# Patient Record
Sex: Male | Born: 1945 | Race: White | Hispanic: No | Marital: Married | Smoking: Never smoker
Health system: Southern US, Community
[De-identification: ages and names within clinical notes are randomized; demographics above are authoritative.]

## PROBLEM LIST (undated history)

## (undated) DIAGNOSIS — M51369 Other intervertebral disc degeneration, lumbar region without mention of lumbar back pain or lower extremity pain: Secondary | ICD-10-CM

## (undated) DIAGNOSIS — N480 Leukoplakia of penis: Secondary | ICD-10-CM

## (undated) DIAGNOSIS — N35914 Unspecified anterior urethral stricture, male: Secondary | ICD-10-CM

## (undated) DIAGNOSIS — Z950 Presence of cardiac pacemaker: Secondary | ICD-10-CM

## (undated) DIAGNOSIS — I442 Atrioventricular block, complete: Secondary | ICD-10-CM

## (undated) DIAGNOSIS — G2581 Restless legs syndrome: Secondary | ICD-10-CM

## (undated) DIAGNOSIS — R310 Gross hematuria: Secondary | ICD-10-CM

## (undated) DIAGNOSIS — I2699 Other pulmonary embolism without acute cor pulmonale: Secondary | ICD-10-CM

## (undated) DIAGNOSIS — I82409 Acute embolism and thrombosis of unspecified deep veins of unspecified lower extremity: Secondary | ICD-10-CM

## (undated) DIAGNOSIS — G629 Polyneuropathy, unspecified: Secondary | ICD-10-CM

## (undated) DIAGNOSIS — F32A Depression, unspecified: Secondary | ICD-10-CM

## (undated) DIAGNOSIS — E785 Hyperlipidemia, unspecified: Secondary | ICD-10-CM

## (undated) DIAGNOSIS — N471 Phimosis: Secondary | ICD-10-CM

## (undated) DIAGNOSIS — M5126 Other intervertebral disc displacement, lumbar region: Secondary | ICD-10-CM

## (undated) DIAGNOSIS — I10 Essential (primary) hypertension: Secondary | ICD-10-CM

## (undated) DIAGNOSIS — K648 Other hemorrhoids: Secondary | ICD-10-CM

## (undated) DIAGNOSIS — I951 Orthostatic hypotension: Secondary | ICD-10-CM

## (undated) DIAGNOSIS — K2960 Other gastritis without bleeding: Secondary | ICD-10-CM

## (undated) DIAGNOSIS — G4730 Sleep apnea, unspecified: Secondary | ICD-10-CM

## (undated) DIAGNOSIS — F329 Major depressive disorder, single episode, unspecified: Secondary | ICD-10-CM

## (undated) DIAGNOSIS — K219 Gastro-esophageal reflux disease without esophagitis: Secondary | ICD-10-CM

## (undated) DIAGNOSIS — M5136 Other intervertebral disc degeneration, lumbar region: Secondary | ICD-10-CM

## (undated) DIAGNOSIS — F419 Anxiety disorder, unspecified: Secondary | ICD-10-CM

## (undated) DIAGNOSIS — E119 Type 2 diabetes mellitus without complications: Secondary | ICD-10-CM

## (undated) DIAGNOSIS — K2210 Ulcer of esophagus without bleeding: Secondary | ICD-10-CM

## (undated) DIAGNOSIS — M1990 Unspecified osteoarthritis, unspecified site: Secondary | ICD-10-CM

## (undated) HISTORY — PX: OTHER SURGICAL HISTORY: SHX169

## (undated) HISTORY — DX: Leukoplakia of penis: N48.0

## (undated) HISTORY — PX: EYE SURGERY: SHX253

## (undated) HISTORY — PX: JOINT REPLACEMENT: SHX530

## (undated) HISTORY — DX: Gross hematuria: R31.0

## (undated) HISTORY — DX: Atrioventricular block, complete: I44.2

## (undated) HISTORY — PX: PILONIDAL CYST EXCISION: SHX744

## (undated) HISTORY — DX: Unspecified anterior urethral stricture, male: N35.914

## (undated) HISTORY — PX: CARDIAC CATHETERIZATION: SHX172

## (undated) HISTORY — PX: UPPER GI ENDOSCOPY: SHX6162

## (undated) HISTORY — PX: TOTAL KNEE ARTHROPLASTY: SHX125

## (undated) HISTORY — PX: CATARACT EXTRACTION W/ INTRAOCULAR LENS  IMPLANT, BILATERAL: SHX1307

## (undated) HISTORY — DX: Phimosis: N47.1

---
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
## 2002-01-23 HISTORY — PX: TOTAL HIP ARTHROPLASTY: SHX124

## 2004-01-24 HISTORY — PX: TOTAL HIP ARTHROPLASTY: SHX124

## 2004-01-29 ENCOUNTER — Ambulatory Visit: Payer: Self-pay | Admitting: Gastroenterology

## 2004-05-27 ENCOUNTER — Encounter: Admission: RE | Admit: 2004-05-27 | Discharge: 2004-05-27 | Payer: Self-pay | Admitting: Family Medicine

## 2005-12-28 ENCOUNTER — Other Ambulatory Visit: Payer: Self-pay

## 2006-01-01 ENCOUNTER — Inpatient Hospital Stay: Payer: Self-pay | Admitting: Specialist

## 2006-11-01 ENCOUNTER — Ambulatory Visit: Payer: Self-pay | Admitting: Cardiovascular Disease

## 2010-02-11 ENCOUNTER — Encounter
Admission: RE | Admit: 2010-02-11 | Discharge: 2010-02-11 | Payer: Self-pay | Source: Home / Self Care | Attending: Specialist | Admitting: Specialist

## 2010-03-01 ENCOUNTER — Ambulatory Visit: Payer: Self-pay | Admitting: Specialist

## 2010-03-08 ENCOUNTER — Ambulatory Visit: Payer: Self-pay | Admitting: Specialist

## 2011-03-21 DIAGNOSIS — H2510 Age-related nuclear cataract, unspecified eye: Secondary | ICD-10-CM | POA: Diagnosis not present

## 2011-08-01 DIAGNOSIS — R42 Dizziness and giddiness: Secondary | ICD-10-CM | POA: Diagnosis not present

## 2011-08-01 DIAGNOSIS — E7800 Pure hypercholesterolemia, unspecified: Secondary | ICD-10-CM | POA: Diagnosis not present

## 2011-08-01 DIAGNOSIS — R0602 Shortness of breath: Secondary | ICD-10-CM | POA: Diagnosis not present

## 2011-08-01 DIAGNOSIS — E8881 Metabolic syndrome: Secondary | ICD-10-CM | POA: Diagnosis not present

## 2011-08-01 DIAGNOSIS — R072 Precordial pain: Secondary | ICD-10-CM | POA: Diagnosis not present

## 2011-08-01 DIAGNOSIS — I10 Essential (primary) hypertension: Secondary | ICD-10-CM | POA: Diagnosis not present

## 2011-08-03 DIAGNOSIS — R079 Chest pain, unspecified: Secondary | ICD-10-CM | POA: Diagnosis not present

## 2011-08-03 DIAGNOSIS — E785 Hyperlipidemia, unspecified: Secondary | ICD-10-CM | POA: Diagnosis not present

## 2011-08-03 DIAGNOSIS — I10 Essential (primary) hypertension: Secondary | ICD-10-CM | POA: Diagnosis not present

## 2011-08-03 DIAGNOSIS — R011 Cardiac murmur, unspecified: Secondary | ICD-10-CM | POA: Diagnosis not present

## 2011-08-09 DIAGNOSIS — R072 Precordial pain: Secondary | ICD-10-CM | POA: Diagnosis not present

## 2011-08-10 DIAGNOSIS — I10 Essential (primary) hypertension: Secondary | ICD-10-CM | POA: Diagnosis not present

## 2011-08-10 DIAGNOSIS — G4730 Sleep apnea, unspecified: Secondary | ICD-10-CM | POA: Diagnosis not present

## 2011-08-10 DIAGNOSIS — E785 Hyperlipidemia, unspecified: Secondary | ICD-10-CM | POA: Diagnosis not present

## 2011-08-10 DIAGNOSIS — R609 Edema, unspecified: Secondary | ICD-10-CM | POA: Diagnosis not present

## 2011-09-07 DIAGNOSIS — I10 Essential (primary) hypertension: Secondary | ICD-10-CM | POA: Diagnosis not present

## 2011-09-07 DIAGNOSIS — E7800 Pure hypercholesterolemia, unspecified: Secondary | ICD-10-CM | POA: Diagnosis not present

## 2011-09-07 DIAGNOSIS — E119 Type 2 diabetes mellitus without complications: Secondary | ICD-10-CM | POA: Diagnosis not present

## 2012-03-04 DIAGNOSIS — I10 Essential (primary) hypertension: Secondary | ICD-10-CM | POA: Diagnosis not present

## 2012-03-04 DIAGNOSIS — Z0000 Encounter for general adult medical examination without abnormal findings: Secondary | ICD-10-CM | POA: Diagnosis not present

## 2012-03-04 DIAGNOSIS — E8881 Metabolic syndrome: Secondary | ICD-10-CM | POA: Diagnosis not present

## 2012-03-04 DIAGNOSIS — E785 Hyperlipidemia, unspecified: Secondary | ICD-10-CM | POA: Diagnosis not present

## 2012-03-14 DIAGNOSIS — H43819 Vitreous degeneration, unspecified eye: Secondary | ICD-10-CM | POA: Diagnosis not present

## 2012-03-14 DIAGNOSIS — Z961 Presence of intraocular lens: Secondary | ICD-10-CM | POA: Diagnosis not present

## 2012-08-20 DIAGNOSIS — M545 Low back pain: Secondary | ICD-10-CM | POA: Diagnosis not present

## 2012-08-20 DIAGNOSIS — R350 Frequency of micturition: Secondary | ICD-10-CM | POA: Diagnosis not present

## 2012-08-20 DIAGNOSIS — N390 Urinary tract infection, site not specified: Secondary | ICD-10-CM | POA: Diagnosis not present

## 2012-09-02 DIAGNOSIS — I10 Essential (primary) hypertension: Secondary | ICD-10-CM | POA: Diagnosis not present

## 2012-09-02 DIAGNOSIS — N390 Urinary tract infection, site not specified: Secondary | ICD-10-CM | POA: Diagnosis not present

## 2012-09-02 DIAGNOSIS — E785 Hyperlipidemia, unspecified: Secondary | ICD-10-CM | POA: Diagnosis not present

## 2012-10-02 ENCOUNTER — Ambulatory Visit: Payer: Self-pay

## 2012-10-02 DIAGNOSIS — Z96649 Presence of unspecified artificial hip joint: Secondary | ICD-10-CM | POA: Diagnosis not present

## 2012-10-02 DIAGNOSIS — R319 Hematuria, unspecified: Secondary | ICD-10-CM | POA: Diagnosis not present

## 2012-10-02 DIAGNOSIS — R310 Gross hematuria: Secondary | ICD-10-CM | POA: Diagnosis not present

## 2012-10-14 DIAGNOSIS — R300 Dysuria: Secondary | ICD-10-CM | POA: Diagnosis not present

## 2012-10-14 DIAGNOSIS — R319 Hematuria, unspecified: Secondary | ICD-10-CM | POA: Diagnosis not present

## 2012-10-14 DIAGNOSIS — N478 Other disorders of prepuce: Secondary | ICD-10-CM | POA: Diagnosis not present

## 2012-10-29 ENCOUNTER — Ambulatory Visit: Payer: Self-pay | Admitting: Specialist

## 2012-10-29 DIAGNOSIS — M25569 Pain in unspecified knee: Secondary | ICD-10-CM | POA: Diagnosis not present

## 2012-10-29 DIAGNOSIS — M2550 Pain in unspecified joint: Secondary | ICD-10-CM | POA: Diagnosis not present

## 2012-10-29 DIAGNOSIS — M25559 Pain in unspecified hip: Secondary | ICD-10-CM | POA: Diagnosis not present

## 2012-10-29 DIAGNOSIS — Z96649 Presence of unspecified artificial hip joint: Secondary | ICD-10-CM | POA: Diagnosis not present

## 2012-12-10 DIAGNOSIS — Z23 Encounter for immunization: Secondary | ICD-10-CM | POA: Diagnosis not present

## 2012-12-31 DIAGNOSIS — R319 Hematuria, unspecified: Secondary | ICD-10-CM | POA: Diagnosis not present

## 2012-12-31 DIAGNOSIS — N478 Other disorders of prepuce: Secondary | ICD-10-CM | POA: Diagnosis not present

## 2013-03-13 DIAGNOSIS — M1710 Unilateral primary osteoarthritis, unspecified knee: Secondary | ICD-10-CM | POA: Diagnosis not present

## 2013-03-27 DIAGNOSIS — R109 Unspecified abdominal pain: Secondary | ICD-10-CM | POA: Diagnosis not present

## 2013-04-07 ENCOUNTER — Inpatient Hospital Stay (HOSPITAL_COMMUNITY): Payer: BC Managed Care – PPO

## 2013-04-07 ENCOUNTER — Inpatient Hospital Stay (HOSPITAL_COMMUNITY)
Admission: AD | Admit: 2013-04-07 | Discharge: 2013-04-11 | DRG: 176 | Disposition: A | Discharge status: Home / Self Care | Payer: BC Managed Care – PPO | Source: Other Acute Inpatient Hospital | Attending: Pulmonary Disease | Admitting: Pulmonary Disease

## 2013-04-07 ENCOUNTER — Emergency Department: Payer: Self-pay | Admitting: Emergency Medicine

## 2013-04-07 DIAGNOSIS — I2699 Other pulmonary embolism without acute cor pulmonale: Principal | ICD-10-CM | POA: Diagnosis present

## 2013-04-07 DIAGNOSIS — R0789 Other chest pain: Secondary | ICD-10-CM | POA: Diagnosis not present

## 2013-04-07 DIAGNOSIS — Z79899 Other long term (current) drug therapy: Secondary | ICD-10-CM | POA: Diagnosis not present

## 2013-04-07 DIAGNOSIS — R000 Tachycardia, unspecified: Secondary | ICD-10-CM

## 2013-04-07 DIAGNOSIS — Z96649 Presence of unspecified artificial hip joint: Secondary | ICD-10-CM | POA: Diagnosis not present

## 2013-04-07 DIAGNOSIS — I517 Cardiomegaly: Secondary | ICD-10-CM | POA: Diagnosis not present

## 2013-04-07 DIAGNOSIS — I82409 Acute embolism and thrombosis of unspecified deep veins of unspecified lower extremity: Secondary | ICD-10-CM

## 2013-04-07 DIAGNOSIS — I498 Other specified cardiac arrhythmias: Secondary | ICD-10-CM | POA: Diagnosis not present

## 2013-04-07 DIAGNOSIS — M1710 Unilateral primary osteoarthritis, unspecified knee: Secondary | ICD-10-CM | POA: Diagnosis present

## 2013-04-07 DIAGNOSIS — R0989 Other specified symptoms and signs involving the circulatory and respiratory systems: Secondary | ICD-10-CM | POA: Diagnosis not present

## 2013-04-07 DIAGNOSIS — R0602 Shortness of breath: Secondary | ICD-10-CM | POA: Diagnosis not present

## 2013-04-07 DIAGNOSIS — I10 Essential (primary) hypertension: Secondary | ICD-10-CM | POA: Diagnosis not present

## 2013-04-07 DIAGNOSIS — Z6841 Body Mass Index (BMI) 40.0 and over, adult: Secondary | ICD-10-CM

## 2013-04-07 DIAGNOSIS — R0902 Hypoxemia: Secondary | ICD-10-CM | POA: Diagnosis not present

## 2013-04-07 DIAGNOSIS — IMO0002 Reserved for concepts with insufficient information to code with codable children: Secondary | ICD-10-CM

## 2013-04-07 DIAGNOSIS — E875 Hyperkalemia: Secondary | ICD-10-CM | POA: Diagnosis present

## 2013-04-07 HISTORY — DX: Anxiety disorder, unspecified: F41.9

## 2013-04-07 HISTORY — DX: Depression, unspecified: F32.A

## 2013-04-07 HISTORY — DX: Other intervertebral disc degeneration, lumbar region: M51.36

## 2013-04-07 HISTORY — DX: Essential (primary) hypertension: I10

## 2013-04-07 HISTORY — DX: Other intervertebral disc degeneration, lumbar region without mention of lumbar back pain or lower extremity pain: M51.369

## 2013-04-07 HISTORY — DX: Acute embolism and thrombosis of unspecified deep veins of unspecified lower extremity: I82.409

## 2013-04-07 HISTORY — DX: Unspecified osteoarthritis, unspecified site: M19.90

## 2013-04-07 HISTORY — DX: Major depressive disorder, single episode, unspecified: F32.9

## 2013-04-07 HISTORY — DX: Other intervertebral disc displacement, lumbar region: M51.26

## 2013-04-07 HISTORY — DX: Other pulmonary embolism without acute cor pulmonale: I26.99

## 2013-04-07 LAB — CBC
HCT: 43.6 % (ref 40.0–52.0)
HGB: 14.8 g/dL (ref 13.0–18.0)
MCH: 31.9 pg (ref 26.0–34.0)
MCHC: 33.9 g/dL (ref 32.0–36.0)
MCV: 94 fL (ref 80–100)
Platelet: 131 10*3/uL — ABNORMAL LOW (ref 150–440)
RBC: 4.64 10*6/uL (ref 4.40–5.90)
RDW: 13.3 % (ref 11.5–14.5)
WBC: 9.4 10*3/uL (ref 3.8–10.6)

## 2013-04-07 LAB — CBC WITH DIFFERENTIAL/PLATELET
BASOS PCT: 0 % (ref 0–1)
Basophils Absolute: 0 10*3/uL (ref 0.0–0.1)
Eosinophils Absolute: 0.2 10*3/uL (ref 0.0–0.7)
Eosinophils Relative: 3 % (ref 0–5)
HEMATOCRIT: 39.4 % (ref 39.0–52.0)
Hemoglobin: 13.8 g/dL (ref 13.0–17.0)
LYMPHS PCT: 22 % (ref 12–46)
Lymphs Abs: 1.6 10*3/uL (ref 0.7–4.0)
MCH: 31.9 pg (ref 26.0–34.0)
MCHC: 35 g/dL (ref 30.0–36.0)
MCV: 91.2 fL (ref 78.0–100.0)
MONO ABS: 0.5 10*3/uL (ref 0.1–1.0)
MONOS PCT: 7 % (ref 3–12)
Neutro Abs: 5.1 10*3/uL (ref 1.7–7.7)
Neutrophils Relative %: 68 % (ref 43–77)
Platelets: 139 10*3/uL — ABNORMAL LOW (ref 150–400)
RBC: 4.32 MIL/uL (ref 4.22–5.81)
RDW: 13.5 % (ref 11.5–15.5)
WBC: 7.5 10*3/uL (ref 4.0–10.5)

## 2013-04-07 LAB — HEPATIC FUNCTION PANEL
ALT: 16 U/L (ref 0–53)
AST: 23 U/L (ref 0–37)
Albumin: 3.8 g/dL (ref 3.5–5.2)
Alkaline Phosphatase: 38 U/L — ABNORMAL LOW (ref 39–117)
BILIRUBIN TOTAL: 0.7 mg/dL (ref 0.3–1.2)
Total Protein: 6.6 g/dL (ref 6.0–8.3)

## 2013-04-07 LAB — PRO B NATRIURETIC PEPTIDE: Pro B Natriuretic peptide (BNP): 1217 pg/mL — ABNORMAL HIGH (ref 0–125)

## 2013-04-07 LAB — COMPREHENSIVE METABOLIC PANEL
ALK PHOS: 45 U/L
ALT: 22 U/L (ref 12–78)
ALT: 15 U/L (ref 0–53)
AST: 22 U/L (ref 0–37)
Albumin: 4.3 g/dL (ref 3.4–5.0)
Albumin: 3.7 g/dL (ref 3.5–5.2)
Alkaline Phosphatase: 38 U/L — ABNORMAL LOW (ref 39–117)
Anion Gap: 4 — ABNORMAL LOW (ref 7–16)
BILIRUBIN TOTAL: 0.7 mg/dL (ref 0.3–1.2)
BUN: 28 mg/dL — ABNORMAL HIGH (ref 7–18)
BUN: 21 mg/dL (ref 6–23)
Bilirubin,Total: 0.5 mg/dL (ref 0.2–1.0)
CALCIUM: 8.8 mg/dL (ref 8.5–10.1)
CHLORIDE: 105 mmol/L (ref 98–107)
CO2: 24 meq/L (ref 19–32)
CREATININE: 0.97 mg/dL (ref 0.50–1.35)
Calcium: 9.2 mg/dL (ref 8.4–10.5)
Chloride: 99 mEq/L (ref 96–112)
Co2: 28 mmol/L (ref 21–32)
Creatinine: 1.26 mg/dL (ref 0.60–1.30)
EGFR (African American): >60
GFR CALC NON AF AMER: 59 — AB
GFR calc Af Amer: >90 mL/min (ref >90)
GFR, EST NON AFRICAN AMERICAN: 84 mL/min — AB (ref >90)
Glucose, Bld: 134 mg/dL — ABNORMAL HIGH (ref 70–99)
Glucose: 126 mg/dL — ABNORMAL HIGH (ref 65–99)
OSMOLALITY: 281 (ref 275–301)
Potassium: 4.2 mmol/L (ref 3.5–5.1)
Potassium: 4 mEq/L (ref 3.7–5.3)
SGOT(AST): 28 U/L (ref 15–37)
Sodium: 137 mmol/L (ref 136–145)
Sodium: 139 mEq/L (ref 137–147)
TOTAL PROTEIN: 7.6 g/dL (ref 6.4–8.2)
Total Protein: 6.6 g/dL (ref 6.0–8.3)

## 2013-04-07 LAB — ANTITHROMBIN III: AntiThromb III Func: 81 % (ref 75–120)

## 2013-04-07 LAB — APTT
ACTIVATED PTT: 25.8 s (ref 23.6–35.9)
APTT: 105 s — AB (ref 24–37)

## 2013-04-07 LAB — PROTIME-INR
INR: 0.9
INR: 1.1 (ref 0.00–1.49)
PROTHROMBIN TIME: 12.4 s (ref 11.5–14.7)
PROTHROMBIN TIME: 14 s (ref 11.6–15.2)

## 2013-04-07 LAB — TROPONIN I
Troponin I: 0.38 ng/mL (ref <0.30)
Troponin-I: 0.15 ng/mL — ABNORMAL HIGH

## 2013-04-07 LAB — GLUCOSE, CAPILLARY: Glucose-Capillary: 162 mg/dL — ABNORMAL HIGH (ref 70–99)

## 2013-04-07 LAB — MRSA PCR SCREENING: MRSA BY PCR: NEGATIVE

## 2013-04-07 LAB — HEPARIN LEVEL (UNFRACTIONATED): HEPARIN UNFRACTIONATED: 0.82 [IU]/mL — AB (ref 0.30–0.70)

## 2013-04-07 MED ORDER — GABAPENTIN 600 MG PO TABS
300.0000 mg | ORAL_TABLET | Freq: Three times a day (TID) | ORAL | Status: DC
  Filled 2013-04-07: qty 0.5

## 2013-04-07 MED ORDER — FOLIC ACID 1 MG PO TABS
1.0000 mg | ORAL_TABLET | Freq: Every day | ORAL | Status: DC
  Administered 2013-04-07 – 2013-04-11 (×5): 1 mg via ORAL
  Filled 2013-04-07 (×6): qty 1

## 2013-04-07 MED ORDER — HYDROCODONE-ACETAMINOPHEN 5-325 MG PO TABS
1.0000 | ORAL_TABLET | Freq: Four times a day (QID) | ORAL | Status: DC | PRN
  Administered 2013-04-07 – 2013-04-09 (×2): 1 via ORAL
  Filled 2013-04-07 (×3): qty 1

## 2013-04-07 MED ORDER — ASPIRIN 81 MG PO CHEW
81.0000 mg | CHEWABLE_TABLET | Freq: Every day | ORAL | Status: DC
  Administered 2013-04-07 – 2013-04-11 (×5): 81 mg via ORAL
  Filled 2013-04-07 (×5): qty 1

## 2013-04-07 MED ORDER — QUETIAPINE FUMARATE ER 50 MG PO TB24
150.0000 mg | ORAL_TABLET | Freq: Every day | ORAL | Status: DC
  Administered 2013-04-07 – 2013-04-08 (×2): 150 mg via ORAL
  Filled 2013-04-07 (×5): qty 3

## 2013-04-07 MED ORDER — HEPARIN (PORCINE) IN NACL 100-0.45 UNIT/ML-% IJ SOLN
1800.0000 [IU]/h | INTRAMUSCULAR | Status: AC
  Administered 2013-04-07: 1900 [IU]/h via INTRAVENOUS
  Administered 2013-04-09: 1600 [IU]/h via INTRAVENOUS
  Administered 2013-04-09 – 2013-04-10 (×2): 1800 [IU]/h via INTRAVENOUS
  Filled 2013-04-07 (×7): qty 250

## 2013-04-07 MED ORDER — GABAPENTIN 300 MG PO CAPS
300.0000 mg | ORAL_CAPSULE | Freq: Three times a day (TID) | ORAL | Status: DC
  Administered 2013-04-07 – 2013-04-11 (×12): 300 mg via ORAL
  Filled 2013-04-07 (×14): qty 1

## 2013-04-07 MED ORDER — ADULT MULTIVITAMIN W/MINERALS CH
1.0000 | ORAL_TABLET | Freq: Every day | ORAL | Status: DC
  Administered 2013-04-07 – 2013-04-11 (×5): 1 via ORAL
  Filled 2013-04-07 (×5): qty 1

## 2013-04-07 MED ORDER — VITAMIN B-1 100 MG PO TABS
100.0000 mg | ORAL_TABLET | Freq: Every day | ORAL | Status: DC
  Administered 2013-04-07 – 2013-04-11 (×5): 100 mg via ORAL
  Filled 2013-04-07 (×5): qty 1

## 2013-04-07 NOTE — Progress Notes (Signed)
ANTICOAGULATION CONSULT NOTE - Initial Consult  Pharmacy Consult for heparin Indication: pulmonary embolus  Not on File  Patient Measurements:   Heparin Dosing Weight: 108kg  Vital Signs: Temp: 99.3 F (37.4 C) (03/16 1900) Temp src: Oral (03/16 1900) BP: 93/78 mmHg (03/16 2000) Pulse Rate: 99 (03/16 2000)  Labs:  Recent Labs  04/07/13 2024  HGB 13.8  HCT 39.4  PLT 139*  HEPARINUNFRC 0.82*    CrCl is unknown because no creatinine reading has been taken and the patient has no height on file.   Medical History: No past medical history on file.  Medications:  Prescriptions prior to admission  Medication Sig Dispense Refill  . aspirin 81 MG tablet Take 81 mg by mouth daily.      . AZOR 10-40 MG per tablet Take 1 tablet by mouth daily.      Marland Kitchen gabapentin (NEURONTIN) 300 MG capsule Take 1 capsule by mouth 3 (three) times daily.      Marland Kitchen HYDROcodone-acetaminophen (NORCO/VICODIN) 5-325 MG per tablet Take 1 tablet by mouth 2 (two) times daily as needed.      . hyoscyamine (ANASPAZ) 0.125 MG TBDP disintergrating tablet Take 1 tablet by mouth 4 (four) times daily as needed.      . meloxicam (MOBIC) 15 MG tablet Take 1 tablet by mouth daily.      . Multiple Vitamin (MULTIVITAMIN) tablet Take 1 tablet by mouth daily.      Marland Kitchen omeprazole (PRILOSEC) 40 MG capsule Take 1 capsule by mouth daily.      . SEROQUEL XR 150 MG 24 hr tablet Take 1 tablet by mouth daily.      . simvastatin (ZOCOR) 20 MG tablet Take 1 tablet by mouth daily.      . Venlafaxine HCl 225 MG TB24 Take 1 tablet by mouth daily.      . VESICARE 10 MG tablet Take 1 tablet by mouth daily.        Assessment: 89 yom presented to [facility] with SOB. Found to have a saddle PE with evidence of R heart strain. Initiated on aggressive heparin dosing at [facility] and transferred to [facility]. Unclear exactly when heparin was started based on documentation from [facility] but appears to have been started between 1pm and 2pm. An approximate 6 hour  heparin level was checked upon arrival which resulted as supratherapeutic at 0.82. No bleeding noted. H/H is WNL but plts are slightly low. This is similar to his baseline labs checked at [facility] (H/H 14.8/43.6, plts 131, INR 0.9).   Goal of Therapy:  Heparin level 0.3-0.7 units/ml Monitor platelets by anticoagulation protocol: Yes   Plan:  1. Reduce heparin gtt to 1900 units/hr 2. Check an 8 hour heparin level 3. Daily heparin level and CBC 4. F/u plans for oral anticoagulation  Jeremian Whitby, Rande Lawman 04/07/2013,9:50 PM

## 2013-04-07 NOTE — H&P (Addendum)
PULMONARY / CRITICAL CARE MEDICINE   Name: Nicholas [name] MRN: [id number] DOB: 11-Sep-1945    ADMISSION DATE:  04/07/2013 CONSULTATION DATE:  04/07/2013  REFERRING MD :  [facility] PRIMARY SERVICE: PCCM  CHIEF COMPLAINT:  SOB  BRIEF PATIENT DESCRIPTION: 68 y.o. M transferred to [facility] ICU from [facility] with PE. PCCM to admit for catheter directed tPA that was recommended by Dr. Jeronimo Norma.  Patient refused it.  SIGNIFICANT EVENTS / STUDIES:  3/16 >>> presented to [facility] with SOB, found to have PE, transferred to [facility] ICU.  CTA showed large acute PE on R side with saddle embolus and filling defect, smaller PE in RLL, evidence of right heart strain.  LINES / TUBES: PIV  CULTURES: None  ANTIBIOTICS: None  HISTORY OF PRESENT ILLNESS:  Mr. [name] is a 68 y.o. M who was seen at [facility] after he suddenly developed SOB while playing golf earlier today.  Workup in ED revealed acute PE.  Pt transferred to [facility] ICU. Pt denies any recent plane rides, long car trips, long periods of immobility.  No known clotting disorders, no family history of clotting disorders.  No history of Cancer.  No recent weight loss. States that his SOB is somewhat improved but not fully.  Denies chest pain, wheezing, hemoptysis, leg pain/swelling, abdominal pain, N/V/D.   PAST MEDICAL HISTORY :  No past medical history on file. Chronic hip and knee pain and chronic knee arthritis. No past surgical history on file. Bilateral hip replacement. Prior to Admission medications   Not on File  COX2 inhibitor, gabapentin, seroquel and "pain pill" Not on File  FAMILY HISTORY:  No family history on file. Negative for blood clots. SOCIAL HISTORY:  has no tobacco, alcohol, and drug history on file. Never smoked, occasional alcohol (but his family seems to disagree but not directly) and no drug use.  REVIEW OF SYSTEMS:  Negative except as stated in HPI.  SUBJECTIVE: SOB is improved with O2.  Having some mild chest tightness but no true chest pain.  VITAL  SIGNS:  HR 100, BP 137/88, sat of 96% on 5 liters, RR 16-20.  Afebrile. HEMODYNAMICS:   VENTILATOR SETTINGS:   INTAKE / OUTPUT: Intake/Output   None     PHYSICAL EXAMINATION: General: Pleasant WDWN male, resting in bed, in NAD. Neuro: A&O x 3, non-focal.  HEENT: [place]/AT. PERRL, sclerae anicteric. Cardiovascular: RRR, no M/R/G.  Lungs: Respirations even and unlabored.  CTA bilaterally, No W/R/R.  Abdomen: BS x 4, soft, NT/ND.  Musculoskeletal: No gross deformities, no edema.  Skin: Intact, warm, no rashes.  LABS:  CBC No results found for this basename: WBC, HGB, HCT, PLT,  in the last 168 hours Coag's No results found for this basename: APTT, INR,  in the last 168 hours BMET No results found for this basename: NA, K, CL, CO2, BUN, CREATININE, GLUCOSE,  in the last 168 hours Electrolytes No results found for this basename: CALCIUM, MG, PHOS,  in the last 168 hours Sepsis Markers No results found for this basename: LATICACIDVEN, PROCALCITON, O2SATVEN,  in the last 168 hours ABG No results found for this basename: PHART, PCO2ART, PO2ART,  in the last 168 hours Liver Enzymes No results found for this basename: AST, ALT, ALKPHOS, BILITOT, ALBUMIN,  in the last 168 hours Cardiac Enzymes No results found for this basename: TROPONINI, PROBNP,  in the last 168 hours Glucose No results found for this basename: GLUCAP,  in the last 168 hours  Imaging No results found.  ASSESSMENT / PLAN:  PULMONARY A: Acute PE.  Treatment options discussed at length with patient, he does not want catheter directed tPA, wishes only for anticoagulation since he feels so much better already and wishes for coumadin over xeralto. P:   - Full anticoagulation as below. - Supplemental O2 to maintain SpO2 > 93%. - IS and flutter valve. - LE dopplers. - Send hypercoagulability panel but will need to be repeated as patient is already on heparin.  CARDIOVASCULAR A: Troponin Bump (0.15 at  [facility]) P:  - Trend troponins. - Echo. - EKG. - ASA and heparin.  RENAL A: At risk for contrast induced nephropathy. P:   - Monitor BMET. - Replace electrolytes as indicated.  GASTROINTESTINAL A:  Diet P:   - SUP not indicated. - Heart healthy diet. - F/u LFT's  HEMATOLOGIC A: DVT prophylaxis P:  - Heparin drip x 2 days, then Coumadin bridge for at least 3 days. - Monitor CBC.  INFECTIOUS A:  No acute issues. P:   - Monitor WBC's/fever curve.  ENDOCRINE A:   No acute issues.  No history of diabetes. P:   - Monitor.  NEUROLOGIC A:   No acute issues.  Denies alcohol use emphatically. P:   - Monitor for signs of etoh withdrawal. - Will give thiamine, folate and MVI regardless.  Montey Hora, PA - [facility] Pgr: [phone]  or [phone]  I have personally obtained a history, examined the patient, evaluated laboratory and imaging results, formulated the assessment and plan and placed orders.  Rush Farmer, M.D. [facility] Pulmonary/Critical Care Medicine. Pager: [phone]. After hours pager: [phone].

## 2013-04-08 ENCOUNTER — Inpatient Hospital Stay (HOSPITAL_COMMUNITY): Payer: BC Managed Care – PPO

## 2013-04-08 DIAGNOSIS — I498 Other specified cardiac arrhythmias: Secondary | ICD-10-CM | POA: Diagnosis not present

## 2013-04-08 DIAGNOSIS — R0902 Hypoxemia: Secondary | ICD-10-CM | POA: Diagnosis not present

## 2013-04-08 DIAGNOSIS — I2699 Other pulmonary embolism without acute cor pulmonale: Secondary | ICD-10-CM

## 2013-04-08 DIAGNOSIS — I517 Cardiomegaly: Secondary | ICD-10-CM | POA: Diagnosis not present

## 2013-04-08 LAB — BETA-2-GLYCOPROTEIN I ABS, IGG/M/A
BETA-2-GLYCOPROTEIN I IGA: 7 A Units (ref <20)
Beta-2 Glyco I IgG: 4 G Units (ref <20)
Beta-2-Glycoprotein I IgM: 12 M Units (ref <20)

## 2013-04-08 LAB — BASIC METABOLIC PANEL
BUN: 19 mg/dL (ref 6–23)
CALCIUM: 9.3 mg/dL (ref 8.4–10.5)
CO2: 29 mEq/L (ref 19–32)
CREATININE: 1.06 mg/dL (ref 0.50–1.35)
Chloride: 101 mEq/L (ref 96–112)
GFR, EST AFRICAN AMERICAN: 82 mL/min — AB (ref >90)
GFR, EST NON AFRICAN AMERICAN: 71 mL/min — AB (ref >90)
Glucose, Bld: 125 mg/dL — ABNORMAL HIGH (ref 70–99)
Potassium: 4.8 mEq/L (ref 3.7–5.3)
SODIUM: 141 meq/L (ref 137–147)

## 2013-04-08 LAB — LUPUS ANTICOAGULANT PANEL
DRVVT: 30.9 secs (ref <42.9)
Lupus Anticoagulant: NOT DETECTED
PTT LA: 88.4 s — AB (ref 28.0–43.0)
PTTLA 41 MIX: 89.9 s — AB (ref 28.0–43.0)
PTTLA CONFIRMATION: 0 s (ref <8.0)

## 2013-04-08 LAB — HEPARIN LEVEL (UNFRACTIONATED)
HEPARIN UNFRACTIONATED: 0.57 [IU]/mL (ref 0.30–0.70)
HEPARIN UNFRACTIONATED: 0.41 [IU]/mL (ref 0.30–0.70)
Heparin Unfractionated: 0.9 IU/mL — ABNORMAL HIGH (ref 0.30–0.70)

## 2013-04-08 LAB — HOMOCYSTEINE: HOMOCYSTEINE-NORM: 11.3 umol/L (ref 4.0–15.4)

## 2013-04-08 LAB — CARDIOLIPIN ANTIBODIES, IGG, IGM, IGA
ANTICARDIOLIPIN IGG: 3 GPL U/mL — AB (ref <23)
Anticardiolipin IgA: 5 APL U/mL — ABNORMAL LOW (ref <22)
Anticardiolipin IgM: 2 MPL U/mL — ABNORMAL LOW (ref <11)

## 2013-04-08 LAB — TROPONIN I
Troponin I: 0.3 ng/mL (ref <0.30)
Troponin I: <0.3 ng/mL (ref <0.30)

## 2013-04-08 LAB — CBC
HCT: 39.9 % (ref 39.0–52.0)
Hemoglobin: 13.8 g/dL (ref 13.0–17.0)
MCH: 32.2 pg (ref 26.0–34.0)
MCHC: 34.6 g/dL (ref 30.0–36.0)
MCV: 93 fL (ref 78.0–100.0)
PLATELETS: 121 10*3/uL — AB (ref 150–400)
RBC: 4.29 MIL/uL (ref 4.22–5.81)
RDW: 13.5 % (ref 11.5–15.5)
WBC: 5.7 10*3/uL (ref 4.0–10.5)

## 2013-04-08 LAB — PROTEIN S ACTIVITY: PROTEIN S ACTIVITY: 111 % (ref 69–129)

## 2013-04-08 LAB — PROTEIN C ACTIVITY: Protein C Activity: 200 % — ABNORMAL HIGH (ref 75–133)

## 2013-04-08 LAB — PHOSPHORUS: PHOSPHORUS: 3.4 mg/dL (ref 2.3–4.6)

## 2013-04-08 LAB — MAGNESIUM: MAGNESIUM: 2.3 mg/dL (ref 1.5–2.5)

## 2013-04-08 MED ORDER — WARFARIN SODIUM 7.5 MG PO TABS
7.5000 mg | ORAL_TABLET | Freq: Once | ORAL | Status: AC
Start: 2013-04-08 — End: 2013-04-08
  Administered 2013-04-08: 7.5 mg via ORAL
  Filled 2013-04-08: qty 1

## 2013-04-08 MED ORDER — WARFARIN - PHARMACIST DOSING INPATIENT
Freq: Every day | Status: DC
  Administered 2013-04-09: 18:00:00

## 2013-04-08 NOTE — Progress Notes (Signed)
  Echocardiogram 2D Echocardiogram has been performed.  Basilia Jumbo 04/08/2013, 12:48 PM

## 2013-04-08 NOTE — Progress Notes (Signed)
ANTICOAGULATION CONSULT NOTE - Follow Up Consult  Pharmacy Consult for heparin Indication: pulmonary embolus  Not on File  Patient Measurements: Height: 5\' 11"  (180.3 cm) Weight: 289 lb 14.4 oz (131.498 kg) IBW/kg (Calculated) : 75.3 Heparin Dosing Weight: 108 kg  Vital Signs: Temp: 98.1 F (36.7 C) (03/17 1844) Temp src: Oral (03/17 1844) BP: 136/78 mmHg (03/17 1844) Pulse Rate: 90 (03/17 1844)  Labs:  Recent Labs  04/07/13 2024 04/07/13 2325 04/08/13 0540 04/08/13 1340  HGB 13.8  --  13.8  --   HCT 39.4  --  39.9  --   PLT 139*  --  121*  --   APTT 105*  --   --   --   LABPROT 14.0  --   --   --   INR 1.10  --   --   --   HEPARINUNFRC 0.82*  --  0.90* 0.57  CREATININE 0.97  --  1.06  --   TROPONINI 0.38* 0.30* <0.30  --     Estimated Creatinine Clearance: 93.5 ml/min (by C-G formula based on Cr of 1.06).   Medications:  Scheduled:  . aspirin  81 mg Oral Daily  . folic acid  1 mg Oral Daily  . gabapentin  300 mg Oral TID  . multivitamin with minerals  1 tablet Oral Daily  . QUEtiapine  150 mg Oral Daily  . thiamine  100 mg Oral Daily  . Warfarin - Pharmacist Dosing Inpatient   Does not apply q1800   Infusions:  . heparin 1,600 Units/hr (04/08/13 8469)    Assessment: 68 yo male with PE is currently on therapeutic heparin.  Heparin level is 0.41. Goal of Therapy:  Heparin level 0.3-0.7 units/ml Monitor platelets by anticoagulation protocol: Yes   Plan:  1) Continue heparin at 1600 units/hr 2) Daily heparin level and CBC  Nyheim Seufert, Tsz-Yin 04/08/2013,7:33 PM

## 2013-04-08 NOTE — Progress Notes (Signed)
ANTICOAGULATION CONSULT NOTE - Follow Up Consult  Pharmacy Consult for heparin Indication: pulmonary embolus  Labs:  Recent Labs  04/07/13 2024 04/07/13 2325 04/08/13 0540  HGB 13.8  --   --   HCT 39.4  --   --   PLT 139*  --   --   APTT 105*  --   --   LABPROT 14.0  --   --   INR 1.10  --   --   HEPARINUNFRC 0.82*  --  0.90*  CREATININE 0.97  --   --   TROPONINI 0.38* 0.30*  --     Assessment: 67yo male now with increased heparin level despite decreased rate last pm.  Goal of Therapy:  Heparin level 0.3-0.7 units/ml   Plan:  Will decrease heparin gtt by 3 units/kg/hr to 1600 units/hr and check level in 6hr.  Wynona Neat, PharmD, BCPS  04/08/2013,6:14 AM

## 2013-04-08 NOTE — Progress Notes (Addendum)
PULMONARY / CRITICAL CARE MEDICINE   Name: Nicholas [name] MRN: [id number] DOB: 1945-09-30    ADMISSION DATE:  04/07/2013 CONSULTATION DATE:  04/07/2013  REFERRING MD :  [facility] PRIMARY SERVICE: PCCM  CHIEF COMPLAINT:  SOB  BRIEF PATIENT DESCRIPTION: 67 y.o. M transferred to [facility] ICU from [facility] with PE. PCCM to admit for catheter directed tPA that was recommended by Dr. Jeronimo Norma.  Patient refused it.  SIGNIFICANT EVENTS / STUDIES:  3/16 >>> presented to [facility] with SOB, found to have PE, transferred to [facility] ICU.  CTA showed large acute PE on R side with saddle embolus and filling defect, smaller PE in RLL, evidence of right heart strain.  LINES / TUBES: PIV  CULTURES: None  ANTIBIOTICS: None   SUBJECTIVE: No acute events overnight.  Pt feels good, denies SOB, chest pain, N/V.  VITAL SIGNS: Temp:  [97.2 F (36.2 C)-99.3 F (37.4 C)] 97.2 F (36.2 C) (03/17 1234) Pulse Rate:  [74-108] 79 (03/17 1300) Resp:  [13-20] 18 (03/17 1300) BP: (93-139)/(52-99) 139/83 mmHg (03/17 1300) SpO2:  [91 %-96 %] 96 % (03/17 1300) Weight:  [299 lb 13.2 oz (136 kg)] 299 lb 13.2 oz (136 kg) (03/17 0500)  HEMODYNAMICS:   VENTILATOR SETTINGS:   INTAKE / OUTPUT: Intake/Output     03/16 0701 - 03/17 0700 03/17 0701 - 03/18 0700   P.O. 400    I.V. (mL/kg) 171.2 (1.3) 96 (0.7)   Total Intake(mL/kg) 571.2 (4.2) 96 (0.7)   Urine (mL/kg/hr) 1200 800 (0.9)   Total Output 1200 800   Net -628.8 -704          PHYSICAL EXAMINATION: General: Pleasant WDWN male, resting in bed, in NAD. Neuro: A&O x 3, non-focal.  HEENT: [place]/AT. PERRL, sclerae anicteric. Cardiovascular: RRR, no M/R/G.  Lungs: Respirations even and unlabored.  CTA bilaterally, No W/R/R.  Abdomen: BS x 4, soft, NT/ND.  Musculoskeletal: No gross deformities, no edema.  Skin: Intact, warm, no rashes.  LABS:  CBC  Recent Labs Lab 04/07/13 2024 04/08/13 0540  WBC 7.5 5.7  HGB 13.8 13.8  HCT 39.4 39.9  PLT 139* 121*   Coag's  Recent  Labs Lab 04/07/13 2024  APTT 105*  INR 1.10   BMET  Recent Labs Lab 04/07/13 2024 04/08/13 0540  NA 139 141  K 4.0 4.8  CL 99 101  CO2 24 29  BUN 21 19  CREATININE 0.97 1.06  GLUCOSE 134* 125*   Electrolytes  Recent Labs Lab 04/07/13 2024 04/08/13 0540  CALCIUM 9.2 9.3  MG  --  2.3  PHOS  --  3.4   Liver Enzymes  Recent Labs Lab 04/07/13 2024  AST 22  23  ALT 15  16  ALKPHOS 38*  38*  BILITOT 0.7  0.7  ALBUMIN 3.7  3.8   Cardiac Enzymes  Recent Labs Lab 04/07/13 2024 04/07/13 2325 04/08/13 0540  TROPONINI 0.38* 0.30* <0.30  PROBNP 1217.0*  --   --    Glucose  Recent Labs Lab 04/07/13 1900  GLUCAP 162*    Imaging Dg Chest Port 1 View  04/08/2013   CLINICAL DATA:  Pulmonary emboli, shortness of breath, followup  EXAM: PORTABLE CHEST - 1 VIEW  COMPARISON:  Portable chest x-ray of 04/07/2013  FINDINGS: There is slightly more haziness at the right lung base consistent with atelectasis with probable chronic elevation of the right hemidiaphragm. The left lung is clear. Heart size is stable.  IMPRESSION: Persistent elevation of the left hemidiaphragm with mild  right basilar volume loss.   Electronically Signed   By: Ivar Drape M.D.   On: 04/08/2013 08:19   Dg Chest Port 1 View  04/07/2013   CLINICAL DATA:  Bilateral pulmonary emboli  EXAM: PORTABLE CHEST - 1 VIEW  COMPARISON:  Portable exam 1807 hr without priors for comparison  FINDINGS: Elevation of right diaphragm.  Upper normal heart size.  Mediastinal contours and pulmonary vascularity normal.  Minimal peribronchial thickening.  No infiltrate, pleural effusion or pneumothorax.  Bones unremarkable.  IMPRESSION: Minimal bronchitic changes without infiltrate.   Electronically Signed   By: Lavonia Dana M.D.   On: 04/07/2013 18:27     ASSESSMENT / PLAN:  PULMONARY A: Acute PE.  Treatment options discussed at length with patient, he does not want catheter directed tPA, wishes only for  anticoagulation since he feels so much better already and wishes for coumadin over xeralto. P:   - Full anticoagulation as below, may start coumadin with heparin - Supplemental O2 to maintain SpO2 > 94%. - LE dopplers pending, if mobile clot then filter, otherwise NOT needed -see work up cause  CARDIOVASCULAR A: Troponin Bump (0.15 at [facility]) - downtrending now, from PE, rv strain likely, r/o prior pulm HTN as presents well, low O2 needs P:  - Echo done this PM, f/u on official read, await Pa pressures - ASA -heparin, add coumaind -see heme  RENAL A: At risk for contrast induced nephropathy - SCr today only with minimal rise, therefore unlikely. P:   - Monitor BMET. - Replace electrolytes as indicated -kvo  GASTROINTESTINAL A:  Diet LFT's within normal limits - initial concern for alcoholic hepatitis. P:   - SUP not indicated. - Heart healthy diet.  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 = liver US   INFECTIOUS A:  No acute issues. P:   - Monitor WBC's/fever curve.  ENDOCRINE A:   No acute issues.  No history of diabetes. P:   - Monitor.  NEUROLOGIC A:   No acute issues.  Denies alcohol abuse emphatically. P:   - Monitor for signs of ETOH withdrawal. - thiamine, folate and MVI   GLOBAL:  To tele, coum add to heparin, await doppler legs, echo for rv   Montey Hora, PA - [facility] Pgr: [phone]  or [phone]  I have fully examined this patient and agree with above findings.    And edited in full  Lavon Paganini. Titus Mould, MD, [place] Pgr: [place] [facility]

## 2013-04-08 NOTE — Progress Notes (Signed)
ANTICOAGULATION CONSULT NOTE - Follow Up Consult  Pharmacy Consult for heparin Indication: pulmonary embolus  Patient Measurements:  Heparin Dosing Weight: 108kg Labs:  Recent Labs  04/07/13 2024 04/07/13 2325 04/08/13 0540 04/08/13 1340  HGB 13.8  --  13.8  --   HCT 39.4  --  39.9  --   PLT 139*  --  121*  --   APTT 105*  --   --   --   LABPROT 14.0  --   --   --   INR 1.10  --   --   --   HEPARINUNFRC 0.82*  --  0.90* 0.57  CREATININE 0.97  --  1.06  --   TROPONINI 0.38* 0.30* <0.30  --     Assessment: 68yo male on IV heparin level and to start warfarin for massive bilateral pulmonary embolus. INR baseline is 1.1. Heparin is therapeutic on 1600 units/hr. H/H is stable, no bleeding noted.   Goal of Therapy:  Heparin level 0.3-0.7 units/ml   Plan:  Continue heparin at 1600 units/hr. Heparin level in 6 hrs to confirm then daily with CBC while on therapy.  Warfarin 7.5mg  po x1 tonight. Daily PT/INR.   Sloan Leiter, PharmD, BCPS Clinical Pharmacist [phone]  04/08/2013,3:28 PM

## 2013-04-08 NOTE — Progress Notes (Signed)
VASCULAR LAB PRELIMINARY  PRELIMINARY  PRELIMINARY  PRELIMINARY  Bilateral lower extremity venous duplex  completed.    Preliminary report:  Right:  DVT noted in the popliteal vein and calf veins.  No evidence of superficial thrombosis.  No Baker's cyst.  Left:  No evidence of DVT, superficial thrombosis, or Baker's cyst.  Bessie Boyte, RVT 04/08/2013, 3:00 PM

## 2013-04-09 ENCOUNTER — Encounter (HOSPITAL_COMMUNITY): Payer: Self-pay | Admitting: General Practice

## 2013-04-09 ENCOUNTER — Inpatient Hospital Stay (HOSPITAL_COMMUNITY): Payer: BC Managed Care – PPO

## 2013-04-09 DIAGNOSIS — I82409 Acute embolism and thrombosis of unspecified deep veins of unspecified lower extremity: Secondary | ICD-10-CM

## 2013-04-09 DIAGNOSIS — I2699 Other pulmonary embolism without acute cor pulmonale: Secondary | ICD-10-CM | POA: Diagnosis not present

## 2013-04-09 LAB — PROTEIN C, TOTAL: Protein C, Total: 127 % (ref 72–160)

## 2013-04-09 LAB — COMPREHENSIVE METABOLIC PANEL
ALBUMIN: 3.9 g/dL (ref 3.5–5.2)
ALT: 15 U/L (ref 0–53)
AST: 25 U/L (ref 0–37)
Alkaline Phosphatase: 43 U/L (ref 39–117)
BUN: 16 mg/dL (ref 6–23)
CALCIUM: 9.5 mg/dL (ref 8.4–10.5)
CO2: 25 meq/L (ref 19–32)
Chloride: 102 mEq/L (ref 96–112)
Creatinine, Ser: 1.04 mg/dL (ref 0.50–1.35)
GFR calc Af Amer: 84 mL/min — ABNORMAL LOW (ref >90)
GFR calc non Af Amer: 72 mL/min — ABNORMAL LOW (ref >90)
Glucose, Bld: 126 mg/dL — ABNORMAL HIGH (ref 70–99)
Potassium: 5.7 mEq/L — ABNORMAL HIGH (ref 3.7–5.3)
SODIUM: 142 meq/L (ref 137–147)
Total Bilirubin: 0.6 mg/dL (ref 0.3–1.2)
Total Protein: 6.9 g/dL (ref 6.0–8.3)

## 2013-04-09 LAB — HEPARIN LEVEL (UNFRACTIONATED)
HEPARIN UNFRACTIONATED: 0.44 [IU]/mL (ref 0.30–0.70)
Heparin Unfractionated: 0.26 IU/mL — ABNORMAL LOW (ref 0.30–0.70)

## 2013-04-09 LAB — PROTEIN S, TOTAL: PROTEIN S AG TOTAL: 149 % (ref 60–150)

## 2013-04-09 LAB — PROTIME-INR
INR: 0.96 (ref 0.00–1.49)
Prothrombin Time: 12.6 seconds (ref 11.6–15.2)

## 2013-04-09 LAB — CBC
HCT: 42.8 % (ref 39.0–52.0)
Hemoglobin: 14.7 g/dL (ref 13.0–17.0)
MCH: 32.2 pg (ref 26.0–34.0)
MCHC: 34.3 g/dL (ref 30.0–36.0)
MCV: 93.7 fL (ref 78.0–100.0)
PLATELETS: 138 10*3/uL — AB (ref 150–400)
RBC: 4.57 MIL/uL (ref 4.22–5.81)
RDW: 13.5 % (ref 11.5–15.5)
WBC: 7.7 10*3/uL (ref 4.0–10.5)

## 2013-04-09 LAB — GLUCOSE, CAPILLARY: Glucose-Capillary: 128 mg/dL — ABNORMAL HIGH (ref 70–99)

## 2013-04-09 LAB — FACTOR 5 LEIDEN

## 2013-04-09 LAB — ANA: ANA: NEGATIVE

## 2013-04-09 LAB — PROTHROMBIN GENE MUTATION

## 2013-04-09 MED ORDER — WARFARIN VIDEO
Freq: Once | Status: AC
  Administered 2013-04-09: 12:00:00

## 2013-04-09 MED ORDER — COUMADIN BOOK
Freq: Once | Status: AC
  Administered 2013-04-09: 13:00:00
  Filled 2013-04-09: qty 1

## 2013-04-09 MED ORDER — PNEUMOCOCCAL VAC POLYVALENT 25 MCG/0.5ML IJ INJ
0.5000 mL | INJECTION | INTRAMUSCULAR | Status: AC
  Administered 2013-04-10: 0.5 mL via INTRAMUSCULAR
  Filled 2013-04-09: qty 0.5

## 2013-04-09 MED ORDER — WARFARIN SODIUM 10 MG PO TABS
10.0000 mg | ORAL_TABLET | Freq: Once | ORAL | Status: AC
Start: 2013-04-09 — End: 2013-04-09
  Administered 2013-04-09: 10 mg via ORAL
  Filled 2013-04-09: qty 1

## 2013-04-09 NOTE — Progress Notes (Signed)
ANTICOAGULATION CONSULT NOTE - Follow Up Consult  Pharmacy Consult for Heparin, Coumadin Indication: pulmonary embolus  Not on File  Patient Measurements: Height: 5\' 11"  (180.3 cm) Weight: 298 lb 8 oz (135.399 kg) IBW/kg (Calculated) : 75.3 Heparin Dosing Weight: 106 kg  Vital Signs: Temp: 98.7 F (37.1 C) (03/18 0527) Temp src: Oral (03/18 0527) BP: 121/68 mmHg (03/18 0527) Pulse Rate: 85 (03/18 0527)  Labs:  Recent Labs  04/07/13 2024 04/07/13 2325 04/08/13 0540 04/08/13 1340 04/08/13 1948 04/09/13 0340  HGB 13.8  --  13.8  --   --  14.7  HCT 39.4  --  39.9  --   --  42.8  PLT 139*  --  121*  --   --  138*  APTT 105*  --   --   --   --   --   LABPROT 14.0  --   --   --   --  12.6  INR 1.10  --   --   --   --  0.96  HEPARINUNFRC 0.82*  --  0.90* 0.57 0.41 0.26*  CREATININE 0.97  --  1.06  --   --  1.04  TROPONINI 0.38* 0.30* <0.30  --   --   --     Estimated Creatinine Clearance: 96.8 ml/min (by C-G formula based on Cr of 1.04).   Medications:  Infusions:  . heparin 1,600 Units/hr (04/09/13 0413)    Assessment: 68 year old male admitted with PE.  He is on Day #2 of a minimum 5 day overlap of Heparin to Coumadin.  His INR is essentially unchanged as would be expected after only 1 dose of Coumadin.  His heparin level was slightly subtherapeutic this morning and his rate increase that was ordered at 0430 was not actually increased until 10am today.  Goal of Therapy:  INR 2-3 Heparin level 0.3-0.7 units/ml Monitor platelets by anticoagulation protocol: Yes   Plan:  Continue Heparin at 1800 units/hr Will retime heparin level to be drawn 8 hours after his rate was increased Coumadin 10mg  today Daily PT/INR Will order Coumadin education materials today.  Legrand Como, Pharm.D., BCPS, AAHIVP Clinical Pharmacist Phone: [phone] or [phone] 04/09/2013, 10:15 AM

## 2013-04-09 NOTE — Progress Notes (Signed)
ANTICOAGULATION CONSULT NOTE - Follow Up Consult  Pharmacy Consult for Heparin, Coumadin Indication: pulmonary embolus  Not on File  Patient Measurements: Height: 5\' 11"  (180.3 cm) Weight: 298 lb 8 oz (135.399 kg) IBW/kg (Calculated) : 75.3 Heparin Dosing Weight: 106 kg  Vital Signs: Temp: 97 F (36.1 C) (03/18 1350) Temp src: Oral (03/18 1350) BP: 142/113 mmHg (03/18 1350) Pulse Rate: 82 (03/18 1350)  Labs:  Recent Labs  04/07/13 2024 04/07/13 2325 04/08/13 0540  04/08/13 1948 04/09/13 0340 04/09/13 1850  HGB 13.8  --  13.8  --   --  14.7  --   HCT 39.4  --  39.9  --   --  42.8  --   PLT 139*  --  121*  --   --  138*  --   APTT 105*  --   --   --   --   --   --   LABPROT 14.0  --   --   --   --  12.6  --   INR 1.10  --   --   --   --  0.96  --   HEPARINUNFRC 0.82*  --  0.90*  < > 0.41 0.26* 0.44  CREATININE 0.97  --  1.06  --   --  1.04  --   TROPONINI 0.38* 0.30* <0.30  --   --   --   --   < > = values in this interval not displayed.  Estimated Creatinine Clearance: 96.8 ml/min (by C-G formula based on Cr of 1.04).  Medications:  Infusions:  . heparin 1,600 Units/hr (04/09/13 0413)   Assessment: 68 year old male admitted with PE.  He is on Day #2 of a minimum 5 day overlap of Heparin to Coumadin.  His INR is essentially unchanged as would be expected after only 1 dose of Coumadin.  His heparin level was slightly subtherapeutic this morning and his rate increase that was ordered at 0430 was not actually increased until 10am today.  His repeat level this evening is within target range on IV heparin rate of 1800 units/hr.  He has no noted bleeding complications.  Goal of Therapy:  INR 2-3 Heparin level 0.3-0.7 units/ml Monitor platelets by anticoagulation protocol: Yes   Plan:  Will continue Heparin at 1800 units/hr Will f/u AM heparin level and CBC  Rober Minion, PharmD., MS Clinical Pharmacist Pager:  [phone] Thank you for allowing pharmacy to be  part of this patients care team. 04/09/2013, 7:31 PM

## 2013-04-09 NOTE — Progress Notes (Signed)
ANTICOAGULATION CONSULT NOTE - Follow Up Consult  Pharmacy Consult for Heparin  Indication: pulmonary embolus  Not on File  Patient Measurements: Height: 5\' 11"  (180.3 cm) Weight: 289 lb 14.4 oz (131.498 kg) IBW/kg (Calculated) : 75.3 Heparin Dosing Weight: 108 kg  Vital Signs: Temp: 98.1 F (36.7 C) (03/17 1844) Temp src: Oral (03/17 1844) BP: 136/78 mmHg (03/17 1844) Pulse Rate: 90 (03/17 1844)  Labs:  Recent Labs  04/07/13 2024 04/07/13 2325 04/08/13 0540 04/08/13 1340 04/08/13 1948 04/09/13 0340  HGB 13.8  --  13.8  --   --   --   HCT 39.4  --  39.9  --   --   --   PLT 139*  --  121*  --   --   --   APTT 105*  --   --   --   --   --   LABPROT 14.0  --   --   --   --  12.6  INR 1.10  --   --   --   --  0.96  HEPARINUNFRC 0.82*  --  0.90* 0.57 0.41 0.26*  CREATININE 0.97  --  1.06  --   --   --   TROPONINI 0.38* 0.30* <0.30  --   --   --     Estimated Creatinine Clearance: 93.5 ml/min (by C-G formula based on Cr of 1.06).   Medications:  Heparin 1600 units/hr  Assessment: 68 y/o M on heparin for PE. HL is sub-therapeutic at 0.26. Other labs as above.   Goal of Therapy:  Heparin level 0.3-0.7 units/ml Monitor platelets by anticoagulation protocol: Yes   Plan:  -Increase heparin drip to 1800 units/hr -1300 HL -Daily CBC/HL -Monitor for bleeding  Narda Bonds 04/09/2013,4:39 AM

## 2013-04-09 NOTE — Progress Notes (Signed)
PULMONARY / CRITICAL CARE MEDICINE   Name: Nicholas [name] MRN: [id number] DOB: Apr 10, 1945    ADMISSION DATE:  04/07/2013 CONSULTATION DATE:  04/07/2013  REFERRING MD :  [facility] PRIMARY SERVICE: PCCM  CHIEF COMPLAINT:  SOB  BRIEF PATIENT DESCRIPTION: 68 y.o. M transferred to [facility] ICU from [facility] with PE. PCCM to admit for catheter directed tPA that was recommended by Dr. Jeronimo Norma.  Patient refused it.  SIGNIFICANT EVENTS / STUDIES:  3/16 >>> presented to [facility] with SOB, found to have PE, transferred to [facility] ICU.  CTA showed large acute PE on R side with saddle embolus and filling defect, smaller PE in RLL, evidence of right heart strain. 3/17 >>> LE Dopplers + DVT in right leg and neg in left.  Transferred out of ICU to tele. 3/18 abd ultrasound >>>  LINES / TUBES: PIV  CULTURES: None  ANTIBIOTICS: None  SUBJECTIVE: No acute events overnight.  Pt denies SOB, chest pain, N/V.  Became a little SOB while getting abd ultrasound when he was asked to hold his breath, otherwise no problems or concerns.  VITAL SIGNS: Temp:  [97.2 F (36.2 C)-98.7 F (37.1 C)] 98.7 F (37.1 C) (03/18 0527) Pulse Rate:  [74-90] 85 (03/18 0527) Resp:  [15-21] 20 (03/17 1844) BP: (115-139)/(63-83) 121/68 mmHg (03/18 0527) SpO2:  [93 %-96 %] 93 % (03/18 0527) Weight:  [289 lb 14.4 oz (131.498 kg)-298 lb 8 oz (135.399 kg)] 298 lb 8 oz (135.399 kg) (03/18 0648)  HEMODYNAMICS:   VENTILATOR SETTINGS:   INTAKE / OUTPUT: Intake/Output     03/17 0701 - 03/18 0700 03/18 0701 - 03/19 0700   P.O.     I.V. (mL/kg) 384 (2.8)    Total Intake(mL/kg) 384 (2.8)    Urine (mL/kg/hr) 1150 (0.4)    Total Output 1150     Net -766          Urine Occurrence 1 x    Stool Occurrence 1 x      PHYSICAL EXAMINATION: General: Pleasant WDWN male, resting in bed, in NAD. Neuro: A&O x 3, non-focal.  HEENT: [place]/AT. PERRL, sclerae anicteric. Cardiovascular: RRR, no M/R/G.  Lungs: Respirations even and unlabored.  CTA bilaterally, No  W/R/R.  Abdomen: BS x 4, soft, NT/ND.  Musculoskeletal: No gross deformities, no edema.  Skin: Intact, warm, no rashes.  LABS: I have reviewed all of today's lab results. Relevant abnormalities are discussed in the A/P section   CXR: no new CXR    ASSESSMENT / PLAN: Acute PE, RLE DVT - ? Unprovoked but hx of multiple prior LE ortho procedures Full anticoagulation as below, coumadin started 3/17. Supplemental O2 to maintain SpO2 > 93%.  Troponin Bump (0.15 at [facility]) - likely due to PE  Cont ASA, statin  Hyperkalemia  Monitor BMET, if K rises further, will treat. Replace electrolytes as indicated    Montey Hora, PA - [facility] Pgr: [phone]  or [phone]  PCCM ATTENDING: I have interviewed and examined the patient and reviewed the database. I have formulated the assessment and plan as reflected in the note above with amendments made by me.   No distress. Plan discharge once INR in therapeutic range X 2 days   Merton Border, MD;  PCCM service; Mobile [phone]

## 2013-04-10 DIAGNOSIS — I2699 Other pulmonary embolism without acute cor pulmonale: Secondary | ICD-10-CM | POA: Diagnosis not present

## 2013-04-10 DIAGNOSIS — I82409 Acute embolism and thrombosis of unspecified deep veins of unspecified lower extremity: Secondary | ICD-10-CM | POA: Diagnosis not present

## 2013-04-10 LAB — HEPARIN LEVEL (UNFRACTIONATED): Heparin Unfractionated: 0.35 IU/mL (ref 0.30–0.70)

## 2013-04-10 LAB — CBC
HEMATOCRIT: 45 % (ref 39.0–52.0)
HEMOGLOBIN: 15.8 g/dL (ref 13.0–17.0)
MCH: 32.9 pg (ref 26.0–34.0)
MCHC: 35.1 g/dL (ref 30.0–36.0)
MCV: 93.8 fL (ref 78.0–100.0)
Platelets: 143 10*3/uL — ABNORMAL LOW (ref 150–400)
RBC: 4.8 MIL/uL (ref 4.22–5.81)
RDW: 13.6 % (ref 11.5–15.5)
WBC: 6.6 10*3/uL (ref 4.0–10.5)

## 2013-04-10 LAB — BASIC METABOLIC PANEL
BUN: 14 mg/dL (ref 6–23)
CALCIUM: 9.9 mg/dL (ref 8.4–10.5)
CO2: 28 meq/L (ref 19–32)
CREATININE: 1.02 mg/dL (ref 0.50–1.35)
Chloride: 101 mEq/L (ref 96–112)
GFR calc Af Amer: 86 mL/min — ABNORMAL LOW (ref >90)
GFR calc non Af Amer: 74 mL/min — ABNORMAL LOW (ref >90)
Glucose, Bld: 141 mg/dL — ABNORMAL HIGH (ref 70–99)
Potassium: 4.8 mEq/L (ref 3.7–5.3)
Sodium: 141 mEq/L (ref 137–147)

## 2013-04-10 LAB — GLUCOSE, CAPILLARY: Glucose-Capillary: 129 mg/dL — ABNORMAL HIGH (ref 70–99)

## 2013-04-10 LAB — PROTIME-INR
INR: 1.07 (ref 0.00–1.49)
PROTHROMBIN TIME: 13.7 s (ref 11.6–15.2)

## 2013-04-10 MED ORDER — QUETIAPINE FUMARATE ER 50 MG PO TB24
150.0000 mg | ORAL_TABLET | Freq: Every day | ORAL | Status: DC
  Administered 2013-04-10: 150 mg via ORAL
  Filled 2013-04-10 (×2): qty 3

## 2013-04-10 MED ORDER — ENOXAPARIN SODIUM 150 MG/ML ~~LOC~~ SOLN
130.0000 mg | Freq: Two times a day (BID) | SUBCUTANEOUS | Status: DC
  Administered 2013-04-10 – 2013-04-11 (×2): 130 mg via SUBCUTANEOUS
  Filled 2013-04-10 (×4): qty 1

## 2013-04-10 MED ORDER — WARFARIN SODIUM 2.5 MG PO TABS
12.5000 mg | ORAL_TABLET | Freq: Once | ORAL | Status: AC
  Administered 2013-04-10: 12.5 mg via ORAL
  Filled 2013-04-10: qty 1

## 2013-04-10 MED FILL — Heparin Sodium (Porcine) 100 Unt/ML in Sodium Chloride 0.45%: INTRAMUSCULAR | Qty: 250 | Status: AC

## 2013-04-10 NOTE — Progress Notes (Signed)
ANTICOAGULATION CONSULT NOTE - Follow Up Consult  Pharmacy Consult for Heparin, Coumadin Indication: pulmonary embolus  Not on File  Patient Measurements: Height: 5\' 11"  (180.3 cm) Weight: 297 lb 2.9 oz (134.8 kg) IBW/kg (Calculated) : 75.3 Heparin Dosing Weight: 106 kg  Vital Signs: Temp: 98.3 F (36.8 C) (03/19 0612) Temp src: Oral (03/19 0612) BP: 113/64 mmHg (03/19 0612) Pulse Rate: 72 (03/19 0612)  Labs:  Recent Labs  04/07/13 2024 04/07/13 2325 04/08/13 0540  04/08/13 1948 04/09/13 0340 04/09/13 1850 04/10/13 0828  HGB 13.8  --  13.8  --   --  14.7  --  15.8  HCT 39.4  --  39.9  --   --  42.8  --  45.0  PLT 139*  --  121*  --   --  138*  --  143*  APTT 105*  --   --   --   --   --   --   --   LABPROT 14.0  --   --   --   --  12.6  --  13.7  INR 1.10  --   --   --   --  0.96  --  1.07  HEPARINUNFRC 0.82*  --  0.90*  < > 0.41 0.26* 0.44  --   CREATININE 0.97  --  1.06  --   --  1.04  --  1.02  TROPONINI 0.38* 0.30* <0.30  --   --   --   --   --   < > = values in this interval not displayed.  Estimated Creatinine Clearance: 98.5 ml/min (by C-G formula based on Cr of 1.02).  Medications:  Infusions:  . heparin 1,800 Units/hr (04/09/13 2051)   Assessment: CC: SOB on golf [place]  PMH: OA  AC: Heparin for saddle PE (pt refused TPA), R leg DVT - Hypercoagulable workup neg. MD refusing Lovenox in setting of massive PE. Start warfarin overlap. Needs onc work-up for r/o cancer.  He is on Day #3 of a minimum 5 day overlap of Heparin to Coumadin. Heparin level 0.44. INR1.07  .  Goal of Therapy:  INR 2-3 Heparin level 0.3-0.7 units/ml Monitor platelets by anticoagulation protocol: Yes   Plan:  Coumadin 12.5mg  po x 1 tonight Continue IV heparin at 1800 units/hr   Mani Celestin S. Alford Highland, PharmD, [facility] Clinical Staff Pharmacist Pager [phone]  04/10/2013, 9:31 AM

## 2013-04-10 NOTE — Progress Notes (Signed)
ANTICOAGULATION CONSULT NOTE - Follow Up Consult  Pharmacy Consult for Heparin, Coumadin > transition to Enoxaparin Indication: pulmonary embolus  Not on File  Patient Measurements: Height: 5\' 11"  (180.3 cm) Weight: 297 lb 2.9 oz (134.8 kg) IBW/kg (Calculated) : 75.3 Heparin Dosing Weight: 106 kg  Vital Signs: Temp: 98.3 F (36.8 C) (03/19 0612) Temp src: Oral (03/19 0612) BP: 113/64 mmHg (03/19 0612) Pulse Rate: 72 (03/19 0612)  Labs:  Recent Labs  04/07/13 2024 04/07/13 2325 04/08/13 0540  04/09/13 0340 04/09/13 1850 04/10/13 0828  HGB 13.8  --  13.8  --  14.7  --  15.8  HCT 39.4  --  39.9  --  42.8  --  45.0  PLT 139*  --  121*  --  138*  --  143*  APTT 105*  --   --   --   --   --   --   LABPROT 14.0  --   --   --  12.6  --  13.7  INR 1.10  --   --   --  0.96  --  1.07  HEPARINUNFRC 0.82*  --  0.90*  < > 0.26* 0.44 0.35  CREATININE 0.97  --  1.06  --  1.04  --  1.02  TROPONINI 0.38* 0.30* <0.30  --   --   --   --   < > = values in this interval not displayed.  Estimated Creatinine Clearance: 98.5 ml/min (by C-G formula based on Cr of 1.02).  Medications:  Infusions:  . heparin 1,800 Units/hr (04/10/13 1030)   Assessment: 68 year old male admitted with PE.  He is on Day #3 of a minimum 5 day overlap of Heparin to Coumadin. Pharmacy consulted to transition heparin to enoxaparin.  He has no noted bleeding complications. Heparin level noted to be in the low end of the therapeutic range.  Goal of Therapy:  INR 2-3 Heparin level 0.3-0.7 units/ml > 4h HL 0.6-1.2 units/ml Monitor platelets by anticoagulation protocol: Yes   Plan:  1. Enoxaparin 130 mg SQ q12h start at 1830 2. DC heparin at time of first enoxaparin dose, given HL on low end of range. 3. DC Heparin levels, continue to follow CBC.   Thank you for allowing pharmacy to be a part of this patients care team.  Rowe Robert Pharm.D., BCPS Clinical Pharmacist 04/10/2013 5:38 PM Pager: [phone] Phone: [phone]      04/10/2013, 5:25 PM

## 2013-04-10 NOTE — Progress Notes (Addendum)
PULMONARY / CRITICAL CARE MEDICINE   Name: Rex Oesterle MRN: [id number] DOB: 15-Dec-1945    ADMISSION DATE:  04/07/2013 CONSULTATION DATE:  04/07/2013  REFERRING MD :  [facility] PRIMARY SERVICE: PCCM  CHIEF COMPLAINT:  SOB  BRIEF PATIENT DESCRIPTION: 68 y.o. M transferred to [facility] ICU from [facility] with PE. PCCM to admit for catheter directed tPA that was recommended by Dr. Jeronimo Norma.  Patient refused it.  SIGNIFICANT EVENTS / STUDIES:  3/16 >>> presented to [facility] with SOB, found to have PE, transferred to [facility] ICU.  CTA showed large acute PE on R side with saddle embolus and filling defect, smaller PE in RLL, evidence of right heart strain. 3/17 >>> LE Dopplers + DVT in right leg and neg in left.  Transferred out of ICU to tele. 3/18 abd ultrasound >>>  Steatosis, non-specific hypoechoic lesion w/ irreg margins in anterior right hepatic lobe (Ddx includes both fatty sparing and hepatic neoplasm).  LINES / TUBES: PIV  CULTURES: None  ANTIBIOTICS: None  SUBJECTIVE: No acute events overnight.  Pt denies SOB, chest pain, N/V.  Has not been out of bed yet, has asked to be able to get up.  VITAL SIGNS: Temp:  [97 F (36.1 C)-98.7 F (37.1 C)] 98.3 F (36.8 C) (03/19 0612) Pulse Rate:  [72-85] 72 (03/19 0612) Resp:  [20] 20 (03/19 0612) BP: (113-142)/(64-113) 113/64 mmHg (03/19 0612) SpO2:  [92 %-93 %] 92 % (03/19 0612) Weight:  [297 lb 2.9 oz (134.8 kg)] 297 lb 2.9 oz (134.8 kg) (03/19 0612)  HEMODYNAMICS:   VENTILATOR SETTINGS:   INTAKE / OUTPUT: Intake/Output     03/18 0701 - 03/19 0700 03/19 0701 - 03/20 0700   P.O. 360    I.V. (mL/kg)     Total Intake(mL/kg) 360 (2.7)    Urine (mL/kg/hr)     Total Output       Net +360            PHYSICAL EXAMINATION: General: Pleasant WDWN male, resting in bed, in NAD. Neuro: A&O x 3, non-focal.  HEENT: [place]/AT. PERRL, sclerae anicteric. Cardiovascular: RRR, no M/R/G.  Lungs: Respirations even and unlabored.  CTA bilaterally, No W/R/R.  Abdomen: BS  x 4, soft, NT/ND.  Musculoskeletal: No gross deformities, no edema.  Skin: Intact, warm, no rashes.  LABS: I have reviewed all of today's lab results. Relevant abnormalities are discussed in the A/P section   CXR: no new CXR    ASSESSMENT / PLAN: Acute PE, RLE DVT - ? Unprovoked but hx of multiple prior LE ortho procedures Full anticoagulation with heparin gtt, coumadin started 3/17.  Continue bridge until INR therapeutic x 2 --> labs for today 3/19 still pending, will follow up. Supplemental O2 to maintain SpO2 > 93%. D/c bed rest. Ambulate with PT.  Troponin Bump (0.15 at [facility]) - likely due to PE  Cont ASA, statin  Hyperkalemia  Monitor BMET, if K rises further, will treat.  Labs for today 3/19 still pending, will follow up. Replace electrolytes as indicated   Montey Hora, PA - C [facility] Pgr: [phone]  or [phone]  PCCM ATTENDING: I have interviewed and examined the patient and reviewed the database. I have formulated the assessment and plan as reflected in the note above with amendments made by me.  Can consider home 3/20 with LMWH bridge to warfarin. I have asked for assistance from pharm and care management to look into this  Merton Border,  MD;  PCCM service; Mobile [phone]

## 2013-04-11 DIAGNOSIS — R0902 Hypoxemia: Secondary | ICD-10-CM | POA: Diagnosis not present

## 2013-04-11 DIAGNOSIS — I498 Other specified cardiac arrhythmias: Secondary | ICD-10-CM | POA: Diagnosis not present

## 2013-04-11 DIAGNOSIS — I82409 Acute embolism and thrombosis of unspecified deep veins of unspecified lower extremity: Secondary | ICD-10-CM | POA: Diagnosis not present

## 2013-04-11 DIAGNOSIS — I2699 Other pulmonary embolism without acute cor pulmonale: Secondary | ICD-10-CM | POA: Diagnosis not present

## 2013-04-11 LAB — CBC
HCT: 41.7 % (ref 39.0–52.0)
Hemoglobin: 14.5 g/dL (ref 13.0–17.0)
MCH: 32.6 pg (ref 26.0–34.0)
MCHC: 34.8 g/dL (ref 30.0–36.0)
MCV: 93.7 fL (ref 78.0–100.0)
Platelets: 126 10*3/uL — ABNORMAL LOW (ref 150–400)
RBC: 4.45 MIL/uL (ref 4.22–5.81)
RDW: 13.7 % (ref 11.5–15.5)
WBC: 7.8 10*3/uL (ref 4.0–10.5)

## 2013-04-11 LAB — PROTIME-INR
INR: 1.41 (ref 0.00–1.49)
Prothrombin Time: 16.9 seconds — ABNORMAL HIGH (ref 11.6–15.2)

## 2013-04-11 MED ORDER — ENOXAPARIN SODIUM 150 MG/ML ~~LOC~~ SOLN: 130.0000 mg | Freq: Two times a day (BID) | SUBCUTANEOUS | Status: DC

## 2013-04-11 MED ORDER — WARFARIN SODIUM 10 MG PO TABS: 10.0000 mg | ORAL_TABLET | Freq: Every day | ORAL | Status: DC

## 2013-04-11 NOTE — Evaluation (Signed)
Physical Therapy Evaluation Patient Details Name: Nicholas [name] MRN: [id number] DOB: Jul 17, 1945 Today's Date: 04/11/2013 Time: 1345-1400 PT Time Calculation (min): 15 min  PT Assessment / Plan / Recommendation History of Present Illness  68 y.o. male admitted to [facility] on 04/07/13 with SOB and was found to be (+) for DVT and PE.  Blood thinner started.    Clinical Impression  Pt is progressing well with mobility.  O2 sats on RA throughout mobility 91-93%.  DOE 2/3 requiring a slower gait speed and more frequent rest breaks than his baseline mobility level.  Educated pt re: energy conservation at discharge and encouraged him to walk more with RN staff and his wife when she gets here later.      PT Assessment  Patent does not need any further PT services    Follow Up Recommendations  No PT follow up    Does the patient have the potential to tolerate intense rehabilitation     NA  Barriers to Discharge   None      Equipment Recommendations  None recommended by PT    Recommendations for Other Services   None  Frequency   NA- one time eval and d/c   Precautions / Restrictions Precautions Precautions: Other (comment) Precaution Comments: monitor O2 sats and DOE.     Pertinent Vitals/Pain O2 sats on 3 L O2 [place] 91% at rest, O2 sats on RA at rest 90-91%, O2 sats on RA during gait 91-93% and DOE 2/4.   HR stable in the 80s to 90s.        Mobility  Bed Mobility Overal bed mobility: Modified Independent General bed mobility comments: HOB elevated, used railing for support Transfers Overall transfer level: Modified independent Equipment used: None General transfer comment: used hands for transitions Ambulation/Gait Ambulation/Gait assistance: Supervision Ambulation Distance (Feet): 300 Feet Assistive device: None Gait Pattern/deviations: Antalgic Gait velocity: decreased Gait velocity interpretation: Below normal speed for age/gender General Gait Details: slow gait pattern with stiff  legged/antalgic pattern.  Pt reports his "bad knee" is stiff from being in bed, but that is normal for him.  He was taking his time trying not to get SOB.  DOE 2/4 with gait.  O2 sats 91-93% on RA.  1-2 standing rest breaks to check sats and recover breathing.         PT Goals(Current goals can be found in the care plan section) Acute Rehab PT Goals Patient Stated Goal: to go home later today if he can PT Goal Formulation: No goals set, d/c therapy  Visit Information  Last PT Received On: 04/11/13 Assistance Needed: +1 History of Present Illness: 68 y.o. male admitted to [facility] on 04/07/13 with SOB and was found to be (+) for DVT and PE.  Blood thinner started.         Prior [place] expects to be discharged to:: Private residence Living Arrangements: Spouse/significant other Available Help at Discharge: Family;Available PRN/intermittently (wife works days) Type of Home: House Home Access: Stairs to enter [employer] of Steps: 1 Entrance Stairs-Rails: Right Home Layout: One level Home Equipment: Environmental consultant - 2 wheels;Cane - single point;Bedside commode;Shower seat Prior Function Level of Independence: Independent Comments: still drives.  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   Communication Communication: No difficulties    Cognition  Cognition Arousal/Alertness: Awake/alert Behavior During Therapy: WFL for tasks assessed/performed Overall Cognitive Status: Within Functional Limits for tasks assessed    Extremity/Trunk Assessment Upper Extremity Assessment Upper  Extremity Assessment: Overall WFL for tasks assessed Lower Extremity Assessment Lower Extremity Assessment: Overall WFL for tasks assessed (h/o bil THA, and "bad knee") Cervical / Trunk Assessment Cervical / Trunk Assessment: Normal   Balance Balance Overall balance assessment: No apparent balance deficits (not formally assessed) (normal for him to have an abnormal  gait pattern) General Comments General comments (skin integrity, edema, etc.): Encouraged pt to walk with RN staff or wife later.    End of Session PT - End of Session Activity Tolerance: Patient limited by fatigue;Treatment limited secondary to medical complications (Comment) (limited by DOE during gait) Patient left: in chair;with call bell/phone within reach Nurse Communication: Mobility status;Other (comment) (left on RA after walk)    Wells Guiles B. Okechukwu Regnier, PT, DPT [phone]   04/11/2013, 2:15 PM

## 2013-04-11 NOTE — Plan of Care (Signed)
Problem: Consults Goal: Diagnosis - Venous Thromboembolism (VTE) Choose a selection  Outcome: Completed/Met Date Met:  04/11/13 DVT (Deep Vein Thrombosis) with pulmonary emboli

## 2013-04-11 NOTE — Care Management Note (Addendum)
    Page 1 of 1   04/11/2013     12:17:39 PM   CARE MANAGEMENT NOTE 04/11/2013  Patient:  Nicholas [name], Nicholas [name]   Account Number:  [id number]  Date Initiated:  04/11/2013  Documentation initiated by:  GRAVES-BIGELOW,Donnovan Stamour  Subjective/Objective Assessment:   Pt admitted for SOB + PE. Plan for home on lovenox. Pt to administer injections.  Pt uses [facility] in [place].     Action/Plan:   [facility] did [facility] to check on syringes and they only have brand. Benefits check in process. Will make pt aware once completed.   Anticipated DC Date:  04/11/2013   Anticipated DC Plan:  [place]  CM consult  Medication Assistance      Choice offered to / List presented to:             Status of service:  Completed, signed off Medicare Important Message given?   (If response is "NO", the following Medicare IM given date fields will be blank) Date Medicare IM given:   Date Additional Medicare IM given:    Discharge Disposition:  HOME/SELF CARE  Per UR Regulation:  Reviewed for med. necessity/level of care/duration of stay  If discussed at Long Length of Stay Meetings, dates discussed:    Comments:  Lovenox generic is covered, $20.00, no auth required, [facility] can [facility], [facility], [facility] for the name brand patient will pay the difference between the name brand and generic with the total beings somewhere around $730 or so depending on pharmacy.. Will make pt aware.  1216 Pt will get lovenox syringes from [facility] in [place] has 150 mg syringes. No further needs from CM at this time.

## 2013-04-11 NOTE — Progress Notes (Signed)
Discharge review done with patient.   Patient acknowledged understanding of information provided. Patient is stable and discharged home with family member. Nicholas [name]  

## 2013-04-11 NOTE — Discharge Summary (Signed)
Physician Discharge Summary       Patient ID: Nicholas [name] MRN: [id number] DOB/AGE: [birth year] 68 y.o.  Admit date: 04/07/2013 Discharge date: 04/11/2013  Discharge Diagnoses:  Active Problems:   PE (pulmonary embolism)   Hypoxemia   Sinus tachycardia   DVT of leg (deep venous thrombosis)  Detailed Hospital Course:   68 y.o. M transferred to [facility] ICU from [facility] with PE 3/16. PCCM to admit for catheter directed tPA that was recommended by Dr. Jeronimo Norma. Patient refused it. He also had a small bump in troponin. He was started on the usual treatment for PE including IV heparin and supplemental O2. 3/17 he was started on a coumadin bridge. He has been improving slowly, but was still demanding supplemental O2 to maintain adequate SpO2. 3/19 he was started on LMWH with hopes that he could finish his coumadin bridge at home while administering LMWH to himself. INR has been slow to respond to coumadin. Deemed a candidate for discharge 3/20.   Discharge Plan by diagnoses   Acute PE, RLE DVT - ? Unprovoked but hx of multiple prior LE ortho procedures Troponin Bump (0.15 at [facility]) - likely due to PE  (resolved) Hyperkalemia Resolved  Discharge Plan: -Pt to administer lovenox at home while bridging to coumadin. Instructions have been given.  -Coumadin 10mg  daily -f/u at coumadin clinic on Monday.  -f/u in pulmonary office   [facility] tests/ studies/ interventions and procedures   3/16 >>> presented to [facility] with SOB, found to have PE, transferred to [facility]  ICU. CTA showed large acute PE on R side with saddle embolus and filling defect, smaller PE in RLL, evidence of right heart strain.  3/17 >>> LE Dopplers + DVT in right leg and neg in left. Transferred out of ICU to tele.  3/18 abd ultrasound >>> Steatosis, non-specific hypoechoic lesion w/ irreg margins in anterior right hepatic lobe (Ddx includes both fatty sparing and hepatic neoplasm).  Consults  Discharge Exam:  BP 136/69  Pulse 96   Temp(Src) 98.8 F (37.1 C) (Oral)  Resp 20  Ht 5\' 11"  (1.803 m)  Wt 134.3 kg (296 lb 1.2 oz)  BMI 41.31 kg/m2  SpO2 93%  General: Pleasant WDWN male, resting in bed, in NAD.  Neuro: A&O x 3, non-focal.  HEENT: [place]/AT, PERRL, sclerae anicteric.  Cardiovascular: RRR, no M/R/G.  Lungs: Respirations even and unlabored. CTA bilaterally, No W/R/R.  Abdomen: BS x 4, soft, NT/ND.  Musculoskeletal: No gross deformities, no edema.  Skin: Intact, warm, no rashes.  Labs at discharge Lab Results  Component Value Date   CREATININE 1.02 04/10/2013   BUN 14 04/10/2013   NA 141 04/10/2013   K 4.8 04/10/2013   CL 101 04/10/2013   CO2 28 04/10/2013   Lab Results  Component Value Date   WBC 7.8 04/11/2013   HGB 14.5 04/11/2013   HCT 41.7 04/11/2013   MCV 93.7 04/11/2013   PLT 126* 04/11/2013   Lab Results  Component Value Date   ALT 15 04/09/2013   AST 25 04/09/2013   ALKPHOS 43 04/09/2013   BILITOT 0.6 04/09/2013   Lab Results  Component Value Date   INR 1.41 04/11/2013   INR 1.07 04/10/2013   INR 0.96 04/09/2013    Current radiology studies No results found.  Disposition:  Final discharge disposition not confirmed      Discharge Orders   Future Appointments Provider Department Dept Phone   04/14/2013 2:35 PM [facility] [place] Office  [phone]   04/23/2013 9:30 AM Melvenia Needles, NP [facility] [phone]   Future Orders Complete By Expires   Call MD for:  difficulty breathing, headache or visual disturbances  As directed    Call MD for:  extreme fatigue  As directed    Call MD for:  persistant dizziness or light-headedness  As directed    Call MD for:  severe uncontrolled pain  As directed    Call MD for:  temperature >100.4  As directed    Diet - low sodium heart healthy  As directed    Increase activity slowly  As directed        Medication List         aspirin 81 MG tablet  Take 81 mg by mouth daily.     AZOR 10-40 MG per  tablet  Generic drug:  amLODipine-olmesartan  Take 1 tablet by mouth daily.     enoxaparin 150 MG/ML injection  Commonly known as:  LOVENOX  Inject 0.87 mLs (130 mg total) into the skin every 12 (twelve) hours.     gabapentin 300 MG capsule  Commonly known as:  NEURONTIN  Take 1 capsule by mouth 3 (three) times daily.     HYDROcodone-acetaminophen 5-325 MG per tablet  Commonly known as:  NORCO/VICODIN  Take 1 tablet by mouth 2 (two) times daily as needed.     hyoscyamine 0.125 MG Tbdp disintergrating tablet  Commonly known as:  ANASPAZ  Take 1 tablet by mouth 4 (four) times daily as needed.     meloxicam 15 MG tablet  Commonly known as:  MOBIC  Take 1 tablet by mouth daily.     multivitamin tablet  Take 1 tablet by mouth daily.     omeprazole 40 MG capsule  Commonly known as:  PRILOSEC  Take 1 capsule by mouth daily.     SEROQUEL XR 150 MG 24 hr tablet  Generic drug:  QUEtiapine Fumarate  Take 1 tablet by mouth daily.     simvastatin 20 MG tablet  Commonly known as:  ZOCOR  Take 1 tablet by mouth daily.     Venlafaxine HCl 225 MG Tb24  Take 1 tablet by mouth daily.     VESICARE 10 MG tablet  Generic drug:  solifenacin  Take 1 tablet by mouth daily.     warfarin 10 MG tablet  Commonly known as:  COUMADIN  Take 1 tablet (10 mg total) by mouth daily.       Follow-up Information   Follow up with [facility] On 04/14/2013. (2:35PM - [facility]. )    Specialty:  Cardiology   Contact information:   [address] [place] [address] [phone]      Follow up with Rexene Edison, NP On 04/23/2013. (9:30 am - [facility])    Specialty:  Nurse Practitioner   Contact information:   [address] [address] [phone]      Discharged Condition: fair  Georgann Housekeeper, ACNP [facility] Pager [phone] or [phone]  Physician Statement:   The Patient was personally  examined, the discharge assessment and plan has been personally reviewed and I agree with ACNP Hoffman's assessment and plan. > 30 minutes of time have been dedicated to discharge assessment, planning and discharge instructions.   Signed:    PCCM ATTENDING: Discharge plan reviewed and discussed with ACNP Heber Girard and with pt  Merton Border, MD;  PCCM service; Mobile (  [phone]  

## 2013-04-11 NOTE — Progress Notes (Addendum)
ANTICOAGULATION CONSULT NOTE - Follow Up Consult  Pharmacy Consult for Heparin, Coumadin > transition to Enoxaparin Indication: pulmonary embolus  Not on File  Patient Measurements: Height: 5\' 11"  (180.3 cm) Weight: 296 lb 1.2 oz (134.3 kg) IBW/kg (Calculated) : 75.3 Heparin Dosing Weight: 106 kg  Vital Signs: Temp: 98.3 F (36.8 C) (03/20 0603) Temp src: Oral (03/20 0603) BP: 129/65 mmHg (03/20 0603) Pulse Rate: 77 (03/20 0603)  Labs:  Recent Labs  04/09/13 0340 04/09/13 1850 04/10/13 0828 04/11/13 0553  HGB 14.7  --  15.8 14.5  HCT 42.8  --  45.0 41.7  PLT 138*  --  143* 126*  LABPROT 12.6  --  13.7 16.9*  INR 0.96  --  1.07 1.41  HEPARINUNFRC 0.26* 0.44 0.35  --   CREATININE 1.04  --  1.02  --     Estimated Creatinine Clearance: 98.3 ml/min (by C-G formula based on Cr of 1.02).  Medications:  Infusions:    Assessment: 68 year old male admitted with PE.  He is on Day #4 of a minimum 5 day overlap of and now noted on lovenox (changed from heparin on 04/11/13). INR today is 1.41 and trend up. Patient for likely discharge today  Goal of Therapy:  INR= 2-3 Monitor platelets by anticoagulation protocol: Yes   Plan:  -Continue lovenox for at least 5 days and INR > 2 -Spoke with NP today and current plan is for coumadin 10mg  po daily upon discharge with lovenox and INR check on Monday. -Patient educated today -Will plan on 10mg  coumadin tonight he is here tonight  Hildred Laser, Pharm D 04/11/2013 10:31 AM

## 2013-04-14 ENCOUNTER — Ambulatory Visit (INDEPENDENT_AMBULATORY_CARE_PROVIDER_SITE_OTHER): Payer: BC Managed Care – PPO | Admitting: Pharmacist

## 2013-04-14 DIAGNOSIS — Z5181 Encounter for therapeutic drug level monitoring: Secondary | ICD-10-CM

## 2013-04-14 DIAGNOSIS — I2699 Other pulmonary embolism without acute cor pulmonale: Secondary | ICD-10-CM | POA: Diagnosis not present

## 2013-04-14 DIAGNOSIS — Z7189 Other specified counseling: Secondary | ICD-10-CM | POA: Insufficient documentation

## 2013-04-14 LAB — POCT INR: INR: 3.2

## 2013-04-14 MED ORDER — WARFARIN SODIUM 5 MG PO TABS: ORAL_TABLET | ORAL | Status: DC

## 2013-04-17 ENCOUNTER — Encounter (HOSPITAL_COMMUNITY): Payer: Self-pay | Admitting: Emergency Medicine

## 2013-04-17 ENCOUNTER — Ambulatory Visit (INDEPENDENT_AMBULATORY_CARE_PROVIDER_SITE_OTHER): Payer: BC Managed Care – PPO

## 2013-04-17 ENCOUNTER — Inpatient Hospital Stay (HOSPITAL_COMMUNITY)
Admission: EM | Admit: 2013-04-17 | Discharge: 2013-04-21 | DRG: 175 | Disposition: A | Discharge status: Home / Self Care | Payer: BC Managed Care – PPO | Attending: Pulmonary Disease | Admitting: Pulmonary Disease

## 2013-04-17 ENCOUNTER — Emergency Department (HOSPITAL_COMMUNITY): Payer: BC Managed Care – PPO

## 2013-04-17 DIAGNOSIS — Z86718 Personal history of other venous thrombosis and embolism: Secondary | ICD-10-CM

## 2013-04-17 DIAGNOSIS — Z7901 Long term (current) use of anticoagulants: Secondary | ICD-10-CM | POA: Diagnosis not present

## 2013-04-17 DIAGNOSIS — E7800 Pure hypercholesterolemia, unspecified: Secondary | ICD-10-CM | POA: Diagnosis present

## 2013-04-17 DIAGNOSIS — I2699 Other pulmonary embolism without acute cor pulmonale: Secondary | ICD-10-CM

## 2013-04-17 DIAGNOSIS — I82409 Acute embolism and thrombosis of unspecified deep veins of unspecified lower extremity: Secondary | ICD-10-CM | POA: Diagnosis present

## 2013-04-17 DIAGNOSIS — Z7982 Long term (current) use of aspirin: Secondary | ICD-10-CM | POA: Diagnosis not present

## 2013-04-17 DIAGNOSIS — I517 Cardiomegaly: Secondary | ICD-10-CM

## 2013-04-17 DIAGNOSIS — R55 Syncope and collapse: Secondary | ICD-10-CM

## 2013-04-17 DIAGNOSIS — I824Z9 Acute embolism and thrombosis of unspecified deep veins of unspecified distal lower extremity: Secondary | ICD-10-CM | POA: Diagnosis not present

## 2013-04-17 DIAGNOSIS — F3289 Other specified depressive episodes: Secondary | ICD-10-CM | POA: Diagnosis present

## 2013-04-17 DIAGNOSIS — R0902 Hypoxemia: Secondary | ICD-10-CM

## 2013-04-17 DIAGNOSIS — IMO0002 Reserved for concepts with insufficient information to code with codable children: Secondary | ICD-10-CM

## 2013-04-17 DIAGNOSIS — N179 Acute kidney failure, unspecified: Secondary | ICD-10-CM | POA: Diagnosis present

## 2013-04-17 DIAGNOSIS — M1610 Unilateral primary osteoarthritis, unspecified hip: Secondary | ICD-10-CM | POA: Diagnosis present

## 2013-04-17 DIAGNOSIS — I959 Hypotension, unspecified: Secondary | ICD-10-CM | POA: Diagnosis present

## 2013-04-17 DIAGNOSIS — I824Y9 Acute embolism and thrombosis of unspecified deep veins of unspecified proximal lower extremity: Secondary | ICD-10-CM | POA: Diagnosis not present

## 2013-04-17 DIAGNOSIS — I152 Hypertension secondary to endocrine disorders: Secondary | ICD-10-CM | POA: Diagnosis present

## 2013-04-17 DIAGNOSIS — F411 Generalized anxiety disorder: Secondary | ICD-10-CM | POA: Diagnosis present

## 2013-04-17 DIAGNOSIS — I951 Orthostatic hypotension: Secondary | ICD-10-CM | POA: Diagnosis present

## 2013-04-17 DIAGNOSIS — Z5181 Encounter for therapeutic drug level monitoring: Secondary | ICD-10-CM

## 2013-04-17 DIAGNOSIS — N189 Chronic kidney disease, unspecified: Secondary | ICD-10-CM

## 2013-04-17 DIAGNOSIS — R791 Abnormal coagulation profile: Secondary | ICD-10-CM

## 2013-04-17 DIAGNOSIS — Z96649 Presence of unspecified artificial hip joint: Secondary | ICD-10-CM | POA: Diagnosis not present

## 2013-04-17 DIAGNOSIS — E1169 Type 2 diabetes mellitus with other specified complication: Secondary | ICD-10-CM

## 2013-04-17 DIAGNOSIS — Z79899 Other long term (current) drug therapy: Secondary | ICD-10-CM | POA: Diagnosis not present

## 2013-04-17 DIAGNOSIS — Z86711 Personal history of pulmonary embolism: Secondary | ICD-10-CM | POA: Diagnosis present

## 2013-04-17 DIAGNOSIS — F329 Major depressive disorder, single episode, unspecified: Secondary | ICD-10-CM | POA: Diagnosis present

## 2013-04-17 DIAGNOSIS — R079 Chest pain, unspecified: Secondary | ICD-10-CM | POA: Diagnosis not present

## 2013-04-17 DIAGNOSIS — R0602 Shortness of breath: Secondary | ICD-10-CM | POA: Diagnosis not present

## 2013-04-17 DIAGNOSIS — J9600 Acute respiratory failure, unspecified whether with hypoxia or hypercapnia: Secondary | ICD-10-CM | POA: Diagnosis present

## 2013-04-17 DIAGNOSIS — M1710 Unilateral primary osteoarthritis, unspecified knee: Secondary | ICD-10-CM | POA: Diagnosis present

## 2013-04-17 DIAGNOSIS — R000 Tachycardia, unspecified: Secondary | ICD-10-CM

## 2013-04-17 DIAGNOSIS — K219 Gastro-esophageal reflux disease without esophagitis: Secondary | ICD-10-CM

## 2013-04-17 DIAGNOSIS — I10 Essential (primary) hypertension: Secondary | ICD-10-CM | POA: Diagnosis present

## 2013-04-17 DIAGNOSIS — E785 Hyperlipidemia, unspecified: Secondary | ICD-10-CM

## 2013-04-17 DIAGNOSIS — E1159 Type 2 diabetes mellitus with other circulatory complications: Secondary | ICD-10-CM | POA: Diagnosis present

## 2013-04-17 LAB — I-STAT CG4 LACTIC ACID, ED: Lactic Acid, Venous: 0.78 mmol/L (ref 0.5–2.2)

## 2013-04-17 LAB — CBC WITH DIFFERENTIAL/PLATELET
Basophils Absolute: 0 10*3/uL (ref 0.0–0.1)
Basophils Relative: 0 % (ref 0–1)
EOS PCT: 1 % (ref 0–5)
Eosinophils Absolute: 0.1 10*3/uL (ref 0.0–0.7)
HCT: 39 % (ref 39.0–52.0)
HEMOGLOBIN: 13.8 g/dL (ref 13.0–17.0)
Lymphocytes Relative: 13 % (ref 12–46)
Lymphs Abs: 0.9 10*3/uL (ref 0.7–4.0)
MCH: 32.5 pg (ref 26.0–34.0)
MCHC: 35.4 g/dL (ref 30.0–36.0)
MCV: 91.8 fL (ref 78.0–100.0)
MONOS PCT: 10 % (ref 3–12)
Monocytes Absolute: 0.7 10*3/uL (ref 0.1–1.0)
NEUTROS PCT: 76 % (ref 43–77)
Neutro Abs: 5.3 10*3/uL (ref 1.7–7.7)
Platelets: 163 10*3/uL (ref 150–400)
RBC: 4.25 MIL/uL (ref 4.22–5.81)
RDW: 14 % (ref 11.5–15.5)
WBC: 6.9 10*3/uL (ref 4.0–10.5)

## 2013-04-17 LAB — I-STAT CHEM 8, ED
BUN: 42 mg/dL — ABNORMAL HIGH (ref 6–23)
CHLORIDE: 107 meq/L (ref 96–112)
Calcium, Ion: 1.2 mmol/L (ref 1.13–1.30)
Creatinine, Ser: 2.1 mg/dL — ABNORMAL HIGH (ref 0.50–1.35)
Glucose, Bld: 102 mg/dL — ABNORMAL HIGH (ref 70–99)
HEMATOCRIT: 37 % — AB (ref 39.0–52.0)
Hemoglobin: 12.6 g/dL — ABNORMAL LOW (ref 13.0–17.0)
POTASSIUM: 4.8 meq/L (ref 3.7–5.3)
Sodium: 139 mEq/L (ref 137–147)
TCO2: 21 mmol/L (ref 0–100)

## 2013-04-17 LAB — URINALYSIS, ROUTINE W REFLEX MICROSCOPIC
Bilirubin Urine: NEGATIVE
GLUCOSE, UA: NEGATIVE mg/dL
HGB URINE DIPSTICK: NEGATIVE
Ketones, ur: NEGATIVE mg/dL
Nitrite: NEGATIVE
PROTEIN: NEGATIVE mg/dL
Specific Gravity, Urine: 1.025 (ref 1.005–1.030)
Urobilinogen, UA: 0.2 mg/dL (ref 0.0–1.0)
pH: 5 (ref 5.0–8.0)

## 2013-04-17 LAB — GLUCOSE, CAPILLARY: Glucose-Capillary: 102 mg/dL — ABNORMAL HIGH (ref 70–99)

## 2013-04-17 LAB — COMPREHENSIVE METABOLIC PANEL
ALK PHOS: 40 U/L (ref 39–117)
ALT: 43 U/L (ref 0–53)
AST: 41 U/L — AB (ref 0–37)
Albumin: 3.9 g/dL (ref 3.5–5.2)
BILIRUBIN TOTAL: 0.6 mg/dL (ref 0.3–1.2)
BUN: 44 mg/dL — ABNORMAL HIGH (ref 6–23)
CHLORIDE: 105 meq/L (ref 96–112)
CO2: 19 meq/L (ref 19–32)
CREATININE: 2.06 mg/dL — AB (ref 0.50–1.35)
Calcium: 9 mg/dL (ref 8.4–10.5)
GFR calc Af Amer: 37 mL/min — ABNORMAL LOW (ref >90)
GFR calc non Af Amer: 32 mL/min — ABNORMAL LOW (ref >90)
Glucose, Bld: 113 mg/dL — ABNORMAL HIGH (ref 70–99)
POTASSIUM: 5.2 meq/L (ref 3.7–5.3)
SODIUM: 138 meq/L (ref 137–147)
Total Protein: 7 g/dL (ref 6.0–8.3)

## 2013-04-17 LAB — I-STAT TROPONIN, ED: Troponin i, poc: 0.01 ng/mL (ref 0.00–0.08)

## 2013-04-17 LAB — POCT INR: INR: 2.1

## 2013-04-17 LAB — URINE MICROSCOPIC-ADD ON

## 2013-04-17 LAB — PROTIME-INR
INR: 1.84 — ABNORMAL HIGH (ref 0.00–1.49)
Prothrombin Time: 20.7 seconds — ABNORMAL HIGH (ref 11.6–15.2)

## 2013-04-17 LAB — HEPARIN LEVEL (UNFRACTIONATED): Heparin Unfractionated: 0.89 IU/mL — ABNORMAL HIGH (ref 0.30–0.70)

## 2013-04-17 LAB — TROPONIN I: Troponin I: <0.3 ng/mL (ref <0.30)

## 2013-04-17 MED ORDER — ACETAMINOPHEN 325 MG PO TABS: 650.0000 mg | ORAL_TABLET | Freq: Four times a day (QID) | ORAL | Status: DC | PRN

## 2013-04-17 MED ORDER — VENLAFAXINE HCL ER 225 MG PO TB24: 225.0000 mg | ORAL_TABLET | Freq: Every day | ORAL | Status: DC

## 2013-04-17 MED ORDER — VENLAFAXINE HCL ER 75 MG PO CP24
225.0000 mg | ORAL_CAPSULE | Freq: Every day | ORAL | Status: DC
  Administered 2013-04-18 – 2013-04-21 (×4): 225 mg via ORAL
  Filled 2013-04-17 (×5): qty 1

## 2013-04-17 MED ORDER — ACETAMINOPHEN 650 MG RE SUPP: 650.0000 mg | Freq: Four times a day (QID) | RECTAL | Status: DC | PRN

## 2013-04-17 MED ORDER — PANTOPRAZOLE SODIUM 40 MG PO TBEC
40.0000 mg | DELAYED_RELEASE_TABLET | Freq: Every day | ORAL | Status: DC
  Administered 2013-04-18 – 2013-04-21 (×4): 40 mg via ORAL
  Filled 2013-04-17 (×4): qty 1

## 2013-04-17 MED ORDER — SODIUM CHLORIDE 0.9 % IV SOLN: INTRAVENOUS | Status: DC

## 2013-04-17 MED ORDER — SODIUM CHLORIDE 0.9 % IV SOLN: 250.0000 mL | INTRAVENOUS | Status: DC | PRN

## 2013-04-17 MED ORDER — QUETIAPINE FUMARATE ER 50 MG PO TB24
150.0000 mg | ORAL_TABLET | Freq: Every day | ORAL | Status: DC
  Administered 2013-04-17 – 2013-04-20 (×4): 150 mg via ORAL
  Filled 2013-04-17 (×5): qty 3

## 2013-04-17 MED ORDER — ALBUTEROL SULFATE (2.5 MG/3ML) 0.083% IN NEBU: 2.5000 mg | INHALATION_SOLUTION | RESPIRATORY_TRACT | Status: DC | PRN

## 2013-04-17 MED ORDER — SODIUM CHLORIDE 0.9 % IV BOLUS (SEPSIS)
1000.0000 mL | Freq: Once | INTRAVENOUS | Status: AC
  Administered 2013-04-17: 1000 mL via INTRAVENOUS

## 2013-04-17 MED ORDER — SIMVASTATIN 20 MG PO TABS
20.0000 mg | ORAL_TABLET | Freq: Every day | ORAL | Status: DC
  Administered 2013-04-18 – 2013-04-20 (×3): 20 mg via ORAL
  Filled 2013-04-17 (×5): qty 1

## 2013-04-17 MED ORDER — WARFARIN - PHARMACIST DOSING INPATIENT: Freq: Every day | Status: DC

## 2013-04-17 MED ORDER — GUAIFENESIN-DM 100-10 MG/5ML PO SYRP
5.0000 mL | ORAL_SOLUTION | ORAL | Status: DC | PRN
  Filled 2013-04-17: qty 5

## 2013-04-17 MED ORDER — SODIUM CHLORIDE 0.9 % IV SOLN
INTRAVENOUS | Status: DC
  Administered 2013-04-17: 16:00:00 via INTRAVENOUS

## 2013-04-17 MED ORDER — ONDANSETRON HCL 4 MG PO TABS: 4.0000 mg | ORAL_TABLET | Freq: Four times a day (QID) | ORAL | Status: DC | PRN

## 2013-04-17 MED ORDER — HEPARIN (PORCINE) IN NACL 100-0.45 UNIT/ML-% IJ SOLN
1300.0000 [IU]/h | INTRAMUSCULAR | Status: DC
  Filled 2013-04-17 (×2): qty 250

## 2013-04-17 MED ORDER — LORAZEPAM 1 MG PO TABS
1.0000 mg | ORAL_TABLET | Freq: Every day | ORAL | Status: DC
  Administered 2013-04-17 – 2013-04-20 (×4): 1 mg via ORAL
  Filled 2013-04-17 (×4): qty 1

## 2013-04-17 MED ORDER — ONDANSETRON HCL 4 MG/2ML IJ SOLN: 4.0000 mg | Freq: Four times a day (QID) | INTRAMUSCULAR | Status: DC | PRN

## 2013-04-17 MED ORDER — SODIUM CHLORIDE 0.9 % IJ SOLN
3.0000 mL | Freq: Two times a day (BID) | INTRAMUSCULAR | Status: DC
  Administered 2013-04-18 – 2013-04-21 (×6): 3 mL via INTRAVENOUS

## 2013-04-17 MED ORDER — ASPIRIN 81 MG PO CHEW
324.0000 mg | CHEWABLE_TABLET | ORAL | Status: AC
  Administered 2013-04-17: 243 mg via ORAL
  Filled 2013-04-17: qty 4

## 2013-04-17 MED ORDER — HEPARIN (PORCINE) IN NACL 100-0.45 UNIT/ML-% IJ SOLN
1800.0000 [IU]/h | INTRAMUSCULAR | Status: DC
  Administered 2013-04-17: 1800 [IU]/h via INTRAVENOUS
  Filled 2013-04-17 (×2): qty 250

## 2013-04-17 MED ORDER — ASPIRIN 300 MG RE SUPP: 300.0000 mg | RECTAL | Status: AC

## 2013-04-17 MED ORDER — DARIFENACIN HYDROBROMIDE ER 15 MG PO TB24
15.0000 mg | ORAL_TABLET | Freq: Every day | ORAL | Status: DC
  Administered 2013-04-18 – 2013-04-21 (×4): 15 mg via ORAL
  Filled 2013-04-17 (×4): qty 1

## 2013-04-17 MED ORDER — HEPARIN BOLUS VIA INFUSION
3000.0000 [IU] | Freq: Once | INTRAVENOUS | Status: AC
  Administered 2013-04-17: 3000 [IU] via INTRAVENOUS
  Filled 2013-04-17: qty 3000

## 2013-04-17 MED ORDER — WARFARIN SODIUM 7.5 MG PO TABS
7.5000 mg | ORAL_TABLET | Freq: Once | ORAL | Status: DC
  Filled 2013-04-17: qty 1

## 2013-04-17 NOTE — ED Notes (Signed)
Heart healthy diet ordered for patient.  

## 2013-04-17 NOTE — Progress Notes (Signed)
Pt stated he normally takes 1mg  po Ativan QHS. Medication is on pts home med list. Warren Lacy MD notified. New orders received. Will continue to monitor.

## 2013-04-17 NOTE — ED Notes (Signed)
Spoke with bed control, aware of admission.

## 2013-04-17 NOTE — Progress Notes (Signed)
ANTICOAGULATION CONSULT NOTE - Follow Up Consult  Pharmacy Consult for Heparin Indication: pulmonary embolus  No Known Allergies  Patient Measurements: Height: 5\' 11"  (180.3 cm) Weight: 292 lb (132.45 kg) IBW/kg (Calculated) : 75.3 Heparin Dosing Weight: 105 kg  Vital Signs: Temp: 98.3 F (36.8 C) (03/26 1142) Temp src: Oral (03/26 1142) BP: 115/59 mmHg (03/26 2200) Pulse Rate: 59 (03/26 2200)  Labs:  Recent Labs  04/17/13 1013 04/17/13 1200 04/17/13 1252 04/17/13 1416 04/17/13 1523 04/17/13 2150  HGB  --   --  13.8 12.6*  --   --   HCT  --   --  39.0 37.0*  --   --   PLT  --   --  163  --   --   --   LABPROT  --  20.7*  --   --   --   --   INR 2.1 1.84*  --   --   --   --   HEPARINUNFRC  --  <0.10*  --   --   --  0.89*  CREATININE  --  2.06*  --  2.10*  --   --   TROPONINI  --   --   --   --  <0.30  --     Estimated Creatinine Clearance: 47.4 ml/min (by C-G formula based on Cr of 2.1).    Assessment: 68 year old male started on anticoagulation for a PE 04/07/13.  His Lovenox was stopped on 3/23 due to a therapeutic INR value and he has continued on Coumadin.  Today his INR is 1.84 and he is to begin Heparin for bridging with concern for progressing PE.  During last week's admission he was therapeutic on Heparin at 1800 units/hr, which is the same rate suggested by the [place] nomogram.  8h heparin level = 0.89 on 1800 units/hr.  Heparin level drawn appropriately per RN.  Heparin infusing at peripheral site. No bleeding noted per RN's report.     Goal of Therapy:  Heparin level 0.3-0.7 units/ml Monitor platelets by anticoagulation protocol: Yes   Plan:  Decrease Heparin infusion rate to 1600 units/hr Follow up 6 hr heparin level with 5 am labs 04/18/13.  Nicole Cella, RPh Clinical Pharmacist Pager: [phone] 04/17/2013, 10:46 PM

## 2013-04-17 NOTE — ED Provider Notes (Addendum)
CSN: [id number]     Arrival date & time 04/17/13  1136 History   First MD Initiated Contact with Patient 04/17/13 1137     Chief Complaint  Patient presents with  . Near Syncope     (Consider location/radiation/quality/duration/timing/severity/associated sxs/prior Treatment) The history is provided by the patient.  Nicholas [name] is a 68 y.o. male of HTN, recent PE on coumadin here with SOB, near syncope. He was admitted a week ago for large pulmonary embolus. Was in ICU and was receiving heparin and the bridge to Coumadin and Lovenox. His sister several days ago and 2 days ago his Coumadin was supratherapeutic at 3 so he is told to stop taking it for one day and decreased to 5 mg daily. He has some intermittent shortness of breath at home. He was at the office for INR rechecked today and felt lightheaded dizzy and was noted to be hypotensive. Denies any chest pain. Denies fever, chills, vomiting.    Past Medical History  Diagnosis Date  . Hypertension   . High cholesterol   . Pulmonary embolism 04/07/2013  . DVT (deep venous thrombosis) 04/07/2013    "one in knee; one in calf"   . Arthritis     "knees, hips, hands" (04/09/2013)  . Bulging lumbar disc   . Anxiety   . Depression    Past Surgical History  Procedure Laterality Date  . Total hip arthroplasty Right 2004  . Total hip arthroplasty Left 2006  . Joint replacement    . Knee cartilage surgery Left 1965    "football injury"  . Knee arthroscopy Right 2013  . Cataract extraction w/ intraocular lens  implant, bilateral Bilateral ~ 2010  . Pilonidal cyst excision  1970's  . Cardiac catheterization  ~ 2005    "once"   History reviewed. No pertinent family history. History  Substance Use Topics  . Smoking status: Never Smoker   . Smokeless tobacco: Never Used  . Alcohol Use: 3.6 oz/week    6 Cans of beer per week    Review of Systems  Respiratory: Positive for shortness of breath.   Neurological: Positive for dizziness.   All other systems reviewed and are negative.      Allergies  Review of patient's allergies indicates not on file.  Home Medications   Current Outpatient Rx  Name  Route  Sig  Dispense  Refill  . aspirin 81 MG tablet   Oral   Take 81 mg by mouth daily.         . AZOR 10-40 MG per tablet   Oral   Take 1 tablet by mouth daily.         . Fish Oil-Cholecalciferol (FISH OIL + D3 PO)   Oral   Take 2 tablets by mouth daily.         Marland Kitchen gabapentin (NEURONTIN) 300 MG capsule   Oral   Take 300-600 mg by mouth See admin instructions. Take 600 mg in the morning and 300 mg in the evening         . HYDROcodone-acetaminophen (NORCO/VICODIN) 5-325 MG per tablet   Oral   Take 1 tablet by mouth 2 (two) times daily as needed.         . hyoscyamine (ANASPAZ) 0.125 MG TBDP disintergrating tablet   Oral   Take 0.125 mg by mouth 4 (four) times daily as needed.          . meloxicam (MOBIC) 15 MG tablet   Oral  Take 15 mg by mouth daily.          . Multiple Vitamin (MULTIVITAMIN) tablet   Oral   Take 1 tablet by mouth daily.         Marland Kitchen omeprazole (PRILOSEC) 40 MG capsule   Oral   Take 40 mg by mouth daily.          Marland Kitchen SALINE MIST SPRAY NA   Each Nare   Place 2 sprays into both nostrils at bedtime.         . SEROQUEL XR 150 MG 24 hr tablet   Oral   Take 150 mg by mouth at bedtime.          . simvastatin (ZOCOR) 20 MG tablet   Oral   Take 20 mg by mouth daily.          . Venlafaxine HCl 225 MG TB24   Oral   Take 225 mg by mouth daily.          . VESICARE 10 MG tablet   Oral   Take 10 mg by mouth daily.          Marland Kitchen warfarin (COUMADIN) 5 MG tablet   Oral   Take 5-10 mg by mouth See admin instructions. Take 5 mg on Sunday, Tuesday, Thursday, and Saturday. Take 10 mg on Monday, Wednesday, and Friday.          BP 124/66  Pulse 67  Temp(Src) 98.3 F (36.8 C) (Oral)  Resp 22  Ht 5\' 11"  (1.803 m)  Wt 292 lb (132.45 kg)  BMI 40.74 kg/m2  SpO2  96% Physical Exam  Nursing note and vitals reviewed. Constitutional: He is oriented to person, place, and time. He appears well-developed and well-nourished.  HENT:  Head: Normocephalic.  Mouth/Throat: Oropharynx is clear and moist.  Eyes: Conjunctivae and EOM are normal. Pupils are equal, round, and reactive to light.  Neck: Normal range of motion. Neck supple.  Cardiovascular: Normal rate, regular rhythm and normal heart sounds.   Pulmonary/Chest: Effort normal and breath sounds normal. No respiratory distress. He has no wheezes. He has no rales.  Abdominal: Soft. Bowel sounds are normal. He exhibits no distension. There is no tenderness. There is no rebound and no guarding.  Musculoskeletal: Normal range of motion.  Neurological: He is alert and oriented to person, place, and time. No cranial nerve deficit. Coordination normal.  Skin: Skin is warm and dry.  Psychiatric: He has a normal mood and affect. His behavior is normal. Judgment and thought content normal.    ED Course  Procedures (including critical care time)  CRITICAL CARE Performed by: Darl Householder, DAVID   Total critical care time:30 min   Critical care time was exclusive of separately billable procedures and treating other patients.  Critical care was necessary to treat or prevent imminent or life-threatening deterioration.  Critical care was time spent personally by me on the following activities: development of treatment plan with patient and/or surrogate as well as nursing, discussions with consultants, evaluation of patient's response to treatment, examination of patient, obtaining history from patient or surrogate, ordering and performing treatments and interventions, ordering and review of laboratory studies, ordering and review of radiographic studies, pulse oximetry and re-evaluation of patient's condition.    EMERGENCY DEPARTMENT [place] CARDIAC EXAM "Study: Limited Ultrasound of the heart and  pericardium"  INDICATIONS:Hypotension Multiple views of the heart and pericardium are obtained with a multi-frequency probe.  PERFORMED VO:HYWVPX  IMAGES ARCHIVED?: Yes  FINDINGS: No pericardial  effusion and Normal contractility  LIMITATIONS:  Body habitus and Emergent procedure  VIEWS USED: Subcostal 4 chamber, Parasternal long axis, Parasternal short axis and Apical 4 chamber   INTERPRETATION: Cardiac activity present  COMMENT:  RV dilated consistent with known PE    Labs Review Labs Reviewed  COMPREHENSIVE METABOLIC PANEL - Abnormal; Notable for the following:    Glucose, Bld 113 (*)    BUN 44 (*)    Creatinine, Ser 2.06 (*)    AST 41 (*)    GFR calc non Af Amer 32 (*)    GFR calc Af Amer 37 (*)    All other components within normal limits  PROTIME-INR - Abnormal; Notable for the following:    Prothrombin Time 20.7 (*)    INR 1.84 (*)    All other components within normal limits  I-STAT CHEM 8, ED - Abnormal; Notable for the following:    BUN 42 (*)    Creatinine, Ser 2.10 (*)    Glucose, Bld 102 (*)    Hemoglobin 12.6 (*)    HCT 37.0 (*)    All other components within normal limits  CBC WITH DIFFERENTIAL  CBC WITH DIFFERENTIAL  URINALYSIS, ROUTINE W REFLEX MICROSCOPIC  HEPARIN LEVEL (UNFRACTIONATED)  I-STAT TROPOININ, ED  I-STAT CG4 LACTIC ACID, ED   Imaging Review Dg Chest Portable 1 View  04/17/2013   CLINICAL DATA:  Shortness of breath  EXAM: PORTABLE CHEST - 1 VIEW  COMPARISON:  04/08/2013  FINDINGS: There is persistent elevation the right hemidiaphragm. The cardiac shadow is stable. The lungs are clear bilaterally. No acute bony abnormality is noted.  IMPRESSION: No acute abnormality seen.   Electronically Signed   By: Inez Catalina M.D.   On: 04/17/2013 13:16     EKG Interpretation   Date/Time:  Thursday April 17 2013 11:25:05 EDT Ventricular Rate:  56 PR Interval:  269 QRS Duration: 112 QT Interval:  437 QTC Calculation: 422 R Axis:   70 Text  Interpretation:  Sinus rhythm Prolonged PR interval Borderline  intraventricular conduction delay Nonspecific T abnormalities, anterior  leads No previous ECGs available Confirmed by YAO  MD, DAVID ([id number]) on  04/17/2013 12:01:51 PM      MDM   Final diagnoses:  None   Nicholas [name] is a 68 y.o. male here with SOB, hypotension. Concerned for progressing PE failed treatment. Will repeat CT angio. No other signs of sepsis other than hypotension. Will hydrate and reassess.   12 pm  Patient's Cr 2.0, unable to get CT. I called radiologist, who will have interventional radiologist evaluate. I called Dr. Laurence Ferrari, who will see patient. Recommend critical care consult. I called Dr. Edison Nasuti from critical care, who will evaluate and states that patient doesn't need ICU since hemodynamically improved.   2:55 PM Cr. Still 2 after 2 L bolus. Given heparin since INR subtherapuetic. I performed bedside US that showed some right heart strain, similar to previous echo. 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      Wandra Arthurs, MD 04/17/13 [place], MD 04/17/13 [phone]

## 2013-04-17 NOTE — Progress Notes (Signed)
[facility] Progress Note Patient Name: Nicholas [name] DOB: 1945/07/01 MRN: [id number]  Date of Service  04/17/2013   HPI/Events of Note   Echo shows no severe RV strain, RV is better vs echo from 04/08/13. Pt is comfortable in bed on admission  eICU Interventions  No indication for TPA Cont hep drip   Intervention Category Intermediate Interventions: Diagnostic test evaluation  Asencion Noble 04/17/2013, 9:08 PM

## 2013-04-17 NOTE — ED Notes (Signed)
Pt attempting to void.  

## 2013-04-17 NOTE — Progress Notes (Addendum)
ANTICOAGULATION CONSULT NOTE - Follow Up Consult  Pharmacy Consult for Heparin Indication: pulmonary embolus  Not on File  Patient Measurements: Height: 5\' 11"  (180.3 cm) Weight: 292 lb (132.45 kg) IBW/kg (Calculated) : 75.3 Heparin Dosing Weight: 105 kg  Vital Signs: Temp: 98.3 F (36.8 C) (03/26 1142) Temp src: Oral (03/26 1142) BP: 101/62 mmHg (03/26 1300) Pulse Rate: 59 (03/26 1300)  Labs:  Recent Labs  04/14/13 1452 04/17/13 1013 04/17/13 1200 04/17/13 1252  HGB  --   --   --  13.8  HCT  --   --   --  39.0  PLT  --   --   --  163  LABPROT  --   --  20.7*  --   INR 3.2 2.1 1.84*  --   CREATININE  --   --  2.06*  --     Estimated Creatinine Clearance: 48.3 ml/min (by C-G formula based on Cr of 2.06).   Medications:  Infusions:   Assessment: 68 year old male started on anticoagulation for a PE 04/07/13.  His Lovenox was stopped on 3/23 due to a therapeutic INR value and he has continued on Coumadin.  Today his INR is 1.84 and he is to begin Heparin for bridging with concern for progressing PE.  During last week's admission he was therapeutic on Heparin at 1800 units/hr, which is the same rate suggested by the [place] nomogram.  Goal of Therapy:  Heparin level 0.3-0.7 units/ml Monitor platelets by anticoagulation protocol: Yes   Plan:  Give Heparin 3000 units IV bolus Start Heparin infusion at 1800 units/hr 8hr Heparin level  Legrand Como, Pharm.D., BCPS, AAHIVP Clinical Pharmacist Phone: [phone] or [phone] 04/17/2013, 1:29 PM   Addendum: Pharmacy consulted to resume coumadin therapy along with heparin bridge. Patient takes 5 mg on Sun, Tues, Thurs, Saturday; and 10 mg on Mon, Wed, Fri. Patient confirms his last dose of coumadin was yesterday. INR today was subtherapeutic at 1.84.   Plan: 1) Give coumadin 7.5 mg x 1 dose tonight  2) Monitor daily INR and s/s of bleeding   Albertina Parr, PharmD.  Clinical Pharmacist Pager [phone]

## 2013-04-17 NOTE — Progress Notes (Signed)
Pt was here for Coumadin check, s/p PE 1 week ago.  As pt was walking down hall became lightheaded and dizzy.  Set pt in chair in hallway and checked BP, BP 60/40.  Called pt's primary MD who manages pt's BP and medications discussed findings and they recommended pt go to the ER.  Advised pt of their recommendations, pt was hesitant at first to go.  Check INR 2.1, gave dosage instructions and made f/u appt in Coumadin Clinic at [facility] office.  Pt's BP was rechecked 68/44, advised pt to go to ER for further evaluation.  At that time pt started having pain in chest and became clammy.  Pt's oxygen sat was checked 91%RA, nasal cannula oxygen started at 2 L sats rose to 94%.  An IV of NS was started in R hand.  Pt reported CP improved, but still felt weak.  BP rechecked 68/44.  Sats decreased to 91% oxygen was increased to 4L [place].  EMS arrived, Pt hooked up to telemetry, tranfered to their oxygen and transported to [facility].

## 2013-04-17 NOTE — Progress Notes (Signed)
Echocardiogram 2D Echocardiogram has been performed.  Nicholas [name] 04/17/2013, 7:33 PM

## 2013-04-17 NOTE — ED Notes (Signed)
Pharmacy aware of heparin order.

## 2013-04-17 NOTE — ED Notes (Signed)
Pt denies complaints at this time. Echo in process. Pt ready for transfer to ICU when echo complete. Pt stood to void with one assist denies dizziness with standing.

## 2013-04-17 NOTE — ED Notes (Signed)
Pt arrived via ems from pmd related to near syncopal episode with cp. Per ems pt was hypotensive, clammy and spo2 89 on arrival. Pt states had similar episode this am. Pt is alert x4 , respirations easy non labored. Continues to c/o dizziness, denies cp at this time. MD notified.

## 2013-04-17 NOTE — H&P (Signed)
PULMONARY / CRITICAL CARE MEDICINE  Name: Nicholas [name] MRN: [id number] DOB: [birth year]    ADMISSION DATE:  04/17/2013 CONSULTATION DATE:  04/17/2013  REFERRING MD :  EDP PRIMARY SERVICE: PCCM  CHIEF COMPLAINT:  Hypotension, SOB  BRIEF PATIENT DESCRIPTION: 68 yo discharged 3/20 on Coumdin after hospitalization for PE.  Reports being compliant. Went to Coumadin clinic 3/26 and became lightheaded, diaphoretic, hypotensive.  Brought back to ED.  PCCM asked to admit.  SIGNIFICANT EVENTS / STUDIES:  3/20 - discharged after suffering PE.  Workup during hospitalization showed + saddle PE, + DVT in right leg. 3/23 - went to Coumadin clinic, INR supratherapeutic, told to hold dose next day, went back today and become symptomatic. 3/26 - readmitted for similar symptoms.  LINES / TUBES:  CULTURES:  ANTIBIOTICS:  HISTORY OF PRESENT ILLNESS:  Nicholas [name] is a 68 y.o. M who was recently discharged from [facility] on 3/20 after he was treated for a PE with DVT.  He was discharged with instructions to follow up with the Coumadin clinic for INR monitoring.  He states that the day after discharge, he felt weak and "it wasn't a great day".  The next few days he was normal and felt great.  On Monday 3/23, he went in for an INR check and INR was supratherapeutic.  He was told to hold his Coumadin the next day.  Today, while getting ready to go to the clinic, he began to experience SOB, chest pain, and became diaphoretic.  Symptoms resolved on their own shortly thereafter so he and his wife decided to go to his appointment anyway.  At his appointment, symptoms recurred.  As he was walking down the hall, he became lightheaded and dizzy.  Pt was hypotensive (60/40) and SpO2 was 89%.   EMS dispatched, pt brought back to ED. In ED, CXR was obtained and was negative.  EKG showed NSR with nonspecific T wave abnormalities.  He was given 2L IVF bolus and BP responded well. PCCM asked to admit.  PAST MEDICAL HISTORY :  Past  Medical History  Diagnosis Date  . Hypertension   . High cholesterol   . Pulmonary embolism 04/07/2013  . DVT (deep venous thrombosis) 04/07/2013    "one in knee; one in calf"   . Arthritis     "knees, hips, hands" (04/09/2013)  . Bulging lumbar disc   . Anxiety   . Depression    Past Surgical History  Procedure Laterality Date  . Total hip arthroplasty Right 2004  . Total hip arthroplasty Left 2006  . Joint replacement    . Knee cartilage surgery Left 1965    "football injury"  . Knee arthroscopy Right 2013  . Cataract extraction w/ intraocular lens  implant, bilateral Bilateral ~ 2010  . Pilonidal cyst excision  1970's  . Cardiac catheterization  ~ 2005    "once"   Prior to Admission medications   Medication Sig Start Date End Date Taking? Authorizing Provider  aspirin 81 MG tablet Take 81 mg by mouth daily.   Yes Historical Provider, MD  AZOR 10-40 MG per tablet Take 1 tablet by mouth daily. 04/04/13  Yes Historical Provider, MD  Fish Oil-Cholecalciferol (FISH OIL + D3 PO) Take 2 tablets by mouth daily.   Yes Historical Provider, MD  gabapentin (NEURONTIN) 300 MG capsule Take 300-600 mg by mouth See admin instructions. Take 600 mg in the morning and 300 mg in the evening 03/10/13  Yes Historical Provider, MD  HYDROcodone-acetaminophen (NORCO/VICODIN) 5-325  MG per tablet Take 1 tablet by mouth 2 (two) times daily as needed. 04/04/13  Yes Historical Provider, MD  hyoscyamine (ANASPAZ) 0.125 MG TBDP disintergrating tablet Take 0.125 mg by mouth 4 (four) times daily as needed.  03/27/13  Yes Historical Provider, MD  meloxicam (MOBIC) 15 MG tablet Take 15 mg by mouth daily.  03/10/13  Yes Historical Provider, MD  Multiple Vitamin (MULTIVITAMIN) tablet Take 1 tablet by mouth daily.   Yes Historical Provider, MD  omeprazole (PRILOSEC) 40 MG capsule Take 40 mg by mouth daily.  03/27/13  Yes Historical Provider, MD  SALINE MIST SPRAY NA Place 2 sprays into both nostrils at bedtime.   Yes  Historical Provider, MD  SEROQUEL XR 150 MG 24 hr tablet Take 150 mg by mouth at bedtime.  01/28/13  Yes Historical Provider, MD  simvastatin (ZOCOR) 20 MG tablet Take 20 mg by mouth daily.  03/10/13  Yes Historical Provider, MD  Venlafaxine HCl 225 MG TB24 Take 225 mg by mouth daily.  02/22/13  Yes Historical Provider, MD  VESICARE 10 MG tablet Take 10 mg by mouth daily.  02/24/13  Yes Historical Provider, MD  warfarin (COUMADIN) 5 MG tablet Take 5-10 mg by mouth See admin instructions. Take 5 mg on Sunday, Tuesday, Thursday, and Saturday. Take 10 mg on Monday, Wednesday, and Friday.   Yes Historical Provider, MD   Not on File  FAMILY HISTORY:  History reviewed. No pertinent family history.  SOCIAL HISTORY:  reports that he has never smoked. He has never used smokeless tobacco. He reports that he drinks about 3.6 ounces of alcohol per week. He reports that he does not use illicit drugs.  REVIEW OF SYSTEMS:  Review of Systems  Constitutional: Positive for diaphoresis. Negative for fever, chills, weight loss and malaise/fatigue.  HENT: Negative.   Eyes: Negative.   Respiratory: Positive for shortness of breath. Negative for cough, hemoptysis, sputum production and wheezing.   Cardiovascular: Positive for chest pain. Negative for palpitations, orthopnea, claudication, leg swelling and PND.  Gastrointestinal: Negative.   Genitourinary: Negative.   Musculoskeletal: Negative.   Skin: Negative for itching and rash.  Neurological: Negative.  Negative for weakness.  Endo/Heme/Allergies: Negative.   Psychiatric/Behavioral: Negative.    SUBJECTIVE: States he feels much better now compared to how he did at Coumadin Clinic/at symptom onset.  Is no longer having chest pains and is no longer SOB. Vitals stable.  VITAL SIGNS: Temp:  [98.3 F (36.8 C)] 98.3 F (36.8 C) (03/26 1142) Pulse Rate:  [57-61] 60 (03/26 1400) Resp:  [16-19] 18 (03/26 1400) BP: (89-103)/(52-64) 103/53 mmHg (03/26 1400) SpO2:   [89 %-95 %] 95 % (03/26 1400) Weight:  [292 lb (132.45 kg)] 292 lb (132.45 kg) (03/26 1142)  HEMODYNAMICS:   VENTILATOR SETTINGS:   INTAKE / OUTPUT: Intake/Output   None    PHYSICAL EXAMINATION: General: Pleasant male, resting in stretcher, in NAD. Neuro: A&O x 3, non-focal.  HEENT: [place]/AT. PERRL, sclerae anicteric. Cardiovascular: RRR, no M/R/G.  Lungs: Respirations even and unlabored.  CTA bilaterally, No W/R/R.  Abdomen: BS x 4, soft, NT/ND.  Musculoskeletal: No gross deformities, no edema.  Skin: Intact, warm, no rashes.  LABS:  CBC  Recent Labs Lab 04/11/13 0553 04/17/13 1252 04/17/13 1416  WBC 7.8 6.9  --   HGB 14.5 13.8 12.6*  HCT 41.7 39.0 37.0*  PLT 126* 163  --    Coag's  Recent Labs Lab 04/14/13 1452 04/17/13 1013 04/17/13 1200  INR 3.2 2.1  1.84*   BMET  Recent Labs Lab 04/17/13 1200 04/17/13 1416  NA 138 139  K 5.2 4.8  CL 105 107  CO2 19  --   BUN 44* 42*  CREATININE 2.06* 2.10*  GLUCOSE 113* 102*   Electrolytes  Recent Labs Lab 04/17/13 1200  CALCIUM 9.0   Sepsis Markers  Recent Labs Lab 04/17/13 1214  LATICACIDVEN 0.78   ABG No results found for this basename: PHART, PCO2ART, PO2ART,  in the last 168 hours Liver Enzymes  Recent Labs Lab 04/17/13 1200  AST 41*  ALT 43  ALKPHOS 40  BILITOT 0.6  ALBUMIN 3.9   Cardiac Enzymes No results found for this basename: TROPONINI, PROBNP,  in the last 168 hours Glucose No results found for this basename: GLUCAP,  in the last 168 hours  IMAGING:  Dg Chest Portable 1 View  04/17/2013   CLINICAL DATA:  Shortness of breath  EXAM: PORTABLE CHEST - 1 VIEW  COMPARISON:  04/08/2013  FINDINGS: There is persistent elevation the right hemidiaphragm. The cardiac shadow is stable. The lungs are clear bilaterally. No acute bony abnormality is noted.  IMPRESSION: No acute abnormality seen.   Electronically Signed   By: Inez Catalina M.D.   On: 04/17/2013 13:16   ASSESSMENT /  PLAN:  PULMONARY A: Recent PE / DVT Hypoxemic respiratory failure Worsening PE? Coumadin failure? P:   Supplemental oxygen Heparin gtt Venous Dopplers STAT, consider IVC filter if mobile clot  CARDIOVASCULAR A: Hypotension RV failure? P:  Trend troponin TTE STAT, consider catheter directed lysis if RV strain  RENAL A:   AKI P:   Trend CBC NS 1000 x 2 NS @ 100 Avoid nephrotoxins  GASTROINTESTINAL A:  Nutrition GI Px is not required P:   Diet  HEMATOLOGIC A:  VTE Possibility of Coumadin failure P:  Heparin gtt for now Consider alternative oral anticoagulation Trend CBC / INR  INFECTIOUS A:   No acute issues  P:  No intervention required  ENDOCRINE A:   No acute issues P:   No intervention required  NEUROLOGIC  A:   No acute issues P:   No intervention required  Montey Hora, PA - [facility] Pgr: [phone]  or [phone]  I have personally obtained history, examined patient, evaluated and interpreted laboratory and imaging results, reviewed medical records, formulated assessment / plan and placed orders.  CRITICAL CARE:  The patient is critically ill with multiple organ systems failure and requires high complexity decision making for assessment and support, frequent evaluation and titration of therapies, application of advanced monitoring technologies and extensive interpretation of multiple databases. Critical Care Time devoted to patient care services described in this note is 40 minutes.   Doree Fudge, MD Pulmonary and [place] Pager: [phone]  04/17/2013, 4:30 PM

## 2013-04-18 DIAGNOSIS — R55 Syncope and collapse: Secondary | ICD-10-CM

## 2013-04-18 DIAGNOSIS — I82409 Acute embolism and thrombosis of unspecified deep veins of unspecified lower extremity: Secondary | ICD-10-CM

## 2013-04-18 LAB — BASIC METABOLIC PANEL
BUN: 34 mg/dL — AB (ref 6–23)
CALCIUM: 8.6 mg/dL (ref 8.4–10.5)
CO2: 20 mEq/L (ref 19–32)
CREATININE: 1.17 mg/dL (ref 0.50–1.35)
Chloride: 105 mEq/L (ref 96–112)
GFR calc Af Amer: 73 mL/min — ABNORMAL LOW (ref >90)
GFR, EST NON AFRICAN AMERICAN: 63 mL/min — AB (ref >90)
GLUCOSE: 104 mg/dL — AB (ref 70–99)
Potassium: 4.2 mEq/L (ref 3.7–5.3)
SODIUM: 138 meq/L (ref 137–147)

## 2013-04-18 LAB — CBC
HCT: 37.2 % — ABNORMAL LOW (ref 39.0–52.0)
HEMOGLOBIN: 13.3 g/dL (ref 13.0–17.0)
MCH: 32.3 pg (ref 26.0–34.0)
MCHC: 35.8 g/dL (ref 30.0–36.0)
MCV: 90.3 fL (ref 78.0–100.0)
Platelets: 156 10*3/uL (ref 150–400)
RBC: 4.12 MIL/uL — ABNORMAL LOW (ref 4.22–5.81)
RDW: 13.7 % (ref 11.5–15.5)
WBC: 6.1 10*3/uL (ref 4.0–10.5)

## 2013-04-18 LAB — PHOSPHORUS: PHOSPHORUS: 3.2 mg/dL (ref 2.3–4.6)

## 2013-04-18 LAB — HEPARIN LEVEL (UNFRACTIONATED): Heparin Unfractionated: 0.93 IU/mL — ABNORMAL HIGH (ref 0.30–0.70)

## 2013-04-18 LAB — TROPONIN I: Troponin I: <0.3 ng/mL (ref <0.30)

## 2013-04-18 LAB — MAGNESIUM: Magnesium: 2.2 mg/dL (ref 1.5–2.5)

## 2013-04-18 MED ORDER — WARFARIN SODIUM 10 MG PO TABS
10.0000 mg | ORAL_TABLET | Freq: Once | ORAL | Status: AC
  Administered 2013-04-18: 10 mg via ORAL
  Filled 2013-04-18: qty 1

## 2013-04-18 MED ORDER — LORAZEPAM 1 MG PO TABS: 1.0000 mg | ORAL_TABLET | Freq: Every day | ORAL | Status: DC

## 2013-04-18 MED ORDER — ENOXAPARIN SODIUM 150 MG/ML ~~LOC~~ SOLN
135.0000 mg | Freq: Two times a day (BID) | SUBCUTANEOUS | Status: DC
  Administered 2013-04-18: 135 mg via SUBCUTANEOUS
  Administered 2013-04-18: 11:00:00 via SUBCUTANEOUS
  Administered 2013-04-19 – 2013-04-21 (×5): 135 mg via SUBCUTANEOUS
  Filled 2013-04-18 (×9): qty 1

## 2013-04-18 MED ORDER — WARFARIN - PHARMACIST DOSING INPATIENT
Freq: Every day | Status: DC
  Administered 2013-04-20: 18:00:00

## 2013-04-18 NOTE — Progress Notes (Signed)
VASCULAR LAB PRELIMINARY  PRELIMINARY  PRELIMINARY  PRELIMINARY  Bilateral lower extremity venous duplex  completed.    Preliminary report:  Right:  Subacute DVT noted in the popliteal vein and tibial peroneal trunk.  There is no propagation of thrombus since study of 04-08-13.  No evidence of superficial thrombosis.  No Baker's cyst.  Left:  No evidence of DVT, superficial thrombosis, or Baker's cyst.  Kirkland Figg, RVT 04/18/2013, 1:40 PM

## 2013-04-18 NOTE — Progress Notes (Signed)
04/18/13 !300 report received on patient. Going to room 5 W 08 with Dx of PE, currently on Heparin drip at 13 ml/hr, and NS at kvo,Diet carb mod, has 2 IV sites 3/26 Lt A/C & Rt Hand.Pt is daily weight of 135.7 kg. DVT Coumadin and Lovenox.

## 2013-04-18 NOTE — Progress Notes (Signed)
ANTICOAGULATION CONSULT NOTE - Follow Up Consult  Pharmacy Consult for lovenox and coumadin Indication: pulmonary embolus  Labs:  Recent Labs  04/17/13 1013 04/17/13 1200  04/17/13 1252 04/17/13 1416 04/17/13 1523 04/17/13 2150 04/18/13 0240  HGB  --   --   < > 13.8 12.6*  --   --  13.3  HCT  --   --   --  39.0 37.0*  --   --  37.2*  PLT  --   --   --  163  --   --   --  156  LABPROT  --  20.7*  --   --   --   --   --   --   INR 2.1 1.84*  --   --   --   --   --   --   HEPARINUNFRC  --  <0.10*  --   --   --   --  0.89* 0.93*  CREATININE  --  2.06*  --   --  2.10*  --   --  1.17  TROPONINI  --   --   --   --   --  <0.30 <0.30 <0.30  < > = values in this interval not displayed.   Assessment: 68yo male who was here and started on IV heparin for his subtherapeutic INR. He was on SQ lovenox and coumadin previously. Couamdin was not given last night. Changing him back to lovenox and restarting coumadin today. MD wants 2 days of therapeutic INR overlap.   Goal of Therapy:  Anti-Xa = 0.6-1.2 INR 2-3   Plan:   Dc heparin Lovenox 135mg  SQ q12 CBC q3days Coumadin 10mg  PO x1 today Daily INR

## 2013-04-18 NOTE — Progress Notes (Addendum)
PULMONARY / CRITICAL CARE MEDICINE  Name: Nicholas [name] MRN: [id number] DOB: 12-20-1945    ADMISSION DATE:  04/17/2013 CONSULTATION DATE:  04/17/2013  REFERRING MD :  EDP PRIMARY SERVICE: PCCM  CHIEF COMPLAINT:  Hypotension, SOB  BRIEF PATIENT DESCRIPTION: 68 yo discharged 3/20 on Coumdin after hospitalization for PE.  Reports being compliant. Went to Coumadin clinic 3/26 and became lightheaded, diaphoretic, hypotensive.  Brought back to ED.  PCCM asked to admit.  SIGNIFICANT EVENTS / STUDIES:  3/20 - discharged after hospitalization for PE.  Workup during hospitalization showed + saddle PE, + DVT in right leg. 3/23 - went to Coumadin clinic, INR supratherapeutic, told to hold dose next day, went back today and become symptomatic. 3/26 - readmitted for presyncope.  3/27 Repeat Echo: LVEF 45-50%. When compared to 04/08/13 - RV size and function appears improved. 3/27 LE venous Dopplers: Right: Subacute DVT noted in the popliteal vein and tibial peroneal trunk. There is no propagation of thrombus since study of 04-08-13. No evidence of superficial thrombosis. Left: No evidence of DVT, superficial thrombosis, or Baker's cyst.   SUBJ: No new complaints. No distress  OBJ: Filed Vitals:   04/18/13 1000 04/18/13 1100 04/18/13 1251 04/18/13 1504  BP: 123/67 121/50  118/74  Pulse: 55 57  57  Temp:   97.3 F (36.3 C) 98.6 F (37 C)  TempSrc:   Oral Oral  Resp: 16 17  18   Height:    5\' 11"  (1.803 m)  Weight:    136.306 kg (300 lb 8 oz)  SpO2: 96% 96%  97%    NAD No JVD Chest clear RRR s M NABS, soft No LE edema  I have reviewed all of today's lab results. Relevant abnormalities are discussed in the A/P section   No new CXR  IMPRESSION: 1) Presyncope 2) Recent PE/DVT - Dopplers and Echo results do not support recurrence. Suspect presyncope symptoms are attributable to resumption of his anti-hypertensives in setting of recent PE with persistent moderate RV strain. Doubt this  represents a warfarin failure and IVC filter does not seem to be warranted 3) AKI, resolved - likely prerenal and ARB/NSAID combination  PLAN: Transfer to telemetry Change UFH to LMWH Resume warfarin with goal INR 2.5 - 3.0 Holding anti-hypertensives Likely DC home Monday Will need to decide on proper anti-hypertensive regimen prior to discharge  Suggest avoiding combination of NSAID with either ACEI or ARB   Merton Border, MD ; [facility] service [place] [phone].  After 5:30 PM or weekends, call [phone]

## 2013-04-18 NOTE — Progress Notes (Signed)
UR Completed.  Tayler Heiden Jane [phone] 04/18/2013  

## 2013-04-18 NOTE — Progress Notes (Signed)
ANTICOAGULATION CONSULT NOTE - Follow Up Consult  Pharmacy Consult for heparin Indication: pulmonary embolus  Labs:  Recent Labs  04/17/13 1013 04/17/13 1200  04/17/13 1252 04/17/13 1416 04/17/13 1523 04/17/13 2150 04/18/13 0240  HGB  --   --   < > 13.8 12.6*  --   --  13.3  HCT  --   --   --  39.0 37.0*  --   --  37.2*  PLT  --   --   --  163  --   --   --  156  LABPROT  --  20.7*  --   --   --   --   --   --   INR 2.1 1.84*  --   --   --   --   --   --   HEPARINUNFRC  --  <0.10*  --   --   --   --  0.89* 0.93*  CREATININE  --  2.06*  --   --  2.10*  --   --   --   TROPONINI  --   --   --   --   --  <0.30 <0.30  --   < > = values in this interval not displayed.   Assessment: 68yo male now with higher heparin level despite decreased rate earlier.  Goal of Therapy:  Heparin level 0.3-0.7 units/ml   Plan:  Will decrease heparin gtt by 2-3 units/kg/hr to 1300 units/hr and check level in 6hr.  Wynona Neat, PharmD, BCPS  04/18/2013,3:28 AM

## 2013-04-19 LAB — CBC
HCT: 37.3 % — ABNORMAL LOW (ref 39.0–52.0)
HEMOGLOBIN: 13.2 g/dL (ref 13.0–17.0)
MCH: 32.2 pg (ref 26.0–34.0)
MCHC: 35.4 g/dL (ref 30.0–36.0)
MCV: 91 fL (ref 78.0–100.0)
Platelets: 157 10*3/uL (ref 150–400)
RBC: 4.1 MIL/uL — AB (ref 4.22–5.81)
RDW: 13.6 % (ref 11.5–15.5)
WBC: 4.6 10*3/uL (ref 4.0–10.5)

## 2013-04-19 LAB — PROTIME-INR
INR: 2.04 — ABNORMAL HIGH (ref 0.00–1.49)
PROTHROMBIN TIME: 22.4 s — AB (ref 11.6–15.2)

## 2013-04-19 MED ORDER — WARFARIN SODIUM 10 MG PO TABS
10.0000 mg | ORAL_TABLET | Freq: Once | ORAL | Status: AC
  Administered 2013-04-19: 10 mg via ORAL
  Filled 2013-04-19 (×2): qty 1

## 2013-04-19 NOTE — Discharge Instructions (Signed)

## 2013-04-19 NOTE — Progress Notes (Signed)
ANTICOAGULATION CONSULT NOTE - Follow Up Consult  Pharmacy Consult for Lovenox + Warfarin Indication: pulmonary embolus  No Known Allergies  Patient Measurements: Height: 5\' 11"  (180.3 cm) Weight: 300 lb 8 oz (136.306 kg) IBW/kg (Calculated) : 75.3  Vital Signs: Temp: 97.3 F (36.3 C) (03/28 0608) Temp src: Oral (03/28 0608) BP: 126/78 mmHg (03/28 0608) Pulse Rate: 65 (03/28 0608)  Labs:  Recent Labs  04/17/13 1013 04/17/13 1200  04/17/13 1252 04/17/13 1416 04/17/13 1523 04/17/13 2150 04/18/13 0240 04/19/13 0558  HGB  --   --   < > 13.8 12.6*  --   --  13.3 13.2  HCT  --   --   < > 39.0 37.0*  --   --  37.2* 37.3*  PLT  --   --   --  163  --   --   --  156 157  LABPROT  --  20.7*  --   --   --   --   --   --  22.4*  INR 2.1 1.84*  --   --   --   --   --   --  2.04*  HEPARINUNFRC  --  <0.10*  --   --   --   --  0.89* 0.93*  --   CREATININE  --  2.06*  --   --  2.10*  --   --  1.17  --   TROPONINI  --   --   --   --   --  <0.30 <0.30 <0.30  --   < > = values in this interval not displayed.  Estimated Creatinine Clearance: 86.4 ml/min (by C-G formula based on Cr of 1.17).   Assessment: 68 y.o. M who continues on lovenox bridge to a therapeutic INR with warfarin for a recent saddle PE discovered on 04/07/13. The patient was discharged on 3/20 and was on a dose of warfarin 5 mg daily EXCEPT for 10 mg on MWF. He was readmitted on 3/26 for concern of progressing PE and was reinitiated on bridging.  The patient's INR today is slightly SUBtherapeutic of CCM specified goal of 2.5-3 (INR 2.04 << 1.84, goal of 2.5-3). Will plan to continue bridging with lovenox until the INR has been therapeutic for 2 days -- can consider discontinuing tomorrow. Hgb/Hct/Plt okay -- no overt s/sx of bleeding noted.   Goal of Therapy:  Anti-Xa level 0.6-1 units/ml 4hrs after LMWH dose given INR 2.5-3 Monitor platelets by anticoagulation protocol: Yes   Plan:  1. Warfarin 10 mg x 1 dose at 1800  today 2. Continue Lovenox 135 mg SQ every 12 hours 3. Will continue to monitor for any signs/symptoms of bleeding and will follow up with PT/INR in the a.m.   Alycia Rossetti, PharmD, BCPS Clinical Pharmacist Pager: [phone] 04/19/2013 11:08 AM

## 2013-04-19 NOTE — Progress Notes (Signed)
PULMONARY / CRITICAL CARE MEDICINE  Name: Nicholas [name] MRN: [id number] DOB: 1945-07-11    ADMISSION DATE:  04/17/2013 CONSULTATION DATE:  04/17/2013  REFERRING MD :  EDP PRIMARY SERVICE: PCCM  CHIEF COMPLAINT:  Hypotension, SOB  BRIEF PATIENT DESCRIPTION: 68 yo discharged 3/20 on Coumdin after hospitalization for PE.  Reports being compliant. Went to Coumadin clinic 3/26 and became lightheaded, diaphoretic, hypotensive.  Brought back to ED.  PCCM asked to admit.  SIGNIFICANT EVENTS / STUDIES:  3/20 - discharged after hospitalization for PE.  Workup during hospitalization showed + saddle PE, + DVT in right leg. 3/23 - went to Coumadin clinic, INR supratherapeutic, told to hold dose next day, went back today and become symptomatic. 3/26 - readmitted for presyncope.  3/27 Repeat Echo: LVEF 45-50%. When compared to 04/08/13 - RV size and function appears improved. 3/27 LE venous Dopplers: Right: Subacute DVT noted in the popliteal vein and tibial peroneal trunk. There is no propagation of thrombus since study of 04-08-13. No evidence of superficial thrombosis. Left: No evidence of DVT, superficial thrombosis, or Baker's cyst.   SUBJ: No new issues overnight.  Has been trying to walk in halls.  No increased wob or further syncopal spells.  OBJDanley Danker Vitals:   04/18/13 1251 04/18/13 1504 04/18/13 2115 04/19/13 0608  BP:  118/74 119/87 126/78  Pulse:  57 67 65  Temp: 97.3 F (36.3 C) 98.6 F (37 C)  97.3 F (36.3 C)  TempSrc: Oral Oral Oral Oral  Resp:  18 18 20   Height:  5\' 11"  (1.803 m)    Weight:  136.306 kg (300 lb 8 oz)    SpO2:  97% 94% 96%    Gen:  Wd male in nad Nose without purulence or d/c noted. Neck without LN or TMG Chest clear to auscultation Cor with rrr LE without significant edema, no cyanosis Alert and oriented, moves all 4.     IMPRESSION: 1) Presyncope- no further episodes  2) Recent PE/DVT - Dopplers and Echo results do not support recurrence. Suspect  presyncope symptoms are attributable to resumption of his anti-hypertensives in setting of recent PE with persistent moderate RV strain. Venous dopplers have been repeated per pt, but I cannot find report in chart although they are ordered.  PLAN: -Holding anti-hypertensives, and will need to decide about meds at d/c.  Careful use of ACE given recent ARF -continue anticoagulation -ambulate.

## 2013-04-20 LAB — CBC
HCT: 37.6 % — ABNORMAL LOW (ref 39.0–52.0)
HEMOGLOBIN: 13.5 g/dL (ref 13.0–17.0)
MCH: 32.5 pg (ref 26.0–34.0)
MCHC: 35.9 g/dL (ref 30.0–36.0)
MCV: 90.4 fL (ref 78.0–100.0)
PLATELETS: 166 10*3/uL (ref 150–400)
RBC: 4.16 MIL/uL — ABNORMAL LOW (ref 4.22–5.81)
RDW: 13.5 % (ref 11.5–15.5)
WBC: 5.3 10*3/uL (ref 4.0–10.5)

## 2013-04-20 LAB — PROTIME-INR
INR: 2.1 — ABNORMAL HIGH (ref 0.00–1.49)
PROTHROMBIN TIME: 22.9 s — AB (ref 11.6–15.2)

## 2013-04-20 MED ORDER — WARFARIN SODIUM 2.5 MG PO TABS
12.5000 mg | ORAL_TABLET | Freq: Once | ORAL | Status: AC
  Administered 2013-04-20: 12.5 mg via ORAL
  Filled 2013-04-20: qty 1

## 2013-04-20 NOTE — Progress Notes (Signed)
ANTICOAGULATION CONSULT NOTE - Follow Up Consult  Pharmacy Consult for Lovenox + Warfarin Indication: pulmonary embolus  No Known Allergies  Patient Measurements: Height: 5\' 11"  (180.3 cm) Weight: 300 lb 8 oz (136.306 kg) IBW/kg (Calculated) : 75.3  Vital Signs: Temp: 98.1 F (36.7 C) (03/29 0555) Temp src: Oral (03/29 0555) BP: 122/74 mmHg (03/29 0555) Pulse Rate: 98 (03/29 0555)  Labs:  Recent Labs  04/17/13 1416 04/17/13 1523 04/17/13 2150 04/18/13 0240 04/19/13 0558 04/20/13 0530  HGB 12.6*  --   --  13.3 13.2 13.5  HCT 37.0*  --   --  37.2* 37.3* 37.6*  PLT  --   --   --  156 157 166  LABPROT  --   --   --   --  22.4* 22.9*  INR  --   --   --   --  2.04* 2.10*  HEPARINUNFRC  --   --  0.89* 0.93*  --   --   CREATININE 2.10*  --   --  1.17  --   --   TROPONINI  --  <0.30 <0.30 <0.30  --   --     Estimated Creatinine Clearance: 86.4 ml/min (by C-G formula based on Cr of 1.17).   Assessment: 68 y.o. M who continues on lovenox bridge to a therapeutic INR with warfarin for a recent saddle PE discovered on 04/07/13. The patient was discharged on 3/20 and was on a dose of warfarin 5 mg daily EXCEPT for 10 mg on MWF. He was readmitted on 3/26 for concern of progressing PE and was reinitiated on bridging.  The patient's INR today is slightly SUBtherapeutic of CCM specified goal of 2.5-3 (INR 2.1 << 2.04, goal of 2.5-3). Will plan to continue bridging with lovenox until the INR has been therapeutic for 2 days (per CCM specified range). Hgb/Hct/Plt okay -- no overt s/sx of bleeding noted.   Goal of Therapy:  Anti-Xa level 0.6-1 units/ml 4hrs after LMWH dose given INR 2.5-3 Monitor platelets by anticoagulation protocol: Yes   Plan:  1. Warfarin 12.5 mg x 1 dose at 1800 today 2. Continue Lovenox 135 mg SQ every 12 hours 3. Will continue to monitor for any signs/symptoms of bleeding and will follow up with PT/INR in the a.m.   Alycia Rossetti, PharmD, BCPS Clinical  Pharmacist Pager: [phone] 04/20/2013 12:42 PM

## 2013-04-20 NOTE — Progress Notes (Signed)
PULMONARY / CRITICAL CARE MEDICINE  Name: Nicholas [name] MRN: [id number] DOB: 1945-02-22    ADMISSION DATE:  04/17/2013 CONSULTATION DATE:  04/17/2013  REFERRING MD :  EDP PRIMARY SERVICE: PCCM  CHIEF COMPLAINT:  Hypotension, SOB  BRIEF PATIENT DESCRIPTION: 68 yo discharged 3/20 on Coumdin after hospitalization for PE.  Reports being compliant. Went to Coumadin clinic 3/26 and became lightheaded, diaphoretic, hypotensive.  Brought back to ED.  PCCM asked to admit.  SIGNIFICANT EVENTS / STUDIES:  3/20 - discharged after hospitalization for PE.  Workup during hospitalization showed + saddle PE, + DVT in right leg. 3/23 - went to Coumadin clinic, INR supratherapeutic, told to hold dose next day, went back today and become symptomatic. 3/26 - readmitted for presyncope.  3/27 Repeat Echo: LVEF 45-50%. When compared to 04/08/13 - RV size and function appears improved. 3/27 LE venous Dopplers: Right: Subacute DVT noted in the popliteal vein and tibial peroneal trunk. There is no propagation of thrombus since study of 04-08-13. No evidence of superficial thrombosis. Left: No evidence of DVT, superficial thrombosis, or Baker's cyst.   SUBJ: No new issues overnight.  Has been trying to walk in halls.  No increased wob or further syncopal spells.  He wants to go home.  BP's ok off meds.   OBJDanley Danker Vitals:   04/19/13 7902 04/19/13 1328 04/19/13 2033 04/20/13 0555  BP: 126/78 159/92 145/81 122/74  Pulse: 65 70 67 98  Temp: 97.3 F (36.3 C) 98.3 F (36.8 C) 98.4 F (36.9 C) 98.1 F (36.7 C)  TempSrc: Oral Oral Oral Oral  Resp: 20 18 18 18   Height:      Weight:      SpO2: 96% 96% 95% 94%    Gen:  Wd male in nad Nose without purulence or d/c noted. Neck without LN or TMG Chest clear to auscultation except minimal basilar crackles.  Cor with rrr LE with mild edema, no cyanosis Alert and oriented, moves all 4.     IMPRESSION: 1) Presyncope- no further episodes  2) Recent PE/DVT -  Dopplers and Echo results do not support recurrence. Suspect presyncope symptoms are attributable to resumption of his anti-hypertensives in setting of recent PE with persistent moderate RV strain. Venous dopplers have been repeated per pt, but I cannot find report in chart although they are ordered.  PLAN: -Holding anti-hypertensives, and will need to decide about meds at d/c.  Careful use of ACE given recent ARF -continue anticoagulation -ambulate. -?home in am.

## 2013-04-21 DIAGNOSIS — Z5181 Encounter for therapeutic drug level monitoring: Secondary | ICD-10-CM

## 2013-04-21 LAB — CBC
HCT: 41.3 % (ref 39.0–52.0)
Hemoglobin: 14.5 g/dL (ref 13.0–17.0)
MCH: 31.8 pg (ref 26.0–34.0)
MCHC: 35.1 g/dL (ref 30.0–36.0)
MCV: 90.6 fL (ref 78.0–100.0)
PLATELETS: 167 10*3/uL (ref 150–400)
RBC: 4.56 MIL/uL (ref 4.22–5.81)
RDW: 13.5 % (ref 11.5–15.5)
WBC: 5.5 10*3/uL (ref 4.0–10.5)

## 2013-04-21 LAB — PROTIME-INR
INR: 2.44 — ABNORMAL HIGH (ref 0.00–1.49)
PROTHROMBIN TIME: 25.7 s — AB (ref 11.6–15.2)

## 2013-04-21 MED ORDER — WARFARIN SODIUM 5 MG PO TABS
ORAL_TABLET | ORAL | Status: DC
Start: 2013-04-21 — End: 2013-08-14

## 2013-04-21 NOTE — Discharge Summary (Signed)
Physician Discharge Summary  Patient ID: Nicholas [name] MRN: [id number] DOB/AGE: January 16, 1946 68 y.o.  Admit date: 04/17/2013 Discharge date: 04/21/2013    Discharge Diagnoses:  Pulmonary Embolism DVT Presyncope Hypotension         AKI                                                 DISCHARGE PLAN BY DIAGNOSIS     Pulmonary Embolism DVT Discharge Plan: - Continue anticoagulation with Coumadin.  Take 7.75m daily until seen at [facility] Clinic on 04/23/13. - Follow up appointment with [facility]madin Clinic on 04/23/13 at 9:00AM. - Follow up appointment with [facility] on 04/23/13 at 9:30AM.  Presyncope Hypotension Discharge Plan: - Discontinue Azor. - Please go to PCP office for blood work on Wednesday 04/23/13 either between 8:30 - 11:00 AM or 1:00 - 4:00 PM. - Follow up with PCP on 04/28/13 at 3:30 PM for blood pressure medication review/changes. (Careful use of ACE along with NSAIDs given recent AKI)  AKI Discharge Plan: - Discontinue Azor and Mobic. - Follow up BMP on 04/23/13, then follow up with PCP 04/28/13/                DISCHARGE SUMMARY   CFerdie Bakkenis a 68y.o. y/o male with a recent admission for PE (discharged 3/20).  Pt was discharged home on Coumadin with follow up appointments at Coumadin clinic.  On 3/23, prior to going to Coumadin clinic, pt became diaphoretic, SOB, and had some chest pain.  Symptoms resolved on their own so pt went in for his scheduled appt.  Whilt at his appt, symptoms recurred.  Staff at clinic noted pt to be hypotensive (60/40) with SpO2 of 89%.  [facility] During hospitalization, pt was treated with IV heparin with Coumadin bridge.   Echo was repeated and revealed improved RV size/function compared to prior study on previous admission.  Venous dopplers were also repeated and did not reveal any propagation of thrombus since prior study on 04/18/13. His presyncope symptoms were thought to be attributable to  resumption of his anti-hypertensives in the setting of recent PE with persistent moderate RV strain. Pt was deemed stable enough for discharge home on 04/21/13.  He is to continue Coumadin therapy and has a follow up appointment scheduled at Coumadin clinic on Wednesday 04/23/13.  He also has follow up appointments with  Pulmonary and his PCP as noted below.           SIGNIFICANT DIAGNOSTIC STUDIES 3/20 - discharged after hospitalization for PE. Workup during hospitalization showed + saddle PE, + DVT in right leg.  3/23 - went to Coumadin clinic, INR supratherapeutic, told to hold dose next day, went back today and become symptomatic.  3/26 - readmitted for presyncope.  3/27 Repeat Echo: LVEF 45-50%. When compared to 04/08/13 - RV size and function appears improved.  3/27 LE venous Dopplers: Right: Subacute DVT noted in the popliteal vein and tibial peroneal trunk. There is no propagation of thrombus since study of 04-08-13. No evidence of superficial thrombosis. Left: No evidence of DVT, superficial thrombosis, or Baker's cyst.  MICRO DATA  None  ANTIBIOTICS None  CONSULTS None  TUBES / LINES PIV   Discharge Exam: General: Pleasant male, resting in bed, in NAD. Neuro: A&O x 3, non-focal.  HEENT: [place]/AT. PERRL, sclerae anicteric. Cardiovascular: RRR, no M/R/G.  Lungs: Respirations even and unlabored.  CTA bilaterally, No W/R/R. Abdomen: BS x 4, soft, NT/ND.  Musculoskeletal: No gross deformities, no edema.  Skin: Intact, warm, no rashes.    Filed Vitals:   04/20/13 1419 04/20/13 2135 04/21/13 0453 04/21/13 0900  BP: 127/75 131/64 116/75 121/77  Pulse: 72 67 54 71  Temp: 98.4 F (36.9 C) 98.4 F (36.9 C) 98 F (36.7 C) 97.9 F (36.6 C)  TempSrc: Oral Oral Oral Oral  Resp: _0 Height:      Weight:      SpO2: 95% 93% 93% 94%     Discharge Labs  BMET  Recent Labs Lab 04/17/13 1200 04/17/13 1416 04/18/13 0240  NA 138 139 138  K 5.2 4.8 4.2  CL 105  107 105  CO2 19  --  20  GLUCOSE 113* 102* 104*  BUN 44* 42* 34*  CREATININE 2.06* 2.10* 1.17  CALCIUM 9.0  --  8.6  MG  --   --  2.2  PHOS  --   --  3.2    CBC  Recent Labs Lab 04/19/13 0558 04/20/13 0530 04/21/13 0716  HGB 13.2 13.5 14.5  HCT 37.3* 37.6* 41.3  WBC 4.6 5.3 5.5  PLT 157 166 167    Anti-Coagulation  Recent Labs Lab 04/17/13 1013 04/17/13 1200 04/19/13 0558 04/20/13 0530 04/21/13 0716  INR 2.1 1.84* 2.04* 2.10* 2.44*    Discharge Orders   Future Appointments Provider Department Dept Phone   04/23/2013 9:00 AM [facility] [place] [phone]   04/23/2013 9:30 AM Melvenia Needles, NP [facility] [phone]   Future Orders Complete By Expires   Call MD for:  difficulty breathing, headache or visual disturbances  As directed    Call MD for:  extreme fatigue  As directed    Call MD for:  persistant dizziness or light-headedness  As directed    Diet - low sodium heart healthy  As directed    Increase activity slowly  As directed            Follow-up Information   Follow up with Golden Pop, MD On 04/28/2013. (At 3:30PM)    Specialty:  Family Medicine   Contact information:   [place] [address] [phone]          Medication List    STOP taking these medications       AZOR 10-40 MG per tablet  Generic drug:  amLODipine-olmesartan     meloxicam 15 MG tablet  Commonly known as:  MOBIC      TAKE these medications       aspirin 81 MG tablet  Take 81 mg by mouth daily.     FISH OIL + D3 PO  Take 2 tablets by mouth daily.     gabapentin 300 MG capsule  Commonly known as:  NEURONTIN  Take 300-600 mg by mouth See admin instructions. Take 600 mg in the morning and 300 mg in the evening     HYDROcodone-acetaminophen 5-325 MG per tablet  Commonly known as:  NORCO/VICODIN  Take 1 tablet by mouth 2 (two) times daily as needed.     hyoscyamine 0.125 MG Tbdp  disintergrating tablet  Commonly known as:  ANASPAZ  Take 0.125 mg by mouth 4 (four) times daily as needed.     LORazepam 1 MG tablet  Commonly known as:  ATIVAN  Take 1 mg by mouth at bedtime.     multivitamin tablet  Take 1 tablet by mouth daily.     omeprazole 40 MG capsule  Commonly known as:  PRILOSEC  Take 40 mg by mouth daily.     SALINE MIST SPRAY NA  Place 2 sprays into both nostrils at bedtime.     SEROQUEL XR 150 MG 24 hr tablet  Generic drug:  QUEtiapine Fumarate  Take 150 mg by mouth at bedtime.     simvastatin 20 MG tablet  Commonly known as:  ZOCOR  Take 20 mg by mouth daily.     Venlafaxine HCl 225 MG Tb24  Take 225 mg by mouth daily.     VESICARE 10 MG tablet  Generic drug:  solifenacin  Take 10 mg by mouth daily.     warfarin 5 MG tablet  Commonly known as:  COUMADIN  Take 7.45m daily until seen at [facility]mad[facility] Clinic on 04/23/13.          Disposition:  Home.  Discharged Condition: Nicholas Olafsonhas met maximum benefit of inpatient care and is medically stable and cleared for discharge.  Patient is pending follow up as above.      Time spent on disposition:  Greater than 35 minutes.   RMontey Hora PHendersonPulmonary & Critical Care Pgr: [phone] or [phone] MJillyn HiddenPCCM Pager: [phone]: [phone] no response, call [phone]

## 2013-04-21 NOTE — Progress Notes (Signed)
Melford Aase discharged Home with wife per MD order.  Discharge instructions reviewed and discussed with the patient, all questions and concerns answered. Copy of instructions, care notes for DVT & PE given to patient.    Medication List    STOP taking these medications       AZOR 10-40 MG per tablet  Generic drug:  amLODipine-olmesartan     meloxicam 15 MG tablet  Commonly known as:  MOBIC      TAKE these medications       aspirin 81 MG tablet  Take 81 mg by mouth daily.     FISH OIL + D3 PO  Take 2 tablets by mouth daily.     gabapentin 300 MG capsule  Commonly known as:  NEURONTIN  Take 300-600 mg by mouth See admin instructions. Take 600 mg in the morning and 300 mg in the evening     HYDROcodone-acetaminophen 5-325 MG per tablet  Commonly known as:  NORCO/VICODIN  Take 1 tablet by mouth 2 (two) times daily as needed.     hyoscyamine 0.125 MG Tbdp disintergrating tablet  Commonly known as:  ANASPAZ  Take 0.125 mg by mouth 4 (four) times daily as needed.     LORazepam 1 MG tablet  Commonly known as:  ATIVAN  Take 1 mg by mouth at bedtime.     multivitamin tablet  Take 1 tablet by mouth daily.     omeprazole 40 MG capsule  Commonly known as:  PRILOSEC  Take 40 mg by mouth daily.     SALINE MIST SPRAY NA  Place 2 sprays into both nostrils at bedtime.     SEROQUEL XR 150 MG 24 hr tablet  Generic drug:  QUEtiapine Fumarate  Take 150 mg by mouth at bedtime.     simvastatin 20 MG tablet  Commonly known as:  ZOCOR  Take 20 mg by mouth daily.     Venlafaxine HCl 225 MG Tb24  Take 225 mg by mouth daily.     VESICARE 10 MG tablet  Generic drug:  solifenacin  Take 10 mg by mouth daily.     warfarin 5 MG tablet  Commonly known as:  COUMADIN  Take 7.5mg  daily until seen at [facility]madin Clinic on 04/23/13.        Patients skin is clean, dry and intact, no evidence of skin break down. IV site discontinued and catheter remains intact. Site without signs and symptoms  of complications. Dressing and pressure applied.  Patient escorted to car by volunteer in a wheelchair,  no distress noted upon discharge.  Wynetta Emery, Aleyda Gindlesperger C 04/21/2013 12:44 PM

## 2013-04-21 NOTE — Care Management Note (Signed)
    Page 1 of 1   04/21/2013     11:04:23 AM   CARE MANAGEMENT NOTE 04/21/2013  Patient:  Nicholas [name], Nicholas [name]   Account Number:  [id number]  Date Initiated:  04/18/2013  Documentation initiated by:  [facility]  Subjective/Objective Assessment:   REadmission with SOB - bilateral PE's.     Action/Plan:   Anticipated DC Date:  04/21/2013   Anticipated DC Plan:  [place]  CM consult      Choice offered to / List presented to:             Status of service:  Completed, signed off Medicare Important Message given?   (If response is "NO", the following Medicare IM given date fields will be blank) Date Medicare IM given:   Date Additional Medicare IM given:    Discharge Disposition:  HOME/SELF CARE  Per UR Regulation:  Reviewed for med. necessity/level of care/duration of stay  If discussed at [place] of Stay Meetings, dates discussed:    Comments:  Contact:  Hanssen,Laurel Other [phone]                 [facility] Relative [phone]                Ernest, Popowski Sister [phone]  04/21/13 [place], BSN [phone] patient lives with spouse, pta indep.  Patient for dc today, no NCM referral, no needs anticipated.

## 2013-04-23 ENCOUNTER — Ambulatory Visit (INDEPENDENT_AMBULATORY_CARE_PROVIDER_SITE_OTHER): Payer: BC Managed Care – PPO | Admitting: General Practice

## 2013-04-23 ENCOUNTER — Other Ambulatory Visit: Payer: BC Managed Care – PPO

## 2013-04-23 ENCOUNTER — Inpatient Hospital Stay: Payer: BC Managed Care – PPO | Admitting: Adult Health

## 2013-04-23 ENCOUNTER — Ambulatory Visit (INDEPENDENT_AMBULATORY_CARE_PROVIDER_SITE_OTHER): Payer: BC Managed Care – PPO | Admitting: Internal Medicine

## 2013-04-23 ENCOUNTER — Encounter: Payer: Self-pay | Admitting: Internal Medicine

## 2013-04-23 VITALS — BP 120/84 | HR 70 | Temp 97.9°F | Ht 71.0 in | Wt 296.4 lb

## 2013-04-23 DIAGNOSIS — I2699 Other pulmonary embolism without acute cor pulmonale: Secondary | ICD-10-CM

## 2013-04-23 DIAGNOSIS — I82409 Acute embolism and thrombosis of unspecified deep veins of unspecified lower extremity: Secondary | ICD-10-CM

## 2013-04-23 DIAGNOSIS — R7989 Other specified abnormal findings of blood chemistry: Secondary | ICD-10-CM | POA: Diagnosis not present

## 2013-04-23 NOTE — Progress Notes (Signed)
Subjective:     Patient ID: Nicholas [name], male   DOB: 01-11-1946  MRN: [id number]  HPI  40 yowm never smoker admitted [facility] with PE/ DVT   Admit date: 04/17/2013  Discharge date: 04/21/2013  Discharge Diagnoses:  Pulmonary Embolism  DVT  Presyncope  Hypotension  AKI  DISCHARGE PLAN BY DIAGNOSIS  Pulmonary Embolism  DVT  Discharge Plan:  - Continue anticoagulation with Coumadin. Take 7.5mg  daily until seen at [facility]in Clinic on 04/23/13.  - Follow up appointment with Coumadin Clinic on 04/23/13 at 9:00AM.  - Follow up appointment with [facility] on 04/23/13 at 9:30AM.  Presyncope  Hypotension  Discharge Plan:  - Discontinue Azor.  - Please go to PCP office for blood work on Wednesday 04/23/13 either between 8:30 - 11:00 AM or 1:00 - 4:00 PM.  - Follow up with PCP on 04/28/13 at 3:30 PM for blood pressure medication review/changes. (Careful use of ACE along with NSAIDs given recent AKI)  AKI  Discharge Plan:  - Discontinue Azor and Mobic.  - Follow up BMP on 04/23/13, then follow up with PCP 04/28/13/  DISCHARGE SUMMARY  Nicholas [name] is a 68 y.o. y/o male with a recent admission for PE (discharged 3/20). Pt was discharged home on Coumadin with follow up appointments at Coumadin clinic. On 3/23, prior to going to Coumadin clinic, pt became diaphoretic, SOB, and had some chest pain. Symptoms resolved on their own so pt went in for his scheduled appt. Whilt at his appt, symptoms recurred. Staff at clinic noted pt to be hypotensive (60/40) with SpO2 of 89%. He was brought back to [facility] ED for questionable worsening PE.  During hospitalization, pt was treated with IV heparin with Coumadin bridge.  Echo was repeated and revealed improved RV size/function compared to prior study on previous admission. Venous dopplers were also repeated and did not reveal any propagation of thrombus since prior study on 04/18/13.  His presyncope symptoms were thought to be attributable to resumption of his anti-hypertensives  in the setting of recent PE with persistent moderate RV strain.  Pt was deemed stable enough for discharge home on 04/21/13. He is to continue Coumadin therapy and has a follow up appointment scheduled at Coumadin clinic on Wednesday 04/23/13. He also has follow up appointments with [facility] and his PCP as noted below.  SIGNIFICANT DIAGNOSTIC STUDIES  3/20 - discharged after hospitalization for PE. Workup during hospitalization showed + saddle PE, + DVT in right leg.  3/23 - went to Coumadin clinic, INR supratherapeutic, told to hold dose next day, went back today and become symptomatic.  3/26 - readmitted for presyncope.  3/27 Repeat Echo: LVEF 45-50%. When compared to 04/08/13 - RV size and function appears improved.  3/27 LE venous Dopplers: Right: Subacute DVT noted in the popliteal vein and tibial peroneal trunk. There is no propagation of thrombus since study of 04-08-13. No evidence of superficial thrombosis. Left: No evidence of DVT, superficial thrombosis, or Baker's cyst.     04/23/2013 1st [facility] office visit/ Rhonna Holster  Chief Complaint  Patient presents with  . Follow-up    HFU- D/C from [facility] 3/30 for PE. Pt states his breathing has improved. Denies SOB, cough, and CP. Pt states he has a golf ball size knot on posterior/lateral right leg, only painful when pushing on it.   no assoc leg swelling. No pleuritic cp/ no h/o leg trauma. No pain in thigh unless pushes on the knot  No obvious day to day or  daytime variabilty or assoc chronic cough or cp or chest tightness, subjective wheeze overt sinus or hb symptoms. No unusual exp hx or h/o childhood pna/ asthma or knowledge of premature birth.  Sleeping ok without nocturnal  or early am exacerbation  of respiratory  c/o's or need for noct saba. Also denies any obvious fluctuation of symptoms with weather or environmental changes or other aggravating or alleviating factors except as outlined above   Current Medications, Allergies,  Complete Past Medical History, Past Surgical History, Family History, and Social History were reviewed in [employer] record.  ROS  The following are not active complaints unless bolded sore throat, dysphagia, dental problems, itching, sneezing,  nasal congestion or excess/ purulent secretions, ear ache,   fever, chills, sweats, unintended wt loss, pleuritic or exertional cp, hemoptysis,  orthopnea pnd or leg swelling, presyncope, palpitations, heartburn, abdominal pain, anorexia, nausea, vomiting, diarrhea  or change in bowel or urinary habits, change in stools or urine, dysuria,hematuria,  rash, arthralgias, visual complaints, headache, numbness weakness or ataxia or problems with walking or coordination,  change in mood/affect or memory.       Review of Systems     Objective:   Physical Exam  Wt Readings from Last 3 Encounters:  04/23/13 296 lb 6.4 oz (134.446 kg)  04/18/13 300 lb 8 oz (136.306 kg)  04/11/13 296 lb 1.2 oz (134.3 kg)     Anxious amb obese wm nad  HEENT: nl dentition, turbinates, and orophanx. Nl external ear canals without cough reflex   NECK :  without JVD/Nodes/TM/ nl carotid upstrokes bilaterally   LUNGS: no acc muscle use, clear to A and P bilaterally without cough on insp or exp maneuvers   CV:  RRR  no s3 or murmur or increase in P2, no edema   ABD:  soft and nontender with nl excursion in the supine position. No bruits or organomegaly, bowel sounds nl  MS:  warm without deformities, calf tenderness, cyanosis or clubbing   SKIN: warm and dry without lesions  X ? Lipoma R distal thigh in the 4 oclock (Post lateral) position s discoloration or calor or head.  NEURO:  alert, approp, no deficits   .    Assessment:             Outpatient Encounter Prescriptions as of 04/23/2013  Medication Sig  . aspirin 81 MG tablet Take 81 mg by mouth daily.  . Fish Oil-Cholecalciferol (FISH OIL + D3 PO) Take 2 tablets by mouth daily.  Marland Kitchen  gabapentin (NEURONTIN) 300 MG capsule Take 300-600 mg by mouth See admin instructions. Take 600 mg in the morning and 300 mg in the evening  . HYDROcodone-acetaminophen (NORCO/VICODIN) 5-325 MG per tablet Take 1 tablet by mouth 2 (two) times daily as needed.  . hyoscyamine (ANASPAZ) 0.125 MG TBDP disintergrating tablet Take 0.125 mg by mouth 4 (four) times daily as needed.   Marland Kitchen LORazepam (ATIVAN) 1 MG tablet Take 1 mg by mouth at bedtime.  . Multiple Vitamin (MULTIVITAMIN) tablet Take 1 tablet by mouth daily.  Marland Kitchen omeprazole (PRILOSEC) 40 MG capsule Take 40 mg by mouth daily.   Marland Kitchen oxymetazoline (AFRIN) 0.05 % nasal spray Place 2 sprays into both nostrils at bedtime as needed for congestion.  . SEROQUEL XR 150 MG 24 hr tablet Take 150 mg by mouth at bedtime.   . simvastatin (ZOCOR) 20 MG tablet Take 20 mg by mouth daily.   . Venlafaxine HCl 225 MG TB24 Take 225  mg by mouth daily.   . VESICARE 10 MG tablet Take 10 mg by mouth daily.   Marland Kitchen warfarin (COUMADIN) 5 MG tablet Take 7.5mg  daily until seen at Coumadin Clinic on 04/23/13.  Marland Kitchen SALINE MIST SPRAY NA Place 2 sprays into both nostrils at bedtime.

## 2013-04-23 NOTE — Patient Instructions (Addendum)
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

## 2013-04-24 ENCOUNTER — Inpatient Hospital Stay: Payer: BC Managed Care – PPO | Admitting: Internal Medicine

## 2013-04-26 NOTE — Assessment & Plan Note (Addendum)
Complicated by RV strain, improving serially by clinical picture, no change in rx needed   F/u by Dr Luvenia Heller fine with me.   See instructions for specific recommendations at 6 months which were reviewed directly with the patient who was given a copy with highlighter outlining the key components.

## 2013-04-26 NOTE — Assessment & Plan Note (Signed)
On approp rx, no evidence of a new clot.  The lesion he's poking on and making tender appears to be a benign lipoma > rec serial observation/ derm eval prn

## 2013-04-29 ENCOUNTER — Inpatient Hospital Stay: Payer: BC Managed Care – PPO | Admitting: Adult Health

## 2013-07-08 DIAGNOSIS — M1710 Unilateral primary osteoarthritis, unspecified knee: Secondary | ICD-10-CM | POA: Diagnosis not present

## 2013-08-14 ENCOUNTER — Encounter: Payer: Self-pay | Admitting: Internal Medicine

## 2013-08-14 ENCOUNTER — Ambulatory Visit (INDEPENDENT_AMBULATORY_CARE_PROVIDER_SITE_OTHER): Payer: BC Managed Care – PPO | Admitting: Internal Medicine

## 2013-08-14 VITALS — BP 118/68 | HR 83 | Temp 97.9°F | Ht 71.0 in | Wt 282.2 lb

## 2013-08-14 DIAGNOSIS — I2699 Other pulmonary embolism without acute cor pulmonale: Secondary | ICD-10-CM

## 2013-08-14 NOTE — Progress Notes (Signed)
Subjective:     Patient ID: Nicholas [name], male   DOB: December 24, 1945  MRN: [id number]     68 yowm never smoker admitted [facility] with PE/ DVT   Admit date: 04/17/2013  Discharge date: 04/21/2013  Discharge Diagnoses:  Pulmonary Embolism  DVT  Presyncope  Hypotension  AKI  DISCHARGE PLAN BY DIAGNOSIS  Pulmonary Embolism  DVT  Discharge Plan:  - Continue anticoagulation with Coumadin. Take 7.5mg  daily until seen at [facility] on 04/23/13.  - Follow up appointment with [facility] Clinic on 04/23/13 at 9:00AM.  - Follow up appointment with [facility] on 04/23/13 at 9:30AM.  Presyncope  Hypotension  Discharge Plan:  - Discontinue Azor.  - Please go to PCP office for blood work on Wednesday 04/23/13 either between 8:30 - 11:00 AM or 1:00 - 4:00 PM.  - Follow up with PCP on 04/28/13 at 3:30 PM for blood pressure medication review/changes. (Careful use of ACE along with NSAIDs given recent AKI)  AKI  Discharge Plan:  - Discontinue Azor and Mobic.  - Follow up BMP on 04/23/13, then follow up with PCP 04/28/13/  DISCHARGE SUMMARY  Nicholas [name] is a 68 y.o. y/o male with a recent admission for PE (discharged 3/20). Pt was discharged home on Coumadin with follow up appointments at Coumadin clinic. On 3/23, prior to going to Coumadin clinic, pt became diaphoretic, SOB, and had some chest pain. Symptoms resolved on their own so pt went in for his scheduled appt. Whilt at his appt, symptoms recurred. Staff at clinic noted pt to be hypotensive (60/40) with SpO2 of 89%. He was brought back to [facility] ED for questionable worsening PE.  During hospitalization, pt was treated with IV heparin with Coumadin bridge.  Echo was repeated and revealed improved RV size/function compared to prior study on previous admission. Venous dopplers were also repeated and did not reveal any propagation of thrombus since prior study on 04/18/13.  His presyncope symptoms were thought to be attributable to resumption of his anti-hypertensives in  the setting of recent PE with persistent moderate RV strain.  Pt was deemed stable enough for discharge home on 04/21/13. He is to continue Coumadin therapy and has a follow up appointment scheduled at Coumadin clinic on Wednesday 04/23/13. He also has follow up appointments with [facility] and his PCP as noted below.  SIGNIFICANT DIAGNOSTIC STUDIES  3/20 - discharged after hospitalization for PE. Workup during hospitalization showed + saddle PE, + DVT in right leg.  3/23 - went to Coumadin clinic, INR supratherapeutic, told to hold dose next day, went back today and become symptomatic.  3/26 - readmitted for presyncope.  3/27 Repeat Echo: LVEF 45-50%. When compared to 04/08/13 - RV size and function appears improved.  3/27 LE venous Dopplers: Right: Subacute DVT noted in the popliteal vein and tibial peroneal trunk. There is no propagation of thrombus since study of 04-08-13. No evidence of superficial thrombosis. Left: No evidence of DVT, superficial thrombosis, or Baker's cyst.      04/23/2013 1st [facility] office visit/ Wert  Chief Complaint  Patient presents with  . Follow-up    HFU- D/C from [facility] 3/30 for PE. Pt states his breathing has improved. Denies SOB, cough, and CP. Pt states he has a golf ball size knot on posterior/lateral right leg, only painful when pushing on it.   no assoc leg swelling. No pleuritic cp/ no h/o leg trauma. 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 Follow-up    Pre op clearance for left knee replacement (Dr [name]) - Denies sob, cough , wheeze or chest tightness ( CHanged from Coumadin to Eliquis 2 days ago   Not limited by breathing from desired activities  But by knees, esp Left   No obvious day to day or daytime variabilty or assoc chronic cough or cp or chest tightness, subjective wheeze overt sinus or hb symptoms. No unusual exp hx or h/o childhood pna/ asthma or knowledge of premature birth.  Sleeping ok without nocturnal  or early am exacerbation  of respiratory  c/o's or need for noct saba. Also denies any obvious fluctuation of symptoms with weather or environmental changes or other aggravating or alleviating factors except as outlined above   Current Medications, Allergies, Complete Past Medical History, Past Surgical History, Family History, and Social History were reviewed in [employer] record.  ROS  The following are not active complaints unless bolded sore throat, dysphagia, dental problems, itching, sneezing,  nasal congestion or excess/ purulent secretions, ear ache,   fever, chills, sweats, unintended wt loss, pleuritic or exertional cp, hemoptysis,  orthopnea pnd or leg swelling, presyncope, palpitations, heartburn, abdominal pain, anorexia, nausea, vomiting, diarrhea  or change in bowel or urinary habits, change in stools or urine, dysuria,hematuria,  rash, arthralgias, visual complaints, headache, numbness weakness or ataxia or problems with walking or coordination,  change in mood/affect or memory.             Objective:   Physical Exam  08/14/2013        282  Wt Readings from Last 3 Encounters:  04/23/13 296 lb 6.4 oz (134.446 kg)  04/18/13 300 lb 8 oz (136.306 kg)  04/11/13 296 lb 1.2 oz (134.3 kg)     Pleasant  amb obese wm nad obvious pain in both knees and can barely stand or get up on exam table s assistance, use of cane  HEENT: nl dentition, turbinates, and orophanx. Nl external ear canals without cough reflex   NECK :  without  JVD/Nodes/TM/ nl carotid upstrokes bilaterally   LUNGS: no acc muscle use, clear to A and P bilaterally without cough on insp or exp maneuvers   CV:  RRR  no s3 or murmur or increase in P2, no edema   ABD:  soft and nontender with nl excursion in the supine position. No bruits or organomegaly, bowel sounds nl  MS:  warm without deformities, calf tenderness, cyanosis or clubbing - mild bilateral LE vericose veins s pitting edema   SKIN: warm and dry without lesions  X ? Lipoma R distal thigh in the 4 oclock (Post lateral) position s discoloration or calor or head.  NEURO:  alert, approp, no deficits   .    Assessment:

## 2013-08-14 NOTE — Assessment & Plan Note (Addendum)
Dx 04/17/13 with R DVT and RV strain - Repeat Echo ordered    - Repeat bilateral venous dopplers ordered    Discussed in detail all the  indications, usual  risks and alternatives  relative to the benefits with patient who agrees to proceed with repeat studies to establish nl RV and no dvt then acceptable risk for surgery given how much pain he's in though there will be risk of clotting again post op due to previous clots and obesity so would need to bridge immediately to a reversible anticoagulant like heparin or lovenox immediately post op/ when hemostasis achieved and wear PAS intraop.  If studies have not normalized needs to put off surgery until 6 m on rx as originally planned

## 2013-08-14 NOTE — Patient Instructions (Addendum)
Please see patient coordinator before you leave today  to schedule repeat echo and dopplers of both legs on same day   If these are both normalized then the risk of clotting off the blood thinner is low but not zero and you will need bridged back to eliquis as soon as possible post op with either heparin or lovenox   Dr [name] in [place]

## 2013-08-26 ENCOUNTER — Encounter: Payer: Self-pay | Admitting: Internal Medicine

## 2013-08-26 ENCOUNTER — Ambulatory Visit (HOSPITAL_BASED_OUTPATIENT_CLINIC_OR_DEPARTMENT_OTHER): Payer: BC Managed Care – PPO

## 2013-08-26 ENCOUNTER — Ambulatory Visit (HOSPITAL_COMMUNITY): Payer: BC Managed Care – PPO | Attending: Internal Medicine | Admitting: Cardiology

## 2013-08-26 DIAGNOSIS — R609 Edema, unspecified: Secondary | ICD-10-CM

## 2013-08-26 DIAGNOSIS — I87009 Postthrombotic syndrome without complications of unspecified extremity: Secondary | ICD-10-CM

## 2013-08-26 DIAGNOSIS — I2699 Other pulmonary embolism without acute cor pulmonale: Secondary | ICD-10-CM | POA: Diagnosis not present

## 2013-08-26 NOTE — Progress Notes (Signed)
Bilateral lower venous duplex performed  

## 2013-08-26 NOTE — Progress Notes (Signed)
2D Echo completed. 08/26/2013

## 2013-08-27 NOTE — Progress Notes (Signed)
Quick Note:  Spoke with pt and notified of results per Dr. Wert. Pt verbalized understanding and denied any questions.  ______ 

## 2013-09-01 DIAGNOSIS — M1710 Unilateral primary osteoarthritis, unspecified knee: Secondary | ICD-10-CM | POA: Diagnosis not present

## 2013-10-27 ENCOUNTER — Telehealth: Payer: Self-pay | Admitting: Internal Medicine

## 2013-10-27 NOTE — Telephone Encounter (Signed)
See OV note 08/14/13. Called and LMTCB x1 for [employer]

## 2013-10-28 DIAGNOSIS — I82401 Acute embolism and thrombosis of unspecified deep veins of right lower extremity: Secondary | ICD-10-CM | POA: Diagnosis not present

## 2013-10-28 DIAGNOSIS — I10 Essential (primary) hypertension: Secondary | ICD-10-CM | POA: Diagnosis not present

## 2013-10-28 DIAGNOSIS — R739 Hyperglycemia, unspecified: Secondary | ICD-10-CM | POA: Diagnosis not present

## 2013-10-28 DIAGNOSIS — R7309 Other abnormal glucose: Secondary | ICD-10-CM | POA: Diagnosis not present

## 2013-10-28 DIAGNOSIS — N182 Chronic kidney disease, stage 2 (mild): Secondary | ICD-10-CM | POA: Diagnosis not present

## 2013-10-28 DIAGNOSIS — I2699 Other pulmonary embolism without acute cor pulmonale: Secondary | ICD-10-CM | POA: Diagnosis not present

## 2013-10-28 NOTE — Telephone Encounter (Signed)
Surgical clearance received and placed in MW's lookat Dr Melvyn Novas please advise, thank you.

## 2013-10-28 NOTE — Telephone Encounter (Signed)
lmtcb for [employer]

## 2013-10-28 NOTE — Telephone Encounter (Addendum)
Nicholas [name] returned call Spoke with Nicholas [name] and discussed MW's recommendations to hold off on surgery x32months from the 7.23.15 ov.  However, that appt was for surgical clearance for a hip replacement - Nicholas [name] is calling from Urology office.  She is faxing the clearance to triage.  Will await fax.

## 2013-10-29 NOTE — Telephone Encounter (Signed)
Magda Paganini, has this been faxed. Thanks.

## 2013-10-29 NOTE — Telephone Encounter (Signed)
Already done

## 2013-10-29 NOTE — Telephone Encounter (Signed)
I faxed it this am and placed in MW scan folder

## 2013-11-03 ENCOUNTER — Telehealth: Payer: Self-pay | Admitting: Internal Medicine

## 2013-11-03 NOTE — Telephone Encounter (Signed)
[employer]. I was advised the clearance was not received. Re-faxed clearance and last OV note to [phone]. Clearance placed back in MW's scan folder. Nothing further needed.

## 2013-11-04 ENCOUNTER — Telehealth: Payer: Self-pay | Admitting: Internal Medicine

## 2013-11-04 NOTE — Telephone Encounter (Signed)
Dr Melvyn Novas, looks like you just wanted him to him to have ECHO done before the next ov and this was already done  Is there anything further needed or just ov? Please advise, thanks!

## 2013-11-04 NOTE — Telephone Encounter (Signed)
Just ov then

## 2013-11-04 NOTE — Telephone Encounter (Signed)
lmomtcb x1 

## 2013-11-05 NOTE — Telephone Encounter (Signed)
Spoke with pt's wife and advised per MW that pt does not need scans at this time.  Only f/u ov.  Verbalized understanding and ov scheduled.

## 2013-11-12 DIAGNOSIS — M79641 Pain in right hand: Secondary | ICD-10-CM | POA: Diagnosis not present

## 2013-11-13 ENCOUNTER — Ambulatory Visit (INDEPENDENT_AMBULATORY_CARE_PROVIDER_SITE_OTHER): Payer: BC Managed Care – PPO | Admitting: Internal Medicine

## 2013-11-13 ENCOUNTER — Encounter: Payer: Self-pay | Admitting: Internal Medicine

## 2013-11-13 VITALS — BP 142/84 | HR 72 | Ht 71.0 in | Wt 287.0 lb

## 2013-11-13 DIAGNOSIS — I2699 Other pulmonary embolism without acute cor pulmonale: Secondary | ICD-10-CM

## 2013-11-13 NOTE — Patient Instructions (Addendum)
Please see patient coordinator before you leave today  to schedule repeat echo to see if your right heart function has improved and if not you may need a temporary filter placed prior to your surgery

## 2013-11-13 NOTE — Assessment & Plan Note (Signed)
Dx 04/17/13 with R DVT and RV strain - Repeat Echo 08/26/2013 - Normal LV size with EF 45-50%. Basal to mid inferior hypokinesis. Normal RV size with mildly decreased systolic function.  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  He his high risk if has recurrent pe and if RV has not improved will probably need periop filter esp if needs TURP and L TKR  Discussed in detail all the  indications, usual  risks and alternatives  relative to the benefits with patient who agrees to proceed with echo first and go from there

## 2013-11-13 NOTE — Progress Notes (Signed)
Subjective:     Patient ID: Nicholas [name], male   DOB: September 08, 1945  MRN: [id number]     89 yowm never smoker admitted [facility] with PE/ DVT   Admit date: 04/17/2013  Discharge date: 04/21/2013  Discharge Diagnoses:  Pulmonary Embolism  DVT  Presyncope  Hypotension  AKI  DISCHARGE PLAN BY DIAGNOSIS  Pulmonary Embolism  DVT  Discharge Plan:  - Continue anticoagulation with Coumadin. Take 7.5mg  daily until seen at [facility] on 04/23/13.  - Follow up appointment with [facility] Clinic on 04/23/13 at 9:00AM.  - Follow up appointment with [facility] on 04/23/13 at 9:30AM.  Presyncope  Hypotension  Discharge Plan:  - Discontinue Azor.  - Please go to PCP office for blood work on Wednesday 04/23/13 either between 8:30 - 11:00 AM or 1:00 - 4:00 PM.  - Follow up with PCP on 04/28/13 at 3:30 PM for blood pressure medication review/changes. (Careful use of ACE along with NSAIDs given recent AKI)  AKI  Discharge Plan:  - Discontinue Azor and Mobic.  - Follow up BMP on 04/23/13, then follow up with PCP 04/28/13/  DISCHARGE SUMMARY  Nicholas [name] is a 68 y.o. y/o male with a recent admission for PE (discharged 3/20). Pt was discharged home on Coumadin with follow up appointments at Coumadin clinic. On 3/23, prior to going to Coumadin clinic, pt became diaphoretic, SOB, and had some chest pain. Symptoms resolved on their own so pt went in for his scheduled appt. Whilt at his appt, symptoms recurred. Staff at clinic noted pt to be hypotensive (60/40) with SpO2 of 89%. He was brought back to [facility] ED for questionable worsening PE.  During hospitalization, pt was treated with IV heparin with Coumadin bridge.  Echo was repeated and revealed improved RV size/function compared to prior study on previous admission. Venous dopplers were also repeated and did not reveal any propagation of thrombus since prior study on 04/18/13.  His presyncope symptoms were thought to be attributable to resumption of his anti-hypertensives in  the setting of recent PE with persistent moderate RV strain.  Pt was deemed stable enough for discharge home on 04/21/13. He is to continue Coumadin therapy and has a follow up appointment scheduled at Coumadin clinic on Wednesday 04/23/13. He also has follow up appointments with [facility] and his PCP as noted below.  SIGNIFICANT DIAGNOSTIC STUDIES  3/20 - discharged after hospitalization for PE. Workup during hospitalization showed + saddle PE, + DVT in right leg.  3/23 - went to Coumadin clinic, INR supratherapeutic, told to hold dose next day, went back today and become symptomatic.  3/26 - readmitted for presyncope.  3/27 Repeat Echo: LVEF 45-50%. When compared to 04/08/13 - RV size and function appears improved.  3/27 LE venous Dopplers: Right: Subacute DVT noted in the popliteal vein and tibial peroneal trunk. There is no propagation of thrombus since study of 04-08-13. No evidence of superficial thrombosis. Left: No evidence of DVT, superficial thrombosis, or Baker's cyst.      04/23/2013 1st [facility] office visit/ Nicholas [name]  Chief Complaint  Patient presents with  . Follow-up    HFU- D/C from [facility] 3/30 for PE. Pt states his breathing has improved. Denies SOB, cough, and CP. Pt states he has a golf ball size knot on posterior/lateral right leg, only painful when pushing on it.   no assoc leg swelling. No pleuritic cp/ no h/o leg trauma. 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 Follow-up    Pre op clearance for left knee replacement (Dr [name]) - Denies sob, cough , wheeze or chest tightness ( CHanged from Coumadin to Eliquis 2 days ago   Not limited by breathing from desired activities  But by knees, esp Left  rec Please see patient coordinator before you leave today  to schedule repeat echo and dopplers of both legs on same day  If these are both normalized then the risk of clotting off the blood thinner is low but not zero and you will need bridged back to eliquis as soon as possible post op with either heparin or lovenox    11/13/2013  reconsult ov/Bentleigh Waren re: ? Clear for surgery L TKR and TURP Chief Complaint  Patient presents with  . Follow-up    Pt needs sx clearance for L knee replacement.  Pt has no breathing complaints at this time.      Not sob but very sedentary due to knee pain, also ? Needs TURP, both to be done at [facility]  No obvious day to day or daytime variabilty or assoc chronic cough or cp or chest tightness, subjective wheeze overt sinus or hb symptoms. No unusual exp hx or h/o childhood pna/ asthma or knowledge of premature birth.  Sleeping ok without nocturnal  or early am exacerbation  of respiratory  c/o's or need for noct saba. Also denies any obvious fluctuation of symptoms with weather or environmental changes or other aggravating or alleviating factors except as outlined above   Current Medications, Allergies, Complete Past Medical History, Past Surgical History, Family History, and Social History were reviewed in [employer] record.  ROS  The following are not active complaints unless bolded sore throat, dysphagia, dental problems, itching, sneezing,  nasal congestion or excess/ purulent secretions, ear ache,   fever, chills, sweats, unintended wt loss, pleuritic or exertional cp, hemoptysis,  orthopnea pnd or leg swelling, presyncope, palpitations, heartburn, abdominal pain, anorexia, nausea, vomiting, diarrhea  or change in bowel or urinary habits, change in stools or urine,  dysuria,hematuria,  rash, arthralgias, visual complaints, headache, numbness weakness or ataxia or problems with walking or coordination,  change in mood/affect or memory.             Objective:   Physical Exam  08/14/2013        282 > 11/13/2013 287  Wt Readings from Last 3 Encounters:  04/23/13 296 lb 6.4 oz (134.446 kg)  04/18/13 300 lb 8 oz (136.306 kg)  04/11/13 296 lb 1.2 oz (134.3 kg)     Pleasant  amb obese wm nad obvious pain in both knees and but still can get up on exam table s assistance or cane.  HEENT: nl dentition, turbinates, and orophanx. Nl external ear canals without cough reflex   NECK :  without JVD/Nodes/TM/ nl carotid upstrokes bilaterally   LUNGS: no acc muscle use, clear to A and P bilaterally without cough on insp or exp maneuvers   CV:  RRR  no s3  or murmur - nl P2, no edema   ABD:  soft and nontender with nl excursion in the supine position. No bruits or organomegaly, bowel sounds nl  MS:  warm without deformities, calf tenderness, cyanosis or clubbing - mild bilateral LE vericose veins with  pitting edema L > R and crepitance both knees    SKIN: warm and dry without lesions  X ? Lipoma R distal thigh in the 4 oclock (Post lateral) position s discoloration or calor or head.      .    Assessment:

## 2013-11-18 ENCOUNTER — Ambulatory Visit (HOSPITAL_COMMUNITY): Payer: BC Managed Care – PPO | Attending: Internal Medicine | Admitting: Radiology

## 2013-11-18 ENCOUNTER — Other Ambulatory Visit (HOSPITAL_COMMUNITY): Payer: Self-pay | Admitting: Internal Medicine

## 2013-11-18 ENCOUNTER — Telehealth: Payer: Self-pay | Admitting: Internal Medicine

## 2013-11-18 ENCOUNTER — Encounter: Payer: Self-pay | Admitting: Internal Medicine

## 2013-11-18 DIAGNOSIS — I10 Essential (primary) hypertension: Secondary | ICD-10-CM | POA: Diagnosis not present

## 2013-11-18 DIAGNOSIS — R0602 Shortness of breath: Secondary | ICD-10-CM | POA: Diagnosis not present

## 2013-11-18 DIAGNOSIS — I2699 Other pulmonary embolism without acute cor pulmonale: Secondary | ICD-10-CM

## 2013-11-18 NOTE — Progress Notes (Signed)
Quick Note:  LMTCB ______ 

## 2013-11-18 NOTE — Progress Notes (Signed)
Quick Note:  Spoke with pt and notified of results per Dr. Wert. Pt verbalized understanding and denied any questions.  ______ 

## 2013-11-18 NOTE — Progress Notes (Signed)
Echocardiogram performed.  

## 2013-11-18 NOTE — Telephone Encounter (Signed)
    Call patient : Study is c/w recovery of his RV so ok to hold eliquis for 36 hours prior to surgery and restart this or lovenox asap post op (send copy to referring surgeon)   Spoke with pt and notified of results per Dr. Melvyn Novas. Pt verbalized understanding and denied any questions. Copy faxed to Dr [name]

## 2013-11-20 ENCOUNTER — Telehealth: Payer: Self-pay | Admitting: Internal Medicine

## 2013-11-20 ENCOUNTER — Ambulatory Visit: Payer: Self-pay | Admitting: Urology

## 2013-11-20 DIAGNOSIS — I10 Essential (primary) hypertension: Secondary | ICD-10-CM | POA: Diagnosis not present

## 2013-11-20 LAB — URINALYSIS, COMPLETE
BILIRUBIN, UR: NEGATIVE
BLOOD: NEGATIVE
Glucose,UR: NEGATIVE mg/dL (ref 0–75)
KETONE: NEGATIVE
NITRITE: NEGATIVE
PROTEIN: NEGATIVE
Ph: 5 (ref 4.5–8.0)
Specific Gravity: 1.03 (ref 1.003–1.030)
Squamous Epithelial: 3

## 2013-11-20 LAB — CBC WITH DIFFERENTIAL/PLATELET
BASOS PCT: 0.9 %
Basophil #: 0.1 10*3/uL (ref 0.0–0.1)
EOS PCT: 3.2 %
Eosinophil #: 0.2 10*3/uL (ref 0.0–0.7)
HCT: 48.1 % (ref 40.0–52.0)
HGB: 16.1 g/dL (ref 13.0–18.0)
Lymphocyte #: 1.2 10*3/uL (ref 1.0–3.6)
Lymphocyte %: 15.3 %
MCH: 31.4 pg (ref 26.0–34.0)
MCHC: 33.4 g/dL (ref 32.0–36.0)
MCV: 94 fL (ref 80–100)
MONO ABS: 0.7 x10 3/mm (ref 0.2–1.0)
MONOS PCT: 8.7 %
NEUTROS PCT: 71.9 %
Neutrophil #: 5.4 10*3/uL (ref 1.4–6.5)
Platelet: 182 10*3/uL (ref 150–440)
RBC: 5.13 10*6/uL (ref 4.40–5.90)
RDW: 14.4 % (ref 11.5–14.5)
WBC: 7.5 10*3/uL (ref 3.8–10.6)

## 2013-11-20 NOTE — Telephone Encounter (Signed)
Spoke with Nicholas [name]  She states that we faxed over the July ov note  She needs more recent note  I have refaxed oct ov note what ECHO and last phone note Nothing further needed

## 2013-11-21 ENCOUNTER — Telehealth: Payer: Self-pay | Admitting: Internal Medicine

## 2013-11-21 NOTE — Telephone Encounter (Signed)
Per 10/27 echo results: Notes Recorded by Tanda Rockers, MD on 11/18/2013 at 12:59 PM Call patient : Study is c/w recovery of his RV so ok to hold eliquis for 36 hours prior to surgery and restart this or lovenox asap post op (send copy to referring surgeon --  ATC line fast busy signal x 3 wcb

## 2013-11-23 LAB — URINE CULTURE

## 2013-11-24 NOTE — Telephone Encounter (Signed)
Called and spoke pt's wife. Informed pt's wife of the recs per MW. Pt's wife verbalized understanding and denied any further questions or concerns at this time.

## 2013-11-24 NOTE — Telephone Encounter (Signed)
Pt is calling back & can be reached at [phone].  Nicholas [name]

## 2013-11-25 DIAGNOSIS — M170 Bilateral primary osteoarthritis of knee: Secondary | ICD-10-CM | POA: Diagnosis not present

## 2013-11-26 ENCOUNTER — Ambulatory Visit: Payer: Self-pay | Admitting: Urology

## 2013-11-26 DIAGNOSIS — N359 Urethral stricture, unspecified: Secondary | ICD-10-CM | POA: Diagnosis not present

## 2013-12-09 ENCOUNTER — Ambulatory Visit (INDEPENDENT_AMBULATORY_CARE_PROVIDER_SITE_OTHER): Payer: BC Managed Care – PPO | Admitting: Internal Medicine

## 2013-12-09 ENCOUNTER — Encounter: Payer: Self-pay | Admitting: Internal Medicine

## 2013-12-09 VITALS — BP 138/76 | HR 66 | Ht 71.0 in | Wt 285.0 lb

## 2013-12-09 DIAGNOSIS — I441 Atrioventricular block, second degree: Secondary | ICD-10-CM | POA: Diagnosis not present

## 2013-12-09 DIAGNOSIS — I440 Atrioventricular block, first degree: Secondary | ICD-10-CM

## 2013-12-09 DIAGNOSIS — Z86711 Personal history of pulmonary embolism: Secondary | ICD-10-CM | POA: Diagnosis not present

## 2013-12-09 DIAGNOSIS — R9431 Abnormal electrocardiogram [ECG] [EKG]: Secondary | ICD-10-CM

## 2013-12-09 NOTE — Patient Instructions (Signed)
Your physician recommends that you continue on your current medications as directed. Please refer to the Current Medication list given to you today.  We will see you back on an as needed basis. 

## 2013-12-09 NOTE — Progress Notes (Signed)
ELECTROPHYSIOLOGY CONSULT NOTE  Patient ID: Nicholas [name]., MRN: [id number], DOB/AGE: 08/03/1945 68 y.o. Admit date: (Not on file) Date of Consult: 12/09/2013  Primary Physician: Golden Pop, MD Primary Cardiologist: new  Chief Complaint:  Electrocardiogram-Abnormal   HPI Nicholas [name]. is a 68 y.o. male  Referred because of an abnormal echocardiogram noted during a urological procedure last week.  He has no known cardiac history. He did have a pulmonary embolism March 2015 at which time echocardiogram demonstrated normal LV function and RV dilatation. He has been treated with anticoagulation since that time and repeat echocardiogram demonstrated recovery of RV function. He also has a history of obstructive sleep apnea for which he has disinclined to pursue therapy and thus disinclined to undergo sleep study  He has no known heart disease.He denies chest pain. He does have mild dyspnea on exertion which is been stable for many many years. He has trace peripheral edema.  He has a little bit of orthostatic lightheadedness but has had no syncope.        Past Medical History  Diagnosis Date  . Hypertension   . High cholesterol   . Pulmonary embolism 04/07/2013  . DVT (deep venous thrombosis) 04/07/2013    "one in knee; one in calf"   . Arthritis     "knees, hips, hands" (04/09/2013)  . Bulging lumbar disc   . Anxiety   . Depression       Surgical History:  Past Surgical History  Procedure Laterality Date  . Total hip arthroplasty Right 2004  . Total hip arthroplasty Left 2006  . Joint replacement    . Knee cartilage surgery Left 1965    "football injury"  . Knee arthroscopy Right 2013  . Cataract extraction w/ intraocular lens  implant, bilateral Bilateral ~ 2010  . Pilonidal cyst excision  1970's  . Cardiac catheterization  ~ 2005    "once"     Home Meds: Prior to Admission medications   Medication Sig Start Date End Date Taking? Authorizing Provider    apixaban (ELIQUIS) 5 MG TABS tablet Take 5 mg by mouth 2 (two) times daily.   Yes Historical Provider, MD  aspirin 81 MG tablet Take 81 mg by mouth daily.   Yes Historical Provider, MD  Fish Oil-Cholecalciferol (FISH OIL + D3 PO) Take 2 tablets by mouth daily.   Yes Historical Provider, MD  gabapentin (NEURONTIN) 300 MG capsule Take 300-600 mg by mouth See admin instructions. Take 600 mg in the morning and 300 mg in the evening 03/10/13  Yes Historical Provider, MD  HYDROcodone-acetaminophen (NORCO/VICODIN) 5-325 MG per tablet Take 1 tablet by mouth 2 (two) times daily as needed. 04/04/13  Yes Historical Provider, MD  LORazepam (ATIVAN) 1 MG tablet Take 1 mg by mouth at bedtime.   Yes Historical Provider, MD  Multiple Vitamin (MULTIVITAMIN) tablet Take 1 tablet by mouth daily.   Yes Historical Provider, MD  oxymetazoline (AFRIN) 0.05 % nasal spray Place 2 sprays into both nostrils at bedtime as needed for congestion.   Yes Historical Provider, MD  SALINE MIST SPRAY NA Place 2 sprays into both nostrils at bedtime.   Yes Historical Provider, MD  SEROQUEL XR 150 MG 24 hr tablet Take 150 mg by mouth at bedtime.  01/28/13  Yes Historical Provider, MD  simvastatin (ZOCOR) 20 MG tablet Take 20 mg by mouth daily.  03/10/13  Yes Historical Provider, MD  Venlafaxine HCl 225 MG TB24 Take 225 mg by mouth  daily.  02/22/13  Yes Historical Provider, MD  VESICARE 10 MG tablet Take 10 mg by mouth daily.  02/24/13  Yes Historical Provider, MD       Allergies: No Known Allergies  History   Social History  . Marital Status: Married    Spouse Name: N/A    Number of Children: N/A  . Years of Education: N/A   Occupational History  . Not on file.   Social History Main Topics  . Smoking status: Never Smoker   . Smokeless tobacco: Never Used  . Alcohol Use: 3.6 oz/week    6 Cans of beer per week  . Drug Use: No  . Sexual Activity: Not Currently   Other Topics Concern  . Not on file   Social History  Narrative     Family History  Problem Relation Age of Onset  . Family history unknown: Yes     ROS:  Please see the history of present illness.     All other systems reviewed and negative.    Physical Exam:   Blood pressure 138/76, pulse 66, height 5\' 11"  (1.803 m), weight 285 lb (129.275 kg). General: Well developed, well nourished male in no acute distress. Head: Normocephalic, atraumatic, sclera non-icteric, no xanthomas, nares are without discharge. EENT: normal Lymph Nodes:  none Back: without scoliosis/kyphosis , no CVA tendersness Neck: Negative for carotid bruits. JVD 7-8 cm Lungs: Clear bilaterally to auscultation without wheezes, rales, or rhonchi. Breathing is unlabored. Heart: RRR with S1 S2. No  murmur , rubs, or gallops appreciated. Abdomen: Soft, non-tender, non-distended with normoactive bowel sounds. No hepatomegaly. No rebound/guarding. No obvious abdominal masses. Msk:  Strength and tone appear normal for age. Extremities: No clubbing or cyanosis.  Trace edema.  Distal pedal pulses are 2+ and equal bilaterally. Skin: Warm and Dry Neuro: Alert and oriented X 3. CN III-XII intact Grossly normal sensory and motor function . Psych:  Responds to questions appropriately with a normal affect.      Labs: Cardiac Enzymes No results for input(s): CKTOTAL, CKMB, TROPONINI in the last 72 hours. CBC Lab Results  Component Value Date   WBC 5.5 04/21/2013   HGB 14.5 04/21/2013   HCT 41.3 04/21/2013   MCV 90.6 04/21/2013   PLT 167 04/21/2013   PROTIME: No results for input(s): LABPROT, INR in the last 72 hours. Chemistry No results for input(s): NA, K, CL, CO2, BUN, CREATININE, CALCIUM, PROT, BILITOT, ALKPHOS, ALT, AST, GLUCOSE in the last 168 hours.  Invalid input(s): LABALBU Lipids No results found for: CHOL, HDL, LDLCALC, TRIG BNP PRO B NATRIURETIC PEPTIDE (BNP)  Date/Time Value Ref Range Status  04/07/2013 08:24 PM 1217.0* 0 - 125 pg/mL Final    Miscellaneous No results found for: DDIMER  Radiology/Studies:  No results found.  EKG:  Sinus rhythm at 66 Intervals-/11/41 with evidence of long cycle point Wenckebach  ECGs were reviewed from 4 November and 29 October of this year the former demonstrated sinus rhythm with first-degree AV block the latter also demonstrated long cycle Wenckebach  Heart tracing from 3/15 was reviewed demonstrating sinus rhythm with first-degree AV block   Assessment and Plan:  Second-degree type I Mobitz heart block  First-degree AV block  Pulmonary embolism with resolved RV dysfunction  The patient has asymptomatic second-degree type I heart block. His first-degree AV block while variably prolonged is unassociated with symptoms.  Prior cardiac evaluation included an echocardiogram that was normal. He has no symptoms of exercise limitation or chest discomfort  to suggest the need for evaluation for coronary artery disease  At this point there is no further cardiac workup is necessary.  We will see him as needed  I suggested that he discontinue his aspirin and not take it in conjunction with his apixaban     Virl Axe

## 2013-12-16 ENCOUNTER — Ambulatory Visit: Payer: Self-pay | Admitting: Specialist

## 2013-12-16 LAB — CBC
HCT: 44.8 % (ref 40.0–52.0)
HGB: 14.7 g/dL (ref 13.0–18.0)
MCH: 31 pg (ref 26.0–34.0)
MCHC: 32.8 g/dL (ref 32.0–36.0)
MCV: 94 fL (ref 80–100)
PLATELETS: 157 10*3/uL (ref 150–440)
RBC: 4.75 10*6/uL (ref 4.40–5.90)
RDW: 13.7 % (ref 11.5–14.5)
WBC: 6.2 10*3/uL (ref 3.8–10.6)

## 2013-12-16 LAB — BASIC METABOLIC PANEL
ANION GAP: 5 — AB (ref 7–16)
BUN: 22 mg/dL — ABNORMAL HIGH (ref 7–18)
CHLORIDE: 107 mmol/L (ref 98–107)
Calcium, Total: 9.1 mg/dL (ref 8.5–10.1)
Co2: 29 mmol/L (ref 21–32)
Creatinine: 1.2 mg/dL (ref 0.60–1.30)
EGFR (Non-African Amer.): >60
Glucose: 109 mg/dL — ABNORMAL HIGH (ref 65–99)
Osmolality: 285 (ref 275–301)
POTASSIUM: 4 mmol/L (ref 3.5–5.1)
Sodium: 141 mmol/L (ref 136–145)

## 2013-12-16 LAB — URINALYSIS, COMPLETE
BILIRUBIN, UR: NEGATIVE
Blood: NEGATIVE
Glucose,UR: NEGATIVE mg/dL (ref 0–75)
Hyaline Cast: 24
Ketone: NEGATIVE
NITRITE: NEGATIVE
PH: 5 (ref 4.5–8.0)
Protein: NEGATIVE
RBC,UR: 3 /HPF (ref 0–5)
Specific Gravity: 1.024 (ref 1.003–1.030)
WBC UR: 15 /HPF (ref 0–5)

## 2013-12-16 LAB — PROTIME-INR
INR: 1.1
PROTHROMBIN TIME: 14.4 s (ref 11.5–14.7)

## 2013-12-16 LAB — MRSA PCR SCREENING

## 2013-12-22 ENCOUNTER — Inpatient Hospital Stay: Payer: Self-pay | Admitting: Specialist

## 2013-12-22 DIAGNOSIS — F419 Anxiety disorder, unspecified: Secondary | ICD-10-CM | POA: Diagnosis present

## 2013-12-22 DIAGNOSIS — F329 Major depressive disorder, single episode, unspecified: Secondary | ICD-10-CM | POA: Diagnosis present

## 2013-12-22 DIAGNOSIS — I4892 Unspecified atrial flutter: Secondary | ICD-10-CM | POA: Diagnosis not present

## 2013-12-22 DIAGNOSIS — M25569 Pain in unspecified knee: Secondary | ICD-10-CM | POA: Diagnosis present

## 2013-12-22 DIAGNOSIS — I480 Paroxysmal atrial fibrillation: Secondary | ICD-10-CM | POA: Diagnosis not present

## 2013-12-22 DIAGNOSIS — I97191 Other postprocedural cardiac functional disturbances following other surgery: Secondary | ICD-10-CM | POA: Diagnosis not present

## 2013-12-22 DIAGNOSIS — N179 Acute kidney failure, unspecified: Secondary | ICD-10-CM | POA: Diagnosis not present

## 2013-12-22 DIAGNOSIS — E0590 Thyrotoxicosis, unspecified without thyrotoxic crisis or storm: Secondary | ICD-10-CM | POA: Diagnosis present

## 2013-12-22 DIAGNOSIS — Z6839 Body mass index (BMI) 39.0-39.9, adult: Secondary | ICD-10-CM | POA: Diagnosis not present

## 2013-12-22 DIAGNOSIS — Z96643 Presence of artificial hip joint, bilateral: Secondary | ICD-10-CM | POA: Diagnosis present

## 2013-12-22 DIAGNOSIS — Z86718 Personal history of other venous thrombosis and embolism: Secondary | ICD-10-CM | POA: Diagnosis not present

## 2013-12-22 DIAGNOSIS — Z86711 Personal history of pulmonary embolism: Secondary | ICD-10-CM | POA: Diagnosis not present

## 2013-12-22 DIAGNOSIS — I440 Atrioventricular block, first degree: Secondary | ICD-10-CM | POA: Diagnosis present

## 2013-12-22 DIAGNOSIS — I10 Essential (primary) hypertension: Secondary | ICD-10-CM | POA: Diagnosis present

## 2013-12-22 DIAGNOSIS — N400 Enlarged prostate without lower urinary tract symptoms: Secondary | ICD-10-CM | POA: Diagnosis present

## 2013-12-22 DIAGNOSIS — E780 Pure hypercholesterolemia: Secondary | ICD-10-CM | POA: Diagnosis present

## 2013-12-22 DIAGNOSIS — M1712 Unilateral primary osteoarthritis, left knee: Secondary | ICD-10-CM | POA: Diagnosis not present

## 2013-12-22 DIAGNOSIS — M179 Osteoarthritis of knee, unspecified: Secondary | ICD-10-CM | POA: Diagnosis present

## 2013-12-22 LAB — CREATININE, SERUM
Creatinine: 0.95 mg/dL (ref 0.60–1.30)
EGFR (Non-African Amer.): >60

## 2013-12-23 ENCOUNTER — Other Ambulatory Visit: Payer: Self-pay | Admitting: Nurse Practitioner

## 2013-12-23 ENCOUNTER — Encounter: Payer: Self-pay | Admitting: Nurse Practitioner

## 2013-12-23 DIAGNOSIS — I480 Paroxysmal atrial fibrillation: Secondary | ICD-10-CM

## 2013-12-23 DIAGNOSIS — I10 Essential (primary) hypertension: Secondary | ICD-10-CM

## 2013-12-23 DIAGNOSIS — E785 Hyperlipidemia, unspecified: Secondary | ICD-10-CM

## 2013-12-23 LAB — BASIC METABOLIC PANEL
Anion Gap: 7 (ref 7–16)
BUN: 18 mg/dL (ref 7–18)
CHLORIDE: 105 mmol/L (ref 98–107)
Calcium, Total: 8.4 mg/dL — ABNORMAL LOW (ref 8.5–10.1)
Co2: 26 mmol/L (ref 21–32)
Creatinine: 1.06 mg/dL (ref 0.60–1.30)
Glucose: 116 mg/dL — ABNORMAL HIGH (ref 65–99)
OSMOLALITY: 279 (ref 275–301)
Potassium: 4.7 mmol/L (ref 3.5–5.1)
Sodium: 138 mmol/L (ref 136–145)

## 2013-12-23 LAB — CBC WITH DIFFERENTIAL/PLATELET
Basophil #: 0 10*3/uL (ref 0.0–0.1)
Basophil %: 0.5 %
EOS ABS: 0.1 10*3/uL (ref 0.0–0.7)
Eosinophil %: 1.6 %
HCT: 39.5 % — AB (ref 40.0–52.0)
HGB: 13 g/dL (ref 13.0–18.0)
LYMPHS ABS: 0.7 10*3/uL — AB (ref 1.0–3.6)
Lymphocyte %: 7.8 %
MCH: 31.4 pg (ref 26.0–34.0)
MCHC: 32.9 g/dL (ref 32.0–36.0)
MCV: 95 fL (ref 80–100)
MONO ABS: 0.7 x10 3/mm (ref 0.2–1.0)
Monocyte %: 8.1 %
NEUTROS ABS: 7.1 10*3/uL — AB (ref 1.4–6.5)
Neutrophil %: 82 %
Platelet: 123 10*3/uL — ABNORMAL LOW (ref 150–440)
RBC: 4.14 10*6/uL — ABNORMAL LOW (ref 4.40–5.90)
RDW: 13.9 % (ref 11.5–14.5)
WBC: 8.7 10*3/uL (ref 3.8–10.6)

## 2013-12-23 LAB — TSH

## 2013-12-24 LAB — HEMOGLOBIN: HGB: 13.1 g/dL (ref 13.0–18.0)

## 2014-01-20 ENCOUNTER — Ambulatory Visit: Payer: Self-pay | Admitting: Family Medicine

## 2014-01-26 DIAGNOSIS — R2689 Other abnormalities of gait and mobility: Secondary | ICD-10-CM | POA: Diagnosis not present

## 2014-01-26 DIAGNOSIS — M25662 Stiffness of left knee, not elsewhere classified: Secondary | ICD-10-CM | POA: Diagnosis not present

## 2014-01-26 DIAGNOSIS — M25562 Pain in left knee: Secondary | ICD-10-CM | POA: Diagnosis not present

## 2014-01-27 DIAGNOSIS — N359 Urethral stricture, unspecified: Secondary | ICD-10-CM | POA: Diagnosis not present

## 2014-01-27 DIAGNOSIS — R310 Gross hematuria: Secondary | ICD-10-CM | POA: Diagnosis not present

## 2014-01-27 DIAGNOSIS — N480 Leukoplakia of penis: Secondary | ICD-10-CM | POA: Diagnosis not present

## 2014-01-28 DIAGNOSIS — R300 Dysuria: Secondary | ICD-10-CM | POA: Diagnosis not present

## 2014-01-28 DIAGNOSIS — M25662 Stiffness of left knee, not elsewhere classified: Secondary | ICD-10-CM | POA: Diagnosis not present

## 2014-01-28 DIAGNOSIS — R2689 Other abnormalities of gait and mobility: Secondary | ICD-10-CM | POA: Diagnosis not present

## 2014-01-28 DIAGNOSIS — M25562 Pain in left knee: Secondary | ICD-10-CM | POA: Diagnosis not present

## 2014-02-02 DIAGNOSIS — M25562 Pain in left knee: Secondary | ICD-10-CM | POA: Diagnosis not present

## 2014-02-02 DIAGNOSIS — R2689 Other abnormalities of gait and mobility: Secondary | ICD-10-CM | POA: Diagnosis not present

## 2014-02-02 DIAGNOSIS — M25662 Stiffness of left knee, not elsewhere classified: Secondary | ICD-10-CM | POA: Diagnosis not present

## 2014-02-03 ENCOUNTER — Telehealth: Payer: Self-pay | Admitting: Internal Medicine

## 2014-02-03 NOTE — Telephone Encounter (Signed)
Would need ov to clear but not likely another scan so schedule ov no ct for now

## 2014-02-03 NOTE — Telephone Encounter (Signed)
Pt states that he is going to see his orthopedic surgeon tomorrow to discuss having right knee replacement.  Pt recently had left knee replaced.  Pt wants to know if he should repeat scan to f/u on prior PE before having surgery.  Please advise.

## 2014-02-03 NOTE — Telephone Encounter (Signed)
Spoke with pt and advised of Dr Gustavus Bryant recommendations.  Ov scheduled with Dr Melvyn Novas.

## 2014-02-04 DIAGNOSIS — Z96652 Presence of left artificial knee joint: Secondary | ICD-10-CM | POA: Diagnosis not present

## 2014-02-04 DIAGNOSIS — M1711 Unilateral primary osteoarthritis, right knee: Secondary | ICD-10-CM | POA: Diagnosis not present

## 2014-02-05 ENCOUNTER — Ambulatory Visit: Payer: Self-pay | Admitting: Family Medicine

## 2014-02-05 DIAGNOSIS — R0789 Other chest pain: Secondary | ICD-10-CM | POA: Diagnosis not present

## 2014-02-05 DIAGNOSIS — M778 Other enthesopathies, not elsewhere classified: Secondary | ICD-10-CM | POA: Diagnosis not present

## 2014-02-05 DIAGNOSIS — R609 Edema, unspecified: Secondary | ICD-10-CM | POA: Diagnosis not present

## 2014-02-05 DIAGNOSIS — J986 Disorders of diaphragm: Secondary | ICD-10-CM | POA: Diagnosis not present

## 2014-02-05 DIAGNOSIS — J984 Other disorders of lung: Secondary | ICD-10-CM | POA: Diagnosis not present

## 2014-02-12 ENCOUNTER — Other Ambulatory Visit: Payer: Self-pay | Admitting: Radiology

## 2014-02-12 ENCOUNTER — Encounter: Payer: Self-pay | Admitting: Internal Medicine

## 2014-02-12 ENCOUNTER — Ambulatory Visit (INDEPENDENT_AMBULATORY_CARE_PROVIDER_SITE_OTHER): Payer: BLUE CROSS/BLUE SHIELD | Admitting: Internal Medicine

## 2014-02-12 VITALS — BP 112/62 | HR 91 | Temp 98.1°F | Ht 71.0 in | Wt 274.6 lb

## 2014-02-12 DIAGNOSIS — M7989 Other specified soft tissue disorders: Secondary | ICD-10-CM

## 2014-02-12 DIAGNOSIS — I2699 Other pulmonary embolism without acute cor pulmonale: Secondary | ICD-10-CM

## 2014-02-12 DIAGNOSIS — I82403 Acute embolism and thrombosis of unspecified deep veins of lower extremity, bilateral: Secondary | ICD-10-CM | POA: Diagnosis not present

## 2014-02-12 NOTE — Progress Notes (Signed)
Subjective:     Patient ID: Nicholas [name], male   DOB: 1945-04-18  MRN: [id number]     69 yowm never smoker but morbidly obese admitted [facility] with PE/ DVT   Admit date: 04/17/2013  Discharge date: 04/21/2013  Discharge Diagnoses:  Pulmonary Embolism  DVT  Presyncope  Hypotension  AKI  DISCHARGE PLAN BY DIAGNOSIS  Pulmonary Embolism  DVT  Discharge Plan:  - Continue anticoagulation with Coumadin. Take 7.5mg  daily until seen at [facility] Clinic on 04/23/13.  - Follow up appointment with Coumadin Clinic on 04/23/13 at 9:00AM.  - Follow up appointment with [facility] on 04/23/13 at 9:30AM.  Presyncope  Hypotension  Discharge Plan:  - Discontinue Azor.  - Please go to PCP office for blood work on Wednesday 04/23/13 either between 8:30 - 11:00 AM or 1:00 - 4:00 PM.  - Follow up with PCP on 04/28/13 at 3:30 PM for blood pressure medication review/changes. (Careful use of ACE along with NSAIDs given recent AKI)  AKI  Discharge Plan:  - Discontinue Azor and Mobic.  - Follow up BMP on 04/23/13, then follow up with PCP 04/28/13/  DISCHARGE SUMMARY  Nicholas [name] is a 69 y.o. y/o male with a recent admission for PE (discharged 3/20). Pt was discharged home on Coumadin with follow up appointments at Coumadin clinic. On 3/23, prior to going to Coumadin clinic, pt became diaphoretic, SOB, and had some chest pain. Symptoms resolved on their own so pt went in for his scheduled appt. Whilt at his appt, symptoms recurred. Staff at clinic noted pt to be hypotensive (60/40) with SpO2 of 89%. He was brought back to [facility] ED for questionable worsening PE.  During hospitalization, pt was treated with IV heparin with Coumadin bridge.  Echo was repeated and revealed improved RV size/function compared to prior study on previous admission. Venous dopplers were also repeated and did not reveal any propagation of thrombus since prior study on 04/18/13.  His presyncope symptoms were thought to be attributable to resumption of his  anti-hypertensives in the setting of recent PE with persistent moderate RV strain.  Pt was deemed stable enough for discharge home on 04/21/13. He is to continue Coumadin therapy and has a follow up appointment scheduled at Coumadin clinic on Wednesday 04/23/13. He also has follow up appointments with [facility] and his PCP as noted below.  SIGNIFICANT DIAGNOSTIC STUDIES  3/20 - discharged after hospitalization for PE. Workup during hospitalization showed + saddle PE, + DVT in right leg.  3/23 - went to Coumadin clinic, INR supratherapeutic, told to hold dose next day, went back today and become symptomatic.  3/26 - readmitted for presyncope.  3/27 Repeat Echo: LVEF 45-50%. When compared to 04/08/13 - RV size and function appears improved.  3/27 LE venous Dopplers: Right: Subacute DVT noted in the popliteal vein and tibial peroneal trunk. There is no propagation of thrombus since study of 04-08-13. No evidence of superficial thrombosis. Left: No evidence of DVT, superficial thrombosis, or Baker's cyst.      04/23/2013 1st [facility] office visit/ Logyn Kendrick  Chief Complaint  Patient presents with  . Follow-up    HFU- D/C from [facility] 3/30 for PE. Pt states his breathing has improved. Denies SOB, cough, and CP. Pt states he has a golf ball size knot on posterior/lateral right leg, only painful when pushing on it.   no assoc leg swelling. No pleuritic cp/ no h/o leg trauma. No pain in thigh unless pushes on the knot rec Your  main risk factor for future clots is your weight > defer treatment Chrissman and fine to let him adjust your coumadin also  You have a strained Right heart from the blood clots and the treatment is a minimum of 6 months coumadin then recheck Echo at 09/2013 to be sure it has recovered and the clot is gone (repeat doppler R leg)    08/14/2013  Jamyiah Labella re: pre-op consultation per Dr Sabra Heck  Chief Complaint  Patient presents with  . Follow-up    Pre op clearance for left knee  replacement (Dr [name]) - Denies sob, cough , wheeze or chest tightness ( CHanged from Coumadin to Eliquis 2 days ago   Not limited by breathing from desired activities  But by knees, esp Left  rec Please see patient coordinator before you leave today  to schedule repeat echo and dopplers of both legs on same day  If these are both normalized then the risk of clotting off the blood thinner is low but not zero and you will need bridged back to eliquis as soon as possible post op with either heparin or lovenox    11/13/2013  reconsult ov/Camerin Jimenez re: ? Clear for surgery L TKR and TURP Chief Complaint  Patient presents with  . Follow-up    Pt needs sx clearance for L knee replacement.  Pt has no breathing complaints at this time.     Not sob but very sedentary due to knee pain, also ? Needs TURP, both to be done at [facility] rec Please see patient coordinator before you leave today  to schedule repeat echo to see if your right heart function has improved and if not you may need a temporary filter placed prior to your surgery > echo ok so cleared     02/12/2014 f/u ov/Vian Fluegel re:  Chief Complaint  Patient presents with  . Follow-up    Surgical clearance-Right knee replacement.No sob no cough.Surgery Feb. 3rd.  walks at [employer] > energy level an issue/ cc fatigue  but breathing fine, one month ahead of schedule on the L knee so ready for the R knee surgery now    No obvious day to day or daytime variabilty or assoc chronic cough or cp or chest tightness, subjective wheeze overt sinus or hb symptoms. No unusual exp hx or h/o childhood pna/ asthma or knowledge of premature birth.  Sleeping ok without nocturnal  or early am exacerbation  of respiratory  c/o's or need for noct saba. Also denies any obvious fluctuation of symptoms with weather or environmental changes or other aggravating or alleviating factors except as outlined above   Current Medications, Allergies, Complete Past Medical History,  Past Surgical History, Family History, and Social History were reviewed in [employer] record.  ROS  The following are not active complaints unless bolded sore throat, dysphagia, dental problems, itching, sneezing,  nasal congestion or excess/ purulent secretions, ear ache,   fever, chills, sweats, unintended wt loss, pleuritic or exertional cp, hemoptysis,  orthopnea pnd or leg swelling, presyncope, palpitations, heartburn, abdominal pain, anorexia, nausea, vomiting, diarrhea  or change in bowel or urinary habits, change in stools or urine, dysuria,hematuria,  rash, arthralgias, visual complaints, headache, numbness weakness or ataxia or problems with walking or coordination,  change in mood/affect or memory.             Objective:   Physical Exam  08/14/2013        282 > 11/13/2013 287 >  02/12/2014 275  Wt Readings from Last 3 Encounters:  04/23/13 296 lb 6.4 oz (134.446 kg)  04/18/13 300 lb 8 oz (136.306 kg)  04/11/13 296 lb 1.2 oz (134.3 kg)     Pleasant  amb obese wm nad obvious pain in both knees and but still can get up on exam table s assistance or cane.  HEENT: nl dentition, turbinates, and orophanx. Nl external ear canals without cough reflex   NECK :  without JVD/Nodes/TM/ nl carotid upstrokes bilaterally   LUNGS: no acc muscle use, clear to A and P bilaterally without cough on insp or exp maneuvers   CV:  RRR  no s3 or murmur - nl P2, no edema   ABD:  soft and nontender with nl excursion in the supine position. No bruits or organomegaly, bowel sounds nl  MS:  warm without deformities, calf tenderness, cyanosis or clubbing - mild bilateral LE vericose veins with  pitting edema L > R     SKIN: warm and dry without lesions  X ? Lipoma R distal thigh in the 4 oclock (Post lateral) position s discoloration or calor or head.      .    Assessment:

## 2014-02-12 NOTE — Patient Instructions (Addendum)
Please see patient coordinator before you leave today  to schedule venous dopplers bilaterally   You are cleared for surgery pending the results of the venous dopplers > we will let Nicholas [name] in [place] know > ok to use the same routine you did last time in terms of the periop eliquis management and restart asap post op

## 2014-02-13 ENCOUNTER — Encounter: Payer: Self-pay | Admitting: Internal Medicine

## 2014-02-13 NOTE — Assessment & Plan Note (Signed)
Dx 04/17/13 with R DVT and RV strain - Repeat Echo 08/26/2013 - Normal LV size with EF 45-50%. Basal to mid inferior hypokinesis. Normal RV size with mildly decreased systolic function.  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 - Repeat Echo  11/18/2013 Normal to mildly reduced LV function (EF 50); mild LAE; trace MR and TR. RV ok so 11/18/2013 ok'd for orthopedic surgery hold x 36 h (3 half lives) preoop and restart post op or bridge with lovenox > tolerated fine  I had an extended discussion with the patient reviewing all relevant studies completed to date and  lasting 15 to 20 minutes of a 25 minute visit on the following ongoing concerns:   He is still at some increased risk of dvt periop but his RV reserve is back to nl so should do fine as long as no delay in restarting full anticoagulation post op.   Will clear for surgery p get fresh venous doppler info for baseline  Main challenge long term is wt loss/ reviewed  See instructions for specific recommendations which were reviewed directly with the patient who was given a copy with highlighter outlining the key components.

## 2014-02-13 NOTE — Assessment & Plan Note (Signed)
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   - Rec repeat bilateral venous dopplers pre op as baseline

## 2014-02-16 ENCOUNTER — Encounter (HOSPITAL_COMMUNITY): Payer: BLUE CROSS/BLUE SHIELD

## 2014-02-16 ENCOUNTER — Ambulatory Visit: Payer: Self-pay | Admitting: Specialist

## 2014-02-16 LAB — URINALYSIS, COMPLETE
BACTERIA: NONE SEEN
BILIRUBIN, UR: NEGATIVE
BLOOD: NEGATIVE
GLUCOSE, UR: NEGATIVE mg/dL (ref 0–75)
NITRITE: NEGATIVE
Ph: 5 (ref 4.5–8.0)
Protein: 30
RBC,UR: 1 /HPF (ref 0–5)
SPECIFIC GRAVITY: 1.023 (ref 1.003–1.030)

## 2014-02-16 LAB — BASIC METABOLIC PANEL
Anion Gap: 8 (ref 7–16)
BUN: 16 mg/dL (ref 7–18)
Calcium, Total: 9.6 mg/dL (ref 8.5–10.1)
Chloride: 106 mmol/L (ref 98–107)
Co2: 27 mmol/L (ref 21–32)
Creatinine: 1.13 mg/dL (ref 0.60–1.30)
EGFR (African American): >60
EGFR (Non-African Amer.): >60
Glucose: 104 mg/dL — ABNORMAL HIGH (ref 65–99)
Osmolality: 283 (ref 275–301)
Potassium: 3.8 mmol/L (ref 3.5–5.1)
Sodium: 141 mmol/L (ref 136–145)

## 2014-02-16 LAB — CBC
HCT: 43.7 % (ref 40.0–52.0)
HGB: 14.5 g/dL (ref 13.0–18.0)
MCH: 29.6 pg (ref 26.0–34.0)
MCHC: 33.2 g/dL (ref 32.0–36.0)
MCV: 89 fL (ref 80–100)
Platelet: 234 10*3/uL (ref 150–440)
RBC: 4.9 10*6/uL (ref 4.40–5.90)
RDW: 13.9 % (ref 11.5–14.5)
WBC: 6.8 10*3/uL (ref 3.8–10.6)

## 2014-02-16 LAB — PROTIME-INR
INR: 1.2
Prothrombin Time: 14.9 secs — ABNORMAL HIGH (ref 11.5–14.7)

## 2014-02-16 LAB — MRSA PCR SCREENING

## 2014-02-16 LAB — APTT: Activated PTT: 31.5 secs (ref 23.6–35.9)

## 2014-02-17 DIAGNOSIS — I2699 Other pulmonary embolism without acute cor pulmonale: Secondary | ICD-10-CM | POA: Diagnosis not present

## 2014-02-17 DIAGNOSIS — I441 Atrioventricular block, second degree: Secondary | ICD-10-CM | POA: Diagnosis not present

## 2014-02-18 ENCOUNTER — Ambulatory Visit (HOSPITAL_COMMUNITY): Payer: BLUE CROSS/BLUE SHIELD | Attending: Cardiovascular Disease | Admitting: *Deleted

## 2014-02-18 DIAGNOSIS — M7989 Other specified soft tissue disorders: Secondary | ICD-10-CM | POA: Insufficient documentation

## 2014-02-18 DIAGNOSIS — I82401 Acute embolism and thrombosis of unspecified deep veins of right lower extremity: Secondary | ICD-10-CM | POA: Insufficient documentation

## 2014-02-18 DIAGNOSIS — I82409 Acute embolism and thrombosis of unspecified deep veins of unspecified lower extremity: Secondary | ICD-10-CM

## 2014-02-18 NOTE — Progress Notes (Signed)
Unilateral lower extremity venous duplex exam - right

## 2014-02-20 ENCOUNTER — Telehealth: Payer: Self-pay | Admitting: Internal Medicine

## 2014-02-20 NOTE — Telephone Encounter (Signed)
Notes Recorded by Tanda Rockers, MD on 02/20/2014 at 11:08 AM Call patient : Study is c/w unresolved clot in small veins only so no change in my recs  ------------------------  Patient notified.  No questions or concerns at this time. Nothing further needed.

## 2014-02-20 NOTE — Progress Notes (Signed)
Quick Note:  LMTCB ______ 

## 2014-02-25 ENCOUNTER — Inpatient Hospital Stay: Payer: Self-pay | Admitting: Specialist

## 2014-02-25 DIAGNOSIS — Z96649 Presence of unspecified artificial hip joint: Secondary | ICD-10-CM | POA: Diagnosis present

## 2014-02-25 DIAGNOSIS — M179 Osteoarthritis of knee, unspecified: Secondary | ICD-10-CM | POA: Diagnosis present

## 2014-02-25 DIAGNOSIS — I10 Essential (primary) hypertension: Secondary | ICD-10-CM | POA: Diagnosis present

## 2014-02-25 DIAGNOSIS — Z86718 Personal history of other venous thrombosis and embolism: Secondary | ICD-10-CM | POA: Diagnosis not present

## 2014-02-25 DIAGNOSIS — Z86711 Personal history of pulmonary embolism: Secondary | ICD-10-CM | POA: Diagnosis not present

## 2014-02-25 DIAGNOSIS — Z471 Aftercare following joint replacement surgery: Secondary | ICD-10-CM | POA: Diagnosis not present

## 2014-02-25 DIAGNOSIS — M21061 Valgus deformity, not elsewhere classified, right knee: Secondary | ICD-10-CM | POA: Diagnosis present

## 2014-02-25 DIAGNOSIS — N359 Urethral stricture, unspecified: Secondary | ICD-10-CM | POA: Diagnosis present

## 2014-02-25 DIAGNOSIS — Z96651 Presence of right artificial knee joint: Secondary | ICD-10-CM | POA: Diagnosis not present

## 2014-02-25 DIAGNOSIS — Z96659 Presence of unspecified artificial knee joint: Secondary | ICD-10-CM | POA: Diagnosis present

## 2014-02-25 DIAGNOSIS — M1711 Unilateral primary osteoarthritis, right knee: Secondary | ICD-10-CM | POA: Diagnosis not present

## 2014-02-26 LAB — CBC WITH DIFFERENTIAL/PLATELET
BASOS ABS: 0 10*3/uL (ref 0.0–0.1)
Basophil %: 0.3 %
Eosinophil #: 0.1 10*3/uL (ref 0.0–0.7)
Eosinophil %: 0.5 %
HCT: 32 % — ABNORMAL LOW (ref 40.0–52.0)
HGB: 10.8 g/dL — ABNORMAL LOW (ref 13.0–18.0)
LYMPHS PCT: 8.5 %
Lymphocyte #: 0.8 10*3/uL — ABNORMAL LOW (ref 1.0–3.6)
MCH: 29.8 pg (ref 26.0–34.0)
MCHC: 33.8 g/dL (ref 32.0–36.0)
MCV: 88 fL (ref 80–100)
MONOS PCT: 8.1 %
Monocyte #: 0.8 x10 3/mm (ref 0.2–1.0)
NEUTROS ABS: 7.8 10*3/uL — AB (ref 1.4–6.5)
NEUTROS PCT: 82.6 %
PLATELETS: 130 10*3/uL — AB (ref 150–440)
RBC: 3.64 10*6/uL — ABNORMAL LOW (ref 4.40–5.90)
RDW: 14 % (ref 11.5–14.5)
WBC: 9.4 10*3/uL (ref 3.8–10.6)

## 2014-02-26 LAB — BASIC METABOLIC PANEL
Anion Gap: 6 — ABNORMAL LOW (ref 7–16)
BUN: 20 mg/dL — AB (ref 7–18)
CALCIUM: 8.4 mg/dL — AB (ref 8.5–10.1)
Chloride: 106 mmol/L (ref 98–107)
Co2: 25 mmol/L (ref 21–32)
Creatinine: 0.94 mg/dL (ref 0.60–1.30)
GLUCOSE: 137 mg/dL — AB (ref 65–99)
Osmolality: 279 (ref 275–301)
Potassium: 3.8 mmol/L (ref 3.5–5.1)
SODIUM: 137 mmol/L (ref 136–145)

## 2014-02-27 LAB — HEMOGLOBIN: HGB: 10.6 g/dL — AB (ref 13.0–18.0)

## 2014-03-03 ENCOUNTER — Ambulatory Visit: Payer: Self-pay | Admitting: Specialist

## 2014-03-03 DIAGNOSIS — Z9889 Other specified postprocedural states: Secondary | ICD-10-CM | POA: Diagnosis not present

## 2014-03-03 DIAGNOSIS — I82431 Acute embolism and thrombosis of right popliteal vein: Secondary | ICD-10-CM | POA: Diagnosis not present

## 2014-03-04 DIAGNOSIS — Z96653 Presence of artificial knee joint, bilateral: Secondary | ICD-10-CM | POA: Diagnosis not present

## 2014-03-18 DIAGNOSIS — R2689 Other abnormalities of gait and mobility: Secondary | ICD-10-CM | POA: Diagnosis not present

## 2014-03-18 DIAGNOSIS — M25662 Stiffness of left knee, not elsewhere classified: Secondary | ICD-10-CM | POA: Diagnosis not present

## 2014-03-18 DIAGNOSIS — M25562 Pain in left knee: Secondary | ICD-10-CM | POA: Diagnosis not present

## 2014-03-20 DIAGNOSIS — M25561 Pain in right knee: Secondary | ICD-10-CM | POA: Diagnosis not present

## 2014-03-20 DIAGNOSIS — R2689 Other abnormalities of gait and mobility: Secondary | ICD-10-CM | POA: Diagnosis not present

## 2014-03-20 DIAGNOSIS — M25661 Stiffness of right knee, not elsewhere classified: Secondary | ICD-10-CM | POA: Diagnosis not present

## 2014-03-24 DIAGNOSIS — R2689 Other abnormalities of gait and mobility: Secondary | ICD-10-CM | POA: Diagnosis not present

## 2014-03-24 DIAGNOSIS — M25561 Pain in right knee: Secondary | ICD-10-CM | POA: Diagnosis not present

## 2014-03-24 DIAGNOSIS — M25661 Stiffness of right knee, not elsewhere classified: Secondary | ICD-10-CM | POA: Diagnosis not present

## 2014-03-26 DIAGNOSIS — M1991 Primary osteoarthritis, unspecified site: Secondary | ICD-10-CM | POA: Diagnosis not present

## 2014-03-26 DIAGNOSIS — R2689 Other abnormalities of gait and mobility: Secondary | ICD-10-CM | POA: Diagnosis not present

## 2014-03-26 DIAGNOSIS — M25669 Stiffness of unspecified knee, not elsewhere classified: Secondary | ICD-10-CM | POA: Diagnosis not present

## 2014-03-26 DIAGNOSIS — M25561 Pain in right knee: Secondary | ICD-10-CM | POA: Diagnosis not present

## 2014-03-26 DIAGNOSIS — R600 Localized edema: Secondary | ICD-10-CM | POA: Diagnosis not present

## 2014-03-26 DIAGNOSIS — M25661 Stiffness of right knee, not elsewhere classified: Secondary | ICD-10-CM | POA: Diagnosis not present

## 2014-03-26 DIAGNOSIS — M179 Osteoarthritis of knee, unspecified: Secondary | ICD-10-CM | POA: Diagnosis not present

## 2014-03-26 DIAGNOSIS — R269 Unspecified abnormalities of gait and mobility: Secondary | ICD-10-CM | POA: Diagnosis not present

## 2014-03-31 DIAGNOSIS — R2689 Other abnormalities of gait and mobility: Secondary | ICD-10-CM | POA: Diagnosis not present

## 2014-03-31 DIAGNOSIS — M25661 Stiffness of right knee, not elsewhere classified: Secondary | ICD-10-CM | POA: Diagnosis not present

## 2014-03-31 DIAGNOSIS — M25561 Pain in right knee: Secondary | ICD-10-CM | POA: Diagnosis not present

## 2014-04-08 DIAGNOSIS — M02811 Other reactive arthropathies, right shoulder: Secondary | ICD-10-CM | POA: Diagnosis not present

## 2014-04-08 DIAGNOSIS — Z96653 Presence of artificial knee joint, bilateral: Secondary | ICD-10-CM | POA: Diagnosis not present

## 2014-05-16 NOTE — H&P (Signed)
   Subjective/Chief Complaint Left knee pain   History of Present Illness 69 year old male with severe left knee pain for an extended period of time.  He has failed conservative treatment with NSAIDs, rest, injections and bracing. X-rays show sclerosis and cyst formation.   He has a pulmonary embolism and deep venous thrombosis earlier this year but has been cleared for surgery by his pulmonologist.  He wishes to proceed with total knee replacement understanding the risks and benefits clearly.  He has had a prior total hip replacement and knows the extent of surgery. Risks and benefits of surgery were discussed at length including but not limited to infection, non union, nerve or blood vessed damage, non union, need for repeat surgery, blood clots and lung emboli, and death. Eliquis has been stopped 3 days ago.   Past Medical Health Smoking, pulmonary embolism and deep venous thrombosis last Spring   Code Status Full Code   Past Med/Surgical Hx:  HTN:   Hip Replacement - Right: revision 2007  left total hip replacement:   Vasectomy:   Knee Surgery - Left:   ALLERGIES:  No Known Allergies:   HOME MEDICATIONS: Medication Instructions Status  Norco 325 mg-5 mg oral tablet 1 tab(s) orally 2 times a day, As Needed Active  Ativan 1 mg oral tablet 1 tab(s) orally once a day (at bedtime) Active  Afrin 0.05% nasal spray 2 spray(s) nasal once a day (at bedtime) Active  Saline Mist 0.65% nasal spray 2 spray(s) nasal once a day (at bedtime) Active  SEROquel XR 150 mg oral tablet, extended release 1 tab(s) orally once a day (in the evening) Active  venlafaxine 225 mg oral tablet, extended release 1 tab(s) orally once a day (in the morning)  Active  Eliquis 5 mg oral tablet 1 tab(s) orally 2 times a day Active  Fish Oil - oral capsule 1 cap(s) orally once a day Active  gabapentin 300 mg oral capsule 2 cap(s) orally once a day (in the morning) and one capsule at bedtime Active  multivitamin 1 tab(s)  orally once a day Active  VESIcare 10 mg oral tablet 1 tab(s) orally once a day Active  simvastatin 20 mg oral tablet 1 tab(s) orally once a day (in the morning) Active   Family and Social History:  Family History Non-Contributory   Social History negative tobacco, negative ETOH   Place of Living Home   Review of Systems:  Fever/Chills No   Cough No   Sputum No   Abdominal Pain No   Physical Exam:  GEN well developed, well nourished, obese   HEENT pink conjunctivae   NECK supple   RESP normal resp effort   CARD regular rate   ABD denies tenderness   LYMPH negative neck   EXTR negative edema, Left knee painful with range of motion.  range of motion 5-95*.  Increased valgus.  circulation/sensation/motor function good.   SKIN normal to palpation   NEURO motor/sensory function intact   PSYCH alert, A+O to time, place, person    Assessment/Admission Diagnosis Advanced left knee osteoarthritis   Plan Left knee total knee replacement   Electronic Signatures: Park Breed (MD)  (Signed [phone] 22:40)  Authored: CHIEF COMPLAINT and HISTORY, PAST MEDICAL/SURGIAL HISTORY, ALLERGIES, HOME MEDICATIONS, FAMILY AND SOCIAL HISTORY, REVIEW OF SYSTEMS, PHYSICAL EXAM, ASSESSMENT AND PLAN   Last Updated: 29-Nov-15 22:40 by Park Breed (MD)

## 2014-05-16 NOTE — Op Note (Signed)
PATIENT NAME:  Nicholas [name], Nicholas [name] MR#:  [id number] DATE OF BIRTH:  Mar 29, 1945  DATE OF PROCEDURE:  12/22/2013  PREOPERATIVE DIAGNOSIS:  Advanced osteoarthritis left knee.   POSTOPERATIVE DIAGNOSIS:  Advanced osteoarthritis left knee.   OPERATION: Antibiotic cemented [place] LCS left total knee replacement (large femur/patella, number 5 keeled tibia, 10 mm polyethylene rotating platform insert).   SURGEON: Park Breed, MD.   ANESTHESIA: Spinal with narcotics.   COMPLICATIONS: None.   DRAINS: Two Autovacs.   ESTIMATED BLOOD LOSS: 50 mL.    REPLACEMENT: None.   OPERATIVE PROCEDURE: The patient was brought to the operating room where he underwent satisfactory spinal anesthesia and was placed in the supine position and padded appropriately on the operating room table. A small bump was placed under the left hip. The patient's left leg was prepped and draped in sterile fashion after IV vancomycin had been instilled. The left leg was prepped and draped in sterile fashion and an Esmarch was applied. The tourniquet was inflated to 300 mmHg. Tourniquet time was 105 minutes. A straight midline anterior incision was made over the front of the left knee. This was well away from his previous medial incision from years ago. His soft tissues were dissected sharply and a medial arthrotomy carried out. Soft tissue dissection was carried out. The tibial alignment jig was put in place and pinned in appropriate alignment. The proximal tibial cut was made and soft tissue debridement completed. The ligaments were balanced by a minimal release medially. The anterior cutting jig was put in place with a centering hole and the rotation guide inserted. This showed excellent position and alignment. The anterior cutting jig was pinned in place and the anterior posterior cuts made. The spacer block showed a good 10 mm flexion gap with good stability. Alignment was good. The distal femoral cutting guide to 4 degrees valgus was  inserted and pinned in place. The distal cuts were made as well. The spacer block was placed in extension and the knee was stable with good full extension and no mediolateral laxity. This was removed and the finishing guide was inserted and pinned in place. The finishing cuts were made. The McHale retractor was inserted to expose the tibia well and a centering hole made in the tibia with a number 5 tibial guide. The 10 mm trial insert was put in place along with the large femoral component and the knee articulated well. It was stable and had good motion. The patella was cut and drilled and a trial inserted. This tracked well. The trials were all removed and the knee fully irrigated, dried, and final soft tissue dissection carried out. The soft tissues were infiltrated with Exparel 60 mL total fluid. The tissues were again irrigated and dried the components were all cemented in place with antibiotic cement. There was a number 5 keeled tibia, large femur, large patella, and a 10 mm rotating platform. All excess cement was removed and the cement was allowed to harden fully. Knee articulated well. After irrigation the soft tissues were infiltrated with 0.25% Marcaine with epinephrine, morphine, and Toradol. The Autovac drains were inserted. The capsule was closed with running number 2 Quill with the knee flexed over a triangle. After irrigation the subcutaneous tissue was closed with 0 Quill. The skin was closed with staples. The skin was dried and TENS pads applied. An Aquacel dressing was applied. The knee was wrapped with cotton padding and bias-cut stockinette. The tourniquet was deflated with good return of blood flow to  the foot. A Polar Care and knee immobilizer were applied. The patient was transferred to his hospital bed and taken to recovery in good condition.    ____________________________ Park Breed, MD hem:bu D: 12/22/2013 16:10:02 ET T: 12/22/2013 17:35:53 ET JOB#: [id number]  cc: Park Breed, MD, <Dictator> Park Breed MD ELECTRONICALLY SIGNED 12/22/2013 22:41

## 2014-05-16 NOTE — Discharge Summary (Signed)
PATIENT NAME:  Nicholas [name], Nicholas [name] MR#:  [id number] DATE OF BIRTH:  [birth year]  DATE OF ADMISSION:  12/22/2013 DATE OF DISCHARGE:  12/25/2013  FINAL DIAGNOSES: 1.  Advanced osteoarthritis of the left knee.  2.  History of pulmonary embolism. 3.  Morbid obesity. 4.  Benign prostatic hypertrophy.  5.  Hypertension.   OPERATION: On 12/22/2013, antibiotic cemented left [place] LCS total knee replacement.   COMPLICATIONS: None.   CONSULTATIONS: [facility].   DISCHARGE MEDICATIONS: Eliquis 5 mg b.i.d., Vicodin 7.5/325 p.r.n. pain, Neurontin 400 mg b.i.d.. Home medications as prior to admission.   HISTORY: The patient is a 69 year old male with progressive painful left knee arthritis. He had failed conservative treatment with NSAIDs prior to his pulmonary embolus, injections, bracing and exercise. This interfered with all his daily activities and sleep. He wished to proceed with knee replacement. He had bilateral total hip replacements in the past and was familiar with the procedures. The risks and benefits and postoperative protocols were discussed with him at length.   PAST MEDICAL HISTORY: Hypertension, prosthetic hypertrophy.  PAST SURGICAL HISTORY: Bilateral hip replacements, vasectomy, left knee surgery many years ago.   ALLERGIES: None.   HOME MEDICATIONS: Eliquis, Seroquel, venlafaxine, fish oil, gabapentin, vitamins, simvastatin.  REVIEW OF SYSTEMS: Unremarkable.   FAMILY HISTORY: Unremarkable.   SOCIAL HISTORY: The patient does not smoke or drink currently.   PHYSICAL EXAMINATION: A healthy overweight male in no distress. Both total hips have good motion and stability. Both knees show pain medially with motion from 5 to 90 degrees. Knees stable. Neurovascular status intact distally. There is no peripheral edema. Lachman and anterior drawer negative.   DIAGNOSTIC DATA: Laboratory on admission was satisfactory.   HOSPITAL COURSE: The patient stopped his Eliquis 3 days prior  surgery. On 12/22/2013, he underwent cemented left total knee replacement under spinal anesthesia without problems. He did exhibit some atrial flutter in the recovery room. He was seen by [facility] cardiology and since his blood pressure and heart rate were stable no active treatment was recommended. Postoperatively, the patient did well. Hemoglobin was 13.1 on the second postop day. He made good progress with physical therapy and had minimal pain. The wound was benign and dressings were changed on the second postop day. He was stable and ready for discharge 12/25/2013. He will get home health PT. He will be partial weight bearing. He will remain on Eliquis. He will return to the office in 7 to 10 days for examination.  ____________________________ Park Breed, MD hem:sb D: 12/25/2013 10:54:02 ET T: 12/25/2013 14:12:04 ET JOB#: [id number]  cc: Park Breed, MD, <Dictator> Park Breed MD ELECTRONICALLY SIGNED 12/26/2013 13:19

## 2014-05-16 NOTE — Consult Note (Signed)
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 _____________   Past Medical History ??? Hypertension  ??? High cholesterol  ??? Pulmonary embolism    a. 03/2013 in setting of DVT;  b. 03/2013 Echo showed nl LV, dilated RV;  c. 10/2013 Echo: EF 50%, diff HK, mildly dil Ao Root, mildly dil LA. ??? DVT (deep venous thrombosis)    a. 03/2013  ??? Arthritis    a. knees, hips, hands;  b. 11/2013 s/p L TKA @ [facility]. ??? Bulging lumbar disc  ??? Anxiety  ??? Depression  ??? First degree AV block  ??? Mobitz type I Wenckebach atrioventricular block   Past Surgical History ??? Total hip arthroplasty Right 2004 ??? Total hip arthroplasty Left 2006 ??? Joint replacement   ??? Knee cartilage surgery Left 1965   "football injury" ??? Knee arthroscopy Right 2013 ??? Cataract extraction w/ intraocular lens  implant, bilateral Bilateral ~ 2010 ??? Pilonidal cyst excision  1970's ??? Cardiac catheterization  ~ 2005   "once" ??? Left total knee arthroplasty     a. 11/2013 [facility]. ____________   Present Illness 69 y/o male with a prior h/o htn, hl, and DVT/PE in 03/2013 on oral anticoagulation since.  Echo at that time showed nl LV fxn with a dilated RV.  F/U echo in 10/2013 showed nl LV and RV fxn.  He remains on eliquis as an outpt.  He recently had a urologic procedure @ [facility] and was noted to have 1st deg AVB and Mobitz I heart block.  This was asymptomatic and he was referred to Dr. Caryl [name] for evaluation on 11/17.  No further cardiac w/u was felt to be necessary.    Nicholas [name] has chronic bilat knee pain and arthritis.  He presented yesterday for left total knee arthoplasty.  Post-op, he was noted to develop rate-controlled atrial flutter with variable  conduction.  He was asymptomatic.  He was admitted to tele and converted to sinus with 1st deg AVB and intermittent wenckebach @ 18:51 last night.  He's had no further atrial flutter.  CHA2DS2VASc = 2. _____________  Family History ??? Stroke Father deceased ??? Alcoholism Mother died in her 80's. _____________  Social History ??? Marital Status: Married   Spouse Name: N/A   Number of Children: N/A ??? Years of Education: N/A  Social History Main Topics ??? Smoking status: Never Smoker  ??? Smokeless tobacco: Never Used ??? Alcohol Use: 3.6 oz/week   6 Cans of beer per week ??? Drug Use: No ??? Sexual Activity: Not Currently  Social History Narrative  Lives in [place] with wife.  Does not routinely exercise.  Activity severely limited by bilateral knee pain. _____________   Physical Exam:  GEN well developed, well nourished   HEENT pink conjunctivae, moist oral mucosa   NECK Supple, no bruits.  Obese - difficult to guage JVP.   RESP normal resp effort  clear BS   CARD Regular rate and rhythm  Normal, S1, S2  No murmur   ABD denies tenderness  soft  normal BS   EXTR negative cyanosis/clubbing, negative edema, Left leg wrapped.   SKIN normal to palpation   NEURO motor/sensory function intact   PSYCH alert, A+O to time, place, person, good insight  Review of Systems:  Subjective/Chief Complaint Knee pain, swelling   General: No Complaints   Skin: No Complaints   ENT: No Complaints   Eyes: No Complaints   Neck: No Complaints   Respiratory: No Complaints   Cardiovascular: No Complaints   Gastrointestinal: No Complaints   Genitourinary: No Complaints   Vascular: No Complaints   Musculoskeletal: Muscle or joint pain  Muscle or Joint Stiffness   Neurologic: No Complaints   Hematologic: No Complaints   Endocrine: No Complaints   Psychiatric: No Complaints   Review of Systems: All other systems were reviewed and found to be negative    Medications/Allergies Reviewed Medications/Allergies reviewed          Admit Diagnosis:   LEFT TKR 27447.: Onset Date: 22-Dec-2013, Status: Active, Description: LEFT TKR [phone].  Home Medications: Medication Instructions Status  venlafaxine 225 mg oral tablet, extended release 1 tab(s) orally once a day (in the morning)  Active  Eliquis 5 mg oral tablet 1 tab(s) orally 2 times a day Active  Fish Oil - oral capsule 1 cap(s) orally once a day Active  gabapentin 300 mg oral capsule 2 cap(s) orally once a day (in the morning) and one capsule at bedtime Active  multivitamin 1 tab(s) orally once a day Active  VESIcare 10 mg oral tablet 1 tab(s) orally once a day Active  simvastatin 20 mg oral tablet 1 tab(s) orally once a day (in the morning) Active  Norco 325 mg-5 mg oral tablet 1 tab(s) orally 2 times a day, As Needed Active  Ativan 1 mg oral tablet 1 tab(s) orally once a day (at bedtime) Active  Afrin 0.05% nasal spray 2 spray(s) nasal once a day (at bedtime) Active  Saline Mist 0.65% nasal spray 2 spray(s) nasal once a day (at bedtime) Active  SEROquel XR 150 mg oral tablet, extended release 1 tab(s) orally once a day (in the evening) Active   Lab Results:  Thyroid:  01-Dec-15 04:58   Thyroid Stimulating Hormone  < 0.010 (0.45-4.50 (IU = International Unit)  ----------------------- Pregnant patients have  different reference  ranges for TSH:  - - - - - - - - - -  Pregnant, first trimetser:  0.36 - 2.50 uIU/mL)  Routine Chem:  01-Dec-15 04:58   Glucose, Serum  116  BUN 18  Creatinine (comp) 1.06  Sodium, Serum 138  Potassium, Serum 4.7  Chloride, Serum 105  CO2, Serum 26  Calcium (Total), Serum  8.4  Anion Gap 7  Osmolality (calc) 279  eGFR (African American) >60  eGFR (Non-African American) >60 (eGFR values <42m/min/1.73 m2 may be an indication of chronic kidney disease (CKD). Calculated eGFR, using the MRDR Study equation, is useful in  patients with stable renal  function. The eGFR calculation will not be reliable in acutely ill patients when serum creatinine is changing rapidly. It is not useful in patients on dialysis. The eGFR calculation may not be applicable to patients at the low and high extremes of body sizes, pregnant women, and vegetarians.)  Result Comment POTASSIUM/CREATININE/BUN - Slight hemolysis, interpret results with  - caution.  Result(s) reported on 23 Dec 2013 at 05:49AM.  Routine Hem:  01-Dec-15 04:58   WBC (CBC) 8.7  RBC (CBC)  4.14  Hemoglobin (CBC) 13.0  Hematocrit (CBC)  39.5  Platelet Count (CBC)  123  MCV 95  MCH 31.4  MCHC 32.9  RDW 13.9  Neutrophil % 82.0  Lymphocyte % 7.8  Monocyte % 8.1  Eosinophil % 1.6  Basophil % 0.5  Neutrophil #  7.1  Lymphocyte #  0.7  Monocyte # 0.7  Eosinophil # 0.1  Basophil # 0.0 (Result(s) reported on 23 Dec 2013 at 05:49AM.)   EKG:  EKG Interp. by me  11/30 @ 16:39: aflutter, variable conduction, no acute st/t changes.   Interpretation No EKG, only tele strips   Radiology Results:  XRay:    30-Nov-15 16:17, Knee Left AP and Lateral  Knee Left AP and Lateral   REASON FOR EXAM:    post op  COMMENTS:   Bedside (portable):Y    PROCEDURE: DXR - DXR KNEE LEFT AP AND LATERAL  - Dec 22 2013  4:17PM     CLINICAL DATA:  Postop left total knee arthroplasty.    EXAM:  LEFT KNEE - 1-2 VIEW    COMPARISON:  10/29/2012.    FINDINGS:  The 3 components are well seated.  No complicating features.   IMPRESSION:  Well seated components of a total knee arthroplasty.      Electronically Signed    By: Kalman Jewels M.D.    On: 12/22/2013 16:30         Verified By: Marlane Hatcher, M.D.,    No Known Allergies:   Vital Signs/Nurse's Notes: **Vital Signs.:   01-Dec-15 08:01  Vital Signs Type Routine  Temperature Temperature (F) 98.5  Celsius 36.9  Temperature Source oral  Pulse Pulse 78  Respirations Respirations 17  Systolic BP Systolic BP 299  Diastolic BP  (mmHg) Diastolic BP (mmHg) 81  Mean BP 101  Pulse Ox % Pulse Ox % 95  Pulse Ox Activity Level  At rest  Oxygen Delivery 2L  Telemetry pattern Cardiac Rhythm Normal sinus rhythm; 1st Degree heart block; pattern reported by Telemetry Clerk; WAP    Impression 1.  Paroxysmal Atrial Flutter: Pt has a h/o 1st deg AVB and mobitz I HB and developed rate-controlled, asymptomatic atrial flutter post-operatively yesterday. Asymptomatic. Converted to sinus @ 18:51 last night. No further atrial flutter overnight CHA2DS2VASc = 2 (HTN and age>65). He has been on eliquis r/t PE in 03/2013. Echo in 10/2013 showed nl LV fxn with mildly dil LA.  No significant valvular abnormalities. ------>TSH ordered, He is hyperthyroid. Needs eval by endocrine. Unable to exclude this as a contributor to his arrhythmia --Eliquis ordered to be resumed this AM. --As he is prone to bradycardia and rate-controlled when in flutter, will not add bb/dilt. --F/U EP as an outpt for consideration of flutter ablation.  2.  H/O PE: S/P DVT and PE in 03/2013. --Eliquis ordered to be resumed this AM.  3.  HTN: This is listed in history however he is not on any meds for it. BP has been variable here. --Follow.  4.  DJD/S/P L TKA: --Per ortho/PT.  5.  HL: --On statin.   Electronic Signatures: Rogelia Mire (NP)  (Signed 01-Dec-15 09:47)  Authored: General Aspect/Present Illness, History and Physical Exam, Review of System, Home Medications, Labs, EKG , Allergies, Vital Signs/Nurse's Notes, Impression/Plan Ida Rogue (MD)  (Signed 01-Dec-15 10:23)  Authored: General Aspect/Present Illness, History and Physical Exam, Review of System, Health Issues, Labs, EKG , Radiology, Vital Signs/Nurse's Notes, Impression/Plan  Co-Signer: General Aspect/Present Illness, History and Physical Exam, Review of System, Home Medications, Labs, EKG , Allergies, Vital Signs/Nurse's Notes, Impression/Plan   Last Updated: 01-Dec-15  10:23 by Ida Rogue (MD)

## 2014-05-16 NOTE — Op Note (Signed)
PATIENT NAME:  Nicholas [name], Nicholas [name] MR#:  [id number] DATE OF BIRTH:  11-25-1945  DATE OF PROCEDURE:  11/26/2013  PREOPERATIVE DIAGNOSIS: Urethral stricture.   POSTOPERATIVE DIAGNOSIS:  Urethral stricture.   PROCEDURE PERFORMED: Retrograde urethrogram,  direct vision internal urethrotomy, cystoscopy.   ANESTHESIA: General anesthesia.   ATTENDING SURGEON: Sherlynn Stalls, MD   ESTIMATED BLOOD LOSS: Minimal.   DRAINS: 20 [place] tip Foley catheter.   SPECIMENS: None.   COMPLICATIONS: None.   INDICATION: This is a 68 year old male with increasing difficulty voiding who underwent attempted office cystoscopy and was found to have significant urethral stricture. She was counseled to undergo dilation versus DVIU in the operating room with a retrograde urethrogram prior to evaluate the extent of his urethral disease as well as cystoscopy at the time of the procedure. Risks and benefits of the procedure were explained in detail. The patient agreed to proceed as planned.  He had been treated adequately for a urinary tract infection 2 days prior to the procedure. Risks and benefits of the procedure were explained in detail. The patient agreed to proceed as planned.   DESCRIPTION OF THE PROCEDURE: The patient was correctly identified of two holding area and informed consent was obtained. He was brought to the operating suite and placed on the table in supine position. At this time, universal timeout protocol was performed. All team members were identified and Venodyne boots were placed and he was administered IV vancomycin and gentamicin in the perioperative period given his previously positive urine culture and sensitivity data. He was then placed under general anesthesia, repositioned lower on the bed and placed in a left lateral decubitus position with his hips in oblique position. He was prepped with Betadine solution. At this point in time, a retrograde urethrogram was performed using a catheter  tipped syringe injecting contrast per urethral meatus. X-ray fluoroscopic images were then obtained, which revealed multiple tight concentric urethral strictures extending the entire length of the penile urethra. This was all the way to the level of the bulbar urethra. Proximal to this, the posterior urethra was noted to be fairly dilated.   The patient was then repositioned in the supine position and then placed in the dorsal lithotomy position and reprepped and draped in the standard surgical fashion. A rigid urethrotome scope using the 0 degrees lens was then advanced per urethra. Within the distal penile urethra, the first of many strictures was then identified, which was fairly narrow, and the scope could not pass beyond this.  The urethrotome blade was then used at the 12 o'clock position to incise the stricture. This was performed approximately 4 or 5 times along the entire length of the penile urethra.  The tightest stricture was identified in the bulb. Given the severity of the stricture, a wire was placed to the stricture in order to help identify the true lumen.  The urethrotome was then used again to incise this at the 12 o'clock position which opened it up nicely. The scope was then able to be easily advanced through the posterior urethra, which was dilated. In fact the prostatic length was relatively short and very dilated. The bladder then was surveyed. It was moderately trabeculated. The entire mucosa was inspected and there was no evidence of mucosal lesions. No diverticula noted. The bilateral ureteral orifices were identified and noted to have clear efflux of urine. The scope was then removed. Minimal bleeding was noted from the urethral meatus. The 20 [place] tip catheter was advanced over  the wire into the bladder.  The wire was removed. The balloon was inflated with 10 mL of sterile water. The catheter was then secured to the patient's left leg using a leg bag strap. 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   The patient was then reversed from anesthesia after being placed in the supine position and taken to the PACU in stable condition. There were no complications in this case     ____________________________ Sherlynn Stalls, MD ajb:jp D: 11/26/2013 10:49:48 ET T: 11/26/2013 12:01:31 ET JOB#: [id number]  cc: Sherlynn Stalls, MD, <Dictator> Sherlynn Stalls MD ELECTRONICALLY SIGNED 12/11/2013 15:17

## 2014-05-24 NOTE — Op Note (Signed)
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   OPERATION PERFORMED: DePuy rotating platform cemented LCS total knee replacement (large femur/patella, #5 Keeled tibia, 10 mm polyethylene insert).   SURGEON:  Park Breed, MD   ASSISTANT: Dr. Tamala Julian.   ANESTHESIA: Spinal.   COMPLICATIONS: None.   DRAINS: Two Autovac drains.   ESTIMATED BLOOD LOSS: 50 mL.  BLOOD REPLACED: None.   DESCRIPTION OF PROCEDURE: The patient was brought to the operating room, where he underwent satisfactory spinal anesthesia and was positioned appropriately on the operating room table and padded. Right leg was prepped and draped in sterile fashion, an Esmarch was applied. The tourniquet was inflated to 350 mmHg. Tourniquet time was 106 minutes. An anterior midline incision was made and dissection carried out sharply through subcutaneous tissue. Medial arthrotomy was carried out and a soft tissue debridement was carried out. The tibial alignment jig was pinned in place, and the proximal the tibial cuts were made. The ligaments were slightly tight laterally and a little bit of the lateral capsule was released. This balanced the ligaments well. The femur was sized as a #5, and a centering hole was made. The rotation guide was inserted and the alignment was good. The anterior cutting block was pinned, and the  anterior and posterior cuts were made on the femur. The 4-degree valgus distal femoral cutting block was inserted and the distal cuts made. The finishing guide was applied, and the finishing cuts made. The tibia was sized as #5, and the tibial guide was pinned in place. A centering hole was made and the 10 mm polyethylene insert and large femoral trial were inserted. The knee  articulated well with good range of motion with full extension and good stability. The patella was cut and drilled, and the patella trial tracked well. The trials were removed and the knee was thoroughly irrigated while cement was mixed. Exparel 60 mL were injected into the tissues. After further irrigation and drying of all the bony surfaces, the #5 keeled tibia, large femur, and large patella were cemented in place, along with a 10 mm rotating platform polyethylene insert. The cement was allowed to dry with the knee in full extension after removing all excess cement. Range of motion was excellent. The Autovac drains were inserted and after further irrigation, 60 mL of 0.25% Marcaine with epinephrine, morphine, and Toradol was injected into the tissues. The capsule was closed with #2 Quill. Subcutaneous tissue was closed with 0 Quill over a triangle device. The skin was closed with staples and Aquacel dressing applied. Further dry sterile dressing and padding and bias-cut stockinette were used to complete the dressing. The tourniquet was deflated with good return of blood flow to the foot. A Polar Care and knee immobilizer were applied. The patient was transferred to his hospital bed and taken to recovery in good condition    ____________________________ Park Breed, MD hem:mw D: 02/25/2014 10:31:37 ET T: 02/25/2014 13:31:02 ET JOB#: [id number]  cc: Park Breed, MD, <Dictator> Park Breed MD ELECTRONICALLY SIGNED 02/26/2014 12:51

## 2014-05-24 NOTE — Discharge Summary (Signed)
PATIENT NAME:  Nicholas [name], Nicholas [name] MR#:  [id number] DATE OF BIRTH:  [birth year]  DATE OF ADMISSION:  02/25/2014 DATE OF DISCHARGE:  02/28/2014  DISCHARGE DIAGNOSES:  1.  Advanced osteoarthritis, right knee. 2.  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  4.  Hypertension.  OPERATION: On 02/25/2014, cemented DePuy rotating platform LCS right total knee replacement.   COMPLICATIONS: None.   CONSULTATIONS: None.   DISCHARGE MEDICATIONS: Norco 7.5/325 p.r.n. pain, Eliquis 5 mg b.i.d., iron 1 p.o. daily for 6 weeks, venlafaxine 1 p.o. daily, simvastatin 20 mg daily, Ativan 1 mg at bedtime, gabapentin 400 mg b.i.d.   HISTORY OF PRESENT ILLNESS: The patient is a 69 year old male with advanced osteoarthritis of the right knee. He failed all conservative treatments including injections, NSAIDs, bracing and exercise. He had left total knee replacement done 2 months ago and has done very and wished to proceed with one on the right. The risks and benefits were discussed with him. He was cleared by his pulmonary doctor and stopped Eliquis 3 days prior to surgery.   PAST MEDICAL HISTORY: Illnesses as above.   MEDICATIONS: As above.   ALLERGIES: None.   REVIEW OF SYSTEMS: Unremarkable.   FAMILY HISTORY: Unremarkable.   PAST SURGICAL HISTORY: Bilateral total hip replacements, left total knee replacement, vasectomy, urethral stricture stretching.  SOCIAL HISTORY: The patient lives with his wife. He does not smoke or drink.   PHYSICAL EXAMINATION: The patient was alert and cooperative. His right knee showed increased valgus with motion from 0 to 95 degrees. He had joint line pain and mild instability. Neurovascular status was good distally. There was no significant edema distally and the skin was intact. The left total knee replacement showed excellent healing with good motion and stability.   LABORATORY DATA: On admission was satisfactory.   HOSPITAL COURSE: On to  02/25/2014, the patient underwent cemented DePuy LCS rotating platform total knee replacement. Postoperatively, he did well with minimal pain. Hemoglobin was 10.6 on the second postop day. He was ambulatory with physical therapy. He is to be discharged tomorrow, 02/28/2014, following physical therapy. He will remain partial weight-bearing on the right leg and get home health PT. He will return to my office in 7 to 10 days for exam and x-rays.   ____________________________ Park Breed, MD hem:sb D: 02/27/2014 12:28:00 ET T: 02/27/2014 13:29:48 ET JOB#: [id number]  cc: Park Breed, MD, <Dictator> Park Breed MD ELECTRONICALLY SIGNED 03/01/2014 21:18

## 2014-05-24 NOTE — H&P (Signed)
   Subjective/Chief Complaint Right knee pain   History of Present Illness 69 year old male presents for right total knee replacement due to failure of conservative treatment for many years including bracing, injections, and exercise.  He had a left total knee replacement 2 months ago and is doing very well and wishes to proceed with the right total knee replacement. He has a history of pulmonary emboli and has stopped his Eliquis.  He did not have any deep venous thrombosis or pulmonary embolism problems after the prior surgery.  Risks and benefits of surgery were discussed at length including but not limited to infection, non union, nerve or blood vessed damage, non union, need for repeat surgery, blood clots and lung emboli, and death.  X-rays show advanced osteoarthritis tricompartmentally and sclerosis. Mild angular deformtiy.   Past Medical Health Pulmonary emboli, obesity, high cholesterol   Code Status Full Code   Past Med/Surgical Hx:  urethra stretched:   HTN:   Hip Replacement - Right: revision 2007  left total hip replacement:   Vasectomy:   Knee Surgery - Left:   ALLERGIES:  No Known Allergies:   HOME MEDICATIONS: Medication Instructions Status  venlafaxine 225 mg oral tablet, extended release 1 tab(s) orally once a day (in the morning)  Active  Eliquis 5 mg oral tablet 1 tab(s) orally 2 times a day Active  multivitamin 1 tab(s) orally once a day Active  VESIcare 10 mg oral tablet 1 tab(s) orally once a day Active  simvastatin 20 mg oral tablet 1 tab(s) orally once a day (in the morning) Active  Norco 325 mg-5 mg oral tablet 1 tab(s) orally 2 times a day, As Needed Active  Ativan 1 mg oral tablet 1 tab(s) orally once a day (at bedtime) Active  Afrin 0.05% nasal spray 2 spray(s) nasal once a day (at bedtime) Active  Saline Mist 0.65% nasal spray 2 spray(s) nasal once a day (at bedtime) Active  Fish Oil 1000 mg oral capsule 1 cap(s) orally once a day Active  gabapentin 400 mg  oral capsule 1 cap(s) orally 2 times a day Active  omeprazole 40 mg oral delayed release capsule 1 cap(s) orally once a day (in the morning) Active  SEROquel XR 150 mg oral tablet, extended release 1 tab(s) orally once a day (at bedtime) Active   Family and Social History:  Family History Non-Contributory   Social History negative tobacco   Place of Living Home   Review of Systems:  Fever/Chills No   Cough No   Sputum No   Abdominal Pain No   Diarrhea No   Physical Exam:  GEN well developed, well nourished, obese   HEENT pink conjunctivae   NECK supple   RESP normal resp effort   CARD regular rate   ABD denies tenderness   LYMPH negative neck   EXTR Tender joint line, range of motion 0-100*, circulation/sensation/motor function good. skin intact, ligaments stable   SKIN normal to palpation   NEURO motor/sensory function intact   PSYCH alert, A+O to time, place, person    Assessment/Admission Diagnosis Advanced osteoarthritis right knee   Plan Right total knee replacement   Electronic Signatures: Park Breed (MD)  (Signed [phone] 22:47)  Authored: CHIEF COMPLAINT and HISTORY, PAST MEDICAL/SURGIAL HISTORY, ALLERGIES, HOME MEDICATIONS, FAMILY AND SOCIAL HISTORY, REVIEW OF SYSTEMS, PHYSICAL EXAM, ASSESSMENT AND PLAN   Last Updated: 02-Feb-16 22:47 by Park Breed (MD)

## 2014-07-13 ENCOUNTER — Other Ambulatory Visit: Payer: Self-pay | Admitting: Family Medicine

## 2014-07-13 DIAGNOSIS — Z96649 Presence of unspecified artificial hip joint: Principal | ICD-10-CM | POA: Insufficient documentation

## 2014-07-13 DIAGNOSIS — T84098D Other mechanical complication of other internal joint prosthesis, subsequent encounter: Secondary | ICD-10-CM

## 2014-07-13 DIAGNOSIS — T84098A Other mechanical complication of other internal joint prosthesis, initial encounter: Secondary | ICD-10-CM | POA: Insufficient documentation

## 2014-07-13 MED ORDER — HYDROCODONE-ACETAMINOPHEN 5-325 MG PO TABS: 1.0000 | ORAL_TABLET | Freq: Two times a day (BID) | ORAL | Status: DC | PRN

## 2014-07-20 ENCOUNTER — Other Ambulatory Visit: Payer: Self-pay

## 2014-07-30 DIAGNOSIS — Z96652 Presence of left artificial knee joint: Secondary | ICD-10-CM | POA: Diagnosis not present

## 2014-07-30 DIAGNOSIS — M02811 Other reactive arthropathies, right shoulder: Secondary | ICD-10-CM | POA: Diagnosis not present

## 2014-08-12 ENCOUNTER — Other Ambulatory Visit: Payer: Self-pay | Admitting: Family Medicine

## 2014-08-12 MED ORDER — HYDROCODONE-ACETAMINOPHEN 5-325 MG PO TABS: 1.0000 | ORAL_TABLET | Freq: Two times a day (BID) | ORAL | Status: DC | PRN

## 2014-09-05 ENCOUNTER — Other Ambulatory Visit: Payer: Self-pay | Admitting: Family Medicine

## 2014-09-07 ENCOUNTER — Other Ambulatory Visit: Payer: Self-pay | Admitting: Family Medicine

## 2014-09-07 MED ORDER — LORAZEPAM 1 MG PO TABS: 1.0000 mg | ORAL_TABLET | Freq: Every day | ORAL | Status: DC

## 2014-09-21 ENCOUNTER — Other Ambulatory Visit: Payer: Self-pay | Admitting: Family Medicine

## 2014-09-21 MED ORDER — HYDROCODONE-ACETAMINOPHEN 5-325 MG PO TABS: 1.0000 | ORAL_TABLET | Freq: Two times a day (BID) | ORAL | Status: DC | PRN

## 2014-10-15 ENCOUNTER — Telehealth: Payer: Self-pay

## 2014-10-15 NOTE — Telephone Encounter (Signed)
Pt pharmacy sent a PA for vesicare. Pt has not been seen in over a year. LMOM for pt to call and make an appt. Made pt aware would give enough medication til appt.

## 2014-10-20 ENCOUNTER — Ambulatory Visit (INDEPENDENT_AMBULATORY_CARE_PROVIDER_SITE_OTHER): Payer: BLUE CROSS/BLUE SHIELD | Admitting: Family Medicine

## 2014-10-20 ENCOUNTER — Encounter: Payer: Self-pay | Admitting: Family Medicine

## 2014-10-20 VITALS — BP 170/92 | HR 56 | Temp 98.6°F | Ht 70.2 in | Wt 284.0 lb

## 2014-10-20 DIAGNOSIS — B351 Tinea unguium: Secondary | ICD-10-CM | POA: Diagnosis not present

## 2014-10-20 DIAGNOSIS — Z23 Encounter for immunization: Secondary | ICD-10-CM

## 2014-10-20 DIAGNOSIS — T84099D Other mechanical complication of unspecified internal joint prosthesis, subsequent encounter: Secondary | ICD-10-CM | POA: Diagnosis not present

## 2014-10-20 DIAGNOSIS — R739 Hyperglycemia, unspecified: Secondary | ICD-10-CM

## 2014-10-20 DIAGNOSIS — I10 Essential (primary) hypertension: Secondary | ICD-10-CM | POA: Diagnosis not present

## 2014-10-20 DIAGNOSIS — E785 Hyperlipidemia, unspecified: Secondary | ICD-10-CM

## 2014-10-20 DIAGNOSIS — F339 Major depressive disorder, recurrent, unspecified: Secondary | ICD-10-CM | POA: Insufficient documentation

## 2014-10-20 DIAGNOSIS — F329 Major depressive disorder, single episode, unspecified: Secondary | ICD-10-CM

## 2014-10-20 DIAGNOSIS — T84098D Other mechanical complication of other internal joint prosthesis, subsequent encounter: Secondary | ICD-10-CM

## 2014-10-20 DIAGNOSIS — F32A Depression, unspecified: Secondary | ICD-10-CM

## 2014-10-20 DIAGNOSIS — F419 Anxiety disorder, unspecified: Secondary | ICD-10-CM | POA: Insufficient documentation

## 2014-10-20 DIAGNOSIS — Z96649 Presence of unspecified artificial hip joint: Secondary | ICD-10-CM

## 2014-10-20 LAB — LP+ALT+AST PICCOLO, WAIVED
ALT (SGPT) Piccolo, Waived: 23 U/L (ref 10–47)
AST (SGOT) Piccolo, Waived: 40 U/L — ABNORMAL HIGH (ref 11–38)
CHOL/HDL RATIO PICCOLO,WAIVE: 2.9 mg/dL
Cholesterol Piccolo, Waived: 178 mg/dL (ref <200)
HDL Chol Piccolo, Waived: 61 mg/dL (ref >59)
LDL Chol Calc Piccolo Waived: 87 mg/dL (ref <100)
Triglycerides Piccolo,Waived: 147 mg/dL (ref <150)
VLDL CHOL CALC PICCOLO,WAIVE: 29 mg/dL (ref <30)

## 2014-10-20 LAB — BAYER DCA HB A1C WAIVED: HB A1C (BAYER DCA - WAIVED): 5.7 % (ref <7.0)

## 2014-10-20 MED ORDER — FLUOXETINE HCL 20 MG PO TABS: 20.0000 mg | ORAL_TABLET | Freq: Every day | ORAL | Status: DC

## 2014-10-20 MED ORDER — APIXABAN 5 MG PO TABS: 5.0000 mg | ORAL_TABLET | Freq: Two times a day (BID) | ORAL | Status: DC

## 2014-10-20 MED ORDER — SIMVASTATIN 20 MG PO TABS: 20.0000 mg | ORAL_TABLET | Freq: Every day | ORAL | Status: DC

## 2014-10-20 MED ORDER — SOLIFENACIN SUCCINATE 10 MG PO TABS: 10.0000 mg | ORAL_TABLET | Freq: Every day | ORAL | Status: DC

## 2014-10-20 MED ORDER — QUETIAPINE FUMARATE ER 150 MG PO TB24: 150.0000 mg | ORAL_TABLET | Freq: Every day | ORAL | Status: DC

## 2014-10-20 MED ORDER — LORAZEPAM 1 MG PO TABS: 1.0000 mg | ORAL_TABLET | Freq: Every day | ORAL | Status: DC

## 2014-10-20 MED ORDER — GABAPENTIN 400 MG PO CAPS: 400.0000 mg | ORAL_CAPSULE | Freq: Three times a day (TID) | ORAL | Status: DC

## 2014-10-20 MED ORDER — TERBINAFINE HCL 250 MG PO TABS: 250.0000 mg | ORAL_TABLET | Freq: Every day | ORAL | Status: DC

## 2014-10-20 NOTE — Assessment & Plan Note (Signed)
Stable will continue current pain medications

## 2014-10-20 NOTE — Progress Notes (Signed)
BP 170/92 mmHg  Pulse 56  Temp(Src) 98.6 F (37 C)  Ht 5' 10.2" (1.783 m)  Wt 284 lb (128.822 kg)  BMI 40.52 kg/m2  SpO2 97%   Subjective:    Patient ID: Nicholas [name]., male    DOB: 05-09-1945, 69 y.o.   MRN: [id number]  HPI: Nicholas [name]. is a 69 y.o. male  Chief Complaint  Patient presents with  . Hyperlipidemia  . Hypertension  . Hyperglycemia   Patient accompanied by wife with multiple concerns Patient has marked onychomycosis changes both fingernails and toenails with painful changes especially toenails that he bumped into the bottom of this shoes.  Patient's depression medicine generic not doing as well will stress something else is name brands is gotten too expensive. Having problems with fatigue loss of interest in usual things. Patient's blood pressure elevated today previously has been doing well for 2 years off medication no changes noted. For follow-up lipids and medications no issues also follow-up elevated glucose history. Taking medications except for depression medicines without side effects taken faithfully and doing well. Relevant past medical, surgical, family and social history reviewed and updated as indicated. Interim medical history since our last visit reviewed. Allergies and medications reviewed and updated.  Review of Systems  Constitutional: Negative.   Respiratory: Negative.   Cardiovascular: Negative.     Per HPI unless specifically indicated above     Objective:    BP 170/92 mmHg  Pulse 56  Temp(Src) 98.6 F (37 C)  Ht 5' 10.2" (1.783 m)  Wt 284 lb (128.822 kg)  BMI 40.52 kg/m2  SpO2 97%  Wt Readings from Last 3 Encounters:  10/20/14 284 lb (128.822 kg)  04/28/14 277 lb (125.646 kg)  02/12/14 274 lb 9.6 oz (124.558 kg)    Physical Exam  Constitutional: He is oriented to person, place, and time. He appears well-developed and well-nourished. No distress.  HENT:  Head: Normocephalic and atraumatic.  Right Ear: Hearing  normal.  Left Ear: Hearing normal.  Nose: Nose normal.  Eyes: Conjunctivae and lids are normal. Right eye exhibits no discharge. Left eye exhibits no discharge. No scleral icterus.  Cardiovascular: Normal rate, regular rhythm and normal heart sounds.   Pulmonary/Chest: Effort normal and breath sounds normal. No respiratory distress.  Musculoskeletal: Normal range of motion.  Neurological: He is alert and oriented to person, place, and time.  Skin: Skin is intact. No rash noted.  Marked changes of all 20 nails of onychomycosis  Psychiatric: He has a normal mood and affect. His speech is normal and behavior is normal. Judgment and thought content normal. Cognition and memory are normal.    Results for orders placed or performed in visit on 10/20/14  Bayer DCA Hb A1c Waived  Result Value Ref Range   Bayer DCA Hb A1c Waived 5.7 <7.0 %  LP+ALT+AST Piccolo, Waived  Result Value Ref Range   ALT (SGPT) Piccolo, Waived 23 10 - 47 U/L   AST (SGOT) Piccolo, Waived 40 (H) 11 - 38 U/L   Cholesterol Piccolo, Waived 178 <200 mg/dL   HDL Chol Piccolo, Waived 61 >59 mg/dL   Triglycerides Piccolo,Waived 147 <150 mg/dL   Chol/HDL Ratio Piccolo,Waive 2.9 mg/dL   LDL Chol Calc Piccolo Waived 87 <100 mg/dL   VLDL Chol Calc Piccolo,Waive 29 <30 mg/dL      Assessment & Plan:   Problem List Items Addressed This Visit      Cardiovascular and Mediastinum   HTN (hypertension) (Chronic)  We'll observe blood pressure for now and stay and elevated will restart medications      Relevant Medications   apixaban (ELIQUIS) 5 MG TABS tablet   simvastatin (ZOCOR) 20 MG tablet     Musculoskeletal and Integument   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)    Stable will continue current pain medications      Onychomycosis    Discussed medication and treatment      Relevant Medications   terbinafine (LAMISIL) 250 MG tablet     Other   Depression    Will discontinue Effexor  immediate release and start Prozac patient education given      Relevant Medications   LORazepam (ATIVAN) 1 MG tablet   FLUoxetine (PROZAC) 20 MG tablet   Anxiety    Will continue lorazepam      Relevant Medications   LORazepam (ATIVAN) 1 MG tablet   FLUoxetine (PROZAC) 20 MG tablet    Other Visit Diagnoses    Blood glucose elevated    -  Primary    Relevant Orders    Bayer DCA Hb A1c Waived (Completed)    Essential hypertension, benign        Relevant Medications    apixaban (ELIQUIS) 5 MG TABS tablet    simvastatin (ZOCOR) 20 MG tablet    Other Relevant Orders    Basic metabolic panel    Hyperlipemia        Relevant Medications    apixaban (ELIQUIS) 5 MG TABS tablet    simvastatin (ZOCOR) 20 MG tablet    Other Relevant Orders    LP+ALT+AST Piccolo, Waived (Completed)    Immunization due        Relevant Orders    Flu Vaccine QUAD 36+ mos PF IM (Fluarix & Fluzone Quad PF) (Completed)        Follow up plan: Return in about 4 weeks (around 11/17/2014), or if symptoms worsen or fail to improve, for Follow-up new depression medications and blood pressure.

## 2014-10-20 NOTE — Assessment & Plan Note (Signed)
Will continue lorazepam

## 2014-10-20 NOTE — Assessment & Plan Note (Signed)
Discussed medication and treatment

## 2014-10-20 NOTE — Assessment & Plan Note (Signed)
Will discontinue Effexor immediate release and start Prozac patient education given

## 2014-10-20 NOTE — Assessment & Plan Note (Signed)
We'll observe blood pressure for now and stay and elevated will restart medications

## 2014-10-21 ENCOUNTER — Encounter: Payer: Self-pay | Admitting: Family Medicine

## 2014-10-21 LAB — BASIC METABOLIC PANEL
BUN / CREAT RATIO: 16 (ref 10–22)
BUN: 15 mg/dL (ref 8–27)
CO2: 27 mmol/L (ref 18–29)
Calcium: 9.6 mg/dL (ref 8.6–10.2)
Chloride: 101 mmol/L (ref 97–108)
Creatinine, Ser: 0.95 mg/dL (ref 0.76–1.27)
GFR calc Af Amer: 94 mL/min/{1.73_m2} (ref >59)
GFR, EST NON AFRICAN AMERICAN: 81 mL/min/{1.73_m2} (ref >59)
GLUCOSE: 116 mg/dL — AB (ref 65–99)
Potassium: 4.5 mmol/L (ref 3.5–5.2)
SODIUM: 142 mmol/L (ref 134–144)

## 2014-10-27 ENCOUNTER — Other Ambulatory Visit: Payer: Self-pay | Admitting: Family Medicine

## 2014-10-27 MED ORDER — HYDROCODONE-ACETAMINOPHEN 5-325 MG PO TABS: 1.0000 | ORAL_TABLET | Freq: Two times a day (BID) | ORAL | Status: DC | PRN

## 2014-11-17 ENCOUNTER — Ambulatory Visit (INDEPENDENT_AMBULATORY_CARE_PROVIDER_SITE_OTHER): Payer: BLUE CROSS/BLUE SHIELD | Admitting: Family Medicine

## 2014-11-17 ENCOUNTER — Encounter: Payer: Self-pay | Admitting: Family Medicine

## 2014-11-17 VITALS — BP 147/81 | HR 65 | Temp 98.4°F | Ht 71.3 in | Wt 292.0 lb

## 2014-11-17 DIAGNOSIS — F329 Major depressive disorder, single episode, unspecified: Secondary | ICD-10-CM

## 2014-11-17 DIAGNOSIS — F32A Depression, unspecified: Secondary | ICD-10-CM

## 2014-11-17 DIAGNOSIS — I10 Essential (primary) hypertension: Secondary | ICD-10-CM | POA: Diagnosis not present

## 2014-11-17 MED ORDER — VORTIOXETINE HBR 10 MG PO TABS: 10.0000 mg | ORAL_TABLET | Freq: Every day | ORAL | Status: DC

## 2014-11-17 NOTE — Assessment & Plan Note (Signed)
The current medical regimen is effective;  continue present plan and medications.  

## 2014-11-17 NOTE — Assessment & Plan Note (Signed)
Discussed chronic depression and difficulty with treatment. Discussed that about exhausted my knowledge base and need to do a referral Gave patient psychiatry phone numbers to call this week for appointment In the meantime gave her a coupon and Rx for Trintellix

## 2014-11-17 NOTE — Progress Notes (Signed)
BP 147/81 mmHg  Pulse 65  Temp(Src) 98.4 F (36.9 C)  Ht 5' 11.3" (1.811 m)  Wt 292 lb (132.45 kg)  BMI 40.38 kg/m2  SpO2 96%   Subjective:    Patient ID: Nicholas [name]., male    DOB: 1945-10-15, 69 y.o.   MRN: [id number]  HPI: Nicholas Skowronek. is a 69 y.o. male  Chief Complaint  Patient presents with  . Hypertension  . Depression   patient's blood pressures been doing well no complaints from medications which is been taking faithfully.  Patient's real problem is been depression hasn't been helped at all by fluoxetine Having increasing irritability Sad and blue no thoughts of suicide Change from Effexor made no difference Patient has been back out playing golf and knees after knee replacement surgery or a little better. Sleeping okay as long as he takes Seroquel at bedtime  Relevant past medical, surgical, family and social history reviewed and updated as indicated. Interim medical history since our last visit reviewed. Allergies and medications reviewed and updated.  Review of Systems  Constitutional: Positive for fatigue. Negative for fever.  Respiratory: Negative.     Per HPI unless specifically indicated above     Objective:    BP 147/81 mmHg  Pulse 65  Temp(Src) 98.4 F (36.9 C)  Ht 5' 11.3" (1.811 m)  Wt 292 lb (132.45 kg)  BMI 40.38 kg/m2  SpO2 96%  Wt Readings from Last 3 Encounters:  11/17/14 292 lb (132.45 kg)  10/20/14 284 lb (128.822 kg)  04/28/14 277 lb (125.646 kg)    Physical Exam  Constitutional: He is oriented to person, place, and time. He appears well-developed and well-nourished. No distress.  HENT:  Head: Normocephalic and atraumatic.  Right Ear: Hearing normal.  Left Ear: Hearing normal.  Nose: Nose normal.  Eyes: Conjunctivae and lids are normal. Right eye exhibits no discharge. Left eye exhibits no discharge. No scleral icterus.  Cardiovascular: Normal rate, regular rhythm and normal heart sounds.   Pulmonary/Chest: Effort  normal and breath sounds normal. No respiratory distress.  Musculoskeletal: Normal range of motion.  Neurological: He is alert and oriented to person, place, and time.  Skin: Skin is intact. No rash noted.  Psychiatric: He has a normal mood and affect. His speech is normal and behavior is normal. Judgment and thought content normal. Cognition and memory are normal.    Results for orders placed or performed in visit on 10/20/14  Bayer DCA Hb A1c Waived  Result Value Ref Range   Bayer DCA Hb A1c Waived 5.7 <7.0 %  LP+ALT+AST Piccolo, Waived  Result Value Ref Range   ALT (SGPT) Piccolo, Waived 23 10 - 47 U/L   AST (SGOT) Piccolo, Waived 40 (H) 11 - 38 U/L   Cholesterol Piccolo, Waived 178 <200 mg/dL   HDL Chol Piccolo, Waived 61 >59 mg/dL   Triglycerides Piccolo,Waived 147 <150 mg/dL   Chol/HDL Ratio Piccolo,Waive 2.9 mg/dL   LDL Chol Calc Piccolo Waived 87 <100 mg/dL   VLDL Chol Calc Piccolo,Waive 29 <30 mg/dL  Basic metabolic panel  Result Value Ref Range   Glucose 116 (H) 65 - 99 mg/dL   BUN 15 8 - 27 mg/dL   Creatinine, Ser 0.95 0.76 - 1.27 mg/dL   GFR calc non Af Amer 81 >59 mL/min/1.73   GFR calc Af Amer 94 >59 mL/min/1.73   BUN/Creatinine Ratio 16 10 - 22   Sodium 142 134 - 144 mmol/L   Potassium 4.5  3.5 - 5.2 mmol/L   Chloride 101 97 - 108 mmol/L   CO2 27 18 - 29 mmol/L   Calcium 9.6 8.6 - 10.2 mg/dL      Assessment & Plan:   Problem List Items Addressed This Visit    None       Follow up plan: No Follow-up on file.

## 2014-11-24 DIAGNOSIS — Z961 Presence of intraocular lens: Secondary | ICD-10-CM | POA: Diagnosis not present

## 2014-11-26 DIAGNOSIS — M2041 Other hammer toe(s) (acquired), right foot: Secondary | ICD-10-CM | POA: Diagnosis not present

## 2014-11-26 DIAGNOSIS — M2012 Hallux valgus (acquired), left foot: Secondary | ICD-10-CM | POA: Diagnosis not present

## 2014-11-26 DIAGNOSIS — M2011 Hallux valgus (acquired), right foot: Secondary | ICD-10-CM | POA: Diagnosis not present

## 2014-11-26 DIAGNOSIS — M2042 Other hammer toe(s) (acquired), left foot: Secondary | ICD-10-CM | POA: Diagnosis not present

## 2014-12-01 ENCOUNTER — Ambulatory Visit (INDEPENDENT_AMBULATORY_CARE_PROVIDER_SITE_OTHER): Payer: BLUE CROSS/BLUE SHIELD | Admitting: Family Medicine

## 2014-12-01 ENCOUNTER — Other Ambulatory Visit: Payer: Self-pay | Admitting: Family Medicine

## 2014-12-01 ENCOUNTER — Encounter: Payer: Self-pay | Admitting: Family Medicine

## 2014-12-01 VITALS — BP 145/85 | HR 73 | Temp 98.4°F | Ht 71.5 in | Wt 293.0 lb

## 2014-12-01 DIAGNOSIS — F329 Major depressive disorder, single episode, unspecified: Secondary | ICD-10-CM

## 2014-12-01 DIAGNOSIS — F32A Depression, unspecified: Secondary | ICD-10-CM

## 2014-12-01 MED ORDER — VORTIOXETINE HBR 20 MG PO TABS: 20.0000 mg | ORAL_TABLET | Freq: Every day | ORAL | Status: DC

## 2014-12-01 MED ORDER — HYDROCODONE-ACETAMINOPHEN 5-325 MG PO TABS: 1.0000 | ORAL_TABLET | Freq: Two times a day (BID) | ORAL | Status: DC | PRN

## 2014-12-01 NOTE — Assessment & Plan Note (Signed)
Discussed depression care and treatment will increase Trintellix to 20 mg Recheck patient in 2 weeks Also discuss anxiety care and treatment with exercise limiting use of lorazepam Continue Seroquel

## 2014-12-01 NOTE — Progress Notes (Signed)
BP 145/85 mmHg  Pulse 73  Temp(Src) 98.4 F (36.9 C)  Ht 5' 11.5" (1.816 m)  Wt 293 lb (132.904 kg)  BMI 40.30 kg/m2  SpO2 97%   Subjective:    Patient ID: Nicholas Canterbury., male    DOB: 02/19/1945, 69 y.o.   MRN: [id number]  HPI: Nicholas Lavalley. is a 69 y.o. male  Chief Complaint  Patient presents with  . Depression   patient with no improvement on change considering going back on the medication having no side effects and is able to get the medicine that a reasonable cost. Taking Seroquel at bedtime which helps him sleep is doing well on that Anxiety is stable taking lorazepam 1 at bedtime also.   Relevant past medical, surgical, family and social history reviewed and updated as indicated. Interim medical history since our last visit reviewed. Allergies and medications reviewed and updated.  Review of Systems  Constitutional: Negative.   Respiratory: Negative.   Cardiovascular: Negative.     Per HPI unless specifically indicated above     Objective:    BP 145/85 mmHg  Pulse 73  Temp(Src) 98.4 F (36.9 C)  Ht 5' 11.5" (1.816 m)  Wt 293 lb (132.904 kg)  BMI 40.30 kg/m2  SpO2 97%  Wt Readings from Last 3 Encounters:  12/01/14 293 lb (132.904 kg)  11/17/14 292 lb (132.45 kg)  10/20/14 284 lb (128.822 kg)    Physical Exam  Constitutional: He is oriented to person, place, and time. He appears well-developed and well-nourished. No distress.  HENT:  Head: Normocephalic and atraumatic.  Right Ear: Hearing normal.  Left Ear: Hearing normal.  Nose: Nose normal.  Eyes: Conjunctivae and lids are normal. Right eye exhibits no discharge. Left eye exhibits no discharge. No scleral icterus.  Cardiovascular: Normal rate, regular rhythm and normal heart sounds.   Pulmonary/Chest: Effort normal and breath sounds normal. No respiratory distress.  Musculoskeletal: Normal range of motion.  Neurological: He is alert and oriented to person, place, and time.  Skin: Skin is  intact. No rash noted.  Psychiatric: He has a normal mood and affect. His speech is normal and behavior is normal. Judgment and thought content normal. Cognition and memory are normal.    Results for orders placed or performed in visit on 10/20/14  Bayer DCA Hb A1c Waived  Result Value Ref Range   Bayer DCA Hb A1c Waived 5.7 <7.0 %  LP+ALT+AST Piccolo, Waived  Result Value Ref Range   ALT (SGPT) Piccolo, Waived 23 10 - 47 U/L   AST (SGOT) Piccolo, Waived 40 (H) 11 - 38 U/L   Cholesterol Piccolo, Waived 178 <200 mg/dL   HDL Chol Piccolo, Waived 61 >59 mg/dL   Triglycerides Piccolo,Waived 147 <150 mg/dL   Chol/HDL Ratio Piccolo,Waive 2.9 mg/dL   LDL Chol Calc Piccolo Waived 87 <100 mg/dL   VLDL Chol Calc Piccolo,Waive 29 <30 mg/dL  Basic metabolic panel  Result Value Ref Range   Glucose 116 (H) 65 - 99 mg/dL   BUN 15 8 - 27 mg/dL   Creatinine, Ser 0.95 0.76 - 1.27 mg/dL   GFR calc non Af Amer 81 >59 mL/min/1.73   GFR calc Af Amer 94 >59 mL/min/1.73   BUN/Creatinine Ratio 16 10 - 22   Sodium 142 134 - 144 mmol/L   Potassium 4.5 3.5 - 5.2 mmol/L   Chloride 101 97 - 108 mmol/L   CO2 27 18 - 29 mmol/L   Calcium 9.6 8.6 -  10.2 mg/dL      Assessment & Plan:   Problem List Items Addressed This Visit      Other   Depression - Primary    Discussed depression care and treatment will increase Trintellix to 20 mg Recheck patient in 2 weeks Also discuss anxiety care and treatment with exercise limiting use of lorazepam Continue Seroquel      Relevant Medications   Vortioxetine HBr (TRINTELLIX) 20 MG TABS       Follow up plan: Return in about 2 weeks (around 12/15/2014), or if symptoms worsen or fail to improve, for Med check follow-up.

## 2014-12-15 ENCOUNTER — Ambulatory Visit (INDEPENDENT_AMBULATORY_CARE_PROVIDER_SITE_OTHER): Payer: BLUE CROSS/BLUE SHIELD | Admitting: Family Medicine

## 2014-12-15 ENCOUNTER — Encounter: Payer: Self-pay | Admitting: Family Medicine

## 2014-12-15 VITALS — BP 164/81 | HR 69 | Temp 98.7°F | Ht 70.0 in | Wt 291.0 lb

## 2014-12-15 DIAGNOSIS — F32A Depression, unspecified: Secondary | ICD-10-CM

## 2014-12-15 DIAGNOSIS — Z01818 Encounter for other preprocedural examination: Secondary | ICD-10-CM

## 2014-12-15 DIAGNOSIS — F329 Major depressive disorder, single episode, unspecified: Secondary | ICD-10-CM

## 2014-12-15 MED ORDER — VENLAFAXINE HCL ER 37.5 MG PO CP24: 37.5000 mg | ORAL_CAPSULE | Freq: Every day | ORAL | Status: DC

## 2014-12-15 MED ORDER — VENLAFAXINE HCL ER 75 MG PO CP24: 75.0000 mg | ORAL_CAPSULE | Freq: Every day | ORAL | Status: DC

## 2014-12-15 NOTE — Progress Notes (Signed)
BP 164/81 mmHg  Pulse 69  Temp(Src) 98.7 F (37.1 C)  Ht 5\' 10"  (1.778 m)  Wt 291 lb (131.997 kg)  BMI 41.75 kg/m2  SpO2 96%   Subjective:    Patient ID: Nicholas [name]., male    DOB: September 21, 1945, 69 y.o.   MRN: [id number]  HPI: Nicholas [name]. is a 69 y.o. male  Chief Complaint  Patient presents with  . Depression  . surgical clearance   discussed depression Trintellix not seeming to help wants to go back on Effexor is that helped the most. Patient getting ready to have bilateral bunion surgery. Patient still taking Eliquis 2 times a day not have any problems with bleeding no leg symptoms, but does have swelling after being up all day goes away overnight No PND orthopnea  Need surgical clearance  Relevant past medical, surgical, family and social history reviewed and updated as indicated. Interim medical history since our last visit reviewed. Allergies and medications reviewed and updated.  Review of Systems  Constitutional: Positive for fatigue. Negative for fever.  HENT: Negative.   Eyes: Negative.   Respiratory: Negative.   Cardiovascular: Positive for leg swelling. Negative for chest pain and palpitations.  Gastrointestinal: Negative.   Endocrine: Negative.   Genitourinary: Negative.   Musculoskeletal: Positive for back pain, joint swelling and arthralgias.  Skin: Negative.   Allergic/Immunologic: Negative.   Neurological: Negative.   Hematological: Negative.   Psychiatric/Behavioral: The patient is nervous/anxious.     Per HPI unless specifically indicated above     Objective:    BP 164/81 mmHg  Pulse 69  Temp(Src) 98.7 F (37.1 C)  Ht 5\' 10"  (1.778 m)  Wt 291 lb (131.997 kg)  BMI 41.75 kg/m2  SpO2 96%  Wt Readings from Last 3 Encounters:  12/15/14 291 lb (131.997 kg)  12/01/14 293 lb (132.904 kg)  11/17/14 292 lb (132.45 kg)    Physical Exam  Constitutional: He is oriented to person, place, and time. He appears well-developed and  well-nourished. No distress.  HENT:  Head: Normocephalic and atraumatic.  Right Ear: Hearing normal.  Left Ear: Hearing normal.  Nose: Nose normal.  Eyes: Conjunctivae and lids are normal. Right eye exhibits no discharge. Left eye exhibits no discharge. No scleral icterus.  Cardiovascular: Normal rate, regular rhythm and normal heart sounds.   Pulmonary/Chest: Effort normal and breath sounds normal. No respiratory distress. He has no wheezes. He has no rales. He exhibits no tenderness.  Abdominal: Soft. He exhibits no distension. There is no tenderness. There is no rebound.  Musculoskeletal: Normal range of motion.  Neurological: He is alert and oriented to person, place, and time.  Skin: Skin is intact. No rash noted.  Psychiatric: He has a normal mood and affect. His speech is normal and behavior is normal. Judgment and thought content normal. Cognition and memory are normal.    Results for orders placed or performed in visit on 10/20/14  Bayer DCA Hb A1c Waived  Result Value Ref Range   Bayer DCA Hb A1c Waived 5.7 <7.0 %  LP+ALT+AST Piccolo, Waived  Result Value Ref Range   ALT (SGPT) Piccolo, Waived 23 10 - 47 U/L   AST (SGOT) Piccolo, Waived 40 (H) 11 - 38 U/L   Cholesterol Piccolo, Waived 178 <200 mg/dL   HDL Chol Piccolo, Waived 61 >59 mg/dL   Triglycerides Piccolo,Waived 147 <150 mg/dL   Chol/HDL Ratio Piccolo,Waive 2.9 mg/dL   LDL Chol Calc Piccolo Waived 87 <100 mg/dL  VLDL Chol Calc Piccolo,Waive 29 <30 mg/dL  Basic metabolic panel  Result Value Ref Range   Glucose 116 (H) 65 - 99 mg/dL   BUN 15 8 - 27 mg/dL   Creatinine, Ser 0.95 0.76 - 1.27 mg/dL   GFR calc non Af Amer 81 >59 mL/min/1.73   GFR calc Af Amer 94 >59 mL/min/1.73   BUN/Creatinine Ratio 16 10 - 22   Sodium 142 134 - 144 mmol/L   Potassium 4.5 3.5 - 5.2 mmol/L   Chloride 101 97 - 108 mmol/L   CO2 27 18 - 29 mmol/L   Calcium 9.6 8.6 - 10.2 mg/dL      Assessment & Plan:   Problem List Items  Addressed This Visit      Other   Preoperative clearance    Patient cleared for surgery We'll stop his blood thinners according to protocol We'll also stop his Fish oil according to protocol      Depression - Primary    Due to nonresponse to Trintellix and doing better with Effexor will change back to Effexor will decrease Trintellix to 10 mg a day for one week then discontinue and restart Effexor at 37.5 for a week then 75 Increasing by 75 mg each week to 225 mg      Relevant Medications   venlafaxine XR (EFFEXOR XR) 37.5 MG 24 hr capsule   venlafaxine XR (EFFEXOR XR) 75 MG 24 hr capsule       Follow up plan: Return in about 4 weeks (around 01/12/2015) for Follow-up med change.

## 2014-12-15 NOTE — Assessment & Plan Note (Signed)
Due to nonresponse to Trintellix and doing better with Effexor will change back to Effexor will decrease Trintellix to 10 mg a day for one week then discontinue and restart Effexor at 37.5 for a week then 75 Increasing by 75 mg each week to 225 mg

## 2014-12-15 NOTE — Assessment & Plan Note (Signed)
Patient cleared for surgery We'll stop his blood thinners according to protocol We'll also stop his Fish oil according to protocol

## 2014-12-16 ENCOUNTER — Other Ambulatory Visit: Payer: Self-pay | Admitting: Family Medicine

## 2014-12-16 MED ORDER — HYDROCODONE-ACETAMINOPHEN 5-325 MG PO TABS: 1.0000 | ORAL_TABLET | Freq: Two times a day (BID) | ORAL | Status: DC | PRN

## 2014-12-28 ENCOUNTER — Other Ambulatory Visit: Payer: Self-pay

## 2014-12-28 ENCOUNTER — Encounter
Admission: RE | Admit: 2014-12-28 | Discharge: 2014-12-28 | Disposition: A | Discharge status: Home / Self Care | Payer: BLUE CROSS/BLUE SHIELD | Source: Ambulatory Visit | Attending: Specialist | Admitting: Specialist

## 2014-12-28 DIAGNOSIS — Z01812 Encounter for preprocedural laboratory examination: Secondary | ICD-10-CM | POA: Insufficient documentation

## 2014-12-28 DIAGNOSIS — Z01810 Encounter for preprocedural cardiovascular examination: Secondary | ICD-10-CM | POA: Diagnosis present

## 2014-12-28 LAB — CBC
HCT: 46.3 % (ref 40.0–52.0)
Hemoglobin: 15.6 g/dL (ref 13.0–18.0)
MCH: 31.2 pg (ref 26.0–34.0)
MCHC: 33.6 g/dL (ref 32.0–36.0)
MCV: 92.9 fL (ref 80.0–100.0)
PLATELETS: 136 10*3/uL — AB (ref 150–440)
RBC: 4.99 MIL/uL (ref 4.40–5.90)
RDW: 13.3 % (ref 11.5–14.5)
WBC: 4.7 10*3/uL (ref 3.8–10.6)

## 2014-12-28 NOTE — Pre-Procedure Instructions (Signed)
Medical clearance on chart. 

## 2014-12-28 NOTE — Patient Instructions (Addendum)
  Your procedure is scheduled on: 01/06/15 Wed Report to Day Surgery. To find out your arrival time please call [phone] between 1PM - 3PM on 01/05/15 Tues.  Remember: Instructions that are not followed completely may result in serious medical risk, up to and including death, or upon the discretion of your surgeon and anesthesiologist your surgery may need to be rescheduled.    _x___ 1. Do not eat food or drink liquids after midnight. No gum chewing or hard candies.     ____ 2. No Alcohol for 24 hours before or after surgery.   ____ 3. Bring all medications with you on the day of surgery if instructed.    __x__ 4. Notify your doctor if there is any change in your medical condition     (cold, fever, infections).     Do not wear jewelry, make-up, hairpins, clips or nail polish.  Do not wear lotions, powders, or perfumes. You may wear deodorant.  Do not shave 48 hours prior to surgery. Men may shave face and neck.  Do not bring valuables to the hospital.    [facility] is not responsible for any belongings or valuables.               Contacts, dentures or bridgework may not be worn into surgery.  Leave your suitcase in the car. After surgery it may be brought to your room.  For patients admitted to the hospital, discharge time is determined by your                treatment team.   Patients discharged the day of surgery will not be allowed to drive home.   Please read over the following fact sheets that you were given:      _x___ Take these medicines the morning of surgery with A SIP OF WATER:    1. simvastatin (ZOCOR) 20 MG tablet  2. gabapentin (NEURONTIN) 400 MG capsule  3. HYDROcodone-acetaminophen (NORCO/VICODIN) 5-325 MG tablet as needed  4.venlafaxine XR (EFFEXOR XR) 75 MG 24 hr capsule  5.  6.  ____ Fleet Enema (as directed)   _x___ Use CHG Soap as directed  ____ Use inhalers on the day of surgery  ____ Stop metformin 2 days prior to surgery    ____ Take 1/2  of usual insulin dose the night before surgery and none on the morning of surgery.   __x__ Stop Coumadin/Plavix/aspirin on stop Eliquis 2 days before surgery  _x___ Stop Anti-inflammatories on 1 week before surgery may take Tylenol   __x__ Stop supplements until after surgery.  Stop fish oil 1 week before surgery ____ Bring C-Pap to the hospital.

## 2014-12-29 NOTE — Pre-Procedure Instructions (Signed)
EKG sent to [facility] from Anesthesia to check .

## 2015-01-06 ENCOUNTER — Encounter
Admission: RE | Disposition: A | Discharge status: Home / Self Care | Payer: Self-pay | Source: Ambulatory Visit | Attending: Specialist

## 2015-01-06 ENCOUNTER — Encounter: Payer: Self-pay | Admitting: *Deleted

## 2015-01-06 ENCOUNTER — Ambulatory Visit: Payer: BLUE CROSS/BLUE SHIELD | Admitting: Anesthesiology

## 2015-01-06 ENCOUNTER — Ambulatory Visit
Admission: RE | Admit: 2015-01-06 | Discharge: 2015-01-06 | Disposition: A | Discharge status: Home / Self Care | Payer: BLUE CROSS/BLUE SHIELD | Source: Ambulatory Visit | Attending: Specialist | Admitting: Specialist

## 2015-01-06 DIAGNOSIS — I10 Essential (primary) hypertension: Secondary | ICD-10-CM | POA: Insufficient documentation

## 2015-01-06 DIAGNOSIS — G4730 Sleep apnea, unspecified: Secondary | ICD-10-CM | POA: Insufficient documentation

## 2015-01-06 DIAGNOSIS — M2041 Other hammer toe(s) (acquired), right foot: Secondary | ICD-10-CM | POA: Insufficient documentation

## 2015-01-06 DIAGNOSIS — M21612 Bunion of left foot: Secondary | ICD-10-CM | POA: Diagnosis not present

## 2015-01-06 DIAGNOSIS — M2012 Hallux valgus (acquired), left foot: Secondary | ICD-10-CM | POA: Insufficient documentation

## 2015-01-06 DIAGNOSIS — F329 Major depressive disorder, single episode, unspecified: Secondary | ICD-10-CM | POA: Diagnosis not present

## 2015-01-06 DIAGNOSIS — E669 Obesity, unspecified: Secondary | ICD-10-CM | POA: Diagnosis not present

## 2015-01-06 DIAGNOSIS — M2042 Other hammer toe(s) (acquired), left foot: Secondary | ICD-10-CM | POA: Diagnosis not present

## 2015-01-06 DIAGNOSIS — M2011 Hallux valgus (acquired), right foot: Secondary | ICD-10-CM | POA: Diagnosis present

## 2015-01-06 DIAGNOSIS — K219 Gastro-esophageal reflux disease without esophagitis: Secondary | ICD-10-CM | POA: Insufficient documentation

## 2015-01-06 DIAGNOSIS — M21611 Bunion of right foot: Secondary | ICD-10-CM | POA: Diagnosis not present

## 2015-01-06 HISTORY — PX: HAMMER TOE SURGERY: SHX385

## 2015-01-06 HISTORY — PX: BUNIONECTOMY: SHX129

## 2015-01-06 SURGERY — CORRECTION, HAMMER TOE
Anesthesia: General | Laterality: Bilateral

## 2015-01-06 MED ORDER — GABAPENTIN 400 MG PO CAPS
400.0000 mg | ORAL_CAPSULE | Freq: Once | ORAL | Status: AC
  Administered 2015-01-06: 400 mg via ORAL

## 2015-01-06 MED ORDER — CEFAZOLIN SODIUM-DEXTROSE 2-3 GM-% IV SOLR: 2.0000 g | Freq: Once | INTRAVENOUS | Status: DC

## 2015-01-06 MED ORDER — FAMOTIDINE 20 MG PO TABS
20.0000 mg | ORAL_TABLET | Freq: Once | ORAL | Status: AC
  Administered 2015-01-06: 20 mg via ORAL

## 2015-01-06 MED ORDER — FAMOTIDINE 20 MG PO TABS
ORAL_TABLET | ORAL | Status: AC
  Filled 2015-01-06: qty 1

## 2015-01-06 MED ORDER — CEFAZOLIN SODIUM-DEXTROSE 2-3 GM-% IV SOLR
INTRAVENOUS | Status: AC
  Administered 2015-01-06: 2 g via INTRAVENOUS
  Filled 2015-01-06: qty 50

## 2015-01-06 MED ORDER — BUPIVACAINE-EPINEPHRINE (PF) 0.25% -1:200000 IJ SOLN
INTRAMUSCULAR | Status: AC
  Filled 2015-01-06: qty 30

## 2015-01-06 MED ORDER — NEOMYCIN-POLYMYXIN B GU 40-200000 IR SOLN
Status: AC
  Filled 2015-01-06: qty 2

## 2015-01-06 MED ORDER — BUPIVACAINE HCL (PF) 0.5 % IJ SOLN
INTRAMUSCULAR | Status: AC
  Filled 2015-01-06: qty 30

## 2015-01-06 MED ORDER — NEOMYCIN-POLYMYXIN B GU 40-200000 IR SOLN
Status: DC | PRN
  Administered 2015-01-06: 2 mL

## 2015-01-06 MED ORDER — HYDROCODONE-ACETAMINOPHEN 5-325 MG PO TABS
ORAL_TABLET | ORAL | Status: AC
  Filled 2015-01-06: qty 1

## 2015-01-06 MED ORDER — LACTATED RINGERS IV SOLN
INTRAVENOUS | Status: DC
  Administered 2015-01-06 (×2): via INTRAVENOUS

## 2015-01-06 MED ORDER — MIDAZOLAM HCL 2 MG/2ML IJ SOLN
INTRAMUSCULAR | Status: DC | PRN
  Administered 2015-01-06: 2 mg via INTRAVENOUS

## 2015-01-06 MED ORDER — GABAPENTIN 400 MG PO CAPS: 400.0000 mg | ORAL_CAPSULE | Freq: Three times a day (TID) | ORAL | Status: DC

## 2015-01-06 MED ORDER — GABAPENTIN 400 MG PO CAPS
ORAL_CAPSULE | ORAL | Status: AC
  Filled 2015-01-06: qty 1

## 2015-01-06 MED ORDER — PROPOFOL 10 MG/ML IV BOLUS
INTRAVENOUS | Status: DC | PRN
  Administered 2015-01-06: 200 mg via INTRAVENOUS

## 2015-01-06 MED ORDER — CLINDAMYCIN PHOSPHATE 900 MG/50ML IV SOLN: 900.0000 mg | Freq: Once | INTRAVENOUS | Status: DC

## 2015-01-06 MED ORDER — LIDOCAINE HCL (PF) 1 % IJ SOLN
INTRAMUSCULAR | Status: AC
  Filled 2015-01-06: qty 30

## 2015-01-06 MED ORDER — BUPIVACAINE HCL 0.5 % IJ SOLN
INTRAMUSCULAR | Status: DC | PRN
  Administered 2015-01-06 (×2): 30 mL

## 2015-01-06 MED ORDER — HYDROCODONE-ACETAMINOPHEN 7.5-325 MG PO TABS: 1.0000 | ORAL_TABLET | Freq: Four times a day (QID) | ORAL | Status: DC | PRN

## 2015-01-06 MED ORDER — FENTANYL CITRATE (PF) 100 MCG/2ML IJ SOLN
INTRAMUSCULAR | Status: DC | PRN
  Administered 2015-01-06 (×2): 50 ug via INTRAVENOUS
  Administered 2015-01-06: 100 ug via INTRAVENOUS
  Administered 2015-01-06: 50 ug via INTRAVENOUS

## 2015-01-06 MED ORDER — CLINDAMYCIN PHOSPHATE 900 MG/50ML IV SOLN
INTRAVENOUS | Status: AC
  Administered 2015-01-06: 900 mg via INTRAVENOUS
  Filled 2015-01-06: qty 50

## 2015-01-06 MED ORDER — HYDROCODONE-ACETAMINOPHEN 5-325 MG PO TABS
1.0000 | ORAL_TABLET | Freq: Four times a day (QID) | ORAL | Status: DC | PRN
  Administered 2015-01-06: 1 via ORAL

## 2015-01-06 MED ORDER — ONDANSETRON HCL 4 MG/2ML IJ SOLN
INTRAMUSCULAR | Status: DC | PRN
  Administered 2015-01-06 (×2): 4 mg via INTRAVENOUS

## 2015-01-06 MED ORDER — LIDOCAINE HCL (CARDIAC) 20 MG/ML IV SOLN
INTRAVENOUS | Status: DC | PRN
  Administered 2015-01-06: 100 mg via INTRAVENOUS

## 2015-01-06 SURGICAL SUPPLY — 48 items
BANDAGE ELASTIC 4 CLIP ST LF (GAUZE/BANDAGES/DRESSINGS) ×2 IMPLANT
BANDAGE ELASTIC 4 LF NS (GAUZE/BANDAGES/DRESSINGS) ×3 IMPLANT
BANDAGE STRETCH 3X4.1 STRL (GAUZE/BANDAGES/DRESSINGS) ×3 IMPLANT
BLADE CRESCENTIC (BLADE) ×4 IMPLANT
BLADE MED AGGRESSIVE (BLADE) ×5 IMPLANT
BLADE OSC/SAGITTAL MD 5.5X18 (BLADE) ×1 IMPLANT
BLADE SURG MINI STRL (BLADE) ×5 IMPLANT
BNDG CMPR MED 5X4 ELC HKLP NS (GAUZE/BANDAGES/DRESSINGS) ×1
BNDG ESMARK 4X12 TAN STRL LF (GAUZE/BANDAGES/DRESSINGS) ×3 IMPLANT
BNDG GAUZE 4.5X4.1 6PLY STRL (MISCELLANEOUS) ×1 IMPLANT
C-wire ×3 IMPLANT
CANISTER SUCT 1200ML W/VALVE (MISCELLANEOUS) ×3 IMPLANT
CLOSURE WOUND 1/4X4 (GAUZE/BANDAGES/DRESSINGS)
COVER PIN YLW 0.028-062 (MISCELLANEOUS) ×4 IMPLANT
DRAPE FLUOR MINI C-ARM 54X84 (DRAPES) ×3 IMPLANT
DURAPREP 26ML APPLICATOR (WOUND CARE) ×5 IMPLANT
GAUZE PETRO XEROFOAM 1X8 (MISCELLANEOUS) ×3 IMPLANT
GAUZE SPONGE 4X4 12PLY STRL (GAUZE/BANDAGES/DRESSINGS) ×3 IMPLANT
GAUZE XEROFORM 4X4 STRL (GAUZE/BANDAGES/DRESSINGS) ×2 IMPLANT
GLOVE BIO SURGEON STRL SZ7.5 (GLOVE) ×5 IMPLANT
GOWN STRL REUS W/ TWL LRG LVL3 (GOWN DISPOSABLE) ×2 IMPLANT
GOWN STRL REUS W/TWL LRG LVL3 (GOWN DISPOSABLE) ×6
KIT RM TURNOVER STRD PROC AR (KITS) ×3 IMPLANT
LABEL OR SOLS (LABEL) ×3 IMPLANT
NDL FILTER BLUNT 18X1 1/2 (NEEDLE) ×1 IMPLANT
NEEDLE FILTER BLUNT 18X 1/2SAF (NEEDLE) ×2
NEEDLE FILTER BLUNT 18X1 1/2 (NEEDLE) ×1 IMPLANT
NS IRRIG 500ML POUR BTL (IV SOLUTION) ×3 IMPLANT
PACK EXTREMITY ARMC (MISCELLANEOUS) ×3 IMPLANT
PAD GROUND ADULT SPLIT (MISCELLANEOUS) ×3 IMPLANT
PAD PREP 24X41 OB/GYN DISP (PERSONAL CARE ITEMS) ×3 IMPLANT
PENCIL ELECTRO HAND CTR (MISCELLANEOUS) ×3 IMPLANT
RASP SM TEAR CROSS CUT (RASP) ×1 IMPLANT
SPONGE XRAY 4X4 16PLY STRL (MISCELLANEOUS) ×2 IMPLANT
STAPLER SKIN PROX 35W (STAPLE) ×4 IMPLANT
STOCKINETTE 48X4 2 PLY STRL (GAUZE/BANDAGES/DRESSINGS) IMPLANT
STOCKINETTE STRL 4IN 9604848 (GAUZE/BANDAGES/DRESSINGS) ×3 IMPLANT
STOCKINETTE STRL 6IN 960660 (GAUZE/BANDAGES/DRESSINGS) ×3 IMPLANT
STRIP CLOSURE SKIN 1/4X4 (GAUZE/BANDAGES/DRESSINGS) ×1 IMPLANT
SUT ETHILON 5-0 FS-2 18 BLK (SUTURE) ×3 IMPLANT
SUT MERSILENE 4-0 WHT RB-1 (SUTURE) ×2 IMPLANT
SUT VIC AB 3-0 SH 27 (SUTURE) ×9
SUT VIC AB 3-0 SH 27X BRD (SUTURE) IMPLANT
SUT VIC AB 4-0 FS2 27 (SUTURE) ×3 IMPLANT
SYRINGE 10CC LL (SYRINGE) ×3 IMPLANT
WIRE Z .045 C-WIRE SPADE TIP (WIRE) ×1 IMPLANT
WIRE Z .062 C-WIRE SPADE TIP (WIRE) ×13 IMPLANT
c-wire ×6 IMPLANT

## 2015-01-06 NOTE — OR Nursing (Signed)
A time out was performed at 08:03am for bilateral bunionectomies and bilateral 2nd hammertoe-present was K. Antonin Meininger, C. Rozell Searing, M. Noles and Jonny Ruiz. A second time out was performed at 09:43am for the beginning of the second foot which was the left foot, timeout consisted of bilateral bunionectomies and bilateral 2nd hammer toe, present K. Lyon Dumont, C. Rozell Searing, M. Noles and H. Miller---could not correct the documentation in the chart

## 2015-01-06 NOTE — Anesthesia Postprocedure Evaluation (Signed)
Anesthesia Post Note  Patient: Nicholas [name].  Procedure(s) Performed: Procedure(s) (LRB): HAMMER TOE CORRECTION (Bilateral) BUNIONECTOMY (Bilateral)  Patient location during evaluation: PACU Anesthesia Type: General Level of consciousness: awake Pain management: pain level controlled Vital Signs Assessment: post-procedure vital signs reviewed and stable Respiratory status: nonlabored ventilation Cardiovascular status: stable Anesthetic complications: no    Last Vitals:  Filed Vitals:   01/06/15 0616 01/06/15 1105  BP: 144/88 143/72  Pulse:  75  Temp: 36.8 C 36.3 C  Resp: 16 16    Last Pain:  Filed Vitals:   01/06/15 1110  PainSc: Asleep                 VAN STAVEREN,Kateryn Marasigan

## 2015-01-06 NOTE — Anesthesia Procedure Notes (Signed)
Procedure Name: LMA Insertion Date/Time: 01/06/2015 8:14 AM Performed by: Nelda Marseille Pre-anesthesia Checklist: Patient identified, Patient being monitored, Timeout performed, Emergency Drugs available and Suction available Patient Re-evaluated:Patient Re-evaluated prior to inductionOxygen Delivery Method: Circle system utilized Preoxygenation: Pre-oxygenation with 100% oxygen Intubation Type: IV induction Ventilation: Mask ventilation without difficulty LMA: LMA inserted LMA Size: 4.5 Tube type: Oral Number of attempts: 1 Placement Confirmation: positive ETCO2 and breath sounds checked- equal and bilateral Tube secured with: Tape Dental Injury: Teeth and Oropharynx as per pre-operative assessment

## 2015-01-06 NOTE — Discharge Instructions (Signed)

## 2015-01-06 NOTE — H&P (Signed)
THE PATIENT WAS SEEN IN THE HOLDING AREA.  HISTORY, ALLERGIES, HOME MEDICATIONS AND OPERATIVE PROCEDURE WERE REVIEWED. RISKS AND BENEFITS OF SURGERY DISCUSSED WITH PATIENT AGAIN.  NO CHANGES FROM INITIAL HISTORY AND PHYSICAL NOTED.    

## 2015-01-06 NOTE — Anesthesia Preprocedure Evaluation (Signed)
Anesthesia Evaluation  Patient identified by MRN, date of birth, ID band Patient awake    Reviewed: Allergy & Precautions, NPO status , Patient's Chart, lab work & pertinent test results  Airway Mallampati: II       Dental  (+) Teeth Intact   Pulmonary neg pulmonary ROS, sleep apnea ,    breath sounds clear to auscultation       Cardiovascular Exercise Tolerance: Good hypertension, + dysrhythmias  Rhythm:Regular     Neuro/Psych    GI/Hepatic Neg liver ROS, GERD  ,  Endo/Other    Renal/GU negative Renal ROS     Musculoskeletal   Abdominal (+) + obese,   Peds  Hematology   Anesthesia Other Findings   Reproductive/Obstetrics                             Anesthesia Physical Anesthesia Plan  ASA: III  Anesthesia Plan: General and Spinal   Post-op Pain Management:    Induction: Intravenous  Airway Management Planned: LMA and Nasal Cannula  Additional Equipment:   Intra-op Plan:   Post-operative Plan:   Informed Consent: I have reviewed the patients History and Physical, chart, labs and discussed the procedure including the risks, benefits and alternatives for the proposed anesthesia with the patient or authorized representative who has indicated his/her understanding and acceptance.     Plan Discussed with: CRNA  Anesthesia Plan Comments:         Anesthesia Quick Evaluation

## 2015-01-06 NOTE — Op Note (Addendum)
ORDATE@  11:33 AM  PATIENT:  Nicholas [name].    PRE-OPERATIVE DIAGNOSIS:  SEVERE BILATERAL BUNIONS                                                      BILATERAL  ACQUIRED SECOND HAMMER TOE OTHER THAN GREAT TOE  POST-OPERATIVE DIAGNOSIS:  Same  PROCEDURE:  BILATERAL BUNIONECTOMY WITH PROXIMAL METATARSAL OSTEOTOMY AND DISTAL SOFT TISSUE RELEASE                            BILATERAL SECOND HAMMER TOE CORRECTION WITH EXTENSOR TENDON LENGTHENING/CAPSULAR RELEASE  SURGEON:  Park Breed, MD   ANESTHESIA:   General  PREOPERATIVE INDICATIONS:  Nicholas [name]. is a  69 y.o. male with a diagnosis of SEVERE BILATERAL BUNIONS, HALLUX VALGUS, AND ACQUIRED SECOND  HAMMER TOE OTHER THAN GREAT TOE who failed conservative measures and elected for surgical management.    The risks benefits and alternatives were discussed with the patient preoperatively including but not limited to the risks of infection, bleeding, nerve injury, cardiopulmonary complications, the need for revision surgery, among others, and the patient was willing to proceed.  OPERATIVE IMPLANTS: 6  0.62 K wires  OPERATIVE FINDINGS: Severe bilateral bunion deformities with increased first intermetatarsal angles, severe hallux valgus, large bunions, and second toe hammer deformities  EBL: Minimal  COMPLICATIONS:   None  OPERATIVE PROCEDURE: Patient was brought to the operating room where he underwent satisfactory general LMA anesthesia in the supine position.  Both feet were prepped and draped sterilely to an area above the ankle.  IV antibiotics were administered.  The right foot was approached first.  An Esmarch bandage was use to exsanguinate the foot.  Tourniquet was inflated to 250 mmHg above the ankle.  Tourniquet time was 93 minutes.  A longitudinal 2 drill incision was made in the first dorsal interspace between the metatarsal heads and dissection carried out bluntly through subcutaneous tissue.  The abductor hallucis was  released from the proximal phalanx.  The intermetatarsal ligament was released.  The extensor to the second toe was incised in a Z fashion for later repair.  The dorsal capsule of the MP joint of the second toe was released.  The PIP joint was forcibly hyperextended.  A medial incision was then made over the first MP joint and dissection carried out bluntly through subcutaneous tissue, protecting the nerve dorsally.  The medial capsule was incised vertically and the capsule dissected off of the bone.  Oscillating saw was used to relieve the prominent metatarsal medially.  The lateral capsule of the first MP joint was incised and the great toe manually corrected back to a neutral position.  A third incision was then made dorsal medially at the base of the first metatarsal.  Dissection was carried out down to bone with electrocautery being used on veins.  The extensor was retracted and the base of the first metatarsal was exposed circumferentially.  A semicircular saw blade was then used an oscillating fashion to create an osteotomy near the base of the metatarsal with the convexity facing distally.  The metatarsal was then maneuvered to a position more parallel to the second metatarsal.  This was pinned in place with a 0.062 wire.  The MP joint of the great  toe was pinned in neutral.  The PIP joint of the second toe was pinned in extension.  Fluoroscopy showed the osteotomy to be in good position with good correction of the MP deformity.  The medial capsule of the great toe MP joint was then repaired with 4-0 Mersilene suture.  The extensor to the second toe was resutured in a lengthened position.  All wounds were irrigated.  The pins were cut and bent and buried.  The wounds were then closed with staples.  Half percent Marcaine was placed into all wounds.  A dry sterile compression dressing was applied.  Tourniquet was deflated with good return of blood flow to all toes.Following this, the left foot was approached  in a similar fashion.  The procedure was carried out with fluoroscopy showing excellent correction of the bunion deformity and the osteotomy of the first metatarsal being in good alignment.  After Marcaine was placed in all wounds and dry sterile dressing applied again.  The fins were all bent and buried.  Tourniquet was deflated with good return of blood flow to the foot.  Tourniquet time was 51 minutes on the left.  Patient was then awakened and taken recovery in satisfactory position.  Park Breed, MD

## 2015-01-06 NOTE — Transfer of Care (Signed)
Immediate Anesthesia Transfer of Care Note  Patient: Nicholas [name].  Procedure(s) Performed: Procedure(s): HAMMER TOE CORRECTION (Bilateral) BUNIONECTOMY (Bilateral)  Patient Location: PACU  Anesthesia Type:General  Level of Consciousness: sedated  Airway & Oxygen Therapy: Patient Spontanous Breathing and Patient connected to face mask oxygen  Post-op Assessment: Report given to RN and Post -op Vital signs reviewed and stable  Post vital signs: Reviewed and stable  Last Vitals:  Filed Vitals:   01/06/15 0616  BP: 144/88  Temp: 36.8 C  Resp: 16    Complications: No apparent anesthesia complications

## 2015-01-06 NOTE — Op Note (Signed)
ORDATE@  11:36 AM  PATIENT:  Nicholas [name].    PRE-OPERATIVE DIAGNOSIS:  SEVERE BILATERAL BUNIONS                                                      BILATERAL  ACQUIRED SECOND HAMMER TOE OTHER THAN GREAT TOE  POST-OPERATIVE DIAGNOSIS:  Same  PROCEDURE:  BILATERAL BUNIONECTOMY WITH PROXIMAL METATARSAL OSTEOTOMY AND DISTAL SOFT TISSUE RELEASE                            BILATERAL SECOND HAMMER TOE CORRECTION WITH EXTENSOR TENDON LENGTHENING/CAPSULAR RELEASE  SURGEON:  Park Breed, MD   ANESTHESIA:   General  PREOPERATIVE INDICATIONS:  Nicholas Huebsch. is a  69 y.o. male with a diagnosis of SEVERE BILATERAL BUNIONS, HALLUX VALGUS, AND ACQUIRED SECOND  HAMMER TOE OTHER THAN GREAT TOE who failed conservative measures and elected for surgical management.    The risks benefits and alternatives were discussed with the patient preoperatively including but not limited to the risks of infection, bleeding, nerve injury, cardiopulmonary complications, the need for revision surgery, among others, and the patient was willing to proceed.  OPERATIVE IMPLANTS: 6  0.62 K wires  OPERATIVE FINDINGS: Severe bilateral bunion deformities with increased first intermetatarsal angles, severe hallux valgus, large bunions, and second toe hammer deformities  EBL: Minimal  COMPLICATIONS:   None  OPERATIVE PROCEDURE: Patient was brought to the operating room where he underwent satisfactory general LMA anesthesia in the supine position.  Both feet were prepped and draped sterilely to an area above the ankle.  IV antibiotics were administered.  The right foot was approached first.  An Esmarch bandage was use to exsanguinate the foot.  Tourniquet was inflated to 250 mmHg above the ankle.  Tourniquet time was 93 minutes.  A longitudinal 2 drill incision was made in the first dorsal interspace between the metatarsal heads and dissection carried out bluntly through subcutaneous tissue.  The abductor hallucis was  released from the proximal phalanx.  The intermetatarsal ligament was released.  The extensor to the second toe was incised in a Z fashion for later repair.  The dorsal capsule of the MP joint of the second toe was released.  The PIP joint was forcibly hyperextended.  A medial incision was then made over the first MP joint and dissection carried out bluntly through subcutaneous tissue, protecting the nerve dorsally.  The medial capsule was incised vertically and the capsule dissected off of the bone.  Oscillating saw was used to relieve the prominent metatarsal medially.  The lateral capsule of the first MP joint was incised and the great toe manually corrected back to a neutral position.  A third incision was then made dorsal medially at the base of the first metatarsal.  Dissection was carried out down to bone with electrocautery being used on veins.  The extensor was retracted and the base of the first metatarsal was exposed circumferentially.  A semicircular saw blade was then used an oscillating fashion to create an osteotomy near the base of the metatarsal with the convexity facing distally.  The metatarsal was then maneuvered to a position more parallel to the second metatarsal.  This was pinned in place with a 0.062 wire.  The MP joint of the great  toe was pinned in neutral.  The PIP joint of the second toe was pinned in extension.  Fluoroscopy showed the osteotomy to be in good position with good correction of the MP deformity.  The medial capsule of the great toe MP joint was then repaired with 4-0 Mersilene suture.  The extensor to the second toe was resutured in a lengthened position.  All wounds were irrigated.  The pins were cut and bent and buried.  The wounds were then closed with staples.  Half percent Marcaine was placed into all wounds.  A dry sterile compression dressing was applied.  Tourniquet was deflated with good return of blood flow to all toes.Following this, the left foot was approached  in a similar fashion.  The procedure was carried out with fluoroscopy showing excellent correction of the bunion deformity and the osteotomy of the first metatarsal being in good alignment.  After Marcaine was placed in all wounds and dry sterile dressing applied again.  The fins were all bent and buried.  Tourniquet was deflated with good return of blood flow to the foot.  Tourniquet time was 51 minutes on the left.  Patient was then awakened and taken recovery in satisfactory position.  Park Breed, MD

## 2015-01-07 ENCOUNTER — Encounter: Payer: Self-pay | Admitting: Specialist

## 2015-01-12 ENCOUNTER — Ambulatory Visit: Payer: BLUE CROSS/BLUE SHIELD | Admitting: Family Medicine

## 2015-01-24 HISTORY — PX: PACEMAKER IMPLANT: EP1218

## 2015-01-26 DIAGNOSIS — M21612 Bunion of left foot: Secondary | ICD-10-CM | POA: Diagnosis not present

## 2015-01-26 DIAGNOSIS — M2042 Other hammer toe(s) (acquired), left foot: Secondary | ICD-10-CM | POA: Diagnosis not present

## 2015-01-26 DIAGNOSIS — M2041 Other hammer toe(s) (acquired), right foot: Secondary | ICD-10-CM | POA: Diagnosis not present

## 2015-01-26 DIAGNOSIS — M21611 Bunion of right foot: Secondary | ICD-10-CM | POA: Diagnosis not present

## 2015-01-27 ENCOUNTER — Encounter: Payer: Self-pay | Admitting: Family Medicine

## 2015-01-27 ENCOUNTER — Ambulatory Visit (INDEPENDENT_AMBULATORY_CARE_PROVIDER_SITE_OTHER): Payer: BLUE CROSS/BLUE SHIELD | Admitting: Family Medicine

## 2015-01-27 VITALS — BP 126/79 | HR 60 | Temp 98.8°F

## 2015-01-27 DIAGNOSIS — I10 Essential (primary) hypertension: Secondary | ICD-10-CM

## 2015-01-27 DIAGNOSIS — F32A Depression, unspecified: Secondary | ICD-10-CM

## 2015-01-27 DIAGNOSIS — F329 Major depressive disorder, single episode, unspecified: Secondary | ICD-10-CM | POA: Diagnosis not present

## 2015-01-27 NOTE — Progress Notes (Signed)
   BP 126/79 mmHg  Pulse 60  Temp(Src) 98.8 F (37.1 C)  Ht   Wt   SpO2 97%   Subjective:    Patient ID: Nicholas Canterbury., male    DOB: 1945/09/18, 70 y.o.   MRN: [id number]  HPI: Nicholas Altimus. is a 70 y.o. male  Chief Complaint  Patient presents with  . Depression   patient here accompanied by his wife has done well for bilateral bunion and hammertoe surgery is an bilateral boots is using a walker and is gotten through the most acute part of his surgery.  Patient's nerves with switch from Trintellix to Effexor XR has gone well Transition went smoothly patient's depression is markedly improved noticed both by wife and me.   Relevant past medical, surgical, family and social history reviewed and updated as indicated. Interim medical history since our last visit reviewed. Allergies and medications reviewed and updated.  Review of Systems  Constitutional: Negative.   Respiratory: Negative.   Cardiovascular: Negative.   Psychiatric/Behavioral: Negative.     Per HPI unless specifically indicated above     Objective:    BP 126/79 mmHg  Pulse 60  Temp(Src) 98.8 F (37.1 C)  Ht   Wt   SpO2 97%  Wt Readings from Last 3 Encounters:  12/28/14 294 lb (133.358 kg)  12/15/14 291 lb (131.997 kg)  12/01/14 293 lb (132.904 kg)    Physical Exam  Constitutional: He is oriented to person, place, and time. He appears well-developed and well-nourished. No distress.  HENT:  Head: Normocephalic and atraumatic.  Right Ear: Hearing normal.  Left Ear: Hearing normal.  Nose: Nose normal.  Eyes: Conjunctivae and lids are normal. Right eye exhibits no discharge. Left eye exhibits no discharge. No scleral icterus.  Cardiovascular: Normal rate, regular rhythm and normal heart sounds.   Pulmonary/Chest: Effort normal and breath sounds normal. No respiratory distress.  Musculoskeletal: Normal range of motion.  Neurological: He is alert and oriented to person, place, and time.  Skin:  Skin is intact. No rash noted.  Psychiatric: He has a normal mood and affect. His speech is normal and behavior is normal. Judgment and thought content normal. Cognition and memory are normal.    Results for orders placed or performed during the hospital encounter of 12/28/14  CBC  Result Value Ref Range   WBC 4.7 3.8 - 10.6 K/uL   RBC 4.99 4.40 - 5.90 MIL/uL   Hemoglobin 15.6 13.0 - 18.0 g/dL   HCT 46.3 40.0 - 52.0 %   MCV 92.9 80.0 - 100.0 fL   MCH 31.2 26.0 - 34.0 pg   MCHC 33.6 32.0 - 36.0 g/dL   RDW 13.3 11.5 - 14.5 %   Platelets 136 (L) 150 - 440 K/uL      Assessment & Plan:   Problem List Items Addressed This Visit      Cardiovascular and Mediastinum   HTN (hypertension) - Primary (Chronic)    The current medical regimen is effective;  continue present plan and medications.         Other   Depression    The current medical regimen is effective;  continue present plan and medications.           Follow up plan: Return in about 3 months (around 04/27/2015), or if symptoms worsen or fail to improve, for Physical Exam.

## 2015-01-27 NOTE — Assessment & Plan Note (Signed)
The current medical regimen is effective;  continue present plan and medications.  

## 2015-01-29 ENCOUNTER — Ambulatory Visit: Payer: Self-pay | Admitting: Urology

## 2015-02-15 DIAGNOSIS — M21611 Bunion of right foot: Secondary | ICD-10-CM | POA: Diagnosis not present

## 2015-02-15 DIAGNOSIS — M2041 Other hammer toe(s) (acquired), right foot: Secondary | ICD-10-CM | POA: Diagnosis not present

## 2015-02-15 DIAGNOSIS — M21612 Bunion of left foot: Secondary | ICD-10-CM | POA: Diagnosis not present

## 2015-02-22 ENCOUNTER — Other Ambulatory Visit: Payer: Self-pay | Admitting: Family Medicine

## 2015-02-22 MED ORDER — HYDROCODONE-ACETAMINOPHEN 5-325 MG PO TABS: 1.0000 | ORAL_TABLET | Freq: Two times a day (BID) | ORAL | Status: DC | PRN

## 2015-02-26 ENCOUNTER — Ambulatory Visit (INDEPENDENT_AMBULATORY_CARE_PROVIDER_SITE_OTHER): Payer: BLUE CROSS/BLUE SHIELD | Admitting: Urology

## 2015-02-26 ENCOUNTER — Encounter: Payer: Self-pay | Admitting: Urology

## 2015-02-26 VITALS — BP 115/75 | HR 108 | Ht 71.0 in | Wt 293.0 lb

## 2015-02-26 DIAGNOSIS — N359 Urethral stricture, unspecified: Secondary | ICD-10-CM

## 2015-02-26 DIAGNOSIS — N480 Leukoplakia of penis: Secondary | ICD-10-CM

## 2015-02-27 ENCOUNTER — Encounter: Payer: Self-pay | Admitting: Urology

## 2015-02-27 NOTE — Progress Notes (Signed)
02/26/2015 1:11 PM   Nicholas [name] 03-28-1945 [id number]  Referring provider: [name], MD [address] [place], [place] [address]  Chief Complaint  Patient presents with  . 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  That time, read showed multiple tight concentric urethral strictures with the most significant in the bulbar urethra but also involving the penile urethra.    He returns today for routine follow up.  He was unable to provide urine today or perform uroflow.  Overall, he reports he is doing quite well. He has no voiding complaints. He reports that since his DVIU, his urinary stream has been excellent and all of his urinary symptoms have resolved. He has not had a urinary tract infection since that time as well.  He is able to empty his bladder completely without urinary frequency or urgency. No dysuria or gross hematuria.  He's had multiple medical issues over the past year including DVT, PE, and bilateral knee surgeries.      IPSS      02/27/15 1200       International Prostate Symptom Score   How often have you had the sensation of not emptying your bladder? Not at All     How often have you had to urinate less than every two hours? Less than 1 in 5 times     How often have you found you stopped and started again several times when you urinated? Not at All     How often have you found it difficult to postpone urination? Not at All     How often have you had a weak urinary stream? Not at All     How often have you had to strain to start urination? Not at All     How many times did you typically get up at night to urinate? 1 Time     Total IPSS Score 2     Quality of Life due to urinary symptoms   If you were to spend the rest of your life with your urinary condition just the way it is now how would you feel about that? Delighted         Score:  1-7 Mild 8-19 Moderate 20-35 Severe  PMH: Past Medical History  Diagnosis Date  . High cholesterol   . Arthritis     a. knees, hips, hands;  b. 11/2013 s/p L TKA @ [place].  . Bulging lumbar disc   . Anxiety   . Depression   . First degree AV block   . Mobitz type I Wenckebach atrioventricular block   . Hypertension     borderline  . History of blood clots   . Chronic kidney disease   . Shortness of breath dyspnea   . Gross hematuria   . Anterior urethral stricture   . BXO (balanitis xerotica obliterans)   . Dysuria   . Phimosis     Surgical History: Past Surgical History  Procedure Laterality Date  . Total hip arthroplasty Right 2004  . Total hip arthroplasty Left 2006  . Knee cartilage surgery Left 1965    "football injury"  . Knee arthroscopy Right 2013  . Cataract extraction w/ intraocular lens  implant, bilateral Bilateral ~ 2010  . Pilonidal cyst excision  1970's  . Cardiac catheterization  ~ 2005    "once"  .  Left total knee arthroplasty      a. 11/2013 [facility].  . Joint replacement      hips and knees  . Hammer toe surgery Bilateral 01/06/2015    Procedure: HAMMER TOE CORRECTION;  Surgeon: [name], MD;  Location: [facility] ORS;  Service: Orthopedics;  Laterality: Bilateral;  . Bunionectomy Bilateral 01/06/2015    Procedure: BUNIONECTOMY;  Surgeon: [name], MD;  Location: [facility] ORS;  Service: Orthopedics;  Laterality: Bilateral;    Home Medications:    Medication List       This list is accurate as of: 02/26/15 11:59 PM.  Always use your most recent med list.               apixaban 5 MG Tabs tablet  Commonly known as:  ELIQUIS  Take 1 tablet (5 mg total) by mouth 2 (two) times daily.     B-12 1000 MCG Caps  Take by mouth daily.     ferrous sulfate 325 (65 FE) MG tablet  Take 325 mg by mouth daily with breakfast.     FISH OIL + D3 PO  Take 2 tablets by mouth daily.     gabapentin 400 MG capsule  Commonly known as:  NEURONTIN   Take 1 capsule (400 mg total) by mouth 3 (three) times daily.     HYDROcodone-acetaminophen 5-325 MG tablet  Commonly known as:  NORCO/VICODIN  Take 1 tablet by mouth 2 (two) times daily as needed.     LORazepam 1 MG tablet  Commonly known as:  ATIVAN  Take 1 tablet (1 mg total) by mouth at bedtime.     multivitamin tablet  Take 1 tablet by mouth daily.     oxymetazoline 0.05 % nasal spray  Commonly known as:  AFRIN  Place 2 sprays into both nostrils at bedtime as needed for congestion.     QUEtiapine Fumarate 150 MG 24 hr tablet  Commonly known as:  SEROQUEL XR  Take 1 tablet (150 mg total) by mouth at bedtime.     RA CRANBERRY SUPPLEMENTS PO  Take by mouth daily.     SALINE MIST SPRAY NA  Place 2 sprays into both nostrils at bedtime.     simvastatin 20 MG tablet  Commonly known as:  ZOCOR  Take 1 tablet (20 mg total) by mouth daily.     solifenacin 10 MG tablet  Commonly known as:  VESICARE  Take 1 tablet (10 mg total) by mouth daily.     venlafaxine XR 75 MG 24 hr capsule  Commonly known as:  EFFEXOR XR  Take 1 capsule (75 mg total) by mouth daily with breakfast. 1 capsule a day for 1 week then 2 a day for 1 week then 3 a day     vitamin C 250 MG tablet  Commonly known as:  ASCORBIC ACID  Take 250 mg by mouth daily.        Allergies: No Known Allergies  Family History: Family History  Problem Relation Age of Onset  . Stroke Father     deceased  . Diabetes Father   . Hypertension Father   . Alcoholism Mother     died in her 11's.  . Cancer Mother   . Cancer Sister   . Prostate cancer Paternal Grandfather     Social History:  reports that he has never smoked. He has never used smokeless tobacco. He reports that he drinks about 3.6 oz of alcohol per week. He reports that he  does not use illicit drugs.  ROS: UROLOGY Frequent Urination?: No Hard to postpone urination?: No Burning/pain with urination?: No Get up at night to urinate?: No Leakage of  urine?: No Urine stream starts and stops?: No Trouble starting stream?: No Do you have to strain to urinate?: No Blood in urine?: No Urinary tract infection?: No Sexually transmitted disease?: No Injury to kidneys or bladder?: No Painful intercourse?: No Weak stream?: No Erection problems?: No Penile pain?: No  Gastrointestinal Nausea?: No Vomiting?: No Indigestion/heartburn?: No Diarrhea?: No Constipation?: No  Constitutional Fever: No Night sweats?: No Weight loss?: No Fatigue?: No  Skin Skin rash/lesions?: No Itching?: No  Eyes Blurred vision?: No Double vision?: No  Ears/Nose/Throat Sore throat?: No Sinus problems?: No  Hematologic/Lymphatic Swollen glands?: No Easy bruising?: Yes  Cardiovascular Leg swelling?: No Chest pain?: No  Respiratory Cough?: No  Endocrine Excessive thirst?: No  Musculoskeletal Back pain?: No Joint pain?: Yes  Neurological Headaches?: No Dizziness?: No  Psychologic Depression?: No Anxiety?: No  Physical Exam: BP 115/75 mmHg  Pulse 108  Ht 5\' 11"  (1.803 m)  Wt 293 lb (132.904 kg)  BMI 40.88 kg/m2  Constitutional:  Alert and oriented, No acute distress. HEENT: [place] AT, moist mucus membranes.  Trachea midline, no masses. Cardiovascular: No clubbing, cyanosis, or edema. Respiratory: Normal respiratory effort, no increased work of breathing. GI: Abdomen is soft, nontender, nondistended, no abdominal masses GU: No CVA tenderness. Circumcised somewhat buried phallus with mild coronal adhesions which were reducible today. Widely patent urethral meatus.   Skin: No rashes, bruises or suspicious lesions. Neurologic: Grossly intact, no focal deficits, moving all 4 extremities.  Ambulating with walker. Psychiatric: Normal mood and affect.  Laboratory Data: Lab Results  Component Value Date   WBC 4.7 12/28/2014   HGB 15.6 12/28/2014   HCT 46.3 12/28/2014   MCV 92.9 12/28/2014   PLT 136* 12/28/2014    Lab Results    Component Value Date   CREATININE 0.95 10/20/2014    Pertinent Imaging: n/a  Assessment & Plan:    1. Anterior urethral stricture, unspecified stricture type Voiding well, asymptomatic.  Unable to provide UA/ Uroflow but asymptomatic.   Discussed that BXO stricture high risk of recurrent. Advised to follow up as needed with progression of symptoms.  2. BXO (balanitis xerotica obliterans) S/p circumcision Discussed hygiene issues and and management of adhesion   Hollice Espy, MD  [facility] [address] [place], [place] [address] [phone]

## 2015-03-02 ENCOUNTER — Telehealth: Payer: Self-pay

## 2015-03-02 NOTE — Telephone Encounter (Signed)
Requesting 90 day Rx for Venlafaxine HCL ER 75MG  Cap  [facility]

## 2015-03-03 MED ORDER — VENLAFAXINE HCL ER 75 MG PO CP24: 75.0000 mg | ORAL_CAPSULE | Freq: Every day | ORAL | Status: DC

## 2015-03-16 DIAGNOSIS — M21612 Bunion of left foot: Secondary | ICD-10-CM | POA: Diagnosis not present

## 2015-03-16 DIAGNOSIS — M2041 Other hammer toe(s) (acquired), right foot: Secondary | ICD-10-CM | POA: Diagnosis not present

## 2015-03-16 DIAGNOSIS — M21611 Bunion of right foot: Secondary | ICD-10-CM | POA: Diagnosis not present

## 2015-03-18 ENCOUNTER — Other Ambulatory Visit: Payer: Self-pay | Admitting: Family Medicine

## 2015-03-18 MED ORDER — HYDROCODONE-ACETAMINOPHEN 5-325 MG PO TABS: 1.0000 | ORAL_TABLET | Freq: Two times a day (BID) | ORAL | Status: DC | PRN

## 2015-04-19 ENCOUNTER — Other Ambulatory Visit: Payer: Self-pay | Admitting: Family Medicine

## 2015-04-19 MED ORDER — HYDROCODONE-ACETAMINOPHEN 5-325 MG PO TABS: 1.0000 | ORAL_TABLET | Freq: Two times a day (BID) | ORAL | Status: DC | PRN

## 2015-04-26 ENCOUNTER — Telehealth: Payer: Self-pay | Admitting: Family Medicine

## 2015-04-26 DIAGNOSIS — R300 Dysuria: Secondary | ICD-10-CM

## 2015-04-26 NOTE — Telephone Encounter (Signed)
Pt will be here tomorrow at 0800

## 2015-04-26 NOTE — Telephone Encounter (Signed)
Pt feels he has a bladder infection, no appts available until later in the week.  Wants to know if he can bring in a sample.  Please call to advise.

## 2015-04-26 NOTE — Telephone Encounter (Signed)
bringing in urine specimen please

## 2015-04-27 ENCOUNTER — Other Ambulatory Visit: Payer: BLUE CROSS/BLUE SHIELD

## 2015-04-27 DIAGNOSIS — M21612 Bunion of left foot: Secondary | ICD-10-CM | POA: Diagnosis not present

## 2015-04-27 DIAGNOSIS — M21611 Bunion of right foot: Secondary | ICD-10-CM | POA: Diagnosis not present

## 2015-04-27 DIAGNOSIS — M5136 Other intervertebral disc degeneration, lumbar region: Secondary | ICD-10-CM | POA: Diagnosis not present

## 2015-04-27 DIAGNOSIS — M9905 Segmental and somatic dysfunction of pelvic region: Secondary | ICD-10-CM | POA: Diagnosis not present

## 2015-04-27 DIAGNOSIS — R300 Dysuria: Secondary | ICD-10-CM

## 2015-04-27 DIAGNOSIS — M2041 Other hammer toe(s) (acquired), right foot: Secondary | ICD-10-CM | POA: Diagnosis not present

## 2015-04-27 DIAGNOSIS — M9903 Segmental and somatic dysfunction of lumbar region: Secondary | ICD-10-CM | POA: Diagnosis not present

## 2015-04-27 DIAGNOSIS — M955 Acquired deformity of pelvis: Secondary | ICD-10-CM | POA: Diagnosis not present

## 2015-04-27 LAB — URINALYSIS, ROUTINE W REFLEX MICROSCOPIC
Bilirubin, UA: NEGATIVE
GLUCOSE, UA: NEGATIVE
Ketones, UA: NEGATIVE
NITRITE UA: NEGATIVE
Protein, UA: NEGATIVE
RBC, UA: NEGATIVE
Specific Gravity, UA: 1.02 (ref 1.005–1.030)
UUROB: 0.2 mg/dL (ref 0.2–1.0)
pH, UA: 6 (ref 5.0–7.5)

## 2015-04-27 LAB — MICROSCOPIC EXAMINATION

## 2015-04-30 ENCOUNTER — Emergency Department: Payer: BLUE CROSS/BLUE SHIELD

## 2015-04-30 ENCOUNTER — Encounter: Payer: Self-pay | Admitting: *Deleted

## 2015-04-30 ENCOUNTER — Emergency Department
Admission: EM | Admit: 2015-04-30 | Discharge: 2015-04-30 | Disposition: A | Discharge status: Home / Self Care | Payer: BLUE CROSS/BLUE SHIELD | Attending: Emergency Medicine | Admitting: Emergency Medicine

## 2015-04-30 DIAGNOSIS — E785 Hyperlipidemia, unspecified: Secondary | ICD-10-CM | POA: Insufficient documentation

## 2015-04-30 DIAGNOSIS — R1031 Right lower quadrant pain: Secondary | ICD-10-CM | POA: Diagnosis not present

## 2015-04-30 DIAGNOSIS — I129 Hypertensive chronic kidney disease with stage 1 through stage 4 chronic kidney disease, or unspecified chronic kidney disease: Secondary | ICD-10-CM | POA: Insufficient documentation

## 2015-04-30 DIAGNOSIS — N189 Chronic kidney disease, unspecified: Secondary | ICD-10-CM | POA: Diagnosis not present

## 2015-04-30 DIAGNOSIS — F329 Major depressive disorder, single episode, unspecified: Secondary | ICD-10-CM | POA: Diagnosis not present

## 2015-04-30 DIAGNOSIS — Z96649 Presence of unspecified artificial hip joint: Secondary | ICD-10-CM | POA: Insufficient documentation

## 2015-04-30 DIAGNOSIS — I441 Atrioventricular block, second degree: Secondary | ICD-10-CM | POA: Insufficient documentation

## 2015-04-30 DIAGNOSIS — Z79899 Other long term (current) drug therapy: Secondary | ICD-10-CM | POA: Insufficient documentation

## 2015-04-30 LAB — CBC WITH DIFFERENTIAL/PLATELET
BASOS PCT: 1 %
Basophils Absolute: 0 10*3/uL (ref 0–0.1)
EOS ABS: 0.2 10*3/uL (ref 0–0.7)
Eosinophils Relative: 4 %
HCT: 44.4 % (ref 40.0–52.0)
Hemoglobin: 15.2 g/dL (ref 13.0–18.0)
Lymphocytes Relative: 21 %
Lymphs Abs: 1 10*3/uL (ref 1.0–3.6)
MCH: 30.6 pg (ref 26.0–34.0)
MCHC: 34.1 g/dL (ref 32.0–36.0)
MCV: 89.6 fL (ref 80.0–100.0)
MONOS PCT: 9 %
Monocytes Absolute: 0.4 10*3/uL (ref 0.2–1.0)
NEUTROS ABS: 3 10*3/uL (ref 1.4–6.5)
NEUTROS PCT: 65 %
PLATELETS: 154 10*3/uL (ref 150–440)
RBC: 4.96 MIL/uL (ref 4.40–5.90)
RDW: 14.4 % (ref 11.5–14.5)
WBC: 4.6 10*3/uL (ref 3.8–10.6)

## 2015-04-30 LAB — COMPREHENSIVE METABOLIC PANEL
ALT: 19 U/L (ref 17–63)
ANION GAP: 5 (ref 5–15)
AST: 30 U/L (ref 15–41)
Albumin: 4 g/dL (ref 3.5–5.0)
Alkaline Phosphatase: 55 U/L (ref 38–126)
BUN: 17 mg/dL (ref 6–20)
CHLORIDE: 108 mmol/L (ref 101–111)
CO2: 23 mmol/L (ref 22–32)
Calcium: 9 mg/dL (ref 8.9–10.3)
Creatinine, Ser: 0.89 mg/dL (ref 0.61–1.24)
GFR calc non Af Amer: >60 mL/min (ref >60)
Glucose, Bld: 150 mg/dL — ABNORMAL HIGH (ref 65–99)
Potassium: 3.8 mmol/L (ref 3.5–5.1)
SODIUM: 136 mmol/L (ref 135–145)
Total Bilirubin: 1 mg/dL (ref 0.3–1.2)
Total Protein: 7.1 g/dL (ref 6.5–8.1)

## 2015-04-30 LAB — LIPASE, BLOOD: Lipase: 32 U/L (ref 11–51)

## 2015-04-30 MED ORDER — DIATRIZOATE MEGLUMINE & SODIUM 66-10 % PO SOLN
15.0000 mL | Freq: Once | ORAL | Status: AC
  Administered 2015-04-30: 15 mL via ORAL

## 2015-04-30 MED ORDER — SODIUM CHLORIDE 0.9 % IV BOLUS (SEPSIS)
1000.0000 mL | Freq: Once | INTRAVENOUS | Status: AC
  Administered 2015-04-30: 1000 mL via INTRAVENOUS

## 2015-04-30 MED ORDER — IOPAMIDOL (ISOVUE-300) INJECTION 61%
100.0000 mL | Freq: Once | INTRAVENOUS | Status: AC | PRN
  Administered 2015-04-30: 100 mL via INTRAVENOUS

## 2015-04-30 NOTE — ED Notes (Signed)
Pt arrives with RLQ abd pain for the past 3 weeks, states increasing this week, states he had a urine checked by his PCP and was told it was okay, last BM 4/7, states he started working out recently which is a new activity, states pain increased when trying to get out of the chair or bed, denies any nausea or vomiting

## 2015-04-30 NOTE — ED Provider Notes (Signed)
[facility] Emergency Department Provider Note  ____________________________________________  Time seen: Approximately [id number] AM  I have reviewed the triage vital signs and the nursing notes.   HISTORY  Chief Complaint Abdominal Pain    HPI Nicholas [name]. is a 70 y.o. male who is presenting to the emergency department today with right lower quadrant abdominal pain over the past 3 weeks. He says it has been worsening over the past 2 weeks and is especially sharp with movements. He describes it as constant and cramping but as stated above worsen when he moves. He says he has recently been going to gym but has not been doing situps. Says he mostly does upper body and back exercises. Denies any nausea vomiting or diarrhea. Denies any fever. Was sent over from the [place] clinic today for further evaluation.Denies any dysuria and says that he recently had a urinalysis done at his primary care doctor's office which was unremarkable. Denies any radiation of the pain or pain in his testicles or penis.   Past Medical History  Diagnosis Date  . High cholesterol   . Arthritis     a. knees, hips, hands;  b. 11/2013 s/p L TKA @ [place].  . Bulging lumbar disc   . Anxiety   . Depression   . First degree AV block   . Mobitz type I Wenckebach atrioventricular block   . Hypertension     borderline  . History of blood clots   . Chronic kidney disease   . Shortness of breath dyspnea   . Gross hematuria   . Anterior urethral stricture   . BXO (balanitis xerotica obliterans)   . Dysuria   . Phimosis     Patient Active Problem List   Diagnosis Date Noted  . Depression 10/20/2014  . Onychomycosis 10/20/2014  . Anxiety 10/20/2014  . 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 HTN (hypertension) 04/17/2013  . Dyslipidemia 04/17/2013  . GERD (gastroesophageal reflux disease) 04/17/2013  . Pulmonary embolism ([place]) 04/17/2013    Past Surgical  History  Procedure Laterality Date  . Total hip arthroplasty Right 2004  . Total hip arthroplasty Left 2006  . Knee cartilage surgery Left 1965    "football injury"  . Knee arthroscopy Right 2013  . Cataract extraction w/ intraocular lens  implant, bilateral Bilateral ~ 2010  . Pilonidal cyst excision  1970's  . Cardiac catheterization  ~ 2005    "once"  . Left total knee arthroplasty      a. 11/2013 [facility].  . Joint replacement      hips and knees  . Hammer toe surgery Bilateral 01/06/2015    Procedure: HAMMER TOE CORRECTION;  Surgeon: [name], MD;  Location: [facility] ORS;  Service: Orthopedics;  Laterality: Bilateral;  . Bunionectomy Bilateral 01/06/2015    Procedure: BUNIONECTOMY;  Surgeon: [name], MD;  Location: [facility] ORS;  Service: Orthopedics;  Laterality: Bilateral;    Current Outpatient Rx  Name  Route  Sig  Dispense  Refill  . apixaban (ELIQUIS) 5 MG TABS tablet   Oral   Take 1 tablet (5 mg total) by mouth 2 (two) times daily.   180 tablet   3   . Cranberry-Vit C-Lactobacillus (RA CRANBERRY SUPPLEMENTS PO)   Oral   Take by mouth daily.         . Cyanocobalamin (B-12) 1000 MCG CAPS   Oral   Take by mouth daily.         Marland Kitchen  ferrous sulfate 325 (65 FE) MG tablet   Oral   Take 325 mg by mouth daily with breakfast.         . Fish Oil-Cholecalciferol (FISH OIL + D3 PO)   Oral   Take 2 tablets by mouth daily.         Marland Kitchen gabapentin (NEURONTIN) 400 MG capsule   Oral   Take 1 capsule (400 mg total) by mouth 3 (three) times daily.   270 capsule   3   . HYDROcodone-acetaminophen (NORCO/VICODIN) 5-325 MG tablet   Oral   Take 1 tablet by mouth 2 (two) times daily as needed.   56 tablet   0   . LORazepam (ATIVAN) 1 MG tablet   Oral   Take 1 tablet (1 mg total) by mouth at bedtime.   90 tablet   1   . Multiple Vitamin (MULTIVITAMIN) tablet   Oral   Take 1 tablet by mouth daily.         Marland Kitchen oxymetazoline (AFRIN) 0.05 % nasal spray   Each Nare    Place 2 sprays into both nostrils at bedtime as needed for congestion.         . QUEtiapine Fumarate (SEROQUEL XR) 150 MG 24 hr tablet   Oral   Take 1 tablet (150 mg total) by mouth at bedtime.   90 tablet   3   . SALINE MIST SPRAY NA   Each Nare   Place 2 sprays into both nostrils at bedtime.         . simvastatin (ZOCOR) 20 MG tablet   Oral   Take 1 tablet (20 mg total) by mouth daily.   90 tablet   3   . solifenacin (VESICARE) 10 MG tablet   Oral   Take 1 tablet (10 mg total) by mouth daily.   90 tablet   3   . venlafaxine XR (EFFEXOR XR) 75 MG 24 hr capsule   Oral   Take 1 capsule (75 mg total) by mouth daily with breakfast. 3 a day   270 capsule   1   . vitamin C (ASCORBIC ACID) 250 MG tablet   Oral   Take 250 mg by mouth daily.           Allergies Review of patient's allergies indicates no known allergies.  Family History  Problem Relation Age of Onset  . Stroke Father     deceased  . Diabetes Father   . Hypertension Father   . Alcoholism Mother     died in her 62's.  . Cancer Mother   . Cancer Sister   . Prostate cancer Paternal Grandfather     Social History Social History  Substance Use Topics  . Smoking status: Never Smoker   . Smokeless tobacco: Never Used  . Alcohol Use: 3.6 oz/week    6 Cans of beer per week    Review of Systems Constitutional: No fever/chills Eyes: No visual changes. ENT: No sore throat. Cardiovascular: Denies chest pain. Respiratory: Denies shortness of breath. Gastrointestinal:No nausea, no vomiting.  No diarrhea.  No constipation. Genitourinary: Negative for dysuria. Musculoskeletal: Negative for back pain. Skin: Negative for rash. Neurological: Negative for headaches, focal weakness or numbness.  10-point ROS otherwise negative.  ____________________________________________   PHYSICAL EXAM:  VITAL SIGNS: ED Triage Vitals  Enc Vitals Group     BP 04/30/15 0914 123/87 mmHg     Pulse Rate  04/30/15 0914 91     Resp 04/30/15  0914 18     Temp 04/30/15 0914 98.6 F (37 C)     Temp Source 04/30/15 0914 Oral     SpO2 04/30/15 0914 95 %     Weight 04/30/15 0914 283 lb (128.368 kg)     Height 04/30/15 0914 5\' 11"  (1.803 m)     Head Cir --      Peak Flow --      Pain Score 04/30/15 0915 6     Pain Loc --      Pain Edu? --      Excl. in [place]? --     Constitutional: Alert and oriented. Well appearing and in no acute distress. Eyes: Conjunctivae are normal. PERRL. EOMI. Head: Atraumatic. Nose: No congestion/rhinnorhea. Mouth/Throat: Mucous membranes are moist.  Neck: No stridor.   Cardiovascular: Normal rate, regular rhythm. Grossly normal heart sounds.  Good peripheral circulation. Respiratory: Normal respiratory effort.  No retractions. Lungs CTAB. Gastrointestinal: Soft With mild-to-moderate tenderness palpation to the right lower quadrant or McBurney's point. There is no rebound or guarding. No distention. No CVA tenderness. Musculoskeletal: No lower extremity tenderness nor edema.  No joint effusions. Neurologic:  Normal speech and language. No gross focal neurologic deficits are appreciated. No gait instability. Skin:  Skin is warm, dry and intact. No rash noted. Psychiatric: Mood and affect are normal. Speech and behavior are normal.  ____________________________________________   LABS (all labs ordered are listed, but only abnormal results are displayed)  Labs Reviewed  COMPREHENSIVE METABOLIC PANEL - Abnormal; Notable for the following:    Glucose, Bld 150 (*)    All other components within normal limits  CBC WITH DIFFERENTIAL/PLATELET  LIPASE, BLOOD   ____________________________________________  EKG   ____________________________________________  RADIOLOGY  IMPRESSION: 1. No acute or inflammatory process identified in the abdomen or pelvis. Normal appendix. 2. Calcified aortic and coronary artery atherosclerosis.   Electronically Signed By: Genevie Ann M.D. On: 04/30/2015 11:39 ____________________________________________   PROCEDURES    ____________________________________________   INITIAL IMPRESSION / ASSESSMENT AND PLAN / ED COURSE  Pertinent labs & imaging results that were available during my care of the patient were reviewed by me and considered in my medical decision making (see chart for details).  ----------------------------------------- 11:54 AM on 04/30/2015 -----------------------------------------  Patient is resting comfortably and is in no acute distress. Very reassuring CAT scan. I reviewed the CAT scan with the patient as well as wife is at bedside. We also talked about the calcifications in his arteries.. Reassuring workup overall. History and physical consistent with musculoskeletal pain. Patient will try Tylenol as well as a heating pad and icy hot or Aspercreme. Patient also had a normal bowel movement this morning and denies any issues with constipation. Is unclear about what could've caused the pain in his abdomen.  Does not recall any specific injury but is new to the gym and is an active golfer. Possible muscle pull or strain through one of these issues.  ____________________________________________   FINAL CLINICAL IMPRESSION(S) / ED DIAGNOSES  Right lower quadrant abdominal pain.    Orbie Pyo, MD 04/30/15 [phone]

## 2015-04-30 NOTE — ED Notes (Signed)
Report given to Maryland Pink, RN

## 2015-04-30 NOTE — Discharge Instructions (Signed)

## 2015-05-10 ENCOUNTER — Other Ambulatory Visit: Payer: Self-pay | Admitting: Family Medicine

## 2015-05-11 ENCOUNTER — Other Ambulatory Visit: Payer: Self-pay

## 2015-05-11 ENCOUNTER — Ambulatory Visit (INDEPENDENT_AMBULATORY_CARE_PROVIDER_SITE_OTHER): Payer: BLUE CROSS/BLUE SHIELD | Admitting: Family Medicine

## 2015-05-11 ENCOUNTER — Encounter: Payer: Self-pay | Admitting: Family Medicine

## 2015-05-11 VITALS — BP 121/79 | HR 70 | Temp 98.5°F | Ht 70.3 in | Wt 289.0 lb

## 2015-05-11 DIAGNOSIS — M5136 Other intervertebral disc degeneration, lumbar region: Secondary | ICD-10-CM | POA: Insufficient documentation

## 2015-05-11 DIAGNOSIS — M5126 Other intervertebral disc displacement, lumbar region: Secondary | ICD-10-CM

## 2015-05-11 DIAGNOSIS — T84098D Other mechanical complication of other internal joint prosthesis, subsequent encounter: Secondary | ICD-10-CM

## 2015-05-11 DIAGNOSIS — I10 Essential (primary) hypertension: Secondary | ICD-10-CM

## 2015-05-11 DIAGNOSIS — M51369 Other intervertebral disc degeneration, lumbar region without mention of lumbar back pain or lower extremity pain: Secondary | ICD-10-CM | POA: Insufficient documentation

## 2015-05-11 DIAGNOSIS — T84099D Other mechanical complication of unspecified internal joint prosthesis, subsequent encounter: Secondary | ICD-10-CM | POA: Diagnosis not present

## 2015-05-11 DIAGNOSIS — Z0000 Encounter for general adult medical examination without abnormal findings: Secondary | ICD-10-CM

## 2015-05-11 DIAGNOSIS — Z1211 Encounter for screening for malignant neoplasm of colon: Secondary | ICD-10-CM

## 2015-05-11 DIAGNOSIS — Z96649 Presence of unspecified artificial hip joint: Secondary | ICD-10-CM

## 2015-05-11 DIAGNOSIS — F329 Major depressive disorder, single episode, unspecified: Secondary | ICD-10-CM

## 2015-05-11 DIAGNOSIS — I700 Atherosclerosis of aorta: Secondary | ICD-10-CM | POA: Diagnosis not present

## 2015-05-11 DIAGNOSIS — F32A Depression, unspecified: Secondary | ICD-10-CM

## 2015-05-11 DIAGNOSIS — Z113 Encounter for screening for infections with a predominantly sexual mode of transmission: Secondary | ICD-10-CM

## 2015-05-11 LAB — MICROSCOPIC EXAMINATION

## 2015-05-11 LAB — URINALYSIS, ROUTINE W REFLEX MICROSCOPIC
BILIRUBIN UA: NEGATIVE
Glucose, UA: NEGATIVE
Ketones, UA: NEGATIVE
NITRITE UA: POSITIVE — AB
Protein, UA: NEGATIVE
SPEC GRAV UA: 1.02 (ref 1.005–1.030)
UUROB: 0.2 mg/dL (ref 0.2–1.0)
pH, UA: 5.5 (ref 5.0–7.5)

## 2015-05-11 MED ORDER — VENLAFAXINE HCL ER 75 MG PO CP24: 75.0000 mg | ORAL_CAPSULE | Freq: Every day | ORAL | Status: DC

## 2015-05-11 MED ORDER — SIMVASTATIN 20 MG PO TABS: 20.0000 mg | ORAL_TABLET | Freq: Every day | ORAL | Status: DC

## 2015-05-11 MED ORDER — LORAZEPAM 1 MG PO TABS: 1.0000 mg | ORAL_TABLET | Freq: Every day | ORAL | Status: DC

## 2015-05-11 MED ORDER — SOLIFENACIN SUCCINATE 10 MG PO TABS: 10.0000 mg | ORAL_TABLET | Freq: Every day | ORAL | Status: DC

## 2015-05-11 MED ORDER — HYDROCODONE-ACETAMINOPHEN 5-325 MG PO TABS: 1.0000 | ORAL_TABLET | Freq: Two times a day (BID) | ORAL | Status: DC | PRN

## 2015-05-11 MED ORDER — QUETIAPINE FUMARATE ER 150 MG PO TB24: 150.0000 mg | ORAL_TABLET | Freq: Every day | ORAL | Status: DC

## 2015-05-11 MED ORDER — GABAPENTIN 400 MG PO CAPS: 400.0000 mg | ORAL_CAPSULE | Freq: Three times a day (TID) | ORAL | Status: DC

## 2015-05-11 MED ORDER — APIXABAN 5 MG PO TABS: 5.0000 mg | ORAL_TABLET | Freq: Two times a day (BID) | ORAL | Status: DC

## 2015-05-11 NOTE — Addendum Note (Signed)
Addended by: Rowe Clack H on: 05/11/2015 10:22 AM   Modules accepted: Miquel Dunn

## 2015-05-11 NOTE — Assessment & Plan Note (Signed)
The current medical regimen is effective;  continue present plan and medications.  

## 2015-05-11 NOTE — Progress Notes (Signed)
BP 121/79 mmHg  Pulse 70  Temp(Src) 98.5 F (36.9 C)  Ht 5' 10.3" (1.786 m)  Wt 289 lb (131.09 kg)  BMI 41.10 kg/m2  SpO2 95%   Subjective:    Patient ID: Nicholas Canterbury., male    DOB: 1945-05-21, 70 y.o.   MRN: [id number]  HPI: Nicholas Wise. is a 70 y.o. male  Chief Complaint  Patient presents with  . Annual Exam  . 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 Discussed with patient I will not be managing pain after the end of June. Patient understands no further narcotics from this office at that point. We will make referral to pain clinic patient understands the importance of expediting this process. Other medical issues stable takes cholesterol medicines without problems Still has some abdominal pain that was in the emergency room last week. Play golf again and symptoms came back and was diagnosed with musculoskeletal pain insistent with patient's history. Patient's nerves doing okay with medications takes lorazepam 1 mg every night without problems Taking Eliquis without bruising bleeding issues for for blood clots needs to stay on long-term  Relevant past medical, surgical, family and social history reviewed and updated as indicated. Interim medical history since our last visit reviewed. Allergies and medications reviewed and updated.  Other than above Review of Systems  Constitutional: Negative.   HENT: Negative.   Eyes: Negative.   Respiratory: Negative.   Cardiovascular: Negative.   Gastrointestinal: Negative.   Endocrine: Negative.   Genitourinary: Negative.   Musculoskeletal: Negative.   Skin: Negative.   Allergic/Immunologic: Negative.   Neurological: Negative.   Hematological: Negative.   Psychiatric/Behavioral: Negative.     Per HPI unless specifically indicated above     Objective:    BP 121/79 mmHg  Pulse 70   Temp(Src) 98.5 F (36.9 C)  Ht 5' 10.3" (1.786 m)  Wt 289 lb (131.09 kg)  BMI 41.10 kg/m2  SpO2 95%  Wt Readings from Last 3 Encounters:  05/11/15 289 lb (131.09 kg)  04/30/15 283 lb (128.368 kg)  02/26/15 293 lb (132.904 kg)    Physical Exam  Constitutional: He is oriented to person, place, and time. He appears well-developed and well-nourished.  HENT:  Head: Normocephalic and atraumatic.  Right Ear: External ear normal.  Left Ear: External ear normal.  Eyes: Conjunctivae and EOM are normal. Pupils are equal, round, and reactive to light.  Neck: Normal range of motion. Neck supple.  Cardiovascular: Normal rate, regular rhythm, normal heart sounds and intact distal pulses.   Pulmonary/Chest: Effort normal and breath sounds normal.  Abdominal: Soft. Bowel sounds are normal. There is no splenomegaly or hepatomegaly.  Genitourinary: Rectum normal, prostate normal and penis normal.  Musculoskeletal: Normal range of motion.  Neurological: He is alert and oriented to person, place, and time. He has normal reflexes.  Skin: No rash noted. No erythema.  Psychiatric: He has a normal mood and affect. His behavior is normal. Judgment and thought content normal.    Results for orders placed or performed during the hospital encounter of 04/30/15  CBC with Differential  Result Value Ref Range   WBC 4.6 3.8 - 10.6 K/uL   RBC 4.96 4.40 - 5.90 MIL/uL   Hemoglobin 15.2 13.0 - 18.0 g/dL   HCT 44.4 40.0 - 52.0 %   MCV 89.6 80.0 - 100.0 fL  MCH 30.6 26.0 - 34.0 pg   MCHC 34.1 32.0 - 36.0 g/dL   RDW 14.4 11.5 - 14.5 %   Platelets 154 150 - 440 K/uL   Neutrophils Relative % 65 %   Neutro Abs 3.0 1.4 - 6.5 K/uL   Lymphocytes Relative 21 %   Lymphs Abs 1.0 1.0 - 3.6 K/uL   Monocytes Relative 9 %   Monocytes Absolute 0.4 0.2 - 1.0 K/uL   Eosinophils Relative 4 %   Eosinophils Absolute 0.2 0 - 0.7 K/uL   Basophils Relative 1 %   Basophils Absolute 0.0 0 - 0.1 K/uL  Comprehensive metabolic  panel  Result Value Ref Range   Sodium 136 135 - 145 mmol/L   Potassium 3.8 3.5 - 5.1 mmol/L   Chloride 108 101 - 111 mmol/L   CO2 23 22 - 32 mmol/L   Glucose, Bld 150 (H) 65 - 99 mg/dL   BUN 17 6 - 20 mg/dL   Creatinine, Ser 0.89 0.61 - 1.24 mg/dL   Calcium 9.0 8.9 - 10.3 mg/dL   Total Protein 7.1 6.5 - 8.1 g/dL   Albumin 4.0 3.5 - 5.0 g/dL   AST 30 15 - 41 U/L   ALT 19 17 - 63 U/L   Alkaline Phosphatase 55 38 - 126 U/L   Total Bilirubin 1.0 0.3 - 1.2 mg/dL   GFR calc non Af Amer >60 >60 mL/min   GFR calc Af Amer >60 >60 mL/min   Anion gap 5 5 - 15  Lipase, blood  Result Value Ref Range   Lipase 32 11 - 51 U/L      Assessment & Plan:   Problem List Items Addressed This Visit      Cardiovascular and Mediastinum   HTN (hypertension) (Chronic)    The current medical regimen is effective;  continue present plan and medications.       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       Relevant Medications   LORazepam (ATIVAN) 1 MG tablet   venlafaxine XR (EFFEXOR XR) 75 MG 24 hr capsule    Other Visit Diagnoses    Routine general medical examination at a health care facility    -  Primary    Relevant Orders    CBC with Differential/Platelet    Comprehensive metabolic panel    Lipid Panel w/o Chol/HDL Ratio    PSA    TSH    Urinalysis, Routine w reflex microscopic (not at [facility])    Routine screening for STI (sexually transmitted  infection)        Relevant Orders    Hepatitis C Antibody        Follow up plan: Return in about 6 months (around 11/10/2015) for BMP, CBC.

## 2015-05-12 ENCOUNTER — Encounter: Payer: Self-pay | Admitting: Family Medicine

## 2015-05-12 LAB — CBC WITH DIFFERENTIAL/PLATELET
BASOS: 1 %
Basophils Absolute: 0 10*3/uL (ref 0.0–0.2)
EOS (ABSOLUTE): 0.3 10*3/uL (ref 0.0–0.4)
EOS: 7 %
HEMATOCRIT: 46.8 % (ref 37.5–51.0)
HEMOGLOBIN: 15.7 g/dL (ref 12.6–17.7)
Immature Grans (Abs): 0 10*3/uL (ref 0.0–0.1)
Immature Granulocytes: 0 %
LYMPHS ABS: 1.1 10*3/uL (ref 0.7–3.1)
Lymphs: 24 %
MCH: 31.3 pg (ref 26.6–33.0)
MCHC: 33.5 g/dL (ref 31.5–35.7)
MCV: 93 fL (ref 79–97)
MONOCYTES: 9 %
MONOS ABS: 0.4 10*3/uL (ref 0.1–0.9)
Neutrophils Absolute: 2.7 10*3/uL (ref 1.4–7.0)
Neutrophils: 59 %
Platelets: 164 10*3/uL (ref 150–379)
RBC: 5.02 x10E6/uL (ref 4.14–5.80)
RDW: 14.2 % (ref 12.3–15.4)
WBC: 4.6 10*3/uL (ref 3.4–10.8)

## 2015-05-12 LAB — COMPREHENSIVE METABOLIC PANEL
A/G RATIO: 1.7 (ref 1.2–2.2)
ALBUMIN: 4.3 g/dL (ref 3.6–4.8)
ALK PHOS: 65 IU/L (ref 39–117)
ALT: 18 IU/L (ref 0–44)
AST: 32 IU/L (ref 0–40)
BUN / CREAT RATIO: 14 (ref 10–24)
BUN: 16 mg/dL (ref 8–27)
Bilirubin Total: 1.1 mg/dL (ref 0.0–1.2)
CALCIUM: 9.4 mg/dL (ref 8.6–10.2)
CO2: 23 mmol/L (ref 18–29)
CREATININE: 1.15 mg/dL (ref 0.76–1.27)
Chloride: 103 mmol/L (ref 96–106)
GFR calc Af Amer: 75 mL/min/{1.73_m2} (ref >59)
GFR, EST NON AFRICAN AMERICAN: 65 mL/min/{1.73_m2} (ref >59)
GLOBULIN, TOTAL: 2.5 g/dL (ref 1.5–4.5)
Glucose: 157 mg/dL — ABNORMAL HIGH (ref 65–99)
POTASSIUM: 4.9 mmol/L (ref 3.5–5.2)
SODIUM: 144 mmol/L (ref 134–144)
Total Protein: 6.8 g/dL (ref 6.0–8.5)

## 2015-05-12 LAB — TSH: TSH: 1.83 u[IU]/mL (ref 0.450–4.500)

## 2015-05-12 LAB — LIPID PANEL W/O CHOL/HDL RATIO
Cholesterol, Total: 180 mg/dL (ref 100–199)
HDL: 56 mg/dL (ref >39)
LDL CALC: 103 mg/dL — AB (ref 0–99)
Triglycerides: 103 mg/dL (ref 0–149)
VLDL Cholesterol Cal: 21 mg/dL (ref 5–40)

## 2015-05-12 LAB — HEPATITIS C ANTIBODY

## 2015-05-12 LAB — PSA: PROSTATE SPECIFIC AG, SERUM: 1.9 ng/mL (ref 0.0–4.0)

## 2015-05-17 ENCOUNTER — Telehealth: Payer: Self-pay

## 2015-05-17 NOTE — Telephone Encounter (Signed)
Patient called to check the status of referrals for colonoscopy and pain clinic. Please call the patient [phone] to advise.

## 2015-05-17 NOTE — Telephone Encounter (Signed)
Returned patient's phone call.  Advised patient that both referrals would take up to 2 weeks.   Colonoscopy referral at [facility] have to be reviewed and triaged before colonoscopy appointment is made. For the pain center, the doctors have to review the notes before an appointment can be schedueled. I explained to the patient he was referred to [facility].  The patient was very understanding.  I explained to him if he hasn't heard anything back within a week or two, to give me call.

## 2015-05-18 ENCOUNTER — Other Ambulatory Visit: Payer: Self-pay

## 2015-05-18 ENCOUNTER — Telehealth: Payer: Self-pay | Admitting: Gastroenterology

## 2015-05-18 ENCOUNTER — Telehealth: Payer: Self-pay

## 2015-05-18 NOTE — Telephone Encounter (Signed)
Pt scheduled for screening colonoscopy at [facility] on 05/31/15. Instructs/rx mailed. Please precert. Blood thinner request has been faxed to Dr. Jeananne Rama.

## 2015-05-18 NOTE — Telephone Encounter (Signed)
Patient is returning a phone call regarding a colonoscopy °

## 2015-05-18 NOTE — Telephone Encounter (Signed)
Gastroenterology Pre-Procedure Review  Request Date: 05/31/15 Requesting Physician: Dr. Jeananne Rama  PATIENT REVIEW QUESTIONS: The patient responded to the following health history questions as indicated:    1. Are you having any GI issues? no 2. Do you have a personal history of Polyps? no 3. Do you have a family history of Colon Cancer or Polyps? no 4. Diabetes Mellitus? no 5. Joint replacements in the past 12 months?no 6. Major health problems in the past 3 months?no 7. Any artificial heart valves, MVP, or defibrillator?no    MEDICATIONS & ALLERGIES:    Patient reports the following regarding taking any anticoagulation/antiplatelet therapy:   Plavix, Coumadin, Eliquis, Xarelto, Lovenox, Pradaxa, Brilinta, or Effient? yes (Eliquis ) Aspirin? no  Patient confirms/reports the following medications:  Current Outpatient Prescriptions  Medication Sig Dispense Refill  . apixaban (ELIQUIS) 5 MG TABS tablet Take 1 tablet (5 mg total) by mouth 2 (two) times daily. 180 tablet 4  . Cranberry-Vit C-Lactobacillus (RA CRANBERRY SUPPLEMENTS PO) Take by mouth daily.    . Cyanocobalamin (B-12) 1000 MCG CAPS Take by mouth daily.    . ferrous sulfate 325 (65 FE) MG tablet Take 325 mg by mouth daily with breakfast.    . Fish Oil-Cholecalciferol (FISH OIL + D3 PO) Take 2 tablets by mouth daily.    Marland Kitchen gabapentin (NEURONTIN) 400 MG capsule Take 1 capsule (400 mg total) by mouth 3 (three) times daily. 270 capsule 4  . HYDROcodone-acetaminophen (NORCO/VICODIN) 5-325 MG tablet Take 1 tablet by mouth 2 (two) times daily as needed. 56 tablet 0  . LORazepam (ATIVAN) 1 MG tablet Take 1 tablet (1 mg total) by mouth at bedtime. 90 tablet 1  . Multiple Vitamin (MULTIVITAMIN) tablet Take 1 tablet by mouth daily.    Marland Kitchen oxymetazoline (AFRIN) 0.05 % nasal spray Place 2 sprays into both nostrils at bedtime as needed for congestion.    . QUEtiapine Fumarate (SEROQUEL XR) 150 MG 24 hr tablet Take 1 tablet (150 mg total) by  mouth at bedtime. 90 tablet 4  . simvastatin (ZOCOR) 20 MG tablet Take 1 tablet (20 mg total) by mouth daily. 90 tablet 4  . solifenacin (VESICARE) 10 MG tablet Take 1 tablet (10 mg total) by mouth daily. 90 tablet 4  . venlafaxine XR (EFFEXOR XR) 75 MG 24 hr capsule Take 1 capsule (75 mg total) by mouth daily with breakfast. 3 a day 270 capsule 4  . vitamin C (ASCORBIC ACID) 250 MG tablet Take 250 mg by mouth daily.     No current facility-administered medications for this visit.    Patient confirms/reports the following allergies:  No Known Allergies  No orders of the defined types were placed in this encounter.    AUTHORIZATION INFORMATION Primary Insurance: 1D#: Group #:  Secondary Insurance: 1D#: Group #:  SCHEDULE INFORMATION: Date: 05/31/15 Time: Location: [place]

## 2015-05-19 ENCOUNTER — Encounter: Payer: Self-pay | Admitting: Student in an Organized Health Care Education/Training Program

## 2015-05-27 NOTE — Discharge Instructions (Signed)

## 2015-06-03 ENCOUNTER — Telehealth: Payer: Self-pay | Admitting: *Deleted

## 2015-06-04 DIAGNOSIS — R0781 Pleurodynia: Secondary | ICD-10-CM | POA: Diagnosis not present

## 2015-06-07 ENCOUNTER — Other Ambulatory Visit: Payer: Self-pay | Admitting: Family Medicine

## 2015-06-07 MED ORDER — HYDROCODONE-ACETAMINOPHEN 5-325 MG PO TABS: 1.0000 | ORAL_TABLET | Freq: Two times a day (BID) | ORAL | Status: DC | PRN

## 2015-06-10 ENCOUNTER — Ambulatory Visit
Admission: RE | Admit: 2015-06-10 | Payer: BLUE CROSS/BLUE SHIELD | Source: Ambulatory Visit | Admitting: Gastroenterology

## 2015-06-10 SURGERY — COLONOSCOPY WITH PROPOFOL
Anesthesia: Choice

## 2015-06-30 ENCOUNTER — Encounter: Payer: Self-pay | Admitting: Anesthesiology

## 2015-06-30 ENCOUNTER — Telehealth: Payer: Self-pay | Admitting: Family Medicine

## 2015-06-30 ENCOUNTER — Ambulatory Visit: Payer: BLUE CROSS/BLUE SHIELD | Attending: Anesthesiology | Admitting: Anesthesiology

## 2015-06-30 VITALS — BP 119/90 | HR 63 | Resp 16 | Ht 70.0 in | Wt 289.0 lb

## 2015-06-30 DIAGNOSIS — R0602 Shortness of breath: Secondary | ICD-10-CM | POA: Diagnosis not present

## 2015-06-30 DIAGNOSIS — M79673 Pain in unspecified foot: Secondary | ICD-10-CM | POA: Diagnosis present

## 2015-06-30 DIAGNOSIS — I10 Essential (primary) hypertension: Secondary | ICD-10-CM | POA: Insufficient documentation

## 2015-06-30 DIAGNOSIS — F418 Other specified anxiety disorders: Secondary | ICD-10-CM | POA: Diagnosis not present

## 2015-06-30 DIAGNOSIS — M1611 Unilateral primary osteoarthritis, right hip: Secondary | ICD-10-CM | POA: Diagnosis not present

## 2015-06-30 DIAGNOSIS — R310 Gross hematuria: Secondary | ICD-10-CM | POA: Diagnosis not present

## 2015-06-30 DIAGNOSIS — M5126 Other intervertebral disc displacement, lumbar region: Secondary | ICD-10-CM | POA: Insufficient documentation

## 2015-06-30 DIAGNOSIS — M51369 Other intervertebral disc degeneration, lumbar region without mention of lumbar back pain or lower extremity pain: Secondary | ICD-10-CM

## 2015-06-30 DIAGNOSIS — I441 Atrioventricular block, second degree: Secondary | ICD-10-CM | POA: Diagnosis not present

## 2015-06-30 DIAGNOSIS — M5136 Other intervertebral disc degeneration, lumbar region: Secondary | ICD-10-CM | POA: Diagnosis not present

## 2015-06-30 DIAGNOSIS — E7800 Pure hypercholesterolemia, unspecified: Secondary | ICD-10-CM | POA: Diagnosis not present

## 2015-06-30 DIAGNOSIS — M160 Bilateral primary osteoarthritis of hip: Secondary | ICD-10-CM | POA: Diagnosis not present

## 2015-06-30 DIAGNOSIS — M47817 Spondylosis without myelopathy or radiculopathy, lumbosacral region: Secondary | ICD-10-CM

## 2015-06-30 DIAGNOSIS — Z96643 Presence of artificial hip joint, bilateral: Secondary | ICD-10-CM | POA: Diagnosis not present

## 2015-06-30 DIAGNOSIS — M1612 Unilateral primary osteoarthritis, left hip: Secondary | ICD-10-CM

## 2015-06-30 DIAGNOSIS — Z86718 Personal history of other venous thrombosis and embolism: Secondary | ICD-10-CM | POA: Diagnosis not present

## 2015-06-30 DIAGNOSIS — Z96652 Presence of left artificial knee joint: Secondary | ICD-10-CM | POA: Insufficient documentation

## 2015-06-30 DIAGNOSIS — N471 Phimosis: Secondary | ICD-10-CM | POA: Insufficient documentation

## 2015-06-30 DIAGNOSIS — N359 Urethral stricture, unspecified: Secondary | ICD-10-CM | POA: Insufficient documentation

## 2015-06-30 DIAGNOSIS — M25569 Pain in unspecified knee: Secondary | ICD-10-CM | POA: Diagnosis present

## 2015-06-30 DIAGNOSIS — M170 Bilateral primary osteoarthritis of knee: Secondary | ICD-10-CM

## 2015-06-30 DIAGNOSIS — Z833 Family history of diabetes mellitus: Secondary | ICD-10-CM | POA: Diagnosis not present

## 2015-06-30 DIAGNOSIS — I440 Atrioventricular block, first degree: Secondary | ICD-10-CM | POA: Diagnosis not present

## 2015-06-30 NOTE — Progress Notes (Signed)
New patient here for evaluation of multiple pain issues.  Patient has hx of blood clots.  Safety precautions to be maintained throughout the outpatient stay will include: orient to surroundings, keep bed in low position, maintain call bell within reach at all times, provide assistance with transfer out of bed and ambulation.

## 2015-06-30 NOTE — Telephone Encounter (Signed)
Returned patient's call x2, no answer.  Left msg MAC was leaving town until 07/12/15

## 2015-06-30 NOTE — Telephone Encounter (Signed)
Pt picked up 28 day prescription and stated he would need one more before his pain clinic appt in July.  I did let him know that Dr Jeananne Rama is not writing any more narcotics after this month, pt would like to speak with Dr Jeananne Rama.  Please call pt.

## 2015-07-01 NOTE — Progress Notes (Signed)
Subjective:  Patient ID: Nicholas Canterbury., male    DOB: 1945-06-26  Age: 70 y.o. MRN: [id number]  CC: Foot Pain; Knee Pain; Hip Pain; Back Pain; Arm Pain; and Shoulder Pain  Procedure none   HPI Nicholas Canterbury. presents for new patient evaluation. He is a pleasant 70 year old white male with a long-standing history of diffuse pain with severe degenerative arthritis and low back pain. He is referred by Dr. Golden Pop for assistance with his medication management. His pain complaints revolve around chronic low back pain and knee pain and hip pain and pain and shoulder pain. Had multiple surgeries and continues to have diffuse body pain that is currently managed with Vicodin 5 mg twice a day. This regimen seems to help keep his pain under good control and he states that nothing else has helped. The pain has gotten worse over the years with a maximum VAS of 10 at best and 8 does not appear to be influenced by time of day. Aggravating factors include bending kneeling lifting sitting standing squatting and alleviating factors include stretching cold application and medication management. He has associated spasms pain that wakes him up at night fatigue and dizziness and the pain description includes a quality of aching and increasing duration pain. He's had multiple studies to evaluate the nature of his pain and its found to be mainly consistent with osteoarthritis and degenerative joint disease.  History Nicholas [name] has a past medical history of High cholesterol; Bulging lumbar disc; Anxiety; Depression; First degree AV block; Mobitz type I Wenckebach atrioventricular block; History of blood clots; Gross hematuria; Anterior urethral stricture; BXO (balanitis xerotica obliterans); Dysuria; Phimosis; Shortness of breath dyspnea; Arthritis; and Hypertension.   He has past surgical history that includes Total hip arthroplasty (Right, 2004); Total hip arthroplasty (Left, 2006); Knee cartilage surgery (Left, 1965);  Knee arthroscopy (Right, 2013); Cataract extraction w/ intraocular lens  implant, bilateral (Bilateral, ~ 2010); Pilonidal cyst excision (1970's); Cardiac catheterization (~ 2005); Left Total Knee Arthroplasty; Hammer toe surgery (Bilateral, 01/06/2015); Bunionectomy (Bilateral, 01/06/2015); and Joint replacement (Bilateral).   His family history includes Alcoholism in his mother; Cancer in his mother and sister; Diabetes in his father; Hypertension in his father; Prostate cancer in his paternal grandfather; Stroke in his father.He reports that he has never smoked. He has never used smokeless tobacco. He reports that he drinks alcohol. He reports that he does not use illicit drugs.  No results found for this or any previous visit.  No results found for: TOXASSSELUR    ---------------------------------------------------------------------------------------------------------------------- Past Medical History  Diagnosis Date  . High cholesterol   . Bulging lumbar disc   . Anxiety   . Depression   . First degree AV block   . Mobitz type I Wenckebach atrioventricular block   . History of blood clots   . Gross hematuria   . Anterior urethral stricture   . BXO (balanitis xerotica obliterans)   . Dysuria   . Phimosis   . Shortness of breath dyspnea     ON EXERTION  . Arthritis     a. knees, hips, hands;  b. 11/2013 s/p L TKA @ [facility].  Marland Kitchen Hypertension     borderline    Past Surgical History  Procedure Laterality Date  . Total hip arthroplasty Right 2004  . Total hip arthroplasty Left 2006  . Knee cartilage surgery Left 1965    "football injury"  . Knee arthroscopy Right 2013  . Cataract extraction w/ intraocular lens  implant, bilateral Bilateral ~ 2010  . Pilonidal cyst excision  1970's  . Cardiac catheterization  ~ 2005    "once"  . Left total knee arthroplasty      a. 11/2013 [facility].  Esmeralda Links toe surgery Bilateral 01/06/2015    Procedure: HAMMER TOE CORRECTION;  Surgeon: [name], MD;  Location: [facility] ORS;  Service: Orthopedics;  Laterality: Bilateral;  . Bunionectomy Bilateral 01/06/2015    Procedure: BUNIONECTOMY;  Surgeon: [name], MD;  Location: [facility] ORS;  Service: Orthopedics;  Laterality: Bilateral;  . Joint replacement Bilateral     hips and knees    Family History  Problem Relation Age of Onset  . Stroke Father     deceased  . Diabetes Father   . Hypertension Father   . Alcoholism Mother     died in her 75's.  . Cancer Mother   . Cancer Sister   . Prostate cancer Paternal Grandfather     Social History  Substance Use Topics  . Smoking status: Never Smoker   . Smokeless tobacco: Never Used  . Alcohol Use: 0.0 oz/week    0 Standard drinks or equivalent per week     Comment: 2 DRINKS/MONTH    ---------------------------------------------------------------------------------------------------------------------- Social History   Social History  . Marital Status: Married    Spouse Name: N/A  . Number of Children: N/A  . Years of Education: N/A   Social History Main Topics  . Smoking status: Never Smoker   . Smokeless tobacco: Never Used  . Alcohol Use: 0.0 oz/week    0 Standard drinks or equivalent per week     Comment: 2 DRINKS/MONTH  . Drug Use: No  . Sexual Activity: Not Currently   Other Topics Concern  . None   Social History Narrative   Lives in [place] with wife.  Does not routinely exercise.  Activity severely limited by bilateral knee pain.      ----------------------------------------------------------------------------------------------------------------------  ROS Review of Systems  Cardiac: Negative gastrointestinal: Negative GI: Negative Endocrine: Negative GU: Negative Musculoskeletal as above Hematologic: Negative  Objective:  BP 119/90 mmHg  Pulse 63  Resp 16  Ht 5\' 10"  (1.778 m)  Wt 289 lb (131.09 kg)  BMI 41.47 kg/m2  SpO2 97%  Physical Exam  Patient is alert oriented cooperative  compliant. He is good historian Pupils are equally round reactive to light extraocular muscles intact Heart is regular rate and rhythm Lungs are clear to auscultation Inspection low back reveals paraspinous muscle tenderness but no overt trigger points He has a negative straight leg raise bilaterally with good muscle tone and bulk. He is extremely tender over the left foot from previous toe surgery. Muscle tone and bulk appears to be intact with no fasciculations and sensation to be grossly intact as well.  Previous MRI has revealed diffuse facet and degenerative disc disease throughout the lumbar spine.     Assessment & Plan:   [place] was seen today for foot pain, knee pain, hip pain, back pain, arm pain and shoulder pain.  Diagnoses and all orders for this visit:  DDD (degenerative disc disease), lumbar -     ToxASSURE Select 13 (MW), Urine -     ToxASSURE Select 13 (MW), Urine  Facet arthritis of lumbosacral region -     ToxASSURE Select 13 (MW), Urine -     ToxASSURE Select 13 (MW), Urine  Primary osteoarthritis of both knees -     ToxASSURE Select 13 (MW), Urine -  ToxASSURE Select 13 (MW), Urine  Osteoarthritis of right hip, unspecified osteoarthritis type -     ToxASSURE Select 13 (MW), Urine -     ToxASSURE Select 13 (MW), Urine  Primary osteoarthritis of left hip -     ToxASSURE Select 13 (MW), Urine -     ToxASSURE Select 13 (MW), Urine     ----------------------------------------------------------------------------------------------------------------------  Problem List Items Addressed This Visit    None    Visit Diagnoses    DDD (degenerative disc disease), lumbar    -  Primary    Relevant Orders    ToxASSURE Select 13 (MW), Urine    ToxASSURE Select 13 (MW), Urine    Facet arthritis of lumbosacral region        Relevant Orders    ToxASSURE Select 13 (MW), Urine    ToxASSURE Select 13 (MW), Urine    Primary osteoarthritis of both knees         Relevant Orders    ToxASSURE Select 13 (MW), Urine    ToxASSURE Select 13 (MW), Urine    Osteoarthritis of right hip, unspecified osteoarthritis type        Relevant Orders    ToxASSURE Select 13 (MW), Urine    ToxASSURE Select 13 (MW), Urine    Primary osteoarthritis of left hip        Relevant Orders    ToxASSURE Select 13 (MW), Urine    ToxASSURE Select 13 (MW), Urine       ----------------------------------------------------------------------------------------------------------------------  1. DDD (degenerative disc disease), lumbar He states that he has had previous evaluation for possible injection therapy. We have talked about options for a facet block consideration. We will give him literature regarding this today. He would need to come off his Eliquis in advance of this  - ToxASSURE Select 13 (MW), Urine - ToxASSURE Select 13 (MW), Urine  2. Facet arthritis of lumbosacral region  - ToxASSURE Select 13 (MW), Urine - ToxASSURE Select 13 (MW), Urine  3. Primary osteoarthritis of both knees  - ToxASSURE Select 13 (MW), Urine - ToxASSURE Select 13 (MW), Urine  4. Osteoarthritis of right hip, unspecified osteoarthritis type  - ToxASSURE Select 13 (MW), Urine - ToxASSURE Select 13 (MW), Urine  5. Primary osteoarthritis of left hip  - ToxASSURE Select 13 (MW), Urine - ToxASSURE Select 13 (MW), Urine    ----------------------------------------------------------------------------------------------------------------------  I am having Mr. Matteucci maintain his multivitamin, Fish Oil-Cholecalciferol (FISH OIL + D3 PO), oxymetazoline, B-12, ferrous sulfate, vitamin C, apixaban, gabapentin, LORazepam, QUEtiapine Fumarate, simvastatin, solifenacin, venlafaxine XR, CRANBERRY CONCENTRATE PO, and HYDROcodone-acetaminophen.   No orders of the defined types were placed in this encounter.       Follow-up: Return in about 1 month (around 07/30/2015) for evaluation, med refill.     [name], MD  This dictation was performed utilizing Dragon voice recognition software.  Please excuse any unintentional or mistaken typographical errors as a result of its unedited utilization.

## 2015-07-09 LAB — TOXASSURE SELECT 13 (MW), URINE: PDF: 0

## 2015-08-10 ENCOUNTER — Encounter: Payer: Self-pay | Admitting: Anesthesiology

## 2015-08-10 ENCOUNTER — Ambulatory Visit: Payer: BLUE CROSS/BLUE SHIELD | Attending: Anesthesiology | Admitting: Anesthesiology

## 2015-08-10 VITALS — BP 144/85 | HR 64 | Temp 98.6°F | Resp 18 | Ht 70.0 in | Wt 283.0 lb

## 2015-08-10 DIAGNOSIS — Z96653 Presence of artificial knee joint, bilateral: Secondary | ICD-10-CM | POA: Diagnosis not present

## 2015-08-10 DIAGNOSIS — M21611 Bunion of right foot: Secondary | ICD-10-CM | POA: Diagnosis not present

## 2015-08-10 DIAGNOSIS — I441 Atrioventricular block, second degree: Secondary | ICD-10-CM | POA: Insufficient documentation

## 2015-08-10 DIAGNOSIS — N471 Phimosis: Secondary | ICD-10-CM | POA: Insufficient documentation

## 2015-08-10 DIAGNOSIS — M21619 Bunion of unspecified foot: Secondary | ICD-10-CM | POA: Insufficient documentation

## 2015-08-10 DIAGNOSIS — M1611 Unilateral primary osteoarthritis, right hip: Secondary | ICD-10-CM

## 2015-08-10 DIAGNOSIS — M21612 Bunion of left foot: Secondary | ICD-10-CM | POA: Diagnosis not present

## 2015-08-10 DIAGNOSIS — Z7901 Long term (current) use of anticoagulants: Secondary | ICD-10-CM | POA: Diagnosis not present

## 2015-08-10 DIAGNOSIS — I440 Atrioventricular block, first degree: Secondary | ICD-10-CM | POA: Diagnosis not present

## 2015-08-10 DIAGNOSIS — F418 Other specified anxiety disorders: Secondary | ICD-10-CM | POA: Insufficient documentation

## 2015-08-10 DIAGNOSIS — M47817 Spondylosis without myelopathy or radiculopathy, lumbosacral region: Secondary | ICD-10-CM

## 2015-08-10 DIAGNOSIS — M170 Bilateral primary osteoarthritis of knee: Secondary | ICD-10-CM | POA: Diagnosis not present

## 2015-08-10 DIAGNOSIS — M47897 Other spondylosis, lumbosacral region: Secondary | ICD-10-CM | POA: Insufficient documentation

## 2015-08-10 DIAGNOSIS — E7800 Pure hypercholesterolemia, unspecified: Secondary | ICD-10-CM | POA: Insufficient documentation

## 2015-08-10 DIAGNOSIS — I10 Essential (primary) hypertension: Secondary | ICD-10-CM | POA: Insufficient documentation

## 2015-08-10 DIAGNOSIS — N359 Urethral stricture, unspecified: Secondary | ICD-10-CM | POA: Diagnosis not present

## 2015-08-10 DIAGNOSIS — M25559 Pain in unspecified hip: Secondary | ICD-10-CM | POA: Diagnosis present

## 2015-08-10 DIAGNOSIS — M5126 Other intervertebral disc displacement, lumbar region: Secondary | ICD-10-CM | POA: Diagnosis not present

## 2015-08-10 DIAGNOSIS — M549 Dorsalgia, unspecified: Secondary | ICD-10-CM | POA: Diagnosis present

## 2015-08-10 DIAGNOSIS — Z86718 Personal history of other venous thrombosis and embolism: Secondary | ICD-10-CM | POA: Insufficient documentation

## 2015-08-10 DIAGNOSIS — M5136 Other intervertebral disc degeneration, lumbar region: Secondary | ICD-10-CM | POA: Diagnosis not present

## 2015-08-10 DIAGNOSIS — Z96643 Presence of artificial hip joint, bilateral: Secondary | ICD-10-CM | POA: Diagnosis not present

## 2015-08-10 DIAGNOSIS — M1612 Unilateral primary osteoarthritis, left hip: Secondary | ICD-10-CM | POA: Diagnosis not present

## 2015-08-10 MED ORDER — HYDROCODONE-ACETAMINOPHEN 5-325 MG PO TABS: 1.0000 | ORAL_TABLET | Freq: Two times a day (BID) | ORAL | Status: DC | PRN

## 2015-08-10 NOTE — Progress Notes (Signed)
Safety precautions to be maintained throughout the outpatient stay will include: orient to surroundings, keep bed in low position, maintain call bell within reach at all times, provide assistance with transfer out of bed and ambulation.  

## 2015-08-11 NOTE — Progress Notes (Signed)
Subjective:  Patient ID: Nicholas [name]., male    DOB: [birth year]  Age: 70 y.o. MRN: [id number]  CC: Back Pain and Hip Pain  Procedure none Service Provided on Last Visit: Evaluation (new patient) HPI Nicholas [name]. presents for  reevaluation. He is a pleasant 70 year old white male with a long-standing history of diffuse pain with severe degenerative arthritis and low back pain. He is referred by Dr. Golden Pop for assistance with his medication management. His pain complaints revolve around chronic low back pain and knee pain and hip pain and pain and shoulder pain. Had multiple surgeries and continues to have diffuse body pain that is currently managed with Vicodin 5 mg twice a day. This regimen seems to help keep his pain under good control and he states that nothing else has helped. The pain has gotten worse over the years with a maximum VAS of 10 at best and 8 does not appear to be influenced by time of day. Aggravating factors include bending kneeling lifting sitting standing squatting and alleviating factors include stretching cold application and medication management. He has associated spasms pain that wakes him up at night fatigue and dizziness and the pain description includes a quality of aching and increasing duration pain. He's had multiple studies to evaluate the nature of his pain and its found to be mainly consistent with osteoarthritis and degenerative joint disease.  He presents today for reevaluation. The quality characteristic and distribution of his pain have been stable in nature without any changes noted. We have reviewed his tox screen and it is appropriate.  History Beacher has a past medical history of High cholesterol; Bulging lumbar disc; Anxiety; Depression; First degree AV block; Mobitz type I Wenckebach atrioventricular block; History of blood clots; Gross hematuria; Anterior urethral stricture; BXO (balanitis xerotica obliterans); Dysuria; Phimosis; Shortness of  breath dyspnea; Arthritis; and Hypertension.   He has past surgical history that includes Total hip arthroplasty (Right, 2004); Total hip arthroplasty (Left, 2006); Knee cartilage surgery (Left, 1965); Knee arthroscopy (Right, 2013); Cataract extraction w/ intraocular lens  implant, bilateral (Bilateral, ~ 2010); Pilonidal cyst excision (1970's); Cardiac catheterization (~ 2005); Left Total Knee Arthroplasty; Hammer toe surgery (Bilateral, 01/06/2015); Bunionectomy (Bilateral, 01/06/2015); and Joint replacement (Bilateral).   His family history includes Alcoholism in his mother; Cancer in his mother and sister; Diabetes in his father; Hypertension in his father; Prostate cancer in his paternal grandfather; Stroke in his father.He reports that he has never smoked. He has never used smokeless tobacco. He reports that he drinks alcohol. He reports that he does not use illicit drugs.  No results found for this or any previous visit.  TOXASSURE SELECT 13  Date Value Ref Range Status  06/30/2015 FINAL  Final    Comment:    ==================================================================== TOXASSURE SELECT 13 (MW) ==================================================================== Test                             Result       Flag       Units Drug Present and Declared for Prescription Verification   Lorazepam                      418          EXPECTED   ng/mg creat    Source of lorazepam is a scheduled prescription medication.   Hydrocodone  35           EXPECTED   ng/mg creat   Norhydrocodone                 114          EXPECTED   ng/mg creat    Sources of hydrocodone include scheduled prescription    medications. Norhydrocodone is an expected metabolite of    hydrocodone. ==================================================================== Test                      Result    Flag   Units      Ref Range   Creatinine              200              mg/dL       >=20 ==================================================================== Declared Medications:  The flagging and interpretation on this report are based on the  following declared medications.  Unexpected results may arise from  inaccuracies in the declared medications.  **Note: The testing scope of this panel includes these medications:  Hydrocodone (Hydrocodone-Acetaminophen)  Lorazepam  **Note: The testing scope of this panel does not include following  reported medications:  Acetaminophen (Hydrocodone-Acetaminophen)  Apixaban  Cyanocobalamin  Gabapentin  Iron  Multivitamin  Omega-3 Fatty Acids (Fish Oil)  Oxymetazoline  Quetiapine  Simvastatin  Solifenacin  Venlafaxine  Vitamin C ==================================================================== For clinical consultation, please call [phone]. ====================================================================       ---------------------------------------------------------------------------------------------------------------------- Past Medical History  Diagnosis Date  . High cholesterol   . Bulging lumbar disc   . Anxiety   . Depression   . First degree AV block   . Mobitz type I Wenckebach atrioventricular block   . History of blood clots   . Gross hematuria   . Anterior urethral stricture   . BXO (balanitis xerotica obliterans)   . Dysuria   . Phimosis   . Shortness of breath dyspnea     ON EXERTION  . Arthritis     a. knees, hips, hands;  b. 11/2013 s/p L TKA @ [place].  Marland Kitchen Hypertension     borderline    Past Surgical History  Procedure Laterality Date  . Total hip arthroplasty Right 2004  . Total hip arthroplasty Left 2006  . Knee cartilage surgery Left 1965    "football injury"  . Knee arthroscopy Right 2013  . Cataract extraction w/ intraocular lens  implant, bilateral Bilateral ~ 2010  . Pilonidal cyst excision  1970's  . Cardiac catheterization  ~ 2005    "once"  . Left total knee  arthroplasty      a. 11/2013 [facility].  Esmeralda Links toe surgery Bilateral 01/06/2015    Procedure: HAMMER TOE CORRECTION;  Surgeon: [name], MD;  Location: [facility] ORS;  Service: Orthopedics;  Laterality: Bilateral;  . Bunionectomy Bilateral 01/06/2015    Procedure: BUNIONECTOMY;  Surgeon: [name], MD;  Location: [facility] ORS;  Service: Orthopedics;  Laterality: Bilateral;  . Joint replacement Bilateral     hips and knees    Family History  Problem Relation Age of Onset  . Stroke Father     deceased  . Diabetes Father   . Hypertension Father   . Alcoholism Mother     died in her 57's.  . Cancer Mother   . Cancer Sister   . Prostate cancer Paternal Grandfather     Social History  Substance Use Topics  . Smoking status: Never Smoker   .  Smokeless tobacco: Never Used  . Alcohol Use: 0.0 oz/week    0 Standard drinks or equivalent per week     Comment: 2 DRINKS/MONTH    ---------------------------------------------------------------------------------------------------------------------- Social History   Social History  . Marital Status: Married    Spouse Name: N/A  . Number of Children: N/A  . Years of Education: N/A   Social History Main Topics  . Smoking status: Never Smoker   . Smokeless tobacco: Never Used  . Alcohol Use: 0.0 oz/week    0 Standard drinks or equivalent per week     Comment: 2 DRINKS/MONTH  . Drug Use: No  . Sexual Activity: Not Currently   Other Topics Concern  . None   Social History Narrative   Lives in [place] with wife.  Does not routinely exercise.  Activity severely limited by bilateral knee pain.      ----------------------------------------------------------------------------------------------------------------------  ROS Review of Systems  Cardiac: Negative gastrointestinal: Negative GI: Negative Endocrine: Negative GU: Negative Musculoskeletal as above Hematologic: Negative  Objective:  BP 144/85 mmHg  Pulse 64  Temp(Src)  98.6 F (37 C) (Oral)  Resp 18  Ht 5\' 10"  (1.778 m)  Wt 283 lb (128.368 kg)  BMI 40.61 kg/m2  SpO2 96%  Physical Exam  No change on physical exam is noted. Heart is regular rate and rhythm Lungs are clear to also dictation Strength in the lower extremities is without change.  Previous MRI has revealed diffuse facet and degenerative disc disease throughout the lumbar spine.     Assessment & Plan:   Dannel was seen today for back pain and hip pain.  Diagnoses and all orders for this visit:  DDD (degenerative disc disease), lumbar  Facet arthritis of lumbosacral region  Primary osteoarthritis of both knees  Osteoarthritis of right hip, unspecified osteoarthritis type  Primary osteoarthritis of left hip  Other orders -     HYDROcodone-acetaminophen (NORCO/VICODIN) 5-325 MG tablet; Take 1 tablet by mouth 2 (two) times daily as needed.     ----------------------------------------------------------------------------------------------------------------------  Problem List Items Addressed This Visit    None    Visit Diagnoses    DDD (degenerative disc disease), lumbar    -  Primary    Relevant Medications    HYDROcodone-acetaminophen (NORCO/VICODIN) 5-325 MG tablet    Facet arthritis of lumbosacral region        Relevant Medications    HYDROcodone-acetaminophen (NORCO/VICODIN) 5-325 MG tablet    Primary osteoarthritis of both knees        Osteoarthritis of right hip, unspecified osteoarthritis type        Relevant Medications    HYDROcodone-acetaminophen (NORCO/VICODIN) 5-325 MG tablet    Primary osteoarthritis of left hip        Relevant Medications    HYDROcodone-acetaminophen (NORCO/VICODIN) 5-325 MG tablet       ----------------------------------------------------------------------------------------------------------------------  1. DDD (degenerative disc disease), lumbar He states that he has had previous evaluation for possible injection therapy. We have  talked about options for a facet block consideration. We will give him literature regarding this today. He would need to come off his Eliquis in advance of this  -2. Facet arthritis of lumbosacral region 3. Primary osteoarthritis of both knees 4. Osteoarthritis of right hip, unspecified osteoarthritis type  -5. Primary osteoarthritis of left hip  -  ----------------------------------------------------------------------------------------------------------------------  I am having Mr. Ivanov maintain his multivitamin, Fish Oil-Cholecalciferol (FISH OIL + D3 PO), oxymetazoline, B-12, ferrous sulfate, vitamin C, apixaban, gabapentin, LORazepam, QUEtiapine Fumarate, simvastatin, solifenacin, venlafaxine XR, CRANBERRY CONCENTRATE  PO, and HYDROcodone-acetaminophen.   Meds ordered this encounter  Medications  . HYDROcodone-acetaminophen (NORCO/VICODIN) 5-325 MG tablet    Sig: Take 1 tablet by mouth 2 (two) times daily as needed.    Dispense:  60 tablet    Refill:  0       Follow-up: Return in about 1 month (around 09/10/2015) for evaluation, med refill.    [name], MD  This dictation was performed utilizing Dragon voice recognition software.  Please excuse any unintentional or mistaken typographical errors as a result of its unedited utilization.

## 2015-09-07 ENCOUNTER — Encounter: Payer: Self-pay | Admitting: Anesthesiology

## 2015-09-07 ENCOUNTER — Ambulatory Visit: Payer: BLUE CROSS/BLUE SHIELD | Attending: Anesthesiology | Admitting: Anesthesiology

## 2015-09-07 ENCOUNTER — Other Ambulatory Visit: Payer: Self-pay | Admitting: Gastroenterology

## 2015-09-07 VITALS — BP 138/91 | HR 63 | Temp 98.3°F | Resp 16 | Ht 70.0 in | Wt 285.0 lb

## 2015-09-07 DIAGNOSIS — E7800 Pure hypercholesterolemia, unspecified: Secondary | ICD-10-CM | POA: Diagnosis not present

## 2015-09-07 DIAGNOSIS — R1084 Generalized abdominal pain: Secondary | ICD-10-CM | POA: Diagnosis not present

## 2015-09-07 DIAGNOSIS — M5126 Other intervertebral disc displacement, lumbar region: Secondary | ICD-10-CM | POA: Diagnosis not present

## 2015-09-07 DIAGNOSIS — R1310 Dysphagia, unspecified: Secondary | ICD-10-CM | POA: Diagnosis not present

## 2015-09-07 DIAGNOSIS — Z7901 Long term (current) use of anticoagulants: Secondary | ICD-10-CM | POA: Diagnosis not present

## 2015-09-07 DIAGNOSIS — F329 Major depressive disorder, single episode, unspecified: Secondary | ICD-10-CM | POA: Insufficient documentation

## 2015-09-07 DIAGNOSIS — N471 Phimosis: Secondary | ICD-10-CM | POA: Diagnosis not present

## 2015-09-07 DIAGNOSIS — Z96643 Presence of artificial hip joint, bilateral: Secondary | ICD-10-CM | POA: Insufficient documentation

## 2015-09-07 DIAGNOSIS — Z961 Presence of intraocular lens: Secondary | ICD-10-CM | POA: Insufficient documentation

## 2015-09-07 DIAGNOSIS — M5136 Other intervertebral disc degeneration, lumbar region: Secondary | ICD-10-CM | POA: Insufficient documentation

## 2015-09-07 DIAGNOSIS — R310 Gross hematuria: Secondary | ICD-10-CM | POA: Diagnosis not present

## 2015-09-07 DIAGNOSIS — I440 Atrioventricular block, first degree: Secondary | ICD-10-CM | POA: Diagnosis not present

## 2015-09-07 DIAGNOSIS — M170 Bilateral primary osteoarthritis of knee: Secondary | ICD-10-CM | POA: Insufficient documentation

## 2015-09-07 DIAGNOSIS — Z823 Family history of stroke: Secondary | ICD-10-CM | POA: Insufficient documentation

## 2015-09-07 DIAGNOSIS — M160 Bilateral primary osteoarthritis of hip: Secondary | ICD-10-CM | POA: Insufficient documentation

## 2015-09-07 DIAGNOSIS — R0602 Shortness of breath: Secondary | ICD-10-CM | POA: Diagnosis not present

## 2015-09-07 DIAGNOSIS — I441 Atrioventricular block, second degree: Secondary | ICD-10-CM | POA: Insufficient documentation

## 2015-09-07 DIAGNOSIS — M1611 Unilateral primary osteoarthritis, right hip: Secondary | ICD-10-CM | POA: Diagnosis not present

## 2015-09-07 DIAGNOSIS — M47817 Spondylosis without myelopathy or radiculopathy, lumbosacral region: Secondary | ICD-10-CM

## 2015-09-07 DIAGNOSIS — I10 Essential (primary) hypertension: Secondary | ICD-10-CM | POA: Diagnosis not present

## 2015-09-07 DIAGNOSIS — M25551 Pain in right hip: Secondary | ICD-10-CM | POA: Diagnosis present

## 2015-09-07 DIAGNOSIS — Z86718 Personal history of other venous thrombosis and embolism: Secondary | ICD-10-CM | POA: Insufficient documentation

## 2015-09-07 DIAGNOSIS — Z1211 Encounter for screening for malignant neoplasm of colon: Secondary | ICD-10-CM | POA: Diagnosis not present

## 2015-09-07 DIAGNOSIS — M1612 Unilateral primary osteoarthritis, left hip: Secondary | ICD-10-CM | POA: Insufficient documentation

## 2015-09-07 DIAGNOSIS — Z96652 Presence of left artificial knee joint: Secondary | ICD-10-CM | POA: Insufficient documentation

## 2015-09-07 DIAGNOSIS — F419 Anxiety disorder, unspecified: Secondary | ICD-10-CM | POA: Insufficient documentation

## 2015-09-07 DIAGNOSIS — R112 Nausea with vomiting, unspecified: Secondary | ICD-10-CM | POA: Diagnosis not present

## 2015-09-07 DIAGNOSIS — M545 Low back pain: Secondary | ICD-10-CM | POA: Diagnosis present

## 2015-09-07 MED ORDER — HYDROCODONE-ACETAMINOPHEN 5-325 MG PO TABS: 1.0000 | ORAL_TABLET | Freq: Two times a day (BID) | ORAL | 0 refills | Status: DC | PRN

## 2015-09-07 NOTE — Progress Notes (Signed)
Safety precautions to be maintained throughout the outpatient stay will include: orient to surroundings, keep bed in low position, maintain call bell within reach at all times, provide assistance with transfer out of bed and ambulation.  

## 2015-09-07 NOTE — Patient Instructions (Signed)

## 2015-09-08 NOTE — Progress Notes (Signed)
Subjective:  Patient ID: Nicholas [name]., male    DOB: 1945/06/01  Age: 70 y.o. MRN: [id number]  CC: Back Pain (low) and Hip Pain (right)  Procedure none Service Provided on Last Visit: Med Refill, Evaluation HPI Nicholas [name]. presents for  reevaluation. He is a pleasant 70 year old white male with a long-standing history of diffuse pain with severe degenerative arthritis and low back pain. He is referred by Dr. Golden Pop for assistance with his medication management. His pain complaints revolve around chronic low back pain and knee pain and hip pain and pain and shoulder pain. Had multiple surgeries and continues to have diffuse body pain that is currently managed with Vicodin 5 mg twice a day. This regimen seems to help keep his pain under good control and he states that nothing else has helped. The pain has gotten worse over the years with a maximum VAS of 10 at best and 8 does not appear to be influenced by time of day. Aggravating factors include bending kneeling lifting sitting standing squatting and alleviating factors include stretching cold application and medication management. He has associated spasms pain that wakes him up at night fatigue and dizziness and the pain description includes a quality of aching and increasing duration pain. He's had multiple studies to evaluate the nature of his pain and its found to be mainly consistent with osteoarthritis and degenerative joint disease.  In mid July and states that the quality characteristic and distribution of his pain have been stable in nature. He still taking his medications as prescribed and doing well with his current regimen. No diverting or illicit use has been noted   History Nicholas [name] has a past medical history of Anterior urethral stricture; Anxiety; Arthritis; Bulging lumbar disc; BXO (balanitis xerotica obliterans); Depression; Dysuria; First degree AV block; Gross hematuria; High cholesterol; History of blood clots;  Hypertension; Mobitz type I Wenckebach atrioventricular block; Phimosis; and Shortness of breath dyspnea.   He has a past surgical history that includes Total hip arthroplasty (Right, 2004); Total hip arthroplasty (Left, 2006); Knee cartilage surgery (Left, 1965); Knee arthroscopy (Right, 2013); Cataract extraction w/ intraocular lens  implant, bilateral (Bilateral, ~ 2010); Pilonidal cyst excision (1970's); Cardiac catheterization (~ 2005); Left Total Knee Arthroplasty; Hammer toe surgery (Bilateral, 01/06/2015); Bunionectomy (Bilateral, 01/06/2015); and Joint replacement (Bilateral).   His family history includes Alcoholism in his mother; Cancer in his mother and sister; Diabetes in his father; Hypertension in his father; Prostate cancer in his paternal grandfather; Stroke in his father.He reports that he has never smoked. He has never used smokeless tobacco. He reports that he drinks alcohol. He reports that he does not use drugs.  No results found for this or any previous visit.  ToxAssure Select 13  Date Value Ref Range Status  06/30/2015 FINAL  Final    Comment:    ==================================================================== TOXASSURE SELECT 13 (MW) ==================================================================== Test                             Result       Flag       Units Drug Present and Declared for Prescription Verification   Lorazepam                      418          EXPECTED   ng/mg creat    Source of lorazepam is a scheduled prescription medication.  Hydrocodone                    35           EXPECTED   ng/mg creat   Norhydrocodone                 114          EXPECTED   ng/mg creat    Sources of hydrocodone include scheduled prescription    medications. Norhydrocodone is an expected metabolite of    hydrocodone. ==================================================================== Test                      Result    Flag   Units      Ref Range   Creatinine               200              mg/dL      >=20 ==================================================================== Declared Medications:  The flagging and interpretation on this report are based on the  following declared medications.  Unexpected results may arise from  inaccuracies in the declared medications.  **Note: The testing scope of this panel includes these medications:  Hydrocodone (Hydrocodone-Acetaminophen)  Lorazepam  **Note: The testing scope of this panel does not include following  reported medications:  Acetaminophen (Hydrocodone-Acetaminophen)  Apixaban  Cyanocobalamin  Gabapentin  Iron  Multivitamin  Omega-3 Fatty Acids (Fish Oil)  Oxymetazoline  Quetiapine  Simvastatin  Solifenacin  Venlafaxine  Vitamin C ==================================================================== For clinical consultation, please call [phone]. ====================================================================       ---------------------------------------------------------------------------------------------------------------------- Past Medical History:  Diagnosis Date  . Anterior urethral stricture   . Anxiety   . Arthritis    a. knees, hips, hands;  b. 11/2013 s/p L TKA @ [place].  . Bulging lumbar disc   . BXO (balanitis xerotica obliterans)   . Depression   . Dysuria   . First degree AV block   . Gross hematuria   . High cholesterol   . History of blood clots   . Hypertension    borderline  . Mobitz type I Wenckebach atrioventricular block   . Phimosis   . Shortness of breath dyspnea    ON EXERTION    Past Surgical History:  Procedure Laterality Date  . BUNIONECTOMY Bilateral 01/06/2015   Procedure: BUNIONECTOMY;  Surgeon: [name], MD;  Location: [facility] ORS;  Service: Orthopedics;  Laterality: Bilateral;  . CARDIAC CATHETERIZATION  ~ 2005   "once"  . CATARACT EXTRACTION W/ INTRAOCULAR LENS  IMPLANT, BILATERAL Bilateral ~ 2010  . HAMMER TOE SURGERY  Bilateral 01/06/2015   Procedure: HAMMER TOE CORRECTION;  Surgeon: [name], MD;  Location: [facility] ORS;  Service: Orthopedics;  Laterality: Bilateral;  . JOINT REPLACEMENT Bilateral    hips and knees  . KNEE ARTHROSCOPY Right 2013  . KNEE CARTILAGE SURGERY Left 1965   "football injury"  . Left Total Knee Arthroplasty     a. 11/2013 [facility].  Marland Kitchen PILONIDAL CYST EXCISION  1970's  . TOTAL HIP ARTHROPLASTY Right 2004  . TOTAL HIP ARTHROPLASTY Left 2006    Family History  Problem Relation Age of Onset  . Stroke Father     deceased  . Diabetes Father   . Hypertension Father   . Alcoholism Mother     died in her 98's.  . Cancer Mother   . Cancer Sister   . Prostate cancer Paternal Grandfather     Social  History  Substance Use Topics  . Smoking status: Never Smoker  . Smokeless tobacco: Never Used  . Alcohol use 0.0 oz/week     Comment: 2 DRINKS/MONTH    ---------------------------------------------------------------------------------------------------------------------- Social History   Social History  . Marital status: Married    Spouse name: N/A  . Number of children: N/A  . Years of education: N/A   Social History Main Topics  . Smoking status: Never Smoker  . Smokeless tobacco: Never Used  . Alcohol use 0.0 oz/week     Comment: 2 DRINKS/MONTH  . Drug use: No  . Sexual activity: Not Currently   Other Topics Concern  . None   Social History Narrative   Lives in [place] with wife.  Does not routinely exercise.  Activity severely limited by bilateral knee pain.      ----------------------------------------------------------------------------------------------------------------------  ROS Review of Systems  Cardiac: Negative gastrointestinal: Negative GI: Negative Endocrine: Negative GU: Negative Musculoskeletal as above Hematologic: Negative  Objective:  BP (!) 138/91 (BP Location: Right Arm, Patient Position: Sitting)   Pulse 63   Temp 98.3 F (36.8  C) (Oral)   Resp 16   Ht 5\' 10"  (1.778 m)   Wt 285 lb (129.3 kg)   SpO2 96%   BMI 40.89 kg/m   Physical Exam  No change on physical exam is noted. Heart is regular rate and rhythm Lungs are clear to also dictation Strength in the lower extremities is without change.  Previous MRI has revealed diffuse facet and degenerative disc disease throughout the lumbar spine.     Assessment & Plan:   Mahmoud was seen today for back pain and hip pain.  Diagnoses and all orders for this visit:  DDD (degenerative disc disease), lumbar  Primary osteoarthritis of left hip  Osteoarthritis of right hip, unspecified osteoarthritis type  Primary osteoarthritis of both knees  Facet arthritis of lumbosacral region  Other orders -     HYDROcodone-acetaminophen (NORCO/VICODIN) 5-325 MG tablet; Take 1 tablet by mouth 2 (two) times daily as needed.     ----------------------------------------------------------------------------------------------------------------------  Problem List Items Addressed This Visit    None    Visit Diagnoses    DDD (degenerative disc disease), lumbar    -  Primary   Relevant Medications   HYDROcodone-acetaminophen (NORCO/VICODIN) 5-325 MG tablet   Primary osteoarthritis of left hip       Relevant Medications   HYDROcodone-acetaminophen (NORCO/VICODIN) 5-325 MG tablet   Osteoarthritis of right hip, unspecified osteoarthritis type       Relevant Medications   HYDROcodone-acetaminophen (NORCO/VICODIN) 5-325 MG tablet   Primary osteoarthritis of both knees       Facet arthritis of lumbosacral region       Relevant Medications   HYDROcodone-acetaminophen (NORCO/VICODIN) 5-325 MG tablet      ----------------------------------------------------------------------------------------------------------------------  1. DDD (degenerative disc disease), lumbar He states that he has had previous evaluation for possible injection therapy. We have talked about options for  a facet block consideration. We will give him literature regarding this today. He would need to come off his Eliquis in advance of this. In the meantime we will refill his medications with return to clinic in 1 month for reevaluation. At present he seems to be doing well with this regimen.  -2. Facet arthritis of lumbosacral region 3. Primary osteoarthritis of both knees 4. Osteoarthritis of right hip, unspecified osteoarthritis type  -5. Primary osteoarthritis of left hip  -  ----------------------------------------------------------------------------------------------------------------------  I am having Mr. Chermak maintain his multivitamin, Fish Oil-Cholecalciferol (  FISH OIL + D3 PO), oxymetazoline, B-12, ferrous sulfate, vitamin C, apixaban, gabapentin, LORazepam, QUEtiapine Fumarate, simvastatin, solifenacin, venlafaxine XR, CRANBERRY CONCENTRATE PO, and HYDROcodone-acetaminophen.   Meds ordered this encounter  Medications  . HYDROcodone-acetaminophen (NORCO/VICODIN) 5-325 MG tablet    Sig: Take 1 tablet by mouth 2 (two) times daily as needed.    Dispense:  60 tablet    Refill:  0    Do not fill until [id number]       Follow-up: Return in about 1 month (around 10/08/2015) for evaluation, med refill.    [name], MD  This dictation was performed utilizing Dragon voice recognition software.  Please excuse any unintentional or mistaken typographical errors as a result of its unedited utilization.

## 2015-09-28 ENCOUNTER — Ambulatory Visit
Admission: RE | Admit: 2015-09-28 | Discharge: 2015-09-28 | Disposition: A | Discharge status: Home / Self Care | Payer: BLUE CROSS/BLUE SHIELD | Source: Ambulatory Visit | Attending: Gastroenterology | Admitting: Gastroenterology

## 2015-09-28 DIAGNOSIS — K449 Diaphragmatic hernia without obstruction or gangrene: Secondary | ICD-10-CM | POA: Insufficient documentation

## 2015-09-28 DIAGNOSIS — R1310 Dysphagia, unspecified: Secondary | ICD-10-CM | POA: Insufficient documentation

## 2015-10-05 ENCOUNTER — Encounter: Payer: Self-pay | Admitting: Anesthesiology

## 2015-10-05 ENCOUNTER — Ambulatory Visit: Payer: BLUE CROSS/BLUE SHIELD | Attending: Anesthesiology | Admitting: Anesthesiology

## 2015-10-05 VITALS — BP 135/84 | HR 85 | Temp 98.8°F | Resp 20 | Ht 70.0 in | Wt 289.0 lb

## 2015-10-05 DIAGNOSIS — I10 Essential (primary) hypertension: Secondary | ICD-10-CM | POA: Diagnosis not present

## 2015-10-05 DIAGNOSIS — Z96652 Presence of left artificial knee joint: Secondary | ICD-10-CM | POA: Insufficient documentation

## 2015-10-05 DIAGNOSIS — I441 Atrioventricular block, second degree: Secondary | ICD-10-CM | POA: Insufficient documentation

## 2015-10-05 DIAGNOSIS — M47816 Spondylosis without myelopathy or radiculopathy, lumbar region: Secondary | ICD-10-CM | POA: Insufficient documentation

## 2015-10-05 DIAGNOSIS — G8929 Other chronic pain: Secondary | ICD-10-CM | POA: Diagnosis not present

## 2015-10-05 DIAGNOSIS — F419 Anxiety disorder, unspecified: Secondary | ICD-10-CM | POA: Insufficient documentation

## 2015-10-05 DIAGNOSIS — Z86718 Personal history of other venous thrombosis and embolism: Secondary | ICD-10-CM | POA: Insufficient documentation

## 2015-10-05 DIAGNOSIS — N471 Phimosis: Secondary | ICD-10-CM | POA: Diagnosis not present

## 2015-10-05 DIAGNOSIS — M545 Low back pain: Secondary | ICD-10-CM | POA: Insufficient documentation

## 2015-10-05 DIAGNOSIS — M5136 Other intervertebral disc degeneration, lumbar region: Secondary | ICD-10-CM | POA: Diagnosis not present

## 2015-10-05 DIAGNOSIS — I440 Atrioventricular block, first degree: Secondary | ICD-10-CM | POA: Diagnosis not present

## 2015-10-05 DIAGNOSIS — N480 Leukoplakia of penis: Secondary | ICD-10-CM | POA: Diagnosis not present

## 2015-10-05 DIAGNOSIS — M1612 Unilateral primary osteoarthritis, left hip: Secondary | ICD-10-CM | POA: Diagnosis not present

## 2015-10-05 DIAGNOSIS — Z961 Presence of intraocular lens: Secondary | ICD-10-CM | POA: Insufficient documentation

## 2015-10-05 DIAGNOSIS — E7800 Pure hypercholesterolemia, unspecified: Secondary | ICD-10-CM | POA: Insufficient documentation

## 2015-10-05 DIAGNOSIS — M170 Bilateral primary osteoarthritis of knee: Secondary | ICD-10-CM | POA: Diagnosis not present

## 2015-10-05 DIAGNOSIS — Z96643 Presence of artificial hip joint, bilateral: Secondary | ICD-10-CM | POA: Diagnosis not present

## 2015-10-05 DIAGNOSIS — R310 Gross hematuria: Secondary | ICD-10-CM | POA: Insufficient documentation

## 2015-10-05 DIAGNOSIS — Z7901 Long term (current) use of anticoagulants: Secondary | ICD-10-CM | POA: Insufficient documentation

## 2015-10-05 DIAGNOSIS — F329 Major depressive disorder, single episode, unspecified: Secondary | ICD-10-CM | POA: Diagnosis not present

## 2015-10-05 DIAGNOSIS — M47817 Spondylosis without myelopathy or radiculopathy, lumbosacral region: Secondary | ICD-10-CM

## 2015-10-05 DIAGNOSIS — M62838 Other muscle spasm: Secondary | ICD-10-CM | POA: Insufficient documentation

## 2015-10-05 DIAGNOSIS — M1611 Unilateral primary osteoarthritis, right hip: Secondary | ICD-10-CM | POA: Diagnosis not present

## 2015-10-05 DIAGNOSIS — N359 Urethral stricture, unspecified: Secondary | ICD-10-CM | POA: Insufficient documentation

## 2015-10-05 MED ORDER — HYDROCODONE-ACETAMINOPHEN 5-325 MG PO TABS: 1.0000 | ORAL_TABLET | Freq: Two times a day (BID) | ORAL | 0 refills | Status: DC | PRN

## 2015-10-05 NOTE — Progress Notes (Signed)
Subjective:  Patient ID: Nicholas [name]., male    DOB: April 11, 1945  Age: 70 y.o. MRN: [id number]  CC: Back Pain (low)  Procedure none Service Provided on Last Visit: Med Refill, Evaluation HPI [name]. presents for  Reevaluation. He was seen 1 month ago and the quality characteristic and distribution of his pain are otherwise unchanged. He's been taking his medications as prescribed with no significant changes noted. He continues to gain good relief with his opioids and these enable him to function and improve his daily activities. No untoward effects are noted. Furthermore a style quality and distribution of his pain are stable in nature without significant change noted.  Historically, He is a pleasant 70 year old white male with a long-standing history of diffuse pain with severe degenerative arthritis and low back pain. He is referred by Dr. Golden Pop for assistance with his medication management. His pain complaints revolve around chronic low back pain and knee pain and hip pain and pain and shoulder pain. Had multiple surgeries and continues to have diffuse body pain that is currently managed with Vicodin 5 mg twice a day. This regimen seems to help keep his pain under good control and he states that nothing else has helped. The pain has gotten worse over the years with a maximum VAS of 10 at best and 8 does not appear to be influenced by time of day. Aggravating factors include bending kneeling lifting sitting standing squatting and alleviating factors include stretching cold application and medication management. He has associated spasms pain that wakes him up at night fatigue and dizziness and the pain description includes a quality of aching and increasing duration pain. He's had multiple studies to evaluate the nature of his pain and its found to be mainly consistent with osteoarthritis and degenerative joint disease.   History Geoge has a past medical history of Anterior  urethral stricture; Anxiety; Arthritis; Bulging lumbar disc; BXO (balanitis xerotica obliterans); Depression; Dysuria; First degree AV block; Gross hematuria; High cholesterol; History of blood clots; Hypertension; Mobitz type I Wenckebach atrioventricular block; Phimosis; and Shortness of breath dyspnea.   He has a past surgical history that includes Total hip arthroplasty (Right, 2004); Total hip arthroplasty (Left, 2006); Knee cartilage surgery (Left, 1965); Knee arthroscopy (Right, 2013); Cataract extraction w/ intraocular lens  implant, bilateral (Bilateral, ~ 2010); Pilonidal cyst excision (1970's); Cardiac catheterization (~ 2005); Left Total Knee Arthroplasty; Hammer toe surgery (Bilateral, 01/06/2015); Bunionectomy (Bilateral, 01/06/2015); and Joint replacement (Bilateral).   His family history includes Alcoholism in his mother; Cancer in his mother and sister; Diabetes in his father; Hypertension in his father; Prostate cancer in his paternal grandfather; Stroke in his father.He reports that he has never smoked. He has never used smokeless tobacco. He reports that he drinks alcohol. He reports that he does not use drugs.  No results found for this or any previous visit.  Tox[employer] Select 13  Date Value Ref Range Status  06/30/2015 FINAL  Final    Comment:    ==================================================================== TOXASSURE SELECT 13 (MW) ==================================================================== Test                             Result       Flag       Units Drug Present and Declared for Prescription Verification   Lorazepam  418          EXPECTED   ng/mg creat    Source of lorazepam is a scheduled prescription medication.   Hydrocodone                    35           EXPECTED   ng/mg creat   Norhydrocodone                 114          EXPECTED   ng/mg creat    Sources of hydrocodone include scheduled prescription    medications.  Norhydrocodone is an expected metabolite of    hydrocodone. ==================================================================== Test                      Result    Flag   Units      Ref Range   Creatinine              200              mg/dL      >=20 ==================================================================== Declared Medications:  The flagging and interpretation on this report are based on the  following declared medications.  Unexpected results may arise from  inaccuracies in the declared medications.  **Note: The testing scope of this panel includes these medications:  Hydrocodone (Hydrocodone-Acetaminophen)  Lorazepam  **Note: The testing scope of this panel does not include following  reported medications:  Acetaminophen (Hydrocodone-Acetaminophen)  Apixaban  Cyanocobalamin  Gabapentin  Iron  Multivitamin  Omega-3 Fatty Acids (Fish Oil)  Oxymetazoline  Quetiapine  Simvastatin  Solifenacin  Venlafaxine  Vitamin C ==================================================================== For clinical consultation, please call [phone]. ====================================================================       ---------------------------------------------------------------------------------------------------------------------- Past Medical History:  Diagnosis Date  . Anterior urethral stricture   . Anxiety   . Arthritis    a. knees, hips, hands;  b. 11/2013 s/p L TKA @ [place].  . Bulging lumbar disc   . BXO (balanitis xerotica obliterans)   . Depression   . Dysuria   . First degree AV block   . Gross hematuria   . High cholesterol   . History of blood clots   . Hypertension    borderline  . Mobitz type I Wenckebach atrioventricular block   . Phimosis   . Shortness of breath dyspnea    ON EXERTION    Past Surgical History:  Procedure Laterality Date  . BUNIONECTOMY Bilateral 01/06/2015   Procedure: BUNIONECTOMY;  Surgeon: [name], MD;   Location: [facility] ORS;  Service: Orthopedics;  Laterality: Bilateral;  . CARDIAC CATHETERIZATION  ~ 2005   "once"  . CATARACT EXTRACTION W/ INTRAOCULAR LENS  IMPLANT, BILATERAL Bilateral ~ 2010  . HAMMER TOE SURGERY Bilateral 01/06/2015   Procedure: HAMMER TOE CORRECTION;  Surgeon: [name], MD;  Location: [facility] ORS;  Service: Orthopedics;  Laterality: Bilateral;  . JOINT REPLACEMENT Bilateral    hips and knees  . KNEE ARTHROSCOPY Right 2013  . KNEE CARTILAGE SURGERY Left 1965   "football injury"  . Left Total Knee Arthroplasty     a. 11/2013 [facility].  Marland Kitchen PILONIDAL CYST EXCISION  1970's  . TOTAL HIP ARTHROPLASTY Right 2004  . TOTAL HIP ARTHROPLASTY Left 2006    Family History  Problem Relation Age of Onset  . Stroke Father     deceased  . Diabetes Father   . Hypertension Father   . Alcoholism Mother  died in her 84's.  . Cancer Mother   . Cancer Sister   . Prostate cancer Paternal Grandfather     Social History  Substance Use Topics  . Smoking status: Never Smoker  . Smokeless tobacco: Never Used  . Alcohol use 0.0 oz/week     Comment: 2 DRINKS/MONTH    ---------------------------------------------------------------------------------------------------------------------- Social History   Social History  . Marital status: Married    Spouse name: N/A  . Number of children: N/A  . Years of education: N/A   Social History Main Topics  . Smoking status: Never Smoker  . Smokeless tobacco: Never Used  . Alcohol use 0.0 oz/week     Comment: 2 DRINKS/MONTH  . Drug use: No  . Sexual activity: Not Currently   Other Topics Concern  . None   Social History Narrative   Lives in [place] with wife.  Does not routinely exercise.  Activity severely limited by bilateral knee pain.      ----------------------------------------------------------------------------------------------------------------------  ROS Review of Systems  No changes are mentioned  Objective:  BP  135/84 (BP Location: Left Arm, Patient Position: Sitting, Cuff Size: Large)   Pulse 85   Temp 98.8 F (37.1 C) (Oral)   Resp 20   Ht 5\' 10"  (1.778 m)   Wt 289 lb (131.1 kg)   SpO2 96%   BMI 41.47 kg/m   Physical Exam  No change on physical exam is noted. Heart is regular rate and rhythm Lungs are clear to also dictation Strength in the lower extremities is without changeAnd his exam appears stable  Previous MRI has revealed diffuse facet and degenerative disc disease throughout the lumbar spine.     Assessment & Plan:   Liron was seen today for back pain.  Diagnoses and all orders for this visit:  Primary osteoarthritis of left hip  Osteoarthritis of right hip, unspecified osteoarthritis type  Primary osteoarthritis of both knees  Facet arthritis of lumbosacral region  Other orders -     HYDROcodone-acetaminophen (NORCO/VICODIN) 5-325 MG tablet; Take 1 tablet by mouth 2 (two) times daily as needed.     ----------------------------------------------------------------------------------------------------------------------  Problem List Items Addressed This Visit    None    Visit Diagnoses    Primary osteoarthritis of left hip    -  Primary   Relevant Medications   HYDROcodone-acetaminophen (NORCO/VICODIN) 5-325 MG tablet   Osteoarthritis of right hip, unspecified osteoarthritis type       Relevant Medications   HYDROcodone-acetaminophen (NORCO/VICODIN) 5-325 MG tablet   Primary osteoarthritis of both knees       Facet arthritis of lumbosacral region       Relevant Medications   HYDROcodone-acetaminophen (NORCO/VICODIN) 5-325 MG tablet      ----------------------------------------------------------------------------------------------------------------------  1. DDD (degenerative disc disease), lumbar We've talked about options for interval injection therapy to help control his low back pain and symptom complex. At present he seems to be doing well with the  current regimen and refuses any further therapy at this time. We have offered an option for injection therapy should his pain periodically intensify.  -2. Facet arthritis of lumbosacral region 3. Primary osteoarthritis of both knees 4. Osteoarthritis of right hip, unspecified osteoarthritis type  -5. Primary osteoarthritis of left hip  -  ----------------------------------------------------------------------------------------------------------------------  I am having Mr. Byington maintain his multivitamin, Fish Oil-Cholecalciferol (FISH OIL + D3 PO), oxymetazoline, B-12, ferrous sulfate, vitamin C, apixaban, gabapentin, LORazepam, QUEtiapine Fumarate, simvastatin, solifenacin, venlafaxine XR, CRANBERRY CONCENTRATE PO, and HYDROcodone-acetaminophen.   Meds ordered this  encounter  Medications  . HYDROcodone-acetaminophen (NORCO/VICODIN) 5-325 MG tablet    Sig: Take 1 tablet by mouth 2 (two) times daily as needed.    Dispense:  60 tablet    Refill:  0    Do not fill until [id number]       Follow-up: Return in about 1 month (around 11/04/2015) for evaluation, med refill.    [name], MD  This dictation was performed utilizing Dragon voice recognition software.  Please excuse any unintentional or mistaken typographical errors as a result of its unedited utilization.

## 2015-10-05 NOTE — Progress Notes (Signed)
Safety precautions to be maintained throughout the outpatient stay will include: orient to surroundings, keep bed in low position, maintain call bell within reach at all times, provide assistance with transfer out of bed and ambulation.  

## 2015-10-26 ENCOUNTER — Encounter: Payer: Self-pay | Admitting: Anesthesiology

## 2015-10-26 ENCOUNTER — Ambulatory Visit: Payer: BLUE CROSS/BLUE SHIELD | Attending: Anesthesiology | Admitting: Anesthesiology

## 2015-10-26 VITALS — BP 167/81 | HR 73 | Temp 98.5°F | Resp 16 | Ht 71.0 in | Wt 289.0 lb

## 2015-10-26 DIAGNOSIS — M1611 Unilateral primary osteoarthritis, right hip: Secondary | ICD-10-CM | POA: Diagnosis not present

## 2015-10-26 DIAGNOSIS — M47817 Spondylosis without myelopathy or radiculopathy, lumbosacral region: Secondary | ICD-10-CM

## 2015-10-26 DIAGNOSIS — M25561 Pain in right knee: Secondary | ICD-10-CM | POA: Diagnosis present

## 2015-10-26 DIAGNOSIS — M1612 Unilateral primary osteoarthritis, left hip: Secondary | ICD-10-CM | POA: Diagnosis not present

## 2015-10-26 DIAGNOSIS — Z7901 Long term (current) use of anticoagulants: Secondary | ICD-10-CM | POA: Diagnosis not present

## 2015-10-26 DIAGNOSIS — M5136 Other intervertebral disc degeneration, lumbar region: Secondary | ICD-10-CM | POA: Diagnosis not present

## 2015-10-26 DIAGNOSIS — M4697 Unspecified inflammatory spondylopathy, lumbosacral region: Secondary | ICD-10-CM

## 2015-10-26 DIAGNOSIS — M4687 Other specified inflammatory spondylopathies, lumbosacral region: Secondary | ICD-10-CM | POA: Diagnosis not present

## 2015-10-26 DIAGNOSIS — M170 Bilateral primary osteoarthritis of knee: Secondary | ICD-10-CM | POA: Diagnosis not present

## 2015-10-26 DIAGNOSIS — Z79899 Other long term (current) drug therapy: Secondary | ICD-10-CM | POA: Insufficient documentation

## 2015-10-26 MED ORDER — HYDROCODONE-ACETAMINOPHEN 5-325 MG PO TABS: 1.0000 | ORAL_TABLET | Freq: Two times a day (BID) | ORAL | 0 refills | Status: DC | PRN

## 2015-10-26 NOTE — Progress Notes (Signed)
Subjective:  Patient ID: Nicholas [name]., male    DOB: 05-06-1945  Age: 70 y.o. MRN: [id number]  CC: Knee Pain (right)  Procedure none Service Provided on Last Visit: Med Refill, Evaluation HPI Nicholas [name]. presents for  Reevaluation.He was last seen approximately 3-4 weeks ago and the quality characteristic and distribution of his pain have remained stable. He states that since his last MRI several years ago the nature of his pain has been stable. No reported changes in lower extremity strength or function or bowel or bladder function are noted. He is due to see a gastroenterologist secondary to some GI issues unrelated to any incontinence problems. He is tolerating his medications without difficulty and they are improving his overall function based on his narcotic assessment sheet.  Historically, He is a pleasant 70 year old white male with a long-standing history of diffuse pain with severe degenerative arthritis and low back pain. He is referred by Dr. Golden Pop for assistance with his medication management. His pain complaints revolve around chronic low back pain and knee pain and hip pain and pain and shoulder pain. Had multiple surgeries and continues to have diffuse body pain that is currently managed with Vicodin 5 mg twice a day. This regimen seems to help keep his pain under good control and he states that nothing else has helped. The pain has gotten worse over the years with a maximum VAS of 10 at best and 8 does not appear to be influenced by time of day. Aggravating factors include bending kneeling lifting sitting standing squatting and alleviating factors include stretching cold application and medication management. He has associated spasms pain that wakes him up at night fatigue and dizziness and the pain description includes a quality of aching and increasing duration pain. He's had multiple studies to evaluate the nature of his pain and its found to be mainly consistent with  osteoarthritis and degenerative joint disease.   History Nicholas [name] has a past medical history of Anterior urethral stricture; Anxiety; Arthritis; Bulging lumbar disc; BXO (balanitis xerotica obliterans); Depression; Dysuria; First degree AV block; Gross hematuria; High cholesterol; History of blood clots; Hypertension; Mobitz type I Wenckebach atrioventricular block; Phimosis; and Shortness of breath dyspnea.   He has a past surgical history that includes Total hip arthroplasty (Right, 2004); Total hip arthroplasty (Left, 2006); Knee cartilage surgery (Left, 1965); Knee arthroscopy (Right, 2013); Cataract extraction w/ intraocular lens  implant, bilateral (Bilateral, ~ 2010); Pilonidal cyst excision (1970's); Cardiac catheterization (~ 2005); Left Total Knee Arthroplasty; Hammer toe surgery (Bilateral, 01/06/2015); Bunionectomy (Bilateral, 01/06/2015); and Joint replacement (Bilateral).   His family history includes Alcoholism in his mother; Cancer in his mother and sister; Diabetes in his father; Hypertension in his father; Prostate cancer in his paternal grandfather; Stroke in his father.He reports that he has never smoked. He has never used smokeless tobacco. He reports that he drinks alcohol. He reports that he does not use drugs.  No results found for this or any previous visit.  Tox[employer] Select 13  Date Value Ref Range Status  06/30/2015 FINAL  Final    Comment:    ==================================================================== TOXASSURE SELECT 13 (MW) ==================================================================== Test                             Result       Flag       Units Drug Present and Declared for Prescription Verification   Lorazepam  418          EXPECTED   ng/mg creat    Source of lorazepam is a scheduled prescription medication.   Hydrocodone                    35           EXPECTED   ng/mg creat   Norhydrocodone                 114           EXPECTED   ng/mg creat    Sources of hydrocodone include scheduled prescription    medications. Norhydrocodone is an expected metabolite of    hydrocodone. ==================================================================== Test                      Result    Flag   Units      Ref Range   Creatinine              200              mg/dL      >=20 ==================================================================== Declared Medications:  The flagging and interpretation on this report are based on the  following declared medications.  Unexpected results may arise from  inaccuracies in the declared medications.  **Note: The testing scope of this panel includes these medications:  Hydrocodone (Hydrocodone-Acetaminophen)  Lorazepam  **Note: The testing scope of this panel does not include following  reported medications:  Acetaminophen (Hydrocodone-Acetaminophen)  Apixaban  Cyanocobalamin  Gabapentin  Iron  Multivitamin  Omega-3 Fatty Acids (Fish Oil)  Oxymetazoline  Quetiapine  Simvastatin  Solifenacin  Venlafaxine  Vitamin C ==================================================================== For clinical consultation, please call [phone]. ====================================================================       ---------------------------------------------------------------------------------------------------------------------- Past Medical History:  Diagnosis Date  . Anterior urethral stricture   . Anxiety   . Arthritis    a. knees, hips, hands;  b. 11/2013 s/p L TKA @ [place].  . Bulging lumbar disc   . BXO (balanitis xerotica obliterans)   . Depression   . Dysuria   . First degree AV block   . Gross hematuria   . High cholesterol   . History of blood clots   . Hypertension    borderline  . Mobitz type I Wenckebach atrioventricular block   . Phimosis   . Shortness of breath dyspnea    ON EXERTION    Past Surgical History:  Procedure Laterality Date   . BUNIONECTOMY Bilateral 01/06/2015   Procedure: BUNIONECTOMY;  Surgeon: [name], MD;  Location: [facility] ORS;  Service: Orthopedics;  Laterality: Bilateral;  . CARDIAC CATHETERIZATION  ~ 2005   "once"  . CATARACT EXTRACTION W/ INTRAOCULAR LENS  IMPLANT, BILATERAL Bilateral ~ 2010  . HAMMER TOE SURGERY Bilateral 01/06/2015   Procedure: HAMMER TOE CORRECTION;  Surgeon: [name], MD;  Location: [facility] ORS;  Service: Orthopedics;  Laterality: Bilateral;  . JOINT REPLACEMENT Bilateral    hips and knees  . KNEE ARTHROSCOPY Right 2013  . KNEE CARTILAGE SURGERY Left 1965   "football injury"  . Left Total Knee Arthroplasty     a. 11/2013 [facility].  Marland Kitchen PILONIDAL CYST EXCISION  1970's  . TOTAL HIP ARTHROPLASTY Right 2004  . TOTAL HIP ARTHROPLASTY Left 2006    Family History  Problem Relation Age of Onset  . Stroke Father     deceased  . Diabetes Father   . Hypertension Father   . Alcoholism Mother  died in her 40's.  . Cancer Mother   . Cancer Sister   . Prostate cancer Paternal Grandfather     Social History  Substance Use Topics  . Smoking status: Never Smoker  . Smokeless tobacco: Never Used  . Alcohol use 0.0 oz/week     Comment: 2 DRINKS/MONTH    ---------------------------------------------------------------------------------------------------------------------- Social History   Social History  . Marital status: Married    Spouse name: N/A  . Number of children: N/A  . Years of education: N/A   Social History Main Topics  . Smoking status: Never Smoker  . Smokeless tobacco: Never Used  . Alcohol use 0.0 oz/week     Comment: 2 DRINKS/MONTH  . Drug use: No  . Sexual activity: Not Currently   Other Topics Concern  . None   Social History Narrative   Lives in [place] with wife.  Does not routinely exercise.  Activity severely limited by bilateral knee pain.       ----------------------------------------------------------------------------------------------------------------------  ROS Review of Systems  No changes are mentioned  Objective:  BP (!) 167/81 (BP Location: Right Arm, Patient Position: Sitting, Cuff Size: Normal)   Pulse 73   Temp 98.5 F (36.9 C) (Oral)   Resp 16   Ht 5\' 11"  (1.803 m)   Wt 289 lb (131.1 kg)   SpO2 97%   BMI 40.31 kg/m   Physical Exam  No change on physical exam is noted. Heart is regular rate and rhythm Lungs are clear to also dictation Strength in the lower extremities is without changeAnd his exam appears stable  Previous MRI has revealed diffuse facet and degenerative disc disease throughout the lumbar spine.     Assessment & Plan:   Dory was seen today for knee pain.  Diagnoses and all orders for this visit:  Primary osteoarthritis of left hip  Osteoarthritis of right hip, unspecified osteoarthritis type  Primary osteoarthritis of both knees  Facet arthritis of lumbosacral region ([place])  DDD (degenerative disc disease), lumbar  Other orders -     HYDROcodone-acetaminophen (NORCO/VICODIN) 5-325 MG tablet; Take 1 tablet by mouth 2 (two) times daily as needed.     ----------------------------------------------------------------------------------------------------------------------  Problem List Items Addressed This Visit    None    Visit Diagnoses    Primary osteoarthritis of left hip    -  Primary   Relevant Medications   HYDROcodone-acetaminophen (NORCO/VICODIN) 5-325 MG tablet   Osteoarthritis of right hip, unspecified osteoarthritis type       Relevant Medications   HYDROcodone-acetaminophen (NORCO/VICODIN) 5-325 MG tablet   Primary osteoarthritis of both knees       Facet arthritis of lumbosacral region ([place])       Relevant Medications   HYDROcodone-acetaminophen (NORCO/VICODIN) 5-325 MG tablet   DDD (degenerative disc disease), lumbar       Relevant Medications    HYDROcodone-acetaminophen (NORCO/VICODIN) 5-325 MG tablet      ----------------------------------------------------------------------------------------------------------------------  1. DDD (degenerative disc disease), lumbar We've talked about options for interval injection therapy to help control his low back pain and symptom complex. At present he seems to be doing well with the current regimen and refuses any further therapy at this time. We have offered an option for injection therapy should his pain periodically intensify. He is on Eliquis. We will have him return to clinic in 1 month with refills given today. -2. Facet arthritis of lumbosacral region.. I have encouraged him to continue with back stretching strengthening exercises and core strengthening. 3. Primary osteoarthritis of  both knees 4. Osteoarthritis of right hip, unspecified osteoarthritis type  -5. Primary osteoarthritis of left hip  -  ----------------------------------------------------------------------------------------------------------------------  I am having Mr. Feldmeier maintain his multivitamin, Fish Oil-Cholecalciferol (FISH OIL + D3 PO), oxymetazoline, B-12, ferrous sulfate, vitamin C, apixaban, gabapentin, LORazepam, QUEtiapine Fumarate, simvastatin, solifenacin, venlafaxine XR, CRANBERRY CONCENTRATE PO, pantoprazole, and HYDROcodone-acetaminophen.   Meds ordered this encounter  Medications  . pantoprazole (PROTONIX) 40 MG tablet    Sig: Take 40 mg by mouth daily.  Marland Kitchen HYDROcodone-acetaminophen (NORCO/VICODIN) 5-325 MG tablet    Sig: Take 1 tablet by mouth 2 (two) times daily as needed.    Dispense:  60 tablet    Refill:  0    Do not fill until [id number]       Follow-up: Return in about 1 month (around 11/26/2015) for evaluation, med refill.    [name], MD  This dictation was performed utilizing Dragon voice recognition software.  Please excuse any unintentional or mistaken typographical errors  as a result of its unedited utilization.

## 2015-10-26 NOTE — Progress Notes (Signed)
Safety precautions to be maintained throughout the outpatient stay will include: orient to surroundings, keep bed in low position, maintain call bell within reach at all times, provide assistance with transfer out of bed and ambulation.  

## 2015-11-16 ENCOUNTER — Ambulatory Visit (INDEPENDENT_AMBULATORY_CARE_PROVIDER_SITE_OTHER): Payer: BLUE CROSS/BLUE SHIELD | Admitting: Family Medicine

## 2015-11-16 ENCOUNTER — Encounter: Payer: Self-pay | Admitting: Family Medicine

## 2015-11-16 VITALS — BP 135/81 | HR 83 | Temp 98.6°F | Wt 294.0 lb

## 2015-11-16 DIAGNOSIS — I10 Essential (primary) hypertension: Secondary | ICD-10-CM | POA: Diagnosis not present

## 2015-11-16 DIAGNOSIS — Z23 Encounter for immunization: Secondary | ICD-10-CM

## 2015-11-16 DIAGNOSIS — I2699 Other pulmonary embolism without acute cor pulmonale: Secondary | ICD-10-CM | POA: Diagnosis not present

## 2015-11-16 DIAGNOSIS — R079 Chest pain, unspecified: Secondary | ICD-10-CM | POA: Diagnosis not present

## 2015-11-16 DIAGNOSIS — M545 Low back pain, unspecified: Secondary | ICD-10-CM | POA: Insufficient documentation

## 2015-11-16 NOTE — Assessment & Plan Note (Signed)
The current medical regimen is effective;  continue present plan and medications.  

## 2015-11-16 NOTE — Assessment & Plan Note (Signed)
Patient with nonspecific chest pain because of risk factors will refer to cardiology to further evaluate for potential cardiac disease.

## 2015-11-16 NOTE — Progress Notes (Addendum)
BP 135/81   Pulse 83   Temp 98.6 F (37 C)   Wt 294 lb (133.4 kg)   SpO2 95%   BMI 41.00 kg/m    Subjective:    Patient ID: Nicholas [name]., male    DOB: [birth year], 70 y.o.   MRN: [id number]  HPI: Nicholas [name]. is a 70 y.o. male  Chief Complaint  Patient presents with  . Hypertension  . Immunizations    he needs a rx for the shingles vaccine to take to the pharmacy.   Patient has noted increasing shortness of breath with exertion and some chest tightness goes away with rest. This has been ongoing and getting worse over this last couple of months.  Patient for regular follow-up medical problems has been doing well with no complaints taking Eliquis 5 mg twice a day with no bleeding bruising issues. Chart review I am unsure why patient has remained on Eliquis long-term. I am not seeing other risk factors other than the presenting DVT around the time of his knee surgery with resulting pulmonary embolus. This was in 2015. Dr. Marlene Lard in pulmonary reviewed patient's risk at that time will refer back to Dr. Marlene Lard to determine if long-term anticoagulation is needed.  Other medical issues stable no complaints Taking nerve medicines without problems Cholesterol good control no issues with medications Has chronic anxiety is been taking lorazepam 1 at bedtime for long-term without problems sleeps well with this medication. No increasing use or issues with early refills. Is followed by the pain clinic for pain management is taking hydrocodone.   Relevant past medical, surgical, family and social history reviewed and updated as indicated. Interim medical history since our last visit reviewed. Allergies and medications reviewed and updated.  Review of Systems  Constitutional: Negative.   Respiratory: Negative.   Cardiovascular: Negative.     Per HPI unless specifically indicated above     Objective:    BP 135/81   Pulse 83   Temp 98.6 F (37 C)   Wt 294 lb (133.4 kg)    SpO2 95%   BMI 41.00 kg/m   Wt Readings from Last 3 Encounters:  11/16/15 294 lb (133.4 kg)  10/26/15 289 lb (131.1 kg)  10/05/15 289 lb (131.1 kg)    Physical Exam  Constitutional: He is oriented to person, place, and time. He appears well-developed and well-nourished. No distress.  HENT:  Head: Normocephalic and atraumatic.  Right Ear: Hearing normal.  Left Ear: Hearing normal.  Nose: Nose normal.  Eyes: Conjunctivae and lids are normal. Right eye exhibits no discharge. Left eye exhibits no discharge. No scleral icterus.  Pulmonary/Chest: Effort normal. No respiratory distress.  Musculoskeletal: Normal range of motion.  Neurological: He is alert and oriented to person, place, and time.  Skin: Skin is intact. No rash noted.  Psychiatric: He has a normal mood and affect. His speech is normal and behavior is normal. Judgment and thought content normal. Cognition and memory are normal.    Results for orders placed or performed in visit on 06/30/15  ToxASSURE Select 13 (MW), Urine  Result Value Ref Range   ToxAssure Select 13 FINAL    PDF .       Assessment & Plan:   Problem List Items Addressed This Visit      Cardiovascular and Mediastinum   HTN (hypertension) - Primary (Chronic)    .The current medical regimen is effective;  continue present plan and medications.  Relevant Orders   Basic metabolic panel   CBC with Differential/Platelet   Pulmonary embolism [facility])    Patient still on anticoagulation will refer back to Dr. Marlene Lard to decide if medication still needed for anticoagulation.      Relevant Orders   Ambulatory referral to Pulmonology     Other   Chest pain    Patient with nonspecific chest pain because of risk factors will refer to cardiology to further evaluate for potential cardiac disease.      Relevant Orders   EKG 12-Lead (Completed)   Ambulatory referral to Cardiology    Other Visit Diagnoses    Needs flu shot       Encounter for  immunization       Relevant Medications   hyoscyamine (LEVSIN, ANASPAZ) 0.125 MG tablet   Other Relevant Orders   Basic metabolic panel   CBC with Differential/Platelet   Flu vaccine HIGH DOSE PF (Completed)   EKG 12-Lead (Completed)       Follow up plan: Return in about 6 months (around 05/16/2016) for Physical Exam.

## 2015-11-16 NOTE — Assessment & Plan Note (Signed)
Patient still on anticoagulation will refer back to Dr. Marlene Lard to decide if medication still needed for anticoagulation.

## 2015-11-17 ENCOUNTER — Encounter: Payer: Self-pay | Admitting: Family Medicine

## 2015-11-17 LAB — CBC WITH DIFFERENTIAL/PLATELET
BASOS ABS: 0 10*3/uL (ref 0.0–0.2)
Basos: 0 %
EOS (ABSOLUTE): 0.2 10*3/uL (ref 0.0–0.4)
EOS: 4 %
Hematocrit: 48.6 % (ref 37.5–51.0)
Hemoglobin: 16.9 g/dL (ref 12.6–17.7)
IMMATURE GRANULOCYTES: 1 %
Immature Grans (Abs): 0 10*3/uL (ref 0.0–0.1)
Lymphocytes Absolute: 1.4 10*3/uL (ref 0.7–3.1)
Lymphs: 24 %
MCH: 32.4 pg (ref 26.6–33.0)
MCHC: 34.8 g/dL (ref 31.5–35.7)
MCV: 93 fL (ref 79–97)
MONOS ABS: 0.5 10*3/uL (ref 0.1–0.9)
Monocytes: 8 %
NEUTROS PCT: 63 %
Neutrophils Absolute: 3.6 10*3/uL (ref 1.4–7.0)
PLATELETS: 170 10*3/uL (ref 150–379)
RBC: 5.21 x10E6/uL (ref 4.14–5.80)
RDW: 13.7 % (ref 12.3–15.4)
WBC: 5.7 10*3/uL (ref 3.4–10.8)

## 2015-11-17 LAB — BASIC METABOLIC PANEL
BUN/Creatinine Ratio: 15 (ref 10–24)
BUN: 18 mg/dL (ref 8–27)
CHLORIDE: 100 mmol/L (ref 96–106)
CO2: 24 mmol/L (ref 18–29)
CREATININE: 1.19 mg/dL (ref 0.76–1.27)
Calcium: 9.6 mg/dL (ref 8.6–10.2)
GFR calc Af Amer: 71 mL/min/{1.73_m2} (ref >59)
GFR calc non Af Amer: 62 mL/min/{1.73_m2} (ref >59)
GLUCOSE: 157 mg/dL — AB (ref 65–99)
POTASSIUM: 4.5 mmol/L (ref 3.5–5.2)
SODIUM: 142 mmol/L (ref 134–144)

## 2015-11-19 ENCOUNTER — Encounter: Payer: Self-pay | Admitting: Internal Medicine

## 2015-11-19 ENCOUNTER — Ambulatory Visit (INDEPENDENT_AMBULATORY_CARE_PROVIDER_SITE_OTHER)
Admission: RE | Admit: 2015-11-19 | Discharge: 2015-11-19 | Disposition: A | Discharge status: Home / Self Care | Payer: BLUE CROSS/BLUE SHIELD | Source: Ambulatory Visit | Attending: Internal Medicine | Admitting: Internal Medicine

## 2015-11-19 ENCOUNTER — Other Ambulatory Visit (INDEPENDENT_AMBULATORY_CARE_PROVIDER_SITE_OTHER): Payer: BLUE CROSS/BLUE SHIELD

## 2015-11-19 ENCOUNTER — Ambulatory Visit (INDEPENDENT_AMBULATORY_CARE_PROVIDER_SITE_OTHER): Payer: BLUE CROSS/BLUE SHIELD | Admitting: Internal Medicine

## 2015-11-19 VITALS — BP 140/82 | HR 79 | Ht 71.0 in | Wt 297.2 lb

## 2015-11-19 DIAGNOSIS — R0600 Dyspnea, unspecified: Secondary | ICD-10-CM

## 2015-11-19 DIAGNOSIS — I2782 Chronic pulmonary embolism: Secondary | ICD-10-CM | POA: Diagnosis not present

## 2015-11-19 DIAGNOSIS — R0602 Shortness of breath: Secondary | ICD-10-CM | POA: Diagnosis not present

## 2015-11-19 NOTE — Progress Notes (Signed)
Subjective:     Patient ID: Nicholas [name], male   DOB: 01-Jul-1945  MRN: [id number]     70 yowm never smoker but morbidly obese admitted [facility] with PE/ DVT   Admit date: 04/17/2013  Discharge date: 04/21/2013  Discharge Diagnoses:  Pulmonary Embolism  DVT  Presyncope  Hypotension  AKI  DISCHARGE PLAN BY DIAGNOSIS  Pulmonary Embolism  DVT  Discharge Plan:  - Continue anticoagulation with Coumadin. Take 7.5mg  daily until seen at [facility]madin Clinic on 04/23/13.  - Follow up appointment with Coumadin Clinic on 04/23/13 at 9:00AM.  - Follow up appointment with [facility] on 04/23/13 at 9:30AM.  Presyncope  Hypotension  Discharge Plan:  - Discontinue Azor.  - Please go to PCP office for blood work on Wednesday 04/23/13 either between 8:30 - 11:00 AM or 1:00 - 4:00 PM.  - Follow up with PCP on 04/28/13 at 3:30 PM for blood pressure medication review/changes. (Careful use of ACE along with NSAIDs given recent AKI)  AKI  Discharge Plan:  - Discontinue Azor and Mobic.  - Follow up BMP on 04/23/13, then follow up with PCP 04/28/13/  DISCHARGE SUMMARY  Nicholas [name] is a 70 y.o. y/o male with a recent admission for PE (discharged 3/20). Pt was discharged home on Coumadin with follow up appointments at Coumadin clinic. On 3/23, prior to going to Coumadin clinic, pt became diaphoretic, SOB, and had some chest pain. Symptoms resolved on their own so pt went in for his scheduled appt. Whilt at his appt, symptoms recurred. Staff at clinic noted pt to be hypotensive (60/40) with SpO2 of 89%. He was brought back to [facility] ED for questionable worsening PE.  During hospitalization, pt was treated with IV heparin with Coumadin bridge.  Echo was repeated and revealed improved RV size/function compared to prior study on previous admission. Venous dopplers were also repeated and did not reveal any propagation of thrombus since prior study on 04/18/13.  His presyncope symptoms were thought to be attributable to resumption of his  anti-hypertensives in the setting of recent PE with persistent moderate RV strain.  Pt was deemed stable enough for discharge home on 04/21/13. He is to continue Coumadin therapy and has a follow up appointment scheduled at Coumadin clinic on Wednesday 04/23/13. He also has follow up appointments with [facility] and his PCP as noted below.  SIGNIFICANT DIAGNOSTIC STUDIES  3/20 - discharged after hospitalization for PE. Workup during hospitalization showed + saddle PE, + DVT in right leg.  3/23 - went to Coumadin clinic, INR supratherapeutic, told to hold dose next day, went back today and become symptomatic.  3/26 - readmitted for presyncope.  3/27 Repeat Echo: LVEF 45-50%. When compared to 04/08/13 - RV size and function appears improved.  3/27 LE venous Dopplers: Right: Subacute DVT noted in the popliteal vein and tibial peroneal trunk. There is no propagation of thrombus since study of 04-08-13. No evidence of superficial thrombosis. Left: No evidence of DVT, superficial thrombosis, or Baker's cyst.      04/23/2013 1st  Pulmonary office visit/ Angelika Jerrett  Chief Complaint  Patient presents with  . Follow-up    HFU- D/C from [facility] 3/30 for PE. Pt states his breathing has improved. Denies SOB, cough, and CP. Pt states he has a golf ball size knot on posterior/lateral right leg, only painful when pushing on it.   no assoc leg swelling. No pleuritic cp/ no h/o leg trauma. No pain in thigh unless pushes on the knot rec Your  main risk factor for future clots is your weight > defer treatment Nicholas [name] and fine to let him adjust your coumadin also  You have a strained Right heart from the blood clots and the treatment is a minimum of 6 months coumadin then recheck Echo at 09/2013 to be sure it has recovered and the clot is gone (repeat doppler R leg)    08/14/2013  Nicholas [name] re: pre-op consultation per Dr Sabra Heck  Chief Complaint  Patient presents with  . Follow-up    Pre op clearance for left knee  replacement (Dr [name]) - Denies sob, cough , wheeze or chest tightness ( CHanged from Coumadin to Eliquis 2 days ago   Not limited by breathing from desired activities  But by knees, esp Left  rec Please see patient coordinator before you leave today  to schedule repeat echo and dopplers of both legs on same day  If these are both normalized then the risk of clotting off the blood thinner is low but not zero and you will need bridged back to eliquis as soon as possible post op with either heparin or lovenox    11/13/2013  reconsult ov/Nicholas [name] re: ? Clear for surgery L TKR and TURP Chief Complaint  Patient presents with  . Follow-up    Pt needs sx clearance for L knee replacement.  Pt has no breathing complaints at this time.     Not sob but very sedentary due to knee pain, also ? Needs TURP, both to be done at [facility] Please see patient coordinator before you leave today  to schedule repeat echo to see if your right heart function has improved and if not you may need a temporary filter placed prior to your surgery > echo ok so cleared     02/12/2014 f/u ov/Nicholas [name] re surg clearance  Chief Complaint  Patient presents with  . Follow-up    Surgical clearance-Right knee replacement.No sob no cough.Surgery Feb. 3rd.  walks at [employer] > energy level an issue/ cc fatigue  but breathing fine, one month ahead of schedule on the L knee so ready for the R knee surgery now   rec   schedule venous dopplers bilaterally - Repeat venous dopplers 02/19/14 > small chronic clots on R only  You are cleared for surgery pending the results of the venous dopplers > we will let [name] in [place] know > ok to use the same routine you did last time in terms of the periop eliquis management and restart asap post op    11/19/2015 acute extended ov/Nicholas [name] re:  New sob maint on eliquis 5 mg bid / has saba/ not using  Chief Complaint  Patient presents with  . Acute Visit    Pt c/o DOE for the past  month. He gets SOB walking short distances. He is supposed to have colonoscopy soon and needs approval to come off of the eliquis.    down to get the paper and slt  hill back to house s difficulty until a month prior to OV/ no significant change in tendency for leg swellling, no assoc cough or cp  No obvious day to day or daytime variabilty or assoc chronic excess/ purulent sputum or mucus plugs  chest tightness, subjective wheeze overt sinus or hb symptoms. No unusual exp hx or h/o childhood pna/ asthma or knowledge of premature birth.  Sleeping ok without nocturnal  or early am exacerbation  of respiratory  c/o's or need for noct saba. Also denies  any obvious fluctuation of symptoms with weather or environmental changes or other aggravating or alleviating factors except as outlined above   Current Medications, Allergies, Complete Past Medical History, Past Surgical History, Family History, and Social History were reviewed in [employer] record.  ROS  The following are not active complaints unless bolded sore throat, dysphagia, dental problems, itching, sneezing,  nasal congestion or excess/ purulent secretions, ear ache,   fever, chills, sweats, unintended wt loss, pleuritic or exertional cp, hemoptysis,  orthopnea pnd or leg swelling, presyncope, palpitations, heartburn, abdominal pain, anorexia, nausea, vomiting, diarrhea  or change in bowel or urinary habits, change in stools or urine, dysuria,hematuria,  rash, arthralgias, visual complaints, headache, numbness weakness or ataxia or problems with walking or coordination,  change in mood/affect or memory.             Objective:   Physical Exam  08/14/2013        282 > 11/13/2013 287 >  02/12/2014 275  >  11/19/2015   294     04/23/13 296 lb 6.4 oz (134.446 kg)  04/18/13 300 lb 8 oz (136.306 kg)  04/11/13 296 lb 1.2 oz (134.3 kg)     Pleasant  amb obese wm nad  .  HEENT: nl dentition, turbinates, and orophanx. Nl  external ear canals without cough reflex   NECK :  without JVD/Nodes/TM/ nl carotid upstrokes bilaterally   LUNGS: no acc muscle use, clear to A and P bilaterally without cough on insp or exp maneuvers   CV:  RRR  no s3 or murmur - nl P2, no edema   ABD:  soft and nontender with nl excursion in the supine position. No bruits or organomegaly, bowel sounds nl  MS:  warm without deformities, calf tenderness, cyanosis or clubbing -  Marked serpiginous vericosities both lower ext  SKIN: warm and dry without lesions      CXR PA and Lateral:   11/19/2015 :    I personally reviewed images and agree with radiology impression as follows:   Low lung volumes with elevated right hemidiaphragm. No active Disease. Elevated R HD is chronic    Labs ordered/ reviewed:    Labs ordered/ reviewed:      Chemistry      Component Value Date/Time   NA 142 11/16/2015 0926   NA 137 02/26/2014 0448   K 4.5 11/16/2015 0926   K 3.8 02/26/2014 0448   CL 100 11/16/2015 0926   CL 106 02/26/2014 0448   CO2 24 11/16/2015 0926   CO2 25 02/26/2014 0448   BUN 18 11/16/2015 0926   BUN 20 (H) 02/26/2014 0448   CREATININE 1.19 11/16/2015 0926   CREATININE 0.94 02/26/2014 0448      Component Value Date/Time   CALCIUM 9.6 11/16/2015 0926   CALCIUM 8.4 (L) 02/26/2014 0448   ALKPHOS 65 05/11/2015 0911   ALKPHOS 45 04/07/2013 1201   AST 32 05/11/2015 0911   AST 40 (H) 10/20/2014 1600   AST 28 04/07/2013 1201   ALT 18 05/11/2015 0911   ALT 23 10/20/2014 1600   ALT 22 04/07/2013 1201   BILITOT 1.1 05/11/2015 0911   BILITOT 0.5 04/07/2013 1201        Lab Results  Component Value Date   WBC 5.7 11/16/2015   HGB 15.2 04/30/2015   HCT 48.6 11/16/2015   MCV 93 11/16/2015   PLT 170 11/16/2015     Lab Results  Component Value Date   DDIMER  0.34 11/19/2015      Lab Results  Component Value Date   TSH 1.01 11/19/2015     Lab Results  Component Value Date   PROBNP 77.0 11/19/2015                        .    Assessment:

## 2015-11-19 NOTE — Patient Instructions (Addendum)
Please remember to go to the lab and x-ray department downstairs for your tests - we will call you with the results when they are available.   Please see patient coordinator before you leave today  to schedule venous dopplers both legs

## 2015-11-20 ENCOUNTER — Encounter: Payer: Self-pay | Admitting: Internal Medicine

## 2015-11-20 LAB — D-DIMER, QUANTITATIVE: D-Dimer, Quant: 0.34 mcg/mL FEU (ref <0.50)

## 2015-11-20 NOTE — Assessment & Plan Note (Addendum)
Dx 04/17/13 with R DVT and RV strain - Repeat Echo 08/26/2013 - Normal LV size with EF 45-50%. Basal to mid inferior hypokinesis. Normal RV size with mildly decreased systolic function.  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 - Repeat Echo  11/18/2013 Normal to mildly reduced LV function (EF 50); mild LAE; trace MR and TR. RV ok so 11/18/2013 ok'd for orthopedic surgery hold x 36 h (3 half lives) preoop and restart post op or bridge with lovenox > tolerated fine - Repeat venous dopplers 02/19/14 > small chronic clots on R only   - D dimer nl 11/19/2015 on eliquis therapeutic doses so no reason he can't stop the eliquis for procedure and restart immediately without reload. Would hold the pm dose on day minus two and all doses on day minus one and day of colonoscopy and start back the next am  if hemostasis not an issue  Discussed in detail all the  indications, usual  risks and alternatives  relative to the benefits with patient who agrees to proceed with rx as outlined,  Additional 10 min spent in discussion on this issue for total of 25/40 min ov in counseling

## 2015-11-20 NOTE — Assessment & Plan Note (Signed)
11/19/2015  Walked RA x 3 laps @ 185 ft each stopped due to end of study, no sob or desats / nl pace, pulse at end only 83

## 2015-11-22 LAB — BRAIN NATRIURETIC PEPTIDE: Pro B Natriuretic peptide (BNP): 77 pg/mL (ref 0.0–100.0)

## 2015-11-22 LAB — TSH: TSH: 1.01 u[IU]/mL (ref 0.35–4.50)

## 2015-11-22 NOTE — Progress Notes (Signed)
Spoke with the spouse and notified of results

## 2015-11-22 NOTE — Progress Notes (Signed)
Spoke with pt and notified of results per Dr. Wert. Pt verbalized understanding and denied any questions. 

## 2015-11-23 ENCOUNTER — Other Ambulatory Visit: Payer: Self-pay | Admitting: Family Medicine

## 2015-11-23 ENCOUNTER — Ambulatory Visit (HOSPITAL_COMMUNITY)
Admission: RE | Admit: 2015-11-23 | Discharge: 2015-11-23 | Disposition: A | Discharge status: Home / Self Care | Payer: BLUE CROSS/BLUE SHIELD | Source: Ambulatory Visit | Attending: Cardiovascular Disease | Admitting: Cardiovascular Disease

## 2015-11-23 DIAGNOSIS — I82531 Chronic embolism and thrombosis of right popliteal vein: Secondary | ICD-10-CM | POA: Insufficient documentation

## 2015-11-23 DIAGNOSIS — I2782 Chronic pulmonary embolism: Secondary | ICD-10-CM | POA: Diagnosis not present

## 2015-11-23 NOTE — Telephone Encounter (Signed)
OK to call in. I will only send 30, if he wants to wait until Dr. Jeananne Rama returns, he can likely get his 43

## 2015-11-23 NOTE — Progress Notes (Signed)
Spoke with pt and notified of results per Dr. Wert. Pt verbalized understanding and denied any questions. 

## 2015-11-23 NOTE — Telephone Encounter (Signed)
Called into [facility]. 

## 2015-11-23 NOTE — Telephone Encounter (Signed)
Routing to provider  

## 2015-11-24 NOTE — Progress Notes (Signed)
Spoke with pt and notified of results per Dr. Wert. Pt verbalized understanding and denied any questions. 

## 2015-11-25 ENCOUNTER — Encounter: Payer: Self-pay | Admitting: Internal Medicine

## 2015-11-25 ENCOUNTER — Ambulatory Visit (INDEPENDENT_AMBULATORY_CARE_PROVIDER_SITE_OTHER): Payer: BLUE CROSS/BLUE SHIELD | Admitting: Internal Medicine

## 2015-11-25 ENCOUNTER — Encounter: Payer: Self-pay | Admitting: Anesthesiology

## 2015-11-25 ENCOUNTER — Ambulatory Visit: Payer: BLUE CROSS/BLUE SHIELD | Attending: Anesthesiology | Admitting: Anesthesiology

## 2015-11-25 VITALS — BP 141/70 | HR 56 | Temp 98.3°F | Resp 18 | Ht 71.0 in | Wt 297.0 lb

## 2015-11-25 VITALS — BP 130/80 | HR 63 | Ht 71.0 in | Wt 297.8 lb

## 2015-11-25 DIAGNOSIS — M170 Bilateral primary osteoarthritis of knee: Secondary | ICD-10-CM | POA: Insufficient documentation

## 2015-11-25 DIAGNOSIS — M160 Bilateral primary osteoarthritis of hip: Secondary | ICD-10-CM | POA: Diagnosis not present

## 2015-11-25 DIAGNOSIS — G8929 Other chronic pain: Secondary | ICD-10-CM | POA: Diagnosis not present

## 2015-11-25 DIAGNOSIS — R079 Chest pain, unspecified: Secondary | ICD-10-CM

## 2015-11-25 DIAGNOSIS — Z7901 Long term (current) use of anticoagulants: Secondary | ICD-10-CM | POA: Insufficient documentation

## 2015-11-25 DIAGNOSIS — Z79899 Other long term (current) drug therapy: Secondary | ICD-10-CM | POA: Diagnosis not present

## 2015-11-25 DIAGNOSIS — M5136 Other intervertebral disc degeneration, lumbar region: Secondary | ICD-10-CM | POA: Insufficient documentation

## 2015-11-25 DIAGNOSIS — M4697 Unspecified inflammatory spondylopathy, lumbosacral region: Secondary | ICD-10-CM | POA: Diagnosis not present

## 2015-11-25 DIAGNOSIS — R0609 Other forms of dyspnea: Secondary | ICD-10-CM | POA: Insufficient documentation

## 2015-11-25 DIAGNOSIS — I441 Atrioventricular block, second degree: Secondary | ICD-10-CM | POA: Diagnosis not present

## 2015-11-25 DIAGNOSIS — M4687 Other specified inflammatory spondylopathies, lumbosacral region: Secondary | ICD-10-CM | POA: Insufficient documentation

## 2015-11-25 DIAGNOSIS — R0602 Shortness of breath: Secondary | ICD-10-CM | POA: Insufficient documentation

## 2015-11-25 DIAGNOSIS — M47896 Other spondylosis, lumbar region: Secondary | ICD-10-CM | POA: Diagnosis not present

## 2015-11-25 DIAGNOSIS — M25551 Pain in right hip: Secondary | ICD-10-CM | POA: Diagnosis present

## 2015-11-25 DIAGNOSIS — M1612 Unilateral primary osteoarthritis, left hip: Secondary | ICD-10-CM | POA: Diagnosis not present

## 2015-11-25 DIAGNOSIS — I440 Atrioventricular block, first degree: Secondary | ICD-10-CM

## 2015-11-25 DIAGNOSIS — M1611 Unilateral primary osteoarthritis, right hip: Secondary | ICD-10-CM

## 2015-11-25 DIAGNOSIS — M47817 Spondylosis without myelopathy or radiculopathy, lumbosacral region: Secondary | ICD-10-CM

## 2015-11-25 MED ORDER — FUROSEMIDE 20 MG PO TABS: ORAL_TABLET | ORAL | 2 refills | Status: DC

## 2015-11-25 MED ORDER — FUROSEMIDE 20 MG PO TABS: ORAL_TABLET | ORAL | Status: DC

## 2015-11-25 MED ORDER — HYDROCODONE-ACETAMINOPHEN 5-325 MG PO TABS: 1.0000 | ORAL_TABLET | Freq: Two times a day (BID) | ORAL | 0 refills | Status: DC | PRN

## 2015-11-25 NOTE — Progress Notes (Signed)
Safety precautions to be maintained throughout the outpatient stay will include: orient to surroundings, keep bed in low position, maintain call bell within reach at all times, provide assistance with transfer out of bed and ambulation.  

## 2015-11-25 NOTE — Patient Instructions (Signed)
You were given 2 prescriptions for Hydrocodone today. 

## 2015-11-25 NOTE — Progress Notes (Signed)
Patient Care Team: [name], MD as PCP - General (Family Medicine) [name], MD (Specialist) Hollice Espy, MD as Consulting Physician (Urology) Tanda Rockers, MD as Consulting Physician (Pulmonary Disease) Dionisio David, MD as Consulting Physician (Cardiology)   HPI  Nicholas [name]. is a 70 y.o. male Seen in follow-up after a two-year hiatus with a history of first-degree AV block. This was second-degree heart block. 2 years ago he was asymptomatic.  He is referred back now because of a two-month history of shortness of breath. This is quite abrupt in onset. It is been unassociated with palpitations or chest discomfort. He has chronic peripheral edema. He has no nocturnal dyspnea or orthopnea. He does have significant sleep disordered breathing.  Echocardiogram 10/15 demonstrated mild LV dysfunction at 50%.; Original echo 3/15 demonstrated RV dysfunction which recovered  His also had a history of pulmonary embolism with repeat venous Dopplers 1/16 demonstrated small clots. He has been managed with chronic apixaban  . He saw Dr. Clois Comber yesterday. A chest x-ray was undertaken which demonstrated no significant abnormalities apart from low volumes.    Records and Results Reviewed Pulmonary notes TSH was normal d-dimer was normal obtained just last week Past Medical History:  Diagnosis Date  . Anterior urethral stricture   . Anxiety   . Arthritis    a. knees, hips, hands;  b. 11/2013 s/p L TKA @ [facility].  . Bulging lumbar disc   . BXO (balanitis xerotica obliterans)   . Depression   . First degree AV block   . Gross hematuria   . History of blood clots   . Hypertension    borderline  . Mobitz type I Wenckebach atrioventricular block   . Phimosis   . Shortness of breath dyspnea    ON EXERTION    Past Surgical History:  Procedure Laterality Date  . BUNIONECTOMY Bilateral 01/06/2015   Procedure: BUNIONECTOMY;  Surgeon: [name], MD;  Location:  [facility] ORS;  Service: Orthopedics;  Laterality: Bilateral;  . CARDIAC CATHETERIZATION  ~ 2005   "once"  . CATARACT EXTRACTION W/ INTRAOCULAR LENS  IMPLANT, BILATERAL Bilateral ~ 2010  . HAMMER TOE SURGERY Bilateral 01/06/2015   Procedure: HAMMER TOE CORRECTION;  Surgeon: [name], MD;  Location: [facility] ORS;  Service: Orthopedics;  Laterality: Bilateral;  . JOINT REPLACEMENT Bilateral    hips and knees  . KNEE ARTHROSCOPY Right 2013  . KNEE CARTILAGE SURGERY Left 1965   "football injury"  . Left Total Knee Arthroplasty     a. 11/2013 [facility].  Marland Kitchen PILONIDAL CYST EXCISION  1970's  . TOTAL HIP ARTHROPLASTY Right 2004  . TOTAL HIP ARTHROPLASTY Left 2006    Current Outpatient Prescriptions  Medication Sig Dispense Refill  . apixaban (ELIQUIS) 5 MG TABS tablet Take 1 tablet (5 mg total) by mouth 2 (two) times daily. 180 tablet 4  . CRANBERRY CONCENTRATE PO Take 1 tablet by mouth daily.     . Cyanocobalamin (B-12) 1000 MCG CAPS Take 1 tablet by mouth daily.     . ferrous sulfate 325 (65 FE) MG tablet Take 325 mg by mouth daily with breakfast.    . Fish Oil-Cholecalciferol (FISH OIL + D3 PO) Take 1,200 mg by mouth daily. AM    . gabapentin (NEURONTIN) 400 MG capsule Take 1 capsule (400 mg total) by mouth 3 (three) times daily. 270 capsule 4  . HYDROcodone-acetaminophen (NORCO/VICODIN) 5-325 MG tablet Take 1 tablet by mouth 2 (two)  times daily as needed. 60 tablet 0  . hyoscyamine (LEVSIN, ANASPAZ) 0.125 MG tablet Take 0.125 mg by mouth every 6 (six) hours as needed.    Marland Kitchen LORazepam (ATIVAN) 1 MG tablet TAKE ONE TABLET BY MOUTH AT BEDTIME 30 tablet 0  . Multiple Vitamin (MULTIVITAMIN) tablet Take 2 tablets by mouth daily. GUMMIES    . oxymetazoline (AFRIN) 0.05 % nasal spray Place 2 sprays into both nostrils at bedtime as needed for congestion.    . pantoprazole (PROTONIX) 40 MG tablet Take 40 mg by mouth daily.    . QUEtiapine Fumarate (SEROQUEL XR) 150 MG 24 hr tablet Take 1 tablet (150 mg total)  by mouth at bedtime. 90 tablet 4  . simvastatin (ZOCOR) 20 MG tablet Take 1 tablet (20 mg total) by mouth daily. 90 tablet 4  . solifenacin (VESICARE) 10 MG tablet Take 1 tablet (10 mg total) by mouth daily. 90 tablet 4  . venlafaxine XR (EFFEXOR XR) 75 MG 24 hr capsule Take 1 capsule (75 mg total) by mouth daily with breakfast. 3 a day 270 capsule 4  . vitamin C (ASCORBIC ACID) 250 MG tablet Take 250 mg by mouth daily.     No current facility-administered medications for this visit.     No Known Allergies    Review of Systems negative except from HPI and PMH  Physical Exam BP 130/80   Pulse 63   Ht 5\' 11"  (1.803 m)   Wt 297 lb 12.8 oz (135.1 kg)   SpO2 97%   BMI 41.53 kg/m  Well developed and Morbidly obese  in no acute distress HENT normal E scleral and icterus clear Neck Supple JV8-10carotids brisk and full Clear to ausculation Regular rate and rhythm, no murmurs gallops or rub Soft with active bowel sounds No clubbing cyanosis Trace Edema Alert and oriented, grossly normal motor and sensory function Skin Warm and Dry   sinus rhythm at 67 Intervals 34/10/40 with blocked PACs  Assessment and  Plan   dyspnea on exertion  Obesity  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 While there is no accompanying chest pain the abruptness begs the question of mechanism. We'll undertake Myoview scanning.  In addition, with evidence of elevated JVP, we need to look at LV and RV structure and function. We will obtain an echocardiogram. I agree with Dr. Lowella Dell assessment that it is unlikely that he has recurrent pulmonary embolism. This is supported by his negative d-dimer. Venous Dopplers were apparently unremarkable.  With his history of exertional dyspnea and bradycardia, chronotropic competence is also begged. We'll plan to undertake treadmill testing at  the time of his Myoview scan. I suspect he will not be able to accomplish his maximum heart rate and he will need  a Lexiscan  We will also obtain a 24-hour Holter monitor     Current medicines are reviewed at length with the patient today .  The patient does not  have concerns regarding medicines.

## 2015-11-25 NOTE — Patient Instructions (Addendum)
Medication Instructions: - Your physician has recommended you make the following change in your medication:  1) Start lasix (furosemide) 20 mg one tablet on Tuesday/ Thursday/ Saturday/ Sunday  Labwork: - none ordered  Procedures/Testing: - Your physician has requested that you have an exercise stress myoview. For further information please visit [email or web address]. Please follow instruction sheet, as given.  - Your physician has requested that you have an echocardiogram. Echocardiography is a painless test that uses sound waves to create images of your heart. It provides your doctor with information about the size and shape of your heart and how well your heart's chambers and valves are working. This procedure takes approximately one hour. There are no restrictions for this procedure.  - Your physician has recommended that you wear a 24 hour holter monitor. Holter monitors are medical devices that record the heart's electrical activity. Doctors most often use these monitors to diagnose arrhythmias. Arrhythmias are problems with the speed or rhythm of the heartbeat. The monitor is a small, portable device. You can wear one while you do your normal daily activities. This is usually used to diagnose what is causing palpitations/syncope (passing out).   Follow-Up: - Your physician recommends that you schedule a follow-up appointment in: 4-6 weeks with Dr. Caryl Comes.  Any Additional Special Instructions Will Be Listed Below (If Applicable).  [place]  Your caregiver has ordered a Stress Test with nuclear imaging. The purpose of this test is to evaluate the blood supply to your heart muscle. This procedure is referred to as a "Non-Invasive Stress Test." This is because other than having an IV started in your vein, nothing is inserted or "invades" your body. Cardiac stress tests are done to find areas of poor blood flow to the heart by determining the extent of coronary artery disease (CAD). Some  patients exercise on a treadmill, which naturally increases the blood flow to your heart, while others who are  unable to walk on a treadmill due to physical limitations have a pharmacologic/chemical stress agent called Lexiscan . This medicine will mimic walking on a treadmill by temporarily increasing your coronary blood flow.   Please note: these test may take anywhere between 2-4 hours to complete  PLEASE REPORT TO Katie AT THE FIRST DESK WILL DIRECT YOU WHERE TO GO  Date of Procedure:_______Thursday 11/16/17______________________________  Arrival Time for Procedure:________7:15am______________________  Instructions regarding medication:   ____ : Hold diabetes medication morning of procedure  ____:  Hold betablocker(s) night before procedure and morning of procedure  _x___:  Hold other medications as follows: 1)lasix (Furosemide)   PLEASE NOTIFY THE OFFICE AT LEAST 24 HOURS IN ADVANCE IF YOU ARE UNABLE TO KEEP YOUR APPOINTMENT.  [phone] AND  PLEASE NOTIFY NUCLEAR MEDICINE AT [facility] AT LEAST 24 HOURS IN ADVANCE IF YOU ARE UNABLE TO KEEP YOUR APPOINTMENT. [phone]  How to prepare for your Myoview test:  1. Do not eat or drink after midnight 2. No caffeine for 24 hours prior to test 3. No smoking 24 hours prior to test. 4. Your medication may be taken with water.  If your doctor stopped a medication because of this test, do not take that medication. 5. Ladies, please do not wear dresses.  Skirts or pants are appropriate. Please wear a short sleeve shirt. 6. No perfume, cologne or lotion. 7. Wear comfortable walking shoes. No heels!             If you need a refill on your cardiac  medications before your next appointment, please call your pharmacy.

## 2015-11-25 NOTE — Progress Notes (Signed)
Subjective:  Patient ID: Nicholas [name]., male    DOB: 05-04-1945  Age: 70 y.o. MRN: [id number]  CC: Hip Pain (right)  Procedure none Service Provided on Last Visit: Evaluation HPI Nicholas [name]. presents for  Reevaluation. He was last seen a month ago. He is taking his medications as prescribed and doing well with his current regimen. I continue to enable him to perform daily functional activities with improved lifestyle. He denies any significant side effects with the medications. His lower extremity strength and function remains at baseline.  He has reported a slight increase with shortness of breath with exertion and this is under evaluation with his primary care physicians for possible lower extremity blood clots or a cardiac etiology with an echo pending.  Historically, He is a pleasant 70 year old white male with a long-standing history of diffuse pain with severe degenerative arthritis and low back pain. He is referred by Dr. Golden Pop for assistance with his medication management. His pain complaints revolve around chronic low back pain and knee pain and hip pain and pain and shoulder pain. Had multiple surgeries and continues to have diffuse body pain that is currently managed with Vicodin 5 mg twice a day. This regimen seems to help keep his pain under good control and he states that nothing else has helped. The pain has gotten worse over the years with a maximum VAS of 10 at best and 8 does not appear to be influenced by time of day. Aggravating factors include bending kneeling lifting sitting standing squatting and alleviating factors include stretching cold application and medication management. He has associated spasms pain that wakes him up at night fatigue and dizziness and the pain description includes a quality of aching and increasing duration pain. He's had multiple studies to evaluate the nature of his pain and its found to be mainly consistent with osteoarthritis and  degenerative joint disease.   History Renay has a past medical history of Anterior urethral stricture; Anxiety; Arthritis; Bulging lumbar disc; BXO (balanitis xerotica obliterans); Depression; First degree AV block; Gross hematuria; History of blood clots; Hypertension; Mobitz type I Wenckebach atrioventricular block; Phimosis; and Shortness of breath dyspnea.   He has a past surgical history that includes Total hip arthroplasty (Right, 2004); Total hip arthroplasty (Left, 2006); Knee cartilage surgery (Left, 1965); Knee arthroscopy (Right, 2013); Cataract extraction w/ intraocular lens  implant, bilateral (Bilateral, ~ 2010); Pilonidal cyst excision (1970's); Cardiac catheterization (~ 2005); Left Total Knee Arthroplasty; Hammer toe surgery (Bilateral, 01/06/2015); Bunionectomy (Bilateral, 01/06/2015); and Joint replacement (Bilateral).   His family history includes Alcoholism in his mother; Cancer in his mother and sister; Diabetes in his father; Hypertension in his father; Prostate cancer in his paternal grandfather; Stroke in his father.He reports that he has never smoked. He has never used smokeless tobacco. He reports that he drinks alcohol. He reports that he does not use drugs.  No results found for this or any previous visit.  [employer]ox[employer] Select 13  Date Value Ref Range Status  06/30/2015 FINAL  Final    Comment:    ==================================================================== TOXASSURE SELECT 13 (MW) ==================================================================== Test                             Result       Flag       Units Drug Present and Declared for Prescription Verification   Lorazepam  418          EXPECTED   ng/mg creat    Source of lorazepam is a scheduled prescription medication.   Hydrocodone                    35           EXPECTED   ng/mg creat   Norhydrocodone                 114          EXPECTED   ng/mg creat    Sources of hydrocodone  include scheduled prescription    medications. Norhydrocodone is an expected metabolite of    hydrocodone. ==================================================================== Test                      Result    Flag   Units      Ref Range   Creatinine              200              mg/dL      >=20 ==================================================================== Declared Medications:  The flagging and interpretation on this report are based on the  following declared medications.  Unexpected results may arise from  inaccuracies in the declared medications.  **Note: The testing scope of this panel includes these medications:  Hydrocodone (Hydrocodone-Acetaminophen)  Lorazepam  **Note: The testing scope of this panel does not include following  reported medications:  Acetaminophen (Hydrocodone-Acetaminophen)  Apixaban  Cyanocobalamin  Gabapentin  Iron  Multivitamin  Omega-3 Fatty Acids (Fish Oil)  Oxymetazoline  Quetiapine  Simvastatin  Solifenacin  Venlafaxine  Vitamin C ==================================================================== For clinical consultation, please call [phone]. ====================================================================       ---------------------------------------------------------------------------------------------------------------------- Past Medical History:  Diagnosis Date  . Anterior urethral stricture   . Anxiety   . Arthritis    a. knees, hips, hands;  b. 11/2013 s/p L TKA @ [place].  . Bulging lumbar disc   . BXO (balanitis xerotica obliterans)   . Depression   . First degree AV block   . Gross hematuria   . History of blood clots   . Hypertension    borderline  . Mobitz type I Wenckebach atrioventricular block   . Phimosis   . Shortness of breath dyspnea    ON EXERTION    Past Surgical History:  Procedure Laterality Date  . BUNIONECTOMY Bilateral 01/06/2015   Procedure: BUNIONECTOMY;  Surgeon: [name], MD;  Location: [facility] ORS;  Service: Orthopedics;  Laterality: Bilateral;  . CARDIAC CATHETERIZATION  ~ 2005   "once"  . CATARACT EXTRACTION W/ INTRAOCULAR LENS  IMPLANT, BILATERAL Bilateral ~ 2010  . HAMMER TOE SURGERY Bilateral 01/06/2015   Procedure: HAMMER TOE CORRECTION;  Surgeon: [name], MD;  Location: [facility] ORS;  Service: Orthopedics;  Laterality: Bilateral;  . JOINT REPLACEMENT Bilateral    hips and knees  . KNEE ARTHROSCOPY Right 2013  . KNEE CARTILAGE SURGERY Left 1965   "football injury"  . Left Total Knee Arthroplasty     a. 11/2013 [facility].  Marland Kitchen PILONIDAL CYST EXCISION  1970's  . TOTAL HIP ARTHROPLASTY Right 2004  . TOTAL HIP ARTHROPLASTY Left 2006    Family History  Problem Relation Age of Onset  . Stroke Father     deceased  . Diabetes Father   . Hypertension Father   . Alcoholism Mother     died in her 54's.  Marland Kitchen  Cancer Mother   . Cancer Sister   . Prostate cancer Paternal Grandfather     Social History  Substance Use Topics  . Smoking status: Never Smoker  . Smokeless tobacco: Never Used  . Alcohol use 0.0 oz/week     Comment: 2 DRINKS/MONTH    ---------------------------------------------------------------------------------------------------------------------- Social History   Social History  . Marital status: Married    Spouse name: N/A  . Number of children: N/A  . Years of education: N/A   Social History Main Topics  . Smoking status: Never Smoker  . Smokeless tobacco: Never Used  . Alcohol use 0.0 oz/week     Comment: 2 DRINKS/MONTH  . Drug use: No  . Sexual activity: Not Currently   Other Topics Concern  . None   Social History Narrative   Lives in [place] with wife.  Does not routinely exercise.  Activity severely limited by bilateral knee pain.      ----------------------------------------------------------------------------------------------------------------------  ROS Review of Systems  No changes are mentioned   Objective:  BP (!) 141/70   Pulse (!) 56   Temp 98.3 F (36.8 C) (Oral)   Resp 18   Ht 5\' 11"  (1.803 m)   Wt 297 lb (134.7 kg)   SpO2 97%   BMI 41.42 kg/m   Physical Exam  No change on physical exam is noted. Heart is regular rate and rhythm Lungs are clear to also dictation Strength in the lower extremities is without changeAnd his exam appears stable  Previous MRI has revealed diffuse facet and degenerative disc disease throughout the lumbar spine.     Assessment & Plan:   Eladio was seen today for hip pain.  Diagnoses and all orders for this visit:  Primary osteoarthritis of left hip  Osteoarthritis of right hip, unspecified osteoarthritis type  Primary osteoarthritis of both knees  Facet arthritis of lumbosacral region ([place])  DDD (degenerative disc disease), lumbar  Other orders -     Discontinue: HYDROcodone-acetaminophen (NORCO/VICODIN) 5-325 MG tablet; Take 1 tablet by mouth 2 (two) times daily as needed. -     HYDROcodone-acetaminophen (NORCO/VICODIN) 5-325 MG tablet; Take 1 tablet by mouth 2 (two) times daily as needed.     ----------------------------------------------------------------------------------------------------------------------  Problem List Items Addressed This Visit    None    Visit Diagnoses    Primary osteoarthritis of left hip    -  Primary   Relevant Medications   HYDROcodone-acetaminophen (NORCO/VICODIN) 5-325 MG tablet   Osteoarthritis of right hip, unspecified osteoarthritis type       Relevant Medications   HYDROcodone-acetaminophen (NORCO/VICODIN) 5-325 MG tablet   Primary osteoarthritis of both knees       Facet arthritis of lumbosacral region ([place])       Relevant Medications   HYDROcodone-acetaminophen (NORCO/VICODIN) 5-325 MG tablet   DDD (degenerative disc disease), lumbar       Relevant Medications   HYDROcodone-acetaminophen (NORCO/VICODIN) 5-325 MG tablet       ----------------------------------------------------------------------------------------------------------------------  1. DDD (degenerative disc disease), lumbar Still refusing to consider epidural steroid administration.. -2. Facet arthritis of lumbosacral region.. I have encouraged him to continue with back stretching strengthening exercises and core strengthening. We will refill his medications today with return to clinic in 2 months. 3. Primary osteoarthritis of both knees 4. Osteoarthritis of right hip, unspecified osteoarthritis type  -5. Primary osteoarthritis of left hip 6. Shortness of breath and dyspnea on exertion undercurrent follow-up with his primary care physicians  -  ----------------------------------------------------------------------------------------------------------------------  I am having Mr. Lonia Skinner  maintain his multivitamin, Fish Oil-Cholecalciferol (FISH OIL + D3 PO), oxymetazoline, B-12, ferrous sulfate, vitamin C, apixaban, gabapentin, QUEtiapine Fumarate, simvastatin, solifenacin, venlafaxine XR, CRANBERRY CONCENTRATE PO, pantoprazole, hyoscyamine, LORazepam, furosemide, and HYDROcodone-acetaminophen.   Meds ordered this encounter  Medications  . DISCONTD: HYDROcodone-acetaminophen (NORCO/VICODIN) 5-325 MG tablet    Sig: Take 1 tablet by mouth 2 (two) times daily as needed.    Dispense:  60 tablet    Refill:  0    Do not fill until [id number]  . HYDROcodone-acetaminophen (NORCO/VICODIN) 5-325 MG tablet    Sig: Take 1 tablet by mouth 2 (two) times daily as needed.    Dispense:  60 tablet    Refill:  0    Do not fill until [id number]       Follow-up: Return in about 2 months (around 01/25/2016).    [name], MD  This dictation was performed utilizing Dragon voice recognition software.  Please excuse any unintentional or mistaken typographical errors as a result of its unedited utilization.

## 2015-11-30 DIAGNOSIS — H26493 Other secondary cataract, bilateral: Secondary | ICD-10-CM | POA: Diagnosis not present

## 2015-11-30 DIAGNOSIS — Z961 Presence of intraocular lens: Secondary | ICD-10-CM | POA: Diagnosis not present

## 2015-12-01 IMAGING — DX DG CHEST 1V PORT
1 series · 1 of 1 positions shown · non-contrast
Comparison: Portable chest x-ray of 04/07/2013

CLINICAL DATA: Pulmonary emboli, shortness of breath, followup

EXAM:
PORTABLE CHEST - 1 VIEW

[portable]
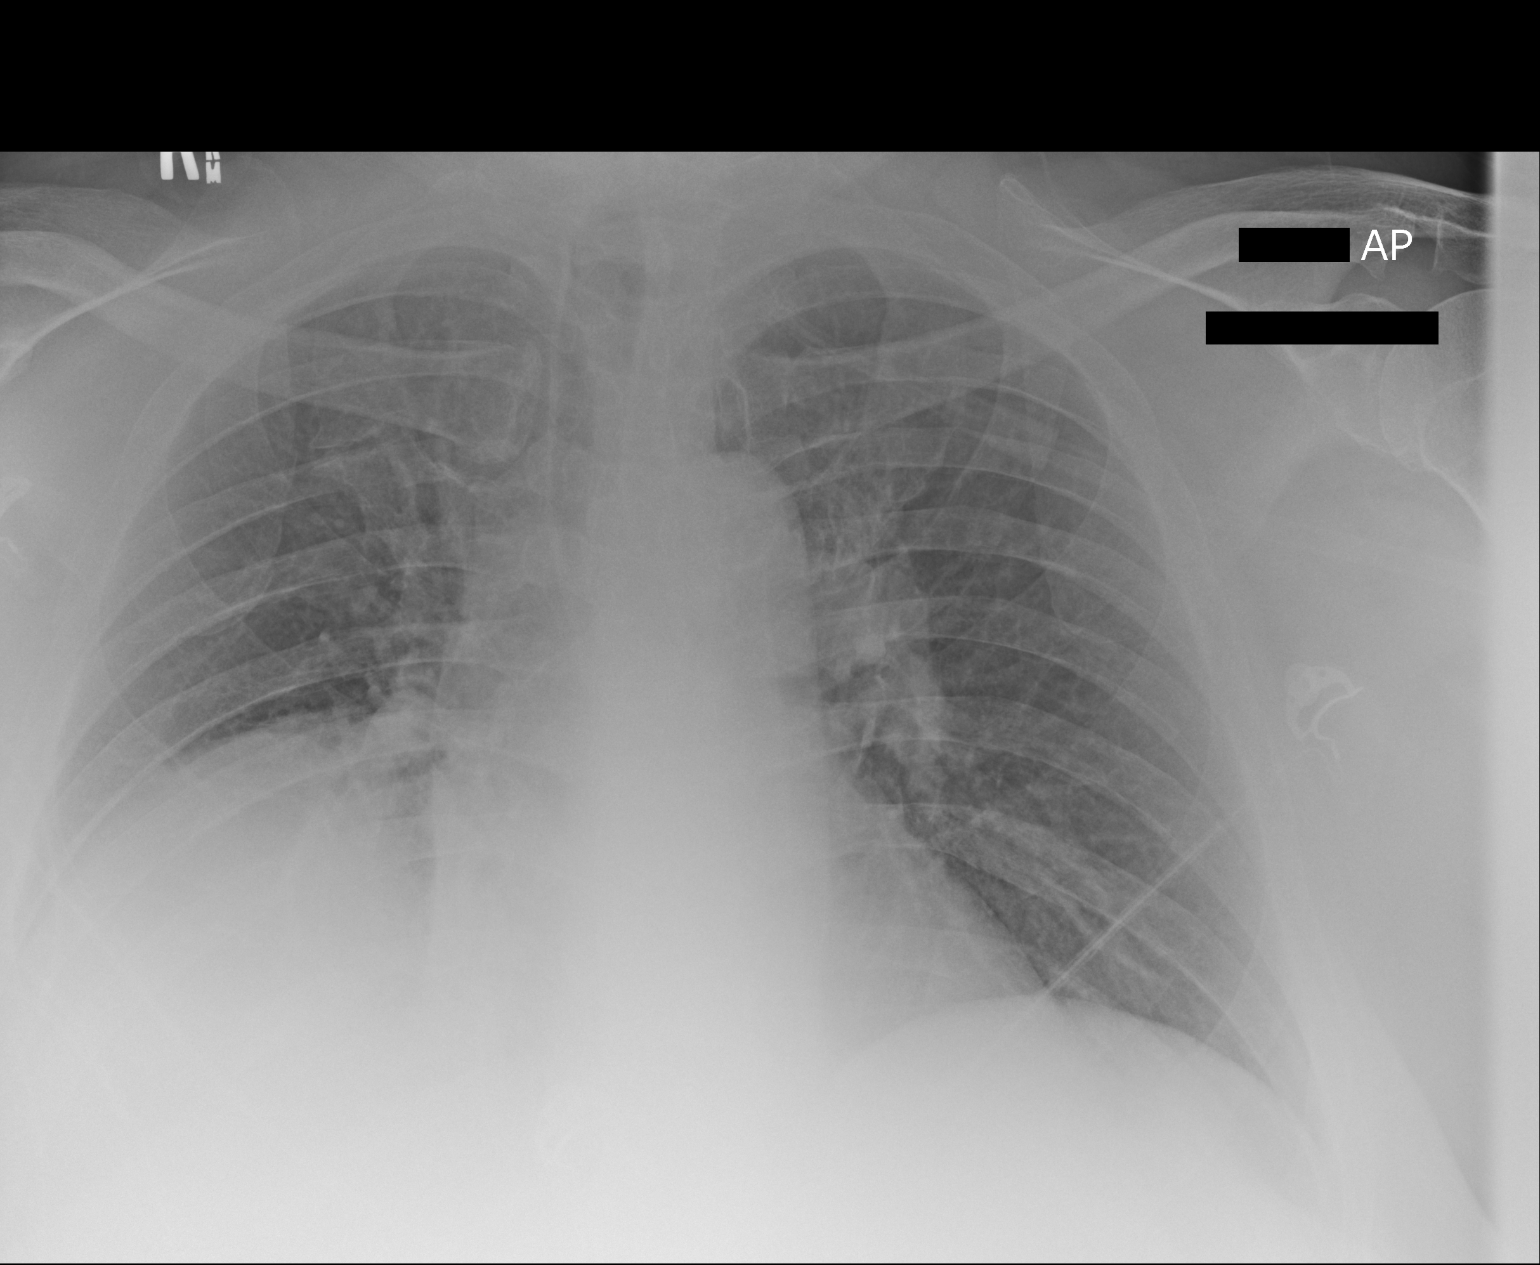

[1 of 1 positions shown; findings below may reference images not displayed]

FINDINGS: There is slightly more haziness at the right lung base consistent
with atelectasis with probable chronic elevation of the right
hemidiaphragm. The left lung is clear. Heart size is stable.
IMPRESSION: Persistent elevation of the left hemidiaphragm with mild right
basilar volume loss.

## 2015-12-06 ENCOUNTER — Encounter: Payer: Self-pay | Admitting: *Deleted

## 2015-12-07 ENCOUNTER — Ambulatory Visit
Admission: RE | Admit: 2015-12-07 | Discharge: 2015-12-07 | Disposition: A | Discharge status: Home / Self Care | Payer: BLUE CROSS/BLUE SHIELD | Source: Ambulatory Visit | Attending: Gastroenterology | Admitting: Gastroenterology

## 2015-12-07 ENCOUNTER — Encounter: Payer: Self-pay | Admitting: *Deleted

## 2015-12-07 ENCOUNTER — Ambulatory Visit: Payer: BLUE CROSS/BLUE SHIELD | Admitting: Anesthesiology

## 2015-12-07 ENCOUNTER — Encounter
Admission: RE | Disposition: A | Discharge status: Home / Self Care | Payer: Self-pay | Source: Ambulatory Visit | Attending: Gastroenterology

## 2015-12-07 DIAGNOSIS — Z6841 Body Mass Index (BMI) 40.0 and over, adult: Secondary | ICD-10-CM | POA: Diagnosis not present

## 2015-12-07 DIAGNOSIS — R112 Nausea with vomiting, unspecified: Secondary | ICD-10-CM | POA: Diagnosis not present

## 2015-12-07 DIAGNOSIS — I2782 Chronic pulmonary embolism: Secondary | ICD-10-CM | POA: Insufficient documentation

## 2015-12-07 DIAGNOSIS — Z7901 Long term (current) use of anticoagulants: Secondary | ICD-10-CM | POA: Insufficient documentation

## 2015-12-07 DIAGNOSIS — E785 Hyperlipidemia, unspecified: Secondary | ICD-10-CM | POA: Diagnosis not present

## 2015-12-07 DIAGNOSIS — K219 Gastro-esophageal reflux disease without esophagitis: Secondary | ICD-10-CM | POA: Diagnosis not present

## 2015-12-07 DIAGNOSIS — Z1211 Encounter for screening for malignant neoplasm of colon: Secondary | ICD-10-CM | POA: Diagnosis not present

## 2015-12-07 DIAGNOSIS — F329 Major depressive disorder, single episode, unspecified: Secondary | ICD-10-CM | POA: Diagnosis not present

## 2015-12-07 DIAGNOSIS — K2210 Ulcer of esophagus without bleeding: Secondary | ICD-10-CM | POA: Insufficient documentation

## 2015-12-07 DIAGNOSIS — F419 Anxiety disorder, unspecified: Secondary | ICD-10-CM | POA: Diagnosis not present

## 2015-12-07 DIAGNOSIS — Q438 Other specified congenital malformations of intestine: Secondary | ICD-10-CM | POA: Diagnosis not present

## 2015-12-07 DIAGNOSIS — K2960 Other gastritis without bleeding: Secondary | ICD-10-CM | POA: Diagnosis not present

## 2015-12-07 DIAGNOSIS — K2990 Gastroduodenitis, unspecified, without bleeding: Secondary | ICD-10-CM | POA: Diagnosis not present

## 2015-12-07 DIAGNOSIS — I10 Essential (primary) hypertension: Secondary | ICD-10-CM | POA: Insufficient documentation

## 2015-12-07 DIAGNOSIS — K640 First degree hemorrhoids: Secondary | ICD-10-CM | POA: Diagnosis not present

## 2015-12-07 DIAGNOSIS — R1310 Dysphagia, unspecified: Secondary | ICD-10-CM | POA: Diagnosis not present

## 2015-12-07 DIAGNOSIS — K2970 Gastritis, unspecified, without bleeding: Secondary | ICD-10-CM | POA: Insufficient documentation

## 2015-12-07 DIAGNOSIS — R0602 Shortness of breath: Secondary | ICD-10-CM | POA: Diagnosis not present

## 2015-12-07 DIAGNOSIS — K208 Other esophagitis: Secondary | ICD-10-CM | POA: Diagnosis not present

## 2015-12-07 DIAGNOSIS — K209 Esophagitis, unspecified: Secondary | ICD-10-CM | POA: Diagnosis not present

## 2015-12-07 HISTORY — PX: COLONOSCOPY WITH PROPOFOL: SHX5780

## 2015-12-07 HISTORY — PX: ESOPHAGOGASTRODUODENOSCOPY (EGD) WITH PROPOFOL: SHX5813

## 2015-12-07 HISTORY — DX: Hyperlipidemia, unspecified: E78.5

## 2015-12-07 SURGERY — ESOPHAGOGASTRODUODENOSCOPY (EGD) WITH PROPOFOL
Anesthesia: General

## 2015-12-07 MED ORDER — EPHEDRINE SULFATE 50 MG/ML IJ SOLN
INTRAMUSCULAR | Status: DC | PRN
  Administered 2015-12-07 (×2): 5 mg via INTRAVENOUS
  Administered 2015-12-07 (×2): 10 mg via INTRAVENOUS

## 2015-12-07 MED ORDER — MIDAZOLAM HCL 2 MG/2ML IJ SOLN
INTRAMUSCULAR | Status: DC | PRN
  Administered 2015-12-07: 2 mg via INTRAVENOUS

## 2015-12-07 MED ORDER — SODIUM CHLORIDE 0.9 % IV SOLN: INTRAVENOUS | Status: DC

## 2015-12-07 MED ORDER — LIDOCAINE HCL (CARDIAC) 20 MG/ML IV SOLN
INTRAVENOUS | Status: DC | PRN
  Administered 2015-12-07: 30 mg via INTRAVENOUS

## 2015-12-07 MED ORDER — SODIUM CHLORIDE 0.9 % IV SOLN
INTRAVENOUS | Status: DC
  Administered 2015-12-07: 16:00:00 via INTRAVENOUS
  Administered 2015-12-07: 1000 mL via INTRAVENOUS

## 2015-12-07 MED ORDER — PROPOFOL 500 MG/50ML IV EMUL
INTRAVENOUS | Status: DC | PRN
  Administered 2015-12-07: 120 ug/kg/min via INTRAVENOUS

## 2015-12-07 MED ORDER — SODIUM CHLORIDE 0.9 % IV SOLN
INTRAVENOUS | Status: AC
  Administered 2015-12-07: 1 g
  Filled 2015-12-07 (×2): qty 1000

## 2015-12-07 MED ORDER — FENTANYL CITRATE (PF) 100 MCG/2ML IJ SOLN
INTRAMUSCULAR | Status: DC | PRN
  Administered 2015-12-07: 50 ug via INTRAVENOUS

## 2015-12-07 NOTE — Anesthesia Preprocedure Evaluation (Signed)
Anesthesia Evaluation  Patient identified by MRN, date of birth, ID band Patient awake    Reviewed: Allergy & Precautions, NPO status , Patient's Chart, lab work & pertinent test results  Airway Mallampati: II       Dental  (+) Teeth Intact   Pulmonary shortness of breath and with exertion,     + decreased breath sounds      Cardiovascular Exercise Tolerance: Good hypertension, Pt. on medications  Rhythm:Regular Rate:Normal     Neuro/Psych Anxiety Depression    GI/Hepatic Neg liver ROS, GERD  Medicated,  Endo/Other  Morbid obesity  Renal/GU negative Renal ROS     Musculoskeletal negative musculoskeletal ROS (+)   Abdominal (+) + obese,   Peds  Hematology negative hematology ROS (+)   Anesthesia Other Findings   Reproductive/Obstetrics                             Anesthesia Physical Anesthesia Plan  ASA: III  Anesthesia Plan: General   Post-op Pain Management:    Induction: Intravenous  Airway Management Planned: Natural Airway and Nasal Cannula  Additional Equipment:   Intra-op Plan:   Post-operative Plan:   Informed Consent: I have reviewed the patients History and Physical, chart, labs and discussed the procedure including the risks, benefits and alternatives for the proposed anesthesia with the patient or authorized representative who has indicated his/her understanding and acceptance.     Plan Discussed with: CRNA  Anesthesia Plan Comments:         Anesthesia Quick Evaluation

## 2015-12-07 NOTE — Op Note (Signed)
[facility] Gastroenterology Patient Name: Nicholas [name] Procedure Date: 12/07/2015 2:17 PM MRN: [id number] Account #: [id number] Date of Birth: [birth year] Admit Type: Outpatient Age: 70 Room: [facility] ENDO ROOM 3 Gender: Male Note Status: Finalized Procedure:            Upper GI endoscopy Indications:          Dysphagia, Nausea with vomiting Providers:            Lollie Sails, MD Referring MD:         [name], MD (Referring MD) Medicines:            Monitored Anesthesia Care Complications:        No immediate complications. Procedure:            Pre-Anesthesia Assessment:                       - ASA Grade Assessment: III - A patient with severe                        systemic disease.                       After obtaining informed consent, the endoscope was                        passed under direct vision. Throughout the procedure,                        the patient's blood pressure, pulse, and oxygen                        saturations were monitored continuously. The Endoscope                        was introduced through the mouth, and advanced to the                        third part of duodenum. The patient tolerated the                        procedure well. Findings:      LA Grade A (one or more mucosal breaks less than 5 mm, not extending       between tops of 2 mucosal folds) esophagitis with no bleeding was found.       Biopsies were taken with a cold forceps for histology.      No endoscopic abnormality was evident in the esophagus to explain the       patient's complaint of dysphagia. It was decided, however, to proceed       with dilation of the lower third of the esophagus, where a ring had been       noted on barium swallow. A TTS dilator was passed through the scope.       Dilation with a 12-13.5-15 mm balloon dilator was performed to 15 mm. It       is of note that there is a relatively angulated turn at the borrom of       the  esophagus prior to gastric entry.      Diffuse mild inflammation characterized by congestion (edema) and       erythema was found in  the gastric body. Biopsies were taken with a cold       forceps for histology.      Patchy mild mucosal variance characterized by smoothness was found in       the second portion of the duodenum and in the third portion of the       duodenum. Biopsies were taken with a cold forceps for histology.      Patchy mild inflammation characterized by congestion (edema) and       erythema was found in the duodenal bulb. Impression:           - LA Grade A erosive esophagitis. Biopsied.                       - No endoscopic esophageal abnormality to explain                        patient's dysphagia. Esophagus dilated. Dilated.                       - Bile gastritis. Biopsied.                       - Mucosal variant in the duodenum. Biopsied. Recommendation:       - Perform a colonoscopy today.                       - Use Protonix (pantoprazole) 40 mg PO daily daily.                       - Use sucralfate tablets 1 gram PO QID for 4 weeks. Procedure Code(s):    --- Professional ---                       [phone], Esophagogastroduodenoscopy, flexible, transoral;                        with transendoscopic balloon dilation of esophagus                        (less than 30 mm diameter)                       [id number], Esophagogastroduodenoscopy, flexible, transoral;                        with biopsy, single or multiple Diagnosis Code(s):    --- Professional ---                       K20.8, Other esophagitis                       R13.10, Dysphagia, unspecified                       K29.60, Other gastritis without bleeding                       K31.89, Other diseases of stomach and duodenum                       R11.2, Nausea with vomiting, unspecified CPT copyright 2016 American Medical Association. All rights reserved. The codes documented in this  report are preliminary and  upon coder review may  be revised to meet current compliance requirements. Lollie Sails, MD 12/07/2015 2:52:51 PM This report has been signed electronically. Number of Addenda: 0 Note Initiated On: 12/07/2015 2:17 PM      [facility]

## 2015-12-07 NOTE — Anesthesia Procedure Notes (Signed)
Performed by: Vaughan Sine Pre-anesthesia Checklist: Patient identified, Emergency Drugs available, Suction available, Patient being monitored and Timeout performed Patient Re-evaluated:Patient Re-evaluated prior to inductionOxygen Delivery Method: Simple face mask Preoxygenation: Pre-oxygenation with 100% oxygen Intubation Type: IV induction Airway Equipment and Method: Bite block Placement Confirmation: CO2 detector and positive ETCO2

## 2015-12-07 NOTE — Transfer of Care (Signed)
Immediate Anesthesia Transfer of Care Note  Patient: Nicholas [name].  Procedure(s) Performed: Procedure(s): ESOPHAGOGASTRODUODENOSCOPY (EGD) WITH PROPOFOL (N/A) COLONOSCOPY WITH PROPOFOL (N/A)  Patient Location: PACU  Anesthesia Type:MAC  Level of Consciousness: awake and sedated  Airway & Oxygen Therapy: Patient Spontanous Breathing and Patient connected to face mask oxygen  Post-op Assessment: Report given to RN and Post -op Vital signs reviewed and stable  Post vital signs: Reviewed and stable  Last Vitals:  Vitals:   12/07/15 1310  BP: (!) 147/85  Pulse: 77  Resp: 18  Temp: 36.7 C    Last Pain:  Vitals:   12/07/15 1310  TempSrc: Tympanic         Complications: No apparent anesthesia complications

## 2015-12-07 NOTE — Anesthesia Postprocedure Evaluation (Signed)
Anesthesia Post Note  Patient: Nicholas [name].  Procedure(s) Performed: Procedure(s) (LRB): ESOPHAGOGASTRODUODENOSCOPY (EGD) WITH PROPOFOL (N/A) COLONOSCOPY WITH PROPOFOL (N/A)  Patient location during evaluation: Endoscopy Anesthesia Type: General Level of consciousness: awake and alert Pain management: pain level controlled Vital Signs Assessment: post-procedure vital signs reviewed and stable Respiratory status: spontaneous breathing and respiratory function stable Cardiovascular status: stable Anesthetic complications: no    Last Vitals:  Vitals:   12/07/15 1555 12/07/15 1605  BP: (!) 148/73 (!) 147/77  Pulse: 71 60  Resp: 16 14  Temp:      Last Pain:  Vitals:   12/07/15 1535  TempSrc: Tympanic                 Laterrica Libman K

## 2015-12-07 NOTE — Op Note (Signed)
[facility] Gastroenterology Patient Name: Nicholas [name] Procedure Date: 12/07/2015 2:16 PM MRN: [id number] Account #: [id number] Date of Birth: 12-Dec-1945 Admit Type: Outpatient Age: 70 Room: [facility] ENDO ROOM 3 Gender: Male Note Status: Finalized Procedure:            Colonoscopy Indications:          Screening for colorectal malignant neoplasm Providers:            Lollie Sails, MD Referring MD:         [name], MD (Referring MD) Medicines:            Monitored Anesthesia Care Complications:        No immediate complications. Procedure:            Pre-Anesthesia Assessment:                       - ASA Grade Assessment: III - A patient with severe                        systemic disease.                       After obtaining informed consent, the colonoscope was                        passed under direct vision. Throughout the procedure,                        the patient's blood pressure, pulse, and oxygen                        saturations were monitored continuously. The                        Colonoscope was introduced through the anus and                        advanced to the the cecum, identified by appendiceal                        orifice and ileocecal valve. The colonoscopy was                        performed with moderate difficulty due to poor bowel                        prep and a tortuous colon. Successful completion of the                        procedure was aided by using manual pressure and                        lavage. The quality of the bowel preparation was fair. Findings:      Non-bleeding internal hemorrhoids were found during retroflexion and       during anoscopy. The hemorrhoids were small and Grade I (internal       hemorrhoids that do not prolapse). Prominant anal pillar noted.      The sigmoid colon, descending colon and transverse colon were       significantly redundant.  Biopsies for histology were taken with a  cold forceps from the right       colon and left colon for evaluation of microscopic colitis. Impression:           - Preparation of the colon was fair.                       - Non-bleeding internal hemorrhoids.                       - Redundant colon.                       - Biopsies were taken with a cold forceps from the                        right colon and left colon for evaluation of                        microscopic colitis. Recommendation:       - Soft diet today, then advance as tolerated to advance                        diet as tolerated.                       - Await pathology results.                       - Return to GI clinic in 4 weeks. Procedure Code(s):    --- Professional ---                       [phone], Colonoscopy, flexible; with biopsy, single or                        multiple Diagnosis Code(s):    --- Professional ---                       Z12.11, Encounter for screening for malignant neoplasm                        of colon                       K64.0, First degree hemorrhoids                       Q43.8, Other specified congenital malformations of                        intestine CPT copyright 2016 American Medical Association. All rights reserved. The codes documented in this report are preliminary and upon coder review may  be revised to meet current compliance requirements. Lollie Sails, MD 12/07/2015 3:36:00 PM This report has been signed electronically. Number of Addenda: 0 Note Initiated On: 12/07/2015 2:16 PM Scope Withdrawal Time: 0 hours 14 minutes 15 seconds  Total Procedure Duration: 0 hours 35 minutes 15 seconds       [facility]

## 2015-12-07 NOTE — H&P (Signed)
Outpatient short stay form Pre-procedure 12/07/2015 1:58 PM Nicholas Sails MD  Primary Physician: Dr. Golden Pop  Reason for visit:  EGD and colonoscopy  History of present illness:  Patient is a 70 year old male presenting today as a follow-up. He tolerated his prep well. Patient does take L a course which he is held for 3 days. Has complaint of some difficulty with swallowing. A barium swallow noted a small B ring in the distal esophagus. The tablet did pass normally however he is symptomatic. He was only recently started on a proton pump inhibitor. He also has some intermittent episodic events of nausea and vomiting with some diarrhea and abdominal cramping. Once the episode is completed with some diarrhea the abdominal pain goes away. This is mostly left lower quadrant. Is been over 10 years since his last colonoscopy.  Patient has had 2 knee replacements within the past 3 years and we will plan to give him antibiotics for his case.    Current Facility-Administered Medications:  .  0.9 %  sodium chloride infusion, , Intravenous, Continuous, Nicholas Sails, MD, Last Rate: 20 mL/hr at 12/07/15 1334, 1,000 mL at 12/07/15 1334 .  0.9 %  sodium chloride infusion, , Intravenous, Continuous, Nicholas Sails, MD .  sodium chloride 0.9 % with ampicillin (OMNIPEN) ADS Med, , , ,   Prescriptions Prior to Admission  Medication Sig Dispense Refill Last Dose  . apixaban (ELIQUIS) 5 MG TABS tablet Take 1 tablet (5 mg total) by mouth 2 (two) times daily. 180 tablet 4 Past Week at Unknown time  . CRANBERRY CONCENTRATE PO Take 1 tablet by mouth daily.    Past Week at Unknown time  . Cyanocobalamin (B-12) 1000 MCG CAPS Take 1 tablet by mouth daily.    Past Week at Unknown time  . ferrous sulfate 325 (65 FE) MG tablet Take 325 mg by mouth daily with breakfast.   Past Week at Unknown time  . Fish Oil-Cholecalciferol (FISH OIL + D3 PO) Take 1,200 mg by mouth daily. AM   Past Week at Unknown time   . furosemide (LASIX) 20 MG tablet Take one tablet (20 mg) daily on Tuesday/ Thursday/ Saturday/ sunday 30 tablet 2 12/07/2015 at 0900  . gabapentin (NEURONTIN) 400 MG capsule Take 1 capsule (400 mg total) by mouth 3 (three) times daily. 270 capsule 4 12/06/2015 at Unknown time  . HYDROcodone-acetaminophen (NORCO/VICODIN) 5-325 MG tablet Take 1 tablet by mouth 2 (two) times daily as needed. 60 tablet 0 12/06/2015 at Unknown time  . hyoscyamine (LEVSIN, ANASPAZ) 0.125 MG tablet Take 0.125 mg by mouth every 6 (six) hours as needed.   12/06/2015 at Unknown time  . LORazepam (ATIVAN) 1 MG tablet TAKE ONE TABLET BY MOUTH AT BEDTIME 30 tablet 0 12/06/2015 at Unknown time  . oxymetazoline (AFRIN) 0.05 % nasal spray Place 2 sprays into both nostrils at bedtime as needed for congestion.   Past Week at Unknown time  . pantoprazole (PROTONIX) 40 MG tablet Take 40 mg by mouth daily.   12/07/2015 at 0900  . simvastatin (ZOCOR) 20 MG tablet Take 1 tablet (20 mg total) by mouth daily. 90 tablet 4 12/06/2015 at Unknown time  . solifenacin (VESICARE) 10 MG tablet Take 1 tablet (10 mg total) by mouth daily. 90 tablet 4 12/06/2015 at Unknown time  . venlafaxine XR (EFFEXOR XR) 75 MG 24 hr capsule Take 1 capsule (75 mg total) by mouth daily with breakfast. 3 a day 270 capsule 4 12/07/2015 at  0900  . vitamin C (ASCORBIC ACID) 250 MG tablet Take 250 mg by mouth daily.   Past Week at Unknown time  . Multiple Vitamin (MULTIVITAMIN) tablet Take 2 tablets by mouth daily. GUMMIES   Taking  . QUEtiapine Fumarate (SEROQUEL XR) 150 MG 24 hr tablet Take 1 tablet (150 mg total) by mouth at bedtime. 90 tablet 4 Taking     No Known Allergies   Past Medical History:  Diagnosis Date  . Anterior urethral stricture   . Anxiety   . Arthritis    a. knees, hips, hands;  b. 11/2013 s/p L TKA @ [place].  . Bulging lumbar disc   . BXO (balanitis xerotica obliterans)   . Depression   . First degree AV block   . Gross hematuria   .  History of blood clots   . Hyperlipemia   . Hypertension    borderline  . Mobitz type I Wenckebach atrioventricular block   . Phimosis   . Pulmonary embolism ([place])   . Shortness of breath dyspnea    ON EXERTION    Review of systems:      Physical Exam    Heart and lungs: Regular rate and rhythm without rub or gallop, lungs are bilaterally clear.    HEENT: Norm cephalic atraumatic eyes are anicteric    Other:     Pertinant exam for procedure: Soft, nontender nondistended bowel sounds positive normoactive.    Planned proceedures: EGD colonoscopy and indicated procedures. I have discussed the risks benefits and complications of procedures to include not limited to bleeding, infection, perforation and the risk of sedation and the patient wishes to proceed.    Nicholas Sails, MD Gastroenterology 12/07/2015  1:58 PM

## 2015-12-08 ENCOUNTER — Encounter: Payer: Self-pay | Admitting: Gastroenterology

## 2015-12-09 ENCOUNTER — Ambulatory Visit
Admission: RE | Admit: 2015-12-09 | Discharge: 2015-12-09 | Disposition: A | Discharge status: Home / Self Care | Payer: BLUE CROSS/BLUE SHIELD | Source: Ambulatory Visit | Attending: Internal Medicine | Admitting: Internal Medicine

## 2015-12-09 DIAGNOSIS — R0602 Shortness of breath: Secondary | ICD-10-CM

## 2015-12-11 LAB — SURGICAL PATHOLOGY

## 2015-12-14 ENCOUNTER — Ambulatory Visit
Admission: RE | Admit: 2015-12-14 | Discharge: 2015-12-14 | Disposition: A | Discharge status: Home / Self Care | Payer: BLUE CROSS/BLUE SHIELD | Source: Ambulatory Visit | Attending: Internal Medicine | Admitting: Internal Medicine

## 2015-12-14 DIAGNOSIS — R0602 Shortness of breath: Secondary | ICD-10-CM | POA: Diagnosis not present

## 2015-12-14 LAB — NM MYOCAR MULTI W/SPECT W/WALL MOTION / EF
CHL CUP NUCLEAR SSS: 2
CHL CUP RESTING HR STRESS: 69 {beats}/min
CHL CUP STRESS STAGE 2 HR: 70 {beats}/min
CHL CUP STRESS STAGE 3 HR: 67 {beats}/min
CHL CUP STRESS STAGE 4 SPEED: 0 mph
CHL CUP STRESS STAGE 5 GRADE: 0 %
CHL CUP STRESS STAGE 5 SPEED: 0 mph
CHL CUP STRESS STAGE 6 HR: 75 {beats}/min
CHL CUP STRESS STAGE 7 SPEED: 0 mph
CHL CUP STRESS STAGE 8 GRADE: 0 %
CHL CUP STRESS STAGE 8 HR: 63 {beats}/min
CHL CUP STRESS STAGE 8 SBP: 160 mmHg
CSEPHR: 62 %
CSEPPHR: 75 {beats}/min
CSEPPMHR: 50 %
Estimated workload: 1 METS
LV dias vol: 120 mL (ref 62–150)
LVSYSVOL: 43 mL
NUC STRESS TID: 1.15
SDS: 0
SRS: 16
Stage 1 HR: 70 {beats}/min
Stage 3 Grade: 0 %
Stage 3 Speed: 0 mph
Stage 4 Grade: 0 %
Stage 4 HR: 72 {beats}/min
Stage 5 HR: 71 {beats}/min
Stage 6 Grade: 10 %
Stage 6 Speed: 0 mph
Stage 7 Grade: 0 %
Stage 7 HR: 66 {beats}/min
Stage 8 DBP: 70 mmHg
Stage 8 Speed: 0 mph

## 2015-12-14 MED ORDER — REGADENOSON 0.4 MG/5ML IV SOLN
0.4000 mg | Freq: Once | INTRAVENOUS | Status: AC
  Administered 2015-12-14: 0.4 mg via INTRAVENOUS

## 2015-12-14 MED ORDER — TECHNETIUM TC 99M TETROFOSMIN IV KIT
12.3900 | PACK | Freq: Once | INTRAVENOUS | Status: AC | PRN
  Administered 2015-12-14: 12.39 via INTRAVENOUS

## 2015-12-14 MED ORDER — TECHNETIUM TC 99M TETROFOSMIN IV KIT
32.3100 | PACK | Freq: Once | INTRAVENOUS | Status: AC | PRN
  Administered 2015-12-14: 32.31 via INTRAVENOUS

## 2015-12-22 ENCOUNTER — Other Ambulatory Visit: Payer: Self-pay | Admitting: Family Medicine

## 2015-12-23 ENCOUNTER — Ambulatory Visit (INDEPENDENT_AMBULATORY_CARE_PROVIDER_SITE_OTHER): Payer: BLUE CROSS/BLUE SHIELD

## 2015-12-23 ENCOUNTER — Other Ambulatory Visit: Payer: Self-pay

## 2015-12-23 DIAGNOSIS — R0602 Shortness of breath: Secondary | ICD-10-CM

## 2015-12-23 NOTE — Telephone Encounter (Signed)
Your patient 

## 2015-12-30 ENCOUNTER — Encounter: Payer: Self-pay | Admitting: Internal Medicine

## 2015-12-30 ENCOUNTER — Ambulatory Visit (INDEPENDENT_AMBULATORY_CARE_PROVIDER_SITE_OTHER): Payer: BLUE CROSS/BLUE SHIELD | Admitting: Internal Medicine

## 2015-12-30 VITALS — BP 128/68 | HR 79 | Ht 71.0 in | Wt 293.0 lb

## 2015-12-30 DIAGNOSIS — R0602 Shortness of breath: Secondary | ICD-10-CM | POA: Diagnosis not present

## 2015-12-30 DIAGNOSIS — I441 Atrioventricular block, second degree: Secondary | ICD-10-CM

## 2015-12-30 DIAGNOSIS — I440 Atrioventricular block, first degree: Secondary | ICD-10-CM

## 2015-12-30 NOTE — Progress Notes (Signed)
Patient Care Team: [name], MD as PCP - General (Family Medicine) [name], MD (Specialist) Hollice Espy, MD as Consulting Physician (Urology) Tanda Rockers, MD as Consulting Physician (Pulmonary Disease) Dionisio David, MD as Consulting Physician (Cardiology)   HPI  Nicholas [name]. is a 70 y.o. male Seen in follow-up for second-degree heart block initially detected a couple years ago.  We saw him again 11/17 because of a two-month history of shortness of breath. He described these episodes as quite abrupt and unassociated with palpitations or chest pain.  They typically last the order of fractions of minutes ie  20-30 seconds or so   Chronic peripheral edema. He has no nocturnal dyspnea or orthopnea. He does have significant sleep disordered breathing.  Echocardiogram 10/15 demonstrated mild LV dysfunction at 50%.; Original echo 3/15 demonstrated RV dysfunction which recovered  DATE TEST    10/15    eccho   EF 50 %   11/17/     echo   EF 60 %    Nl RV function   11/17 Myioview EF 46 ([facility])    A Holter monitor was done which we reviewed today. It demonstrates atrial fibrillation with rates in the 30s and a concerning degree of regularity concerning for complete heart block although QRS morphologies are not changed. It also demonstrated ongoing episodes of second-degree type I AV block with heart rates that run in the mid 30s.  His also had a history of pulmonary embolism with repeat venous Dopplers 1/16 demonstrated small clots. He has been managed with chronic apixaban  . He saw Dr. Clois Comber yesterday. A chest x-ray was undertaken which demonstrated no significant abnormalities apart from low volumes.    Records and Results Reviewed Pulmonary notes TSH was normal d-dimer was normal obtained just last week Past Medical History:  Diagnosis Date  . Anterior urethral stricture   . Anxiety   . Arthritis    a. knees, hips, hands;  b. 11/2013 s/p L TKA @  [place].  . Bulging lumbar disc   . BXO (balanitis xerotica obliterans)   . Depression   . First degree AV block   . Gross hematuria   . History of blood clots   . Hyperlipemia   . Hypertension    borderline  . Mobitz type I Wenckebach atrioventricular block   . Phimosis   . Pulmonary embolism ([place])   . Shortness of breath dyspnea    ON EXERTION    Past Surgical History:  Procedure Laterality Date  . BUNIONECTOMY Bilateral 01/06/2015   Procedure: BUNIONECTOMY;  Surgeon: [name], MD;  Location: [facility] ORS;  Service: Orthopedics;  Laterality: Bilateral;  . CARDIAC CATHETERIZATION  ~ 2005   "once"  . CATARACT EXTRACTION W/ INTRAOCULAR LENS  IMPLANT, BILATERAL Bilateral ~ 2010  . COLONOSCOPY WITH PROPOFOL N/A 12/07/2015   Procedure: COLONOSCOPY WITH PROPOFOL;  Surgeon: Lollie Sails, MD;  Location: [facility] ENDOSCOPY;  Service: Endoscopy;  Laterality: N/A;  . ESOPHAGOGASTRODUODENOSCOPY (EGD) WITH PROPOFOL N/A 12/07/2015   Procedure: ESOPHAGOGASTRODUODENOSCOPY (EGD) WITH PROPOFOL;  Surgeon: Lollie Sails, MD;  Location: [facility] ENDOSCOPY;  Service: Endoscopy;  Laterality: N/A;  . EYE SURGERY    . HAMMER TOE SURGERY Bilateral 01/06/2015   Procedure: HAMMER TOE CORRECTION;  Surgeon: [name], MD;  Location: [facility] ORS;  Service: Orthopedics;  Laterality: Bilateral;  . JOINT REPLACEMENT Bilateral    hips and knees  . KNEE ARTHROSCOPY Right 2013  . KNEE  CARTILAGE SURGERY Left 1965   "football injury"  . Left Total Knee Arthroplasty     a. 11/2013 [facility].  Marland Kitchen PILONIDAL CYST EXCISION  1970's  . TOTAL HIP ARTHROPLASTY Right 2004  . TOTAL HIP ARTHROPLASTY Left 2006    Current Outpatient Prescriptions  Medication Sig Dispense Refill  . apixaban (ELIQUIS) 5 MG TABS tablet Take 1 tablet (5 mg total) by mouth 2 (two) times daily. 180 tablet 4  . CRANBERRY CONCENTRATE PO Take 1 tablet by mouth daily.     . Cyanocobalamin (B-12) 1000 MCG CAPS Take 1 tablet by mouth daily.     . ferrous  sulfate 325 (65 FE) MG tablet Take 325 mg by mouth daily with breakfast.    . Fish Oil-Cholecalciferol (FISH OIL + D3 PO) Take 1,200 mg by mouth daily. AM    . furosemide (LASIX) 20 MG tablet Take one tablet (20 mg) daily on Tuesday/ Thursday/ Saturday/ sunday 30 tablet 2  . gabapentin (NEURONTIN) 400 MG capsule Take 1 capsule (400 mg total) by mouth 3 (three) times daily. 270 capsule 4  . HYDROcodone-acetaminophen (NORCO/VICODIN) 5-325 MG tablet Take 1 tablet by mouth 2 (two) times daily as needed. 60 tablet 0  . hyoscyamine (LEVSIN, ANASPAZ) 0.125 MG tablet Take 0.125 mg by mouth every 6 (six) hours as needed.    Marland Kitchen LORazepam (ATIVAN) 1 MG tablet TAKE 1 TABLET BY MOUTH AT BEDTIME 30 tablet 0  . Multiple Vitamin (MULTIVITAMIN) tablet Take 2 tablets by mouth daily. GUMMIES    . oxymetazoline (AFRIN) 0.05 % nasal spray Place 2 sprays into both nostrils at bedtime as needed for congestion.    . pantoprazole (PROTONIX) 40 MG tablet Take 40 mg by mouth daily.    . QUEtiapine Fumarate (SEROQUEL XR) 150 MG 24 hr tablet Take 1 tablet (150 mg total) by mouth at bedtime. 90 tablet 4  . simvastatin (ZOCOR) 20 MG tablet Take 1 tablet (20 mg total) by mouth daily. 90 tablet 4  . solifenacin (VESICARE) 10 MG tablet Take 1 tablet (10 mg total) by mouth daily. 90 tablet 4  . venlafaxine XR (EFFEXOR XR) 75 MG 24 hr capsule Take 1 capsule (75 mg total) by mouth daily with breakfast. 3 a day 270 capsule 4  . vitamin C (ASCORBIC ACID) 250 MG tablet Take 250 mg by mouth daily.     No current facility-administered medications for this visit.     No Known Allergies    Review of Systems negative except from HPI and PMH  Physical Exam BP 128/68 (BP Location: Left Arm, Patient Position: Sitting, Cuff Size: Large)   Pulse 79   Ht 5\' 11"  (1.803 m)   Wt 293 lb (132.9 kg)   BMI 40.87 kg/m  Well developed and Morbidly obese  in no acute distress HENT normal E scleral and icterus clear Neck  Supple JV8-10carotids brisk and full Clear to ausculation Regular rate and rhythm, no murmurs gallops or rub Soft with active bowel sounds No clubbing cyanosis Trace Edema Alert and oriented, grossly normal motor and sensory function Skin Warm and Dry   sinus rhythm at 67 Intervals 34/10/40 with blocked PACs  Holter was personally reviewed as described above  Assessment and  Plan  Episodic dyspnea  Obesity  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 His monitor raised the possibility that he is dyspneic episodes could be related to  either atrial fibrillation going slow, heart block. We will look into using a activated loop recorder either a trigger recorder or ZIO patch  We spent more than 50% of our >25 min visit in face to face counseling regarding the above     Current medicines are reviewed at length with the patient today .  The patient does not  have concerns regarding medicines.

## 2015-12-30 NOTE — Patient Instructions (Signed)
Medication Instructions: - Your physician recommends that you continue on your current medications as directed. Please refer to the Current Medication list given to you today.  Labwork: - none ordered  Procedures/Testing: - Dr. Caryl Comes needs to speak with our Zio patch monitor rep and then we will back in touch with you  Follow-Up: - pending  Any Additional Special Instructions Will Be Listed Below (If Applicable).     If you need a refill on your cardiac medications before your next appointment, please call your pharmacy.

## 2016-01-04 ENCOUNTER — Telehealth: Payer: Self-pay | Admitting: Family Medicine

## 2016-01-04 DIAGNOSIS — R1310 Dysphagia, unspecified: Secondary | ICD-10-CM | POA: Diagnosis not present

## 2016-01-04 DIAGNOSIS — K219 Gastro-esophageal reflux disease without esophagitis: Secondary | ICD-10-CM | POA: Diagnosis not present

## 2016-01-04 NOTE — Telephone Encounter (Signed)
Patient wanted to make sure we have on his records for his prescriptions, they need to be sent in as a 3 month supply not a monthly supply..  Thank you  Nicholas [name]

## 2016-01-04 NOTE — Telephone Encounter (Signed)
Patient needs to contact his pharmacy and notify them when he needs a refill to tell them he needs a 90 day supply. They will fax it to [place].   We can't edit his prescriptions unless they need to be filled and we don't have a way of remembering.

## 2016-01-05 NOTE — Telephone Encounter (Signed)
Patient notified

## 2016-01-13 ENCOUNTER — Telehealth: Payer: Self-pay | Admitting: Internal Medicine

## 2016-01-13 DIAGNOSIS — I440 Atrioventricular block, first degree: Secondary | ICD-10-CM

## 2016-01-13 NOTE — Telephone Encounter (Signed)
I called and spoke with the patient. He is aware that Dr. Caryl Comes spoke with the ZIO rep and there is a patient activator function on the device. Dr. Caryl Comes would like [place] to go ahead and set him up for this.  I advised him I will send a message to the schedulers in [place] to arrange a time for him to come in for this.  He is agreeable.

## 2016-01-13 NOTE — Telephone Encounter (Signed)
Dec 7 OV notes, Dr. Caryl Comes states he will s/w zio patch rep and f/u w/pt. Pt calls today asking for an update. Forward to Alvis Lemmings, RN

## 2016-01-13 NOTE — Telephone Encounter (Signed)
Pt wife is calling to see the status of pt monitor that dr Caryl Comes ordered. Please call and advise

## 2016-01-14 ENCOUNTER — Telehealth: Payer: Self-pay

## 2016-01-14 NOTE — Telephone Encounter (Signed)
-----   Message from Emily Filbert, RN sent at 01/13/2016  4:45 PM EST ----- Please call the patient to arrange for a ZIO patch to be placed.

## 2016-01-14 NOTE — Telephone Encounter (Signed)
L MOM TO CALL AND SCHEDULE ZIO MONITOR PLACEMENT

## 2016-01-20 ENCOUNTER — Ambulatory Visit: Payer: BLUE CROSS/BLUE SHIELD | Attending: Anesthesiology | Admitting: Anesthesiology

## 2016-01-20 ENCOUNTER — Encounter: Payer: Self-pay | Admitting: Anesthesiology

## 2016-01-20 VITALS — BP 142/90 | HR 83 | Temp 98.1°F | Resp 16 | Ht 71.0 in | Wt 296.0 lb

## 2016-01-20 DIAGNOSIS — Z86718 Personal history of other venous thrombosis and embolism: Secondary | ICD-10-CM | POA: Insufficient documentation

## 2016-01-20 DIAGNOSIS — R0609 Other forms of dyspnea: Secondary | ICD-10-CM | POA: Diagnosis not present

## 2016-01-20 DIAGNOSIS — I441 Atrioventricular block, second degree: Secondary | ICD-10-CM | POA: Insufficient documentation

## 2016-01-20 DIAGNOSIS — M160 Bilateral primary osteoarthritis of hip: Secondary | ICD-10-CM | POA: Diagnosis not present

## 2016-01-20 DIAGNOSIS — F329 Major depressive disorder, single episode, unspecified: Secondary | ICD-10-CM | POA: Diagnosis not present

## 2016-01-20 DIAGNOSIS — Z79899 Other long term (current) drug therapy: Secondary | ICD-10-CM | POA: Diagnosis not present

## 2016-01-20 DIAGNOSIS — M5136 Other intervertebral disc degeneration, lumbar region: Secondary | ICD-10-CM | POA: Insufficient documentation

## 2016-01-20 DIAGNOSIS — E785 Hyperlipidemia, unspecified: Secondary | ICD-10-CM | POA: Diagnosis not present

## 2016-01-20 DIAGNOSIS — M4687 Other specified inflammatory spondylopathies, lumbosacral region: Secondary | ICD-10-CM | POA: Insufficient documentation

## 2016-01-20 DIAGNOSIS — I10 Essential (primary) hypertension: Secondary | ICD-10-CM | POA: Insufficient documentation

## 2016-01-20 DIAGNOSIS — M545 Low back pain: Secondary | ICD-10-CM | POA: Diagnosis present

## 2016-01-20 DIAGNOSIS — R0602 Shortness of breath: Secondary | ICD-10-CM | POA: Insufficient documentation

## 2016-01-20 DIAGNOSIS — Z86711 Personal history of pulmonary embolism: Secondary | ICD-10-CM | POA: Diagnosis not present

## 2016-01-20 DIAGNOSIS — M51369 Other intervertebral disc degeneration, lumbar region without mention of lumbar back pain or lower extremity pain: Secondary | ICD-10-CM

## 2016-01-20 DIAGNOSIS — M4697 Unspecified inflammatory spondylopathy, lumbosacral region: Secondary | ICD-10-CM | POA: Diagnosis not present

## 2016-01-20 DIAGNOSIS — M170 Bilateral primary osteoarthritis of knee: Secondary | ICD-10-CM

## 2016-01-20 DIAGNOSIS — M47817 Spondylosis without myelopathy or radiculopathy, lumbosacral region: Secondary | ICD-10-CM

## 2016-01-20 DIAGNOSIS — F419 Anxiety disorder, unspecified: Secondary | ICD-10-CM | POA: Diagnosis not present

## 2016-01-20 MED ORDER — HYDROCODONE-ACETAMINOPHEN 5-325 MG PO TABS: 1.0000 | ORAL_TABLET | Freq: Two times a day (BID) | ORAL | 0 refills | Status: DC | PRN

## 2016-01-20 NOTE — Progress Notes (Signed)
Subjective:  Patient ID: Nicholas [name]., male    DOB: 08/24/1945  Age: 70 y.o. MRN: [id number]  CC: Back Pain (lower)  Procedure none Service Provided on Last Visit: Med Refill HPI Nicholas [name]. presents for  Reevaluation.He was last seen in early November. He has been unable to maintain his daily activity secondary to the cold weather. This is caused some worsening of his low back pain and posterior bilateral leg pain. He's taking his hydrocodone twice a day and this continues to be effective for him however the pain has been worse. No bowel or bladder function is noted and the quality characteristic addition we should've his pain has been stable.  We've reviewed the practitioner database information and it is appropriate.   Historically, He is a pleasant 70 year old white male with a long-standing history of diffuse pain with severe degenerative arthritis and low back pain. He is referred by Dr. Golden Pop for assistance with his medication management. His pain complaints revolve around chronic low back pain and knee pain and hip pain and pain and shoulder pain. Had multiple surgeries and continues to have diffuse body pain that is currently managed with Vicodin 5 mg twice a day. This regimen seems to help keep his pain under good control and he states that nothing else has helped. The pain has gotten worse over the years with a maximum VAS of 10 at best and 8 does not appear to be influenced by time of day. Aggravating factors include bending kneeling lifting sitting standing squatting and alleviating factors include stretching cold application and medication management. He has associated spasms pain that wakes him up at night fatigue and dizziness and the pain description includes a quality of aching and increasing duration pain. He's had multiple studies to evaluate the nature of his pain and its found to be mainly consistent with osteoarthritis and degenerative joint  disease.   History Nicholas [name] has a past medical history of Anterior urethral stricture; Anxiety; Arthritis; Bulging lumbar disc; BXO (balanitis xerotica obliterans); Depression; First degree AV block; Gross hematuria; History of blood clots; Hyperlipemia; Hypertension; Mobitz type I Wenckebach atrioventricular block; Phimosis; Pulmonary embolism ([place]); and Shortness of breath dyspnea.   He has a past surgical history that includes Total hip arthroplasty (Right, 2004); Total hip arthroplasty (Left, 2006); Knee cartilage surgery (Left, 1965); Knee arthroscopy (Right, 2013); Cataract extraction w/ intraocular lens  implant, bilateral (Bilateral, ~ 2010); Pilonidal cyst excision (1970's); Cardiac catheterization (~ 2005); Left Total Knee Arthroplasty; Hammer toe surgery (Bilateral, 01/06/2015); Bunionectomy (Bilateral, 01/06/2015); Eye surgery; Joint replacement (Bilateral); Esophagogastroduodenoscopy (egd) with propofol (N/A, 12/07/2015); and Colonoscopy with propofol (N/A, 12/07/2015).   His family history includes Alcoholism in his mother; Cancer in his mother and sister; Diabetes in his father; Hypertension in his father; Prostate cancer in his paternal grandfather; Stroke in his father.He reports that he has never smoked. He has never used smokeless tobacco. He reports that he drinks alcohol. He reports that he does not use drugs.  No results found for this or any previous visit.  ToxAssure Select 13  Date Value Ref Range Status  06/30/2015 FINAL  Final    Comment:    ==================================================================== TOXASSURE SELECT 13 (MW) ==================================================================== Test                             Result       Flag       Units Drug Present and  Declared for Prescription Verification   Lorazepam                      418          EXPECTED   ng/mg creat    Source of lorazepam is a scheduled prescription medication.   Hydrocodone                     35           EXPECTED   ng/mg creat   Norhydrocodone                 114          EXPECTED   ng/mg creat    Sources of hydrocodone include scheduled prescription    medications. Norhydrocodone is an expected metabolite of    hydrocodone. ==================================================================== Test                      Result    Flag   Units      Ref Range   Creatinine              200              mg/dL      >=20 ==================================================================== Declared Medications:  The flagging and interpretation on this report are based on the  following declared medications.  Unexpected results may arise from  inaccuracies in the declared medications.  **Note: The testing scope of this panel includes these medications:  Hydrocodone (Hydrocodone-Acetaminophen)  Lorazepam  **Note: The testing scope of this panel does not include following  reported medications:  Acetaminophen (Hydrocodone-Acetaminophen)  Apixaban  Cyanocobalamin  Gabapentin  Iron  Multivitamin  Omega-3 Fatty Acids (Fish Oil)  Oxymetazoline  Quetiapine  Simvastatin  Solifenacin  Venlafaxine  Vitamin C ==================================================================== For clinical consultation, please call [phone]. ====================================================================       ---------------------------------------------------------------------------------------------------------------------- Past Medical History:  Diagnosis Date  . Anterior urethral stricture   . Anxiety   . Arthritis    a. knees, hips, hands;  b. 11/2013 s/p L TKA @ [place].  . Bulging lumbar disc   . BXO (balanitis xerotica obliterans)   . Depression   . First degree AV block   . Gross hematuria   . History of blood clots   . Hyperlipemia   . Hypertension    borderline  . Mobitz type I Wenckebach atrioventricular block   . Phimosis   . Pulmonary embolism ([place])   .  Shortness of breath dyspnea    ON EXERTION    Past Surgical History:  Procedure Laterality Date  . BUNIONECTOMY Bilateral 01/06/2015   Procedure: BUNIONECTOMY;  Surgeon: [name], MD;  Location: [facility] ORS;  Service: Orthopedics;  Laterality: Bilateral;  . CARDIAC CATHETERIZATION  ~ 2005   "once"  . CATARACT EXTRACTION W/ INTRAOCULAR LENS  IMPLANT, BILATERAL Bilateral ~ 2010  . COLONOSCOPY WITH PROPOFOL N/A 12/07/2015   Procedure: COLONOSCOPY WITH PROPOFOL;  Surgeon: Lollie Sails, MD;  Location: [facility] ENDOSCOPY;  Service: Endoscopy;  Laterality: N/A;  . ESOPHAGOGASTRODUODENOSCOPY (EGD) WITH PROPOFOL N/A 12/07/2015   Procedure: ESOPHAGOGASTRODUODENOSCOPY (EGD) WITH PROPOFOL;  Surgeon: Lollie Sails, MD;  Location: [facility] ENDOSCOPY;  Service: Endoscopy;  Laterality: N/A;  . EYE SURGERY    . HAMMER TOE SURGERY Bilateral 01/06/2015   Procedure: HAMMER TOE CORRECTION;  Surgeon: [name], MD;  Location: [facility] ORS;  Service: Orthopedics;  Laterality: Bilateral;  .  JOINT REPLACEMENT Bilateral    hips and knees  . KNEE ARTHROSCOPY Right 2013  . KNEE CARTILAGE SURGERY Left 1965   "football injury"  . Left Total Knee Arthroplasty     a. 11/2013 [facility].  Marland Kitchen PILONIDAL CYST EXCISION  1970's  . TOTAL HIP ARTHROPLASTY Right 2004  . TOTAL HIP ARTHROPLASTY Left 2006    Family History  Problem Relation Age of Onset  . Stroke Father     deceased  . Diabetes Father   . Hypertension Father   . Alcoholism Mother     died in her 53's.  . Cancer Mother   . Cancer Sister   . Prostate cancer Paternal Grandfather     Social History  Substance Use Topics  . Smoking status: Never Smoker  . Smokeless tobacco: Never Used  . Alcohol use 0.0 oz/week     Comment: 2 DRINKS/MONTH    ---------------------------------------------------------------------------------------------------------------------- Social History   Social History  . Marital status: Married    Spouse name: N/A  . Number  of children: N/A  . Years of education: N/A   Social History Main Topics  . Smoking status: Never Smoker  . Smokeless tobacco: Never Used  . Alcohol use 0.0 oz/week     Comment: 2 DRINKS/MONTH  . Drug use: No  . Sexual activity: Not Currently   Other Topics Concern  . None   Social History Narrative   Lives in [place] with wife.  Does not routinely exercise.  Activity severely limited by bilateral knee pain.      ----------------------------------------------------------------------------------------------------------------------  ROS Review of Systems  No changes are mentioned  GI: No constipation Objective:  BP (!) 142/90   Pulse 83   Temp 98.1 F (36.7 C) (Oral)   Resp 16   Ht 5\' 11"  (1.803 m)   Wt 296 lb (134.3 kg)   SpO2 96%   BMI 41.28 kg/m   Physical Exam  No change on physical exam is noted. Heart is regular rate and rhythm Lungs are clear to also dictation Strength in the lower extremities is without changeAnd his exam appears stable  Previous MRI has revealed diffuse facet and degenerative disc disease throughout the lumbar spine.     Assessment & Plan:   Nicholas [name] was seen today for back pain.  Diagnoses and all orders for this visit:  DDD (degenerative disc disease), lumbar -     ToxASSURE Select 13 (MW), Urine  Facet arthritis of lumbosacral region ([place])  Primary osteoarthritis of both knees  Other orders -     Discontinue: HYDROcodone-acetaminophen (NORCO/VICODIN) 5-325 MG tablet; Take 1 tablet by mouth 2 (two) times daily as needed. -     HYDROcodone-acetaminophen (NORCO/VICODIN) 5-325 MG tablet; Take 1 tablet by mouth 2 (two) times daily as needed.     ----------------------------------------------------------------------------------------------------------------------  Problem List Items Addressed This Visit    None    Visit Diagnoses    DDD (degenerative disc disease), lumbar    -  Primary   Relevant Medications    HYDROcodone-acetaminophen (NORCO/VICODIN) 5-325 MG tablet   Other Relevant Orders   ToxASSURE Select 13 (MW), Urine   Facet arthritis of lumbosacral region [facility])       Relevant Medications   HYDROcodone-acetaminophen (NORCO/VICODIN) 5-325 MG tablet   Primary osteoarthritis of both knees          ----------------------------------------------------------------------------------------------------------------------  1. DDD (degenerative disc disease), lumbar Still refusing to consider epidural steroid administration..Once again we have talked about opportunities with injection therapy to help  with his low back pain and leg pain. -2. Facet arthritis of lumbosacral region.. I have encouraged him to continue with back stretching strengthening exercises and core strengthening. We will refill his medications today with return to clinic in 2 months. 3. Primary osteoarthritis of both knees 4. Osteoarthritis of right hip, unspecified osteoarthritis type  -5. Primary osteoarthritis of left hip 6. Shortness of breath and dyspnea on exertion undercurrent follow-up with his primary care physicians  -  ----------------------------------------------------------------------------------------------------------------------  I am having Mr. Mcguinness maintain his multivitamin, Fish Oil-Cholecalciferol (FISH OIL + D3 PO), oxymetazoline, B-12, ferrous sulfate, vitamin C, apixaban, gabapentin, QUEtiapine Fumarate, simvastatin, solifenacin, venlafaxine XR, CRANBERRY CONCENTRATE PO, pantoprazole, hyoscyamine, furosemide, LORazepam, amoxicillin, oxymetazoline, pantoprazole, and HYDROcodone-acetaminophen.   Meds ordered this encounter  Medications  . amoxicillin (AMOXIL) 500 MG capsule    Sig: TAKE 4 CAPSULES BY MOUTH 1 HOUR PRIOR TO PROCEDURE    Refill:  0  . oxymetazoline (AFRIN) 0.05 % nasal spray    Sig: by Nasal route.  . pantoprazole (PROTONIX) 40 MG tablet    Sig: Take by mouth.  . DISCONTD:  HYDROcodone-acetaminophen (NORCO/VICODIN) 5-325 MG tablet    Sig: Take 1 tablet by mouth 2 (two) times daily as needed.    Dispense:  60 tablet    Refill:  0    Do not fill until [id number]  . HYDROcodone-acetaminophen (NORCO/VICODIN) 5-325 MG tablet    Sig: Take 1 tablet by mouth 2 (two) times daily as needed.    Dispense:  60 tablet    Refill:  0    Do not fill until [id number]       Follow-up: Return in about 2 months (around 03/22/2016) for evaluation, med refill.    [name], MD  This dictation was performed utilizing Dragon voice recognition software.  Please excuse any unintentional or mistaken typographical errors as a result of its unedited utilization.

## 2016-01-20 NOTE — Progress Notes (Signed)
Safety precautions to be maintained throughout the outpatient stay will include: orient to surroundings, keep bed in low position, maintain call bell within reach at all times, provide assistance with transfer out of bed and ambulation.  

## 2016-01-20 NOTE — Patient Instructions (Signed)

## 2016-01-21 ENCOUNTER — Ambulatory Visit (INDEPENDENT_AMBULATORY_CARE_PROVIDER_SITE_OTHER): Payer: BLUE CROSS/BLUE SHIELD

## 2016-01-21 DIAGNOSIS — I440 Atrioventricular block, first degree: Secondary | ICD-10-CM

## 2016-01-24 ENCOUNTER — Other Ambulatory Visit: Payer: Self-pay | Admitting: Family Medicine

## 2016-01-25 NOTE — Telephone Encounter (Signed)
Prescription phoned into pharmacy.

## 2016-01-25 NOTE — Telephone Encounter (Signed)
OK to call in

## 2016-01-25 NOTE — Telephone Encounter (Signed)
Last OV: 11/16/15 Next OV: 05/16/16

## 2016-01-27 LAB — TOXASSURE SELECT 13 (MW), URINE

## 2016-02-08 ENCOUNTER — Other Ambulatory Visit: Payer: Self-pay | Admitting: Family Medicine

## 2016-02-08 DIAGNOSIS — I440 Atrioventricular block, first degree: Secondary | ICD-10-CM | POA: Diagnosis not present

## 2016-02-08 MED ORDER — LORAZEPAM 1 MG PO TABS: 1.0000 mg | ORAL_TABLET | Freq: Every day | ORAL | 0 refills | Status: DC

## 2016-02-08 NOTE — Telephone Encounter (Signed)
Patient came by the office needing Dr Jeananne Rama to send in a prescription for the patients Lorazepam 1mg  .  He needs a 90 day supply in order for his insurance to pay.  Per patient this has been requested.  Thank You  Santiago Glad  [facility]   Lorazepam 1mg  90 day supply

## 2016-02-08 NOTE — Telephone Encounter (Signed)
Last seen 11/16/15 Next OV: 05/16/16   Pt states he needs a 90 day supply for insurance to cover.

## 2016-02-09 ENCOUNTER — Telehealth: Payer: Self-pay | Admitting: Nurse Practitioner

## 2016-02-09 NOTE — Telephone Encounter (Signed)
Received phone call from "Raquel Sarna" with monitoring company regarding Nicholas [name] - DOB 11/18/1945.  He has worn a monitor (the patch) from 12/29 to 1/10 - this was sent back and was uploaded.   She was calling to report 14 episodes of CHB - lasting total of 3 minutes and 23 seconds, 1 run of VT 12 beats with max rate of 143 and average rate of 123 as well as 35 % burden of AF.  I have called Dr. Caryl Comes and passed on this information to him regarding this patient and he will take care of.   Burtis Junes, RN, [place] [address] [place], [place]  [address] [phone]

## 2016-02-11 ENCOUNTER — Telehealth: Payer: Self-pay | Admitting: Internal Medicine

## 2016-02-11 NOTE — Telephone Encounter (Signed)
Nicholas Sprang, MD  Jeannette How; Blain Pais; Emily Filbert, RN        S I told Pt to come in Tues 1/23 at 845 and we woiuld pencil him in for pacemaker on Wed 1/24  Thanks      Will review Eliquis with Dr. Caryl Comes- patient with history of a PE/

## 2016-02-11 NOTE — Telephone Encounter (Signed)
Pt states he is having a pacer implanted on 1/24, and asks if he needs to stop Eliquis. Please call and advise.

## 2016-02-15 ENCOUNTER — Encounter: Payer: Self-pay | Admitting: Internal Medicine

## 2016-02-15 ENCOUNTER — Ambulatory Visit (INDEPENDENT_AMBULATORY_CARE_PROVIDER_SITE_OTHER): Payer: BLUE CROSS/BLUE SHIELD | Admitting: Internal Medicine

## 2016-02-15 ENCOUNTER — Encounter: Payer: Self-pay | Admitting: *Deleted

## 2016-02-15 VITALS — BP 120/70 | HR 55 | Ht 71.0 in | Wt 292.5 lb

## 2016-02-15 DIAGNOSIS — I441 Atrioventricular block, second degree: Secondary | ICD-10-CM | POA: Diagnosis not present

## 2016-02-15 DIAGNOSIS — Z01812 Encounter for preprocedural laboratory examination: Secondary | ICD-10-CM

## 2016-02-15 DIAGNOSIS — I442 Atrioventricular block, complete: Secondary | ICD-10-CM

## 2016-02-15 DIAGNOSIS — I440 Atrioventricular block, first degree: Secondary | ICD-10-CM | POA: Diagnosis not present

## 2016-02-15 NOTE — Progress Notes (Signed)
Patient Care Team: [name], MD as PCP - General (Family Medicine) [name], MD (Specialist) Hollice Espy, MD as Consulting Physician (Urology) Tanda Rockers, MD as Consulting Physician (Pulmonary Disease) Dionisio David, MD as Consulting Physician (Cardiology)   HPI  Nicholas [name]. is a 71 y.o. male Seen in follow-up for an abnormal event recorder undertaken because of episodes of dizziness and shortness of breath. It demonstrated second-degree heart block  initially detected a couple years ago but >>>35% atrial fib/Flutter with RVR 2AVB 1 3 AVB albeit with baseline QRS    Chronic peripheral edema. He has no nocturnal dyspnea or orthopnea. He does have significant sleep disordered breathing.    DATE TEST    10/15    echo   EF 50 %   Mild RV dysfunction   11/17/     echo   EF 60 %    Nl RV function   11/17 Myoview EF 5 [facility])      His also had a history of pulmonary embolism with repeat venous Dopplers 1/16 demonstrated small clots. He has been managed with chronic apixaban  . H     Records and Results Reviewed Pulmonary notes TSH was normal d-dimer was normal obtained   Past Medical History:  Diagnosis Date  . Anterior urethral stricture   . Anxiety   . Arthritis    a. knees, hips, hands;  b. 11/2013 s/p L TKA @ [place].  . Bulging lumbar disc   . BXO (balanitis xerotica obliterans)   . Depression   . First degree AV block   . Gross hematuria   . History of blood clots   . Hyperlipemia   . Hypertension    borderline  . Mobitz type I Wenckebach atrioventricular block   . Phimosis   . Pulmonary embolism ([place])   . Shortness of breath dyspnea    ON EXERTION    Past Surgical History:  Procedure Laterality Date  . BUNIONECTOMY Bilateral 01/06/2015   Procedure: BUNIONECTOMY;  Surgeon: [name], MD;  Location: [facility] ORS;  Service: Orthopedics;  Laterality: Bilateral;  . CARDIAC CATHETERIZATION  ~ 2005   "once"  . CATARACT EXTRACTION  W/ INTRAOCULAR LENS  IMPLANT, BILATERAL Bilateral ~ 2010  . COLONOSCOPY WITH PROPOFOL N/A 12/07/2015   Procedure: COLONOSCOPY WITH PROPOFOL;  Surgeon: Lollie Sails, MD;  Location: [facility] ENDOSCOPY;  Service: Endoscopy;  Laterality: N/A;  . ESOPHAGOGASTRODUODENOSCOPY (EGD) WITH PROPOFOL N/A 12/07/2015   Procedure: ESOPHAGOGASTRODUODENOSCOPY (EGD) WITH PROPOFOL;  Surgeon: Lollie Sails, MD;  Location: [facility] ENDOSCOPY;  Service: Endoscopy;  Laterality: N/A;  . EYE SURGERY    . HAMMER TOE SURGERY Bilateral 01/06/2015   Procedure: HAMMER TOE CORRECTION;  Surgeon: [name], MD;  Location: [facility] ORS;  Service: Orthopedics;  Laterality: Bilateral;  . JOINT REPLACEMENT Bilateral    hips and knees  . KNEE ARTHROSCOPY Right 2013  . KNEE CARTILAGE SURGERY Left 1965   "football injury"  . Left Total Knee Arthroplasty     a. 11/2013 [facility].  Marland Kitchen PILONIDAL CYST EXCISION  1970's  . TOTAL HIP ARTHROPLASTY Right 2004  . TOTAL HIP ARTHROPLASTY Left 2006    Current Outpatient Prescriptions  Medication Sig Dispense Refill  . acetaminophen (TYLENOL) 500 MG tablet Take 2,000 mg by mouth daily as needed for moderate pain or headache.    Marland Kitchen apixaban (ELIQUIS) 5 MG TABS tablet Take 1 tablet (5 mg total) by mouth 2 (two) times daily. [place]  tablet 4  . Ascorbic Acid (VITAMIN C) 1000 MG tablet Take 1,000 mg by mouth daily.    Marland Kitchen CRANBERRY CONCENTRATE PO Take 1 tablet by mouth daily.     . Cyanocobalamin (B-12) 1000 MCG CAPS Take 1,000 mcg by mouth daily.     . ferrous sulfate 325 (65 FE) MG tablet Take 325 mg by mouth daily with breakfast.    . furosemide (LASIX) 20 MG tablet Take one tablet (20 mg) daily on Tuesday/ Thursday/ Saturday/ sunday 30 tablet 2  . gabapentin (NEURONTIN) 400 MG capsule Take 1 capsule (400 mg total) by mouth 3 (three) times daily. 270 capsule 4  . HYDROcodone-acetaminophen (NORCO/VICODIN) 5-325 MG tablet Take 1 tablet by mouth 2 (two) times daily as needed. (Patient taking differently:  Take 1 tablet by mouth 2 (two) times daily. ) 60 tablet 0  . hyoscyamine (LEVSIN, ANASPAZ) 0.125 MG tablet Take 0.125 mg by mouth every 6 (six) hours as needed for bladder spasms or cramping.     Marland Kitchen LORazepam (ATIVAN) 1 MG tablet Take 1 tablet (1 mg total) by mouth at bedtime. 90 tablet 0  . Multiple Vitamin (MULTIVITAMIN) tablet Take 2 tablets by mouth daily. GUMMIES    . Omega-3 Fatty Acids (FISH OIL) 1200 MG CAPS Take 1,200 mg by mouth daily.    Marland Kitchen oxymetazoline (AFRIN) 0.05 % nasal spray Place 2 sprays into both nostrils at bedtime as needed for congestion.    . pantoprazole (PROTONIX) 40 MG tablet Take 40 mg by mouth daily.    . QUEtiapine Fumarate (SEROQUEL XR) 150 MG 24 hr tablet Take 1 tablet (150 mg total) by mouth at bedtime. 90 tablet 4  . simvastatin (ZOCOR) 20 MG tablet Take 1 tablet (20 mg total) by mouth daily. 90 tablet 4  . solifenacin (VESICARE) 10 MG tablet Take 1 tablet (10 mg total) by mouth daily. 90 tablet 4  . venlafaxine XR (EFFEXOR XR) 75 MG 24 hr capsule Take 1 capsule (75 mg total) by mouth daily with breakfast. 3 a day (Patient taking differently: Take 225 mg by mouth daily. ) 270 capsule 4   No current facility-administered medications for this visit.     No Known Allergies    Review of Systems negative except from HPI and PMH  Physical Exam BP 120/70 (BP Location: Left Arm, Patient Position: Sitting, Cuff Size: Normal)   Pulse (!) 55   Ht 5\' 11"  (1.803 m)   Wt 292 lb 8 oz (132.7 kg)   BMI 40.80 kg/m  Well developed and Morbidly obese  in no acute distress HENT normal E scleral and icterus clear Neck Supple JVp flat carotids brisk and full Clear to ausculation Regular rate and rhythm, no murmurs gallops or rub Soft with active bowel sounds No clubbing cyanosis Trace Edema Alert and oriented, grossly normal motor and sensory function Skin Warm and Dry   sinus rhythm at 67 Intervals 2 AVB 1  Junctional escape  Holter was personally reviewed as  described above  Assessment and  Plan  Episodic dyspnea  Obesity  Dizziness  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 Given his symptoms of dizziness and episodic dyspnea with heart rates during the daytime and to the 30s and episode markers associated with bradycardia as well as atrial fibrillation it seemed reasonable to pace. His atrial fibrillation rates are  sometimes very slow as are his sinus rates, and so once we have addressed the issue of rate we will then try to sort out whether the rhythm is contributing to his symptoms.  The benefits and risks were reviewed including but not limited to death,  perforation, infection, lead dislodgement and device malfunction.  The patient understands agrees and is willing to proceed.  We will hold apixoban for two doses prior to implant      We spent more than 50% of our >25 min visit in face to face counseling regarding the above     Current medicines are reviewed at length with the patient today .  The patient does not  have concerns regarding medicines.

## 2016-02-15 NOTE — Telephone Encounter (Signed)
Patient seen in clinic today- PPM scheduled for 02/16/16. He will hold eliquis tonight and in the AM.  Instructions given to the patient at his office visit today.

## 2016-02-15 NOTE — Patient Instructions (Addendum)
Medication Instructions: - Your physician recommends that you continue on your current medications as directed. Please refer to the Current Medication list given to you today.  Labwork: - Your physician recommends that you have lab work today: BMP/CBC  Procedures/Testing: - Your physician has recommended that you have a pacemaker inserted. A pacemaker is a small device that is placed under the skin of your chest or abdomen to help control abnormal heart rhythms. This device uses electrical pulses to prompt the heart to beat at a normal rate. Pacemakers are used to treat heart rhythms that are too slow. Wire (leads) are attached to the pacemaker that goes into the chambers of you heart. This is done in the hospital and usually requires and overnight stay. Please see the instruction sheet given to you today for more information.  Follow-Up: - Your physician recommends that you schedule a follow-up appointment in: about 14 days (from 02/16/16) for a wound check with the [facility].    Any Additional Special Instructions Will Be Listed Below (If Applicable).     If you need a refill on your cardiac medications before your next appointment, please call your pharmacy.

## 2016-02-16 ENCOUNTER — Ambulatory Visit (HOSPITAL_COMMUNITY)
Admission: RE | Admit: 2016-02-16 | Discharge: 2016-02-17 | Disposition: A | Discharge status: Home / Self Care | Payer: BLUE CROSS/BLUE SHIELD | Source: Ambulatory Visit | Attending: Internal Medicine | Admitting: Internal Medicine

## 2016-02-16 ENCOUNTER — Encounter (HOSPITAL_COMMUNITY)
Admission: RE | Disposition: A | Discharge status: Home / Self Care | Payer: Self-pay | Source: Ambulatory Visit | Attending: Internal Medicine

## 2016-02-16 DIAGNOSIS — I82509 Chronic embolism and thrombosis of unspecified deep veins of unspecified lower extremity: Secondary | ICD-10-CM | POA: Diagnosis not present

## 2016-02-16 DIAGNOSIS — Z79899 Other long term (current) drug therapy: Secondary | ICD-10-CM | POA: Diagnosis not present

## 2016-02-16 DIAGNOSIS — I2782 Chronic pulmonary embolism: Secondary | ICD-10-CM | POA: Insufficient documentation

## 2016-02-16 DIAGNOSIS — Z6841 Body Mass Index (BMI) 40.0 and over, adult: Secondary | ICD-10-CM | POA: Diagnosis not present

## 2016-02-16 DIAGNOSIS — Z96643 Presence of artificial hip joint, bilateral: Secondary | ICD-10-CM | POA: Diagnosis not present

## 2016-02-16 DIAGNOSIS — Z7901 Long term (current) use of anticoagulants: Secondary | ICD-10-CM | POA: Diagnosis not present

## 2016-02-16 DIAGNOSIS — E669 Obesity, unspecified: Secondary | ICD-10-CM | POA: Diagnosis not present

## 2016-02-16 DIAGNOSIS — E785 Hyperlipidemia, unspecified: Secondary | ICD-10-CM | POA: Insufficient documentation

## 2016-02-16 DIAGNOSIS — I442 Atrioventricular block, complete: Secondary | ICD-10-CM

## 2016-02-16 DIAGNOSIS — I10 Essential (primary) hypertension: Secondary | ICD-10-CM | POA: Diagnosis not present

## 2016-02-16 DIAGNOSIS — Z959 Presence of cardiac and vascular implant and graft, unspecified: Secondary | ICD-10-CM

## 2016-02-16 HISTORY — PX: EP IMPLANTABLE DEVICE: SHX172B

## 2016-02-16 LAB — BASIC METABOLIC PANEL
BUN / CREAT RATIO: 13 (ref 10–24)
BUN: 14 mg/dL (ref 8–27)
CO2: 19 mmol/L (ref 18–29)
CREATININE: 1.05 mg/dL (ref 0.76–1.27)
Calcium: 9.4 mg/dL (ref 8.6–10.2)
Chloride: 104 mmol/L (ref 96–106)
GFR, EST AFRICAN AMERICAN: 83 mL/min/{1.73_m2} (ref >59)
GFR, EST NON AFRICAN AMERICAN: 72 mL/min/{1.73_m2} (ref >59)
Glucose: 145 mg/dL — ABNORMAL HIGH (ref 65–99)
Potassium: 4.3 mmol/L (ref 3.5–5.2)
SODIUM: 139 mmol/L (ref 134–144)

## 2016-02-16 LAB — CBC WITH DIFFERENTIAL/PLATELET
BASOS ABS: 0 10*3/uL (ref 0.0–0.2)
Basos: 1 %
EOS (ABSOLUTE): 0.2 10*3/uL (ref 0.0–0.4)
EOS: 4 %
HEMATOCRIT: 46.6 % (ref 37.5–51.0)
HEMOGLOBIN: 15.8 g/dL (ref 13.0–17.7)
Immature Grans (Abs): 0 10*3/uL (ref 0.0–0.1)
Immature Granulocytes: 1 %
LYMPHS ABS: 0.9 10*3/uL (ref 0.7–3.1)
Lymphs: 15 %
MCH: 31.4 pg (ref 26.6–33.0)
MCHC: 33.9 g/dL (ref 31.5–35.7)
MCV: 93 fL (ref 79–97)
MONOCYTES: 8 %
Monocytes Absolute: 0.4 10*3/uL (ref 0.1–0.9)
NEUTROS ABS: 4.3 10*3/uL (ref 1.4–7.0)
Neutrophils: 71 %
Platelets: 149 10*3/uL — ABNORMAL LOW (ref 150–379)
RBC: 5.03 x10E6/uL (ref 4.14–5.80)
RDW: 13.7 % (ref 12.3–15.4)
WBC: 5.8 10*3/uL (ref 3.4–10.8)

## 2016-02-16 LAB — SURGICAL PCR SCREEN
MRSA, PCR: NEGATIVE
STAPHYLOCOCCUS AUREUS: NEGATIVE

## 2016-02-16 SURGERY — PACEMAKER IMPLANT
Anesthesia: LOCAL

## 2016-02-16 MED ORDER — LIDOCAINE HCL (PF) 1 % IJ SOLN
INTRAMUSCULAR | Status: AC
  Filled 2016-02-16: qty 60

## 2016-02-16 MED ORDER — FENTANYL CITRATE (PF) 100 MCG/2ML IJ SOLN
INTRAMUSCULAR | Status: DC | PRN
  Administered 2016-02-16 (×3): 25 ug via INTRAVENOUS

## 2016-02-16 MED ORDER — MUPIROCIN 2 % EX OINT
TOPICAL_OINTMENT | Freq: Once | CUTANEOUS | Status: AC
  Administered 2016-02-16: 1 via NASAL

## 2016-02-16 MED ORDER — GABAPENTIN 400 MG PO CAPS
400.0000 mg | ORAL_CAPSULE | Freq: Three times a day (TID) | ORAL | Status: DC
  Administered 2016-02-16: 400 mg via ORAL
  Filled 2016-02-16: qty 1

## 2016-02-16 MED ORDER — FUROSEMIDE 20 MG PO TABS: 20.0000 mg | ORAL_TABLET | ORAL | Status: DC

## 2016-02-16 MED ORDER — LORAZEPAM 1 MG PO TABS
1.0000 mg | ORAL_TABLET | Freq: Every day | ORAL | Status: DC
  Administered 2016-02-16: 1 mg via ORAL
  Filled 2016-02-16: qty 1

## 2016-02-16 MED ORDER — ACETAMINOPHEN 325 MG PO TABS
325.0000 mg | ORAL_TABLET | ORAL | Status: DC | PRN
  Administered 2016-02-17 (×2): 650 mg via ORAL
  Filled 2016-02-16 (×2): qty 2

## 2016-02-16 MED ORDER — LIDOCAINE HCL (PF) 1 % IJ SOLN
INTRAMUSCULAR | Status: DC | PRN
  Administered 2016-02-16: 55 mL

## 2016-02-16 MED ORDER — SODIUM CHLORIDE 0.9 % IR SOLN
Status: AC
  Filled 2016-02-16: qty 2

## 2016-02-16 MED ORDER — MIDAZOLAM HCL 5 MG/5ML IJ SOLN
INTRAMUSCULAR | Status: AC
  Filled 2016-02-16: qty 5

## 2016-02-16 MED ORDER — SODIUM CHLORIDE 0.9 % IR SOLN
80.0000 mg | Status: AC
  Administered 2016-02-16: 80 mg

## 2016-02-16 MED ORDER — VITAMIN C 500 MG PO TABS
1000.0000 mg | ORAL_TABLET | Freq: Every day | ORAL | Status: DC
Start: 2016-02-17 — End: 2016-02-17

## 2016-02-16 MED ORDER — ADULT MULTIVITAMIN W/MINERALS CH
1.0000 | ORAL_TABLET | Freq: Every day | ORAL | Status: DC
  Administered 2016-02-16: 1 via ORAL
  Filled 2016-02-16 (×2): qty 1

## 2016-02-16 MED ORDER — SIMVASTATIN 20 MG PO TABS: 20.0000 mg | ORAL_TABLET | Freq: Every day | ORAL | Status: DC

## 2016-02-16 MED ORDER — OXYMETAZOLINE HCL 0.05 % NA SOLN
2.0000 | Freq: Every evening | NASAL | Status: DC | PRN
  Filled 2016-02-16: qty 15

## 2016-02-16 MED ORDER — HYOSCYAMINE SULFATE 0.125 MG PO TABS
0.1250 mg | ORAL_TABLET | Freq: Four times a day (QID) | ORAL | Status: DC | PRN
  Filled 2016-02-16: qty 1

## 2016-02-16 MED ORDER — SODIUM CHLORIDE 0.9 % IV SOLN
INTRAVENOUS | Status: DC
  Administered 2016-02-16: 14:00:00 via INTRAVENOUS

## 2016-02-16 MED ORDER — VITAMIN B-12 1000 MCG PO TABS: 1000.0000 ug | ORAL_TABLET | Freq: Every day | ORAL | Status: DC

## 2016-02-16 MED ORDER — APIXABAN 5 MG PO TABS
5.0000 mg | ORAL_TABLET | Freq: Two times a day (BID) | ORAL | Status: DC
  Administered 2016-02-16: 5 mg via ORAL
  Filled 2016-02-16: qty 1

## 2016-02-16 MED ORDER — FENTANYL CITRATE (PF) 100 MCG/2ML IJ SOLN
INTRAMUSCULAR | Status: AC
  Filled 2016-02-16: qty 2

## 2016-02-16 MED ORDER — MUPIROCIN 2 % EX OINT
TOPICAL_OINTMENT | CUTANEOUS | Status: AC
  Filled 2016-02-16: qty 22

## 2016-02-16 MED ORDER — PANTOPRAZOLE SODIUM 40 MG PO TBEC: 40.0000 mg | DELAYED_RELEASE_TABLET | Freq: Every day | ORAL | Status: DC

## 2016-02-16 MED ORDER — HEPARIN (PORCINE) IN NACL 2-0.9 UNIT/ML-% IJ SOLN
INTRAMUSCULAR | Status: DC | PRN
  Administered 2016-02-16: 500 mL

## 2016-02-16 MED ORDER — HYDROCODONE-ACETAMINOPHEN 5-325 MG PO TABS
1.0000 | ORAL_TABLET | Freq: Two times a day (BID) | ORAL | Status: DC | PRN
  Administered 2016-02-16: 1 via ORAL
  Filled 2016-02-16: qty 1

## 2016-02-16 MED ORDER — DEXTROSE 5 % IV SOLN
3.0000 g | INTRAVENOUS | Status: AC
  Administered 2016-02-16: 3 g via INTRAVENOUS
  Filled 2016-02-16 (×2): qty 3000

## 2016-02-16 MED ORDER — CHLORHEXIDINE GLUCONATE 4 % EX LIQD
60.0000 mL | Freq: Once | CUTANEOUS | Status: DC
  Filled 2016-02-16: qty 60

## 2016-02-16 MED ORDER — VENLAFAXINE HCL ER 75 MG PO CP24
225.0000 mg | ORAL_CAPSULE | Freq: Every day | ORAL | Status: DC
  Filled 2016-02-16: qty 1

## 2016-02-16 MED ORDER — FERROUS SULFATE 325 (65 FE) MG PO TABS
325.0000 mg | ORAL_TABLET | Freq: Every day | ORAL | Status: DC
  Administered 2016-02-17: 325 mg via ORAL
  Filled 2016-02-16: qty 1

## 2016-02-16 MED ORDER — MIDAZOLAM HCL 5 MG/5ML IJ SOLN
INTRAMUSCULAR | Status: DC | PRN
  Administered 2016-02-16: 1 mg via INTRAVENOUS
  Administered 2016-02-16: 2 mg via INTRAVENOUS
  Administered 2016-02-16: 1 mg via INTRAVENOUS

## 2016-02-16 MED ORDER — CEFAZOLIN IN D5W 1 GM/50ML IV SOLN
1.0000 g | Freq: Four times a day (QID) | INTRAVENOUS | Status: AC
  Administered 2016-02-16 – 2016-02-17 (×3): 1 g via INTRAVENOUS
  Filled 2016-02-16 (×3): qty 50

## 2016-02-16 MED ORDER — ONDANSETRON HCL 4 MG/2ML IJ SOLN: 4.0000 mg | Freq: Four times a day (QID) | INTRAMUSCULAR | Status: DC | PRN

## 2016-02-16 MED ORDER — QUETIAPINE FUMARATE ER 50 MG PO TB24
150.0000 mg | ORAL_TABLET | Freq: Every day | ORAL | Status: DC
  Administered 2016-02-16: 150 mg via ORAL
  Filled 2016-02-16: qty 3

## 2016-02-16 SURGICAL SUPPLY — 12 items
CABLE SURGICAL S-101-97-12 (CABLE) ×2 IMPLANT
CATH RIGHTSITE C315HIS02 (CATHETERS) ×1 IMPLANT
HEMOSTAT SURGICEL 2X4 FIBR (HEMOSTASIS) ×1 IMPLANT
LEAD CAPSURE NOVUS 5076-52CM (Lead) ×1 IMPLANT
LEAD SELECT SECURE 3830 383069 (Lead) IMPLANT
PAD DEFIB LIFELINK (PAD) ×1 IMPLANT
PPM ADVISA MRI DR A2DR01 (Pacemaker) ×1 IMPLANT
SELECT SECURE 3830 383069 (Lead) ×2 IMPLANT
SHEATH CLASSIC 7F (SHEATH) ×2 IMPLANT
SLITTER 6232ADJ (MISCELLANEOUS) ×1 IMPLANT
TRAY PACEMAKER INSERTION (PACKS) ×1 IMPLANT
WIRE HI TORQ VERSACORE-J 145CM (WIRE) ×1 IMPLANT

## 2016-02-16 NOTE — Discharge Instructions (Signed)
° ° °  Supplemental Discharge Instructions for  Pacemaker/Defibrillator Patients  Activity No heavy lifting or vigorous activity with your left/right arm for 6 to 8 weeks.  Do not raise your left/right arm above your head for one week.  Gradually raise your affected arm as drawn below.             02/20/16                     02/21/16                    02/22/16                  02/23/16 __  NO DRIVING for 1 week    ; you may begin driving on  [id number]   .  WOUND CARE - Keep the wound area clean and dry.  Do not get this area wet, no showers for 24 hours; you may shower on 02/17/16 evening  . - The tape/steri-strips on your wound will fall off; do not pull them off.  No bandage is needed on the site.  DO  NOT apply any creams, oils, or ointments to the wound area. - If you notice any drainage or discharge from the wound, any swelling or bruising at the site, or you develop a fever > 101? F after you are discharged home, call the office at once.  Special Instructions - You are still able to use cellular telephones; use the ear opposite the side where you have your pacemaker/defibrillator.  Avoid carrying your cellular phone near your device. - When traveling through airports, show security personnel your identification card to avoid being screened in the metal detectors.  Ask the security personnel to use the hand wand. - Avoid arc welding equipment, MRI testing (magnetic resonance imaging), TENS units (transcutaneous nerve stimulators).  Call the office for questions about other devices. - Avoid electrical appliances that are in poor condition or are not properly grounded. - Microwave ovens are safe to be near or to operate.  Additional information for defibrillator patients should your device go off: - If your device goes off ONCE and you feel fine afterward, notify the device clinic nurses. - If your device goes off ONCE and you do not feel well afterward, call 911. - If your device goes off  TWICE, call 911. - If your device goes off THREE times in one day, call 911.  DO NOT DRIVE YOURSELF OR A FAMILY MEMBER WITH A DEFIBRILLATOR TO THE HOSPITAL--CALL 911.

## 2016-02-17 ENCOUNTER — Ambulatory Visit (HOSPITAL_COMMUNITY): Payer: BLUE CROSS/BLUE SHIELD

## 2016-02-17 ENCOUNTER — Encounter (HOSPITAL_COMMUNITY): Payer: Self-pay | Admitting: Internal Medicine

## 2016-02-17 DIAGNOSIS — E785 Hyperlipidemia, unspecified: Secondary | ICD-10-CM | POA: Diagnosis not present

## 2016-02-17 DIAGNOSIS — I82509 Chronic embolism and thrombosis of unspecified deep veins of unspecified lower extremity: Secondary | ICD-10-CM | POA: Diagnosis not present

## 2016-02-17 DIAGNOSIS — J449 Chronic obstructive pulmonary disease, unspecified: Secondary | ICD-10-CM | POA: Diagnosis not present

## 2016-02-17 DIAGNOSIS — I442 Atrioventricular block, complete: Secondary | ICD-10-CM | POA: Diagnosis not present

## 2016-02-17 DIAGNOSIS — Z79899 Other long term (current) drug therapy: Secondary | ICD-10-CM | POA: Diagnosis not present

## 2016-02-17 DIAGNOSIS — Z6841 Body Mass Index (BMI) 40.0 and over, adult: Secondary | ICD-10-CM | POA: Diagnosis not present

## 2016-02-17 DIAGNOSIS — Z7901 Long term (current) use of anticoagulants: Secondary | ICD-10-CM | POA: Diagnosis not present

## 2016-02-17 DIAGNOSIS — I2782 Chronic pulmonary embolism: Secondary | ICD-10-CM | POA: Diagnosis not present

## 2016-02-17 DIAGNOSIS — Z96643 Presence of artificial hip joint, bilateral: Secondary | ICD-10-CM | POA: Diagnosis not present

## 2016-02-17 DIAGNOSIS — I10 Essential (primary) hypertension: Secondary | ICD-10-CM | POA: Diagnosis not present

## 2016-02-17 DIAGNOSIS — E669 Obesity, unspecified: Secondary | ICD-10-CM | POA: Diagnosis not present

## 2016-02-17 NOTE — Interval H&P Note (Signed)
History and Physical Interval Note:  02/17/2016 9:19 AM  Nicholas [name]  has presented today for surgery, with the diagnosis of complete heart block  The various methods of treatment have been discussed with the patient and family. After consideration of risks, benefits and other options for treatment, the patient has consented to  Procedure(s): Pacemaker Implant (N/A) as a surgical intervention .  The patient's history has been reviewed, patient examined, no change in status, stable for surgery.  I have reviewed the patient's chart and labs.  Questions were answered to the patient's satisfaction.    No interval changes  Virl Axe

## 2016-02-17 NOTE — Progress Notes (Signed)
Discharge instructions & education given to both the patient his wife & other family. Everyone was educated on meds, follow-up appointments, diet, physical restrictions, driving restrictions, & lifestyle changes as it relates to his pace maker. All questions were answered & all belongings were given to pt. Pt's IV & telemetry were removed. CCMD was notified of pt being discharged. Hoover Brunette, RN

## 2016-02-17 NOTE — H&P (View-Only) (Signed)
Patient Care Team: [name], MD as PCP - General (Family Medicine) [name], MD (Specialist) Hollice Espy, MD as Consulting Physician (Urology) Tanda Rockers, MD as Consulting Physician (Pulmonary Disease) Dionisio David, MD as Consulting Physician (Cardiology)   HPI  Nicholas [name]. is a 71 y.o. male Seen in follow-up for an abnormal event recorder undertaken because of episodes of dizziness and shortness of breath. It demonstrated second-degree heart block  initially detected a couple years ago but >>>35% atrial fib/Flutter with RVR 2AVB 1 3 AVB albeit with baseline QRS    Chronic peripheral edema. He has no nocturnal dyspnea or orthopnea. He does have significant sleep disordered breathing.    DATE TEST    10/15    echo   EF 50 %   Mild RV dysfunction   11/17/     echo   EF 60 %    Nl RV function   11/17 Myoview EF 31 [facility])      His also had a history of pulmonary embolism with repeat venous Dopplers 1/16 demonstrated small clots. He has been managed with chronic apixaban  . H     Records and Results Reviewed Pulmonary notes TSH was normal d-dimer was normal obtained   Past Medical History:  Diagnosis Date  . Anterior urethral stricture   . Anxiety   . Arthritis    a. knees, hips, hands;  b. 11/2013 s/p L TKA @ [place].  . Bulging lumbar disc   . BXO (balanitis xerotica obliterans)   . Depression   . First degree AV block   . Gross hematuria   . History of blood clots   . Hyperlipemia   . Hypertension    borderline  . Mobitz type I Wenckebach atrioventricular block   . Phimosis   . Pulmonary embolism ([place])   . Shortness of breath dyspnea    ON EXERTION    Past Surgical History:  Procedure Laterality Date  . BUNIONECTOMY Bilateral 01/06/2015   Procedure: BUNIONECTOMY;  Surgeon: [name], MD;  Location: [facility] ORS;  Service: Orthopedics;  Laterality: Bilateral;  . CARDIAC CATHETERIZATION  ~ 2005   "once"  . CATARACT EXTRACTION  W/ INTRAOCULAR LENS  IMPLANT, BILATERAL Bilateral ~ 2010  . COLONOSCOPY WITH PROPOFOL N/A 12/07/2015   Procedure: COLONOSCOPY WITH PROPOFOL;  Surgeon: Lollie Sails, MD;  Location: [facility] ENDOSCOPY;  Service: Endoscopy;  Laterality: N/A;  . ESOPHAGOGASTRODUODENOSCOPY (EGD) WITH PROPOFOL N/A 12/07/2015   Procedure: ESOPHAGOGASTRODUODENOSCOPY (EGD) WITH PROPOFOL;  Surgeon: Lollie Sails, MD;  Location: [facility] ENDOSCOPY;  Service: Endoscopy;  Laterality: N/A;  . EYE SURGERY    . HAMMER TOE SURGERY Bilateral 01/06/2015   Procedure: HAMMER TOE CORRECTION;  Surgeon: [name], MD;  Location: [facility] ORS;  Service: Orthopedics;  Laterality: Bilateral;  . JOINT REPLACEMENT Bilateral    hips and knees  . KNEE ARTHROSCOPY Right 2013  . KNEE CARTILAGE SURGERY Left 1965   "football injury"  . Left Total Knee Arthroplasty     a. 11/2013 [facility].  Marland Kitchen PILONIDAL CYST EXCISION  1970's  . TOTAL HIP ARTHROPLASTY Right 2004  . TOTAL HIP ARTHROPLASTY Left 2006    Current Outpatient Prescriptions  Medication Sig Dispense Refill  . acetaminophen (TYLENOL) 500 MG tablet Take 2,000 mg by mouth daily as needed for moderate pain or headache.    Marland Kitchen apixaban (ELIQUIS) 5 MG TABS tablet Take 1 tablet (5 mg total) by mouth 2 (two) times daily. [place]  tablet 4  . Ascorbic Acid (VITAMIN C) 1000 MG tablet Take 1,000 mg by mouth daily.    Marland Kitchen CRANBERRY CONCENTRATE PO Take 1 tablet by mouth daily.     . Cyanocobalamin (B-12) 1000 MCG CAPS Take 1,000 mcg by mouth daily.     . ferrous sulfate 325 (65 FE) MG tablet Take 325 mg by mouth daily with breakfast.    . furosemide (LASIX) 20 MG tablet Take one tablet (20 mg) daily on Tuesday/ Thursday/ Saturday/ sunday 30 tablet 2  . gabapentin (NEURONTIN) 400 MG capsule Take 1 capsule (400 mg total) by mouth 3 (three) times daily. 270 capsule 4  . HYDROcodone-acetaminophen (NORCO/VICODIN) 5-325 MG tablet Take 1 tablet by mouth 2 (two) times daily as needed. (Patient taking differently:  Take 1 tablet by mouth 2 (two) times daily. ) 60 tablet 0  . hyoscyamine (LEVSIN, ANASPAZ) 0.125 MG tablet Take 0.125 mg by mouth every 6 (six) hours as needed for bladder spasms or cramping.     Marland Kitchen LORazepam (ATIVAN) 1 MG tablet Take 1 tablet (1 mg total) by mouth at bedtime. 90 tablet 0  . Multiple Vitamin (MULTIVITAMIN) tablet Take 2 tablets by mouth daily. GUMMIES    . Omega-3 Fatty Acids (FISH OIL) 1200 MG CAPS Take 1,200 mg by mouth daily.    Marland Kitchen oxymetazoline (AFRIN) 0.05 % nasal spray Place 2 sprays into both nostrils at bedtime as needed for congestion.    . pantoprazole (PROTONIX) 40 MG tablet Take 40 mg by mouth daily.    . QUEtiapine Fumarate (SEROQUEL XR) 150 MG 24 hr tablet Take 1 tablet (150 mg total) by mouth at bedtime. 90 tablet 4  . simvastatin (ZOCOR) 20 MG tablet Take 1 tablet (20 mg total) by mouth daily. 90 tablet 4  . solifenacin (VESICARE) 10 MG tablet Take 1 tablet (10 mg total) by mouth daily. 90 tablet 4  . venlafaxine XR (EFFEXOR XR) 75 MG 24 hr capsule Take 1 capsule (75 mg total) by mouth daily with breakfast. 3 a day (Patient taking differently: Take 225 mg by mouth daily. ) 270 capsule 4   No current facility-administered medications for this visit.     No Known Allergies    Review of Systems negative except from HPI and PMH  Physical Exam BP 120/70 (BP Location: Left Arm, Patient Position: Sitting, Cuff Size: Normal)   Pulse (!) 55   Ht 5\' 11"  (1.803 m)   Wt 292 lb 8 oz (132.7 kg)   BMI 40.80 kg/m  Well developed and Morbidly obese  in no acute distress HENT normal E scleral and icterus clear Neck Supple JVp flat carotids brisk and full Clear to ausculation Regular rate and rhythm, no murmurs gallops or rub Soft with active bowel sounds No clubbing cyanosis Trace Edema Alert and oriented, grossly normal motor and sensory function Skin Warm and Dry   sinus rhythm at 67 Intervals 2 AVB 1  Junctional escape  Holter was personally reviewed as  described above  Assessment and  Plan  Episodic dyspnea  Obesity  Dizziness  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 Given his symptoms of dizziness and episodic dyspnea with heart rates during the daytime and to the 30s and episode markers associated with bradycardia as well as atrial fibrillation it seemed reasonable to pace. His atrial fibrillation rates are  sometimes very slow as are his sinus rates, and so once we have addressed the issue of rate we will then try to sort out whether the rhythm is contributing to his symptoms.  The benefits and risks were reviewed including but not limited to death,  perforation, infection, lead dislodgement and device malfunction.  The patient understands agrees and is willing to proceed.  We will hold apixoban for two doses prior to implant      We spent more than 50% of our >25 min visit in face to face counseling regarding the above     Current medicines are reviewed at length with the patient today .  The patient does not  have concerns regarding medicines.

## 2016-02-17 NOTE — Progress Notes (Signed)
Pt as well as family informed & educated on the pt being in a video surveillance room. Both pt & family stated it was okay for pt to be remain in the room. Hoover Brunette, RN

## 2016-02-17 NOTE — Discharge Summary (Signed)
ELECTROPHYSIOLOGY PROCEDURE DISCHARGE SUMMARY    Patient ID: Nicholas [name],  MRN: [id number], DOB/AGE: Jun 02, 1945 71 y.o.  Admit date: 02/16/2016 Discharge date: 02/17/2016  Primary Care Physician:Mark Jeananne Rama, MD  Primary Cardiologist: Dr. Humphrey Rolls Electrophysiologist: Dr. Caryl Comes  Primary Discharge Diagnosis:  1. Symptomatic bradycardia, high degree AVblock     status post pacemaker implantation this admission  Secondary Discharge Diagnosis:  1. PAFib     CHA2DS2Vasc is 2     Hx of DVT/DVT     On Eliquis 2. Obesity 3. HTN  No Known Allergies   Procedures This Admission:  1.  Implantation of a MDT dual chamber (RA and HIS positions) PPM on 02/16/16 by Dr Caryl Comes.  The patient received a Medtronic MRI compatible pulse generator serial number [id number],MRI compatible right atrial lead serial number [id number], Medtronic MRI compatible [id number] active fixation lead serial number [id number] (HIS) . There were no immediate post procedure complications. 2.  CXR on 02/17/16 demonstrated no pneumothorax status post device implantation.   Brief HPI: Nicholas [name] is a 71 y.o. male was referred to electrophysiology in the outpatient setting for consideration of PPM implantation.  Past medical history includes PAFib, DVT/PE, obesity, HTN.  The patient has had symptomatic bradycardia without reversible causes identified.  Risks, benefits, and alternatives to PPM implantation were reviewed with the patient who wished to proceed.   Hospital Course:  The patient was admitted and underwent implantation of a PPM with details as outlined above.  He  was monitored on telemetry overnight which demonstrated SR, APacing, occ AV pacing.  Left chest was without hematoma or ecchymosis.  The device was interrogated and found to be functioning normally.  CXR was obtained and demonstrated no pneumothorax status post device implantation, patient denies any pulmonary symptoms, no cough, SOB, encouraged ambulation.   Wound care, arm mobility, and restrictions were reviewed with the patient.  The patient was examined by Dr. Caryl Comes and considered stable for discharge to home.    Physical Exam: Vitals:   02/16/16 2346 02/17/16 0146 02/17/16 0346 02/17/16 0443  BP: 128/71 132/64 134/71 134/71  Pulse: 67 (!) 59 65 62  Resp: 16 19 11 17   Temp: 97.9 F (36.6 C)   98.1 F (36.7 C)  TempSrc: Oral   Oral  SpO2: 92% 90% 95% 92%  Weight:    287 lb 1.6 oz (130.2 kg)  Height:        GEN- The patient is well appearing, alert and oriented x 3 today.   HEENT: normocephalic, atraumatic; sclera clear, conjunctiva pink; hearing intact; oropharynx clear; neck supple, no JVP Lungs- Clear to ausculation bilaterally, normal work of breathing.  No wheezes, rales, rhonchi Heart- Regular rate and rhythm, no murmurs, rubs or gallops, PMI not laterally displaced GI- soft, non-tender, non-distended Extremities- no clubbing, cyanosis, or edema MS- no significant deformity or atrophy Skin- warm and dry, no rash or lesion, left chest without hematoma/ecchymosis Psych- euthymic mood, full affect Neuro- no gross deficits   Labs:   Lab Results  Component Value Date   WBC 5.8 02/15/2016   HGB 15.2 04/30/2015   HCT 46.6 02/15/2016   MCV 93 02/15/2016   PLT 149 (L) 02/15/2016     Recent Labs Lab 02/15/16 0840  NA 139  K 4.3  CL 104  CO2 19  BUN 14  CREATININE 1.05  CALCIUM 9.4  GLUCOSE 145*    Discharge Medications:  Allergies as of 02/17/2016   No Known Allergies  Medication List    TAKE these medications   acetaminophen 500 MG tablet Commonly known as:  TYLENOL Take 2,000 mg by mouth daily as needed for moderate pain or headache.   apixaban 5 MG Tabs tablet Commonly known as:  ELIQUIS Take 1 tablet (5 mg total) by mouth 2 (two) times daily.   B-12 1000 MCG Caps Take 1,000 mcg by mouth daily.   CRANBERRY CONCENTRATE PO Take 1 tablet by mouth daily.   ferrous sulfate 325 (65 FE) MG  tablet Take 325 mg by mouth daily with breakfast.   Fish Oil 1200 MG Caps Take 1,200 mg by mouth daily.   furosemide 20 MG tablet Commonly known as:  LASIX Take one tablet (20 mg) daily on Tuesday/ Thursday/ Saturday/ sunday   gabapentin 400 MG capsule Commonly known as:  NEURONTIN Take 1 capsule (400 mg total) by mouth 3 (three) times daily.   HYDROcodone-acetaminophen 5-325 MG tablet Commonly known as:  NORCO/VICODIN Take 1 tablet by mouth 2 (two) times daily as needed. What changed:  when to take this   hyoscyamine 0.125 MG tablet Commonly known as:  LEVSIN, ANASPAZ Take 0.125 mg by mouth every 6 (six) hours as needed for bladder spasms or cramping.   LORazepam 1 MG tablet Commonly known as:  ATIVAN Take 1 tablet (1 mg total) by mouth at bedtime.   multivitamin tablet Take 2 tablets by mouth daily. GUMMIES   oxymetazoline 0.05 % nasal spray Commonly known as:  AFRIN Place 2 sprays into both nostrils at bedtime as needed for congestion.   pantoprazole 40 MG tablet Commonly known as:  PROTONIX Take 40 mg by mouth daily.   QUEtiapine Fumarate 150 MG 24 hr tablet Commonly known as:  SEROQUEL XR Take 1 tablet (150 mg total) by mouth at bedtime.   simvastatin 20 MG tablet Commonly known as:  ZOCOR Take 1 tablet (20 mg total) by mouth daily.   solifenacin 10 MG tablet Commonly known as:  VESICARE Take 1 tablet (10 mg total) by mouth daily.   venlafaxine XR 75 MG 24 hr capsule Commonly known as:  EFFEXOR XR Take 1 capsule (75 mg total) by mouth daily with breakfast. 3 a day What changed:  how much to take  when to take this  additional instructions   vitamin C 1000 MG tablet Take 1,000 mg by mouth daily.       Disposition:  Home  Follow-up Information    [employer] [employer] [facility] Follow up on 03/01/2016.   Specialty:  Cardiology Why:  9:30AM, wound check Contact information: [address] [place] [phone]          Duration of Discharge Encounter: Greater than 30 minutes including physician time.  Venetia Night, PA-C 02/17/2016 8:46 AM  Seen and examined Instructions given S/p His Bundle pacing for CHB Device interrogated

## 2016-03-01 ENCOUNTER — Ambulatory Visit (INDEPENDENT_AMBULATORY_CARE_PROVIDER_SITE_OTHER): Payer: BLUE CROSS/BLUE SHIELD | Admitting: *Deleted

## 2016-03-01 DIAGNOSIS — Z950 Presence of cardiac pacemaker: Secondary | ICD-10-CM

## 2016-03-01 DIAGNOSIS — I442 Atrioventricular block, complete: Secondary | ICD-10-CM | POA: Diagnosis not present

## 2016-03-01 LAB — CUP PACEART INCLINIC DEVICE CHECK
Battery Voltage: 3.05 V
Brady Statistic AS VS Percent: 0.01 %
Date Time Interrogation Session: 20180207104535
Implantable Lead Model: 5076
Implantable Pulse Generator Implant Date: 20180124
Lead Channel Impedance Value: 380 Ohm
Lead Channel Impedance Value: 513 Ohm
Lead Channel Pacing Threshold Amplitude: 1 V
Lead Channel Pacing Threshold Pulse Width: 1 ms
Lead Channel Sensing Intrinsic Amplitude: 1.75 mV
Lead Channel Sensing Intrinsic Amplitude: 7 mV
Lead Channel Setting Pacing Amplitude: 3.5 V
Lead Channel Setting Pacing Pulse Width: 1 ms
MDC IDC LEAD IMPLANT DT: 20180124
MDC IDC LEAD IMPLANT DT: 20180124
MDC IDC LEAD LOCATION: 753859
MDC IDC LEAD LOCATION: 753860
MDC IDC MSMT LEADCHNL RA PACING THRESHOLD AMPLITUDE: 0.5 V
MDC IDC MSMT LEADCHNL RA PACING THRESHOLD PULSEWIDTH: 0.4 ms
MDC IDC MSMT LEADCHNL RV IMPEDANCE VALUE: 342 Ohm
MDC IDC MSMT LEADCHNL RV IMPEDANCE VALUE: 437 Ohm
MDC IDC SET LEADCHNL RV PACING AMPLITUDE: 3.5 V
MDC IDC SET LEADCHNL RV SENSING SENSITIVITY: 0.9 mV
MDC IDC STAT BRADY AP VP PERCENT: 63.56 %
MDC IDC STAT BRADY AP VS PERCENT: 0.02 %
MDC IDC STAT BRADY AS VP PERCENT: 36.41 %
MDC IDC STAT BRADY RA PERCENT PACED: 62.87 %
MDC IDC STAT BRADY RV PERCENT PACED: 99.92 %

## 2016-03-01 NOTE — Progress Notes (Signed)
Wound check appointment. Steri-strips removed. Wound without redness or edema. Incision edges approximated, wound well healed. Normal device function. Thresholds, sensing, and impedances consistent with implant measurements. Device programmed at 3.5V with auto capture programmed on (RA only) for extra safety margin until 3 month visit. Rhythm strip obtained, reviewed with AS, appears intermittent non-selective His/septal capture above 4.5V @ 1.66ms, selective capture until LOC at 0.75V @ 1.59ms. Histogram distribution appropriate for patient and level of activity. No mode switches or high ventricular rates noted. Patient educated about wound care, arm mobility, lifting restrictions, and Carelink Smart monitor. ROV with AS on 03/30/16 and ROV with SK/B on 05/23/16.

## 2016-03-02 ENCOUNTER — Telehealth: Payer: Self-pay | Admitting: *Deleted

## 2016-03-02 NOTE — Telephone Encounter (Signed)
Spoke with patient to clarify recommendations for returning to golfing.  Advised that Dr. Olin Pia recommendations are typically that patients can putt two weeks after implant, chip 4 weeks after, and drive 6 weeks after.  Patient verbalizes understanding and appreciation.  He denies additional questions or concerns at this time.

## 2016-03-04 ENCOUNTER — Telehealth: Payer: Self-pay | Admitting: Student

## 2016-03-04 NOTE — Telephone Encounter (Signed)
  Patient called the answering service reporting dizziness when going from sitting to standing. No presyncope, syncope, falls, chest pain, palpitations, or dyspnea. Device just checked on 2/7 and functioning normally. He has not checked BP recently. I advised him to check BP (when sitting and standing). Has not been taking PO Lasix. Advised increasing fluid consumption.  Will notify the office and ask them to call him on Monday. If still having symptoms, would benefit from EP APP office visit.   Signed, Erma Heritage, PA-C 03/04/2016, 4:13 PM Pager: [phone]

## 2016-03-06 ENCOUNTER — Telehealth: Payer: Self-pay | Admitting: *Deleted

## 2016-03-06 NOTE — Telephone Encounter (Signed)
I placed call to pt to follow up on phone message over the weekend.  Left message to call office.

## 2016-03-06 NOTE — Telephone Encounter (Signed)
I spoke with pt who reports he is feeling fine now.  Felt good yesterday. Today he went to breakfast, to the store and to the gym without any problems.  He is aware of appt with Chanetta Marshall, NP on 03/30/16.

## 2016-03-16 ENCOUNTER — Telehealth: Payer: Self-pay | Admitting: Internal Medicine

## 2016-03-16 NOTE — Telephone Encounter (Signed)
Pt wife called, states pt had pacer implanted on 1/24. She states pt has had dizziness that has worsened over the past few weeks. Please call.

## 2016-03-21 ENCOUNTER — Ambulatory Visit: Payer: BLUE CROSS/BLUE SHIELD | Attending: Anesthesiology | Admitting: Anesthesiology

## 2016-03-21 VITALS — BP 126/81 | HR 95 | Temp 97.7°F | Resp 16 | Ht 70.0 in | Wt 278.0 lb

## 2016-03-21 DIAGNOSIS — M160 Bilateral primary osteoarthritis of hip: Secondary | ICD-10-CM | POA: Diagnosis not present

## 2016-03-21 DIAGNOSIS — M4697 Unspecified inflammatory spondylopathy, lumbosacral region: Secondary | ICD-10-CM

## 2016-03-21 DIAGNOSIS — M4687 Other specified inflammatory spondylopathies, lumbosacral region: Secondary | ICD-10-CM | POA: Diagnosis not present

## 2016-03-21 DIAGNOSIS — M545 Low back pain: Secondary | ICD-10-CM

## 2016-03-21 DIAGNOSIS — M1611 Unilateral primary osteoarthritis, right hip: Secondary | ICD-10-CM | POA: Diagnosis not present

## 2016-03-21 DIAGNOSIS — M1612 Unilateral primary osteoarthritis, left hip: Secondary | ICD-10-CM | POA: Diagnosis not present

## 2016-03-21 DIAGNOSIS — M5136 Other intervertebral disc degeneration, lumbar region: Secondary | ICD-10-CM | POA: Insufficient documentation

## 2016-03-21 DIAGNOSIS — Z79899 Other long term (current) drug therapy: Secondary | ICD-10-CM | POA: Insufficient documentation

## 2016-03-21 DIAGNOSIS — Z7901 Long term (current) use of anticoagulants: Secondary | ICD-10-CM | POA: Diagnosis not present

## 2016-03-21 DIAGNOSIS — G8929 Other chronic pain: Secondary | ICD-10-CM | POA: Diagnosis not present

## 2016-03-21 DIAGNOSIS — M47817 Spondylosis without myelopathy or radiculopathy, lumbosacral region: Secondary | ICD-10-CM

## 2016-03-21 MED ORDER — HYDROCODONE-ACETAMINOPHEN 5-325 MG PO TABS: 1.0000 | ORAL_TABLET | Freq: Two times a day (BID) | ORAL | 0 refills | Status: DC

## 2016-03-21 NOTE — Progress Notes (Signed)
Safety precautions to be maintained throughout the outpatient stay will include: orient to surroundings, keep bed in low position, maintain call bell within reach at all times, provide assistance with transfer out of bed and ambulation.  

## 2016-03-22 NOTE — Progress Notes (Signed)
Subjective:  Patient ID: Nicholas [name], male    DOB: 1945/04/24  Age: 71 y.o. MRN: [id number]  CC: Hip Pain (right) and Back Pain (lower)  Procedure none   HPI JERRIUS RICCIARDELLI was last seen at the end of December and continues to do well with his current medication regimen. The quality characteristic and distribution of the pain remains stable without change in lower extremity strength or function. His bowel bladder function has remained stable. Based on his narcotic assessment sheet he has been compliant with her regimen and continues to derive good lifestyle improvement with the medication management. Otherwise he is reporting that he is in his usual state of health today.   We've reviewed the practitioner database information and it is appropriate.   Historically, He is a pleasant 71 year old white male with a long-standing history of diffuse pain with severe degenerative arthritis and low back pain. He is referred by Dr. Golden Pop for assistance with his medication management. His pain complaints revolve around chronic low back pain and knee pain and hip pain and pain and shoulder pain. Had multiple surgeries and continues to have diffuse body pain that is currently managed with Vicodin 5 mg twice a day. This regimen seems to help keep his pain under good control and he states that nothing else has helped. The pain has gotten worse over the years with a maximum VAS of 10 at best and 8 does not appear to be influenced by time of day. Aggravating factors include bending kneeling lifting sitting standing squatting and alleviating factors include stretching cold application and medication management. He has associated spasms pain that wakes him up at night fatigue and dizziness and the pain description includes a quality of aching and increasing duration pain. He's had multiple studies to evaluate the nature of his pain and its found to be mainly consistent with osteoarthritis and degenerative joint  disease.   History Hardie has a past medical history of Anterior urethral stricture; Anxiety; Arthritis; Bulging lumbar disc; BXO (balanitis xerotica obliterans); Depression; First degree AV block; Gross hematuria; History of blood clots; Hyperlipemia; Hypertension; Mobitz type I Wenckebach atrioventricular block; Phimosis; Pulmonary embolism ([place]); and Shortness of breath dyspnea.   He has a past surgical history that includes Total hip arthroplasty (Right, 2004); Total hip arthroplasty (Left, 2006); Knee cartilage surgery (Left, 1965); Knee arthroscopy (Right, 2013); Cataract extraction w/ intraocular lens  implant, bilateral (Bilateral, ~ 2010); Pilonidal cyst excision (1970's); Cardiac catheterization (~ 2005); Left Total Knee Arthroplasty; Hammer toe surgery (Bilateral, 01/06/2015); Bunionectomy (Bilateral, 01/06/2015); Eye surgery; Joint replacement (Bilateral); Esophagogastroduodenoscopy (egd) with propofol (N/A, 12/07/2015); Colonoscopy with propofol (N/A, 12/07/2015); and Cardiac catheterization (N/A, 02/16/2016).   His family history includes Alcoholism in his mother; Cancer in his mother and sister; Diabetes in his father; Hypertension in his father; Prostate cancer in his paternal grandfather; Stroke in his father.He reports that he has never smoked. He has never used smokeless tobacco. He reports that he drinks alcohol. He reports that he does not use drugs.  No results found for this or any previous visit.  [facility] Select 13  Date Value Ref Range Status  01/20/2016 FINAL  Final    Comment:    ==================================================================== [facility] SELECT 13 (MW) ==================================================================== Test                             Result       Flag  Units Drug Present and Declared for Prescription Verification   Lorazepam                      490          EXPECTED   ng/mg creat    Source of lorazepam is a scheduled  prescription medication.   Hydrocodone                    1757         EXPECTED   ng/mg creat   Hydromorphone                  150          EXPECTED   ng/mg creat   Dihydrocodeine                 161          EXPECTED   ng/mg creat   Norhydrocodone                 1630         EXPECTED   ng/mg creat    Sources of hydrocodone include scheduled prescription    medications. Hydromorphone, dihydrocodeine and norhydrocodone are    expected metabolites of hydrocodone. Hydromorphone and    dihydrocodeine are also available as scheduled prescription    medications. ==================================================================== Test                      Result    Flag   Units      Ref Range   Creatinine              279              mg/dL      >=20 ==================================================================== Declared Medications:  The flagging and interpretation on this report are based on the  following declared medications.  Unexpected results may arise from  inaccuracies in the declared medications.  **Note: The testing scope of this panel includes these medications:  Hydrocodone (Norco)  Lorazepam (Ativan)  **Note: The testing scope of this panel does not include following  reported medications:  Acetaminophen (Norco)  Amoxicillin  Apixaban (Eliquis)  Cholecalciferol  Cyanocobalamin  Furosemide (Lasix)  Gabapentin (Neurontin)  Hyoscyamine  Iron (Ferrous Sulfate)  Multivitamin  Omega-3 Fatty Acids (Fish Oil)  Oxymetazoline (Afrin)  Pantoprazole (Protonix)  Quetiapine (Seroquel)  Simvastatin  Solifenacin (Vesicare)  Supplement  Venlafaxine (Effexor)  Vitamin C ==================================================================== For clinical consultation, please call [phone]. ====================================================================        ---------------------------------------------------------------------------------------------------------------------- Past Medical History:  Diagnosis Date  . Anterior urethral stricture   . Anxiety   . Arthritis    a. knees, hips, hands;  b. 11/2013 s/p L TKA @ [facility].  . Bulging lumbar disc   . BXO (balanitis xerotica obliterans)   . Depression   . First degree AV block   . Gross hematuria   . History of blood clots   . Hyperlipemia   . Hypertension    borderline  . Mobitz type I Wenckebach atrioventricular block   . Phimosis   . Pulmonary embolism ([place])   . Shortness of breath dyspnea    ON EXERTION    Past Surgical History:  Procedure Laterality Date  . BUNIONECTOMY Bilateral 01/06/2015   Procedure: BUNIONECTOMY;  Surgeon: [name], MD;  Location: [facility] ORS;  Service: Orthopedics;  Laterality: Bilateral;  . CARDIAC CATHETERIZATION  ~ 2005   "  once"  . CATARACT EXTRACTION W/ INTRAOCULAR LENS  IMPLANT, BILATERAL Bilateral ~ 2010  . COLONOSCOPY WITH PROPOFOL N/A 12/07/2015   Procedure: COLONOSCOPY WITH PROPOFOL;  Surgeon: Lollie Sails, MD;  Location: [facility] ENDOSCOPY;  Service: Endoscopy;  Laterality: N/A;  . EP IMPLANTABLE DEVICE N/A 02/16/2016   Procedure: Pacemaker Implant;  Surgeon: Deboraha Sprang, MD;  Location: [facility];  Service: Cardiovascular;  Laterality: N/A;  . ESOPHAGOGASTRODUODENOSCOPY (EGD) WITH PROPOFOL N/A 12/07/2015   Procedure: ESOPHAGOGASTRODUODENOSCOPY (EGD) WITH PROPOFOL;  Surgeon: Lollie Sails, MD;  Location: [facility] ENDOSCOPY;  Service: Endoscopy;  Laterality: N/A;  . EYE SURGERY    . HAMMER TOE SURGERY Bilateral 01/06/2015   Procedure: HAMMER TOE CORRECTION;  Surgeon: [name], MD;  Location: [facility] ORS;  Service: Orthopedics;  Laterality: Bilateral;  . JOINT REPLACEMENT Bilateral    hips and knees  . KNEE ARTHROSCOPY Right 2013  . KNEE CARTILAGE SURGERY Left 1965   "football injury"  . Left Total Knee Arthroplasty      a. 11/2013 [facility].  Marland Kitchen PILONIDAL CYST EXCISION  1970's  . TOTAL HIP ARTHROPLASTY Right 2004  . TOTAL HIP ARTHROPLASTY Left 2006    Family History  Problem Relation Age of Onset  . Stroke Father     deceased  . Diabetes Father   . Hypertension Father   . Alcoholism Mother     died in her 74's.  . Cancer Mother   . Cancer Sister   . Prostate cancer Paternal Grandfather     Social History  Substance Use Topics  . Smoking status: Never Smoker  . Smokeless tobacco: Never Used  . Alcohol use 0.0 oz/week     Comment: 2 DRINKS/MONTH    ---------------------------------------------------------------------------------------------------------------------- Social History   Social History  . Marital status: Married    Spouse name: N/A  . Number of children: N/A  . Years of education: N/A   Social History Main Topics  . Smoking status: Never Smoker  . Smokeless tobacco: Never Used  . Alcohol use 0.0 oz/week     Comment: 2 DRINKS/MONTH  . Drug use: No  . Sexual activity: Not Currently   Other Topics Concern  . Not on file   Social History Narrative   Lives in [place] with wife.  Does not routinely exercise.  Activity severely limited by bilateral knee pain.      ----------------------------------------------------------------------------------------------------------------------  ROS Review of Systems  No changes are mentioned  GI: No constipation Objective:  BP 126/81 (BP Location: Left Arm, Patient Position: Sitting, Cuff Size: Normal)   Pulse 95   Temp 97.7 F (36.5 C) (Oral)   Resp 16   Ht 5\' 10"  (1.778 m)   Wt 278 lb (126.1 kg)   SpO2 100%   BMI 39.89 kg/m   Physical Exam  No change on physical exam is noted. Heart is regular rate and rhythm Lungs are clear to also dictation Strength in the lower extremities is without changeAnd his exam appears stable  Previous MRI has revealed diffuse facet and degenerative disc disease throughout the lumbar  spine.     Assessment & Plan:   Asiel was seen today for hip pain and back pain.  Diagnoses and all orders for this visit:  Facet arthritis of lumbosacral region [facility])  Osteoarthritis of right hip, unspecified osteoarthritis type  Primary osteoarthritis of left hip  Chronic bilateral low back pain without sciatica  Other orders -     Discontinue: HYDROcodone-acetaminophen (NORCO/VICODIN) 5-325 MG tablet; Take  1 tablet by mouth 2 (two) times daily at 10 AM and 5 PM. -     HYDROcodone-acetaminophen (NORCO/VICODIN) 5-325 MG tablet; Take 1 tablet by mouth 2 (two) times daily at 10 AM and 5 PM.     ----------------------------------------------------------------------------------------------------------------------  Problem List Items Addressed This Visit    None    Visit Diagnoses    Facet arthritis of lumbosacral region [facility])    -  Primary   Relevant Medications   HYDROcodone-acetaminophen (NORCO/VICODIN) 5-325 MG tablet   Osteoarthritis of right hip, unspecified osteoarthritis type       Relevant Medications   HYDROcodone-acetaminophen (NORCO/VICODIN) 5-325 MG tablet   Primary osteoarthritis of left hip       Relevant Medications   HYDROcodone-acetaminophen (NORCO/VICODIN) 5-325 MG tablet   Chronic bilateral low back pain without sciatica       Relevant Medications   HYDROcodone-acetaminophen (NORCO/VICODIN) 5-325 MG tablet      ----------------------------------------------------------------------------------------------------------------------  1. DDD (degenerative disc disease), lumbar  -2. Facet arthritis of lumbosacral region..Once again I have encouraged him to continue with back stretching strengthening exercises and core strengthening. We will refill his medications today with return to clinic in 2 months. 3. Primary osteoarthritis of both knees 4. Osteoarthritis of right hip, unspecified osteoarthritis type  -5. Primary osteoarthritis of left hip   -   ----------------------------------------------------------------------------------------------------------------------  I am having Mr. Comer maintain his multivitamin, oxymetazoline, B-12, ferrous sulfate, apixaban, gabapentin, QUEtiapine Fumarate, simvastatin, solifenacin, venlafaxine XR, CRANBERRY CONCENTRATE PO, pantoprazole, hyoscyamine, furosemide, HYDROcodone-acetaminophen, LORazepam, Fish Oil, vitamin C, acetaminophen, B Complex Vitamins (VITAMIN B COMPLEX PO), DOCOSAHEXAENOIC ACID PO, ascorbic acid, Cranberry, MULTI-VITAMINS, pantoprazole, QUEtiapine Fumarate, solifenacin, and HYDROcodone-acetaminophen.   Meds ordered this encounter  Medications  . B Complex Vitamins (VITAMIN B COMPLEX PO)    Sig: Take by mouth.  . DOCOSAHEXAENOIC ACID PO    Sig: Take by mouth.  Marland Kitchen ascorbic acid (VITAMIN C) 250 MG tablet    Sig: Take by mouth.  . Cranberry 500 MG CAPS    Sig: Take by mouth.  . DISCONTD: HYDROcodone-acetaminophen (NORCO/VICODIN) 5-325 MG tablet    Sig: Take by mouth.  . Multiple Vitamin (MULTI-VITAMINS) TABS    Sig: Take by mouth.  . pantoprazole (PROTONIX) 40 MG tablet    Sig: Take by mouth.  . QUEtiapine Fumarate (SEROQUEL XR) 150 MG 24 hr tablet    Sig: Take by mouth.  . solifenacin (VESICARE) 10 MG tablet    Sig: Take by mouth.  . DISCONTD: HYDROcodone-acetaminophen (NORCO/VICODIN) 5-325 MG tablet    Sig: Take 1 tablet by mouth 2 (two) times daily at 10 AM and 5 PM.    Dispense:  60 tablet    Refill:  0    Do not fill until [id number]  . HYDROcodone-acetaminophen (NORCO/VICODIN) 5-325 MG tablet    Sig: Take 1 tablet by mouth 2 (two) times daily at 10 AM and 5 PM.    Dispense:  60 tablet    Refill:  0    Do not fill until [id number]       Follow-up: Return in about 2 months (around 05/19/2016) for evaluation, med refill.    [name], MD  This dictation was performed utilizing Dragon voice recognition software.  Please excuse any unintentional or mistaken  typographical errors as a result of its unedited utilization.

## 2016-03-23 ENCOUNTER — Ambulatory Visit (INDEPENDENT_AMBULATORY_CARE_PROVIDER_SITE_OTHER): Payer: BLUE CROSS/BLUE SHIELD | Admitting: Family Medicine

## 2016-03-23 ENCOUNTER — Encounter: Payer: Self-pay | Admitting: Family Medicine

## 2016-03-23 VITALS — BP 135/80 | HR 95 | Ht 71.26 in | Wt 283.0 lb

## 2016-03-23 DIAGNOSIS — I951 Orthostatic hypotension: Secondary | ICD-10-CM

## 2016-03-23 DIAGNOSIS — E785 Hyperlipidemia, unspecified: Secondary | ICD-10-CM

## 2016-03-23 DIAGNOSIS — Z1329 Encounter for screening for other suspected endocrine disorder: Secondary | ICD-10-CM

## 2016-03-23 DIAGNOSIS — Z125 Encounter for screening for malignant neoplasm of prostate: Secondary | ICD-10-CM | POA: Diagnosis not present

## 2016-03-23 DIAGNOSIS — Z0000 Encounter for general adult medical examination without abnormal findings: Secondary | ICD-10-CM

## 2016-03-23 DIAGNOSIS — I10 Essential (primary) hypertension: Secondary | ICD-10-CM | POA: Diagnosis not present

## 2016-03-23 MED ORDER — LORAZEPAM 1 MG PO TABS: 1.0000 mg | ORAL_TABLET | Freq: Every day | ORAL | 1 refills | Status: DC

## 2016-03-23 NOTE — Assessment & Plan Note (Signed)
Reviewed care and treatment increase fluids and salt for blood pressure to do Valsalva maneuvers prior to standing and on standing. Reviewed medications and potential for Seroquel to be exacerbating the symptoms. Patient may try doing half a Seroquel tablet to see if he controls his nerves sleep and does well.

## 2016-03-23 NOTE — Progress Notes (Signed)
BP 135/80 (BP Location: Right Arm, Cuff Size: Large)   Pulse 95   Ht 5' 11.26" (1.81 m)   Wt 283 lb (128.4 kg)   BMI 39.18 kg/m    Subjective:    Patient ID: Nicholas [name], male    DOB: [birth year], 71 y.o.   MRN: [id number]  HPI: [name] is a 71 y.o. male  Lightheaded Patient with lightheaded sensations been ongoing for months. Worse when he stands up and moves around doesn't happen when sitting. Patient had a pacemaker placed last month and was hopeful this will resolve the symptoms but it has not. Patient not taking any blood pressure medications otherwise is been doing well has been trying to diet some and lose some weight but hasn't noticed any low blood pressure spells or issues. Reviewed cardiology notes and blood pressure. Reviewed medication Relevant past medical, surgical, family and social history reviewed and updated as indicated. Interim medical history since our last visit reviewed. Allergies and medications reviewed and updated.  Review of Systems  Constitutional: Negative.   Respiratory: Negative.   Cardiovascular: Negative.     Per HPI unless specifically indicated above     Objective:    BP 135/80 (BP Location: Right Arm, Cuff Size: Large)   Pulse 95   Ht 5' 11.26" (1.81 m)   Wt 283 lb (128.4 kg)   BMI 39.18 kg/m   Wt Readings from Last 3 Encounters:  03/23/16 283 lb (128.4 kg)  03/21/16 278 lb (126.1 kg)  02/17/16 287 lb 1.6 oz (130.2 kg)    Physical Exam  Constitutional: He is oriented to person, place, and time. He appears well-developed and well-nourished.  HENT:  Head: Normocephalic and atraumatic.  Eyes: Conjunctivae and EOM are normal.  Neck: Normal range of motion.  Cardiovascular: Normal rate, regular rhythm and normal heart sounds.   Pulmonary/Chest: Effort normal and breath sounds normal.  Musculoskeletal: Normal range of motion.  Neurological: He is alert and oriented to person, place, and time.  Skin: No erythema.    Psychiatric: He has a normal mood and affect. His behavior is normal. Judgment and thought content normal.   patient with orthostatic tilt test from sitting to standing 20 point blood pressure drop.      Assessment & Plan:   Problem List Items Addressed This Visit      Cardiovascular and Mediastinum   HTN (hypertension) (Chronic)   Relevant Orders   Comprehensive metabolic panel   CBC with Differential/Platelet   Urinalysis, Routine w reflex microscopic   Orthostatic hypotension    Reviewed care and treatment increase fluids and salt for blood pressure to do Valsalva maneuvers prior to standing and on standing. Reviewed medications and potential for Seroquel to be exacerbating the symptoms. Patient may try doing half a Seroquel tablet to see if he controls his nerves sleep and does well.        Other   Dyslipidemia (Chronic)   Relevant Orders   Comprehensive metabolic panel   CBC with Differential/Platelet   Lipid panel   Urinalysis, Routine w reflex microscopic    Other Visit Diagnoses    Annual physical exam    -  Primary   Relevant Orders   Comprehensive metabolic panel   CBC with Differential/Platelet   Lipid panel   TSH   PSA   Urinalysis, Routine w reflex microscopic   Thyroid disorder screen       Relevant Orders   TSH   Prostate cancer screening  Relevant Orders   PSA       Follow up plan: Return for Physical Exam, As scheduled.

## 2016-03-24 ENCOUNTER — Encounter: Payer: Self-pay | Admitting: Family Medicine

## 2016-03-24 LAB — MICROSCOPIC EXAMINATION

## 2016-03-24 LAB — URINALYSIS, ROUTINE W REFLEX MICROSCOPIC
BILIRUBIN UA: NEGATIVE
GLUCOSE, UA: NEGATIVE
NITRITE UA: NEGATIVE
RBC UA: NEGATIVE
SPEC GRAV UA: 1.02 (ref 1.005–1.030)
UUROB: 0.2 mg/dL (ref 0.2–1.0)
pH, UA: 5.5 (ref 5.0–7.5)

## 2016-03-24 LAB — CBC WITH DIFFERENTIAL/PLATELET
BASOS: 1 %
Basophils Absolute: 0 10*3/uL (ref 0.0–0.2)
EOS (ABSOLUTE): 0.3 10*3/uL (ref 0.0–0.4)
EOS: 5 %
HEMATOCRIT: 46.3 % (ref 37.5–51.0)
Hemoglobin: 15.7 g/dL (ref 13.0–17.7)
IMMATURE GRANS (ABS): 0 10*3/uL (ref 0.0–0.1)
IMMATURE GRANULOCYTES: 0 %
LYMPHS: 28 %
Lymphocytes Absolute: 1.5 10*3/uL (ref 0.7–3.1)
MCH: 31.7 pg (ref 26.6–33.0)
MCHC: 33.9 g/dL (ref 31.5–35.7)
MCV: 93 fL (ref 79–97)
MONOCYTES: 10 %
Monocytes Absolute: 0.5 10*3/uL (ref 0.1–0.9)
NEUTROS PCT: 56 %
Neutrophils Absolute: 2.9 10*3/uL (ref 1.4–7.0)
Platelets: 166 10*3/uL (ref 150–379)
RBC: 4.96 x10E6/uL (ref 4.14–5.80)
RDW: 13.2 % (ref 12.3–15.4)
WBC: 5.2 10*3/uL (ref 3.4–10.8)

## 2016-03-24 LAB — COMPREHENSIVE METABOLIC PANEL
ALT: 25 IU/L (ref 0–44)
AST: 29 IU/L (ref 0–40)
Albumin/Globulin Ratio: 1.7 (ref 1.2–2.2)
Albumin: 4.6 g/dL (ref 3.5–4.8)
Alkaline Phosphatase: 53 IU/L (ref 39–117)
BUN/Creatinine Ratio: 20 (ref 10–24)
BUN: 20 mg/dL (ref 8–27)
Bilirubin Total: 0.6 mg/dL (ref 0.0–1.2)
CALCIUM: 9.8 mg/dL (ref 8.6–10.2)
CO2: 23 mmol/L (ref 18–29)
CREATININE: 0.98 mg/dL (ref 0.76–1.27)
Chloride: 103 mmol/L (ref 96–106)
GFR calc Af Amer: 90 mL/min/{1.73_m2} (ref >59)
GFR, EST NON AFRICAN AMERICAN: 78 mL/min/{1.73_m2} (ref >59)
GLOBULIN, TOTAL: 2.7 g/dL (ref 1.5–4.5)
Glucose: 82 mg/dL (ref 65–99)
Potassium: 5 mmol/L (ref 3.5–5.2)
Sodium: 144 mmol/L (ref 134–144)
Total Protein: 7.3 g/dL (ref 6.0–8.5)

## 2016-03-24 LAB — LIPID PANEL
CHOLESTEROL TOTAL: 167 mg/dL (ref 100–199)
Chol/HDL Ratio: 3.8 ratio units (ref 0.0–5.0)
HDL: 44 mg/dL (ref >39)
LDL Calculated: 94 mg/dL (ref 0–99)
TRIGLYCERIDES: 147 mg/dL (ref 0–149)
VLDL Cholesterol Cal: 29 mg/dL (ref 5–40)

## 2016-03-24 LAB — PSA: Prostate Specific Ag, Serum: 2 ng/mL (ref 0.0–4.0)

## 2016-03-24 LAB — TSH: TSH: 2.76 u[IU]/mL (ref 0.450–4.500)

## 2016-03-28 ENCOUNTER — Encounter: Payer: Self-pay | Admitting: Nurse Practitioner

## 2016-03-28 NOTE — Progress Notes (Signed)
Electrophysiology Office Note Date: 03/30/2016  ID:  Nicholas [name], DOB [birth year], MRN [id number]  PCP: Nicholas Pop, MD Electrophysiologist: Caryl Comes  CC: Pacemaker follow-up  Nicholas [name] is a 71 y.o. male seen today for Dr Caryl Comes.  He presents today for routine electrophysiology followup.  Since last being seen in our clinic, the patient reports doing reasonably well.  He has had problems with orthostatic intolerance which has improved with hydration and isometric exercises.   He denies chest pain, palpitations, dyspnea, PND, orthopnea, nausea, vomiting, syncope, edema, weight gain, or early satiety.  Device History: MDT dual chamber (His Bundle) PPM implanted 2018 for complete heart block    Past Medical History:  Diagnosis Date  . Anterior urethral stricture   . Anxiety   . Arthritis    a. knees, hips, hands;  b. 11/2013 s/p L TKA @ [facility].  . Bulging lumbar disc   . BXO (balanitis xerotica obliterans)   . 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 Depression   . Gross hematuria   . Hyperlipemia   . Hypertension    borderline  . Phimosis   . Pulmonary embolism [facility])    Past Surgical History:  Procedure Laterality Date  . BUNIONECTOMY Bilateral 01/06/2015   Procedure: BUNIONECTOMY;  Surgeon: [name], MD;  Location: [facility] ORS;  Service: Orthopedics;  Laterality: Bilateral;  . CARDIAC CATHETERIZATION  ~ 2005   "once"  . CATARACT EXTRACTION W/ INTRAOCULAR LENS  IMPLANT, BILATERAL Bilateral ~ 2010  . COLONOSCOPY WITH PROPOFOL N/A 12/07/2015   Procedure: COLONOSCOPY WITH PROPOFOL;  Surgeon: Lollie Sails, MD;  Location: [facility] ENDOSCOPY;  Service: Endoscopy;  Laterality: N/A;  . EP IMPLANTABLE DEVICE N/A 02/16/2016   MDT dual chamber (His Bundle) pacemaker implanted by Dr Caryl Comes for intermittent complete heart block  . ESOPHAGOGASTRODUODENOSCOPY (EGD) WITH PROPOFOL N/A 12/07/2015   Procedure:  ESOPHAGOGASTRODUODENOSCOPY (EGD) WITH PROPOFOL;  Surgeon: Lollie Sails, MD;  Location: [facility] ENDOSCOPY;  Service: Endoscopy;  Laterality: N/A;  . HAMMER TOE SURGERY Bilateral 01/06/2015   Procedure: HAMMER TOE CORRECTION;  Surgeon: [name], MD;  Location: [facility] ORS;  Service: Orthopedics;  Laterality: Bilateral;  . KNEE CARTILAGE SURGERY Left 1965   "football injury"  . Left Total Knee Arthroplasty     a. 11/2013 [facility].  Marland Kitchen PILONIDAL CYST EXCISION  1970's  . TOTAL HIP ARTHROPLASTY Right 2004  . TOTAL HIP ARTHROPLASTY Left 2006    Current Outpatient Prescriptions  Medication Sig Dispense Refill  . acetaminophen (TYLENOL) 500 MG tablet Take 2,000 mg by mouth daily as needed for moderate pain or headache.    Marland Kitchen apixaban (ELIQUIS) 5 MG TABS tablet Take 1 tablet (5 mg total) by mouth 2 (two) times daily. 180 tablet 4  . ascorbic acid (VITAMIN C) 250 MG tablet Take by mouth.    . Cranberry 500 MG CAPS Take by mouth.    . Cyanocobalamin (B-12) 1000 MCG CAPS Take 1,000 mcg by mouth daily.     . ferrous sulfate 325 (65 FE) MG tablet Take 325 mg by mouth daily with breakfast.    . furosemide (LASIX) 20 MG tablet Take one tablet (20 mg) daily on Tuesday/ Thursday/ Saturday/ sunday 30 tablet 2  . gabapentin (NEURONTIN) 400 MG capsule Take 1 capsule (400 mg total) by mouth 3 (three) times daily. 270 capsule 4  . HYDROcodone-acetaminophen (NORCO/VICODIN) 5-325 MG tablet Take 1 tablet by mouth 2 (  two) times daily at 10 AM and 5 PM. 60 tablet 0  . hyoscyamine (LEVSIN, ANASPAZ) 0.125 MG tablet Take 0.125 mg by mouth every 6 (six) hours as needed for bladder spasms or cramping.     Marland Kitchen LORazepam (ATIVAN) 1 MG tablet Take 1 tablet (1 mg total) by mouth at bedtime. 90 tablet 1  . Multiple Vitamin (MULTI-VITAMINS) TABS Take by mouth.    . Omega-3 Fatty Acids (FISH OIL) 1200 MG CAPS Take 1,200 mg by mouth daily.    Marland Kitchen oxymetazoline (AFRIN) 0.05 % nasal spray Place 2 sprays into both nostrils at bedtime as  needed for congestion.    . QUEtiapine Fumarate (SEROQUEL XR) 150 MG 24 hr tablet Take 1 tablet (150 mg total) by mouth at bedtime. 90 tablet 4  . simvastatin (ZOCOR) 20 MG tablet Take 1 tablet (20 mg total) by mouth daily. 90 tablet 4  . solifenacin (VESICARE) 10 MG tablet Take 1 tablet (10 mg total) by mouth daily. 90 tablet 4  . venlafaxine XR (EFFEXOR XR) 75 MG 24 hr capsule Take 1 capsule (75 mg total) by mouth daily with breakfast. 3 a day (Patient taking differently: Take 225 mg by mouth daily. ) 270 capsule 4   No current facility-administered medications for this visit.     Allergies:   Patient has no known allergies.   Social History: Social History   Social History  . Marital status: Married    Spouse name: N/A  . Number of children: N/A  . Years of education: N/A   Occupational History  . Not on file.   Social History Main Topics  . Smoking status: Never Smoker  . Smokeless tobacco: Never Used  . Alcohol use 0.0 oz/week     Comment: 2 DRINKS/MONTH  . Drug use: No  . Sexual activity: Not Currently   Other Topics Concern  . Not on file   Social History Narrative   Lives in [place] with wife.  Does not routinely exercise.  Activity severely limited by bilateral knee pain.    Family History: Family History  Problem Relation Age of Onset  . Stroke Father     deceased  . Diabetes Father   . Hypertension Father   . Alcoholism Mother     died in her 54's.  . Cancer Mother   . Cancer Sister   . Prostate cancer Paternal Grandfather      Review of Systems: All other systems reviewed and are otherwise negative except as noted above.   Physical Exam: VS:  BP 128/84   Pulse 75   Ht 5\' 11"  (1.803 m)   Wt 281 lb (127.5 kg)   BMI 39.19 kg/m  , BMI Body mass index is 39.19 kg/m.  GEN- The patient is elderly and obese appearing, alert and oriented x 3 today.   HEENT: normocephalic, atraumatic; sclera clear, conjunctiva pink; hearing intact; oropharynx clear;  neck supple  Lungs- Clear to ausculation bilaterally, normal work of breathing.  No wheezes, rales, rhonchi Heart- Regular rate and rhythm (paced) GI- soft, non-tender, non-distended, bowel sounds present  Extremities- no clubbing, cyanosis, or edema  MS- no significant deformity or atrophy Skin- warm and dry, no rash or lesion; PPM pocket well healed Psych- euthymic mood, full affect Neuro- strength and sensation are intact  PPM Interrogation- reviewed in detail today,  See PACEART report  EKG:  EKG is ordered today. The ekg ordered today shows sinus rhythm with V pacing  Recent Labs: 04/30/2015: Hemoglobin  15.2 11/19/2015: Pro B Natriuretic peptide (BNP) 77.0 03/23/2016: ALT 25; BUN 20; Creatinine, Ser 0.98; Platelets 166; Potassium 5.0; Sodium 144; TSH 2.760   Wt Readings from Last 3 Encounters:  03/30/16 281 lb (127.5 kg)  03/23/16 283 lb (128.4 kg)  03/21/16 278 lb (126.1 kg)     Other studies Reviewed: Additional studies/ records that were reviewed today include: Dr Olin Pia notes, hospital records   Assessment and Plan:  1.  Intermittent complete heart block Normal PPM function See Pace Art report No changes today His Bundle capture demonstrates non-selective pacing from 5V to 3V and selective capture from 3V to loss of capture at 0.75V.  QRS duration 134msec.    2.  Paroxysmal atrial fibrillation Burden by device interrogation 14.9% V rates controlled Continue Eliquis long term for CHADS2VASC of 2  3.  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  HTN Stable No change required today  5.  Obesity Body mass index is 39.19 kg/m. Weight loss encouraged  6.  Probable OSA The patient adamantly refuses sleep study testing We have reviewed benefits again today    Current medicines are reviewed at length with the patient today.   The patient does not have concerns regarding his medicines.  The following changes were made today:  none  Labs/ tests ordered today include:  none Orders Placed This Encounter  Procedures  . EKG 12-Lead     Disposition:   Follow up with Dr Caryl Comes as scheduled      Signed, Chanetta Marshall, NP 03/30/2016 8:38 AM  [address] [place]  [phone] (office) [phone] (fax)

## 2016-03-30 ENCOUNTER — Ambulatory Visit (INDEPENDENT_AMBULATORY_CARE_PROVIDER_SITE_OTHER): Payer: BLUE CROSS/BLUE SHIELD | Admitting: Nurse Practitioner

## 2016-03-30 VITALS — BP 128/84 | HR 75 | Ht 71.0 in | Wt 281.0 lb

## 2016-03-30 DIAGNOSIS — I10 Essential (primary) hypertension: Secondary | ICD-10-CM | POA: Diagnosis not present

## 2016-03-30 DIAGNOSIS — I480 Paroxysmal atrial fibrillation: Secondary | ICD-10-CM | POA: Diagnosis not present

## 2016-03-30 DIAGNOSIS — I442 Atrioventricular block, complete: Secondary | ICD-10-CM | POA: Diagnosis not present

## 2016-03-30 LAB — CUP PACEART INCLINIC DEVICE CHECK
Date Time Interrogation Session: 20180308084539
Implantable Lead Implant Date: 20180124
Implantable Lead Implant Date: 20180124
Implantable Lead Model: 5076
MDC IDC LEAD LOCATION: 753859
MDC IDC LEAD LOCATION: 753860
MDC IDC PG IMPLANT DT: 20180124

## 2016-03-30 NOTE — Patient Instructions (Signed)
Medication Instructions:    Your physician recommends that you continue on your current medications as directed. Please refer to the Current Medication list given to you today.   If you need a refill on your cardiac medications before your next appointment, please call your pharmacy.  Labwork: NONE ORDERED  TODAY    Testing/Procedures: NONE ORDERED  TODAY    Follow-Up: as scheduled with Dr Caryl Comes   Any Other Special Instructions Will Be Listed Below (If Applicable).

## 2016-05-09 ENCOUNTER — Ambulatory Visit: Payer: Medicare Other | Admitting: Anesthesiology

## 2016-05-12 ENCOUNTER — Encounter: Payer: Self-pay | Admitting: Anesthesiology

## 2016-05-12 ENCOUNTER — Ambulatory Visit: Payer: Medicare Other | Attending: Anesthesiology | Admitting: Anesthesiology

## 2016-05-12 VITALS — BP 126/87 | HR 84 | Temp 98.0°F | Resp 18 | Ht 71.0 in | Wt 278.0 lb

## 2016-05-12 DIAGNOSIS — M4687 Other specified inflammatory spondylopathies, lumbosacral region: Secondary | ICD-10-CM | POA: Insufficient documentation

## 2016-05-12 DIAGNOSIS — M1611 Unilateral primary osteoarthritis, right hip: Secondary | ICD-10-CM

## 2016-05-12 DIAGNOSIS — G8929 Other chronic pain: Secondary | ICD-10-CM

## 2016-05-12 DIAGNOSIS — M160 Bilateral primary osteoarthritis of hip: Secondary | ICD-10-CM | POA: Insufficient documentation

## 2016-05-12 DIAGNOSIS — M545 Low back pain: Secondary | ICD-10-CM | POA: Diagnosis not present

## 2016-05-12 DIAGNOSIS — M4697 Unspecified inflammatory spondylopathy, lumbosacral region: Secondary | ICD-10-CM | POA: Diagnosis not present

## 2016-05-12 DIAGNOSIS — M47817 Spondylosis without myelopathy or radiculopathy, lumbosacral region: Secondary | ICD-10-CM

## 2016-05-12 DIAGNOSIS — M5136 Other intervertebral disc degeneration, lumbar region: Secondary | ICD-10-CM | POA: Diagnosis not present

## 2016-05-12 DIAGNOSIS — M170 Bilateral primary osteoarthritis of knee: Secondary | ICD-10-CM | POA: Insufficient documentation

## 2016-05-12 DIAGNOSIS — M79641 Pain in right hand: Secondary | ICD-10-CM | POA: Diagnosis not present

## 2016-05-12 DIAGNOSIS — M79642 Pain in left hand: Secondary | ICD-10-CM | POA: Diagnosis not present

## 2016-05-12 MED ORDER — HYDROCODONE-ACETAMINOPHEN 5-325 MG PO TABS: 1.0000 | ORAL_TABLET | Freq: Two times a day (BID) | ORAL | 0 refills | Status: DC

## 2016-05-12 NOTE — Progress Notes (Signed)
Safety precautions to be maintained throughout the outpatient stay will include: orient to surroundings, keep bed in low position, maintain call bell within reach at all times, provide assistance with transfer out of bed and ambulation.  

## 2016-05-12 NOTE — Progress Notes (Signed)
Subjective:  Patient ID: Nicholas [name], male    DOB: 1945/04/06  Age: 71 y.o. MRN: [id number]  CC: Hand Pain (bilateral) and Back Pain (lower)  Procedure none   HPI Nicholas [name] last seen in February. He's been doing well since that time and taking his medications as prescribed. Based on the narcotic assessment sheet these seem to be working well with minimal untoward side effects. The quality characteristic distribution of his pain in the low back and legs has been stable in nature as has his hand pain.  Otherwise he is in his usual state of health today.   We've reviewed the practitioner database information and it is appropriate.   Historically, He is a pleasant 71 year old white male with a long-standing history of diffuse pain with severe degenerative arthritis and low back pain. He is referred by Dr. Golden Pop for assistance with his medication management. His pain complaints revolve around chronic low back pain and knee pain and hip pain and pain and shoulder pain. Had multiple surgeries and continues to have diffuse body pain that is currently managed with Vicodin 5 mg twice a day. This regimen seems to help keep his pain under good control and he states that nothing else has helped. The pain has gotten worse over the years with a maximum VAS of 10 at best and 8 does not appear to be influenced by time of day. Aggravating factors include bending kneeling lifting sitting standing squatting and alleviating factors include stretching cold application and medication management. He has associated spasms pain that wakes him up at night fatigue and dizziness and the pain description includes a quality of aching and increasing duration pain. He's had multiple studies to evaluate the nature of his pain and its found to be mainly consistent with osteoarthritis and degenerative joint disease.   History Nicholas [name] has a past medical history of Anterior urethral stricture; Anxiety; Arthritis; Bulging  lumbar disc; BXO (balanitis xerotica obliterans); Complete heart block ([place]); Depression; Gross hematuria; Hyperlipemia; Hypertension; Phimosis; and Pulmonary embolism ([place]).   He has a past surgical history that includes Total hip arthroplasty (Right, 2004); Total hip arthroplasty (Left, 2006); Knee cartilage surgery (Left, 1965); Cataract extraction w/ intraocular lens  implant, bilateral (Bilateral, ~ 2010); Pilonidal cyst excision (1970's); Cardiac catheterization (~ 2005); Left Total Knee Arthroplasty; Hammer toe surgery (Bilateral, 01/06/2015); Bunionectomy (Bilateral, 01/06/2015); Esophagogastroduodenoscopy (egd) with propofol (N/A, 12/07/2015); Colonoscopy with propofol (N/A, 12/07/2015); and Cardiac catheterization (N/A, 02/16/2016).   His family history includes Alcoholism in his mother; Cancer in his mother and sister; Diabetes in his father; Hypertension in his father; Prostate cancer in his paternal grandfather; Stroke in his father.He reports that he has never smoked. He has never used smokeless tobacco. He reports that he drinks alcohol. He reports that he does not use drugs.  No results found for this or any previous visit.  Tox[employer] Select 13  Date Value Ref Range Status  01/20/2016 FINAL  Final    Comment:    ==================================================================== TOXASSURE SELECT 13 (MW) ==================================================================== Test                             Result       Flag       Units Drug Present and Declared for Prescription Verification   Lorazepam                      490  EXPECTED   ng/mg creat    Source of lorazepam is a scheduled prescription medication.   Hydrocodone                    1757         EXPECTED   ng/mg creat   Hydromorphone                  150          EXPECTED   ng/mg creat   Dihydrocodeine                 161          EXPECTED   ng/mg creat   Norhydrocodone                 1630         EXPECTED    ng/mg creat    Sources of hydrocodone include scheduled prescription    medications. Hydromorphone, dihydrocodeine and norhydrocodone are    expected metabolites of hydrocodone. Hydromorphone and    dihydrocodeine are also available as scheduled prescription    medications. ==================================================================== Test                      Result    Flag   Units      Ref Range   Creatinine              279              mg/dL      >=20 ==================================================================== Declared Medications:  The flagging and interpretation on this report are based on the  following declared medications.  Unexpected results may arise from  inaccuracies in the declared medications.  **Note: The testing scope of this panel includes these medications:  Hydrocodone (Norco)  Lorazepam (Ativan)  **Note: The testing scope of this panel does not include following  reported medications:  Acetaminophen (Norco)  Amoxicillin  Apixaban (Eliquis)  Cholecalciferol  Cyanocobalamin  Furosemide (Lasix)  Gabapentin (Neurontin)  Hyoscyamine  Iron (Ferrous Sulfate)  Multivitamin  Omega-3 Fatty Acids (Fish Oil)  Oxymetazoline (Afrin)  Pantoprazole (Protonix)  Quetiapine (Seroquel)  Simvastatin  Solifenacin (Vesicare)  Supplement  Venlafaxine (Effexor)  Vitamin C ==================================================================== For clinical consultation, please call [phone]. ====================================================================       ---------------------------------------------------------------------------------------------------------------------- Past Medical History:  Diagnosis Date  . Anterior urethral stricture   . Anxiety   . Arthritis    a. knees, hips, hands;  b. 11/2013 s/p L TKA @ [facility].  . Bulging lumbar disc   . BXO (balanitis xerotica obliterans)   . Complete heart block (HCC)    a. s/p MDT dual  chamber (His bundle) pacemaker 01/2016 Dr Caryl Comes  . Depression   . Gross hematuria   . Hyperlipemia   . Hypertension    borderline  . Phimosis   . Pulmonary embolism [facility])     Past Surgical History:  Procedure Laterality Date  . BUNIONECTOMY Bilateral 01/06/2015   Procedure: BUNIONECTOMY;  Surgeon: [name], MD;  Location: [facility] ORS;  Service: Orthopedics;  Laterality: Bilateral;  . CARDIAC CATHETERIZATION  ~ 2005   "once"  . CATARACT EXTRACTION W/ INTRAOCULAR LENS  IMPLANT, BILATERAL Bilateral ~ 2010  . COLONOSCOPY WITH PROPOFOL N/A 12/07/2015   Procedure: COLONOSCOPY WITH PROPOFOL;  Surgeon: Lollie Sails, MD;  Location: [facility] ENDOSCOPY;  Service: Endoscopy;  Laterality: N/A;  . EP IMPLANTABLE DEVICE N/A 02/16/2016   MDT dual chamber (  His Bundle) pacemaker implanted by Dr Caryl Comes for intermittent complete heart block  . ESOPHAGOGASTRODUODENOSCOPY (EGD) WITH PROPOFOL N/A 12/07/2015   Procedure: ESOPHAGOGASTRODUODENOSCOPY (EGD) WITH PROPOFOL;  Surgeon: Lollie Sails, MD;  Location: [facility] ENDOSCOPY;  Service: Endoscopy;  Laterality: N/A;  . HAMMER TOE SURGERY Bilateral 01/06/2015   Procedure: HAMMER TOE CORRECTION;  Surgeon: [name], MD;  Location: [facility] ORS;  Service: Orthopedics;  Laterality: Bilateral;  . KNEE CARTILAGE SURGERY Left 1965   "football injury"  . Left Total Knee Arthroplasty     a. 11/2013 [facility].  Marland Kitchen PILONIDAL CYST EXCISION  1970's  . TOTAL HIP ARTHROPLASTY Right 2004  . TOTAL HIP ARTHROPLASTY Left 2006    Family History  Problem Relation Age of Onset  . Stroke Father     deceased  . Diabetes Father   . Hypertension Father   . Alcoholism Mother     died in her 55's.  . Cancer Mother   . Cancer Sister   . Prostate cancer Paternal Grandfather     Social History  Substance Use Topics  . Smoking status: Never Smoker  . Smokeless tobacco: Never Used  . Alcohol use 0.0 oz/week     Comment: 2 DRINKS/MONTH     ---------------------------------------------------------------------------------------------------------------------- Social History   Social History  . Marital status: Married    Spouse name: N/A  . Number of children: N/A  . Years of education: N/A   Social History Main Topics  . Smoking status: Never Smoker  . Smokeless tobacco: Never Used  . Alcohol use 0.0 oz/week     Comment: 2 DRINKS/MONTH  . Drug use: No  . Sexual activity: Not Currently   Other Topics Concern  . None   Social History Narrative   Lives in [place] with wife.  Does not routinely exercise.  Activity severely limited by bilateral knee pain.      ----------------------------------------------------------------------------------------------------------------------  ROS Review of Systems  No changes are mentioned  GI: No constipation Objective:  BP 126/87   Pulse 84   Temp 98 F (36.7 C)   Resp 18   Ht 5\' 11"  (1.803 m)   Wt 278 lb (126.1 kg)   SpO2 96%   BMI 38.77 kg/m   Physical Exam  No change on physical exam is noted. Heart is regular rate and rhythm Lungs are clear to also dictation Strength in the lower extremities is without change and his exam appears stable  Previous MRI has revealed diffuse facet and degenerative disc disease throughout the lumbar spine.     Assessment & Plan:   Nicholas [name] was seen today for hand pain and back pain.  Diagnoses and all orders for this visit:  Facet arthritis of lumbosacral region [facility])  Osteoarthritis of right hip, unspecified osteoarthritis type  Chronic bilateral low back pain without sciatica  DDD (degenerative disc disease), lumbar  Other orders -     Discontinue: HYDROcodone-acetaminophen (NORCO/VICODIN) 5-325 MG tablet; Take 1 tablet by mouth 2 (two) times daily at 10 AM and 5 PM. -     HYDROcodone-acetaminophen (NORCO/VICODIN) 5-325 MG tablet; Take 1 tablet by mouth 2 (two) times daily at 10 AM and 5  PM.     ----------------------------------------------------------------------------------------------------------------------  Problem List Items Addressed This Visit    None    Visit Diagnoses    Facet arthritis of lumbosacral region [facility])    -  Primary   Relevant Medications   HYDROcodone-acetaminophen (NORCO/VICODIN) 5-325 MG tablet   Osteoarthritis of right hip, unspecified osteoarthritis type  Relevant Medications   HYDROcodone-acetaminophen (NORCO/VICODIN) 5-325 MG tablet   Chronic bilateral low back pain without sciatica       Relevant Medications   HYDROcodone-acetaminophen (NORCO/VICODIN) 5-325 MG tablet   DDD (degenerative disc disease), lumbar       Relevant Medications   HYDROcodone-acetaminophen (NORCO/VICODIN) 5-325 MG tablet      ----------------------------------------------------------------------------------------------------------------------  1. DDD (degenerative disc disease), lumbar Continue back stretching strengthening exercises and efforts at weight loss. He is currently  -2. Facet arthritis of lumbosacral region..Once again I have encouraged him to continue with back stretching strengthening exercises and core strengthening. We will refill his medications today with return to clinic in 2 months. 3. Primary osteoarthritis of both knees 4. Osteoarthritis of right hip, unspecified osteoarthritis type  -5. Primary osteoarthritis of left hip Refills for his medications were given today. We have reviewed the [place] and his usage of these medications as appropriate.   -  ----------------------------------------------------------------------------------------------------------------------  I am having Nicholas [name] maintain his oxymetazoline, B-12, ferrous sulfate, apixaban, gabapentin, QUEtiapine Fumarate, simvastatin, solifenacin, venlafaxine XR, hyoscyamine, furosemide, Fish Oil, acetaminophen, ascorbic acid, Cranberry,  MULTI-VITAMINS, LORazepam, and HYDROcodone-acetaminophen.   Meds ordered this encounter  Medications  . DISCONTD: HYDROcodone-acetaminophen (NORCO/VICODIN) 5-325 MG tablet    Sig: Take 1 tablet by mouth 2 (two) times daily at 10 AM and 5 PM.    Dispense:  60 tablet    Refill:  0    Do not fill until [id number]  . HYDROcodone-acetaminophen (NORCO/VICODIN) 5-325 MG tablet    Sig: Take 1 tablet by mouth 2 (two) times daily at 10 AM and 5 PM.    Dispense:  60 tablet    Refill:  0    Do not fill until [id number]       Follow-up: Return in about 2 months (around 07/12/2016) for evaluation, med refill.    [name], MD  This dictation was performed utilizing Dragon voice recognition software.  Please excuse any unintentional or mistaken typographical errors as a result of its unedited utilization.

## 2016-05-16 ENCOUNTER — Encounter: Payer: Self-pay | Admitting: Family Medicine

## 2016-05-16 ENCOUNTER — Ambulatory Visit (INDEPENDENT_AMBULATORY_CARE_PROVIDER_SITE_OTHER): Payer: Medicare Other | Admitting: Family Medicine

## 2016-05-16 VITALS — BP 128/87 | HR 90 | Ht 71.0 in | Wt 280.0 lb

## 2016-05-16 DIAGNOSIS — T84098D Other mechanical complication of other internal joint prosthesis, subsequent encounter: Secondary | ICD-10-CM

## 2016-05-16 DIAGNOSIS — I10 Essential (primary) hypertension: Secondary | ICD-10-CM

## 2016-05-16 DIAGNOSIS — K219 Gastro-esophageal reflux disease without esophagitis: Secondary | ICD-10-CM | POA: Diagnosis not present

## 2016-05-16 DIAGNOSIS — I442 Atrioventricular block, complete: Secondary | ICD-10-CM | POA: Diagnosis not present

## 2016-05-16 DIAGNOSIS — E785 Hyperlipidemia, unspecified: Secondary | ICD-10-CM

## 2016-05-16 DIAGNOSIS — Z96649 Presence of unspecified artificial hip joint: Secondary | ICD-10-CM | POA: Diagnosis not present

## 2016-05-16 DIAGNOSIS — F3342 Major depressive disorder, recurrent, in full remission: Secondary | ICD-10-CM | POA: Diagnosis not present

## 2016-05-16 DIAGNOSIS — I700 Atherosclerosis of aorta: Secondary | ICD-10-CM

## 2016-05-16 DIAGNOSIS — F419 Anxiety disorder, unspecified: Secondary | ICD-10-CM

## 2016-05-16 MED ORDER — GABAPENTIN 400 MG PO CAPS: 400.0000 mg | ORAL_CAPSULE | Freq: Three times a day (TID) | ORAL | 4 refills | Status: DC

## 2016-05-16 MED ORDER — SOLIFENACIN SUCCINATE 10 MG PO TABS: 10.0000 mg | ORAL_TABLET | Freq: Every day | ORAL | 4 refills | Status: DC

## 2016-05-16 MED ORDER — SIMVASTATIN 20 MG PO TABS: 20.0000 mg | ORAL_TABLET | Freq: Every day | ORAL | 4 refills | Status: DC

## 2016-05-16 MED ORDER — QUETIAPINE FUMARATE ER 150 MG PO TB24: 150.0000 mg | ORAL_TABLET | Freq: Every day | ORAL | 4 refills | Status: DC

## 2016-05-16 MED ORDER — VENLAFAXINE HCL ER 75 MG PO CP24: 225.0000 mg | ORAL_CAPSULE | Freq: Every day | ORAL | 4 refills | Status: DC

## 2016-05-16 NOTE — Assessment & Plan Note (Signed)
stable °

## 2016-05-16 NOTE — Assessment & Plan Note (Signed)
Doing well on medications especially with back on Seroquel 150 mg.

## 2016-05-16 NOTE — Assessment & Plan Note (Signed)
The current medical regimen is effective;  continue present plan and medications.  

## 2016-05-16 NOTE — Progress Notes (Signed)
BP 128/87   Pulse 90   Ht 5\' 11"  (1.803 m)   Wt 280 lb (127 kg)   SpO2 99%   BMI 39.05 kg/m    Subjective:    Patient ID: Nicholas [name], male    DOB: August 09, 1945, 71 y.o.   MRN: [id number]  HPI: Nicholas [name] is a 71 y.o. male  Annual exam  Chief Complaint  Patient presents with  . Follow-up    Mood/Seroquel  Patient's lightheaded dizziness mostly from listening is orthostatic hypotension was largely getting better on its own. Patient's reduced dose of Seroquel messed up his mood and his back on full dose Seroquel with his mood recovering. Patient's able to manage his orthostatic symptoms. Also has recovered from various surgeries and joint replacement. Pacemaker is doing well able to play 18 holes of golf and doing very well.  Chronic pain management being done by the pain Center and stable doing well.  Taking lorazepam 1 mg at bedtime for sleep if he doesn't take it does not sleep at all but otherwise doing well is no increased usage at all over the years Will continue lorazepam 1 mg at bedtime long-term.    Relevant past medical, surgical, family and social history reviewed and updated as indicated. Interim medical history since our last visit reviewed. Allergies and medications reviewed and updated.  Review of Systems  Constitutional: Negative.   HENT: Negative.   Eyes: Negative.   Respiratory: Negative.   Cardiovascular: Negative.   Gastrointestinal: Negative.   Endocrine: Negative.   Genitourinary: Negative.   Musculoskeletal: Negative.   Skin: Negative.   Allergic/Immunologic: Negative.   Neurological: Negative.   Hematological: Negative.   Psychiatric/Behavioral: Negative.     Per HPI unless specifically indicated above     Objective:    BP 128/87   Pulse 90   Ht 5\' 11"  (1.803 m)   Wt 280 lb (127 kg)   SpO2 99%   BMI 39.05 kg/m   Wt Readings from Last 3 Encounters:  05/16/16 280 lb (127 kg)  05/12/16 278 lb (126.1 kg)  03/30/16 281 lb (127.5  kg)    Physical Exam  Constitutional: He is oriented to person, place, and time. He appears well-developed and well-nourished.  HENT:  Head: Normocephalic and atraumatic.  Right Ear: External ear normal.  Left Ear: External ear normal.  Eyes: Conjunctivae and EOM are normal. Pupils are equal, round, and reactive to light.  Neck: Normal range of motion. Neck supple.  Cardiovascular: Normal rate, regular rhythm, normal heart sounds and intact distal pulses.   Pulmonary/Chest: Effort normal and breath sounds normal.  Abdominal: Soft. Bowel sounds are normal. There is no splenomegaly or hepatomegaly.  Genitourinary: Rectum normal, prostate normal and penis normal.  Musculoskeletal: Normal range of motion.  Neurological: He is alert and oriented to person, place, and time. He has normal reflexes.  Skin: No rash noted. No erythema.  Psychiatric: He has a normal mood and affect. His behavior is normal. Judgment and thought content normal.        Assessment & Plan:   Problem List Items Addressed This Visit      Cardiovascular and Mediastinum   HTN (hypertension) (Chronic)    The current medical regimen is effective;  continue present plan and medications.       Relevant Medications   simvastatin (ZOCOR) 20 MG tablet   Abdominal aortic atherosclerosis (HCC)    stable      Relevant Medications   simvastatin (  ZOCOR) 20 MG tablet   Complete heart block ([place])    Pacemaker doing well.      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       Relevant Medications   simvastatin  (ZOCOR) 20 MG tablet   Depression    Doing well on medications especially with back on Seroquel 150 mg.      Relevant Medications   QUEtiapine Fumarate (SEROQUEL XR) 150 MG 24 hr tablet   venlafaxine XR (EFFEXOR XR) 75 MG 24 hr capsule   Anxiety    discuss anxiety and insomnia will continue lorazepam 1 mg long-term patient's use stable.      Relevant Medications   QUEtiapine Fumarate (SEROQUEL XR) 150 MG 24 hr tablet   venlafaxine XR (EFFEXOR XR) 75 MG 24 hr capsule       Follow up plan: Return in about 3 months (around 08/15/2016) for BMP,  Lipids, ALT, AST.

## 2016-05-16 NOTE — Assessment & Plan Note (Signed)
discuss anxiety and insomnia will continue lorazepam 1 mg long-term patient's use stable.

## 2016-05-16 NOTE — Assessment & Plan Note (Signed)
Pacemaker doing well.

## 2016-05-16 NOTE — Assessment & Plan Note (Signed)
Hip doing well

## 2016-05-23 ENCOUNTER — Ambulatory Visit (INDEPENDENT_AMBULATORY_CARE_PROVIDER_SITE_OTHER): Payer: Medicare Other | Admitting: Internal Medicine

## 2016-05-23 ENCOUNTER — Encounter: Payer: Self-pay | Admitting: Internal Medicine

## 2016-05-23 ENCOUNTER — Encounter: Payer: Medicare Other | Admitting: Internal Medicine

## 2016-05-23 VITALS — BP 118/70 | HR 63 | Ht 71.0 in | Wt 284.2 lb

## 2016-05-23 DIAGNOSIS — I442 Atrioventricular block, complete: Secondary | ICD-10-CM | POA: Diagnosis not present

## 2016-05-23 DIAGNOSIS — Z950 Presence of cardiac pacemaker: Secondary | ICD-10-CM

## 2016-05-23 DIAGNOSIS — I441 Atrioventricular block, second degree: Secondary | ICD-10-CM

## 2016-05-23 LAB — CUP PACEART INCLINIC DEVICE CHECK
Battery Remaining Longevity: 73 mo
Battery Voltage: 3.01 V
Brady Statistic AP VS Percent: 0.01 %
Brady Statistic RA Percent Paced: 37.93 %
Brady Statistic RV Percent Paced: 99.47 %
Implantable Lead Implant Date: 20180124
Implantable Lead Implant Date: 20180124
Implantable Lead Location: 753860
Implantable Lead Model: 3830
Implantable Pulse Generator Implant Date: 20180124
Lead Channel Impedance Value: 361 Ohm
Lead Channel Pacing Threshold Amplitude: 1.25 V
Lead Channel Pacing Threshold Pulse Width: 0.4 ms
Lead Channel Sensing Intrinsic Amplitude: 1.4 mV
Lead Channel Setting Pacing Amplitude: 2 V
Lead Channel Setting Pacing Pulse Width: 1 ms
MDC IDC LEAD LOCATION: 753859
MDC IDC MSMT LEADCHNL RA IMPEDANCE VALUE: 475 Ohm
MDC IDC MSMT LEADCHNL RA PACING THRESHOLD AMPLITUDE: 0.5 V
MDC IDC MSMT LEADCHNL RA SENSING INTR AMPL: 6 mV
MDC IDC MSMT LEADCHNL RV IMPEDANCE VALUE: 323 Ohm
MDC IDC MSMT LEADCHNL RV IMPEDANCE VALUE: 494 Ohm
MDC IDC MSMT LEADCHNL RV PACING THRESHOLD PULSEWIDTH: 1 ms
MDC IDC SESS DTM: 20180501145156
MDC IDC SET LEADCHNL RV PACING AMPLITUDE: 2.5 V
MDC IDC SET LEADCHNL RV SENSING SENSITIVITY: 0.9 mV
MDC IDC STAT BRADY AP VP PERCENT: 40.33 %
MDC IDC STAT BRADY AS VP PERCENT: 59.25 %
MDC IDC STAT BRADY AS VS PERCENT: 0.41 %

## 2016-05-23 NOTE — Progress Notes (Signed)
Patient Care Team: [name], MD as PCP - General (Family Medicine) [name], MD (Specialist) Hollice Espy, MD as Consulting Physician (Urology) Tanda Rockers, MD as Consulting Physician (Pulmonary Disease) Dionisio David, MD as Consulting Physician (Cardiology)   HPI  Nicholas [name] is a 71 y.o. male Seen in follow-up for His bundle pacemaker implanted 1/18 for symptomatic bradycardia  and profound first degree AV block with intermittent third and second-degree heart block   Exercise tolerance is much improved. He has a little bit of edema.  He continues to trouble with orthostatic intolerance which is most notable coming from his recliner and less so from his bed    DATE TEST    10/15    echo   EF 50 %   Mild RV dysfunction   11/17/     echo   EF 60 %    Nl RV function   11/17 Myoview EF 27 [facility])      His also had a history of pulmonary embolism with repeat venous Dopplers 1/16 demonstrated small clots. He has been managed with chronic apixaban      Past Medical History:  Diagnosis Date  . Anterior urethral stricture   . Anxiety   . Arthritis    a. knees, hips, hands;  b. 11/2013 s/p L TKA @ [place].  . Bulging lumbar disc   . BXO (balanitis xerotica obliterans)   . 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 Depression   . Gross hematuria   . Hyperlipemia   . Hypertension    borderline  . Phimosis   . Pulmonary embolism [facility])     Past Surgical History:  Procedure Laterality Date  . BUNIONECTOMY Bilateral 01/06/2015   Procedure: BUNIONECTOMY;  Surgeon: [name], MD;  Location: [facility] ORS;  Service: Orthopedics;  Laterality: Bilateral;  . CARDIAC CATHETERIZATION  ~ 2005   "once"  . CATARACT EXTRACTION W/ INTRAOCULAR LENS  IMPLANT, BILATERAL Bilateral ~ 2010  . COLONOSCOPY WITH PROPOFOL N/A 12/07/2015   Procedure: COLONOSCOPY WITH PROPOFOL;  Surgeon: Lollie Sails, MD;  Location:  [facility] ENDOSCOPY;  Service: Endoscopy;  Laterality: N/A;  . EP IMPLANTABLE DEVICE N/A 02/16/2016   MDT dual chamber (His Bundle) pacemaker implanted by Dr Caryl Comes for intermittent complete heart block  . ESOPHAGOGASTRODUODENOSCOPY (EGD) WITH PROPOFOL N/A 12/07/2015   Procedure: ESOPHAGOGASTRODUODENOSCOPY (EGD) WITH PROPOFOL;  Surgeon: Lollie Sails, MD;  Location: [facility] ENDOSCOPY;  Service: Endoscopy;  Laterality: N/A;  . HAMMER TOE SURGERY Bilateral 01/06/2015   Procedure: HAMMER TOE CORRECTION;  Surgeon: [name], MD;  Location: [facility] ORS;  Service: Orthopedics;  Laterality: Bilateral;  . KNEE CARTILAGE SURGERY Left 1965   "football injury"  . Left Total Knee Arthroplasty     a. 11/2013 [facility].  Marland Kitchen PILONIDAL CYST EXCISION  1970's  . TOTAL HIP ARTHROPLASTY Right 2004  . TOTAL HIP ARTHROPLASTY Left 2006    Current Outpatient Prescriptions  Medication Sig Dispense Refill  . acetaminophen (TYLENOL) 500 MG tablet Take 2,000 mg by mouth daily as needed for moderate pain or headache.    Marland Kitchen apixaban (ELIQUIS) 5 MG TABS tablet Take 1 tablet (5 mg total) by mouth 2 (two) times daily. 180 tablet 4  . ascorbic acid (VITAMIN C) 250 MG tablet Take by mouth.    . Cranberry 500 MG CAPS Take by mouth.    . Cyanocobalamin (B-12) 1000 MCG  CAPS Take 1,000 mcg by mouth daily.     . ferrous sulfate 325 (65 FE) MG tablet Take 325 mg by mouth daily with breakfast.    . gabapentin (NEURONTIN) 400 MG capsule Take 1 capsule (400 mg total) by mouth 3 (three) times daily. 270 capsule 4  . HYDROcodone-acetaminophen (NORCO/VICODIN) 5-325 MG tablet Take 1 tablet by mouth 2 (two) times daily at 10 AM and 5 PM. 60 tablet 0  . hyoscyamine (LEVSIN, ANASPAZ) 0.125 MG tablet Take 0.125 mg by mouth every 6 (six) hours as needed for bladder spasms or cramping.     Marland Kitchen LORazepam (ATIVAN) 1 MG tablet Take 1 tablet (1 mg total) by mouth at bedtime. 90 tablet 1  . Multiple Vitamin (MULTI-VITAMINS) TABS Take by mouth.    . Omega-3  Fatty Acids (FISH OIL) 1200 MG CAPS Take 1,200 mg by mouth daily.    Marland Kitchen oxymetazoline (AFRIN) 0.05 % nasal spray Place 2 sprays into both nostrils at bedtime as needed for congestion.    . QUEtiapine Fumarate (SEROQUEL XR) 150 MG 24 hr tablet Take 1 tablet (150 mg total) by mouth at bedtime. 90 tablet 4  . simvastatin (ZOCOR) 20 MG tablet Take 1 tablet (20 mg total) by mouth daily. 90 tablet 4  . solifenacin (VESICARE) 10 MG tablet Take 1 tablet (10 mg total) by mouth daily. 90 tablet 4  . venlafaxine XR (EFFEXOR XR) 75 MG 24 hr capsule Take 3 capsules (225 mg total) by mouth daily. 270 capsule 4  . furosemide (LASIX) 20 MG tablet Take one tablet (20 mg) daily on Tuesday/ Thursday/ Saturday/ sunday (Patient not taking: Reported on 05/23/2016) 30 tablet 2   No current facility-administered medications for this visit.     No Known Allergies    Review of Systems negative except from HPI and PMH  Physical Exam BP 118/70 (BP Location: Left Arm, Patient Position: Sitting, Cuff Size: Large)   Pulse 63   Ht 5\' 11"  (1.803 m)   Wt 284 lb 4 oz (128.9 kg)   BMI 39.64 kg/m  Well developed and Morbidly obese  in no acute distress HENT normal E scleral and icterus clear Neck Supple .Device pocket well healed; without hematoma or erythema.  There is no tethering  JVp flat carotids brisk and full Clear to ausculation Regular rate and rhythm, no murmurs gallops or rub Soft with active bowel sounds No clubbing cyanosis Trace Edema Alert and oriented, grossly normal motor and sensory function Skin Warm and Dry  ECG personally reviewed sinus rhythm with PVC synchronous nonselective His bundle pacing  Holter was personally reviewed as described above  Assessment and  Plan  His bundle pacing with selective. 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  We have reviewed the physiology of orthostatic intolerance. They have corroborated this at home with blood pressure measurements. He is not on any antihypertensive; he takes few diuretics. Given his orthostasis and his mild edema, I think avoiding diuretics at this point to make sense  We have reviewed the role of isometric contraction & potential benefit of an abdominal binder  Current medicines are reviewed at length with the patient today .  The patient does not  have concerns regarding medicines.

## 2016-05-23 NOTE — Patient Instructions (Signed)
Medication Instructions: - Your physician recommends that you continue on your current medications as directed. Please refer to the Current Medication list given to you today.  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 This monitoring reduces the number of office visits required to check your device to one time per year. It allows us to keep an eye on the functioning of your device to ensure it is working properly. You are scheduled for a device check from home on 08/22/16. You may send your transmission at any time that day. If you have a wireless device, the transmission will be sent automatically. After your physician reviews your transmission, you will receive a postcard with your next transmission date.  - Your physician wants you to follow-up in: 9 months with Dr. Klein. You will receive a reminder letter in the mail two months in advance. If you don't receive a letter, please call our office to schedule the follow-up appointment.   Any Additional Special Instructions Will Be Listed Below (If Applicable).     If you need a refill on your cardiac medications before your next appointment, please call your pharmacy.   

## 2016-06-02 ENCOUNTER — Other Ambulatory Visit: Payer: Self-pay | Admitting: Family Medicine

## 2016-06-07 ENCOUNTER — Telehealth: Payer: Self-pay

## 2016-06-07 NOTE — Telephone Encounter (Signed)
Pt says he lost a prescription for hydrocodone. Pt says he is having work done on his house and must have misplaced the script

## 2016-06-08 NOTE — Telephone Encounter (Signed)
Patient states he does not have his hydrocodone- apap Rx's that was written on 05/12/16 that was to fill on 06/05/16 and 07/05/16.  Instructed patient that we do not replaced lost Rx's.  Patient verbalizes u/o information.

## 2016-06-08 NOTE — Telephone Encounter (Signed)
Called patient and instructed him that we cannot replace prescriptions. Told him to search again and to call back if he cannot find them and I would speak with Dr. Andree Elk about it.

## 2016-06-09 NOTE — Telephone Encounter (Signed)
Patient called and stated he found his scripts.

## 2016-06-14 ENCOUNTER — Other Ambulatory Visit: Payer: Self-pay | Admitting: Family Medicine

## 2016-06-20 ENCOUNTER — Other Ambulatory Visit: Payer: Self-pay | Admitting: Family Medicine

## 2016-06-20 DIAGNOSIS — F3342 Major depressive disorder, recurrent, in full remission: Secondary | ICD-10-CM

## 2016-06-20 DIAGNOSIS — F419 Anxiety disorder, unspecified: Secondary | ICD-10-CM

## 2016-06-29 ENCOUNTER — Encounter: Payer: Self-pay | Admitting: Anesthesiology

## 2016-06-29 ENCOUNTER — Ambulatory Visit: Payer: Medicare Other | Attending: Anesthesiology | Admitting: Anesthesiology

## 2016-06-29 VITALS — BP 150/85 | HR 71 | Temp 98.4°F | Ht 71.0 in | Wt 284.0 lb

## 2016-06-29 DIAGNOSIS — M1611 Unilateral primary osteoarthritis, right hip: Secondary | ICD-10-CM | POA: Insufficient documentation

## 2016-06-29 DIAGNOSIS — M5136 Other intervertebral disc degeneration, lumbar region: Secondary | ICD-10-CM | POA: Diagnosis not present

## 2016-06-29 DIAGNOSIS — E785 Hyperlipidemia, unspecified: Secondary | ICD-10-CM | POA: Insufficient documentation

## 2016-06-29 DIAGNOSIS — G8929 Other chronic pain: Secondary | ICD-10-CM

## 2016-06-29 DIAGNOSIS — M79643 Pain in unspecified hand: Secondary | ICD-10-CM

## 2016-06-29 DIAGNOSIS — M4687 Other specified inflammatory spondylopathies, lumbosacral region: Secondary | ICD-10-CM | POA: Diagnosis not present

## 2016-06-29 DIAGNOSIS — Z950 Presence of cardiac pacemaker: Secondary | ICD-10-CM | POA: Insufficient documentation

## 2016-06-29 DIAGNOSIS — M4697 Unspecified inflammatory spondylopathy, lumbosacral region: Secondary | ICD-10-CM | POA: Diagnosis not present

## 2016-06-29 DIAGNOSIS — Z79891 Long term (current) use of opiate analgesic: Secondary | ICD-10-CM | POA: Diagnosis not present

## 2016-06-29 DIAGNOSIS — G894 Chronic pain syndrome: Secondary | ICD-10-CM | POA: Diagnosis not present

## 2016-06-29 DIAGNOSIS — M79642 Pain in left hand: Secondary | ICD-10-CM | POA: Diagnosis not present

## 2016-06-29 DIAGNOSIS — M5432 Sciatica, left side: Secondary | ICD-10-CM | POA: Diagnosis not present

## 2016-06-29 DIAGNOSIS — M170 Bilateral primary osteoarthritis of knee: Secondary | ICD-10-CM | POA: Insufficient documentation

## 2016-06-29 DIAGNOSIS — Z7901 Long term (current) use of anticoagulants: Secondary | ICD-10-CM | POA: Insufficient documentation

## 2016-06-29 DIAGNOSIS — M47817 Spondylosis without myelopathy or radiculopathy, lumbosacral region: Secondary | ICD-10-CM

## 2016-06-29 DIAGNOSIS — I10 Essential (primary) hypertension: Secondary | ICD-10-CM | POA: Diagnosis not present

## 2016-06-29 DIAGNOSIS — M1612 Unilateral primary osteoarthritis, left hip: Secondary | ICD-10-CM | POA: Insufficient documentation

## 2016-06-29 DIAGNOSIS — M5431 Sciatica, right side: Secondary | ICD-10-CM

## 2016-06-29 DIAGNOSIS — F329 Major depressive disorder, single episode, unspecified: Secondary | ICD-10-CM | POA: Diagnosis not present

## 2016-06-29 DIAGNOSIS — M79641 Pain in right hand: Secondary | ICD-10-CM | POA: Insufficient documentation

## 2016-06-29 DIAGNOSIS — Z79899 Other long term (current) drug therapy: Secondary | ICD-10-CM | POA: Diagnosis not present

## 2016-06-29 DIAGNOSIS — F419 Anxiety disorder, unspecified: Secondary | ICD-10-CM | POA: Diagnosis not present

## 2016-06-29 DIAGNOSIS — F1190 Opioid use, unspecified, uncomplicated: Secondary | ICD-10-CM | POA: Diagnosis not present

## 2016-06-29 MED ORDER — HYDROCODONE-ACETAMINOPHEN 5-325 MG PO TABS: 1.0000 | ORAL_TABLET | Freq: Two times a day (BID) | ORAL | 0 refills | Status: DC

## 2016-06-29 NOTE — Progress Notes (Signed)
Nursing Pain Medication Assessment:  Safety precautions to be maintained throughout the outpatient stay will include: orient to surroundings, keep bed in low position, maintain call bell within reach at all times, provide assistance with transfer out of bed and ambulation.  Medication Inspection Compliance: Pill count conducted under aseptic conditions, in front of the patient. Neither the pills nor the bottle was removed from the patient's sight at any time. Once count was completed pills were immediately returned to the patient in their original bottle.  Medication: Hydrocodone/APAP Pill/Patch Count: 22 of 60 pills remain Pill/Patch Appearance: Markings consistent with prescribed medication Bottle Appearance: Standard pharmacy container. Clearly labeled. Filled Date:05/17/ 2018 Last Medication intake:  Today

## 2016-06-29 NOTE — Patient Instructions (Signed)
You were given one prescription for Hydrocodone today,  You can take Naprosyn as needed. You make begin with 2 at night.

## 2016-06-30 NOTE — Progress Notes (Signed)
Subjective:  Patient ID: Nicholas [name], male    DOB: 07-07-1945  Age: 71 y.o. MRN: [id number]  CC: Hand Pain (all 10 fingers)  Procedure none Service Provided on Last Visit: Med Refill HPI Nicholas [name] was last seen in April and he appears to be doing relatively well. His pain in the lumbar region is at baseline with no significant changes noted at this time. Otherwise he is in his usual state of health at this point. He is tolerating his medications well and has shown no evidence of diverting or illicit use. He continues to have severe hand and low back pain.  We've reviewed the practitioner database information and it is appropriate.   Historically, He is a pleasant 71 year old white male with a long-standing history of diffuse pain with severe degenerative arthritis and low back pain. He is referred by Dr. Golden Pop for assistance with his medication management. His pain complaints revolve around chronic low back pain and knee pain and hip pain and pain and shoulder pain. Had multiple surgeries and continues to have diffuse body pain that is currently managed with Vicodin 5 mg twice a day. This regimen seems to help keep his pain under good control and he states that nothing else has helped. The pain has gotten worse over the years with a maximum VAS of 10 at best and 8 does not appear to be influenced by time of day. Aggravating factors include bending kneeling lifting sitting standing squatting and alleviating factors include stretching cold application and medication management. He has associated spasms pain that wakes him up at night fatigue and dizziness and the pain description includes a quality of aching and increasing duration pain. He's had multiple studies to evaluate the nature of his pain and its found to be mainly consistent with osteoarthritis and degenerative joint disease.   History Nicholas [name] has a past medical history of Anterior urethral stricture; Anxiety; Arthritis; Bulging  lumbar disc; BXO (balanitis xerotica obliterans); Complete heart block ([place]); Depression; Gross hematuria; Hyperlipemia; Hypertension; Phimosis; and Pulmonary embolism ([place]).   He has a past surgical history that includes Total hip arthroplasty (Right, 2004); Total hip arthroplasty (Left, 2006); Knee cartilage surgery (Left, 1965); Cataract extraction w/ intraocular lens  implant, bilateral (Bilateral, ~ 2010); Pilonidal cyst excision (1970's); Cardiac catheterization (~ 2005); Left Total Knee Arthroplasty; Hammer toe surgery (Bilateral, 01/06/2015); Bunionectomy (Bilateral, 01/06/2015); Esophagogastroduodenoscopy (egd) with propofol (N/A, 12/07/2015); Colonoscopy with propofol (N/A, 12/07/2015); and Cardiac catheterization (N/A, 02/16/2016).   His family history includes Alcoholism in his mother; Cancer in his mother and sister; Diabetes in his father; Hypertension in his father; Prostate cancer in his paternal grandfather; Stroke in his father.He reports that he has never smoked. He has never used smokeless tobacco. He reports that he drinks alcohol. He reports that he does not use drugs.  No results found for this or any previous visit.  ToxAssure Select 13  Date Value Ref Range Status  01/20/2016 FINAL  Final    Comment:    ==================================================================== TOXASSURE SELECT 13 (MW) ==================================================================== Test                             Result       Flag       Units Drug Present and Declared for Prescription Verification   Lorazepam                      490  EXPECTED   ng/mg creat    Source of lorazepam is a scheduled prescription medication.   Hydrocodone                    1757         EXPECTED   ng/mg creat   Hydromorphone                  150          EXPECTED   ng/mg creat   Dihydrocodeine                 161          EXPECTED   ng/mg creat   Norhydrocodone                 1630         EXPECTED    ng/mg creat    Sources of hydrocodone include scheduled prescription    medications. Hydromorphone, dihydrocodeine and norhydrocodone are    expected metabolites of hydrocodone. Hydromorphone and    dihydrocodeine are also available as scheduled prescription    medications. ==================================================================== Test                      Result    Flag   Units      Ref Range   Creatinine              279              mg/dL      >=20 ==================================================================== Declared Medications:  The flagging and interpretation on this report are based on the  following declared medications.  Unexpected results may arise from  inaccuracies in the declared medications.  **Note: The testing scope of this panel includes these medications:  Hydrocodone (Norco)  Lorazepam (Ativan)  **Note: The testing scope of this panel does not include following  reported medications:  Acetaminophen (Norco)  Amoxicillin  Apixaban (Eliquis)  Cholecalciferol  Cyanocobalamin  Furosemide (Lasix)  Gabapentin (Neurontin)  Hyoscyamine  Iron (Ferrous Sulfate)  Multivitamin  Omega-3 Fatty Acids (Fish Oil)  Oxymetazoline (Afrin)  Pantoprazole (Protonix)  Quetiapine (Seroquel)  Simvastatin  Solifenacin (Vesicare)  Supplement  Venlafaxine (Effexor)  Vitamin C ==================================================================== For clinical consultation, please call [phone]. ====================================================================       ---------------------------------------------------------------------------------------------------------------------- Past Medical History:  Diagnosis Date  . Anterior urethral stricture   . Anxiety   . Arthritis    a. knees, hips, hands;  b. 11/2013 s/p L TKA @ [place].  . Bulging lumbar disc   . BXO (balanitis xerotica obliterans)   . Complete heart block (HCC)    a. s/p MDT dual  chamber (His bundle) pacemaker 01/2016 Dr Caryl Comes  . Depression   . Gross hematuria   . Hyperlipemia   . Hypertension    borderline  . Phimosis   . Pulmonary embolism [facility])     Past Surgical History:  Procedure Laterality Date  . BUNIONECTOMY Bilateral 01/06/2015   Procedure: BUNIONECTOMY;  Surgeon: [name], MD;  Location: [facility] ORS;  Service: Orthopedics;  Laterality: Bilateral;  . CARDIAC CATHETERIZATION  ~ 2005   "once"  . CATARACT EXTRACTION W/ INTRAOCULAR LENS  IMPLANT, BILATERAL Bilateral ~ 2010  . COLONOSCOPY WITH PROPOFOL N/A 12/07/2015   Procedure: COLONOSCOPY WITH PROPOFOL;  Surgeon: Lollie Sails, MD;  Location: [facility] ENDOSCOPY;  Service: Endoscopy;  Laterality: N/A;  . EP IMPLANTABLE DEVICE N/A 02/16/2016   MDT dual chamber (  His Bundle) pacemaker implanted by Dr Caryl Comes for intermittent complete heart block  . ESOPHAGOGASTRODUODENOSCOPY (EGD) WITH PROPOFOL N/A 12/07/2015   Procedure: ESOPHAGOGASTRODUODENOSCOPY (EGD) WITH PROPOFOL;  Surgeon: Lollie Sails, MD;  Location: [facility] ENDOSCOPY;  Service: Endoscopy;  Laterality: N/A;  . HAMMER TOE SURGERY Bilateral 01/06/2015   Procedure: HAMMER TOE CORRECTION;  Surgeon: [name], MD;  Location: [facility] ORS;  Service: Orthopedics;  Laterality: Bilateral;  . KNEE CARTILAGE SURGERY Left 1965   "football injury"  . Left Total Knee Arthroplasty     a. 11/2013 [facility].  Marland Kitchen PILONIDAL CYST EXCISION  1970's  . TOTAL HIP ARTHROPLASTY Right 2004  . TOTAL HIP ARTHROPLASTY Left 2006    Family History  Problem Relation Age of Onset  . Stroke Father        deceased  . Diabetes Father   . Hypertension Father   . Alcoholism Mother        died in her 30's.  . Cancer Mother   . Cancer Sister   . Prostate cancer Paternal Grandfather     Social History  Substance Use Topics  . Smoking status: Never Smoker  . Smokeless tobacco: Never Used  . Alcohol use 0.0 oz/week     Comment: 2 DRINKS/MONTH     ---------------------------------------------------------------------------------------------------------------------- Social History   Social History  . Marital status: Married    Spouse name: N/A  . Number of children: N/A  . Years of education: N/A   Social History Main Topics  . Smoking status: Never Smoker  . Smokeless tobacco: Never Used  . Alcohol use 0.0 oz/week     Comment: 2 DRINKS/MONTH  . Drug use: No  . Sexual activity: Not Currently   Other Topics Concern  . None   Social History Narrative   Lives in [place] with wife.  Does not routinely exercise.  Activity severely limited by bilateral knee pain.      ----------------------------------------------------------------------------------------------------------------------  ROS Review of Systems  No changes are mentioned  GI: No constipation Objective:  BP (!) 150/85   Pulse 71   Temp 98.4 F (36.9 C) (Oral)   Ht 5\' 11"  (1.803 m)   Wt 284 lb (128.8 kg)   SpO2 97%   BMI 39.61 kg/m   Physical Exam  No change on physical exam is noted. Heart is regular rate and rhythm Lungs are clear to also dictation Strength in the lower extremities is without change and his exam appears stable  Previous MRI has revealed diffuse facet and degenerative disc disease throughout the lumbar spine.     Assessment & Plan:   Nicholas [name] was seen today for hand pain.  Diagnoses and all orders for this visit:  Chronic pain syndrome -     ToxASSURE Select 13 (MW), Urine  Facet arthritis of lumbosacral region (HCC)  Chronic, continuous use of opioids  Chronic hand pain, unspecified laterality  Other orders -     HYDROcodone-acetaminophen (NORCO/VICODIN) 5-325 MG tablet; Take 1 tablet by mouth 2 (two) times daily at 10 AM and 5 PM.     ----------------------------------------------------------------------------------------------------------------------  Problem List Items Addressed This Visit    None    Visit  Diagnoses    Chronic pain syndrome    -  Primary   Relevant Orders   ToxASSURE Select 13 (MW), Urine   Facet arthritis of lumbosacral region [facility])       Relevant Medications   HYDROcodone-acetaminophen (NORCO/VICODIN) 5-325 MG tablet   Chronic, continuous use of opioids  Chronic hand pain, unspecified laterality       Relevant Medications   HYDROcodone-acetaminophen (NORCO/VICODIN) 5-325 MG tablet      ----------------------------------------------------------------------------------------------------------------------  1. DDD (degenerative disc disease), lumbar  -2. Facet arthritis of lumbosacral region..Once again I have encouraged him to continue with back stretching strengthening exercises and core strengthening. We will refill his medications today with return to clinic in 2 months. 3. Primary osteoarthritis of both knees 4. Osteoarthritis of right hip, unspecified osteoarthritis type  -5. Primary osteoarthritis of left hip He has a refill for 613 and we will give him a refill for July 13 as well with return to clinic in 2 months for reevaluation. We have reviewed the [facility] practitioner database and his usage is appropriate. -  ----------------------------------------------------------------------------------------------------------------------  I have discontinued Mr. Keeling's furosemide and VESICARE. I am also having him maintain his oxymetazoline, B-12, ferrous sulfate, hyoscyamine, Fish Oil, acetaminophen, ascorbic acid, Cranberry, MULTI-VITAMINS, LORazepam, gabapentin, QUEtiapine Fumarate, simvastatin, solifenacin, venlafaxine XR, ELIQUIS, and HYDROcodone-acetaminophen.   Meds ordered this encounter  Medications  . HYDROcodone-acetaminophen (NORCO/VICODIN) 5-325 MG tablet    Sig: Take 1 tablet by mouth 2 (two) times daily at 10 AM and 5 PM.    Dispense:  60 tablet    Refill:  0    Do not fill until [id number]       Follow-up: Return in about 2 months  (around 08/29/2016) for evaluation, med refill.    [name], MD  This dictation was performed utilizing Dragon voice recognition software.  Please excuse any unintentional or mistaken typographical errors as a result of its unedited utilization.

## 2016-07-04 LAB — TOXASSURE SELECT 13 (MW), URINE

## 2016-08-10 ENCOUNTER — Telehealth: Payer: Self-pay | Admitting: *Deleted

## 2016-08-14 ENCOUNTER — Telehealth: Payer: Self-pay

## 2016-08-14 NOTE — Telephone Encounter (Signed)
Called to [facility] to see why this has not been addressed and whether there is something I am not aware of on the July 31 that would not allow for additional patients.  Nicholas [name] states she will speak to Rozetta Nunnery to see if we can get this patient in to satisfy his MR date.

## 2016-08-14 NOTE — Telephone Encounter (Signed)
Spoke with Nicholas [name] about pt appointment. Pt is now scheduled. I spoke with Rozetta Nunnery to see what the issue was as to why the patient was scheduled for a later date. Dr Andree Elk added 08/22/2016 on to his schedule later on this month.

## 2016-08-15 ENCOUNTER — Ambulatory Visit (INDEPENDENT_AMBULATORY_CARE_PROVIDER_SITE_OTHER): Payer: Medicare Other | Admitting: Family Medicine

## 2016-08-15 ENCOUNTER — Encounter: Payer: Self-pay | Admitting: Family Medicine

## 2016-08-15 VITALS — BP 138/80 | HR 77 | Wt 284.0 lb

## 2016-08-15 DIAGNOSIS — E785 Hyperlipidemia, unspecified: Secondary | ICD-10-CM

## 2016-08-15 DIAGNOSIS — I10 Essential (primary) hypertension: Secondary | ICD-10-CM

## 2016-08-15 DIAGNOSIS — F3342 Major depressive disorder, recurrent, in full remission: Secondary | ICD-10-CM | POA: Diagnosis not present

## 2016-08-15 DIAGNOSIS — F419 Anxiety disorder, unspecified: Secondary | ICD-10-CM

## 2016-08-15 IMAGING — CR DG KNEE 1-2V*L*
1 series · 2 of 2 positions shown · non-contrast
Comparison: 10/29/2012.

CLINICAL DATA: Postop left total knee arthroplasty.

EXAM:
LEFT KNEE - 1-2 VIEW

[Series 1: ap · 0.17mm/px · 2 of 2 slices shown]
[im 1/2]
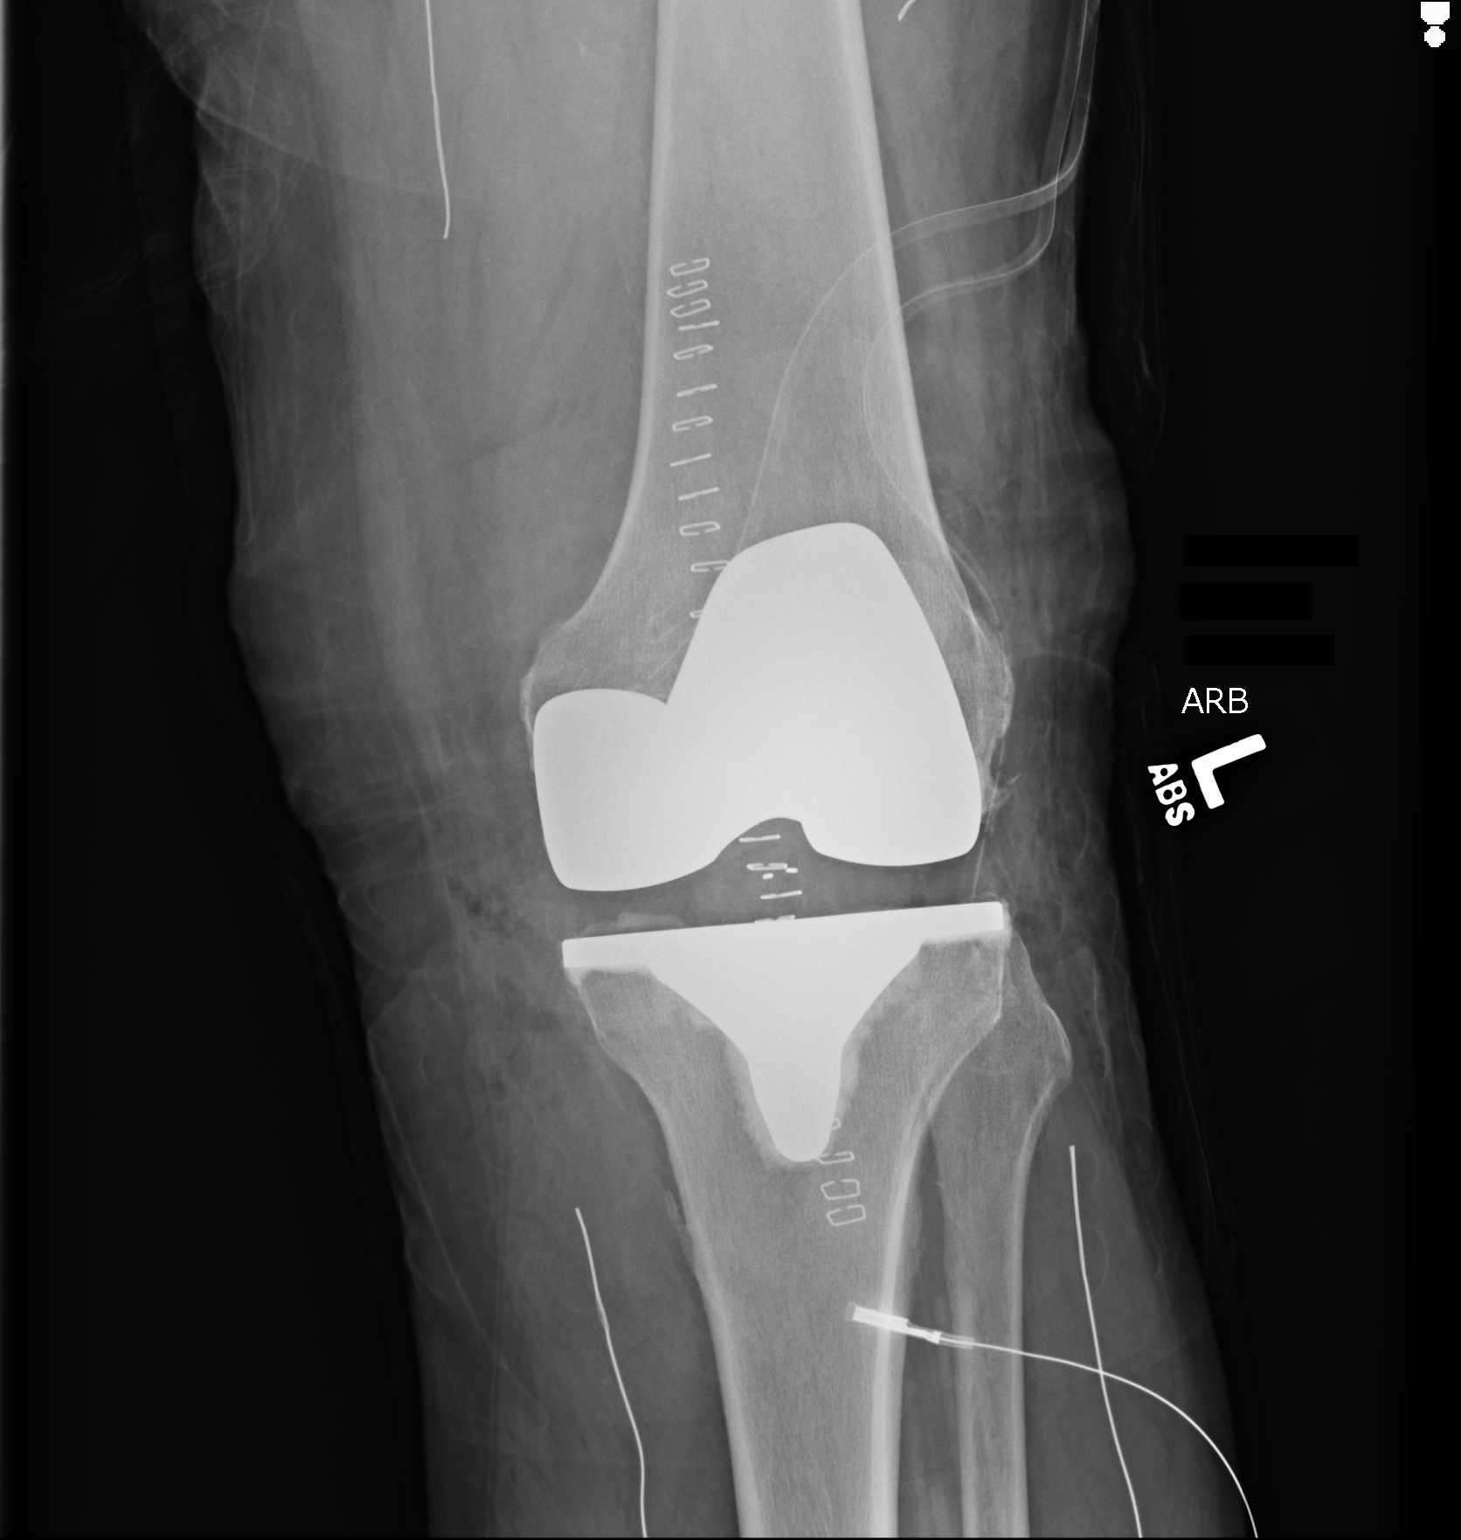
[im 2/2]
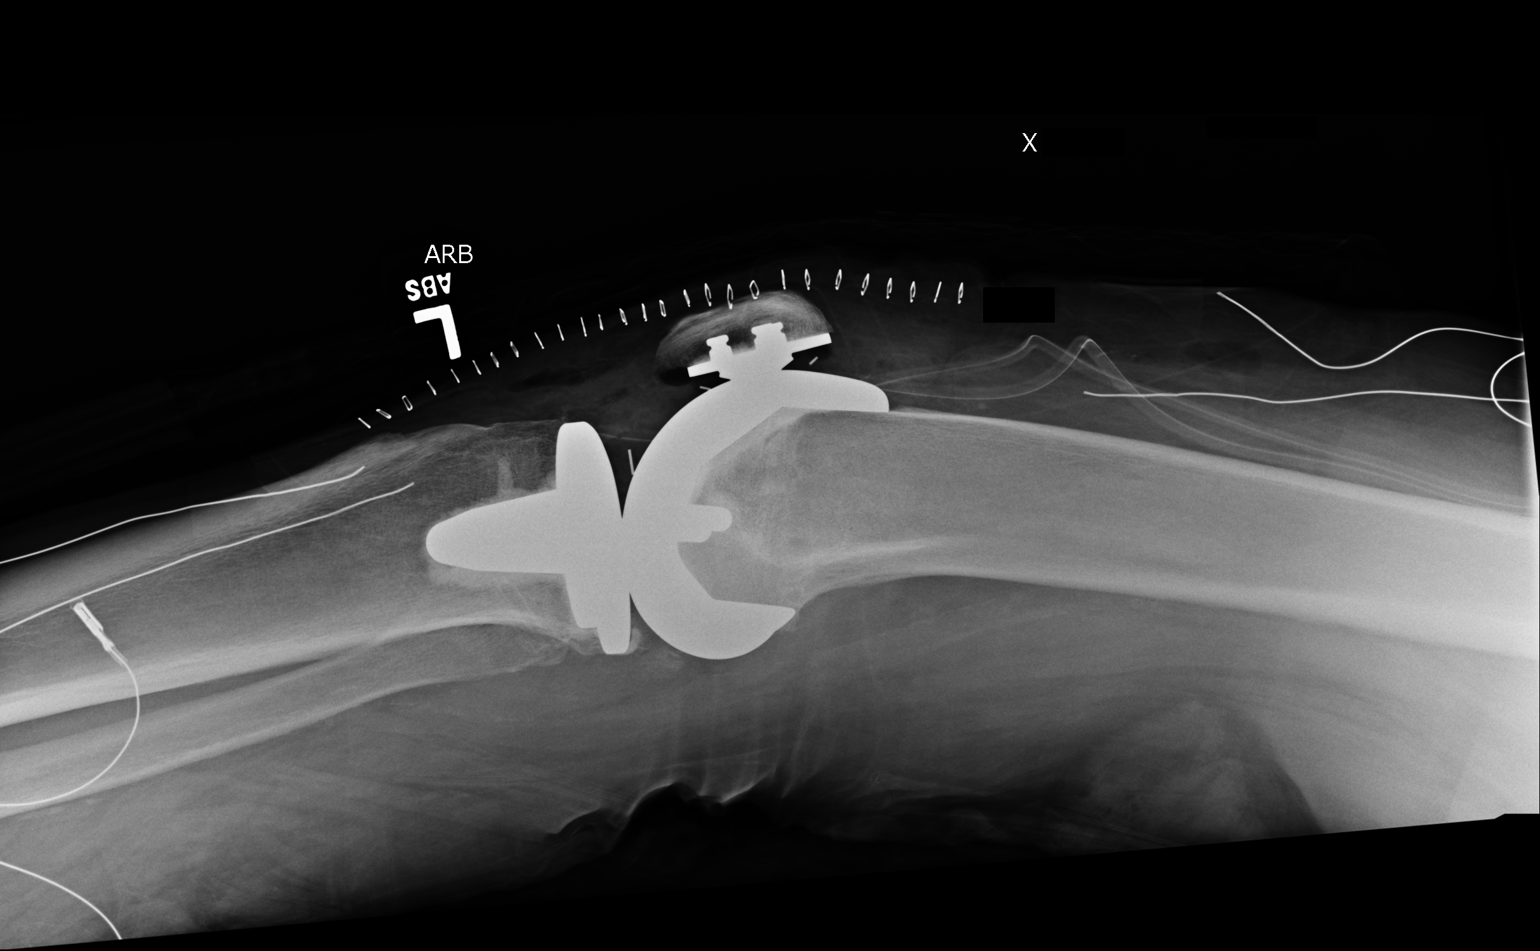

[2 of 2 positions shown; findings below may reference images not displayed]

FINDINGS: The 3 components are well seated.  No complicating features.
IMPRESSION: Well seated components of a total knee arthroplasty.

## 2016-08-15 MED ORDER — LORAZEPAM 1 MG PO TABS: 1.0000 mg | ORAL_TABLET | Freq: Every day | ORAL | 1 refills | Status: DC

## 2016-08-15 NOTE — Assessment & Plan Note (Signed)
The current medical regimen is effective;  continue present plan and medications.  

## 2016-08-15 NOTE — Progress Notes (Signed)
   BP 138/80 (BP Location: Left Arm)   Pulse 77   Wt 284 lb (128.8 kg)   SpO2 96%   BMI 39.61 kg/m    Subjective:    Patient ID: Nicholas [name], male    DOB: 18-Jan-1946, 71 y.o.   MRN: [id number]  HPI: [name] is a 71 y.o. male  Chief Complaint  Patient presents with  . Follow-up  . Hyperlipidemia  . Hypertension  Patient all in all doing very well taking medications without problems. Good control of blood pressure cholesterol. Anxiety nerves depression doing well. No issues with lorazepam and Vicodin   Relevant past medical, surgical, family and social history reviewed and updated as indicated. Interim medical history since our last visit reviewed. Allergies and medications reviewed and updated.  Review of Systems  Constitutional: Negative.   Respiratory: Negative.   Cardiovascular: Negative.     Per HPI unless specifically indicated above     Objective:    BP 138/80 (BP Location: Left Arm)   Pulse 77   Wt 284 lb (128.8 kg)   SpO2 96%   BMI 39.61 kg/m   Wt Readings from Last 3 Encounters:  08/15/16 284 lb (128.8 kg)  06/29/16 284 lb (128.8 kg)  05/23/16 284 lb 4 oz (128.9 kg)    Physical Exam  Constitutional: He is oriented to person, place, and time. He appears well-developed and well-nourished.  HENT:  Head: Normocephalic and atraumatic.  Eyes: Conjunctivae and EOM are normal.  Neck: Normal range of motion.  Cardiovascular: Normal rate, regular rhythm and normal heart sounds.   Pulmonary/Chest: Effort normal and breath sounds normal.  Musculoskeletal: Normal range of motion.  Neurological: He is alert and oriented to person, place, and time.  Skin: No erythema.  Psychiatric: He has a normal mood and affect. His behavior is normal. Judgment and thought content normal.    Results for orders placed or performed in visit on 06/29/16  ToxASSURE Select 13 (MW), Urine  Result Value Ref Range   Summary FINAL       Assessment & Plan:   Problem List  Items Addressed This Visit      Cardiovascular and Mediastinum   HTN (hypertension) - Primary (Chronic)    The current medical regimen is effective;  continue present plan and medications.       Relevant Orders   Basic metabolic panel   LP+ALT+AST Piccolo, Waived     Other   Dyslipidemia (Chronic)    The current medical regimen is effective;  continue present plan and medications.       Relevant Orders   Basic metabolic panel   LP+ALT+AST Piccolo, Waived   Depression    The current medical regimen is effective;  continue present plan and medications.       Relevant Medications   LORazepam (ATIVAN) 1 MG tablet   Anxiety    The current medical regimen is effective;  continue present plan and medications.       Relevant Medications   LORazepam (ATIVAN) 1 MG tablet     Patient also complaining of osteoarthritis of both hands. Reviewed will try Zostrix. If not helpful will consist consider rheumatology referral.  Follow up plan: Return for Physical Exam, April.

## 2016-08-16 ENCOUNTER — Encounter: Payer: Self-pay | Admitting: Family Medicine

## 2016-08-16 LAB — BASIC METABOLIC PANEL
BUN / CREAT RATIO: 19 (ref 10–24)
BUN: 20 mg/dL (ref 8–27)
CO2: 24 mmol/L (ref 20–29)
CREATININE: 1.05 mg/dL (ref 0.76–1.27)
Calcium: 9.1 mg/dL (ref 8.6–10.2)
Chloride: 104 mmol/L (ref 96–106)
GFR calc Af Amer: 82 mL/min/{1.73_m2} (ref >59)
GFR calc non Af Amer: 71 mL/min/{1.73_m2} (ref >59)
GLUCOSE: 141 mg/dL — AB (ref 65–99)
Potassium: 5 mmol/L (ref 3.5–5.2)
Sodium: 142 mmol/L (ref 134–144)

## 2016-08-17 LAB — LP+ALT+AST PICCOLO, WAIVED
ALT (SGPT) Piccolo, Waived: 25 U/L (ref 10–47)
AST (SGOT) Piccolo, Waived: 39 U/L — ABNORMAL HIGH (ref 11–38)
CHOL/HDL RATIO PICCOLO,WAIVE: 3.3 mg/dL
CHOLESTEROL PICCOLO, WAIVED: 181 mg/dL (ref <200)
HDL CHOL PICCOLO, WAIVED: 54 mg/dL — AB (ref >59)
LDL CHOL CALC PICCOLO WAIVED: 106 mg/dL — AB (ref <100)
Triglycerides Piccolo,Waived: 107 mg/dL (ref <150)
VLDL CHOL CALC PICCOLO,WAIVE: 21 mg/dL (ref <30)

## 2016-08-22 ENCOUNTER — Ambulatory Visit (INDEPENDENT_AMBULATORY_CARE_PROVIDER_SITE_OTHER): Payer: Medicare Other | Admitting: *Deleted

## 2016-08-22 ENCOUNTER — Ambulatory Visit: Payer: Medicare Other | Attending: Anesthesiology | Admitting: Anesthesiology

## 2016-08-22 ENCOUNTER — Encounter: Payer: Self-pay | Admitting: Anesthesiology

## 2016-08-22 VITALS — BP 156/91 | HR 77 | Temp 99.3°F | Resp 18 | Ht 71.0 in | Wt 284.0 lb

## 2016-08-22 DIAGNOSIS — M4686 Other specified inflammatory spondylopathies, lumbar region: Secondary | ICD-10-CM | POA: Insufficient documentation

## 2016-08-22 DIAGNOSIS — M79643 Pain in unspecified hand: Secondary | ICD-10-CM | POA: Insufficient documentation

## 2016-08-22 DIAGNOSIS — Z79891 Long term (current) use of opiate analgesic: Secondary | ICD-10-CM | POA: Diagnosis not present

## 2016-08-22 DIAGNOSIS — M4697 Unspecified inflammatory spondylopathy, lumbosacral region: Secondary | ICD-10-CM

## 2016-08-22 DIAGNOSIS — F1190 Opioid use, unspecified, uncomplicated: Secondary | ICD-10-CM

## 2016-08-22 DIAGNOSIS — M545 Low back pain: Secondary | ICD-10-CM | POA: Insufficient documentation

## 2016-08-22 DIAGNOSIS — G8929 Other chronic pain: Secondary | ICD-10-CM

## 2016-08-22 DIAGNOSIS — M47817 Spondylosis without myelopathy or radiculopathy, lumbosacral region: Secondary | ICD-10-CM

## 2016-08-22 DIAGNOSIS — Z79899 Other long term (current) drug therapy: Secondary | ICD-10-CM | POA: Insufficient documentation

## 2016-08-22 DIAGNOSIS — M170 Bilateral primary osteoarthritis of knee: Secondary | ICD-10-CM | POA: Diagnosis not present

## 2016-08-22 DIAGNOSIS — I442 Atrioventricular block, complete: Secondary | ICD-10-CM

## 2016-08-22 DIAGNOSIS — G894 Chronic pain syndrome: Secondary | ICD-10-CM | POA: Diagnosis not present

## 2016-08-22 DIAGNOSIS — M1611 Unilateral primary osteoarthritis, right hip: Secondary | ICD-10-CM | POA: Diagnosis not present

## 2016-08-22 MED ORDER — HYDROCODONE-ACETAMINOPHEN 5-325 MG PO TABS: 1.0000 | ORAL_TABLET | Freq: Two times a day (BID) | ORAL | 0 refills | Status: DC

## 2016-08-22 NOTE — Progress Notes (Signed)
Nursing Pain Medication Assessment:  Safety precautions to be maintained throughout the outpatient stay will include: orient to surroundings, keep bed in low position, maintain call bell within reach at all times, provide assistance with transfer out of bed and ambulation.  Medication Inspection Compliance: Pill count conducted under aseptic conditions, in front of the patient. Neither the pills nor the bottle was removed from the patient's sight at any time. Once count was completed pills were immediately returned to the patient in their original bottle.  Medication: Hydrocodone/APAP Pill/Patch Count: 29 of 60 pills remain Pill/Patch Appearance: Markings consistent with prescribed medication Bottle Appearance: Standard pharmacy container. Clearly labeled. Filled Date: 07 / 14 / 2018 Last Medication intake:  Today

## 2016-08-22 NOTE — Progress Notes (Signed)
Remote pacemaker transmission.   

## 2016-08-23 NOTE — Progress Notes (Signed)
Subjective:  Patient ID: Nicholas [name], male    DOB: [birth year]  Age: 71 y.o. MRN: [id number]  CC: Hand Pain (bilateral)   HPI Nicholas [name] presents for Low back pain bilateral hip pain and knee pain and lower leg pain. This is been his baseline pain complex. I have reviewed his narcotic assessment sheet and he continues to derive good pain control with his medications. Overall he is been well tolerated with minimal side effects and improvement in his overall lifestyle function. The pain he describes mainly an aching gnawing burning pain that seems to be worse with standing affecting low back knees hips and legs. No change of bowel bladder function or lower extremity strength or function are noted at this time  Outpatient Medications Prior to Visit  Medication Sig Dispense Refill  . acetaminophen (TYLENOL) 500 MG tablet Take 2,000 mg by mouth daily as needed for moderate pain or headache.    Marland Kitchen ascorbic acid (VITAMIN C) 250 MG tablet Take by mouth.    . Cranberry 500 MG CAPS Take by mouth.    . Cyanocobalamin (B-12) 1000 MCG CAPS Take 1,000 mcg by mouth daily.     Marland Kitchen ELIQUIS 5 MG TABS tablet TAKE 1 TABLET (5 MG TOTAL) BY MOUTH 2 (TWO) TIMES DAILY. 180 tablet 1  . ferrous sulfate 325 (65 FE) MG tablet Take 325 mg by mouth daily with breakfast.    . gabapentin (NEURONTIN) 400 MG capsule Take 1 capsule (400 mg total) by mouth 3 (three) times daily. 270 capsule 4  . hyoscyamine (LEVSIN, ANASPAZ) 0.125 MG tablet Take 0.125 mg by mouth every 6 (six) hours as needed for bladder spasms or cramping.     Marland Kitchen LORazepam (ATIVAN) 1 MG tablet Take 1 tablet (1 mg total) by mouth at bedtime. 90 tablet 1  . Multiple Vitamin (MULTI-VITAMINS) TABS Take by mouth.    . Omega-3 Fatty Acids (FISH OIL) 1200 MG CAPS Take 1,200 mg by mouth daily.    Marland Kitchen oxymetazoline (AFRIN) 0.05 % nasal spray Place 2 sprays into both nostrils at bedtime as needed for congestion.    . QUEtiapine Fumarate (SEROQUEL XR) 150 MG 24 hr tablet  Take 1 tablet (150 mg total) by mouth at bedtime. 90 tablet 4  . simvastatin (ZOCOR) 20 MG tablet Take 1 tablet (20 mg total) by mouth daily. 90 tablet 4  . solifenacin (VESICARE) 10 MG tablet Take 1 tablet (10 mg total) by mouth daily. 90 tablet 4  . venlafaxine XR (EFFEXOR XR) 75 MG 24 hr capsule Take 3 capsules (225 mg total) by mouth daily. 270 capsule 4  . HYDROcodone-acetaminophen (NORCO/VICODIN) 5-325 MG tablet Take 1 tablet by mouth 2 (two) times daily at 10 AM and 5 PM. 60 tablet 0   No facility-administered medications prior to visit.    ROS Review of Systems  Cardiac: No angina or chest pain Pulmonary: No shortness of breath GI: No constipation  Objective:  BP (!) 156/91   Pulse 77   Temp 99.3 F (37.4 C) (Oral)   Resp 18   Ht 5\' 11"  (1.803 m)   Wt 284 lb (128.8 kg)   SpO2 98%   BMI 39.61 kg/m    BP Readings from Last 3 Encounters:  08/22/16 (!) 156/91  08/15/16 138/80  06/29/16 (!) 150/85     Wt Readings from Last 3 Encounters:  08/22/16 284 lb (128.8 kg)  08/15/16 284 lb (128.8 kg)  06/29/16 284 lb (128.8 kg)  Physical Exam  PERRL EOMI HEART IS RRR  LCTA MUSCULOSKELETALShe has some low back tenderness in the lumbar region with bilateral paraspinous tenderness but no overt trigger points. His strength is at baseline as is his muscle bulk and tone  Labs  No results found for: HGBA1C Lab Results  Component Value Date   LDLCALC 94 03/23/2016   CREATININE 1.05 08/15/2016    -------------------------------------------------------------------------------------------------------------------- Lab Results  Component Value Date   WBC 5.2 03/23/2016   HGB 15.7 03/23/2016   HCT 46.3 03/23/2016   PLT 166 03/23/2016   GLUCOSE 141 (H) 08/15/2016   CHOL 181 08/15/2016   TRIG 107 08/15/2016   HDL 44 03/23/2016   LDLCALC 94 03/23/2016   ALT 25 08/15/2016   AST 39 (H) 08/15/2016   NA 142 08/15/2016   K 5.0 08/15/2016   CL 104 08/15/2016    CREATININE 1.05 08/15/2016   BUN 20 08/15/2016   CO2 24 08/15/2016   TSH 2.760 03/23/2016   INR 1.2 02/16/2014    --------------------------------------------------------------------------------------------------------------------- Dg Chest 2 View  Result Date: 02/17/2016 CLINICAL DATA:  Pacemaker. EXAM: CHEST  2 VIEW COMPARISON:  11/19/2015 . 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 Cardiomegaly with normal pulmonary vascularity. COPD . Low lung volumes with basilar atelectasis. No pleural effusion or pneumothorax IMPRESSION: 1. 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 No pneumothorax. Stable cardiomegaly. No pulmonary venous congestion. 2. Low lung volumes with bibasilar atelectasis .  COPD . Electronically Signed   By: Nicholas [name]  Register   On: 02/17/2016 07:45     Assessment & Plan:   Nicholas [name] was seen today for hand pain.  Diagnoses and all orders for this visit:  Chronic pain syndrome  Facet arthritis of lumbosacral region ([place])  Chronic, continuous use of opioids  Osteoarthritis of right hip, unspecified osteoarthritis type  Chronic bilateral low back pain without sciatica  Primary osteoarthritis of both knees  Chronic hand pain, unspecified laterality  Other orders -     Discontinue: HYDROcodone-acetaminophen (NORCO/VICODIN) 5-325 MG tablet; Take 1 tablet by mouth 2 (two) times daily at 10 AM and 5 PM. -     HYDROcodone-acetaminophen (NORCO/VICODIN) 5-325 MG tablet; Take 1 tablet by mouth 2 (two) times daily at 10 AM and 5 PM.        ----------------------------------------------------------------------------------------------------------------------  Problem List Items Addressed This Visit    None    Visit Diagnoses    Chronic pain syndrome    -  Primary   Facet arthritis of lumbosacral region [facility])       Relevant Medications   HYDROcodone-acetaminophen (NORCO/VICODIN) 5-325 MG tablet   Chronic, continuous use of  opioids       Osteoarthritis of right hip, unspecified osteoarthritis type       Relevant Medications   HYDROcodone-acetaminophen (NORCO/VICODIN) 5-325 MG tablet   Chronic bilateral low back pain without sciatica       Relevant Medications   HYDROcodone-acetaminophen (NORCO/VICODIN) 5-325 MG tablet   Primary osteoarthritis of both knees       Relevant Medications   HYDROcodone-acetaminophen (NORCO/VICODIN) 5-325 MG tablet   Chronic hand pain, unspecified laterality       Relevant Medications   HYDROcodone-acetaminophen (NORCO/VICODIN) 5-325 MG tablet        ----------------------------------------------------------------------------------------------------------------------  1. Chronic pain syndrome We will refill his medications for the next 2 months for August 12 and September 11. We have also reviewed his most recent urine drug screen and it was appropriate  on June 18. I've also reviewed the information from the [facility] and it is appropriate for his opioid management.  2. Facet arthritis of lumbosacral region ([place])   3. Chronic, continuous use of opioids   4. Osteoarthritis of right hip, unspecified osteoarthritis type   5. Chronic bilateral low back pain without sciatica   6. Primary osteoarthritis of both knees   7. Chronic hand pain, unspecified laterality     ----------------------------------------------------------------------------------------------------------------------  I am having Mr. Knotts maintain his oxymetazoline, B-12, ferrous sulfate, hyoscyamine, Fish Oil, acetaminophen, ascorbic acid, Cranberry, MULTI-VITAMINS, gabapentin, QUEtiapine Fumarate, simvastatin, solifenacin, venlafaxine XR, ELIQUIS, LORazepam, and HYDROcodone-acetaminophen.   Meds ordered this encounter  Medications  . DISCONTD: HYDROcodone-acetaminophen (NORCO/VICODIN) 5-325 MG tablet    Sig: Take 1 tablet by mouth 2 (two) times daily at 10 AM and 5  PM.    Dispense:  60 tablet    Refill:  0    Do not fill until [id number]  . HYDROcodone-acetaminophen (NORCO/VICODIN) 5-325 MG tablet    Sig: Take 1 tablet by mouth 2 (two) times daily at 10 AM and 5 PM.    Dispense:  60 tablet    Refill:  0    Do not fill until [id number]   Patient's Medications  New Prescriptions   No medications on file  Previous Medications   ACETAMINOPHEN (TYLENOL) 500 MG TABLET    Take 2,000 mg by mouth daily as needed for moderate pain or headache.   ASCORBIC ACID (VITAMIN C) 250 MG TABLET    Take by mouth.   CRANBERRY 500 MG CAPS    Take by mouth.   CYANOCOBALAMIN (B-12) 1000 MCG CAPS    Take 1,000 mcg by mouth daily.    ELIQUIS 5 MG TABS TABLET    TAKE 1 TABLET (5 MG TOTAL) BY MOUTH 2 (TWO) TIMES DAILY.   FERROUS SULFATE 325 (65 FE) MG TABLET    Take 325 mg by mouth daily with breakfast.   GABAPENTIN (NEURONTIN) 400 MG CAPSULE    Take 1 capsule (400 mg total) by mouth 3 (three) times daily.   HYOSCYAMINE (LEVSIN, ANASPAZ) 0.125 MG TABLET    Take 0.125 mg by mouth every 6 (six) hours as needed for bladder spasms or cramping.    LORAZEPAM (ATIVAN) 1 MG TABLET    Take 1 tablet (1 mg total) by mouth at bedtime.   MULTIPLE VITAMIN (MULTI-VITAMINS) TABS    Take by mouth.   OMEGA-3 FATTY ACIDS (FISH OIL) 1200 MG CAPS    Take 1,200 mg by mouth daily.   OXYMETAZOLINE (AFRIN) 0.05 % NASAL SPRAY    Place 2 sprays into both nostrils at bedtime as needed for congestion.   QUETIAPINE FUMARATE (SEROQUEL XR) 150 MG 24 HR TABLET    Take 1 tablet (150 mg total) by mouth at bedtime.   SIMVASTATIN (ZOCOR) 20 MG TABLET    Take 1 tablet (20 mg total) by mouth daily.   SOLIFENACIN (VESICARE) 10 MG TABLET    Take 1 tablet (10 mg total) by mouth daily.   VENLAFAXINE XR (EFFEXOR XR) 75 MG 24 HR CAPSULE    Take 3 capsules (225 mg total) by mouth daily.  Modified Medications   Modified Medication Previous Medication   HYDROCODONE-ACETAMINOPHEN (NORCO/VICODIN) 5-325 MG TABLET  HYDROcodone-acetaminophen (NORCO/VICODIN) 5-325 MG tablet      Take 1 tablet by mouth 2 (two) times daily at 10 AM and 5 PM.    Take 1 tablet by mouth 2 (two)  times daily at 10 AM and 5 PM.  Discontinued Medications   No medications on file   ----------------------------------------------------------------------------------------------------------------------  Follow-up: Return in about 2 months (around 10/22/2016) for evaluation, med refill.  Greater than 50% of the evaluation time was spent on counseling and coordination of care.   [name], MD

## 2016-08-24 LAB — CUP PACEART REMOTE DEVICE CHECK
Brady Statistic AP VS Percent: 0.04 %
Brady Statistic AS VP Percent: 62.98 %
Brady Statistic RA Percent Paced: 34.89 %
Brady Statistic RV Percent Paced: 99.67 %
Date Time Interrogation Session: 20180731200526
Implantable Lead Location: 753859
Implantable Lead Location: 753860
Implantable Lead Model: 3830
Lead Channel Impedance Value: 532 Ohm
Lead Channel Impedance Value: 532 Ohm
Lead Channel Pacing Threshold Pulse Width: 0.4 ms
Lead Channel Sensing Intrinsic Amplitude: 1.5 mV
Lead Channel Sensing Intrinsic Amplitude: 1.5 mV
Lead Channel Sensing Intrinsic Amplitude: 5 mV
Lead Channel Setting Pacing Amplitude: 2.5 V
Lead Channel Setting Pacing Pulse Width: 1 ms
Lead Channel Setting Sensing Sensitivity: 0.9 mV
MDC IDC LEAD IMPLANT DT: 20180124
MDC IDC LEAD IMPLANT DT: 20180124
MDC IDC MSMT BATTERY REMAINING LONGEVITY: 86 mo
MDC IDC MSMT BATTERY VOLTAGE: 3.01 V
MDC IDC MSMT LEADCHNL RA IMPEDANCE VALUE: 380 Ohm
MDC IDC MSMT LEADCHNL RA PACING THRESHOLD AMPLITUDE: 0.625 V
MDC IDC MSMT LEADCHNL RA SENSING INTR AMPL: 5 mV
MDC IDC MSMT LEADCHNL RV IMPEDANCE VALUE: 323 Ohm
MDC IDC MSMT LEADCHNL RV PACING THRESHOLD AMPLITUDE: 0.75 V
MDC IDC MSMT LEADCHNL RV PACING THRESHOLD PULSEWIDTH: 0.4 ms
MDC IDC PG IMPLANT DT: 20180124
MDC IDC SET LEADCHNL RA PACING AMPLITUDE: 2 V
MDC IDC STAT BRADY AP VP PERCENT: 36.78 %
MDC IDC STAT BRADY AS VS PERCENT: 0.21 %

## 2016-08-25 ENCOUNTER — Encounter: Payer: Self-pay | Admitting: Cardiology

## 2016-09-06 ENCOUNTER — Telehealth: Payer: Self-pay | Admitting: *Deleted

## 2016-09-07 ENCOUNTER — Telehealth: Payer: Self-pay | Admitting: *Deleted

## 2016-09-07 NOTE — Addendum Note (Signed)
Addended by: [name] on: 09/07/2016 02:52 PM   Modules accepted: Orders

## 2016-09-07 NOTE — Telephone Encounter (Signed)
Patient states Dr. Andree Elk has mentioned getting an ESI at past appointments. Will discuss with Dr. Andree Elk today.

## 2016-09-07 NOTE — Telephone Encounter (Signed)
Patient has called again leaving a different # [phone]

## 2016-09-07 NOTE — Telephone Encounter (Signed)
Spoke with Dr. Andree Elk, will order ESI. Blanch Media, please check for PA and schedule. I did not give patient any procedure instructions, but left a message on VM stating we will be notifying him of PA and appointment.

## 2016-09-11 NOTE — Telephone Encounter (Signed)
No prior auth needed for epidural

## 2016-09-12 ENCOUNTER — Encounter: Payer: BLUE CROSS/BLUE SHIELD | Admitting: Anesthesiology

## 2016-09-13 IMAGING — CR DG CHEST 2V
1 series · 1 of 1 positions shown · non-contrast
Comparison: April 07, 2013.

CLINICAL DATA: Swelling and enlargement of the right
sternoclavicular area for 2 months. No known injury.

EXAM:
CHEST  2 VIEW

[lat]
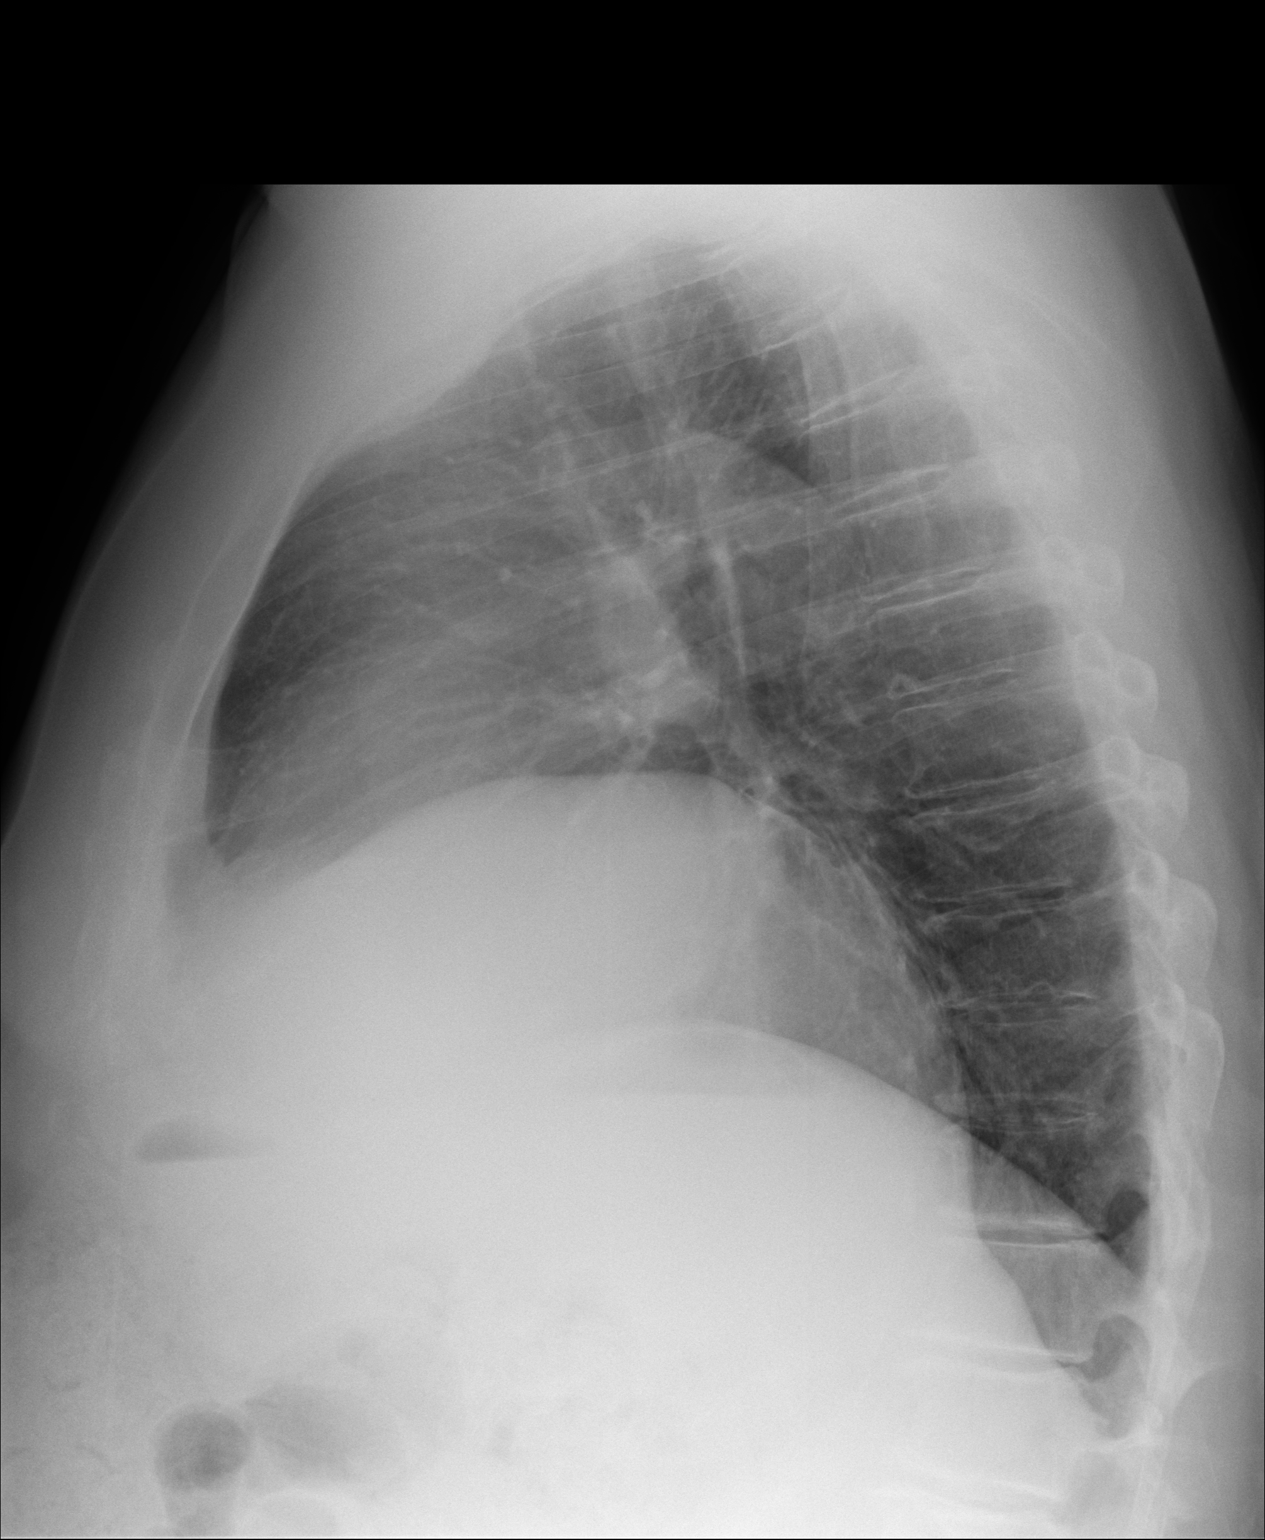

[1 of 1 positions shown; findings below may reference images not displayed]

FINDINGS: The heart size and mediastinal contours are within normal limits.
Both lungs are clear. Stable elevation of right hemidiaphragm is
noted. No pneumothorax or pleural effusion is noted. The visualized
skeletal structures are unremarkable.
IMPRESSION: No acute cardiopulmonary abnormality seen.

## 2016-09-18 ENCOUNTER — Encounter: Payer: Self-pay | Admitting: Cardiology

## 2016-09-19 ENCOUNTER — Encounter: Payer: Self-pay | Admitting: Anesthesiology

## 2016-09-26 ENCOUNTER — Ambulatory Visit: Payer: Medicare Other | Admitting: Anesthesiology

## 2016-09-26 ENCOUNTER — Encounter: Payer: Self-pay | Admitting: Anesthesiology

## 2016-09-26 VITALS — BP 151/85 | HR 82 | Temp 98.3°F | Resp 18 | Ht 71.0 in | Wt 284.0 lb

## 2016-09-27 DIAGNOSIS — D1801 Hemangioma of skin and subcutaneous tissue: Secondary | ICD-10-CM | POA: Diagnosis not present

## 2016-09-27 DIAGNOSIS — L821 Other seborrheic keratosis: Secondary | ICD-10-CM | POA: Diagnosis not present

## 2016-09-27 DIAGNOSIS — D229 Melanocytic nevi, unspecified: Secondary | ICD-10-CM | POA: Diagnosis not present

## 2016-09-27 DIAGNOSIS — L578 Other skin changes due to chronic exposure to nonionizing radiation: Secondary | ICD-10-CM | POA: Diagnosis not present

## 2016-09-28 DIAGNOSIS — M5136 Other intervertebral disc degeneration, lumbar region: Secondary | ICD-10-CM | POA: Diagnosis not present

## 2016-09-29 ENCOUNTER — Ambulatory Visit: Payer: Medicare Other | Admitting: Anesthesiology

## 2016-09-29 ENCOUNTER — Ambulatory Visit (HOSPITAL_BASED_OUTPATIENT_CLINIC_OR_DEPARTMENT_OTHER): Payer: Medicare Other | Admitting: Anesthesiology

## 2016-09-29 ENCOUNTER — Encounter: Payer: Self-pay | Admitting: Anesthesiology

## 2016-09-29 ENCOUNTER — Ambulatory Visit
Admission: RE | Admit: 2016-09-29 | Discharge: 2016-09-29 | Disposition: A | Discharge status: Home / Self Care | Payer: Medicare Other | Source: Ambulatory Visit | Attending: Anesthesiology | Admitting: Anesthesiology

## 2016-09-29 ENCOUNTER — Other Ambulatory Visit: Payer: Self-pay | Admitting: Anesthesiology

## 2016-09-29 VITALS — BP 146/96 | HR 79 | Temp 98.6°F | Resp 15 | Ht 71.0 in | Wt 284.0 lb

## 2016-09-29 DIAGNOSIS — M5431 Sciatica, right side: Secondary | ICD-10-CM | POA: Diagnosis not present

## 2016-09-29 DIAGNOSIS — M47817 Spondylosis without myelopathy or radiculopathy, lumbosacral region: Secondary | ICD-10-CM

## 2016-09-29 DIAGNOSIS — Z79899 Other long term (current) drug therapy: Secondary | ICD-10-CM | POA: Insufficient documentation

## 2016-09-29 DIAGNOSIS — M1611 Unilateral primary osteoarthritis, right hip: Secondary | ICD-10-CM

## 2016-09-29 DIAGNOSIS — M25551 Pain in right hip: Secondary | ICD-10-CM | POA: Insufficient documentation

## 2016-09-29 DIAGNOSIS — R52 Pain, unspecified: Secondary | ICD-10-CM

## 2016-09-29 DIAGNOSIS — M5136 Other intervertebral disc degeneration, lumbar region: Secondary | ICD-10-CM

## 2016-09-29 DIAGNOSIS — M4697 Unspecified inflammatory spondylopathy, lumbosacral region: Secondary | ICD-10-CM

## 2016-09-29 DIAGNOSIS — M545 Low back pain: Secondary | ICD-10-CM | POA: Insufficient documentation

## 2016-09-29 DIAGNOSIS — M5432 Sciatica, left side: Secondary | ICD-10-CM

## 2016-09-29 MED ORDER — ROPIVACAINE HCL 2 MG/ML IJ SOLN
INTRAMUSCULAR | Status: AC
  Filled 2016-09-29: qty 10

## 2016-09-29 MED ORDER — ROPIVACAINE HCL 2 MG/ML IJ SOLN
10.0000 mL | Freq: Once | INTRAMUSCULAR | Status: AC
  Administered 2016-09-29: 10 mL via EPIDURAL

## 2016-09-29 MED ORDER — SODIUM CHLORIDE 0.9% FLUSH
10.0000 mL | Freq: Once | INTRAVENOUS | Status: AC
  Administered 2016-09-29: 10 mL

## 2016-09-29 MED ORDER — LIDOCAINE HCL (PF) 1 % IJ SOLN
5.0000 mL | Freq: Once | INTRAMUSCULAR | Status: AC
  Administered 2016-09-29: 5 mL via SUBCUTANEOUS

## 2016-09-29 MED ORDER — IOPAMIDOL (ISOVUE-M 200) INJECTION 41%
20.0000 mL | Freq: Once | INTRAMUSCULAR | Status: DC | PRN
  Administered 2016-09-29: 10 mL
  Filled 2016-09-29: qty 20

## 2016-09-29 MED ORDER — TRIAMCINOLONE ACETONIDE 40 MG/ML IJ SUSP
INTRAMUSCULAR | Status: AC
  Filled 2016-09-29: qty 1

## 2016-09-29 MED ORDER — LIDOCAINE HCL (PF) 1 % IJ SOLN
INTRAMUSCULAR | Status: AC
  Filled 2016-09-29: qty 5

## 2016-09-29 MED ORDER — HYDROCODONE-ACETAMINOPHEN 5-325 MG PO TABS: 1.0000 | ORAL_TABLET | Freq: Two times a day (BID) | ORAL | 0 refills | Status: DC

## 2016-09-29 MED ORDER — IOPAMIDOL (ISOVUE-M 200) INJECTION 41%
INTRAMUSCULAR | Status: AC
  Filled 2016-09-29: qty 10

## 2016-09-29 MED ORDER — TRIAMCINOLONE ACETONIDE 40 MG/ML IJ SUSP
40.0000 mg | Freq: Once | INTRAMUSCULAR | Status: AC
  Administered 2016-09-29: 40 mg

## 2016-09-29 MED ORDER — SODIUM CHLORIDE 0.9 % IJ SOLN
INTRAMUSCULAR | Status: AC
  Filled 2016-09-29: qty 10

## 2016-09-29 NOTE — Patient Instructions (Signed)
Post-procedure Information What to expect: Most procedures involve the use of a local anesthetic (numbing medicine), and a steroid (anti-inflammatory medicine).  The local anesthetics may cause temporary numbness and weakness of the legs or arms, depending on the location of the block. This numbness/weakness may last 4-6 hours, depending on the local anesthetic used. In rare instances, it can last up to 24 hours. While numb, you must be very careful not to injure the extremity.  After any procedure, you could expect the pain to get better within 15-20 minutes. This relief is temporary and may last 4-6 hours. Once the local anesthetics wears off, you could experience discomfort, possibly more than usual, for up to 10 (ten) days. In the case of radiofrequencies, it may last up to 6 weeks. Surgeries may take up to 8 weeks for the healing process. The discomfort is due to the irritation caused by needles going through skin and muscle. To minimize the discomfort, we recommend using ice the first day, and heat from then on. The ice should be applied for 15 minutes on, and 15 minutes off. Keep repeating this cycle until bedtime. Avoid applying the ice directly to the skin, to prevent frostbite. Heat should be used daily, until the pain improves (4-10 days). Be careful not to burn yourself.  Occasionally you may experience muscle spasms or cramps. These occur as a consequence of the irritation caused by the needle sticks to the muscle and the blood that will inevitably be lost into the surrounding muscle tissue. Blood tends to be very irritating to tissues, which tend to react by going into spasm. These spasms may start the same day of your procedure, but they may also take days to develop. This late onset type of spasm or cramp is usually caused by electrolyte imbalances triggered by the steroids, at the level of the kidney. Cramps and spasms tend to respond well to muscle relaxants, multivitamins (some are  triggered by the procedure, but may have their origins in vitamin deficiencies), and "Gatorade", or any sports drinks that can replenish any electrolyte imbalances. (If you are a diabetic, ask your pharmacist to get you a sugar-free brand.) Warm showers or baths may also be helpful. Stretching exercises are highly recommended. General Instructions:  Be alert for signs of possible infection: redness, swelling, heat, red streaks, elevated temperature, and/or fever. These typically appear 4 to 6 days after the procedure. Immediately notify your doctor if you experience unusual bleeding, difficulty breathing, or loss of bowel or bladder control. If you experience increased pain, do not increase your pain medicine intake, unless instructed by your pain physician. Post-Procedure Care:  Be careful in moving about. Muscle spasms in the area of the injection may occur. Applying ice or heat to the area is often helpful. The incidence of spinal headaches after epidural injections ranges between 1.4% and 6%. If you develop a headache that does not seem to respond to conservative therapy, please let your physician know. This can be treated with an epidural blood patch.   Post-procedure numbness or redness is to be expected, however it should average 4 to 6 hours. If numbness and weakness of your extremities begins to develop 4 to 6 hours after your procedure, and is felt to be progressing and worsening, immediately contact your physician.   Diet:  If you experience nausea, do not eat until this sensation goes away. If you had a "Stellate Ganglion Block" for upper extremity "Reflex Sympathetic Dystrophy", do not eat or drink until your   hoarseness goes away. In any case, always start with liquids first and if you tolerate them well, then slowly progress to more solid foods. Activity:  For the first 4 to 6 hours after the procedure, use caution in moving about as you may experience numbness and/or weakness. Use caution in  cooking, using household electrical appliances, and climbing steps. If you need to reach your Doctor call our office: [phone] Monday-Thursday 8:00 am - 4:00 PM    Fridays: Closed     In case of an emergency: In case of emergency, call 911 or go to the nearest emergency room and have the physician there call us.  Interpretation of Procedure Every nerve block has two components: a diagnostic component, and a treatment component. Unrealistic expectations are the most common causes of "perceived failure".  In a perfect world, a single nerve block should be able to completely and permanently eliminate the pain. Sadly, the world is not perfect.  Most pain management nerve blocks are performed using local anesthetics and steroids. Steroids are responsible for any long-term benefit that you may experience. Their purpose is to decrease any chronic swelling that may exist in the area. Steroids begin to work immediately after being injected. However, most patients will not experience any benefits until 5 to 10 days after the injection, when the swelling has come down to the point where they can tell a difference. Steroids will only help if there is swelling to be treated. As such, they can assist with the diagnosis. If effective, they suggest an inflammatory component to the pain, and if ineffective, they rule out inflammation as the main cause or component of the problem. If the problem is one of mechanical compression, you will get no benefit from those steroids.   In the case of local anesthetics, they have a crucial role in the diagnosis of your condition. Most will begin to work within15 to 20 minutes after injection. The duration will depend on the type used (short- vs. Long-acting). It is of outmost importance that patients keep tract of their pain, after the procedure. To assist with this matter, a "Post-procedure Pain Diary" is provided. Make sure to complete it and to bring it back to your  follow-up appointment.  As long as the patient keeps accurate, detailed records of their symptoms after every procedure, and returns to have those interpreted, every procedure will provide us with invaluable information. Even a block that does not provide the patient with any relief, will always provide us with information about the mechanism and the origin of the pain. The only time a nerve block can be considered a waste of time is when patients do not keep track of the results, or do not keep their post-procedure appointment.  Reporting the results back to your physician The Pain Score  Pain is a subjective complaint. It cannot be seen, touched, or measured. We depend entirely on the patient's report of the pain in order to assess your condition and treatment. To evaluate the pain, we use a pain scale, where "0" means "No Pain", and a "10" is "the worst possible pain that you can even imagine" (i.e. something like been eaten alive by a shark or being torn apart by a lion).   You will frequently be asked to rate your pain. Please be as accurate, remember that medical decisions will be based on your responses. Please do not rate your pain above a 10. Doing so is actually interpreted as "symptom magnification" (exaggeration), as   well as lack of understanding with regards to the scale. To put this into perspective, when you tell us that your pain is at a 10 (ten), what you are saying is that there is nothing we can do to make this pain any worse. (Carefully think about that.)Facet Blocks Patient Information  Description: The facets are joints in the spine between the vertebrae.  Like any joints in the body, facets can become irritated and painful.  Arthritis can also effect the facets.  By injecting steroids and local anesthetic in and around these joints, we can temporarily block the nerve supply to them.  Steroids act directly on irritated nerves and tissues to reduce selling and inflammation which often  leads to decreased pain.  Facet blocks may be done anywhere along the spine from the neck to the low back depending upon the location of your pain.   After numbing the skin with local anesthetic (like Novocaine), a small needle is passed onto the facet joints under x-ray guidance.  You may experience a sensation of pressure while this is being done.  The entire block usually lasts about 15-25 minutes.   Conditions which may be treated by facet blocks:   Low back/buttock pain  Neck/shoulder pain  Certain types of headaches  Preparation for the injection:  1. Do not eat any solid food or dairy products within 8 hours of your appointment. 2. You may drink clear liquid up to 3 hours before appointment.  Clear liquids include water, black coffee, juice or soda.  No milk or cream please. 3. You may take your regular medication, including pain medications, with a sip of water before your appointment.  Diabetics should hold regular insulin (if taken separately) and take 1/2 normal NPH dose the morning of the procedure.  Carry some sugar containing items with you to your appointment. 4. A driver must accompany you and be prepared to drive you home after your procedure. 5. Bring all your current medications with you. 6. An IV may be inserted and sedation may be given at the discretion of the physician. 7. A blood pressure cuff, EKG and other monitors will often be applied during the procedure.  Some patients may need to have extra oxygen administered for a short period. 8. You will be asked to provide medical information, including your allergies and medications, prior to the procedure.  We must know immediately if you are taking blood thinners (like Coumadin/Warfarin) or if you are allergic to IV iodine contrast (dye).  We must know if you could possible be pregnant.  Possible side-effects:   Bleeding from needle site  Infection (rare, may require surgery)  Nerve injury (rare)  Numbness &  tingling (temporary)  Difficulty urinating (rare, temporary)  Spinal headache (a headache worse with upright posture)  Light-headedness (temporary)  Pain at injection site (serveral days)  Decreased blood pressure (rare, temporary)  Weakness in arm/leg (temporary)  Pressure sensation in back/neck (temporary)   Call if you experience:   Fever/chills associated with headache or increased back/neck pain  Headache worsened by an upright position  New onset, weakness or numbness of an extremity below the injection site  Hives or difficulty breathing (go to the emergency room)  Inflammation or drainage at the injection site(s)  Severe back/neck pain greater than usual  New symptoms which are concerning to you  Please note:  Although the local anesthetic injected can often make your back or neck feel good for several hours after the injection, the pain  will likely return. It takes 3-7 days for steroids to work.  You may not notice any pain relief for at least one week.  If effective, we will often do a series of 2-3 injections spaced 3-6 weeks apart to maximally decrease your pain.  After the initial series, you may be a candidate for a more permanent nerve block of the facets.  If you have any questions, please call #[phone]) [facility]

## 2016-09-29 NOTE — Progress Notes (Signed)
Subjective:  Patient ID: Nicholas [name], male    DOB: 1945/06/10  Age: 71 y.o. MRN: [id number]  CC: Back Pain (low)   Procedure:  L4-L5 epidural steroid under fluoroscopic guidance without sedation  HPI [name] presents for reevaluation. He continues to have right hip pain and right posterior back pain. The pain is worse with prolonged standing and activity. He occasionally gets pain radiating down into the right posterior leg but rarely beyond the knee. His strength has been at baseline and bowel bladder function has been good. Taking his Vicodin as prescribed without any problems with the medication. Based on his narcotic assessment sheet he seems to continue to derive good lifestyle functional benefit from the medication. Otherwise his pain symptom complex is stable in nature.  Outpatient Medications Prior to Visit  Medication Sig Dispense Refill  . acetaminophen (TYLENOL) 500 MG tablet Take 2,000 mg by mouth daily as needed for moderate pain or headache.    Marland Kitchen ascorbic acid (VITAMIN C) 250 MG tablet Take by mouth.    . Cranberry 500 MG CAPS Take by mouth.    . Cyanocobalamin (B-12) 1000 MCG CAPS Take 1,000 mcg by mouth daily.     Marland Kitchen ELIQUIS 5 MG TABS tablet TAKE 1 TABLET (5 MG TOTAL) BY MOUTH 2 (TWO) TIMES DAILY. 180 tablet 1  . ferrous sulfate 325 (65 FE) MG tablet Take 325 mg by mouth daily with breakfast.    . gabapentin (NEURONTIN) 400 MG capsule Take 1 capsule (400 mg total) by mouth 3 (three) times daily. 270 capsule 4  . hyoscyamine (LEVSIN, ANASPAZ) 0.125 MG tablet Take 0.125 mg by mouth every 6 (six) hours as needed for bladder spasms or cramping.     Marland Kitchen LORazepam (ATIVAN) 1 MG tablet Take 1 tablet (1 mg total) by mouth at bedtime. 90 tablet 1  . Multiple Vitamin (MULTI-VITAMINS) TABS Take by mouth.    . Omega-3 Fatty Acids (FISH OIL) 1200 MG CAPS Take 1,200 mg by mouth daily.    Marland Kitchen oxymetazoline (AFRIN) 0.05 % nasal spray Place 2 sprays into both nostrils at bedtime as needed  for congestion.    . QUEtiapine Fumarate (SEROQUEL XR) 150 MG 24 hr tablet Take 1 tablet (150 mg total) by mouth at bedtime. 90 tablet 4  . simvastatin (ZOCOR) 20 MG tablet Take 1 tablet (20 mg total) by mouth daily. 90 tablet 4  . solifenacin (VESICARE) 10 MG tablet Take 1 tablet (10 mg total) by mouth daily. 90 tablet 4  . venlafaxine XR (EFFEXOR XR) 75 MG 24 hr capsule Take 3 capsules (225 mg total) by mouth daily. 270 capsule 4  . HYDROcodone-acetaminophen (NORCO/VICODIN) 5-325 MG tablet Take 1 tablet by mouth 2 (two) times daily at 10 AM and 5 PM. 60 tablet 0   No facility-administered medications prior to visit.    ROS Review of Systems  Cardiac: No angina Pulmonary: No shortness of breath GI: No constipation  Objective:  BP (!) 162/100   Pulse 79   Temp 98.6 F (37 C)   Resp 20   Ht 5\' 11"  (1.803 m)   Wt 284 lb (128.8 kg)   SpO2 92%   BMI 39.61 kg/m    BP Readings from Last 3 Encounters:  09/29/16 (!) 162/100  09/26/16 (!) 151/85  08/22/16 (!) 156/91     Wt Readings from Last 3 Encounters:  09/29/16 284 lb (128.8 kg)  09/26/16 284 lb (128.8 kg)  08/22/16 284 lb (128.8 kg)  Physical Exam Pt is alert and oriented PERRL EOMI HEART IS RRR no murmur or rub LCTA no wheezing or rhales MUSCULOSKELETALReveals some paraspinous muscle tenderness but no overt trigger points. He does have pain on extension while standing and left lateral rotation yields minimal pain but right lateral rotation with extension yields significant right hip and posterior leg pain. His muscle tone and bulk is good.  Labs  No results found for: HGBA1C Lab Results  Component Value Date   LDLCALC 94 03/23/2016   CREATININE 1.05 08/15/2016    -------------------------------------------------------------------------------------------------------------------- Lab Results  Component Value Date   WBC 5.2 03/23/2016   HGB 15.7 03/23/2016   HCT 46.3 03/23/2016   PLT 166 03/23/2016    GLUCOSE 141 (H) 08/15/2016   CHOL 181 08/15/2016   TRIG 107 08/15/2016   HDL 44 03/23/2016   LDLCALC 94 03/23/2016   ALT 25 08/15/2016   AST 39 (H) 08/15/2016   NA 142 08/15/2016   K 5.0 08/15/2016   CL 104 08/15/2016   CREATININE 1.05 08/15/2016   BUN 20 08/15/2016   CO2 24 08/15/2016   TSH 2.760 03/23/2016   INR 1.2 02/16/2014    --------------------------------------------------------------------------------------------------------------------- No results found.   Assessment & Plan:   Tait was seen today for back pain.  Diagnoses and all orders for this visit:  DDD (degenerative disc disease), lumbar -     Lumbar Epidural Injection -     triamcinolone acetonide (KENALOG-40) injection 40 mg; 1 mL (40 mg total) by Other route once. -     sodium chloride flush (NS) 0.9 % injection 10 mL; 10 mLs by Other route once. -     ropivacaine (PF) 2 mg/mL (0.2%) (NAROPIN) injection 10 mL; 10 mLs by Epidural route once. -     lidocaine (PF) (XYLOCAINE) 1 % injection 5 mL; Inject 5 mLs into the skin once. -     iopamidol (ISOVUE-M) 41 % intrathecal injection 20 mL; 20 mLs by Other route once as needed for contrast.  Bilateral sciatica -     Lumbar Epidural Injection -     triamcinolone acetonide (KENALOG-40) injection 40 mg; 1 mL (40 mg total) by Other route once. -     sodium chloride flush (NS) 0.9 % injection 10 mL; 10 mLs by Other route once. -     ropivacaine (PF) 2 mg/mL (0.2%) (NAROPIN) injection 10 mL; 10 mLs by Epidural route once. -     lidocaine (PF) (XYLOCAINE) 1 % injection 5 mL; Inject 5 mLs into the skin once. -     iopamidol (ISOVUE-M) 41 % intrathecal injection 20 mL; 20 mLs by Other route once as needed for contrast.  Osteoarthritis of right hip, unspecified osteoarthritis type  Facet arthritis of lumbosacral region ([place]) -     LUMBAR FACET(MEDIAL BRANCH NERVE BLOCK) MBNB; Future  Other orders -     HYDROcodone-acetaminophen (NORCO/VICODIN) 5-325 MG tablet; Take  1 tablet by mouth 2 (two) times daily at 10 AM and 5 PM.        ----------------------------------------------------------------------------------------------------------------------  Problem List Items Addressed This Visit    None    Visit Diagnoses    DDD (degenerative disc disease), lumbar    -  Primary   Relevant Medications   HYDROcodone-acetaminophen (NORCO/VICODIN) 5-325 MG tablet   triamcinolone acetonide (KENALOG-40) injection 40 mg (Completed)   sodium chloride flush (NS) 0.9 % injection 10 mL (Completed)   ropivacaine (PF) 2 mg/mL (0.2%) (NAROPIN) injection 10 mL (Completed)   lidocaine (  PF) (XYLOCAINE) 1 % injection 5 mL (Completed)   iopamidol (ISOVUE-M) 41 % intrathecal injection 20 mL   Bilateral sciatica       Relevant Medications   triamcinolone acetonide (KENALOG-40) injection 40 mg (Completed)   sodium chloride flush (NS) 0.9 % injection 10 mL (Completed)   ropivacaine (PF) 2 mg/mL (0.2%) (NAROPIN) injection 10 mL (Completed)   lidocaine (PF) (XYLOCAINE) 1 % injection 5 mL (Completed)   iopamidol (ISOVUE-M) 41 % intrathecal injection 20 mL   Osteoarthritis of right hip, unspecified osteoarthritis type       Relevant Medications   HYDROcodone-acetaminophen (NORCO/VICODIN) 5-325 MG tablet   triamcinolone acetonide (KENALOG-40) injection 40 mg (Completed)   Facet arthritis of lumbosacral region ([facility])       Relevant Medications   HYDROcodone-acetaminophen (NORCO/VICODIN) 5-325 MG tablet   triamcinolone acetonide (KENALOG-40) injection 40 mg (Completed)   Other Relevant Orders   LUMBAR FACET(MEDIAL BRANCH NERVE BLOCK) MBNB        ----------------------------------------------------------------------------------------------------------------------  1. DDD (degenerative disc disease), lumbar We'll proceed with his first lumbar epidural steroid injection today. He is to return to clinic in 1 month for reevaluation possibly a second therapeutic epidural steroid  injection for his sciatica right side. I want him to restart his elliquis tomorrow. He is to discontinue this 3 days prior to his next visit. - Lumbar Epidural Injection - triamcinolone acetonide (KENALOG-40) injection 40 mg; 1 mL (40 mg total) by Other route once. - sodium chloride flush (NS) 0.9 % injection 10 mL; 10 mLs by Other route once. - ropivacaine (PF) 2 mg/mL (0.2%) (NAROPIN) injection 10 mL; 10 mLs by Epidural route once. - lidocaine (PF) (XYLOCAINE) 1 % injection 5 mL; Inject 5 mLs into the skin once. - iopamidol (ISOVUE-M) 41 % intrathecal injection 20 mL; 20 mLs by Other route once as needed for contrast.  2. Bilateral sciatica  - Lumbar Epidural Injection - triamcinolone acetonide (KENALOG-40) injection 40 mg; 1 mL (40 mg total) by Other route once. - sodium chloride flush (NS) 0.9 % injection 10 mL; 10 mLs by Other route once. - ropivacaine (PF) 2 mg/mL (0.2%) (NAROPIN) injection 10 mL; 10 mLs by Epidural route once. - lidocaine (PF) (XYLOCAINE) 1 % injection 5 mL; Inject 5 mLs into the skin once. - iopamidol (ISOVUE-M) 41 % intrathecal injection 20 mL; 20 mLs by Other route once as needed for contrast.  3. Osteoarthritis of right hip, unspecified osteoarthritis type We will refill his Vicodin for October 11 and we've reviewed the practitioner database information and it is appropriate.    4. Facet arthritis of lumbosacral region [facility]) I'm scheduling him for a diagnostic facet block at his next visit in one month. - LUMBAR FACET(MEDIAL BRANCH NERVE BLOCK) MBNB; Future    ----------------------------------------------------------------------------------------------------------------------  I am having Mr. Skillern maintain his oxymetazoline, B-12, ferrous sulfate, hyoscyamine, Fish Oil, acetaminophen, ascorbic acid, Cranberry, MULTI-VITAMINS, gabapentin, QUEtiapine Fumarate, simvastatin, solifenacin, venlafaxine XR, ELIQUIS, LORazepam, and HYDROcodone-acetaminophen. We  administered triamcinolone acetonide, sodium chloride flush, ropivacaine (PF) 2 mg/mL (0.2%), lidocaine (PF), and iopamidol.   Meds ordered this encounter  Medications  . HYDROcodone-acetaminophen (NORCO/VICODIN) 5-325 MG tablet    Sig: Take 1 tablet by mouth 2 (two) times daily at 10 AM and 5 PM.    Dispense:  60 tablet    Refill:  0    Do not fill until [id number]  . triamcinolone acetonide (KENALOG-40) injection 40 mg  . sodium chloride flush (NS) 0.9 % injection 10 mL  . ropivacaine (  PF) 2 mg/mL (0.2%) (NAROPIN) injection 10 mL  . lidocaine (PF) (XYLOCAINE) 1 % injection 5 mL  . iopamidol (ISOVUE-M) 41 % intrathecal injection 20 mL   Patient's Medications  New Prescriptions   No medications on file  Previous Medications   ACETAMINOPHEN (TYLENOL) 500 MG TABLET    Take 2,000 mg by mouth daily as needed for moderate pain or headache.   ASCORBIC ACID (VITAMIN C) 250 MG TABLET    Take by mouth.   CRANBERRY 500 MG CAPS    Take by mouth.   CYANOCOBALAMIN (B-12) 1000 MCG CAPS    Take 1,000 mcg by mouth daily.    ELIQUIS 5 MG TABS TABLET    TAKE 1 TABLET (5 MG TOTAL) BY MOUTH 2 (TWO) TIMES DAILY.   FERROUS SULFATE 325 (65 FE) MG TABLET    Take 325 mg by mouth daily with breakfast.   GABAPENTIN (NEURONTIN) 400 MG CAPSULE    Take 1 capsule (400 mg total) by mouth 3 (three) times daily.   HYOSCYAMINE (LEVSIN, ANASPAZ) 0.125 MG TABLET    Take 0.125 mg by mouth every 6 (six) hours as needed for bladder spasms or cramping.    LORAZEPAM (ATIVAN) 1 MG TABLET    Take 1 tablet (1 mg total) by mouth at bedtime.   MULTIPLE VITAMIN (MULTI-VITAMINS) TABS    Take by mouth.   OMEGA-3 FATTY ACIDS (FISH OIL) 1200 MG CAPS    Take 1,200 mg by mouth daily.   OXYMETAZOLINE (AFRIN) 0.05 % NASAL SPRAY    Place 2 sprays into both nostrils at bedtime as needed for congestion.   QUETIAPINE FUMARATE (SEROQUEL XR) 150 MG 24 HR TABLET    Take 1 tablet (150 mg total) by mouth at bedtime.   SIMVASTATIN (ZOCOR) 20 MG  TABLET    Take 1 tablet (20 mg total) by mouth daily.   SOLIFENACIN (VESICARE) 10 MG TABLET    Take 1 tablet (10 mg total) by mouth daily.   VENLAFAXINE XR (EFFEXOR XR) 75 MG 24 HR CAPSULE    Take 3 capsules (225 mg total) by mouth daily.  Modified Medications   Modified Medication Previous Medication   HYDROCODONE-ACETAMINOPHEN (NORCO/VICODIN) 5-325 MG TABLET HYDROcodone-acetaminophen (NORCO/VICODIN) 5-325 MG tablet      Take 1 tablet by mouth 2 (two) times daily at 10 AM and 5 PM.    Take 1 tablet by mouth 2 (two) times daily at 10 AM and 5 PM.  Discontinued Medications   No medications on file   ----------------------------------------------------------------------------------------------------------------------  Follow-up: Return for evaluation, procedure.  Procedure: L4-5 epidural steroid under fluoroscopic guidance without sedation.   Procedure: L4-5 LESI with fluoroscopic guidance and moderate sedation  NOTE: The risks, benefits, and expectations of the procedure have been discussed and explained to the patient who was understanding and in agreement with suggested treatment plan. No guarantees were made.  DESCRIPTION OF PROCEDURE: Lumbar epidural steroid injection with no IV Versed, EKG, blood pressure, pulse, and pulse oximetry monitoring. The procedure was performed with the patient in the prone position under fluoroscopic guidance. I injected subcutaneous lidocaine overlying the L4-5 site after its fluoroscopic identifictation.  Using strict aseptic technique, I then advanced an 18-gauge Tuohy epidural needle in the midline using interlaminar approach via loss-of-resistance to saline technique. There was negative aspiration for heme or  CSF.  I then confirmed position with both AP and Lateral fluoroscan. 2 cc of Isovue were injected and a  total of 5 mL of Preservative-Free normal saline  mixed with 40 mg of Kenalog and 1cc Ropicaine 0.2 percent were injected incrementally via the   epidurally placed needle. The needle was removed. The patient tolerated the injection well and was convalesced and discharged to home in stable condition. Should the patient have any post procedure difficulty they have been instructed on how to contact us for assistance.    [name], MD

## 2016-10-02 ENCOUNTER — Telehealth: Payer: Self-pay

## 2016-10-02 NOTE — Telephone Encounter (Signed)
Post procedure phone call.  Patient states he is doing good and was able to play golf yesterday.

## 2016-10-10 ENCOUNTER — Encounter: Payer: Medicare Other | Admitting: Anesthesiology

## 2016-10-11 ENCOUNTER — Telehealth: Payer: Self-pay | Admitting: Internal Medicine

## 2016-10-11 NOTE — Telephone Encounter (Signed)
Transmission received. Normal device function. HBP lead. Presenting rhythm: AsVp with PACs. 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 AF burden and ventricular rates stable from previous device checks.

## 2016-10-11 NOTE — Telephone Encounter (Signed)
S/w patient. He's been as he describes "more short of breath lately with activity." He says when he's exerting himself such as taking multiple trips to the garbage can or cleaning leaves out of the pool he gets short of breath. Once he sits down to rest it subsides after a few minutes. He is rarely dizzy but its sometimes when he gets up too fast. Denies chest pain, palpitations or swelling.  He feels a "heaviness" in his chest when he exerts himself.  Advised patient to send a remote transmission to device clinic when he gets back home which will be in about an hour. Will route to Dr. Caryl Comes, Alvis Lemmings and the device clinic for review.

## 2016-10-11 NOTE — Telephone Encounter (Signed)
Pt wife states pt has been getting out of breath for the last week. Denies any swelling. He is ok, as long as he is sitting. Please call.

## 2016-10-12 NOTE — Telephone Encounter (Signed)
LMTCB//sss 

## 2016-10-12 NOTE — Telephone Encounter (Signed)
Pt is calling back regarding his transmission sent yesterday. Please call.

## 2016-10-13 NOTE — Telephone Encounter (Signed)
Nicholas [name] returning call. I informed him that his transmission looked similar to previous transmissions, no episodes other than AF. He verbalizes understanding. He had a good day yesterday and feels fine currently. I will route to Triage to follow up.   Please call [phone] to follow up.

## 2016-10-13 NOTE — Telephone Encounter (Signed)
Noted  

## 2016-10-13 NOTE — Telephone Encounter (Signed)
I left a message for the patient/ his wife to call back.

## 2016-10-19 ENCOUNTER — Encounter: Payer: Self-pay | Admitting: Anesthesiology

## 2016-10-19 ENCOUNTER — Ambulatory Visit
Admission: RE | Admit: 2016-10-19 | Discharge: 2016-10-19 | Disposition: A | Discharge status: Home / Self Care | Payer: Medicare Other | Source: Ambulatory Visit | Attending: Anesthesiology | Admitting: Anesthesiology

## 2016-10-19 ENCOUNTER — Other Ambulatory Visit: Payer: Self-pay | Admitting: Anesthesiology

## 2016-10-19 ENCOUNTER — Ambulatory Visit (HOSPITAL_BASED_OUTPATIENT_CLINIC_OR_DEPARTMENT_OTHER): Payer: Medicare Other | Admitting: Anesthesiology

## 2016-10-19 VITALS — BP 142/80 | HR 70 | Temp 98.9°F | Resp 13 | Ht 71.0 in | Wt 284.0 lb

## 2016-10-19 DIAGNOSIS — M1612 Unilateral primary osteoarthritis, left hip: Secondary | ICD-10-CM | POA: Diagnosis not present

## 2016-10-19 DIAGNOSIS — M5136 Other intervertebral disc degeneration, lumbar region: Secondary | ICD-10-CM

## 2016-10-19 DIAGNOSIS — M5442 Lumbago with sciatica, left side: Secondary | ICD-10-CM | POA: Insufficient documentation

## 2016-10-19 DIAGNOSIS — M47817 Spondylosis without myelopathy or radiculopathy, lumbosacral region: Secondary | ICD-10-CM

## 2016-10-19 DIAGNOSIS — F1190 Opioid use, unspecified, uncomplicated: Secondary | ICD-10-CM

## 2016-10-19 DIAGNOSIS — G894 Chronic pain syndrome: Secondary | ICD-10-CM | POA: Insufficient documentation

## 2016-10-19 DIAGNOSIS — M1611 Unilateral primary osteoarthritis, right hip: Secondary | ICD-10-CM | POA: Diagnosis not present

## 2016-10-19 DIAGNOSIS — M545 Low back pain: Secondary | ICD-10-CM | POA: Diagnosis present

## 2016-10-19 DIAGNOSIS — R52 Pain, unspecified: Secondary | ICD-10-CM

## 2016-10-19 DIAGNOSIS — G8929 Other chronic pain: Secondary | ICD-10-CM

## 2016-10-19 DIAGNOSIS — M5441 Lumbago with sciatica, right side: Secondary | ICD-10-CM

## 2016-10-19 DIAGNOSIS — Z79891 Long term (current) use of opiate analgesic: Secondary | ICD-10-CM | POA: Diagnosis not present

## 2016-10-19 DIAGNOSIS — Z7901 Long term (current) use of anticoagulants: Secondary | ICD-10-CM | POA: Diagnosis not present

## 2016-10-19 DIAGNOSIS — M5431 Sciatica, right side: Secondary | ICD-10-CM

## 2016-10-19 DIAGNOSIS — M5432 Sciatica, left side: Secondary | ICD-10-CM

## 2016-10-19 DIAGNOSIS — M79643 Pain in unspecified hand: Secondary | ICD-10-CM

## 2016-10-19 DIAGNOSIS — M4697 Unspecified inflammatory spondylopathy, lumbosacral region: Secondary | ICD-10-CM

## 2016-10-19 MED ORDER — ROPIVACAINE HCL 2 MG/ML IJ SOLN
INTRAMUSCULAR | Status: AC
  Filled 2016-10-19: qty 10

## 2016-10-19 MED ORDER — HYDROCODONE-ACETAMINOPHEN 5-325 MG PO TABS: 1.0000 | ORAL_TABLET | Freq: Two times a day (BID) | ORAL | 0 refills | Status: DC

## 2016-10-19 MED ORDER — SODIUM CHLORIDE 0.9 % IJ SOLN
INTRAMUSCULAR | Status: AC
  Filled 2016-10-19: qty 10

## 2016-10-19 MED ORDER — LIDOCAINE HCL (PF) 1 % IJ SOLN
INTRAMUSCULAR | Status: AC
  Filled 2016-10-19: qty 5

## 2016-10-19 MED ORDER — TRIAMCINOLONE ACETONIDE 40 MG/ML IJ SUSP
INTRAMUSCULAR | Status: AC
  Filled 2016-10-19: qty 1

## 2016-10-19 NOTE — Procedures (Signed)
MAR NOT SHOWING UP ON PATIENT  iSOVUE GIVEN nAROPIN 1ML GIVEN Ns 9ML GIVEN kENALOG 40MG  GIVEN lIDOCAINE 1% GIVEN  gAVE WHAT WAS TAKEN FROM pYXIS

## 2016-10-19 NOTE — Progress Notes (Signed)
Subjective:  Patient ID: Nicholas [name], male    DOB: [birth year]  Age: 71 y.o. MRN: [id number]  CC: Back Pain (low)   Procedure: L5-S1 epidural steroid without sedation   HPI Nicholas [name] presents for reevaluation. He was last seen several weeks ago and had his first epidural at that time. He reports that he's had minimal low back pain since that time. He has more stiffness in the morning when he gets out of bed. The severe pain there is having in his low back has remitted. No troubles with lower extremity strength or function are noted and his bowel and bladder function have been stable as well. Otherwise he feels that he is doing well. He's taking his medications as prescribed including hydrocodone 1 or 2 tablets per day. He has been off his eliquis for the recommended period of time.   Outpatient Medications Prior to Visit  Medication Sig Dispense Refill  . acetaminophen (TYLENOL) 500 MG tablet Take 2,000 mg by mouth daily as needed for moderate pain or headache.    Marland Kitchen ascorbic acid (VITAMIN C) 250 MG tablet Take by mouth.    . Cranberry 500 MG CAPS Take by mouth.    . Cyanocobalamin (B-12) 1000 MCG CAPS Take 1,000 mcg by mouth daily.     Marland Kitchen ELIQUIS 5 MG TABS tablet TAKE 1 TABLET (5 MG TOTAL) BY MOUTH 2 (TWO) TIMES DAILY. 180 tablet 1  . ferrous sulfate 325 (65 FE) MG tablet Take 325 mg by mouth daily with breakfast.    . gabapentin (NEURONTIN) 400 MG capsule Take 1 capsule (400 mg total) by mouth 3 (three) times daily. 270 capsule 4  . hyoscyamine (LEVSIN, ANASPAZ) 0.125 MG tablet Take 0.125 mg by mouth every 6 (six) hours as needed for bladder spasms or cramping.     Marland Kitchen LORazepam (ATIVAN) 1 MG tablet Take 1 tablet (1 mg total) by mouth at bedtime. 90 tablet 1  . Multiple Vitamin (MULTI-VITAMINS) TABS Take by mouth.    . Omega-3 Fatty Acids (FISH OIL) 1200 MG CAPS Take 1,200 mg by mouth daily.    Marland Kitchen oxymetazoline (AFRIN) 0.05 % nasal spray Place 2 sprays into both nostrils at bedtime as  needed for congestion.    . QUEtiapine Fumarate (SEROQUEL XR) 150 MG 24 hr tablet Take 1 tablet (150 mg total) by mouth at bedtime. 90 tablet 4  . simvastatin (ZOCOR) 20 MG tablet Take 1 tablet (20 mg total) by mouth daily. 90 tablet 4  . solifenacin (VESICARE) 10 MG tablet Take 1 tablet (10 mg total) by mouth daily. 90 tablet 4  . venlafaxine XR (EFFEXOR XR) 75 MG 24 hr capsule Take 3 capsules (225 mg total) by mouth daily. 270 capsule 4  . HYDROcodone-acetaminophen (NORCO/VICODIN) 5-325 MG tablet Take 1 tablet by mouth 2 (two) times daily at 10 AM and 5 PM. 60 tablet 0   No facility-administered medications prior to visit.     Review of Systems CNS: No dizziness or sedation Cardiac: No angina or palpitations GI: No constipation or abdominal pain  Objective:  BP 140/81   Pulse 73   Temp 98.9 F (37.2 C)   Resp 12   Ht 5\' 11"  (1.803 m)   Wt 284 lb (128.8 kg)   SpO2 96%   BMI 39.61 kg/m    BP Readings from Last 3 Encounters:  10/19/16 140/81  09/29/16 (!) 146/96  09/26/16 (!) 151/85     Wt Readings from Last 3 Encounters:  10/19/16 284 lb (128.8 kg)  09/29/16 284 lb (128.8 kg)  09/26/16 284 lb (128.8 kg)     Physical Exam Pt is alert and oriented PERRL EOMI HEART IS RRR no murmur or rub LCTA no wheezing or rhales MUSCULOSKELETALSome persistent paraspinous muscle tenderness but less than on previous evaluation. He has no overt trigger points in the low lumbar region and his  Labs  No results found for: HGBA1C Lab Results  Component Value Date   LDLCALC 94 03/23/2016   CREATININE 1.05 08/15/2016    -------------------------------------------------------------------------------------------------------------------- Lab Results  Component Value Date   WBC 5.2 03/23/2016   HGB 15.7 03/23/2016   HCT 46.3 03/23/2016   PLT 166 03/23/2016   GLUCOSE 141 (H) 08/15/2016   CHOL 181 08/15/2016   TRIG 107 08/15/2016   HDL 44 03/23/2016   LDLCALC 94 03/23/2016    ALT 25 08/15/2016   AST 39 (H) 08/15/2016   NA 142 08/15/2016   K 5.0 08/15/2016   CL 104 08/15/2016   CREATININE 1.05 08/15/2016   BUN 20 08/15/2016   CO2 24 08/15/2016   TSH 2.760 03/23/2016   INR 1.2 02/16/2014    --------------------------------------------------------------------------------------------------------------------- No results found.   Assessment & Plan:   Nicholas [name] was seen today for back pain.  Diagnoses and all orders for this visit:  DDD (degenerative disc disease), lumbar -     Lumbar Epidural Injection; Future  Bilateral sciatica -     Lumbar Epidural Injection; Future  Osteoarthritis of right hip, unspecified osteoarthritis type  Facet arthritis of lumbosacral region [facility])  Chronic pain syndrome  Chronic, continuous use of opioids  Chronic hand pain, unspecified laterality  Primary osteoarthritis of left hip  Chronic bilateral low back pain with bilateral sciatica  Other orders -     HYDROcodone-acetaminophen (NORCO/VICODIN) 5-325 MG tablet; Take 1 tablet by mouth 2 (two) times daily at 10 AM and 5 PM.        ----------------------------------------------------------------------------------------------------------------------  Problem List Items Addressed This Visit    None    Visit Diagnoses    DDD (degenerative disc disease), lumbar    -  Primary   Relevant Medications   HYDROcodone-acetaminophen (NORCO/VICODIN) 5-325 MG tablet   Other Relevant Orders   Lumbar Epidural Injection   Bilateral sciatica       Relevant Orders   Lumbar Epidural Injection   Osteoarthritis of right hip, unspecified osteoarthritis type       Relevant Medications   HYDROcodone-acetaminophen (NORCO/VICODIN) 5-325 MG tablet   Facet arthritis of lumbosacral region ([place])       Relevant Medications   HYDROcodone-acetaminophen (NORCO/VICODIN) 5-325 MG tablet   Chronic pain syndrome       Chronic, continuous use of opioids       Chronic hand pain, unspecified  laterality       Relevant Medications   HYDROcodone-acetaminophen (NORCO/VICODIN) 5-325 MG tablet   Primary osteoarthritis of left hip       Relevant Medications   HYDROcodone-acetaminophen (NORCO/VICODIN) 5-325 MG tablet   Chronic bilateral low back pain with bilateral sciatica       Relevant Medications   HYDROcodone-acetaminophen (NORCO/VICODIN) 5-325 MG tablet        ----------------------------------------------------------------------------------------------------------------------  1. DDD (degenerative disc disease), lumbar We'll plan on proceeding with a second epidural injection today with return to clinic in 2 months. We are planning for a third injection at that time if indicated. We've gone over the risks and benefits of the procedure once again. We'll have him  restart his blood center tomorrow and discontinue 7 days prior to his next visit if he plans on having an epidural at that time. In the meantime continue with efforts of back stretching strengthening and weight loss. He can continue playing golf. - Lumbar Epidural Injection; Future  2. Bilateral sciatica As above - Lumbar Epidural Injection; Future  3. Osteoarthritis of right hip, unspecified osteoarthritis type   4. Facet arthritis of lumbosacral region ([place])   5. Chronic pain syndrome We will refill his medications for the next 2 months. He currently has a prescription for October 11 and a prescription was given for November 10. We have reviewed to [facility] practitioner database and it is appropriate.  6. Chronic, continuous use of opioids As above  7. Chronic hand pain, unspecified laterality   8. Primary osteoarthritis of left hip   9. Chronic bilateral low back pain with bilateral sciatica     ----------------------------------------------------------------------------------------------------------------------  I am having Nicholas [name] maintain his oxymetazoline, B-12, ferrous sulfate,  hyoscyamine, Fish Oil, acetaminophen, ascorbic acid, Cranberry, MULTI-VITAMINS, gabapentin, QUEtiapine Fumarate, simvastatin, solifenacin, venlafaxine XR, ELIQUIS, LORazepam, and HYDROcodone-acetaminophen.   Meds ordered this encounter  Medications  . HYDROcodone-acetaminophen (NORCO/VICODIN) 5-325 MG tablet    Sig: Take 1 tablet by mouth 2 (two) times daily at 10 AM and 5 PM.    Dispense:  60 tablet    Refill:  0    Do not fill until [id number]   Patient's Medications  New Prescriptions   No medications on file  Previous Medications   ACETAMINOPHEN (TYLENOL) 500 MG TABLET    Take 2,000 mg by mouth daily as needed for moderate pain or headache.   ASCORBIC ACID (VITAMIN C) 250 MG TABLET    Take by mouth.   CRANBERRY 500 MG CAPS    Take by mouth.   CYANOCOBALAMIN (B-12) 1000 MCG CAPS    Take 1,000 mcg by mouth daily.    ELIQUIS 5 MG TABS TABLET    TAKE 1 TABLET (5 MG TOTAL) BY MOUTH 2 (TWO) TIMES DAILY.   FERROUS SULFATE 325 (65 FE) MG TABLET    Take 325 mg by mouth daily with breakfast.   GABAPENTIN (NEURONTIN) 400 MG CAPSULE    Take 1 capsule (400 mg total) by mouth 3 (three) times daily.   HYOSCYAMINE (LEVSIN, ANASPAZ) 0.125 MG TABLET    Take 0.125 mg by mouth every 6 (six) hours as needed for bladder spasms or cramping.    LORAZEPAM (ATIVAN) 1 MG TABLET    Take 1 tablet (1 mg total) by mouth at bedtime.   MULTIPLE VITAMIN (MULTI-VITAMINS) TABS    Take by mouth.   OMEGA-3 FATTY ACIDS (FISH OIL) 1200 MG CAPS    Take 1,200 mg by mouth daily.   OXYMETAZOLINE (AFRIN) 0.05 % NASAL SPRAY    Place 2 sprays into both nostrils at bedtime as needed for congestion.   QUETIAPINE FUMARATE (SEROQUEL XR) 150 MG 24 HR TABLET    Take 1 tablet (150 mg total) by mouth at bedtime.   SIMVASTATIN (ZOCOR) 20 MG TABLET    Take 1 tablet (20 mg total) by mouth daily.   SOLIFENACIN (VESICARE) 10 MG TABLET    Take 1 tablet (10 mg total) by mouth daily.   VENLAFAXINE XR (EFFEXOR XR) 75 MG 24 HR CAPSULE    Take 3  capsules (225 mg total) by mouth daily.  Modified Medications   Modified Medication Previous Medication   HYDROCODONE-ACETAMINOPHEN (NORCO/VICODIN) 5-325 MG TABLET HYDROcodone-acetaminophen (NORCO/VICODIN)  5-325 MG tablet      Take 1 tablet by mouth 2 (two) times daily at 10 AM and 5 PM.    Take 1 tablet by mouth 2 (two) times daily at 10 AM and 5 PM.  Discontinued Medications   No medications on file   ---------------------------------------------------------------------------------------------------------------------- Procedure: L4-5 epidural steroid No. 2 under fluoroscopic guidance without sedation  Procedure: L4-5 LESI with fluoroscopic guidance and moderate sedation  NOTE: The risks, benefits, and expectations of the procedure have been discussed and explained to the patient who was understanding and in agreement with suggested treatment plan. No guarantees were made.  DESCRIPTION OF PROCEDURE: Lumbar epidural steroid injection with no IV Versed, EKG, blood pressure, pulse, and pulse oximetry monitoring. The procedure was performed with the patient in the prone position under fluoroscopic guidance. I injected subcutaneous lidocaine overlying the L4-5 site after its fluoroscopic identifictation.  Using strict aseptic technique, I then advanced an 18-gauge Tuohy epidural needle in the midline using interlaminar approach via loss-of-resistance to saline technique. There was negative aspiration for heme or  CSF.  I then confirmed position with both AP and Lateral fluoroscan. 2 cc of Isovue were injected and a  total of 5 mL of Preservative-Free normal saline mixed with 40 mg of Kenalog and 1cc Ropicaine 0.2 percent were injected incrementally via the  epidurally placed needle. The needle was removed. The patient tolerated the injection well and was convalesced and discharged to home in stable condition. Should the patient have any post procedure difficulty they have been instructed on how to contact  us for assistance.    Follow-up: Return for med refill, procedure.    [name], MD

## 2016-10-19 NOTE — Patient Instructions (Signed)

## 2016-10-19 NOTE — Telephone Encounter (Signed)
I spoke with Mrs. Staples today to get an update on how Mr. Morawski is doing with his SOB.  She states this is improved some from last week.  He did have quite a bit of shortness of breath last week on Wednesday. Reviewing Device clinic nurses notes, the patient was with some a-fib, but not more than noted on previous transmissions.  I have advised Mrs. Vanwieren, if the patient's breathing worsens or this happens more often and is lasting longer, to please call back and we will need to see him in the office. He is currently only on eliquis, but nothing for his a-fib. She voices understanding.

## 2016-10-20 ENCOUNTER — Telehealth: Payer: Self-pay

## 2016-11-02 ENCOUNTER — Telehealth: Payer: Self-pay | Admitting: *Deleted

## 2016-11-02 NOTE — Telephone Encounter (Signed)
Pharmacy from [facility] called to verify Rx for hydrocodone - apap 5 - 325 mg written 09/29/16 to fill on 11/02/16.  Verification given.

## 2016-11-21 ENCOUNTER — Ambulatory Visit (INDEPENDENT_AMBULATORY_CARE_PROVIDER_SITE_OTHER): Payer: Medicare Other | Admitting: *Deleted

## 2016-11-21 ENCOUNTER — Other Ambulatory Visit: Payer: Self-pay | Admitting: Family Medicine

## 2016-11-21 DIAGNOSIS — I442 Atrioventricular block, complete: Secondary | ICD-10-CM | POA: Diagnosis not present

## 2016-11-21 NOTE — Telephone Encounter (Signed)
Refill for Lorazepam.

## 2016-11-21 NOTE — Progress Notes (Signed)
Remote pacemaker transmission.   

## 2016-11-22 ENCOUNTER — Telehealth: Payer: Self-pay | Admitting: Family Medicine

## 2016-11-22 NOTE — Telephone Encounter (Signed)
See phone call from 11/21/16. Closing this encounter.

## 2016-11-22 NOTE — Telephone Encounter (Signed)
Call pt No early refills on lorazepam. Was given a six-month supply in July.

## 2016-11-22 NOTE — Telephone Encounter (Signed)
Copied from [place] #[id number]. Topic: Quick Communication - See Telephone Encounter >> Nov 22, 2016  1:27 PM Boyd Kerbs wrote: CRM for notification. See Telephone encounter for:  11/22/16. Patient said [facility] [place] put in for his Lorazepam and pharmacist said refill prescript was denied

## 2016-11-23 LAB — CUP PACEART REMOTE DEVICE CHECK
Battery Voltage: 3.01 V
Brady Statistic AP VP Percent: 27.1 %
Brady Statistic AS VP Percent: 71.23 %
Brady Statistic RA Percent Paced: 23.8 %
Date Time Interrogation Session: 20181030181915
Implantable Lead Implant Date: 20180124
Implantable Lead Location: 753859
Implantable Lead Model: 3830
Implantable Pulse Generator Implant Date: 20180124
Lead Channel Impedance Value: 323 Ohm
Lead Channel Impedance Value: 361 Ohm
Lead Channel Pacing Threshold Amplitude: 0.625 V
Lead Channel Pacing Threshold Pulse Width: 0.4 ms
Lead Channel Pacing Threshold Pulse Width: 0.4 ms
Lead Channel Sensing Intrinsic Amplitude: 1.5 mV
Lead Channel Setting Pacing Amplitude: 2 V
Lead Channel Setting Pacing Pulse Width: 1 ms
Lead Channel Setting Sensing Sensitivity: 0.9 mV
MDC IDC LEAD IMPLANT DT: 20180124
MDC IDC LEAD LOCATION: 753860
MDC IDC MSMT BATTERY REMAINING LONGEVITY: 83 mo
MDC IDC MSMT LEADCHNL RA IMPEDANCE VALUE: 475 Ohm
MDC IDC MSMT LEADCHNL RA SENSING INTR AMPL: 6.125 mV
MDC IDC MSMT LEADCHNL RA SENSING INTR AMPL: 6.125 mV
MDC IDC MSMT LEADCHNL RV IMPEDANCE VALUE: 513 Ohm
MDC IDC MSMT LEADCHNL RV PACING THRESHOLD AMPLITUDE: 0.75 V
MDC IDC MSMT LEADCHNL RV SENSING INTR AMPL: 1.5 mV
MDC IDC SET LEADCHNL RV PACING AMPLITUDE: 2.5 V
MDC IDC STAT BRADY AP VS PERCENT: 0.02 %
MDC IDC STAT BRADY AS VS PERCENT: 1.65 %
MDC IDC STAT BRADY RV PERCENT PACED: 98.24 %

## 2016-11-29 ENCOUNTER — Encounter: Payer: Self-pay | Admitting: Cardiology

## 2016-11-29 ENCOUNTER — Telehealth: Payer: Self-pay

## 2016-11-29 NOTE — Telephone Encounter (Signed)
Pt wife called and left a message on triage line that pt takes vesicare and needs a refill.  Pt has not been seen since 02/2015. Called pt back in reference to needing an OV for further refills. Pt stated that he did not even know why he was taking the medication. Reinforced with pt the purpose of vesicare. Pt stated that he needed to speak with his wife first and then would call back.

## 2016-12-05 DIAGNOSIS — Z961 Presence of intraocular lens: Secondary | ICD-10-CM | POA: Diagnosis not present

## 2016-12-05 DIAGNOSIS — H26493 Other secondary cataract, bilateral: Secondary | ICD-10-CM | POA: Diagnosis not present

## 2016-12-05 DIAGNOSIS — H353131 Nonexudative age-related macular degeneration, bilateral, early dry stage: Secondary | ICD-10-CM | POA: Diagnosis not present

## 2016-12-12 ENCOUNTER — Other Ambulatory Visit: Payer: Self-pay

## 2016-12-12 ENCOUNTER — Encounter: Payer: Self-pay | Admitting: Anesthesiology

## 2016-12-12 ENCOUNTER — Ambulatory Visit: Payer: Medicare Other | Attending: Anesthesiology | Admitting: Anesthesiology

## 2016-12-12 VITALS — BP 118/81 | HR 85 | Temp 98.4°F | Resp 18 | Ht 71.0 in | Wt 284.0 lb

## 2016-12-12 DIAGNOSIS — Z79899 Other long term (current) drug therapy: Secondary | ICD-10-CM | POA: Insufficient documentation

## 2016-12-12 DIAGNOSIS — M79643 Pain in unspecified hand: Secondary | ICD-10-CM

## 2016-12-12 DIAGNOSIS — G8929 Other chronic pain: Secondary | ICD-10-CM | POA: Diagnosis not present

## 2016-12-12 DIAGNOSIS — M47817 Spondylosis without myelopathy or radiculopathy, lumbosacral region: Secondary | ICD-10-CM

## 2016-12-12 DIAGNOSIS — M1611 Unilateral primary osteoarthritis, right hip: Secondary | ICD-10-CM | POA: Diagnosis not present

## 2016-12-12 DIAGNOSIS — M4697 Unspecified inflammatory spondylopathy, lumbosacral region: Secondary | ICD-10-CM

## 2016-12-12 DIAGNOSIS — Z79891 Long term (current) use of opiate analgesic: Secondary | ICD-10-CM | POA: Insufficient documentation

## 2016-12-12 DIAGNOSIS — M170 Bilateral primary osteoarthritis of knee: Secondary | ICD-10-CM | POA: Diagnosis not present

## 2016-12-12 DIAGNOSIS — M1612 Unilateral primary osteoarthritis, left hip: Secondary | ICD-10-CM | POA: Diagnosis not present

## 2016-12-12 DIAGNOSIS — M5431 Sciatica, right side: Secondary | ICD-10-CM

## 2016-12-12 DIAGNOSIS — G894 Chronic pain syndrome: Secondary | ICD-10-CM | POA: Insufficient documentation

## 2016-12-12 DIAGNOSIS — M79641 Pain in right hand: Secondary | ICD-10-CM | POA: Insufficient documentation

## 2016-12-12 DIAGNOSIS — M79642 Pain in left hand: Secondary | ICD-10-CM | POA: Insufficient documentation

## 2016-12-12 DIAGNOSIS — F1190 Opioid use, unspecified, uncomplicated: Secondary | ICD-10-CM | POA: Diagnosis not present

## 2016-12-12 DIAGNOSIS — M160 Bilateral primary osteoarthritis of hip: Secondary | ICD-10-CM | POA: Diagnosis not present

## 2016-12-12 DIAGNOSIS — M5432 Sciatica, left side: Secondary | ICD-10-CM | POA: Insufficient documentation

## 2016-12-12 DIAGNOSIS — M5136 Other intervertebral disc degeneration, lumbar region: Secondary | ICD-10-CM | POA: Insufficient documentation

## 2016-12-12 MED ORDER — HYDROCODONE-ACETAMINOPHEN 5-325 MG PO TABS: 1.0000 | ORAL_TABLET | Freq: Two times a day (BID) | ORAL | 0 refills | Status: DC

## 2016-12-12 MED ORDER — HYDROCODONE-ACETAMINOPHEN 5-325 MG PO TABS
1.0000 | ORAL_TABLET | Freq: Two times a day (BID) | ORAL | 0 refills | Status: DC
Start: 2016-12-12 — End: 2017-02-27

## 2016-12-12 NOTE — Progress Notes (Signed)
Nursing Pain Medication Assessment:  Safety precautions to be maintained throughout the outpatient stay will include: orient to surroundings, keep bed in low position, maintain call bell within reach at all times, provide assistance with transfer out of bed and ambulation.  Medication Inspection Compliance: Pill count conducted under aseptic conditions, in front of the patient. Neither the pills nor the bottle was removed from the patient's sight at any time. Once count was completed pills were immediately returned to the patient in their original bottle.  Medication: Hydrocodone/APAP Pill/Patch Count: 42 of 60 pills remain Pill/Patch Appearance: Markings consistent with prescribed medication Bottle Appearance: Standard pharmacy container. Clearly labeled. Filled Date: 11/12 / 2018 Last Medication intake:  Today

## 2016-12-15 ENCOUNTER — Encounter: Payer: Self-pay | Admitting: Anesthesiology

## 2016-12-15 NOTE — Progress Notes (Signed)
Subjective:  Patient ID: Nicholas [name], male    DOB: January 20, 1946  Age: 71 y.o. MRN: [id number]  CC: Hand Pain (bilateral)   Procedure: None  HPI Nicholas [name] presents for evaluation.  He was last seen a few months ago and has been doing well in regards to his low back pain.  He still has some intermittent low back pain of the same quality characteristic and distribution as previously documented.  Occasionally he gets some radiating pain down his legs but his medication management seems to keep this intact.  Based on his narcotic assessment she continues to derive good functional lifestyle improvement with his medications.  He still taking his Eliquis and he is status post a series of epidurals back in September.  He is continue to try and do his back stretching strengthening exercises and generally gets good relief with his medication with occasional breakthrough pain.  No changes in lower extremity strength and function are noted.  Outpatient Medications Prior to Visit  Medication Sig Dispense Refill  . acetaminophen (TYLENOL) 500 MG tablet Take 2,000 mg by mouth daily as needed for moderate pain or headache.    Marland Kitchen ascorbic acid (VITAMIN C) 250 MG tablet Take by mouth.    . Cranberry 500 MG CAPS Take by mouth.    . Cyanocobalamin (B-12) 1000 MCG CAPS Take 1,000 mcg by mouth daily.     Marland Kitchen ELIQUIS 5 MG TABS tablet TAKE 1 TABLET (5 MG TOTAL) BY MOUTH 2 (TWO) TIMES DAILY. 180 tablet 1  . ferrous sulfate 325 (65 FE) MG tablet Take 325 mg by mouth daily with breakfast.    . gabapentin (NEURONTIN) 400 MG capsule Take 1 capsule (400 mg total) by mouth 3 (three) times daily. 270 capsule 4  . hyoscyamine (LEVSIN, ANASPAZ) 0.125 MG tablet Take 0.125 mg by mouth every 6 (six) hours as needed for bladder spasms or cramping.     Marland Kitchen LORazepam (ATIVAN) 1 MG tablet Take 1 tablet (1 mg total) by mouth at bedtime. 90 tablet 1  . Magnesium 400 MG CAPS Take 400 mg by mouth daily.    . Multiple Vitamin  (MULTI-VITAMINS) TABS Take by mouth.    . Omega-3 Fatty Acids (FISH OIL) 1200 MG CAPS Take 1,200 mg by mouth daily.    Marland Kitchen oxymetazoline (AFRIN) 0.05 % nasal spray Place 2 sprays into both nostrils at bedtime as needed for congestion.    . QUEtiapine Fumarate (SEROQUEL XR) 150 MG 24 hr tablet Take 1 tablet (150 mg total) by mouth at bedtime. 90 tablet 4  . simvastatin (ZOCOR) 20 MG tablet Take 1 tablet (20 mg total) by mouth daily. 90 tablet 4  . solifenacin (VESICARE) 10 MG tablet Take 1 tablet (10 mg total) by mouth daily. 90 tablet 4  . venlafaxine XR (EFFEXOR XR) 75 MG 24 hr capsule Take 3 capsules (225 mg total) by mouth daily. 270 capsule 4  . HYDROcodone-acetaminophen (NORCO/VICODIN) 5-325 MG tablet Take 1 tablet by mouth 2 (two) times daily at 10 AM and 5 PM. 60 tablet 0   No facility-administered medications prior to visit.     Review of Systems CNS: No sedation or confusion Cardiac: No angina or palpitations GI: No constipation or abdominal pain  Objective:  BP 118/81   Pulse 85   Temp 98.4 F (36.9 C) (Oral)   Resp 18   Ht 5\' 11"  (1.803 m)   Wt 284 lb (128.8 kg)   SpO2 95%   BMI  39.61 kg/m    BP Readings from Last 3 Encounters:  12/12/16 118/81  10/19/16 (!) 142/80  09/29/16 (!) 146/96     Wt Readings from Last 3 Encounters:  12/12/16 284 lb (128.8 kg)  10/19/16 284 lb (128.8 kg)  09/29/16 284 lb (128.8 kg)     Physical Exam Pt is alert and oriented PERRL EOMI HEART IS RRR no murmur or rub LCTA no wheezing or rhales MUSCULOSKELETAL reveals some paraspinous muscle tenderness but no overt trigger points.  His muscle tone and bulk is at baseline.  Labs  No results found for: HGBA1C Lab Results  Component Value Date   LDLCALC 94 03/23/2016   CREATININE 1.05 08/15/2016    -------------------------------------------------------------------------------------------------------------------- Lab Results  Component Value Date   WBC 5.2 03/23/2016   HGB  15.7 03/23/2016   HCT 46.3 03/23/2016   PLT 166 03/23/2016   GLUCOSE 141 (H) 08/15/2016   CHOL 181 08/15/2016   TRIG 107 08/15/2016   HDL 44 03/23/2016   LDLCALC 94 03/23/2016   ALT 25 08/15/2016   AST 39 (H) 08/15/2016   NA 142 08/15/2016   K 5.0 08/15/2016   CL 104 08/15/2016   CREATININE 1.05 08/15/2016   BUN 20 08/15/2016   CO2 24 08/15/2016   TSH 2.760 03/23/2016   INR 1.2 02/16/2014    --------------------------------------------------------------------------------------------------------------------- Dg C-arm 1-60 Min-no Report  Result Date: 10/19/2016 Fluoroscopy was utilized by the requesting physician.  No radiographic interpretation.     Assessment & Plan:   Nicholas [name] was seen today for hand pain.  Diagnoses and all orders for this visit:  DDD (degenerative disc disease), lumbar  Bilateral sciatica  Osteoarthritis of right hip, unspecified osteoarthritis type  Facet arthritis of lumbosacral region [facility])  Chronic pain syndrome  Chronic, continuous use of opioids  Primary osteoarthritis of left hip  Chronic hand pain, unspecified laterality  Primary osteoarthritis of both knees  Other orders -     Discontinue: HYDROcodone-acetaminophen (NORCO/VICODIN) 5-325 MG tablet; Take 1 tablet by mouth 2 (two) times daily at 10 AM and 5 PM. -     HYDROcodone-acetaminophen (NORCO/VICODIN) 5-325 MG tablet; Take 1 tablet by mouth 2 (two) times daily at 10 AM and 5 PM.        ----------------------------------------------------------------------------------------------------------------------  Problem List Items Addressed This Visit    None    Visit Diagnoses    DDD (degenerative disc disease), lumbar    -  Primary   Relevant Medications   HYDROcodone-acetaminophen (NORCO/VICODIN) 5-325 MG tablet   Bilateral sciatica       Osteoarthritis of right hip, unspecified osteoarthritis type       Relevant Medications   HYDROcodone-acetaminophen (NORCO/VICODIN) 5-325  MG tablet   Facet arthritis of lumbosacral region ([place])       Relevant Medications   HYDROcodone-acetaminophen (NORCO/VICODIN) 5-325 MG tablet   Chronic pain syndrome       Chronic, continuous use of opioids       Primary osteoarthritis of left hip       Relevant Medications   HYDROcodone-acetaminophen (NORCO/VICODIN) 5-325 MG tablet   Chronic hand pain, unspecified laterality       Relevant Medications   HYDROcodone-acetaminophen (NORCO/VICODIN) 5-325 MG tablet   Primary osteoarthritis of both knees       Relevant Medications   HYDROcodone-acetaminophen (NORCO/VICODIN) 5-325 MG tablet        ----------------------------------------------------------------------------------------------------------------------  1. DDD (degenerative disc disease), lumbar We will continue having him work at weight loss and core strengthening.  Will defer any repeat injection at this time with return to clinic in 2 months  2. Bilateral sciatica As above  3. Osteoarthritis of right hip, unspecified osteoarthritis type As above  4. Facet arthritis of lumbosacral region ([place])   5. Chronic pain syndrome   6. Chronic, continuous use of opioids We will refill his medications for December 10 and January 9 of 2019.  We reviewed the [facility] practitioner database information and it is appropriate.  7. Primary osteoarthritis of left hip   8. Chronic hand pain, unspecified laterality   9. Primary osteoarthritis of both knees     ----------------------------------------------------------------------------------------------------------------------  I am having Nicholas [name] "Tommy" maintain his oxymetazoline, B-12, ferrous sulfate, hyoscyamine, Fish Oil, acetaminophen, ascorbic acid, Cranberry, MULTI-VITAMINS, gabapentin, QUEtiapine Fumarate, simvastatin, solifenacin, venlafaxine XR, ELIQUIS, LORazepam, Magnesium, and HYDROcodone-acetaminophen.   Meds ordered this encounter  Medications   . DISCONTD: HYDROcodone-acetaminophen (NORCO/VICODIN) 5-325 MG tablet    Sig: Take 1 tablet by mouth 2 (two) times daily at 10 AM and 5 PM.    Dispense:  60 tablet    Refill:  0    Do not fill until [id number]  . HYDROcodone-acetaminophen (NORCO/VICODIN) 5-325 MG tablet    Sig: Take 1 tablet by mouth 2 (two) times daily at 10 AM and 5 PM.    Dispense:  60 tablet    Refill:  0    Do not fill until [id number]     Medication List        Accurate as of 12/12/16 11:59 PM. Always use your most recent med list.          acetaminophen 500 MG tablet Commonly known as:  TYLENOL   ascorbic acid 250 MG tablet Commonly known as:  VITAMIN C   B-12 1000 MCG Caps   Cranberry 500 MG Caps   ELIQUIS 5 MG Tabs tablet Generic drug:  apixaban TAKE 1 TABLET (5 MG TOTAL) BY MOUTH 2 (TWO) TIMES DAILY.   ferrous sulfate 325 (65 FE) MG tablet   Fish Oil 1200 MG Caps   gabapentin 400 MG capsule Commonly known as:  NEURONTIN Take 1 capsule (400 mg total) by mouth 3 (three) times daily.   HYDROcodone-acetaminophen 5-325 MG tablet Commonly known as:  NORCO/VICODIN Take 1 tablet by mouth 2 (two) times daily at 10 AM and 5 PM.   hyoscyamine 0.125 MG tablet Commonly known as:  LEVSIN, ANASPAZ   LORazepam 1 MG tablet Commonly known as:  ATIVAN Take 1 tablet (1 mg total) by mouth at bedtime.   Magnesium 400 MG Caps   MULTI-VITAMINS Tabs   oxymetazoline 0.05 % nasal spray Commonly known as:  AFRIN   QUEtiapine Fumarate 150 MG 24 hr tablet Commonly known as:  SEROQUEL XR Take 1 tablet (150 mg total) by mouth at bedtime.   simvastatin 20 MG tablet Commonly known as:  ZOCOR Take 1 tablet (20 mg total) by mouth daily.   solifenacin 10 MG tablet Commonly known as:  VESICARE Take 1 tablet (10 mg total) by mouth daily.   venlafaxine XR 75 MG 24 hr capsule Commonly known as:  EFFEXOR XR Take 3 capsules (225 mg total) by mouth daily.       Where to Get Your Medications    You can get  these medications from any pharmacy   Bring a paper prescription for each of these medications  HYDROcodone-acetaminophen 5-325 MG tablet    ----------------------------------------------------------------------------------------------------------------------  Follow-up: Return in about 2 months (around 02/11/2017) for  evaluation, med refill.    [name], MD

## 2016-12-31 ENCOUNTER — Other Ambulatory Visit: Payer: Self-pay | Admitting: Family Medicine

## 2017-02-02 ENCOUNTER — Telehealth: Payer: Self-pay | Admitting: Internal Medicine

## 2017-02-02 NOTE — Telephone Encounter (Signed)
Patient has 2 appt for device check .  Wife wants to know if she needs both.  Can we cancel the Home remote check ?  Please advise

## 2017-02-02 NOTE — Telephone Encounter (Signed)
Spoke with Ms. Nicholas [name] and informed her that we could cancel the remote check as long as [name] came to the [place]. She stated that he would be there.

## 2017-02-20 ENCOUNTER — Encounter: Payer: Self-pay | Admitting: Internal Medicine

## 2017-02-20 ENCOUNTER — Ambulatory Visit (INDEPENDENT_AMBULATORY_CARE_PROVIDER_SITE_OTHER): Payer: Medicare Other | Admitting: Internal Medicine

## 2017-02-20 VITALS — BP 123/85 | HR 74 | Ht 71.0 in | Wt 293.5 lb

## 2017-02-20 DIAGNOSIS — I480 Paroxysmal atrial fibrillation: Secondary | ICD-10-CM | POA: Diagnosis not present

## 2017-02-20 DIAGNOSIS — Z950 Presence of cardiac pacemaker: Secondary | ICD-10-CM | POA: Diagnosis not present

## 2017-02-20 DIAGNOSIS — I441 Atrioventricular block, second degree: Secondary | ICD-10-CM

## 2017-02-20 DIAGNOSIS — I442 Atrioventricular block, complete: Secondary | ICD-10-CM | POA: Diagnosis not present

## 2017-02-20 MED ORDER — FLECAINIDE ACETATE 100 MG PO TABS: 100.0000 mg | ORAL_TABLET | Freq: Two times a day (BID) | ORAL | 3 refills | Status: DC

## 2017-02-20 NOTE — Patient Instructions (Addendum)
Medication Instructions: - Your physician has recommended you make the following change in your medication:  1) START flecainide 100 mg- take 1 tablet by mouth TWICE daily  Labwork: - none ordered  Procedures/Testing: - Your physician has requested that you have an exercise tolerance test (Thursday 2/14 or Tuesday 2/19) when Dr. Caryl Comes is in the office.   For your treadmill test: - you may have a light breakfast the morning of your test - please take all of your regular medications the morning of your procedure - no caffeine for 24 hours prior - no smoking for 4 hours prior - please wear comfortable clothes/ tennis or non-skid shoes   Follow-Up: - Remote monitoring is used to monitor your Pacemaker of ICD from home. This monitoring reduces the number of office visits required to check your device to one time per year. It allows [place] to keep an eye on the functioning of your device to ensure it is working properly. You are scheduled for a device check from home on 03/06/17. You may send your transmission at any time that day. If you have a wireless device, the transmission will be sent automatically. After your physician reviews your transmission, you will receive a postcard with your next transmission date.  - Your physician wants you to follow-up in: 6 months with Dr. Caryl Comes. You will receive a reminder letter in the mail two months in advance. If you don't receive a letter, please call our office to schedule the follow-up appointment.   Any Additional Special Instructions Will Be Listed Below (If Applicable). - please wear an abdominal binder during your waking hours    If you need a refill on your cardiac medications before your next appointment, please call your pharmacy.

## 2017-02-20 NOTE — Progress Notes (Signed)
Patient Care Team: [name], MD as PCP - General (Family Medicine) [name], MD (Specialist) Hollice Espy, MD as Consulting Physician (Urology) Tanda Rockers, MD as Consulting Physician (Pulmonary Disease) Dionisio David, MD as Consulting Physician (Cardiology)   HPI  Nicholas [name] is a 72 y.o. 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  Size tolerance  is much worse than when seen a few months ago time he was in sinus; he denies edema or chest pain.  No bleeding   He has obstructive sleep apnea by history but has not been interested in testing or therapy    DATE TEST    10/15    echo   EF 50 %   Mild RV dysfunction   11/17/     echo   EF 60 %    Nl RV function   11/17 Myoview EF 54 [facility])       His also had a history of pulmonary embolism with repeat venous Dopplers 1/16 demonstrated small clots. He has been managed with chronic apixaban      Past Medical History:  Diagnosis Date  . Anterior urethral stricture   . Anxiety   . Arthritis    a. knees, hips, hands;  b. 11/2013 s/p L TKA @ [facility].  . Bulging lumbar disc   . BXO (balanitis xerotica obliterans)   . 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 Depression   . Gross hematuria   . Hyperlipemia   . Hypertension    borderline  . Phimosis   . Pulmonary embolism [facility])     Past Surgical History:  Procedure Laterality Date  . BUNIONECTOMY Bilateral 01/06/2015   Procedure: BUNIONECTOMY;  Surgeon: [name], MD;  Location: [facility] ORS;  Service: Orthopedics;  Laterality: Bilateral;  . CARDIAC CATHETERIZATION  ~ 2005   "once"  . CATARACT EXTRACTION W/ INTRAOCULAR LENS  IMPLANT, BILATERAL Bilateral ~ 2010  .  COLONOSCOPY WITH PROPOFOL N/A 12/07/2015   Procedure: COLONOSCOPY WITH PROPOFOL;  Surgeon: Lollie Sails, MD;  Location: [facility] ENDOSCOPY;  Service: Endoscopy;  Laterality: N/A;  . EP IMPLANTABLE DEVICE N/A 02/16/2016   MDT dual chamber (His Bundle) pacemaker implanted by Dr Caryl Comes for intermittent complete heart block  . ESOPHAGOGASTRODUODENOSCOPY (EGD) WITH PROPOFOL N/A 12/07/2015   Procedure: ESOPHAGOGASTRODUODENOSCOPY (EGD) WITH PROPOFOL;  Surgeon: Lollie Sails, MD;  Location: [facility] ENDOSCOPY;  Service: Endoscopy;  Laterality: N/A;  . HAMMER TOE SURGERY Bilateral 01/06/2015   Procedure: HAMMER TOE CORRECTION;  Surgeon: [name], MD;  Location: [facility] ORS;  Service: Orthopedics;  Laterality: Bilateral;  . KNEE CARTILAGE SURGERY Left 1965   "football injury"  . Left Total Knee Arthroplasty     a. 11/2013 [facility].  Marland Kitchen PILONIDAL CYST EXCISION  1970's  . TOTAL HIP ARTHROPLASTY Right 2004  . TOTAL HIP ARTHROPLASTY Left 2006    Current Outpatient Medications  Medication Sig Dispense Refill  . acetaminophen (TYLENOL) 500 MG tablet Take 2,000 mg by mouth daily as needed for moderate pain or headache.    Marland Kitchen ascorbic acid (VITAMIN C) 250 MG tablet Take by mouth.    . Cranberry 500 MG CAPS Take by mouth.    Marland Kitchen  Cyanocobalamin (B-12) 1000 MCG CAPS Take 1,000 mcg by mouth daily.     Marland Kitchen ELIQUIS 5 MG TABS tablet TAKE 1 TABLET BY MOUTH TWICE A DAY 180 tablet 0  . ferrous sulfate 325 (65 FE) MG tablet Take 325 mg by mouth daily with breakfast.    . gabapentin (NEURONTIN) 400 MG capsule Take 1 capsule (400 mg total) by mouth 3 (three) times daily. 270 capsule 4  . HYDROcodone-acetaminophen (NORCO/VICODIN) 5-325 MG tablet Take 1 tablet by mouth 2 (two) times daily at 10 AM and 5 PM. 60 tablet 0  . hyoscyamine (LEVSIN, ANASPAZ) 0.125 MG tablet Take 0.125 mg by mouth every 6 (six) hours as needed for bladder spasms or cramping.     Marland Kitchen LORazepam (ATIVAN) 1 MG tablet Take 1 tablet (1 mg total) by mouth at  bedtime. 90 tablet 1  . Magnesium 400 MG CAPS Take 400 mg by mouth daily.    . Multiple Vitamin (MULTI-VITAMINS) TABS Take by mouth.    . Omega-3 Fatty Acids (FISH OIL) 1200 MG CAPS Take 1,200 mg by mouth daily.    Marland Kitchen oxymetazoline (AFRIN) 0.05 % nasal spray Place 2 sprays into both nostrils at bedtime as needed for congestion.    . QUEtiapine Fumarate (SEROQUEL XR) 150 MG 24 hr tablet Take 1 tablet (150 mg total) by mouth at bedtime. 90 tablet 4  . simvastatin (ZOCOR) 20 MG tablet Take 1 tablet (20 mg total) by mouth daily. 90 tablet 4  . solifenacin (VESICARE) 10 MG tablet Take 1 tablet (10 mg total) by mouth daily. 90 tablet 4  . venlafaxine XR (EFFEXOR XR) 75 MG 24 hr capsule Take 3 capsules (225 mg total) by mouth daily. 270 capsule 4   No current facility-administered medications for this visit.     No Known Allergies    Review of Systems negative except from HPI and PMH  Physical Exam BP 123/85 (BP Location: Left Arm, Patient Position: Sitting, Cuff Size: Large)   Pulse 74   Ht 5\' 11"  (1.803 m)   Wt 293 lb 8 oz (133.1 kg)   BMI 40.93 kg/m  Well developed and nourished in no acute distress HENT normal Neck supple with JVP-flat Clear Irregularly irregular rate and rhythm, no murmurs or gallops Abd-soft with active BS No Clubbing cyanosis tr edema Skin-warm and dry A & Oriented  Grossly normal sensory and motor function  ECG personally reviewed atrial fibrillation with intrinsic conduction or selective His bundle pacing and he is a great idea no looks from his ECG that he was selected there anyway that I think dropping down to touch is a great idea  Holter was personally reviewed as described above  Assessment and  Plan  His bundle pacing with selective. 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  Does not have coronary disease.  These have been paroxysmal.  We have discussed antiarrhythmic drugs and the potential for proarrhythmia.  Begin him on flecainide 100 mg twice daily.  His hips limit ambulation but we will try stress testing not withstanding following reversion to sinus rhythm.  We reviewed the importance of anticoagulation  Crosstalk and ventricular undersensing is a challenge.  In the context  of intermittent complete heart block crosstalk might result in inhibition of ventricular pacing.  With ventricular undersensing we run the risk of R on T pacing.  Hope will be that following reversion to sinus rhythm about 1 of these issues becomes much less of an issue.  We will plan on reassessment following restoration of sinus rhythm.  We have again reviewed the physiology of orthostatic intolerance is objective  -orthostatic hypotension.  I suggested the use of an abdominal binder  He has evidence of sleep disordered breathing.  We have discussed its relationship to atrial fibrillation.  He is not interested in sleep testing   More than 50% of 40 min was spent in counseling related to the above

## 2017-02-22 ENCOUNTER — Other Ambulatory Visit: Payer: Self-pay | Admitting: Family Medicine

## 2017-02-22 LAB — CUP PACEART INCLINIC DEVICE CHECK
Battery Voltage: 3 V
Brady Statistic AP VS Percent: 0.02 %
Brady Statistic RA Percent Paced: 29.68 %
Brady Statistic RV Percent Paced: 98.98 %
Date Time Interrogation Session: 20190129222658
Implantable Lead Model: 3830
Implantable Lead Model: 5076
Implantable Pulse Generator Implant Date: 20180124
Lead Channel Impedance Value: 361 Ohm
Lead Channel Impedance Value: 494 Ohm
Lead Channel Pacing Threshold Amplitude: 0.75 V
Lead Channel Sensing Intrinsic Amplitude: 4.125 mV
Lead Channel Setting Pacing Pulse Width: 1 ms
Lead Channel Setting Sensing Sensitivity: 0.9 mV
MDC IDC LEAD IMPLANT DT: 20180124
MDC IDC LEAD IMPLANT DT: 20180124
MDC IDC LEAD LOCATION: 753859
MDC IDC LEAD LOCATION: 753860
MDC IDC MSMT BATTERY REMAINING LONGEVITY: 79 mo
MDC IDC MSMT LEADCHNL RV IMPEDANCE VALUE: 342 Ohm
MDC IDC MSMT LEADCHNL RV IMPEDANCE VALUE: 513 Ohm
MDC IDC MSMT LEADCHNL RV PACING THRESHOLD PULSEWIDTH: 1 ms
MDC IDC MSMT LEADCHNL RV SENSING INTR AMPL: 1.4 mV
MDC IDC SET LEADCHNL RA PACING AMPLITUDE: 2 V
MDC IDC SET LEADCHNL RV PACING AMPLITUDE: 2 V
MDC IDC STAT BRADY AP VP PERCENT: 33.54 %
MDC IDC STAT BRADY AS VP PERCENT: 65.51 %
MDC IDC STAT BRADY AS VS PERCENT: 0.92 %

## 2017-02-27 ENCOUNTER — Other Ambulatory Visit: Payer: Self-pay

## 2017-02-27 ENCOUNTER — Other Ambulatory Visit: Payer: Self-pay | Admitting: Family Medicine

## 2017-02-27 ENCOUNTER — Encounter: Payer: Self-pay | Admitting: Anesthesiology

## 2017-02-27 ENCOUNTER — Ambulatory Visit: Payer: Medicare Other | Attending: Anesthesiology | Admitting: Anesthesiology

## 2017-02-27 VITALS — BP 140/87 | HR 66 | Temp 98.0°F | Ht 71.0 in | Wt 293.0 lb

## 2017-02-27 DIAGNOSIS — M25559 Pain in unspecified hip: Secondary | ICD-10-CM | POA: Insufficient documentation

## 2017-02-27 DIAGNOSIS — M4697 Unspecified inflammatory spondylopathy, lumbosacral region: Secondary | ICD-10-CM

## 2017-02-27 DIAGNOSIS — G894 Chronic pain syndrome: Secondary | ICD-10-CM

## 2017-02-27 DIAGNOSIS — M1611 Unilateral primary osteoarthritis, right hip: Secondary | ICD-10-CM | POA: Diagnosis not present

## 2017-02-27 DIAGNOSIS — M79606 Pain in leg, unspecified: Secondary | ICD-10-CM | POA: Insufficient documentation

## 2017-02-27 DIAGNOSIS — F1190 Opioid use, unspecified, uncomplicated: Secondary | ICD-10-CM | POA: Diagnosis not present

## 2017-02-27 DIAGNOSIS — M5136 Other intervertebral disc degeneration, lumbar region: Secondary | ICD-10-CM | POA: Diagnosis not present

## 2017-02-27 DIAGNOSIS — M79642 Pain in left hand: Secondary | ICD-10-CM | POA: Insufficient documentation

## 2017-02-27 DIAGNOSIS — M79641 Pain in right hand: Secondary | ICD-10-CM | POA: Diagnosis not present

## 2017-02-27 DIAGNOSIS — M47817 Spondylosis without myelopathy or radiculopathy, lumbosacral region: Secondary | ICD-10-CM

## 2017-02-27 DIAGNOSIS — M1612 Unilateral primary osteoarthritis, left hip: Secondary | ICD-10-CM | POA: Diagnosis not present

## 2017-02-27 DIAGNOSIS — M545 Low back pain: Secondary | ICD-10-CM | POA: Insufficient documentation

## 2017-02-27 DIAGNOSIS — Z7901 Long term (current) use of anticoagulants: Secondary | ICD-10-CM | POA: Diagnosis not present

## 2017-02-27 DIAGNOSIS — M1288 Other specific arthropathies, not elsewhere classified, other specified site: Secondary | ICD-10-CM | POA: Diagnosis not present

## 2017-02-27 DIAGNOSIS — Z5181 Encounter for therapeutic drug level monitoring: Secondary | ICD-10-CM | POA: Insufficient documentation

## 2017-02-27 DIAGNOSIS — Z79891 Long term (current) use of opiate analgesic: Secondary | ICD-10-CM | POA: Insufficient documentation

## 2017-02-27 DIAGNOSIS — M5431 Sciatica, right side: Secondary | ICD-10-CM

## 2017-02-27 DIAGNOSIS — M5116 Intervertebral disc disorders with radiculopathy, lumbar region: Secondary | ICD-10-CM | POA: Diagnosis not present

## 2017-02-27 DIAGNOSIS — Z79899 Other long term (current) drug therapy: Secondary | ICD-10-CM | POA: Diagnosis not present

## 2017-02-27 DIAGNOSIS — M5432 Sciatica, left side: Secondary | ICD-10-CM

## 2017-02-27 MED ORDER — HYDROCODONE-ACETAMINOPHEN 5-325 MG PO TABS: 1.0000 | ORAL_TABLET | Freq: Two times a day (BID) | ORAL | 0 refills | Status: DC

## 2017-02-27 NOTE — Progress Notes (Deleted)
Safety precautions to be maintained throughout the outpatient stay will include: orient to surroundings, keep bed in low position, maintain call bell within reach at all times, provide assistance with transfer out of bed and ambulation.  

## 2017-02-27 NOTE — Patient Instructions (Signed)
You were given 2 prescriptions for Hydrocodone. 

## 2017-02-27 NOTE — Progress Notes (Signed)
Nursing Pain Medication Assessment:  Safety precautions to be maintained throughout the outpatient stay will include: orient to surroundings, keep bed in low position, maintain call bell within reach at all times, provide assistance with transfer out of bed and ambulation.  Medication Inspection Compliance: Pill count conducted under aseptic conditions, in front of the patient. Neither the pills nor the bottle was removed from the patient's sight at any time. Once count was completed pills were immediately returned to the patient in their original bottle.  Medication: See above Pill/Patch Count: 9 of 60 pills remain Pill/Patch Appearance: Markings consistent with prescribed medication Bottle Appearance: Standard pharmacy container. Clearly labeled. Filled Date: 1 / 7 / 2019 Last Medication intake:  Today

## 2017-02-28 NOTE — Progress Notes (Signed)
Subjective:  Patient ID: Nicholas [name], male    DOB: 11-12-1945  Age: 72 y.o. MRN: [id number]  CC: Hand Pain (bilateral)   Procedure: None  HPI [name] presents for reevaluation.  He has been doing well since his last visit.  His back pain and leg pain is under better control than previous.  He is taking his medication sparingly.  He continues to use his Vicodin twice a day as needed for low back pain and this is working well for him.  We have reviewed the narcotic assessment sheet and based on findings he continues to derive good functional lifestyle improvement with his medications with no reported problems.  He denies any diverting or illicit use.  The quality characteristic and distribution of his bilateral hand pain and low back pain are without any significant change.  Otherwise he is in his usual state of health at this point.  Outpatient Medications Prior to Visit  Medication Sig Dispense Refill  . acetaminophen (TYLENOL) 500 MG tablet Take 2,000 mg by mouth daily as needed for moderate pain or headache.    Marland Kitchen ascorbic acid (VITAMIN C) 250 MG tablet Take by mouth.    . Cranberry 500 MG CAPS Take by mouth.    . Cyanocobalamin (B-12) 1000 MCG CAPS Take 1,000 mcg by mouth daily.     Marland Kitchen ELIQUIS 5 MG TABS tablet TAKE 1 TABLET BY MOUTH TWICE A DAY 180 tablet 0  . ferrous sulfate 325 (65 FE) MG tablet Take 325 mg by mouth daily with breakfast.    . flecainide (TAMBOCOR) 100 MG tablet Take 1 tablet (100 mg total) by mouth 2 (two) times daily. 180 tablet 3  . gabapentin (NEURONTIN) 400 MG capsule Take 1 capsule (400 mg total) by mouth 3 (three) times daily. 270 capsule 4  . hyoscyamine (LEVSIN, ANASPAZ) 0.125 MG tablet Take 0.125 mg by mouth every 6 (six) hours as needed for bladder spasms or cramping.     Marland Kitchen LORazepam (ATIVAN) 1 MG tablet TAKE 1 TABLET BY MOUTH AT BEDTIME 90 tablet 1  . Magnesium 400 MG CAPS Take 400 mg by mouth daily.    . Multiple Vitamin (MULTI-VITAMINS) TABS Take by  mouth.    . Omega-3 Fatty Acids (FISH OIL) 1200 MG CAPS Take 1,200 mg by mouth daily.    Marland Kitchen oxymetazoline (AFRIN) 0.05 % nasal spray Place 2 sprays into both nostrils at bedtime as needed for congestion.    . QUEtiapine Fumarate (SEROQUEL XR) 150 MG 24 hr tablet Take 1 tablet (150 mg total) by mouth at bedtime. 90 tablet 4  . simvastatin (ZOCOR) 20 MG tablet Take 1 tablet (20 mg total) by mouth daily. 90 tablet 4  . solifenacin (VESICARE) 10 MG tablet Take 1 tablet (10 mg total) by mouth daily. 90 tablet 4  . venlafaxine XR (EFFEXOR XR) 75 MG 24 hr capsule Take 3 capsules (225 mg total) by mouth daily. 270 capsule 4  . HYDROcodone-acetaminophen (NORCO/VICODIN) 5-325 MG tablet Take 1 tablet by mouth 2 (two) times daily at 10 AM and 5 PM. 60 tablet 0   No facility-administered medications prior to visit.     Review of Systems CNS: No confusion or sedation Cardiac: No angina or palpitations GI: No abdominal pain or constipation Constitutional: No nausea vomiting fevers or chills  Objective:  BP 140/87   Pulse 66   Temp 98 F (36.7 C)   Ht 5\' 11"  (1.803 m)   Wt 293 lb (132.9  kg)   SpO2 97%   BMI 40.87 kg/m    BP Readings from Last 3 Encounters:  02/27/17 140/87  02/20/17 123/85  12/12/16 118/81     Wt Readings from Last 3 Encounters:  02/27/17 293 lb (132.9 kg)  02/20/17 293 lb 8 oz (133.1 kg)  12/12/16 284 lb (128.8 kg)     Physical Exam Pt is alert and oriented PERRL EOMI HEART IS RRR no murmur or rub LCTA no wheezing or rales MUSCULOSKELETAL reveals some mild paraspinous muscle tenderness in the lumbar region but no overt trigger points.  He ambulates with a mildly antalgic gait and his muscle tone and bulk is at baseline.  Labs  No results found for: HGBA1C Lab Results  Component Value Date   LDLCALC 94 03/23/2016   CREATININE 1.05 08/15/2016     -------------------------------------------------------------------------------------------------------------------- Lab Results  Component Value Date   WBC 5.2 03/23/2016   HGB 15.7 03/23/2016   HCT 46.3 03/23/2016   PLT 166 03/23/2016   GLUCOSE 141 (H) 08/15/2016   CHOL 181 08/15/2016   TRIG 107 08/15/2016   HDL 44 03/23/2016   LDLCALC 94 03/23/2016   ALT 25 08/15/2016   AST 39 (H) 08/15/2016   NA 142 08/15/2016   K 5.0 08/15/2016   CL 104 08/15/2016   CREATININE 1.05 08/15/2016   BUN 20 08/15/2016   CO2 24 08/15/2016   TSH 2.760 03/23/2016   INR 1.2 02/16/2014    --------------------------------------------------------------------------------------------------------------------- Dg C-arm 1-60 Min-no Report  Result Date: 10/19/2016 Fluoroscopy was utilized by the requesting physician.  No radiographic interpretation.     Assessment & Plan:   1.  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  His sciatica seems to have abated at this time.  I want him to continue with his current medication management as this seems to enable him to maintain a productive and functional lifestyle and continue to play golf.  Unfortunately he has failed conservative therapy and requires chronic opioid management for his pain relief.  Reviewed the [facility] practitioner database information and it is appropriate.  We will refill his medication for February 8 and March 10. 2.  Hip pain 3.  Chronic use of opioid medications. 4.  Facet arthropathy.  He is to return to clinic in 2 months for reevaluation at that time.      ----------------------------------------------------------------------------------------------------------------------  Problem List Items Addressed This Visit    None         ----------------------------------------------------------------------------------------------------------------------  There are no diagnoses linked to this encounter.   ----------------------------------------------------------------------------------------------------------------------  I am having Corbitt T. Loney "Tommy" maintain his oxymetazoline, B-12, ferrous sulfate, hyoscyamine, Fish Oil, acetaminophen, ascorbic acid, Cranberry, MULTI-VITAMINS, gabapentin, QUEtiapine Fumarate, simvastatin, solifenacin, venlafaxine XR, Magnesium, ELIQUIS, flecainide, LORazepam, and HYDROcodone-acetaminophen.   Meds ordered this encounter  Medications  . DISCONTD: HYDROcodone-acetaminophen (NORCO/VICODIN) 5-325 MG tablet    Sig: Take 1 tablet by mouth 2 (two) times daily at 10 AM and 5 PM.    Dispense:  60 tablet    Refill:  0    Do not fill until [id number]  . HYDROcodone-acetaminophen (NORCO/VICODIN) 5-325 MG tablet    Sig: Take 1 tablet by mouth 2 (two) times daily at 10 AM and 5 PM.    Dispense:  60 tablet    Refill:  0    Do not fill until [id number]   Patient's Medications  New Prescriptions  No medications on file  Previous Medications   ACETAMINOPHEN (TYLENOL) 500 MG TABLET    Take 2,000 mg by mouth daily as needed for moderate pain or headache.   ASCORBIC ACID (VITAMIN C) 250 MG TABLET    Take by mouth.   CRANBERRY 500 MG CAPS    Take by mouth.   CYANOCOBALAMIN (B-12) 1000 MCG CAPS    Take 1,000 mcg by mouth daily.    ELIQUIS 5 MG TABS TABLET    TAKE 1 TABLET BY MOUTH TWICE A DAY   FERROUS SULFATE 325 (65 FE) MG TABLET    Take 325 mg by mouth daily with breakfast.   FLECAINIDE (TAMBOCOR) 100 MG TABLET    Take 1 tablet (100 mg total) by mouth 2 (two) times daily.   GABAPENTIN (NEURONTIN) 400 MG CAPSULE    Take 1 capsule (400 mg total) by mouth 3 (three) times daily.   HYOSCYAMINE (LEVSIN, ANASPAZ) 0.125 MG TABLET    Take 0.125 mg by mouth every 6 (six) hours as needed for  bladder spasms or cramping.    LORAZEPAM (ATIVAN) 1 MG TABLET    TAKE 1 TABLET BY MOUTH AT BEDTIME   MAGNESIUM 400 MG CAPS    Take 400 mg by mouth daily.   MULTIPLE VITAMIN (MULTI-VITAMINS) TABS    Take by mouth.   OMEGA-3 FATTY ACIDS (FISH OIL) 1200 MG CAPS    Take 1,200 mg by mouth daily.   OXYMETAZOLINE (AFRIN) 0.05 % NASAL SPRAY    Place 2 sprays into both nostrils at bedtime as needed for congestion.   QUETIAPINE FUMARATE (SEROQUEL XR) 150 MG 24 HR TABLET    Take 1 tablet (150 mg total) by mouth at bedtime.   SIMVASTATIN (ZOCOR) 20 MG TABLET    Take 1 tablet (20 mg total) by mouth daily.   SOLIFENACIN (VESICARE) 10 MG TABLET    Take 1 tablet (10 mg total) by mouth daily.   VENLAFAXINE XR (EFFEXOR XR) 75 MG 24 HR CAPSULE    Take 3 capsules (225 mg total) by mouth daily.  Modified Medications   Modified Medication Previous Medication   HYDROCODONE-ACETAMINOPHEN (NORCO/VICODIN) 5-325 MG TABLET HYDROcodone-acetaminophen (NORCO/VICODIN) 5-325 MG tablet      Take 1 tablet by mouth 2 (two) times daily at 10 AM and 5 PM.    Take 1 tablet by mouth 2 (two) times daily at 10 AM and 5 PM.  Discontinued Medications   No medications on file   ----------------------------------------------------------------------------------------------------------------------  Follow-up: Return in about 2 months (around 04/27/2017) for evaluation, med refill.    [name], MD

## 2017-03-06 ENCOUNTER — Ambulatory Visit (INDEPENDENT_AMBULATORY_CARE_PROVIDER_SITE_OTHER): Payer: Medicare Other | Admitting: *Deleted

## 2017-03-06 DIAGNOSIS — I442 Atrioventricular block, complete: Secondary | ICD-10-CM

## 2017-03-06 NOTE — Progress Notes (Signed)
Remote pacemaker transmission.   

## 2017-03-07 ENCOUNTER — Encounter: Payer: Self-pay | Admitting: Cardiology

## 2017-03-12 ENCOUNTER — Telehealth: Payer: Self-pay | Admitting: *Deleted

## 2017-03-12 NOTE — Telephone Encounter (Signed)
Patient notified and verbalized understanding of instructions for GXT tomorrow and appointment date and time.

## 2017-03-13 ENCOUNTER — Ambulatory Visit (INDEPENDENT_AMBULATORY_CARE_PROVIDER_SITE_OTHER): Payer: Medicare Other

## 2017-03-13 DIAGNOSIS — I480 Paroxysmal atrial fibrillation: Secondary | ICD-10-CM | POA: Diagnosis not present

## 2017-03-15 LAB — CUP PACEART REMOTE DEVICE CHECK
Battery Remaining Longevity: 84 mo
Battery Voltage: 3.01 V
Brady Statistic AP VP Percent: 19.5 %
Brady Statistic RV Percent Paced: 99.53 %
Date Time Interrogation Session: 20190212201617
Implantable Lead Implant Date: 20180124
Implantable Lead Location: 753859
Implantable Pulse Generator Implant Date: 20180124
Lead Channel Impedance Value: 361 Ohm
Lead Channel Impedance Value: 551 Ohm
Lead Channel Pacing Threshold Amplitude: 0.875 V
Lead Channel Pacing Threshold Pulse Width: 0.4 ms
Lead Channel Pacing Threshold Pulse Width: 0.4 ms
Lead Channel Sensing Intrinsic Amplitude: 6.5 mV
Lead Channel Setting Pacing Amplitude: 2 V
Lead Channel Setting Pacing Amplitude: 2 V
Lead Channel Setting Sensing Sensitivity: 0.9 mV
MDC IDC LEAD IMPLANT DT: 20180124
MDC IDC LEAD LOCATION: 753860
MDC IDC MSMT LEADCHNL RA IMPEDANCE VALUE: 380 Ohm
MDC IDC MSMT LEADCHNL RA IMPEDANCE VALUE: 513 Ohm
MDC IDC MSMT LEADCHNL RA SENSING INTR AMPL: 6.5 mV
MDC IDC MSMT LEADCHNL RV PACING THRESHOLD AMPLITUDE: 0.75 V
MDC IDC MSMT LEADCHNL RV SENSING INTR AMPL: 1.375 mV
MDC IDC MSMT LEADCHNL RV SENSING INTR AMPL: 1.375 mV
MDC IDC SET LEADCHNL RV PACING PULSEWIDTH: 1 ms
MDC IDC STAT BRADY AP VS PERCENT: 0.01 %
MDC IDC STAT BRADY AS VP PERCENT: 80.06 %
MDC IDC STAT BRADY AS VS PERCENT: 0.43 %
MDC IDC STAT BRADY RA PERCENT PACED: 19.02 %

## 2017-03-16 ENCOUNTER — Other Ambulatory Visit: Payer: Self-pay | Admitting: Family Medicine

## 2017-03-20 ENCOUNTER — Other Ambulatory Visit: Payer: Self-pay | Admitting: Gastroenterology

## 2017-03-20 DIAGNOSIS — R1310 Dysphagia, unspecified: Secondary | ICD-10-CM | POA: Diagnosis not present

## 2017-03-20 DIAGNOSIS — K2100 Gastro-esophageal reflux disease with esophagitis, without bleeding: Secondary | ICD-10-CM

## 2017-03-21 ENCOUNTER — Telehealth: Payer: Self-pay

## 2017-03-21 NOTE — Telephone Encounter (Signed)
Will route to Pharm D for recommendations on anticoagulation.

## 2017-03-21 NOTE — Telephone Encounter (Signed)
   [facility] Pre-operative Risk Assessment    Request for surgical clearance:  1. What type of surgery is being performed?EGD  2. When is this surgery scheduled? 05/17/17  3. Are there any medications that need to be held prior to surgery and how long?Eiquis --- Office is requesting cardiac clearance with anticoagulation recommendation  4. Practice name and name of physician performing surgery? [facility]   Dr. Gustavo Lah  5. What is your office phone and fax number? Fax [phone] office contact [place]  6. Anesthesia type (None, local, MAC, general) ? Monitored for sedation   Tod Persia 03/21/2017, 8:36 AM  _________________________________________________________________   (provider comments below)

## 2017-03-23 NOTE — Telephone Encounter (Signed)
I called the patient at [place], his wife picked up the phone and says he is asleep. I called again at 6:34 PM, no answer, left message. Will need to reach out to the patient again on Monday. Also need to discuss with device clinic on Monday to see if anything need to be done for his PPM during the EGD as well.   Hilbert Corrigan PA Pager: [phone]

## 2017-03-23 NOTE — Telephone Encounter (Signed)
Pt takes Eliquis for history of PE and is not being managed by cardiology for this condition. Recommend that prescribing MD Dr Jeananne Rama make recommendation regarding periprocedural holding of Eliquis. Please forward clearance request to his office.

## 2017-03-26 NOTE — Telephone Encounter (Signed)
Per Dr. Caryl Comes patient can hold Eliquis for 3 dosage prior to procedure and resume ASAP.

## 2017-03-29 ENCOUNTER — Ambulatory Visit
Admission: RE | Admit: 2017-03-29 | Discharge: 2017-03-29 | Disposition: A | Discharge status: Home / Self Care | Payer: Medicare Other | Source: Ambulatory Visit | Attending: Gastroenterology | Admitting: Gastroenterology

## 2017-03-29 DIAGNOSIS — K219 Gastro-esophageal reflux disease without esophagitis: Secondary | ICD-10-CM | POA: Diagnosis not present

## 2017-03-29 DIAGNOSIS — K228 Other specified diseases of esophagus: Secondary | ICD-10-CM | POA: Insufficient documentation

## 2017-03-29 DIAGNOSIS — M2578 Osteophyte, vertebrae: Secondary | ICD-10-CM | POA: Diagnosis not present

## 2017-03-29 DIAGNOSIS — K449 Diaphragmatic hernia without obstruction or gangrene: Secondary | ICD-10-CM | POA: Diagnosis not present

## 2017-03-29 DIAGNOSIS — K2100 Gastro-esophageal reflux disease with esophagitis, without bleeding: Secondary | ICD-10-CM

## 2017-03-29 DIAGNOSIS — R1310 Dysphagia, unspecified: Secondary | ICD-10-CM

## 2017-04-05 ENCOUNTER — Ambulatory Visit: Payer: Medicare Other

## 2017-04-06 ENCOUNTER — Other Ambulatory Visit: Payer: Self-pay | Admitting: Family Medicine

## 2017-04-06 NOTE — Telephone Encounter (Signed)
  Eliquis refill Last OV: 11/16/15 Last Refill:01/01/17 #180 tab  Pharmacy:[facility] [address] PCP: Golden Pop MD

## 2017-04-06 NOTE — Telephone Encounter (Signed)
Routing to provider for refill. Patient last seen 11/15/16 and has f/up scheduled for 05/22/17.   Routing to Glass blower/designer as an example.

## 2017-04-11 ENCOUNTER — Ambulatory Visit (INDEPENDENT_AMBULATORY_CARE_PROVIDER_SITE_OTHER): Payer: Medicare Other

## 2017-04-11 VITALS — BP 136/82 | HR 82 | Temp 98.1°F | Resp 16 | Ht 71.0 in | Wt 295.6 lb

## 2017-04-11 DIAGNOSIS — Z0000 Encounter for general adult medical examination without abnormal findings: Secondary | ICD-10-CM | POA: Diagnosis not present

## 2017-04-11 NOTE — Progress Notes (Signed)
Subjective:   Nicholas [name] is a 72 y.o. male who presents for an Initial Medicare Annual Wellness Visit.  Review of Systems   Cardiac Risk Factors include: hypertension;advanced age (>48men, >41 women);male gender;obesity (BMI >30kg/m2);dyslipidemia    Objective:    Today's Vitals   04/11/17 0808  BP: 136/82  Pulse: 82  Resp: 16  Temp: 98.1 F (36.7 C)  TempSrc: Temporal  Weight: 295 lb 9.6 oz (134.1 kg)  Height: 5\' 11"  (1.803 m)   Body mass index is 41.23 kg/m.  Advanced Directives 04/11/2017 10/19/2016 09/26/2016 06/29/2016 05/12/2016 03/21/2016 02/16/2016  Does Patient Have a Medical Advance Directive? No No No No No No No  Type of Advance Directive - - - - - - -  Would patient like information on creating a medical advance directive? Yes (MAU/Ambulatory/Procedural Areas - Information given) - No - Patient declined - No - Patient declined - Yes (MAU/Ambulatory/Procedural Areas - Information given)  Pre-existing out of facility DNR order (yellow form or pink MOST form) - - - - - - -    Current Medications (verified) Outpatient Encounter Medications as of 04/11/2017  Medication Sig  . acetaminophen (TYLENOL) 500 MG tablet Take 2,000 mg by mouth daily as needed for moderate pain or headache.  Marland Kitchen ascorbic acid (VITAMIN C) 250 MG tablet Take by mouth.  . Cranberry 500 MG CAPS Take by mouth.  . Cyanocobalamin (B-12) 1000 MCG CAPS Take 1,000 mcg by mouth daily.   Marland Kitchen ELIQUIS 5 MG TABS tablet TAKE 1 TABLET BY MOUTH TWICE A DAY  . ferrous sulfate 325 (65 FE) MG tablet Take 325 mg by mouth daily with breakfast.  . flecainide (TAMBOCOR) 100 MG tablet Take 1 tablet (100 mg total) by mouth 2 (two) times daily.  Marland Kitchen gabapentin (NEURONTIN) 400 MG capsule Take 1 capsule (400 mg total) by mouth 3 (three) times daily.  Marland Kitchen HYDROcodone-acetaminophen (NORCO/VICODIN) 5-325 MG tablet Take 1 tablet by mouth 2 (two) times daily at 10 AM and 5 PM.  . hyoscyamine (LEVSIN, ANASPAZ) 0.125 MG tablet Take 0.125  mg by mouth every 6 (six) hours as needed for bladder spasms or cramping.   Marland Kitchen LORazepam (ATIVAN) 1 MG tablet TAKE 1 TABLET BY MOUTH AT BEDTIME  . Magnesium 400 MG CAPS Take 400 mg by mouth daily.  . Multiple Vitamin (MULTI-VITAMINS) TABS Take by mouth.  . Omega-3 Fatty Acids (FISH OIL) 1200 MG CAPS Take 1,200 mg by mouth daily.  Marland Kitchen oxymetazoline (AFRIN) 0.05 % nasal spray Place 2 sprays into both nostrils at bedtime as needed for congestion.  . pantoprazole (PROTONIX) 40 MG tablet TAKE 1 TABLET (40 MG TOTAL) BY MOUTH ONCE DAILY TAKE 30 MINS BEFORE MEAL  . QUEtiapine Fumarate (SEROQUEL XR) 150 MG 24 hr tablet Take 1 tablet (150 mg total) by mouth at bedtime.  . simvastatin (ZOCOR) 20 MG tablet Take 1 tablet (20 mg total) by mouth daily.  . solifenacin (VESICARE) 10 MG tablet Take 1 tablet (10 mg total) by mouth daily.  Marland Kitchen venlafaxine XR (EFFEXOR XR) 75 MG 24 hr capsule Take 3 capsules (225 mg total) by mouth daily.  . VESICARE 10 MG tablet TAKE 1 TABLET BY MOUTH EVERY DAY   No facility-administered encounter medications on file as of 04/11/2017.     Allergies (verified) Patient has no known allergies.   History: Past Medical History:  Diagnosis Date  . Anterior urethral stricture   . Anxiety   . Arthritis    a. knees, hips,  hands;  b. 11/2013 s/p L TKA @ [place].  . Bulging lumbar disc   . BXO (balanitis xerotica obliterans)   . 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 Depression   . Gross hematuria   . Hyperlipemia   . Hypertension    borderline  . Phimosis   . Pulmonary embolism [facility])    Past Surgical History:  Procedure Laterality Date  . BUNIONECTOMY Bilateral 01/06/2015   Procedure: BUNIONECTOMY;  Surgeon: [name], MD;  Location: [facility] ORS;  Service: Orthopedics;  Laterality: Bilateral;  . CARDIAC CATHETERIZATION  ~ 2005   "once"  . CATARACT EXTRACTION W/ INTRAOCULAR LENS  IMPLANT, BILATERAL Bilateral ~ 2010  .  COLONOSCOPY WITH PROPOFOL N/A 12/07/2015   Procedure: COLONOSCOPY WITH PROPOFOL;  Surgeon: Lollie Sails, MD;  Location: [facility] ENDOSCOPY;  Service: Endoscopy;  Laterality: N/A;  . EP IMPLANTABLE DEVICE N/A 02/16/2016   MDT dual chamber (His Bundle) pacemaker implanted by Dr Caryl Comes for intermittent complete heart block  . ESOPHAGOGASTRODUODENOSCOPY (EGD) WITH PROPOFOL N/A 12/07/2015   Procedure: ESOPHAGOGASTRODUODENOSCOPY (EGD) WITH PROPOFOL;  Surgeon: Lollie Sails, MD;  Location: [facility] ENDOSCOPY;  Service: Endoscopy;  Laterality: N/A;  . HAMMER TOE SURGERY Bilateral 01/06/2015   Procedure: HAMMER TOE CORRECTION;  Surgeon: [name], MD;  Location: [facility] ORS;  Service: Orthopedics;  Laterality: Bilateral;  . KNEE CARTILAGE SURGERY Left 1965   "football injury"  . Left Total Knee Arthroplasty     a. 11/2013 [facility].  Marland Kitchen PILONIDAL CYST EXCISION  1970's  . TOTAL HIP ARTHROPLASTY Right 2004  . TOTAL HIP ARTHROPLASTY Left 2006   Family History  Problem Relation Age of Onset  . Stroke Father        deceased  . Diabetes Father   . Hypertension Father   . Alcoholism Mother        died in her 40's.  . Cancer Mother   . Cancer Sister   . Prostate cancer Paternal Grandfather    Social History   Socioeconomic History  . Marital status: Married    Spouse name: None  . Number of children: None  . Years of education: None  . Highest education level: None  Social Needs  . Financial resource strain: Not hard at all  . Food insecurity - worry: Never true  . Food insecurity - inability: Never true  . Transportation needs - medical: No  . Transportation needs - non-medical: No  Occupational History  . None  Tobacco Use  . Smoking status: Never Smoker  . Smokeless tobacco: Never Used  Substance and Sexual Activity  . Alcohol use: Yes    Alcohol/week: 0.0 oz    Comment: 2 DRINKS/MONTH  . Drug use: No  . Sexual activity: Not Currently  Other Topics Concern  . None  Social History  Narrative   Lives in [place] with wife.  Does not routinely exercise.  Activity severely limited by bilateral knee pain.   Tobacco Counseling Counseling given: Not Answered   Clinical Intake:  Pre-visit preparation completed: Yes  Pain : No/denies pain     Nutritional Status: BMI > 30  Obese Nutritional Risks: None Diabetes: No  How often do you need to have someone help you when you read instructions, pamphlets, or other written materials from your doctor or pharmacy?: 1 - Never What is the last grade level you completed in school?: 12th grade  Interpreter Needed?: No  Information entered  by :: Iriel Nason,LPN   Activities of Daily Living In your present state of health, do you have any difficulty performing the following activities: 04/11/2017  Hearing? N  Vision? N  Difficulty concentrating or making decisions? Y  Walking or climbing stairs? N  Dressing or bathing? N  Doing errands, shopping? N  Preparing Food and eating ? N  Using the Toilet? N  In the past six months, have you accidently leaked urine? N  Do you have problems with loss of bowel control? N  Managing your Medications? N  Managing your Finances? N  Housekeeping or managing your Housekeeping? N  Some recent data might be hidden     Immunizations and Health Maintenance Immunization History  Administered Date(s) Administered  . Influenza Split 11/13/2013  . Influenza Whole 11/09/2015  . Influenza, High Dose Seasonal PF 11/16/2015  . Influenza,inj,Quad PF,6+ Mos 10/20/2014  . Influenza-Unspecified 09/23/2012  . Pneumococcal Conjugate-13 04/28/2014  . Pneumococcal Polysaccharide-23 04/10/2013  . Pneumococcal-Unspecified 10/01/2008  . Td 01/05/2005   There are no preventive care reminders to display for this patient.  Patient Care Team: [name], MD as PCP - General (Family Medicine) [name], MD (Specialist) Hollice Espy, MD as Consulting Physician (Urology) Tanda Rockers,  MD as Consulting Physician (Pulmonary Disease) Dionisio David, MD as Consulting Physician (Cardiology)  Indicate any recent Medical Services you may have received from other than [facility] providers in the past year (date may be approximate).    Assessment:   This is a routine wellness examination for [facility].  Hearing/Vision screen Vision Screening Comments: Sees Dr.Woodard annually   Dietary issues and exercise activities discussed: Current Exercise Habits: The patient does not participate in regular exercise at present, Exercise limited by: None identified  Goals    . DIET - INCREASE WATER INTAKE     recommend drinking at least 6-8 glasses of water a day       Depression Screen PHQ 2/9 Scores 04/11/2017 02/27/2017 12/12/2016 10/19/2016  PHQ - 2 Score 4 0 0 0  PHQ- 9 Score 15 - - -  Exception Documentation - Patient refusal - -    Fall Risk Fall Risk  04/11/2017 02/27/2017 12/12/2016 10/19/2016 09/29/2016  Falls in the past year? Yes No No No No  Number falls in past yr: 2 or more - - - -  Comment - - - - -  Injury with Fall? No - - - -  Follow up Falls prevention discussed - - - -    Is the patient's home free of loose throw rugs in walkways, pet beds, electrical cords, etc?   yes      Grab bars in the bathroom? yes      Handrails on the stairs?   yes      Adequate lighting?   yes  Timed Get Up and Go performed: Completed in 8 seconds with no use of assistive devices, steady gait. No intervention needed at this time.   Cognitive Function:     6CIT Screen 04/11/2017  What Year? 0 points  What month? 0 points  What time? 0 points  Count back from 20 0 points  Months in reverse 0 points  Repeat phrase 0 points  Total Score 0    Screening Tests Health Maintenance  Topic Date Due  . TETANUS/TDAP  04/29/2023  . COLONOSCOPY  12/06/2025  . INFLUENZA VACCINE  Completed  . Hepatitis C Screening  Completed  . PNA vac Low Risk Adult  Completed    Qualifies for Shingles Vaccine?  Yes, discussed shingrix vaccine   Cancer Screenings: Lung: Low Dose CT Chest recommended if Age 36-80 years, 30 pack-year currently smoking OR have quit w/in 15years. Patient does not qualify. Colorectal: completed 12/07/2015  Additional Screenings:  Hepatitis B/HIV/Syphillis: not indicated  Hepatitis C Screening: completed 05/11/2015      Plan:    I have personally reviewed and addressed the Medicare Annual Wellness questionnaire and have noted the following in the patient's chart:  A. Medical and social history B. Use of alcohol, tobacco or illicit drugs  C. Current medications and supplements D. Functional ability and status E.  Nutritional status F.  Physical activity G. Advance directives H. List of other physicians I.  Hospitalizations, surgeries, and ER visits in previous 12 months J.  [place] such as hearing and vision if needed, cognitive and depression L. Referrals and appointments   In addition, I have reviewed and discussed with patient certain preventive protocols, quality metrics, and best practice recommendations. A written personalized care plan for preventive services as well as general preventive health recommendations were provided to patient.   Signed,  Tyler Aas, LPN Nurse Health Advisor   Nurse Notes:none

## 2017-04-11 NOTE — Patient Instructions (Signed)
[name] , Thank you for taking time to come for your Medicare Wellness Visit. I appreciate your ongoing commitment to your health goals. Please review the following plan we discussed and let me know if I can assist you in the future.   Screening recommendations/referrals: Colonoscopy: completed 12/07/2015 Recommended yearly ophthalmology/optometry visit for glaucoma screening and checkup Recommended yearly dental visit for hygiene and checkup  Vaccinations: Influenza vaccine: up to date  Pneumococcal vaccine: up to date Tdap vaccine: up to date Shingles vaccine: due, check with your insurance company for coverage     Advanced directives:Advance directive discussed with you today. I have provided a copy for you to complete at home and have notarized. Once this is complete please bring a copy in to our office so we can scan it into your chart.  Conditions/risks identified: recommend drinking at least 6-8 glasses of water a day   Next appointment: Follow up on 05/22/2017 at 8:00am with Dr.Crissman. Follow up in one year for your annual wellness exam.   Preventive Care 65 Years and Older, Male Preventive care refers to lifestyle choices and visits with your health care provider that can promote health and wellness. What does preventive care include?  A yearly physical exam. This is also called an annual well check.  Dental exams once or twice a year.  Routine eye exams. Ask your health care provider how often you should have your eyes checked.  Personal lifestyle choices, including:  Daily care of your teeth and gums.  Regular physical activity.  Eating a healthy diet.  Avoiding tobacco and drug use.  Limiting alcohol use.  Practicing safe sex.  Taking low doses of aspirin every day.  Taking vitamin and mineral supplements as recommended by your health care provider. What happens during an annual well check? The services and screenings done by your health care provider  during your annual well check will depend on your age, overall health, lifestyle risk factors, and family history of disease. Counseling  Your health care provider may ask you questions about your:  Alcohol use.  Tobacco use.  Drug use.  Emotional well-being.  Home and relationship well-being.  Sexual activity.  Eating habits.  History of falls.  Memory and ability to understand (cognition).  Work and work Statistician. Screening  You may have the following tests or measurements:  Height, weight, and BMI.  Blood pressure.  Lipid and cholesterol levels. These may be checked every 5 years, or more frequently if you are over 49 years old.  Skin check.  Lung cancer screening. You may have this screening every year starting at age 51 if you have a 30-pack-year history of smoking and currently smoke or have quit within the past 15 years.  Fecal occult blood test (FOBT) of the stool. You may have this test every year starting at age 60.  Flexible sigmoidoscopy or colonoscopy. You may have a sigmoidoscopy every 5 years or a colonoscopy every 10 years starting at age 22.  Prostate cancer screening. Recommendations will vary depending on your family history and other risks.  Hepatitis C blood test.  Hepatitis B blood test.  Sexually transmitted disease (STD) testing.  Diabetes screening. This is done by checking your blood sugar (glucose) after you have not eaten for a while (fasting). You may have this done every 1-3 years.  Abdominal aortic aneurysm (AAA) screening. You may need this if you are a current or former smoker.  Osteoporosis. You may be screened starting at age 60  if you are at high risk. Talk with your health care provider about your test results, treatment options, and if necessary, the need for more tests. Vaccines  Your health care provider may recommend certain vaccines, such as:  Influenza vaccine. This is recommended every year.  Tetanus,  diphtheria, and acellular pertussis (Tdap, Td) vaccine. You may need a Td booster every 10 years.  Zoster vaccine. You may need this after age 63.  Pneumococcal 13-valent conjugate (PCV13) vaccine. One dose is recommended after age 5.  Pneumococcal polysaccharide (PPSV23) vaccine. One dose is recommended after age 34. Talk to your health care provider about which screenings and vaccines you need and how often you need them. This information is not intended to replace advice given to you by your health care provider. Make sure you discuss any questions you have with your health care provider. Document Released: 02/05/2015 Document Revised: 09/29/2015 Document Reviewed: 11/10/2014 Elsevier Interactive Patient Education  2017 [place] Prevention in the Home Falls can cause injuries. They can happen to people of all ages. There are many things you can do to make your home safe and to help prevent falls. What can I do on the outside of my home?  Regularly fix the edges of walkways and driveways and fix any cracks.  Remove anything that might make you trip as you walk through a door, such as a raised step or threshold.  Trim any bushes or trees on the path to your home.  Use bright outdoor lighting.  Clear any walking paths of anything that might make someone trip, such as rocks or tools.  Regularly check to see if handrails are loose or broken. Make sure that both sides of any steps have handrails.  Any raised decks and porches should have guardrails on the edges.  Have any leaves, snow, or ice cleared regularly.  Use sand or salt on walking paths during winter.  Clean up any spills in your garage right away. This includes oil or grease spills. What can I do in the bathroom?  Use night lights.  Install grab bars by the toilet and in the tub and shower. Do not use towel bars as grab bars.  Use non-skid mats or decals in the tub or shower.  If you need to sit down in  the shower, use a plastic, non-slip stool.  Keep the floor dry. Clean up any water that spills on the floor as soon as it happens.  Remove soap buildup in the tub or shower regularly.  Attach bath mats securely with double-sided non-slip rug tape.  Do not have throw rugs and other things on the floor that can make you trip. What can I do in the bedroom?  Use night lights.  Make sure that you have a light by your bed that is easy to reach.  Do not use any sheets or blankets that are too big for your bed. They should not hang down onto the floor.  Have a firm chair that has side arms. You can use this for support while you get dressed.  Do not have throw rugs and other things on the floor that can make you trip. What can I do in the kitchen?  Clean up any spills right away.  Avoid walking on wet floors.  Keep items that you use a lot in easy-to-reach places.  If you need to reach something above you, use a strong step stool that has a grab bar.  Keep electrical  cords out of the way.  Do not use floor polish or wax that makes floors slippery. If you must use wax, use non-skid floor wax.  Do not have throw rugs and other things on the floor that can make you trip. What can I do with my stairs?  Do not leave any items on the stairs.  Make sure that there are handrails on both sides of the stairs and use them. Fix handrails that are broken or loose. Make sure that handrails are as long as the stairways.  Check any carpeting to make sure that it is firmly attached to the stairs. Fix any carpet that is loose or worn.  Avoid having throw rugs at the top or bottom of the stairs. If you do have throw rugs, attach them to the floor with carpet tape.  Make sure that you have a light switch at the top of the stairs and the bottom of the stairs. If you do not have them, ask someone to add them for you. What else can I do to help prevent falls?  Wear shoes that:  Do not have high  heels.  Have rubber bottoms.  Are comfortable and fit you well.  Are closed at the toe. Do not wear sandals.  If you use a stepladder:  Make sure that it is fully opened. Do not climb a closed stepladder.  Make sure that both sides of the stepladder are locked into place.  Ask someone to hold it for you, if possible.  Clearly mark and make sure that you can see:  Any grab bars or handrails.  First and last steps.  Where the edge of each step is.  Use tools that help you move around (mobility aids) if they are needed. These include:  Canes.  Walkers.  Scooters.  Crutches.  Turn on the lights when you go into a dark area. Replace any light bulbs as soon as they burn out.  Set up your furniture so you have a clear path. Avoid moving your furniture around.  If any of your floors are uneven, fix them.  If there are any pets around you, be aware of where they are.  Review your medicines with your doctor. Some medicines can make you feel dizzy. This can increase your chance of falling. Ask your doctor what other things that you can do to help prevent falls. This information is not intended to replace advice given to you by your health care provider. Make sure you discuss any questions you have with your health care provider. Document Released: 11/05/2008 Document Revised: 06/17/2015 Document Reviewed: 02/13/2014 Elsevier Interactive Patient Education  2017 [employer].

## 2017-04-12 ENCOUNTER — Encounter: Payer: Medicare Other | Admitting: Internal Medicine

## 2017-04-16 ENCOUNTER — Other Ambulatory Visit: Payer: Self-pay | Admitting: Family Medicine

## 2017-04-17 ENCOUNTER — Ambulatory Visit (INDEPENDENT_AMBULATORY_CARE_PROVIDER_SITE_OTHER): Payer: Medicare Other | Admitting: Internal Medicine

## 2017-04-17 ENCOUNTER — Encounter: Payer: Self-pay | Admitting: Internal Medicine

## 2017-04-17 VITALS — BP 130/60 | HR 67 | Ht 71.0 in | Wt 298.0 lb

## 2017-04-17 DIAGNOSIS — Z950 Presence of cardiac pacemaker: Secondary | ICD-10-CM

## 2017-04-17 DIAGNOSIS — I442 Atrioventricular block, complete: Secondary | ICD-10-CM

## 2017-04-17 DIAGNOSIS — I480 Paroxysmal atrial fibrillation: Secondary | ICD-10-CM

## 2017-04-17 DIAGNOSIS — I440 Atrioventricular block, first degree: Secondary | ICD-10-CM

## 2017-04-17 NOTE — Progress Notes (Signed)
Patient Care Team: [name], MD as PCP - General (Family Medicine) [name], MD (Specialist) Hollice Espy, MD as Consulting Physician (Urology) Tanda Rockers, MD as Consulting Physician (Pulmonary Disease) Dionisio David, MD as Consulting Physician (Cardiology)   HPI  Nicholas [name] is a 72 y.o. 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  It was elected to start him on flecainide.   He has been significantly improved.  He is having no dizziness.  He has obstructive sleep apnea by history but has not been interested in testing or therapy    DATE TEST    10/15    echo   EF 50 %   Mild RV dysfunction   11/17/     echo   EF 60 %    Nl RV function   11/17 Myoview EF 72 [facility])    He has untreated sleep apnea   His also had a history of pulmonary embolism with repeat venous Dopplers 1/16 demonstrated small clots. He has been managed with chronic apixaban  DATE PR interval QRSduration Dose  3/19 228 126 (HIS pacing) 100       Past Medical History:  Diagnosis Date  . Anterior urethral stricture   . Anxiety   . Arthritis    a. knees, hips, hands;  b. 11/2013 s/p L TKA @ [facility].  . Bulging lumbar disc   . BXO (balanitis xerotica obliterans)   . 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 Depression   . Gross hematuria   . Hyperlipemia   . Hypertension    borderline  . Phimosis   . Pulmonary embolism [facility])     Past Surgical History:  Procedure Laterality Date  . BUNIONECTOMY Bilateral 01/06/2015   Procedure: BUNIONECTOMY;  Surgeon: [name], MD;  Location: [facility] ORS;  Service: Orthopedics;  Laterality: Bilateral;  . CARDIAC CATHETERIZATION  ~ 2005   "once"  .  CATARACT EXTRACTION W/ INTRAOCULAR LENS  IMPLANT, BILATERAL Bilateral ~ 2010  . COLONOSCOPY WITH PROPOFOL N/A 12/07/2015   Procedure: COLONOSCOPY WITH PROPOFOL;  Surgeon: Lollie Sails, MD;  Location: [facility] ENDOSCOPY;  Service: Endoscopy;  Laterality: N/A;  . EP IMPLANTABLE DEVICE N/A 02/16/2016   MDT dual chamber (His Bundle) pacemaker implanted by Dr Caryl Comes for intermittent complete heart block  . ESOPHAGOGASTRODUODENOSCOPY (EGD) WITH PROPOFOL N/A 12/07/2015   Procedure: ESOPHAGOGASTRODUODENOSCOPY (EGD) WITH PROPOFOL;  Surgeon: Lollie Sails, MD;  Location: [facility] ENDOSCOPY;  Service: Endoscopy;  Laterality: N/A;  . HAMMER TOE SURGERY Bilateral 01/06/2015   Procedure: HAMMER TOE CORRECTION;  Surgeon: [name], MD;  Location: [facility] ORS;  Service: Orthopedics;  Laterality: Bilateral;  . KNEE CARTILAGE SURGERY Left 1965   "football injury"  . Left Total Knee Arthroplasty     a. 11/2013 [facility].  Marland Kitchen PILONIDAL CYST EXCISION  1970's  . TOTAL HIP ARTHROPLASTY Right 2004  . TOTAL HIP ARTHROPLASTY Left 2006    Current Outpatient Medications  Medication Sig Dispense Refill  . acetaminophen (TYLENOL) 500 MG tablet Take 2,000 mg by mouth daily as needed for moderate pain or headache.    Marland Kitchen ascorbic acid (VITAMIN C) 250 MG tablet Take by mouth.    . Cranberry  500 MG CAPS Take by mouth.    . Cyanocobalamin (B-12) 1000 MCG CAPS Take 1,000 mcg by mouth daily.     Marland Kitchen ELIQUIS 5 MG TABS tablet TAKE 1 TABLET BY MOUTH TWICE A DAY 60 tablet 0  . ferrous sulfate 325 (65 FE) MG tablet Take 325 mg by mouth daily with breakfast.    . flecainide (TAMBOCOR) 100 MG tablet Take 1 tablet (100 mg total) by mouth 2 (two) times daily. 180 tablet 3  . gabapentin (NEURONTIN) 400 MG capsule Take 1 capsule (400 mg total) by mouth 3 (three) times daily. 270 capsule 4  . HYDROcodone-acetaminophen (NORCO/VICODIN) 5-325 MG tablet Take 1 tablet by mouth 2 (two) times daily at 10 AM and 5 PM. 60 tablet 0  . hyoscyamine (LEVSIN,  ANASPAZ) 0.125 MG tablet Take 0.125 mg by mouth every 6 (six) hours as needed for bladder spasms or cramping.     Marland Kitchen LORazepam (ATIVAN) 1 MG tablet TAKE 1 TABLET BY MOUTH AT BEDTIME 90 tablet 1  . Magnesium 400 MG CAPS Take 400 mg by mouth daily.    . Multiple Vitamin (MULTI-VITAMINS) TABS Take by mouth.    . Omega-3 Fatty Acids (FISH OIL) 1200 MG CAPS Take 1,200 mg by mouth daily.    Marland Kitchen oxymetazoline (AFRIN) 0.05 % nasal spray Place 2 sprays into both nostrils at bedtime as needed for congestion.    . pantoprazole (PROTONIX) 40 MG tablet TAKE 1 TABLET (40 MG TOTAL) BY MOUTH ONCE DAILY TAKE 30 MINS BEFORE MEAL  6  . QUEtiapine Fumarate (SEROQUEL XR) 150 MG 24 hr tablet Take 1 tablet (150 mg total) by mouth at bedtime. 90 tablet 4  . simvastatin (ZOCOR) 20 MG tablet Take 1 tablet (20 mg total) by mouth daily. 90 tablet 4  . solifenacin (VESICARE) 10 MG tablet Take 1 tablet (10 mg total) by mouth daily. 90 tablet 4  . venlafaxine XR (EFFEXOR XR) 75 MG 24 hr capsule Take 3 capsules (225 mg total) by mouth daily. 270 capsule 4  . VESICARE 10 MG tablet TAKE 1 TABLET BY MOUTH EVERY DAY 90 tablet 0   No current facility-administered medications for this visit.     No Known Allergies    Review of Systems negative except from HPI and PMH  Physical Exam BP 130/60 (BP Location: Left Arm, Patient Position: Sitting, Cuff Size: Normal)   Pulse 67   Ht 5\' 11"  (1.803 m)   Wt 298 lb (135.2 kg)   SpO2 (!) 89%   BMI 41.56 kg/m  Well developed and nourished in no acute distress HENT normal Neck supple with JVP-flat Clear Regular rate and rhythm, no murmurs or gallops Abd-soft with active BS No Clubbing cyanosis edema Skin-warm and dry A & Oriented  Grossly normal sensory and motor function   ECG P-synchronous/ AV  pacing with His pacing Assessment and  Plan  His bundle pacing with selective. 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  We will continue with current dosing.  No bleeding.  We reviewed the interactions between sleep apnea and atrial fibrillation.  I have encouraged him to seek therapy for his sleep apnea as his atrial fibrillation is very symptomatic and therapeutic benefits  will be attenuated by his untreated sleep apnea  We spent more than 50% of our >25 min visit in face to face counseling regarding the above

## 2017-04-17 NOTE — Patient Instructions (Signed)
Medication Instructions: - Your physician recommends that you continue on your current medications as directed. Please refer to the Current Medication list given to you today.  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 This monitoring reduces the number of office visits required to check your device to one time per year. It allows [place] to keep an eye on the functioning of your device to ensure it is working properly. You are scheduled for a device check from home on 06/05/17. You may send your transmission at any time that day. If you have a wireless device, the transmission will be sent automatically. After your physician reviews your transmission, you will receive a postcard with your next transmission date.  - Your physician wants you to follow-up in: 6 months with Dr. Caryl Comes. You will receive a reminder letter in the mail two months in advance. If you don't receive a letter, please call our office to schedule the follow-up appointment.   Any Additional Special Instructions Will Be Listed Below (If Applicable).     If you need a refill on your cardiac medications before your next appointment, please call your pharmacy.

## 2017-04-24 ENCOUNTER — Ambulatory Visit: Payer: Medicare Other | Attending: Anesthesiology | Admitting: Anesthesiology

## 2017-04-24 ENCOUNTER — Other Ambulatory Visit: Payer: Self-pay

## 2017-04-24 ENCOUNTER — Encounter: Payer: Self-pay | Admitting: Anesthesiology

## 2017-04-24 VITALS — BP 155/94 | HR 80 | Temp 98.3°F | Resp 20 | Ht 71.0 in | Wt 296.0 lb

## 2017-04-24 DIAGNOSIS — M47817 Spondylosis without myelopathy or radiculopathy, lumbosacral region: Secondary | ICD-10-CM | POA: Diagnosis not present

## 2017-04-24 DIAGNOSIS — M1611 Unilateral primary osteoarthritis, right hip: Secondary | ICD-10-CM | POA: Diagnosis not present

## 2017-04-24 DIAGNOSIS — M5431 Sciatica, right side: Secondary | ICD-10-CM | POA: Diagnosis not present

## 2017-04-24 DIAGNOSIS — M160 Bilateral primary osteoarthritis of hip: Secondary | ICD-10-CM | POA: Diagnosis not present

## 2017-04-24 DIAGNOSIS — M79642 Pain in left hand: Secondary | ICD-10-CM | POA: Insufficient documentation

## 2017-04-24 DIAGNOSIS — M170 Bilateral primary osteoarthritis of knee: Secondary | ICD-10-CM | POA: Insufficient documentation

## 2017-04-24 DIAGNOSIS — Z79899 Other long term (current) drug therapy: Secondary | ICD-10-CM | POA: Diagnosis not present

## 2017-04-24 DIAGNOSIS — Z79891 Long term (current) use of opiate analgesic: Secondary | ICD-10-CM | POA: Diagnosis not present

## 2017-04-24 DIAGNOSIS — M5136 Other intervertebral disc degeneration, lumbar region: Secondary | ICD-10-CM | POA: Diagnosis not present

## 2017-04-24 DIAGNOSIS — M5432 Sciatica, left side: Secondary | ICD-10-CM | POA: Insufficient documentation

## 2017-04-24 DIAGNOSIS — G8929 Other chronic pain: Secondary | ICD-10-CM | POA: Diagnosis not present

## 2017-04-24 DIAGNOSIS — T23009A Burn of unspecified degree of unspecified hand, unspecified site, initial encounter: Secondary | ICD-10-CM | POA: Insufficient documentation

## 2017-04-24 DIAGNOSIS — G894 Chronic pain syndrome: Secondary | ICD-10-CM | POA: Insufficient documentation

## 2017-04-24 DIAGNOSIS — X58XXXA Exposure to other specified factors, initial encounter: Secondary | ICD-10-CM | POA: Diagnosis not present

## 2017-04-24 DIAGNOSIS — M79643 Pain in unspecified hand: Secondary | ICD-10-CM

## 2017-04-24 DIAGNOSIS — M1612 Unilateral primary osteoarthritis, left hip: Secondary | ICD-10-CM

## 2017-04-24 DIAGNOSIS — M79641 Pain in right hand: Secondary | ICD-10-CM | POA: Diagnosis not present

## 2017-04-24 DIAGNOSIS — F1190 Opioid use, unspecified, uncomplicated: Secondary | ICD-10-CM

## 2017-04-24 MED ORDER — HYDROCODONE-ACETAMINOPHEN 5-325 MG PO TABS: 1.0000 | ORAL_TABLET | Freq: Two times a day (BID) | ORAL | 0 refills | Status: DC

## 2017-04-24 NOTE — Progress Notes (Signed)
Nursing Pain Medication Assessment:  Safety precautions to be maintained throughout the outpatient stay will include: orient to surroundings, keep bed in low position, maintain call bell within reach at all times, provide assistance with transfer out of bed and ambulation.  Medication Inspection Compliance: Pill count conducted under aseptic conditions, in front of the patient. Neither the pills nor the bottle was removed from the patient's sight at any time. Once count was completed pills were immediately returned to the patient in their original bottle.  Medication: See above Pill/Patch Count: 16 of 60 pills remain Pill/Patch Appearance: Markings consistent with prescribed medication Bottle Appearance: Standard pharmacy container. Clearly labeled. Filled Date: 3 / 10 / 2018 Last Medication intake:  Today

## 2017-04-25 NOTE — Progress Notes (Signed)
Subjective:  Patient ID: Nicholas [name], male    DOB: 11/29/1945  Age: 72 y.o. MRN: [id number]  CC: Hand Burn (pain in both hands)   Procedure: None  HPI Nicholas [name] presents for reevaluation.  He was last seen 2 months ago and he continues to have chronic low back pain of the same quality characteristic distribution.  No significant changes are reported at this time.  Otherwise she is in his usual state of health.  His bowel bladder function also has been at baseline.  He is taking his medications as prescribed and based on his narcotic assessment she continues to derive good functional lifestyle improvement with his chronic opioid therapy.  Outpatient Medications Prior to Visit  Medication Sig Dispense Refill  . acetaminophen (TYLENOL) 500 MG tablet Take 2,000 mg by mouth daily as needed for moderate pain or headache.    Marland Kitchen ascorbic acid (VITAMIN C) 250 MG tablet Take by mouth.    . Cranberry 500 MG CAPS Take by mouth.    . Cyanocobalamin (B-12) 1000 MCG CAPS Take 1,000 mcg by mouth daily.     Marland Kitchen ELIQUIS 5 MG TABS tablet TAKE 1 TABLET BY MOUTH TWICE A DAY 60 tablet 0  . ferrous sulfate 325 (65 FE) MG tablet Take 325 mg by mouth daily with breakfast.    . flecainide (TAMBOCOR) 100 MG tablet Take 1 tablet (100 mg total) by mouth 2 (two) times daily. 180 tablet 3  . gabapentin (NEURONTIN) 400 MG capsule Take 1 capsule (400 mg total) by mouth 3 (three) times daily. 270 capsule 4  . hyoscyamine (LEVSIN, ANASPAZ) 0.125 MG tablet Take 0.125 mg by mouth every 6 (six) hours as needed for bladder spasms or cramping.     Marland Kitchen LORazepam (ATIVAN) 1 MG tablet TAKE 1 TABLET BY MOUTH AT BEDTIME 90 tablet 1  . Magnesium 400 MG CAPS Take 400 mg by mouth daily.    . Multiple Vitamin (MULTI-VITAMINS) TABS Take by mouth.    . Omega-3 Fatty Acids (FISH OIL) 1200 MG CAPS Take 1,200 mg by mouth daily.    Marland Kitchen oxymetazoline (AFRIN) 0.05 % nasal spray Place 2 sprays into both nostrils at bedtime as needed for  congestion.    . pantoprazole (PROTONIX) 40 MG tablet TAKE 1 TABLET (40 MG TOTAL) BY MOUTH ONCE DAILY TAKE 30 MINS BEFORE MEAL  6  . QUEtiapine Fumarate (SEROQUEL XR) 150 MG 24 hr tablet Take 1 tablet (150 mg total) by mouth at bedtime. 90 tablet 4  . simvastatin (ZOCOR) 20 MG tablet Take 1 tablet (20 mg total) by mouth daily. 90 tablet 4  . solifenacin (VESICARE) 10 MG tablet Take 1 tablet (10 mg total) by mouth daily. 90 tablet 4  . venlafaxine XR (EFFEXOR XR) 75 MG 24 hr capsule Take 3 capsules (225 mg total) by mouth daily. 270 capsule 4  . VESICARE 10 MG tablet TAKE 1 TABLET BY MOUTH EVERY DAY 90 tablet 0  . HYDROcodone-acetaminophen (NORCO/VICODIN) 5-325 MG tablet Take 1 tablet by mouth 2 (two) times daily at 10 AM and 5 PM. 60 tablet 0   No facility-administered medications prior to visit.     Review of Systems CNS: No confusion or sedation Cardiac: No angina or palpitations GI: No abdominal pain or constipation Constitutional: No nausea vomiting fevers or chills  Objective:  BP (!) 155/94   Pulse 80   Temp 98.3 F (36.8 C)   Resp 20   Ht 5\' 11"  (1.803 m)  Wt 296 lb (134.3 kg)   SpO2 95%   BMI 41.28 kg/m    BP Readings from Last 3 Encounters:  04/24/17 (!) 155/94  04/17/17 130/60  04/11/17 136/82     Wt Readings from Last 3 Encounters:  04/24/17 296 lb (134.3 kg)  04/17/17 298 lb (135.2 kg)  04/11/17 295 lb 9.6 oz (134.1 kg)     Physical Exam Pt is alert and oriented PERRL EOMI HEART IS RRR no murmur or rub LCTA no wheezing or rales MUSCULOSKELETAL reveals some paraspinous muscle tenderness but no overt trigger points.  He walks with an antalgic gait and his baseline muscle tone and bulk is preserved.  Labs  No results found for: HGBA1C Lab Results  Component Value Date   LDLCALC 94 03/23/2016   CREATININE 1.05 08/15/2016    -------------------------------------------------------------------------------------------------------------------- Lab  Results  Component Value Date   WBC 5.2 03/23/2016   HGB 15.7 03/23/2016   HCT 46.3 03/23/2016   PLT 166 03/23/2016   GLUCOSE 141 (H) 08/15/2016   CHOL 181 08/15/2016   TRIG 107 08/15/2016   HDL 44 03/23/2016   LDLCALC 94 03/23/2016   ALT 25 08/15/2016   AST 39 (H) 08/15/2016   NA 142 08/15/2016   K 5.0 08/15/2016   CL 104 08/15/2016   CREATININE 1.05 08/15/2016   BUN 20 08/15/2016   CO2 24 08/15/2016   TSH 2.760 03/23/2016   INR 1.2 02/16/2014    --------------------------------------------------------------------------------------------------------------------- Dg Esophagus  Result Date: 03/29/2017 CLINICAL DATA:  Intermittent esophageal obstructive symptoms requiring patient to throw up. History of esophageal dilation approximately 1 year ago. EXAM: ESOPHOGRAM / BARIUM SWALLOW / BARIUM TABLET STUDY TECHNIQUE: Combined double contrast and single contrast examination performed using effervescent crystals, thick barium liquid, and thin barium liquid. The patient was observed with fluoroscopy swallowing a 13 mm barium sulphate tablet. FLUOROSCOPY TIME:  Fluoroscopy Time:  1 minutes, 6 seconds Radiation Exposure Index (if provided by the fluoroscopic device): 2480 micro Gy per meter square Number of Acquired Spot Images: 11+ 1 video loop. COMPARISON:  Barium esophagram of September 28, 2015 FINDINGS: The patient ingested thick and thin barium and the gas-forming crystals without difficulty. The hypopharynx distended well. There was no laryngeal penetration of the barium. A mild impression was made upon the posterior aspect of the mid cervical esophagus due to an anterior endplate osteophyte at T0-6. This was a persistent area of narrowing on the video loop but was not obstructive to passage of the liquid barium or the barium tablet. No significant cricopharyngeus muscle impression was observed. The thoracic esophagus distended reasonably well. There were prominent tertiary contractions in the  mid and lower thoracic esophagus. There was a small non reducible reducible hiatal hernia. No gastroesophageal reflux was observed. There was narrowing at the junction of the is esophagus with the hiatal hernia which would not allow passage of the barium tablet. There was no evidence of ulceration nor of a mass. Survey views of the stomach and duodenal bulb were normal. IMPRESSION: Focal area of nonobstructing narrowing of the mid cervical esophagus at approximately the C5-6 level at least in part due to a prominent anterior endplate osteophyte. Mild changes of presbyesophagus. Short-segment focus of fixed narrowing at the GE junction just proximal to the small non reducible hiatal hernia. The barium tablet hung here but did not produce symptoms. Direct visualization of the esophagus and consideration for repeat dilation would be useful. Electronically Signed   By: David  Martinique M.D.   On:  03/29/2017 11:39     Assessment & Plan:   Eulice was seen today for hand burn.  Diagnoses and all orders for this visit:  Bilateral sciatica  DDD (degenerative disc disease), lumbar  Osteoarthritis of right hip, unspecified osteoarthritis type  Facet arthritis of lumbosacral region  Chronic pain syndrome  Chronic, continuous use of opioids -     ToxASSURE Select 13 (MW), Urine  Chronic hand pain, unspecified laterality  Primary osteoarthritis of both knees  Primary osteoarthritis of left hip  Other orders -     Discontinue: HYDROcodone-acetaminophen (NORCO/VICODIN) 5-325 MG tablet; Take 1 tablet by mouth 2 (two) times daily at 10 AM and 5 PM. -     HYDROcodone-acetaminophen (NORCO/VICODIN) 5-325 MG tablet; Take 1 tablet by mouth 2 (two) times daily at 10 AM and 5 PM.        ----------------------------------------------------------------------------------------------------------------------  Problem List Items Addressed This Visit    None    Visit Diagnoses    Bilateral sciatica    -   Primary   DDD (degenerative disc disease), lumbar       Relevant Medications   HYDROcodone-acetaminophen (NORCO/VICODIN) 5-325 MG tablet   Osteoarthritis of right hip, unspecified osteoarthritis type       Relevant Medications   HYDROcodone-acetaminophen (NORCO/VICODIN) 5-325 MG tablet   Facet arthritis of lumbosacral region       Relevant Medications   HYDROcodone-acetaminophen (NORCO/VICODIN) 5-325 MG tablet   Chronic pain syndrome       Chronic, continuous use of opioids       Relevant Orders   ToxASSURE Select 13 (MW), Urine   Chronic hand pain, unspecified laterality       Relevant Medications   HYDROcodone-acetaminophen (NORCO/VICODIN) 5-325 MG tablet   Primary osteoarthritis of both knees       Relevant Medications   HYDROcodone-acetaminophen (NORCO/VICODIN) 5-325 MG tablet   Primary osteoarthritis of left hip       Relevant Medications   HYDROcodone-acetaminophen (NORCO/VICODIN) 5-325 MG tablet        ----------------------------------------------------------------------------------------------------------------------  1. Bilateral sciatica Continue efforts at weight loss with core strengthening.  2. DDD (degenerative disc disease), lumbar As above  3. Osteoarthritis of right hip, unspecified osteoarthritis type   4. Facet arthritis of lumbosacral region   5. Chronic pain syndrome   6. Chronic, continuous use of opioids We reviewed the [facility] practitioner database information.  It is appropriate.  Refills were given for April 9 and May 9.  We have also requested a baseline urine tox screen. - ToxASSURE Select 13 (MW), Urine  7. Chronic hand pain, unspecified laterality   8. Primary osteoarthritis of both knees   9. Primary osteoarthritis of left hip     ----------------------------------------------------------------------------------------------------------------------  I am having Nicholas T. Petrasek "Tommy" maintain his oxymetazoline, B-12,  ferrous sulfate, hyoscyamine, Fish Oil, acetaminophen, ascorbic acid, Cranberry, MULTI-VITAMINS, gabapentin, QUEtiapine Fumarate, simvastatin, solifenacin, venlafaxine XR, Magnesium, flecainide, LORazepam, VESICARE, pantoprazole, ELIQUIS, and HYDROcodone-acetaminophen.   Meds ordered this encounter  Medications  . DISCONTD: HYDROcodone-acetaminophen (NORCO/VICODIN) 5-325 MG tablet    Sig: Take 1 tablet by mouth 2 (two) times daily at 10 AM and 5 PM.    Dispense:  60 tablet    Refill:  0    Do not fill until [id number]  . HYDROcodone-acetaminophen (NORCO/VICODIN) 5-325 MG tablet    Sig: Take 1 tablet by mouth 2 (two) times daily at 10 AM and 5 PM.    Dispense:  60 tablet    Refill:  0    Do not fill until [id number]   Patient's Medications  New Prescriptions   No medications on file  Previous Medications   ACETAMINOPHEN (TYLENOL) 500 MG TABLET    Take 2,000 mg by mouth daily as needed for moderate pain or headache.   ASCORBIC ACID (VITAMIN C) 250 MG TABLET    Take by mouth.   CRANBERRY 500 MG CAPS    Take by mouth.   CYANOCOBALAMIN (B-12) 1000 MCG CAPS    Take 1,000 mcg by mouth daily.    ELIQUIS 5 MG TABS TABLET    TAKE 1 TABLET BY MOUTH TWICE A DAY   FERROUS SULFATE 325 (65 FE) MG TABLET    Take 325 mg by mouth daily with breakfast.   FLECAINIDE (TAMBOCOR) 100 MG TABLET    Take 1 tablet (100 mg total) by mouth 2 (two) times daily.   GABAPENTIN (NEURONTIN) 400 MG CAPSULE    Take 1 capsule (400 mg total) by mouth 3 (three) times daily.   HYOSCYAMINE (LEVSIN, ANASPAZ) 0.125 MG TABLET    Take 0.125 mg by mouth every 6 (six) hours as needed for bladder spasms or cramping.    LORAZEPAM (ATIVAN) 1 MG TABLET    TAKE 1 TABLET BY MOUTH AT BEDTIME   MAGNESIUM 400 MG CAPS    Take 400 mg by mouth daily.   MULTIPLE VITAMIN (MULTI-VITAMINS) TABS    Take by mouth.   OMEGA-3 FATTY ACIDS (FISH OIL) 1200 MG CAPS    Take 1,200 mg by mouth daily.   OXYMETAZOLINE (AFRIN) 0.05 % NASAL SPRAY    Place 2 sprays  into both nostrils at bedtime as needed for congestion.   PANTOPRAZOLE (PROTONIX) 40 MG TABLET    TAKE 1 TABLET (40 MG TOTAL) BY MOUTH ONCE DAILY TAKE 30 MINS BEFORE MEAL   QUETIAPINE FUMARATE (SEROQUEL XR) 150 MG 24 HR TABLET    Take 1 tablet (150 mg total) by mouth at bedtime.   SIMVASTATIN (ZOCOR) 20 MG TABLET    Take 1 tablet (20 mg total) by mouth daily.   SOLIFENACIN (VESICARE) 10 MG TABLET    Take 1 tablet (10 mg total) by mouth daily.   VENLAFAXINE XR (EFFEXOR XR) 75 MG 24 HR CAPSULE    Take 3 capsules (225 mg total) by mouth daily.   VESICARE 10 MG TABLET    TAKE 1 TABLET BY MOUTH EVERY DAY  Modified Medications   Modified Medication Previous Medication   HYDROCODONE-ACETAMINOPHEN (NORCO/VICODIN) 5-325 MG TABLET HYDROcodone-acetaminophen (NORCO/VICODIN) 5-325 MG tablet      Take 1 tablet by mouth 2 (two) times daily at 10 AM and 5 PM.    Take 1 tablet by mouth 2 (two) times daily at 10 AM and 5 PM.  Discontinued Medications   No medications on file   ----------------------------------------------------------------------------------------------------------------------  Follow-up: No follow-ups on file.    [name], MD

## 2017-04-26 ENCOUNTER — Encounter: Payer: Self-pay | Admitting: Family Medicine

## 2017-04-27 ENCOUNTER — Other Ambulatory Visit: Payer: Self-pay | Admitting: Internal Medicine

## 2017-05-03 DIAGNOSIS — H9319 Tinnitus, unspecified ear: Secondary | ICD-10-CM | POA: Diagnosis not present

## 2017-05-03 DIAGNOSIS — H903 Sensorineural hearing loss, bilateral: Secondary | ICD-10-CM | POA: Diagnosis not present

## 2017-05-04 ENCOUNTER — Telehealth: Payer: Self-pay

## 2017-05-04 NOTE — Telephone Encounter (Signed)
   [facility] Pre-operative Risk Assessment    Request for surgical clearance:  1. What type of surgery is being performed? EGD   2. When is this surgery scheduled? 05/17/17   3. What type of clearance is required (medical clearance vs. Pharmacy clearance to hold med vs. Both)? Medical Clearance  4. Are there any medications that need to be held prior to surgery and how long? Eliquis hold reccommendation   5. Practice name and name of physician performing surgery? [facility] - Dr. Gustavo Lah   6. What is your office phone number [phone]    7.   What is your office fax number [phone] attn: Elbert Ewings  8.   Anesthesia type (None, local, MAC, general) ?  Monitored sedation   Jacinta Shoe 05/04/2017, 3:45 PM  _________________________________________________________________   (provider comments below)

## 2017-05-04 NOTE — Telephone Encounter (Signed)
   Primary Cardiologist: Dr. Caryl Comes  Chart reviewed as part of pre-operative protocol coverage. Given past medical history and time since last visit, based on ACC/AHA guidelines, Nicholas [name] would be at acceptable risk for the planned procedure without further cardiovascular testing.   Pt takes Eliquis for history of PE and is not being managed by cardiology for this condition. Recommend that prescribing MD Dr Jeananne Rama make recommendation regarding periprocedural holding of Eliquis. Please forward clearance request to his office.   Please call with questions.  Lyda Jester, PA-C 05/04/2017, 4:25 PM

## 2017-05-09 ENCOUNTER — Telehealth: Payer: Self-pay | Admitting: Family Medicine

## 2017-05-09 NOTE — Telephone Encounter (Signed)
Copied from [place] [id number]. Topic: Quick Communication - See Telephone Encounter >> May 09, 2017  2:16 PM Synthia Innocent wrote: CRM for notification. See Telephone encounter for: 05/09/17.patient having EGD on 05/13/17, clearance has been faxed today by [facility]. Just want to make sure it was received

## 2017-05-10 NOTE — Telephone Encounter (Signed)
Form received. Discussed with P.A. She will discuss with D.O. Will call Luria and/or fax once done.

## 2017-05-10 NOTE — Telephone Encounter (Signed)
Nicholas [name] called again about fax -she needs to know whe the pt should stop the eliquis as he is having a procedure.  She says that she needs to know today due to holiday.  Fax number is [phone]

## 2017-05-10 NOTE — Telephone Encounter (Signed)
Noted  

## 2017-05-10 NOTE — Telephone Encounter (Signed)
Spoke with providers. They are not comfortable with clearing patient as Nicholas [name] is PCP and he has not been seen since July. Luria at [facility] was informed. She stated she would notify patient.   Also suggested they speak to pt's cardiologist, which they stated they had, however, they stated they did not manage Eliquis so they would also not sign off.

## 2017-05-10 NOTE — Telephone Encounter (Signed)
Patient stopped by the office wanting to get a message to Dr Jeananne Rama regarding when he needed to stop the Eloquis for his procedure to be done next week but we informed him he would need to schedule an appointment for surgical clearance but patient stated he would need to think about it and get back to [place].

## 2017-05-13 NOTE — Telephone Encounter (Signed)
Call pt 

## 2017-05-14 NOTE — Telephone Encounter (Signed)
Patient's wife, Berline Chough, calling and states that she needs to know if patient is given the go ahead to stop taking  Eloquis for his procedure on Thursday (05/17/17). Please advise. CB#: [phone]

## 2017-05-14 NOTE — Telephone Encounter (Signed)
Patient was transferred to provider for telephone conversation.   

## 2017-05-16 ENCOUNTER — Encounter: Payer: Self-pay | Admitting: Student

## 2017-05-17 ENCOUNTER — Ambulatory Visit
Admission: RE | Admit: 2017-05-17 | Discharge: 2017-05-17 | Disposition: A | Discharge status: Home / Self Care | Payer: Medicare Other | Source: Ambulatory Visit | Attending: Gastroenterology | Admitting: Gastroenterology

## 2017-05-17 ENCOUNTER — Ambulatory Visit: Payer: Medicare Other | Admitting: Anesthesiology

## 2017-05-17 ENCOUNTER — Encounter
Admission: RE | Disposition: A | Discharge status: Home / Self Care | Payer: Self-pay | Source: Ambulatory Visit | Attending: Gastroenterology

## 2017-05-17 DIAGNOSIS — Z7901 Long term (current) use of anticoagulants: Secondary | ICD-10-CM | POA: Diagnosis not present

## 2017-05-17 DIAGNOSIS — F419 Anxiety disorder, unspecified: Secondary | ICD-10-CM | POA: Diagnosis not present

## 2017-05-17 DIAGNOSIS — K224 Dyskinesia of esophagus: Secondary | ICD-10-CM | POA: Insufficient documentation

## 2017-05-17 DIAGNOSIS — Z79899 Other long term (current) drug therapy: Secondary | ICD-10-CM | POA: Diagnosis not present

## 2017-05-17 DIAGNOSIS — B3781 Candidal esophagitis: Secondary | ICD-10-CM | POA: Diagnosis not present

## 2017-05-17 DIAGNOSIS — K2950 Unspecified chronic gastritis without bleeding: Secondary | ICD-10-CM | POA: Insufficient documentation

## 2017-05-17 DIAGNOSIS — R1310 Dysphagia, unspecified: Secondary | ICD-10-CM | POA: Diagnosis not present

## 2017-05-17 DIAGNOSIS — F329 Major depressive disorder, single episode, unspecified: Secondary | ICD-10-CM | POA: Insufficient documentation

## 2017-05-17 DIAGNOSIS — E785 Hyperlipidemia, unspecified: Secondary | ICD-10-CM | POA: Insufficient documentation

## 2017-05-17 DIAGNOSIS — Z86718 Personal history of other venous thrombosis and embolism: Secondary | ICD-10-CM | POA: Insufficient documentation

## 2017-05-17 DIAGNOSIS — K259 Gastric ulcer, unspecified as acute or chronic, without hemorrhage or perforation: Secondary | ICD-10-CM | POA: Diagnosis not present

## 2017-05-17 DIAGNOSIS — I10 Essential (primary) hypertension: Secondary | ICD-10-CM | POA: Diagnosis not present

## 2017-05-17 DIAGNOSIS — K219 Gastro-esophageal reflux disease without esophagitis: Secondary | ICD-10-CM | POA: Diagnosis not present

## 2017-05-17 DIAGNOSIS — Z86711 Personal history of pulmonary embolism: Secondary | ICD-10-CM | POA: Diagnosis not present

## 2017-05-17 DIAGNOSIS — K210 Gastro-esophageal reflux disease with esophagitis: Secondary | ICD-10-CM | POA: Diagnosis not present

## 2017-05-17 DIAGNOSIS — K449 Diaphragmatic hernia without obstruction or gangrene: Secondary | ICD-10-CM | POA: Insufficient documentation

## 2017-05-17 DIAGNOSIS — Z79891 Long term (current) use of opiate analgesic: Secondary | ICD-10-CM | POA: Diagnosis not present

## 2017-05-17 DIAGNOSIS — K2960 Other gastritis without bleeding: Secondary | ICD-10-CM | POA: Diagnosis not present

## 2017-05-17 HISTORY — DX: Ulcer of esophagus without bleeding: K22.10

## 2017-05-17 HISTORY — PX: ESOPHAGOGASTRODUODENOSCOPY (EGD) WITH PROPOFOL: SHX5813

## 2017-05-17 HISTORY — DX: Other hemorrhoids: K64.8

## 2017-05-17 HISTORY — DX: Other gastritis without bleeding: K29.60

## 2017-05-17 SURGERY — ESOPHAGOGASTRODUODENOSCOPY (EGD) WITH PROPOFOL
Anesthesia: General

## 2017-05-17 MED ORDER — KETAMINE HCL 100 MG/ML IJ SOLN
INTRAMUSCULAR | Status: DC | PRN
  Administered 2017-05-17: 15 mg via INTRAVENOUS

## 2017-05-17 MED ORDER — PROPOFOL 10 MG/ML IV BOLUS
INTRAVENOUS | Status: AC
  Filled 2017-05-17: qty 20

## 2017-05-17 MED ORDER — PROPOFOL 10 MG/ML IV BOLUS
INTRAVENOUS | Status: DC | PRN
  Administered 2017-05-17: 50 mg via INTRAVENOUS
  Administered 2017-05-17: 70 mg via INTRAVENOUS
  Administered 2017-05-17: 40 mg via INTRAVENOUS
  Administered 2017-05-17: 30 mg via INTRAVENOUS

## 2017-05-17 MED ORDER — BUTAMBEN-TETRACAINE-BENZOCAINE 2-2-14 % EX AERO
INHALATION_SPRAY | CUTANEOUS | Status: AC
  Filled 2017-05-17: qty 5

## 2017-05-17 MED ORDER — SODIUM CHLORIDE 0.9 % IV SOLN
INTRAVENOUS | Status: DC
  Administered 2017-05-17: 1000 mL via INTRAVENOUS

## 2017-05-17 MED ORDER — PROPOFOL 500 MG/50ML IV EMUL
INTRAVENOUS | Status: AC
  Filled 2017-05-17: qty 50

## 2017-05-17 MED ORDER — SODIUM CHLORIDE 0.9 % IJ SOLN
INTRAMUSCULAR | Status: AC
  Filled 2017-05-17: qty 10

## 2017-05-17 MED ORDER — GLYCOPYRROLATE 0.2 MG/ML IJ SOLN
INTRAMUSCULAR | Status: AC
  Filled 2017-05-17: qty 1

## 2017-05-17 MED ORDER — PROPOFOL 500 MG/50ML IV EMUL
INTRAVENOUS | Status: DC | PRN
  Administered 2017-05-17: 200 ug/kg/min via INTRAVENOUS

## 2017-05-17 MED ORDER — LIDOCAINE HCL (CARDIAC) PF 100 MG/5ML IV SOSY
PREFILLED_SYRINGE | INTRAVENOUS | Status: DC | PRN
  Administered 2017-05-17: 100 mg via INTRAVENOUS

## 2017-05-17 NOTE — H&P (Signed)
Outpatient short stay form Pre-procedure 05/17/2017 8:35 AM Nicholas Sails MD  Primary Physician: Dr. Brandon Melnick  Reason for visit: EGD  History of present illness: Patient is a 72 year old male presenting today as above.  He has a personal history of GERD.  He intermittently takes photon pump inhibitors.  He has a sensation of food sticking in the mid and upper esophagus.  He had a barium swallow indicating a focal area of nonobstructing narrowing in the mid cervical esophagus at about C5-6 at least in part due to a prominent anterior endplate osteophyte.  He also has some mild changes of presbyesophagus.  There was a short segment focus of fixed narrowing at the GE junction proximal to the nonreducing hiatal hernia.  The barium tablet did hang there but did not improve produce symptoms.  He does take Eliquis is held that agent for at least 3 to 4 days.  Takes no other aspirin products or blood thinning agent.    Current Facility-Administered Medications:  .  butamben-tetracaine-benzocaine (CETACAINE) 02-25-12 % spray, , , ,  .  0.9 %  sodium chloride infusion, , Intravenous, Continuous, Nicholas Sails, MD, Last Rate: 20 mL/hr at 05/17/17 0826, 1,000 mL at 05/17/17 3710  Medications Prior to Admission  Medication Sig Dispense Refill Last Dose  . acetaminophen (TYLENOL) 500 MG tablet Take 2,000 mg by mouth daily as needed for moderate pain or headache.   Past Week at Unknown time  . ascorbic acid (VITAMIN C) 250 MG tablet Take by mouth.   Past Week at Unknown time  . b complex vitamins capsule Take 1 capsule by mouth daily.   Past Week at Unknown time  . Cranberry 500 MG CAPS Take by mouth.   Past Week at Unknown time  . Cyanocobalamin (B-12) 1000 MCG CAPS Take 1,000 mcg by mouth daily.    Past Week at Unknown time  . ELIQUIS 5 MG TABS tablet TAKE 1 TABLET BY MOUTH TWICE A DAY 60 tablet 0 Past Week at Unknown time  . ferrous sulfate 325 (65 FE) MG tablet Take 325 mg by mouth daily  with breakfast.   Past Week at Unknown time  . flecainide (TAMBOCOR) 100 MG tablet Take 1 tablet (100 mg total) by mouth 2 (two) times daily. 180 tablet 3 Past Week at Unknown time  . gabapentin (NEURONTIN) 400 MG capsule Take 1 capsule (400 mg total) by mouth 3 (three) times daily. 270 capsule 4 Past Week at Unknown time  . HYDROcodone-acetaminophen (NORCO/VICODIN) 5-325 MG tablet Take 1 tablet by mouth 2 (two) times daily at 10 AM and 5 PM. 60 tablet 0 05/16/2017 at Unknown time  . hyoscyamine (LEVSIN, ANASPAZ) 0.125 MG tablet Take 0.125 mg by mouth every 6 (six) hours as needed for bladder spasms or cramping.    Past Week at Unknown time  . LORazepam (ATIVAN) 1 MG tablet TAKE 1 TABLET BY MOUTH AT BEDTIME 90 tablet 1 Past Week at Unknown time  . Magnesium 400 MG CAPS Take 400 mg by mouth daily.   Past Week at Unknown time  . Multiple Vitamin (MULTI-VITAMINS) TABS Take by mouth.   Past Week at Unknown time  . Omega-3 Fatty Acids (FISH OIL) 1200 MG CAPS Take 1,200 mg by mouth daily.   Past Week at Unknown time  . oxymetazoline (AFRIN) 0.05 % nasal spray Place 2 sprays into both nostrils at bedtime as needed for congestion.   05/16/2017 at Unknown time  . pantoprazole (PROTONIX) 40 MG  tablet TAKE 1 TABLET (40 MG TOTAL) BY MOUTH ONCE DAILY TAKE 30 MINS BEFORE MEAL  6 Past Week at Unknown time  . QUEtiapine Fumarate (SEROQUEL XR) 150 MG 24 hr tablet Take 1 tablet (150 mg total) by mouth at bedtime. 90 tablet 4 Past Week at Unknown time  . simvastatin (ZOCOR) 20 MG tablet Take 1 tablet (20 mg total) by mouth daily. 90 tablet 4 Past Week at Unknown time  . solifenacin (VESICARE) 10 MG tablet Take 1 tablet (10 mg total) by mouth daily. 90 tablet 4 Past Week at Unknown time  . venlafaxine XR (EFFEXOR XR) 75 MG 24 hr capsule Take 3 capsules (225 mg total) by mouth daily. 270 capsule 4 Past Week at Unknown time  . VESICARE 10 MG tablet TAKE 1 TABLET BY MOUTH EVERY DAY 90 tablet 0 Past Week at Unknown time      No Known Allergies   Past Medical History:  Diagnosis Date  . Anterior urethral stricture   . Anxiety   . Arthritis    a. knees, hips, hands;  b. 11/2013 s/p L TKA @ [place].  . Bile reflux gastritis   . Bulging lumbar disc   . BXO (balanitis xerotica obliterans)   . 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 Depression   . DVT (deep venous thrombosis) ([place])   . Erosive esophagitis   . Gross hematuria   . Hyperlipemia   . Hypertension    borderline  . Internal hemorrhoids   . Phimosis   . Pulmonary embolism (HCC)     Review of systems:      Physical Exam    Heart and lungs: Regular rate and rhythm without rub or gallop, lungs are bilaterally clear.    HEENT: Normocephalic atraumatic eyes are anicteric    Other:    Pertinant exam for procedure: Soft nontender nondistended bowel sounds positive normoactive    Planned proceedures: EGD and indicated procedures. I have discussed the risks benefits and complications of procedures to include not limited to bleeding, infection, perforation and the risk of sedation and the patient wishes to proceed.    Nicholas Sails, MD Gastroenterology 05/17/2017  8:35 AM

## 2017-05-17 NOTE — Op Note (Addendum)
[facility] Patient Name: Nicholas [name] Procedure Date: 05/17/2017 8:38 AM MRN: [id number] Account #: [id number] Date of Birth: 1945-05-24 Admit Type: Outpatient Age: 72 Room: [facility] ENDO ROOM 1 Gender: Male Note Status: Finalized Procedure:            Upper GI endoscopy Indications:          Dysphagia, Gastro-esophageal reflux disease Providers:            Lollie Sails, MD Referring MD:         [name], MD (Referring MD) Medicines:            Monitored Anesthesia Care Complications:        No immediate complications. Procedure:            Pre-Anesthesia Assessment:                       - ASA Grade Assessment: III - A patient with severe                        systemic disease.                       After obtaining informed consent, the endoscope was                        passed under direct vision. Throughout the procedure,                        the patient's blood pressure, pulse, and oxygen                        saturations were monitored continuously. The Endoscope                        was introduced through the mouth, and advanced to the                        third part of duodenum. The upper GI endoscopy was                        accomplished without difficulty. The patient tolerated                        the procedure well. Findings:      The Z-line was variable.      Patchy mild inflammation characterized by congestion (edema), erosions       and erythema was found in the gastric body and in the gastric antrum.       Biopsies were taken with a cold forceps for histology. Biopsies were       taken with a cold forceps for Helicobacter pylori testing.      The in the duodenum was normal.      There is no overt evidence of a ring or stenosis throughout. The       cervical esophagus is normal in appearance. It is of note that there is       alot of mucus in the esophageal lumen and possible candida. Brushings        taken.      A TTS dilator was passed through the scope. Dilation with a 15-16.5-18  mm balloon dilator was performed to 16 mm in the lower third of the       esophagus.      Abnormal motility was noted in the mid esophagus and in the distal       esophagus. The cricopharyngeus was normal. There is spasticity of the       esophageal body. The distal esophagus/lower esophageal sphincter is       patulous. Tertiary peristaltic waves are noted.      Biopsies were taken with a cold forceps at the gastroesophageal junction       for histology.      The cardia and gastric fundus were normal on retroflexion.      A small hiatal hernia was found. The Z-line was a variable distance from       incisors; the hiatal hernia was sliding. Impression:           - Z-line variable.                       - Erosive gastritis. Biopsied.                       - Normal.                       - Abnormal esophageal motility, consistent with                        presbyesophagus.                       - Small hiatal hernia.                       - Dilation performed in the lower third of the                        esophagus.                       - Biopsies were taken with a cold forceps for histology                        at the gastroesophageal junction. Recommendation:       - Use Protonix (pantoprazole) 40 mg PO BID for 2 weeks.                       - Use Protonix (pantoprazole) 40 mg PO daily daily. Procedure Code(s):    --- Professional ---                       [id number], Esophagogastroduodenoscopy, flexible, transoral;                        with transendoscopic balloon dilation of esophagus                        (less than 30 mm diameter)                       43239, Esophagogastroduodenoscopy, flexible, transoral;                        with biopsy, single or multiple Diagnosis Code(s):    ---  Professional ---                       K22.8, Other specified diseases of esophagus                        K29.60, Other gastritis without bleeding                       K22.4, Dyskinesia of esophagus                       K44.9, Diaphragmatic hernia without obstruction or                        gangrene                       R13.10, Dysphagia, unspecified                       K21.9, Gastro-esophageal reflux disease without                        esophagitis CPT copyright 2017 [employer]. All rights reserved. The codes documented in this report are preliminary and upon coder review may  be revised to meet current compliance requirements. Lollie Sails, MD 05/17/2017 9:22:41 AM This report has been signed electronically. Number of Addenda: 0 Note Initiated On: 05/17/2017 8:38 AM      [facility]

## 2017-05-17 NOTE — Anesthesia Post-op Follow-up Note (Signed)
Anesthesia QCDR form completed.        

## 2017-05-17 NOTE — Transfer of Care (Signed)
Immediate Anesthesia Transfer of Care Note  Patient: Nicholas [name]  Procedure(s) Performed: ESOPHAGOGASTRODUODENOSCOPY (EGD) WITH PROPOFOL (N/A )  Patient Location: PACU and Endoscopy Unit  Anesthesia Type:General  Level of Consciousness: awake  Airway & Oxygen Therapy: Patient Spontanous Breathing  Post-op Assessment: Report given to RN  Post vital signs: stable  Last Vitals:  Vitals Value Taken Time  BP 118/70 05/17/2017  9:21 AM  Temp 35.9 C 05/17/2017  9:21 AM  Pulse 63 05/17/2017  9:21 AM  Resp 18 05/17/2017  9:21 AM  SpO2 93 % 05/17/2017  9:21 AM  Vitals shown include unvalidated device data.  Last Pain:  Vitals:   05/17/17 0921  TempSrc: Tympanic  PainSc:          Complications: No apparent anesthesia complications

## 2017-05-17 NOTE — Anesthesia Postprocedure Evaluation (Signed)
Anesthesia Post Note  Patient: Nicholas [name]  Procedure(s) Performed: ESOPHAGOGASTRODUODENOSCOPY (EGD) WITH PROPOFOL (N/A )  Patient location during evaluation: Endoscopy Anesthesia Type: General Level of consciousness: awake and alert Pain management: pain level controlled Vital Signs Assessment: post-procedure vital signs reviewed and stable Respiratory status: spontaneous breathing and respiratory function stable Cardiovascular status: stable Anesthetic complications: no     Last Vitals:  Vitals:   05/17/17 0812 05/17/17 0921  BP: (!) 142/87 118/70  Pulse: 77   Resp: 20   Temp: (!) 35.8 C (!) 35.9 C  SpO2: 95%     Last Pain:  Vitals:   05/17/17 0921  TempSrc: Tympanic  PainSc:                  Nathifa Ritthaler K

## 2017-05-17 NOTE — Anesthesia Preprocedure Evaluation (Signed)
Anesthesia Evaluation  Patient identified by MRN, date of birth, ID band Patient awake    Reviewed: Allergy & Precautions, NPO status , Patient's Chart, lab work & pertinent test results  History of Anesthesia Complications Negative for: history of anesthetic complications  Airway Mallampati: II       Dental   Pulmonary neg sleep apnea, neg COPD,           Cardiovascular hypertension, Pt. on medications (-) Past MI and (-) CHF (-) dysrhythmias (-) Valvular Problems/Murmurs     Neuro/Psych neg Seizures Anxiety Depression    GI/Hepatic Neg liver ROS, PUD, GERD  Medicated,  Endo/Other  neg diabetes  Renal/GU negative Renal ROS     Musculoskeletal   Abdominal   Peds  Hematology   Anesthesia Other Findings   Reproductive/Obstetrics                            Anesthesia Physical Anesthesia Plan  ASA: III  Anesthesia Plan: General   Post-op Pain Management:    Induction: Intravenous  PONV Risk Score and Plan: 2 and TIVA and Propofol infusion  Airway Management Planned: Nasal Cannula  Additional Equipment:   Intra-op Plan:   Post-operative Plan:   Informed Consent: I have reviewed the patients History and Physical, chart, labs and discussed the procedure including the risks, benefits and alternatives for the proposed anesthesia with the patient or authorized representative who has indicated his/her understanding and acceptance.     Plan Discussed with:   Anesthesia Plan Comments:         Anesthesia Quick Evaluation

## 2017-05-18 ENCOUNTER — Encounter: Payer: Self-pay | Admitting: Gastroenterology

## 2017-05-18 LAB — SURGICAL PATHOLOGY

## 2017-05-20 ENCOUNTER — Other Ambulatory Visit: Payer: Self-pay | Admitting: Family Medicine

## 2017-05-22 ENCOUNTER — Encounter: Payer: BLUE CROSS/BLUE SHIELD | Admitting: Family Medicine

## 2017-05-25 ENCOUNTER — Other Ambulatory Visit: Payer: Self-pay | Admitting: Family Medicine

## 2017-06-05 ENCOUNTER — Encounter: Payer: Medicare Other | Admitting: *Deleted

## 2017-06-05 ENCOUNTER — Telehealth: Payer: Self-pay | Admitting: Cardiology

## 2017-06-05 DIAGNOSIS — M5416 Radiculopathy, lumbar region: Secondary | ICD-10-CM | POA: Insufficient documentation

## 2017-06-05 DIAGNOSIS — M7062 Trochanteric bursitis, left hip: Secondary | ICD-10-CM | POA: Diagnosis not present

## 2017-06-05 DIAGNOSIS — M545 Low back pain: Secondary | ICD-10-CM | POA: Diagnosis not present

## 2017-06-05 DIAGNOSIS — M7060 Trochanteric bursitis, unspecified hip: Secondary | ICD-10-CM | POA: Insufficient documentation

## 2017-06-05 NOTE — Telephone Encounter (Signed)
LMOVM reminding pt to send remote transmission.   

## 2017-06-06 DIAGNOSIS — R1310 Dysphagia, unspecified: Secondary | ICD-10-CM | POA: Diagnosis not present

## 2017-06-06 DIAGNOSIS — K219 Gastro-esophageal reflux disease without esophagitis: Secondary | ICD-10-CM | POA: Diagnosis not present

## 2017-06-06 DIAGNOSIS — K580 Irritable bowel syndrome with diarrhea: Secondary | ICD-10-CM | POA: Diagnosis not present

## 2017-06-07 ENCOUNTER — Encounter: Payer: Self-pay | Admitting: Cardiology

## 2017-06-11 ENCOUNTER — Other Ambulatory Visit: Payer: Self-pay

## 2017-06-11 ENCOUNTER — Ambulatory Visit (INDEPENDENT_AMBULATORY_CARE_PROVIDER_SITE_OTHER): Payer: Medicare Other | Admitting: *Deleted

## 2017-06-11 ENCOUNTER — Emergency Department: Payer: Medicare Other

## 2017-06-11 ENCOUNTER — Observation Stay
Admission: EM | Admit: 2017-06-11 | Discharge: 2017-06-12 | Disposition: A | Discharge status: Home / Self Care | Payer: Medicare Other | Attending: Specialist | Admitting: Specialist

## 2017-06-11 ENCOUNTER — Encounter: Payer: Self-pay | Admitting: *Deleted

## 2017-06-11 DIAGNOSIS — I4891 Unspecified atrial fibrillation: Secondary | ICD-10-CM | POA: Insufficient documentation

## 2017-06-11 DIAGNOSIS — Z833 Family history of diabetes mellitus: Secondary | ICD-10-CM | POA: Insufficient documentation

## 2017-06-11 DIAGNOSIS — Z86711 Personal history of pulmonary embolism: Secondary | ICD-10-CM | POA: Insufficient documentation

## 2017-06-11 DIAGNOSIS — Z79899 Other long term (current) drug therapy: Secondary | ICD-10-CM | POA: Insufficient documentation

## 2017-06-11 DIAGNOSIS — Z811 Family history of alcohol abuse and dependence: Secondary | ICD-10-CM | POA: Insufficient documentation

## 2017-06-11 DIAGNOSIS — K219 Gastro-esophageal reflux disease without esophagitis: Secondary | ICD-10-CM | POA: Insufficient documentation

## 2017-06-11 DIAGNOSIS — E785 Hyperlipidemia, unspecified: Secondary | ICD-10-CM | POA: Insufficient documentation

## 2017-06-11 DIAGNOSIS — W19XXXA Unspecified fall, initial encounter: Secondary | ICD-10-CM | POA: Insufficient documentation

## 2017-06-11 DIAGNOSIS — Z6841 Body Mass Index (BMI) 40.0 and over, adult: Secondary | ICD-10-CM | POA: Diagnosis not present

## 2017-06-11 DIAGNOSIS — I442 Atrioventricular block, complete: Secondary | ICD-10-CM | POA: Diagnosis not present

## 2017-06-11 DIAGNOSIS — Z950 Presence of cardiac pacemaker: Secondary | ICD-10-CM | POA: Insufficient documentation

## 2017-06-11 DIAGNOSIS — S0101XA Laceration without foreign body of scalp, initial encounter: Secondary | ICD-10-CM | POA: Insufficient documentation

## 2017-06-11 DIAGNOSIS — Z86718 Personal history of other venous thrombosis and embolism: Secondary | ICD-10-CM | POA: Diagnosis not present

## 2017-06-11 DIAGNOSIS — M19042 Primary osteoarthritis, left hand: Secondary | ICD-10-CM | POA: Diagnosis not present

## 2017-06-11 DIAGNOSIS — Z8249 Family history of ischemic heart disease and other diseases of the circulatory system: Secondary | ICD-10-CM | POA: Insufficient documentation

## 2017-06-11 DIAGNOSIS — F419 Anxiety disorder, unspecified: Secondary | ICD-10-CM | POA: Diagnosis not present

## 2017-06-11 DIAGNOSIS — Z96652 Presence of left artificial knee joint: Secondary | ICD-10-CM | POA: Insufficient documentation

## 2017-06-11 DIAGNOSIS — I10 Essential (primary) hypertension: Secondary | ICD-10-CM | POA: Diagnosis not present

## 2017-06-11 DIAGNOSIS — N390 Urinary tract infection, site not specified: Secondary | ICD-10-CM | POA: Diagnosis not present

## 2017-06-11 DIAGNOSIS — E669 Obesity, unspecified: Secondary | ICD-10-CM | POA: Insufficient documentation

## 2017-06-11 DIAGNOSIS — I951 Orthostatic hypotension: Secondary | ICD-10-CM | POA: Diagnosis not present

## 2017-06-11 DIAGNOSIS — M19041 Primary osteoarthritis, right hand: Secondary | ICD-10-CM | POA: Insufficient documentation

## 2017-06-11 DIAGNOSIS — Z96643 Presence of artificial hip joint, bilateral: Secondary | ICD-10-CM | POA: Insufficient documentation

## 2017-06-11 DIAGNOSIS — R42 Dizziness and giddiness: Secondary | ICD-10-CM | POA: Diagnosis not present

## 2017-06-11 DIAGNOSIS — N179 Acute kidney failure, unspecified: Secondary | ICD-10-CM | POA: Diagnosis not present

## 2017-06-11 DIAGNOSIS — R55 Syncope and collapse: Secondary | ICD-10-CM | POA: Diagnosis not present

## 2017-06-11 DIAGNOSIS — F329 Major depressive disorder, single episode, unspecified: Secondary | ICD-10-CM | POA: Diagnosis not present

## 2017-06-11 DIAGNOSIS — Z7901 Long term (current) use of anticoagulants: Secondary | ICD-10-CM | POA: Insufficient documentation

## 2017-06-11 DIAGNOSIS — G629 Polyneuropathy, unspecified: Secondary | ICD-10-CM | POA: Diagnosis not present

## 2017-06-11 DIAGNOSIS — Z809 Family history of malignant neoplasm, unspecified: Secondary | ICD-10-CM | POA: Insufficient documentation

## 2017-06-11 DIAGNOSIS — S0083XA Contusion of other part of head, initial encounter: Secondary | ICD-10-CM | POA: Diagnosis not present

## 2017-06-11 DIAGNOSIS — S0990XA Unspecified injury of head, initial encounter: Secondary | ICD-10-CM | POA: Diagnosis not present

## 2017-06-11 DIAGNOSIS — S199XXA Unspecified injury of neck, initial encounter: Secondary | ICD-10-CM | POA: Diagnosis not present

## 2017-06-11 DIAGNOSIS — Z8042 Family history of malignant neoplasm of prostate: Secondary | ICD-10-CM | POA: Insufficient documentation

## 2017-06-11 DIAGNOSIS — E860 Dehydration: Secondary | ICD-10-CM | POA: Diagnosis not present

## 2017-06-11 LAB — URINALYSIS, COMPLETE (UACMP) WITH MICROSCOPIC
Bilirubin Urine: NEGATIVE
Glucose, UA: NEGATIVE mg/dL
HGB URINE DIPSTICK: NEGATIVE
Ketones, ur: 5 mg/dL — AB
Nitrite: NEGATIVE
Protein, ur: NEGATIVE mg/dL
SPECIFIC GRAVITY, URINE: 1.025 (ref 1.005–1.030)
pH: 5 (ref 5.0–8.0)

## 2017-06-11 LAB — CBC
HCT: 46 % (ref 40.0–52.0)
Hemoglobin: 15.8 g/dL (ref 13.0–18.0)
MCH: 31.8 pg (ref 26.0–34.0)
MCHC: 34.3 g/dL (ref 32.0–36.0)
MCV: 92.8 fL (ref 80.0–100.0)
Platelets: 182 10*3/uL (ref 150–440)
RBC: 4.95 MIL/uL (ref 4.40–5.90)
RDW: 13.7 % (ref 11.5–14.5)
WBC: 14.5 10*3/uL — AB (ref 3.8–10.6)

## 2017-06-11 LAB — COMPREHENSIVE METABOLIC PANEL
ALBUMIN: 4.8 g/dL (ref 3.5–5.0)
ALT: 21 U/L (ref 17–63)
AST: 33 U/L (ref 15–41)
Alkaline Phosphatase: 45 U/L (ref 38–126)
Anion gap: 10 (ref 5–15)
BUN: 44 mg/dL — ABNORMAL HIGH (ref 6–20)
CHLORIDE: 98 mmol/L — AB (ref 101–111)
CO2: 27 mmol/L (ref 22–32)
CREATININE: 1.88 mg/dL — AB (ref 0.61–1.24)
Calcium: 9.3 mg/dL (ref 8.9–10.3)
GFR calc Af Amer: 39 mL/min — ABNORMAL LOW (ref >60)
GFR, EST NON AFRICAN AMERICAN: 34 mL/min — AB (ref >60)
Glucose, Bld: 121 mg/dL — ABNORMAL HIGH (ref 65–99)
POTASSIUM: 4.7 mmol/L (ref 3.5–5.1)
SODIUM: 135 mmol/L (ref 135–145)
Total Bilirubin: 1.2 mg/dL (ref 0.3–1.2)
Total Protein: 7.9 g/dL (ref 6.5–8.1)

## 2017-06-11 LAB — TROPONIN I

## 2017-06-11 LAB — ETHANOL

## 2017-06-11 MED ORDER — QUETIAPINE FUMARATE ER 50 MG PO TB24
150.0000 mg | ORAL_TABLET | Freq: Every day | ORAL | Status: DC
  Administered 2017-06-11: 150 mg via ORAL
  Filled 2017-06-11 (×2): qty 3

## 2017-06-11 MED ORDER — GABAPENTIN 400 MG PO CAPS
400.0000 mg | ORAL_CAPSULE | Freq: Three times a day (TID) | ORAL | Status: DC
  Administered 2017-06-11 – 2017-06-12 (×2): 400 mg via ORAL
  Filled 2017-06-11 (×2): qty 1

## 2017-06-11 MED ORDER — HYDROCODONE-ACETAMINOPHEN 5-325 MG PO TABS
1.0000 | ORAL_TABLET | Freq: Two times a day (BID) | ORAL | Status: DC
  Administered 2017-06-12: 1 via ORAL
  Filled 2017-06-11: qty 1

## 2017-06-11 MED ORDER — FERROUS SULFATE 325 (65 FE) MG PO TABS
325.0000 mg | ORAL_TABLET | Freq: Every day | ORAL | Status: DC
  Administered 2017-06-12: 325 mg via ORAL
  Filled 2017-06-11: qty 1

## 2017-06-11 MED ORDER — VITAMIN C 500 MG PO TABS
500.0000 mg | ORAL_TABLET | Freq: Two times a day (BID) | ORAL | Status: DC
  Administered 2017-06-11 – 2017-06-12 (×2): 500 mg via ORAL
  Filled 2017-06-11 (×3): qty 1

## 2017-06-11 MED ORDER — VENLAFAXINE HCL ER 75 MG PO CP24
225.0000 mg | ORAL_CAPSULE | Freq: Every day | ORAL | Status: DC
  Administered 2017-06-12: 225 mg via ORAL
  Filled 2017-06-11: qty 3

## 2017-06-11 MED ORDER — HYOSCYAMINE SULFATE 0.125 MG PO TBDP
0.1250 mg | ORAL_TABLET | Freq: Four times a day (QID) | ORAL | Status: DC | PRN
  Filled 2017-06-11: qty 1

## 2017-06-11 MED ORDER — FLECAINIDE ACETATE 100 MG PO TABS
100.0000 mg | ORAL_TABLET | Freq: Two times a day (BID) | ORAL | Status: DC
  Administered 2017-06-11 – 2017-06-12 (×2): 100 mg via ORAL
  Filled 2017-06-11 (×3): qty 1

## 2017-06-11 MED ORDER — DOCUSATE SODIUM 100 MG PO CAPS: 100.0000 mg | ORAL_CAPSULE | Freq: Two times a day (BID) | ORAL | Status: DC | PRN

## 2017-06-11 MED ORDER — PANTOPRAZOLE SODIUM 40 MG PO TBEC
40.0000 mg | DELAYED_RELEASE_TABLET | Freq: Every day | ORAL | Status: DC
  Administered 2017-06-12: 40 mg via ORAL
  Filled 2017-06-11: qty 1

## 2017-06-11 MED ORDER — LORAZEPAM 1 MG PO TABS
1.0000 mg | ORAL_TABLET | Freq: Every day | ORAL | Status: DC
  Administered 2017-06-11: 1 mg via ORAL
  Filled 2017-06-11: qty 1

## 2017-06-11 MED ORDER — TETANUS-DIPHTH-ACELL PERTUSSIS 5-2.5-18.5 LF-MCG/0.5 IM SUSP
0.5000 mL | Freq: Once | INTRAMUSCULAR | Status: AC
  Administered 2017-06-11: 0.5 mL via INTRAMUSCULAR
  Filled 2017-06-11: qty 0.5

## 2017-06-11 MED ORDER — APIXABAN 5 MG PO TABS
5.0000 mg | ORAL_TABLET | Freq: Two times a day (BID) | ORAL | Status: DC
  Administered 2017-06-11 – 2017-06-12 (×2): 5 mg via ORAL
  Filled 2017-06-11 (×2): qty 1

## 2017-06-11 MED ORDER — SIMVASTATIN 20 MG PO TABS
20.0000 mg | ORAL_TABLET | Freq: Every day | ORAL | Status: DC
  Administered 2017-06-12: 20 mg via ORAL
  Filled 2017-06-11: qty 1

## 2017-06-11 MED ORDER — MAGNESIUM OXIDE 400 (241.3 MG) MG PO TABS
400.0000 mg | ORAL_TABLET | Freq: Every day | ORAL | Status: DC
  Administered 2017-06-12: 400 mg via ORAL
  Filled 2017-06-11: qty 1

## 2017-06-11 MED ORDER — OXYMETAZOLINE HCL 0.05 % NA SOLN
2.0000 | Freq: Every evening | NASAL | Status: DC | PRN
  Filled 2017-06-11: qty 15

## 2017-06-11 MED ORDER — SODIUM CHLORIDE 0.9 % IV BOLUS
1000.0000 mL | Freq: Once | INTRAVENOUS | Status: AC
  Administered 2017-06-11: 1000 mL via INTRAVENOUS

## 2017-06-11 MED ORDER — SODIUM CHLORIDE 0.9 % IV SOLN
1.0000 g | INTRAVENOUS | Status: DC
  Administered 2017-06-11: 1 g via INTRAVENOUS
  Filled 2017-06-11 (×2): qty 10

## 2017-06-11 MED ORDER — DARIFENACIN HYDROBROMIDE ER 7.5 MG PO TB24
7.5000 mg | ORAL_TABLET | Freq: Every day | ORAL | Status: DC
  Administered 2017-06-12: 7.5 mg via ORAL
  Filled 2017-06-11: qty 1

## 2017-06-11 MED ORDER — SODIUM CHLORIDE 0.9 % IV SOLN
INTRAVENOUS | Status: DC
  Administered 2017-06-11 – 2017-06-12 (×2): via INTRAVENOUS

## 2017-06-11 MED ORDER — ACETAMINOPHEN 325 MG PO TABS
650.0000 mg | ORAL_TABLET | Freq: Every day | ORAL | Status: DC | PRN
  Administered 2017-06-11: 650 mg via ORAL
  Filled 2017-06-11: qty 2

## 2017-06-11 NOTE — ED Notes (Signed)
Patient transported to 235

## 2017-06-11 NOTE — ED Triage Notes (Signed)
Pt presents via EMS after a fall at home. Pt played golf today, endorses frequent dizziness during activity. Pt states upon arrival home, he became weak and dizzy, falling down on a hardwood floor. Pt has laceration to posterior scalp. Pt is taking eliquis 5 mg BID. EMS reported a significant amount of blood on the floor. Pt denies LoC. Pt denies CP/ShoB/n/v/d. Pt has a pacemaker and EMS stated he was in a paced rhythm. Pt has small amount of ETOH on board. Pt has not been well hydrated per his report.

## 2017-06-11 NOTE — ED Provider Notes (Signed)
[facility] Emergency Department Provider Note  ____________________________________________  Time seen: Approximately 3:27 PM  I have reviewed the triage vital signs and the nursing notes.   HISTORY  Chief Complaint Near Syncope; Fall; and Head Injury   HPI Nicholas [name] is a 72 y.o. male with a history of complete heart block status post Medtronic pacemaker, pulmonary embolism on Eliquis, atrial fibrillation, hypertension, hyperlipidemia who presents for evaluation of near syncope.  Patient reports that for the last few days has been having brief episodes of lightheadedness.  Today he had a golf tournament from 9-1PM. He was out in the 90-degree weather and only had one soda. He had normal breakfast before the tournament.  During the tournament he reports having several episodes of lightheadedness but they were brief and not severe.  He then went home and make himself a drink with vodka and orange juice.  He reports that he took 2 sips of it when the back door open.  He got up to go check on his wife was outside of the house and reports that he felt dizzy, his legs gave out and he fell backwards hitting his head on the floor.  He denies LOC.  He is on Eliquis.  He denies neck pain, back pain, extremity pain.  He denies headache, palpitations, chest pain, shortness of breath both preceding or after the near syncopal event.  No recent illness, no dysuria, no diarrhea, no nausea, no vomiting.  Patient is now complaining of 8 out of 10 throbbing pain in the back of his head which has been constant since the fall and nonradiating.   Past Medical History:  Diagnosis Date  . Anterior urethral stricture   . Anxiety   . Arthritis    a. knees, hips, hands;  b. 11/2013 s/p L TKA @ [facility].  . Bile reflux gastritis   . Bulging lumbar disc   . BXO (balanitis xerotica obliterans)   . Complete heart block (HCC)    a. s/p MDT dual chamber (His bundle) pacemaker 01/2016 Dr Caryl Comes    . Depression   . DVT (deep venous thrombosis) ([place])   . Erosive esophagitis   . Gross hematuria   . Hyperlipemia   . Hypertension    borderline  . Internal hemorrhoids   . Phimosis   . Pulmonary embolism [facility])     Patient Active Problem List   Diagnosis Date Noted  . Complete heart block ([place]) 02/16/2016  . Bulging lumbar disc 05/11/2015  . Abdominal aortic atherosclerosis ([place]) 05/11/2015  . Depression 10/20/2014  . Onychomycosis 10/20/2014  . Anxiety 10/20/2014  . Mechanical complication of prosthetic hip implant ([place]) 07/13/2014  . Orthostatic hypotension 04/17/2013  . HTN (hypertension) 04/17/2013  . Dyslipidemia 04/17/2013  . GERD (gastroesophageal reflux disease) 04/17/2013  . Pulmonary embolism ([place]) 04/17/2013    Past Surgical History:  Procedure Laterality Date  . BUNIONECTOMY Bilateral 01/06/2015   Procedure: BUNIONECTOMY;  Surgeon: [name], MD;  Location: [facility] ORS;  Service: Orthopedics;  Laterality: Bilateral;  . CARDIAC CATHETERIZATION  ~ 2005   "once"  . CATARACT EXTRACTION W/ INTRAOCULAR LENS  IMPLANT, BILATERAL Bilateral ~ 2010  . COLONOSCOPY WITH PROPOFOL N/A 12/07/2015   Procedure: COLONOSCOPY WITH PROPOFOL;  Surgeon: Lollie Sails, MD;  Location: [facility] ENDOSCOPY;  Service: Endoscopy;  Laterality: N/A;  . EP IMPLANTABLE DEVICE N/A 02/16/2016   MDT dual chamber (His Bundle) pacemaker implanted by Dr Caryl Comes for intermittent complete heart block  . ESOPHAGOGASTRODUODENOSCOPY (EGD)  WITH PROPOFOL N/A 12/07/2015   Procedure: ESOPHAGOGASTRODUODENOSCOPY (EGD) WITH PROPOFOL;  Surgeon: Lollie Sails, MD;  Location: [facility] ENDOSCOPY;  Service: Endoscopy;  Laterality: N/A;  . ESOPHAGOGASTRODUODENOSCOPY (EGD) WITH PROPOFOL N/A 05/17/2017   Procedure: ESOPHAGOGASTRODUODENOSCOPY (EGD) WITH PROPOFOL;  Surgeon: Lollie Sails, MD;  Location: [facility] ENDOSCOPY;  Service: Endoscopy;  Laterality: N/A;  . HAMMER TOE SURGERY Bilateral 01/06/2015   Procedure: HAMMER  TOE CORRECTION;  Surgeon: [name], MD;  Location: [facility] ORS;  Service: Orthopedics;  Laterality: Bilateral;  . KNEE CARTILAGE SURGERY Left 1965   "football injury"  . Left Total Knee Arthroplasty     a. 11/2013 [facility].  Marland Kitchen PILONIDAL CYST EXCISION  1970's  . TOTAL HIP ARTHROPLASTY Right 2004  . TOTAL HIP ARTHROPLASTY Left 2006  . UPPER GI ENDOSCOPY      Prior to Admission medications   Medication Sig Start Date End Date Taking? Authorizing Provider  acetaminophen (TYLENOL) 500 MG tablet Take 2,000 mg by mouth daily as needed for moderate pain or headache.    [provider]  ascorbic acid (VITAMIN C) 250 MG tablet Take by mouth.    [provider]  b complex vitamins capsule Take 1 capsule by mouth daily.    [provider]  Cranberry 500 MG CAPS Take by mouth.    [provider]  Cyanocobalamin (B-12) 1000 MCG CAPS Take 1,000 mcg by mouth daily.     [provider]  ELIQUIS 5 MG TABS tablet TAKE 1 TABLET BY MOUTH TWICE A DAY 05/21/17   [name], MD  ferrous sulfate 325 (65 FE) MG tablet Take 325 mg by mouth daily with breakfast.    [provider]  flecainide (TAMBOCOR) 100 MG tablet Take 1 tablet (100 mg total) by mouth 2 (two) times daily. 02/20/17   Deboraha Sprang, MD  gabapentin (NEURONTIN) 400 MG capsule Take 1 capsule (400 mg total) by mouth 3 (three) times daily. 05/16/16   [name], MD  HYDROcodone-acetaminophen (NORCO/VICODIN) 5-325 MG tablet Take 1 tablet by mouth 2 (two) times daily at 10 AM and 5 PM. 04/24/17   [name], MD  hyoscyamine (LEVSIN, ANASPAZ) 0.125 MG tablet Take 0.125 mg by mouth every 6 (six) hours as needed for bladder spasms or cramping.  09/07/15   [provider]  LORazepam (ATIVAN) 1 MG tablet TAKE 1 TABLET BY MOUTH AT BEDTIME 02/26/17   [name], MD  Magnesium 400 MG CAPS Take 400 mg by mouth daily.    [provider]  Multiple Vitamin (MULTI-VITAMINS) TABS Take  by mouth.    [provider]  Omega-3 Fatty Acids (FISH OIL) 1200 MG CAPS Take 1,200 mg by mouth daily.    [provider]  oxymetazoline (AFRIN) 0.05 % nasal spray Place 2 sprays into both nostrils at bedtime as needed for congestion.    [provider]  pantoprazole (PROTONIX) 40 MG tablet TAKE 1 TABLET (40 MG TOTAL) BY MOUTH ONCE DAILY TAKE 30 MINS BEFORE MEAL 03/20/17   [provider]  QUEtiapine Fumarate (SEROQUEL XR) 150 MG 24 hr tablet Take 1 tablet (150 mg total) by mouth at bedtime. 05/16/16   [name], MD  simvastatin (ZOCOR) 20 MG tablet Take 1 tablet (20 mg total) by mouth daily. 05/16/16   [name], MD  solifenacin (VESICARE) 10 MG tablet Take 1 tablet (10 mg total) by mouth daily. 05/16/16   [name], MD  venlafaxine XR [facility]  XR) 75 MG 24 hr capsule Take 3 capsules (225 mg total) by mouth daily. 05/16/16   [name], MD  VESICARE 10 MG tablet TAKE 1 TABLET BY MOUTH EVERY DAY 05/25/17   [name], MD    Allergies Patient has no known allergies.  Family History  Problem Relation Age of Onset  . Stroke Father        deceased  . Diabetes Father   . Hypertension Father   . Alcoholism Mother        died in her 6's.  . Cancer Mother   . Cancer Sister   . Prostate cancer Paternal Grandfather     Social History Social History   Tobacco Use  . Smoking status: Never Smoker  . Smokeless tobacco: Never Used  Substance Use Topics  . Alcohol use: Yes    Alcohol/week: 0.0 oz    Comment: 2 DRINKS/MONTH  . Drug use: No    Review of Systems Constitutional: Negative for fever. + lightheadedness Eyes: Negative for visual changes. ENT: Negative for facial injury or neck injury Cardiovascular: Negative for chest injury. Respiratory: Negative for shortness of breath. Negative for chest wall injury. Gastrointestinal: Negative for abdominal pain or injury. Genitourinary: Negative for dysuria. Musculoskeletal:  Negative for back injury, negative for arm or leg pain. Skin: + scalp laceration  Neurological: + head injury.   ____________________________________________   PHYSICAL EXAM:  VITAL SIGNS: ED Triage Vitals  Enc Vitals Group     BP 06/11/17 1512 (!) 129/57     Pulse Rate 06/11/17 1512 80     Resp 06/11/17 1512 20     Temp --      Temp src --      SpO2 06/11/17 1512 90 %     Weight 06/11/17 1513 296 lb (134.3 kg)     Height 06/11/17 1513 5\' 11"  (1.803 m)     Head Circumference --      Peak Flow --      Pain Score 06/11/17 1512 7     Pain Loc --      Pain Edu? --      Excl. in [place]? --    Full spinal precautions maintained throughout the trauma exam. Constitutional: Alert and oriented. No acute distress. Does not appear intoxicated. HEENT Head: Normocephalic, parietal hematoma with dry blood Face: No facial bony tenderness. Stable midface Ears: No hemotympanum bilaterally. No Battle sign Eyes: No eye injury. PERRL. No raccoon eyes Nose: Nontender. No epistaxis. No rhinorrhea Mouth/Throat: Mucous membranes are moist. No oropharyngeal blood. No dental injury. Airway patent without stridor. Normal voice. Neck: no C-collar in place. No midline c-spine tenderness.  Cardiovascular: Normal rate, regular rhythm. Normal and symmetric distal pulses are present in all extremities. Pulmonary/Chest: Chest wall is stable and nontender to palpation/compression. Normal respiratory effort. Breath sounds are normal. No crepitus.  Abdominal: Soft, nontender, non distended. Musculoskeletal: Nontender with normal full range of motion in all extremities. No deformities. No thoracic or lumbar midline spinal tenderness. Pelvis is stable. Skin: Skin is warm, dry and intact. No abrasions or contutions. Psychiatric: Speech and behavior are appropriate. Neurological: Normal speech and language. Moves all extremities to command. No gross focal neurologic deficits are appreciated.  Glascow Coma Score: 4  - Opens eyes on own 6 - Follows simple motor commands 5 - Alert and oriented GCS: 15  ____________________________________________   LABS (all labs ordered are listed, but only abnormal results are displayed)  Labs Reviewed  CBC -  Abnormal; Notable for the following components:      Result Value   WBC 14.5 (*)    All other components within normal limits  COMPREHENSIVE METABOLIC PANEL - Abnormal; Notable for the following components:   Chloride 98 (*)    Glucose, Bld 121 (*)    BUN 44 (*)    Creatinine, Ser 1.88 (*)    GFR calc non Af Amer 34 (*)    GFR calc Af Amer 39 (*)    All other components within normal limits  ETHANOL  TROPONIN I  URINALYSIS, COMPLETE (UACMP) WITH MICROSCOPIC   ____________________________________________  EKG  ED ECG REPORT I, Rudene Re, the attending physician, personally viewed and interpreted this ECG.  Atrial paced rhythm, rate of 81, RBBB, LAFB, normal QTC, left axis deviation, no ST elevations or depressions.  Unchanged from ____________________________________________  RADIOLOGY  I have personally reviewed the images performed during this visit and I agree with the Radiologist's read.   Interpretation by Radiologist:  Ct Head Wo Contrast  Result Date: 06/11/2017 CLINICAL DATA:  Fall, dizziness, posterior scalp laceration, on Eliquis EXAM: CT HEAD WITHOUT CONTRAST CT CERVICAL SPINE WITHOUT CONTRAST TECHNIQUE: Multidetector CT imaging of the head and cervical spine was performed following the standard protocol without intravenous contrast. Multiplanar CT image reconstructions of the cervical spine were also generated. COMPARISON:  None. FINDINGS: CT HEAD FINDINGS Brain: No evidence of acute infarction, hemorrhage, hydrocephalus, extra-axial collection or mass lesion/mass effect. Subcortical white matter and periventricular small vessel ischemic changes. Vascular: Intracranial atherosclerosis. Skull: Normal. Negative for fracture or  focal lesion. Sinuses/Orbits: The visualized paranasal sinuses are essentially clear. The mastoid air cells are unopacified. Other: Soft tissue laceration overlying the right parietal scalp (series 4/image 67). CT CERVICAL SPINE FINDINGS Alignment: Mild straightening of the cervical spine, likely positional. Skull base and vertebrae: No acute fracture. No primary bone lesion or focal pathologic process. Soft tissues and spinal canal: No prevertebral fluid or swelling. No visible canal hematoma. Disc levels: Mild degenerative changes of the mid cervical spine. Spinal canal is patent. Upper chest: Visualized lung apices are clear. Other: Visualized thyroid is unremarkable. IMPRESSION: Soft tissue laceration overlying the right parietal scalp. No evidence of calvarial fracture. No evidence of acute intracranial abnormality. Small vessel ischemic changes. No evidence of traumatic injury to the cervical spine. Mild degenerative changes. Electronically Signed   By: Julian Hy M.D.   On: 06/11/2017 15:46   Ct Cervical Spine Wo Contrast  Result Date: 06/11/2017 CLINICAL DATA:  Fall, dizziness, posterior scalp laceration, on Eliquis EXAM: CT HEAD WITHOUT CONTRAST CT CERVICAL SPINE WITHOUT CONTRAST TECHNIQUE: Multidetector CT imaging of the head and cervical spine was performed following the standard protocol without intravenous contrast. Multiplanar CT image reconstructions of the cervical spine were also generated. COMPARISON:  None. FINDINGS: CT HEAD FINDINGS Brain: No evidence of acute infarction, hemorrhage, hydrocephalus, extra-axial collection or mass lesion/mass effect. Subcortical white matter and periventricular small vessel ischemic changes. Vascular: Intracranial atherosclerosis. Skull: Normal. Negative for fracture or focal lesion. Sinuses/Orbits: The visualized paranasal sinuses are essentially clear. The mastoid air cells are unopacified. Other: Soft tissue laceration overlying the right parietal  scalp (series 4/image 67). CT CERVICAL SPINE FINDINGS Alignment: Mild straightening of the cervical spine, likely positional. Skull base and vertebrae: No acute fracture. No primary bone lesion or focal pathologic process. Soft tissues and spinal canal: No prevertebral fluid or swelling. No visible canal hematoma. Disc levels: Mild degenerative changes of the mid cervical spine. Spinal canal is  patent. Upper chest: Visualized lung apices are clear. Other: Visualized thyroid is unremarkable. IMPRESSION: Soft tissue laceration overlying the right parietal scalp. No evidence of calvarial fracture. No evidence of acute intracranial abnormality. Small vessel ischemic changes. No evidence of traumatic injury to the cervical spine. Mild degenerative changes. Electronically Signed   By: Julian Hy M.D.   On: 06/11/2017 15:46     ____________________________________________   PROCEDURES  Procedure(s) performed: yes .Marland KitchenLaceration Repair Date/Time: 06/11/2017 6:15 PM Performed by: Rudene Re, MD Authorized by: Rudene Re, MD   Consent:    Consent obtained:  Verbal   Consent given by:  Patient   Risks discussed:  Infection, pain, retained foreign body, poor cosmetic result and poor wound healing Anesthesia (see MAR for exact dosages):    Anesthesia method:  None Laceration details:    Location:  Scalp   Scalp location:  R parietal   Length (cm):  2 Repair type:    Repair type:  Simple Pre-procedure details:    Preparation:  Patient was prepped and draped in usual sterile fashion and imaging obtained to evaluate for foreign bodies Exploration:    Hemostasis achieved with:  Direct pressure   Wound exploration: entire depth of wound probed and visualized     Wound extent: no fascia violation noted, no foreign bodies/material noted and no underlying fracture noted     Contaminated: no   Treatment:    Area cleansed with:  Saline   Amount of cleaning:  Extensive   Irrigation  solution:  Sterile saline   Visualized foreign bodies/material removed: no   Skin repair:    Repair method:  Staples   Number of staples:  3 Approximation:    Approximation:  Close Post-procedure details:    Dressing:  Open (no dressing)   Patient tolerance of procedure:  Tolerated well, no immediate complications   Critical Care performed:  None ____________________________________________   INITIAL IMPRESSION / ASSESSMENT AND PLAN / ED COURSE  72 y.o. male with a history of complete heart block status post Medtronic pacemaker, pulmonary embolism on Eliquis, atrial fibrillation, hypertension, hyperlipidemia who presents for evaluation of near syncope/ fall/ head trauma.  GCS of 15, neurologically intact, hemodynamically stable, patient does have a hematoma in the parietal region with dried blood over it but no signs or symptoms of basilar skull fracture.  Patient is on Eliquis.  Head CT and CT cervical spine have been ordered to rule out traumatic injury.  Patient reports few days of lightheadedness.  Will check CBC to rule out anemia, CMP to rule out dehydration or electrolyte abnormalities or acute kidney injury, EKG and troponin to rule out ischemia, will interrogate pacemaker to rule out arrhythmia.  We will do orthostatic vital signs.  Will give IV fluids since patient was 4 hours in the sun today and is probably slightly dehydrated.  Will monitor on telemetry.  Per review of Epic last tetanus was in 2006, will give booster.     _________________________ 5:48 PM on 06/11/2017 -----------------------------------------  Work-up consistent with dehydration, acute kidney injury, patient was orthostatic positive.  Pacemaker was interrogated with no arrhythmic events.  EKG shows no current arrhythmias.  CT head and cervical spine with no acute injury.  Laceration repaired per procedure note above.  Due to near syncopal events and significant dehydration with new acute kidney injury decision  was made to admit patient.  Patient received a liter of normal saline in the emergency room.  Discussed with Dr. Posey Pronto for admission.3  As part of my medical decision making, I reviewed the following data within the [place] notes reviewed and incorporated, Labs reviewed , EKG interpreted , Old EKG reviewed, Old chart reviewed, Radiograph reviewed , Discussed with admitting physician , Notes from prior ED visits and [place] Controlled Substance Database    Pertinent labs & imaging results that were available during my care of the patient were reviewed by me and considered in my medical decision making (see chart for details).    ____________________________________________   FINAL CLINICAL IMPRESSION(S) / ED DIAGNOSES  Final diagnoses:  Near syncope  Fall, initial encounter  Laceration of scalp, initial encounter  AKI (acute kidney injury) ([place])  Dehydration      NEW MEDICATIONS STARTED DURING THIS VISIT:  ED Discharge Orders    None       Note:  This document was prepared using Dragon voice recognition software and may include unintentional dictation errors.    Alfred Levins, [place], MD 06/11/17 1816

## 2017-06-11 NOTE — ED Notes (Signed)
Pt hypotensive while lying flat, did not attempt repeat orthostatics at this time.

## 2017-06-11 NOTE — Progress Notes (Signed)
Noted UA, start rocephin and send Ur cx.

## 2017-06-11 NOTE — ED Notes (Signed)
After sitting during orthostatic VS, pt c/o increased weakness.

## 2017-06-11 NOTE — H&P (Signed)
[facility] [place] NAME: Nicholas [name]    MR#:  [id number]  DATE OF BIRTH:  25-Nov-1945  DATE OF ADMISSION:  06/11/2017  PRIMARY CARE PHYSICIAN: [name], MD   REQUESTING/REFERRING PHYSICIAN: veronese  CHIEF COMPLAINT:   Chief Complaint  Patient presents with  . Near Syncope  . Fall  . Head Injury    HISTORY OF PRESENT ILLNESS: Nicholas [name]  is a 72 y.o. male with a known history of he will follow stricture, arthritis, bulging lumbar disks, complete heart block status post pacemaker, DVT on Eliquis, esophagitis, hypertension, pulmonary embolism- was playing golf today and did not drink enough liquids started feeling dizzy and suddenly he is legs gave away and he had a fall. He hit his head on the ground and denies any passing out episode. In ER his CT scan of the head and cervical spine are negative without any major injuries except for superficial scalp injury. He was noted to have acute renal failure and significant orthostatic drop in his blood pressure. His pacemaker interrogation was done and it did not show any abnormal events.   PAST MEDICAL HISTORY:   Past Medical History:  Diagnosis Date  . Anterior urethral stricture   . Anxiety   . Arthritis    a. knees, hips, hands;  b. 11/2013 s/p L TKA @ [facility].  . Bile reflux gastritis   . Bulging lumbar disc   . BXO (balanitis xerotica obliterans)   . 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 Depression   . DVT (deep venous thrombosis) ([place])   . Erosive esophagitis   . Gross hematuria   . Hyperlipemia   . Hypertension    borderline  . Internal hemorrhoids   . Phimosis   . Pulmonary embolism ([place])     PAST SURGICAL HISTORY:  Past Surgical History:  Procedure Laterality Date  . BUNIONECTOMY Bilateral 01/06/2015   Procedure: BUNIONECTOMY;  Surgeon: [name], MD;  Location: [facility] ORS;  Service: Orthopedics;   Laterality: Bilateral;  . CARDIAC CATHETERIZATION  ~ 2005   "once"  . CATARACT EXTRACTION W/ INTRAOCULAR LENS  IMPLANT, BILATERAL Bilateral ~ 2010  . COLONOSCOPY WITH PROPOFOL N/A 12/07/2015   Procedure: COLONOSCOPY WITH PROPOFOL;  Surgeon: Lollie Sails, MD;  Location: [facility] ENDOSCOPY;  Service: Endoscopy;  Laterality: N/A;  . EP IMPLANTABLE DEVICE N/A 02/16/2016   MDT dual chamber (His Bundle) pacemaker implanted by Dr Caryl Comes for intermittent complete heart block  . ESOPHAGOGASTRODUODENOSCOPY (EGD) WITH PROPOFOL N/A 12/07/2015   Procedure: ESOPHAGOGASTRODUODENOSCOPY (EGD) WITH PROPOFOL;  Surgeon: Lollie Sails, MD;  Location: [facility] ENDOSCOPY;  Service: Endoscopy;  Laterality: N/A;  . ESOPHAGOGASTRODUODENOSCOPY (EGD) WITH PROPOFOL N/A 05/17/2017   Procedure: ESOPHAGOGASTRODUODENOSCOPY (EGD) WITH PROPOFOL;  Surgeon: Lollie Sails, MD;  Location: [facility] ENDOSCOPY;  Service: Endoscopy;  Laterality: N/A;  . HAMMER TOE SURGERY Bilateral 01/06/2015   Procedure: HAMMER TOE CORRECTION;  Surgeon: [name], MD;  Location: [facility] ORS;  Service: Orthopedics;  Laterality: Bilateral;  . KNEE CARTILAGE SURGERY Left 1965   "football injury"  . Left Total Knee Arthroplasty     a. 11/2013 [facility].  Marland Kitchen PILONIDAL CYST EXCISION  1970's  . TOTAL HIP ARTHROPLASTY Right 2004  . TOTAL HIP ARTHROPLASTY Left 2006  . UPPER GI ENDOSCOPY      SOCIAL HISTORY:  Social History   Tobacco Use  . Smoking status: Never Smoker  .  Smokeless tobacco: Never Used  Substance Use Topics  . Alcohol use: Yes    Alcohol/week: 0.0 oz    Comment: 2 DRINKS/MONTH    FAMILY HISTORY:  Family History  Problem Relation Age of Onset  . Stroke Father        deceased  . Diabetes Father   . Hypertension Father   . Alcoholism Mother        died in her 63's.  . Cancer Mother   . Cancer Sister   . Prostate cancer Paternal Grandfather     DRUG ALLERGIES: No Known Allergies  REVIEW OF SYSTEMS:   CONSTITUTIONAL: No  fever, fatigue or weakness.  EYES: No blurred or double vision.  EARS, NOSE, AND THROAT: No tinnitus or ear pain.  RESPIRATORY: No cough, shortness of breath, wheezing or hemoptysis.  CARDIOVASCULAR: No chest pain, orthopnea, edema.  GASTROINTESTINAL: No nausea, vomiting, diarrhea or abdominal pain.  GENITOURINARY: No dysuria, hematuria.  ENDOCRINE: No polyuria, nocturia,  HEMATOLOGY: No anemia, easy bruising or bleeding SKIN: No rash or lesion. MUSCULOSKELETAL: No joint pain or arthritis.   NEUROLOGIC: No tingling, numbness, weakness. Felt dizzi today. PSYCHIATRY: No anxiety or depression.   MEDICATIONS AT HOME:  Prior to Admission medications   Medication Sig Start Date End Date Taking? Authorizing Provider  acetaminophen (TYLENOL) 500 MG tablet Take 2,000 mg by mouth daily as needed for moderate pain or headache.    [provider]  ascorbic acid (VITAMIN C) 250 MG tablet Take by mouth.    [provider]  b complex vitamins capsule Take 1 capsule by mouth daily.    [provider]  Cranberry 500 MG CAPS Take by mouth.    [provider]  Cyanocobalamin (B-12) 1000 MCG CAPS Take 1,000 mcg by mouth daily.     [provider]  ELIQUIS 5 MG TABS tablet TAKE 1 TABLET BY MOUTH TWICE A DAY 05/21/17   [name], MD  ferrous sulfate 325 (65 FE) MG tablet Take 325 mg by mouth daily with breakfast.    [provider]  flecainide (TAMBOCOR) 100 MG tablet Take 1 tablet (100 mg total) by mouth 2 (two) times daily. 02/20/17   Deboraha Sprang, MD  gabapentin (NEURONTIN) 400 MG capsule Take 1 capsule (400 mg total) by mouth 3 (three) times daily. 05/16/16   [name], MD  HYDROcodone-acetaminophen (NORCO/VICODIN) 5-325 MG tablet Take 1 tablet by mouth 2 (two) times daily at 10 AM and 5 PM. 04/24/17   [name], MD  hyoscyamine (LEVSIN, ANASPAZ) 0.125 MG tablet Take 0.125 mg by mouth every 6 (six) hours as needed for bladder spasms or  cramping.  09/07/15   [provider]  LORazepam (ATIVAN) 1 MG tablet TAKE 1 TABLET BY MOUTH AT BEDTIME 02/26/17   [name], MD  Magnesium 400 MG CAPS Take 400 mg by mouth daily.    [provider]  Multiple Vitamin (MULTI-VITAMINS) TABS Take by mouth.    [provider]  Omega-3 Fatty Acids (FISH OIL) 1200 MG CAPS Take 1,200 mg by mouth daily.    [provider]  oxymetazoline (AFRIN) 0.05 % nasal spray Place 2 sprays into both nostrils at bedtime as needed for congestion.    [provider]  pantoprazole (PROTONIX) 40 MG tablet TAKE 1 TABLET (40 MG TOTAL) BY MOUTH ONCE DAILY TAKE 30 MINS BEFORE MEAL 03/20/17   [provider]  QUEtiapine Fumarate (SEROQUEL XR) 150 MG 24 hr tablet  Take 1 tablet (150 mg total) by mouth at bedtime. 05/16/16   [name], MD  simvastatin (ZOCOR) 20 MG tablet Take 1 tablet (20 mg total) by mouth daily. 05/16/16   [name], MD  solifenacin (VESICARE) 10 MG tablet Take 1 tablet (10 mg total) by mouth daily. 05/16/16   [name], MD  venlafaxine XR (EFFEXOR XR) 75 MG 24 hr capsule Take 3 capsules (225 mg total) by mouth daily. 05/16/16   [name], MD  VESICARE 10 MG tablet TAKE 1 TABLET BY MOUTH EVERY DAY 05/25/17   [name], MD      PHYSICAL EXAMINATION:   VITAL SIGNS: Blood pressure 132/68, pulse 70, resp. rate 17, height 5\' 11"  (1.803 m), weight 134.3 kg (296 lb), SpO2 95 %.  GENERAL:  72 y.o.-year-old patient lying in the bed with no acute distress.  EYES: Pupils equal, round, reactive to light and accommodation. No scleral icterus. Extraocular muscles intact.  HEENT: Head - as a laceration on his scar on right side parietal area, normocephalic. Oropharynx and nasopharynx clear.  NECK:  Supple, no jugular venous distention. No thyroid enlargement, no tenderness.  LUNGS: Normal breath sounds bilaterally, no wheezing, rales,rhonchi or crepitation. No use of accessory muscles  of respiration.  CARDIOVASCULAR: S1, S2 normal. No murmurs, rubs, or gallops.  ABDOMEN: Soft, nontender, nondistended. Bowel sounds present. No organomegaly or mass.  EXTREMITIES: No pedal edema, cyanosis, or clubbing.  NEUROLOGIC: Cranial nerves II through XII are intact. Muscle strength 5/5 in all extremities. Sensation intact. Gait not checked.  PSYCHIATRIC: The patient is alert and oriented x 3.  SKIN: No obvious rash, lesion, or ulcer.   LABORATORY PANEL:   CBC Recent Labs  Lab 06/11/17 1609  WBC 14.5*  HGB 15.8  HCT 46.0  PLT 182  MCV 92.8  MCH 31.8  MCHC 34.3  RDW 13.7   ------------------------------------------------------------------------------------------------------------------  Chemistries  Recent Labs  Lab 06/11/17 1609  NA 135  K 4.7  CL 98*  CO2 27  GLUCOSE 121*  BUN 44*  CREATININE 1.88*  CALCIUM 9.3  AST 33  ALT 21  ALKPHOS 45  BILITOT 1.2   ------------------------------------------------------------------------------------------------------------------ estimated creatinine clearance is 49.7 mL/min (A) (by C-G formula based on SCr of 1.88 mg/dL (H)). ------------------------------------------------------------------------------------------------------------------ No results for input(s): TSH, T4TOTAL, T3FREE, THYROIDAB in the last 72 hours.  Invalid input(s): FREET3   Coagulation profile No results for input(s): INR, PROTIME in the last 168 hours. ------------------------------------------------------------------------------------------------------------------- No results for input(s): DDIMER in the last 72 hours. -------------------------------------------------------------------------------------------------------------------  Cardiac Enzymes Recent Labs  Lab 06/11/17 1609  TROPONINI <0.03   ------------------------------------------------------------------------------------------------------------------ Invalid input(s):  POCBNP  ---------------------------------------------------------------------------------------------------------------  Urinalysis    Component Value Date/Time   COLORURINE Amber 02/16/2014 1018   COLORURINE YELLOW 04/17/2013 1449   APPEARANCEUR Clear 03/23/2016 1431   LABSPEC 1.023 02/16/2014 1018   PHURINE 5.0 02/16/2014 1018   PHURINE 5.0 04/17/2013 1449   GLUCOSEU Negative 03/23/2016 1431   GLUCOSEU Negative 02/16/2014 1018   HGBUR Negative 02/16/2014 1018   HGBUR NEGATIVE 04/17/2013 1449   BILIRUBINUR Negative 03/23/2016 1431   BILIRUBINUR Negative 02/16/2014 1018   KETONESUR Trace 02/16/2014 1018   KETONESUR NEGATIVE 04/17/2013 1449   PROTEINUR 1+ (A) 03/23/2016 1431   PROTEINUR 30 mg/dL 02/16/2014 1018   PROTEINUR NEGATIVE 04/17/2013 1449   UROBILINOGEN 0.2 04/17/2013 1449   NITRITE Negative 03/23/2016 1431   NITRITE Negative 02/16/2014 1018   NITRITE NEGATIVE 04/17/2013 1449   LEUKOCYTESUR 1+ (A)  03/23/2016 1431   LEUKOCYTESUR 2+ 02/16/2014 1018     RADIOLOGY: Ct Head Wo Contrast  Result Date: 06/11/2017 CLINICAL DATA:  Fall, dizziness, posterior scalp laceration, on Eliquis EXAM: CT HEAD WITHOUT CONTRAST CT CERVICAL SPINE WITHOUT CONTRAST TECHNIQUE: Multidetector CT imaging of the head and cervical spine was performed following the standard protocol without intravenous contrast. Multiplanar CT image reconstructions of the cervical spine were also generated. COMPARISON:  None. FINDINGS: CT HEAD FINDINGS Brain: No evidence of acute infarction, hemorrhage, hydrocephalus, extra-axial collection or mass lesion/mass effect. Subcortical white matter and periventricular small vessel ischemic changes. Vascular: Intracranial atherosclerosis. Skull: Normal. Negative for fracture or focal lesion. Sinuses/Orbits: The visualized paranasal sinuses are essentially clear. The mastoid air cells are unopacified. Other: Soft tissue laceration overlying the right parietal scalp (series  4/image 67). CT CERVICAL SPINE FINDINGS Alignment: Mild straightening of the cervical spine, likely positional. Skull base and vertebrae: No acute fracture. No primary bone lesion or focal pathologic process. Soft tissues and spinal canal: No prevertebral fluid or swelling. No visible canal hematoma. Disc levels: Mild degenerative changes of the mid cervical spine. Spinal canal is patent. Upper chest: Visualized lung apices are clear. Other: Visualized thyroid is unremarkable. IMPRESSION: Soft tissue laceration overlying the right parietal scalp. No evidence of calvarial fracture. No evidence of acute intracranial abnormality. Small vessel ischemic changes. No evidence of traumatic injury to the cervical spine. Mild degenerative changes. Electronically Signed   By: Julian Hy M.D.   On: 06/11/2017 15:46   Ct Cervical Spine Wo Contrast  Result Date: 06/11/2017 CLINICAL DATA:  Fall, dizziness, posterior scalp laceration, on Eliquis EXAM: CT HEAD WITHOUT CONTRAST CT CERVICAL SPINE WITHOUT CONTRAST TECHNIQUE: Multidetector CT imaging of the head and cervical spine was performed following the standard protocol without intravenous contrast. Multiplanar CT image reconstructions of the cervical spine were also generated. COMPARISON:  None. FINDINGS: CT HEAD FINDINGS Brain: No evidence of acute infarction, hemorrhage, hydrocephalus, extra-axial collection or mass lesion/mass effect. Subcortical white matter and periventricular small vessel ischemic changes. Vascular: Intracranial atherosclerosis. Skull: Normal. Negative for fracture or focal lesion. Sinuses/Orbits: The visualized paranasal sinuses are essentially clear. The mastoid air cells are unopacified. Other: Soft tissue laceration overlying the right parietal scalp (series 4/image 67). CT CERVICAL SPINE FINDINGS Alignment: Mild straightening of the cervical spine, likely positional. Skull base and vertebrae: No acute fracture. No primary bone lesion or  focal pathologic process. Soft tissues and spinal canal: No prevertebral fluid or swelling. No visible canal hematoma. Disc levels: Mild degenerative changes of the mid cervical spine. Spinal canal is patent. Upper chest: Visualized lung apices are clear. Other: Visualized thyroid is unremarkable. IMPRESSION: Soft tissue laceration overlying the right parietal scalp. No evidence of calvarial fracture. No evidence of acute intracranial abnormality. Small vessel ischemic changes. No evidence of traumatic injury to the cervical spine. Mild degenerative changes. Electronically Signed   By: Julian Hy M.D.   On: 06/11/2017 15:46    EKG: Orders placed or performed during the hospital encounter of 06/11/17  . ED EKG  . ED EKG  . EKG 12-Lead  . EKG 12-Lead    IMPRESSION AND PLAN:  * Fall- syncopal episode   Likely orthostatic blood pressure drop- due to severe dehydration.    Pacemaker interrogation did not show any abnormalities.   Continue IV fluid and monitor his orthostatic vitals.   Urinalysis is still not collected, spoke to patient and ER nurse.  * Acute renal failure   Due to severe dehydration,  continue IV fluid and monitor.  * history of DVT and PE   Continue anticoagulation as the bleeding is minor, staples applied by ER physician on his scalp, follow up with primary care in 1-2 weeks to remove the staples.  * urethral obstruction   Continue medications as home.    All the records are reviewed and case discussed with ED provider. Management plans discussed with the patient, family and they are in agreement.  CODE STATUS:full code Code Status History    Date Active Date Inactive Code Status Order ID Comments User Context   02/16/2016 1846 02/17/2016 1356 Full Code [id number]  Deboraha Sprang, MD Inpatient   04/17/2013 2107 04/21/2013 1552 Full Code [id number]  Jonetta Osgood, MD Inpatient   04/17/2013 1520 04/17/2013 2107 Full Code [id number]  Germain Osgood, PA-C ED    04/07/2013 1721 04/11/2013 1949 Full Code [id number]  Rush Farmer, MD Inpatient     atient's wife was present in the room during my visit.  TOTAL TIME TAKING CARE OF THIS PATIENT: 45 minutes.    Vaughan Basta M.D on 06/11/2017   Between 7am to 6pm - Pager - [phone]  After 6pm go to [email or web address] - password [email or web address] [facility]  Office  [phone]  CC: Primary care physician; [name], MD   Note: This dictation was prepared with Dragon dictation along with smaller phrase technology. Any transcriptional errors that result from this process are unintentional.

## 2017-06-11 NOTE — ED Notes (Signed)
Pt placed on 2L O2/[place] for SaO2 that dip into the high 80's, 88% on room air.

## 2017-06-11 NOTE — ED Notes (Signed)
Patient transported to CT 

## 2017-06-11 NOTE — ED Notes (Signed)
Requires support to stand.

## 2017-06-12 DIAGNOSIS — E860 Dehydration: Secondary | ICD-10-CM | POA: Diagnosis not present

## 2017-06-12 DIAGNOSIS — I951 Orthostatic hypotension: Secondary | ICD-10-CM | POA: Diagnosis not present

## 2017-06-12 DIAGNOSIS — R55 Syncope and collapse: Secondary | ICD-10-CM | POA: Diagnosis not present

## 2017-06-12 DIAGNOSIS — N179 Acute kidney failure, unspecified: Secondary | ICD-10-CM | POA: Diagnosis not present

## 2017-06-12 LAB — BASIC METABOLIC PANEL
ANION GAP: 6 (ref 5–15)
BUN: 31 mg/dL — ABNORMAL HIGH (ref 6–20)
CHLORIDE: 107 mmol/L (ref 101–111)
CO2: 26 mmol/L (ref 22–32)
Calcium: 8.3 mg/dL — ABNORMAL LOW (ref 8.9–10.3)
Creatinine, Ser: 0.95 mg/dL (ref 0.61–1.24)
GFR calc non Af Amer: >60 mL/min (ref >60)
Glucose, Bld: 125 mg/dL — ABNORMAL HIGH (ref 65–99)
POTASSIUM: 4.1 mmol/L (ref 3.5–5.1)
SODIUM: 139 mmol/L (ref 135–145)

## 2017-06-12 LAB — CBC
HCT: 39.7 % — ABNORMAL LOW (ref 40.0–52.0)
HEMOGLOBIN: 13.6 g/dL (ref 13.0–18.0)
MCH: 32 pg (ref 26.0–34.0)
MCHC: 34.3 g/dL (ref 32.0–36.0)
MCV: 93.4 fL (ref 80.0–100.0)
PLATELETS: 142 10*3/uL — AB (ref 150–440)
RBC: 4.25 MIL/uL — ABNORMAL LOW (ref 4.40–5.90)
RDW: 13.6 % (ref 11.5–14.5)
WBC: 8.4 10*3/uL (ref 3.8–10.6)

## 2017-06-12 MED ORDER — CEFUROXIME AXETIL 250 MG PO TABS: 250.0000 mg | ORAL_TABLET | Freq: Two times a day (BID) | ORAL | 0 refills | Status: AC

## 2017-06-12 NOTE — Care Management Obs Status (Signed)
[place] NOTIFICATION   Patient Details  Name: Nicholas [name] MRN: [id number] Date of Birth: [birth year]   Medicare Observation Status Notification Given:  No(admitted obs less than 24 hours)    Beverly Sessions, RN 06/12/2017, 2:33 PM

## 2017-06-12 NOTE — Plan of Care (Signed)
  Problem: Clinical Measurements: Goal: Diagnostic test results will improve Outcome: Progressing   Problem: Pain Managment: Goal: General experience of comfort will improve Outcome: Progressing   Problem: Safety: Goal: Ability to remain free from injury will improve Outcome: Progressing

## 2017-06-12 NOTE — Progress Notes (Addendum)
MD aware of orthostatic vs/ pt asymptomatic- no dizziness reported/ pt ambulated around nursing station/ tolerated well.   Discharge instructions explained to pt and pts wife/ verbalized an understating/ iv and tele removed/ will transport off unit via wheelchair.

## 2017-06-13 ENCOUNTER — Telehealth: Payer: Self-pay

## 2017-06-13 NOTE — Telephone Encounter (Signed)
Transition Care Management Follow-up Telephone Call   Date discharged?06/12/2017   How have you been since you were released from the hospital? "having a little headache but not bad"   Do you understand why you were in the hospital? yes   Do you understand the discharge instructions? yes   Where were you discharged to? Home    Items Reviewed:  Medications reviewed: yes  Allergies reviewed: yes  Dietary changes reviewed: yes  Referrals reviewed: yes   Functional Questionnaire:   Activities of Daily Living (ADLs):   He states they are independent in the following: ambulation, bathing and hygiene, feeding, continence, grooming, toileting and dressing States they require assistance with the following: none   Any transportation issues/concerns?: no   Any patient concerns? no   Confirmed importance and date/time of follow-up visits scheduled yes  Provider Appointment booked with Dr.Johnson on 06/22/2017 at 8:45am for staple removal   Confirmed with patient if condition begins to worsen call PCP or go to the ER.  Patient was given the office number and encouraged to call back with question or concerns.  : yes

## 2017-06-13 NOTE — Discharge Summary (Signed)
[facility] NAME: Nicholas [name]    MR#:  [id number]  DATE OF BIRTH:  26-Nov-1945  DATE OF ADMISSION:  06/11/2017 ADMITTING PHYSICIAN: Vaughan Basta, MD  DATE OF DISCHARGE: 06/12/2017  1:01 PM  PRIMARY CARE PHYSICIAN: [name], MD    ADMISSION DIAGNOSIS:  Dehydration [E86.0] AKI (acute kidney injury) ([place]) [N17.9] Near syncope [R55] Fall, initial encounter [W19.XXXA] Laceration of scalp, initial encounter [S01.01XA]  DISCHARGE DIAGNOSIS:  Principal Problem:   Syncopal episodes Active Problems:   Syncope   SECONDARY DIAGNOSIS:   Past Medical History:  Diagnosis Date  . Anterior urethral stricture   . Anxiety   . Arthritis    a. knees, hips, hands;  b. 11/2013 s/p L TKA @ [facility].  . Bile reflux gastritis   . Bulging lumbar disc   . BXO (balanitis xerotica obliterans)   . 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 Depression   . DVT (deep venous thrombosis) ([place])   . Erosive esophagitis   . Gross hematuria   . Hyperlipemia   . Hypertension    borderline  . Internal hemorrhoids   . Phimosis   . Pulmonary embolism [facility])     HOSPITAL COURSE:   72 year old male with past medical history of hypertension, hyperlipidemia, history of pulmonary embolism, obesity, history of complete heart block status post pacemaker, GERD, anxiety, osteoarthritis who presented to the hospital due to a syncopal episode.  1.  Syncope-this was likely secondary to dehydration and hypotension.  Patient was observed overnight in the hospital, had 3 sets of cardiac markers checked which were negative.  He had no arrhythmias on telemetry. -He was hypotensive but not orthostatic.  His pacemaker was also interrogated which showed no acute abnormalities.  He has had no further episodes of syncope while in the hospital and therefore not being discharged home. -Patient apparently played some  golf in the hot weather with not adequate hydration which is probably what led to the patient's syncope.  2.  Acute kidney injury-secondary to the hypotension and dehydration.  Patient was given IV fluids, his creatinine has improved is not baseline.  3.  History of atrial fibrillation- patient is rate controlled.  He will continue his flecainide. - he will also resume his Eliquis  4.  UTI-this is based off a urinalysis on admission.  Patient had no acute symptoms although.  He was empirically given some IV ceftriaxone and being discharged on oral Ceftin for additional 3 days.  5.  Hyperlipidemia-patient will continue his simvastatin.  6.  GERD-patient will continue his Protonix.  7.  Neuropathy-patient will continue his gabapentin.  DISCHARGE CONDITIONS:   Stable  CONSULTS OBTAINED:    DRUG ALLERGIES:  No Known Allergies  DISCHARGE MEDICATIONS:   Allergies as of 06/12/2017   No Known Allergies     Medication List    TAKE these medications   acetaminophen 500 MG tablet Commonly known as:  TYLENOL Take 2,000 mg by mouth daily as needed for moderate pain or headache.   ascorbic acid 250 MG tablet Commonly known as:  VITAMIN C Take 250 mg by mouth daily.   b complex vitamins capsule Take 1 capsule by mouth daily.   B-12 1000 MCG Caps Take 1,000 mcg by mouth daily.   cefUROXime 250 MG tablet Commonly known as:  CEFTIN Take 1 tablet (250 mg total) by mouth 2 (two) times  daily with a meal for 3 days.   Cranberry 500 MG Caps Take 1 capsule by mouth daily.   ELIQUIS 5 MG Tabs tablet Generic drug:  apixaban TAKE 1 TABLET BY MOUTH TWICE A DAY   ferrous sulfate 325 (65 FE) MG tablet Take 325 mg by mouth daily with breakfast.   Fish Oil 1200 MG Caps Take 1,200 mg by mouth daily.   flecainide 100 MG tablet Commonly known as:  TAMBOCOR Take 1 tablet (100 mg total) by mouth 2 (two) times daily.   gabapentin 400 MG capsule Commonly known as:  NEURONTIN Take 1  capsule (400 mg total) by mouth 3 (three) times daily.   HYDROcodone-acetaminophen 5-325 MG tablet Commonly known as:  NORCO/VICODIN Take 1 tablet by mouth 2 (two) times daily at 10 AM and 5 PM.   hyoscyamine 0.125 MG tablet Commonly known as:  LEVSIN, ANASPAZ Take 0.125 mg by mouth every 6 (six) hours as needed for bladder spasms or cramping.   LORazepam 1 MG tablet Commonly known as:  ATIVAN TAKE 1 TABLET BY MOUTH AT BEDTIME   Magnesium 400 MG Tabs Take 400 mg by mouth daily.   Melatonin 5 MG Tabs Take 10 mg by mouth at bedtime.   MULTI-VITAMINS Tabs Take 1 tablet by mouth daily.   oxymetazoline 0.05 % nasal spray Commonly known as:  AFRIN Place 2 sprays into both nostrils at bedtime as needed for congestion.   pantoprazole 40 MG tablet Commonly known as:  PROTONIX TAKE 1 TABLET (40 MG TOTAL) BY MOUTH ONCE DAILY TAKE 30 MINS BEFORE MEAL   QUEtiapine Fumarate 150 MG 24 hr tablet Commonly known as:  SEROQUEL XR Take 1 tablet (150 mg total) by mouth at bedtime.   simvastatin 20 MG tablet Commonly known as:  ZOCOR Take 1 tablet (20 mg total) by mouth daily.   solifenacin 10 MG tablet Commonly known as:  VESICARE Take 1 tablet (10 mg total) by mouth daily.   venlafaxine XR 75 MG 24 hr capsule Commonly known as:  EFFEXOR XR Take 3 capsules (225 mg total) by mouth daily.         DISCHARGE INSTRUCTIONS:   DIET:  Cardiac diet  DISCHARGE CONDITION:  Stable  ACTIVITY:  Activity as tolerated  OXYGEN:  Home Oxygen: No.   Oxygen Delivery: room air  DISCHARGE LOCATION:  home   If you experience worsening of your admission symptoms, develop shortness of breath, life threatening emergency, suicidal or homicidal thoughts you must seek medical attention immediately by calling 911 or calling your MD immediately  if symptoms less severe.  You Must read complete instructions/literature along with all the possible adverse reactions/side effects for all the  Medicines you take and that have been prescribed to you. Take any new Medicines after you have completely understood and accpet all the possible adverse reactions/side effects.   Please note  You were cared for by a hospitalist during your hospital stay. If you have any questions about your discharge medications or the care you received while you were in the hospital after you are discharged, you can call the unit and asked to speak with the hospitalist on call if the hospitalist that took care of you is not available. Once you are discharged, your primary care physician will handle any further medical issues. Please note that NO REFILLS for any discharge medications will be authorized once you are discharged, as it is imperative that you return to your primary care physician (or establish a  relationship with a primary care physician if you do not have one) for your aftercare needs so that they can reassess your need for medications and monitor your lab values.     Today   No acute events overnight.  No further syncope.  No alarms on telemetry.  Patient is not orthostatic.  He ambulated without any acute symptoms of dizziness.  He is being discharged home.  VITAL SIGNS:  Blood pressure 116/65, pulse 61, temperature 98.4 F (36.9 C), resp. rate 18, height 5\' 11"  (1.803 m), weight 131.3 kg (289 lb 6.4 oz), SpO2 96 %.  I/O:    Intake/Output Summary (Last 24 hours) at 06/13/2017 1106 Last data filed at 06/12/2017 1158 Gross per 24 hour  Intake -  Output 420 ml  Net -420 ml    PHYSICAL EXAMINATION:  GENERAL:  72 y.o.-year-old patient lying in the bed with no acute distress.  EYES: Pupils equal, round, reactive to light and accommodation. No scleral icterus. Extraocular muscles intact.  HEENT: Head atraumatic, normocephalic. Oropharynx and nasopharynx clear.  NECK:  Supple, no jugular venous distention. No thyroid enlargement, no tenderness.  LUNGS: Normal breath sounds bilaterally, no  wheezing, rales,rhonchi. No use of accessory muscles of respiration.  CARDIOVASCULAR: S1, S2 normal. No murmurs, rubs, or gallops.  ABDOMEN: Soft, non-tender, non-distended. Bowel sounds present. No organomegaly or mass.  EXTREMITIES: No pedal edema, cyanosis, or clubbing.  NEUROLOGIC: Cranial nerves II through XII are intact. No focal motor or sensory defecits b/l.  PSYCHIATRIC: The patient is alert and oriented x 3. .  SKIN: No obvious rash, lesion, or ulcer.   DATA REVIEW:   CBC Recent Labs  Lab 06/12/17 0417  WBC 8.4  HGB 13.6  HCT 39.7*  PLT 142*    Chemistries  Recent Labs  Lab 06/11/17 1609 06/12/17 0417  NA 135 139  K 4.7 4.1  CL 98* 107  CO2 27 26  GLUCOSE 121* 125*  BUN 44* 31*  CREATININE 1.88* 0.95  CALCIUM 9.3 8.3*  AST 33  --   ALT 21  --   ALKPHOS 45  --   BILITOT 1.2  --     Cardiac Enzymes Recent Labs  Lab 06/11/17 1609  TROPONINI <0.03    Microbiology Results  Results for orders placed or performed during the hospital encounter of 06/11/17  Urine Culture     Status: Abnormal (Preliminary result)   Collection Time: 06/11/17  7:38 PM  Result Value Ref Range Status   Specimen Description   Final    URINE, RANDOM Performed at [facility], [address] [place], [place] [address]    Special Requests   Final    NONE Performed at [facility], [address], [place], [place] [address]    Culture >=100,000 COLONIES/mL ENTEROCOCCUS FAECALIS (A)  Final   Report Status PENDING  Incomplete    RADIOLOGY:  Ct Head Wo Contrast  Result Date: 06/11/2017 CLINICAL DATA:  Fall, dizziness, posterior scalp laceration, on Eliquis EXAM: CT HEAD WITHOUT CONTRAST CT CERVICAL SPINE WITHOUT CONTRAST TECHNIQUE: Multidetector CT imaging of the head and cervical spine was performed following the standard protocol without intravenous contrast. Multiplanar CT image reconstructions of the cervical spine were also generated. COMPARISON:  None.  FINDINGS: CT HEAD FINDINGS Brain: No evidence of acute infarction, hemorrhage, hydrocephalus, extra-axial collection or mass lesion/mass effect. Subcortical white matter and periventricular small vessel ischemic changes. Vascular: Intracranial atherosclerosis. Skull: Normal. Negative for fracture or focal lesion. Sinuses/Orbits: The visualized  paranasal sinuses are essentially clear. The mastoid air cells are unopacified. Other: Soft tissue laceration overlying the right parietal scalp (series 4/image 67). CT CERVICAL SPINE FINDINGS Alignment: Mild straightening of the cervical spine, likely positional. Skull base and vertebrae: No acute fracture. No primary bone lesion or focal pathologic process. Soft tissues and spinal canal: No prevertebral fluid or swelling. No visible canal hematoma. Disc levels: Mild degenerative changes of the mid cervical spine. Spinal canal is patent. Upper chest: Visualized lung apices are clear. Other: Visualized thyroid is unremarkable. IMPRESSION: Soft tissue laceration overlying the right parietal scalp. No evidence of calvarial fracture. No evidence of acute intracranial abnormality. Small vessel ischemic changes. No evidence of traumatic injury to the cervical spine. Mild degenerative changes. Electronically Signed   By: Julian Hy M.D.   On: 06/11/2017 15:46   Ct Cervical Spine Wo Contrast  Result Date: 06/11/2017 CLINICAL DATA:  Fall, dizziness, posterior scalp laceration, on Eliquis EXAM: CT HEAD WITHOUT CONTRAST CT CERVICAL SPINE WITHOUT CONTRAST TECHNIQUE: Multidetector CT imaging of the head and cervical spine was performed following the standard protocol without intravenous contrast. Multiplanar CT image reconstructions of the cervical spine were also generated. COMPARISON:  None. FINDINGS: CT HEAD FINDINGS Brain: No evidence of acute infarction, hemorrhage, hydrocephalus, extra-axial collection or mass lesion/mass effect. Subcortical white matter and  periventricular small vessel ischemic changes. Vascular: Intracranial atherosclerosis. Skull: Normal. Negative for fracture or focal lesion. Sinuses/Orbits: The visualized paranasal sinuses are essentially clear. The mastoid air cells are unopacified. Other: Soft tissue laceration overlying the right parietal scalp (series 4/image 67). CT CERVICAL SPINE FINDINGS Alignment: Mild straightening of the cervical spine, likely positional. Skull base and vertebrae: No acute fracture. No primary bone lesion or focal pathologic process. Soft tissues and spinal canal: No prevertebral fluid or swelling. No visible canal hematoma. Disc levels: Mild degenerative changes of the mid cervical spine. Spinal canal is patent. Upper chest: Visualized lung apices are clear. Other: Visualized thyroid is unremarkable. IMPRESSION: Soft tissue laceration overlying the right parietal scalp. No evidence of calvarial fracture. No evidence of acute intracranial abnormality. Small vessel ischemic changes. No evidence of traumatic injury to the cervical spine. Mild degenerative changes. Electronically Signed   By: Julian Hy M.D.   On: 06/11/2017 15:46      Management plans discussed with the patient, family and they are in agreement.  CODE STATUS:  Code Status History    Date Active Date Inactive Code Status Order ID Comments User Context   06/11/2017 2106 06/12/2017 1607 Full Code [id number]  Vaughan Basta, MD Inpatient   TOTAL TIME TAKING CARE OF THIS PATIENT: 40 minutes.    Henreitta Leber M.D on 06/13/2017 at 11:06 AM  Between 7am to 6pm - Pager - [phone]  After 6pm go to [email or web address] - Proofreader  [facility]  Office  [phone]  CC: Primary care physician; [name], MD

## 2017-06-13 NOTE — Telephone Encounter (Signed)
I have made the 1st attempt to contact the patient or family member in charge, in order to follow up from recently being discharged from the hospital. I left a message on voicemail but I will make another attempt a-t a different time.   Direct number for call [phone]

## 2017-06-14 ENCOUNTER — Encounter: Payer: Self-pay | Admitting: Cardiology

## 2017-06-14 LAB — CUP PACEART REMOTE DEVICE CHECK
Implantable Lead Implant Date: 20180124
Implantable Lead Location: 753859
Implantable Lead Location: 753860
MDC IDC LEAD IMPLANT DT: 20180124
MDC IDC PG IMPLANT DT: 20180124
MDC IDC SESS DTM: 20190523095712

## 2017-06-14 NOTE — Progress Notes (Signed)
Remote pacemaker transmission.   

## 2017-06-14 NOTE — Progress Notes (Signed)
Letter  

## 2017-06-15 LAB — URINE CULTURE

## 2017-06-16 ENCOUNTER — Other Ambulatory Visit: Payer: Self-pay | Admitting: Family Medicine

## 2017-06-16 DIAGNOSIS — E785 Hyperlipidemia, unspecified: Secondary | ICD-10-CM

## 2017-06-19 NOTE — Telephone Encounter (Signed)
Simvastatin refill Last office visit 08/15/16 Last refill 05/16/16; # 90; RF x 4 PCP Dr. Jeananne Rama [facility] : [facility] [place], [place]  Refilled protocol, as pt's physical in April was resched. to 07/02/17 per provider

## 2017-06-21 ENCOUNTER — Ambulatory Visit (INDEPENDENT_AMBULATORY_CARE_PROVIDER_SITE_OTHER): Payer: Medicare Other | Admitting: Family Medicine

## 2017-06-21 ENCOUNTER — Encounter: Payer: Self-pay | Admitting: Family Medicine

## 2017-06-21 VITALS — BP 144/93 | HR 73 | Ht 71.0 in | Wt 289.0 lb

## 2017-06-21 DIAGNOSIS — N179 Acute kidney failure, unspecified: Secondary | ICD-10-CM | POA: Diagnosis not present

## 2017-06-21 DIAGNOSIS — R55 Syncope and collapse: Secondary | ICD-10-CM

## 2017-06-21 DIAGNOSIS — Z4802 Encounter for removal of sutures: Secondary | ICD-10-CM

## 2017-06-21 DIAGNOSIS — I10 Essential (primary) hypertension: Secondary | ICD-10-CM

## 2017-06-21 NOTE — Progress Notes (Signed)
BP (!) 144/93   Pulse 73   Ht 5\' 11"  (1.803 m)   Wt 289 lb (131.1 kg)   SpO2 97%   BMI 40.31 kg/m    Subjective:    Patient ID: Nicholas Nicholas [name], male    DOB: 1945/05/19, 72 y.o.   MRN: [id number]  HPI: Nicholas Nicholas [name] is a 72 y.o. male  Chief Complaint  Patient presents with  . Fall  . Suture / Staple Removal    Pt states he blacked out from dehyrdation last monday. Presented to E.R. recieved 3 staples. Need removed  . Hypertension    Blood pressure has been high   Has been feeling better since coming out of the hospital. No headaches. Has been increasing his fluids. Needs his sutures removed.  HYPERTENSION Hypertension status: exacerbated  Satisfied with current treatment? yes Duration of hypertension: chronic BP monitoring frequency:  not checking BP medication side effects:  no Medication compliance: good compliance Aspirin: no Recurrent headaches: no Visual changes: no Palpitations: no Dyspnea: no Chest pain: no Lower extremity edema: no Dizzy/lightheaded: no   Transition of Care Hospital Follow up.   Hospital/Facility: [facility] D/C Physician: Dr. Verdell Carmine D/C Date: 06/12/17  Records Requested: 06/13/17 Records Received: 06/13/17 Records Reviewed: 06/13/17  Diagnoses on Discharge:  Syncope  Date of interactive Contact within 48 hours of discharge: 06/13/17 Contact was through: phone  Date of 7 day or 14 day face-to-face visit: 06/21/17   within 14 days  Outpatient Encounter Medications as of 06/21/2017  Medication Sig  . acetaminophen (TYLENOL) 500 MG tablet Take 2,000 mg by mouth daily as needed for moderate pain or headache.  Marland Kitchen ascorbic acid (VITAMIN C) 250 MG tablet Take 250 mg by mouth daily.   Marland Kitchen b complex vitamins capsule Take 1 capsule by mouth daily.  . Cranberry 500 MG CAPS Take 1 capsule by mouth daily.   . Cyanocobalamin (B-12) 1000 MCG CAPS Take 1,000 mcg by mouth daily.   Marland Kitchen ELIQUIS 5 MG TABS tablet TAKE 1 TABLET BY MOUTH TWICE A DAY  . ferrous  sulfate 325 (65 FE) MG tablet Take 325 mg by mouth daily with breakfast.  . flecainide (TAMBOCOR) 100 MG tablet Take 1 tablet (100 mg total) by mouth 2 (two) times daily.  Marland Kitchen gabapentin (NEURONTIN) 400 MG capsule Take 1 capsule (400 mg total) by mouth 3 (three) times daily.  . hyoscyamine (LEVSIN, ANASPAZ) 0.125 MG tablet Take 0.125 mg by mouth every 6 (six) hours as needed for bladder spasms or cramping.   Marland Kitchen LORazepam (ATIVAN) 1 MG tablet TAKE 1 TABLET BY MOUTH AT BEDTIME  . Magnesium 400 MG TABS Take 400 mg by mouth daily.   . Melatonin 5 MG TABS Take 10 mg by mouth at bedtime.  . Multiple Vitamin (MULTI-VITAMINS) TABS Take 1 tablet by mouth daily.   . Omega-3 Fatty Acids (FISH OIL) 1200 MG CAPS Take 1,200 mg by mouth daily.  Marland Kitchen oxymetazoline (AFRIN) 0.05 % nasal spray Place 2 sprays into both nostrils at bedtime as needed for congestion.  . pantoprazole (PROTONIX) 40 MG tablet TAKE 1 TABLET (40 MG TOTAL) BY MOUTH ONCE DAILY TAKE 30 MINS BEFORE MEAL  . QUEtiapine Fumarate (SEROQUEL XR) 150 MG 24 hr tablet Take 1 tablet (150 mg total) by mouth at bedtime.  . simvastatin (ZOCOR) 20 MG tablet TAKE 1 TABLET (20 MG TOTAL) BY MOUTH DAILY.  Marland Kitchen solifenacin (VESICARE) 10 MG tablet Take 1 tablet (10 mg total) by mouth daily.  Marland Kitchen  venlafaxine XR (EFFEXOR XR) 75 MG 24 hr capsule Take 3 capsules (225 mg total) by mouth daily.  . [DISCONTINUED] HYDROcodone-acetaminophen (NORCO/VICODIN) 5-325 MG tablet Take 1 tablet by mouth 2 (two) times daily at 10 AM and 5 PM.   No facility-administered encounter medications on file as of 06/21/2017.    HOSPITAL Nicholas [name]:   72 year old male with past medical history of hypertension, hyperlipidemia, history of pulmonary embolism, obesity, history of complete heart block status post pacemaker, GERD, anxiety, osteoarthritis who presented to the hospital due to a syncopal episode.  1.  Syncope-this was likely secondary to dehydration and hypotension.  Patient was observed  overnight in the hospital, had 3 sets of cardiac markers checked which were negative.  He had no arrhythmias on telemetry. -He was hypotensive but not orthostatic.  His pacemaker was also interrogated which showed no acute abnormalities.  He has had no further episodes of syncope while in the hospital and therefore not being discharged home. -Patient apparently played some golf in the hot weather with not adequate hydration which is probably what led to the patient's syncope.  2.  Acute kidney injury-secondary to the hypotension and dehydration.  Patient was given IV fluids, his creatinine has improved is not baseline.  3.  History of atrial fibrillation- patient is rate controlled.  He will continue his flecainide. - he will also resume his Eliquis  4.  UTI-this is based off a urinalysis on admission.  Patient had no acute symptoms although.  He was empirically given some IV ceftriaxone and being discharged on oral Ceftin for additional 3 days.  5.  Hyperlipidemia-patient will continue his simvastatin.  6.  GERD-patient will continue his Protonix.  7.  Neuropathy-patient will continue his gabapentin.  Diagnostic Tests Reviewed: Labs  Disposition: Home  Consults: None  Discharge Instructions: Follow up here for Staple Removal  Disease/illness Education: Increase fluids  Home Health/Community Services Discussions/Referrals: N/A  Establishment or re-establishment of referral orders for community resources: N/A  Discussion with other health care providers: N/A  Assessment and Support of treatment regimen adherence: Good  Appointments Coordinated with: Patient  Education for self-management, independent living, and ADLs: N/A   Relevant past medical, surgical, family and social history reviewed and updated as indicated. Interim medical history since our last visit reviewed. Allergies and medications reviewed and updated.  Review of Systems  Constitutional: Negative.     Respiratory: Negative.   Cardiovascular: Negative.   Musculoskeletal: Negative.   Neurological: Negative.   Psychiatric/Behavioral: Negative.     Per HPI unless specifically indicated above     Objective:    BP (!) 144/93   Pulse 73   Ht 5\' 11"  (1.803 m)   Wt 289 lb (131.1 kg)   SpO2 97%   BMI 40.31 kg/m   Wt Readings from Last 3 Encounters:  06/22/17 294 lb (133.4 kg)  06/21/17 289 lb (131.1 kg)  06/11/17 289 lb 6.4 oz (131.3 kg)    Physical Exam  Constitutional: He is oriented to person, place, and time. He appears well-developed and well-nourished. No distress.  HENT:  Head: Normocephalic.  Right Ear: Hearing and external ear normal.  Left Ear: Hearing and external ear normal.  Nose: Nose normal.  Mouth/Throat: Oropharynx is clear and moist. No oropharyngeal exudate.  3 sutures on the R side of his head  Eyes: Pupils are equal, round, and reactive to light. Conjunctivae, EOM and lids are normal. Right eye exhibits no discharge. Left eye exhibits no discharge. No scleral icterus.  Neck: Normal range of motion. Neck supple. No JVD present. No tracheal deviation present. No thyromegaly present.  Cardiovascular: Normal rate, regular rhythm, normal heart sounds and intact distal pulses. Exam reveals no gallop and no friction rub.  No murmur heard. Pulmonary/Chest: Effort normal and breath sounds normal. No stridor. No respiratory distress. He has no wheezes. He has no rales. He exhibits no tenderness.  Musculoskeletal: Normal range of motion.  Lymphadenopathy:    He has no cervical adenopathy.  Neurological: He is alert and oriented to person, place, and time.  Skin: Skin is warm, dry and intact. Capillary refill takes less than 2 seconds. No rash noted. He is not diaphoretic. No erythema. No pallor.  Psychiatric: He has a normal mood and affect. His speech is normal and behavior is normal. Judgment and thought content normal. Cognition and memory are normal.  Nursing note  and vitals reviewed.   Results for orders placed or performed in visit on 07/86/75  Basic metabolic panel  Result Value Ref Range   Glucose 168 (H) 65 - 99 mg/dL   BUN 15 8 - 27 mg/dL   Creatinine, Ser 1.00 0.76 - 1.27 mg/dL   GFR calc non Af Amer 75 >59 mL/min/1.73   GFR calc Af Amer 87 >59 mL/min/1.73   BUN/Creatinine Ratio 15 10 - 24   Sodium 142 134 - 144 mmol/L   Potassium 4.4 3.5 - 5.2 mmol/L   Chloride 102 96 - 106 mmol/L   CO2 25 20 - 29 mmol/L   Calcium 9.4 8.6 - 10.2 mg/dL      Assessment & Plan:   Problem List Items Addressed This Visit      Cardiovascular and Mediastinum   HTN (hypertension) (Chronic)    Better on recheck. Continue current regimen. Continue to monitor. Call with any concerns.        Other Visit Diagnoses    AKI (acute kidney injury) ([place])    -  Primary   Will recheck kidney function. Await results. Continue fluids.    Relevant Orders   Basic metabolic panel (Completed)   Near syncope       No dizziness. Continue to push fluids and stay out of the heat. Continue to monitor. Call with any concerns.    Encounter for staple removal       3 staples moved without issues.        Follow up plan: Return As scheduled.

## 2017-06-22 ENCOUNTER — Encounter: Payer: Self-pay | Admitting: Anesthesiology

## 2017-06-22 ENCOUNTER — Other Ambulatory Visit: Payer: Self-pay

## 2017-06-22 ENCOUNTER — Ambulatory Visit: Payer: Medicare Other | Attending: Anesthesiology | Admitting: Anesthesiology

## 2017-06-22 VITALS — BP 157/92 | HR 84 | Temp 97.9°F | Ht 71.0 in | Wt 294.0 lb

## 2017-06-22 DIAGNOSIS — Z79899 Other long term (current) drug therapy: Secondary | ICD-10-CM | POA: Diagnosis not present

## 2017-06-22 DIAGNOSIS — F1190 Opioid use, unspecified, uncomplicated: Secondary | ICD-10-CM | POA: Diagnosis not present

## 2017-06-22 DIAGNOSIS — M79641 Pain in right hand: Secondary | ICD-10-CM | POA: Diagnosis not present

## 2017-06-22 DIAGNOSIS — M1611 Unilateral primary osteoarthritis, right hip: Secondary | ICD-10-CM | POA: Insufficient documentation

## 2017-06-22 DIAGNOSIS — Z79891 Long term (current) use of opiate analgesic: Secondary | ICD-10-CM | POA: Diagnosis not present

## 2017-06-22 DIAGNOSIS — M79643 Pain in unspecified hand: Secondary | ICD-10-CM

## 2017-06-22 DIAGNOSIS — M170 Bilateral primary osteoarthritis of knee: Secondary | ICD-10-CM | POA: Diagnosis not present

## 2017-06-22 DIAGNOSIS — M5431 Sciatica, right side: Secondary | ICD-10-CM | POA: Diagnosis not present

## 2017-06-22 DIAGNOSIS — Z950 Presence of cardiac pacemaker: Secondary | ICD-10-CM | POA: Insufficient documentation

## 2017-06-22 DIAGNOSIS — I672 Cerebral atherosclerosis: Secondary | ICD-10-CM | POA: Insufficient documentation

## 2017-06-22 DIAGNOSIS — M5432 Sciatica, left side: Secondary | ICD-10-CM

## 2017-06-22 DIAGNOSIS — M5136 Other intervertebral disc degeneration, lumbar region: Secondary | ICD-10-CM | POA: Diagnosis not present

## 2017-06-22 DIAGNOSIS — I951 Orthostatic hypotension: Secondary | ICD-10-CM

## 2017-06-22 DIAGNOSIS — R1031 Right lower quadrant pain: Secondary | ICD-10-CM | POA: Diagnosis present

## 2017-06-22 DIAGNOSIS — M47817 Spondylosis without myelopathy or radiculopathy, lumbosacral region: Secondary | ICD-10-CM | POA: Diagnosis not present

## 2017-06-22 DIAGNOSIS — G8929 Other chronic pain: Secondary | ICD-10-CM

## 2017-06-22 DIAGNOSIS — G894 Chronic pain syndrome: Secondary | ICD-10-CM | POA: Insufficient documentation

## 2017-06-22 DIAGNOSIS — Z7901 Long term (current) use of anticoagulants: Secondary | ICD-10-CM | POA: Diagnosis not present

## 2017-06-22 LAB — BASIC METABOLIC PANEL
BUN/Creatinine Ratio: 15 (ref 10–24)
BUN: 15 mg/dL (ref 8–27)
CALCIUM: 9.4 mg/dL (ref 8.6–10.2)
CHLORIDE: 102 mmol/L (ref 96–106)
CO2: 25 mmol/L (ref 20–29)
Creatinine, Ser: 1 mg/dL (ref 0.76–1.27)
GFR calc non Af Amer: 75 mL/min/{1.73_m2} (ref >59)
GFR, EST AFRICAN AMERICAN: 87 mL/min/{1.73_m2} (ref >59)
GLUCOSE: 168 mg/dL — AB (ref 65–99)
POTASSIUM: 4.4 mmol/L (ref 3.5–5.2)
Sodium: 142 mmol/L (ref 134–144)

## 2017-06-22 MED ORDER — HYDROCODONE-ACETAMINOPHEN 5-325 MG PO TABS: 1.0000 | ORAL_TABLET | Freq: Two times a day (BID) | ORAL | 0 refills | Status: DC

## 2017-06-22 NOTE — Progress Notes (Signed)
Nursing Pain Medication Assessment:  Safety precautions to be maintained throughout the outpatient stay will include: orient to surroundings, keep bed in low position, maintain call bell within reach at all times, provide assistance with transfer out of bed and ambulation.  Medication Inspection Compliance: Pill count conducted under aseptic conditions, in front of the patient. Neither the pills nor the bottle was removed from the patient's sight at any time. Once count was completed pills were immediately returned to the patient in their original bottle.  Medication: Hydrocodone/APAP Pill/Patch Count: 22 of 60 pills remain Pill/Patch Appearance: Markings consistent with prescribed medication Bottle Appearance: Standard pharmacy container. Clearly labeled. Filled Date05 / 11 / 2019 Last Medication intake:  Today

## 2017-06-22 NOTE — Patient Instructions (Signed)
You have been given 2 scripts for Hydrocodone today.  You provided a UDS for [place] today.

## 2017-06-22 NOTE — Progress Notes (Signed)
Subjective:  Patient ID: Nicholas [name], male    DOB: 1945-09-21  Age: 72 y.o. MRN: [id number]  CC: Groin Pain (right) and Hand Pain   Procedure: None  HPI AYREN ZUMBRO presents for reevaluation.  He was last seen 2 months ago.  He did play golf a few weeks ago came home and had an orthostatic event in which he hit his head.  He was seen in the ER and received stitches.  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  He has been doing well since then with no other untoward side effects noted.  He is been tolerating his medications well to based on his narcotic assessment she has been doing well and derive good functional lifestyle improvement with the medicines.  Otherwise no other changes noted in his lower extremity strength or function or bowel bladder function.  Outpatient Medications Prior to Visit  Medication Sig Dispense Refill  . acetaminophen (TYLENOL) 500 MG tablet Take 2,000 mg by mouth daily as needed for moderate pain or headache.    Marland Kitchen ascorbic acid (VITAMIN C) 250 MG tablet Take 250 mg by mouth daily.     Marland Kitchen b complex vitamins capsule Take 1 capsule by mouth daily.    . Cranberry 500 MG CAPS Take 1 capsule by mouth daily.     . Cyanocobalamin (B-12) 1000 MCG CAPS Take 1,000 mcg by mouth daily.     . cyclobenzaprine (FLEXERIL) 10 MG tablet cyclobenzaprine 10 mg tablet  TAKE 1 TABLET BY MOUTH THREE TIMES A DAY    . ELIQUIS 5 MG TABS tablet TAKE 1 TABLET BY MOUTH TWICE A DAY 180 tablet 2  . ferrous sulfate 325 (65 FE) MG tablet Take 325 mg by mouth daily with breakfast.    . flecainide (TAMBOCOR) 100 MG tablet Take 1 tablet (100 mg total) by mouth 2 (two) times daily. 180 tablet 3  . gabapentin (NEURONTIN) 400 MG capsule Take 1 capsule (400 mg total) by mouth 3 (three) times daily. 270 capsule 4  . hyoscyamine (LEVSIN, ANASPAZ) 0.125 MG tablet Take 0.125 mg by mouth every 6 (six) hours as needed for bladder spasms or cramping.     Marland Kitchen LORazepam (ATIVAN) 1 MG tablet TAKE 1 TABLET BY  MOUTH AT BEDTIME 90 tablet 1  . Magnesium 400 MG TABS Take 400 mg by mouth daily.     . Melatonin 5 MG TABS Take 10 mg by mouth at bedtime.    . Multiple Vitamin (MULTI-VITAMINS) TABS Take 1 tablet by mouth daily.     . Omega-3 Fatty Acids (FISH OIL) 1200 MG CAPS Take 1,200 mg by mouth daily.    Marland Kitchen oxymetazoline (AFRIN) 0.05 % nasal spray Place 2 sprays into both nostrils at bedtime as needed for congestion.    . pantoprazole (PROTONIX) 40 MG tablet TAKE 1 TABLET (40 MG TOTAL) BY MOUTH ONCE DAILY TAKE 30 MINS BEFORE MEAL  6  . QUEtiapine Fumarate (SEROQUEL XR) 150 MG 24 hr tablet Take 1 tablet (150 mg total) by mouth at bedtime. 90 tablet 4  . simvastatin (ZOCOR) 20 MG tablet TAKE 1 TABLET (20 MG TOTAL) BY MOUTH DAILY. 90 tablet 0  . solifenacin (VESICARE) 10 MG tablet Take 1 tablet (10 mg total) by mouth daily. 90 tablet 4  . venlafaxine XR (EFFEXOR XR) 75 MG 24 hr capsule Take 3 capsules (225 mg total) by mouth daily. 270 capsule 4  . HYDROcodone-acetaminophen (NORCO/VICODIN) 5-325 MG tablet Take 1 tablet by mouth 2 (  two) times daily at 10 AM and 5 PM. 60 tablet 0   No facility-administered medications prior to visit.     Review of Systems CNS: No confusion or sedation Cardiac: No angina or palpitations GI: No abdominal pain or constipation Constitutional: No nausea vomiting fevers or chills  Objective:  BP (!) 157/92   Pulse 84   Temp 97.9 F (36.6 C)   Ht 5\' 11"  (1.803 m)   Wt 294 lb (133.4 kg)   SpO2 97%   BMI 41.00 kg/m    BP Readings from Last 3 Encounters:  06/22/17 (!) 157/92  06/21/17 (!) 144/93  06/12/17 116/65     Wt Readings from Last 3 Encounters:  06/22/17 294 lb (133.4 kg)  06/21/17 289 lb (131.1 kg)  06/11/17 289 lb 6.4 oz (131.3 kg)     Physical Exam Pt is alert and oriented PERRL EOMI HEART IS RRR no murmur or rub LCTA no wheezing or rales MUSCULOSKELETAL reveals some paraspinous muscle tenderness in the lumbar region but no overt trigger points  in his lower extremity strength and function are at baseline.  Labs  No results found for: HGBA1C Lab Results  Component Value Date   LDLCALC 94 03/23/2016   CREATININE 1.00 06/21/2017    -------------------------------------------------------------------------------------------------------------------- Lab Results  Component Value Date   WBC 8.4 06/12/2017   HGB 13.6 06/12/2017   HCT 39.7 (L) 06/12/2017   PLT 142 (L) 06/12/2017   GLUCOSE 168 (H) 06/21/2017   CHOL 181 08/15/2016   TRIG 107 08/15/2016   HDL 44 03/23/2016   LDLCALC 94 03/23/2016   ALT 21 06/11/2017   AST 33 06/11/2017   NA 142 06/21/2017   K 4.4 06/21/2017   CL 102 06/21/2017   CREATININE 1.00 06/21/2017   BUN 15 06/21/2017   CO2 25 06/21/2017   TSH 2.760 03/23/2016   INR 1.2 02/16/2014    --------------------------------------------------------------------------------------------------------------------- Ct Head Wo Contrast  Result Date: 06/11/2017 CLINICAL DATA:  Fall, dizziness, posterior scalp laceration, on Eliquis EXAM: CT HEAD WITHOUT CONTRAST CT CERVICAL SPINE WITHOUT CONTRAST TECHNIQUE: Multidetector CT imaging of the head and cervical spine was performed following the standard protocol without intravenous contrast. Multiplanar CT image reconstructions of the cervical spine were also generated. COMPARISON:  None. FINDINGS: CT HEAD FINDINGS Brain: No evidence of acute infarction, hemorrhage, hydrocephalus, extra-axial collection or mass lesion/mass effect. Subcortical white matter and periventricular small vessel ischemic changes. Vascular: Intracranial atherosclerosis. Skull: Normal. Negative for fracture or focal lesion. Sinuses/Orbits: The visualized paranasal sinuses are essentially clear. The mastoid air cells are unopacified. Other: Soft tissue laceration overlying the right parietal scalp (series 4/image 67). CT CERVICAL SPINE FINDINGS Alignment: Mild straightening of the cervical spine, likely  positional. Skull base and vertebrae: No acute fracture. No primary bone lesion or focal pathologic process. Soft tissues and spinal canal: No prevertebral fluid or swelling. No visible canal hematoma. Disc levels: Mild degenerative changes of the mid cervical spine. Spinal canal is patent. Upper chest: Visualized lung apices are clear. Other: Visualized thyroid is unremarkable. IMPRESSION: Soft tissue laceration overlying the right parietal scalp. No evidence of calvarial fracture. No evidence of acute intracranial abnormality. Small vessel ischemic changes. No evidence of traumatic injury to the cervical spine. Mild degenerative changes. Electronically Signed   By: Julian Hy M.D.   On: 06/11/2017 15:46   Ct Cervical Spine Wo Contrast  Result Date: 06/11/2017 CLINICAL DATA:  Fall, dizziness, posterior scalp laceration, on Eliquis EXAM: CT HEAD WITHOUT CONTRAST CT CERVICAL SPINE  WITHOUT CONTRAST TECHNIQUE: Multidetector CT imaging of the head and cervical spine was performed following the standard protocol without intravenous contrast. Multiplanar CT image reconstructions of the cervical spine were also generated. COMPARISON:  None. FINDINGS: CT HEAD FINDINGS Brain: No evidence of acute infarction, hemorrhage, hydrocephalus, extra-axial collection or mass lesion/mass effect. Subcortical white matter and periventricular small vessel ischemic changes. Vascular: Intracranial atherosclerosis. Skull: Normal. Negative for fracture or focal lesion. Sinuses/Orbits: The visualized paranasal sinuses are essentially clear. The mastoid air cells are unopacified. Other: Soft tissue laceration overlying the right parietal scalp (series 4/image 67). CT CERVICAL SPINE FINDINGS Alignment: Mild straightening of the cervical spine, likely positional. Skull base and vertebrae: No acute fracture. No primary bone lesion or focal pathologic process. Soft tissues and spinal canal: No prevertebral fluid or swelling. No visible  canal hematoma. Disc levels: Mild degenerative changes of the mid cervical spine. Spinal canal is patent. Upper chest: Visualized lung apices are clear. Other: Visualized thyroid is unremarkable. IMPRESSION: Soft tissue laceration overlying the right parietal scalp. No evidence of calvarial fracture. No evidence of acute intracranial abnormality. Small vessel ischemic changes. No evidence of traumatic injury to the cervical spine. Mild degenerative changes. Electronically Signed   By: Julian Hy M.D.   On: 06/11/2017 15:46     Assessment & Plan:   Terrelle was seen today for groin pain and hand pain.  Diagnoses and all orders for this visit:  Bilateral sciatica  DDD (degenerative disc disease), lumbar  Osteoarthritis of right hip, unspecified osteoarthritis type  Facet arthritis of lumbosacral region  Chronic pain syndrome  Chronic, continuous use of opioids  Chronic hand pain, unspecified laterality  Primary osteoarthritis of both knees  Orthostasis  Other orders -     Discontinue: HYDROcodone-acetaminophen (NORCO/VICODIN) 5-325 MG tablet; Take 1 tablet by mouth 2 (two) times daily at 10 AM and 5 PM. -     HYDROcodone-acetaminophen (NORCO/VICODIN) 5-325 MG tablet; Take 1 tablet by mouth 2 (two) times daily at 10 AM and 5 PM.        ----------------------------------------------------------------------------------------------------------------------  Problem List Items Addressed This Visit    None    Visit Diagnoses    Bilateral sciatica    -  Primary   Relevant Medications   cyclobenzaprine (FLEXERIL) 10 MG tablet   DDD (degenerative disc disease), lumbar       Relevant Medications   cyclobenzaprine (FLEXERIL) 10 MG tablet   HYDROcodone-acetaminophen (NORCO/VICODIN) 5-325 MG tablet   Osteoarthritis of right hip, unspecified osteoarthritis type       Relevant Medications   cyclobenzaprine (FLEXERIL) 10 MG tablet   HYDROcodone-acetaminophen (NORCO/VICODIN) 5-325  MG tablet   Facet arthritis of lumbosacral region       Relevant Medications   cyclobenzaprine (FLEXERIL) 10 MG tablet   HYDROcodone-acetaminophen (NORCO/VICODIN) 5-325 MG tablet   Chronic pain syndrome       Chronic, continuous use of opioids       Chronic hand pain, unspecified laterality       Relevant Medications   cyclobenzaprine (FLEXERIL) 10 MG tablet   HYDROcodone-acetaminophen (NORCO/VICODIN) 5-325 MG tablet   Primary osteoarthritis of both knees       Relevant Medications   cyclobenzaprine (FLEXERIL) 10 MG tablet   HYDROcodone-acetaminophen (NORCO/VICODIN) 5-325 MG tablet   Orthostasis            ----------------------------------------------------------------------------------------------------------------------  1. Bilateral sciatica Continue with core stretching strengthening.  I have cautioned him about playing golf in the heat.  We have had a  general discussion about this and caution with opioid administration and heat.  2. DDD (degenerative disc disease), lumbar As above  3. Osteoarthritis of right hip, unspecified osteoarthritis type As above  4. Facet arthritis of lumbosacral region   5. Chronic pain syndrome We have reviewed the [facility] practitioner database information and it is appropriate.  Refills will be given for June and July of this year.  He is to return to clinic in approximately 2 months.  6. Chronic, continuous use of opioids   7. Chronic hand pain, unspecified laterality   8. Primary osteoarthritis of both knees   9. Orthostasis Continue follow-up with his primary care physicians and cardiologist    ----------------------------------------------------------------------------------------------------------------------  I am having Lion T. Rizzolo "Tommy" maintain his oxymetazoline, B-12, ferrous sulfate, hyoscyamine, Fish Oil, acetaminophen, ascorbic acid, Cranberry, MULTI-VITAMINS, gabapentin, QUEtiapine Fumarate, solifenacin,  venlafaxine XR, Magnesium, flecainide, LORazepam, pantoprazole, b complex vitamins, ELIQUIS, Melatonin, simvastatin, cyclobenzaprine, and HYDROcodone-acetaminophen.   Meds ordered this encounter  Medications  . DISCONTD: HYDROcodone-acetaminophen (NORCO/VICODIN) 5-325 MG tablet    Sig: Take 1 tablet by mouth 2 (two) times daily at 10 AM and 5 PM.    Dispense:  60 tablet    Refill:  0    Do not fill until [id number]  . HYDROcodone-acetaminophen (NORCO/VICODIN) 5-325 MG tablet    Sig: Take 1 tablet by mouth 2 (two) times daily at 10 AM and 5 PM.    Dispense:  60 tablet    Refill:  0    Do not fill until [id number]   Patient's Medications  New Prescriptions   No medications on file  Previous Medications   ACETAMINOPHEN (TYLENOL) 500 MG TABLET    Take 2,000 mg by mouth daily as needed for moderate pain or headache.   ASCORBIC ACID (VITAMIN C) 250 MG TABLET    Take 250 mg by mouth daily.    B COMPLEX VITAMINS CAPSULE    Take 1 capsule by mouth daily.   CRANBERRY 500 MG CAPS    Take 1 capsule by mouth daily.    CYANOCOBALAMIN (B-12) 1000 MCG CAPS    Take 1,000 mcg by mouth daily.    CYCLOBENZAPRINE (FLEXERIL) 10 MG TABLET    cyclobenzaprine 10 mg tablet  TAKE 1 TABLET BY MOUTH THREE TIMES A DAY   ELIQUIS 5 MG TABS TABLET    TAKE 1 TABLET BY MOUTH TWICE A DAY   FERROUS SULFATE 325 (65 FE) MG TABLET    Take 325 mg by mouth daily with breakfast.   FLECAINIDE (TAMBOCOR) 100 MG TABLET    Take 1 tablet (100 mg total) by mouth 2 (two) times daily.   GABAPENTIN (NEURONTIN) 400 MG CAPSULE    Take 1 capsule (400 mg total) by mouth 3 (three) times daily.   HYOSCYAMINE (LEVSIN, ANASPAZ) 0.125 MG TABLET    Take 0.125 mg by mouth every 6 (six) hours as needed for bladder spasms or cramping.    LORAZEPAM (ATIVAN) 1 MG TABLET    TAKE 1 TABLET BY MOUTH AT BEDTIME   MAGNESIUM 400 MG TABS    Take 400 mg by mouth daily.    MELATONIN 5 MG TABS    Take 10 mg by mouth at bedtime.   MULTIPLE VITAMIN  (MULTI-VITAMINS) TABS    Take 1 tablet by mouth daily.    OMEGA-3 FATTY ACIDS (FISH OIL) 1200 MG CAPS    Take 1,200 mg by mouth daily.   OXYMETAZOLINE (AFRIN) 0.05 % NASAL SPRAY  Place 2 sprays into both nostrils at bedtime as needed for congestion.   PANTOPRAZOLE (PROTONIX) 40 MG TABLET    TAKE 1 TABLET (40 MG TOTAL) BY MOUTH ONCE DAILY TAKE 30 MINS BEFORE MEAL   QUETIAPINE FUMARATE (SEROQUEL XR) 150 MG 24 HR TABLET    Take 1 tablet (150 mg total) by mouth at bedtime.   SIMVASTATIN (ZOCOR) 20 MG TABLET    TAKE 1 TABLET (20 MG TOTAL) BY MOUTH DAILY.   SOLIFENACIN (VESICARE) 10 MG TABLET    Take 1 tablet (10 mg total) by mouth daily.   VENLAFAXINE XR (EFFEXOR XR) 75 MG 24 HR CAPSULE    Take 3 capsules (225 mg total) by mouth daily.  Modified Medications   Modified Medication Previous Medication   HYDROCODONE-ACETAMINOPHEN (NORCO/VICODIN) 5-325 MG TABLET HYDROcodone-acetaminophen (NORCO/VICODIN) 5-325 MG tablet      Take 1 tablet by mouth 2 (two) times daily at 10 AM and 5 PM.    Take 1 tablet by mouth 2 (two) times daily at 10 AM and 5 PM.  Discontinued Medications   No medications on file   ----------------------------------------------------------------------------------------------------------------------  Follow-up: Return in about 2 months (around 08/22/2017) for evaluation, med refill.    [name], MD

## 2017-06-24 ENCOUNTER — Encounter: Payer: Self-pay | Admitting: Family Medicine

## 2017-06-24 NOTE — Assessment & Plan Note (Signed)
Better on recheck. Continue current regimen. Continue to monitor. Call with any concerns.  

## 2017-06-25 ENCOUNTER — Other Ambulatory Visit: Payer: Self-pay | Admitting: Family Medicine

## 2017-06-25 ENCOUNTER — Other Ambulatory Visit: Payer: Self-pay

## 2017-06-25 DIAGNOSIS — F419 Anxiety disorder, unspecified: Secondary | ICD-10-CM

## 2017-06-25 DIAGNOSIS — F1190 Opioid use, unspecified, uncomplicated: Secondary | ICD-10-CM

## 2017-06-25 DIAGNOSIS — G894 Chronic pain syndrome: Secondary | ICD-10-CM

## 2017-06-25 DIAGNOSIS — F3342 Major depressive disorder, recurrent, in full remission: Secondary | ICD-10-CM

## 2017-06-27 ENCOUNTER — Ambulatory Visit: Payer: Medicare Other | Admitting: Anesthesiology

## 2017-06-27 NOTE — Telephone Encounter (Signed)
LOV  06/21/17  Dr. Jeananne Rama Last refill 05/16/16 # 90  With 4 refills

## 2017-06-29 LAB — TOXASSURE SELECT 13 (MW), URINE

## 2017-07-02 ENCOUNTER — Encounter: Payer: Self-pay | Admitting: Family Medicine

## 2017-07-02 ENCOUNTER — Ambulatory Visit (INDEPENDENT_AMBULATORY_CARE_PROVIDER_SITE_OTHER): Payer: Medicare Other | Admitting: Family Medicine

## 2017-07-02 VITALS — BP 128/80 | HR 78 | Ht 69.0 in | Wt 295.0 lb

## 2017-07-02 DIAGNOSIS — F3342 Major depressive disorder, recurrent, in full remission: Secondary | ICD-10-CM

## 2017-07-02 DIAGNOSIS — Z7189 Other specified counseling: Secondary | ICD-10-CM

## 2017-07-02 DIAGNOSIS — Z1329 Encounter for screening for other suspected endocrine disorder: Secondary | ICD-10-CM | POA: Diagnosis not present

## 2017-07-02 DIAGNOSIS — Z0001 Encounter for general adult medical examination with abnormal findings: Secondary | ICD-10-CM

## 2017-07-02 DIAGNOSIS — E785 Hyperlipidemia, unspecified: Secondary | ICD-10-CM

## 2017-07-02 DIAGNOSIS — Z125 Encounter for screening for malignant neoplasm of prostate: Secondary | ICD-10-CM

## 2017-07-02 DIAGNOSIS — I10 Essential (primary) hypertension: Secondary | ICD-10-CM | POA: Diagnosis not present

## 2017-07-02 DIAGNOSIS — R600 Localized edema: Secondary | ICD-10-CM | POA: Insufficient documentation

## 2017-07-02 DIAGNOSIS — F419 Anxiety disorder, unspecified: Secondary | ICD-10-CM | POA: Diagnosis not present

## 2017-07-02 LAB — URINALYSIS, ROUTINE W REFLEX MICROSCOPIC
BILIRUBIN UA: NEGATIVE
GLUCOSE, UA: NEGATIVE
Ketones, UA: NEGATIVE
Nitrite, UA: NEGATIVE
PROTEIN UA: NEGATIVE
RBC UA: NEGATIVE
Specific Gravity, UA: 1.025 (ref 1.005–1.030)
UUROB: 0.2 mg/dL (ref 0.2–1.0)
pH, UA: 5 (ref 5.0–7.5)

## 2017-07-02 LAB — MICROSCOPIC EXAMINATION: Bacteria, UA: NONE SEEN

## 2017-07-02 MED ORDER — QUETIAPINE FUMARATE ER 150 MG PO TB24: 150.0000 mg | ORAL_TABLET | Freq: Every day | ORAL | 4 refills | Status: DC

## 2017-07-02 MED ORDER — SOLIFENACIN SUCCINATE 10 MG PO TABS: 10.0000 mg | ORAL_TABLET | Freq: Every day | ORAL | 4 refills | Status: DC

## 2017-07-02 MED ORDER — SIMVASTATIN 20 MG PO TABS: 20.0000 mg | ORAL_TABLET | Freq: Every day | ORAL | 4 refills | Status: DC

## 2017-07-02 MED ORDER — LORAZEPAM 1 MG PO TABS: 1.0000 mg | ORAL_TABLET | Freq: Every day | ORAL | 1 refills | Status: DC

## 2017-07-02 MED ORDER — GABAPENTIN 100 MG PO CAPS: 100.0000 mg | ORAL_CAPSULE | Freq: Three times a day (TID) | ORAL | 3 refills | Status: DC

## 2017-07-02 MED ORDER — TERBINAFINE HCL 250 MG PO TABS: 250.0000 mg | ORAL_TABLET | Freq: Every day | ORAL | 2 refills | Status: DC

## 2017-07-02 MED ORDER — APIXABAN 5 MG PO TABS: 5.0000 mg | ORAL_TABLET | Freq: Two times a day (BID) | ORAL | 4 refills | Status: DC

## 2017-07-02 MED ORDER — VENLAFAXINE HCL ER 75 MG PO CP24: 225.0000 mg | ORAL_CAPSULE | Freq: Every day | ORAL | 4 refills | Status: DC

## 2017-07-02 MED ORDER — GABAPENTIN 300 MG PO CAPS: 300.0000 mg | ORAL_CAPSULE | Freq: Three times a day (TID) | ORAL | 3 refills | Status: DC

## 2017-07-02 NOTE — Assessment & Plan Note (Signed)
A voluntary discussion about advanced care planning including explanation and discussion of advanced directives was extentively discussed with the patient.  Explained about the healthcare proxy and living will was reviewed and packet with forms with expiration of how to fill them out was given.  Time spent: Encounter 16+ min individuals present: Patient and wife 

## 2017-07-02 NOTE — Progress Notes (Signed)
BP 128/80 (BP Location: Left Arm)   Pulse 78   Ht 5\' 9"  (1.753 m)   Wt 295 lb (133.8 kg)   SpO2 95%   BMI 43.56 kg/m    Subjective:    Patient ID: Nicholas [name], male    DOB: 09/22/1945, 72 y.o.   MRN: [id number]  HPI: Nicholas [name] is a 72 y.o. male  Chief Complaint  Patient presents with  . Annual Exam  Patient with orthostatic hypotension event couple weeks ago has recovered trying to drink more fluids. Concerned about gabapentin and was it for does have some restless leg and some neuropathy symptoms.  Reviewed taking 402 tablets in the morning and 400 in the evening. Discussed onychomycosis on both fingernails and toenails requesting medication reviewed Lamisil treatment and usage.  Also reviewed expectations. Patient other problems stable taking Eliquis without problems. Lorazepam takes faithfully every night without issues. Venlafaxine quetiapine does well. Also taking simvastatin without problems. Also has dysphasia but esophageal dilation was done just 2 months ago.  Relevant past medical, surgical, family and social history reviewed and updated as indicated. Interim medical history since our last visit reviewed. Allergies and medications reviewed and updated.  Review of Systems  Constitutional: Negative.   HENT: Negative.   Eyes: Negative.   Respiratory: Negative.   Cardiovascular: Negative.   Gastrointestinal: Negative.   Endocrine: Negative.   Genitourinary: Negative.   Musculoskeletal: Negative.   Skin: Negative.   Allergic/Immunologic: Negative.   Neurological: Negative.   Hematological: Negative.   Psychiatric/Behavioral: Negative.     Per HPI unless specifically indicated above     Objective:    BP 128/80 (BP Location: Left Arm)   Pulse 78   Ht 5\' 9"  (1.753 m)   Wt 295 lb (133.8 kg)   SpO2 95%   BMI 43.56 kg/m   Wt Readings from Last 3 Encounters:  07/02/17 295 lb (133.8 kg)  06/22/17 294 lb (133.4 kg)  06/21/17 289 lb (131.1 kg)      Physical Exam  Constitutional: He is oriented to person, place, and time. He appears well-developed and well-nourished.  HENT:  Head: Normocephalic and atraumatic.  Right Ear: External ear normal.  Left Ear: External ear normal.  Eyes: Pupils are equal, round, and reactive to light. Conjunctivae and EOM are normal.  Neck: Normal range of motion. Neck supple.  Cardiovascular: Normal rate, regular rhythm, normal heart sounds and intact distal pulses.  Pulmonary/Chest: Effort normal and breath sounds normal.  Abdominal: Soft. Bowel sounds are normal. There is no splenomegaly or hepatomegaly.  Genitourinary: Rectum normal, prostate normal and penis normal.  Musculoskeletal: Normal range of motion.  Neurological: He is alert and oriented to person, place, and time. He has normal reflexes.  Skin: No rash noted. No erythema.  Psychiatric: He has a normal mood and affect. His behavior is normal. Judgment and thought content normal.    Results for orders placed or performed in visit on 19/37/90  Basic metabolic panel  Result Value Ref Range   Glucose 168 (H) 65 - 99 mg/dL   BUN 15 8 - 27 mg/dL   Creatinine, Ser 1.00 0.76 - 1.27 mg/dL   GFR calc non Af Amer 75 >59 mL/min/1.73   GFR calc Af Amer 87 >59 mL/min/1.73   BUN/Creatinine Ratio 15 10 - 24   Sodium 142 134 - 144 mmol/L   Potassium 4.4 3.5 - 5.2 mmol/L   Chloride 102 96 - 106 mmol/L   CO2 25 20 -  29 mmol/L   Calcium 9.4 8.6 - 10.2 mg/dL      Assessment & Plan:   Problem List Items Addressed This Visit      Cardiovascular and Mediastinum   HTN (hypertension) - Primary (Chronic)   Relevant Medications   simvastatin (ZOCOR) 20 MG tablet   apixaban (ELIQUIS) 5 MG TABS tablet   Other Relevant Orders   CBC with Differential/Platelet   Comprehensive metabolic panel   Lipid panel   Urinalysis, Routine w reflex microscopic     Other   Dyslipidemia (Chronic)   Relevant Medications   simvastatin (ZOCOR) 20 MG tablet   Other  Relevant Orders   CBC with Differential/Platelet   Comprehensive metabolic panel   Lipid panel   Urinalysis, Routine w reflex microscopic   Advanced care planning/counseling discussion    A voluntary discussion about advanced care planning including explanation and discussion of advanced directives was extentively discussed with the patient.  Explained about the healthcare proxy and living will was reviewed and packet with forms with expiration of how to fill them out was given.  Time spent: Encounter 16+ min individuals present: Patient and wife.      Depression   Relevant Medications   venlafaxine XR (EFFEXOR XR) 75 MG 24 hr capsule   QUEtiapine Fumarate (SEROQUEL XR) 150 MG 24 hr tablet   LORazepam (ATIVAN) 1 MG tablet   Anxiety   Relevant Medications   venlafaxine XR (EFFEXOR XR) 75 MG 24 hr capsule   QUEtiapine Fumarate (SEROQUEL XR) 150 MG 24 hr tablet   LORazepam (ATIVAN) 1 MG tablet    Other Visit Diagnoses    Thyroid disorder screen       Relevant Orders   TSH   Prostate cancer screening           Follow up plan: Return in about 6 months (around 01/01/2018) for BMP,  Lipids, ALT, AST.

## 2017-07-03 ENCOUNTER — Encounter: Payer: Self-pay | Admitting: Family Medicine

## 2017-07-03 LAB — CBC WITH DIFFERENTIAL/PLATELET
Basophils Absolute: 0 x10E3/uL (ref 0.0–0.2)
Basos: 1 %
EOS (ABSOLUTE): 0.5 x10E3/uL — ABNORMAL HIGH (ref 0.0–0.4)
Eos: 8 %
Hematocrit: 44.2 % (ref 37.5–51.0)
Hemoglobin: 14.6 g/dL (ref 13.0–17.7)
Immature Grans (Abs): 0.1 x10E3/uL (ref 0.0–0.1)
Immature Granulocytes: 1 %
Lymphocytes Absolute: 1.5 x10E3/uL (ref 0.7–3.1)
Lymphs: 25 %
MCH: 30.9 pg (ref 26.6–33.0)
MCHC: 33 g/dL (ref 31.5–35.7)
MCV: 94 fL (ref 79–97)
Monocytes Absolute: 0.4 x10E3/uL (ref 0.1–0.9)
Monocytes: 8 %
Neutrophils Absolute: 3.4 x10E3/uL (ref 1.4–7.0)
Neutrophils: 57 %
Platelets: 181 x10E3/uL (ref 150–450)
RBC: 4.72 x10E6/uL (ref 4.14–5.80)
RDW: 13.6 % (ref 12.3–15.4)
WBC: 5.9 x10E3/uL (ref 3.4–10.8)

## 2017-07-03 LAB — COMPREHENSIVE METABOLIC PANEL
A/G RATIO: 2 (ref 1.2–2.2)
ALT: 18 IU/L (ref 0–44)
AST: 27 IU/L (ref 0–40)
Albumin: 4.7 g/dL (ref 3.5–4.8)
Alkaline Phosphatase: 46 IU/L (ref 39–117)
BUN/Creatinine Ratio: 20 (ref 10–24)
BUN: 22 mg/dL (ref 8–27)
Bilirubin Total: 0.6 mg/dL (ref 0.0–1.2)
CALCIUM: 9.5 mg/dL (ref 8.6–10.2)
CO2: 23 mmol/L (ref 20–29)
CREATININE: 1.08 mg/dL (ref 0.76–1.27)
Chloride: 101 mmol/L (ref 96–106)
GFR calc non Af Amer: 68 mL/min/{1.73_m2} (ref >59)
GFR, EST AFRICAN AMERICAN: 79 mL/min/{1.73_m2} (ref >59)
GLOBULIN, TOTAL: 2.4 g/dL (ref 1.5–4.5)
Glucose: 124 mg/dL — ABNORMAL HIGH (ref 65–99)
POTASSIUM: 4.8 mmol/L (ref 3.5–5.2)
SODIUM: 141 mmol/L (ref 134–144)
Total Protein: 7.1 g/dL (ref 6.0–8.5)

## 2017-07-03 LAB — LIPID PANEL
Chol/HDL Ratio: 2.9 ratio (ref 0.0–5.0)
Cholesterol, Total: 170 mg/dL (ref 100–199)
HDL: 59 mg/dL (ref >39)
LDL CALC: 95 mg/dL (ref 0–99)
TRIGLYCERIDES: 78 mg/dL (ref 0–149)
VLDL Cholesterol Cal: 16 mg/dL (ref 5–40)

## 2017-07-03 LAB — TSH: TSH: 1.26 u[IU]/mL (ref 0.450–4.500)

## 2017-07-16 LAB — EXERCISE TOLERANCE TEST
CHL CUP MPHR: 149 {beats}/min
CSEPED: 1 min
CSEPEW: 3.5 METS
CSEPHR: 70 %
CSEPPHR: 105 {beats}/min
Exercise duration (sec): 43 s
Rest HR: 101 {beats}/min

## 2017-07-24 ENCOUNTER — Other Ambulatory Visit: Payer: Self-pay | Admitting: Family Medicine

## 2017-08-02 ENCOUNTER — Telehealth: Payer: Self-pay

## 2017-08-08 NOTE — Telephone Encounter (Signed)
error 

## 2017-08-12 ENCOUNTER — Other Ambulatory Visit: Payer: Self-pay | Admitting: Family Medicine

## 2017-08-12 DIAGNOSIS — T84098D Other mechanical complication of other internal joint prosthesis, subsequent encounter: Secondary | ICD-10-CM

## 2017-08-12 DIAGNOSIS — Z96649 Presence of unspecified artificial hip joint: Principal | ICD-10-CM

## 2017-08-21 ENCOUNTER — Ambulatory Visit: Payer: Medicare Other | Attending: Anesthesiology | Admitting: Anesthesiology

## 2017-08-21 ENCOUNTER — Encounter: Payer: Self-pay | Admitting: Anesthesiology

## 2017-08-21 ENCOUNTER — Other Ambulatory Visit: Payer: Self-pay

## 2017-08-21 VITALS — BP 155/83 | HR 80 | Temp 98.3°F | Resp 16 | Ht 71.0 in | Wt 297.0 lb

## 2017-08-21 DIAGNOSIS — M5136 Other intervertebral disc degeneration, lumbar region: Secondary | ICD-10-CM | POA: Diagnosis not present

## 2017-08-21 DIAGNOSIS — M79643 Pain in unspecified hand: Secondary | ICD-10-CM | POA: Diagnosis not present

## 2017-08-21 DIAGNOSIS — Z79899 Other long term (current) drug therapy: Secondary | ICD-10-CM | POA: Insufficient documentation

## 2017-08-21 DIAGNOSIS — M5432 Sciatica, left side: Secondary | ICD-10-CM

## 2017-08-21 DIAGNOSIS — G894 Chronic pain syndrome: Secondary | ICD-10-CM | POA: Insufficient documentation

## 2017-08-21 DIAGNOSIS — M5431 Sciatica, right side: Secondary | ICD-10-CM

## 2017-08-21 DIAGNOSIS — M170 Bilateral primary osteoarthritis of knee: Secondary | ICD-10-CM | POA: Insufficient documentation

## 2017-08-21 DIAGNOSIS — M79641 Pain in right hand: Secondary | ICD-10-CM | POA: Diagnosis not present

## 2017-08-21 DIAGNOSIS — M47817 Spondylosis without myelopathy or radiculopathy, lumbosacral region: Secondary | ICD-10-CM

## 2017-08-21 DIAGNOSIS — M79642 Pain in left hand: Secondary | ICD-10-CM | POA: Diagnosis not present

## 2017-08-21 DIAGNOSIS — Z79891 Long term (current) use of opiate analgesic: Secondary | ICD-10-CM | POA: Diagnosis not present

## 2017-08-21 DIAGNOSIS — G8929 Other chronic pain: Secondary | ICD-10-CM

## 2017-08-21 DIAGNOSIS — F1190 Opioid use, unspecified, uncomplicated: Secondary | ICD-10-CM

## 2017-08-21 DIAGNOSIS — M1611 Unilateral primary osteoarthritis, right hip: Secondary | ICD-10-CM | POA: Insufficient documentation

## 2017-08-21 MED ORDER — HYDROCODONE-ACETAMINOPHEN 5-325 MG PO TABS: 1.0000 | ORAL_TABLET | Freq: Two times a day (BID) | ORAL | 0 refills | Status: DC

## 2017-08-21 NOTE — Progress Notes (Signed)
Nursing Pain Medication Assessment:  Safety precautions to be maintained throughout the outpatient stay will include: orient to surroundings, keep bed in low position, maintain call bell within reach at all times, provide assistance with transfer out of bed and ambulation.  Medication Inspection Compliance: Pill count conducted under aseptic conditions, in front of the patient. Neither the pills nor the bottle was removed from the patient's sight at any time. Once count was completed pills were immediately returned to the patient in their original bottle.  Medication: Hydrocodone/APAP Pill/Patch Count: 24 of 60 pills remain Pill/Patch Appearance: Markings consistent with prescribed medication Bottle Appearance: Standard pharmacy container. Clearly labeled. Filled Date: 07 / 11 / 2019 Last Medication intake:  Today

## 2017-08-21 NOTE — Progress Notes (Signed)
Subjective:  Patient ID: Nicholas [name], male    DOB: 1945-06-17  Age: 72 y.o. MRN: [id number]  CC: Hand Pain (bilateral)   Procedure: None  HPI Nicholas [name] presents for reevaluation.  He was last seen 2 months ago and core has been doing reasonably well with his low back pain knee pain and lower extremity pain.  His hand pain is also been well controlled with his current regimen.  Based on his narcotic assessment sheet he continues to derive good functional lifestyle improvement with his medications.  Otherwise he is in his usual state of health.  He is tolerating his Neurontin and Flexeril well and uses his hydrocodone twice a day without difficulty.  Otherwise the quality characteristic distribution of his diffuse body pain is stable in nature.  No new weakness or bowel bladder dysfunction is noted.  Outpatient Medications Prior to Visit  Medication Sig Dispense Refill  . acetaminophen (TYLENOL) 500 MG tablet Take 2,000 mg by mouth daily as needed for moderate pain or headache.    Marland Kitchen apixaban (ELIQUIS) 5 MG TABS tablet Take 1 tablet (5 mg total) by mouth 2 (two) times daily. 180 tablet 4  . ascorbic acid (VITAMIN C) 250 MG tablet Take 250 mg by mouth daily.     Marland Kitchen b complex vitamins capsule Take 1 capsule by mouth daily.    . Cranberry 500 MG CAPS Take 1 capsule by mouth daily.     . Cyanocobalamin (B-12) 1000 MCG CAPS Take 1,000 mcg by mouth daily.     . cyclobenzaprine (FLEXERIL) 10 MG tablet cyclobenzaprine 10 mg tablet  TAKE 1 TABLET BY MOUTH THREE TIMES A DAY    . ferrous sulfate 325 (65 FE) MG tablet Take 325 mg by mouth daily with breakfast.    . flecainide (TAMBOCOR) 100 MG tablet Take 1 tablet (100 mg total) by mouth 2 (two) times daily. 180 tablet 3  . gabapentin (NEURONTIN) 100 MG capsule Take 1 capsule (100 mg total) by mouth 3 (three) times daily. 90 capsule 3  . gabapentin (NEURONTIN) 300 MG capsule Take 1 capsule (300 mg total) by mouth 3 (three) times daily. 90 capsule 3   . hyoscyamine (LEVSIN, ANASPAZ) 0.125 MG tablet Take 0.125 mg by mouth every 6 (six) hours as needed for bladder spasms or cramping.     Marland Kitchen LORazepam (ATIVAN) 1 MG tablet Take 1 tablet (1 mg total) by mouth at bedtime. 90 tablet 1  . Magnesium 400 MG TABS Take 400 mg by mouth daily.     . Melatonin 5 MG TABS Take 10 mg by mouth at bedtime.    . Multiple Vitamin (MULTI-VITAMINS) TABS Take 1 tablet by mouth daily.     . Omega-3 Fatty Acids (FISH OIL) 1200 MG CAPS Take 1,200 mg by mouth daily.    Marland Kitchen oxymetazoline (AFRIN) 0.05 % nasal spray Place 2 sprays into both nostrils at bedtime as needed for congestion.    . pantoprazole (PROTONIX) 40 MG tablet TAKE 1 TABLET (40 MG TOTAL) BY MOUTH ONCE DAILY TAKE 30 MINS BEFORE MEAL  6  . QUEtiapine Fumarate (SEROQUEL XR) 150 MG 24 hr tablet Take 1 tablet (150 mg total) by mouth at bedtime. 90 tablet 4  . simvastatin (ZOCOR) 20 MG tablet Take 1 tablet (20 mg total) by mouth daily. 90 tablet 4  . solifenacin (VESICARE) 10 MG tablet Take 1 tablet (10 mg total) by mouth daily. 90 tablet 4  . venlafaxine XR (EFFEXOR XR) 75  MG 24 hr capsule Take 3 capsules (225 mg total) by mouth daily. 270 capsule 4  . HYDROcodone-acetaminophen (NORCO/VICODIN) 5-325 MG tablet Take 1 tablet by mouth 2 (two) times daily at 10 AM and 5 PM. 60 tablet 0  . terbinafine (LAMISIL) 250 MG tablet Take 1 tablet (250 mg total) by mouth daily. (Patient not taking: Reported on 08/21/2017) 30 tablet 2   No facility-administered medications prior to visit.     Review of Systems CNS: No confusion or sedation Cardiac: No angina or palpitations GI: No abdominal pain or constipation Constitutional: No nausea vomiting fevers or chills  Objective:  BP (!) 155/83 (BP Location: Left Arm, Patient Position: Sitting, Cuff Size: Large)   Pulse 80   Temp 98.3 F (36.8 C) (Oral)   Resp 16   Ht 5\' 11"  (1.803 m)   Wt 297 lb (134.7 kg)   SpO2 98%   BMI 41.42 kg/m    BP Readings from Last 3  Encounters:  08/21/17 (!) 155/83  07/02/17 128/80  06/22/17 (!) 157/92     Wt Readings from Last 3 Encounters:  08/21/17 297 lb (134.7 kg)  07/02/17 295 lb (133.8 kg)  06/22/17 294 lb (133.4 kg)     Physical Exam Pt is alert and oriented PERRL EOMI HEART IS RRR no murmur or rub LCTA no wheezing or rales MUSCULOSKELETAL feels some bilateral paraspinous muscle tenderness in the lumbar region but his lower extremity muscle tone and bulk is good with no change in lower extremity strength or function.  Labs  No results found for: HGBA1C Lab Results  Component Value Date   LDLCALC 95 07/02/2017   CREATININE 1.08 07/02/2017    -------------------------------------------------------------------------------------------------------------------- Lab Results  Component Value Date   WBC 5.9 07/02/2017   HGB 14.6 07/02/2017   HCT 44.2 07/02/2017   PLT 181 07/02/2017   GLUCOSE 124 (H) 07/02/2017   CHOL 170 07/02/2017   TRIG 78 07/02/2017   HDL 59 07/02/2017   LDLCALC 95 07/02/2017   ALT 18 07/02/2017   AST 27 07/02/2017   NA 141 07/02/2017   K 4.8 07/02/2017   CL 101 07/02/2017   CREATININE 1.08 07/02/2017   BUN 22 07/02/2017   CO2 23 07/02/2017   TSH 1.260 07/02/2017   INR 1.2 02/16/2014    --------------------------------------------------------------------------------------------------------------------- Ct Head Wo Contrast  Result Date: 06/11/2017 CLINICAL DATA:  Fall, dizziness, posterior scalp laceration, on Eliquis EXAM: CT HEAD WITHOUT CONTRAST CT CERVICAL SPINE WITHOUT CONTRAST TECHNIQUE: Multidetector CT imaging of the head and cervical spine was performed following the standard protocol without intravenous contrast. Multiplanar CT image reconstructions of the cervical spine were also generated. COMPARISON:  None. FINDINGS: CT HEAD FINDINGS Brain: No evidence of acute infarction, hemorrhage, hydrocephalus, extra-axial collection or mass lesion/mass effect.  Subcortical white matter and periventricular small vessel ischemic changes. Vascular: Intracranial atherosclerosis. Skull: Normal. Negative for fracture or focal lesion. Sinuses/Orbits: The visualized paranasal sinuses are essentially clear. The mastoid air cells are unopacified. Other: Soft tissue laceration overlying the right parietal scalp (series 4/image 67). CT CERVICAL SPINE FINDINGS Alignment: Mild straightening of the cervical spine, likely positional. Skull base and vertebrae: No acute fracture. No primary bone lesion or focal pathologic process. Soft tissues and spinal canal: No prevertebral fluid or swelling. No visible canal hematoma. Disc levels: Mild degenerative changes of the mid cervical spine. Spinal canal is patent. Upper chest: Visualized lung apices are clear. Other: Visualized thyroid is unremarkable. IMPRESSION: Soft tissue laceration overlying the right parietal scalp.  No evidence of calvarial fracture. No evidence of acute intracranial abnormality. Small vessel ischemic changes. No evidence of traumatic injury to the cervical spine. Mild degenerative changes. Electronically Signed   By: Julian Hy M.D.   On: 06/11/2017 15:46   Ct Cervical Spine Wo Contrast  Result Date: 06/11/2017 CLINICAL DATA:  Fall, dizziness, posterior scalp laceration, on Eliquis EXAM: CT HEAD WITHOUT CONTRAST CT CERVICAL SPINE WITHOUT CONTRAST TECHNIQUE: Multidetector CT imaging of the head and cervical spine was performed following the standard protocol without intravenous contrast. Multiplanar CT image reconstructions of the cervical spine were also generated. COMPARISON:  None. FINDINGS: CT HEAD FINDINGS Brain: No evidence of acute infarction, hemorrhage, hydrocephalus, extra-axial collection or mass lesion/mass effect. Subcortical white matter and periventricular small vessel ischemic changes. Vascular: Intracranial atherosclerosis. Skull: Normal. Negative for fracture or focal lesion. Sinuses/Orbits:  The visualized paranasal sinuses are essentially clear. The mastoid air cells are unopacified. Other: Soft tissue laceration overlying the right parietal scalp (series 4/image 67). CT CERVICAL SPINE FINDINGS Alignment: Mild straightening of the cervical spine, likely positional. Skull base and vertebrae: No acute fracture. No primary bone lesion or focal pathologic process. Soft tissues and spinal canal: No prevertebral fluid or swelling. No visible canal hematoma. Disc levels: Mild degenerative changes of the mid cervical spine. Spinal canal is patent. Upper chest: Visualized lung apices are clear. Other: Visualized thyroid is unremarkable. IMPRESSION: Soft tissue laceration overlying the right parietal scalp. No evidence of calvarial fracture. No evidence of acute intracranial abnormality. Small vessel ischemic changes. No evidence of traumatic injury to the cervical spine. Mild degenerative changes. Electronically Signed   By: Julian Hy M.D.   On: 06/11/2017 15:46     Assessment & Plan:   Nicholas [name] was seen today for hand pain.  Diagnoses and all orders for this visit:  Chronic pain syndrome  Bilateral sciatica  DDD (degenerative disc disease), lumbar  Osteoarthritis of right hip, unspecified osteoarthritis type  Facet arthritis of lumbosacral region  Chronic, continuous use of opioids  Chronic hand pain, unspecified laterality  Primary osteoarthritis of both knees  Other orders -     Discontinue: HYDROcodone-acetaminophen (NORCO/VICODIN) 5-325 MG tablet; Take 1 tablet by mouth 2 (two) times daily at 10 AM and 5 PM. -     HYDROcodone-acetaminophen (NORCO/VICODIN) 5-325 MG tablet; Take 1 tablet by mouth 2 (two) times daily at 10 AM and 5 PM.        ----------------------------------------------------------------------------------------------------------------------  Problem List Items Addressed This Visit    None    Visit Diagnoses    Chronic pain syndrome    -  Primary    Bilateral sciatica       DDD (degenerative disc disease), lumbar       Relevant Medications   HYDROcodone-acetaminophen (NORCO/VICODIN) 5-325 MG tablet   Osteoarthritis of right hip, unspecified osteoarthritis type       Relevant Medications   HYDROcodone-acetaminophen (NORCO/VICODIN) 5-325 MG tablet   Facet arthritis of lumbosacral region       Relevant Medications   HYDROcodone-acetaminophen (NORCO/VICODIN) 5-325 MG tablet   Chronic, continuous use of opioids       Chronic hand pain, unspecified laterality       Relevant Medications   HYDROcodone-acetaminophen (NORCO/VICODIN) 5-325 MG tablet   Primary osteoarthritis of both knees       Relevant Medications   HYDROcodone-acetaminophen (NORCO/VICODIN) 5-325 MG tablet        ----------------------------------------------------------------------------------------------------------------------  1. Chronic pain syndrome We have reviewed the [facility] practitioner database  information and it is appropriate.  We will give him refills on his hydrocodone for August 7 and September 6.  We will schedule him for return to clinic in 2 months for reevaluation at that time.  2. Bilateral sciatica Tinea with core stretching strengthening exercises as previously reviewed.  3. DDD (degenerative disc disease), lumbar As above.  Should he had any recurrence of the radicular type pain that he has experienced in the past we will schedule him for repeat set of epidurals.  His last epidural set was in September 2018.  4. Osteoarthritis of right hip, unspecified osteoarthritis type As above  5. Facet arthritis of lumbosacral region As above  6. Chronic, continuous use of opioids As above  7. Chronic hand pain, unspecified laterality   8. Primary osteoarthritis of both knees     ----------------------------------------------------------------------------------------------------------------------  I am having Nicholas [name] "Nicholas [name]"  maintain his oxymetazoline, B-12, ferrous sulfate, hyoscyamine, Fish Oil, acetaminophen, ascorbic acid, Cranberry, MULTI-VITAMINS, Magnesium, flecainide, pantoprazole, b complex vitamins, Melatonin, cyclobenzaprine, venlafaxine XR, solifenacin, simvastatin, QUEtiapine Fumarate, LORazepam, apixaban, gabapentin, gabapentin, terbinafine, and HYDROcodone-acetaminophen.   Meds ordered this encounter  Medications  . DISCONTD: HYDROcodone-acetaminophen (NORCO/VICODIN) 5-325 MG tablet    Sig: Take 1 tablet by mouth 2 (two) times daily at 10 AM and 5 PM.    Dispense:  60 tablet    Refill:  0    Do not fill until [id number]  . HYDROcodone-acetaminophen (NORCO/VICODIN) 5-325 MG tablet    Sig: Take 1 tablet by mouth 2 (two) times daily at 10 AM and 5 PM.    Dispense:  60 tablet    Refill:  0    Do not fill until [id number]   Patient's Medications  New Prescriptions   No medications on file  Previous Medications   ACETAMINOPHEN (TYLENOL) 500 MG TABLET    Take 2,000 mg by mouth daily as needed for moderate pain or headache.   APIXABAN (ELIQUIS) 5 MG TABS TABLET    Take 1 tablet (5 mg total) by mouth 2 (two) times daily.   ASCORBIC ACID (VITAMIN C) 250 MG TABLET    Take 250 mg by mouth daily.    B COMPLEX VITAMINS CAPSULE    Take 1 capsule by mouth daily.   CRANBERRY 500 MG CAPS    Take 1 capsule by mouth daily.    CYANOCOBALAMIN (B-12) 1000 MCG CAPS    Take 1,000 mcg by mouth daily.    CYCLOBENZAPRINE (FLEXERIL) 10 MG TABLET    cyclobenzaprine 10 mg tablet  TAKE 1 TABLET BY MOUTH THREE TIMES A DAY   FERROUS SULFATE 325 (65 FE) MG TABLET    Take 325 mg by mouth daily with breakfast.   FLECAINIDE (TAMBOCOR) 100 MG TABLET    Take 1 tablet (100 mg total) by mouth 2 (two) times daily.   GABAPENTIN (NEURONTIN) 100 MG CAPSULE    Take 1 capsule (100 mg total) by mouth 3 (three) times daily.   GABAPENTIN (NEURONTIN) 300 MG CAPSULE    Take 1 capsule (300 mg total) by mouth 3 (three) times daily.   HYOSCYAMINE  (LEVSIN, ANASPAZ) 0.125 MG TABLET    Take 0.125 mg by mouth every 6 (six) hours as needed for bladder spasms or cramping.    LORAZEPAM (ATIVAN) 1 MG TABLET    Take 1 tablet (1 mg total) by mouth at bedtime.   MAGNESIUM 400 MG TABS    Take 400 mg by mouth daily.    MELATONIN 5 MG  TABS    Take 10 mg by mouth at bedtime.   MULTIPLE VITAMIN (MULTI-VITAMINS) TABS    Take 1 tablet by mouth daily.    OMEGA-3 FATTY ACIDS (FISH OIL) 1200 MG CAPS    Take 1,200 mg by mouth daily.   OXYMETAZOLINE (AFRIN) 0.05 % NASAL SPRAY    Place 2 sprays into both nostrils at bedtime as needed for congestion.   PANTOPRAZOLE (PROTONIX) 40 MG TABLET    TAKE 1 TABLET (40 MG TOTAL) BY MOUTH ONCE DAILY TAKE 30 MINS BEFORE MEAL   QUETIAPINE FUMARATE (SEROQUEL XR) 150 MG 24 HR TABLET    Take 1 tablet (150 mg total) by mouth at bedtime.   SIMVASTATIN (ZOCOR) 20 MG TABLET    Take 1 tablet (20 mg total) by mouth daily.   SOLIFENACIN (VESICARE) 10 MG TABLET    Take 1 tablet (10 mg total) by mouth daily.   TERBINAFINE (LAMISIL) 250 MG TABLET    Take 1 tablet (250 mg total) by mouth daily.   VENLAFAXINE XR (EFFEXOR XR) 75 MG 24 HR CAPSULE    Take 3 capsules (225 mg total) by mouth daily.  Modified Medications   Modified Medication Previous Medication   HYDROCODONE-ACETAMINOPHEN (NORCO/VICODIN) 5-325 MG TABLET HYDROcodone-acetaminophen (NORCO/VICODIN) 5-325 MG tablet      Take 1 tablet by mouth 2 (two) times daily at 10 AM and 5 PM.    Take 1 tablet by mouth 2 (two) times daily at 10 AM and 5 PM.  Discontinued Medications   No medications on file   ----------------------------------------------------------------------------------------------------------------------  Follow-up: Return in about 2 months (around 10/22/2017) for evaluation, med refill.    [name], MD

## 2017-08-28 ENCOUNTER — Other Ambulatory Visit: Payer: Self-pay | Admitting: Family Medicine

## 2017-09-12 ENCOUNTER — Ambulatory Visit (INDEPENDENT_AMBULATORY_CARE_PROVIDER_SITE_OTHER): Payer: Medicare Other | Admitting: *Deleted

## 2017-09-12 DIAGNOSIS — I442 Atrioventricular block, complete: Secondary | ICD-10-CM | POA: Diagnosis not present

## 2017-09-14 ENCOUNTER — Encounter: Payer: Self-pay | Admitting: Cardiology

## 2017-09-14 NOTE — Progress Notes (Signed)
Remote pacemaker transmission.   

## 2017-10-16 ENCOUNTER — Encounter: Payer: Self-pay | Admitting: Internal Medicine

## 2017-10-16 ENCOUNTER — Ambulatory Visit (INDEPENDENT_AMBULATORY_CARE_PROVIDER_SITE_OTHER): Payer: Medicare Other | Admitting: Internal Medicine

## 2017-10-16 VITALS — BP 116/78 | HR 80 | Ht 71.0 in | Wt 287.0 lb

## 2017-10-16 DIAGNOSIS — Z950 Presence of cardiac pacemaker: Secondary | ICD-10-CM | POA: Diagnosis not present

## 2017-10-16 DIAGNOSIS — I480 Paroxysmal atrial fibrillation: Secondary | ICD-10-CM | POA: Diagnosis not present

## 2017-10-16 DIAGNOSIS — I442 Atrioventricular block, complete: Secondary | ICD-10-CM

## 2017-10-16 LAB — CUP PACEART REMOTE DEVICE CHECK
Date Time Interrogation Session: 20190924080314
Implantable Lead Implant Date: 20180124
Implantable Lead Location: 753859
Implantable Lead Model: 3830
MDC IDC LEAD IMPLANT DT: 20180124
MDC IDC LEAD LOCATION: 753860
MDC IDC PG IMPLANT DT: 20180124

## 2017-10-16 NOTE — Patient Instructions (Addendum)
Medication Instructions: - Your physician recommends that you continue on your current medications as directed. Please refer to the Current Medication list given to you today.  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 This monitoring reduces the number of office visits required to check your device to one time per year. It allows [place] to keep an eye on the functioning of your device to ensure it is working properly. You are scheduled for a device check from home on 12/12/17. You may send your transmission at any time that day. If you have a wireless device, the transmission will be sent automatically. After your physician reviews your transmission, you will receive a postcard with your next transmission date.   - Your physician wants you to follow-up in: 6 months with Dr. Caryl Comes. You will receive a reminder letter in the mail/ call two months in advance. If you don't receive a letter/ call, please call our office to schedule the follow-up appointment.   Any Additional Special Instructions Will Be Listed Below (If Applicable).     If you need a refill on your cardiac medications before your next appointment, please call your pharmacy.

## 2017-10-16 NOTE — Progress Notes (Signed)
Patient Care Team: [name], MD as PCP - General (Family Medicine) [name], MD (Specialist) Hollice Espy, MD as Consulting Physician (Urology) Tanda Rockers, MD as Consulting Physician (Pulmonary Disease) Dionisio David, MD as Consulting Physician (Cardiology)   HPI  Nicholas [name] is a 72 y.o. 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  It was elected to start him on flecainide.   He has obstructive sleep apnea by history but has not been interested in testing or therapy    DATE TEST    10/15    echo   EF 50 %   Mild RV dysfunction   11/17/     echo   EF 60 %    Nl RV function   11/17 Myoview EF 56 [facility])    He has untreated sleep apnea   His also had a history of pulmonary embolism with repeat venous Dopplers 1/16 demonstrated small clots. He has been managed with chronic apixaban  DATE PR interval QRSduration Dose  3/19 228 126 (HIS pacing) 100  9/19      Date Cr Hgb  6/19 1.08 14.6        Exercise tolerance remains improved following restoration of sinus rhythm.  Frequent dizzy spells that are associated with standing.  He has had 2 falls,  Both occurring following standing.  When seen in the emergency room 5/19 "dehydrated "he been out playing golf.  Other episode of syncope was without prior heat exposure.   Past Medical History:  Diagnosis Date  . Anterior urethral stricture   . Anxiety   . Arthritis    a. knees, hips, hands;  b. 11/2013 s/p L TKA @ [facility].  . Bile reflux gastritis   . Bulging lumbar disc   . BXO (balanitis xerotica obliterans)   . 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 Depression   . DVT (deep venous thrombosis) ([place])   . Erosive  esophagitis   . Gross hematuria   . Hyperlipemia   . Hypertension    borderline  . Internal hemorrhoids   . Phimosis   . Pulmonary embolism [facility])     Past Surgical History:  Procedure Laterality Date  . BUNIONECTOMY Bilateral 01/06/2015   Procedure: BUNIONECTOMY;  Surgeon: [name], MD;  Location: [facility] ORS;  Service: Orthopedics;  Laterality: Bilateral;  . CARDIAC CATHETERIZATION  ~ 2005   "once"  . CATARACT EXTRACTION W/ INTRAOCULAR LENS  IMPLANT, BILATERAL Bilateral ~ 2010  . COLONOSCOPY WITH PROPOFOL N/A 12/07/2015   Procedure: COLONOSCOPY WITH PROPOFOL;  Surgeon: Lollie Sails, MD;  Location: [facility] ENDOSCOPY;  Service: Endoscopy;  Laterality: N/A;  . EP IMPLANTABLE DEVICE N/A 02/16/2016   MDT dual chamber (His Bundle) pacemaker implanted by Dr Caryl Comes for intermittent complete heart block  . ESOPHAGOGASTRODUODENOSCOPY (EGD) WITH PROPOFOL N/A 12/07/2015   Procedure: ESOPHAGOGASTRODUODENOSCOPY (EGD) WITH PROPOFOL;  Surgeon: Lollie Sails, MD;  Location: [facility] ENDOSCOPY;  Service: Endoscopy;  Laterality: N/A;  . ESOPHAGOGASTRODUODENOSCOPY (EGD) WITH PROPOFOL N/A 05/17/2017   Procedure: ESOPHAGOGASTRODUODENOSCOPY (EGD) WITH PROPOFOL;  Surgeon: Lollie Sails, MD;  Location[facility] ENDOSCOPY;  Service: Endoscopy;  Laterality: N/A;  . HAMMER TOE SURGERY Bilateral 01/06/2015   Procedure:  HAMMER TOE CORRECTION;  Surgeon: [name], MD;  Location: [facility] ORS;  Service: Orthopedics;  Laterality: Bilateral;  . KNEE CARTILAGE SURGERY Left 1965   "football injury"  . Left Total Knee Arthroplasty     a. 11/2013 [facility].  Marland Kitchen PILONIDAL CYST EXCISION  1970's  . TOTAL HIP ARTHROPLASTY Right 2004  . TOTAL HIP ARTHROPLASTY Left 2006  . UPPER GI ENDOSCOPY      Current Outpatient Medications  Medication Sig Dispense Refill  . acetaminophen (TYLENOL) 500 MG tablet Take 2,000 mg by mouth daily as needed for moderate pain or headache.    Marland Kitchen apixaban (ELIQUIS) 5 MG TABS tablet Take 1 tablet  (5 mg total) by mouth 2 (two) times daily. 180 tablet 4  . ascorbic acid (VITAMIN C) 250 MG tablet Take 250 mg by mouth daily.     Marland Kitchen b complex vitamins capsule Take 1 capsule by mouth daily.    . Cranberry 500 MG CAPS Take 1 capsule by mouth daily.     . Cyanocobalamin (B-12) 1000 MCG CAPS Take 1,000 mcg by mouth daily.     . ferrous sulfate 325 (65 FE) MG tablet Take 325 mg by mouth daily with breakfast.    . flecainide (TAMBOCOR) 100 MG tablet Take 1 tablet (100 mg total) by mouth 2 (two) times daily. 180 tablet 3  . gabapentin (NEURONTIN) 300 MG capsule Take 1 capsule (300 mg total) by mouth 3 (three) times daily. 90 capsule 3  . HYDROcodone-acetaminophen (NORCO/VICODIN) 5-325 MG tablet Take 1 tablet by mouth 2 (two) times daily at 10 AM and 5 PM. 60 tablet 0  . hyoscyamine (LEVSIN, ANASPAZ) 0.125 MG tablet Take 0.125 mg by mouth every 6 (six) hours as needed for bladder spasms or cramping.     Marland Kitchen LORazepam (ATIVAN) 1 MG tablet Take 1 tablet (1 mg total) by mouth at bedtime. 90 tablet 1  . Magnesium 400 MG TABS Take 400 mg by mouth daily.     . Melatonin 5 MG TABS Take 10 mg by mouth at bedtime.    . Multiple Vitamin (MULTI-VITAMINS) TABS Take 1 tablet by mouth daily.     . Omega-3 Fatty Acids (FISH OIL) 1200 MG CAPS Take 1,200 mg by mouth daily.    Marland Kitchen oxymetazoline (AFRIN) 0.05 % nasal spray Place 2 sprays into both nostrils at bedtime as needed for congestion.    . pantoprazole (PROTONIX) 40 MG tablet TAKE 1 TABLET (40 MG TOTAL) BY MOUTH ONCE DAILY TAKE 30 MINS BEFORE MEAL  6  . QUEtiapine Fumarate (SEROQUEL XR) 150 MG 24 hr tablet Take 1 tablet (150 mg total) by mouth at bedtime. 90 tablet 4  . simvastatin (ZOCOR) 20 MG tablet Take 1 tablet (20 mg total) by mouth daily. 90 tablet 4  . solifenacin (VESICARE) 10 MG tablet Take 1 tablet (10 mg total) by mouth daily. 90 tablet 4  . venlafaxine XR (EFFEXOR XR) 75 MG 24 hr capsule Take 3 capsules (225 mg total) by mouth daily. 270 capsule 4   No  current facility-administered medications for this visit.     No Known Allergies    Review of Systems negative except from HPI and PMH  Physical Exam BP 116/78 (BP Location: Left Arm, Patient Position: Sitting, Cuff Size: Large)   Pulse 80   Ht 5\' 11"  (1.803 m)   Wt 287 lb (130.2 kg)   BMI 40.03 kg/m  Well developed and nourished in no acute distress HENT normal Neck supple with  JVP-flat Clear Regular rate and rhythm, no murmurs or gallops Abd-soft with active BS No Clubbing cyanosis edema Skin-warm and dry A & Oriented  Grossly normal sensory and motor function   ECG ECG demonstrates P synchronous pacing with a His bundle lead.  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  Orthostatic intolerance remains an issue.We discussed extensively the physiology of orthstasis and positional stress.  We discussed the role of salt and water repletion, the importance of exercise the awareness of triggers and the role of ambient heat and dehydration and the role of isometric contraction prior to standing.  Psychotropic medications may be attributed.  However, they are not subject to change.  No intercurrent atrial arrhythmias since early March.  On Anticoagulation;  No bleeding issues   We spent more than 50% of our >25 min visit in face to face counseling regarding the above

## 2017-10-18 ENCOUNTER — Telehealth: Payer: Self-pay | Admitting: Family Medicine

## 2017-10-18 NOTE — Telephone Encounter (Signed)
Checked with pharmacy - pt. Has refills on file.

## 2017-10-18 NOTE — Telephone Encounter (Signed)
Copied from [place] [id number]. Topic: Quick Communication - Rx Refill/Question >> Oct 18, 2017  9:15 AM Reyne Dumas L wrote: Medication: gabapentin (NEURONTIN) 300 MG capsule  Has the patient contacted their pharmacy? No - he will call (Agent: If no, request that the patient contact the pharmacy for the refill.) (Agent: If yes, when and what did the pharmacy advise?)  Preferred Pharmacy (with phone number or street name): [id number] - [place], [place] [address] [phone] (Phone) [phone] (Fax)  Agent: Please be advised that RX refills may take up to 3 business days. We ask that you follow-up with your pharmacy.

## 2017-10-30 ENCOUNTER — Encounter: Payer: Self-pay | Admitting: Anesthesiology

## 2017-10-30 ENCOUNTER — Ambulatory Visit: Payer: Medicare Other | Attending: Anesthesiology | Admitting: Anesthesiology

## 2017-10-30 ENCOUNTER — Other Ambulatory Visit: Payer: Self-pay

## 2017-10-30 VITALS — BP 158/80 | HR 94 | Temp 98.9°F | Resp 18 | Ht 71.0 in | Wt 294.0 lb

## 2017-10-30 DIAGNOSIS — M79642 Pain in left hand: Secondary | ICD-10-CM | POA: Diagnosis not present

## 2017-10-30 DIAGNOSIS — M47817 Spondylosis without myelopathy or radiculopathy, lumbosacral region: Secondary | ICD-10-CM | POA: Diagnosis not present

## 2017-10-30 DIAGNOSIS — M4697 Unspecified inflammatory spondylopathy, lumbosacral region: Secondary | ICD-10-CM | POA: Diagnosis not present

## 2017-10-30 DIAGNOSIS — M1611 Unilateral primary osteoarthritis, right hip: Secondary | ICD-10-CM | POA: Diagnosis not present

## 2017-10-30 DIAGNOSIS — Z79899 Other long term (current) drug therapy: Secondary | ICD-10-CM | POA: Insufficient documentation

## 2017-10-30 DIAGNOSIS — M5431 Sciatica, right side: Secondary | ICD-10-CM | POA: Diagnosis not present

## 2017-10-30 DIAGNOSIS — G894 Chronic pain syndrome: Secondary | ICD-10-CM | POA: Insufficient documentation

## 2017-10-30 DIAGNOSIS — Z79891 Long term (current) use of opiate analgesic: Secondary | ICD-10-CM | POA: Insufficient documentation

## 2017-10-30 DIAGNOSIS — F1190 Opioid use, unspecified, uncomplicated: Secondary | ICD-10-CM

## 2017-10-30 DIAGNOSIS — M79641 Pain in right hand: Secondary | ICD-10-CM

## 2017-10-30 DIAGNOSIS — M5432 Sciatica, left side: Secondary | ICD-10-CM

## 2017-10-30 MED ORDER — HYDROCODONE-ACETAMINOPHEN 5-325 MG PO TABS: 1.0000 | ORAL_TABLET | Freq: Two times a day (BID) | ORAL | 0 refills | Status: DC

## 2017-10-30 NOTE — Progress Notes (Signed)
Nursing Pain Medication Assessment:  Safety precautions to be maintained throughout the outpatient stay will include: orient to surroundings, keep bed in low position, maintain call bell within reach at all times, provide assistance with transfer out of bed and ambulation.  Medication Inspection Compliance: Pill count conducted under aseptic conditions, in front of the patient. Neither the pills nor the bottle was removed from the patient's sight at any time. Once count was completed pills were immediately returned to the patient in their original bottle.  Medication: Hydrocodone/APAP Pill/Patch Count: 0 of 60 pills remain Pill/Patch Appearance: Markings consistent with prescribed medication Bottle Appearance: Standard pharmacy container. Clearly labeled. Filled Date: 09 / 06 / 2019 Last Medication intake:  Yesterday

## 2017-10-30 NOTE — Patient Instructions (Signed)
You were given 2 prescriptions for Hydrocodone today. 

## 2017-10-30 NOTE — Progress Notes (Signed)
Subjective:  Patient ID: Nicholas [name], male    DOB: 1946-01-01  Age: 72 y.o. MRN: [id number]  CC: Hand Pain (bilateral) and Hip Pain (right)   Procedure: None  HPI Nicholas [name] presents for evaluation.  He was last seen approximately 2-1/2 months ago and has been doing well with his current medication regimen.  The quality characteristic and distribution of his low back pain and diffuse body pain have been stable in nature.  No new changes in strength or bowel or bladder function are noted.  I have reviewed his narcotic assessment sheet and based on his findings he is continuing to derive good functional lifestyle improvement with his medications.  No untoward side effects are noted.  Otherwise she is been in his usual state of health.  He has responded favorably in the past 2 epidural steroid injections.  Outpatient Medications Prior to Visit  Medication Sig Dispense Refill  . acetaminophen (TYLENOL) 500 MG tablet Take 2,000 mg by mouth daily as needed for moderate pain or headache.    Marland Kitchen apixaban (ELIQUIS) 5 MG TABS tablet Take 1 tablet (5 mg total) by mouth 2 (two) times daily. 180 tablet 4  . ascorbic acid (VITAMIN C) 250 MG tablet Take 250 mg by mouth daily.     Marland Kitchen b complex vitamins capsule Take 1 capsule by mouth daily.    . Cranberry 500 MG CAPS Take 1 capsule by mouth daily.     . Cyanocobalamin (B-12) 1000 MCG CAPS Take 1,000 mcg by mouth daily.     . ferrous sulfate 325 (65 FE) MG tablet Take 325 mg by mouth daily with breakfast.    . flecainide (TAMBOCOR) 100 MG tablet Take 1 tablet (100 mg total) by mouth 2 (two) times daily. 180 tablet 3  . gabapentin (NEURONTIN) 300 MG capsule Take 1 capsule (300 mg total) by mouth 3 (three) times daily. 90 capsule 3  . hyoscyamine (LEVSIN, ANASPAZ) 0.125 MG tablet Take 0.125 mg by mouth every 6 (six) hours as needed for bladder spasms or cramping.     Marland Kitchen LORazepam (ATIVAN) 1 MG tablet Take 1 tablet (1 mg total) by mouth at bedtime. 90 tablet 1   . Magnesium 400 MG TABS Take 400 mg by mouth daily.     . Melatonin 5 MG TABS Take 10 mg by mouth at bedtime.    . Multiple Vitamin (MULTI-VITAMINS) TABS Take 1 tablet by mouth daily.     . Omega-3 Fatty Acids (FISH OIL) 1200 MG CAPS Take 1,200 mg by mouth daily.    Marland Kitchen oxymetazoline (AFRIN) 0.05 % nasal spray Place 2 sprays into both nostrils at bedtime as needed for congestion.    . pantoprazole (PROTONIX) 40 MG tablet TAKE 1 TABLET (40 MG TOTAL) BY MOUTH ONCE DAILY TAKE 30 MINS BEFORE MEAL  6  . QUEtiapine Fumarate (SEROQUEL XR) 150 MG 24 hr tablet Take 1 tablet (150 mg total) by mouth at bedtime. 90 tablet 4  . simvastatin (ZOCOR) 20 MG tablet Take 1 tablet (20 mg total) by mouth daily. 90 tablet 4  . solifenacin (VESICARE) 10 MG tablet Take 1 tablet (10 mg total) by mouth daily. 90 tablet 4  . venlafaxine XR (EFFEXOR XR) 75 MG 24 hr capsule Take 3 capsules (225 mg total) by mouth daily. 270 capsule 4  . HYDROcodone-acetaminophen (NORCO/VICODIN) 5-325 MG tablet Take 1 tablet by mouth 2 (two) times daily at 10 AM and 5 PM. 60 tablet 0   No  facility-administered medications prior to visit.     Review of Systems CNS: No confusion or sedation Cardiac: No angina or palpitations GI: No abdominal pain or constipation Constitutional: No nausea vomiting fevers or chills  Objective:  BP (!) 158/80   Pulse 94   Temp 98.9 F (37.2 C) (Oral)   Resp 18   Ht 5\' 11"  (1.803 m)   Wt 294 lb (133.4 kg)   SpO2 97%   BMI 41.00 kg/m    BP Readings from Last 3 Encounters:  10/30/17 (!) 158/80  10/16/17 116/78  08/21/17 (!) 155/83     Wt Readings from Last 3 Encounters:  10/30/17 294 lb (133.4 kg)  10/16/17 287 lb (130.2 kg)  08/21/17 297 lb (134.7 kg)     Physical Exam Pt is alert and oriented PERRL EOMI HEART IS RRR no murmur or rub LCTA no wheezing or rales MUSCULOSKELETAL reveals some mild lumbar paraspinous muscle tenderness but no overt trigger points.  His lower extremity  strength and function are at baseline and he is walking with a mildly antalgic gait.  Labs  No results found for: HGBA1C Lab Results  Component Value Date   LDLCALC 95 07/02/2017   CREATININE 1.08 07/02/2017    -------------------------------------------------------------------------------------------------------------------- Lab Results  Component Value Date   WBC 5.9 07/02/2017   HGB 14.6 07/02/2017   HCT 44.2 07/02/2017   PLT 181 07/02/2017   GLUCOSE 124 (H) 07/02/2017   CHOL 170 07/02/2017   TRIG 78 07/02/2017   HDL 59 07/02/2017   LDLCALC 95 07/02/2017   ALT 18 07/02/2017   AST 27 07/02/2017   NA 141 07/02/2017   K 4.8 07/02/2017   CL 101 07/02/2017   CREATININE 1.08 07/02/2017   BUN 22 07/02/2017   CO2 23 07/02/2017   TSH 1.260 07/02/2017   INR 1.2 02/16/2014    --------------------------------------------------------------------------------------------------------------------- Ct Head Wo Contrast  Result Date: 06/11/2017 CLINICAL DATA:  Fall, dizziness, posterior scalp laceration, on Eliquis EXAM: CT HEAD WITHOUT CONTRAST CT CERVICAL SPINE WITHOUT CONTRAST TECHNIQUE: Multidetector CT imaging of the head and cervical spine was performed following the standard protocol without intravenous contrast. Multiplanar CT image reconstructions of the cervical spine were also generated. COMPARISON:  None. FINDINGS: CT HEAD FINDINGS Brain: No evidence of acute infarction, hemorrhage, hydrocephalus, extra-axial collection or mass lesion/mass effect. Subcortical white matter and periventricular small vessel ischemic changes. Vascular: Intracranial atherosclerosis. Skull: Normal. Negative for fracture or focal lesion. Sinuses/Orbits: The visualized paranasal sinuses are essentially clear. The mastoid air cells are unopacified. Other: Soft tissue laceration overlying the right parietal scalp (series 4/image 67). CT CERVICAL SPINE FINDINGS Alignment: Mild straightening of the cervical  spine, likely positional. Skull base and vertebrae: No acute fracture. No primary bone lesion or focal pathologic process. Soft tissues and spinal canal: No prevertebral fluid or swelling. No visible canal hematoma. Disc levels: Mild degenerative changes of the mid cervical spine. Spinal canal is patent. Upper chest: Visualized lung apices are clear. Other: Visualized thyroid is unremarkable. IMPRESSION: Soft tissue laceration overlying the right parietal scalp. No evidence of calvarial fracture. No evidence of acute intracranial abnormality. Small vessel ischemic changes. No evidence of traumatic injury to the cervical spine. Mild degenerative changes. Electronically Signed   By: Julian Hy M.D.   On: 06/11/2017 15:46   Ct Cervical Spine Wo Contrast  Result Date: 06/11/2017 CLINICAL DATA:  Fall, dizziness, posterior scalp laceration, on Eliquis EXAM: CT HEAD WITHOUT CONTRAST CT CERVICAL SPINE WITHOUT CONTRAST TECHNIQUE: Multidetector CT imaging of  the head and cervical spine was performed following the standard protocol without intravenous contrast. Multiplanar CT image reconstructions of the cervical spine were also generated. COMPARISON:  None. FINDINGS: CT HEAD FINDINGS Brain: No evidence of acute infarction, hemorrhage, hydrocephalus, extra-axial collection or mass lesion/mass effect. Subcortical white matter and periventricular small vessel ischemic changes. Vascular: Intracranial atherosclerosis. Skull: Normal. Negative for fracture or focal lesion. Sinuses/Orbits: The visualized paranasal sinuses are essentially clear. The mastoid air cells are unopacified. Other: Soft tissue laceration overlying the right parietal scalp (series 4/image 67). CT CERVICAL SPINE FINDINGS Alignment: Mild straightening of the cervical spine, likely positional. Skull base and vertebrae: No acute fracture. No primary bone lesion or focal pathologic process. Soft tissues and spinal canal: No prevertebral fluid or  swelling. No visible canal hematoma. Disc levels: Mild degenerative changes of the mid cervical spine. Spinal canal is patent. Upper chest: Visualized lung apices are clear. Other: Visualized thyroid is unremarkable. IMPRESSION: Soft tissue laceration overlying the right parietal scalp. No evidence of calvarial fracture. No evidence of acute intracranial abnormality. Small vessel ischemic changes. No evidence of traumatic injury to the cervical spine. Mild degenerative changes. Electronically Signed   By: Julian Hy M.D.   On: 06/11/2017 15:46     Assessment & Plan:   Kenyata was seen today for hand pain and hip pain.  Diagnoses and all orders for this visit:  Chronic pain syndrome  Bilateral sciatica  Osteoarthritis of right hip, unspecified osteoarthritis type  Facet arthritis of lumbosacral region  Opiate use  Bilateral hand pain  Other orders -     Discontinue: HYDROcodone-acetaminophen (NORCO/VICODIN) 5-325 MG tablet; Take 1 tablet by mouth 2 (two) times daily at 10 AM and 5 PM. -     HYDROcodone-acetaminophen (NORCO/VICODIN) 5-325 MG tablet; Take 1 tablet by mouth 2 (two) times daily at 10 AM and 5 PM.        ----------------------------------------------------------------------------------------------------------------------  Problem List Items Addressed This Visit    None    Visit Diagnoses    Chronic pain syndrome    -  Primary   Bilateral sciatica       Osteoarthritis of right hip, unspecified osteoarthritis type       Relevant Medications   HYDROcodone-acetaminophen (NORCO/VICODIN) 5-325 MG tablet   Facet arthritis of lumbosacral region       Relevant Medications   HYDROcodone-acetaminophen (NORCO/VICODIN) 5-325 MG tablet   Opiate use       Bilateral hand pain            ----------------------------------------------------------------------------------------------------------------------  1. Chronic pain syndrome We will continue on his basic  regimen at this point.  Refills will be given for October 8 and November 7.  We will have him return to clinic in 2 months for reevaluation and he has been instructed to contact us if he should be in need of a repeat epidural injection.  He is responded well to this getting approximately 75 to 80% relief lasting several months.  2. Bilateral sciatica Continue core stretching strengthening exercise and efforts at weight loss  3. Osteoarthritis of right hip, unspecified osteoarthritis type As above  4. Facet arthritis of lumbosacral region As above  5. Opiate use We have reviewed the [facility] practitioner database information and it is appropriate.  6. Bilateral hand pain As above    ----------------------------------------------------------------------------------------------------------------------  I am having Nicholas [name] "Nicholas [name]" maintain his oxymetazoline, B-12, ferrous sulfate, hyoscyamine, Fish Oil, acetaminophen, ascorbic acid, Cranberry, MULTI-VITAMINS, Magnesium, flecainide, pantoprazole, b complex vitamins,  Melatonin, venlafaxine XR, solifenacin, simvastatin, QUEtiapine Fumarate, LORazepam, apixaban, gabapentin, and HYDROcodone-acetaminophen.   Meds ordered this encounter  Medications  . DISCONTD: HYDROcodone-acetaminophen (NORCO/VICODIN) 5-325 MG tablet    Sig: Take 1 tablet by mouth 2 (two) times daily at 10 AM and 5 PM.    Dispense:  60 tablet    Refill:  0    Do not fill until [id number]  . HYDROcodone-acetaminophen (NORCO/VICODIN) 5-325 MG tablet    Sig: Take 1 tablet by mouth 2 (two) times daily at 10 AM and 5 PM.    Dispense:  60 tablet    Refill:  0    Do not fill until [id number]   Patient's Medications  New Prescriptions   No medications on file  Previous Medications   ACETAMINOPHEN (TYLENOL) 500 MG TABLET    Take 2,000 mg by mouth daily as needed for moderate pain or headache.   APIXABAN (ELIQUIS) 5 MG TABS TABLET    Take 1 tablet (5 mg total) by  mouth 2 (two) times daily.   ASCORBIC ACID (VITAMIN C) 250 MG TABLET    Take 250 mg by mouth daily.    B COMPLEX VITAMINS CAPSULE    Take 1 capsule by mouth daily.   CRANBERRY 500 MG CAPS    Take 1 capsule by mouth daily.    CYANOCOBALAMIN (B-12) 1000 MCG CAPS    Take 1,000 mcg by mouth daily.    FERROUS SULFATE 325 (65 FE) MG TABLET    Take 325 mg by mouth daily with breakfast.   FLECAINIDE (TAMBOCOR) 100 MG TABLET    Take 1 tablet (100 mg total) by mouth 2 (two) times daily.   GABAPENTIN (NEURONTIN) 300 MG CAPSULE    Take 1 capsule (300 mg total) by mouth 3 (three) times daily.   HYOSCYAMINE (LEVSIN, ANASPAZ) 0.125 MG TABLET    Take 0.125 mg by mouth every 6 (six) hours as needed for bladder spasms or cramping.    LORAZEPAM (ATIVAN) 1 MG TABLET    Take 1 tablet (1 mg total) by mouth at bedtime.   MAGNESIUM 400 MG TABS    Take 400 mg by mouth daily.    MELATONIN 5 MG TABS    Take 10 mg by mouth at bedtime.   MULTIPLE VITAMIN (MULTI-VITAMINS) TABS    Take 1 tablet by mouth daily.    OMEGA-3 FATTY ACIDS (FISH OIL) 1200 MG CAPS    Take 1,200 mg by mouth daily.   OXYMETAZOLINE (AFRIN) 0.05 % NASAL SPRAY    Place 2 sprays into both nostrils at bedtime as needed for congestion.   PANTOPRAZOLE (PROTONIX) 40 MG TABLET    TAKE 1 TABLET (40 MG TOTAL) BY MOUTH ONCE DAILY TAKE 30 MINS BEFORE MEAL   QUETIAPINE FUMARATE (SEROQUEL XR) 150 MG 24 HR TABLET    Take 1 tablet (150 mg total) by mouth at bedtime.   SIMVASTATIN (ZOCOR) 20 MG TABLET    Take 1 tablet (20 mg total) by mouth daily.   SOLIFENACIN (VESICARE) 10 MG TABLET    Take 1 tablet (10 mg total) by mouth daily.   VENLAFAXINE XR (EFFEXOR XR) 75 MG 24 HR CAPSULE    Take 3 capsules (225 mg total) by mouth daily.  Modified Medications   Modified Medication Previous Medication   HYDROCODONE-ACETAMINOPHEN (NORCO/VICODIN) 5-325 MG TABLET HYDROcodone-acetaminophen (NORCO/VICODIN) 5-325 MG tablet      Take 1 tablet by mouth 2 (two) times daily at 10 AM and  5 PM.  Take 1 tablet by mouth 2 (two) times daily at 10 AM and 5 PM.  Discontinued Medications   No medications on file   ----------------------------------------------------------------------------------------------------------------------  Follow-up: Return in about 2 months (around 12/30/2017) for evaluation, med refill.    [name], MD

## 2017-11-15 ENCOUNTER — Other Ambulatory Visit: Payer: Self-pay | Admitting: Family Medicine

## 2017-11-15 NOTE — Telephone Encounter (Signed)
Refill request from pharmacy for gabapentin.  TN called pt to inform pt refills were available. Pt stated he would like Dr. Jeananne Rama to know he is now taking 300mg  at [facility] only. States this was discussed at [place].

## 2017-12-04 DIAGNOSIS — Z961 Presence of intraocular lens: Secondary | ICD-10-CM | POA: Diagnosis not present

## 2017-12-04 DIAGNOSIS — H26493 Other secondary cataract, bilateral: Secondary | ICD-10-CM | POA: Diagnosis not present

## 2017-12-04 DIAGNOSIS — H353131 Nonexudative age-related macular degeneration, bilateral, early dry stage: Secondary | ICD-10-CM | POA: Diagnosis not present

## 2017-12-12 ENCOUNTER — Telehealth: Payer: Self-pay

## 2017-12-12 ENCOUNTER — Ambulatory Visit (INDEPENDENT_AMBULATORY_CARE_PROVIDER_SITE_OTHER): Payer: Medicare Other

## 2017-12-12 DIAGNOSIS — I442 Atrioventricular block, complete: Secondary | ICD-10-CM

## 2017-12-12 NOTE — Telephone Encounter (Signed)
LMOVM reminding pt to send remote transmission.   

## 2017-12-13 NOTE — Progress Notes (Signed)
Remote pacemaker transmission.   

## 2017-12-24 ENCOUNTER — Ambulatory Visit: Payer: Medicare Other | Attending: Anesthesiology | Admitting: Anesthesiology

## 2017-12-24 ENCOUNTER — Other Ambulatory Visit: Payer: Self-pay

## 2017-12-24 ENCOUNTER — Encounter: Payer: Self-pay | Admitting: Anesthesiology

## 2017-12-24 VITALS — BP 158/93 | HR 85 | Temp 98.2°F | Resp 18 | Ht 71.0 in | Wt 294.0 lb

## 2017-12-24 DIAGNOSIS — M79642 Pain in left hand: Secondary | ICD-10-CM | POA: Diagnosis not present

## 2017-12-24 DIAGNOSIS — M170 Bilateral primary osteoarthritis of knee: Secondary | ICD-10-CM | POA: Diagnosis not present

## 2017-12-24 DIAGNOSIS — M5431 Sciatica, right side: Secondary | ICD-10-CM

## 2017-12-24 DIAGNOSIS — M5432 Sciatica, left side: Secondary | ICD-10-CM | POA: Insufficient documentation

## 2017-12-24 DIAGNOSIS — Z79899 Other long term (current) drug therapy: Secondary | ICD-10-CM | POA: Diagnosis not present

## 2017-12-24 DIAGNOSIS — M79641 Pain in right hand: Secondary | ICD-10-CM | POA: Insufficient documentation

## 2017-12-24 DIAGNOSIS — G894 Chronic pain syndrome: Secondary | ICD-10-CM | POA: Insufficient documentation

## 2017-12-24 DIAGNOSIS — M1611 Unilateral primary osteoarthritis, right hip: Secondary | ICD-10-CM | POA: Diagnosis not present

## 2017-12-24 DIAGNOSIS — Z79891 Long term (current) use of opiate analgesic: Secondary | ICD-10-CM | POA: Insufficient documentation

## 2017-12-24 DIAGNOSIS — F1190 Opioid use, unspecified, uncomplicated: Secondary | ICD-10-CM

## 2017-12-24 DIAGNOSIS — M47817 Spondylosis without myelopathy or radiculopathy, lumbosacral region: Secondary | ICD-10-CM | POA: Insufficient documentation

## 2017-12-24 MED ORDER — HYDROCODONE-ACETAMINOPHEN 5-325 MG PO TABS: 1.0000 | ORAL_TABLET | Freq: Two times a day (BID) | ORAL | 0 refills | Status: DC

## 2017-12-24 NOTE — Progress Notes (Signed)
Nursing Pain Medication Assessment:  Safety precautions to be maintained throughout the outpatient stay will include: orient to surroundings, keep bed in low position, maintain call bell within reach at all times, provide assistance with transfer out of bed and ambulation.  Medication Inspection Compliance: Pill count conducted under aseptic conditions, in front of the patient. Neither the pills nor the bottle was removed from the patient's sight at any time. Once count was completed pills were immediately returned to the patient in their original bottle.  Medication: Hydrocodone/APAP Pill/Patch Count: 14 of 60 pills remain Pill/Patch Appearance: Markings consistent with prescribed medication Bottle Appearance: Standard pharmacy container. Clearly labeled. Filled Date: 11/11 / 2019 Last Medication intake:  Today

## 2017-12-24 NOTE — Patient Instructions (Signed)
2 prescriptions for Hydrocodone-acteamin. given and will last until 02/28/2018.

## 2017-12-24 NOTE — Progress Notes (Signed)
Subjective:  Patient ID: Nicholas [name], male    DOB: Sep 27, 1945  Age: 72 y.o. MRN: [id number]  CC: Hand Pain (bilateral)   Procedure: None  HPI Nicholas [name] presents for reevaluation.  He was last seen few months ago and is doing well with his current regiment.  No change in the quality characteristic distribution of his low back are noted.  His leg pain is been at baseline.  He is tolerating his medications well and based on his narcotic assessment she is continuing to derive good functional lifestyle improvement with the medications.  Otherwise he seems to be doing well.  Outpatient Medications Prior to Visit  Medication Sig Dispense Refill  . acetaminophen (TYLENOL) 500 MG tablet Take 2,000 mg by mouth daily as needed for moderate pain or headache.    Marland Kitchen apixaban (ELIQUIS) 5 MG TABS tablet Take 1 tablet (5 mg total) by mouth 2 (two) times daily. 180 tablet 4  . ascorbic acid (VITAMIN C) 250 MG tablet Take 250 mg by mouth daily.     Marland Kitchen b complex vitamins capsule Take 1 capsule by mouth daily.    . Cranberry 500 MG CAPS Take 1 capsule by mouth daily.     . Cyanocobalamin (B-12) 1000 MCG CAPS Take 1,000 mcg by mouth daily.     . ferrous sulfate 325 (65 FE) MG tablet Take 325 mg by mouth daily with breakfast.    . flecainide (TAMBOCOR) 100 MG tablet Take 1 tablet (100 mg total) by mouth 2 (two) times daily. 180 tablet 3  . gabapentin (NEURONTIN) 300 MG capsule Take 1 capsule (300 mg total) by mouth 3 (three) times daily. 90 capsule 3  . hyoscyamine (LEVSIN, ANASPAZ) 0.125 MG tablet Take 0.125 mg by mouth every 6 (six) hours as needed for bladder spasms or cramping.     Marland Kitchen LORazepam (ATIVAN) 1 MG tablet Take 1 tablet (1 mg total) by mouth at bedtime. 90 tablet 1  . Magnesium 400 MG TABS Take 400 mg by mouth daily.     . Melatonin 5 MG TABS Take 10 mg by mouth at bedtime.    . Multiple Vitamin (MULTI-VITAMINS) TABS Take 1 tablet by mouth daily.     . Omega-3 Fatty Acids (FISH OIL) 1200 MG CAPS  Take 1,200 mg by mouth daily.    Marland Kitchen oxymetazoline (AFRIN) 0.05 % nasal spray Place 2 sprays into both nostrils at bedtime as needed for congestion.    . pantoprazole (PROTONIX) 40 MG tablet TAKE 1 TABLET (40 MG TOTAL) BY MOUTH ONCE DAILY TAKE 30 MINS BEFORE MEAL  6  . QUEtiapine Fumarate (SEROQUEL XR) 150 MG 24 hr tablet Take 1 tablet (150 mg total) by mouth at bedtime. 90 tablet 4  . simvastatin (ZOCOR) 20 MG tablet Take 1 tablet (20 mg total) by mouth daily. 90 tablet 4  . solifenacin (VESICARE) 10 MG tablet Take 1 tablet (10 mg total) by mouth daily. 90 tablet 4  . venlafaxine XR (EFFEXOR XR) 75 MG 24 hr capsule Take 3 capsules (225 mg total) by mouth daily. 270 capsule 4  . HYDROcodone-acetaminophen (NORCO/VICODIN) 5-325 MG tablet Take 1 tablet by mouth 2 (two) times daily at 10 AM and 5 PM. 60 tablet 0   No facility-administered medications prior to visit.     Review of Systems CNS: No confusion or sedation Cardiac: No angina or palpitations GI: No abdominal pain or constipation Constitutional: No nausea vomiting fevers or chills  Objective:  BP Marland Kitchen)  158/93   Pulse 85   Temp 98.2 F (36.8 C) (Oral)   Resp 18   Ht 5\' 11"  (1.803 m)   Wt 294 lb (133.4 kg)   SpO2 99%   BMI 41.00 kg/m    BP Readings from Last 3 Encounters:  12/24/17 (!) 158/93  10/30/17 (!) 158/80  10/16/17 116/78     Wt Readings from Last 3 Encounters:  12/24/17 294 lb (133.4 kg)  10/30/17 294 lb (133.4 kg)  10/16/17 287 lb (130.2 kg)     Physical Exam Pt is alert and oriented PERRL EOMI HEART IS RRR no murmur or rub LCTA no wheezing or rales MUSCULOSKELETAL reveals some paraspinous muscle tenderness but no overt trigger points.  He walks with a mildly antalgic gait.  His muscle tone and bulk to the lower extremities appears at baseline.  Labs  No results found for: HGBA1C Lab Results  Component Value Date   LDLCALC 95 07/02/2017   CREATININE 1.08 07/02/2017     -------------------------------------------------------------------------------------------------------------------- Lab Results  Component Value Date   WBC 5.9 07/02/2017   HGB 14.6 07/02/2017   HCT 44.2 07/02/2017   PLT 181 07/02/2017   GLUCOSE 124 (H) 07/02/2017   CHOL 170 07/02/2017   TRIG 78 07/02/2017   HDL 59 07/02/2017   LDLCALC 95 07/02/2017   ALT 18 07/02/2017   AST 27 07/02/2017   NA 141 07/02/2017   K 4.8 07/02/2017   CL 101 07/02/2017   CREATININE 1.08 07/02/2017   BUN 22 07/02/2017   CO2 23 07/02/2017   TSH 1.260 07/02/2017   INR 1.2 02/16/2014    --------------------------------------------------------------------------------------------------------------------- Ct Head Wo Contrast  Result Date: 06/11/2017 CLINICAL DATA:  Fall, dizziness, posterior scalp laceration, on Eliquis EXAM: CT HEAD WITHOUT CONTRAST CT CERVICAL SPINE WITHOUT CONTRAST TECHNIQUE: Multidetector CT imaging of the head and cervical spine was performed following the standard protocol without intravenous contrast. Multiplanar CT image reconstructions of the cervical spine were also generated. COMPARISON:  None. FINDINGS: CT HEAD FINDINGS Brain: No evidence of acute infarction, hemorrhage, hydrocephalus, extra-axial collection or mass lesion/mass effect. Subcortical white matter and periventricular small vessel ischemic changes. Vascular: Intracranial atherosclerosis. Skull: Normal. Negative for fracture or focal lesion. Sinuses/Orbits: The visualized paranasal sinuses are essentially clear. The mastoid air cells are unopacified. Other: Soft tissue laceration overlying the right parietal scalp (series 4/image 67). CT CERVICAL SPINE FINDINGS Alignment: Mild straightening of the cervical spine, likely positional. Skull base and vertebrae: No acute fracture. No primary bone lesion or focal pathologic process. Soft tissues and spinal canal: No prevertebral fluid or swelling. No visible canal hematoma. Disc  levels: Mild degenerative changes of the mid cervical spine. Spinal canal is patent. Upper chest: Visualized lung apices are clear. Other: Visualized thyroid is unremarkable. IMPRESSION: Soft tissue laceration overlying the right parietal scalp. No evidence of calvarial fracture. No evidence of acute intracranial abnormality. Small vessel ischemic changes. No evidence of traumatic injury to the cervical spine. Mild degenerative changes. Electronically Signed   By: Julian Hy M.D.   On: 06/11/2017 15:46   Ct Cervical Spine Wo Contrast  Result Date: 06/11/2017 CLINICAL DATA:  Fall, dizziness, posterior scalp laceration, on Eliquis EXAM: CT HEAD WITHOUT CONTRAST CT CERVICAL SPINE WITHOUT CONTRAST TECHNIQUE: Multidetector CT imaging of the head and cervical spine was performed following the standard protocol without intravenous contrast. Multiplanar CT image reconstructions of the cervical spine were also generated. COMPARISON:  None. FINDINGS: CT HEAD FINDINGS Brain: No evidence of acute infarction, hemorrhage, hydrocephalus,  extra-axial collection or mass lesion/mass effect. Subcortical white matter and periventricular small vessel ischemic changes. Vascular: Intracranial atherosclerosis. Skull: Normal. Negative for fracture or focal lesion. Sinuses/Orbits: The visualized paranasal sinuses are essentially clear. The mastoid air cells are unopacified. Other: Soft tissue laceration overlying the right parietal scalp (series 4/image 67). CT CERVICAL SPINE FINDINGS Alignment: Mild straightening of the cervical spine, likely positional. Skull base and vertebrae: No acute fracture. No primary bone lesion or focal pathologic process. Soft tissues and spinal canal: No prevertebral fluid or swelling. No visible canal hematoma. Disc levels: Mild degenerative changes of the mid cervical spine. Spinal canal is patent. Upper chest: Visualized lung apices are clear. Other: Visualized thyroid is unremarkable. IMPRESSION:  Soft tissue laceration overlying the right parietal scalp. No evidence of calvarial fracture. No evidence of acute intracranial abnormality. Small vessel ischemic changes. No evidence of traumatic injury to the cervical spine. Mild degenerative changes. Electronically Signed   By: Julian Hy M.D.   On: 06/11/2017 15:46     Assessment & Plan:   Shayaan was seen today for hand pain.  Diagnoses and all orders for this visit:  Chronic pain syndrome  Bilateral sciatica  Osteoarthritis of right hip, unspecified osteoarthritis type  Facet arthritis of lumbosacral region  Bilateral hand pain  Chronic, continuous use of opioids  Primary osteoarthritis of both knees  Other orders -     Discontinue: HYDROcodone-acetaminophen (NORCO/VICODIN) 5-325 MG tablet; Take 1 tablet by mouth 2 (two) times daily at 10 AM and 5 PM. -     HYDROcodone-acetaminophen (NORCO/VICODIN) 5-325 MG tablet; Take 1 tablet by mouth 2 (two) times daily at 10 AM and 5 PM.        ----------------------------------------------------------------------------------------------------------------------  Problem List Items Addressed This Visit    None    Visit Diagnoses    Chronic pain syndrome    -  Primary   Bilateral sciatica       Osteoarthritis of right hip, unspecified osteoarthritis type       Relevant Medications   HYDROcodone-acetaminophen (NORCO/VICODIN) 5-325 MG tablet   Facet arthritis of lumbosacral region       Relevant Medications   HYDROcodone-acetaminophen (NORCO/VICODIN) 5-325 MG tablet   Bilateral hand pain       Chronic, continuous use of opioids       Primary osteoarthritis of both knees            ----------------------------------------------------------------------------------------------------------------------  1. Chronic pain syndrome We will continue with his current medication regimen with refills given for December 7 and January 6.  We have reviewed the [facility]  practitioner database information and his medications are appropriate.  We will schedule him for return in 2 months.  2. Bilateral sciatica We will defer on any injections at this time and have encouraged him to continue with core stretching strengthening exercises.  3. Osteoarthritis of right hip, unspecified osteoarthritis type As above  4. Facet arthritis of lumbosacral region   5. Bilateral hand pain He is scheduled to see his primary care physician and we talked about possible referral to a rheumatologist to rule out rheumatologic disease.  6. Chronic, continuous use of opioids As above  7. Primary osteoarthritis of both knees     ----------------------------------------------------------------------------------------------------------------------  I am having Nicholas [name] "Tommy" maintain his oxymetazoline, B-12, ferrous sulfate, hyoscyamine, Fish Oil, acetaminophen, ascorbic acid, Cranberry, MULTI-VITAMINS, Magnesium, flecainide, pantoprazole, b complex vitamins, Melatonin, venlafaxine XR, solifenacin, simvastatin, QUEtiapine Fumarate, LORazepam, apixaban, gabapentin, and HYDROcodone-acetaminophen.   Meds ordered this  encounter  Medications  . DISCONTD: HYDROcodone-acetaminophen (NORCO/VICODIN) 5-325 MG tablet    Sig: Take 1 tablet by mouth 2 (two) times daily at 10 AM and 5 PM.    Dispense:  60 tablet    Refill:  0    Do not fill until [id number]  . HYDROcodone-acetaminophen (NORCO/VICODIN) 5-325 MG tablet    Sig: Take 1 tablet by mouth 2 (two) times daily at 10 AM and 5 PM.    Dispense:  60 tablet    Refill:  0    Do not fill until [id number]   Patient's Medications  New Prescriptions   No medications on file  Previous Medications   ACETAMINOPHEN (TYLENOL) 500 MG TABLET    Take 2,000 mg by mouth daily as needed for moderate pain or headache.   APIXABAN (ELIQUIS) 5 MG TABS TABLET    Take 1 tablet (5 mg total) by mouth 2 (two) times daily.   ASCORBIC ACID (VITAMIN  C) 250 MG TABLET    Take 250 mg by mouth daily.    B COMPLEX VITAMINS CAPSULE    Take 1 capsule by mouth daily.   CRANBERRY 500 MG CAPS    Take 1 capsule by mouth daily.    CYANOCOBALAMIN (B-12) 1000 MCG CAPS    Take 1,000 mcg by mouth daily.    FERROUS SULFATE 325 (65 FE) MG TABLET    Take 325 mg by mouth daily with breakfast.   FLECAINIDE (TAMBOCOR) 100 MG TABLET    Take 1 tablet (100 mg total) by mouth 2 (two) times daily.   GABAPENTIN (NEURONTIN) 300 MG CAPSULE    Take 1 capsule (300 mg total) by mouth 3 (three) times daily.   HYOSCYAMINE (LEVSIN, ANASPAZ) 0.125 MG TABLET    Take 0.125 mg by mouth every 6 (six) hours as needed for bladder spasms or cramping.    LORAZEPAM (ATIVAN) 1 MG TABLET    Take 1 tablet (1 mg total) by mouth at bedtime.   MAGNESIUM 400 MG TABS    Take 400 mg by mouth daily.    MELATONIN 5 MG TABS    Take 10 mg by mouth at bedtime.   MULTIPLE VITAMIN (MULTI-VITAMINS) TABS    Take 1 tablet by mouth daily.    OMEGA-3 FATTY ACIDS (FISH OIL) 1200 MG CAPS    Take 1,200 mg by mouth daily.   OXYMETAZOLINE (AFRIN) 0.05 % NASAL SPRAY    Place 2 sprays into both nostrils at bedtime as needed for congestion.   PANTOPRAZOLE (PROTONIX) 40 MG TABLET    TAKE 1 TABLET (40 MG TOTAL) BY MOUTH ONCE DAILY TAKE 30 MINS BEFORE MEAL   QUETIAPINE FUMARATE (SEROQUEL XR) 150 MG 24 HR TABLET    Take 1 tablet (150 mg total) by mouth at bedtime.   SIMVASTATIN (ZOCOR) 20 MG TABLET    Take 1 tablet (20 mg total) by mouth daily.   SOLIFENACIN (VESICARE) 10 MG TABLET    Take 1 tablet (10 mg total) by mouth daily.   VENLAFAXINE XR (EFFEXOR XR) 75 MG 24 HR CAPSULE    Take 3 capsules (225 mg total) by mouth daily.  Modified Medications   Modified Medication Previous Medication   HYDROCODONE-ACETAMINOPHEN (NORCO/VICODIN) 5-325 MG TABLET HYDROcodone-acetaminophen (NORCO/VICODIN) 5-325 MG tablet      Take 1 tablet by mouth 2 (two) times daily at 10 AM and 5 PM.    Take 1 tablet by mouth 2 (two) times daily  at 10 AM and 5 PM.  Discontinued Medications   No medications on file   ----------------------------------------------------------------------------------------------------------------------  Follow-up: Return in about 2 months (around 02/24/2018) for evaluation, med refill.    [name], MD

## 2018-01-03 ENCOUNTER — Encounter: Payer: Self-pay | Admitting: Family Medicine

## 2018-01-03 ENCOUNTER — Ambulatory Visit (INDEPENDENT_AMBULATORY_CARE_PROVIDER_SITE_OTHER): Payer: Medicare Other | Admitting: Family Medicine

## 2018-01-03 VITALS — BP 116/67 | HR 84 | Temp 97.8°F | Ht 71.0 in | Wt 285.0 lb

## 2018-01-03 DIAGNOSIS — F419 Anxiety disorder, unspecified: Secondary | ICD-10-CM

## 2018-01-03 DIAGNOSIS — I951 Orthostatic hypotension: Secondary | ICD-10-CM | POA: Diagnosis not present

## 2018-01-03 DIAGNOSIS — E785 Hyperlipidemia, unspecified: Secondary | ICD-10-CM | POA: Diagnosis not present

## 2018-01-03 DIAGNOSIS — I10 Essential (primary) hypertension: Secondary | ICD-10-CM

## 2018-01-03 MED ORDER — GABAPENTIN 400 MG PO CAPS: 400.0000 mg | ORAL_CAPSULE | Freq: Every day | ORAL | 3 refills | Status: DC

## 2018-01-03 MED ORDER — LORAZEPAM 1 MG PO TABS: 1.0000 mg | ORAL_TABLET | Freq: Every day | ORAL | 1 refills | Status: DC

## 2018-01-03 NOTE — Assessment & Plan Note (Signed)
The current medical regimen is effective;  continue present plan and medications.  

## 2018-01-03 NOTE — Progress Notes (Signed)
BP 116/67 (BP Location: Left Arm)   Pulse 84   Temp 97.8 F (36.6 C) (Oral)   Ht 5\' 11"  (1.803 m)   Wt 285 lb (129.3 kg)   SpO2 95%   BMI 39.75 kg/m    Subjective:    Patient ID: Nicholas [name], male    DOB: 1945-03-14, 72 y.o.   MRN: [id number]  HPI: Nicholas [name] is a 72 y.o. male  Chief Complaint  Patient presents with  . Follow-up  . Hyperlipidemia  . Hypertension  Patient follow-up has all in all been doing well except intermittently elevated blood pressure.  Patient is also had low blood pressure especially at home with lightheaded dizziness.  Had previously been on blood pressure medications and on chart review most recently did the best on Azor 10/40.  Had also been on separate components of this medication and hydrochlorothiazide 25 mg. On chart review here blood pressure has been elevated but some low readings. Wife brings up cardiologist and diagnosis of orthostatic hypotension.  Patient with standing is drop blood pressure readings. Blood pressure was repeated and was 116/75. Anxiety still going on son that is had so much trouble with it is moved back into the house is wife who has medical issues and son who has anoxic brain injury issues.  Relevant past medical, surgical, family and social history reviewed and updated as indicated. Interim medical history since our last visit reviewed. Allergies and medications reviewed and updated.  Review of Systems  Constitutional: Negative.   Respiratory: Negative.   Cardiovascular: Negative.     Per HPI unless specifically indicated above     Objective:    BP 116/67 (BP Location: Left Arm)   Pulse 84   Temp 97.8 F (36.6 C) (Oral)   Ht 5\' 11"  (1.803 m)   Wt 285 lb (129.3 kg)   SpO2 95%   BMI 39.75 kg/m   Wt Readings from Last 3 Encounters:  01/03/18 285 lb (129.3 kg)  12/24/17 294 lb (133.4 kg)  10/30/17 294 lb (133.4 kg)    Physical Exam Constitutional:      Appearance: He is well-developed.  HENT:   Head: Normocephalic and atraumatic.  Eyes:     Conjunctiva/sclera: Conjunctivae normal.  Neck:     Musculoskeletal: Normal range of motion.  Cardiovascular:     Rate and Rhythm: Normal rate and regular rhythm.     Heart sounds: Normal heart sounds.  Pulmonary:     Effort: Pulmonary effort is normal.     Breath sounds: Normal breath sounds.  Musculoskeletal: Normal range of motion.  Skin:    Findings: No erythema.  Neurological:     Mental Status: He is alert and oriented to person, place, and time.  Psychiatric:        Behavior: Behavior normal.        Thought Content: Thought content normal.        Judgment: Judgment normal.     Results for orders placed or performed in visit on 09/12/17  CUP PACEART REMOTE DEVICE CHECK  Result Value Ref Range   Pulse Generator Manufacturer MERM    Date Time Interrogation Session [id number]    Pulse Gen Model [id number] Advisa DR MRI    Pulse Gen Serial Number [id number]    Clinic Name [facility]    Implantable Pulse Generator Type Implantable Pulse Generator    Implantable Pulse Generator Implant Date [id number]    Implantable Lead Manufacturer [facility]    Implantable  Lead Model [id number] SelectSecure MRI SureScan    Implantable Lead Serial Number [id number]    Implantable Lead Implant Date 32355732    Implantable Lead Location Detail 1 UNKNOWN    Implantable Lead Special Function Bundle of His    Implantable Lead Location [id number]    Implantable Lead Manufacturer MERM    Implantable Lead Model 5076 CapSureFix Novus MRI SureScan    Implantable Lead Serial Number [id number]    Implantable Lead Implant Date 37628315    Implantable Lead Location Detail 1 APPENDAGE    Implantable Lead Location [id number]       Assessment & Plan:   Problem List Items Addressed This Visit      Cardiovascular and Mediastinum   HTN (hypertension) - Primary (Chronic)   Relevant Orders   Basic metabolic panel   LP+ALT+AST Piccolo, Waived   Chronic orthostatic  hypotension    Discussed elevated blood pressure reading sitting low blood pressure readings standing discussed care and treatment.  Reviewed cardiology recommendations with patient        Other   Dyslipidemia (Chronic)    The current medical regimen is effective;  continue present plan and medications.       Relevant Orders   Basic metabolic panel   LP+ALT+AST Piccolo, Waived   Anxiety    The current medical regimen is effective;  continue present plan and medications.       Relevant Medications   LORazepam (ATIVAN) 1 MG tablet       Follow up plan: Return in about 6 months (around 07/05/2018) for Physical Exam.

## 2018-01-03 NOTE — Assessment & Plan Note (Signed)
Discussed elevated blood pressure reading sitting low blood pressure readings standing discussed care and treatment.  Reviewed cardiology recommendations with patient

## 2018-01-04 ENCOUNTER — Encounter: Payer: Self-pay | Admitting: Family Medicine

## 2018-01-04 LAB — LP+ALT+AST PICCOLO, WAIVED
ALT (SGPT) Piccolo, Waived: 24 U/L (ref 10–47)
AST (SGOT) Piccolo, Waived: 34 U/L (ref 11–38)
CHOL/HDL RATIO PICCOLO,WAIVE: 3.5 mg/dL
CHOLESTEROL PICCOLO, WAIVED: 198 mg/dL (ref <200)
HDL Chol Piccolo, Waived: 54 mg/dL — ABNORMAL LOW (ref >59)
LDL CHOL CALC PICCOLO WAIVED: 90 mg/dL (ref <100)
Triglycerides Piccolo,Waived: 264 mg/dL — ABNORMAL HIGH (ref <150)
VLDL Chol Calc Piccolo,Waive: 53 mg/dL — ABNORMAL HIGH (ref <30)

## 2018-01-04 LAB — BASIC METABOLIC PANEL
BUN / CREAT RATIO: 16 (ref 10–24)
BUN: 18 mg/dL (ref 8–27)
CALCIUM: 9.7 mg/dL (ref 8.6–10.2)
CO2: 27 mmol/L (ref 20–29)
Chloride: 100 mmol/L (ref 96–106)
Creatinine, Ser: 1.11 mg/dL (ref 0.76–1.27)
GFR, EST AFRICAN AMERICAN: 76 mL/min/{1.73_m2} (ref >59)
GFR, EST NON AFRICAN AMERICAN: 66 mL/min/{1.73_m2} (ref >59)
Glucose: 132 mg/dL — ABNORMAL HIGH (ref 65–99)
POTASSIUM: 4.8 mmol/L (ref 3.5–5.2)
Sodium: 140 mmol/L (ref 134–144)

## 2018-01-05 ENCOUNTER — Emergency Department: Payer: Medicare Other

## 2018-01-05 ENCOUNTER — Other Ambulatory Visit: Payer: Self-pay

## 2018-01-05 ENCOUNTER — Emergency Department
Admission: EM | Admit: 2018-01-05 | Discharge: 2018-01-05 | Disposition: A | Discharge status: Home / Self Care | Payer: Medicare Other | Attending: Emergency Medicine | Admitting: Emergency Medicine

## 2018-01-05 DIAGNOSIS — Z96653 Presence of artificial knee joint, bilateral: Secondary | ICD-10-CM | POA: Insufficient documentation

## 2018-01-05 DIAGNOSIS — Z7901 Long term (current) use of anticoagulants: Secondary | ICD-10-CM | POA: Insufficient documentation

## 2018-01-05 DIAGNOSIS — R1011 Right upper quadrant pain: Secondary | ICD-10-CM | POA: Insufficient documentation

## 2018-01-05 DIAGNOSIS — I10 Essential (primary) hypertension: Secondary | ICD-10-CM | POA: Diagnosis not present

## 2018-01-05 DIAGNOSIS — F419 Anxiety disorder, unspecified: Secondary | ICD-10-CM | POA: Insufficient documentation

## 2018-01-05 DIAGNOSIS — R1031 Right lower quadrant pain: Secondary | ICD-10-CM | POA: Diagnosis not present

## 2018-01-05 DIAGNOSIS — F329 Major depressive disorder, single episode, unspecified: Secondary | ICD-10-CM | POA: Insufficient documentation

## 2018-01-05 DIAGNOSIS — Z96643 Presence of artificial hip joint, bilateral: Secondary | ICD-10-CM | POA: Insufficient documentation

## 2018-01-05 DIAGNOSIS — Z79899 Other long term (current) drug therapy: Secondary | ICD-10-CM | POA: Insufficient documentation

## 2018-01-05 DIAGNOSIS — Z95810 Presence of automatic (implantable) cardiac defibrillator: Secondary | ICD-10-CM | POA: Diagnosis not present

## 2018-01-05 DIAGNOSIS — R195 Other fecal abnormalities: Secondary | ICD-10-CM | POA: Diagnosis not present

## 2018-01-05 DIAGNOSIS — K5900 Constipation, unspecified: Secondary | ICD-10-CM | POA: Diagnosis not present

## 2018-01-05 LAB — URINALYSIS, COMPLETE (UACMP) WITH MICROSCOPIC
Bacteria, UA: NONE SEEN
Bilirubin Urine: NEGATIVE
Glucose, UA: NEGATIVE mg/dL
Hgb urine dipstick: NEGATIVE
Ketones, ur: 5 mg/dL — AB
Nitrite: NEGATIVE
Protein, ur: 100 mg/dL — AB
Specific Gravity, Urine: 1.039 — ABNORMAL HIGH (ref 1.005–1.030)
pH: 5 (ref 5.0–8.0)

## 2018-01-05 LAB — CBC
HCT: 49.2 % (ref 39.0–52.0)
Hemoglobin: 16.3 g/dL (ref 13.0–17.0)
MCH: 30.8 pg (ref 26.0–34.0)
MCHC: 33.1 g/dL (ref 30.0–36.0)
MCV: 92.8 fL (ref 80.0–100.0)
Platelets: 194 10*3/uL (ref 150–400)
RBC: 5.3 MIL/uL (ref 4.22–5.81)
RDW: 12.8 % (ref 11.5–15.5)
WBC: 7.5 10*3/uL (ref 4.0–10.5)
nRBC: 0 % (ref 0.0–0.2)

## 2018-01-05 LAB — COMPREHENSIVE METABOLIC PANEL
ALT: 20 U/L (ref 0–44)
AST: 30 U/L (ref 15–41)
Albumin: 4.7 g/dL (ref 3.5–5.0)
Alkaline Phosphatase: 50 U/L (ref 38–126)
Anion gap: 8 (ref 5–15)
BUN: 19 mg/dL (ref 8–23)
CO2: 25 mmol/L (ref 22–32)
Calcium: 9.5 mg/dL (ref 8.9–10.3)
Chloride: 104 mmol/L (ref 98–111)
Creatinine, Ser: 1.34 mg/dL — ABNORMAL HIGH (ref 0.61–1.24)
GFR calc Af Amer: >60 mL/min (ref >60)
GFR calc non Af Amer: 53 mL/min — ABNORMAL LOW (ref >60)
Glucose, Bld: 152 mg/dL — ABNORMAL HIGH (ref 70–99)
Potassium: 4.4 mmol/L (ref 3.5–5.1)
Sodium: 137 mmol/L (ref 135–145)
Total Bilirubin: 1 mg/dL (ref 0.3–1.2)
Total Protein: 7.9 g/dL (ref 6.5–8.1)

## 2018-01-05 LAB — TROPONIN I: Troponin I: <0.03 ng/mL (ref <0.03)

## 2018-01-05 LAB — LIPASE, BLOOD: Lipase: 40 U/L (ref 11–51)

## 2018-01-05 MED ORDER — FENTANYL CITRATE (PF) 100 MCG/2ML IJ SOLN
50.0000 ug | Freq: Once | INTRAMUSCULAR | Status: AC
  Administered 2018-01-05: 50 ug via INTRAVENOUS
  Filled 2018-01-05: qty 2

## 2018-01-05 MED ORDER — IOPAMIDOL (ISOVUE-370) INJECTION 76%
100.0000 mL | Freq: Once | INTRAVENOUS | Status: AC | PRN
  Administered 2018-01-05: 100 mL via INTRAVENOUS

## 2018-01-05 MED ORDER — ONDANSETRON HCL 4 MG/2ML IJ SOLN
4.0000 mg | Freq: Once | INTRAMUSCULAR | Status: AC
  Administered 2018-01-05: 4 mg via INTRAVENOUS
  Filled 2018-01-05: qty 2

## 2018-01-05 MED ORDER — IOPAMIDOL (ISOVUE-300) INJECTION 61%
30.0000 mL | Freq: Once | INTRAVENOUS | Status: AC
  Administered 2018-01-05: 30 mL via ORAL

## 2018-01-05 NOTE — ED Provider Notes (Signed)
Patient had unremarkable ultrasound.  Obtain CT abdomen pelvis, also no acute findings, moderate stool burden.  Offered admission to the patient for pain control however he would strongly prefer to go home, he states that he will take pain medication at home and we will give this 24 hours if any worsening he will return to the ED.  I feel this is a reasonable plan but asked him to certainly return sooner if pain is worsening   Lavonia Drafts, MD 01/05/18 1740

## 2018-01-05 NOTE — ED Notes (Signed)
Patient transported to Ultrasound 

## 2018-01-05 NOTE — ED Triage Notes (Signed)
First RN: pt brought over from [facility] for RUQ abd pain x 2 weeks, worse over 2 days. No N&V&D. Prescribed oxycodone for pain at home. Ambulatory from [facility]. Placed in wheelchair upon arrival.

## 2018-01-05 NOTE — ED Notes (Signed)
CT notified that pt finished oral contrast

## 2018-01-05 NOTE — ED Notes (Signed)
Pt verbalized understanding of discharge instructions. NAD at this time. 

## 2018-01-05 NOTE — ED Triage Notes (Signed)
Pt comes from [facility] with c/o right sided flank pain that started 2 weeks ago. Pt states it has gotten worse today and caused him to come into ED.  Pt denies N/V/D, fever and chills. Pt denies any urinary symptoms.

## 2018-01-05 NOTE — ED Provider Notes (Signed)
[facility] Emergency Department Provider Note  ____________________________________________  Time seen: Approximately 2:07 PM  I have reviewed the triage vital signs and the nursing notes.   HISTORY  Chief Complaint Abdominal Pain   HPI Nicholas [name] is a 72 y.o. male with history of DVT/PE on Eliquis, complete heart block status post pacemaker, hypertension, hyperlipidemia, gross hematuria who presents for evaluation of abdominal pain.  Patient reports 2 weeks of intermittent sharp right upper quadrant abdominal pain which became constant and more severe over the last 2 days.  No nausea, vomiting, diarrhea, fever, chills, chest pain, leg swelling, hemoptysis, dysuria or hematuria.  Patient denies ever having similar pain.  Patient reports that the pain is worse with deep inspiration.  Denies any missed Eliquis doses.  No prior abdominal surgeries.  Currently pain is moderate in intensity.  Past Medical History:  Diagnosis Date  . Anterior urethral stricture   . Anxiety   . Arthritis    a. knees, hips, hands;  b. 11/2013 s/p L TKA @ [place].  . Bile reflux gastritis   . Bulging lumbar disc   . BXO (balanitis xerotica obliterans)   . 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 Depression   . DVT (deep venous thrombosis) ([place])   . Erosive esophagitis   . Gross hematuria   . Hyperlipemia   . Hypertension    borderline  . Internal hemorrhoids   . Phimosis   . Pulmonary embolism [facility])     Patient Active Problem List   Diagnosis Date Noted  . Edema of lower extremity 07/02/2017  . Syncopal episodes 06/11/2017  . Syncope 06/11/2017  . Lumbar radiculopathy 06/05/2017  . Trochanteric bursitis 06/05/2017  . Complete heart block ([place]) 02/16/2016  . Low back pain 11/16/2015  . History of total knee replacement, bilateral 08/10/2015  . Bunion 08/10/2015  . Degeneration of lumbar intervertebral disc  05/11/2015  . Abdominal aortic atherosclerosis ([place]) 05/11/2015  . Depression 10/20/2014  . Onychomycosis 10/20/2014  . Anxiety 10/20/2014  . Mechanical complication of prosthetic hip implant ([place]) 07/13/2014  . Chronic orthostatic hypotension 04/17/2013  . HTN (hypertension) 04/17/2013  . Dyslipidemia 04/17/2013  . GERD (gastroesophageal reflux disease) 04/17/2013  . Pulmonary embolism ([place]) 04/17/2013  . Advanced care planning/counseling discussion 04/14/2013    Past Surgical History:  Procedure Laterality Date  . BUNIONECTOMY Bilateral 01/06/2015   Procedure: BUNIONECTOMY;  Surgeon: [name], MD;  Location: [facility] ORS;  Service: Orthopedics;  Laterality: Bilateral;  . CARDIAC CATHETERIZATION  ~ 2005   "once"  . CATARACT EXTRACTION W/ INTRAOCULAR LENS  IMPLANT, BILATERAL Bilateral ~ 2010  . COLONOSCOPY WITH PROPOFOL N/A 12/07/2015   Procedure: COLONOSCOPY WITH PROPOFOL;  Surgeon: Lollie Sails, MD;  Location: [facility] ENDOSCOPY;  Service: Endoscopy;  Laterality: N/A;  . EP IMPLANTABLE DEVICE N/A 02/16/2016   MDT dual chamber (His Bundle) pacemaker implanted by Dr Caryl Comes for intermittent complete heart block  . ESOPHAGOGASTRODUODENOSCOPY (EGD) WITH PROPOFOL N/A 12/07/2015   Procedure: ESOPHAGOGASTRODUODENOSCOPY (EGD) WITH PROPOFOL;  Surgeon: Lollie Sails, MD;  Location: [facility] ENDOSCOPY;  Service: Endoscopy;  Laterality: N/A;  . ESOPHAGOGASTRODUODENOSCOPY (EGD) WITH PROPOFOL N/A 05/17/2017   Procedure: ESOPHAGOGASTRODUODENOSCOPY (EGD) WITH PROPOFOL;  Surgeon: Lollie Sails, MD;  Location: [facility] ENDOSCOPY;  Service: Endoscopy;  Laterality: N/A;  . HAMMER TOE SURGERY Bilateral 01/06/2015   Procedure: HAMMER TOE CORRECTION;  Surgeon: [name], MD;  Location: [facility] ORS;  Service: Orthopedics;  Laterality: Bilateral;  . KNEE CARTILAGE SURGERY Left 1965   "football injury"  . Left Total Knee Arthroplasty     a. 11/2013 [facility].  Marland Kitchen PILONIDAL CYST EXCISION  1970's  . TOTAL HIP  ARTHROPLASTY Right 2004  . TOTAL HIP ARTHROPLASTY Left 2006  . UPPER GI ENDOSCOPY      Prior to Admission medications   Medication Sig Start Date End Date Taking? Authorizing Provider  acetaminophen (TYLENOL) 500 MG tablet Take 2,000 mg by mouth daily as needed for moderate pain or headache.    [provider]  apixaban (ELIQUIS) 5 MG TABS tablet Take 1 tablet (5 mg total) by mouth 2 (two) times daily. 07/02/17   [name], MD  ascorbic acid (VITAMIN C) 250 MG tablet Take 250 mg by mouth daily.     [provider]  b complex vitamins capsule Take 1 capsule by mouth daily.    [provider]  Cranberry 500 MG CAPS Take 1 capsule by mouth daily.     [provider]  Cyanocobalamin (B-12) 1000 MCG CAPS Take 1,000 mcg by mouth daily.     [provider]  ferrous sulfate 325 (65 FE) MG tablet Take 325 mg by mouth daily with breakfast.    [provider]  flecainide (TAMBOCOR) 100 MG tablet Take 1 tablet (100 mg total) by mouth 2 (two) times daily. 02/20/17   Deboraha Sprang, MD  gabapentin (NEURONTIN) 400 MG capsule Take 1 capsule (400 mg total) by mouth daily. 01/03/18   [name], MD  HYDROcodone-acetaminophen (NORCO/VICODIN) 5-325 MG tablet Take 1 tablet by mouth 2 (two) times daily at 10 AM and 5 PM. 12/24/17   [name], MD  hyoscyamine (LEVSIN, ANASPAZ) 0.125 MG tablet Take 0.125 mg by mouth every 6 (six) hours as needed for bladder spasms or cramping.  09/07/15   [provider]  LORazepam (ATIVAN) 1 MG tablet Take 1 tablet (1 mg total) by mouth at bedtime. 01/03/18   [name], MD  Magnesium 400 MG TABS Take 400 mg by mouth daily.     [provider]  Melatonin 5 MG TABS Take 10 mg by mouth at bedtime.    [provider]  Multiple Vitamin (MULTI-VITAMINS) TABS Take 1 tablet by mouth daily.     [provider]  Omega-3 Fatty Acids (FISH OIL) 1200 MG CAPS Take 1,200 mg by mouth  daily.    [provider]  oxymetazoline (AFRIN) 0.05 % nasal spray Place 2 sprays into both nostrils at bedtime as needed for congestion.    [provider]  pantoprazole (PROTONIX) 40 MG tablet TAKE 1 TABLET (40 MG TOTAL) BY MOUTH ONCE DAILY TAKE 30 MINS BEFORE MEAL 03/20/17   [provider]  QUEtiapine Fumarate (SEROQUEL XR) 150 MG 24 hr tablet Take 1 tablet (150 mg total) by mouth at bedtime. 07/02/17   [name], MD  simvastatin (ZOCOR) 20 MG tablet Take 1 tablet (20 mg total) by mouth daily. 07/02/17   [name], MD  solifenacin (VESICARE) 10 MG tablet Take 1 tablet (10 mg total) by mouth daily. 07/02/17   [name], MD  venlafaxine XR (EFFEXOR XR) 75 MG 24 hr capsule Take 3 capsules (225 mg total) by mouth daily. 07/02/17   [name], MD    Allergies Patient has no known allergies.  Family History  Problem Relation Age of Onset  . Stroke Father  deceased  . Diabetes Father   . Hypertension Father   . Alcoholism Mother        died in her 2's.  . Cancer Mother   . Cancer Sister   . Prostate cancer Paternal Grandfather     Social History Social History   Tobacco Use  . Smoking status: Never Smoker  . Smokeless tobacco: Never Used  Substance Use Topics  . Alcohol use: Yes    Alcohol/week: 0.0 standard drinks    Comment: 2 DRINKS/MONTH  . Drug use: No    Review of Systems  Constitutional: Negative for fever. Eyes: Negative for visual changes. ENT: Negative for sore throat. Neck: No neck pain  Cardiovascular: Negative for chest pain. Respiratory: Negative for shortness of breath. Gastrointestinal: + RUQ abdominal pain. No vomiting or diarrhea. Genitourinary: Negative for dysuria. Musculoskeletal: Negative for back pain. Skin: Negative for rash. Neurological: Negative for headaches, weakness or numbness. Psych: No SI or HI  ____________________________________________   PHYSICAL EXAM:  VITAL SIGNS: ED  Triage Vitals  Enc Vitals Group     BP 01/05/18 1137 (!) 142/88     Pulse Rate 01/05/18 1137 85     Resp 01/05/18 1137 12     Temp 01/05/18 1137 98.5 F (36.9 C)     Temp Source 01/05/18 1137 Oral     SpO2 01/05/18 1137 94 %     Weight 01/05/18 1138 283 lb (128.4 kg)     Height 01/05/18 1138 5\' 11"  (1.803 m)     Head Circumference --      Peak Flow --      Pain Score 01/05/18 1143 9     Pain Loc --      Pain Edu? --      Excl. in [place]? --     Constitutional: Alert and oriented. Well appearing and in no apparent distress. HEENT:      Head: Normocephalic and atraumatic.         Eyes: Conjunctivae are normal. Sclera is non-icteric.       Mouth/Throat: Mucous membranes are moist.       Neck: Supple with no signs of meningismus. Cardiovascular: Regular rate and rhythm. No murmurs, gallops, or rubs. 2+ symmetrical distal pulses are present in all extremities. No JVD. Respiratory: Normal respiratory effort. Lungs are clear to auscultation bilaterally. No wheezes, crackles, or rhonchi.  Gastrointestinal: Soft, tender to palpation on the right upper quadrant with positive Murphy sign and localized guarding, non distended with positive bowel sounds. No rebound Genitourinary: No CVA tenderness. Musculoskeletal: Nontender with normal range of motion in all extremities. No edema, cyanosis, or erythema of extremities. Neurologic: Normal speech and language. Face is symmetric. Moving all extremities. No gross focal neurologic deficits are appreciated. Skin: Skin is warm, dry and intact. No rash noted. Psychiatric: Mood and affect are normal. Speech and behavior are normal.  ____________________________________________   LABS (all labs ordered are listed, but only abnormal results are displayed)  Labs Reviewed  COMPREHENSIVE METABOLIC PANEL - Abnormal; Notable for the following components:      Result Value   Glucose, Bld 152 (*)    Creatinine, Ser 1.34 (*)    GFR calc non Af Amer 53 (*)      All other components within normal limits  URINALYSIS, COMPLETE (UACMP) WITH MICROSCOPIC - Abnormal; Notable for the following components:   Color, Urine AMBER (*)    APPearance CLOUDY (*)    Specific Gravity, Urine 1.039 (*)  Ketones, ur 5 (*)    Protein, ur 100 (*)    Leukocytes, UA SMALL (*)    All other components within normal limits  URINE CULTURE  LIPASE, BLOOD  CBC  TROPONIN I   ____________________________________________  EKG  ED ECG REPORT I, Rudene Re, the attending physician, personally viewed and interpreted this ECG.  Normal sinus rhythm, rate of 72, first-degree AV block, incomplete RBBB, LAFB, no ST elevations or depressions.  No significant changes when compared to prior. ____________________________________________  RADIOLOGY  RUQ [place]: PND ____________________________________________   PROCEDURES  Procedure(s) performed: None Procedures Critical Care performed:  None ____________________________________________   INITIAL IMPRESSION / ASSESSMENT AND PLAN / ED COURSE  72 y.o. male with history of DVT/PE on Eliquis, complete heart block status post pacemaker, hypertension, hyperlipidemia, gross hematuria who presents for evaluation of RUQ abdominal pain.  Patient is well-appearing and in no distress with normal vital signs, abdomen is soft with significant tenderness to palpation of the right upper quadrant, positive Murphy sign and localized guarding.  Labs including CBC, CMP and lipase are all within normal limits. Ddx cholelithiasis versus cholecystitis versus kidney stone versus pyelonephritis versus pancreatitis versus appendicitis.  PE is obviously in the differential diagnosis with pleuritic chest pain and prior history of PE however patient has no tachycardia, tachypnea, hypoxia, he is currently on Eliquis and has significant tenderness on exam therefore I believe his etiology is most likely intra-abdominal.  We will give fentanyl for pain  and send patient for right upper quadrant ultrasound.  Urinalysis also pending.    _________________________ 2:53 PM on 01/05/2018 -----------------------------------------  UA negative for bacteria but showing small leuks with 21-50 WBCs and 11-20 RBCs.  Patient has no symptoms of a UTI.  Culture has been sent.  Right upper quadrant ultrasound is pending.  If that is negative plan for CT abdomen pelvis.  Discussed plan and transferred care to Dr. Corky Downs at 3 PM   As part of my medical decision making, I reviewed the following data within the [place] notes reviewed and incorporated, Labs reviewed , EKG interpreted , Old EKG reviewed, Old chart reviewed, Notes from prior ED visits and [place] Controlled Substance Database    Pertinent labs & imaging results that were available during my care of the patient were reviewed by me and considered in my medical decision making (see chart for details).    ____________________________________________   FINAL CLINICAL IMPRESSION(S) / ED DIAGNOSES  Final diagnoses:  RUQ abdominal pain      NEW MEDICATIONS STARTED DURING THIS VISIT:  ED Discharge Orders    None       Note:  This document was prepared using Dragon voice recognition software and may include unintentional dictation errors.    Rudene Re, MD 01/05/18 [phone]

## 2018-01-07 LAB — URINE CULTURE: Culture: 30000 — AB

## 2018-01-25 ENCOUNTER — Ambulatory Visit: Payer: Self-pay

## 2018-01-25 NOTE — Telephone Encounter (Signed)
If he doesn't live with them, he doesn't need tamiflu. If he lives with them, let me know and I'll send it in.

## 2018-01-25 NOTE — Telephone Encounter (Signed)
Pt called to state that he has been exposed to someone over the holiday who has been Dx with flu.  He would like Tamiflu as preventative sent to his pharmacy [facility]. Will route to practice for advice.  Reason for Disposition . Caller requesting a NON-URGENT new prescription or refill and triager unable to refill per unit policy  Answer Assessment - Initial Assessment Questions 1. SYMPTOMS: "Do you have any symptoms?"     No symptoms 2. SEVERITY: If symptoms are present, ask "Are they mild, moderate or severe?"     No symptoms Pt has bee exposed to Flu and would like tamiflu  Protocols used: MEDICATION QUESTION CALL-A-AH

## 2018-01-25 NOTE — Telephone Encounter (Signed)
Left message on machine for pt to return call to the office.  

## 2018-01-28 NOTE — Telephone Encounter (Signed)
Left message on machine for pt to return call to the office.  

## 2018-01-29 NOTE — Telephone Encounter (Signed)
Called and left patient a VM asking for him to please return my call.  

## 2018-01-29 NOTE — Telephone Encounter (Signed)
Will close encounter due to no response from patient.

## 2018-02-06 LAB — CUP PACEART REMOTE DEVICE CHECK
Battery Remaining Longevity: 80 mo
Battery Voltage: 3.01 V
Brady Statistic AP VP Percent: 11.63 %
Brady Statistic AP VS Percent: 0 %
Brady Statistic AS VP Percent: 88.28 %
Brady Statistic AS VS Percent: 0.08 %
Brady Statistic RA Percent Paced: 11.62 %
Brady Statistic RV Percent Paced: 99.87 %
Date Time Interrogation Session: 20191121012750
Implantable Lead Implant Date: 20180124
Implantable Lead Implant Date: 20180124
Implantable Lead Location: 753859
Implantable Lead Location: 753860
Implantable Lead Model: 3830
Implantable Lead Model: 5076
Implantable Pulse Generator Implant Date: 20180124
Lead Channel Impedance Value: 361 Ohm
Lead Channel Impedance Value: 380 Ohm
Lead Channel Impedance Value: 513 Ohm
Lead Channel Pacing Threshold Amplitude: 0.75 V
Lead Channel Pacing Threshold Pulse Width: 0.4 ms
Lead Channel Pacing Threshold Pulse Width: 0.4 ms
Lead Channel Sensing Intrinsic Amplitude: 1.875 mV
Lead Channel Sensing Intrinsic Amplitude: 1.875 mV
Lead Channel Sensing Intrinsic Amplitude: 4.75 mV
Lead Channel Sensing Intrinsic Amplitude: 4.75 mV
Lead Channel Setting Pacing Amplitude: 2 V
Lead Channel Setting Pacing Amplitude: 2 V
Lead Channel Setting Pacing Pulse Width: 1 ms
Lead Channel Setting Sensing Sensitivity: 0.9 mV
MDC IDC MSMT LEADCHNL RA IMPEDANCE VALUE: 494 Ohm
MDC IDC MSMT LEADCHNL RA PACING THRESHOLD AMPLITUDE: 0.625 V

## 2018-02-08 ENCOUNTER — Other Ambulatory Visit: Payer: Self-pay | Admitting: Internal Medicine

## 2018-02-25 ENCOUNTER — Encounter: Payer: Self-pay | Admitting: Anesthesiology

## 2018-02-25 ENCOUNTER — Encounter: Payer: Medicare Other | Admitting: Anesthesiology

## 2018-02-25 ENCOUNTER — Ambulatory Visit: Payer: Medicare Other | Attending: Anesthesiology | Admitting: Anesthesiology

## 2018-02-25 ENCOUNTER — Other Ambulatory Visit: Payer: Self-pay

## 2018-02-25 VITALS — BP 110/98 | HR 89 | Temp 98.2°F | Ht 71.0 in | Wt 283.0 lb

## 2018-02-25 DIAGNOSIS — M79641 Pain in right hand: Secondary | ICD-10-CM

## 2018-02-25 DIAGNOSIS — G894 Chronic pain syndrome: Secondary | ICD-10-CM | POA: Diagnosis present

## 2018-02-25 DIAGNOSIS — M47817 Spondylosis without myelopathy or radiculopathy, lumbosacral region: Secondary | ICD-10-CM | POA: Insufficient documentation

## 2018-02-25 DIAGNOSIS — F1190 Opioid use, unspecified, uncomplicated: Secondary | ICD-10-CM

## 2018-02-25 DIAGNOSIS — M170 Bilateral primary osteoarthritis of knee: Secondary | ICD-10-CM | POA: Insufficient documentation

## 2018-02-25 DIAGNOSIS — M5432 Sciatica, left side: Secondary | ICD-10-CM | POA: Diagnosis present

## 2018-02-25 DIAGNOSIS — M1611 Unilateral primary osteoarthritis, right hip: Secondary | ICD-10-CM | POA: Diagnosis present

## 2018-02-25 DIAGNOSIS — M79642 Pain in left hand: Secondary | ICD-10-CM | POA: Insufficient documentation

## 2018-02-25 DIAGNOSIS — M5431 Sciatica, right side: Secondary | ICD-10-CM | POA: Insufficient documentation

## 2018-02-25 MED ORDER — HYDROCODONE-ACETAMINOPHEN 5-325 MG PO TABS: 1.0000 | ORAL_TABLET | Freq: Four times a day (QID) | ORAL | 0 refills | Status: AC | PRN

## 2018-02-25 MED ORDER — HYDROCODONE-ACETAMINOPHEN 5-325 MG PO TABS: 1.0000 | ORAL_TABLET | Freq: Two times a day (BID) | ORAL | 0 refills | Status: AC

## 2018-02-25 NOTE — Progress Notes (Signed)
Nursing Pain Medication Assessment:  Safety precautions to be maintained throughout the outpatient stay will include: orient to surroundings, keep bed in low position, maintain call bell within reach at all times, provide assistance with transfer out of bed and ambulation.  Medication Inspection Compliance: Pill count conducted under aseptic conditions, in front of the patient. Neither the pills nor the bottle was removed from the patient's sight at any time. Once count was completed pills were immediately returned to the patient in their original bottle.  Medication: Hydrocodone/APAP Pill/Patch Count: 10 of 60 pills remain Pill/Patch Appearance: Markings consistent with prescribed medication Bottle Appearance: Standard pharmacy container. Clearly labeled. Filled Date: 01 / 07 / 2020 Last Medication intake:  Today

## 2018-02-27 NOTE — Progress Notes (Signed)
Subjective:  Patient ID: Nicholas [name], male    DOB: 1945/10/20  Age: 73 y.o. MRN: [id number]  CC: Medication Refill (Hydrocodone-acetamin.); Hand Pain; and Back Pain   Procedure: None  HPI Nicholas [name] presents for reevaluation.  He has been doing well since his last visit 2 months ago.  The quality characteristic distribution of his low back pain and knee pain is stable in nature.  No other changes are reported today.  Bowel and bladder function have also been stable.  Based on his narcotic assessment sheet he continues to derive good functional lifestyle improvement with his medications.  Outpatient Medications Prior to Visit  Medication Sig Dispense Refill  . acetaminophen (TYLENOL) 500 MG tablet Take 2,000 mg by mouth daily as needed for moderate pain or headache.    Marland Kitchen apixaban (ELIQUIS) 5 MG TABS tablet Take 1 tablet (5 mg total) by mouth 2 (two) times daily. 180 tablet 4  . ascorbic acid (VITAMIN C) 250 MG tablet Take 250 mg by mouth daily.     Marland Kitchen b complex vitamins capsule Take 1 capsule by mouth daily.    . Cranberry 500 MG CAPS Take 1 capsule by mouth daily.     . Cyanocobalamin (B-12) 1000 MCG CAPS Take 1,000 mcg by mouth daily.     . ferrous sulfate 325 (65 FE) MG tablet Take 325 mg by mouth daily with breakfast.    . flecainide (TAMBOCOR) 100 MG tablet TAKE 1 TABLET BY MOUTH TWICE A DAY 180 tablet 0  . gabapentin (NEURONTIN) 400 MG capsule Take 1 capsule (400 mg total) by mouth daily. 90 capsule 3  . hyoscyamine (LEVSIN, ANASPAZ) 0.125 MG tablet Take 0.125 mg by mouth every 6 (six) hours as needed for bladder spasms or cramping.     Marland Kitchen LORazepam (ATIVAN) 1 MG tablet Take 1 tablet (1 mg total) by mouth at bedtime. 90 tablet 1  . Magnesium 400 MG TABS Take 400 mg by mouth daily.     . Melatonin 5 MG TABS Take 10 mg by mouth at bedtime.    . Multiple Vitamin (MULTI-VITAMINS) TABS Take 1 tablet by mouth daily.     . Omega-3 Fatty Acids (FISH OIL) 1200 MG CAPS Take 1,200 mg by  mouth daily.    Marland Kitchen oxymetazoline (AFRIN) 0.05 % nasal spray Place 2 sprays into both nostrils at bedtime as needed for congestion.    . pantoprazole (PROTONIX) 40 MG tablet TAKE 1 TABLET (40 MG TOTAL) BY MOUTH ONCE DAILY TAKE 30 MINS BEFORE MEAL  6  . QUEtiapine Fumarate (SEROQUEL XR) 150 MG 24 hr tablet Take 1 tablet (150 mg total) by mouth at bedtime. 90 tablet 4  . simvastatin (ZOCOR) 20 MG tablet Take 1 tablet (20 mg total) by mouth daily. 90 tablet 4  . solifenacin (VESICARE) 10 MG tablet Take 1 tablet (10 mg total) by mouth daily. 90 tablet 4  . venlafaxine XR (EFFEXOR XR) 75 MG 24 hr capsule Take 3 capsules (225 mg total) by mouth daily. 270 capsule 4  . HYDROcodone-acetaminophen (NORCO/VICODIN) 5-325 MG tablet Take 1 tablet by mouth 2 (two) times daily at 10 AM and 5 PM. 60 tablet 0   No facility-administered medications prior to visit.     Review of Systems CNS: No confusion or sedation Cardiac: No angina or palpitations GI: No abdominal pain or constipation Constitutional: No nausea vomiting fevers or chills  Objective:  BP (!) 110/98   Pulse 89   Temp 98.2  F (36.8 C)   Ht 5\' 11"  (1.803 m)   Wt 283 lb (128.4 kg)   SpO2 97%   BMI 39.47 kg/m    BP Readings from Last 3 Encounters:  02/25/18 (!) 110/98  01/05/18 (!) 165/91  01/03/18 116/67     Wt Readings from Last 3 Encounters:  02/25/18 283 lb (128.4 kg)  01/05/18 283 lb (128.4 kg)  01/03/18 285 lb (129.3 kg)     Physical Exam Pt is alert and oriented PERRL EOMI HEART IS RRR no murmur or rub LCTA no wheezing or rales MUSCULOSKELETAL reveals some paraspinous tenderness in the low back and lower extremity strength appears to be at baseline.  Labs  Lab Results  Component Value Date   HGBA1C 5.7 10/20/2014   Lab Results  Component Value Date   LDLCALC 95 07/02/2017   CREATININE 1.34 (H) 01/05/2018     -------------------------------------------------------------------------------------------------------------------- Lab Results  Component Value Date   WBC 7.5 01/05/2018   HGB 16.3 01/05/2018   HCT 49.2 01/05/2018   PLT 194 01/05/2018   GLUCOSE 152 (H) 01/05/2018   CHOL 198 01/03/2018   TRIG 264 (H) 01/03/2018   HDL 59 07/02/2017   LDLCALC 95 07/02/2017   ALT 20 01/05/2018   AST 30 01/05/2018   NA 137 01/05/2018   K 4.4 01/05/2018   CL 104 01/05/2018   CREATININE 1.34 (H) 01/05/2018   BUN 19 01/05/2018   CO2 25 01/05/2018   TSH 1.260 07/02/2017   INR 1.2 02/16/2014   HGBA1C 5.7 10/20/2014    --------------------------------------------------------------------------------------------------------------------- Ct Abdomen Pelvis W Contrast  Result Date: 01/05/2018 CLINICAL DATA:  Right side abdominal and flank pain. EXAM: CT ABDOMEN AND PELVIS WITH CONTRAST TECHNIQUE: Multidetector CT imaging of the abdomen and pelvis was performed using the standard protocol following bolus administration of intravenous contrast. CONTRAST:  130mL ISOVUE-370 IOPAMIDOL (ISOVUE-370) INJECTION 76% COMPARISON:  Ultrasound 01/05/2018.  CT 04/30/2015 FINDINGS: Lower chest: Elevation of the right hemidiaphragm with right base atelectasis. No effusions. Pacer wires noted in the right heart. Coronary artery calcifications noted in the left main and left anterior descending coronary arteries. Hepatobiliary: No focal hepatic abnormality. Gallbladder unremarkable. Pancreas: No focal abnormality or ductal dilatation. Spleen: No focal abnormality.  Normal size. Adrenals/Urinary Tract: No adrenal abnormality. No focal renal abnormality. No stones or hydronephrosis. Urinary bladder is unremarkable. Stomach/Bowel: Moderate stool burden throughout the colon. Appendix is normal. Stomach, large and small bowel grossly unremarkable. Vascular/Lymphatic: Aortic atherosclerosis. No enlarged abdominal or pelvic lymph nodes.  Reproductive: Obscured by beam hardening artifact from bilateral hip replacements. Other: No free fluid or free air. Musculoskeletal: No acute bony abnormality. Bilateral hip replacements noted. Degenerative changes in the lumbar spine. IMPRESSION: Elevation of the right hemidiaphragm with right base atelectasis. This is stable dating back to 2017. Coronary artery disease, aortic atherosclerosis. Moderate stool burden throughout the colon. No acute findings. Electronically Signed   By: Rolm Baptise M.D.   On: 01/05/2018 16:42   US Abdomen Limited Ruq  Result Date: 01/05/2018 CLINICAL DATA:  Acute right upper quadrant abdominal pain. EXAM: ULTRASOUND ABDOMEN LIMITED RIGHT UPPER QUADRANT COMPARISON:  None. FINDINGS: Gallbladder: No gallstones or wall thickening visualized. No sonographic Murphy sign noted by sonographer. Common bile duct: Diameter: 5 mm which is within normal limits. Liver: No focal lesion identified. Within normal limits in parenchymal echogenicity. Portal vein is patent on color Doppler imaging with normal direction of blood flow towards the liver. IMPRESSION: No abnormality seen in the right upper quadrant of  the abdomen. Electronically Signed   By: Marijo Conception, M.D.   On: 01/05/2018 15:31     Assessment & Plan:   Alexsis was seen today for medication refill, hand pain and back pain.  Diagnoses and all orders for this visit:  Chronic pain syndrome -     Ambulatory referral to Rheumatology  Bilateral sciatica  Osteoarthritis of right hip, unspecified osteoarthritis type  Facet arthritis of lumbosacral region  Bilateral hand pain -     Ambulatory referral to Rheumatology  Chronic, continuous use of opioids  Primary osteoarthritis of both knees  Other orders -     HYDROcodone-acetaminophen (NORCO/VICODIN) 5-325 MG tablet; Take 1 tablet by mouth 2 (two) times daily at 10 AM and 5 PM for 30 days. -     HYDROcodone-acetaminophen (NORCO/VICODIN) 5-325 MG tablet; Take 1  tablet by mouth every 6 (six) hours as needed for up to 30 days for moderate pain or severe pain.        ----------------------------------------------------------------------------------------------------------------------  Problem List Items Addressed This Visit    None    Visit Diagnoses    Chronic pain syndrome    -  Primary   Relevant Orders   Ambulatory referral to Rheumatology   Bilateral sciatica       Osteoarthritis of right hip, unspecified osteoarthritis type       Relevant Medications   HYDROcodone-acetaminophen (NORCO/VICODIN) 5-325 MG tablet   HYDROcodone-acetaminophen (NORCO/VICODIN) 5-325 MG tablet (Start on 03/29/2018)   Facet arthritis of lumbosacral region       Relevant Medications   HYDROcodone-acetaminophen (NORCO/VICODIN) 5-325 MG tablet   HYDROcodone-acetaminophen (NORCO/VICODIN) 5-325 MG tablet (Start on 03/29/2018)   Bilateral hand pain       Relevant Orders   Ambulatory referral to Rheumatology   Chronic, continuous use of opioids       Primary osteoarthritis of both knees            ----------------------------------------------------------------------------------------------------------------------  1. Chronic pain syndrome Secondary to the persistent nature of his hand pain which appears to be worsening I will refer him to Dr. Jefm Bryant for evaluation and treatment. - Ambulatory referral to Rheumatology  2. Bilateral sciatica Continue core stretching and strengthening exercises.  3. Osteoarthritis of right hip, unspecified osteoarthritis type As above and continue opioid therapy as currently prescribed with refills given for February 5 and March 6.  Return to clinic in 2 months  4. Facet arthritis of lumbosacral region As above  5. Bilateral hand pain As above - Ambulatory referral to Rheumatology  6. Chronic, continuous use of opioids Have reviewed the [facility] practitioner database information and it is appropriate.  He has failed  conservative therapy and has been stable on her current opioid regimen with good relief.  7. Primary osteoarthritis of both knees As above    ----------------------------------------------------------------------------------------------------------------------  I have changed Nicholas [name] "Tommy"'s HYDROcodone-acetaminophen. I am also having him start on HYDROcodone-acetaminophen. Additionally, I am having him maintain his oxymetazoline, B-12, ferrous sulfate, hyoscyamine, Fish Oil, acetaminophen, ascorbic acid, Cranberry, MULTI-VITAMINS, Magnesium, pantoprazole, b complex vitamins, Melatonin, venlafaxine XR, solifenacin, simvastatin, QUEtiapine Fumarate, apixaban, gabapentin, LORazepam, and flecainide.   Meds ordered this encounter  Medications  . HYDROcodone-acetaminophen (NORCO/VICODIN) 5-325 MG tablet    Sig: Take 1 tablet by mouth 2 (two) times daily at 10 AM and 5 PM for 30 days.    Dispense:  60 tablet    Refill:  0    30 day supply no early refills  . HYDROcodone-acetaminophen (  NORCO/VICODIN) 5-325 MG tablet    Sig: Take 1 tablet by mouth every 6 (six) hours as needed for up to 30 days for moderate pain or severe pain.    Dispense:  60 tablet    Refill:  0    30 day supply no early refills   Patient's Medications  New Prescriptions   HYDROCODONE-ACETAMINOPHEN (NORCO/VICODIN) 5-325 MG TABLET    Take 1 tablet by mouth every 6 (six) hours as needed for up to 30 days for moderate pain or severe pain.  Previous Medications   ACETAMINOPHEN (TYLENOL) 500 MG TABLET    Take 2,000 mg by mouth daily as needed for moderate pain or headache.   APIXABAN (ELIQUIS) 5 MG TABS TABLET    Take 1 tablet (5 mg total) by mouth 2 (two) times daily.   ASCORBIC ACID (VITAMIN C) 250 MG TABLET    Take 250 mg by mouth daily.    B COMPLEX VITAMINS CAPSULE    Take 1 capsule by mouth daily.   CRANBERRY 500 MG CAPS    Take 1 capsule by mouth daily.    CYANOCOBALAMIN (B-12) 1000 MCG CAPS    Take 1,000 mcg  by mouth daily.    FERROUS SULFATE 325 (65 FE) MG TABLET    Take 325 mg by mouth daily with breakfast.   FLECAINIDE (TAMBOCOR) 100 MG TABLET    TAKE 1 TABLET BY MOUTH TWICE A DAY   GABAPENTIN (NEURONTIN) 400 MG CAPSULE    Take 1 capsule (400 mg total) by mouth daily.   HYOSCYAMINE (LEVSIN, ANASPAZ) 0.125 MG TABLET    Take 0.125 mg by mouth every 6 (six) hours as needed for bladder spasms or cramping.    LORAZEPAM (ATIVAN) 1 MG TABLET    Take 1 tablet (1 mg total) by mouth at bedtime.   MAGNESIUM 400 MG TABS    Take 400 mg by mouth daily.    MELATONIN 5 MG TABS    Take 10 mg by mouth at bedtime.   MULTIPLE VITAMIN (MULTI-VITAMINS) TABS    Take 1 tablet by mouth daily.    OMEGA-3 FATTY ACIDS (FISH OIL) 1200 MG CAPS    Take 1,200 mg by mouth daily.   OXYMETAZOLINE (AFRIN) 0.05 % NASAL SPRAY    Place 2 sprays into both nostrils at bedtime as needed for congestion.   PANTOPRAZOLE (PROTONIX) 40 MG TABLET    TAKE 1 TABLET (40 MG TOTAL) BY MOUTH ONCE DAILY TAKE 30 MINS BEFORE MEAL   QUETIAPINE FUMARATE (SEROQUEL XR) 150 MG 24 HR TABLET    Take 1 tablet (150 mg total) by mouth at bedtime.   SIMVASTATIN (ZOCOR) 20 MG TABLET    Take 1 tablet (20 mg total) by mouth daily.   SOLIFENACIN (VESICARE) 10 MG TABLET    Take 1 tablet (10 mg total) by mouth daily.   VENLAFAXINE XR (EFFEXOR XR) 75 MG 24 HR CAPSULE    Take 3 capsules (225 mg total) by mouth daily.  Modified Medications   Modified Medication Previous Medication   HYDROCODONE-ACETAMINOPHEN (NORCO/VICODIN) 5-325 MG TABLET HYDROcodone-acetaminophen (NORCO/VICODIN) 5-325 MG tablet      Take 1 tablet by mouth 2 (two) times daily at 10 AM and 5 PM for 30 days.    Take 1 tablet by mouth 2 (two) times daily at 10 AM and 5 PM.  Discontinued Medications   No medications on file   ----------------------------------------------------------------------------------------------------------------------  Follow-up: Return in about 2 months (around 04/26/2018) for  evaluation, med  refill.    [name], MD

## 2018-03-04 ENCOUNTER — Telehealth: Payer: Self-pay | Admitting: Anesthesiology

## 2018-03-04 NOTE — Telephone Encounter (Signed)
PA sent via covermymeds [id number]

## 2018-03-04 NOTE — Telephone Encounter (Signed)
Pt called and left a voicemail stating that he needs PA for hydrocodone.

## 2018-03-04 NOTE — Telephone Encounter (Signed)
Patient lvmail stating [facility] only gave [facility]. He needs PA and script sent for the rest of the month as they used the script already sent in.  [facility] [place] [facility] and [facility] same thing [phone]

## 2018-03-04 NOTE — Telephone Encounter (Signed)
Will need to ask Dr Andree Elk about additional Rx.

## 2018-03-05 ENCOUNTER — Telehealth: Payer: Self-pay | Admitting: *Deleted

## 2018-03-05 NOTE — Telephone Encounter (Signed)
Dr Andree Elk please. I am not familiar with this patient.

## 2018-03-07 DIAGNOSIS — M159 Polyosteoarthritis, unspecified: Secondary | ICD-10-CM | POA: Diagnosis not present

## 2018-03-07 DIAGNOSIS — M79641 Pain in right hand: Secondary | ICD-10-CM | POA: Diagnosis not present

## 2018-03-07 DIAGNOSIS — M19042 Primary osteoarthritis, left hand: Secondary | ICD-10-CM | POA: Diagnosis not present

## 2018-03-07 DIAGNOSIS — M19041 Primary osteoarthritis, right hand: Secondary | ICD-10-CM | POA: Diagnosis not present

## 2018-03-07 DIAGNOSIS — M79642 Pain in left hand: Secondary | ICD-10-CM | POA: Diagnosis not present

## 2018-03-07 NOTE — Telephone Encounter (Signed)
Spoke with Nicholas [name] and she reports that the problem has been handled, PA sent and Rx filled.

## 2018-03-13 ENCOUNTER — Ambulatory Visit (INDEPENDENT_AMBULATORY_CARE_PROVIDER_SITE_OTHER): Payer: Medicare Other

## 2018-03-13 DIAGNOSIS — I442 Atrioventricular block, complete: Secondary | ICD-10-CM

## 2018-03-13 DIAGNOSIS — I441 Atrioventricular block, second degree: Secondary | ICD-10-CM

## 2018-03-15 LAB — CUP PACEART REMOTE DEVICE CHECK
Battery Remaining Longevity: 78 mo
Brady Statistic AP VP Percent: 21.48 %
Brady Statistic AP VS Percent: 0 %
Brady Statistic AS VP Percent: 78.49 %
Brady Statistic AS VS Percent: 0.03 %
Brady Statistic RA Percent Paced: 21.45 %
Brady Statistic RV Percent Paced: 99.95 %
Implantable Lead Implant Date: 20180124
Implantable Lead Implant Date: 20180124
Implantable Lead Location: 753859
Implantable Lead Location: 753860
Implantable Lead Model: 3830
Implantable Lead Model: 5076
Implantable Pulse Generator Implant Date: 20180124
Lead Channel Impedance Value: 342 Ohm
Lead Channel Impedance Value: 361 Ohm
Lead Channel Impedance Value: 475 Ohm
Lead Channel Impedance Value: 532 Ohm
Lead Channel Pacing Threshold Amplitude: 0.625 V
Lead Channel Pacing Threshold Amplitude: 0.75 V
Lead Channel Pacing Threshold Pulse Width: 0.4 ms
Lead Channel Pacing Threshold Pulse Width: 0.4 ms
Lead Channel Sensing Intrinsic Amplitude: 1.625 mV
Lead Channel Sensing Intrinsic Amplitude: 1.625 mV
Lead Channel Sensing Intrinsic Amplitude: 4.25 mV
Lead Channel Setting Pacing Amplitude: 2 V
Lead Channel Setting Pacing Amplitude: 2 V
Lead Channel Setting Pacing Pulse Width: 1 ms
Lead Channel Setting Sensing Sensitivity: 0.9 mV
MDC IDC MSMT BATTERY VOLTAGE: 3.01 V
MDC IDC MSMT LEADCHNL RA SENSING INTR AMPL: 4.25 mV
MDC IDC SESS DTM: 20200219190943

## 2018-03-21 ENCOUNTER — Ambulatory Visit
Admission: RE | Admit: 2018-03-21 | Discharge: 2018-03-21 | Disposition: A | Discharge status: Home / Self Care | Payer: Medicare Other | Source: Ambulatory Visit | Attending: Family Medicine | Admitting: Family Medicine

## 2018-03-21 ENCOUNTER — Other Ambulatory Visit: Payer: Self-pay | Admitting: Family Medicine

## 2018-03-21 ENCOUNTER — Encounter: Payer: Self-pay | Admitting: Family Medicine

## 2018-03-21 ENCOUNTER — Other Ambulatory Visit: Payer: Self-pay

## 2018-03-21 ENCOUNTER — Ambulatory Visit (INDEPENDENT_AMBULATORY_CARE_PROVIDER_SITE_OTHER): Payer: Medicare Other | Admitting: Family Medicine

## 2018-03-21 VITALS — BP 130/87 | HR 86 | Temp 98.7°F | Ht 71.0 in | Wt 280.6 lb

## 2018-03-21 DIAGNOSIS — J22 Unspecified acute lower respiratory infection: Secondary | ICD-10-CM

## 2018-03-21 DIAGNOSIS — R0602 Shortness of breath: Secondary | ICD-10-CM

## 2018-03-21 DIAGNOSIS — R05 Cough: Secondary | ICD-10-CM | POA: Diagnosis not present

## 2018-03-21 MED ORDER — AZITHROMYCIN 250 MG PO TABS: ORAL_TABLET | ORAL | 0 refills | Status: DC

## 2018-03-21 MED ORDER — PROMETHAZINE-DM 6.25-15 MG/5ML PO SYRP: 2.5000 mL | ORAL_SOLUTION | Freq: Four times a day (QID) | ORAL | 0 refills | Status: DC | PRN

## 2018-03-21 MED ORDER — BENZONATATE 200 MG PO CAPS: 200.0000 mg | ORAL_CAPSULE | Freq: Three times a day (TID) | ORAL | 0 refills | Status: DC | PRN

## 2018-03-21 MED ORDER — ALBUTEROL SULFATE (2.5 MG/3ML) 0.083% IN NEBU
2.5000 mg | INHALATION_SOLUTION | Freq: Once | RESPIRATORY_TRACT | Status: AC
  Administered 2018-03-21: 2.5 mg via RESPIRATORY_TRACT

## 2018-03-21 NOTE — Progress Notes (Signed)
Remote pacemaker transmission.   

## 2018-03-21 NOTE — Progress Notes (Signed)
BP 130/87 (BP Location: Left Arm, Patient Position: Sitting, Cuff Size: Large)   Pulse 86   Temp 98.7 F (37.1 C) (Oral)   Ht 5\' 11"  (1.803 m)   Wt 280 lb 9 oz (127.3 kg)   SpO2 90%   BMI 39.13 kg/m    Subjective:    Patient ID: Nicholas [name], male    DOB: [birth year], 73 y.o.   MRN: [id number]  HPI: Nicholas [name] is a 73 y.o. male  Chief Complaint  Patient presents with  . Cough    Pt states he has had it for over a week. Gets really SOB  . Medication Refill   Here today with over a week of congestion, productive cough, significant fatigue, and SOB. Notes wheezing fairly often, and having to sleep upright in a chair. Taking tylenol and mucinex with no relief. Several sick contacts. No hx of asthma or COPD, non smoker.   Relevant past medical, surgical, family and social history reviewed and updated as indicated. Interim medical history since our last visit reviewed. Allergies and medications reviewed and updated.  Review of Systems  Per HPI unless specifically indicated above     Objective:    BP 130/87 (BP Location: Left Arm, Patient Position: Sitting, Cuff Size: Large)   Pulse 86   Temp 98.7 F (37.1 C) (Oral)   Ht 5\' 11"  (1.803 m)   Wt 280 lb 9 oz (127.3 kg)   SpO2 90%   BMI 39.13 kg/m   Wt Readings from Last 3 Encounters:  03/21/18 280 lb 9 oz (127.3 kg)  02/25/18 283 lb (128.4 kg)  01/05/18 283 lb (128.4 kg)    Physical Exam Vitals signs and nursing note reviewed.  Constitutional:      Appearance: He is well-developed.  HENT:     Head: Atraumatic.     Right Ear: External ear normal.     Left Ear: External ear normal.     Nose: Congestion present.     Mouth/Throat:     Pharynx: Posterior oropharyngeal erythema present. No oropharyngeal exudate.  Eyes:     Conjunctiva/sclera: Conjunctivae normal.     Pupils: Pupils are equal, round, and reactive to light.  Neck:     Musculoskeletal: Normal range of motion and neck supple.  Cardiovascular:     Rate  and Rhythm: Normal rate and regular rhythm.  Pulmonary:     Effort: Pulmonary effort is normal. No respiratory distress.     Breath sounds: Wheezing present. No rales.  Musculoskeletal: Normal range of motion.  Lymphadenopathy:     Cervical: No cervical adenopathy.  Skin:    General: Skin is warm and dry.  Neurological:     Mental Status: He is alert and oriented to person, place, and time.  Psychiatric:        Behavior: Behavior normal.     Results for orders placed or performed in visit on 03/13/18  CUP PACEART REMOTE DEVICE CHECK  Result Value Ref Range   Date Time Interrogation Session [id number]    Pulse Generator Manufacturer MERM    Pulse Gen Model A2DR01 Advisa DR MRI    Pulse Gen Serial Number [id number]    Clinic Name [facility]    Implantable Pulse Generator Type Implantable Pulse Generator    Implantable Pulse Generator Implant Date [id number]    Implantable Lead Manufacturer MERM    Implantable Lead Model 3830 SelectSecure MRI SureScan    Implantable Lead Serial Number [id number]  Implantable Lead Implant Date 89381017    Implantable Lead Location Detail 1 UNKNOWN    Implantable Lead Special Function Bundle of His    Implantable Lead Location [phone]    Implantable Lead Manufacturer MERM    Implantable Lead Model 5076 CapSureFix Novus MRI SureScan    Implantable Lead Serial Number [id number]    Implantable Lead Implant Date [id number]    Implantable Lead Location Detail 1 APPENDAGE    Implantable Lead Location [phone]    Lead Channel Setting Sensing Sensitivity 0.9 mV   Lead Channel Setting Pacing Amplitude 2 V   Lead Channel Setting Pacing Pulse Width 1 ms   Lead Channel Setting Pacing Amplitude 2 V   Lead Channel Impedance Value 475 ohm   Lead Channel Impedance Value 361 ohm   Lead Channel Sensing Intrinsic Amplitude 4.25 mV   Lead Channel Sensing Intrinsic Amplitude 4.25 mV   Lead Channel Pacing Threshold Amplitude 0.625 V   Lead Channel Pacing  Threshold Pulse Width 0.4 ms   Lead Channel Impedance Value 532 ohm   Lead Channel Impedance Value 342 ohm   Lead Channel Sensing Intrinsic Amplitude 1.625 mV   Lead Channel Sensing Intrinsic Amplitude 1.625 mV   Lead Channel Pacing Threshold Amplitude 0.75 V   Lead Channel Pacing Threshold Pulse Width 0.4 ms   Battery Status OK    Battery Remaining Longevity 78 mo   Battery Voltage 3.01 V   Brady Statistic RA Percent Paced 21.45 %   Brady Statistic RV Percent Paced 99.95 %   Brady Statistic AP VP Percent 21.48 %   Brady Statistic AS VP Percent 78.49 %   Brady Statistic AP VS Percent 0 %   Brady Statistic AS VS Percent 0.03 %      Assessment & Plan:   Problem List Items Addressed This Visit    None    Visit Diagnoses    Lower respiratory infection    -  Primary   mild improvement with neb tx in clinic. Await CXR to r/o pneumonia, start zpak, phenergan DM, tessalon perles. Continue mucinex and supportive care   Relevant Medications   azithromycin (ZITHROMAX) 250 MG tablet   SOB (shortness of breath)       Relevant Medications   albuterol (PROVENTIL) (2.5 MG/3ML) 0.083% nebulizer solution 2.5 mg (Completed)   Other Relevant Orders   DG Chest 2 View (Completed)       Follow up plan: Return if symptoms worsen or fail to improve.

## 2018-03-21 NOTE — Telephone Encounter (Signed)
Please call and let him know to take delsym or robitussin at bedtime since the phenergan DM is on back order

## 2018-03-21 NOTE — Telephone Encounter (Signed)
Pharmacy message Rx not available- requesting change of Rx written today

## 2018-03-21 NOTE — Telephone Encounter (Signed)
Message relayed to patient. Verbalized understanding and denied questions.   

## 2018-03-21 NOTE — Telephone Encounter (Signed)
Please approve or deny medication so encounter can be closed. Thank you.

## 2018-03-22 ENCOUNTER — Encounter: Payer: Self-pay | Admitting: Cardiology

## 2018-03-22 ENCOUNTER — Telehealth: Payer: Self-pay | Admitting: Family Medicine

## 2018-03-22 NOTE — Telephone Encounter (Signed)
Copied from [place] [id number]. Topic: Quick Communication - See Telephone Encounter >> Mar 22, 2018  1:57 PM Vernona Rieger wrote: CRM for notification. See Telephone encounter for: 03/22/18.  Patient is requesting xray results from yesterday

## 2018-03-22 NOTE — Telephone Encounter (Signed)
Please let him know the x-ray was normal - also sent via mychart

## 2018-03-25 ENCOUNTER — Telehealth: Payer: Self-pay | Admitting: Family Medicine

## 2018-03-25 ENCOUNTER — Other Ambulatory Visit: Payer: Self-pay | Admitting: Family Medicine

## 2018-03-25 MED ORDER — PREDNISONE 20 MG PO TABS: 40.0000 mg | ORAL_TABLET | Freq: Every day | ORAL | 0 refills | Status: DC

## 2018-03-25 MED ORDER — DOXYCYCLINE HYCLATE 100 MG PO TABS: 100.0000 mg | ORAL_TABLET | Freq: Two times a day (BID) | ORAL | 0 refills | Status: DC

## 2018-03-25 NOTE — Telephone Encounter (Signed)
Message relayed to patient. Verbalized understanding and denied questions.   

## 2018-03-25 NOTE — Telephone Encounter (Signed)
Copied from [place] [phone] [id number]. Topic: General - Other >> Mar 25, 2018  9:33 AM Percell Belt A wrote: Reason for CRM:  Pt called in and stated he was seen on Thursday for his cough and wheezing.  He stated that the wheezing has gotten better but his cough is no better and the meds are not helping .  Please advise   Ph[facility]acy [id number] [place], [address] [phone] (Phone)  Best number  [phone]

## 2018-03-25 NOTE — Telephone Encounter (Signed)
See other telephone encounter from this AM

## 2018-03-25 NOTE — Telephone Encounter (Signed)
Copied from [place] [phone]. Topic: General - Other >> Mar 25, 2018  9:33 AM Percell Belt A wrote: Reason for CRM:  Pt called in and stated he was seen on Thursday for his cough and wheezing.  He stated that the wheezing has gotten better but his cough is no better and the meds are not helping .  Please advise   Ph[facility]acy [id number] [place], [address] [phone] (Phone)  Best number  [phone]

## 2018-03-25 NOTE — Telephone Encounter (Signed)
Will send over a different antibiotic and some prednisone, he should follow up right away if worsening or not improving to be re-evaluated

## 2018-03-26 DIAGNOSIS — M19041 Primary osteoarthritis, right hand: Secondary | ICD-10-CM | POA: Diagnosis not present

## 2018-03-26 DIAGNOSIS — M13842 Other specified arthritis, left hand: Secondary | ICD-10-CM | POA: Diagnosis not present

## 2018-04-03 ENCOUNTER — Ambulatory Visit: Payer: Medicare Other | Admitting: Family Medicine

## 2018-04-09 ENCOUNTER — Encounter: Payer: Medicare Other | Admitting: Internal Medicine

## 2018-04-16 ENCOUNTER — Telehealth: Payer: Self-pay | Admitting: Family Medicine

## 2018-04-16 ENCOUNTER — Telehealth: Payer: Self-pay | Admitting: Internal Medicine

## 2018-04-16 NOTE — Telephone Encounter (Signed)
Called patient to ask permission if the Nurse Health Advisor can do his AWV on 04/18/2018 at 8:00 AM, by telephone.  Patient wasn't there, but I spoke with his wife and she took a message to have him call Lattie Haw at [phone].  If patient calls, please ask his permission to do AWV telephonically and ask patient which phone number to call for the visit.  Please add the authorization and phone number in the appointment notes.  Thank you, Janace Hoard, Care Guide [phone].

## 2018-04-16 NOTE — Telephone Encounter (Signed)
Spoke with patient by phone and he is giving permission for the [facility] to do the AWV telephonically on Thursday, 04/18/2018 at 8:00 AM.  Patient has given me phone number [phone] to call for the AWV.  Janace Hoard, Care Guide.

## 2018-04-18 ENCOUNTER — Ambulatory Visit (INDEPENDENT_AMBULATORY_CARE_PROVIDER_SITE_OTHER): Payer: Medicare Other

## 2018-04-18 ENCOUNTER — Telehealth: Payer: Self-pay

## 2018-04-18 ENCOUNTER — Other Ambulatory Visit: Payer: Self-pay

## 2018-04-18 ENCOUNTER — Telehealth: Payer: Self-pay | Admitting: Family Medicine

## 2018-04-18 VITALS — BP 114/74 | Ht 71.0 in | Wt 280.0 lb

## 2018-04-18 DIAGNOSIS — Z0000 Encounter for general adult medical examination without abnormal findings: Secondary | ICD-10-CM

## 2018-04-18 NOTE — Patient Instructions (Signed)
[name] , Thank you for taking time to come for your Medicare Wellness Visit. I appreciate your ongoing commitment to your health goals. Please review the following plan we discussed and let me know if I can assist you in the future.   Screening recommendations/referrals: Colonoscopy: completed 12/07/2015 Recommended yearly ophthalmology/optometry visit for glaucoma screening and checkup Recommended yearly dental visit for hygiene and checkup  Vaccinations: Influenza vaccine: up to date  Pneumococcal vaccine: up to date  Tdap vaccine: up to date  Shingles vaccine: shingrix eligible, check with your insurance company for coverage     Advanced directives: Advance directive discussed with you today.  Conditions/risks identified: none  Next appointment: Follow up for your annual wellness visit in one year.   Preventive Care 70 Years and Older, Male Preventive care refers to lifestyle choices and visits with your health care provider that can promote health and wellness. What does preventive care include?  A yearly physical exam. This is also called an annual well check.  Dental exams once or twice a year.  Routine eye exams. Ask your health care provider how often you should have your eyes checked.  Personal lifestyle choices, including:  Daily care of your teeth and gums.  Regular physical activity.  Eating a healthy diet.  Avoiding tobacco and drug use.  Limiting alcohol use.  Practicing safe sex.  Taking low doses of aspirin every day.  Taking vitamin and mineral supplements as recommended by your health care provider. What happens during an annual well check? The services and screenings done by your health care provider during your annual well check will depend on your age, overall health, lifestyle risk factors, and family history of disease. Counseling  Your health care provider may ask you questions about your:  Alcohol use.  Tobacco use.  Drug use.   Emotional well-being.  Home and relationship well-being.  Sexual activity.  Eating habits.  History of falls.  Memory and ability to understand (cognition).  Work and work Statistician. Screening  You may have the following tests or measurements:  Height, weight, and BMI.  Blood pressure.  Lipid and cholesterol levels. These may be checked every 5 years, or more frequently if you are over 14 years old.  Skin check.  Lung cancer screening. You may have this screening every year starting at age 16 if you have a 30-pack-year history of smoking and currently smoke or have quit within the past 15 years.  Fecal occult blood test (FOBT) of the stool. You may have this test every year starting at age 50.  Flexible sigmoidoscopy or colonoscopy. You may have a sigmoidoscopy every 5 years or a colonoscopy every 10 years starting at age 49.  Prostate cancer screening. Recommendations will vary depending on your family history and other risks.  Hepatitis C blood test.  Hepatitis B blood test.  Sexually transmitted disease (STD) testing.  Diabetes screening. This is done by checking your blood sugar (glucose) after you have not eaten for a while (fasting). You may have this done every 1-3 years.  Abdominal aortic aneurysm (AAA) screening. You may need this if you are a current or former smoker.  Osteoporosis. You may be screened starting at age 17 if you are at high risk. Talk with your health care provider about your test results, treatment options, and if necessary, the need for more tests. Vaccines  Your health care provider may recommend certain vaccines, such as:  Influenza vaccine. This is recommended every year.  Tetanus,  diphtheria, and acellular pertussis (Tdap, Td) vaccine. You may need a Td booster every 10 years.  Zoster vaccine. You may need this after age 32.  Pneumococcal 13-valent conjugate (PCV13) vaccine. One dose is recommended after age 12.  Pneumococcal  polysaccharide (PPSV23) vaccine. One dose is recommended after age 45. Talk to your health care provider about which screenings and vaccines you need and how often you need them. This information is not intended to replace advice given to you by your health care provider. Make sure you discuss any questions you have with your health care provider. Document Released: 02/05/2015 Document Revised: 09/29/2015 Document Reviewed: 11/10/2014 Elsevier Interactive Patient Education  2017 [place] Prevention in the Home Falls can cause injuries. They can happen to people of all ages. There are many things you can do to make your home safe and to help prevent falls. What can I do on the outside of my home?  Regularly fix the edges of walkways and driveways and fix any cracks.  Remove anything that might make you trip as you walk through a door, such as a raised step or threshold.  Trim any bushes or trees on the path to your home.  Use bright outdoor lighting.  Clear any walking paths of anything that might make someone trip, such as rocks or tools.  Regularly check to see if handrails are loose or broken. Make sure that both sides of any steps have handrails.  Any raised decks and porches should have guardrails on the edges.  Have any leaves, snow, or ice cleared regularly.  Use sand or salt on walking paths during winter.  Clean up any spills in your garage right away. This includes oil or grease spills. What can I do in the bathroom?  Use night lights.  Install grab bars by the toilet and in the tub and shower. Do not use towel bars as grab bars.  Use non-skid mats or decals in the tub or shower.  If you need to sit down in the shower, use a plastic, non-slip stool.  Keep the floor dry. Clean up any water that spills on the floor as soon as it happens.  Remove soap buildup in the tub or shower regularly.  Attach bath mats securely with double-sided non-slip rug tape.   Do not have throw rugs and other things on the floor that can make you trip. What can I do in the bedroom?  Use night lights.  Make sure that you have a light by your bed that is easy to reach.  Do not use any sheets or blankets that are too big for your bed. They should not hang down onto the floor.  Have a firm chair that has side arms. You can use this for support while you get dressed.  Do not have throw rugs and other things on the floor that can make you trip. What can I do in the kitchen?  Clean up any spills right away.  Avoid walking on wet floors.  Keep items that you use a lot in easy-to-reach places.  If you need to reach something above you, use a strong step stool that has a grab bar.  Keep electrical cords out of the way.  Do not use floor polish or wax that makes floors slippery. If you must use wax, use non-skid floor wax.  Do not have throw rugs and other things on the floor that can make you trip. What can I do with  my stairs?  Do not leave any items on the stairs.  Make sure that there are handrails on both sides of the stairs and use them. Fix handrails that are broken or loose. Make sure that handrails are as long as the stairways.  Check any carpeting to make sure that it is firmly attached to the stairs. Fix any carpet that is loose or worn.  Avoid having throw rugs at the top or bottom of the stairs. If you do have throw rugs, attach them to the floor with carpet tape.  Make sure that you have a light switch at the top of the stairs and the bottom of the stairs. If you do not have them, ask someone to add them for you. What else can I do to help prevent falls?  Wear shoes that:  Do not have high heels.  Have rubber bottoms.  Are comfortable and fit you well.  Are closed at the toe. Do not wear sandals.  If you use a stepladder:  Make sure that it is fully opened. Do not climb a closed stepladder.  Make sure that both sides of the  stepladder are locked into place.  Ask someone to hold it for you, if possible.  Clearly mark and make sure that you can see:  Any grab bars or handrails.  First and last steps.  Where the edge of each step is.  Use tools that help you move around (mobility aids) if they are needed. These include:  Canes.  Walkers.  Scooters.  Crutches.  Turn on the lights when you go into a dark area. Replace any light bulbs as soon as they burn out.  Set up your furniture so you have a clear path. Avoid moving your furniture around.  If any of your floors are uneven, fix them.  If there are any pets around you, be aware of where they are.  Review your medicines with your doctor. Some medicines can make you feel dizzy. This can increase your chance of falling. Ask your doctor what other things that you can do to help prevent falls. This information is not intended to replace advice given to you by your health care provider. Make sure you discuss any questions you have with your health care provider. Document Released: 11/05/2008 Document Revised: 06/17/2015 Document Reviewed: 02/13/2014 Elsevier Interactive Patient Education  2017 [employer].

## 2018-04-18 NOTE — Telephone Encounter (Signed)
Patient left a message on my [employer] number voicemail [phone]) and said he needed to talk to me about the blood pressure.  Patient states he couldn't reach the office by the contact number he had [phone]).  Patient told me he took his blood pressure today, 04/18/2018 around 9:00 AM and it was BP sitting (148/76), and BP standing (114/74).  I sent an email to [facility], [facility] giving her the patient's message and BP readings.  Per [facility], she authorized I could give her this readings.  Janace Hoard, Care Guide.

## 2018-04-18 NOTE — Telephone Encounter (Signed)
Called patient back, confirmed BP readings he took at home. He mentioned during his AWV that he has mentioned to Dr.Klein he gets dizzy when standing sometimes. Advised patient to check when sitting and then again when standing. Patient called back with BP readings of 148/76 when sitting and upon standing it dropped to 114/74. Declined any dizziness when he stood up this time. Will forward to [place], scheduled to come back to office for cpe in June.

## 2018-04-18 NOTE — Progress Notes (Signed)
Subjective:   Nicholas [name] is a 73 y.o. male who presents for Medicare Annual/Subsequent preventive examination.  This visit is being conducted via telephone due to the COVID-19 pandemic. This patient has given me verbal consent via telephone to conduct this visit. Some vital signs may be absent or patient reported.  Patient identification: identified by name, DOB, and current address.    Review of Systems:  Cardiac Risk Factors include: advanced age (>97men, >26 women);obesity (BMI >30kg/m2);male gender;hypertension;dyslipidemia     Objective:    Vitals: Ht 5\' 11"  (1.803 m) Comment: patient reported  Wt 280 lb (127 kg) Comment: patient reported  BMI 39.05 kg/m   Body mass index is 39.05 kg/m.  Advanced Directives 04/18/2018 02/25/2018 01/05/2018 06/22/2017 06/11/2017 05/17/2017 04/11/2017  Does Patient Have a Medical Advance Directive? No No No No No No No  Type of Advance Directive - - - - - - -  Would patient like information on creating a medical advance directive? (No Data) - - No - Patient declined No - Patient declined - Yes (MAU/Ambulatory/Procedural Areas - Information given)  Pre-existing out of facility DNR order (yellow form or pink MOST form) - - - - - - -    Tobacco Social History   Tobacco Use  Smoking Status Never Smoker  Smokeless Tobacco Never Used     Counseling given: Not Answered   Clinical Intake:  Pre-visit preparation completed: Yes  Pain : 0-10 Pain Score: 6  Pain Type: Chronic pain Pain Location: Hand Pain Orientation: Left, Right Pain Descriptors / Indicators: Aching Pain Onset: More than a month ago Pain Frequency: Constant Pain Relieving Factors: exercising and cream 2-3 times a day   Pain Relieving Factors: exercising and cream 2-3 times a day   Nutritional Risks: None Diabetes: No  How often do you need to have someone help you when you read instructions, pamphlets, or other written materials from your doctor or pharmacy?: 1 - Never  What is the last grade level you completed in school?: high school   Interpreter Needed?: No  Information entered by ::  ,LPN   Past Medical History:  Diagnosis Date  . Anterior urethral stricture   . Anxiety   . Arthritis    a. knees, hips, hands;  b. 11/2013 s/p L TKA @ [place].  . Bile reflux gastritis   . Bulging lumbar disc   . BXO (balanitis xerotica obliterans)   . 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 Depression   . DVT (deep venous thrombosis) ([place])   . Erosive esophagitis   . Gross hematuria   . Hyperlipemia   . Hypertension    borderline  . Internal hemorrhoids   . Phimosis   . Pulmonary embolism [facility])    Past Surgical History:  Procedure Laterality Date  . BUNIONECTOMY Bilateral 01/06/2015   Procedure: BUNIONECTOMY;  Surgeon: [name], MD;  Location: [facility] ORS;  Service: Orthopedics;  Laterality: Bilateral;  . CARDIAC CATHETERIZATION  ~ 2005   "once"  . CATARACT EXTRACTION W/ INTRAOCULAR LENS  IMPLANT, BILATERAL Bilateral ~ 2010  . COLONOSCOPY WITH PROPOFOL N/A 12/07/2015   Procedure: COLONOSCOPY WITH PROPOFOL;  Surgeon: Lollie Sails, MD;  Location: [facility] ENDOSCOPY;  Service: Endoscopy;  Laterality: N/A;  . EP IMPLANTABLE DEVICE N/A 02/16/2016   MDT dual chamber (His Bundle) pacemaker implanted by Dr Caryl Comes for intermittent complete heart block  . ESOPHAGOGASTRODUODENOSCOPY (EGD) WITH PROPOFOL N/A  12/07/2015   Procedure: ESOPHAGOGASTRODUODENOSCOPY (EGD) WITH PROPOFOL;  Surgeon: Lollie Sails, MD;  Location: [facility] ENDOSCOPY;  Service: Endoscopy;  Laterality: N/A;  . ESOPHAGOGASTRODUODENOSCOPY (EGD) WITH PROPOFOL N/A 05/17/2017   Procedure: ESOPHAGOGASTRODUODENOSCOPY (EGD) WITH PROPOFOL;  Surgeon: Lollie Sails, MD;  Location: [facility] ENDOSCOPY;  Service: Endoscopy;  Laterality: N/A;  . HAMMER TOE SURGERY Bilateral 01/06/2015   Procedure: HAMMER TOE CORRECTION;  Surgeon: [name],  MD;  Location: [facility] ORS;  Service: Orthopedics;  Laterality: Bilateral;  . KNEE CARTILAGE SURGERY Left 1965   "football injury"  . Left Total Knee Arthroplasty     a. 11/2013 [facility].  Marland Kitchen PILONIDAL CYST EXCISION  1970's  . TOTAL HIP ARTHROPLASTY Right 2004  . TOTAL HIP ARTHROPLASTY Left 2006  . UPPER GI ENDOSCOPY     Family History  Problem Relation Age of Onset  . Stroke Father        deceased  . Diabetes Father   . Hypertension Father   . Alcoholism Mother        died in her 15's.  . Cancer Mother   . Glaucoma Sister   . Breast cancer Sister   . Prostate cancer Paternal Grandfather    Social History   Socioeconomic History  . Marital status: Married    Spouse name: Not on file  . Number of children: Not on file  . Years of education: Not on file  . Highest education level: High school graduate  Occupational History  . Occupation: retired   Scientific laboratory technician  . Financial resource strain: Not hard at all  . Food insecurity:    Worry: Never true    Inability: Never true  . Transportation needs:    Medical: No    Non-medical: No  Tobacco Use  . Smoking status: Never Smoker  . Smokeless tobacco: Never Used  Substance and Sexual Activity  . Alcohol use: Yes    Alcohol/week: 0.0 standard drinks    Comment: 2 beers a month sometimes  . Drug use: No  . Sexual activity: Not Currently  Lifestyle  . Physical activity:    Days per week: 0 days    Minutes per session: 0 min  . Stress: Not at all  Relationships  . Social connections:    Talks on phone: More than three times a week    Gets together: More than three times a week    Attends religious service: More than 4 times per year    Active member of club or organization: Yes    Attends meetings of clubs or organizations: More than 4 times per year    Relationship status: Married  Other Topics Concern  . Not on file  Social History Narrative   Lives in [place] with wife.  Does not routinely exercise.  Activity severely  limited by bilateral knee pain.    Outpatient Encounter Medications as of 04/18/2018  Medication Sig  . acetaminophen (TYLENOL) 500 MG tablet Take 2,000 mg by mouth daily as needed for moderate pain or headache.  Marland Kitchen apixaban (ELIQUIS) 5 MG TABS tablet Take 1 tablet (5 mg total) by mouth 2 (two) times daily.  Marland Kitchen ascorbic acid (VITAMIN C) 250 MG tablet Take 250 mg by mouth daily.   . Cranberry 500 MG CAPS Take 1 capsule by mouth daily.   . Cyanocobalamin (B-12) 1000 MCG CAPS Take 1,000 mcg by mouth daily.   . cyclobenzaprine (FLEXERIL) 10 MG tablet Take 10 mg by mouth 3 (three) times daily as needed  for muscle spasms.  . diclofenac sodium (VOLTAREN) 1 % GEL diclofenac 1 % topical gel  APPLY 2 GRAMS TO AFFECTED AREA 4 TIMES A DAY  . ferrous sulfate 325 (65 FE) MG tablet Take 325 mg by mouth daily with breakfast.  . flecainide (TAMBOCOR) 100 MG tablet TAKE 1 TABLET BY MOUTH TWICE A DAY  . gabapentin (NEURONTIN) 400 MG capsule Take 1 capsule (400 mg total) by mouth daily.  Marland Kitchen HYDROcodone-acetaminophen (NORCO/VICODIN) 5-325 MG tablet Take 1 tablet by mouth every 6 (six) hours as needed for up to 30 days for moderate pain or severe pain.  . hyoscyamine (LEVSIN, ANASPAZ) 0.125 MG tablet Take 0.125 mg by mouth every 6 (six) hours as needed for bladder spasms or cramping.   Marland Kitchen LORazepam (ATIVAN) 1 MG tablet Take 1 tablet (1 mg total) by mouth at bedtime.  . Magnesium 400 MG TABS Take 400 mg by mouth daily.   . Melatonin 5 MG TABS Take 10 mg by mouth at bedtime.  . Multiple Vitamin (MULTI-VITAMINS) TABS Take 1 tablet by mouth daily.   . naproxen sodium (ALEVE) 220 MG tablet Take 220 mg by mouth.  . Omega-3 Fatty Acids (FISH OIL) 1200 MG CAPS Take 1,200 mg by mouth daily.  Marland Kitchen oxymetazoline (AFRIN) 0.05 % nasal spray Place 2 sprays into both nostrils at bedtime as needed for congestion.  . pantoprazole (PROTONIX) 40 MG tablet TAKE 1 TABLET (40 MG TOTAL) BY MOUTH ONCE DAILY TAKE 30 MINS BEFORE MEAL  .  QUEtiapine Fumarate (SEROQUEL XR) 150 MG 24 hr tablet Take 1 tablet (150 mg total) by mouth at bedtime.  . simvastatin (ZOCOR) 20 MG tablet Take 1 tablet (20 mg total) by mouth daily.  . solifenacin (VESICARE) 10 MG tablet Take 1 tablet (10 mg total) by mouth daily.  Marland Kitchen venlafaxine XR (EFFEXOR XR) 75 MG 24 hr capsule Take 3 capsules (225 mg total) by mouth daily.  . [DISCONTINUED] azithromycin (ZITHROMAX) 250 MG tablet Take 2 tabs day one, then 1 tab daily until complete (Patient not taking: Reported on 04/18/2018)  . [DISCONTINUED] b complex vitamins capsule Take 1 capsule by mouth daily.  . [DISCONTINUED] benzonatate (TESSALON) 200 MG capsule Take 1 capsule (200 mg total) by mouth 3 (three) times daily as needed. (Patient not taking: Reported on 04/18/2018)  . [DISCONTINUED] doxycycline (VIBRA-TABS) 100 MG tablet Take 1 tablet (100 mg total) by mouth 2 (two) times daily. (Patient not taking: Reported on 04/18/2018)  . [DISCONTINUED] predniSONE (DELTASONE) 20 MG tablet Take 2 tablets (40 mg total) by mouth daily with breakfast. (Patient not taking: Reported on 04/18/2018)  . [DISCONTINUED] promethazine-dextromethorphan (PROMETHAZINE-DM) 6.25-15 MG/5ML syrup Take 2.5 mLs by mouth 4 (four) times daily as needed for cough. (Patient not taking: Reported on 04/18/2018)   No facility-administered encounter medications on file as of 04/18/2018.     Activities of Daily Living In your present state of health, do you have any difficulty performing the following activities: 04/18/2018 07/02/2017  Hearing? N N  Comment tinnitus  -  Vision? N N  Difficulty concentrating or making decisions? Y N  Comment most of the time it comes back  -  Walking or climbing stairs? Y N  Comment hip pain,takes one step at a time.  -  Dressing or bathing? N N  Doing errands, shopping? N N  Preparing Food and eating ? N -  Using the Toilet? N -  In the past six months, have you accidently leaked urine? N -  Do you have problems  with loss of bowel control? N -  Managing your Medications? N -  Managing your Finances? N -  Housekeeping or managing your Housekeeping? N -  Some recent data might be hidden    Patient Care Team: [name], MD as PCP - General (Family Medicine) [name], MD (Specialist) Hollice Espy, MD as Consulting Physician (Urology) Tanda Rockers, MD as Consulting Physician (Pulmonary Disease) [name], MD as Consulting Physician (Anesthesiology) Deboraha Sprang, MD as Consulting Physician (Cardiology)   Assessment:   This is a routine wellness examination for [facility].  Exercise Activities and Dietary recommendations Current Exercise Habits: The patient does not participate in regular exercise at present, Exercise limited by: None identified  Goals    . DIET - INCREASE WATER INTAKE     recommend drinking at least 6-8 glasses of water a day        Fall Risk: Fall Risk  04/18/2018 02/25/2018 01/03/2018 12/24/2017 10/30/2017  Falls in the past year? 1 0 1 0 Yes  Number falls in past yr: 0 0 1 - 1  Comment - - - - -  Injury with Fall? 1 0 1 - Yes  Comment hit head, visited ED - - - tripped over a rope. LLE bruising, scabbing  Risk Factor Category  - - - - -  Risk for fall due to : - - History of fall(s) - Impaired balance/gait  Follow up - - Falls evaluation completed - Falls prevention discussed  Comment - - - - -    FALL RISK PREVENTION PERTAINING TO THE HOME:  Any stairs in or around the home? Yes  If so, are there any without handrails? No   Home free of loose throw rugs in walkways, pet beds, electrical cords, etc? No  Adequate lighting in your home to reduce risk of falls? Yes   ASSISTIVE DEVICES UTILIZED TO PREVENT FALLS:  Life alert? No  Use of a cane, walker or w/c? No  Grab bars in the bathroom? Yes  Shower chair or bench in shower? No  Elevated toilet seat or a handicapped toilet? No   TIMED UP AND GO:  Was the test performed? No .  Unable to  perform.   Depression Screen PHQ 2/9 Scores 04/18/2018 02/25/2018 12/24/2017 10/30/2017  PHQ - 2 Score 0 0 0 0  PHQ- 9 Score - - - -  Exception Documentation - - - -    Cognitive Function     6CIT Screen 04/18/2018 04/11/2017  What Year? 0 points 0 points  What month? 0 points 0 points  What time? 0 points 0 points  Count back from 20 0 points 0 points  Months in reverse 0 points 0 points  Repeat phrase 0 points 0 points  Total Score 0 0    Immunization History  Administered Date(s) Administered  . Influenza Split 11/13/2013  . Influenza Whole 11/09/2015  . Influenza, High Dose Seasonal PF 11/16/2015, 11/14/2016, 09/27/2017  . Influenza,inj,Quad PF,6+ Mos 10/20/2014  . Influenza-Unspecified 09/23/2012, 03/21/2018  . Pneumococcal Conjugate-13 04/28/2014  . Pneumococcal Polysaccharide-23 04/10/2013  . Pneumococcal-Unspecified 10/01/2008  . Td 01/05/2005  . Tdap 06/11/2017    Qualifies for Shingles Vaccine? Yes  Zostavax completed n/a. Due for Shingrix. Education has been provided regarding the importance of this vaccine. Pt has been advised to call insurance company to determine out of pocket expense. Advised may also receive vaccine at local pharmacy or Health Dept. Verbalized acceptance and  understanding.   Tdap: up to date   Flu Vaccine: up to date   Pneumococcal Vaccine: up to date   Screening Tests Health Maintenance  Topic Date Due  . COLONOSCOPY  12/06/2025  . TETANUS/TDAP  06/12/2027  . INFLUENZA VACCINE  Completed  . Hepatitis C Screening  Completed  . PNA vac Low Risk Adult  Completed   Cancer Screenings:  Colorectal Screening: Completed 12/07/2015. Repeat every 10 years  Lung Cancer Screening: (Low Dose CT Chest recommended if Age 70-80 years, 30 pack-year currently smoking OR have quit w/in 15years.) does not qualify.     Additional Screening:  Hepatitis C Screening: does qualify; Completed 05/01/2015  Vision Screening: Recommended annual  ophthalmology exams for early detection of glaucoma and other disorders of the eye. Is the patient up to date with their annual eye exam?  Yes  Who is the provider or what is the name of the office in which the pt attends annual eye exams? [facility]   Dental Screening: Recommended annual dental exams for proper oral hygiene  Community Resource Referral:  CRR required this visit?  No        Plan:  I have personally reviewed and addressed the Medicare Annual Wellness questionnaire and have noted the following in the patient's chart:  A. Medical and social history B. Use of alcohol, tobacco or illicit drugs  C. Current medications and supplements D. Functional ability and status E.  Nutritional status F.  Physical activity G. Advance directives H. List of other physicians I.  Hospitalizations, surgeries, and ER visits in previous 12 months J.  [place] such as hearing and vision if needed, cognitive and depression L. Referrals and appointments   In addition, I have reviewed and discussed with patient certain preventive protocols, quality metrics, and best practice recommendations.  Signed,   Bevelyn Ngo, LPN  [id number]/[id number] Nurse Health Advisor   Nurse Notes: none

## 2018-04-22 ENCOUNTER — Telehealth: Payer: Self-pay | Admitting: Internal Medicine

## 2018-04-22 NOTE — Telephone Encounter (Signed)
I called and spoke with the patient and his wife regarding switching him over to a phone visit/ video visit with Dr. Caryl Comes tomorrow. He is scheduled for an in office visit at 11:00 am tomorrow and is aware we will need to do this virtually or push an actual office visit out to no sooner than 4 weeks.   Per the patient and his wife, they prefer to cancel the appointment for tomorrow with Dr. Caryl Comes and r/s in no sooner than 4 weeks out.   They are aware we will call back in several weeks to arrange an in office visit.     Primary Cardiologist:  No primary care provider on file.   Patient contacted.  History reviewed.  No symptoms to suggest any unstable cardiac conditions.  Based on discussion, with current pandemic situation, we will be postponing this appointment for [name] with a plan for f/u in 4-6 wks or sooner if feasible/necessary.  If symptoms change, he has been instructed to contact our office.   Routing to C19 CANCEL pool for tracking (P CV DIV CV19 CANCEL - reason for visit "other.") and assigning priority (1 = 4-6 wks, 2 = 6-12 wks, 3 = >12 wks).   Alvis Lemmings, RN  04/22/2018 2:31 PM         .

## 2018-04-23 ENCOUNTER — Encounter: Payer: Medicare Other | Admitting: Internal Medicine

## 2018-04-26 NOTE — Telephone Encounter (Signed)
Not seen

## 2018-05-02 NOTE — Telephone Encounter (Signed)
RECALL PLACED FOR 08/24/2018 

## 2018-05-06 ENCOUNTER — Ambulatory Visit: Payer: Medicare Other | Attending: Anesthesiology | Admitting: Anesthesiology

## 2018-05-06 ENCOUNTER — Telehealth: Payer: Medicare Other | Admitting: Physician Assistant

## 2018-05-06 ENCOUNTER — Other Ambulatory Visit: Payer: Self-pay

## 2018-05-06 DIAGNOSIS — M5431 Sciatica, right side: Secondary | ICD-10-CM

## 2018-05-06 DIAGNOSIS — G894 Chronic pain syndrome: Secondary | ICD-10-CM

## 2018-05-06 DIAGNOSIS — M5442 Lumbago with sciatica, left side: Secondary | ICD-10-CM

## 2018-05-06 DIAGNOSIS — M170 Bilateral primary osteoarthritis of knee: Secondary | ICD-10-CM

## 2018-05-06 DIAGNOSIS — F1190 Opioid use, unspecified, uncomplicated: Secondary | ICD-10-CM

## 2018-05-06 DIAGNOSIS — G8929 Other chronic pain: Secondary | ICD-10-CM

## 2018-05-06 DIAGNOSIS — M79642 Pain in left hand: Secondary | ICD-10-CM

## 2018-05-06 DIAGNOSIS — M79641 Pain in right hand: Secondary | ICD-10-CM

## 2018-05-06 DIAGNOSIS — M1611 Unilateral primary osteoarthritis, right hip: Secondary | ICD-10-CM

## 2018-05-06 DIAGNOSIS — M545 Low back pain, unspecified: Secondary | ICD-10-CM

## 2018-05-06 DIAGNOSIS — M47817 Spondylosis without myelopathy or radiculopathy, lumbosacral region: Secondary | ICD-10-CM

## 2018-05-06 DIAGNOSIS — M5136 Other intervertebral disc degeneration, lumbar region: Secondary | ICD-10-CM

## 2018-05-06 DIAGNOSIS — M5441 Lumbago with sciatica, right side: Secondary | ICD-10-CM

## 2018-05-06 DIAGNOSIS — M5432 Sciatica, left side: Secondary | ICD-10-CM

## 2018-05-06 MED ORDER — HYDROCODONE-ACETAMINOPHEN 5-325 MG PO TABS: 1.0000 | ORAL_TABLET | Freq: Two times a day (BID) | ORAL | 0 refills | Status: DC

## 2018-05-06 MED ORDER — HYDROCODONE-ACETAMINOPHEN 5-325 MG PO TABS: 1.0000 | ORAL_TABLET | Freq: Two times a day (BID) | ORAL | 0 refills | Status: AC

## 2018-05-06 NOTE — Progress Notes (Signed)
Virtual Visit via Telephone Note  I connected with Nicholas [name] on 05/06/18 at  3:15 PM EDT by telephone and verified that I am speaking with the correct person using two identifiers.   I discussed the limitations, risks, security and privacy concerns of performing an evaluation and management service by telephone and the availability of in person appointments. I also discussed with the patient that there may be a patient responsible charge related to this service. The patient expressed understanding and agreed to proceed.   History of Present Illness: In phone conversation with Nicholas [name], he is doing well with his medication regimen.  The quality characteristic and distribution of his low back pain is been stable as well.  Since his last visit no significant changes in his overall health are noted and his lower extremity strength and function remain at baseline.  Otherwise he is tolerating his medications without difficulty and based on the narcotic assessment sheet findings from the past, no significant changes are noted.    Observations/Objective:   Assessment and Plan: 1. Chronic pain syndrome   2. Bilateral sciatica   3. Osteoarthritis of right hip, unspecified osteoarthritis type   4. Facet arthritis of lumbosacral region   5. Bilateral hand pain   6. Chronic, continuous use of opioids   7. Primary osteoarthritis of both knees   8. DDD (degenerative disc disease), lumbar   9. Chronic bilateral low back pain with bilateral sciatica   Based on the above we will schedule him for return to clinic in 2 months following this telephone visit.  He seems to be doing well with his current regimen and refills will be called in for April 13 and May 13 with return to clinic following this viral crisis in 2 months.  He is instructed to contact us in the meantime if he should have any difficulties with his pain management or medication management.   Follow Up Instructions:    I discussed the  assessment and treatment plan with the patient. The patient was provided an opportunity to ask questions and all were answered. The patient agreed with the plan and demonstrated an understanding of the instructions.   The patient was advised to call back or seek an in-person evaluation if the symptoms worsen or if the condition fails to improve as anticipated.  I provided 20 minutes of non-face-to-face time during this encounter.   [name], MD

## 2018-05-06 NOTE — Progress Notes (Signed)
Based on what you shared with me, I feel your condition warrants further evaluation and I recommend that you be seen for a face to face office visit,  or follow-up with your pain management specialist.    NOTE: If you entered your credit card information for this eVisit, you will not be charged. You may see a "hold" on your card for the $35 but that hold will drop off and you will not have a charge processed.  If you are having a true medical emergency please call 911.  If you need an urgent face to face visit, [facility] has four urgent care centers for your convenience.    PLEASE NOTE: THE [facility] LOCATIONS AND URGENT CARE CLINICS DO NOT HAVE THE TESTING FOR CORONAVIRUS COVID19 AVAILABLE.  IF YOU FEEL YOU NEED THIS TEST YOU MUST GO TO A TRIAGE LOCATION AT [facility]   [email or web address] to reserve your spot online an avoid wait times  [facility] [address], [address] [place], [place] [address] Modified hours of operation: Monday-Friday, 10 AM to 6 PM  Saturday & Sunday 10 AM to 4 PM *Across the street from [place] (New Address!) [address], [place], [place] [address] *Just off [address], across the road from [place] hours of operation: Monday-Friday, 10 AM to 5 PM  Closed Saturday & Sunday   The following sites will take your insurance:  . [facility]gent [place] a Provider at this Location  [address] [place], [place] [address] . 10 am to 8 pm Monday-Friday . 12 pm to 8 pm Saturday-Sunday   . [facility] [place] a Provider at this Location  [place], [place], [place] [address] . 8 am to 8 pm Monday-Friday . 9 am to 6 pm Saturday . 11 am to 6 pm Sunday   . [facility] Urgent Care at [place] Get Driving Directions  [address] Suite [place], [place] [address] . 8 am to 8 pm Monday-Friday . 8 am to 4 pm Saturday-Sunday   Your e-visit answers were reviewed by a board certified advanced clinical practitioner to complete your personal care plan.  Thank you for using e-Visits.

## 2018-05-13 ENCOUNTER — Other Ambulatory Visit: Payer: Self-pay

## 2018-05-13 MED ORDER — FLECAINIDE ACETATE 100 MG PO TABS: 100.0000 mg | ORAL_TABLET | Freq: Two times a day (BID) | ORAL | 0 refills | Status: DC

## 2018-06-12 ENCOUNTER — Ambulatory Visit (INDEPENDENT_AMBULATORY_CARE_PROVIDER_SITE_OTHER): Payer: Medicare Other | Admitting: *Deleted

## 2018-06-12 DIAGNOSIS — I442 Atrioventricular block, complete: Secondary | ICD-10-CM | POA: Diagnosis not present

## 2018-06-13 ENCOUNTER — Telehealth: Payer: Self-pay

## 2018-06-13 LAB — CUP PACEART REMOTE DEVICE CHECK
Battery Remaining Longevity: 75 mo
Battery Voltage: 3.01 V
Brady Statistic AP VP Percent: 27.35 %
Brady Statistic AP VS Percent: 0 %
Brady Statistic AS VP Percent: 72.61 %
Brady Statistic AS VS Percent: 0.04 %
Brady Statistic RA Percent Paced: 27.31 %
Brady Statistic RV Percent Paced: 99.94 %
Date Time Interrogation Session: 20200521104647
Implantable Lead Implant Date: 20180124
Implantable Lead Implant Date: 20180124
Implantable Lead Location: 753859
Implantable Lead Location: 753860
Implantable Lead Model: 3830
Implantable Lead Model: 5076
Implantable Pulse Generator Implant Date: 20180124
Lead Channel Impedance Value: 342 Ohm
Lead Channel Impedance Value: 380 Ohm
Lead Channel Impedance Value: 437 Ohm
Lead Channel Impedance Value: 513 Ohm
Lead Channel Pacing Threshold Amplitude: 0.5 V
Lead Channel Pacing Threshold Pulse Width: 0.4 ms
Lead Channel Sensing Intrinsic Amplitude: 1.625 mV
Lead Channel Sensing Intrinsic Amplitude: 4.25 mV
Lead Channel Setting Pacing Amplitude: 2 V
Lead Channel Setting Pacing Amplitude: 2 V
Lead Channel Setting Pacing Pulse Width: 1 ms
Lead Channel Setting Sensing Sensitivity: 0.9 mV

## 2018-06-13 NOTE — Telephone Encounter (Signed)
Left message for patient to remind of missed remote transmission.  

## 2018-06-21 ENCOUNTER — Encounter: Payer: Self-pay | Admitting: Cardiology

## 2018-06-21 NOTE — Progress Notes (Signed)
Remote pacemaker transmission.   

## 2018-07-05 ENCOUNTER — Other Ambulatory Visit: Payer: Self-pay

## 2018-07-05 ENCOUNTER — Encounter: Payer: Self-pay | Admitting: Anesthesiology

## 2018-07-05 ENCOUNTER — Ambulatory Visit: Payer: Medicare Other | Attending: Anesthesiology | Admitting: Anesthesiology

## 2018-07-05 DIAGNOSIS — F1190 Opioid use, unspecified, uncomplicated: Secondary | ICD-10-CM

## 2018-07-05 DIAGNOSIS — M5136 Other intervertebral disc degeneration, lumbar region: Secondary | ICD-10-CM | POA: Diagnosis not present

## 2018-07-05 DIAGNOSIS — M47816 Spondylosis without myelopathy or radiculopathy, lumbar region: Secondary | ICD-10-CM

## 2018-07-05 DIAGNOSIS — M545 Low back pain: Secondary | ICD-10-CM | POA: Diagnosis not present

## 2018-07-05 DIAGNOSIS — G8929 Other chronic pain: Secondary | ICD-10-CM

## 2018-07-05 DIAGNOSIS — M79641 Pain in right hand: Secondary | ICD-10-CM | POA: Insufficient documentation

## 2018-07-05 DIAGNOSIS — G894 Chronic pain syndrome: Secondary | ICD-10-CM | POA: Insufficient documentation

## 2018-07-05 DIAGNOSIS — M5416 Radiculopathy, lumbar region: Secondary | ICD-10-CM | POA: Diagnosis not present

## 2018-07-05 DIAGNOSIS — M79642 Pain in left hand: Secondary | ICD-10-CM

## 2018-07-05 MED ORDER — HYDROCODONE-ACETAMINOPHEN 5-325 MG PO TABS: 1.0000 | ORAL_TABLET | Freq: Two times a day (BID) | ORAL | 0 refills | Status: AC

## 2018-07-05 MED ORDER — HYDROCODONE-ACETAMINOPHEN 5-325 MG PO TABS: 1.0000 | ORAL_TABLET | Freq: Two times a day (BID) | ORAL | 0 refills | Status: DC

## 2018-07-05 NOTE — Progress Notes (Signed)
Virtual Visit via Telephone Note  home I connected with Nicholas [name] on 07/05/18 at  3:00 PM EDT by telephone and verified that I am speaking with the correct person using two identifiers.  Location: Patient: Home Provider: Pain control center   I discussed the limitations, risks, security and privacy concerns of performing an evaluation and management service by telephone and the availability of in person appointments. I also discussed with the patient that there may be a patient responsible charge related to this service. The patient expressed understanding and agreed to proceed.   History of Present Illness: I spoke with [name] today via telephone conferencing.  He has been doing reasonably well with his low back pain.  He is actually playing some golf and has been more active.  The medications continue to give him good relief and he takes these generally twice a day.  No other change in his lower extremity strength or function or bowel or bladder function is noted.    Observations/Objective:  Current Outpatient Medications:  .  acetaminophen (TYLENOL) 500 MG tablet, Take 2,000 mg by mouth daily as needed for moderate pain or headache., Disp: , Rfl:  .  apixaban (ELIQUIS) 5 MG TABS tablet, Take 1 tablet (5 mg total) by mouth 2 (two) times daily., Disp: 180 tablet, Rfl: 4 .  ascorbic acid (VITAMIN C) 250 MG tablet, Take 250 mg by mouth daily. , Disp: , Rfl:  .  Cranberry 500 MG CAPS, Take 1 capsule by mouth daily. , Disp: , Rfl:  .  Cyanocobalamin (B-12) 1000 MCG CAPS, Take 1,000 mcg by mouth daily. , Disp: , Rfl:  .  cyclobenzaprine (FLEXERIL) 10 MG tablet, Take 10 mg by mouth 3 (three) times daily as needed for muscle spasms., Disp: , Rfl:  .  diclofenac sodium (VOLTAREN) 1 % GEL, diclofenac 1 % topical gel  APPLY 2 GRAMS TO AFFECTED AREA 4 TIMES A DAY, Disp: , Rfl:  .  ferrous sulfate 325 (65 FE) MG tablet, Take 325 mg by mouth daily with breakfast., Disp: , Rfl:  .  flecainide  (TAMBOCOR) 100 MG tablet, Take 1 tablet (100 mg total) by mouth 2 (two) times daily., Disp: 180 tablet, Rfl: 0 .  gabapentin (NEURONTIN) 400 MG capsule, Take 1 capsule (400 mg total) by mouth daily., Disp: 90 capsule, Rfl: 3 .  HYDROcodone-acetaminophen (NORCO/VICODIN) 5-325 MG tablet, Take 1 tablet by mouth 2 (two) times daily for 30 days., Disp: 60 tablet, Rfl: 0 .  [START ON 08/04/2018] HYDROcodone-acetaminophen (NORCO/VICODIN) 5-325 MG tablet, Take 1 tablet by mouth 2 (two) times daily., Disp: 60 tablet, Rfl: 0 .  hyoscyamine (LEVSIN, ANASPAZ) 0.125 MG tablet, Take 0.125 mg by mouth every 6 (six) hours as needed for bladder spasms or cramping. , Disp: , Rfl:  .  LORazepam (ATIVAN) 1 MG tablet, Take 1 tablet (1 mg total) by mouth at bedtime., Disp: 90 tablet, Rfl: 1 .  Magnesium 400 MG TABS, Take 400 mg by mouth daily. , Disp: , Rfl:  .  Melatonin 5 MG TABS, Take 10 mg by mouth at bedtime., Disp: , Rfl:  .  Multiple Vitamin (MULTI-VITAMINS) TABS, Take 1 tablet by mouth daily. , Disp: , Rfl:  .  naproxen sodium (ALEVE) 220 MG tablet, Take 220 mg by mouth., Disp: , Rfl:  .  Omega-3 Fatty Acids (FISH OIL) 1200 MG CAPS, Take 1,200 mg by mouth daily., Disp: , Rfl:  .  oxymetazoline (AFRIN) 0.05 % nasal spray, Place 2  sprays into both nostrils at bedtime as needed for congestion., Disp: , Rfl:  .  pantoprazole (PROTONIX) 40 MG tablet, TAKE 1 TABLET (40 MG TOTAL) BY MOUTH ONCE DAILY TAKE 30 MINS BEFORE MEAL, Disp: , Rfl: 6 .  QUEtiapine Fumarate (SEROQUEL XR) 150 MG 24 hr tablet, Take 1 tablet (150 mg total) by mouth at bedtime., Disp: 90 tablet, Rfl: 4 .  simvastatin (ZOCOR) 20 MG tablet, Take 1 tablet (20 mg total) by mouth daily., Disp: 90 tablet, Rfl: 4 .  solifenacin (VESICARE) 10 MG tablet, Take 1 tablet (10 mg total) by mouth daily., Disp: 90 tablet, Rfl: 4 .  venlafaxine XR (EFFEXOR XR) 75 MG 24 hr capsule, Take 3 capsules (225 mg total) by mouth daily., Disp: 270 capsule, Rfl: 4  Assessment and  Plan: 1. Lumbar radiculopathy   2. Degeneration of lumbar intervertebral disc   3. Chronic bilateral low back pain without sciatica   4. Chronic, continuous use of opioids   5. Chronic pain syndrome   6. Bilateral hand pain   7. Facet syndrome, lumbar   Based on our discussion today and upon review of the [facility] practitioner database information I want him to continue with his current regimen and we will refill his medicines for June 12 and July 12.  I have him scheduled for return to clinic in 2 months and he is to continue his stretching strengthening exercises and follow-up with his primary care physicians for his baseline medical care peer   Follow Up Instructions:    I discussed the assessment and treatment plan with the patient. The patient was provided an opportunity to ask questions and all were answered. The patient agreed with the plan and demonstrated an understanding of the instructions.   The patient was advised to call back or seek an in-person evaluation if the symptoms worsen or if the condition fails to improve as anticipated.  I provided.  30 minutes of non-face-to-face time during this encounter.   [name], MD

## 2018-07-11 ENCOUNTER — Ambulatory Visit (INDEPENDENT_AMBULATORY_CARE_PROVIDER_SITE_OTHER): Payer: Medicare Other | Admitting: Family Medicine

## 2018-07-11 ENCOUNTER — Other Ambulatory Visit: Payer: Self-pay

## 2018-07-11 ENCOUNTER — Encounter: Payer: Self-pay | Admitting: Family Medicine

## 2018-07-11 DIAGNOSIS — F339 Major depressive disorder, recurrent, unspecified: Secondary | ICD-10-CM | POA: Diagnosis not present

## 2018-07-11 DIAGNOSIS — I10 Essential (primary) hypertension: Secondary | ICD-10-CM

## 2018-07-11 DIAGNOSIS — F419 Anxiety disorder, unspecified: Secondary | ICD-10-CM | POA: Diagnosis not present

## 2018-07-11 DIAGNOSIS — E785 Hyperlipidemia, unspecified: Secondary | ICD-10-CM

## 2018-07-11 DIAGNOSIS — K219 Gastro-esophageal reflux disease without esophagitis: Secondary | ICD-10-CM | POA: Diagnosis not present

## 2018-07-11 DIAGNOSIS — N400 Enlarged prostate without lower urinary tract symptoms: Secondary | ICD-10-CM

## 2018-07-11 DIAGNOSIS — Z7189 Other specified counseling: Secondary | ICD-10-CM

## 2018-07-11 MED ORDER — QUETIAPINE FUMARATE ER 150 MG PO TB24: 150.0000 mg | ORAL_TABLET | Freq: Every day | ORAL | 4 refills | Status: DC

## 2018-07-11 MED ORDER — APIXABAN 5 MG PO TABS: 5.0000 mg | ORAL_TABLET | Freq: Two times a day (BID) | ORAL | 4 refills | Status: DC

## 2018-07-11 MED ORDER — VENLAFAXINE HCL ER 75 MG PO CP24: 225.0000 mg | ORAL_CAPSULE | Freq: Every day | ORAL | 4 refills | Status: DC

## 2018-07-11 MED ORDER — GABAPENTIN 400 MG PO CAPS: 400.0000 mg | ORAL_CAPSULE | Freq: Every day | ORAL | 3 refills | Status: DC

## 2018-07-11 MED ORDER — SIMVASTATIN 20 MG PO TABS: 20.0000 mg | ORAL_TABLET | Freq: Every day | ORAL | 4 refills | Status: DC

## 2018-07-11 MED ORDER — SOLIFENACIN SUCCINATE 10 MG PO TABS: 10.0000 mg | ORAL_TABLET | Freq: Every day | ORAL | 4 refills | Status: DC

## 2018-07-11 MED ORDER — LORAZEPAM 1 MG PO TABS: 1.0000 mg | ORAL_TABLET | Freq: Every day | ORAL | 1 refills | Status: DC

## 2018-07-11 NOTE — Assessment & Plan Note (Signed)
A voluntary discussion about advanced care planning including explanation and discussion of advanced directives was extentively discussed with the patient.  Explained about the healthcare proxy and living will was reviewed and packet with forms with expiration of how to fill them out was given.  Time spent: Encounter 16+ min individuals present: Patient 

## 2018-07-11 NOTE — Assessment & Plan Note (Signed)
The current medical regimen is effective;  continue present plan and medications.  

## 2018-07-11 NOTE — Progress Notes (Signed)
There were no vitals taken for this visit.   Subjective:    Patient ID: Nicholas [name], male    DOB: 09-02-1945, 73 y.o.   MRN: [id number]  HPI: Nicholas [name] is a 73 y.o. male  Med check  Discussed with patient all in all doing well no complaints Taking medication faithfully without problems takes lorazepam at bedtime every day.  Stable usage Depression stable but has had a house full.  Reviewed COVID-19 precautions and risk. Taking Eliquis without problems no bleeding bruising issues.   Relevant past medical, surgical, family and social history reviewed and updated as indicated. Interim medical history since our last visit reviewed. Allergies and medications reviewed and updated.  Review of Systems  Constitutional: Negative.   Respiratory: Negative.   Cardiovascular: Negative.     Per HPI unless specifically indicated above     Objective:    There were no vitals taken for this visit.  Wt Readings from Last 3 Encounters:  04/18/18 280 lb (127 kg)  03/21/18 280 lb 9 oz (127.3 kg)  02/25/18 283 lb (128.4 kg)    Physical Exam  Results for orders placed or performed in visit on 06/12/18  CUP PACEART REMOTE DEVICE CHECK  Result Value Ref Range   Date Time Interrogation Session [id number]    Pulse Generator Manufacturer MERM    Pulse Gen Model [id number] Advisa DR MRI    Pulse Gen Serial Number [id number] [facility]    Implantable Pulse Generator Type Implantable Pulse Generator    Implantable Pulse Generator Implant Date [id number]    Implantable Lead Manufacturer MERM    Implantable Lead Model 3830 SelectSecure MRI SureScan    Implantable Lead Serial Number [id number]    Implantable Lead Implant Date [id number]    Implantable Lead Location Detail 1 UNKNOWN    Implantable Lead Special Function Bundle of His    Implantable Lead Location [id number]    Implantable Lead Manufacturer MERM    Implantable Lead Model 5076 CapSureFix Novus MRI SureScan    Implantable Lead Serial Number [id number]    Implantable Lead Implant Date [id number]    Implantable Lead Location Detail 1 APPENDAGE    Implantable Lead Location [id number]    Lead Channel Setting Sensing Sensitivity 0.9 mV   Lead Channel Setting Pacing Amplitude 2 V   Lead Channel Setting Pacing Pulse Width 1 ms   Lead Channel Setting Pacing Amplitude 2 V   Lead Channel Impedance Value 437 ohm   Lead Channel Impedance Value 380 ohm   Lead Channel Sensing Intrinsic Amplitude 4.25 mV   Lead Channel Pacing Threshold Amplitude 0.5 V   Lead Channel Pacing Threshold Pulse Width 0.4 ms   Lead Channel Impedance Value 513 ohm   Lead Channel Impedance Value 342 ohm   Lead Channel Sensing Intrinsic Amplitude 1.625 mV   Battery Status OK    Battery Remaining Longevity 75 mo   Battery Voltage 3.01 V   Brady Statistic RA Percent Paced 27.31 %   Brady Statistic RV Percent Paced 99.94 %   Brady Statistic AP VP Percent 27.35 %   Brady Statistic AS VP Percent 72.61 %   Brady Statistic AP VS Percent 0 %   Brady Statistic AS VS Percent 0.04 %      Assessment & Plan:   Problem List Items Addressed This Visit      Cardiovascular and Mediastinum   HTN (hypertension) (Chronic)    The current medical regimen is  effective;  continue present plan and medications.       Relevant Medications   solifenacin (VESICARE) 10 MG tablet   simvastatin (ZOCOR) 20 MG tablet   apixaban (ELIQUIS) 5 MG TABS tablet   Other Relevant Orders   Comprehensive metabolic panel   Lipid panel   CBC with Differential/Platelet   TSH   Urinalysis, Routine w reflex microscopic     Digestive   GERD (gastroesophageal reflux disease) (Chronic)    The current medical regimen is effective;  continue present plan and medications.         Genitourinary   BPH (benign prostatic hyperplasia)   Relevant Orders   PSA     Other   Dyslipidemia (Chronic)    The current medical regimen is effective;  continue present plan and  medications.       Relevant Medications   simvastatin (ZOCOR) 20 MG tablet   Other Relevant Orders   Comprehensive metabolic panel   Lipid panel   CBC with Differential/Platelet   TSH   Urinalysis, Routine w reflex microscopic   Advanced care planning/counseling discussion    A voluntary discussion about advanced care planning including explanation and discussion of advanced directives was extentively discussed with the patient.  Explained about the healthcare proxy and living will was reviewed and packet with forms with expiration of how to fill them out was given.  Time spent: Encounter 16+ min individuals present: Patient      Depression, recurrent ([place])    The current medical regimen is effective;  continue present plan and medications.       Relevant Medications   QUEtiapine Fumarate (SEROQUEL XR) 150 MG 24 hr tablet   venlafaxine XR (EFFEXOR XR) 75 MG 24 hr capsule   LORazepam (ATIVAN) 1 MG tablet   Other Relevant Orders   Comprehensive metabolic panel   CBC with Differential/Platelet   TSH   Urinalysis, Routine w reflex microscopic   Anxiety   Relevant Medications   QUEtiapine Fumarate (SEROQUEL XR) 150 MG 24 hr tablet   venlafaxine XR (EFFEXOR XR) 75 MG 24 hr capsule   LORazepam (ATIVAN) 1 MG tablet       Follow up plan: Return for Physical Exam, soon and 6 mo f/u, BMP,  Lipids, ALT, AST, Hemoglobin A1c.

## 2018-07-16 ENCOUNTER — Other Ambulatory Visit: Payer: Medicare Other

## 2018-07-16 ENCOUNTER — Other Ambulatory Visit: Payer: Self-pay

## 2018-07-16 DIAGNOSIS — I10 Essential (primary) hypertension: Secondary | ICD-10-CM | POA: Diagnosis not present

## 2018-07-16 DIAGNOSIS — E785 Hyperlipidemia, unspecified: Secondary | ICD-10-CM | POA: Diagnosis not present

## 2018-07-16 DIAGNOSIS — F339 Major depressive disorder, recurrent, unspecified: Secondary | ICD-10-CM | POA: Diagnosis not present

## 2018-07-16 DIAGNOSIS — N400 Enlarged prostate without lower urinary tract symptoms: Secondary | ICD-10-CM

## 2018-07-16 LAB — URINALYSIS, ROUTINE W REFLEX MICROSCOPIC
Bilirubin, UA: NEGATIVE
Glucose, UA: NEGATIVE
Ketones, UA: NEGATIVE
Leukocytes,UA: NEGATIVE
Nitrite, UA: NEGATIVE
Protein,UA: NEGATIVE
RBC, UA: NEGATIVE
Specific Gravity, UA: 1.01 (ref 1.005–1.030)
Urobilinogen, Ur: 0.2 mg/dL (ref 0.2–1.0)
pH, UA: 6 (ref 5.0–7.5)

## 2018-07-17 LAB — COMPREHENSIVE METABOLIC PANEL
ALT: 15 IU/L (ref 0–44)
AST: 27 IU/L (ref 0–40)
Albumin/Globulin Ratio: 2.1 (ref 1.2–2.2)
Albumin: 4.5 g/dL (ref 3.7–4.7)
Alkaline Phosphatase: 50 IU/L (ref 39–117)
BUN/Creatinine Ratio: 19 (ref 10–24)
BUN: 21 mg/dL (ref 8–27)
Bilirubin Total: 0.5 mg/dL (ref 0.0–1.2)
CO2: 23 mmol/L (ref 20–29)
Calcium: 9.1 mg/dL (ref 8.6–10.2)
Chloride: 102 mmol/L (ref 96–106)
Creatinine, Ser: 1.11 mg/dL (ref 0.76–1.27)
GFR calc Af Amer: 76 mL/min/{1.73_m2} (ref >59)
GFR calc non Af Amer: 66 mL/min/{1.73_m2} (ref >59)
Globulin, Total: 2.1 g/dL (ref 1.5–4.5)
Glucose: 154 mg/dL — ABNORMAL HIGH (ref 65–99)
Potassium: 4.3 mmol/L (ref 3.5–5.2)
Sodium: 140 mmol/L (ref 134–144)
Total Protein: 6.6 g/dL (ref 6.0–8.5)

## 2018-07-17 LAB — CBC WITH DIFFERENTIAL/PLATELET
Basophils Absolute: 0.1 10*3/uL (ref 0.0–0.2)
Basos: 1 %
EOS (ABSOLUTE): 0.3 10*3/uL (ref 0.0–0.4)
Eos: 4 %
Hematocrit: 44.5 % (ref 37.5–51.0)
Hemoglobin: 14.7 g/dL (ref 13.0–17.7)
Immature Grans (Abs): 0 10*3/uL (ref 0.0–0.1)
Immature Granulocytes: 0 %
Lymphocytes Absolute: 1.2 10*3/uL (ref 0.7–3.1)
Lymphs: 21 %
MCH: 30.8 pg (ref 26.6–33.0)
MCHC: 33 g/dL (ref 31.5–35.7)
MCV: 93 fL (ref 79–97)
Monocytes Absolute: 0.4 10*3/uL (ref 0.1–0.9)
Monocytes: 7 %
Neutrophils Absolute: 4.1 10*3/uL (ref 1.4–7.0)
Neutrophils: 67 %
Platelets: 164 10*3/uL (ref 150–450)
RBC: 4.78 x10E6/uL (ref 4.14–5.80)
RDW: 12.9 % (ref 11.6–15.4)
WBC: 6.1 10*3/uL (ref 3.4–10.8)

## 2018-07-17 LAB — PSA: Prostate Specific Ag, Serum: 3.8 ng/mL (ref 0.0–4.0)

## 2018-07-17 LAB — LIPID PANEL
Chol/HDL Ratio: 3.6 ratio (ref 0.0–5.0)
Cholesterol, Total: 173 mg/dL (ref 100–199)
HDL: 48 mg/dL (ref >39)
LDL Calculated: 99 mg/dL (ref 0–99)
Triglycerides: 131 mg/dL (ref 0–149)
VLDL Cholesterol Cal: 26 mg/dL (ref 5–40)

## 2018-07-17 LAB — TSH: TSH: 1.47 u[IU]/mL (ref 0.450–4.500)

## 2018-07-18 DIAGNOSIS — R1031 Right lower quadrant pain: Secondary | ICD-10-CM | POA: Diagnosis not present

## 2018-07-18 DIAGNOSIS — K219 Gastro-esophageal reflux disease without esophagitis: Secondary | ICD-10-CM | POA: Diagnosis not present

## 2018-07-18 DIAGNOSIS — R1310 Dysphagia, unspecified: Secondary | ICD-10-CM | POA: Diagnosis not present

## 2018-07-18 DIAGNOSIS — R1032 Left lower quadrant pain: Secondary | ICD-10-CM | POA: Diagnosis not present

## 2018-07-22 ENCOUNTER — Encounter: Payer: Self-pay | Admitting: Family Medicine

## 2018-08-11 ENCOUNTER — Other Ambulatory Visit: Payer: Self-pay | Admitting: Internal Medicine

## 2018-08-13 NOTE — Telephone Encounter (Signed)
Please contact pt for Overdue 6 month f/u appointment with Caryl Comes. Pt last seen 09/2017 pt needing refills.

## 2018-08-14 ENCOUNTER — Telehealth: Payer: Self-pay

## 2018-08-14 NOTE — Progress Notes (Signed)
Depression screen [facility] 2/9 08/14/2018  Decreased Interest 0  Down, Depressed, Hopeless 0  PHQ - 2 Score 0  Altered sleeping 1  Tired, decreased energy 3  Change in appetite 1  Feeling bad or failure about yourself  2  Trouble concentrating 0  Moving slowly or fidgety/restless 0  Suicidal thoughts 0  PHQ-9 Score 7  Difficult doing work/chores Not difficult at all  Some recent data might be hidden   GAD 7 : Generalized Anxiety Score 08/14/2018 07/02/2017  Nervous, Anxious, on Edge 0 1  Control/stop worrying 1 1  Worry too much - different things 2 2  Trouble relaxing 0 1  Restless 0 0  Easily annoyed or irritable 0 1  Afraid - awful might happen 1 2  Total GAD 7 Score 4 8  Anxiety Difficulty Not difficult at all Somewhat difficult

## 2018-08-14 NOTE — Telephone Encounter (Signed)
PHQ9 and GAD7 overdue. Called and went over questionnaire with patient. Routing to provider to advise.   Depression screen [facility] 2/9 08/14/2018  Decreased Interest 0  Down, Depressed, Hopeless 0  PHQ - 2 Score 0  Altered sleeping 1  Tired, decreased energy 3  Change in appetite 1  Feeling bad or failure about yourself  2  Trouble concentrating 0  Moving slowly or fidgety/restless 0  Suicidal thoughts 0  PHQ-9 Score 7  Difficult doing work/chores Not difficult at all  Some recent data might be hidden   GAD 7 : Generalized Anxiety Score 08/14/2018 07/02/2017  Nervous, Anxious, on Edge 0 1  Control/stop worrying 1 1  Worry too much - different things 2 2  Trouble relaxing 0 1  Restless 0 0  Easily annoyed or irritable 0 1  Afraid - awful might happen 1 2  Total GAD 7 Score 4 8  Anxiety Difficulty Not difficult at all Somewhat difficult

## 2018-09-11 ENCOUNTER — Ambulatory Visit (INDEPENDENT_AMBULATORY_CARE_PROVIDER_SITE_OTHER): Payer: Medicare Other | Admitting: *Deleted

## 2018-09-11 DIAGNOSIS — I442 Atrioventricular block, complete: Secondary | ICD-10-CM

## 2018-09-11 LAB — CUP PACEART REMOTE DEVICE CHECK
Battery Remaining Longevity: 69 mo
Battery Voltage: 3 V
Brady Statistic AP VP Percent: 23.8 %
Brady Statistic AP VS Percent: 0 %
Brady Statistic AS VP Percent: 76.16 %
Brady Statistic AS VS Percent: 0.04 %
Brady Statistic RA Percent Paced: 23.78 %
Brady Statistic RV Percent Paced: 99.94 %
Date Time Interrogation Session: 20200819174759
Implantable Lead Implant Date: 20180124
Implantable Lead Implant Date: 20180124
Implantable Lead Location: 753859
Implantable Lead Location: 753860
Implantable Lead Model: 3830
Implantable Lead Model: 5076
Implantable Pulse Generator Implant Date: 20180124
Lead Channel Impedance Value: 361 Ohm
Lead Channel Impedance Value: 380 Ohm
Lead Channel Impedance Value: 475 Ohm
Lead Channel Impedance Value: 532 Ohm
Lead Channel Pacing Threshold Amplitude: 0.625 V
Lead Channel Pacing Threshold Amplitude: 0.75 V
Lead Channel Pacing Threshold Pulse Width: 0.4 ms
Lead Channel Pacing Threshold Pulse Width: 0.4 ms
Lead Channel Sensing Intrinsic Amplitude: 1.625 mV
Lead Channel Sensing Intrinsic Amplitude: 1.625 mV
Lead Channel Sensing Intrinsic Amplitude: 4.625 mV
Lead Channel Sensing Intrinsic Amplitude: 4.625 mV
Lead Channel Setting Pacing Amplitude: 2 V
Lead Channel Setting Pacing Amplitude: 2 V
Lead Channel Setting Pacing Pulse Width: 1 ms
Lead Channel Setting Sensing Sensitivity: 0.9 mV

## 2018-09-16 ENCOUNTER — Ambulatory Visit: Payer: Medicare Other | Attending: Anesthesiology | Admitting: Anesthesiology

## 2018-09-16 ENCOUNTER — Encounter: Payer: Self-pay | Admitting: Anesthesiology

## 2018-09-16 ENCOUNTER — Other Ambulatory Visit: Payer: Self-pay

## 2018-09-16 DIAGNOSIS — F1190 Opioid use, unspecified, uncomplicated: Secondary | ICD-10-CM | POA: Diagnosis not present

## 2018-09-16 DIAGNOSIS — M5416 Radiculopathy, lumbar region: Secondary | ICD-10-CM | POA: Diagnosis not present

## 2018-09-16 DIAGNOSIS — M79642 Pain in left hand: Secondary | ICD-10-CM

## 2018-09-16 DIAGNOSIS — M47816 Spondylosis without myelopathy or radiculopathy, lumbar region: Secondary | ICD-10-CM

## 2018-09-16 DIAGNOSIS — M5136 Other intervertebral disc degeneration, lumbar region: Secondary | ICD-10-CM

## 2018-09-16 DIAGNOSIS — G894 Chronic pain syndrome: Secondary | ICD-10-CM

## 2018-09-16 DIAGNOSIS — M1611 Unilateral primary osteoarthritis, right hip: Secondary | ICD-10-CM

## 2018-09-16 DIAGNOSIS — M79641 Pain in right hand: Secondary | ICD-10-CM

## 2018-09-16 MED ORDER — HYDROCODONE-ACETAMINOPHEN 5-325 MG PO TABS: 1.0000 | ORAL_TABLET | Freq: Two times a day (BID) | ORAL | 0 refills | Status: AC

## 2018-09-16 MED ORDER — HYDROCODONE-ACETAMINOPHEN 5-325 MG PO TABS: 1.0000 | ORAL_TABLET | Freq: Four times a day (QID) | ORAL | 0 refills | Status: DC | PRN

## 2018-09-18 ENCOUNTER — Other Ambulatory Visit: Payer: Self-pay | Admitting: Internal Medicine

## 2018-09-18 NOTE — Telephone Encounter (Signed)
Please review for refill.  

## 2018-09-18 NOTE — Progress Notes (Signed)
Virtual Visit via Video Note  I connected with Nicholas [name] on 09/18/18 at  2:30 PM EDT by a video enabled telemedicine application and verified that I am speaking with the correct person using two identifiers.  Location: Patient: Home Provider: Pain control center   I discussed the limitations of evaluation and management by telemedicine and the availability of in person appointments. The patient expressed understanding and agreed to proceed.  History of Present Illness: I was able to reach out via virtual conferencing with Nicholas [name].  He has been doing reasonably well considering the persistent low back pain and leg pain he experiences.  He has been taking his medications as prescribed and these continue to work well for him.  He denies any side effects with the medication and is continued to derive good functional lifestyle improvement with the medicines.  No change in the pain quality characteristic or distribution is noted at this time.  He is continue to attempt weight loss with some success.  He is staying as active as possible with stretching strengthening as best he can.    Observations/Objective: Current Outpatient Medications:  .  acetaminophen (TYLENOL) 500 MG tablet, Take 2,000 mg by mouth daily as needed for moderate pain or headache., Disp: , Rfl:  .  apixaban (ELIQUIS) 5 MG TABS tablet, Take 1 tablet (5 mg total) by mouth 2 (two) times daily., Disp: 180 tablet, Rfl: 4 .  ascorbic acid (VITAMIN C) 250 MG tablet, Take 250 mg by mouth daily. , Disp: , Rfl:  .  Cranberry 500 MG CAPS, Take 1 capsule by mouth daily. , Disp: , Rfl:  .  Cyanocobalamin (B-12) 1000 MCG CAPS, Take 1,000 mcg by mouth daily. , Disp: , Rfl:  .  cyclobenzaprine (FLEXERIL) 10 MG tablet, Take 10 mg by mouth 3 (three) times daily as needed for muscle spasms., Disp: , Rfl:  .  diclofenac sodium (VOLTAREN) 1 % GEL, diclofenac 1 % topical gel  APPLY 2 GRAMS TO AFFECTED AREA 4 TIMES A DAY, Disp: , Rfl:  .   ferrous sulfate 325 (65 FE) MG tablet, Take 325 mg by mouth daily with breakfast., Disp: , Rfl:  .  flecainide (TAMBOCOR) 100 MG tablet, TAKE 1 TABLET BY MOUTH TWICE A DAY **NEED APPT FOR MORE REFILLS**, Disp: 60 tablet, Rfl: 0 .  gabapentin (NEURONTIN) 400 MG capsule, Take 1 capsule (400 mg total) by mouth daily., Disp: 90 capsule, Rfl: 3 .  HYDROcodone-acetaminophen (NORCO/VICODIN) 5-325 MG tablet, Take 1 tablet by mouth 2 (two) times daily., Disp: 60 tablet, Rfl: 0 .  [START ON 10/16/2018] HYDROcodone-acetaminophen (NORCO/VICODIN) 5-325 MG tablet, Take 1 tablet by mouth every 6 (six) hours as needed for moderate pain or severe pain., Disp: 60 tablet, Rfl: 0 .  hyoscyamine (LEVSIN, ANASPAZ) 0.125 MG tablet, Take 0.125 mg by mouth every 6 (six) hours as needed for bladder spasms or cramping. , Disp: , Rfl:  .  LORazepam (ATIVAN) 1 MG tablet, Take 1 tablet (1 mg total) by mouth at bedtime., Disp: 90 tablet, Rfl: 1 .  Magnesium 400 MG TABS, Take 400 mg by mouth daily. , Disp: , Rfl:  .  Melatonin 5 MG TABS, Take 10 mg by mouth at bedtime., Disp: , Rfl:  .  Multiple Vitamin (MULTI-VITAMINS) TABS, Take 1 tablet by mouth daily. , Disp: , Rfl:  .  naproxen sodium (ALEVE) 220 MG tablet, Take 220 mg by mouth., Disp: , Rfl:  .  Omega-3 Fatty Acids (FISH OIL) 1200  MG CAPS, Take 1,200 mg by mouth daily., Disp: , Rfl:  .  oxymetazoline (AFRIN) 0.05 % nasal spray, Place 2 sprays into both nostrils at bedtime as needed for congestion., Disp: , Rfl:  .  pantoprazole (PROTONIX) 40 MG tablet, TAKE 1 TABLET (40 MG TOTAL) BY MOUTH ONCE DAILY TAKE 30 MINS BEFORE MEAL, Disp: , Rfl: 6 .  QUEtiapine Fumarate (SEROQUEL XR) 150 MG 24 hr tablet, Take 1 tablet (150 mg total) by mouth at bedtime., Disp: 90 tablet, Rfl: 4 .  simvastatin (ZOCOR) 20 MG tablet, Take 1 tablet (20 mg total) by mouth daily., Disp: 90 tablet, Rfl: 4 .  solifenacin (VESICARE) 10 MG tablet, Take 1 tablet (10 mg total) by mouth daily., Disp: 90 tablet,  Rfl: 4 .  venlafaxine XR (EFFEXOR XR) 75 MG 24 hr capsule, Take 3 capsules (225 mg total) by mouth daily., Disp: 270 capsule, Rfl: 4   Assessment and Plan: 1. Lumbar radiculopathy   2. Degeneration of lumbar intervertebral disc   3. Chronic, continuous use of opioids   4. Chronic pain syndrome   5. Bilateral hand pain   6. Facet syndrome, lumbar   7. Osteoarthritis of right hip, unspecified osteoarthritis type   Based on our discussion today and upon review of the [facility] practitioner database information I am going to refill his medications for August 24 and September 23.  Will defer on any other interventional therapy at this point and have encouraged him to continue efforts at weight loss and stretching strengthening.  I want him to continue playing golf as well.  We will schedule him for return to clinic in 2 months.  He is to continue follow-up with his primary care physicians for his baseline medical care.  Follow Up Instructions:    I discussed the assessment and treatment plan with the patient. The patient was provided an opportunity to ask questions and all were answered. The patient agreed with the plan and demonstrated an understanding of the instructions.   The patient was advised to call back or seek an in-person evaluation if the symptoms worsen or if the condition fails to improve as anticipated.  I provided 30 minutes of non-face-to-face time during this encounter.   [name], MD

## 2018-09-19 ENCOUNTER — Encounter: Payer: Self-pay | Admitting: Cardiology

## 2018-09-19 NOTE — Progress Notes (Signed)
Remote pacemaker transmission.   

## 2018-09-23 NOTE — Progress Notes (Signed)
Patient Care Team: [name], MD as PCP - General (Family Medicine) [name], MD (Specialist) Hollice Espy, MD as Consulting Physician (Urology) Tanda Rockers, MD as Consulting Physician (Pulmonary Disease) [name], MD as Consulting Physician (Anesthesiology) Deboraha Sprang, MD as Consulting Physician (Cardiology)   HPI  Nicholas [name] is a 73 y.o. 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  It was elected to start him on flecainide.   He has obstructive sleep apnea by history but has not been interested in testing or therapy  Also hx of falls/syncope    DATE TEST EF   10/15 echo     50 %   Mild RV dysfunction   11/17 echo    60 %    Nl RV function   11/17 Myoview   24 [facility])     Also history of pulmonary embolism with repeat venous Dopplers 1/16 demonstrated small clots Managed with chronic apixaban  DATE PR interval QRSduration Dose  3/19 228 126 (HIS pacing) 100  9/19           Date Cr Hgb  6/19 1.08 14.6  6/20 1.13 [id number]     Thromboembolic risk factors ( age -30 , HTN-1) for a CHADSVASc Score of 2   When seen in the emergency room 5/19 "dehydrated "he been out playing golf.  Other episode of syncope was without prior heat exposure.   Past Medical History:  Diagnosis Date  . Anterior urethral stricture   . Anxiety   . Arthritis    a. knees, hips, hands;  b. 11/2013 s/p L TKA @ [place].  . Bile reflux gastritis   . Bulging lumbar disc   . BXO (balanitis xerotica obliterans)   . 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 Depression   . DVT (deep venous thrombosis) ([place])   . Erosive esophagitis   . Gross hematuria   . Hyperlipemia   . Hypertension     borderline  . Internal hemorrhoids   . Phimosis   . Pulmonary embolism [facility])     Past Surgical History:  Procedure Laterality Date  . BUNIONECTOMY Bilateral 01/06/2015   Procedure: BUNIONECTOMY;  Surgeon: [name], MD;  Location: [facility] ORS;  Service: Orthopedics;  Laterality: Bilateral;  . CARDIAC CATHETERIZATION  ~ 2005   "once"  . CATARACT EXTRACTION W/ INTRAOCULAR LENS  IMPLANT, BILATERAL Bilateral ~ 2010  . COLONOSCOPY WITH PROPOFOL N/A 12/07/2015   Procedure: COLONOSCOPY WITH PROPOFOL;  Surgeon: Lollie Sails, MD;  Location[facility] ENDOSCOPY;  Service: Endoscopy;  Laterality: N/A;  . EP IMPLANTABLE DEVICE N/A 02/16/2016   MDT dual chamber (His Bundle) pacemaker implanted by Dr Caryl Comes for intermittent complete heart block  . ESOPHAGOGASTRODUODENOSCOPY (EGD) WITH PROPOFOL N/A 12/07/2015   Procedure: ESOPHAGOGASTRODUODENOSCOPY (EGD) WITH PROPOFOL;  Surgeon: Lollie Sails, MD;  Location: [facility] ENDOSCOPY;  Service: Endoscopy;  Laterality: N/A;  . ESOPHAGOGASTRODUODENOSCOPY (EGD) WITH PROPOFOL N/A 05/17/2017   Procedure: ESOPHAGOGASTRODUODENOSCOPY (EGD) WITH PROPOFOL;  Surgeon: Lollie Sails, MD;  Location: [facility] ENDOSCOPY;  Service: Endoscopy;  Laterality: N/A;  . HAMMER TOE SURGERY Bilateral 01/06/2015   Procedure: HAMMER TOE CORRECTION;  Surgeon: [name], MD;  Location: [facility] ORS;  Service: Orthopedics;  Laterality: Bilateral;  . KNEE CARTILAGE SURGERY Left 1965   "football injury"  . Left Total Knee Arthroplasty     a. 11/2013 [facility].  Marland Kitchen PILONIDAL CYST EXCISION  1970's  . TOTAL HIP ARTHROPLASTY Right 2004  . TOTAL HIP ARTHROPLASTY Left 2006  . UPPER GI ENDOSCOPY      Current Outpatient Medications  Medication Sig Dispense Refill  . acetaminophen (TYLENOL) 500 MG tablet Take 2,000 mg by mouth daily as needed for moderate pain or headache.    Marland Kitchen apixaban (ELIQUIS) 5 MG TABS tablet Take 1 tablet (5 mg total) by mouth 2 (two) times daily. 180 tablet 4  . ascorbic  acid (VITAMIN C) 250 MG tablet Take 250 mg by mouth daily.     . Cranberry 500 MG CAPS Take 1 capsule by mouth daily.     . Cyanocobalamin (B-12) 1000 MCG CAPS Take 1,000 mcg by mouth daily.     . diclofenac sodium (VOLTAREN) 1 % GEL diclofenac 1 % topical gel  APPLY 2 GRAMS TO AFFECTED AREA 4 TIMES A DAY    . ferrous sulfate 325 (65 FE) MG tablet Take 325 mg by mouth daily with breakfast.    . flecainide (TAMBOCOR) 100 MG tablet TAKE 1 TABLET BY MOUTH TWICE A DAY **NEED APPT FOR MORE REFILLS** 60 tablet 1  . gabapentin (NEURONTIN) 400 MG capsule Take 1 capsule (400 mg total) by mouth daily. 90 capsule 3  . HYDROcodone-acetaminophen (NORCO/VICODIN) 5-325 MG tablet Take 1 tablet by mouth 2 (two) times daily. 60 tablet 0  . [START ON 10/16/2018] HYDROcodone-acetaminophen (NORCO/VICODIN) 5-325 MG tablet Take 1 tablet by mouth every 6 (six) hours as needed for moderate pain or severe pain. 60 tablet 0  . hyoscyamine (LEVSIN, ANASPAZ) 0.125 MG tablet Take 0.125 mg by mouth every 6 (six) hours as needed for bladder spasms or cramping.     Marland Kitchen LORazepam (ATIVAN) 1 MG tablet Take 1 tablet (1 mg total) by mouth at bedtime. 90 tablet 1  . Magnesium 400 MG TABS Take 400 mg by mouth daily.     . Melatonin 5 MG TABS Take 10 mg by mouth at bedtime.    . Multiple Vitamin (MULTI-VITAMINS) TABS Take 1 tablet by mouth daily.     . Omega-3 Fatty Acids (FISH OIL) 1200 MG CAPS Take 1,200 mg by mouth daily.    Marland Kitchen oxymetazoline (AFRIN) 0.05 % nasal spray Place 2 sprays into both nostrils at bedtime as needed for congestion.    . pantoprazole (PROTONIX) 40 MG tablet TAKE 1 TABLET (40 MG TOTAL) BY MOUTH ONCE DAILY TAKE 30 MINS BEFORE MEAL  6  . Probiotic Product ([place]) CAPS Take by mouth.    . QUEtiapine Fumarate (SEROQUEL XR) 150 MG 24 hr tablet Take 1 tablet (150 mg total) by mouth at bedtime. 90 tablet 4  . simvastatin (ZOCOR) 20 MG tablet Take 1 tablet (20 mg total) by mouth daily. 90 tablet 4  .  solifenacin (VESICARE) 10 MG tablet Take 1 tablet (10 mg total) by mouth daily. 90 tablet 4  . venlafaxine XR (EFFEXOR XR) 75 MG 24 hr capsule Take 3 capsules (225 mg total) by mouth daily. 270 capsule 4   No current facility-administered medications for this visit.     No Known Allergies    Review of Systems negative except from HPI and PMH  Physical Exam   BP 138/82 (BP Location: Left Arm, Patient Position: Sitting, Cuff Size: Large)  Pulse 87   Ht 5\' 11"  (1.803 m)   Wt 282 lb (127.9 kg)   SpO2 94%   BMI 39.33 kg/m   Well developed and Morbidly obese in no acute distress HENT normal Neck supple with JVP-flat Clear Device pocket well healed; without hematoma or erythema.  There is no tethering  Regular rate and rhythm, no  gallop No murmur Abd-soft with active BS No Clubbing cyanosis  edema Skin-warm and dry A & Oriented  Grossly normal sensory and motor function  ECG sinus with P-synchronous/ AV  pacing 15/11/39   Assessment and  Plan  His bundle pacing- selective The patient's device was interrogated.  The information was reviewed. No changes were made in the programming.     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

## 2018-09-24 ENCOUNTER — Ambulatory Visit (INDEPENDENT_AMBULATORY_CARE_PROVIDER_SITE_OTHER): Payer: Medicare Other | Admitting: Internal Medicine

## 2018-09-24 ENCOUNTER — Other Ambulatory Visit: Payer: Self-pay

## 2018-09-24 ENCOUNTER — Encounter: Payer: Self-pay | Admitting: Internal Medicine

## 2018-09-24 VITALS — BP 138/82 | HR 87 | Ht 71.0 in | Wt 282.0 lb

## 2018-09-24 DIAGNOSIS — I480 Paroxysmal atrial fibrillation: Secondary | ICD-10-CM | POA: Diagnosis not present

## 2018-09-24 DIAGNOSIS — Z950 Presence of cardiac pacemaker: Secondary | ICD-10-CM

## 2018-09-24 DIAGNOSIS — I442 Atrioventricular block, complete: Secondary | ICD-10-CM

## 2018-09-24 NOTE — Patient Instructions (Signed)
Medication Instructions:  - Your physician recommends that you continue on your current medications as directed. Please refer to the Current Medication list given to you today.  If you need a refill on your cardiac medications before your next appointment, please call your pharmacy.   Lab work: - none ordered  If you have labs (blood work) drawn today and your tests are completely normal, you will receive your results only by: Marland Kitchen MyChart Message (if you have MyChart) OR . A paper copy in the mail If you have any lab test that is abnormal or we need to change your treatment, we will call you to review the results.  Testing/Procedures: - none ordered  Follow-Up: At [facility], you and your health needs are our priority.  As part of our continuing mission to provide you with exceptional heart care, we have created designated Provider Care Teams.  These Care Teams include your primary Cardiologist (physician) and Advanced Practice Providers (APPs -  Physician Assistants and Nurse Practitioners) who all work together to provide you with the care you need, when you need it. . You will need a follow up appointment in 6 months (March 2021) with Dr. Caryl Comes. Marland Kitchen Please call our office 2 months in advance to schedule this appointment. (Call in early January to schedule)   Remote monitoring is used to monitor your Pacemaker of ICD from home. This monitoring reduces the number of office visits required to check your device to one time per year. It allows [place] to keep an eye on the functioning of your device to ensure it is working properly. You are scheduled for a device check from home on 12/11/18. You may send your transmission at any time that day. If you have a wireless device, the transmission will be sent automatically. After your physician reviews your transmission, you will receive a postcard with your next transmission date.   Any Other Special Instructions Will Be Listed Below (If Applicable). -  N/A

## 2018-09-28 DIAGNOSIS — Z23 Encounter for immunization: Secondary | ICD-10-CM | POA: Diagnosis not present

## 2018-10-11 DIAGNOSIS — Z96643 Presence of artificial hip joint, bilateral: Secondary | ICD-10-CM | POA: Diagnosis not present

## 2018-10-11 DIAGNOSIS — M7062 Trochanteric bursitis, left hip: Secondary | ICD-10-CM | POA: Diagnosis not present

## 2018-10-11 IMAGING — DX DG CHEST 2V
2 series · 2 of 2 positions shown · non-contrast
Comparison: 11/19/2015 .

CLINICAL DATA: Pacemaker.

EXAM:
CHEST  2 VIEW

[chest pa]
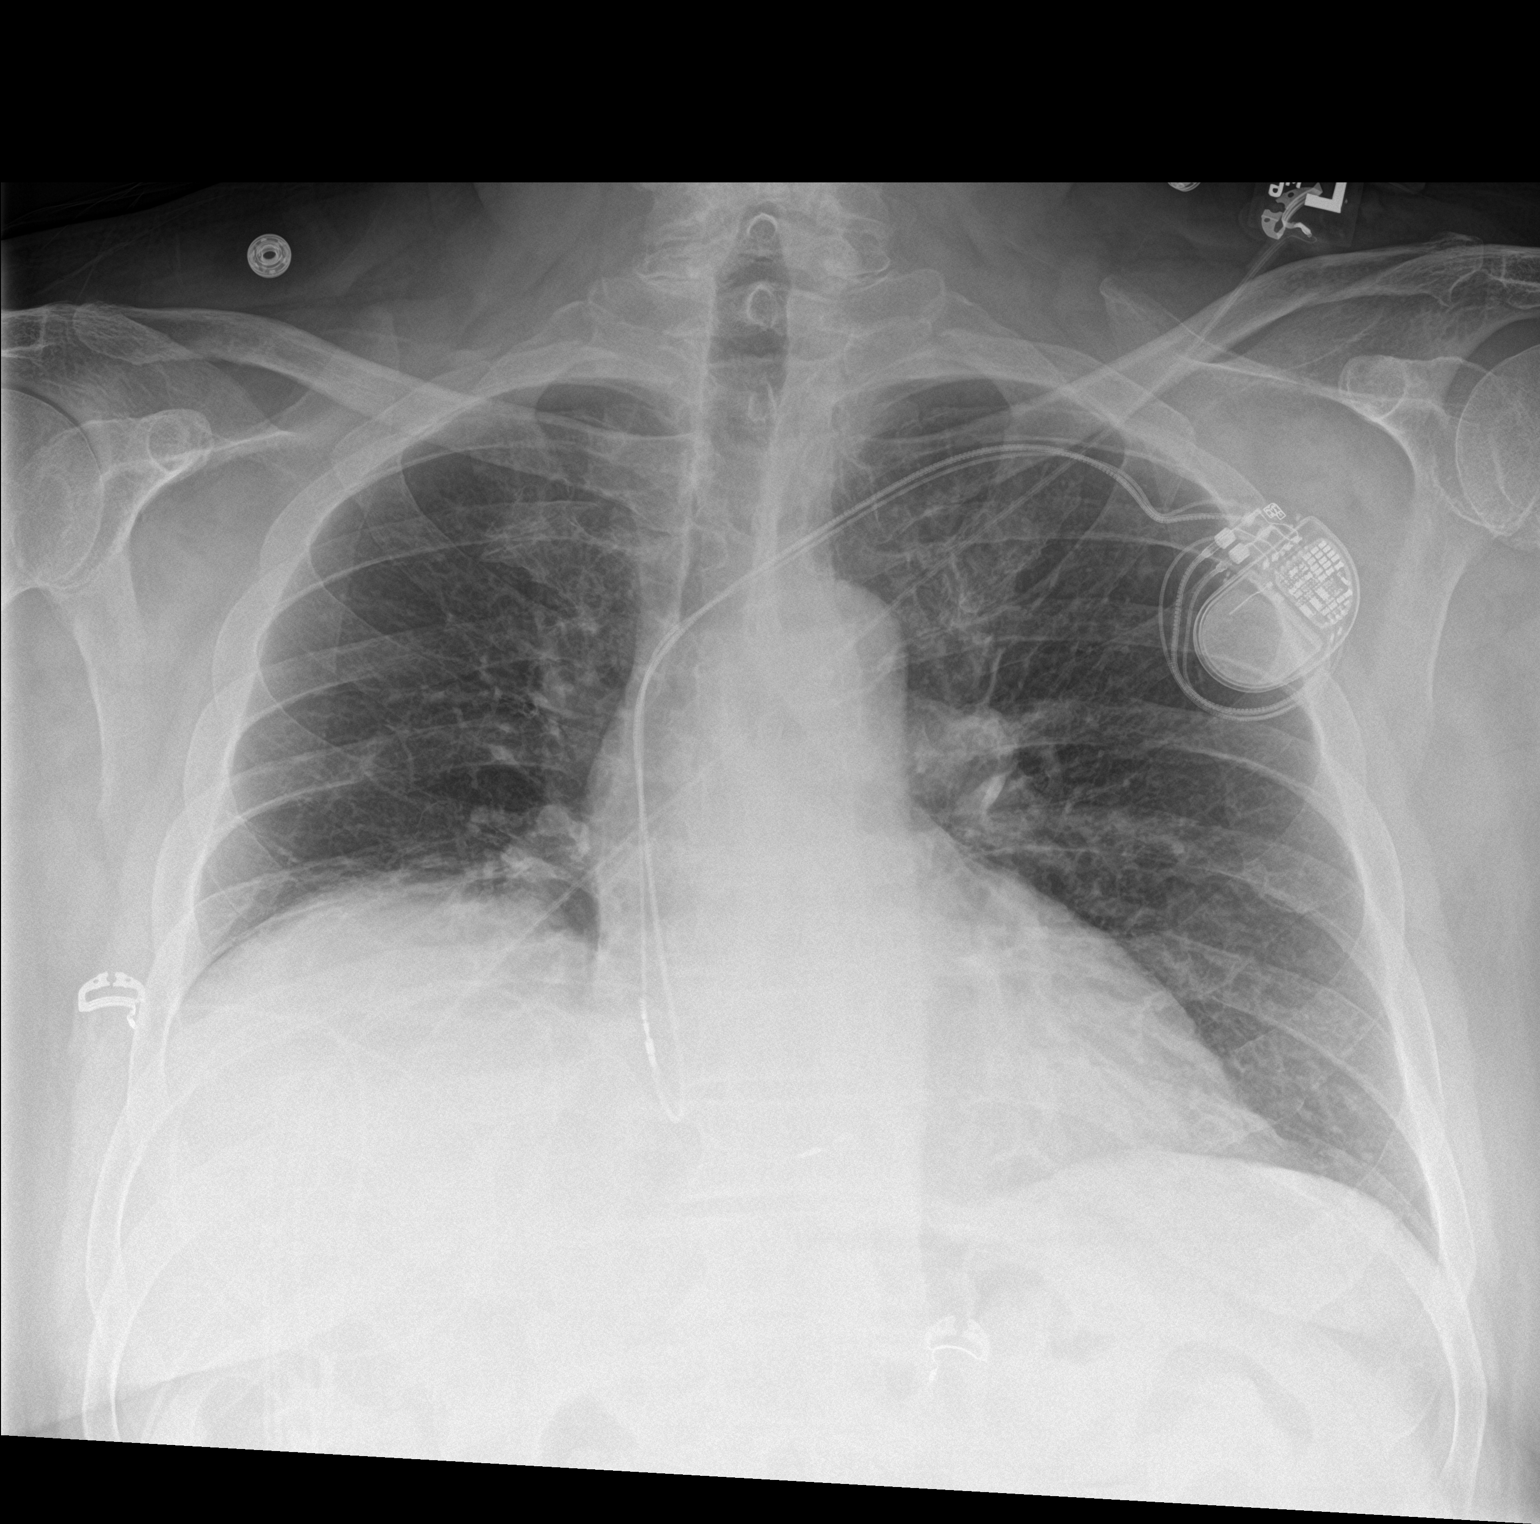

[chest lat]
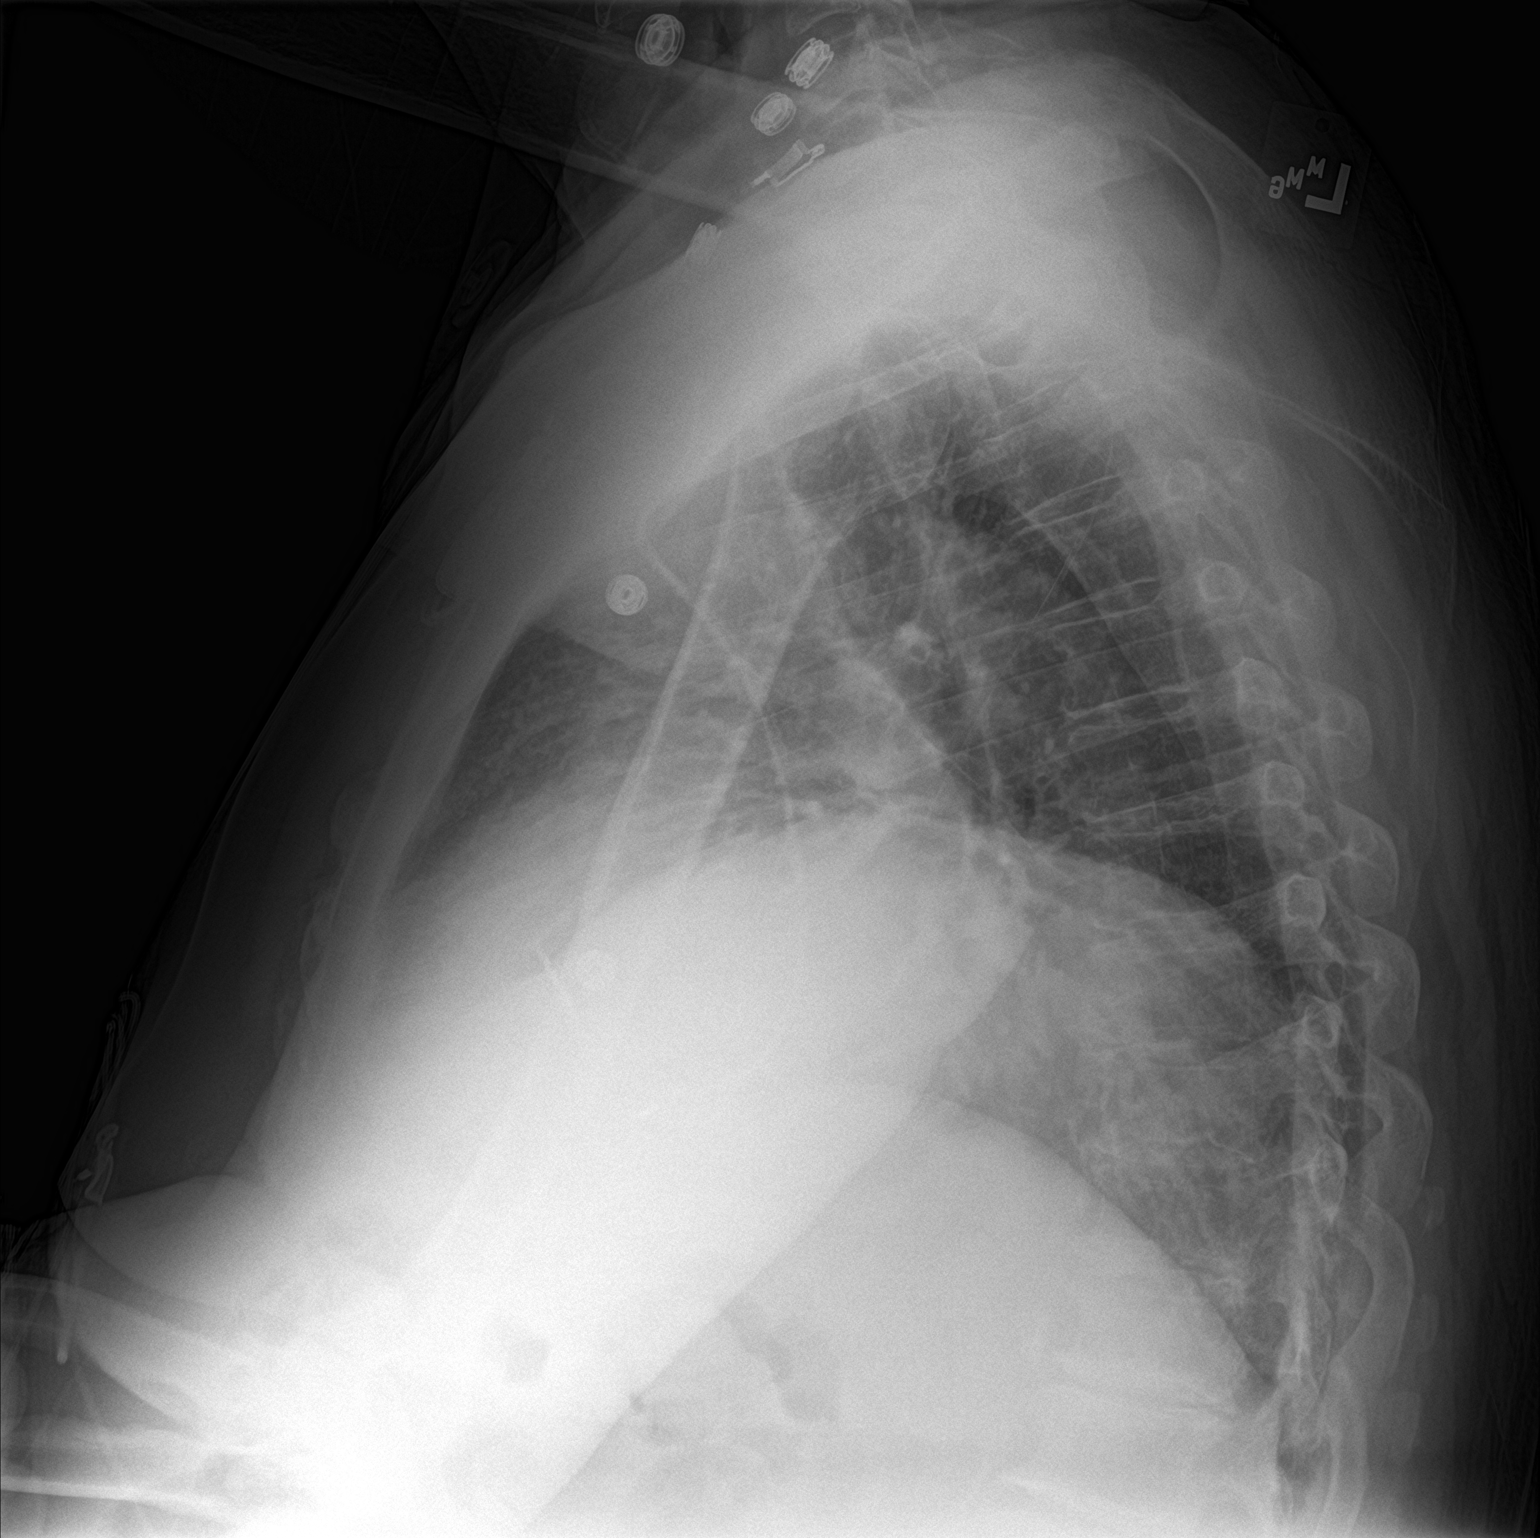

[2 of 2 positions shown; findings below may reference images not displayed]

FINDINGS: Cardiac pacer with lead tips in right atrium right ventricle.
Cardiomegaly with normal pulmonary vascularity. COPD . Low lung
volumes with basilar atelectasis. No pleural effusion or
pneumothorax
IMPRESSION: 1. Cardiac pacer with lead tips in right atrium right ventricle. No
pneumothorax. Stable cardiomegaly. No pulmonary venous congestion.

2. Low lung volumes with bibasilar atelectasis .  COPD .

## 2018-10-15 ENCOUNTER — Ambulatory Visit: Payer: Medicare Other | Admitting: Nurse Practitioner

## 2018-10-15 ENCOUNTER — Other Ambulatory Visit: Payer: Self-pay | Admitting: Internal Medicine

## 2018-10-18 ENCOUNTER — Encounter: Payer: Self-pay | Admitting: Nurse Practitioner

## 2018-10-18 ENCOUNTER — Other Ambulatory Visit: Payer: Self-pay

## 2018-10-18 ENCOUNTER — Ambulatory Visit (INDEPENDENT_AMBULATORY_CARE_PROVIDER_SITE_OTHER): Payer: Medicare Other | Admitting: Nurse Practitioner

## 2018-10-18 VITALS — BP 141/96 | HR 80 | Temp 99.2°F

## 2018-10-18 DIAGNOSIS — F419 Anxiety disorder, unspecified: Secondary | ICD-10-CM

## 2018-10-18 DIAGNOSIS — F339 Major depressive disorder, recurrent, unspecified: Secondary | ICD-10-CM | POA: Diagnosis not present

## 2018-10-18 DIAGNOSIS — G894 Chronic pain syndrome: Secondary | ICD-10-CM

## 2018-10-18 DIAGNOSIS — I10 Essential (primary) hypertension: Secondary | ICD-10-CM | POA: Diagnosis not present

## 2018-10-18 DIAGNOSIS — I951 Orthostatic hypotension: Secondary | ICD-10-CM

## 2018-10-18 DIAGNOSIS — Z0000 Encounter for general adult medical examination without abnormal findings: Secondary | ICD-10-CM | POA: Diagnosis not present

## 2018-10-18 DIAGNOSIS — I442 Atrioventricular block, complete: Secondary | ICD-10-CM | POA: Diagnosis not present

## 2018-10-18 NOTE — Assessment & Plan Note (Signed)
Chronic, ongoing.  Denies SI/HI.  Continue current medication regimen and adjust as needed.   

## 2018-10-18 NOTE — Progress Notes (Signed)
BP (!) 141/96 (BP Location: Left Arm, Cuff Size: Normal)   Pulse 80   Temp 99.2 F (37.3 C) (Oral)   SpO2 94%    Subjective:    Patient ID: Nicholas [name], male    DOB: [birth year], 73 y.o.   MRN: [id number]  HPI: Nicholas [name] is a 73 y.o. male presenting on 10/18/2018 for comprehensive medical examination. 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    HYPERTENSION / HYPERLIPIDEMIA Followed by Dr. Caryl Comes and last seen 09/24/2018.  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  Does endorse having occasional episodes over past year or so of dizziness when changing positions from sitting to standing.  Does not wear compression hose. Satisfied with current treatment? yes Duration of hypertension: chronic BP monitoring frequency: daily BP range: 124-136/72-88 BP medication side effects: no Duration of hyperlipidemia: chronic Cholesterol medication side effects: no Cholesterol supplements: fish oil Medication compliance: good compliance Aspirin: no Recent stressors: no Recurrent headaches: no Visual changes: no Palpitations: no Dyspnea: no Chest pain: no Lower extremity edema: no Dizzy/lightheaded: no   ANXIETY/STRESS Is on Effexor, Seroquel, and Ativan.  He reports some increase in stress recently, as son's family moved in with him.  Pt made aware of risks of benzo medication use to include increased sedation, respiratory suppression, falls, extrapyramidal movements, dependence and cardiovascular events.  Pt would like to continue treatment as benefit determined to outweigh risk.  Discussed with him that he is also on opioid therapy, he takes Ativan at night when he also takes his Norco and his Seroquel.  Discussed risks with taking these three medications together at same time. Duration:stable Anxious mood: yes  Excessive worrying:  no Irritability: no  Sweating: no Nausea: no Palpitations:no Hyperventilation: no Panic attacks: no Agoraphobia: no  Obscessions/compulsions: no Depressed mood: no Depression screen [facility] 2/9 08/14/2018 04/18/2018 02/25/2018 12/24/2017 10/30/2017  Decreased Interest 0 0 0 0 0  Down, Depressed, Hopeless 0 0 0 0 0  PHQ - 2 Score 0 0 0 0 0  Altered sleeping 1 - - - -  Tired, decreased energy 3 - - - -  Change in appetite 1 - - - -  Feeling bad or failure about yourself  2 - - - -  Trouble concentrating 0 - - - -  Moving slowly or fidgety/restless 0 - - - -  Suicidal thoughts 0 - - - -  PHQ-9 Score 7 - - - -  Difficult doing work/chores Not difficult at all - - - -  Some recent data might be hidden   Anhedonia: no Weight changes: no Insomnia: none Hypersomnia: no Fatigue/loss of energy: no Feelings of worthlessness: no Feelings of guilt: no Impaired concentration/indecisiveness: no Suicidal ideations: no  Crying spells: no Recent Stressors/Life Changes: yes   Relationship problems: no   Family stress: yes     Financial stress: no    Job stress: no    Recent death/loss: no  CHRONIC PAIN: Followed by pain clinic and is on chronic opioid therapy for his back pain.  Last seen 07/05/2018.  Takes Norco, one in the morning and one at night.  Is also taking his Ativan and Seroquel at night, discussed at length with him to change schedule on his medications and not to take these three medications together. He reports he is going to move  his second daily Norco to afternoon time.    Functional Status Survey: Is the patient deaf or have difficulty hearing?: No Does the patient have difficulty seeing, even when wearing glasses/contacts?: No Does the patient have difficulty concentrating, remembering, or making decisions?: No Does the patient have difficulty walking or climbing stairs?: No Does the patient have difficulty dressing or bathing?: No Does the patient have difficulty doing errands  alone such as visiting a doctor's office or shopping?: No  FALL RISK: Fall Risk  04/18/2018 02/25/2018 01/03/2018 12/24/2017 10/30/2017  Falls in the past year? 1 0 1 0 Yes  Number falls in past yr: 0 0 1 - 1  Comment - - - - -  Injury with Fall? 1 0 1 - Yes  Comment hit head, visited ED - - - tripped over a rope. LLE bruising, scabbing  Risk Factor Category  - - - - -  Risk for fall due to : - - History of fall(s) - Impaired balance/gait  Follow up - - Falls evaluation completed - Falls prevention discussed  Comment - - - - -    Depression Screen Depression screen [facility] 2/9 08/14/2018 04/18/2018 02/25/2018 12/24/2017 10/30/2017  Decreased Interest 0 0 0 0 0  Down, Depressed, Hopeless 0 0 0 0 0  PHQ - 2 Score 0 0 0 0 0  Altered sleeping 1 - - - -  Tired, decreased energy 3 - - - -  Change in appetite 1 - - - -  Feeling bad or failure about yourself  2 - - - -  Trouble concentrating 0 - - - -  Moving slowly or fidgety/restless 0 - - - -  Suicidal thoughts 0 - - - -  PHQ-9 Score 7 - - - -  Difficult doing work/chores Not difficult at all - - - -  Some recent data might be hidden    Advanced Directives <no information>  Past Medical History:  Past Medical History:  Diagnosis Date  . Anterior urethral stricture   . Anxiety   . Arthritis    a. knees, hips, hands;  b. 11/2013 s/p L TKA @ [place].  . Bile reflux gastritis   . Bulging lumbar disc   . BXO (balanitis xerotica obliterans)   . 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 Depression   . DVT (deep venous thrombosis) ([place])   . Erosive esophagitis   . Gross hematuria   . Hyperlipemia   . Hypertension    borderline  . Internal hemorrhoids   . Phimosis   . Pulmonary embolism [facility])     Surgical History:  Past Surgical History:  Procedure Laterality Date  . BUNIONECTOMY Bilateral 01/06/2015   Procedure: BUNIONECTOMY;  Surgeon: [name], MD;  Location: [facility] ORS;   Service: Orthopedics;  Laterality: Bilateral;  . CARDIAC CATHETERIZATION  ~ 2005   "once"  . CATARACT EXTRACTION W/ INTRAOCULAR LENS  IMPLANT, BILATERAL Bilateral ~ 2010  . COLONOSCOPY WITH PROPOFOL N/A 12/07/2015   Procedure: COLONOSCOPY WITH PROPOFOL;  Surgeon: Lollie Sails, MD;  Location: [facility] ENDOSCOPY;  Service: Endoscopy;  Laterality: N/A;  . EP IMPLANTABLE DEVICE N/A 02/16/2016   MDT dual chamber (His Bundle) pacemaker implanted by Dr Caryl Comes for intermittent complete heart block  . ESOPHAGOGASTRODUODENOSCOPY (EGD) WITH PROPOFOL N/A 12/07/2015   Procedure: ESOPHAGOGASTRODUODENOSCOPY (EGD) WITH PROPOFOL;  Surgeon: Lollie Sails, MD;  Location: [facility] ENDOSCOPY;  Service: Endoscopy;  Laterality:  N/A;  . ESOPHAGOGASTRODUODENOSCOPY (EGD) WITH PROPOFOL N/A 05/17/2017   Procedure: ESOPHAGOGASTRODUODENOSCOPY (EGD) WITH PROPOFOL;  Surgeon: Lollie Sails, MD;  Location[facility] ENDOSCOPY;  Service: Endoscopy;  Laterality: N/A;  . HAMMER TOE SURGERY Bilateral 01/06/2015   Procedure: HAMMER TOE CORRECTION;  Surgeon: [name], MD;  Location: [facility] ORS;  Service: Orthopedics;  Laterality: Bilateral;  . KNEE CARTILAGE SURGERY Left 1965   "football injury"  . Left Total Knee Arthroplasty     a. 11/2013 [facility].  Marland Kitchen PILONIDAL CYST EXCISION  1970's  . TOTAL HIP ARTHROPLASTY Right 2004  . TOTAL HIP ARTHROPLASTY Left 2006  . UPPER GI ENDOSCOPY      Medications:  Current Outpatient Medications on File Prior to Visit  Medication Sig  . acetaminophen (TYLENOL) 500 MG tablet Take 2,000 mg by mouth daily as needed for moderate pain or headache.  Marland Kitchen apixaban (ELIQUIS) 5 MG TABS tablet Take 1 tablet (5 mg total) by mouth 2 (two) times daily.  Marland Kitchen ascorbic acid (VITAMIN C) 250 MG tablet Take 250 mg by mouth daily.   . Cranberry 500 MG CAPS Take 1 capsule by mouth daily.   . Cyanocobalamin (B-12) 1000 MCG CAPS Take 1,000 mcg by mouth daily.   . diclofenac sodium (VOLTAREN) 1 % GEL diclofenac 1 % topical  gel  APPLY 2 GRAMS TO AFFECTED AREA 4 TIMES A DAY  . ferrous sulfate 325 (65 FE) MG tablet Take 325 mg by mouth daily with breakfast.  . flecainide (TAMBOCOR) 100 MG tablet Take 1 tablet (100 mg total) by mouth 2 (two) times daily.  Marland Kitchen gabapentin (NEURONTIN) 400 MG capsule Take 1 capsule (400 mg total) by mouth daily.  Marland Kitchen HYDROcodone-acetaminophen (NORCO/VICODIN) 5-325 MG tablet Take 1 tablet by mouth every 6 (six) hours as needed for moderate pain or severe pain.  . hyoscyamine (LEVSIN, ANASPAZ) 0.125 MG tablet Take 0.125 mg by mouth every 6 (six) hours as needed for bladder spasms or cramping.   Marland Kitchen LORazepam (ATIVAN) 1 MG tablet Take 1 tablet (1 mg total) by mouth at bedtime.  . Magnesium 400 MG TABS Take 400 mg by mouth daily.   . Melatonin 5 MG TABS Take 10 mg by mouth at bedtime.  . Multiple Vitamin (MULTI-VITAMINS) TABS Take 1 tablet by mouth daily.   . naproxen sodium (ALEVE) 220 MG tablet Take 220 mg by mouth.  . Omega-3 Fatty Acids (FISH OIL) 1200 MG CAPS Take 1,200 mg by mouth daily.  Marland Kitchen oxymetazoline (AFRIN) 0.05 % nasal spray Place 2 sprays into both nostrils at bedtime as needed for congestion.  . pantoprazole (PROTONIX) 40 MG tablet TAKE 1 TABLET (40 MG TOTAL) BY MOUTH ONCE DAILY TAKE 30 MINS BEFORE MEAL  . Probiotic Product ([place]) CAPS Take by mouth.  . QUEtiapine Fumarate (SEROQUEL XR) 150 MG 24 hr tablet Take 1 tablet (150 mg total) by mouth at bedtime.  . simvastatin (ZOCOR) 20 MG tablet Take 1 tablet (20 mg total) by mouth daily.  . solifenacin (VESICARE) 10 MG tablet Take 1 tablet (10 mg total) by mouth daily.  Marland Kitchen venlafaxine XR (EFFEXOR XR) 75 MG 24 hr capsule Take 3 capsules (225 mg total) by mouth daily.   No current facility-administered medications on file prior to visit.     Allergies:  No Known Allergies  Social History:  Social History   Socioeconomic History  . Marital status: Married    Spouse name: Not on file  . Number of children: Not on  file  .  Years of education: Not on file  . Highest education level: High school graduate  Occupational History  . Occupation: retired   Scientific laboratory technician  . Financial resource strain: Not hard at all  . Food insecurity    Worry: Never true    Inability: Never true  . Transportation needs    Medical: No    Non-medical: No  Tobacco Use  . Smoking status: Never Smoker  . Smokeless tobacco: Never Used  Substance and Sexual Activity  . Alcohol use: Yes    Alcohol/week: 0.0 standard drinks    Comment: 2 beers a month sometimes  . Drug use: No  . Sexual activity: Not Currently  Lifestyle  . Physical activity    Days per week: 0 days    Minutes per session: 0 min  . Stress: Not at all  Relationships  . Social connections    Talks on phone: More than three times a week    Gets together: More than three times a week    Attends religious service: More than 4 times per year    Active member of club or organization: Yes    Attends meetings of clubs or organizations: More than 4 times per year    Relationship status: Married  . Intimate partner violence    Fear of current or ex partner: No    Emotionally abused: No    Physically abused: No    Forced sexual activity: No  Other Topics Concern  . Not on file  Social History Narrative   Lives in [place] with wife.  Does not routinely exercise.  Activity severely limited by bilateral knee pain.   Social History   Tobacco Use  Smoking Status Never Smoker  Smokeless Tobacco Never Used   Social History   Substance and Sexual Activity  Alcohol Use Yes  . Alcohol/week: 0.0 standard drinks   Comment: 2 beers a month sometimes    Family History:  Family History  Problem Relation Age of Onset  . Stroke Father        deceased  . Diabetes Father   . Hypertension Father   . Alcoholism Mother        died in her 25's.  . Cancer Mother   . Glaucoma Sister   . Breast cancer Sister   . Prostate cancer Paternal Grandfather     Past  medical history, surgical history, medications, allergies, family history and social history reviewed with patient today and changes made to appropriate areas of the chart.   Review of Systems - negative All other ROS negative except what is listed above and in the HPI.      Objective:    BP (!) 141/96 (BP Location: Left Arm, Cuff Size: Normal)   Pulse 80   Temp 99.2 F (37.3 C) (Oral)   SpO2 94%   Wt Readings from Last 3 Encounters:  09/24/18 282 lb (127.9 kg)  04/18/18 280 lb (127 kg)  03/21/18 280 lb 9 oz (127.3 kg)    Physical Exam Vitals signs and nursing note reviewed.  Constitutional:      General: He is awake. He is not in acute distress.    Appearance: He is well-developed. He is obese. He is not ill-appearing.  HENT:     Head: Normocephalic and atraumatic.     Right Ear: Hearing, tympanic membrane, ear canal and external ear normal. No drainage.     Left Ear: Hearing, tympanic membrane, ear canal and external ear normal. No  drainage.     Nose: Nose normal.     Mouth/Throat:     Mouth: Mucous membranes are moist.     Pharynx: Oropharynx is clear. Uvula midline.  Eyes:     General: Lids are normal.        Right eye: No discharge.        Left eye: No discharge.     Extraocular Movements: Extraocular movements intact.     Conjunctiva/sclera: Conjunctivae normal.     Pupils: Pupils are equal, round, and reactive to light.     Visual Fields: Right eye visual fields normal and left eye visual fields normal.  Neck:     Musculoskeletal: Normal range of motion and neck supple.     Thyroid: No thyromegaly.     Vascular: No carotid bruit.  Cardiovascular:     Rate and Rhythm: Normal rate and regular rhythm.     Heart sounds: Normal heart sounds, S1 normal and S2 normal. No murmur. No gallop.   Pulmonary:     Effort: Pulmonary effort is normal. No accessory muscle usage or respiratory distress.     Breath sounds: Normal breath sounds.  Abdominal:     General: Bowel  sounds are normal.     Palpations: Abdomen is soft. There is no hepatomegaly or splenomegaly.     Tenderness: There is no abdominal tenderness.     Hernia: There is no hernia in the left inguinal area or right inguinal area.  Genitourinary:    Penis: Normal.      Scrotum/Testes: Normal.     Prostate: Normal.     Rectum: Normal.  Musculoskeletal: Normal range of motion.     Right lower leg: No edema.     Left lower leg: No edema.  Lymphadenopathy:     Cervical: No cervical adenopathy.  Skin:    General: Skin is warm and dry.     Capillary Refill: Capillary refill takes less than 2 seconds.     Findings: No rash.  Neurological:     Mental Status: He is alert and oriented to person, place, and time.     Cranial Nerves: Cranial nerves are intact.     Gait: Gait is intact.     Deep Tendon Reflexes: Reflexes are normal and symmetric.  Psychiatric:        Attention and Perception: Attention normal.        Mood and Affect: Mood normal.        Speech: Speech normal.        Behavior: Behavior normal. Behavior is cooperative.        Thought Content: Thought content normal.        Judgment: Judgment normal.     Results for orders placed or performed in visit on 09/11/18  CUP PACEART REMOTE DEVICE CHECK  Result Value Ref Range   Date Time Interrogation Session [id number]    Pulse Generator Manufacturer MERM    Pulse Gen Model [id number] Advisa DR MRI    Pulse Gen Serial Number [id number] [facility] Name [facility] Pulse Generator Type Implantable Pulse Generator    Implantable Pulse Generator Implant Date ZQ:6808901    Implantable Lead Manufacturer [facility]    Implantable Lead Model [id number] SelectSecure MRI SureScan    Implantable Lead Serial Number [id number]    Implantable Lead Implant Date ZQ:6808901    Implantable Lead Location Detail 1 UNKNOWN    Implantable Lead Special Function Bundle of His  Implantable Lead Location [id number]    Implantable Lead Manufacturer MERM     Implantable Lead Model 5076 CapSureFix Novus MRI SureScan    Implantable Lead Serial Number [id number]    Implantable Lead Implant Date ZQ:6808901    Implantable Lead Location Detail 1 APPENDAGE    Implantable Lead Location [phone]    Lead Channel Setting Sensing Sensitivity 0.9 mV   Lead Channel Setting Pacing Amplitude 2 V   Lead Channel Setting Pacing Pulse Width 1 ms   Lead Channel Setting Pacing Amplitude 2 V   Lead Channel Impedance Value 475 ohm   Lead Channel Impedance Value 380 ohm   Lead Channel Sensing Intrinsic Amplitude 4.625 mV   Lead Channel Sensing Intrinsic Amplitude 4.625 mV   Lead Channel Pacing Threshold Amplitude 0.625 V   Lead Channel Pacing Threshold Pulse Width 0.4 ms   Lead Channel Impedance Value 532 ohm   Lead Channel Impedance Value 361 ohm   Lead Channel Sensing Intrinsic Amplitude 1.625 mV   Lead Channel Sensing Intrinsic Amplitude 1.625 mV   Lead Channel Pacing Threshold Amplitude 0.75 V   Lead Channel Pacing Threshold Pulse Width 0.4 ms   Battery Status OK    Battery Remaining Longevity 69 mo   Battery Voltage 3 V   Brady Statistic RA Percent Paced 23.78 %   Brady Statistic RV Percent Paced 99.94 %   Brady Statistic AP VP Percent 23.8 %   Brady Statistic AS VP Percent 76.16 %   Brady Statistic AP VS Percent 0 %   Brady Statistic AS VS Percent 0.04 %      Assessment & Plan:   Problem List Items Addressed This Visit      Cardiovascular and Mediastinum   HTN (hypertension) (Chronic)    Chronic, ongoing.  Followed by cardiology.  BP slightly elevated in office today, but at goal at home.  With orthostatic BP presenting occasionally will continue current regimen at this time and have recommended utilizing compression hose at home (on during day and off at night).  Continue collaboration with cardiology.      Chronic orthostatic hypotension    Continue collaboration with cardiology.  Recommend ensuring good hydration throughout daytime hours + use of  compression hose at home (on during daytime and off during night).      Complete heart block ([place])    Followed by cardiology.  Pacemaker checks by them.         Other   Depression, recurrent (HCC)    Chronic, ongoing.  Denies SI/HI.  Continue current medication regimen and adjust as needed.      Relevant Orders   Referral to Chronic Care Management Services   Anxiety    Chronic, ongoing.  Discussed at length not to take Ativan at the same time he takes his Norco.  Lengthy discussion on long term risk of chronic benzo use.  Continue regimen at this time.  Provided him with [facility] ben[facility] risk information patient sheet.  CCM referral placed.        Chronic pain syndrome    Chronic, ongoing followed by pain clinic.  Discussed at length risk of benzo and opioid use in conjunction with each other.  Recommend he not take the two together at same hour during day or evening.        Relevant Medications   naproxen sodium (ALEVE) 220 MG tablet    Other Visit Diagnoses    Annual physical exam    -  Primary  Discussed aspirin prophylaxis for myocardial infarction prevention and decision was it was not indicated  LABORATORY TESTING:  Health maintenance labs ordered today as discussed above.   The natural history of prostate cancer and ongoing controversy regarding screening and potential treatment outcomes of prostate cancer has been discussed with the patient. The meaning of a false positive PSA and a false negative PSA has been discussed. He indicates understanding of the limitations of this screening test and wishes to proceed with screening PSA testing. Recently had labs.   IMMUNIZATIONS:   - Tdap: Tetanus vaccination status reviewed: last tetanus booster within 10 years. - Influenza: Up to date - Pneumovax: Up to date - Prevnar: Up to date - Zostavax vaccine: Refused  SCREENING: - Colonoscopy: Up to date  Discussed with patient purpose of the colonoscopy is to detect colon cancer  at curable precancerous or early stages   - AAA Screening: Not applicable  -Hearing Test: Not applicable  -Spirometry: Not applicable   PATIENT COUNSELING:    Sexuality: Discussed sexually transmitted diseases, partner selection, use of condoms, avoidance of unintended pregnancy  and contraceptive alternatives.   Advised to avoid cigarette smoking.  I discussed with the patient that most people either abstain from alcohol or drink within safe limits (<=14/week and <=4 drinks/occasion for males, <=7/weeks and <= 3 drinks/occasion for females) and that the risk for alcohol disorders and other health effects rises proportionally with the number of drinks per week and how often a drinker exceeds daily limits.  Discussed cessation/primary prevention of drug use and availability of treatment for abuse.   Diet: Encouraged to adjust caloric intake to maintain  or achieve ideal body weight, to reduce intake of dietary saturated fat and total fat, to limit sodium intake by avoiding high sodium foods and not adding table salt, and to maintain adequate dietary potassium and calcium preferably from fresh fruits, vegetables, and low-fat dairy products.    stressed the importance of regular exercise  Injury prevention: Discussed safety belts, safety helmets, smoke detector, smoking near bedding or upholstery.   Dental health: Discussed importance of regular tooth brushing, flossing, and dental visits.   Follow up plan: NEXT PREVENTATIVE PHYSICAL DUE IN 1 YEAR. Return for as scheduled in January.

## 2018-10-18 NOTE — Assessment & Plan Note (Signed)
Chronic, ongoing.  Followed by cardiology.  BP slightly elevated in office today, but at goal at home.  With orthostatic BP presenting occasionally will continue current regimen at this time and have recommended utilizing compression hose at home (on during day and off at night).  Continue collaboration with cardiology.

## 2018-10-18 NOTE — Assessment & Plan Note (Signed)
Continue collaboration with cardiology.  Recommend ensuring good hydration throughout daytime hours + use of compression hose at home (on during daytime and off during night).

## 2018-10-18 NOTE — Assessment & Plan Note (Signed)
Chronic, ongoing followed by pain clinic.  Discussed at length risk of benzo and opioid use in conjunction with each other.  Recommend he not take the two together at same hour during day or evening.

## 2018-10-18 NOTE — Patient Instructions (Signed)
Orthostatic Hypotension °Blood pressure is a measurement of how strongly, or weakly, your blood is pressing against the walls of your arteries. Orthostatic hypotension is a sudden drop in blood pressure that happens when you quickly change positions, such as when you get up from sitting or lying down. °Arteries are blood vessels that carry blood from your heart throughout your body. When blood pressure is too low, you may not get enough blood to your brain or to the rest of your organs. This can cause weakness, light-headedness, rapid heartbeat, and fainting. This can last for just a few seconds or for up to a few minutes. Orthostatic hypotension is usually not a serious problem. However, if it happens frequently or gets worse, it may be a sign of something more serious. °What are the causes? °This condition may be caused by: °· Sudden changes in posture, such as standing up quickly after you have been sitting or lying down. °· Blood loss. °· Loss of body fluids (dehydration). °· Heart problems. °· Hormone (endocrine) problems. °· Pregnancy. °· Severe infection. °· Lack of certain nutrients. °· Severe allergic reactions (anaphylaxis). °· Certain medicines, such as blood pressure medicine or medicines that make the body lose excess fluids (diuretics). Sometimes, this condition can be caused by not taking medicine as directed, such as taking too much of a certain medicine. °What increases the risk? °The following factors may make you more likely to develop this condition: °· Age. Risk increases as you get older. °· Conditions that affect the heart or the central nervous system. °· Taking certain medicines, such as blood pressure medicine or diuretics. °· Being pregnant. °What are the signs or symptoms? °Symptoms of this condition may include: °· Weakness. °· Light-headedness. °· Dizziness. °· Blurred vision. °· Fatigue. °· Rapid heartbeat. °· Fainting, in severe cases. °How is this diagnosed? °This condition is  diagnosed based on: °· Your medical history. °· Your symptoms. °· Your blood pressure measurement. Your health care provider will check your blood pressure when you are: °? Lying down. °? Sitting. °? Standing. °A blood pressure reading is recorded as two numbers, such as "120 over 80" (or 120/80). The first ("top") number is called the systolic pressure. It is a measure of the pressure in your arteries as your heart beats. The second ("bottom") number is called the diastolic pressure. It is a measure of the pressure in your arteries when your heart relaxes between beats. Blood pressure is measured in a unit called mm Hg. Healthy blood pressure for most adults is 120/80. If your blood pressure is below 90/60, you may be diagnosed with hypotension. °Other information or tests that may be used to diagnose orthostatic hypotension include: °· Your other vital signs, such as your heart rate and temperature. °· Blood tests. °· Tilt table test. For this test, you will be safely secured to a table that moves you from a lying position to an upright position. Your heart rhythm and blood pressure will be monitored during the test. °How is this treated? °This condition may be treated by: °· Changing your diet. This may involve eating more salt (sodium) or drinking more water. °· Taking medicines to raise your blood pressure. °· Changing the dosage of certain medicines you are taking that might be lowering your blood pressure. °· Wearing compression stockings. These stockings help to prevent blood clots and reduce swelling in your legs. °In some cases, you may need to go to the hospital for: °· Fluid replacement. This means you will   receive fluids through an IV. °· Blood replacement. This means you will receive donated blood through an IV (transfusion). °· Treating an infection or heart problems, if this applies. °· Monitoring. You may need to be monitored while medicines that you are taking wear off. °Follow these instructions  at home: °Eating and drinking ° °· Drink enough fluid to keep your urine pale yellow. °· Eat a healthy diet, and follow instructions from your health care provider about eating or drinking restrictions. A healthy diet includes: °? Fresh fruits and vegetables. °? Whole grains. °? Lean meats. °? Low-fat dairy products. °· Eat extra salt only as directed. Do not add extra salt to your diet unless your health care provider told you to do that. °· Eat frequent, small meals. °· Avoid standing up suddenly after eating. °Medicines °· Take over-the-counter and prescription medicines only as told by your health care provider. °? Follow instructions from your health care provider about changing the dosage of your current medicines, if this applies. °? Do not stop or adjust any of your medicines on your own. °General instructions ° °· Wear compression stockings as told by your health care provider. °· Get up slowly from lying down or sitting positions. This gives your blood pressure a chance to adjust. °· Avoid hot showers and excessive heat as directed by your health care provider. °· Return to your normal activities as told by your health care provider. Ask your health care provider what activities are safe for you. °· Do not use any products that contain nicotine or tobacco, such as cigarettes, e-cigarettes, and chewing tobacco. If you need help quitting, ask your health care provider. °· Keep all follow-up visits as told by your health care provider. This is important. °Contact a health care provider if you: °· Vomit. °· Have diarrhea. °· Have a fever for more than 2-3 days. °· Feel more thirsty than usual. °· Feel weak and tired. °Get help right away if you: °· Have chest pain. °· Have a fast or irregular heartbeat. °· Develop numbness in any part of your body. °· Cannot move your arms or your legs. °· Have trouble speaking. °· Become sweaty or feel light-headed. °· Faint. °· Feel short of breath. °· Have trouble staying  awake. °· Feel confused. °Summary °· Orthostatic hypotension is a sudden drop in blood pressure that happens when you quickly change positions. °· Orthostatic hypotension is usually not a serious problem. °· It is diagnosed by having your blood pressure taken lying down, sitting, and then standing. °· It may be treated by changing your diet or adjusting your medicines. °This information is not intended to replace advice given to you by your health care provider. Make sure you discuss any questions you have with your health care provider. °Document Released: 12/30/2001 Document Revised: 07/05/2017 Document Reviewed: 07/05/2017 °Elsevier Patient Education © 2020 [employer] ° °

## 2018-10-18 NOTE — Assessment & Plan Note (Signed)
Followed by cardiology.  Pacemaker checks by them.

## 2018-10-18 NOTE — Assessment & Plan Note (Addendum)
Chronic, ongoing.  Discussed at length not to take Ativan at the same time he takes his Norco.  Lengthy discussion on long term risk of chronic benzo use.  Continue regimen at this time.  Provided him with [facility] benzo risk information patient sheet.  CCM referral placed.

## 2018-10-29 LAB — CUP PACEART INCLINIC DEVICE CHECK
Battery Remaining Longevity: 66 mo
Battery Voltage: 3 V
Brady Statistic AP VP Percent: 22.15 %
Brady Statistic AP VS Percent: 0 %
Brady Statistic AS VP Percent: 77.8 %
Brady Statistic AS VS Percent: 0.04 %
Brady Statistic RA Percent Paced: 22.13 %
Brady Statistic RV Percent Paced: 99.93 %
Date Time Interrogation Session: 20200901183712
Implantable Lead Implant Date: 20180124
Implantable Lead Implant Date: 20180124
Implantable Lead Location: 753859
Implantable Lead Location: 753860
Implantable Lead Model: 3830
Implantable Lead Model: 5076
Implantable Pulse Generator Implant Date: 20180124
Lead Channel Impedance Value: 342 Ohm
Lead Channel Impedance Value: 380 Ohm
Lead Channel Impedance Value: 456 Ohm
Lead Channel Impedance Value: 513 Ohm
Lead Channel Pacing Threshold Amplitude: 0.5 V
Lead Channel Pacing Threshold Amplitude: 1 V
Lead Channel Pacing Threshold Pulse Width: 0.4 ms
Lead Channel Pacing Threshold Pulse Width: 1 ms
Lead Channel Sensing Intrinsic Amplitude: 1.625 mV
Lead Channel Sensing Intrinsic Amplitude: 4.25 mV
Lead Channel Setting Pacing Amplitude: 2 V
Lead Channel Setting Pacing Amplitude: 2.25 V
Lead Channel Setting Pacing Pulse Width: 1 ms
Lead Channel Setting Sensing Sensitivity: 0.9 mV

## 2018-11-06 ENCOUNTER — Other Ambulatory Visit: Payer: Self-pay | Admitting: Family Medicine

## 2018-11-06 DIAGNOSIS — F419 Anxiety disorder, unspecified: Secondary | ICD-10-CM

## 2018-11-06 DIAGNOSIS — F339 Major depressive disorder, recurrent, unspecified: Secondary | ICD-10-CM

## 2018-11-06 NOTE — Telephone Encounter (Signed)
Requested medication (s) are due for refill today: yes  Requested medication (s) are on the active medication list: yes  Last refill:  10/10/2018  Future visit scheduled: yes  Notes to clinic:  Refill cannot be delegated    Requested Prescriptions  Pending Prescriptions Disp Refills   QUEtiapine Fumarate (SEROQUEL XR) 150 MG 24 hr tablet [Pharmacy Med Name: QUETIAPINE ER 150 MG TABLET] 90 tablet 5    Sig: Take 1 tablet (150 mg total) by mouth at bedtime.     Not Delegated - Psychiatry:  Antipsychotics - Second Generation (Atypical) - quetiapine Failed - 11/06/2018 12:31 PM      Failed - This refill cannot be delegated      Failed - Last BP in normal range    BP Readings from Last 1 Encounters:  10/18/18 (!) 141/96         Passed - ALT in normal range and within 180 days    ALT  Date Value Ref Range Status  07/16/2018 15 0 - 44 IU/L Final   SGPT (ALT)  Date Value Ref Range Status  04/07/2013 22 12 - 78 U/L Final   ALT (SGPT) Piccolo, Waived  Date Value Ref Range Status  01/03/2018 24 10 - 47 U/L Final         Passed - AST in normal range and within 180 days    AST  Date Value Ref Range Status  07/16/2018 27 0 - 40 IU/L Final   SGOT(AST)  Date Value Ref Range Status  04/07/2013 28 15 - 37 Unit/L Final   AST (SGOT) Piccolo, Waived  Date Value Ref Range Status  01/03/2018 34 11 - 38 U/L Final         Passed - Valid encounter within last 6 months    Recent Outpatient Visits          2 weeks ago Annual physical exam   [place], [name], NP   3 months ago Depression, recurrent ([place])   [place] Crissman, Jeannette How, MD   7 months ago Lower respiratory infection   [place], [place], [place]   10 months ago Essential hypertension   [place] Crissman, Jeannette How, MD   1 year ago Essential hypertension   [place], Jeannette How, MD      Future Appointments            In 3 months Crissman, Jeannette How, MD [place], [facility]           Passed - Completed PHQ-2 or PHQ-9 in the last 360 days.

## 2018-11-12 ENCOUNTER — Ambulatory Visit: Payer: Medicare Other | Attending: Anesthesiology | Admitting: Anesthesiology

## 2018-11-12 ENCOUNTER — Encounter: Payer: Self-pay | Admitting: Anesthesiology

## 2018-11-12 ENCOUNTER — Other Ambulatory Visit: Payer: Self-pay

## 2018-11-12 DIAGNOSIS — G894 Chronic pain syndrome: Secondary | ICD-10-CM

## 2018-11-12 DIAGNOSIS — M5136 Other intervertebral disc degeneration, lumbar region: Secondary | ICD-10-CM | POA: Diagnosis not present

## 2018-11-12 DIAGNOSIS — F1190 Opioid use, unspecified, uncomplicated: Secondary | ICD-10-CM | POA: Diagnosis not present

## 2018-11-12 DIAGNOSIS — M47817 Spondylosis without myelopathy or radiculopathy, lumbosacral region: Secondary | ICD-10-CM

## 2018-11-12 DIAGNOSIS — M79641 Pain in right hand: Secondary | ICD-10-CM

## 2018-11-12 DIAGNOSIS — M5416 Radiculopathy, lumbar region: Secondary | ICD-10-CM

## 2018-11-12 DIAGNOSIS — M1611 Unilateral primary osteoarthritis, right hip: Secondary | ICD-10-CM

## 2018-11-12 DIAGNOSIS — M47816 Spondylosis without myelopathy or radiculopathy, lumbar region: Secondary | ICD-10-CM

## 2018-11-12 DIAGNOSIS — M79642 Pain in left hand: Secondary | ICD-10-CM

## 2018-11-12 DIAGNOSIS — M170 Bilateral primary osteoarthritis of knee: Secondary | ICD-10-CM

## 2018-11-12 MED ORDER — HYDROCODONE-ACETAMINOPHEN 5-325 MG PO TABS: 1.0000 | ORAL_TABLET | Freq: Four times a day (QID) | ORAL | 0 refills | Status: AC | PRN

## 2018-11-12 MED ORDER — HYDROCODONE-ACETAMINOPHEN 5-325 MG PO TABS: 1.0000 | ORAL_TABLET | Freq: Two times a day (BID) | ORAL | 0 refills | Status: DC

## 2018-11-12 NOTE — Progress Notes (Signed)
Virtual Visit via Telephone Note  I connected with Nicholas [name] on 11/12/18 at  3:00 PM EDT by telephone and verified that I am speaking with the correct person using two identifiers.  Location: Patient: Home Provider: Pain control center   I discussed the limitations, risks, security and privacy concerns of performing an evaluation and management service by telephone and the availability of in person appointments. I also discussed with the patient that there may be a patient responsible charge related to this service. The patient expressed understanding and agreed to proceed.   History of Present Illness: I spoke with Nicholas [name] today and he has been doing well.  The medications are working well for him and keeping his pain under good control.  He is actually been able to get out and play some golf.  The meeting today was virtual.  He states that this is working well for him and he denies any change in his low back pain or problems with its management.  The medications continue to be effective and he is having no side effects.  He is sleeping well and able to be active with them.  Otherwise he is in his usual state of health with no new changes mentioned today.    Observations/Objective:  Current Outpatient Medications:  .  acetaminophen (TYLENOL) 500 MG tablet, Take 2,000 mg by mouth daily as needed for moderate pain or headache., Disp: , Rfl:  .  apixaban (ELIQUIS) 5 MG TABS tablet, Take 1 tablet (5 mg total) by mouth 2 (two) times daily., Disp: 180 tablet, Rfl: 4 .  ascorbic acid (VITAMIN C) 250 MG tablet, Take 250 mg by mouth daily. , Disp: , Rfl:  .  Cranberry 500 MG CAPS, Take 1 capsule by mouth daily. , Disp: , Rfl:  .  Cyanocobalamin (B-12) 1000 MCG CAPS, Take 1,000 mcg by mouth daily. , Disp: , Rfl:  .  diclofenac sodium (VOLTAREN) 1 % GEL, diclofenac 1 % topical gel  APPLY 2 GRAMS TO AFFECTED AREA 4 TIMES A DAY, Disp: , Rfl:  .  ferrous sulfate 325 (65 FE) MG tablet, Take 325 mg  by mouth daily with breakfast., Disp: , Rfl:  .  flecainide (TAMBOCOR) 100 MG tablet, Take 1 tablet (100 mg total) by mouth 2 (two) times daily., Disp: 180 tablet, Rfl: 3 .  gabapentin (NEURONTIN) 400 MG capsule, Take 1 capsule (400 mg total) by mouth daily., Disp: 90 capsule, Rfl: 3 .  [START ON 11/16/2018] HYDROcodone-acetaminophen (NORCO/VICODIN) 5-325 MG tablet, Take 1 tablet by mouth every 6 (six) hours as needed for moderate pain or severe pain., Disp: 60 tablet, Rfl: 0 .  [START ON 12/16/2018] HYDROcodone-acetaminophen (NORCO/VICODIN) 5-325 MG tablet, Take 1 tablet by mouth 2 (two) times daily., Disp: 60 tablet, Rfl: 0 .  hyoscyamine (LEVSIN, ANASPAZ) 0.125 MG tablet, Take 0.125 mg by mouth every 6 (six) hours as needed for bladder spasms or cramping. , Disp: , Rfl:  .  LORazepam (ATIVAN) 1 MG tablet, Take 1 tablet (1 mg total) by mouth at bedtime., Disp: 90 tablet, Rfl: 1 .  Magnesium 400 MG TABS, Take 400 mg by mouth daily. , Disp: , Rfl:  .  Melatonin 5 MG TABS, Take 10 mg by mouth at bedtime., Disp: , Rfl:  .  Multiple Vitamin (MULTI-VITAMINS) TABS, Take 1 tablet by mouth daily. , Disp: , Rfl:  .  naproxen sodium (ALEVE) 220 MG tablet, Take 220 mg by mouth., Disp: , Rfl:  .  Omega-3  Fatty Acids (FISH OIL) 1200 MG CAPS, Take 1,200 mg by mouth daily., Disp: , Rfl:  .  oxymetazoline (AFRIN) 0.05 % nasal spray, Place 2 sprays into both nostrils at bedtime as needed for congestion., Disp: , Rfl:  .  pantoprazole (PROTONIX) 40 MG tablet, TAKE 1 TABLET (40 MG TOTAL) BY MOUTH ONCE DAILY TAKE 30 MINS BEFORE MEAL, Disp: , Rfl: 6 .  Probiotic Product ([place]) CAPS, Take by mouth., Disp: , Rfl:  .  QUEtiapine Fumarate (SEROQUEL XR) 150 MG 24 hr tablet, Take 1 tablet (150 mg total) by mouth at bedtime., Disp: 90 tablet, Rfl: 3 .  simvastatin (ZOCOR) 20 MG tablet, Take 1 tablet (20 mg total) by mouth daily., Disp: 90 tablet, Rfl: 4 .  solifenacin (VESICARE) 10 MG tablet, Take 1 tablet  (10 mg total) by mouth daily., Disp: 90 tablet, Rfl: 4 .  venlafaxine XR (EFFEXOR XR) 75 MG 24 hr capsule, Take 3 capsules (225 mg total) by mouth daily., Disp: 270 capsule, Rfl: 4  Assessment and Plan: 1. Lumbar radiculopathy   2. Degeneration of lumbar intervertebral disc   3. Chronic, continuous use of opioids   4. Chronic pain syndrome   5. Bilateral hand pain   6. Facet syndrome, lumbar   7. Osteoarthritis of right hip, unspecified osteoarthritis type   8. Facet arthritis of lumbosacral region   9. Primary osteoarthritis of both knees   Based on our discussion today and upon review of the [place] practitioner database information going to refill his medications for the next 2 months dated for October 24 and November 23.  We will schedule him for return to clinic in 2 months.  He is instructed to contact us at the pain control center should he have any problems with his low back pain or changes in its management or symptom complex and continue follow-up with his primary care physicians for his baseline medical care.   Follow Up Instructions:    I discussed the assessment and treatment plan with the patient. The patient was provided an opportunity to ask questions and all were answered. The patient agreed with the plan and demonstrated an understanding of the instructions.   The patient was advised to call back or seek an in-person evaluation if the symptoms worsen or if the condition fails to improve as anticipated.  I provided 30 minutes of non-face-to-face time during this encounter.   [name], MD

## 2018-11-16 ENCOUNTER — Other Ambulatory Visit: Payer: Self-pay | Admitting: Family Medicine

## 2018-11-27 ENCOUNTER — Ambulatory Visit: Payer: Medicare Other | Admitting: Pharmacist

## 2018-11-27 DIAGNOSIS — G894 Chronic pain syndrome: Secondary | ICD-10-CM

## 2018-11-27 NOTE — Chronic Care Management (AMB) (Signed)
  Chronic Care Management   Note  11/27/2018 Name: Nicholas [name] MRN: [id number] DOB: 08/16/1945  Nicholas [name] is a 73 y.o. year old male who is a primary care patient of Crissman, Jeannette How, MD. The CCM team was consulted for assistance with chronic disease management and care coordination needs.    Attempted to contact patient to discuss CCM team and care management needs. Left HIPAA compliant message for patient to return my call at his convenience.   Follow up plan: - If I don't hear back, will outreach again in the next 4-6 weeks  Catie Darnelle Maffucci, PharmD Clinical Pharmacist [place] [phone]

## 2018-12-09 ENCOUNTER — Other Ambulatory Visit: Payer: Self-pay | Admitting: Family Medicine

## 2018-12-09 MED ORDER — GABAPENTIN 400 MG PO CAPS: 400.0000 mg | ORAL_CAPSULE | Freq: Every day | ORAL | 1 refills | Status: DC

## 2018-12-09 NOTE — Telephone Encounter (Signed)
Medication Refill - Medication: gabapentin (NEURONTIN) 400 MG capsule   Has the patient contacted their pharmacy? Yes.   (Agent: If no, request that the patient contact the pharmacy for the refill.) (Agent: If yes, when and what did the pharmacy advise?)  Preferred Pharmacy (with phone number or street name): [id number] - [place], [address]  [address] [place] [address]  Phone: [phone] Fax: [phone]      Agent: Please be advised that RX refills may take up to 3 business days. We ask that you follow-up with your pharmacy.

## 2018-12-11 ENCOUNTER — Ambulatory Visit (INDEPENDENT_AMBULATORY_CARE_PROVIDER_SITE_OTHER): Payer: Medicare Other | Admitting: *Deleted

## 2018-12-11 DIAGNOSIS — I442 Atrioventricular block, complete: Secondary | ICD-10-CM | POA: Diagnosis not present

## 2018-12-11 LAB — CUP PACEART REMOTE DEVICE CHECK
Battery Remaining Longevity: 65 mo
Battery Voltage: 3 V
Brady Statistic AP VP Percent: 22.62 %
Brady Statistic AP VS Percent: 0 %
Brady Statistic AS VP Percent: 77.36 %
Brady Statistic AS VS Percent: 0.02 %
Brady Statistic RA Percent Paced: 22.6 %
Brady Statistic RV Percent Paced: 99.98 %
Date Time Interrogation Session: 20201118103959
Implantable Lead Implant Date: 20180124
Implantable Lead Implant Date: 20180124
Implantable Lead Location: 753859
Implantable Lead Location: 753860
Implantable Lead Model: 3830
Implantable Lead Model: 5076
Implantable Pulse Generator Implant Date: 20180124
Lead Channel Impedance Value: 361 Ohm
Lead Channel Impedance Value: 361 Ohm
Lead Channel Impedance Value: 399 Ohm
Lead Channel Impedance Value: 494 Ohm
Lead Channel Pacing Threshold Amplitude: 0.5 V
Lead Channel Pacing Threshold Amplitude: 0.75 V
Lead Channel Pacing Threshold Pulse Width: 0.4 ms
Lead Channel Pacing Threshold Pulse Width: 0.4 ms
Lead Channel Sensing Intrinsic Amplitude: 1.625 mV
Lead Channel Sensing Intrinsic Amplitude: 1.625 mV
Lead Channel Sensing Intrinsic Amplitude: 4 mV
Lead Channel Sensing Intrinsic Amplitude: 4 mV
Lead Channel Setting Pacing Amplitude: 2 V
Lead Channel Setting Pacing Amplitude: 2.25 V
Lead Channel Setting Pacing Pulse Width: 1 ms
Lead Channel Setting Sensing Sensitivity: 0.9 mV

## 2018-12-27 ENCOUNTER — Encounter: Payer: Self-pay | Admitting: Pharmacist

## 2018-12-27 ENCOUNTER — Ambulatory Visit: Payer: Medicare Other | Admitting: Pharmacist

## 2018-12-27 NOTE — Chronic Care Management (AMB) (Signed)
  Chronic Care Management   Note  12/27/2018 Name: BRYSAN MCEVOY MRN: [id number] DOB: [birth year]   Subjective:  Nicholas [name] is a 73 y.o. year old male who is a primary care patient of Crissman, Jeannette How, MD. The CCM team was consulted for assistance with chronic disease management and care coordination needs.     Mr. Rosamond was given information about Chronic Care Management services today including:  1. CCM service includes personalized support from designated clinical staff supervised by his physician, including individualized plan of care and coordination with other care providers 2. 24/7 contact phone numbers for assistance for urgent and routine care needs. 3. Service will only be billed when office clinical staff spend 20 minutes or more in a month to coordinate care. 4. Only one practitioner may furnish and bill the service in a calendar month. 5. The patient may stop CCM services at any time (effective at the end of the month) by phone call to the office staff. 6. The patient will be responsible for cost sharing (co-pay) of up to 20% of the service fee (after annual deductible is met).  Patient agreed to services and verbal consent obtained.   We discussed doing a medication review, but he was in the car going out of town. We scheduled a phone visit on 01/28/2018 at 3:30 pm for a full medication review with him and his wife. I will send him this information and my contact information in a MyChart message as requested  Plan: - Scheduled phone visit in ~4 weeks  Catie Darnelle Maffucci, PharmD, [place] [phone]

## 2018-12-27 NOTE — Patient Instructions (Signed)
Visit Information  Mr. Knickerbocker was given information about Chronic Care Management services today including:  1. CCM service includes personalized support from designated clinical staff supervised by his physician, including individualized plan of care and coordination with other care providers 2. 24/7 contact phone numbers for assistance for urgent and routine care needs. 3. Service will only be billed when office clinical staff spend 20 minutes or more in a month to coordinate care. 4. Only one practitioner may furnish and bill the service in a calendar month. 5. The patient may stop CCM services at any time (effective at the end of the month) by phone call to the office staff. 6. The patient will be responsible for cost sharing (co-pay) of up to 20% of the service fee (after annual deductible is met).  Patient agreed to services and verbal consent obtained.   The patient verbalized understanding of instructions provided today and declined a print copy of patient instruction materials.   Plan: - Scheduled phone visit in ~4 weeks  Catie Darnelle Maffucci, PharmD, [place] [phone]

## 2018-12-30 DIAGNOSIS — H26493 Other secondary cataract, bilateral: Secondary | ICD-10-CM | POA: Diagnosis not present

## 2018-12-30 DIAGNOSIS — H353131 Nonexudative age-related macular degeneration, bilateral, early dry stage: Secondary | ICD-10-CM | POA: Diagnosis not present

## 2018-12-30 DIAGNOSIS — Z961 Presence of intraocular lens: Secondary | ICD-10-CM | POA: Diagnosis not present

## 2018-12-30 DIAGNOSIS — H35372 Puckering of macula, left eye: Secondary | ICD-10-CM | POA: Diagnosis not present

## 2019-01-01 ENCOUNTER — Other Ambulatory Visit: Payer: Self-pay | Admitting: Nurse Practitioner

## 2019-01-01 DIAGNOSIS — F419 Anxiety disorder, unspecified: Secondary | ICD-10-CM

## 2019-01-01 DIAGNOSIS — F339 Major depressive disorder, recurrent, unspecified: Secondary | ICD-10-CM

## 2019-01-01 NOTE — Telephone Encounter (Signed)
Last seen:10/18/18

## 2019-01-01 NOTE — Telephone Encounter (Signed)
Requested medication (s) are due for refill today: yes  Requested medication (s) are on the active medication list: yes  Last refill:  12/08/2018  Future visit scheduled: yes  Notes to clinic:  Requesting a 90 day supply   Requested Prescriptions  Pending Prescriptions Disp Refills   QUEtiapine Fumarate (SEROQUEL XR) 150 MG 24 hr tablet [Pharmacy Med Name: QUETIAPINE ER 150 MG TABLET] 90 tablet 4    Sig: Take 1 tablet (150 mg total) by mouth at bedtime.     Not Delegated - Psychiatry:  Antipsychotics - Second Generation (Atypical) - quetiapine Failed - 01/01/2019  1:32 PM      Failed - This refill cannot be delegated      Failed - Last BP in normal range    BP Readings from Last 1 Encounters:  10/18/18 (!) 141/96         Passed - ALT in normal range and within 180 days    ALT  Date Value Ref Range Status  07/16/2018 15 0 - 44 IU/L Final   SGPT (ALT)  Date Value Ref Range Status  04/07/2013 22 12 - 78 U/L Final   ALT (SGPT) Piccolo, Waived  Date Value Ref Range Status  01/03/2018 24 10 - 47 U/L Final         Passed - AST in normal range and within 180 days    AST  Date Value Ref Range Status  07/16/2018 27 0 - 40 IU/L Final   SGOT(AST)  Date Value Ref Range Status  04/07/2013 28 15 - 37 Unit/L Final   AST (SGOT) Piccolo, Waived  Date Value Ref Range Status  01/03/2018 34 11 - 38 U/L Final         Passed - Valid encounter within last 6 months    Recent Outpatient Visits          2 months ago Annual physical exam   [place], [name], NP   5 months ago Depression, recurrent [facility])   [facility] Crissman, Jeannette How, MD   9 months ago Lower respiratory infection   [facility], [place], [place]   12 months ago Essential hypertension   [place] Crissman, Jeannette How, MD   1 year ago Essential hypertension   [place], Jeannette How, MD      Future Appointments            In 1 month Crissman, Jeannette How, MD [place], [facility]           Passed - Completed PHQ-2 or PHQ-9 in the last 360 days.

## 2019-01-07 ENCOUNTER — Other Ambulatory Visit: Payer: Self-pay

## 2019-01-07 ENCOUNTER — Encounter: Payer: Self-pay | Admitting: Anesthesiology

## 2019-01-07 ENCOUNTER — Ambulatory Visit: Payer: Medicare Other | Attending: Anesthesiology | Admitting: Anesthesiology

## 2019-01-07 DIAGNOSIS — M5136 Other intervertebral disc degeneration, lumbar region: Secondary | ICD-10-CM

## 2019-01-07 DIAGNOSIS — M1611 Unilateral primary osteoarthritis, right hip: Secondary | ICD-10-CM

## 2019-01-07 DIAGNOSIS — M5416 Radiculopathy, lumbar region: Secondary | ICD-10-CM

## 2019-01-07 DIAGNOSIS — G894 Chronic pain syndrome: Secondary | ICD-10-CM

## 2019-01-07 DIAGNOSIS — M79641 Pain in right hand: Secondary | ICD-10-CM

## 2019-01-07 DIAGNOSIS — M47816 Spondylosis without myelopathy or radiculopathy, lumbar region: Secondary | ICD-10-CM

## 2019-01-07 DIAGNOSIS — F1190 Opioid use, unspecified, uncomplicated: Secondary | ICD-10-CM

## 2019-01-07 DIAGNOSIS — M79642 Pain in left hand: Secondary | ICD-10-CM

## 2019-01-07 MED ORDER — HYDROCODONE-ACETAMINOPHEN 5-325 MG PO TABS: 1.0000 | ORAL_TABLET | Freq: Two times a day (BID) | ORAL | 0 refills | Status: AC

## 2019-01-07 MED ORDER — HYDROCODONE-ACETAMINOPHEN 5-325 MG PO TABS: 1.0000 | ORAL_TABLET | Freq: Two times a day (BID) | ORAL | 0 refills | Status: DC

## 2019-01-07 NOTE — Progress Notes (Signed)
Virtual Visit via Telephone Note  I connected with Nicholas [name] on 01/07/19 at  1:30 PM EST by telephone and verified that I am speaking with the correct person using two identifiers.  Location: Patient: Home Provider: Pain control center   I discussed the limitations, risks, security and privacy concerns of performing an evaluation and management service by telephone and the availability of in person appointments. I also discussed with the patient that there may be a patient responsible charge related to this service. The patient expressed understanding and agreed to proceed.   History of Present Illness: I spoke with Nicholas [name] regarding his persistent low back pain via telephone for his virtual conference.  He was unable to do the video portion of this.  He states that the pain is been stable in nature with no significant changes noted.  Otherwise been in his usual state of health.  His back pain quality characteristic and distribution has been stable and he still having pain radiating into the legs periodically.  For the most part has been stable.  He is taking his medications as prescribed and these continue to work well for him.    Observations/Objective:  Current Outpatient Medications:  .  acetaminophen (TYLENOL) 500 MG tablet, Take 2,000 mg by mouth daily as needed for moderate pain or headache., Disp: , Rfl:  .  apixaban (ELIQUIS) 5 MG TABS tablet, Take 1 tablet (5 mg total) by mouth 2 (two) times daily., Disp: 180 tablet, Rfl: 4 .  ascorbic acid (VITAMIN C) 250 MG tablet, Take 250 mg by mouth daily. , Disp: , Rfl:  .  Cranberry 500 MG CAPS, Take 1 capsule by mouth daily. , Disp: , Rfl:  .  Cyanocobalamin (B-12) 1000 MCG CAPS, Take 1,000 mcg by mouth daily. , Disp: , Rfl:  .  diclofenac sodium (VOLTAREN) 1 % GEL, diclofenac 1 % topical gel  APPLY 2 GRAMS TO AFFECTED AREA 4 TIMES A DAY, Disp: , Rfl:  .  ferrous sulfate 325 (65 FE) MG tablet, Take 325 mg by mouth daily with  breakfast., Disp: , Rfl:  .  flecainide (TAMBOCOR) 100 MG tablet, Take 1 tablet (100 mg total) by mouth 2 (two) times daily., Disp: 180 tablet, Rfl: 3 .  gabapentin (NEURONTIN) 400 MG capsule, Take 1 capsule (400 mg total) by mouth daily., Disp: 90 capsule, Rfl: 1 .  [START ON 01/15/2019] HYDROcodone-acetaminophen (NORCO/VICODIN) 5-325 MG tablet, Take 1 tablet by mouth 2 (two) times daily., Disp: 60 tablet, Rfl: 0 .  [START ON 02/14/2019] HYDROcodone-acetaminophen (NORCO/VICODIN) 5-325 MG tablet, Take 1 tablet by mouth 2 (two) times daily., Disp: 60 tablet, Rfl: 0 .  hyoscyamine (LEVSIN, ANASPAZ) 0.125 MG tablet, Take 0.125 mg by mouth every 6 (six) hours as needed for bladder spasms or cramping. , Disp: , Rfl:  .  LORazepam (ATIVAN) 1 MG tablet, Take 1 tablet (1 mg total) by mouth at bedtime., Disp: 90 tablet, Rfl: 1 .  Magnesium 400 MG TABS, Take 400 mg by mouth daily. , Disp: , Rfl:  .  Melatonin 5 MG TABS, Take 10 mg by mouth at bedtime., Disp: , Rfl:  .  Multiple Vitamin (MULTI-VITAMINS) TABS, Take 1 tablet by mouth daily. , Disp: , Rfl:  .  naproxen sodium (ALEVE) 220 MG tablet, Take 220 mg by mouth., Disp: , Rfl:  .  Omega-3 Fatty Acids (FISH OIL) 1200 MG CAPS, Take 1,200 mg by mouth daily., Disp: , Rfl:  .  oxymetazoline (AFRIN) 0.05 %  nasal spray, Place 2 sprays into both nostrils at bedtime as needed for congestion., Disp: , Rfl:  .  pantoprazole (PROTONIX) 40 MG tablet, TAKE 1 TABLET (40 MG TOTAL) BY MOUTH ONCE DAILY TAKE 30 MINS BEFORE MEAL, Disp: , Rfl: 6 .  Probiotic Product (Hester) CAPS, Take by mouth., Disp: , Rfl:  .  QUEtiapine Fumarate (SEROQUEL XR) 150 MG 24 hr tablet, TAKE 1 TABLET (150 MG TOTAL) BY MOUTH AT BEDTIME., Disp: 90 tablet, Rfl: 3 .  simvastatin (ZOCOR) 20 MG tablet, Take 1 tablet (20 mg total) by mouth daily., Disp: 90 tablet, Rfl: 4 .  solifenacin (VESICARE) 10 MG tablet, Take 1 tablet (10 mg total) by mouth daily., Disp: 90 tablet, Rfl: 4 .   venlafaxine XR (EFFEXOR XR) 75 MG 24 hr capsule, Take 3 capsules (225 mg total) by mouth daily., Disp: 270 capsule, Rfl: 4 Assessment and Plan:  1. Lumbar radiculopathy   2. Degeneration of lumbar intervertebral disc   3. Chronic, continuous use of opioids   4. Chronic pain syndrome   5. Bilateral hand pain   6. Facet syndrome, lumbar   7. Osteoarthritis of right hip, unspecified osteoarthritis type   Based on our discussion today upon review of the [employer] practitioner database information going to refill his medications for December 23 and January 22.  Dosing will be the same and he is to continue working on his stretching strengthening exercises as tolerated.  He can continue his golf as well.  Efforts at weight loss will likely be of benefit.  I want him to continue follow-up with his primary care physicians for his baseline medical care with return to clinic in 2 months. Follow Up Instructions:    I discussed the assessment and treatment plan with the patient. The patient was provided an opportunity to ask questions and all were answered. The patient agreed with the plan and demonstrated an understanding of the instructions.   The patient was advised to call back or seek an in-person evaluation if the symptoms worsen or if the condition fails to improve as anticipated.  I provided 30 minutes of non-face-to-face time during this encounter.   [name], MD

## 2019-01-09 NOTE — Progress Notes (Signed)
Remote pacemaker transmission.   

## 2019-01-13 ENCOUNTER — Other Ambulatory Visit: Payer: Self-pay | Admitting: Nurse Practitioner

## 2019-01-13 ENCOUNTER — Telehealth: Payer: Self-pay | Admitting: Family Medicine

## 2019-01-13 ENCOUNTER — Other Ambulatory Visit: Payer: Self-pay

## 2019-01-13 MED ORDER — LORAZEPAM 1 MG PO TABS: 1.0000 mg | ORAL_TABLET | Freq: Every day | ORAL | 0 refills | Status: DC

## 2019-01-13 NOTE — Telephone Encounter (Signed)
Patient last seen 10/18/18

## 2019-01-13 NOTE — Telephone Encounter (Signed)
Patient called and would like to talk to someone about his CPE visit on 10/18/18 and why his insurance did not pay for it and why he got a bill for it. Please call patient back, thanks.

## 2019-01-13 NOTE — Telephone Encounter (Signed)
Sent in a 30 day supply only.  Dr. Jeananne Rama patient, he will need a follow-up scheduled with a provider prior to more refills on this.

## 2019-01-23 NOTE — Telephone Encounter (Signed)
LMOM to advise patient that a request was sent to billing department to reprocess claim for DOS 10/18/18. Also provided my direct number for patient to return my call with any questions.

## 2019-01-27 ENCOUNTER — Telehealth: Payer: Self-pay | Admitting: Family Medicine

## 2019-01-27 NOTE — Telephone Encounter (Signed)
Pt calling in stating that visit DOS Oct 24, 2018 was coded as a CPE, pt says that he was told by medicare that CPE's are not covered. Pt says that provider told him that visit wouldn't be coded as CPE, pt would like to know if visit could be recoded for coverage?   Please assist.

## 2019-01-28 NOTE — Telephone Encounter (Signed)
Called patient and advised that a CPE diagnosis code was submitted to the insurance company in error.  Also, sent a request to the billing department to have Z00.00 diagnosis code removed and claim resubmitted to Medicare for payment.

## 2019-01-29 ENCOUNTER — Ambulatory Visit: Payer: Self-pay | Admitting: Pharmacist

## 2019-01-29 ENCOUNTER — Telehealth: Payer: Self-pay

## 2019-01-29 NOTE — Chronic Care Management (AMB) (Signed)
  Chronic Care Management   Note  01/29/2019 Name: Nicholas [name] MRN: [id number] DOB: 1945/11/18  [name] is a 74 y.o. year old male who is a primary care patient of Crissman, Jeannette How, MD. The CCM team was consulted for assistance with chronic disease management and care coordination needs.    Attempted to contact patient for medication management review as scheduled. Left HIPAA compliant message for patient to return my call at his convenience.   Follow up plan: - Will collaborate w/ Care Guide to attempt to schedule a follow up phone call with me  Catie Darnelle Maffucci, PharmD, [place] [phone]

## 2019-01-30 ENCOUNTER — Telehealth: Payer: Self-pay | Admitting: Family Medicine

## 2019-01-30 NOTE — Chronic Care Management (AMB) (Signed)
  Care Management   Note  01/30/2019 Name: [name] MRN: [id number] DOB: 1945-07-19  Nicholas [name] is a 74 y.o. year old male who is a primary care patient of Crissman, Jeannette How, MD and is actively engaged with the care management team. I reached out to Nicholas [name] by phone today to assist with re-scheduling a follow up appointment with the Pharmacist  Follow up plan: Telephone appointment with care management team member scheduled for: 02/26/2019  [place], Malcolm Management  [place], [place] [address] Direct Dial: [email or web address]  Website: [email or web address]

## 2019-02-10 ENCOUNTER — Ambulatory Visit (INDEPENDENT_AMBULATORY_CARE_PROVIDER_SITE_OTHER): Payer: Medicare Other | Admitting: Nurse Practitioner

## 2019-02-10 ENCOUNTER — Other Ambulatory Visit: Payer: Self-pay | Admitting: Nurse Practitioner

## 2019-02-10 ENCOUNTER — Other Ambulatory Visit: Payer: Self-pay

## 2019-02-10 ENCOUNTER — Encounter: Payer: Self-pay | Admitting: Nurse Practitioner

## 2019-02-10 VITALS — BP 129/78 | HR 88 | Temp 98.2°F

## 2019-02-10 DIAGNOSIS — R21 Rash and other nonspecific skin eruption: Secondary | ICD-10-CM

## 2019-02-10 MED ORDER — MICONAZOLE NITRATE 2 % EX CREA: 1.0000 "application " | TOPICAL_CREAM | Freq: Two times a day (BID) | CUTANEOUS | 0 refills | Status: DC

## 2019-02-10 NOTE — Patient Instructions (Signed)
Athlete's Foot  Athlete's foot (tinea pedis) is a fungal infection of the skin on your feet. It often occurs on the skin that is between or underneath your toes. It can also occur on the soles of your feet. Symptoms include itchy or white and flaky areas on the skin. The infection can spread from person to person (is contagious). It can also spread when a person's bare feet come in contact with the fungus on shower floors or on items such as shoes. Follow these instructions at home: Medicines  Apply or take over-the-counter and prescription medicines only as told by your doctor.  Apply your antifungal medicine as told by your doctor. Do not stop using the medicine even if your feet start to get better. Foot care  Do not scratch your feet.  Keep your feet dry: ? Wear cotton or wool socks. Change your socks every day or if they become wet. ? Wear shoes that allow air to move around, such as sandals or canvas tennis shoes.  Wash and dry your feet: ? Every day or as told by your doctor. ? After exercising. ? Including the area between your toes. General instructions  Do not share any of these items that touch your feet: ? Towels. ? Shoes. ? Nail clippers. ? Other personal items.  Protect your feet by wearing sandals in wet areas, such as locker rooms and shared showers.  Keep all follow-up visits as told by your doctor. This is important.  If you have diabetes, keep your blood sugar under control. Contact a doctor if:  You have a fever.  You have swelling, pain, warmth, or redness in your foot.  Your feet are not getting better with treatment.  Your symptoms get worse.  You have new symptoms. Summary  Athlete's foot is a fungal infection of the skin on your feet.  Symptoms include itchy or white and flaky areas on the skin.  Apply your antifungal medicine as told by your doctor.  Keep your feet clean and dry. This information is not intended to replace advice given  to you by your health care provider. Make sure you discuss any questions you have with your health care provider. Document Revised: 01/04/2017 Document Reviewed: 10/30/2016 Elsevier Patient Education  2020 Elsevier Inc.  

## 2019-02-10 NOTE — Progress Notes (Signed)
BP 129/78 (BP Location: Left Arm, Patient Position: Sitting, Cuff Size: Normal)   Pulse 88   Temp 98.2 F (36.8 C) (Oral)   SpO2 96%    Subjective:    Patient ID: Nicholas [name], male    DOB: 01-15-1946, 74 y.o.   MRN: [id number]  HPI: Nicholas [name] is a 74 y.o. male  Chief Complaint  Patient presents with  . Rash    arms and feet, itchy.    RASH Has had rash to feet for one month, R>L.  Then about 2 weeks ago began to present to arms.  Has been playing golf, but did not go into [place].  Duration:  weeks  Location: arms and legs  Itching: yes Burning: no Redness: yes Oozing: no Scaling: yes Blisters: no Painful: no Fevers: no Change in detergents/soaps/personal care products: no Recent illness: no Recent travel:no History of same: no Context: stable Alleviating factors: hydrocortisone cream Treatments attempted:hydrocortisone cream Shortness of breath: no  Throat/tongue swelling: no Myalgias/arthralgias: no  Relevant past medical, surgical, family and social history reviewed and updated as indicated. Interim medical history since our last visit reviewed. Allergies and medications reviewed and updated.  Review of Systems  Constitutional: Negative.   Respiratory: Negative.   Cardiovascular: Negative.   Musculoskeletal: Negative.   Skin: Positive for rash.  Neurological: Negative.     Per HPI unless specifically indicated above     Objective:    BP 129/78 (BP Location: Left Arm, Patient Position: Sitting, Cuff Size: Normal)   Pulse 88   Temp 98.2 F (36.8 C) (Oral)   SpO2 96%   Wt Readings from Last 3 Encounters:  09/24/18 282 lb (127.9 kg)  04/18/18 280 lb (127 kg)  03/21/18 280 lb 9 oz (127.3 kg)    Physical Exam Vitals and nursing note reviewed.  Constitutional:      General: He is awake. He is not in acute distress.    Appearance: He is well-developed. He is obese. He is not ill-appearing.  HENT:     Head: Normocephalic and atraumatic.    Right Ear: Hearing normal. No drainage.     Left Ear: Hearing normal. No drainage.  Eyes:     General: Lids are normal.        Right eye: No discharge.        Left eye: No discharge.     Conjunctiva/sclera: Conjunctivae normal.     Pupils: Pupils are equal, round, and reactive to light.  Cardiovascular:     Rate and Rhythm: Normal rate and regular rhythm.     Heart sounds: Normal heart sounds, S1 normal and S2 normal. No murmur. No gallop.   Pulmonary:     Effort: Pulmonary effort is normal. No accessory muscle usage or respiratory distress.     Breath sounds: Normal breath sounds.  Abdominal:     General: Bowel sounds are normal.     Palpations: Abdomen is soft.  Musculoskeletal:        General: Normal range of motion.     Cervical back: Normal range of motion and neck supple.     Right lower leg: No edema.     Left lower leg: No edema.  Skin:    General: Skin is warm and dry.     Capillary Refill: Capillary refill takes less than 2 seconds.     Findings: Rash present. Rash is scaling.     Comments: Small, approx 3 cm round patch to dorsal aspect  right foot.  Area with mild erythema and scaling around exterior.  No blisters or warmth.  Bilateral feet with xerosis.  Similar small patch to inner right forearm.  Both appear to be healing.  No other areas noted.    Neurological:     Mental Status: He is alert and oriented to person, place, and time.  Psychiatric:        Mood and Affect: Mood normal.        Behavior: Behavior normal. Behavior is cooperative.        Thought Content: Thought content normal.        Judgment: Judgment normal.    Results for orders placed or performed in visit on 12/11/18  CUP PACEART REMOTE DEVICE CHECK  Result Value Ref Range   Date Time Interrogation Session [id number]    Pulse Generator Manufacturer MERM    Pulse Gen Model [id number] Advisa DR MRI    Pulse Gen Serial Number [id number] [place] [facility]    Implantable Pulse  Generator Type Implantable Pulse Generator    Implantable Pulse Generator Implant Date ZQ:6808901    Implantable Lead Manufacturer MERM    Implantable Lead Model 3830 SelectSecure MRI SureScan    Implantable Lead Serial Number [id number]    Implantable Lead Implant Date ZQ:6808901    Implantable Lead Location Detail 1 UNKNOWN    Implantable Lead Special Function Bundle of His    Implantable Lead Location [id number]    Implantable Lead Manufacturer MERM    Implantable Lead Model 5076 CapSureFix Novus MRI SureScan    Implantable Lead Serial Number [id number]    Implantable Lead Implant Date ZQ:6808901    Implantable Lead Location Detail 1 APPENDAGE    Implantable Lead Location [id number]    Lead Channel Setting Sensing Sensitivity 0.9 mV   Lead Channel Setting Pacing Amplitude 2 V   Lead Channel Setting Pacing Pulse Width 1 ms   Lead Channel Setting Pacing Amplitude 2.25 V   Lead Channel Impedance Value 399 ohm   Lead Channel Impedance Value 361 ohm   Lead Channel Sensing Intrinsic Amplitude 4 mV   Lead Channel Sensing Intrinsic Amplitude 4 mV   Lead Channel Pacing Threshold Amplitude 0.5 V   Lead Channel Pacing Threshold Pulse Width 0.4 ms   Lead Channel Impedance Value 494 ohm   Lead Channel Impedance Value 361 ohm   Lead Channel Sensing Intrinsic Amplitude 1.625 mV   Lead Channel Sensing Intrinsic Amplitude 1.625 mV   Lead Channel Pacing Threshold Amplitude 0.75 V   Lead Channel Pacing Threshold Pulse Width 0.4 ms   Battery Status OK    Battery Remaining Longevity 65 mo   Battery Voltage 3.00 V   Brady Statistic RA Percent Paced 22.6 %   Brady Statistic RV Percent Paced 99.98 %   Brady Statistic AP VP Percent 22.62 %   Brady Statistic AS VP Percent 77.36 %   Brady Statistic AP VS Percent 0 %   Brady Statistic AS VS Percent 0.02 %      Assessment & Plan:   Problem List Items Addressed This Visit    None    Visit Diagnoses    Rash    -  Primary   Suspect tinea infection.  Script for  antifungal cream sent.  Keep area clean and dry.  To monitor area and if worsening or no improvement return to office.         Follow up plan: Return if symptoms worsen or  fail to improve.

## 2019-02-11 NOTE — Telephone Encounter (Signed)
Requested medication (s) are due for refill today: yes  Requested medication (s) are on the active medication list: yes  Last refill:  01/13/2019  Future visit scheduled: no  Notes to clinic:  not delegated   Requested Prescriptions  Pending Prescriptions Disp Refills   LORazepam (ATIVAN) 1 MG tablet [Pharmacy Med Name: LORAZEPAM 1 MG TABLET] 30 tablet 0    Sig: Take 1 tablet (1 mg total) by mouth at bedtime.      Not Delegated - Psychiatry:  Anxiolytics/Hypnotics Failed - 02/10/2019  7:44 PM      Failed - This refill cannot be delegated      Failed - Urine Drug Screen completed in last 360 days.      Passed - Valid encounter within last 6 months    Recent Outpatient Visits           [place], [place] T, NP   3 months ago Annual physical exam   [place], [name], NP   7 months ago Depression, recurrent [facility])   [place], MD   10 months ago Lower respiratory infection   [place], Lilia Argue, [place]   1 year ago Essential hypertension   [facility] Criss[facility], Jeannette How, MD

## 2019-02-17 ENCOUNTER — Other Ambulatory Visit: Payer: Self-pay | Admitting: Nurse Practitioner

## 2019-02-17 NOTE — Telephone Encounter (Signed)
Routing to provider  

## 2019-02-17 NOTE — Telephone Encounter (Signed)
Requested medication (s) are due for refill today: no  Requested medication (s) are on the active medication list: yes  Last refill:  02/10/2019  Future visit scheduled: yes  Notes to clinic:   Medication not assigned to a protocol, review manually  Requested Prescriptions  Pending Prescriptions Disp Refills   ANTIFUNGAL 2 % cream [Pharmacy Med Name: ANTIFUNGAL 2% TOPICAL CREAM] 28 g 0    Sig: APPLY TO AFFECTED AREA TWICE A DAY      Off-Protocol Failed - 02/17/2019  9:05 AM      Failed - Medication not assigned to a protocol, review manually.      Passed - Valid encounter within last 12 months    Recent Outpatient Visits           1 week ago [place], Barbaraann Faster, NP   4 months ago Annual physical exam   [place], [name], NP   7 months ago Depression, recurrent [facility])   [place], Jeannette How, MD   11 months ago Lower respiratory infection   [place], Lilia Argue, [place]   1 year ago Essential hypertension   [facility] Crissman, Jeannette How, MD

## 2019-02-20 ENCOUNTER — Ambulatory Visit: Payer: Medicare Other

## 2019-02-20 ENCOUNTER — Ambulatory Visit: Payer: Medicare Other | Admitting: Family Medicine

## 2019-02-26 ENCOUNTER — Ambulatory Visit (INDEPENDENT_AMBULATORY_CARE_PROVIDER_SITE_OTHER): Payer: Medicare Other

## 2019-02-26 DIAGNOSIS — F339 Major depressive disorder, recurrent, unspecified: Secondary | ICD-10-CM

## 2019-02-26 DIAGNOSIS — F419 Anxiety disorder, unspecified: Secondary | ICD-10-CM

## 2019-02-26 DIAGNOSIS — G894 Chronic pain syndrome: Secondary | ICD-10-CM

## 2019-02-26 NOTE — Patient Instructions (Signed)
Visit Information  Goals Addressed            This Visit's Progress     Patient Stated   . PharmD "I want to stay healthy" (pt-stated)       Current Barriers:  . Polypharmacy; complex patient with multiple comorbidities including chronic pain, anxiety, Afib, hx PE, heart block . 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 Notes he has tried to apply for VA benefits "a few times", with no success.  Marland Kitchen His wife helps w/ management of regimen o Afib/ hx PE: flecanide 100 mg BID, Eliquis 5 mg BID o Chronic pain: hydrocodone/APAP 5/325 mg BID, gabapentin 400 mg QPM, diclofenac 1% cream. Follows w/ pain management Dr. Andree Elk.  o Anxiety/depression/sleep: venlafaxine 225 mg daily, quetiapine 150 mg QPM, lorazepam 1 mg QPM, melatonin 10 mg QPM. Notes that he is unable to sleep without lorazepam. No hx trazodone.  - Denies caffeine in the afternoons, is not using screens/watching TV in bed. Does note napping more during the day lately, due to being "stuck at home" and not being out playing golf as much o OAB: solifenacin 10 mg daily, hyoscyamine 0.125 mg PRN bladder spasms o ASCVD risk reduction: simvastatin 20 mg QPM, omega 3 fatty acids 1200 mg daily o Suspected tinea pedis. No improvement w/ miconazole, patient has seen dermatology and is using OTC terbinafine topical. Notes that he is supposed to call dermatology if no improvement in a week to discuss oral terbinafine therapy o Nasal congestion: Afrin QPM at bedtime. He notes no other nasal sprays have helped relieve his bedtime congestion. No daytime symptoms o GERD: pantoprazole 40 mg daily  o Supplements: Vitamin C, cranberry, vitamin B12, MVI, probiotic  Pharmacist Clinical Goal(s):  Marland Kitchen Over the next 90 days, patient will work with PharmD and provider towards optimized medication management  Interventions: . Comprehensive medication review performed; medication list updated in electronic medical record . Reviewed risks  of combination benzodiazepine/opioid therapies. Discussed goal to minimize lorazepam need. Recommended he try 1/2 lorazepam tablet (0.5 mg) for sleep, instead of a full tablet. . Discussed good sleep hygiene habits. SNRI may be more activating than other antidepressant options. Could consider trying SSRI; appears to have hx fluoxetine, but no others. No hx trazodone. He would like an appointment w/ PCP to discuss options. Will collaborate w/ administrative staff to outreach to schedule on a Wednesday when I am here.  . Discussed rebound congestion w/ Afrin, however, patient has been on this medication for years.  . Discussed oral terbinafine. He notes that dermatology noted the medication could impact his liver. Encouraged him to call dermatology to follow up.  . Will collaborate w/ LCSW to outreach patient for support in pursuing veterans services  Patient Self Care Activities:  . Patient will take medications as prescribed  Initial goal documentation        The patient verbalized understanding of instructions provided today and declined a print copy of patient instruction materials.    Plan: - Will collaborate w/ scheduling team and LCSW as above  Catie Darnelle Maffucci, PharmD, [place] [phone]

## 2019-02-26 NOTE — Chronic Care Management (AMB) (Signed)
Chronic Care Management   Note  02/26/2019 Name: Nicholas [name] MRN: [id number] DOB: [birth year]   Subjective:  Nicholas [name] is a 74 y.o. year old male who is a primary care patient of Crissman, Jeannette How, MD. The CCM team was consulted for assistance with chronic disease management and care coordination needs.    Contacted patient as scheduled for medication management review.   Review of patient status, including review of consultants reports, laboratory and other test data, was performed as part of comprehensive evaluation and provision of chronic care management services.   Objective:  Lab Results  Component Value Date   CREATININE 1.11 07/16/2018   CREATININE 1.34 (H) 01/05/2018   CREATININE 1.11 01/03/2018    Lab Results  Component Value Date   HGBA1C 5.7 10/20/2014       Component Value Date/Time   CHOL 173 07/16/2018 0813   CHOL 198 01/03/2018 1610   TRIG 131 07/16/2018 0813   TRIG 264 (H) 01/03/2018 1610   HDL 48 07/16/2018 0813   CHOLHDL 3.6 07/16/2018 0813   VLDL 53 (H) 01/03/2018 1610   LDLCALC 99 07/16/2018 0813    Clinical ASCVD: No  The 10-year ASCVD risk score Nicholas Bussing DC Jr., et al., 2013) is: 21.3%   Values used to calculate the score:     Age: 43 years     Sex: Male     Is Non-Hispanic African American: No     Diabetic: No     Tobacco smoker: No     Systolic Blood Pressure: [id number] mmHg     Is BP treated: No     HDL Cholesterol: 48 mg/dL     Total Cholesterol: 173 mg/dL    BP Readings from Last 3 Encounters:  02/10/19 129/78  10/18/18 (!) 141/96  09/24/18 138/82    No Known Allergies  Medications Reviewed Today    Reviewed by De Hollingshead, [facility] (Pharmacist) on 02/26/19 at 44  Med List Status: <None>  Medication Order Taking? Sig Documenting Provider Last Dose Status Informant  acetaminophen (TYLENOL) 500 MG tablet [id number] No Take 2,000 mg by mouth daily as needed for moderate pain or headache. [provider] Not Taking Active  Spouse/Significant Other  apixaban (ELIQUIS) 5 MG TABS tablet [id number] Yes Take 1 tablet (5 mg total) by mouth 2 (two) times daily. [name], MD Taking Active   ascorbic acid (VITAMIN C) 250 MG tablet [id number] Yes Take 250 mg by mouth daily.  [provider] Taking Active Spouse/Significant Other  Cranberry 500 MG CAPS [id number] Yes Take 1 capsule by mouth daily.  [provider] Taking Active Spouse/Significant Other  Cyanocobalamin (B-12) 1000 MCG CAPS [id number] Yes Take 1,000 mcg by mouth daily.  [provider] Taking Active Spouse/Significant Other  diclofenac sodium (VOLTAREN) 1 % GEL [id number]  diclofenac 1 % topical gel  APPLY 2 GRAMS TO AFFECTED AREA 4 TIMES A DAY [provider]  Active   ferrous sulfate 325 (65 FE) MG tablet [id number] Yes Take 325 mg by mouth daily with breakfast. [provider] Taking Active Spouse/Significant Other  flecainide (TAMBOCOR) 100 MG tablet [id number] Yes Take 1 tablet (100 mg total) by mouth 2 (two) times daily. Nicholas Sprang, MD Taking Active   gabapentin (NEURONTIN) 400 MG capsule [id number] Yes Take 1 capsule (400 mg total) by mouth daily. [name], MD Taking Active   HYDROcodone-acetaminophen (NORCO/VICODIN) 5-325 MG tablet [id number] Yes Take 1 tablet by mouth 2 (two) times daily.  [provider] Taking Active   hyoscyamine (LEVSIN, ANASPAZ) 0.125 MG tablet [id number] No Take 0.125 mg by mouth every 6 (six) hours as needed for bladder spasms or cramping.  [provider] Not Taking Active Spouse/Significant Other           Med Note Caryn Section, [place] A   Fri Feb 11, 2016 11:33 AM)    LORazepam (ATIVAN) 1 MG tablet [id number] Yes Take 1 tablet (1 mg total) by mouth at bedtime. For further refills needs to see provider. Nicholas [name] T, NP Taking Active   Magnesium 400 MG TABS [id number] Yes Take 400 mg by mouth daily.  [provider] Taking Active Spouse/Significant Other    Melatonin 5 MG TABS [id number]  Take 10 mg by mouth at bedtime. [provider]  Active Spouse/Significant Other  miconazole (MICONAZOLE ANTIFUNGAL) 2 % cream [id number]  Apply 1 application topically 2 (two) times daily. Nicholas [name] T, NP  Active   Multiple Vitamin (MULTI-VITAMINS) TABS [id number] Yes Take 1 tablet by mouth daily.  [provider] Taking Active Spouse/Significant Other  naproxen sodium (ALEVE) 220 MG tablet [id number]  Take 220 mg by mouth. [provider]  Active   Omega-3 Fatty Acids (FISH OIL) 1200 MG CAPS [id number] Yes Take 1,200 mg by mouth daily. [provider] Taking Active Spouse/Significant Other  oxymetazoline (AFRIN) 0.05 % nasal spray [id number] Yes Place 2 sprays into both nostrils at bedtime as needed for congestion. [provider] Taking Active Spouse/Significant Other  pantoprazole (PROTONIX) 40 MG tablet [id number] Yes TAKE 1 TABLET (40 MG TOTAL) BY MOUTH ONCE DAILY TAKE 30 MINS BEFORE MEAL [provider] Taking Active Spouse/Significant Other  Probiotic Product ([place]) CAPS [id number] Yes Take by mouth. [provider] Taking Active   QUEtiapine Fumarate (SEROQUEL XR) 150 MG 24 hr tablet [id number] Yes TAKE 1 TABLET (150 MG TOTAL) BY MOUTH AT BEDTIME. Nicholas [name] T, NP Taking Active   simvastatin (ZOCOR) 20 MG tablet [id number] Yes Take 1 tablet (20 mg total) by mouth daily. [name], MD Taking Active   solifenacin (VESICARE) 10 MG tablet [id number] Yes Take 1 tablet (10 mg total) by mouth daily. [name], MD Taking Active   venlafaxine XR (EFFEXOR XR) 75 MG 24 hr capsule [id number] Yes Take 3 capsules (225 mg total) by mouth daily. [name], MD Taking Active   Med List Note Janett Billow, RN 05/09/18 1057): UDS 06-22-17 MR 07/05/18 When searching in control substance database search exactly this: Nicholas [name] jr. 03-06-18 Hydrocodone approved  02/03/18-03/05/19.            Assessment:   Goals Addressed            This Visit's Progress     Patient Stated   . PharmD "I want to stay healthy" (pt-stated)       Current Barriers:  . Polypharmacy; complex patient with multiple comorbidities including chronic pain, anxiety, Afib, hx PE, heart block . Reports hx tinnitus since serving in the [employer] in the 1960s. Notes he has tried to apply for VA benefits "a few times", with no success.  Marland Kitchen His wife helps w/ management of regimen o Afib/ hx PE: flecanide 100 mg BID, Eliquis 5 mg BID o Chronic pain: hydrocodone/APAP 5/325 mg BID, gabapentin 400 mg QPM, diclofenac 1% cream. Follows w/ pain management Dr. Andree Elk.  o Anxiety/depression/sleep: venlafaxine 225 mg daily, quetiapine 150 mg QPM, lorazepam 1 mg QPM, melatonin 10 mg QPM.  Notes that he is unable to sleep without lorazepam. No hx trazodone.  - Denies caffeine in the afternoons, is not using screens/watching TV in bed. Does note napping more during the day lately, due to being "stuck at home" and not being out playing golf as much o OAB: solifenacin 10 mg daily, hyoscyamine 0.125 mg PRN bladder spasms o ASCVD risk reduction: simvastatin 20 mg QPM, omega 3 fatty acids 1200 mg daily o Suspected tinea pedis. No improvement w/ miconazole, patient has seen dermatology and is using OTC terbinafine topical. Notes that he is supposed to call dermatology if no improvement in a week to discuss oral terbinafine therapy o Nasal congestion: Afrin QPM at bedtime. He notes no other nasal sprays have helped relieve his bedtime congestion. No daytime symptoms o GERD: pantoprazole 40 mg daily  o Supplements: Vitamin C, cranberry, vitamin B12, MVI, probiotic  Pharmacist Clinical Goal(s):  Marland Kitchen Over the next 90 days, patient will work with PharmD and provider towards optimized medication management  Interventions: . Comprehensive medication review performed; medication list updated in electronic  medical record . Reviewed risks of combination benzodiazepine/opioid therapies. Discussed goal to minimize lorazepam need. Recommended he try 1/2 lorazepam tablet (0.5 mg) for sleep, instead of a full tablet. . Discussed good sleep hygiene habits. SNRI may be more activating than other antidepressant options. Could consider trying SSRI; appears to have hx fluoxetine, but no others. No hx trazodone. He would like an appointment w/ PCP to discuss options. Will collaborate w/ administrative staff to outreach to schedule on a Wednesday when I am here.  . Discussed rebound congestion w/ Afrin, however, patient has been on this medication for years.  . Discussed oral terbinafine. He notes that dermatology noted the medication could impact his liver. Encouraged him to call dermatology to follow up.  . Will collaborate w/ LCSW to outreach patient for support in pursuing veterans services  Patient Self Care Activities:  . Patient will take medications as prescribed  Initial goal documentation        Plan: - Will collaborate w/ scheduling team and LCSW as above  Catie Darnelle Maffucci, PharmD, [place] [phone]

## 2019-03-01 ENCOUNTER — Ambulatory Visit: Payer: Medicare Other

## 2019-03-04 ENCOUNTER — Other Ambulatory Visit: Payer: Self-pay | Admitting: Nurse Practitioner

## 2019-03-04 DIAGNOSIS — F339 Major depressive disorder, recurrent, unspecified: Secondary | ICD-10-CM

## 2019-03-04 DIAGNOSIS — F419 Anxiety disorder, unspecified: Secondary | ICD-10-CM

## 2019-03-04 NOTE — Telephone Encounter (Signed)
Patient last seen 10/18/18 and has appointment 03/26/19

## 2019-03-07 ENCOUNTER — Ambulatory Visit: Payer: Medicare Other | Attending: Internal Medicine

## 2019-03-07 ENCOUNTER — Other Ambulatory Visit: Payer: Self-pay

## 2019-03-07 DIAGNOSIS — Z23 Encounter for immunization: Secondary | ICD-10-CM

## 2019-03-07 NOTE — Progress Notes (Signed)
   Covid-19 Vaccination Clinic  Name:  Nicholas [name]    MRN: [id number] DOB: [birth year]  03/07/2019  [name] was observed post Covid-19 immunization for 30 minutes based on pre-vaccination screening without incidence. He was provided with Vaccine Information Sheet and instruction to access the V-Safe system.   [name] was instructed to call 911 with any severe reactions post vaccine: Marland Kitchen Difficulty breathing  . Swelling of your face and throat  . A fast heartbeat  . A bad rash all over your body  . Dizziness and weakness

## 2019-03-12 ENCOUNTER — Ambulatory Visit (INDEPENDENT_AMBULATORY_CARE_PROVIDER_SITE_OTHER): Payer: Medicare Other | Admitting: *Deleted

## 2019-03-12 DIAGNOSIS — I442 Atrioventricular block, complete: Secondary | ICD-10-CM | POA: Diagnosis not present

## 2019-03-12 LAB — CUP PACEART REMOTE DEVICE CHECK
Battery Remaining Longevity: 58 mo
Battery Voltage: 3 V
Brady Statistic AP VP Percent: 26.81 %
Brady Statistic AP VS Percent: 0 %
Brady Statistic AS VP Percent: 73.16 %
Brady Statistic AS VS Percent: 0.02 %
Brady Statistic RA Percent Paced: 26.77 %
Brady Statistic RV Percent Paced: 99.96 %
Date Time Interrogation Session: 20210217131455
Implantable Lead Implant Date: 20180124
Implantable Lead Implant Date: 20180124
Implantable Lead Location: 753859
Implantable Lead Location: 753860
Implantable Lead Model: 3830
Implantable Lead Model: 5076
Implantable Pulse Generator Implant Date: 20180124
Lead Channel Impedance Value: 342 Ohm
Lead Channel Impedance Value: 380 Ohm
Lead Channel Impedance Value: 399 Ohm
Lead Channel Impedance Value: 475 Ohm
Lead Channel Pacing Threshold Amplitude: 0.5 V
Lead Channel Pacing Threshold Amplitude: 0.75 V
Lead Channel Pacing Threshold Pulse Width: 0.4 ms
Lead Channel Pacing Threshold Pulse Width: 0.4 ms
Lead Channel Sensing Intrinsic Amplitude: 1.75 mV
Lead Channel Sensing Intrinsic Amplitude: 1.75 mV
Lead Channel Sensing Intrinsic Amplitude: 3.75 mV
Lead Channel Sensing Intrinsic Amplitude: 3.75 mV
Lead Channel Setting Pacing Amplitude: 2 V
Lead Channel Setting Pacing Amplitude: 2.25 V
Lead Channel Setting Pacing Pulse Width: 1 ms
Lead Channel Setting Sensing Sensitivity: 0.9 mV

## 2019-03-12 NOTE — Progress Notes (Signed)
PPM Remote  

## 2019-03-13 ENCOUNTER — Ambulatory Visit: Payer: Medicare Other

## 2019-03-17 ENCOUNTER — Other Ambulatory Visit: Payer: Self-pay | Admitting: Nurse Practitioner

## 2019-03-17 NOTE — Telephone Encounter (Signed)
Requested medication (s) are due for refill today: yes  Requested medication (s) are on the active medication list: yes  Last refill:  02/11/19  Future visit scheduled: no  Notes to clinic:  not delegated   Requested Prescriptions  Pending Prescriptions Disp Refills   LORazepam (ATIVAN) 1 MG tablet [Pharmacy Med Name: LORAZEPAM 1 MG TABLET] 30 tablet 0    Sig: Take 1 tablet (1 mg total) by mouth at bedtime. For further refills needs to see provider.      Not Delegated - Psychiatry:  Anxiolytics/Hypnotics Failed - 03/17/2019 11:51 AM      Failed - This refill cannot be delegated      Failed - Urine Drug Screen completed in last 360 days.      Passed - Valid encounter within last 6 months    Recent Outpatient Visits           1 month ago [place], Barbaraann Faster, NP   5 months ago Annual physical exam   [place], [name], NP   8 months ago Depression, recurrent [facility])   [place], Jeannette How, MD   12 months ago Lower respiratory infection   [place], Lilia Argue, [place]   1 year ago Essential hypertension   [place], Jeannette How, MD       Future Appointments             In 2 days [place], Alvina Filbert, MD [place]   In 1 week [place], Barbaraann Faster, NP [employer]

## 2019-03-17 NOTE — Telephone Encounter (Signed)
Routing to provider  

## 2019-03-19 ENCOUNTER — Ambulatory Visit: Payer: Medicare Other | Attending: Anesthesiology | Admitting: Anesthesiology

## 2019-03-19 ENCOUNTER — Other Ambulatory Visit: Payer: Self-pay

## 2019-03-19 DIAGNOSIS — M47816 Spondylosis without myelopathy or radiculopathy, lumbar region: Secondary | ICD-10-CM

## 2019-03-19 DIAGNOSIS — M5136 Other intervertebral disc degeneration, lumbar region: Secondary | ICD-10-CM | POA: Diagnosis not present

## 2019-03-19 DIAGNOSIS — M79641 Pain in right hand: Secondary | ICD-10-CM

## 2019-03-19 DIAGNOSIS — M5416 Radiculopathy, lumbar region: Secondary | ICD-10-CM

## 2019-03-19 DIAGNOSIS — M79642 Pain in left hand: Secondary | ICD-10-CM

## 2019-03-19 DIAGNOSIS — M5432 Sciatica, left side: Secondary | ICD-10-CM

## 2019-03-19 DIAGNOSIS — M1611 Unilateral primary osteoarthritis, right hip: Secondary | ICD-10-CM

## 2019-03-19 DIAGNOSIS — G894 Chronic pain syndrome: Secondary | ICD-10-CM | POA: Diagnosis not present

## 2019-03-19 DIAGNOSIS — M47817 Spondylosis without myelopathy or radiculopathy, lumbosacral region: Secondary | ICD-10-CM

## 2019-03-19 DIAGNOSIS — M5431 Sciatica, right side: Secondary | ICD-10-CM

## 2019-03-19 DIAGNOSIS — F1190 Opioid use, unspecified, uncomplicated: Secondary | ICD-10-CM

## 2019-03-19 DIAGNOSIS — M4726 Other spondylosis with radiculopathy, lumbar region: Secondary | ICD-10-CM

## 2019-03-19 MED ORDER — HYDROCODONE-ACETAMINOPHEN 5-325 MG PO TABS: 1.0000 | ORAL_TABLET | Freq: Two times a day (BID) | ORAL | 0 refills | Status: AC

## 2019-03-19 MED ORDER — HYDROCODONE-ACETAMINOPHEN 5-325 MG PO TABS: 1.0000 | ORAL_TABLET | Freq: Two times a day (BID) | ORAL | 0 refills | Status: DC

## 2019-03-19 NOTE — Patient Instructions (Addendum)
Virtual Visit via Telephone Note  I connected with Nicholas [name] on @TODAY @ at  1:45 PM EST by telephone and verified that I am speaking with the correct person using two identifiers.  Location: Patient: Home Provider: Pain control center   I discussed the limitations, risks, security and privacy concerns of performing an evaluation and management service by telephone and the availability of in person appointments. I also discussed with the patient that there may be a patient responsible charge related to this service. The patient expressed understanding and agreed to proceed.   History of Present Illness: I was able to reach Nicholas [name] via telephone today.  He is unable to do the video portion of the virtual conference.  He reports that he is doing well with his low back pain and staying active.  The quality characteristic and distribution of been stable in nature.  He continues to play golf and takes his opioids twice a day which is consistent with his long-term therapy.  He gets good relief from these and is sleeping well.  He is able to stay active and based on our discussion continues to derive good functional improvement with the medications.  No change in the quality of his pain are noted and his strength is been good.    Observations/Objective:  Current Outpatient Medications:  .  acetaminophen (TYLENOL) 500 MG tablet, Take 2,000 mg by mouth daily as needed for moderate pain or headache., Disp: , Rfl:  .  apixaban (ELIQUIS) 5 MG TABS tablet, Take 1 tablet (5 mg total) by mouth 2 (two) times daily. (Patient taking differently: Take by mouth 2 (two) times daily. ), Disp: 180 tablet, Rfl: 4 .  ascorbic acid (VITAMIN C) 250 MG tablet, Take 250 mg by mouth daily. , Disp: , Rfl:  .  Cranberry 500 MG CAPS, Take 1 capsule by mouth daily. , Disp: , Rfl:  .  Cyanocobalamin (B-12) 1000 MCG CAPS, Take 1,000 mcg by mouth daily. , Disp: , Rfl:  .  diclofenac sodium (VOLTAREN) 1 % GEL, diclofenac 1 %  topical gel  APPLY 2 GRAMS TO AFFECTED AREA 4 TIMES A DAY, Disp: , Rfl:  .  ferrous sulfate 325 (65 FE) MG tablet, Take 325 mg by mouth daily with breakfast., Disp: , Rfl:  .  flecainide (TAMBOCOR) 100 MG tablet, Take 1 tablet (100 mg total) by mouth 2 (two) times daily., Disp: 180 tablet, Rfl: 3 .  gabapentin (NEURONTIN) 400 MG capsule, Take 1 capsule (400 mg total) by mouth daily., Disp: 90 capsule, Rfl: 1 .  HYDROcodone-acetaminophen (NORCO/VICODIN) 5-325 MG tablet, Take 1 tablet by mouth 2 (two) times daily., Disp: 60 tablet, Rfl: 0 .  [START ON 04/18/2019] HYDROcodone-acetaminophen (NORCO/VICODIN) 5-325 MG tablet, Take 1 tablet by mouth 2 (two) times daily., Disp: 60 tablet, Rfl: 0 .  hyoscyamine (LEVSIN, ANASPAZ) 0.125 MG tablet, Take 0.125 mg by mouth every 6 (six) hours as needed for bladder spasms or cramping. , Disp: , Rfl:  .  LORazepam (ATIVAN) 1 MG tablet, TAKE 1 TABLET (1 MG TOTAL) BY MOUTH AT BEDTIME. FOR FURTHER REFILLS NEEDS TO SEE PROVIDER., Disp: 30 tablet, Rfl: 0 .  Magnesium 400 MG TABS, Take 400 mg by mouth daily. , Disp: , Rfl:  .  Melatonin 10 MG TABS, Take 10 mg by mouth at bedtime. , Disp: , Rfl:  .  miconazole (MICONAZOLE ANTIFUNGAL) 2 % cream, Apply 1 application topically 2 (two) times daily., Disp: 28.35 g, Rfl: 0 .  Multiple Vitamin (MULTI-VITAMINS) TABS, Take 1 tablet by mouth daily. , Disp: , Rfl:  .  Omega-3 Fatty Acids (FISH OIL) 1200 MG CAPS, Take 1,200 mg by mouth daily., Disp: , Rfl:  .  oxymetazoline (AFRIN) 0.05 % nasal spray, Place 2 sprays into both nostrils at bedtime as needed for congestion., Disp: , Rfl:  .  pantoprazole (PROTONIX) 40 MG tablet, TAKE 1 TABLET (40 MG TOTAL) BY MOUTH ONCE DAILY TAKE 30 MINS BEFORE MEAL, Disp: , Rfl: 6 .  Probiotic Product ([place]) CAPS, Take by mouth., Disp: , Rfl:  .  QUEtiapine Fumarate (SEROQUEL XR) 150 MG 24 hr tablet, TAKE 1 TABLET (150 MG TOTAL) BY MOUTH AT BEDTIME., Disp: 90 tablet, Rfl: 4 .   simvastatin (ZOCOR) 20 MG tablet, Take 1 tablet (20 mg total) by mouth daily., Disp: 90 tablet, Rfl: 4 .  solifenacin (VESICARE) 10 MG tablet, Take 1 tablet (10 mg total) by mouth daily., Disp: 90 tablet, Rfl: 4 .  venlafaxine XR (EFFEXOR XR) 75 MG 24 hr capsule, Take 3 capsules (225 mg total) by mouth daily., Disp: 270 capsule, Rfl: 4  Assessment and Plan: 1. Lumbar radiculopathy   2. Degeneration of lumbar intervertebral disc   3. Chronic, continuous use of opioids   4. Chronic pain syndrome   5. Bilateral hand pain   6. Facet syndrome, lumbar   7. Osteoarthritis of right hip, unspecified osteoarthritis type   8. Facet arthritis of lumbosacral region   9. Bilateral sciatica   Based on her review today and upon our discussion I will refill his medications.  I did review the [facility] practitioner database information and it is appropriate.  Refills are going to be given for February 20 26 March 2024 with return to clinic in 2 months I have encouraged him to continue follow-up with his primary care physicians for his baseline medical care.  Follow Up Instructions:    I discussed the assessment and treatment plan with the patient. The patient was provided an opportunity to ask questions and all were answered. The patient agreed with the plan and demonstrated an understanding of the instructions.   The patient was advised to call back or seek an in-person evaluation if the symptoms worsen or if the condition fails to improve as anticipated.  I provided 30 minutes of non-face-to-face time during this encounter.   [name], MD

## 2019-03-19 NOTE — Progress Notes (Signed)
Virtual Visit via Telephone Note  I connected with Nicholas [name] on @TODAY @ at  1:45 PM EST by telephone and verified that I am speaking with the correct person using two identifiers.  Location: Patient: Home Provider: Pain control center   I discussed the limitations, risks, security and privacy concerns of performing an evaluation and management service by telephone and the availability of in person appointments. I also discussed with the patient that there may be a patient responsible charge related to this service. The patient expressed understanding and agreed to proceed.   History of Present Illness: I was able to reach Nicholas [name] via telephone today.  He is unable to do the video portion of the virtual conference.  He reports that he is doing well with his low back pain and staying active.  The quality characteristic and distribution of been stable in nature.  He continues to play golf and takes his opioids twice a day which is consistent with his long-term therapy.  He gets good relief from these and is sleeping well.  He is able to stay active and based on our discussion continues to derive good functional improvement with the medications.  No change in the quality of his pain are noted and his strength is been good.    Observations/Objective:  Current Outpatient Medications:  .  acetaminophen (TYLENOL) 500 MG tablet, Take 2,000 mg by mouth daily as needed for moderate pain or headache., Disp: , Rfl:  .  apixaban (ELIQUIS) 5 MG TABS tablet, Take 1 tablet (5 mg total) by mouth 2 (two) times daily. (Patient taking differently: Take by mouth 2 (two) times daily. ), Disp: 180 tablet, Rfl: 4 .  ascorbic acid (VITAMIN C) 250 MG tablet, Take 250 mg by mouth daily. , Disp: , Rfl:  .  Cranberry 500 MG CAPS, Take 1 capsule by mouth daily. , Disp: , Rfl:  .  Cyanocobalamin (B-12) 1000 MCG CAPS, Take 1,000 mcg by mouth daily. , Disp: , Rfl:  .  diclofenac sodium (VOLTAREN) 1 % GEL, diclofenac 1 %  topical gel  APPLY 2 GRAMS TO AFFECTED AREA 4 TIMES A DAY, Disp: , Rfl:  .  ferrous sulfate 325 (65 FE) MG tablet, Take 325 mg by mouth daily with breakfast., Disp: , Rfl:  .  flecainide (TAMBOCOR) 100 MG tablet, Take 1 tablet (100 mg total) by mouth 2 (two) times daily., Disp: 180 tablet, Rfl: 3 .  gabapentin (NEURONTIN) 400 MG capsule, Take 1 capsule (400 mg total) by mouth daily., Disp: 90 capsule, Rfl: 1 .  HYDROcodone-acetaminophen (NORCO/VICODIN) 5-325 MG tablet, Take 1 tablet by mouth 2 (two) times daily., Disp: 60 tablet, Rfl: 0 .  [START ON 04/18/2019] HYDROcodone-acetaminophen (NORCO/VICODIN) 5-325 MG tablet, Take 1 tablet by mouth 2 (two) times daily., Disp: 60 tablet, Rfl: 0 .  hyoscyamine (LEVSIN, ANASPAZ) 0.125 MG tablet, Take 0.125 mg by mouth every 6 (six) hours as needed for bladder spasms or cramping. , Disp: , Rfl:  .  LORazepam (ATIVAN) 1 MG tablet, TAKE 1 TABLET (1 MG TOTAL) BY MOUTH AT BEDTIME. FOR FURTHER REFILLS NEEDS TO SEE PROVIDER., Disp: 30 tablet, Rfl: 0 .  Magnesium 400 MG TABS, Take 400 mg by mouth daily. , Disp: , Rfl:  .  Melatonin 10 MG TABS, Take 10 mg by mouth at bedtime. , Disp: , Rfl:  .  miconazole (MICONAZOLE ANTIFUNGAL) 2 % cream, Apply 1 application topically 2 (two) times daily., Disp: 28.35 g, Rfl: 0 .  Multiple Vitamin (MULTI-VITAMINS) TABS, Take 1 tablet by mouth daily. , Disp: , Rfl:  .  Omega-3 Fatty Acids (FISH OIL) 1200 MG CAPS, Take 1,200 mg by mouth daily., Disp: , Rfl:  .  oxymetazoline (AFRIN) 0.05 % nasal spray, Place 2 sprays into both nostrils at bedtime as needed for congestion., Disp: , Rfl:  .  pantoprazole (PROTONIX) 40 MG tablet, TAKE 1 TABLET (40 MG TOTAL) BY MOUTH ONCE DAILY TAKE 30 MINS BEFORE MEAL, Disp: , Rfl: 6 .  Probiotic Product ([place]) CAPS, Take by mouth., Disp: , Rfl:  .  QUEtiapine Fumarate (SEROQUEL XR) 150 MG 24 hr tablet, TAKE 1 TABLET (150 MG TOTAL) BY MOUTH AT BEDTIME., Disp: 90 tablet, Rfl: 4 .   simvastatin (ZOCOR) 20 MG tablet, Take 1 tablet (20 mg total) by mouth daily., Disp: 90 tablet, Rfl: 4 .  solifenacin (VESICARE) 10 MG tablet, Take 1 tablet (10 mg total) by mouth daily., Disp: 90 tablet, Rfl: 4 .  venlafaxine XR (EFFEXOR XR) 75 MG 24 hr capsule, Take 3 capsules (225 mg total) by mouth daily., Disp: 270 capsule, Rfl: 4  Assessment and Plan: 1. Lumbar radiculopathy   2. Degeneration of lumbar intervertebral disc   3. Chronic, continuous use of opioids   4. Chronic pain syndrome   5. Bilateral hand pain   6. Facet syndrome, lumbar   7. Osteoarthritis of right hip, unspecified osteoarthritis type   8. Facet arthritis of lumbosacral region   9. Bilateral sciatica   Based on her review today and upon our discussion I will refill his medications.  I did review the [facility] practitioner database information and it is appropriate.  Refills are going to be given for February 20 26 March 2024 with return to clinic in 2 months I have encouraged him to continue follow-up with his primary care physicians for his baseline medical care.  Follow Up Instructions:    I discussed the assessment and treatment plan with the patient. The patient was provided an opportunity to ask questions and all were answered. The patient agreed with the plan and demonstrated an understanding of the instructions.   The patient was advised to call back or seek an in-person evaluation if the symptoms worsen or if the condition fails to improve as anticipated.  I provided 30 minutes of non-face-to-face time during this encounter.   [name], MD

## 2019-03-21 ENCOUNTER — Ambulatory Visit: Payer: Self-pay

## 2019-03-21 NOTE — Chronic Care Management (AMB) (Signed)
  Care Management   Follow Up Note   03/21/2019 Name: TREYLEN NIGG MRN: [id number] DOB: 06-Mar-1945  Referred by: [name], MD Reason for referral : Care Coordination   JERAMIH BATTANI is a 74 y.o. year old male who is a primary care patient of Crissman, Jeannette How, MD. The care management team was consulted for assistance with care management and care coordination needs.    Review of patient status, including review of consultants reports, relevant laboratory and other test results, and collaboration with appropriate care team members and the patient's provider was performed as part of comprehensive patient evaluation and provision of chronic care management services.    LCSW completed CCM outreach attempt today but was unable to reach patient successfully. A HIPPA compliant voice message was left encouraging patient to return call once available. LCSW rescheduled CCM SW appointment as well.  A HIPPA compliant phone message was left for the patient providing contact information and requesting a return call.   Eula Fried, BSW, MSW, [facility] Care Management [email or web address] Phone: [phone]

## 2019-03-25 ENCOUNTER — Encounter: Payer: Self-pay | Admitting: Internal Medicine

## 2019-03-25 ENCOUNTER — Other Ambulatory Visit: Payer: Self-pay

## 2019-03-25 ENCOUNTER — Ambulatory Visit (INDEPENDENT_AMBULATORY_CARE_PROVIDER_SITE_OTHER): Payer: Medicare Other | Admitting: Internal Medicine

## 2019-03-25 ENCOUNTER — Telehealth: Payer: Self-pay | Admitting: Internal Medicine

## 2019-03-25 VITALS — BP 110/72 | HR 90 | Ht 71.0 in | Wt 281.0 lb

## 2019-03-25 DIAGNOSIS — Z79899 Other long term (current) drug therapy: Secondary | ICD-10-CM

## 2019-03-25 DIAGNOSIS — I480 Paroxysmal atrial fibrillation: Secondary | ICD-10-CM | POA: Diagnosis not present

## 2019-03-25 DIAGNOSIS — I441 Atrioventricular block, second degree: Secondary | ICD-10-CM | POA: Diagnosis not present

## 2019-03-25 DIAGNOSIS — I442 Atrioventricular block, complete: Secondary | ICD-10-CM

## 2019-03-25 DIAGNOSIS — Z950 Presence of cardiac pacemaker: Secondary | ICD-10-CM | POA: Diagnosis not present

## 2019-03-25 MED ORDER — MIDODRINE HCL 5 MG PO TABS: ORAL_TABLET | ORAL | 6 refills | Status: DC

## 2019-03-25 NOTE — Patient Instructions (Signed)
Medication Instructions:  - Your physician has recommended you make the following change in your medication:   1) Start Proamitine 5 mg- take 1 tablet  in the morning after you wake up & take 1 tablet in the afternoon after waking from you nap  *If you need a refill on your cardiac medications before your next appointment, please call your pharmacy*   Lab Work: - Your physician recommends that you have lab work today: BMP/ CBC  If you have labs (blood work) drawn today and your tests are completely normal, you will receive your results only by: Marland Kitchen MyChart Message (if you have MyChart) OR . A paper copy in the mail If you have any lab test that is abnormal or we need to change your treatment, we will call you to review the results.   Testing/Procedures: - none ordered   Follow-Up: At [facility], you and your health needs are our priority.  As part of our continuing mission to provide you with exceptional heart care, we have created designated Provider Care Teams.  These Care Teams include your primary Cardiologist (physician) and Advanced Practice Providers (APPs -  Physician Assistants and Nurse Practitioners) who all work together to provide you with the care you need, when you need it.  We recommend signing up for the patient portal called "MyChart".  Sign up information is provided on this After Visit Summary.  MyChart is used to connect with patients for Virtual Visits (Telemedicine).  Patients are able to view lab/test results, encounter notes, upcoming appointments, etc.  Non-urgent messages can be sent to your provider as well.   To learn more about what you can do with MyChart, go to [email or web address].    Your next appointment:   6 month(s)  The format for your next appointment:   Either In Person or Virtual  Provider:   Virl Axe, MD   Other Instructions n/a

## 2019-03-25 NOTE — Telephone Encounter (Signed)
Virtual Visit Pre-Appointment Phone Call  "(Name), I am calling you today to discuss your upcoming appointment. We are currently trying to limit exposure to the virus that causes COVID-19 by seeing patients at home rather than in the office."  1. "What is the BEST phone number to call the day of the visit?" - include this in appointment notes  2. Do you have or have access to (through a family member/friend) a smartphone with video capability that we can use for your visit?" a. If yes - list this number in appt notes as cell (if different from BEST phone #) and list the appointment type as a VIDEO visit in appointment notes b. If no - list the appointment type as a PHONE visit in appointment notes  3. Confirm consent - "In the setting of the current Covid19 crisis, you are scheduled for a (phone or video) visit with your provider on (date) at (time).  Just as we do with many in-office visits, in order for you to participate in this visit, we must obtain consent.  If you'd like, I can send this to your mychart (if signed up) or email for you to review.  Otherwise, I can obtain your verbal consent now.  All virtual visits are billed to your insurance company just like a normal visit would be.  By agreeing to a virtual visit, we'd like you to understand that the technology does not allow for your provider to perform an examination, and thus may limit your provider's ability to fully assess your condition. If your provider identifies any concerns that need to be evaluated in person, we will make arrangements to do so.  Finally, though the technology is pretty good, we cannot assure that it will always work on either your or our end, and in the setting of a video visit, we may have to convert it to a phone-only visit.  In either situation, we cannot ensure that we have a secure connection.  Are you willing to proceed?" STAFF: Did the patient verbally acknowledge consent to telehealth visit? Document  YES/NO here: YES  4. Advise patient to be prepared - "Two hours prior to your appointment, go ahead and check your blood pressure, pulse, oxygen saturation, and your weight (if you have the equipment to check those) and write them all down. When your visit starts, your provider will ask you for this information. If you have an Apple Watch or Kardia device, please plan to have heart rate information ready on the day of your appointment. Please have a pen and paper handy nearby the day of the visit as well."  5. Give patient instructions for MyChart download to smartphone OR [email or web address] as below if video visit (depending on what platform provider is using)  6. Inform patient they will receive a phone call 15 minutes prior to their appointment time (may be from unknown caller ID) so they should be prepared to answer    TELEPHONE CALL NOTE  Nicholas [name] has been deemed a candidate for a follow-up tele-health visit to limit community exposure during the Covid-19 pandemic. I spoke with the patient via phone to ensure availability of phone/video source, confirm preferred email & phone number, and discuss instructions and expectations.  I reminded Nicholas [name] to be prepared with any vital sign and/or heart rhythm information that could potentially be obtained via home monitoring, at the time of his visit. I reminded Nicholas [name] to expect a phone call prior to  his visit.  [name] 03/25/2019 1:09 PM   INSTRUCTIONS FOR DOWNLOADING THE MYCHART APP TO SMARTPHONE  - The patient must first make sure to have activated MyChart and know their login information - If Apple, go to [employer] and type in MyChart in the search bar and download the app. If Android, ask patient to go to [employer] and type in [place] in the search bar and download the app. The app is free but as with any other app downloads, their phone may require them to verify saved payment information or Apple/Android  password.  - The patient will need to then log into the app with their MyChart username and password, and select [place] as their healthcare provider to link the account. When it is time for your visit, go to the MyChart app, find appointments, and click Begin Video Visit. Be sure to Select Allow for your device to access the Microphone and Camera for your visit. You will then be connected, and your provider will be with you shortly.  **If they have any issues connecting, or need assistance please contact MyChart service desk [phone])**  **If using a computer, in order to ensure the best quality for their visit they will need to use either of the following Internet Browsers: [employer], or Google Chrome**  IF USING DOXIMITY or DOXY.ME - The patient will receive a link just prior to their visit by text.     FULL LENGTH CONSENT FOR TELE-HEALTH VISIT   I hereby voluntarily request, consent and authorize [facility] and its employed or contracted physicians, physician assistants, nurse practitioners or other licensed health care professionals (the Practitioner), to provide me with telemedicine health care services (the Services") as deemed necessary by the treating Practitioner. I acknowledge and consent to receive the Services by the Practitioner via telemedicine. I understand that the telemedicine visit will involve communicating with the Practitioner through live audiovisual communication technology and the disclosure of certain medical information by electronic transmission. I acknowledge that I have been given the opportunity to request an in-person assessment or other available alternative prior to the telemedicine visit and am voluntarily participating in the telemedicine visit.  I understand that I have the right to withhold or withdraw my consent to the use of telemedicine in the [name] of my care at any time, without affecting my right to future care or treatment,  and that the Practitioner or I may terminate the telemedicine visit at any time. I understand that I have the right to inspect all information obtained and/or recorded in the [name] of the telemedicine visit and may receive copies of available information for a reasonable fee.  I understand that some of the potential risks of receiving the Services via telemedicine include:   Delay or interruption in medical evaluation due to technological equipment failure or disruption;  Information transmitted may not be sufficient (e.g. poor resolution of images) to allow for appropriate medical decision making by the Practitioner; and/or   In rare instances, security protocols could fail, causing a breach of personal health information.  Furthermore, I acknowledge that it is my responsibility to provide information about my medical history, conditions and care that is complete and accurate to the best of my ability. I acknowledge that Practitioner's advice, recommendations, and/or decision may be based on factors not within their control, such as incomplete or inaccurate data provided by me or distortions of diagnostic images or specimens that may result from electronic transmissions. I  understand that the practice of medicine is not an exact science and that Practitioner makes no warranties or guarantees regarding treatment outcomes. I acknowledge that I will receive a copy of this consent concurrently upon execution via email to the email address I last provided but may also request a printed copy by calling the office of [place].    I understand that my insurance will be billed for this visit.   I have read or had this consent read to me.  I understand the contents of this consent, which adequately explains the benefits and risks of the Services being provided via telemedicine.   I have been provided ample opportunity to ask questions regarding this consent and the Services and have had my questions  answered to my satisfaction.  I give my informed consent for the services to be provided through the use of telemedicine in my medical care  By participating in this telemedicine visit I agree to the above.

## 2019-03-25 NOTE — Progress Notes (Signed)
Patient Care Team: [name], MD as PCP - General (Family Medicine) [name], MD (Specialist) Hollice Espy, MD as Consulting Physician (Urology) Tanda Rockers, MD as Consulting Physician (Pulmonary Disease) [name], MD as Consulting Physician (Anesthesiology) Deboraha Sprang, MD as Consulting Physician (Cardiology)   HPI  Nicholas [name] is a 74 y.o. 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  It was elected to start him on flecainide.   He has obstructive sleep apnea by history but has not been interested in testing or therapy   The patient denies chest pain, shortness of breath, nocturnal dyspnea, orthopnea or peripheral edema.  There have been no palpitations he has had problems with lightheadedness.  He has good days and bad days.  Sometimes he can finish playing golf sometimes he cannot.  He clearly makes his symptoms worse.  He has known orthostatic hypotension.  Reviewing the data in the chart, there are 2 episodes in 2019 documented.  1 the blood pressure systolic went 123XX123; the other went 124--87 with standing.    DATE TEST EF   10/15 echo     50 %   Mild RV dysfunction   11/17 echo    60 %    Nl RV function   11/17 Myoview   [facility])     hxstory of pulmonary embolism with repeat venous Dopplers 1/16 demonstrated small clots Managed with chronic apixaban  DATE PR interval QRSduration Dose  3/19 228 126 (HIS pacing) 100  3/21 P-synchronous   pacing  124 ( His pacing) 100        Date Cr Hgb  6/19 1.08 14.6  6/20 1.13 14.7      Thromboembolic risk factors ( age -90 , HTN-1) for a CHADSVASc Score of 2      Past Medical History:  Diagnosis Date  . Anterior urethral stricture   . Anxiety   . Arthritis      a. knees, hips, hands;  b. 11/2013 s/p L TKA @ [facility].  . Bile reflux gastritis   . Bulging lumbar disc   . BXO (balanitis xerotica obliterans)   . 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 Depression   . DVT (deep venous thrombosis) ([place])   . Erosive esophagitis   . Gross hematuria   . Hyperlipemia   . Hypertension    borderline  . Internal hemorrhoids   . Phimosis   . Pulmonary embolism [facility])     Past Surgical History:  Procedure Laterality Date  . BUNIONECTOMY Bilateral 01/06/2015   Procedure: BUNIONECTOMY;  Surgeon: [name], MD;  Location: [facility] ORS;  Service: Orthopedics;  Laterality: Bilateral;  . CARDIAC CATHETERIZATION  ~ 2005   "once"  . CATARACT EXTRACTION W/ INTRAOCULAR LENS  IMPLANT, BILATERAL Bilateral ~ 2010  . COLONOSCOPY WITH PROPOFOL N/A 12/07/2015   Procedure: COLONOSCOPY WITH PROPOFOL;  Surgeon: Lollie Sails, MD;  Location: [facility] ENDOSCOPY;  Service: Endoscopy;  Laterality: N/A;  . EP IMPLANTABLE DEVICE N/A 02/16/2016   MDT dual chamber (His Bundle) pacemaker implanted by Dr Caryl Comes for intermittent complete heart block  . ESOPHAGOGASTRODUODENOSCOPY (EGD) WITH PROPOFOL N/A 12/07/2015   Procedure: ESOPHAGOGASTRODUODENOSCOPY (EGD) WITH PROPOFOL;  Surgeon: Lollie Sails, MD;  Location: [facility] ENDOSCOPY;  Service: Endoscopy;  Laterality: N/A;  . ESOPHAGOGASTRODUODENOSCOPY (EGD) WITH PROPOFOL N/A 05/17/2017   Procedure: ESOPHAGOGASTRODUODENOSCOPY (EGD) WITH PROPOFOL;  Surgeon: Lollie Sails, MD;  Location: [facility] ENDOSCOPY;  Service: Endoscopy;  Laterality: N/A;  . HAMMER TOE SURGERY Bilateral 01/06/2015   Procedure: HAMMER TOE CORRECTION;  Surgeon: [name], MD;  Location: [facility] ORS;  Service: Orthopedics;  Laterality: Bilateral;  . KNEE CARTILAGE SURGERY Left 1965   "football injury"  . Left Total Knee Arthroplasty     a. 11/2013 [facility].  Marland Kitchen PILONIDAL CYST EXCISION  1970's  . TOTAL HIP  ARTHROPLASTY Right 2004  . TOTAL HIP ARTHROPLASTY Left 2006  . UPPER GI ENDOSCOPY      Current Outpatient Medications  Medication Sig Dispense Refill  . acetaminophen (TYLENOL) 500 MG tablet Take 2,000 mg by mouth daily as needed for moderate pain or headache.    Marland Kitchen apixaban (ELIQUIS) 5 MG TABS tablet Take 1 tablet (5 mg total) by mouth 2 (two) times daily. (Patient taking differently: Take by mouth 2 (two) times daily. ) 180 tablet 4  . ascorbic acid (VITAMIN C) 250 MG tablet Take 250 mg by mouth daily.     . Cranberry 500 MG CAPS Take 1 capsule by mouth daily.     . Cyanocobalamin (B-12) 1000 MCG CAPS Take 1,000 mcg by mouth daily.     . ferrous sulfate 325 (65 FE) MG tablet Take 325 mg by mouth daily with breakfast.    . flecainide (TAMBOCOR) 100 MG tablet Take 1 tablet (100 mg total) by mouth 2 (two) times daily. 180 tablet 3  . gabapentin (NEURONTIN) 400 MG capsule Take 1 capsule (400 mg total) by mouth daily. 90 capsule 1  . HYDROcodone-acetaminophen (NORCO/VICODIN) 5-325 MG tablet Take 1 tablet by mouth 2 (two) times daily. 60 tablet 0  . [START ON 04/18/2019] HYDROcodone-acetaminophen (NORCO/VICODIN) 5-325 MG tablet Take 1 tablet by mouth 2 (two) times daily. 60 tablet 0  . hyoscyamine (LEVSIN, ANASPAZ) 0.125 MG tablet Take 0.125 mg by mouth every 6 (six) hours as needed for bladder spasms or cramping.     Marland Kitchen LORazepam (ATIVAN) 1 MG tablet TAKE 1 TABLET (1 MG TOTAL) BY MOUTH AT BEDTIME. FOR FURTHER REFILLS NEEDS TO SEE PROVIDER. 30 tablet 0  . Magnesium 400 MG TABS Take 400 mg by mouth daily.     . Melatonin 10 MG TABS Take 10 mg by mouth at bedtime.     . miconazole (MICONAZOLE ANTIFUNGAL) 2 % cream Apply 1 application topically 2 (two) times daily. 28.35 g 0  . Multiple Vitamin (MULTI-VITAMINS) TABS Take 1 tablet by mouth daily.     . Omega-3 Fatty Acids (FISH OIL) 1200 MG CAPS Take 1,200 mg by mouth daily.    Marland Kitchen oxymetazoline (AFRIN) 0.05 % nasal spray Place 2 sprays into both nostrils  at bedtime as needed for congestion.    . pantoprazole (PROTONIX) 40 MG tablet TAKE 1 TABLET (40 MG TOTAL) BY MOUTH ONCE DAILY TAKE 30 MINS BEFORE MEAL  6  . Probiotic Product ([place]) CAPS Take by mouth.    . QUEtiapine Fumarate (SEROQUEL XR) 150 MG 24 hr tablet TAKE 1 TABLET (150 MG TOTAL) BY MOUTH AT BEDTIME. 90 tablet 4  . simvastatin (ZOCOR) 20 MG tablet Take 1 tablet (20 mg total) by mouth daily. 90 tablet 4  . solifenacin (VESICARE) 10 MG tablet Take 1 tablet (10 mg total) by mouth daily.  90 tablet 4  . venlafaxine XR (EFFEXOR XR) 75 MG 24 hr capsule Take 3 capsules (225 mg total) by mouth daily. 270 capsule 4   No current facility-administered medications for this visit.    No Known Allergies    Review of Systems negative except from HPI and PMH  Physical Exam   BP 110/72 (BP Location: Left Arm, Patient Position: Sitting, Cuff Size: Large)   Pulse 90   Ht 5\' 11"  (1.803 m)   Wt 281 lb (127.5 kg)   SpO2 94%   BMI 39.19 kg/m  Well developed and well nourished in no acute distress HENT normal Neck supple with JVP-flat Clear Device pocket well healed; without hematoma or erythema.  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  Reviewed treatment options including increasing fluid, compressive wear, and vasoactive agents specifically ProAmatine.  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  He would prefer to use medications.  We discussed the possibility of drug interactions as well as side effects.  We will begin him on 2.5 mg twice a day of ProAmatine and can increase to 5 mg twice daily as needed.  He will be using it on days when he has dizzy and not on days when he has not.  Moreover discussed the importance of being aware of severe dizziness and the propensity to fall; he has fallen once recently and hit his head.  Continue in the context of anticoagulation needs to be very attentive to this

## 2019-03-26 ENCOUNTER — Ambulatory Visit (INDEPENDENT_AMBULATORY_CARE_PROVIDER_SITE_OTHER): Payer: Medicare Other | Admitting: Nurse Practitioner

## 2019-03-26 ENCOUNTER — Other Ambulatory Visit: Payer: Self-pay

## 2019-03-26 ENCOUNTER — Encounter: Payer: Self-pay | Admitting: Nurse Practitioner

## 2019-03-26 VITALS — BP 127/82 | HR 66 | Temp 98.4°F

## 2019-03-26 DIAGNOSIS — I951 Orthostatic hypotension: Secondary | ICD-10-CM | POA: Diagnosis not present

## 2019-03-26 DIAGNOSIS — F419 Anxiety disorder, unspecified: Secondary | ICD-10-CM | POA: Diagnosis not present

## 2019-03-26 DIAGNOSIS — E119 Type 2 diabetes mellitus without complications: Secondary | ICD-10-CM | POA: Insufficient documentation

## 2019-03-26 DIAGNOSIS — I10 Essential (primary) hypertension: Secondary | ICD-10-CM | POA: Diagnosis not present

## 2019-03-26 DIAGNOSIS — Z79899 Other long term (current) drug therapy: Secondary | ICD-10-CM | POA: Insufficient documentation

## 2019-03-26 DIAGNOSIS — E1169 Type 2 diabetes mellitus with other specified complication: Secondary | ICD-10-CM | POA: Insufficient documentation

## 2019-03-26 DIAGNOSIS — R7301 Impaired fasting glucose: Secondary | ICD-10-CM | POA: Insufficient documentation

## 2019-03-26 DIAGNOSIS — N1831 Chronic kidney disease, stage 3a: Secondary | ICD-10-CM | POA: Insufficient documentation

## 2019-03-26 DIAGNOSIS — E669 Obesity, unspecified: Secondary | ICD-10-CM | POA: Insufficient documentation

## 2019-03-26 DIAGNOSIS — F339 Major depressive disorder, recurrent, unspecified: Secondary | ICD-10-CM | POA: Diagnosis not present

## 2019-03-26 DIAGNOSIS — E785 Hyperlipidemia, unspecified: Secondary | ICD-10-CM

## 2019-03-26 LAB — BASIC METABOLIC PANEL
BUN/Creatinine Ratio: 14 (ref 10–24)
BUN: 21 mg/dL (ref 8–27)
CO2: 22 mmol/L (ref 20–29)
Calcium: 9.7 mg/dL (ref 8.6–10.2)
Chloride: 100 mmol/L (ref 96–106)
Creatinine, Ser: 1.46 mg/dL — ABNORMAL HIGH (ref 0.76–1.27)
GFR calc Af Amer: 54 mL/min/{1.73_m2} — ABNORMAL LOW (ref >59)
GFR calc non Af Amer: 47 mL/min/{1.73_m2} — ABNORMAL LOW (ref >59)
Glucose: 131 mg/dL — ABNORMAL HIGH (ref 65–99)
Potassium: 4.7 mmol/L (ref 3.5–5.2)
Sodium: 141 mmol/L (ref 134–144)

## 2019-03-26 LAB — CBC WITH DIFFERENTIAL/PLATELET
Basophils Absolute: 0.1 10*3/uL (ref 0.0–0.2)
Basos: 1 %
EOS (ABSOLUTE): 0.2 10*3/uL (ref 0.0–0.4)
Eos: 3 %
Hematocrit: 46.8 % (ref 37.5–51.0)
Hemoglobin: 15.6 g/dL (ref 13.0–17.7)
Immature Grans (Abs): 0.1 10*3/uL (ref 0.0–0.1)
Immature Granulocytes: 1 %
Lymphocytes Absolute: 1.3 10*3/uL (ref 0.7–3.1)
Lymphs: 17 %
MCH: 31 pg (ref 26.6–33.0)
MCHC: 33.3 g/dL (ref 31.5–35.7)
MCV: 93 fL (ref 79–97)
Monocytes Absolute: 0.7 10*3/uL (ref 0.1–0.9)
Monocytes: 9 %
Neutrophils Absolute: 5.6 10*3/uL (ref 1.4–7.0)
Neutrophils: 69 %
Platelets: 192 10*3/uL (ref 150–450)
RBC: 5.03 x10E6/uL (ref 4.14–5.80)
RDW: 12.5 % (ref 11.6–15.4)
WBC: 8 10*3/uL (ref 3.4–10.8)

## 2019-03-26 LAB — MICROALBUMIN, URINE WAIVED
Creatinine, Urine Waived: 200 mg/dL (ref 10–300)
Microalb, Ur Waived: 80 mg/L — ABNORMAL HIGH (ref 0–19)

## 2019-03-26 MED ORDER — LORAZEPAM 1 MG PO TABS: 1.0000 mg | ORAL_TABLET | Freq: Every day | ORAL | 0 refills | Status: DC

## 2019-03-26 NOTE — Assessment & Plan Note (Signed)
Chronic, ongoing.  Discussed at length risk of benzo and opioid use in conjunction with each other.  Recommend he not take the two together at same hour during day or evening.  Recommend he cut back on Ativan to 1/2 tablet, which he agrees to try.  Continue to collaborate with CCM team on education.  Provided wife and him with copy of [facility] benzo risk information patient sheet.  They request 90 day supply, as previous PCP supplied this.  Are aware he does have to return every 3 months for refills.  Obtain UDS and contract next visit.

## 2019-03-26 NOTE — Assessment & Plan Note (Addendum)
Noted on past labs since 2019 -- check A1C today.  Concern for T2DM and discussed with wife and patient.  Initiate medication as needed and continue to collaborate with CCM team.

## 2019-03-26 NOTE — Assessment & Plan Note (Signed)
Chronic, ongoing.  Continue current medication regimen and adjust as needed. Lipid panel today. 

## 2019-03-26 NOTE — Patient Instructions (Signed)

## 2019-03-26 NOTE — Assessment & Plan Note (Signed)
Chronic, ongoing.  Followed by cardiology.  BP at goal today in office.  With orthostatic BP presenting occasionally will continue current regimen at this time and have recommended utilizing compression hose at home, refuses this.  Continue collaboration with cardiology and current medication regimen.

## 2019-03-26 NOTE — Assessment & Plan Note (Signed)
Noted on recent cardiology labs.  Urine ALB 80 and A:C 30-300 today.  Check A1C.  Monitor medications and renal dose where possible.  If worsening labs in future will refer to nephrology.  Educated wife and patient on CKD.

## 2019-03-26 NOTE — Assessment & Plan Note (Signed)
Chronic, ongoing.  Denies SI/HI.  Continue current medication regimen and adjust as needed.  Would benefit from trial off Effexor and trial of SSRI, but refuses this.   

## 2019-03-26 NOTE — Assessment & Plan Note (Signed)
Continue collaboration with cardiology and medication regimen as prescribed by them.  Recommend ensuring good hydration throughout daytime hours + use of compression hose at home (on during daytime and off during night), refuses compression hose. 

## 2019-03-26 NOTE — Progress Notes (Signed)
BP 127/82   Pulse 66   Temp 98.4 F (36.9 C) (Oral)   SpO2 93%    Subjective:    Patient ID: Nicholas [name], male    DOB: 1946/01/16, 74 y.o.   MRN: [id number]  HPI: Nicholas [name] is a 74 y.o. male  Chief Complaint  Patient presents with  . Hypertension  . Depression  . Hyperlipidemia   Wife present at bedside with patient.  HYPERTENSION / HYPERLIPIDEMIA Followed by Dr. Caryl Comes and last seen yesterday, was started on ProAmatine due to dizziness and occasional dizziness with orthostatic BPs.  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  Does endorse having occasional episodes over past year or so of dizziness when changing positions from sitting to standing.  Does not wear compression hose, refuses to wear these. Continues on Simvastatin, Eliquis, fish oil. Satisfied with current treatment? yes Duration of hypertension: chronic BP monitoring frequency: daily BP range: 120 - 130/70-80 with occasional lower with standing BP medication side effects: no Duration of hyperlipidemia: chronic Cholesterol medication side effects: no Cholesterol supplements: fish oil Medication compliance: good compliance Aspirin: no Recent stressors: no Recurrent headaches: no Visual changes: no Palpitations: no Dyspnea: no Chest pain: no Lower extremity edema: no Dizzy/lightheaded: no   IFG: Noted on review past labs elevation in glucose ranging from 120 to 160 range since 2019 with no A1C noted.  Discussed with patient as recent cardiology labs noted some CKD present.   Polydipsia/polyuria: no Visual disturbance: no Chest pain: no Paresthesias: no   CHRONIC KIDNEY DISEASE Noted on cardiology labs from yesterday with GFR 47 and CRT 1.46, BUN 21.  Discussed with patient and his wife. CKD status: stable Medications renally dose: yes Previous renal evaluation: no Pneumovax:  Up to Date Influenza Vaccine:  Up to Date  DEPRESSION Is on Effexor, Seroquel, and Ativan.  Pt and his wife  at bedside made aware of risks of benzo medication use to include increased sedation, respiratory suppression, falls, extrapyramidal movements, dependence and cardiovascular events.  Pt and his wife would like to continue treatment as benefit determined to outweigh risk.  Discussed with him that he is also on opioid therapy, he takes Ativan at night when he also takes his Norco and his Seroquel.  Discussed risks with taking these three medications together at same time.  Has history of dx OSA in past, but does not want to pursue testing or further work-up, this is noted on recent cardiology note too.  He has not tried taking 1/2 tablet of Ativan as was instructed by CCM PharmD to try, but is willing to try this.  His wife and him report we can not take him off Ativan or Effexor as these keep his mood stable.  They report trying different things in past with no benefit.   Duration:stable Anxious mood: yes  Excessive worrying: no Irritability: no  Sweating: no Nausea: no Palpitations:no Hyperventilation: no Panic attacks: no Agoraphobia: no  Obscessions/compulsions: no Depressed mood: no Depression screen [facility] 2/9 03/26/2019 08/14/2018 04/18/2018 02/25/2018 12/24/2017  Decreased Interest 0 0 0 0 0  Down, Depressed, Hopeless 0 0 0 0 0  PHQ - 2 Score 0 0 0 0 0  Altered sleeping 0 1 - - -  Tired, decreased energy 1 3 - - -  Change in appetite 0 1 - - -  Feeling bad or failure about yourself  0 2 - - -  Trouble concentrating 0 0 - - -  Moving  slowly or fidgety/restless 0 0 - - -  Suicidal thoughts 0 0 - - -  PHQ-9 Score 1 7 - - -  Difficult doing work/chores Not difficult at all Not difficult at all - - -  Some recent data might be hidden    Relevant past medical, surgical, family and social history reviewed and updated as indicated. Interim medical history since our last visit reviewed. Allergies and medications reviewed and updated.  Review of Systems  Constitutional: Negative for activity  change, appetite change, fatigue and fever.  Respiratory: Negative for cough, chest tightness, shortness of breath and wheezing.   Cardiovascular: Negative for chest pain, palpitations and leg swelling.  Endocrine: Negative for polydipsia, polyphagia and polyuria.  Musculoskeletal: Negative.   Neurological: Negative.   Psychiatric/Behavioral: Positive for decreased concentration and sleep disturbance. Negative for self-injury and suicidal ideas. The patient is not nervous/anxious.     Per HPI unless specifically indicated above     Objective:    BP 127/82   Pulse 66   Temp 98.4 F (36.9 C) (Oral)   SpO2 93%   Wt Readings from Last 3 Encounters:  03/25/19 281 lb (127.5 kg)  09/24/18 282 lb (127.9 kg)  04/18/18 280 lb (127 kg)    Physical Exam Vitals and nursing note reviewed.  Constitutional:      General: He is awake. He is not in acute distress.    Appearance: He is well-developed and well-groomed. He is obese.  HENT:     Head: Normocephalic and atraumatic.     Right Ear: Hearing normal. No drainage.     Left Ear: Hearing normal. No drainage.  Eyes:     General: Lids are normal.        Right eye: No discharge.        Left eye: No discharge.     Conjunctiva/sclera: Conjunctivae normal.     Pupils: Pupils are equal, round, and reactive to light.  Neck:     Thyroid: No thyromegaly.     Vascular: No carotid bruit.  Cardiovascular:     Rate and Rhythm: Normal rate and regular rhythm.     Heart sounds: Normal heart sounds, S1 normal and S2 normal. No murmur. No gallop.   Pulmonary:     Effort: Pulmonary effort is normal. No accessory muscle usage or respiratory distress.     Breath sounds: Normal breath sounds.  Abdominal:     General: Bowel sounds are normal.     Palpations: Abdomen is soft.  Musculoskeletal:        General: Normal range of motion.     Cervical back: Normal range of motion and neck supple.     Right lower leg: No edema.     Left lower leg: No edema.   Skin:    General: Skin is warm and dry.  Neurological:     Mental Status: He is alert and oriented to person, place, and time.  Psychiatric:        Attention and Perception: Attention normal.        Mood and Affect: Mood normal.        Behavior: Behavior normal. Behavior is cooperative.        Thought Content: Thought content normal.    Results for orders placed or performed in visit on 03/26/19  Microalbumin, Urine Waived  Result Value Ref Range   Microalb, Ur Waived 80 (H) 0 - 19 mg/L   Creatinine, Urine Waived 200 10 - 300 mg/dL  Microalb/Creat Ratio 30-300 (H) <30 mg/g      Assessment & Plan:   Problem List Items Addressed This Visit      Cardiovascular and Mediastinum   HTN (hypertension) (Chronic)    Chronic, ongoing.  Followed by cardiology.  BP at goal today in office.  With orthostatic BP presenting occasionally will continue current regimen at this time and have recommended utilizing compression hose at home, refuses this.  Continue collaboration with cardiology and current medication regimen.      Chronic orthostatic hypotension    Continue collaboration with cardiology and medication regimen as prescribed by them.  Recommend ensuring good hydration throughout daytime hours + use of compression hose at home (on during daytime and off during night), refuses compression hose.        Endocrine   IFG (impaired fasting glucose)    Noted on past labs since 2019 -- check A1C today.  Concern for T2DM and discussed with wife and patient.  Initiate medication as needed and continue to collaborate with CCM team.        Relevant Orders   HgB A1c   Microalbumin, Urine Waived (Completed)     Genitourinary   Stage 3a chronic kidney disease    Noted on recent cardiology labs.  Urine ALB 80 and A:C 30-300 today.  Check A1C.  Monitor medications and renal dose where possible.  If worsening labs in future will refer to nephrology.  Educated wife and patient on CKD.         Other   Dyslipidemia (Chronic)    Chronic, ongoing.  Continue current medication regimen and adjust as needed. Lipid panel today.      Relevant Orders   Lipid Panel w/o Chol/HDL Ratio   Depression, recurrent (HCC) - Primary    Chronic, ongoing.  Denies SI/HI.  Continue current medication regimen and adjust as needed.  Would benefit from trial off Effexor and trial of SSRI, but refuses this.        Relevant Medications   LORazepam (ATIVAN) 1 MG tablet (Start on 04/14/2019)   Anxiety    Chronic, ongoing.  Discussed at length risk of benzo and opioid use in conjunction with each other.  Recommend he not take the two together at same hour during day or evening.  Recommend he cut back on Ativan to 1/2 tablet, which he agrees to try.  Continue to collaborate with CCM team on education.  Provided wife and him with copy of VA benzo risk information patient sheet.  They request 90 day supply, as previous PCP supplied this.  Are aware he does have to return every 3 months for refills.  Obtain UDS and contract next visit.      Relevant Medications   LORazepam (ATIVAN) 1 MG tablet (Start on 04/14/2019)   Long-term current use of benzodiazepine    Has been on Ativan for many years.  At length discussions about risk of opioid and benzo use had with patient and wife by both PCP and CCM PharmD.  Continue these discussion.  Refer to anxiety plan.         Time: 25 minutes, >50% spent counseling on CKD and diabetes + discussion of medications   Follow up plan: Return in about 3 months (around 06/26/2019) for HTN/HLD, MOOD, IFG.

## 2019-03-26 NOTE — Assessment & Plan Note (Signed)
Has been on Ativan for many years.  At length discussions about risk of opioid and benzo use had with patient and wife by both PCP and CCM PharmD.  Continue these discussion.  Refer to anxiety plan. 

## 2019-03-27 LAB — LIPID PANEL W/O CHOL/HDL RATIO
Cholesterol, Total: 187 mg/dL (ref 100–199)
HDL: 51 mg/dL (ref >39)
LDL Chol Calc (NIH): 109 mg/dL — ABNORMAL HIGH (ref 0–99)
Triglycerides: 152 mg/dL — ABNORMAL HIGH (ref 0–149)
VLDL Cholesterol Cal: 27 mg/dL (ref 5–40)

## 2019-03-27 LAB — HEMOGLOBIN A1C
Est. average glucose Bld gHb Est-mCnc: 134 mg/dL
Hgb A1c MFr Bld: 6.3 % — ABNORMAL HIGH (ref 4.8–5.6)

## 2019-03-27 NOTE — Progress Notes (Signed)
Contacted via MyChart

## 2019-03-31 ENCOUNTER — Ambulatory Visit (INDEPENDENT_AMBULATORY_CARE_PROVIDER_SITE_OTHER): Payer: Medicare Other | Admitting: Licensed Clinical Social Worker

## 2019-03-31 DIAGNOSIS — F339 Major depressive disorder, recurrent, unspecified: Secondary | ICD-10-CM | POA: Diagnosis not present

## 2019-03-31 DIAGNOSIS — I10 Essential (primary) hypertension: Secondary | ICD-10-CM | POA: Diagnosis not present

## 2019-03-31 DIAGNOSIS — G894 Chronic pain syndrome: Secondary | ICD-10-CM

## 2019-03-31 DIAGNOSIS — N1831 Chronic kidney disease, stage 3a: Secondary | ICD-10-CM

## 2019-03-31 DIAGNOSIS — F419 Anxiety disorder, unspecified: Secondary | ICD-10-CM

## 2019-03-31 DIAGNOSIS — I951 Orthostatic hypotension: Secondary | ICD-10-CM

## 2019-03-31 NOTE — Chronic Care Management (AMB) (Signed)
Chronic Care Management    Clinical Social Work Follow Up Note  03/31/2019 Name: Nicholas [name] MRN: [id number] DOB: 24-Oct-1945  Nicholas [name] is a 74 y.o. year old male who is a primary care patient of Cannady, Barbaraann Faster, NP. The CCM team was consulted for assistance with Level of Care Concerns.   Review of patient status, including review of consultants reports, other relevant assessments, and collaboration with appropriate care team members and the patient's provider was performed as part of comprehensive patient evaluation and provision of chronic care management services.    SDOH (Social Determinants of Health) assessments performed: Yes    Outpatient Encounter Medications as of 03/31/2019  Medication Sig  . acetaminophen (TYLENOL) 650 MG CR tablet Take 1,300 mg by mouth daily.  Marland Kitchen apixaban (ELIQUIS) 5 MG TABS tablet Take 1 tablet (5 mg total) by mouth 2 (two) times daily. (Patient taking differently: Take by mouth 2 (two) times daily. )  . ascorbic acid (VITAMIN C) 250 MG tablet Take 250 mg by mouth daily.   . Cranberry 500 MG CAPS Take 1 capsule by mouth daily.   . Cyanocobalamin (B-12) 1000 MCG CAPS Take 1,000 mcg by mouth daily.   . ferrous sulfate 325 (65 FE) MG tablet Take 325 mg by mouth daily with breakfast.  . flecainide (TAMBOCOR) 100 MG tablet Take 1 tablet (100 mg total) by mouth 2 (two) times daily.  Marland Kitchen gabapentin (NEURONTIN) 400 MG capsule Take 1 capsule (400 mg total) by mouth daily.  Marland Kitchen HYDROcodone-acetaminophen (NORCO/VICODIN) 5-325 MG tablet Take 1 tablet by mouth 2 (two) times daily.  Derrill Memo ON 04/18/2019] HYDROcodone-acetaminophen (NORCO/VICODIN) 5-325 MG tablet Take 1 tablet by mouth 2 (two) times daily.  . hyoscyamine (LEVSIN, ANASPAZ) 0.125 MG tablet Take 0.125 mg by mouth every 6 (six) hours as needed for bladder spasms or cramping.   Derrill Memo ON 04/14/2019] LORazepam (ATIVAN) 1 MG tablet Take 1 tablet (1 mg total) by mouth at bedtime.  . Magnesium 400 MG TABS Take 400  mg by mouth daily.   . Melatonin 10 MG TABS Take 10 mg by mouth at bedtime.   . miconazole (MICONAZOLE ANTIFUNGAL) 2 % cream Apply 1 application topically 2 (two) times daily. (Patient not taking: Reported on 03/26/2019)  . midodrine (PROAMATINE) 5 MG tablet Take 1 tablet (5 mg) in the morning after waking up and take 1 tablet (5 mg) in the afternoon after you nap  . Multiple Vitamin (MULTI-VITAMINS) TABS Take 1 tablet by mouth daily.   . Omega-3 Fatty Acids (FISH OIL) 1200 MG CAPS Take 1,200 mg by mouth daily.  Marland Kitchen oxymetazoline (AFRIN) 0.05 % nasal spray Place 2 sprays into both nostrils at bedtime as needed for congestion.  . pantoprazole (PROTONIX) 40 MG tablet TAKE 1 TABLET (40 MG TOTAL) BY MOUTH ONCE DAILY TAKE 30 MINS BEFORE MEAL  . Probiotic Product ([place]) CAPS Take by mouth.  . QUEtiapine Fumarate (SEROQUEL XR) 150 MG 24 hr tablet TAKE 1 TABLET (150 MG TOTAL) BY MOUTH AT BEDTIME.  . simvastatin (ZOCOR) 20 MG tablet Take 1 tablet (20 mg total) by mouth daily.  . solifenacin (VESICARE) 10 MG tablet Take 1 tablet (10 mg total) by mouth daily.  Marland Kitchen triamcinolone cream (KENALOG) 0.1 % Apply 1 application topically 2 (two) times daily.  Marland Kitchen venlafaxine XR (EFFEXOR XR) 75 MG 24 hr capsule Take 3 capsules (225 mg total) by mouth daily.   No facility-administered encounter medications on file as of 03/31/2019.  Goals Addressed    . SW: We need more help in the home for Tommy (pt-stated)       [place] (see longtitudinal plan of care for additional care plan information)  Current Barriers:  . Financial constraints related to managing health care expenses . Limited social support . ADL IADL limitations . Social Isolation . Limited access to caregiver . Inability to perform ADL's independently . Inability to perform IADL's independently  Clinical Social Work Clinical Goal(s):  Marland Kitchen Over the next 120 days, patient will work with SW to address concerns related to gaining  additional support within the home and [place] benefits  Interventions: . Patient interviewed and appropriate assessments performed . Provided patient with information about how to apply for VA benefits . Discussed plans with patient for ongoing care management follow up and provided patient with direct contact information for care management team . Advised patient to check email for sent community resources. Spouse agreeabel to contact [employer]. . Resources discussed- .  [employer] in [employer].  . Veterans [place] in [place] is a Environmental education officer where veterans are assisted and educated on obtaining benefits in which they earn serving their country. Here you, the veteran, are our priority. [employer] prides itself on offering the personal assistant and care, so many veterans need. . Mail address: [address] [address] [facility] [address] . Physical address: [place] . Contact person: Katrina Love . Phone: [phone] . Email: [email or web address] . [place] . [place], [place] [address] . [phone] . [facility] Benefit Office . [address], [place] [address] . Monday-Thursday, 8am-4pm . Friday, 9am-4pm . [employer]  . [phone] . As a [place] Oncologist or spouse, you can earn up to $3,032 a month TAX FREE to help you pay for your Senior Living. As a wartime hero, you've earned the Aid and Attendance benefit through the U.[place] Department of [employer]. . [employer] helps you streamline your VA benefit application and cuts through the government red tape to properly serve you. Every month, thousands of U.S. wartime heroes (and widows) collect millions of dollars paying for their Senior Living expenses. Email sent on 03/31/19 . Assisted patient/caregiver with obtaining information about health plan benefits . Provided education and assistance to client regarding Advanced  Directives. . Provided education to patient/caregiver regarding level of care options. . Provided education to patient/caregiver about Hospice and/or Palliative Care services  Patient Self Care Activities:  . Attends all scheduled provider appointments . Calls provider office for new concerns or questions . Lacks social connections  Initial goal documentation     Follow Up Plan: SW will follow up with patient by phone over the next quarter  Eula Fried, [place], MSW, [email or web address] Phone: [phone]

## 2019-04-03 LAB — CUP PACEART INCLINIC DEVICE CHECK
Battery Remaining Longevity: 57 mo
Battery Voltage: 2.99 V
Brady Statistic AP VP Percent: 24.43 %
Brady Statistic AP VS Percent: 0 %
Brady Statistic AS VP Percent: 75.55 %
Brady Statistic AS VS Percent: 0.02 %
Brady Statistic RA Percent Paced: 24.4 %
Brady Statistic RV Percent Paced: 99.96 %
Date Time Interrogation Session: 20210302181000
Implantable Lead Implant Date: 20180124
Implantable Lead Implant Date: 20180124
Implantable Lead Location: 753859
Implantable Lead Location: 753860
Implantable Lead Model: 3830
Implantable Lead Model: 5076
Implantable Pulse Generator Implant Date: 20180124
Lead Channel Impedance Value: 361 Ohm
Lead Channel Impedance Value: 380 Ohm
Lead Channel Impedance Value: 399 Ohm
Lead Channel Impedance Value: 494 Ohm
Lead Channel Pacing Threshold Amplitude: 0.5 V
Lead Channel Pacing Threshold Amplitude: 1 V
Lead Channel Pacing Threshold Pulse Width: 0.4 ms
Lead Channel Pacing Threshold Pulse Width: 1 ms
Lead Channel Sensing Intrinsic Amplitude: 1.75 mV
Lead Channel Sensing Intrinsic Amplitude: 3.625 mV
Lead Channel Setting Pacing Amplitude: 2 V
Lead Channel Setting Pacing Amplitude: 2.25 V
Lead Channel Setting Pacing Pulse Width: 1 ms
Lead Channel Setting Sensing Sensitivity: 0.9 mV

## 2019-04-07 ENCOUNTER — Ambulatory Visit: Payer: Medicare Other | Attending: Internal Medicine

## 2019-04-07 DIAGNOSIS — Z23 Encounter for immunization: Secondary | ICD-10-CM

## 2019-04-07 NOTE — Progress Notes (Signed)
   Covid-19 Vaccination Clinic  Name:  CLARA VIEYRA    MRN: [id number] DOB: [birth year]  04/07/2019  Mr. Medearis was observed post Covid-19 immunization for 15 minutes without incident. He was provided with Vaccine Information Sheet and instruction to access the V-Safe system.   Mr. Rucci was instructed to call 911 with any severe reactions post vaccine: Marland Kitchen Difficulty breathing  . Swelling of face and throat  . A fast heartbeat  . A bad rash all over body  . Dizziness and weakness   Immunizations Administered    Name Date Dose VIS Date Route   Moderna COVID-19 Vaccine 04/07/2019 10:10 AM 0.5 mL 12/24/2018 Intramuscular   Manufacturer: Moderna   Lot: [id number]   [id number]

## 2019-04-15 ENCOUNTER — Other Ambulatory Visit: Payer: Self-pay | Admitting: Nurse Practitioner

## 2019-04-21 ENCOUNTER — Ambulatory Visit (INDEPENDENT_AMBULATORY_CARE_PROVIDER_SITE_OTHER): Payer: Medicare Other

## 2019-04-21 VITALS — Wt 282.0 lb

## 2019-04-21 DIAGNOSIS — Z0000 Encounter for general adult medical examination without abnormal findings: Secondary | ICD-10-CM

## 2019-04-21 NOTE — Progress Notes (Signed)
Subjective:   Nicholas [name] is a 74 y.o. male who presents for Medicare Annual/Subsequent preventive examination.  This visit is being conducted via phone call  - after an attmept to do on video chat - due to the COVID-19 pandemic. This patient has given me verbal consent via phone to conduct this visit, patient states they are participating from their home address. Some vital signs may be absent or patient reported.   Patient identification: identified by name, DOB, and current address.    Review of Systems:   Cardiac Risk Factors include: male gender;advanced age (>44men, >5 women);dyslipidemia;hypertension     Objective:    Vitals: Wt 282 lb (127.9 kg)   BMI 39.33 kg/m   Body mass index is 39.33 kg/m.  Advanced Directives 04/21/2019 04/18/2018 02/25/2018 01/05/2018 06/22/2017 06/11/2017 05/17/2017  Does Patient Have a Medical Advance Directive? No No No No No No No  Type of Advance Directive - - - - - - -  Would patient like information on creating a medical advance directive? - (No Data) - - No - Patient declined No - Patient declined -  Pre-existing out of facility DNR order (yellow form or pink MOST form) - - - - - - -    Tobacco Social History   Tobacco Use  Smoking Status Never Smoker  Smokeless Tobacco Never Used     Counseling given: Not Answered   Clinical Intake:  Pre-visit preparation completed: Yes  Pain : No/denies pain     Nutritional Risks: None Diabetes: No  How often do you need to have someone help you when you read instructions, pamphlets, or other written materials from your doctor or pharmacy?: 1 - Never  Interpreter Needed?: No  Information entered by :: Kindra Bickham,LPN  Past Medical History:  Diagnosis Date  . Anterior urethral stricture   . Anxiety   . Arthritis    a. knees, hips, hands;  b. 11/2013 s/p L TKA @ [place].  . Bile reflux gastritis   . Bulging lumbar disc   . BXO (balanitis xerotica obliterans)   . Complete heart block  (HCC)    a. 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 Depression   . DVT (deep venous thrombosis) ([place])   . Erosive esophagitis   . Gross hematuria   . Hyperlipemia   . Hypertension    borderline  . Internal hemorrhoids   . Phimosis   . Pulmonary embolism [facility])    Past Surgical History:  Procedure Laterality Date  . BUNIONECTOMY Bilateral 01/06/2015   Procedure: BUNIONECTOMY;  Surgeon: [name], MD;  Location: [facility] ORS;  Service: Orthopedics;  Laterality: Bilateral;  . CARDIAC CATHETERIZATION  ~ 2005   "once"  . CATARACT EXTRACTION W/ INTRAOCULAR LENS  IMPLANT, BILATERAL Bilateral ~ 2010  . COLONOSCOPY WITH PROPOFOL N/A 12/07/2015   Procedure: COLONOSCOPY WITH PROPOFOL;  Surgeon: Lollie Sails, MD;  Location[facility] ENDOSCOPY;  Service: Endoscopy;  Laterality: N/A;  . EP IMPLANTABLE DEVICE N/A 02/16/2016   MDT dual chamber (His Bundle) pacemaker implanted by Dr Caryl Comes for intermittent complete heart block  . ESOPHAGOGASTRODUODENOSCOPY (EGD) WITH PROPOFOL N/A 12/07/2015   Procedure: ESOPHAGOGASTRODUODENOSCOPY (EGD) WITH PROPOFOL;  Surgeon: Lollie Sails, MD;  Location: [facility] ENDOSCOPY;  Service: Endoscopy;  Laterality: N/A;  . ESOPHAGOGASTRODUODENOSCOPY (EGD) WITH PROPOFOL N/A 05/17/2017   Procedure: ESOPHAGOGASTRODUODENOSCOPY (EGD) WITH PROPOFOL;  Surgeon: Lollie Sails, MD;  Location: [facility] ENDOSCOPY;  Service: Endoscopy;  Laterality: N/A;  . HAMMER  TOE SURGERY Bilateral 01/06/2015   Procedure: HAMMER TOE CORRECTION;  Surgeon: [name], MD;  Location: [facility] ORS;  Service: Orthopedics;  Laterality: Bilateral;  . KNEE CARTILAGE SURGERY Left 1965   "football injury"  . Left Total Knee Arthroplasty     a. 11/2013 [facility].  Marland Kitchen PILONIDAL CYST EXCISION  1970's  . TOTAL HIP ARTHROPLASTY Right 2004  . TOTAL HIP ARTHROPLASTY Left 2006  . UPPER GI ENDOSCOPY     Family History  Problem Relation Age of Onset  . Stroke Father        deceased  . Diabetes  Father   . Hypertension Father   . Alcoholism Mother        died in her 69's.  . Cancer Mother   . Glaucoma Sister   . Breast cancer Sister   . Prostate cancer Paternal Grandfather    Social History   Socioeconomic History  . Marital status: Married    Spouse name: Not on file  . Number of children: Not on file  . Years of education: Not on file  . Highest education level: High school graduate  Occupational History  . Occupation: retired   Tobacco Use  . Smoking status: Never Smoker  . Smokeless tobacco: Never Used  Substance and Sexual Activity  . Alcohol use: Yes    Alcohol/week: 0.0 standard drinks    Comment: beers seldom   . Drug use: No  . Sexual activity: Not Currently  Other Topics Concern  . Not on file  Social History Narrative   Lives in [place] with wife.  Does not routinely exercise.  Activity severely limited by bilateral knee pain.   Social Determinants of Health   Financial Resource Strain: Low Risk   . Difficulty of Paying Living Expenses: Not hard at all  Food Insecurity: No Food Insecurity  . Worried About Charity fundraiser in the Last Year: Never true  . Ran Out of Food in the Last Year: Never true  Transportation Needs: No Transportation Needs  . Lack of Transportation (Medical): No  . Lack of Transportation (Non-Medical): No  Physical Activity: Inactive  . Days of Exercise per Week: 0 days  . Minutes of Exercise per Session: 0 min  Stress:   . Feeling of Stress :   Social Connections: Slightly Isolated  . Frequency of Communication with Friends and Family: More than three times a week  . Frequency of Social Gatherings with Friends and Family: More than three times a week  . Attends Religious Services: More than 4 times per year  . Active Member of Clubs or Organizations: No  . Attends Archivist Meetings: Never  . Marital Status: Married    Outpatient Encounter Medications as of 04/21/2019  Medication Sig  . acetaminophen  (TYLENOL) 650 MG CR tablet Take 650 mg by mouth daily.   Marland Kitchen apixaban (ELIQUIS) 5 MG TABS tablet Take 1 tablet (5 mg total) by mouth 2 (two) times daily. (Patient taking differently: Take by mouth 2 (two) times daily. )  . ascorbic acid (VITAMIN C) 250 MG tablet Take 250 mg by mouth daily.   . Cranberry 500 MG CAPS Take 1 capsule by mouth daily.   . Cyanocobalamin (B-12) 1000 MCG CAPS Take 1,000 mcg by mouth daily.   . ferrous sulfate 325 (65 FE) MG tablet Take 325 mg by mouth daily with breakfast.  . flecainide (TAMBOCOR) 100 MG tablet Take 1 tablet (100 mg total) by mouth 2 (two) times daily.  Marland Kitchen  gabapentin (NEURONTIN) 400 MG capsule Take 1 capsule (400 mg total) by mouth daily.  Marland Kitchen HYDROcodone-acetaminophen (NORCO/VICODIN) 5-325 MG tablet Take 1 tablet by mouth 2 (two) times daily.  . hyoscyamine (LEVSIN, ANASPAZ) 0.125 MG tablet Take 0.125 mg by mouth every 6 (six) hours as needed for bladder spasms or cramping.   Marland Kitchen LORazepam (ATIVAN) 1 MG tablet Take 1 tablet (1 mg total) by mouth at bedtime.  . Magnesium 400 MG TABS Take 400 mg by mouth daily.   . Melatonin 10 MG TABS Take 10 mg by mouth at bedtime.   . midodrine (PROAMATINE) 5 MG tablet Take 1 tablet (5 mg) in the morning after waking up and take 1 tablet (5 mg) in the afternoon after you nap  . Multiple Vitamin (MULTI-VITAMINS) TABS Take 1 tablet by mouth daily.   . Omega-3 Fatty Acids (FISH OIL) 1200 MG CAPS Take 1,200 mg by mouth daily.  Marland Kitchen oxymetazoline (AFRIN) 0.05 % nasal spray Place 2 sprays into both nostrils at bedtime as needed for congestion.  . pantoprazole (PROTONIX) 40 MG tablet TAKE 1 TABLET (40 MG TOTAL) BY MOUTH ONCE DAILY TAKE 30 MINS BEFORE MEAL  . Probiotic Product ([place]) CAPS Take by mouth.  . QUEtiapine Fumarate (SEROQUEL XR) 150 MG 24 hr tablet TAKE 1 TABLET (150 MG TOTAL) BY MOUTH AT BEDTIME.  . simvastatin (ZOCOR) 20 MG tablet Take 1 tablet (20 mg total) by mouth daily.  . solifenacin (VESICARE) 10 MG  tablet Take 1 tablet (10 mg total) by mouth daily.  Marland Kitchen triamcinolone cream (KENALOG) 0.1 % Apply 1 application topically 2 (two) times daily.  Marland Kitchen venlafaxine XR (EFFEXOR XR) 75 MG 24 hr capsule Take 3 capsules (225 mg total) by mouth daily.  . [DISCONTINUED] miconazole (MICONAZOLE ANTIFUNGAL) 2 % cream Apply 1 application topically 2 (two) times daily. (Patient not taking: Reported on 03/26/2019)   No facility-administered encounter medications on file as of 04/21/2019.    Activities of Daily Living In your present state of health, do you have any difficulty performing the following activities: 04/21/2019 10/18/2018  Hearing? N N  Comment no hearing aids, tinnitus -  Vision? N N  Comment reading glasses, Dr.Woodard -  Difficulty concentrating or making decisions? Y N  Walking or climbing stairs? N N  Comment takes time -  Dressing or bathing? N N  Doing errands, shopping? N N  Preparing Food and eating ? N -  Using the Toilet? N -  In the past six months, have you accidently leaked urine? N -  Do you have problems with loss of bowel control? N -  Managing your Medications? N -  Managing your Finances? N -  Housekeeping or managing your Housekeeping? N -  Some recent data might be hidden    Patient Care Team: Venita Lick, NP as PCP - General (Nurse Practitioner) [name], MD (Specialist) Hollice Espy, MD as Consulting Physician (Urology) Tanda Rockers, MD as Consulting Physician (Pulmonary Disease) [name], MD as Consulting Physician (Anesthesiology) Deboraha Sprang, MD as Consulting Physician (Cardiology) Greg Cutter, LCSW as Social Worker (Licensed Clinical Social Worker)   Assessment:   This is a routine wellness examination for [facility].  Exercise Activities and Dietary recommendations Current Exercise Habits: The patient does not participate in regular exercise at present(golf once a week), Exercise limited by: None identified  Goals Addressed   None       Fall Risk: Fall Risk  04/21/2019 04/18/2018 02/25/2018  01/03/2018 12/24/2017  Falls in the past year? 0 1 0 1 0  Number falls in past yr: 0 0 0 1 -  Comment - - - - -  Injury with Fall? 0 1 0 1 -  Comment - hit head, visited ED - - -  Risk Factor Category  - - - - -  Risk for fall due to : - - - History of fall(s) -  Follow up - - - Falls evaluation completed -  Comment - - - - -    FALL RISK PREVENTION PERTAINING TO THE HOME:  Any stairs in or around the home? Yes  If so, are there any without handrails? No   Home free of loose throw rugs in walkways, pet beds, electrical cords, etc? Yes  Adequate lighting in your home to reduce risk of falls? Yes   ASSISTIVE DEVICES UTILIZED TO PREVENT FALLS:  Life alert? No  Use of a cane, walker or w/c? Yes  cane when out for long walks  Grab bars in the bathroom? Yes  Shower chair or bench in shower? Yes  Elevated toilet seat or a handicapped toilet? Yes   TIMED UP AND GO:  Unable to perform   Depression Screen PHQ 2/9 Scores 04/21/2019 03/26/2019 08/14/2018 04/18/2018  PHQ - 2 Score 0 0 0 0  PHQ- 9 Score - 1 7 -  Exception Documentation - - - -    Cognitive Function     6CIT Screen 04/18/2018 04/11/2017  What Year? 0 points 0 points  What month? 0 points 0 points  What time? 0 points 0 points  Count back from 20 0 points 0 points  Months in reverse 0 points 0 points  Repeat phrase 0 points 0 points  Total Score 0 0    Immunization History  Administered Date(s) Administered  . Influenza Split 11/13/2013  . Influenza Whole 11/09/2015  . Influenza, High Dose Seasonal PF 11/16/2015, 11/14/2016, 09/27/2017, 09/28/2018  . Influenza,inj,Quad PF,6+ Mos 10/20/2014  . Influenza-Unspecified 09/23/2012, 03/21/2018  . Moderna SARS-COVID-2 Vaccination 03/07/2019, 04/07/2019  . Pneumococcal Conjugate-13 04/28/2014, 09/28/2018  . Pneumococcal Polysaccharide-23 04/10/2013  . Pneumococcal-Unspecified 10/01/2008  . Td 01/05/2005  . Tdap  06/11/2017    Qualifies for Shingles Vaccine? Yes  Zostavax completed n/a. Due for Shingrix. Education has been provided regarding the importance of this vaccine. Pt has been advised to call insurance company to determine out of pocket expense. Advised may also receive vaccine at local pharmacy or Health Dept. Verbalized acceptance and understanding.  Tdap: up to date   Flu Vaccine: up to date  Pneumococcal Vaccine: up to date   Covid-19 Vaccine: Completed vaccines  Screening Tests Health Maintenance  Topic Date Due  . COLONOSCOPY  12/06/2025  . TETANUS/TDAP  06/12/2027  . INFLUENZA VACCINE  Completed  . Hepatitis C Screening  Completed  . PNA vac Low Risk Adult  Completed   Cancer Screenings:  Colorectal Screening: no longer required   Lung Cancer Screening: (Low Dose CT Chest recommended if Age 42-80 years, 30 pack-year currently smoking OR have quit w/in 15years.) does not qualify.    Additional Screening:  Hepatitis C Screening: does qualify; Completed 2017  Vision Screening: Recommended annual ophthalmology exams for early detection of glaucoma and other disorders of the eye. Is the patient up to date with their annual eye exam?  Yes  Who is the provider or what is the name of the office in which the pt attends annual eye exams? Dr.Woodard  Dental Screening: Recommended annual dental exams for proper oral hygiene  Community Resource Referral:  CRR required this visit?  No        Plan:  I have personally reviewed and addressed the Medicare Annual Wellness questionnaire and have noted the following in the patient's chart:  A. Medical and social history B. Use of alcohol, tobacco or illicit drugs  C. Current medications and supplements D. Functional ability and status E.  Nutritional status F.  Physical activity G. Advance directives H. List of other physicians I.  Hospitalizations, surgeries, and ER visits in previous 12 months J.  [place]  such as hearing and vision if needed, cognitive and depression L. Referrals and appointments   In addition, I have reviewed and discussed with patient certain preventive protocols, quality metrics, and best practice recommendations. A written personalized care plan for preventive services as well as general preventive health recommendations were provided to patient.   Signed,   Bevelyn Ngo, LPN  [id number] Nurse Health Advisor   Nurse Notes: none

## 2019-04-21 NOTE — Patient Instructions (Signed)
[name] , Thank you for taking time to come for your Medicare Wellness Visit. I appreciate your ongoing commitment to your health goals. Please review the following plan we discussed and let me know if I can assist you in the future.   Screening recommendations/referrals: Colonoscopy: up to date, no longer required  Recommended yearly ophthalmology/optometry visit for glaucoma screening and checkup Recommended yearly dental visit for hygiene and checkup  Vaccinations: Influenza vaccine: up to date Pneumococcal vaccine: up to date  Tdap vaccine: up to date Shingles vaccine: shingrix eligible    Covid-19: completed   Advanced directives: Advance directive discussed with you today. I have provided a copy for you to complete at home and have notarized. Once this is complete please bring a copy in to our office so we can scan it into your chart.  Conditions/risks identified: Discussed VA information, will mail representative information to you.  Next appointment: Follow up in one year for your annual wellness visit   Preventive Care 65 Years and Older, Male Preventive care refers to lifestyle choices and visits with your health care provider that can promote health and wellness. What does preventive care include?  A yearly physical exam. This is also called an annual well check.  Dental exams once or twice a year.  Routine eye exams. Ask your health care provider how often you should have your eyes checked.  Personal lifestyle choices, including:  Daily care of your teeth and gums.  Regular physical activity.  Eating a healthy diet.  Avoiding tobacco and drug use.  Limiting alcohol use.  Practicing safe sex.  Taking low doses of aspirin every day.  Taking vitamin and mineral supplements as recommended by your health care provider. What happens during an annual well check? The services and screenings done by your health care provider during your annual well check will  depend on your age, overall health, lifestyle risk factors, and family history of disease. Counseling  Your health care provider may ask you questions about your:  Alcohol use.  Tobacco use.  Drug use.  Emotional well-being.  Home and relationship well-being.  Sexual activity.  Eating habits.  History of falls.  Memory and ability to understand (cognition).  Work and work Statistician. Screening  You may have the following tests or measurements:  Height, weight, and BMI.  Blood pressure.  Lipid and cholesterol levels. These may be checked every 5 years, or more frequently if you are over 61 years old.  Skin check.  Lung cancer screening. You may have this screening every year starting at age 74 if you have a 30-pack-year history of smoking and currently smoke or have quit within the past 15 years.  Fecal occult blood test (FOBT) of the stool. You may have this test every year starting at age 74.  Flexible sigmoidoscopy or colonoscopy. You may have a sigmoidoscopy every 5 years or a colonoscopy every 10 years starting at age 74.  Prostate cancer screening. Recommendations will vary depending on your family history and other risks.  Hepatitis C blood test.  Hepatitis B blood test.  Sexually transmitted disease (STD) testing.  Diabetes screening. This is done by checking your blood sugar (glucose) after you have not eaten for a while (fasting). You may have this done every 1-3 years.  Abdominal aortic aneurysm (AAA) screening. You may need this if you are a current or former smoker.  Osteoporosis. You may be screened starting at age 74 if you are at high risk. Talk  with your health care provider about your test results, treatment options, and if necessary, the need for more tests. Vaccines  Your health care provider may recommend certain vaccines, such as:  Influenza vaccine. This is recommended every year.  Tetanus, diphtheria, and acellular pertussis (Tdap,  Td) vaccine. You may need a Td booster every 10 years.  Zoster vaccine. You may need this after age 75.  Pneumococcal 13-valent conjugate (PCV13) vaccine. One dose is recommended after age 74.  Pneumococcal polysaccharide (PPSV23) vaccine. One dose is recommended after age 74. Talk to your health care provider about which screenings and vaccines you need and how often you need them. This information is not intended to replace advice given to you by your health care provider. Make sure you discuss any questions you have with your health care provider. Document Released: 02/05/2015 Document Revised: 09/29/2015 Document Reviewed: 11/10/2014 Elsevier Interactive Patient Education  2017 [place] Prevention in the Home Falls can cause injuries. They can happen to people of all ages. There are many things you can do to make your home safe and to help prevent falls. What can I do on the outside of my home?  Regularly fix the edges of walkways and driveways and fix any cracks.  Remove anything that might make you trip as you walk through a door, such as a raised step or threshold.  Trim any bushes or trees on the path to your home.  Use bright outdoor lighting.  Clear any walking paths of anything that might make someone trip, such as rocks or tools.  Regularly check to see if handrails are loose or broken. Make sure that both sides of any steps have handrails.  Any raised decks and porches should have guardrails on the edges.  Have any leaves, snow, or ice cleared regularly.  Use sand or salt on walking paths during winter.  Clean up any spills in your garage right away. This includes oil or grease spills. What can I do in the bathroom?  Use night lights.  Install grab bars by the toilet and in the tub and shower. Do not use towel bars as grab bars.  Use non-skid mats or decals in the tub or shower.  If you need to sit down in the shower, use a plastic, non-slip  stool.  Keep the floor dry. Clean up any water that spills on the floor as soon as it happens.  Remove soap buildup in the tub or shower regularly.  Attach bath mats securely with double-sided non-slip rug tape.  Do not have throw rugs and other things on the floor that can make you trip. What can I do in the bedroom?  Use night lights.  Make sure that you have a light by your bed that is easy to reach.  Do not use any sheets or blankets that are too big for your bed. They should not hang down onto the floor.  Have a firm chair that has side arms. You can use this for support while you get dressed.  Do not have throw rugs and other things on the floor that can make you trip. What can I do in the kitchen?  Clean up any spills right away.  Avoid walking on wet floors.  Keep items that you use a lot in easy-to-reach places.  If you need to reach something above you, use a strong step stool that has a grab bar.  Keep electrical cords out of the way.  Do  not use floor polish or wax that makes floors slippery. If you must use wax, use non-skid floor wax.  Do not have throw rugs and other things on the floor that can make you trip. What can I do with my stairs?  Do not leave any items on the stairs.  Make sure that there are handrails on both sides of the stairs and use them. Fix handrails that are broken or loose. Make sure that handrails are as long as the stairways.  Check any carpeting to make sure that it is firmly attached to the stairs. Fix any carpet that is loose or worn.  Avoid having throw rugs at the top or bottom of the stairs. If you do have throw rugs, attach them to the floor with carpet tape.  Make sure that you have a light switch at the top of the stairs and the bottom of the stairs. If you do not have them, ask someone to add them for you. What else can I do to help prevent falls?  Wear shoes that:  Do not have high heels.  Have rubber bottoms.  Are  comfortable and fit you well.  Are closed at the toe. Do not wear sandals.  If you use a stepladder:  Make sure that it is fully opened. Do not climb a closed stepladder.  Make sure that both sides of the stepladder are locked into place.  Ask someone to hold it for you, if possible.  Clearly mark and make sure that you can see:  Any grab bars or handrails.  First and last steps.  Where the edge of each step is.  Use tools that help you move around (mobility aids) if they are needed. These include:  Canes.  Walkers.  Scooters.  Crutches.  Turn on the lights when you go into a dark area. Replace any light bulbs as soon as they burn out.  Set up your furniture so you have a clear path. Avoid moving your furniture around.  If any of your floors are uneven, fix them.  If there are any pets around you, be aware of where they are.  Review your medicines with your doctor. Some medicines can make you feel dizzy. This can increase your chance of falling. Ask your doctor what other things that you can do to help prevent falls. This information is not intended to replace advice given to you by your health care provider. Make sure you discuss any questions you have with your health care provider. Document Released: 11/05/2008 Document Revised: 06/17/2015 Document Reviewed: 02/13/2014 Elsevier Interactive Patient Education  2017 [employer].

## 2019-04-23 ENCOUNTER — Telehealth: Payer: Self-pay

## 2019-04-23 NOTE — Telephone Encounter (Signed)
Mailed VA information to patient per conversation from [place].

## 2019-04-30 ENCOUNTER — Telehealth: Payer: Self-pay

## 2019-05-13 ENCOUNTER — Ambulatory Visit: Payer: Medicare Other | Attending: Anesthesiology | Admitting: Anesthesiology

## 2019-05-13 ENCOUNTER — Other Ambulatory Visit: Payer: Self-pay

## 2019-05-13 ENCOUNTER — Encounter: Payer: Self-pay | Admitting: Anesthesiology

## 2019-05-13 DIAGNOSIS — M5136 Other intervertebral disc degeneration, lumbar region: Secondary | ICD-10-CM | POA: Diagnosis not present

## 2019-05-13 DIAGNOSIS — M47816 Spondylosis without myelopathy or radiculopathy, lumbar region: Secondary | ICD-10-CM

## 2019-05-13 DIAGNOSIS — M79642 Pain in left hand: Secondary | ICD-10-CM

## 2019-05-13 DIAGNOSIS — M5431 Sciatica, right side: Secondary | ICD-10-CM

## 2019-05-13 DIAGNOSIS — M5432 Sciatica, left side: Secondary | ICD-10-CM

## 2019-05-13 DIAGNOSIS — M79641 Pain in right hand: Secondary | ICD-10-CM

## 2019-05-13 DIAGNOSIS — G894 Chronic pain syndrome: Secondary | ICD-10-CM | POA: Diagnosis not present

## 2019-05-13 DIAGNOSIS — F1190 Opioid use, unspecified, uncomplicated: Secondary | ICD-10-CM

## 2019-05-13 DIAGNOSIS — M5416 Radiculopathy, lumbar region: Secondary | ICD-10-CM

## 2019-05-13 MED ORDER — HYDROCODONE-ACETAMINOPHEN 5-325 MG PO TABS: 1.0000 | ORAL_TABLET | Freq: Two times a day (BID) | ORAL | 0 refills | Status: DC | PRN

## 2019-05-13 MED ORDER — METHOCARBAMOL 500 MG PO TABS: 500.0000 mg | ORAL_TABLET | Freq: Two times a day (BID) | ORAL | 2 refills | Status: DC | PRN

## 2019-05-13 MED ORDER — HYDROCODONE-ACETAMINOPHEN 5-325 MG PO TABS: 1.0000 | ORAL_TABLET | Freq: Two times a day (BID) | ORAL | 0 refills | Status: AC

## 2019-05-13 NOTE — Progress Notes (Signed)
Virtual Visit via Telephone Note  I connected with Nicholas [name] on 05/13/19 at 12:30 PM EDT by telephone and verified that I am speaking with the correct person using two identifiers.  Location: Patient: Home Provider: Pain control center   I discussed the limitations, risks, security and privacy concerns of performing an evaluation and management service by telephone and the availability of in person appointments. I also discussed with the patient that there may be a patient responsible charge related to this service. The patient expressed understanding and agreed to proceed.   History of Present Illness: I spoke with Nicholas [name] today regarding his low back.  He has been more active and playing more golf.  He feels like he is making good progress with the stretching as well however he recently walked into the living room and bent over to pick something up developing severe lumbar central low back pain.  He is not describing any type of radiculitis to the lower extremities and has experienced no change in lower extremity strength or bowel or bladder function.  The pain has been worse with certain movements.  He is not taking any muscle relaxants and has continued to take his hydrocodone effectively twice a day which has given him some relief.  Despite this he has tried to play some golf and has been limited but able to push through he reports.  Otherwise no change in the quality characteristic distribution of his lower extremity pain or low back pain other than the recent aggravation.    Observations/Objective:  Current Outpatient Medications:  .  acetaminophen (TYLENOL) 650 MG CR tablet, Take 650 mg by mouth daily. , Disp: , Rfl:  .  apixaban (ELIQUIS) 5 MG TABS tablet, Take 1 tablet (5 mg total) by mouth 2 (two) times daily. (Patient taking differently: Take by mouth 2 (two) times daily. ), Disp: 180 tablet, Rfl: 4 .  ascorbic acid (VITAMIN C) 250 MG tablet, Take 250 mg by mouth daily. , Disp:  , Rfl:  .  Cranberry 500 MG CAPS, Take 1 capsule by mouth daily. , Disp: , Rfl:  .  Cyanocobalamin (B-12) 1000 MCG CAPS, Take 1,000 mcg by mouth daily. , Disp: , Rfl:  .  ferrous sulfate 325 (65 FE) MG tablet, Take 325 mg by mouth daily with breakfast., Disp: , Rfl:  .  flecainide (TAMBOCOR) 100 MG tablet, Take 1 tablet (100 mg total) by mouth 2 (two) times daily., Disp: 180 tablet, Rfl: 3 .  gabapentin (NEURONTIN) 400 MG capsule, Take 1 capsule (400 mg total) by mouth daily., Disp: 90 capsule, Rfl: 1 .  [START ON 05/19/2019] HYDROcodone-acetaminophen (NORCO/VICODIN) 5-325 MG tablet, Take 1 tablet by mouth 2 (two) times daily., Disp: 60 tablet, Rfl: 0 .  [START ON 06/18/2019] HYDROcodone-acetaminophen (NORCO/VICODIN) 5-325 MG tablet, Take 1 tablet by mouth 2 (two) times daily as needed for moderate pain or severe pain., Disp: 60 tablet, Rfl: 0 .  hyoscyamine (LEVSIN, ANASPAZ) 0.125 MG tablet, Take 0.125 mg by mouth every 6 (six) hours as needed for bladder spasms or cramping. , Disp: , Rfl:  .  LORazepam (ATIVAN) 1 MG tablet, Take 1 tablet (1 mg total) by mouth at bedtime., Disp: 90 tablet, Rfl: 0 .  Magnesium 400 MG TABS, Take 400 mg by mouth daily. , Disp: , Rfl:  .  Melatonin 10 MG TABS, Take 10 mg by mouth at bedtime. , Disp: , Rfl:  .  methocarbamol (ROBAXIN) 500 MG tablet, Take 1 tablet (500  mg total) by mouth 2 (two) times daily as needed for muscle spasms., Disp: 60 tablet, Rfl: 2 .  midodrine (PROAMATINE) 5 MG tablet, Take 1 tablet (5 mg) in the morning after waking up and take 1 tablet (5 mg) in the afternoon after you nap, Disp: 60 tablet, Rfl: 6 .  Multiple Vitamin (MULTI-VITAMINS) TABS, Take 1 tablet by mouth daily. , Disp: , Rfl:  .  Omega-3 Fatty Acids (FISH OIL) 1200 MG CAPS, Take 1,200 mg by mouth daily., Disp: , Rfl:  .  oxymetazoline (AFRIN) 0.05 % nasal spray, Place 2 sprays into both nostrils at bedtime as needed for congestion., Disp: , Rfl:  .  pantoprazole (PROTONIX) 40 MG  tablet, TAKE 1 TABLET (40 MG TOTAL) BY MOUTH ONCE DAILY TAKE 30 MINS BEFORE MEAL, Disp: , Rfl: 6 .  Probiotic Product ([place]) CAPS, Take by mouth., Disp: , Rfl:  .  QUEtiapine Fumarate (SEROQUEL XR) 150 MG 24 hr tablet, TAKE 1 TABLET (150 MG TOTAL) BY MOUTH AT BEDTIME., Disp: 90 tablet, Rfl: 4 .  simvastatin (ZOCOR) 20 MG tablet, Take 1 tablet (20 mg total) by mouth daily., Disp: 90 tablet, Rfl: 4 .  solifenacin (VESICARE) 10 MG tablet, Take 1 tablet (10 mg total) by mouth daily., Disp: 90 tablet, Rfl: 4 .  triamcinolone cream (KENALOG) 0.1 %, Apply 1 application topically 2 (two) times daily., Disp: , Rfl:  .  venlafaxine XR (EFFEXOR XR) 75 MG 24 hr capsule, Take 3 capsules (225 mg total) by mouth daily., Disp: 270 capsule, Rfl: 4  Assessment and Plan: 1. Lumbar radiculopathy   2. Degeneration of lumbar intervertebral disc   3. Chronic, continuous use of opioids   4. Chronic pain syndrome   5. Bilateral hand pain   6. Facet syndrome, lumbar   7. Bilateral sciatica   Based on our discussion today I am going to continue on his current medications for April 17 Jun 2024 refills on his hydrocodone for twice a day dosing.  I have reviewed the [facility] practitioner database information and it is appropriate.  In the meantime I have talked about a some of the physical therapy exercises that might be of benefit for a possible quadratus lumborum or psoas strain.  I also want him to continue walking and golf as tolerated.  He is to continue with efforts at weight loss and stretching in general and continue follow-up with his primary care physicians for his basic medical care.  I am scheduling him for 12-month return to clinic.  He is to contact us should he have any problems in the meantime and I will start him on Robaxin 500 g twice a day to help with some muscle relaxation as discussed with him in detail today.  Follow Up Instructions:    I discussed the assessment and treatment  plan with the patient. The patient was provided an opportunity to ask questions and all were answered. The patient agreed with the plan and demonstrated an understanding of the instructions.   The patient was advised to call back or seek an in-person evaluation if the symptoms worsen or if the condition fails to improve as anticipated.  I provided 30 minutes of non-face-to-face time during this encounter.   [name], MD

## 2019-05-19 ENCOUNTER — Ambulatory Visit: Payer: Self-pay

## 2019-05-19 NOTE — Chronic Care Management (AMB) (Signed)
  Care Management   Follow Up Note   05/19/2019 Name: KODA QUIER MRN: [id number] DOB: [birth year]  Referred by: Venita Lick, NP Reason for referral : Care Coordination   MODOU MEGNA is a 74 y.o. year old male who is a primary care patient of Cannady, Barbaraann Faster, NP. The care management team was consulted for assistance with care management and care coordination needs.    Review of patient status, including review of consultants reports, relevant laboratory and other test results, and collaboration with appropriate care team members and the patient's provider was performed as part of comprehensive patient evaluation and provision of chronic care management services.    LCSW completed CCM outreach attempt today but was unable to reach patient successfully. A HIPPA compliant voice message was left encouraging patient to return call once available. LCSW rescheduled CCM SW appointment as well.  A HIPPA compliant phone message was left for the patient providing contact information and requesting a return call.   Eula Fried, BSW, MSW, [facility] [email or web address] Phone: [phone]

## 2019-06-10 ENCOUNTER — Telehealth: Payer: Self-pay | Admitting: Internal Medicine

## 2019-06-10 NOTE — Telephone Encounter (Signed)
LMOM to call office and device clinic # provided.

## 2019-06-10 NOTE — Telephone Encounter (Signed)
Patient wife is calling and states patient needs to reschedule his remote check. Please call to discuss.

## 2019-06-11 NOTE — Telephone Encounter (Signed)
Called to assist with scheduling, no answer, LMOVM.

## 2019-06-12 ENCOUNTER — Telehealth: Payer: Self-pay

## 2019-06-12 NOTE — Telephone Encounter (Signed)
  Called [place] (D[facility]), and grandson answered. He states patient is at the beach and will not be back until 06/16/19 or 06/17/19. No other information was discussed. Thanked him for his time.

## 2019-06-12 NOTE — Telephone Encounter (Signed)
Left message for patient to remind of missed remote transmission.  

## 2019-06-16 ENCOUNTER — Ambulatory Visit (INDEPENDENT_AMBULATORY_CARE_PROVIDER_SITE_OTHER): Payer: Medicare Other | Admitting: *Deleted

## 2019-06-16 DIAGNOSIS — I442 Atrioventricular block, complete: Secondary | ICD-10-CM | POA: Diagnosis not present

## 2019-06-16 LAB — CUP PACEART REMOTE DEVICE CHECK
Battery Remaining Longevity: 55 mo
Battery Voltage: 2.99 V
Brady Statistic AP VP Percent: 34.58 %
Brady Statistic AP VS Percent: 0 %
Brady Statistic AS VP Percent: 65.38 %
Brady Statistic AS VS Percent: 0.03 %
Brady Statistic RA Percent Paced: 34.57 %
Brady Statistic RV Percent Paced: 99.93 %
Date Time Interrogation Session: 20210524160124
Implantable Lead Implant Date: 20180124
Implantable Lead Implant Date: 20180124
Implantable Lead Location: 753859
Implantable Lead Location: 753860
Implantable Lead Model: 3830
Implantable Lead Model: 5076
Implantable Pulse Generator Implant Date: 20180124
Lead Channel Impedance Value: 342 Ohm
Lead Channel Impedance Value: 361 Ohm
Lead Channel Impedance Value: 456 Ohm
Lead Channel Impedance Value: 513 Ohm
Lead Channel Pacing Threshold Amplitude: 0.625 V
Lead Channel Pacing Threshold Amplitude: 0.75 V
Lead Channel Pacing Threshold Pulse Width: 0.4 ms
Lead Channel Pacing Threshold Pulse Width: 0.4 ms
Lead Channel Sensing Intrinsic Amplitude: 1.375 mV
Lead Channel Sensing Intrinsic Amplitude: 1.375 mV
Lead Channel Sensing Intrinsic Amplitude: 4.375 mV
Lead Channel Sensing Intrinsic Amplitude: 4.375 mV
Lead Channel Setting Pacing Amplitude: 2 V
Lead Channel Setting Pacing Amplitude: 2.25 V
Lead Channel Setting Pacing Pulse Width: 1 ms
Lead Channel Setting Sensing Sensitivity: 0.9 mV

## 2019-06-17 NOTE — Progress Notes (Signed)
Remote pacemaker transmission.   

## 2019-06-17 NOTE — Telephone Encounter (Signed)
Transmission received 06/16/19

## 2019-06-30 ENCOUNTER — Ambulatory Visit: Payer: Medicare Other | Admitting: Nurse Practitioner

## 2019-07-01 ENCOUNTER — Other Ambulatory Visit: Payer: Self-pay

## 2019-07-01 ENCOUNTER — Ambulatory Visit (INDEPENDENT_AMBULATORY_CARE_PROVIDER_SITE_OTHER): Payer: Medicare Other | Admitting: Nurse Practitioner

## 2019-07-01 ENCOUNTER — Encounter: Payer: Self-pay | Admitting: Nurse Practitioner

## 2019-07-01 VITALS — BP 128/78 | HR 76 | Temp 98.5°F | Wt 273.4 lb

## 2019-07-01 DIAGNOSIS — F339 Major depressive disorder, recurrent, unspecified: Secondary | ICD-10-CM | POA: Diagnosis not present

## 2019-07-01 DIAGNOSIS — I442 Atrioventricular block, complete: Secondary | ICD-10-CM

## 2019-07-01 DIAGNOSIS — E785 Hyperlipidemia, unspecified: Secondary | ICD-10-CM

## 2019-07-01 DIAGNOSIS — I10 Essential (primary) hypertension: Secondary | ICD-10-CM

## 2019-07-01 DIAGNOSIS — F419 Anxiety disorder, unspecified: Secondary | ICD-10-CM

## 2019-07-01 DIAGNOSIS — N1831 Chronic kidney disease, stage 3a: Secondary | ICD-10-CM

## 2019-07-01 DIAGNOSIS — I951 Orthostatic hypotension: Secondary | ICD-10-CM

## 2019-07-01 DIAGNOSIS — R7301 Impaired fasting glucose: Secondary | ICD-10-CM

## 2019-07-01 DIAGNOSIS — Z79899 Other long term (current) drug therapy: Secondary | ICD-10-CM

## 2019-07-01 MED ORDER — LORAZEPAM 1 MG PO TABS: 1.0000 mg | ORAL_TABLET | Freq: Every day | ORAL | 0 refills | Status: DC

## 2019-07-01 NOTE — Assessment & Plan Note (Signed)
Has been on Ativan for many years.  At length discussions about risk of opioid and benzo use had with patient and wife by both PCP and CCM PharmD.  Continue these discussion.  Refer to anxiety plan. 

## 2019-07-01 NOTE — Assessment & Plan Note (Signed)
Followed by cardiology.  Pacemaker checks by them.

## 2019-07-01 NOTE — Assessment & Plan Note (Signed)
Recent A1C 6.3% and today 6.1%, has been working on diet and lost some weight.  Continue diet focus and regular exercise.  Recheck next visit, educated at length on A1C and diabetes.

## 2019-07-01 NOTE — Assessment & Plan Note (Addendum)
Chronic, ongoing.  Followed by cardiology.  BP at goal today in office on repeat check.  With orthostatic BP presenting occasionally will continue current regimen at this time and have recommended utilizing compression hose at home, refuses this.  Continue collaboration with cardiology and current medication regimen as prescribed by them.  BMP today.

## 2019-07-01 NOTE — Assessment & Plan Note (Addendum)
BMI 38.13.  Recommended eating smaller high protein, low fat meals more frequently and exercising 30 mins a day 5 times a week with a goal of 10-15lb weight loss in the next 3 months. Patient voiced their understanding and motivation to adhere to these recommendations.

## 2019-07-01 NOTE — Progress Notes (Signed)
BP 128/78 (BP Location: Left Arm)   Pulse 76   Temp 98.5 F (36.9 C) (Oral)   Wt 273 lb 6.4 oz (124 kg)   SpO2 93%   BMI 38.13 kg/m    Subjective:    Patient ID: Nicholas [name], male    DOB: 23-Apr-1945, 74 y.o.   MRN: [id number]  HPI: Nicholas [name] is a 74 y.o. male  Chief Complaint  Patient presents with  . Depression  . Diabetes  . Hyperlipidemia  . Hypertension   Wife present at bedside with patient.  HYPERTENSION / HYPERLIPIDEMIA Followed by Dr. Caryl Comes and last seen yesterday, was started on ProAmatine due to dizziness and occasional dizziness with orthostatic BPs. 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 Does endorse having occasional episodes over past year or so of dizziness when changing positions from sitting to standing. Does not wear compression hose, refuses to wear these. Continues on Simvastatin, Eliquis, fish oil. Satisfied with current treatment?yes Duration of hypertension:chronic BP monitoring frequency:daily BP range:120 - 130/70-80 with occasional lower with standing BP medication side effects:no Duration of hyperlipidemia:chronic Cholesterol medication side effects:no Cholesterol supplements: fish oil Medication compliance:good compliance Aspirin:no Recent stressors:no Recurrent headaches:no Visual changes:no Palpitations:no Dyspnea:no Chest pain:no Lower extremity edema:no Dizzy/lightheaded:no  IFG: Noted on review past labs elevation in glucose ranging from 120 to 160 range since 2019 with A1C in March 6.3%. Discussed with patient as recent cardiology labs noted some CKD present.   Polydipsia/polyuria: no Visual disturbance: no Chest pain: no Paresthesias: no   CHRONIC KIDNEY DISEASE Noted on cardiology labs from yesterday with GFR 47 and CRT 1.46, BUN 21.  Discussed with patient and his wife. CKD status: stable Medications renally dose: yes Previous renal evaluation: no Pneumovax:  Up to  Date Influenza Vaccine:  Up to Date  DEPRESSION Is on Effexor, Seroquel, and Ativan. Pt and his wife at bedside [facility] of risks of benzomedication use to include increased sedation, respiratory suppression, falls, extrapyramidal movements, dependence and cardiovascular events. Pt and his wifewould like to continue treatment as benefit determined to outweigh risk.Discussed with him that he is also on opioid therapy, he takes Ativan at night when he also takes his Norco and his Seroquel.Discussed risks with taking these three medications together at same time. He has tried taking 1/2 tablet Ativan, but this does not work well.  Last Ativan fill on PMP review 04/14/19 and last opioid fill 06/19/19. Duration:stable Anxious mood:yes Excessive worrying:no Irritability:no Sweating:no Nausea:no Palpitations:no Hyperventilation:no Panic attacks:no Agoraphobia:no Obscessions/compulsions:no Depressed mood:no Depression screen [facility] 2/9 07/01/2019 04/21/2019 03/26/2019 08/14/2018 04/18/2018  Decreased Interest 0 0 0 0 0  Down, Depressed, Hopeless 0 0 0 0 0  PHQ - 2 Score 0 0 0 0 0  Altered sleeping 0 - 0 1 -  Tired, decreased energy 1 - 1 3 -  Change in appetite 0 - 0 1 -  Feeling bad or failure about yourself  0 - 0 2 -  Trouble concentrating 0 - 0 0 -  Moving slowly or fidgety/restless 0 - 0 0 -  Suicidal thoughts 0 - 0 0 -  PHQ-9 Score 1 - 1 7 -  Difficult doing work/chores Not difficult at all - Not difficult at all Not difficult at all -  Some recent data might be hidden   GAD 7 : Generalized Anxiety Score 08/14/2018 07/02/2017  Nervous, Anxious, on Edge 0 1  Control/stop worrying 1 1  Worry too much - different things 2 2  Trouble relaxing 0 1  Restless 0 0  Easily annoyed or irritable 0 1  Afraid - awful might happen 1 2  Total GAD 7 Score 4 8  Anxiety Difficulty Not difficult at all Somewhat difficult    Relevant past medical, surgical, family and social history  reviewed and updated as indicated. Interim medical history since our last visit reviewed. Allergies and medications reviewed and updated.  Review of Systems  Constitutional: Negative for activity change, appetite change, fatigue and fever.  Respiratory: Negative for cough, chest tightness, shortness of breath and wheezing.   Cardiovascular: Negative for chest pain, palpitations and leg swelling.  Endocrine: Negative for polydipsia, polyphagia and polyuria.  Musculoskeletal: Negative.   Neurological: Negative.   Psychiatric/Behavioral: Positive for decreased concentration and sleep disturbance. Negative for self-injury and suicidal ideas. The patient is not nervous/anxious.     Per HPI unless specifically indicated above     Objective:    BP 128/78 (BP Location: Left Arm)   Pulse 76   Temp 98.5 F (36.9 C) (Oral)   Wt 273 lb 6.4 oz (124 kg)   SpO2 93%   BMI 38.13 kg/m   Wt Readings from Last 3 Encounters:  07/01/19 273 lb 6.4 oz (124 kg)  04/21/19 282 lb (127.9 kg)  03/25/19 281 lb (127.5 kg)    Physical Exam Vitals and nursing note reviewed.  Constitutional:      General: He is awake. He is not in acute distress.    Appearance: He is well-developed and well-groomed. He is obese.  HENT:     Head: Normocephalic and atraumatic.     Right Ear: Hearing normal. No drainage.     Left Ear: Hearing normal. No drainage.  Eyes:     General: Lids are normal.        Right eye: No discharge.        Left eye: No discharge.     Conjunctiva/sclera: Conjunctivae normal.     Pupils: Pupils are equal, round, and reactive to light.  Neck:     Thyroid: No thyromegaly.     Vascular: No carotid bruit.  Cardiovascular:     Rate and Rhythm: Normal rate and regular rhythm.     Heart sounds: Normal heart sounds, S1 normal and S2 normal. No murmur. No gallop.   Pulmonary:     Effort: Pulmonary effort is normal. No accessory muscle usage or respiratory distress.     Breath sounds: Normal  breath sounds.  Abdominal:     General: Bowel sounds are normal.     Palpations: Abdomen is soft.  Musculoskeletal:        General: Normal range of motion.     Cervical back: Normal range of motion and neck supple.     Right lower leg: No edema.     Left lower leg: No edema.  Skin:    General: Skin is warm and dry.  Neurological:     Mental Status: He is alert and oriented to person, place, and time.  Psychiatric:        Attention and Perception: Attention normal.        Mood and Affect: Mood normal.        Behavior: Behavior normal. Behavior is cooperative.        Thought Content: Thought content normal.     Results for orders placed or performed in visit on 06/16/19  CUP PACEART REMOTE DEVICE CHECK  Result Value Ref Range   Date Time Interrogation Session  [id number]    Pulse Generator Manufacturer MERM    Pulse Gen Model [id number] Advisa DR MRI    Pulse Gen Serial Number [id number]    Clinic Name [facility]    Implantable Pulse Generator Type Implantable Pulse Generator    Implantable Pulse Generator Implant Date [id number]    Implantable Lead Manufacturer MERM    Implantable Lead Model 3830 SelectSecure MRI SureScan    Implantable Lead Serial Number [id number]    Implantable Lead Implant Date 61443154    Implantable Lead Location Detail 1 UNKNOWN    Implantable Lead Special Function Bundle of His    Implantable Lead Location [id number]    Implantable Lead Manufacturer MERM    Implantable Lead Model 5076 CapSureFix Novus MRI SureScan    Implantable Lead Serial Number [id number]    Implantable Lead Implant Date [id number]    Implantable Lead Location Detail 1 APPENDAGE    Implantable Lead Location [phone]    Lead Channel Setting Sensing Sensitivity 0.9 mV   Lead Channel Setting Pacing Amplitude 2 V   Lead Channel Setting Pacing Pulse Width 1 ms   Lead Channel Setting Pacing Amplitude 2.25 V   Lead Channel Impedance Value 456 ohm   Lead Channel Impedance Value 361 ohm    Lead Channel Sensing Intrinsic Amplitude 4.375 mV   Lead Channel Sensing Intrinsic Amplitude 4.375 mV   Lead Channel Pacing Threshold Amplitude 0.625 V   Lead Channel Pacing Threshold Pulse Width 0.4 ms   Lead Channel Impedance Value 513 ohm   Lead Channel Impedance Value 342 ohm   Lead Channel Sensing Intrinsic Amplitude 1.375 mV   Lead Channel Sensing Intrinsic Amplitude 1.375 mV   Lead Channel Pacing Threshold Amplitude 0.75 V   Lead Channel Pacing Threshold Pulse Width 0.4 ms   Battery Status OK    Battery Remaining Longevity 55 mo   Battery Voltage 2.99 V   Brady Statistic RA Percent Paced 34.57 %   Brady Statistic RV Percent Paced 99.93 %   Brady Statistic AP VP Percent 34.58 %   Brady Statistic AS VP Percent 65.38 %   Brady Statistic AP VS Percent 0 %   Brady Statistic AS VS Percent 0.03 %      Assessment & Plan:   Problem List Items Addressed This Visit      Cardiovascular and Mediastinum   HTN (hypertension) (Chronic)    Chronic, ongoing.  Followed by cardiology.  BP at goal today in office on repeat check.  With orthostatic BP presenting occasionally will continue current regimen at this time and have recommended utilizing compression hose at home, refuses this.  Continue collaboration with cardiology and current medication regimen as prescribed by them.  BMP today.      Relevant Orders   Basic metabolic panel   Chronic orthostatic hypotension    Continue collaboration with cardiology and medication regimen as prescribed by them.  Recommend ensuring good hydration throughout daytime hours + use of compression hose at home (on during daytime and off during night), refuses compression hose.      Complete heart block ([place])    Followed by cardiology.  Pacemaker checks by them.         Endocrine   IFG (impaired fasting glucose)    Recent A1C 6.3% and today 6.1%, has been working on diet and lost some weight.  Continue diet focus and regular exercise.  Recheck next  visit, educated at length on A1C and diabetes.      Relevant  Orders   Bayer DCA Hb A1c Waived     Genitourinary   Stage 3a chronic kidney disease    Noted on recent cardiology labs.  Monitor medications and renal dose where possible.  If worsening labs in future will refer to nephrology.  Educated wife and patient on CKD.        Other   Dyslipidemia (Chronic)    Chronic, ongoing.  Continue current medication regimen and adjust as needed. Lipid panel today.      Relevant Orders   Lipid Panel w/o Chol/HDL Ratio   Depression, recurrent (HCC) - Primary    Chronic, ongoing.  Denies SI/HI.  Continue current medication regimen and adjust as needed.  Would benefit from trial off Effexor and trial of SSRI, but refuses this.        Relevant Medications   LORazepam (ATIVAN) 1 MG tablet   Anxiety    Chronic, ongoing.  Discussed at length risk of benzo and opioid use in conjunction with each other.  Recommend he not take the two together at same hour during day or evening.  He tried to cut back to 1/2 tablet but was unsuccessful.   Continue to collaborate with CCM team on education.  Provided wife and him with copy of [facility] ben[facility] risk information patient sheet last visit.  They request 90 day supply, as previous PCP supplied this.  Are aware he does have to return every 3 months for refills.  Obtain UDS and contract next visit.  Refills sent.      Relevant Medications   LORazepam (ATIVAN) 1 MG tablet   Long-term current use of benzodiazepine    Has been on Ativan for many years.  At length discussions about risk of opioid and benzo use had with patient and wife by both PCP and CCM PharmD.  Continue these discussion.  Refer to anxiety plan.      Morbid obesity (HCC)    BMI 38.13.  Recommended eating smaller high protein, low fat meals more frequently and exercising 30 mins a day 5 times a week with a goal of 10-15lb weight loss in the next 3 months. Patient voiced their understanding and  motivation to adhere to these recommendations.           Follow up plan: Return in about 4 months (around 10/31/2019) for Annual physical.

## 2019-07-01 NOTE — Assessment & Plan Note (Signed)
Chronic, ongoing.  Discussed at length risk of benzo and opioid use in conjunction with each other.  Recommend he not take the two together at same hour during day or evening.  He tried to cut back to 1/2 tablet but was unsuccessful.   Continue to collaborate with CCM team on education.  Provided wife and him with copy of [facility] ben[facility] risk information patient sheet last visit.  They request 90 day supply, as previous PCP supplied this.  Are aware he does have to return every 3 months for refills.  Obtain UDS and contract next visit.  Refills sent. 

## 2019-07-01 NOTE — Assessment & Plan Note (Signed)
Noted on recent cardiology labs.  Monitor medications and renal dose where possible.  If worsening labs in future will refer to nephrology.  Educated wife and patient on CKD.

## 2019-07-01 NOTE — Patient Instructions (Signed)

## 2019-07-01 NOTE — Assessment & Plan Note (Signed)
Continue collaboration with cardiology and medication regimen as prescribed by them.  Recommend ensuring good hydration throughout daytime hours + use of compression hose at home (on during daytime and off during night), refuses compression hose.

## 2019-07-01 NOTE — Assessment & Plan Note (Signed)
Chronic, ongoing.  Denies SI/HI.  Continue current medication regimen and adjust as needed.  Would benefit from trial off Effexor and trial of SSRI, but refuses this.   

## 2019-07-01 NOTE — Assessment & Plan Note (Signed)
Chronic, ongoing.  Continue current medication regimen and adjust as needed. Lipid panel today. 

## 2019-07-02 LAB — LIPID PANEL W/O CHOL/HDL RATIO
Cholesterol, Total: 160 mg/dL (ref 100–199)
HDL: 53 mg/dL (ref >39)
LDL Chol Calc (NIH): 86 mg/dL (ref 0–99)
Triglycerides: 116 mg/dL (ref 0–149)
VLDL Cholesterol Cal: 21 mg/dL (ref 5–40)

## 2019-07-02 LAB — BASIC METABOLIC PANEL
BUN/Creatinine Ratio: 19 (ref 10–24)
BUN: 22 mg/dL (ref 8–27)
CO2: 22 mmol/L (ref 20–29)
Calcium: 9.1 mg/dL (ref 8.6–10.2)
Chloride: 104 mmol/L (ref 96–106)
Creatinine, Ser: 1.17 mg/dL (ref 0.76–1.27)
GFR calc Af Amer: 71 mL/min/{1.73_m2} (ref >59)
GFR calc non Af Amer: 61 mL/min/{1.73_m2} (ref >59)
Glucose: 116 mg/dL — ABNORMAL HIGH (ref 65–99)
Potassium: 4.6 mmol/L (ref 3.5–5.2)
Sodium: 143 mmol/L (ref 134–144)

## 2019-07-02 LAB — BAYER DCA HB A1C WAIVED: HB A1C (BAYER DCA - WAIVED): 6.1 % (ref <7.0)

## 2019-07-02 NOTE — Progress Notes (Signed)
Contacted via [place] morning Nicholas [name].  Your labs have returned and overall kidney function has improved number wise with GFR now 61 and creatinine 1.17.  Cholesterol levels continue to look good.  No further changes at this time.  Continue to focus on diet and exercise.  Have a great day!! Keep being awesome!! Kindest regards, Nicholas [name]

## 2019-07-16 ENCOUNTER — Ambulatory Visit: Payer: Medicare Other | Attending: Anesthesiology | Admitting: Anesthesiology

## 2019-07-16 ENCOUNTER — Telehealth: Payer: Self-pay | Admitting: *Deleted

## 2019-07-16 ENCOUNTER — Other Ambulatory Visit: Payer: Self-pay

## 2019-07-16 ENCOUNTER — Encounter: Payer: Self-pay | Admitting: Anesthesiology

## 2019-07-16 DIAGNOSIS — M5136 Other intervertebral disc degeneration, lumbar region: Secondary | ICD-10-CM

## 2019-07-16 DIAGNOSIS — G894 Chronic pain syndrome: Secondary | ICD-10-CM

## 2019-07-16 DIAGNOSIS — M5431 Sciatica, right side: Secondary | ICD-10-CM

## 2019-07-16 DIAGNOSIS — M5432 Sciatica, left side: Secondary | ICD-10-CM

## 2019-07-16 DIAGNOSIS — M79642 Pain in left hand: Secondary | ICD-10-CM

## 2019-07-16 DIAGNOSIS — F1190 Opioid use, unspecified, uncomplicated: Secondary | ICD-10-CM | POA: Diagnosis not present

## 2019-07-16 DIAGNOSIS — M5416 Radiculopathy, lumbar region: Secondary | ICD-10-CM

## 2019-07-16 DIAGNOSIS — M79641 Pain in right hand: Secondary | ICD-10-CM

## 2019-07-16 DIAGNOSIS — M47816 Spondylosis without myelopathy or radiculopathy, lumbar region: Secondary | ICD-10-CM

## 2019-07-16 DIAGNOSIS — M1611 Unilateral primary osteoarthritis, right hip: Secondary | ICD-10-CM

## 2019-07-16 MED ORDER — HYDROCODONE-ACETAMINOPHEN 5-325 MG PO TABS: 1.0000 | ORAL_TABLET | Freq: Four times a day (QID) | ORAL | 0 refills | Status: DC | PRN

## 2019-07-16 MED ORDER — APIXABAN 5 MG PO TABS: 5.0000 mg | ORAL_TABLET | Freq: Two times a day (BID) | ORAL | 4 refills | Status: DC

## 2019-07-16 MED ORDER — HYDROCODONE-ACETAMINOPHEN 5-325 MG PO TABS: 1.0000 | ORAL_TABLET | Freq: Two times a day (BID) | ORAL | 0 refills | Status: AC | PRN

## 2019-07-22 NOTE — Progress Notes (Signed)
Virtual Visit via Telephone Note  I connected with Nicholas [name] on 07/22/19 at  9:00 AM EDT by telephone and verified that I am speaking with the correct person using two identifiers.  Location: Patient: Home Provider: Pain control center   I discussed the limitations, risks, security and privacy concerns of performing an evaluation and management service by telephone and the availability of in person appointments. I also discussed with the patient that there may be a patient responsible charge related to this service. The patient expressed understanding and agreed to proceed.   History of Present Illness: I spoke with Nicholas [name] via telephone as he was unable to do the video portion of the conference.  This broke up but was briefly available.  He reports that he is been doing well in regards to his low back pain and has been back to playing golf more regularly.  His strength is good.  No significant changes noted.  He is ambulating well and has been doing his core stretching strengthening exercises provided.  He is furthermore taking his medications and getting good relief with the opioids.  No side effects reported.  He generally averages twice daily dosing with his hydrocodone and continues to get good relief.    Observations/Objective:  Current Outpatient Medications:  .  acetaminophen (TYLENOL) 650 MG CR tablet, Take 650 mg by mouth daily. , Disp: , Rfl:  .  apixaban (ELIQUIS) 5 MG TABS tablet, Take 1 tablet (5 mg total) by mouth 2 (two) times daily., Disp: 180 tablet, Rfl: 4 .  ascorbic acid (VITAMIN C) 250 MG tablet, Take 250 mg by mouth daily. , Disp: , Rfl:  .  Cranberry 500 MG CAPS, Take 1 capsule by mouth daily. , Disp: , Rfl:  .  Cyanocobalamin (B-12) 1000 MCG CAPS, Take 1,000 mcg by mouth daily. , Disp: , Rfl:  .  ferrous sulfate 325 (65 FE) MG tablet, Take 325 mg by mouth daily with breakfast., Disp: , Rfl:  .  flecainide (TAMBOCOR) 100 MG tablet, Take 1 tablet (100 mg total)  by mouth 2 (two) times daily., Disp: 180 tablet, Rfl: 3 .  gabapentin (NEURONTIN) 400 MG capsule, Take 1 capsule (400 mg total) by mouth daily., Disp: 90 capsule, Rfl: 1 .  HYDROcodone-acetaminophen (NORCO/VICODIN) 5-325 MG tablet, Take 1 tablet by mouth 2 (two) times daily as needed for moderate pain or severe pain., Disp: 60 tablet, Rfl: 0 .  [START ON 08/18/2019] HYDROcodone-acetaminophen (NORCO/VICODIN) 5-325 MG tablet, Take 1 tablet by mouth every 6 (six) hours as needed for moderate pain or severe pain., Disp: 60 tablet, Rfl: 0 .  hyoscyamine (LEVSIN, ANASPAZ) 0.125 MG tablet, Take 0.125 mg by mouth every 6 (six) hours as needed for bladder spasms or cramping. , Disp: , Rfl:  .  LORazepam (ATIVAN) 1 MG tablet, Take 1 tablet (1 mg total) by mouth at bedtime., Disp: 90 tablet, Rfl: 0 .  Magnesium 400 MG TABS, Take 400 mg by mouth daily. , Disp: , Rfl:  .  Melatonin 10 MG TABS, Take 10 mg by mouth at bedtime. , Disp: , Rfl:  .  methocarbamol (ROBAXIN) 500 MG tablet, Take 1 tablet (500 mg total) by mouth 2 (two) times daily as needed for muscle spasms., Disp: 60 tablet, Rfl: 2 .  midodrine (PROAMATINE) 5 MG tablet, Take 1 tablet (5 mg) in the morning after waking up and take 1 tablet (5 mg) in the afternoon after you nap, Disp: 60 tablet, Rfl: 6 .  Multiple Vitamin (MULTI-VITAMINS) TABS, Take 1 tablet by mouth daily. , Disp: , Rfl:  .  Omega-3 Fatty Acids (FISH OIL) 1200 MG CAPS, Take 1,200 mg by mouth daily., Disp: , Rfl:  .  oxymetazoline (AFRIN) 0.05 % nasal spray, Place 2 sprays into both nostrils at bedtime as needed for congestion., Disp: , Rfl:  .  pantoprazole (PROTONIX) 40 MG tablet, TAKE 1 TABLET (40 MG TOTAL) BY MOUTH ONCE DAILY TAKE 30 MINS BEFORE MEAL, Disp: , Rfl: 6 .  Probiotic Product ([place]) CAPS, Take by mouth., Disp: , Rfl:  .  QUEtiapine Fumarate (SEROQUEL XR) 150 MG 24 hr tablet, TAKE 1 TABLET (150 MG TOTAL) BY MOUTH AT BEDTIME., Disp: 90 tablet, Rfl: 4 .   simvastatin (ZOCOR) 20 MG tablet, Take 1 tablet (20 mg total) by mouth daily., Disp: 90 tablet, Rfl: 4 .  solifenacin (VESICARE) 10 MG tablet, Take 1 tablet (10 mg total) by mouth daily., Disp: 90 tablet, Rfl: 4 .  triamcinolone cream (KENALOG) 0.1 %, Apply 1 application topically 2 (two) times daily., Disp: , Rfl:  .  venlafaxine XR (EFFEXOR XR) 75 MG 24 hr capsule, Take 3 capsules (225 mg total) by mouth daily., Disp: 270 capsule, Rfl: 4  Assessment and Plan:  1. Lumbar radiculopathy   2. Degeneration of lumbar intervertebral disc   3. Chronic, continuous use of opioids   4. Chronic pain syndrome   5. Bilateral hand pain   6. Facet syndrome, lumbar   7. Bilateral sciatica   8. Osteoarthritis of right hip, unspecified osteoarthritis type   Based on the above findings and our discussion as well as review of the [facility] practitioner database information going to refill his medications for the next 2 months scheduled for June 26 and July 26.  We will schedule him for return to clinic in 2 months.  I want him to continue follow-up with his primary care physicians for his baseline medical care and continue playing golf as per routine with return to clinic as mentioned. Follow Up Instructions:    I discussed the assessment and treatment plan with the patient. The patient was provided an opportunity to ask questions and all were answered. The patient agreed with the plan and demonstrated an understanding of the instructions.   The patient was advised to call back or seek an in-person evaluation if the symptoms worsen or if the condition fails to improve as anticipated.  I provided 30 minutes of non-face-to-face time during this encounter.   [name], MD

## 2019-07-30 ENCOUNTER — Ambulatory Visit: Payer: Self-pay | Admitting: Licensed Clinical Social Worker

## 2019-07-30 NOTE — Chronic Care Management (AMB) (Signed)
  Care Management   Follow Up Note   07/30/2019 Name: Nicholas [name] MRN: [id number] DOB: [birth year]  Referred by: Venita Lick, NP Reason for referral : Care Coordination   [name] is a 74 y.o. year old male who is a primary care patient of Cannady, Barbaraann Faster, NP. The care management team was consulted for assistance with care management and care coordination needs.    Review of patient status, including review of consultants reports, relevant laboratory and other test results, and collaboration with appropriate care team members and the patient's provider was performed as part of comprehensive patient evaluation and provision of chronic care management services.    LCSW completed CCM outreach attempt today but was unable to reach patient successfully. A HIPPA compliant voice message was left encouraging patient to return call once available. LCSW rescheduled CCM SW appointment as well.  A HIPPA compliant phone message was left for the patient providing contact information and requesting a return call.   Eula Fried, BSW, MSW, [email or web address] Phone: [phone]

## 2019-08-08 ENCOUNTER — Other Ambulatory Visit: Payer: Self-pay | Admitting: Nurse Practitioner

## 2019-08-08 DIAGNOSIS — I442 Atrioventricular block, complete: Secondary | ICD-10-CM

## 2019-08-11 ENCOUNTER — Telehealth: Payer: Self-pay | Admitting: Nurse Practitioner

## 2019-08-11 NOTE — Chronic Care Management (AMB) (Signed)
  Chronic Care Management   Note  08/11/2019 Name: JACEON HEIBERGER MRN: [id number] DOB: February 07, 1945  [name] is a 74 y.o. year old male who is a primary care patient of Cannady, Barbaraann Faster, NP. KINLEY FERRENTINO is currently enrolled in care management services. An additional referral for pharmacy was placed.   Follow up plan: Telephone appointment with care management team member scheduled for:08/21/2019  Noreene Larsson, [place], [place], [place] [address] Direct Dial: [phone] [email or web address] Website: [email or web address]

## 2019-08-11 NOTE — Chronic Care Management (AMB) (Signed)
  Chronic Care Management   Note  08/11/2019 Name: LAVARR PRESIDENT MRN: [id number] DOB: [birth year]  [name] is a 74 y.o. year old male who is a primary care patient of Cannady, Barbaraann Faster, NP. BENJAMYN HESTAND is currently enrolled in care management services. An additional referral for Pharmacy was placed.   Follow up plan: Unsuccessful telephone outreach attempt made. A HIPPA compliant phone message was left for the patient providing contact information and requesting a return call.  The care management team will reach out to the patient again over the next 7 days.  If patient returns call to provider office, please advise to call [facility]  at [place], [place], [place], [place] [address] Direct Dial: [phone] [email or web address] Website: [email or web address]

## 2019-08-19 NOTE — Chronic Care Management (AMB) (Unsigned)
Chronic Care Management Pharmacy  Name: Nicholas [name]  MRN: [id number] DOB: 1945-04-30   Chief Complaint/ HPI  Nicholas [name],  74 y.o. , male presents for their Follow-Up CCM visit with the clinical pharmacist via telephone due to COVID-19 Pandemic.  PCP : Venita Lick, NP Patient Care Team: Venita Lick, NP as PCP - General (Nurse Practitioner) [name], MD (Specialist) Hollice Espy, MD as Consulting Physician (Urology) Tanda Rockers, MD as Consulting Physician (Pulmonary Disease) [name], MD as Consulting Physician (Anesthesiology) Deboraha Sprang, MD as Consulting Physician (Cardiology) Greg Cutter, LCSW as Social Worker (Licensed Clinical Social Worker) Vladimir Faster, [facility] (Pharmacist)  Their chronic conditions include: Hypertension, Hyperlipidemia, Coronary Artery Disease, GERD, Chronic Kidney Disease, Depression, Anxiety, BPH and Chronic pain syndrome   Office Visits: 07/01/19 - [name], NP- bloodwork, no med changes  Consult Visit: 6/24/21Chauncey Mann, PA- Gastroenterology-Annual visit,  decrease Protonix to 20 mg, stop scheduled  Hyoscyamine-use prn, stop probiotic 07/16/19 - Dr. Andree Elk - pain mgmnt, Lortab refill, follow up q  2 months 06/11/19- Missed cardiology appt  03/25/19 -Dr. Caryl Comes, Cardiology - Pacer check, EKG, bmp/cbc, midodrine 5 mg bi (after waking and after nap in pm) orthostatic hypotension  No Known Allergies  Medications: Outpatient Encounter Medications as of 08/21/2019  Medication Sig  . acetaminophen (TYLENOL) 650 MG CR tablet Take 650 mg by mouth daily.   Marland Kitchen apixaban (ELIQUIS) 5 MG TABS tablet Take 1 tablet (5 mg total) by mouth 2 (two) times daily.  Marland Kitchen ascorbic acid (VITAMIN C) 250 MG tablet Take 250 mg by mouth daily.   . Cranberry 500 MG CAPS Take 1 capsule by mouth daily.   . Cyanocobalamin (B-12) 1000 MCG CAPS Take 1,000 mcg by mouth daily.   . ferrous sulfate 325 (65 FE) MG tablet Take 325 mg by mouth daily with  breakfast.  . flecainide (TAMBOCOR) 100 MG tablet Take 1 tablet (100 mg total) by mouth 2 (two) times daily.  Marland Kitchen gabapentin (NEURONTIN) 400 MG capsule Take 1 capsule (400 mg total) by mouth daily.  Marland Kitchen HYDROcodone-acetaminophen (NORCO/VICODIN) 5-325 MG tablet Take 1 tablet by mouth every 6 (six) hours as needed for moderate pain or severe pain.  . hyoscyamine (LEVSIN, ANASPAZ) 0.125 MG tablet Take 0.125 mg by mouth every 6 (six) hours as needed for bladder spasms or cramping.   Marland Kitchen LORazepam (ATIVAN) 1 MG tablet Take 1 tablet (1 mg total) by mouth at bedtime.  . Magnesium 400 MG TABS Take 400 mg by mouth daily.   . Melatonin 10 MG TABS Take 10 mg by mouth at bedtime.   . methocarbamol (ROBAXIN) 500 MG tablet Take 1 tablet (500 mg total) by mouth 2 (two) times daily as needed for muscle spasms.  . midodrine (PROAMATINE) 5 MG tablet Take 1 tablet (5 mg) in the morning after waking up and take 1 tablet (5 mg) in the afternoon after you nap  . Multiple Vitamin (MULTI-VITAMINS) TABS Take 1 tablet by mouth daily.   . Omega-3 Fatty Acids (FISH OIL) 1200 MG CAPS Take 1,200 mg by mouth daily.  Marland Kitchen oxymetazoline (AFRIN) 0.05 % nasal spray Place 2 sprays into both nostrils at bedtime as needed for congestion.  . pantoprazole (PROTONIX) 40 MG tablet TAKE 1 TABLET (40 MG TOTAL) BY MOUTH ONCE DAILY TAKE 30 MINS BEFORE MEAL  . Probiotic Product ([place]) CAPS Take by mouth.  . QUEtiapine Fumarate (SEROQUEL XR) 150 MG 24 hr tablet  TAKE 1 TABLET (150 MG TOTAL) BY MOUTH AT BEDTIME.  . simvastatin (ZOCOR) 20 MG tablet Take 1 tablet (20 mg total) by mouth daily.  . solifenacin (VESICARE) 10 MG tablet Take 1 tablet (10 mg total) by mouth daily.  Marland Kitchen triamcinolone cream (KENALOG) 0.1 % Apply 1 application topically 2 (two) times daily.  Marland Kitchen venlafaxine XR (EFFEXOR XR) 75 MG 24 hr capsule Take 3 capsules (225 mg total) by mouth daily.   No facility-administered encounter medications on file as of 08/21/2019.      Current Diagnosis/Assessment:    Goals Addressed   None     {CHL HP Upstream Pharmacy Diagnosis/[phone]}   Supine Hypertension/ Orthostatic Hypotension /Afib ?  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 Flecainide 100 mg bid  We discussed {CHL HP Upstream Pharmacy discussion:[id number]}  Plan  Continue {CHL HP Upstream Pharmacy Plans:[phone]}   Hyperlipidemia/CAD Complete heart block   LDL goal < ***  Lipid Panel     Component Value Date/Time   CHOL 160 07/01/2019 1631   CHOL 198 01/03/2018 1610   TRIG 116 07/01/2019 1631   TRIG 264 (H) 01/03/2018 1610   HDL 53 07/01/2019 1631   LDLCALC 86 07/01/2019 1631    Hepatic Function Latest Ref Rng & Units 07/16/2018 01/05/2018 01/03/2018  Total Protein 6.0 - 8.5 g/dL 6.6 7.9 -  Albumin 3.7 - 4.7 g/dL 4.5 4.7 -  AST 0 - 40 IU/L 27 30 34  ALT 0 - 44 IU/L '15 20 24  ' Alk Phosphatase 39 - 117 IU/L 50 50 -  Total Bilirubin 0.0 - 1.2 mg/dL 0.5 1.0 -  Bilirubin, Direct 0.0 - 0.3 mg/dL - - -     The 10-year ASCVD risk score Mikey Bussing DC Jr., et al., 2013) is: 26.2%   Values used to calculate the score:     Age: 69 years     Sex: Male     Is Non-Hispanic African American: No     Diabetic: No     Tobacco smoker: No     Systolic Blood Pressure: 891 mmHg     Is BP treated: No     HDL Cholesterol: 53 mg/dL     Total Cholesterol: 160 mg/dL   Patient has failed these  meds in past: *** Patient is currently {CHL Controlled/[phone]} on the following medications:  . Simvastatin 20 mg qd  We discussed:  {CHL HP Upstream Pharmacy discussion:[id number]}  Plan  Continue {CHL HP Upstream Pharmacy Plans:[phone]}  H/O pulmonary embolism   CBC Latest Ref Rng & Units 03/25/2019 07/16/2018 01/05/2018  WBC 3.4 - 10.8 x10E3/uL 8.0 6.1 7.5  Hemoglobin 13.0 - 17.7 g/dL 15.6 14.7 16.3  Hematocrit 37.5 - 51.0 % 46.8 44.5 49.2  Platelets 150 - 450 x10E3/uL 192 164 194    CHADSVASc 2 Patient has failed these meds in past: *** Patient is currently {CHL Controlled/[phone]} on the following medications:  . Apixaban 5 mg bid  Ferrous sulfate 325 mg qd  We discussed:  ***  Plan  Continue {CHL HP Upstream Pharmacy Plans:[phone]}  Chronic Pain   Patient has failed these meds in past: *** Patient is currently {CHL Controlled/[phone]} on the following medications:  . Hydrocodone/APAP 5/325 mg 1 tab q 6 h prn . Acetaminophen 650 mg qd . Gabapentin 400 mg qd . ***? Methocarbamol 500 mg bid  We discussed:  ***  Plan  Continue {CHL HP Upstream Pharmacy Plans:[phone]}    Depression / Anxiety   PHQ9 Score:  PHQ9 SCORE ONLY 07/01/2019 04/21/2019 03/26/2019  PHQ-9 Total Score 1 0 1   GAD7 Score: GAD 7 : Generalized Anxiety Score 08/14/2018 07/02/2017  Nervous, Anxious, on Edge 0 1  Control/stop worrying 1 1  Worry too much - different things 2 2  Trouble relaxing 0 1  Restless 0 0  Easily annoyed or irritable 0 1  Afraid - awful might happen 1 2  Total GAD 7 Score 4 8  Anxiety Difficulty Not difficult at all Somewhat difficult    Patient has failed these meds in past: fluoxetine, vortioxetine Patient is currently {CHL Controlled/[phone]} on the following medications:  . Lorazepam 1 mg qhs . Quetiapine xr 150 mg qhs . Effexor XR 225 mg qd  We discussed:  ***  Plan  Continue {CHL HP  Upstream Pharmacy QIWLN:[id number]}   GERD   Patient has failed these meds in past: *** Patient is currently {CHL Controlled/[phone]} on the following medications:  . ***Protonix 20 vs 40 . Hyoscyamine 0.180m q6h prn . ***? probiotic  We discussed:  ***  Plan  Continue {CHL HP Upstream Pharmacy Plans:[phone]}  BPH   No results found for: PSA   Patient has failed these meds in past: *** Patient is currently {CHL Controlled/[phone]} on the following medications:  . Solfenacin 10 mg qd  We discussed:  ***  Plan  Continue {CHL HP Upstream Pharmacy Plans:[phone]}  Misc/OTC   Patient has failed these meds in past: *** Patient is currently {CHL Controlled/[phone]} on the following medications:  . Oxyetazoline nasal spray .   We discussed:  ***  Plan  Continue {CHL HP Upstream Pharmacy Plans:[phone]}   Medication Management   Pt uses [facility] for all medications Uses pill box? {Yes or If no, why not?:20788} Pt endorses ***% compliance  We discussed: ***  Plan  {US Pharmacy PEYCX:44818}   Follow up: *** month phone visit  ***

## 2019-08-21 ENCOUNTER — Ambulatory Visit: Payer: Medicare Other

## 2019-08-22 ENCOUNTER — Telehealth: Payer: Self-pay | Admitting: Pharmacist

## 2019-08-22 NOTE — Progress Notes (Signed)
  Chronic Care Management   Outreach Note  08/22/2019 Name: Nicholas [name] MRN: [id number] DOB: 1945/01/27  Referred by: Venita Lick, NP Reason for referral : No chief complaint on file.   An unsuccessful telephone outreach was attempted today. The patient was referred to the pharmacist for assistance with care management and care coordination.    Follow Up Plan:  Left message for him to return my call at his convenience. Will outreach patient inext 4-6 weeks for continued medication management concerns.   Junita Push. Kenton Kingfisher PharmD, [facility] [phone]

## 2019-08-26 NOTE — Telephone Encounter (Signed)
Pt has been r/s for 08/28/2019

## 2019-08-28 ENCOUNTER — Ambulatory Visit: Payer: Medicare Other | Admitting: Pharmacist

## 2019-08-28 DIAGNOSIS — I4891 Unspecified atrial fibrillation: Secondary | ICD-10-CM

## 2019-08-28 DIAGNOSIS — I10 Essential (primary) hypertension: Secondary | ICD-10-CM

## 2019-08-28 DIAGNOSIS — E785 Hyperlipidemia, unspecified: Secondary | ICD-10-CM

## 2019-08-28 DIAGNOSIS — I951 Orthostatic hypotension: Secondary | ICD-10-CM

## 2019-08-28 NOTE — Chronic Care Management (AMB) (Signed)
Chronic Care Management Pharmacy  Name: Nicholas [name]  MRN: [id number] DOB: [birth year]   Chief Complaint/ HPI  Nicholas [name],  74 y.o. , male presents for their Follow-Up CCM visit with the clinical pharmacist via telephone due to COVID-19 Pandemic. I spoke with spouse, Nicholas [name], per Mr. Kurkowski's request.  PCP : Venita Lick, NP Patient Care Team: Venita Lick, NP as PCP - General (Nurse Practitioner) [name], MD (Specialist) Hollice Espy, MD as Consulting Physician (Urology) Tanda Rockers, MD as Consulting Physician (Pulmonary Disease) [name], MD as Consulting Physician (Anesthesiology) Deboraha Sprang, MD as Consulting Physician (Cardiology) Greg Cutter, LCSW as Social Worker (Licensed Clinical Social Worker) Vladimir Faster, [facility] (Pharmacist)  Their chronic conditions include: Hypertension, Hyperlipidemia, Coronary Artery Disease, GERD, Chronic Kidney Disease, Depression, Anxiety, BPH and Chronic pain syndrome   Office Visits: 07/01/19 - [name], NP- bloodwork, no med changes  Consult Visit: 6/24/21Chauncey Mann, PA- Gastroenterology-Annual visit,  decrease Protonix to 20 mg, stop scheduled  Hyoscyamine-use prn, stop probiotic 07/16/19 - Dr. Andree Elk - pain mgmnt, Lortab refill, follow up q  2 months 06/11/19- Missed cardiology appt  03/25/19 -Dr. Caryl Comes, Cardiology - Pacer check, EKG, bmp/cbc, midodrine 5 mg bid (after waking and after nap in pm) orthostatic hypotension  No Known Allergies  Medications: Outpatient Encounter Medications as of 08/28/2019  Medication Sig  . apixaban (ELIQUIS) 5 MG TABS tablet Take 1 tablet (5 mg total) by mouth 2 (two) times daily.  Marland Kitchen ascorbic acid (VITAMIN C) 250 MG tablet Take 1,000 mg by mouth daily.   . Cranberry 500 MG CAPS Take 1 capsule by mouth daily.   . Cyanocobalamin (B-12) 1000 MCG CAPS Take 1,000 mcg by mouth daily.   . ferrous sulfate 325 (65 FE) MG tablet Take 325 mg by mouth daily with  breakfast.  . flecainide (TAMBOCOR) 100 MG tablet Take 1 tablet (100 mg total) by mouth 2 (two) times daily.  Marland Kitchen gabapentin (NEURONTIN) 400 MG capsule Take 1 capsule (400 mg total) by mouth daily.  Marland Kitchen HYDROcodone-acetaminophen (NORCO/VICODIN) 5-325 MG tablet Take 1 tablet by mouth every 6 (six) hours as needed for moderate pain or severe pain. (Patient taking differently: Take 1 tablet by mouth. Taking bid)  . LORazepam (ATIVAN) 1 MG tablet Take 1 tablet (1 mg total) by mouth at bedtime.  . Magnesium 400 MG TABS Take 400 mg by mouth daily.   . Melatonin 10 MG TABS Take 10 mg by mouth at bedtime.   . midodrine (PROAMATINE) 5 MG tablet Take 1 tablet (5 mg) in the morning after waking up and take 1 tablet (5 mg) in the afternoon after you nap  . Multiple Vitamin (MULTI-VITAMINS) TABS Take 1 tablet by mouth daily.   . Omega-3 Fatty Acids (FISH OIL) 1200 MG CAPS Take 1,200 mg by mouth daily.  Marland Kitchen oxymetazoline (AFRIN) 0.05 % nasal spray Place 2 sprays into both nostrils at bedtime as needed for congestion.  . pantoprazole (PROTONIX) 40 MG tablet 20 mg daily. Patient taking differently- has been taking 1/2 tablet.  . QUEtiapine Fumarate (SEROQUEL XR) 150 MG 24 hr tablet TAKE 1 TABLET (150 MG TOTAL) BY MOUTH AT BEDTIME.  . simvastatin (ZOCOR) 20 MG tablet Take 1 tablet (20 mg total) by mouth daily.  . solifenacin (VESICARE) 10 MG tablet Take 1 tablet (10 mg total) by mouth daily.  Marland Kitchen venlafaxine XR (EFFEXOR XR) 75 MG 24 hr capsule Take 3 capsules (225 mg  total) by mouth daily.  Marland Kitchen acetaminophen (TYLENOL) 500 MG tablet Take 2,000 mg by mouth 2 (two) times daily as needed. (Patient not taking: Reported on 08/31/2019)  . acetaminophen (TYLENOL) 650 MG CR tablet Take 650 mg by mouth daily.  (Patient not taking: Reported on 08/28/2019)  . hyoscyamine (LEVSIN, ANASPAZ) 0.125 MG tablet Take 0.125 mg by mouth every 6 (six) hours as needed for bladder spasms or cramping.  (Patient not taking: Reported on 08/28/2019)  .  methocarbamol (ROBAXIN) 500 MG tablet Take 1 tablet (500 mg total) by mouth 2 (two) times daily as needed for muscle spasms. (Patient not taking: Reported on 08/28/2019)  . Probiotic Product ([place]) CAPS Take by mouth. (Patient not taking: Reported on 08/28/2019)  . triamcinolone cream (KENALOG) 0.1 % Apply 1 application topically 2 (two) times daily. (Patient not taking: Reported on 08/28/2019)   No facility-administered encounter medications on file as of 08/28/2019.     Current Diagnosis/Assessment:  SDOH Interventions     Most Recent Value  SDOH Interventions  Financial Strain Interventions Other (Comment)  [will pursue patient for Eliquis or comparable]      Goals Addressed            This Visit's Progress   . PharmD "I need help with Eliquis copay"       CARE PLAN ENTRY (see longitudinal plan of care for additional care plan information)  Current Barriers:  . Chronic Disease Management support, education, and care coordination needs related to Hypertension, Hyperlipidemia, Atrial Fibrillation, and Coronary Artery Disease Eliquis copay recently increased causing financial strain.    Hypertension with orthostatic hypotension BP Readings from Last 3 Encounters:  07/01/19 128/78  03/26/19 127/82  03/25/19 110/72   Currently taking midodrine 5 mg bid.   Hyperlipidemia Lab Results  Component Value Date/Time   LDLCALC 86 07/01/2019 04:31 PM   Currently taking simvastatin 20 mg daily and fish oil 1200 mg qd.   AFIB with H/O PE and DVT  Flecainide 100 mg bid  Eliquis 5 mg bid    :  21Per Mrs. Masso's report Mr. Sessler is doing well on above regimen. Their copayment for Eliquis has recently gone from $40 to >$100 and she assumes this is because of the coverage gap.   Patient and spouse income exceed limits for PAP. Options include use of free 30 day voucher for Eliquis  this is only available once per lifetime). Another option is converting to Xarelto for  $85/month through [employer]. Per [place], patient is not opposed to switching agents if necessary.   Will follow up with patient next week and coordinate with Cardiology for authorization to switch to Xarelto if preferable to patient.60   . Pharmacist Clinical Goal(s): o Over the next 90 days, patient will work with PharmD and providers to maintain BP goal <130/80, achieve LDL goal< 70, increase access to Eliquis or other alternative . Interventions: . Comprehensive medication review performed, medication list updated in electronic medical record . Inter-disciplinary care team collaboration (see longitudinal plan of care)  . Will pursue affordable option for anticoagulation . 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, 1st degree AV block, 2/19 A fib--flecainide started BP goal is:  <130/80  Office blood pressures are  BP Readings from Last 3 Encounters:  07/01/19 128/78  03/26/19 127/82  03/25/19 110/72   CMP Latest Ref Rng & Units 07/01/2019 03/25/2019 07/16/2018  Glucose 65 - 99 mg/dL 116(H) 131(H) 154(H)  BUN 8 - 27 mg/dL '22 21 21  ' Creatinine 0.76 - 1.27 mg/dL 1.17 1.46(H) 1.11  Sodium 134 - 144 mmol/L 143 141 140  Potassium 3.5 - 5.2 mmol/L 4.6 4.7 4.3  Chloride 96 - 106 mmol/L 104 100 102  CO2 20 - 29 mmol/L '22 22 23  ' Calcium 8.6 - 10.2 mg/dL 9.1 9.7 9.1  Total Protein 6.0 - 8.5 g/dL - - 6.6  Total Bilirubin 0.0 - 1.2 mg/dL - - 0.5  Alkaline Phos 39 - 117 IU/L - - 50  AST 0 - 40 IU/L - - 27  ALT 0 - 44 IU/L - - 15     Patient checks BP at home when he  experiences orthostatic episodes. Patient home BP readings are ranging: SBP 60-70s during these episodes Office BP:127/82, 128/78 Patient has failed these meds in the past: Azor 10/40 mg, furosemide Patient is currently controlled on the following medications:  Marland Kitchen Midodrine 5 mg bid (after waking and after nap) . Flecainide 100 mg bid  We discussed patient is having fewer episodes for orthostasis and dizziness since starting midodrine per his spouse. He have able to play more golf and is out on the [name] today. Counseled on using caution when changing position,utilizing handrails and sitting on the edge of beds, chairs before abruptly getting up  Plan  Continue current medications   AFIB H/O PE & DVT   Patient is currently rhythm controlled. Office heart rates are  Pulse Readings from Last 3 Encounters:  07/01/19 76  03/26/19 66  03/25/19 90    CHA2DS2-VASc Score =   2 (per Dr. Caryl Comes -cards) The patient's score is based upon: (age -28, HTN -1 )  .   Current regimen:  Flecainide 100 mg bid  Eliquis 5 mg bid    :[id number]}    ASSESSMENT AND PLAN: Persistent Atrial Fibrillation    We discussed: Per Mrs. Star's report Mr. Minerva is doing well on above regimen. Their copayment for Eliquis has recently gone from $40 to >$100 and she assumes this is because of the coverage gap.   Patient and spouse income exceed limits for PAP. Options include use of free 30 day voucher for Eliquis  this is only available once per lifetime). Another option is converting to Xarelto for $85/month through [employer]. Per [place], patient is not opposed to switching agents if necessary.  Will follow up with patient next week and coordinate with Cardiology for authorization to switch to Xarelto if preferable to patient.       Hyperlipidemia/CAD Complete heart block   LDL goal < 70  Lipid Panel     Component Value Date/Time   CHOL 160 07/01/2019 1631   CHOL 198 01/03/2018 1610   TRIG 116  07/01/2019 1631   TRIG 264 (H) 01/03/2018 1610   HDL 53 07/01/2019 1631   LDLCALC 86 07/01/2019 1631    Hepatic Function Latest Ref Rng & Units 07/16/2018 01/05/2018 01/03/2018  Total Protein 6.0 - 8.5 g/dL 6.6 7.9 -  Albumin 3.7 - 4.7 g/dL 4.5 4.7 -  AST 0 - 40  IU/L 27 30 34  ALT 0 - 44 IU/L '15 20 24  ' Alk Phosphatase 39 - 117 IU/L 50 50 -  Total Bilirubin 0.0 - 1.2 mg/dL 0.5 1.0 -  Bilirubin, Direct 0.0 - 0.3 mg/dL - - -     The 10-year ASCVD risk score Mikey Bussing DC Jr., et al., 2013) is: 26.2%   Values used to calculate the score:     Age: 39 years     Sex: Male     Is Non-Hispanic African American: No     Diabetic: No     Tobacco smoker: No     Systolic Blood Pressure: 530 mmHg     Is BP treated: No     HDL Cholesterol: 53 mg/dL     Total Cholesterol: 160 mg/dL   Patient has failed these meds in past:none in chart Patient is currently controlled on the following medications:  . Simvastatin 20 mg qd . Omega 3 fatty acids 1200 mg daily  We discussed:  Recommmended effective dose of fish oil is 4 grams daily. Patient ASCVD risk and LDL not quite at goal.   Plan Recommend patient take simvastatin every day if not doing so. If so, consider changing to high intensity statin. Condsider increasing fish oil to efffective 4 g/daily dose or could discontinue.      Chronic Pain   Patient has failed these meds in past: unknown Patient is currently controlled on the following medications:  . Hydrocodone/APAP 5/325 mg 1 tab q 6 h prn . Gabapentin 400 mg qd . Lorazepam 1 mg at bedtime  We discussed: Not taking lorazepam at the same time as hydrocodone. Increased risk of sedation with melatonin and lorzaepam. Patient is able to play more golf and is feeling better.  Plan  Continue current medications.Considering discontinuing melatonin.    Depression / Anxiety   PHQ9 Score:  PHQ9 SCORE ONLY 07/01/2019 04/21/2019 03/26/2019  PHQ-9 Total Score 1 0 1   GAD7 Score: GAD 7 :  Generalized Anxiety Score 08/14/2018 07/02/2017  Nervous, Anxious, on Edge 0 1  Control/stop worrying 1 1  Worry too much - different things 2 2  Trouble relaxing 0 1  Restless 0 0  Easily annoyed or irritable 0 1  Afraid - awful might happen 1 2  Total GAD 7 Score 4 8  Anxiety Difficulty Not difficult at all Somewhat difficult    Patient has failed these meds in past: fluoxetine, vortioxetine Patient is currently controlled on the following medications:  . Lorazepam 1 mg qhs . Quetiapine xr 150 mg qhs . Effexor XR 225 mg qd  We discussed:  Not taking hydrodocodone and lorazepam.  Plan  Continue current medications   GERD   Patient has failed these meds in past: None Patient is currently controlled on the following medications:  . Protonix 20 mg qd-- (patient has been taking half of a 40 mg)   We discussed:  Pantoprazole tablets should not  Be cut , crushed or chewed.   Plan  Recommend patient get new prescription for 20 mg pantoprazole dose.  OAB?   No results found for: PSA   Patient has failed these meds in past: Unknown Patient is currently controlled on the following medications:  . Solfenacin 10 mg qd . Cranberrry 516m qd  We discussed anitcholinergic side effects of this solfenacin..Patient seems satisfied with this regimen and reports less urinary symptoms he attributes to the cranberry supplement per wife's report. Will readdress in futures.  Plan  Continue current medications. Recommend adding diagnosis code for this therapy  Misc/OTC   . Oxyetazoline nasal spray . Cyanocobalamin 1072mg daily . Ferrous sulfate 325 mg qd . Magnesium 4071mqd . Melatonin 10 mg qhs  We discussed:  Rebound congestion with extended [facility]in use [facility], denies congestion and is unwilling to try anything else. Increased sedation with melatonin, lorazepam and hydrocodone  Plan  Continue current medications. Readdress with patient  next visit.   Medication Management   Pt uses [facility] for all medications Uses pill box? Yes. Spouse manages meds Pt endorses 90% compliance   Plan  Continue current medication management strategy    Follow up:3 month phone visit  JuJunita PushHaKenton KingfisherharmD, BCWagneramily Practice [phone]

## 2019-08-31 NOTE — Patient Instructions (Addendum)
Visit Information   It was a pleasure speaking with you today! Thank you for letting me be a part of your care team. Please call with any questions or concerns.  Goals Addressed            This Visit's Progress   . PharmD "I need help with Eliquis copay"       CARE PLAN ENTRY (see longitudinal plan of care for additional care plan information)  Current Barriers:  . Chronic Disease Management support, education, and care coordination needs related to Hypertension, Hyperlipidemia, Atrial Fibrillation, and Coronary Artery Disease Eliquis copay recently increased causing financial strain.    Hypertension with orthostatic hypotension BP Readings from Last 3 Encounters:  07/01/19 128/78  03/26/19 127/82  03/25/19 110/72   Currently taking midodrine 5 mg bid.   Hyperlipidemia Lab Results  Component Value Date/Time   LDLCALC 86 07/01/2019 04:31 PM   Currently taking simvastatin 20 mg daily and fish oil 1200 mg qd.   AFIB with H/O PE and DVT  Flecainide 100 mg bid  Eliquis 5 mg bid    :  21Per Nicholas [name]'s report Nicholas [name] is doing well on above regimen. Their copayment for Eliquis has recently gone from $40 to >$100 and she assumes this is because of the coverage gap.   Patient and spouse income exceed limits for PAP. Options include use of free 30 day voucher for Eliquis  this is only available once per lifetime). Another option is converting to Xarelto for $85/month through [employer]. Per [name], patient is not opposed to switching agents if necessary.   Will follow up with patient next week and coordinate with Cardiology for authorization to switch to Xarelto if preferable to patient.60   . Pharmacist Clinical Goal(s): o Over the next 90 days, patient will work with PharmD and providers to maintain BP goal <130/80, achieve LDL goal< 70, increase access to Eliquis or other alternative . Interventions: . Comprehensive medication review performed, medication list  updated in electronic medical record . Inter-disciplinary care team collaboration (see longitudinal plan of care)  . Will pursue affordable option for anticoagulation . Patient self care activities - Over the next 90days, patient will: o Check BP  twice daily document, and provide at future appointments o Ensure daily salt intake < 2300 mg/day o Focus on medication adherence by utilizing pill box o Take medications as prescribed o Report any questions or concerns to PharmD and/or provider(s)  Initial goal documentation        Nicholas [name] was given information about Chronic Care Management services today including:  1. CCM service includes personalized support from designated clinical staff supervised by his physician, including individualized plan of care and coordination with other care providers 2. 24/7 contact phone numbers for assistance for urgent and routine care needs. 3. Standard insurance, coinsurance, copays and deductibles apply for chronic care management only during months in which we provide at least 20 minutes of these services. Most insurances cover these services at 100%, however patients may be responsible for any copay, coinsurance and/or deductible if applicable. This service may help you avoid the need for more expensive face-to-face services. 4. Only one practitioner may furnish and bill the service in a calendar month. 5. The patient may stop CCM services at any time (effective at the end of the month) by phone call to the office staff.  Patient agreed to services and verbal consent obtained.   The patient verbalized understanding of instructions provided today and  agreed to receive a mailed copy of patient instruction and/or educational materials. The pharmacy team will reach out to the patient again over the next 7 days.   Junita Push. Nicholas [name] PharmD, BCPS Clinical Pharmacist [phone]  Preventing Atrial Fibrillation-Related Stroke  Atrial fibrillation is a  common type of irregular or rapid heartbeat (arrhythmia) that greatly increases your risk for a stroke. In atrial fibrillation, the top portions of the heart (atria) beat out of sync with the lower portions of the heart. When the muscles of the atria are tightening in an uncoordinated way (fibrillating), blood can pool in the heart and form clots. If a clot travels to the brain, it can cause a stroke. This type of stroke is preventable. Understanding atrial fibrillation and knowing how to properly manage it can prevent you from having a stroke. What increases my risk for a stroke? If you have atrial fibrillation, you may be at increased risk for a stroke if you also:  Have heart failure.  Have high blood pressure.  Are older than age 37.  Have diabetes.  Have a history of vascular disease, such as heart attack or stroke.  Are male. If you have atrial fibrillation and you also have one or more of those risk factors, talk with your health care provider about treatments that can prevent a stroke. Other risk factors for a stroke include:  Smoking.  High cholesterol.  Diabetes.  Being inactive (sedentary lifestyle).  Having a family history of stroke.  Eating a diet that is high in fat, cholesterol, and salt. What treatments help to manage atrial fibrillation? The main goals of treatment for atrial fibrillation are to prevent blood clots from forming and to keep your heart beating at a normal rate and rhythm. Treatment may include:  Blood-thinning medicine (anticoagulant) that helps to prevent clots from forming. This medicine also increases the risk of bleeding. Talk with your health care provider about the risks and benefits of taking anticoagulants.  Medicine that slows the heart rate or brings the heart rhythm back to normal.  Electrical cardioversion. This is a procedure that resets the heart's rhythm by delivering a controlled, low-energy shock through your skin to your  heart.  An ablation procedure, such as catheter ablation, catheter ablation with pacemaker, or surgical ablation. These procedures destroy the heart tissues that send abnormal signals so that heart rhythms can be improved or made normal. A pacemaker is a device that is placed under the skin to help the heart beat in a regular rhythm. How can I prevent atrial fibrillation-related stroke? Medicines  Take over-the-counter and prescription medicines only as told by your health care provider.  If your health care provider prescribed an anticoagulant, take it exactly as told. Taking too much blood-thinning medicine can cause bleeding. If you do not take enough blood-thinning medicine, you will not have the protection that you need against stroke and other problems. Eating and drinking  Eat healthy foods, including at least 5 servings of fruits and vegetables a day.  Do not drink alcohol.  Do not drink beverages that contain caffeine, such as coffee, soda, and tea.  Follow dietary instructions as told by your health care provider. Managing other medical conditions  Manage and be aware of your blood pressure. If you have high blood pressure, follow your treatment plan to keep it in your target range.  Have your cholesterol checked as often as recommended by your health care provider. If you have high cholesterol, follow your treatment plan to  lower it and keep it in your target range.  Talk with your health care provider about symptoms to watch for. Some people may not have any symptoms, so it can be hard to know that they have atrial fibrillation. Talk with your health care provider if you experience: ? A feeling that your heart is beating rapidly or irregularly. ? An irregular pulse. ? A feeling of discomfort or pain in your chest. ? Shortness of breath. ? Sudden light-headedness or weakness. ? Tiredness (fatigue) that happens easily during exercise.  If you have obstructive sleep apnea  (OSA), manage your condition as told by your health care provider. General instructions  Maintain a healthy weight. Do not use diet pills unless your health care provider approves. Diet pills may make heart problems worse.  Exercise regularly. Get at least 30 minutes of activity on most or all days, or as told by your health care provider.  Do not use any products that contain nicotine or tobacco, such as cigarettes and e-cigarettes. If you need help quitting, ask your health care provider.  Do not use drugs, such as cocaine and amphetamines.  Keep all follow-up visits as told by your health care providers. This is important. These include visits with your heart specialist. Where to find more information You may find more information about preventing atrial fibrillation-related stroke from:  National Stroke Association (AFib-Stroke Connection): www.stroke.org Contact a health care provider if:  You notice a change in the rate, rhythm, or strength of your heartbeat.  You have dizziness.  You are taking an anticoagulant and you have more bruises than usual.  You tire out more easily when you exercise or do similar activities. Get help right away if:   You have chest pain.  You have pain in your abdomen.  You experience unusual sweating or weakness.  You take anticoagulants and you: ? Have severe headaches or confusion. ? Have blood in your vomit, bowel movement, or urine. ? Have bleeding that will not stop. ? Fall or injure your head.  You have any symptoms of a stroke. "BE FAST" is an easy way to remember the main warning signs of a stroke: ? B - Balance. Signs are dizziness, trouble walking, or loss of balance. ? E - Eyes. Signs are trouble seeing or a sudden change in vision. ? F - Face. Signs are sudden weakness or numbness of the face, or the face or eyelid drooping on one side. ? A - Arms. Signs are weakness or numbness in an arm. This happens suddenly and usually on  one side of the body. ? S - Speech. Signs are sudden trouble speaking, slurred speech, or trouble understanding what people say. ? T - Time. Time to call emergency services. Write down what time symptoms started.  You have other signs of a stroke, such as: ? A sudden, severe headache with no known cause. ? Nausea or vomiting. ? Seizure. These symptoms may represent a serious problem that is an emergency. Do not wait to see if the symptoms will go away. Get medical help right away. Call your local emergency services (911 in the [place] Do not drive yourself to the hospital. Summary  Having atrial fibrillation increases the risk for a stroke. Talk with your health care provider about what symptoms to watch for.  Atrial fibrillation-related stroke is preventable. Proper management of atrial fibrillation can prevent you from having a stroke.  Talk with your health care provider about whether anticoagulant medicine is right for  you.  Learn the warning signs of a stroke and remember "BE FAST." This information is not intended to replace advice given to you by your health care provider. Make sure you discuss any questions you have with your health care provider. Document Revised: 05/06/2018 Document Reviewed: 04/26/2016 Elsevier Patient Education  [place] [facility], 44(10), 748. [email or web address]">  How to Increase Your Level of Physical Activity Getting regular physical activity is important for your overall health and well-being. Most people do not get enough exercise. There are easy ways to increase your level of physical activity, even if you have not been very active in the past or if you are just starting out. How can increasing my physical activity affect me? Physical activity has many short-term and long-term benefits. Being active on a regular basis can improve your physical and mental health as well as provide  other benefits. Physical health benefits  Helping you lose weight or maintain a healthy weight.  Strengthening your muscles and bones.  Reducing your risk of certain long-term (chronic) diseases, including heart disease, cancer, and diabetes.  Being able to move around more easily and for longer periods of time without getting tired (increased stamina).  Improving your ability to fight off illness (enhanced immunity).  Being able to sleep better.  Helping you stay healthy as you get older, including: ? Helping you stay mobile, or capable of walking and moving around. ? Preventing accidents, such as falls. ? Increasing life expectancy. Mental health benefits  Boosting your mood and improving your self-esteem.  Lowering your chance of having mental health problems, such as depression or anxiety.  Helping you feel good about your body. Other benefits  Finding new sources of fun and enjoyment.  Meeting new people who share a common interest. What steps can I take to be more physically active? Getting started  If you have a chronic illness or have not been active for a while, check with your health care provider about how to get started. Ask your health care provider what activities are safe for you.  Start out slowly. Walking or doing some simple chair exercises is a good place to start, especially if you have not been active before or for a long time.  Set goals that you can work toward. Ask your health care provider how much exercise is best for you. In general, most adults should: ? Do moderate-intensity exercise for at least 150 minutes each week (30 minutes on most days of the week) or vigorous exercise for at least 75 minutes each week, or a combination of these.  Moderate-intensity exercise can include walking at a quick pace, biking, yoga, water aerobics, or gardening.  Vigorous exercise involves activities that take more effort, such as jogging or running, playing  sports, swimming laps, or jumping rope. ? Do strength exercises on at least 2 days each week. This can include weight lifting, body weight exercises, and resistance-band exercises.  Consider using a fitness tracker, such as a mobile phone app or a device worn like a watch, that will count the number of steps you take each day. Many people strive to reach 10,000 steps a day. Choosing activities  Try to find activities that you enjoy. You are more likely to commit to an exercise routine if it does not feel like a chore.  If you have bone or joint problems, choose low-impact exercises, like walking or swimming.  Use these tips for being successful  with an exercise plan: ? Find a workout partner for accountability. ? Join a group or class, such as an aerobics class, cycling class, or sports team. ? Make family time active. Go for a walk, bike, or swim. ? Include a variety of exercises each week. Being active in your daily routines Besides your formal exercise plans, you can find ways to do physical activity during your daily routines, such as:  Walking or biking to work or to the store.  Taking the stairs instead of the elevator.  Parking farther away from the door at work or at the store.  Planning walking meetings.  Walking around while you are on the phone.  Where to find more information  Centers for Disease Control and Prevention: WorkDashboard.es  President's Council on Fitness, Sports & Nutrition: www.fitness.gov  ChooseMyPlate: FormerBoss.no Contact a health care provider if:  You have headaches, muscle aches, or joint pain.  You feel dizzy or light-headed while exercising.  You faint.  You have chest pain while exercising. Summary  Exercise benefits your mind and body at any age, even if you are just starting out.  If you have a chronic illness or have not been active for a while, check with your health care provider before increasing your  physical activity.  Choose activities that are safe and enjoyable for you. Ask your health care provider what activities are safe for you.  Start slowly. Tell your health care provider if you have problems as you start to increase your activity level. This information is not intended to replace advice given to you by your health care provider. Make sure you discuss any questions you have with your health care provider. Document Revised: 10/14/2018 Document Reviewed: 08/05/2018 Elsevier Patient Education  [place].

## 2019-09-01 ENCOUNTER — Other Ambulatory Visit: Payer: Self-pay

## 2019-09-01 DIAGNOSIS — E785 Hyperlipidemia, unspecified: Secondary | ICD-10-CM

## 2019-09-01 MED ORDER — SIMVASTATIN 20 MG PO TABS: 20.0000 mg | ORAL_TABLET | Freq: Every day | ORAL | 4 refills | Status: DC

## 2019-09-01 NOTE — Telephone Encounter (Signed)
LOV: 07/01/19, NOV: 11/10/19  Last filled by [facility] for 90 with 4 refills.

## 2019-09-03 ENCOUNTER — Other Ambulatory Visit: Payer: Self-pay

## 2019-09-03 DIAGNOSIS — I10 Essential (primary) hypertension: Secondary | ICD-10-CM

## 2019-09-03 MED ORDER — SOLIFENACIN SUCCINATE 10 MG PO TABS: 10.0000 mg | ORAL_TABLET | Freq: Every day | ORAL | 4 refills | Status: DC

## 2019-09-03 NOTE — Telephone Encounter (Signed)
Patient last seen in June, has follow up in October.

## 2019-09-05 LAB — TOXASSURE SELECT 13 (MW), URINE

## 2019-09-10 ENCOUNTER — Telehealth: Payer: Self-pay | Admitting: Pharmacist

## 2019-09-10 ENCOUNTER — Other Ambulatory Visit: Payer: Self-pay | Admitting: Nurse Practitioner

## 2019-09-10 ENCOUNTER — Telehealth: Payer: Self-pay | Admitting: *Deleted

## 2019-09-10 MED ORDER — PANTOPRAZOLE SODIUM 20 MG PO TBEC
20.0000 mg | DELAYED_RELEASE_TABLET | Freq: Every day | ORAL | 4 refills | Status: DC
Start: 2019-09-10 — End: 2020-05-19

## 2019-09-10 NOTE — Telephone Encounter (Signed)
-----   Message from Deboraha Sprang, MD sent at 09/10/2019 11:58 AM EDT ----- Brayton Layman SK    ----- Message ----- From: Vladimir Faster, [facility] Sent: 09/10/2019  11:51 AM EDT To: Deboraha Sprang, MD  My apologies. The patient would like to switch to Xarelto.  Thank you! ----- Message ----- From: Deboraha Sprang, MD Sent: 09/10/2019  10:23 AM EDT To: Emily Filbert, RN, Vladimir Faster, [facility]   Almyra Free  am confused as to which drug you would like [place] to RX  Plz clarify and we are glad to do it   Thanks SK    ----- Message ----- From: Vladimir Faster, [facility] Sent: 09/09/2019  10:06 AM EDT To: Deboraha Sprang, MD  Hello Dr. Caryl Comes,  I'm a clinical pharmacist with [facility].  Mr. Osowski is in the coverage gap for prescriptions and his Xarelto has become cost prohibitive. He does not meet the requirements for patient assistance through [employer]. Xarelto is available for $85/month or $240/3 month supply. This is acceptable to the patient.  Would you consider switching to Xarelto? If this is acceptable please send a new prescription to [id number] - [place], [place] - [address]  Phone:  [phone] Fax:  [phone]   The [facility] are visiting [place] through September. I will coordinate transfer of prescription.  Thank you for considering, Junita Push. Kenton Kingfisher PharmD, [facility] [phone]

## 2019-09-10 NOTE — Chronic Care Management (AMB) (Signed)
°  Chronic Care Management   Note  09/10/2019 Name: Nicholas [name] MRN: [id number] DOB: Dec 23, 1945  Patient spouse Nicholas [name], called today requesting Pantoprazole 20 mg [facility] [place] as they are visiting until 10/02/19. I have coordinated with PCP for this.  Nicholas [name] also requesting updated on Eliquis/Xarelto. Informed that Eliquis no longer offers free month on refill presciptions. Request to change to Xarelto sent to Dr. Caryl Comes. Patient has approximately a two week supply of Eliquis remaining.    Junita Push. Kenton Kingfisher PharmD, [facility] [phone]

## 2019-09-10 NOTE — Telephone Encounter (Signed)
Dr. Caryl Comes- please confirm the patient's Xarelto dose would be 20 mg once daily.  Thank you!

## 2019-09-11 NOTE — Telephone Encounter (Signed)
Yes  20 mg daily

## 2019-09-12 ENCOUNTER — Telehealth: Payer: Self-pay | Admitting: Pharmacist

## 2019-09-12 MED ORDER — RIVAROXABAN 20 MG PO TABS: 20.0000 mg | ORAL_TABLET | Freq: Every day | ORAL | 3 refills | Status: DC

## 2019-09-12 NOTE — Chronic Care Management (AMB) (Signed)
  Chronic Care Management   Note  09/12/2019 Name: Nicholas [name] MRN: [id number] DOB: [birth year]   Left message for spouse, Nicholas [name], stating  prescriptions for pantoprazole 20 mg and Xarelto 20 mg [facility].         Junita Push. Kenton Kingfisher PharmD, [facility] [phone]

## 2019-09-12 NOTE — Telephone Encounter (Signed)
Great! Thank You. I will let patient's wife know.  Almyra Free

## 2019-09-12 NOTE — Telephone Encounter (Signed)
Jena Gauss,  Xarelto has been sent in for the patient.   Thank you! Nira Conn, RN (for Dr. Caryl Comes)

## 2019-09-15 ENCOUNTER — Ambulatory Visit (INDEPENDENT_AMBULATORY_CARE_PROVIDER_SITE_OTHER): Payer: Medicare Other | Admitting: *Deleted

## 2019-09-15 ENCOUNTER — Other Ambulatory Visit: Payer: Self-pay

## 2019-09-15 ENCOUNTER — Ambulatory Visit: Payer: Medicare Other | Attending: Anesthesiology | Admitting: Anesthesiology

## 2019-09-15 ENCOUNTER — Encounter: Payer: Self-pay | Admitting: Anesthesiology

## 2019-09-15 DIAGNOSIS — I442 Atrioventricular block, complete: Secondary | ICD-10-CM | POA: Diagnosis not present

## 2019-09-15 DIAGNOSIS — G894 Chronic pain syndrome: Secondary | ICD-10-CM

## 2019-09-15 DIAGNOSIS — F1190 Opioid use, unspecified, uncomplicated: Secondary | ICD-10-CM

## 2019-09-15 DIAGNOSIS — M5431 Sciatica, right side: Secondary | ICD-10-CM

## 2019-09-15 DIAGNOSIS — M79641 Pain in right hand: Secondary | ICD-10-CM

## 2019-09-15 DIAGNOSIS — M5416 Radiculopathy, lumbar region: Secondary | ICD-10-CM | POA: Diagnosis not present

## 2019-09-15 DIAGNOSIS — M5136 Other intervertebral disc degeneration, lumbar region: Secondary | ICD-10-CM

## 2019-09-15 DIAGNOSIS — M5432 Sciatica, left side: Secondary | ICD-10-CM

## 2019-09-15 DIAGNOSIS — M79642 Pain in left hand: Secondary | ICD-10-CM

## 2019-09-15 DIAGNOSIS — M47816 Spondylosis without myelopathy or radiculopathy, lumbar region: Secondary | ICD-10-CM

## 2019-09-15 MED ORDER — HYDROCODONE-ACETAMINOPHEN 5-325 MG PO TABS: 1.0000 | ORAL_TABLET | Freq: Four times a day (QID) | ORAL | 0 refills | Status: AC | PRN

## 2019-09-15 NOTE — Progress Notes (Signed)
Virtual Visit via Telephone Note  I connected with Nicholas [name] on 09/15/19 at  2:15 PM EDT by telephone and verified that I am speaking with the correct person using two identifiers.  Location: Patient: Home Provider: Pain control center   I discussed the limitations, risks, security and privacy concerns of performing an evaluation and management service by telephone and the availability of in person appointments. I also discussed with the patient that there may be a patient responsible charge related to this service. The patient expressed understanding and agreed to proceed.   History of Present Illness: I spoke with Nicholas [name] via telephone as we were unable to connect with the video portion of the virtual conference however he reports that he is doing quite well.  He is having some left hip pain which is a newer issue for him but this has not kept him from being active.  With his medication management at twice a day dosing on hydrocodone he still able to play golf and stay active.  Currently he is traveling and doing well with this current regimen.  He does his stretching exercises as tolerated and continues to make efforts at weight loss.  No change in lower extremity strength or function or bowel or bladder function are noted.  He still having a chronic aching back pain which radiates into the hip buttock and down the legs occasionally.  No change in bowel or bladder function is noted and he continues to get good relief with his hydrocodone twice a day.  He sleeps better and has better pain relief throughout the day.  No other changes are reported today    Observations/Objective:  Current Outpatient Medications:  .  acetaminophen (TYLENOL) 500 MG tablet, Take 2,000 mg by mouth 2 (two) times daily as needed. (Patient not taking: Reported on 08/31/2019), Disp: , Rfl:  .  acetaminophen (TYLENOL) 650 MG CR tablet, Take 650 mg by mouth daily.  (Patient not taking: Reported on 08/28/2019), Disp: ,  Rfl:  .  ascorbic acid (VITAMIN C) 250 MG tablet, Take 1,000 mg by mouth daily. , Disp: , Rfl:  .  Cranberry 500 MG CAPS, Take 1 capsule by mouth daily. , Disp: , Rfl:  .  Cyanocobalamin (B-12) 1000 MCG CAPS, Take 1,000 mcg by mouth daily. , Disp: , Rfl:  .  ferrous sulfate 325 (65 FE) MG tablet, Take 325 mg by mouth daily with breakfast., Disp: , Rfl:  .  flecainide (TAMBOCOR) 100 MG tablet, Take 1 tablet (100 mg total) by mouth 2 (two) times daily., Disp: 180 tablet, Rfl: 3 .  gabapentin (NEURONTIN) 400 MG capsule, Take 1 capsule (400 mg total) by mouth daily., Disp: 90 capsule, Rfl: 1 .  [START ON 10/05/2019] HYDROcodone-acetaminophen (NORCO/VICODIN) 5-325 MG tablet, Take 1 tablet by mouth every 6 (six) hours as needed for moderate pain or severe pain., Disp: 60 tablet, Rfl: 0 .  [START ON 10/05/2019] HYDROcodone-acetaminophen (NORCO/VICODIN) 5-325 MG tablet, Take 1 tablet by mouth every 6 (six) hours as needed for moderate pain or severe pain., Disp: 60 tablet, Rfl: 0 .  hyoscyamine (LEVSIN, ANASPAZ) 0.125 MG tablet, Take 0.125 mg by mouth every 6 (six) hours as needed for bladder spasms or cramping.  (Patient not taking: Reported on 08/28/2019), Disp: , Rfl:  .  LORazepam (ATIVAN) 1 MG tablet, Take 1 tablet (1 mg total) by mouth at bedtime., Disp: 90 tablet, Rfl: 0 .  Magnesium 400 MG TABS, Take 400 mg by mouth daily. ,  Disp: , Rfl:  .  Melatonin 10 MG TABS, Take 10 mg by mouth at bedtime. , Disp: , Rfl:  .  methocarbamol (ROBAXIN) 500 MG tablet, Take 1 tablet (500 mg total) by mouth 2 (two) times daily as needed for muscle spasms. (Patient not taking: Reported on 08/28/2019), Disp: 60 tablet, Rfl: 2 .  midodrine (PROAMATINE) 5 MG tablet, Take 1 tablet (5 mg) in the morning after waking up and take 1 tablet (5 mg) in the afternoon after you nap, Disp: 60 tablet, Rfl: 6 .  Multiple Vitamin (MULTI-VITAMINS) TABS, Take 1 tablet by mouth daily. , Disp: , Rfl:  .  Omega-3 Fatty Acids (FISH OIL) 1200 MG  CAPS, Take 1,200 mg by mouth daily., Disp: , Rfl:  .  oxymetazoline (AFRIN) 0.05 % nasal spray, Place 2 sprays into both nostrils at bedtime as needed for congestion., Disp: , Rfl:  .  pantoprazole (PROTONIX) 20 MG tablet, Take 1 tablet (20 mg total) by mouth daily., Disp: 90 tablet, Rfl: 4 .  Probiotic Product ([place]) CAPS, Take by mouth. (Patient not taking: Reported on 08/28/2019), Disp: , Rfl:  .  QUEtiapine Fumarate (SEROQUEL XR) 150 MG 24 hr tablet, TAKE 1 TABLET (150 MG TOTAL) BY MOUTH AT BEDTIME., Disp: 90 tablet, Rfl: 4 .  rivaroxaban (XARELTO) 20 MG TABS tablet, Take 1 tablet (20 mg total) by mouth daily with supper., Disp: 90 tablet, Rfl: 3 .  simvastatin (ZOCOR) 20 MG tablet, Take 1 tablet (20 mg total) by mouth daily., Disp: 90 tablet, Rfl: 4 .  solifenacin (VESICARE) 10 MG tablet, Take 1 tablet (10 mg total) by mouth daily., Disp: 90 tablet, Rfl: 4 .  triamcinolone cream (KENALOG) 0.1 %, Apply 1 application topically 2 (two) times daily. (Patient not taking: Reported on 08/28/2019), Disp: , Rfl:  .  venlafaxine XR (EFFEXOR XR) 75 MG 24 hr capsule, Take 3 capsules (225 mg total) by mouth daily., Disp: 270 capsule, Rfl: 62m  Assessment and Plan: 1. Lumbar radiculopathy   2. Degeneration of lumbar intervertebral disc   3. Chronic, continuous use of opioids   4. Chronic pain syndrome   5. Bilateral hand pain   6. Facet syndrome, lumbar   7. Bilateral sciatica   Based on our discussion today and upon review of the [facility] practitioner database information I am going to refill his medications for August 12 and September 12.  This will be for continued twice daily dosing as he is doing well with this regimen with no side effects reported.  I want him to follow-up with Dr. [name] for possible left hip x-ray to further evaluate for possible osteoarthritis.  We talked about options regarding a possible epidural steroid if this is more radicular type pain.  We will have  him scheduled for return to clinic in 2 months and have encouraged him to continue follow-up with his primary care physicians for his baseline medical care.  We did check a urine drug screen at his last eval and this was appropriate.  Follow Up Instructions:    I discussed the assessment and treatment plan with the patient. The patient was provided an opportunity to ask questions and all were answered. The patient agreed with the plan and demonstrated an understanding of the instructions.   The patient was advised to call back or seek an in-person evaluation if the symptoms worsen or if the condition fails to improve as anticipated.  I provided 30 minutes of non-face-to-face time during  this encounter.   [name], MD

## 2019-09-16 LAB — CUP PACEART REMOTE DEVICE CHECK
Battery Remaining Longevity: 48 mo
Battery Voltage: 2.99 V
Brady Statistic AP VP Percent: 41.64 %
Brady Statistic AP VS Percent: 0 %
Brady Statistic AS VP Percent: 58.34 %
Brady Statistic AS VS Percent: 0.02 %
Brady Statistic RA Percent Paced: 41.63 %
Brady Statistic RV Percent Paced: 99.96 %
Date Time Interrogation Session: 20210823143330
Implantable Lead Implant Date: 20180124
Implantable Lead Implant Date: 20180124
Implantable Lead Location: 753859
Implantable Lead Location: 753860
Implantable Lead Model: 3830
Implantable Lead Model: 5076
Implantable Pulse Generator Implant Date: 20180124
Lead Channel Impedance Value: 342 Ohm
Lead Channel Impedance Value: 342 Ohm
Lead Channel Impedance Value: 437 Ohm
Lead Channel Impedance Value: 475 Ohm
Lead Channel Pacing Threshold Amplitude: 0.625 V
Lead Channel Pacing Threshold Amplitude: 0.75 V
Lead Channel Pacing Threshold Pulse Width: 0.4 ms
Lead Channel Pacing Threshold Pulse Width: 0.4 ms
Lead Channel Sensing Intrinsic Amplitude: 1.5 mV
Lead Channel Sensing Intrinsic Amplitude: 1.5 mV
Lead Channel Sensing Intrinsic Amplitude: 3 mV
Lead Channel Sensing Intrinsic Amplitude: 3 mV
Lead Channel Setting Pacing Amplitude: 2 V
Lead Channel Setting Pacing Amplitude: 2.25 V
Lead Channel Setting Pacing Pulse Width: 1 ms
Lead Channel Setting Sensing Sensitivity: 0.9 mV

## 2019-09-22 NOTE — Progress Notes (Signed)
Remote pacemaker transmission.   

## 2019-09-26 ENCOUNTER — Other Ambulatory Visit: Payer: Self-pay | Admitting: Internal Medicine

## 2019-09-26 NOTE — Telephone Encounter (Signed)
This is a [place] pt 

## 2019-09-30 ENCOUNTER — Other Ambulatory Visit: Payer: Self-pay

## 2019-09-30 ENCOUNTER — Telehealth (INDEPENDENT_AMBULATORY_CARE_PROVIDER_SITE_OTHER): Payer: Medicare Other | Admitting: Internal Medicine

## 2019-09-30 DIAGNOSIS — I951 Orthostatic hypotension: Secondary | ICD-10-CM

## 2019-09-30 DIAGNOSIS — I4819 Other persistent atrial fibrillation: Secondary | ICD-10-CM

## 2019-09-30 DIAGNOSIS — I442 Atrioventricular block, complete: Secondary | ICD-10-CM

## 2019-09-30 DIAGNOSIS — Z950 Presence of cardiac pacemaker: Secondary | ICD-10-CM

## 2019-09-30 NOTE — Progress Notes (Signed)
Pt not available for visit

## 2019-10-06 ENCOUNTER — Other Ambulatory Visit: Payer: Self-pay

## 2019-10-06 DIAGNOSIS — F339 Major depressive disorder, recurrent, unspecified: Secondary | ICD-10-CM

## 2019-10-06 MED ORDER — VENLAFAXINE HCL ER 75 MG PO CP24: 225.0000 mg | ORAL_CAPSULE | Freq: Every day | ORAL | 4 refills | Status: DC

## 2019-10-06 MED ORDER — GABAPENTIN 400 MG PO CAPS
400.0000 mg | ORAL_CAPSULE | Freq: Every day | ORAL | 4 refills | Status: DC
Start: 2019-10-06 — End: 2019-11-10

## 2019-10-06 NOTE — Telephone Encounter (Signed)
[facility]ed [facility] refill request on gabapentin 400 mg and venlafaxine 75 mg

## 2019-10-08 ENCOUNTER — Other Ambulatory Visit: Payer: Self-pay | Admitting: Internal Medicine

## 2019-10-09 ENCOUNTER — Other Ambulatory Visit: Payer: Self-pay | Admitting: Nurse Practitioner

## 2019-10-09 NOTE — Telephone Encounter (Signed)
Routing to provider  

## 2019-10-09 NOTE — Telephone Encounter (Signed)
Requested medication (s) are due for refill today- yes  Requested medication (s) are on the active medication list -yes  Future visit scheduled -yes  Last refill: 07/12/19  Notes to clinic: Request non delegated Rx  Requested Prescriptions  Pending Prescriptions Disp Refills   LORazepam (ATIVAN) 1 MG tablet [Pharmacy Med Name: LORAZEPAM 1 MG TABLET] 90 tablet 0    Sig: Take 1 tablet (1 mg total) by mouth at bedtime.      Not Delegated - Psychiatry:  Anxiolytics/Hypnotics Failed - 10/09/2019  1:49 PM      Failed - This refill cannot be delegated      Failed - Urine Drug Screen completed in last 360 days.      Passed - Valid encounter within last 6 months    Recent Outpatient Visits           3 months ago Depression, recurrent ([place])   [place], [name], NP   6 months ago Depression, recurrent ([place])   [place], Barbaraann Faster, NP   8 months ago [place], Barbaraann Faster, NP   11 months ago Annual physical exam   [place], [name], NP   1 year ago Depression, recurrent [facility])   [place], Jeannette How, MD       Future Appointments             In 1 week Deboraha Sprang, MD [place], [facility]   In 1 month [place], Barbaraann Faster, NP [employer], [facility]   In 1 month [place], Alvina Filbert, MD [place]   In 6 months  [place], [facility]                Requested Prescriptions  Pending Prescriptions Disp Refills   LORazepam (ATIVAN) 1 MG tablet [Pharmacy Med Name: LORAZEPAM 1 MG TABLET] 90 tablet 0    Sig: Take 1 tablet (1 mg total) by mouth at bedtime.      Not Delegated - Psychiatry:  Anxiolytics/Hypnotics Failed - 10/09/2019  1:49 PM      Failed - This refill cannot be delegated      Failed - Urine Drug Screen completed in last 360 days.      Passed - Valid encounter within  last 6 months    Recent Outpatient Visits           3 months ago Depression, recurrent ([place])   [place], [name], NP   6 months ago Depression, recurrent ([place])   [place], Barbaraann Faster, NP   8 months ago [place], Barbaraann Faster, NP   11 months ago Annual physical exam   [place] [name] T, NP   1 year ago Depression, recurrent [facility])   [place] [name], MD       Future Appointments             In 1 week Deboraha Sprang, MD [place], [place]   In 1 month [place], Barbaraann Faster, NP [employer], [place]   In 1 month [place], Alvina Filbert, MD [place]   In 6 months  [place], [facility]

## 2019-10-14 DIAGNOSIS — I480 Paroxysmal atrial fibrillation: Secondary | ICD-10-CM | POA: Insufficient documentation

## 2019-10-14 DIAGNOSIS — Z950 Presence of cardiac pacemaker: Secondary | ICD-10-CM | POA: Insufficient documentation

## 2019-10-16 ENCOUNTER — Telehealth (INDEPENDENT_AMBULATORY_CARE_PROVIDER_SITE_OTHER): Payer: Medicare Other | Admitting: Internal Medicine

## 2019-10-16 ENCOUNTER — Other Ambulatory Visit: Payer: Self-pay

## 2019-10-16 VITALS — Ht 71.0 in | Wt 272.0 lb

## 2019-10-16 DIAGNOSIS — I480 Paroxysmal atrial fibrillation: Secondary | ICD-10-CM | POA: Diagnosis not present

## 2019-10-16 DIAGNOSIS — R079 Chest pain, unspecified: Secondary | ICD-10-CM | POA: Diagnosis not present

## 2019-10-16 DIAGNOSIS — Z950 Presence of cardiac pacemaker: Secondary | ICD-10-CM | POA: Diagnosis not present

## 2019-10-16 DIAGNOSIS — I442 Atrioventricular block, complete: Secondary | ICD-10-CM | POA: Diagnosis not present

## 2019-10-16 DIAGNOSIS — R0600 Dyspnea, unspecified: Secondary | ICD-10-CM

## 2019-10-16 DIAGNOSIS — Z01812 Encounter for preprocedural laboratory examination: Secondary | ICD-10-CM

## 2019-10-16 NOTE — Patient Instructions (Addendum)
Medication Instructions:  Your physician recommends that you continue on your current medications as directed. Please refer to the Current Medication list given to you today.  *If you need a refill on your cardiac medications before your next appointment, please call your pharmacy*   Lab Work: BMET for CTA testing If you have labs (blood work) drawn today and your tests are completely normal, you will receive your results only by: Marland Kitchen MyChart Message (if you have MyChart) OR . A paper copy in the mail If you have any lab test that is abnormal or we need to change your treatment, we will call you to review the results.   Testing/Procedures: Someone will call you to schedule this testing  Your physician has requested that you have cardiac CT. Cardiac computed tomography (CT) is a painless test that uses an x-ray machine to take clear, detailed pictures of your heart. For further information please visit [email or web address]. Please follow instruction sheet as given.  Your physician has requested that you have an echocardiogram. Echocardiography is a painless test that uses sound waves to create images of your heart. It provides your doctor with information about the size and shape of your heart and how well your heart's chambers and valves are working. This procedure takes approximately one hour. There are no restrictions for this procedure.   Follow-Up: At [facility], you and your health needs are our priority.  As part of our continuing mission to provide you with exceptional heart care, we have created designated Provider Care Teams.  These Care Teams include your primary Cardiologist (physician) and Advanced Practice Providers (APPs -  Physician Assistants and Nurse Practitioners) who all work together to provide you with the care you need, when you need it.  We recommend signing up for the patient portal called "MyChart".  Sign up information is provided on this After Visit Summary.   MyChart is used to connect with patients for Virtual Visits (Telemedicine).  Patients are able to view lab/test results, encounter notes, upcoming appointments, etc.  Non-urgent messages can be sent to your provider as well.   To learn more about what you can do with MyChart, go to [email or web address].    Your next appointment:   6 months with Dr Caryl Comes

## 2019-10-16 NOTE — Progress Notes (Signed)
Electrophysiology TeleHealth Note   Due to national recommendations of social distancing due to COVID 19, an audio/video telehealth visit is felt to be most appropriate for this patient at this time.  See MyChart message from today for the patient's consent to telehealth for [facility].   Date:  10/16/2019   ID:  Nicholas [name], DOB Nov 28, 1945, MRN [id number]  Location: patient's home  Provider location: [address] [place]  Evaluation Performed: Follow-up visit  PCP:  Venita Lick, NP  Cardiologist:    Electrophysiologist:  SK   Chief Complaint:    Dyspnea on exertion  History of Present Illness:    Nicholas [name] is a 74 y.o. male who presents via audio/video conferencing for a telehealth visit today.  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   The patient denies nocturnal dyspnea , orthopnea  or peripheral edema .  There have been no palpitations *, lightheadedness  or syncope. Orthostasis not currently an issue--   Weight down  DATE TEST EF   10/15 echo     50 %   Mild RV dysfunction   11/17 echo    60 %    Nl RV function   11/17 Myoview   [facility])          hxstory of pulmonary embolism with repeat venous Dopplers 1/16 demonstrated small clots Managed with chronic apixaban  DATE PR interval QRSduration Dose  3/19 228 126 (HIS pacing) 100  3/21 P-synchronous   pacing  124 ( His pacing) 100        Date Cr Hgb  6/19 1.08 14.6  6/20 1.13 14.7  6/21 1.17<<1.46 15.6     The patient denies symptoms of fevers, chills, cough, or new SOB worrisome for COVID 19.    Past Medical History:  Diagnosis Date  . Anterior urethral stricture   . Anxiety   . Arthritis    a. knees, hips, hands;   b. 11/2013 s/p L TKA @ [employer].  . Bile reflux gastritis   . Bulging lumbar disc   . BXO (balanitis xerotica obliterans)   . 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 Depression   . DVT (deep venous thrombosis) ([place])   . Erosive esophagitis   . Gross hematuria   . Hyperlipemia   . Hypertension    borderline  . Internal hemorrhoids   . Phimosis   . Pulmonary embolism [facility])     Past Surgical History:  Procedure Laterality Date  . BUNIONECTOMY Bilateral 01/06/2015   Procedure: BUNIONECTOMY;  Surgeon: [name], MD;  Location: [facility] ORS;  Service: Orthopedics;  Laterality: Bilateral;  . CARDIAC CATHETERIZATION  ~ 2005   "once"  . CATARACT EXTRACTION W/ INTRAOCULAR LENS  IMPLANT, BILATERAL Bilateral ~ 2010  . COLONOSCOPY WITH PROPOFOL N/A 12/07/2015   Procedure: COLONOSCOPY WITH PROPOFOL;  Surgeon: Lollie Sails, MD;  Location: [facility] ENDOSCOPY;  Service: Endoscopy;  Laterality: N/A;  . EP IMPLANTABLE DEVICE N/A 02/16/2016   MDT dual chamber (His Bundle) pacemaker implanted by Dr Caryl Comes for intermittent complete heart block  . ESOPHAGOGASTRODUODENOSCOPY (EGD) WITH PROPOFOL N/A 12/07/2015   Procedure: ESOPHAGOGASTRODUODENOSCOPY (EGD) WITH PROPOFOL;  Surgeon: Lollie Sails, MD;  Location: [facility] ENDOSCOPY;  Service: Endoscopy;  Laterality: N/A;  . ESOPHAGOGASTRODUODENOSCOPY (EGD) WITH PROPOFOL N/A 05/17/2017   Procedure: ESOPHAGOGASTRODUODENOSCOPY (EGD) WITH PROPOFOL;  Surgeon: Lollie Sails, MD;  Location: [facility] ENDOSCOPY;  Service: Endoscopy;  Laterality: N/A;  . HAMMER TOE SURGERY Bilateral 01/06/2015   Procedure: HAMMER TOE CORRECTION;  Surgeon: [name], MD;  Location: [facility] ORS;  Service: Orthopedics;  Laterality: Bilateral;  . KNEE CARTILAGE SURGERY Left 1965   "football injury"  . Left Total Knee Arthroplasty     a. 11/2013 [facility].  Marland Kitchen PILONIDAL CYST EXCISION  1970's  . TOTAL HIP ARTHROPLASTY Right 2004  . TOTAL HIP  ARTHROPLASTY Left 2006  . UPPER GI ENDOSCOPY      Current Outpatient Medications  Medication Sig Dispense Refill  . acetaminophen (TYLENOL) 650 MG CR tablet Take 650 mg by mouth daily.     Marland Kitchen apixaban (ELIQUIS) 5 MG TABS tablet Take 5 mg by mouth 2 (two) times daily.    Marland Kitchen ascorbic acid (VITAMIN C) 250 MG tablet Take 1,000 mg by mouth daily.     . Cranberry 500 MG CAPS Take 1 capsule by mouth daily.     . Cyanocobalamin (B-12) 1000 MCG CAPS Take 1,000 mcg by mouth daily.     . ferrous sulfate 325 (65 FE) MG tablet Take 325 mg by mouth daily with breakfast.    . flecainide (TAMBOCOR) 100 MG tablet TAKE 1 TABLET BY MOUTH TWICE A DAY 180 tablet 1  . gabapentin (NEURONTIN) 400 MG capsule Take 1 capsule (400 mg total) by mouth daily. 90 capsule 4  . HYDROcodone-acetaminophen (NORCO/VICODIN) 5-325 MG tablet Take 1 tablet by mouth every 6 (six) hours as needed for moderate pain or severe pain. 60 tablet 0  . hyoscyamine (LEVSIN, ANASPAZ) 0.125 MG tablet Take 0.125 mg by mouth every 6 (six) hours as needed for bladder spasms or cramping.     Marland Kitchen LORazepam (ATIVAN) 1 MG tablet TAKE 1 TABLET (1 MG TOTAL) BY MOUTH AT BEDTIME. 90 tablet 0  . Magnesium 400 MG TABS Take 400 mg by mouth daily.     . Melatonin 10 MG TABS Take 10 mg by mouth at bedtime.     . methocarbamol (ROBAXIN) 500 MG tablet Take 1 tablet (500 mg total) by mouth 2 (two) times daily as needed for muscle spasms. 60 tablet 2  . midodrine (PROAMATINE) 5 MG tablet TAKE 1 TABLET (5 MG) IN THE MORNING AFTER WAKING UP AND TAKE 1 TABLET (5 MG) IN THE AFTERNOON AFTER YOU NAP 180 tablet 2  . Multiple Vitamin (MULTI-VITAMINS) TABS Take 1 tablet by mouth daily.     . Omega-3 Fatty Acids (FISH OIL) 1200 MG CAPS Take 1,200 mg by mouth daily.    Marland Kitchen oxymetazoline (AFRIN) 0.05 % nasal spray Place 2 sprays into both nostrils at bedtime as needed for congestion.    . pantoprazole (PROTONIX) 20 MG tablet Take 1 tablet (20 mg total) by mouth daily. 90 tablet 4  .  QUEtiapine Fumarate (SEROQUEL XR) 150 MG 24 hr tablet TAKE 1 TABLET (150 MG TOTAL) BY MOUTH AT BEDTIME. 90 tablet 4  . simvastatin (ZOCOR) 20 MG tablet Take 1 tablet (20 mg total) by mouth daily. 90 tablet 4  . solifenacin (VESICARE) 10 MG tablet Take 1 tablet (10 mg total) by mouth daily. 90 tablet 4  . venlafaxine XR (EFFEXOR XR) 75 MG 24 hr capsule Take 3 capsules (225 mg total) by mouth  daily. 270 capsule 4  . acetaminophen (TYLENOL) 500 MG tablet Take 2,000 mg by mouth 2 (two) times daily as needed. (Patient not taking: Reported on 10/16/2019)    . HYDROcodone-acetaminophen (NORCO/VICODIN) 5-325 MG tablet Take 1 tablet by mouth every 6 (six) hours as needed for moderate pain or severe pain. (Patient not taking: Reported on 10/16/2019) 60 tablet 0  . Probiotic Product (Reform) CAPS Take by mouth. (Patient not taking: Reported on 08/28/2019)    . rivaroxaban (XARELTO) 20 MG TABS tablet Take 1 tablet (20 mg total) by mouth daily with supper. (Patient not taking: Reported on 10/16/2019) 90 tablet 3  . triamcinolone cream (KENALOG) 0.1 % Apply 1 application topically 2 (two) times daily. (Patient not taking: Reported on 08/28/2019)     No current facility-administered medications for this visit.    Allergies:   Patient has no known allergies.   Social History:  The patient  reports that he has never smoked. He has never used smokeless tobacco. He reports current alcohol use. He reports that he does not use drugs.   Family History:  The patient's   family history includes Alcoholism in his mother; Breast cancer in his sister; Cancer in his mother; Diabetes in his father; Glaucoma in his sister; Hypertension in his father; Prostate cancer in his paternal grandfather; Stroke in his father.   ROS:  Please see the history of present illness.   All other systems are personally reviewed and negative.    Exam:    Vital Signs:  Ht 5\' 11"  (1.803 m)   Wt 272 lb (123.4 kg)   BMI 37.94 kg/m       Labs/Other Tests and Data Reviewed:    Recent Labs: 03/25/2019: Hemoglobin 15.6; Platelets 192 07/01/2019: BUN 22; Creatinine, Ser 1.17; Potassium 4.6; Sodium 143   Wt Readings from Last 3 Encounters:  10/16/19 272 lb (123.4 kg)  07/01/19 273 lb 6.4 oz (124 kg)  04/21/19 282 lb (127.9 kg)     Other studies personally reviewed: Additional studies/ records that were reviewed today include:    Last device remote is reviewed from [place] PDF dated 8/21 which reveals normal device function,   arrhythmias - none     ASSESSMENT & PLAN:   His bundle pacing- selective    Obesity  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  Unlikely PM cardiomyopathy as with His pacing QRS d about 125 msec.  Concerning for ischemia, ? Cardiomyopathy, unlikely recurrent PE given anticoagulation  Echo for LV and RV function, the latter to help with ?PE CTA for CAD, with low threshold for cath  CAD would also prompt the need to stop flecainide, currently quiescent.  On Anticoagulation;  No bleeding issues   Orthostasis better -continue proamatine  LDL not at range,  If found to have CAD will need higher dose statin      COVID 19 screen The patient denies symptoms of COVID 19 at this time.  The importance of social distancing was discussed today.  Follow-up:  19m (of following test results) Next remote: As Scheduled   Current medicines are reviewed at length with the patient  today.   The patient does not have concerns regarding his medicines.  The following changes were made today:   none  Labs/ tests ordered today include: CTA ( coronary)--chest pain Echo--dyspnea   No orders of the defined types were placed in this encounter.   Future tests ( post COVID )     Patient Risk:  after full review of this patients clinical status, I feel that they are at moderate risk at this time.  Today, I have spent 12 minutes with the patient with telehealth technology discussing the above.  Signed, Virl Axe, MD  10/16/2019 4:20 PM     [place] [address] [place] [address] [phone] (office) [phone] (fax)

## 2019-10-30 ENCOUNTER — Telehealth (HOSPITAL_COMMUNITY): Payer: Self-pay | Admitting: Emergency Medicine

## 2019-10-30 ENCOUNTER — Other Ambulatory Visit (INDEPENDENT_AMBULATORY_CARE_PROVIDER_SITE_OTHER): Payer: Medicare Other

## 2019-10-30 ENCOUNTER — Other Ambulatory Visit: Payer: Self-pay

## 2019-10-30 DIAGNOSIS — R0600 Dyspnea, unspecified: Secondary | ICD-10-CM

## 2019-10-30 DIAGNOSIS — R079 Chest pain, unspecified: Secondary | ICD-10-CM | POA: Diagnosis not present

## 2019-10-30 DIAGNOSIS — Z01812 Encounter for preprocedural laboratory examination: Secondary | ICD-10-CM

## 2019-10-30 NOTE — Telephone Encounter (Signed)
Attempted to call patient regarding upcoming cardiac CT appointment. °Left message on voicemail with name and callback number °Chick Cousins RN Navigator Cardiac Imaging °[facility] °[phone] Office °[phone] Cell ° °

## 2019-10-30 NOTE — Telephone Encounter (Signed)
pts wife returned phone call- she asked why patient needed labs drawn prior to scan. I informed her that the test involves IV contrast which will be filtered thru the kidneys and wanted to ensure his kidneys were well enough to process the contrast.   I also informed her that he needed to fast 4 hr prior to scan, water only in the AM, no caffeine, and meds per usual.  She appreciated the info and would call back if he had further questions.  Marchia Bond RN Navigator Cardiac Imaging [facility] [phone] Office  [phone] Cell

## 2019-10-31 ENCOUNTER — Ambulatory Visit (HOSPITAL_COMMUNITY)
Admission: RE | Admit: 2019-10-31 | Discharge: 2019-10-31 | Disposition: A | Discharge status: Home / Self Care | Payer: Medicare Other | Source: Ambulatory Visit | Attending: Internal Medicine | Admitting: Internal Medicine

## 2019-10-31 DIAGNOSIS — I700 Atherosclerosis of aorta: Secondary | ICD-10-CM | POA: Diagnosis not present

## 2019-10-31 DIAGNOSIS — R079 Chest pain, unspecified: Secondary | ICD-10-CM | POA: Diagnosis present

## 2019-10-31 DIAGNOSIS — I2510 Atherosclerotic heart disease of native coronary artery without angina pectoris: Secondary | ICD-10-CM | POA: Insufficient documentation

## 2019-10-31 LAB — BASIC METABOLIC PANEL
BUN/Creatinine Ratio: 14 (ref 10–24)
BUN: 15 mg/dL (ref 8–27)
CO2: 27 mmol/L (ref 20–29)
Calcium: 9.9 mg/dL (ref 8.6–10.2)
Chloride: 103 mmol/L (ref 96–106)
Creatinine, Ser: 1.06 mg/dL (ref 0.76–1.27)
GFR calc Af Amer: 80 mL/min/{1.73_m2} (ref >59)
GFR calc non Af Amer: 69 mL/min/{1.73_m2} (ref >59)
Glucose: 136 mg/dL — ABNORMAL HIGH (ref 65–99)
Potassium: 4.7 mmol/L (ref 3.5–5.2)
Sodium: 142 mmol/L (ref 134–144)

## 2019-10-31 MED ORDER — IOHEXOL 350 MG/ML SOLN
80.0000 mL | Freq: Once | INTRAVENOUS | Status: AC | PRN
  Administered 2019-10-31: 80 mL via INTRAVENOUS

## 2019-10-31 MED ORDER — NITROGLYCERIN 0.4 MG SL SUBL
0.8000 mg | SUBLINGUAL_TABLET | Freq: Once | SUBLINGUAL | Status: AC
  Administered 2019-10-31: 0.8 mg via SUBLINGUAL

## 2019-10-31 MED ORDER — NITROGLYCERIN 0.4 MG SL SUBL
SUBLINGUAL_TABLET | SUBLINGUAL | Status: AC
  Filled 2019-10-31: qty 2

## 2019-10-31 MED ORDER — METOPROLOL TARTRATE 5 MG/5ML IV SOLN
5.0000 mg | INTRAVENOUS | Status: DC | PRN
  Administered 2019-10-31: 5 mg via INTRAVENOUS

## 2019-10-31 MED ORDER — METOPROLOL TARTRATE 5 MG/5ML IV SOLN
INTRAVENOUS | Status: AC
  Filled 2019-10-31: qty 15

## 2019-11-01 DIAGNOSIS — I2510 Atherosclerotic heart disease of native coronary artery without angina pectoris: Secondary | ICD-10-CM | POA: Diagnosis not present

## 2019-11-01 DIAGNOSIS — Z6837 Body mass index (BMI) 37.0-37.9, adult: Secondary | ICD-10-CM

## 2019-11-01 DIAGNOSIS — R079 Chest pain, unspecified: Secondary | ICD-10-CM

## 2019-11-04 ENCOUNTER — Encounter: Payer: Medicare Other | Admitting: Nurse Practitioner

## 2019-11-10 ENCOUNTER — Ambulatory Visit (INDEPENDENT_AMBULATORY_CARE_PROVIDER_SITE_OTHER): Payer: Medicare Other | Admitting: Nurse Practitioner

## 2019-11-10 ENCOUNTER — Encounter: Payer: Self-pay | Admitting: Nurse Practitioner

## 2019-11-10 ENCOUNTER — Other Ambulatory Visit: Payer: Self-pay

## 2019-11-10 VITALS — BP 125/85 | HR 87 | Temp 98.0°F | Resp 16 | Ht 72.0 in | Wt 270.0 lb

## 2019-11-10 DIAGNOSIS — N1831 Chronic kidney disease, stage 3a: Secondary | ICD-10-CM

## 2019-11-10 DIAGNOSIS — I10 Essential (primary) hypertension: Secondary | ICD-10-CM

## 2019-11-10 DIAGNOSIS — E785 Hyperlipidemia, unspecified: Secondary | ICD-10-CM

## 2019-11-10 DIAGNOSIS — K219 Gastro-esophageal reflux disease without esophagitis: Secondary | ICD-10-CM

## 2019-11-10 DIAGNOSIS — R7301 Impaired fasting glucose: Secondary | ICD-10-CM

## 2019-11-10 DIAGNOSIS — Z125 Encounter for screening for malignant neoplasm of prostate: Secondary | ICD-10-CM

## 2019-11-10 DIAGNOSIS — I700 Atherosclerosis of aorta: Secondary | ICD-10-CM

## 2019-11-10 DIAGNOSIS — F419 Anxiety disorder, unspecified: Secondary | ICD-10-CM

## 2019-11-10 DIAGNOSIS — G894 Chronic pain syndrome: Secondary | ICD-10-CM

## 2019-11-10 DIAGNOSIS — N400 Enlarged prostate without lower urinary tract symptoms: Secondary | ICD-10-CM

## 2019-11-10 DIAGNOSIS — I480 Paroxysmal atrial fibrillation: Secondary | ICD-10-CM

## 2019-11-10 DIAGNOSIS — I442 Atrioventricular block, complete: Secondary | ICD-10-CM

## 2019-11-10 DIAGNOSIS — Z79899 Other long term (current) drug therapy: Secondary | ICD-10-CM

## 2019-11-10 DIAGNOSIS — F339 Major depressive disorder, recurrent, unspecified: Secondary | ICD-10-CM

## 2019-11-10 DIAGNOSIS — G2581 Restless legs syndrome: Secondary | ICD-10-CM | POA: Insufficient documentation

## 2019-11-10 DIAGNOSIS — E538 Deficiency of other specified B group vitamins: Secondary | ICD-10-CM

## 2019-11-10 DIAGNOSIS — I951 Orthostatic hypotension: Secondary | ICD-10-CM

## 2019-11-10 DIAGNOSIS — Z0000 Encounter for general adult medical examination without abnormal findings: Secondary | ICD-10-CM

## 2019-11-10 MED ORDER — GABAPENTIN 600 MG PO TABS: 600.0000 mg | ORAL_TABLET | Freq: Every day | ORAL | 4 refills | Status: DC

## 2019-11-10 MED ORDER — LORAZEPAM 1 MG PO TABS
1.0000 mg | ORAL_TABLET | Freq: Every day | ORAL | 0 refills | Status: DC
Start: 2020-01-08 — End: 2020-02-11

## 2019-11-10 NOTE — Progress Notes (Addendum)
BP 125/85 (BP Location: Left Arm, Patient Position: Sitting, Cuff Size: Large)   Pulse 87   Temp 98 F (36.7 C) (Oral)   Resp 16   Ht 6' (1.829 m)   Wt 270 lb (122.5 kg)   SpO2 98%   BMI 36.62 kg/m    Subjective:    Patient ID: Nicholas [name], male    DOB: [birth year], 74 y.o.   MRN: [id number]  HPI: KEADEN GUNNOE is a 74 y.o. male presenting on 11/10/2019 for wellness examination. 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    HYPERTENSION / HYPERLIPIDEMIA Followed by Dr. Caryl Comes and last seen 10/16/19.  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  He reports increase in fatigue and difficulty with breathing recently, started around September 9th -- has to sit down when hitting golf balls and could not play 9 holes on own.  For awhile he has been fatigued and SOB -- with taking shower.  Dr. Caryl Comes is aware of these changes and patient is scheduled for echo on Wednesday.   Satisfied with current treatment? yes Duration of hypertension: chronic BP monitoring frequency: daily BP range: 124-136/72-88 BP medication side effects: no Duration of hyperlipidemia: chronic Cholesterol medication side effects: no Cholesterol supplements: fish oil Medication compliance: good compliance Aspirin: no Recent stressors: no Recurrent headaches: no Visual changes: no Palpitations: occasional Dyspnea: present -- with exertion Chest pain: no Lower extremity edema: no Dizzy/lightheaded:once or twice a day -- has been present for a couple years -- notices a lot when first stands up -- not wearing compression hose at home  ATRIAL FIBRILLATION Atrial fibrillation status: stable Satisfied with current treatment: yes  Medication side effects:  no Medication compliance: good compliance Etiology of atrial fibrillation:  Palpitations:  no Chest pain:   no Dyspnea on exertion:  no Orthopnea:  no Syncope:  no Edema:  no Ventricular rate control: Tambocor Anti-coagulation: long acting   GERD Reports is stable on daily Protonix. GERD control status: stable  Satisfied with current treatment? yes Heartburn frequency:  Medication side effects: no  Medication compliance: stable Dysphagia: no Odynophagia:  no Hematemesis: no Blood in stool: no EGD: yes  ANXIETY/STRESS Is on Effexor, Seroquel, and Ativan.  Pt made aware of risks of benzo medication use to include increased sedation, respiratory suppression, falls, extrapyramidal movements, dependence and cardiovascular events.  Pt would like to continue treatment as benefit determined to outweigh risk.  Discussed with him that he is also on opioid therapy, he understands risks with this.  Discussed risks with taking these three medications together at same time -- to avoid doing this.  On PDMP review last Ativan fill 10/09/2019 and last Norco 11/04/19. Duration:stable Anxious mood: yes  Excessive worrying: no Irritability: no  Sweating: no Nausea: no Palpitations:no Hyperventilation: no Panic attacks: no Agoraphobia: no  Obscessions/compulsions: no Depressed mood: no Depression screen [facility] 2/9 11/10/2019 07/01/2019 04/21/2019 03/26/2019 08/14/2018  Decreased Interest 1 0 0 0 0  Down, Depressed, Hopeless 1 0 0 0 0  PHQ - 2 Score 2 0 0 0 0  Altered sleeping 0 0 - 0 1  Tired, decreased energy 1 1 - 1 3  Change in appetite 1 0 - 0 1  Feeling bad or failure about yourself  1 0 - 0 2  Trouble concentrating 0 0 - 0 0  Moving slowly or fidgety/restless 1 0 - 0 0  Suicidal thoughts 0 0 - 0 0  PHQ-9 Score 6 1 - 1 7  Difficult doing work/chores Somewhat difficult Not difficult at all - Not difficult at all Not difficult at all  Some recent data might be hidden   Anhedonia: no Weight changes: no Insomnia: none Hypersomnia: no Fatigue/loss of energy: no Feelings of worthlessness:  no Feelings of guilt: no Impaired concentration/indecisiveness: no Suicidal ideations: no  Crying spells: no Recent Stressors/Life Changes: yes   Relationship problems: no   Family stress: yes     Financial stress: no    Job stress: no    Recent death/loss: no  CHRONIC PAIN: Followed by pain clinic and is on chronic opioid therapy for his back pain.  Last seen 09/15/19 by Dr. Andree Elk.  Takes Norco, one in the morning and one at night.  Reports stable pain control with current regimen and improvement in quality of life.  His wife reports there is still some issues with restless leg, would like to go up on Gabapentin dose.  Functional Status Survey: Is the patient deaf or have difficulty hearing?: Yes Does the patient have difficulty seeing, even when wearing glasses/contacts?: No Does the patient have difficulty concentrating, remembering, or making decisions?: Yes Does the patient have difficulty walking or climbing stairs?: Yes Does the patient have difficulty dressing or bathing?: No Does the patient have difficulty doing errands alone such as visiting a doctor's office or shopping?: No  FALL RISK: Fall Risk  11/10/2019 04/21/2019 04/18/2018 02/25/2018 01/03/2018  Falls in the past year? 0 0 1 0 1  Number falls in past yr: 1 0 0 0 1  Comment - - - - -  Injury with Fall? 0 0 1 0 1  Comment - - hit head, visited ED - -  Risk Factor Category  - - - - -  Risk for fall due to : Impaired balance/gait - - - History of fall(s)  Follow up Falls evaluation completed - - - Falls evaluation completed  Comment - - - - -    Depression Screen Depression screen [facility] 2/9 11/10/2019 07/01/2019 04/21/2019 03/26/2019 08/14/2018  Decreased Interest 1 0 0 0 0  Down, Depressed, Hopeless 1 0 0 0 0  PHQ - 2 Score 2 0 0 0 0  Altered sleeping 0 0 - 0 1  Tired, decreased energy 1 1 - 1 3  Change in appetite 1 0 - 0 1  Feeling bad or failure about yourself  1 0 - 0 2  Trouble concentrating 0 0 - 0 0  Moving  slowly or fidgety/restless 1 0 - 0 0  Suicidal thoughts 0 0 - 0 0  PHQ-9 Score 6 1 - 1 7  Difficult doing work/chores Somewhat difficult Not difficult at all - Not difficult at all Not difficult at all  Some recent data might be hidden    Advanced Directives <no information>  Past Medical History:  Past Medical History:  Diagnosis Date  . Anterior urethral stricture   . Anxiety   . Arthritis    a. knees, hips, hands;  b. 11/2013 s/p L TKA @ [place].  . Bile reflux gastritis   . Bulging lumbar disc   . BXO (balanitis xerotica obliterans)   . 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 Depression   . DVT (deep venous thrombosis) ([place])   .  Erosive esophagitis   . Gross hematuria   . Hyperlipemia   . Hypertension    borderline  . Internal hemorrhoids   . Phimosis   . Pulmonary embolism [facility])     Surgical History:  Past Surgical History:  Procedure Laterality Date  . BUNIONECTOMY Bilateral 01/06/2015   Procedure: BUNIONECTOMY;  Surgeon: [name], MD;  Location: [facility] ORS;  Service: Orthopedics;  Laterality: Bilateral;  . CARDIAC CATHETERIZATION  ~ 2005   "once"  . CATARACT EXTRACTION W/ INTRAOCULAR LENS  IMPLANT, BILATERAL Bilateral ~ 2010  . COLONOSCOPY WITH PROPOFOL N/A 12/07/2015   Procedure: COLONOSCOPY WITH PROPOFOL;  Surgeon: Lollie Sails, MD;  Location: [facility] ENDOSCOPY;  Service: Endoscopy;  Laterality: N/A;  . EP IMPLANTABLE DEVICE N/A 02/16/2016   MDT dual chamber (His Bundle) pacemaker implanted by Dr Caryl Comes for intermittent complete heart block  . ESOPHAGOGASTRODUODENOSCOPY (EGD) WITH PROPOFOL N/A 12/07/2015   Procedure: ESOPHAGOGASTRODUODENOSCOPY (EGD) WITH PROPOFOL;  Surgeon: Lollie Sails, MD;  Location: [facility] ENDOSCOPY;  Service: Endoscopy;  Laterality: N/A;  . ESOPHAGOGASTRODUODENOSCOPY (EGD) WITH PROPOFOL N/A 05/17/2017   Procedure: ESOPHAGOGASTRODUODENOSCOPY (EGD) WITH PROPOFOL;  Surgeon: Lollie Sails, MD;  Location: [facility] ENDOSCOPY;  Service: Endoscopy;  Laterality: N/A;  . HAMMER TOE SURGERY Bilateral 01/06/2015   Procedure: HAMMER TOE CORRECTION;  Surgeon: [name], MD;  Location: [facility] ORS;  Service: Orthopedics;  Laterality: Bilateral;  . KNEE CARTILAGE SURGERY Left 1965   "football injury"  . Left Total Knee Arthroplasty     a. 11/2013 [facility].  Marland Kitchen PILONIDAL CYST EXCISION  1970's  . TOTAL HIP ARTHROPLASTY Right 2004  . TOTAL HIP ARTHROPLASTY Left 2006  . UPPER GI ENDOSCOPY      Medications:  Current Outpatient Medications on File Prior to Visit  Medication Sig  . acetaminophen (TYLENOL) 650 MG CR tablet Take 650 mg by mouth daily.   Marland Kitchen apixaban (ELIQUIS) 5 MG TABS tablet Take 5 mg by mouth 2 (two) times daily.  Marland Kitchen ascorbic acid (VITAMIN C) 250 MG tablet Take 1,000 mg by mouth daily.   . Cranberry 500 MG CAPS Take 1 capsule by mouth daily.   . Cyanocobalamin (B-12) 1000 MCG CAPS Take 1,000 mcg by mouth daily.   . ferrous sulfate 325 (65 FE) MG tablet Take 325 mg by mouth daily with breakfast.  . flecainide (TAMBOCOR) 100 MG tablet TAKE 1 TABLET BY MOUTH TWICE A DAY  . HYDROcodone-acetaminophen (NORCO/VICODIN) 5-325 MG tablet Take 1 tablet by mouth 2 (two) times daily as needed.  . hyoscyamine (LEVSIN, ANASPAZ) 0.125 MG tablet Take 0.125 mg by mouth every 6 (six) hours as needed for bladder spasms or cramping.   Marland Kitchen LORazepam (ATIVAN) 1 MG tablet TAKE 1 TABLET (1 MG TOTAL) BY MOUTH AT BEDTIME.  . Magnesium 400 MG TABS Take 400 mg by mouth daily.   . Melatonin 10 MG TABS Take 10 mg by mouth at bedtime.   . methocarbamol (ROBAXIN) 500 MG tablet Take 1 tablet (500 mg total) by mouth 2 (two) times daily as needed for muscle spasms.  . midodrine (PROAMATINE) 5 MG tablet TAKE 1 TABLET (5 MG) IN THE MORNING AFTER WAKING UP AND TAKE 1 TABLET (5 MG) IN THE AFTERNOON AFTER YOU NAP  . Multiple Vitamin (MULTI-VITAMINS) TABS Take 1 tablet by mouth daily.   . Omega-3 Fatty Acids (FISH  OIL) 1200 MG CAPS Take 1,200 mg by mouth daily.  Marland Kitchen oxymetazoline (AFRIN) 0.05 % nasal spray Place 2 sprays into both nostrils at bedtime  as needed for congestion.  . pantoprazole (PROTONIX) 20 MG tablet Take 1 tablet (20 mg total) by mouth daily.  . QUEtiapine Fumarate (SEROQUEL XR) 150 MG 24 hr tablet TAKE 1 TABLET (150 MG TOTAL) BY MOUTH AT BEDTIME.  . simvastatin (ZOCOR) 20 MG tablet Take 1 tablet (20 mg total) by mouth daily.  . solifenacin (VESICARE) 10 MG tablet Take 1 tablet (10 mg total) by mouth daily.  Marland Kitchen venlafaxine XR (EFFEXOR XR) 75 MG 24 hr capsule Take 3 capsules (225 mg total) by mouth daily.  . Probiotic Product ([place]on[place]) CAPS Take by mouth.  (Patient not taking: Reported on 11/10/2019)  . triamcinolone cream (KENALOG) 0.1 % Apply 1 application topically 2 (two) times daily.  (Patient not taking: Reported on 11/10/2019)   No current facility-administered medications on file prior to visit.    Allergies:  No Known Allergies  Social History:  Social History   Socioeconomic History  . Marital status: Married    Spouse name: Not on file  . Number of children: Not on file  . Years of education: Not on file  . Highest education level: High school graduate  Occupational History  . Occupation: retired   Tobacco Use  . Smoking status: Never Smoker  . Smokeless tobacco: Never Used  Vaping Use  . Vaping Use: Never used  Substance and Sexual Activity  . Alcohol use: Yes    Alcohol/week: 0.0 standard drinks    Comment: beers seldom   . Drug use: No  . Sexual activity: Not Currently  Other Topics Concern  . Not on file  Social History Narrative   Lives in [place] with wife.  Does not routinely exercise.  Activity severely limited by bilateral knee pain.   Social Determinants of Health   Financial Resource Strain: Medium Risk  . Difficulty of Paying Living Expenses: Somewhat hard  Food Insecurity: No Food Insecurity  . Worried About Charity fundraiser in  the Last Year: Never true  . Ran Out of Food in the Last Year: Never true  Transportation Needs: No Transportation Needs  . Lack of Transportation (Medical): No  . Lack of Transportation (Non-Medical): No  Physical Activity: Inactive  . Days of Exercise per Week: 0 days  . Minutes of Exercise per Session: 0 min  Stress:   . Feeling of Stress : Not on file  Social Connections: Moderately Integrated  . Frequency of Communication with Friends and Family: More than three times a week  . Frequency of Social Gatherings with Friends and Family: More than three times a week  . Attends Religious Services: More than 4 times per year  . Active Member of Clubs or Organizations: No  . Attends Archivist Meetings: Never  . Marital Status: Married  Human resources officer Violence:   . Fear of Current or Ex-Partner: Not on file  . Emotionally Abused: Not on file  . Physically Abused: Not on file  . Sexually Abused: Not on file   Social History   Tobacco Use  Smoking Status Never Smoker  Smokeless Tobacco Never Used   Social History   Substance and Sexual Activity  Alcohol Use Yes  . Alcohol/week: 0.0 standard drinks   Comment: beers seldom     Family History:  Family History  Problem Relation Age of Onset  . Stroke Father        deceased  . Diabetes Father   . Hypertension Father   . Alcoholism Mother  died in her 53's.  . Cancer Mother   . Glaucoma Sister   . Breast cancer Sister   . Prostate cancer Paternal Grandfather     Past medical history, surgical history, medications, allergies, family history and social history reviewed with patient today and changes made to appropriate areas of the chart.   Review of Systems - negative All other ROS negative except what is listed above and in the HPI.      Objective:    BP 125/85 (BP Location: Left Arm, Patient Position: Sitting, Cuff Size: Large)   Pulse 87   Temp 98 F (36.7 C) (Oral)   Resp 16   Ht 6' (1.829 m)    Wt 270 lb (122.5 kg)   SpO2 98%   BMI 36.62 kg/m   Wt Readings from Last 3 Encounters:  11/10/19 270 lb (122.5 kg)  10/16/19 272 lb (123.4 kg)  07/01/19 273 lb 6.4 oz (124 kg)    Physical Exam Vitals and nursing note reviewed.  Constitutional:      General: He is awake. He is not in acute distress.    Appearance: He is well-developed. He is obese. He is not ill-appearing.  HENT:     Head: Normocephalic and atraumatic.     Right Ear: Hearing, tympanic membrane, ear canal and external ear normal. No drainage.     Left Ear: Hearing, tympanic membrane, ear canal and external ear normal. No drainage.     Nose: Nose normal.     Mouth/Throat:     Mouth: Mucous membranes are moist.     Pharynx: Oropharynx is clear. Uvula midline.  Eyes:     General: Lids are normal.        Right eye: No discharge.        Left eye: No discharge.     Extraocular Movements: Extraocular movements intact.     Conjunctiva/sclera: Conjunctivae normal.     Pupils: Pupils are equal, round, and reactive to light.     Visual Fields: Right eye visual fields normal and left eye visual fields normal.  Neck:     Thyroid: No thyromegaly.     Vascular: No carotid bruit.  Cardiovascular:     Rate and Rhythm: Normal rate and regular rhythm.     Heart sounds: Normal heart sounds, S1 normal and S2 normal. No murmur heard.  No gallop.   Pulmonary:     Effort: Pulmonary effort is normal. No accessory muscle usage or respiratory distress.     Breath sounds: Normal breath sounds.  Abdominal:     General: Bowel sounds are normal.     Palpations: Abdomen is soft. There is no hepatomegaly or splenomegaly.     Tenderness: There is no abdominal tenderness.     Hernia: There is no hernia in the left inguinal area or right inguinal area.  Genitourinary:    Penis: Normal.      Testes: Normal.     Prostate: Normal.     Rectum: Normal.  Musculoskeletal:        General: Normal range of motion.     Cervical back: Normal  range of motion and neck supple.     Right lower leg: No edema.     Left lower leg: No edema.  Lymphadenopathy:     Cervical: No cervical adenopathy.  Skin:    General: Skin is warm and dry.     Capillary Refill: Capillary refill takes less than 2 seconds.     Findings:  No rash.  Neurological:     Mental Status: He is alert and oriented to person, place, and time.     Cranial Nerves: Cranial nerves are intact.     Gait: Gait is intact.     Deep Tendon Reflexes: Reflexes are normal and symmetric.  Psychiatric:        Attention and Perception: Attention normal.        Mood and Affect: Mood normal.        Speech: Speech normal.        Behavior: Behavior normal. Behavior is cooperative.        Thought Content: Thought content normal.        Judgment: Judgment normal.     Results for orders placed or performed in visit on 19/37/90  Basic metabolic panel  Result Value Ref Range   Glucose 136 (H) 65 - 99 mg/dL   BUN 15 8 - 27 mg/dL   Creatinine, Ser 1.06 0.76 - 1.27 mg/dL   GFR calc non Af Amer 69 >59 mL/min/1.73   GFR calc Af Amer 80 >59 mL/min/1.73   BUN/Creatinine Ratio 14 10 - 24   Sodium 142 134 - 144 mmol/L   Potassium 4.7 3.5 - 5.2 mmol/L   Chloride 103 96 - 106 mmol/L   CO2 27 20 - 29 mmol/L   Calcium 9.9 8.6 - 10.2 mg/dL      Assessment & Plan:   Problem List Items Addressed This Visit      Cardiovascular and Mediastinum   HTN (hypertension) (Chronic)    Chronic, ongoing.  Followed by cardiology.  BP at goal today in office.  With orthostatic BP presenting occasionally will continue current regimen at this time and have recommended utilizing compression hose at home, agrees to try this.  Continue collaboration with cardiology and current medication regimen.  Labs today to include CBC, CMP, TSH.      Relevant Orders   CBC with Differential/Platelet   Comprehensive metabolic panel   TSH   Chronic orthostatic hypotension    Continue collaboration with cardiology  and medication regimen as prescribed by them.  Recommend ensuring good hydration throughout daytime hours + use of compression hose at home (on during daytime and off during night), agrees to try them.      Abdominal aortic atherosclerosis ([place]) - Primary    Noted on past imaging, continue statin and BP control for prevention.      Complete heart block ([place])    Followed by cardiology.  Pacemaker checks by them.       PAF (paroxysmal atrial fibrillation) (HCC)    Chronic, stable, followed by cardiology.  Continue current medication regimen as prescribed by them.  Labs today.        Digestive   GERD (gastroesophageal reflux disease) (Chronic)    Chronic, stable on Protonix.  Continue current medication regimen and check Mag level annually.      Relevant Orders   Magnesium     Endocrine   IFG (impaired fasting glucose)    Noted on past labs -- check A1C today.  Concern for T2DM and discussed with wife and patient.  Initiate medication as needed and continue to collaborate with CCM team.        Relevant Orders   HgB A1c     Genitourinary   BPH (benign prostatic hyperplasia)    Chronic, stable with no current symptoms.  Check PSA today per request.      Stage 3a chronic kidney  disease [facility])    Recent labs improved noting no CKD, recheck CMP today and adjust medications as needed.        Other   Dyslipidemia (Chronic)    Chronic, ongoing.  Continue current medication regimen and adjust as needed. Lipid panel today.      Relevant Orders   Comprehensive metabolic panel   Lipid Panel w/o Chol/HDL Ratio   Depression, recurrent (HCC)    Chronic, ongoing.  Denies SI/HI.  Continue current medication regimen and adjust as needed.  Would benefit from trial off Effexor and trial of SSRI, but refuses this.        Anxiety    Chronic, ongoing.  Discussed at length risk of benzo and opioid use in conjunction with each other.  Recommend he not take the two together at same hour  during day or evening.  He tried to cut back to 1/2 tablet but was unsuccessful.   Continue to collaborate with CCM team on education.  Provided wife and him with copy of [facility] ben[facility] risk information patient sheet last visit.  They request 90 day supply, as previous PCP supplied this.  Are aware he does have to return every 3 months for refills.  Obtain UDS and contract next visit.  Refills sent.      Chronic pain syndrome    Chronic, ongoing followed by pain clinic.  Discussed at length risk of benzo and opioid use in conjunction with each other.  Recommend he not take the two together at same hour during day or evening.        Relevant Medications   gabapentin (NEURONTIN) 600 MG tablet   HYDROcodone-acetaminophen (NORCO/VICODIN) 5-325 MG tablet   Long-term current use of benzodiazepine    Has been on Ativan for many years.  At length discussions about risk of opioid and benzo use had with patient and wife by both PCP and CCM PharmD.  Continue these discussion.  Refer to anxiety plan.      Morbid obesity (HCC)    BMI 36.62 with HTN, A-Fib, Heart Block, and CKD.  Recommended eating smaller high protein, low fat meals more frequently and exercising 30 mins a day 5 times a week with a goal of 10-15lb weight loss in the next 3 months. Patient voiced their understanding and motivation to adhere to these recommendations.       Restless leg syndrome    Chronic, ongoing.  Will increase Gabapentin to 600 MG QHS since still some mild symptoms present.  Further adjust as needed.  Labs today.       Other Visit Diagnoses    B12 deficiency       Continue supplement and check B12 level today.   Relevant Orders   CBC with Differential/Platelet   B12   Prostate cancer screening       PSA on labs today   Relevant Orders   PSA   Routine general medical examination at a health care facility       Annual labs today to include CBC, CMP, TSH, lipid, PSA      Discussed aspirin prophylaxis for myocardial  infarction prevention and decision was it was not indicated  LABORATORY TESTING:  Health maintenance labs ordered today as discussed above.   The natural history of prostate cancer and ongoing controversy regarding screening and potential treatment outcomes of prostate cancer has been discussed with the patient. The meaning of a false positive PSA and a false negative PSA has been discussed. He indicates  understanding of the limitations of this screening test and wishes to proceed with screening PSA testing. Recently had labs.   IMMUNIZATIONS:   - Tdap: Tetanus vaccination status reviewed: last tetanus booster within 10 years. - Influenza: Up to date - Pneumovax: Up to date - Prevnar: Up to date - Zostavax vaccine: Refused  SCREENING: - Colonoscopy: Up to date  Discussed with patient purpose of the colonoscopy is to detect colon cancer at curable precancerous or early stages   - AAA Screening: Not applicable  -Hearing Test: Not applicable  -Spirometry: Not applicable   PATIENT COUNSELING:    Sexuality: Discussed sexually transmitted diseases, partner selection, use of condoms, avoidance of unintended pregnancy  and contraceptive alternatives.   Advised to avoid cigarette smoking.  I discussed with the patient that most people either abstain from alcohol or drink within safe limits (<=14/week and <=4 drinks/occasion for males, <=7/weeks and <= 3 drinks/occasion for females) and that the risk for alcohol disorders and other health effects rises proportionally with the number of drinks per week and how often a drinker exceeds daily limits.  Discussed cessation/primary prevention of drug use and availability of treatment for abuse.   Diet: Encouraged to adjust caloric intake to maintain  or achieve ideal body weight, to reduce intake of dietary saturated fat and total fat, to limit sodium intake by avoiding high sodium foods and not adding table salt, and to maintain adequate dietary  potassium and calcium preferably from fresh fruits, vegetables, and low-fat dairy products.    stressed the importance of regular exercise  Injury prevention: Discussed safety belts, safety helmets, smoke detector, smoking near bedding or upholstery.   Dental health: Discussed importance of regular tooth brushing, flossing, and dental visits.   Follow up plan: NEXT PREVENTATIVE PHYSICAL DUE IN 1 YEAR. Return in about 3 months (around 02/10/2020) for MOOD, HTN/HLD, IFG.

## 2019-11-10 NOTE — Assessment & Plan Note (Signed)
Chronic, ongoing followed by pain clinic.  Discussed at length risk of benzo and opioid use in conjunction with each other.  Recommend he not take the two together at same hour during day or evening.

## 2019-11-10 NOTE — Assessment & Plan Note (Signed)
Has been on Ativan for many years.  At length discussions about risk of opioid and benzo use had with patient and wife by both PCP and CCM PharmD.  Continue these discussion.  Refer to anxiety plan.

## 2019-11-10 NOTE — Addendum Note (Signed)
Addended by: [name] T on: 11/10/2019 10:10 AM   Modules accepted: Orders

## 2019-11-10 NOTE — Assessment & Plan Note (Signed)
Chronic, ongoing.  Followed by cardiology.  BP at goal today in office.  With orthostatic BP presenting occasionally will continue current regimen at this time and have recommended utilizing compression hose at home, agrees to try this.  Continue collaboration with cardiology and current medication regimen.  Labs today to include CBC, CMP, TSH.

## 2019-11-10 NOTE — Assessment & Plan Note (Signed)
BMI 36.62 with HTN, A-Fib, Heart Block, and CKD.  Recommended eating smaller high protein, low fat meals more frequently and exercising 30 mins a day 5 times a week with a goal of 10-15lb weight loss in the next 3 months. Patient voiced their understanding and motivation to adhere to these recommendations.

## 2019-11-10 NOTE — Assessment & Plan Note (Signed)
Chronic, ongoing.  Discussed at length risk of benzo and opioid use in conjunction with each other.  Recommend he not take the two together at same hour during day or evening.  He tried to cut back to 1/2 tablet but was unsuccessful.   Continue to collaborate with CCM team on education.  Provided wife and him with copy of [facility] ben[facility] risk information patient sheet last visit.  They request 90 day supply, as previous PCP supplied this.  Are aware he does have to return every 3 months for refills.  Obtain UDS and contract next visit.  Refills sent.

## 2019-11-10 NOTE — Assessment & Plan Note (Signed)
Chronic, ongoing.  Will increase Gabapentin to 600 MG QHS since still some mild symptoms present.  Further adjust as needed.  Labs today.

## 2019-11-10 NOTE — Assessment & Plan Note (Signed)
Chronic, stable with no current symptoms.  Check PSA today per request.

## 2019-11-10 NOTE — Patient Instructions (Signed)
Shortness of Breath, Adult Shortness of breath means you have trouble breathing. Shortness of breath could be a sign of a medical problem. Follow these instructions at home:   Watch for any changes in your symptoms.  Do not use any products that contain nicotine or tobacco, such as cigarettes, e-cigarettes, and chewing tobacco.  Do not smoke. Smoking can cause shortness of breath. If you need help to quit smoking, ask your doctor.  Avoid things that can make it harder to breathe, such as: ? Mold. ? Dust. ? Air pollution. ? Chemical smells. ? Things that can cause allergy symptoms (allergens), if you have allergies.  Keep your living space clean. Use products that help remove mold and dust.  Rest as needed. Slowly return to your normal activities.  Take over-the-counter and prescription medicines only as told by your doctor. This includes oxygen therapy and inhaled medicines.  Keep all follow-up visits as told by your doctor. This is important. Contact a doctor if:  Your condition does not get better as soon as expected.  You have a hard time doing your normal activities, even after you rest.  You have new symptoms. Get help right away if:  Your shortness of breath gets worse.  You have trouble breathing when you are resting.  You feel light-headed or you pass out (faint).  You have a cough that is not helped by medicines.  You cough up blood.  You have pain with breathing.  You have pain in your chest, arms, shoulders, or belly (abdomen).  You have a fever.  You cannot walk up stairs.  You cannot exercise the way you normally do. These symptoms may represent a serious problem that is an emergency. Do not wait to see if the symptoms will go away. Get medical help right away. Call your local emergency services (911 in the [place] Do not drive yourself to the hospital. Summary  Shortness of breath is when you have trouble breathing enough air. It can be a sign of a  medical problem.  Avoid things that make it hard for you to breathe, such as smoking, pollution, mold, and dust.  Watch for any changes in your symptoms. Contact your doctor if you do not get better or you get worse. This information is not intended to replace advice given to you by your health care provider. Make sure you discuss any questions you have with your health care provider. Document Revised: 06/11/2017 Document Reviewed: 06/11/2017 Elsevier Patient Education  2020 [employer]  

## 2019-11-10 NOTE — Assessment & Plan Note (Signed)
Chronic, ongoing.  Continue current medication regimen and adjust as needed. Lipid panel today. 

## 2019-11-10 NOTE — Assessment & Plan Note (Signed)
Recent labs improved noting no CKD, recheck CMP today and adjust medications as needed.

## 2019-11-10 NOTE — Assessment & Plan Note (Signed)
Continue collaboration with cardiology and medication regimen as prescribed by them.  Recommend ensuring good hydration throughout daytime hours + use of compression hose at home (on during daytime and off during night), agrees to try them.

## 2019-11-10 NOTE — Assessment & Plan Note (Signed)
Noted on past labs -- check A1C today.  Concern for T2DM and discussed with wife and patient.  Initiate medication as needed and continue to collaborate with CCM team.

## 2019-11-10 NOTE — Assessment & Plan Note (Signed)
Chronic, stable, followed by cardiology.  Continue current medication regimen as prescribed by them.  Labs today.

## 2019-11-10 NOTE — Assessment & Plan Note (Signed)
Chronic, stable on Protonix.  Continue current medication regimen and check Mag level annually. 

## 2019-11-10 NOTE — Assessment & Plan Note (Signed)
Chronic, ongoing.  Denies SI/HI.  Continue current medication regimen and adjust as needed.  Would benefit from trial off Effexor and trial of SSRI, but refuses this.   

## 2019-11-10 NOTE — Assessment & Plan Note (Signed)
Followed by cardiology.  Pacemaker checks by them.

## 2019-11-10 NOTE — Assessment & Plan Note (Signed)
Noted on past imaging, continue statin and BP control for prevention. 

## 2019-11-11 ENCOUNTER — Ambulatory Visit: Payer: Medicare Other | Attending: Anesthesiology | Admitting: Anesthesiology

## 2019-11-11 ENCOUNTER — Encounter: Payer: Self-pay | Admitting: Anesthesiology

## 2019-11-11 DIAGNOSIS — M5431 Sciatica, right side: Secondary | ICD-10-CM

## 2019-11-11 DIAGNOSIS — M79641 Pain in right hand: Secondary | ICD-10-CM

## 2019-11-11 DIAGNOSIS — M5136 Other intervertebral disc degeneration, lumbar region: Secondary | ICD-10-CM

## 2019-11-11 DIAGNOSIS — G894 Chronic pain syndrome: Secondary | ICD-10-CM

## 2019-11-11 DIAGNOSIS — M47816 Spondylosis without myelopathy or radiculopathy, lumbar region: Secondary | ICD-10-CM

## 2019-11-11 DIAGNOSIS — F1190 Opioid use, unspecified, uncomplicated: Secondary | ICD-10-CM | POA: Diagnosis not present

## 2019-11-11 DIAGNOSIS — M1611 Unilateral primary osteoarthritis, right hip: Secondary | ICD-10-CM

## 2019-11-11 DIAGNOSIS — M5416 Radiculopathy, lumbar region: Secondary | ICD-10-CM

## 2019-11-11 DIAGNOSIS — M79642 Pain in left hand: Secondary | ICD-10-CM

## 2019-11-11 DIAGNOSIS — M5432 Sciatica, left side: Secondary | ICD-10-CM

## 2019-11-11 LAB — CBC WITH DIFFERENTIAL/PLATELET
Basophils Absolute: 0.1 10*3/uL (ref 0.0–0.2)
Basos: 1 %
EOS (ABSOLUTE): 0.2 10*3/uL (ref 0.0–0.4)
Eos: 2 %
Hematocrit: 47.1 % (ref 37.5–51.0)
Hemoglobin: 15.9 g/dL (ref 13.0–17.7)
Immature Grans (Abs): 0.1 10*3/uL (ref 0.0–0.1)
Immature Granulocytes: 1 %
Lymphocytes Absolute: 1.3 10*3/uL (ref 0.7–3.1)
Lymphs: 19 %
MCH: 31.5 pg (ref 26.6–33.0)
MCHC: 33.8 g/dL (ref 31.5–35.7)
MCV: 94 fL (ref 79–97)
Monocytes Absolute: 0.6 10*3/uL (ref 0.1–0.9)
Monocytes: 9 %
Neutrophils Absolute: 4.6 10*3/uL (ref 1.4–7.0)
Neutrophils: 68 %
Platelets: 205 10*3/uL (ref 150–450)
RBC: 5.04 x10E6/uL (ref 4.14–5.80)
RDW: 12.6 % (ref 11.6–15.4)
WBC: 6.8 10*3/uL (ref 3.4–10.8)

## 2019-11-11 LAB — COMPREHENSIVE METABOLIC PANEL
ALT: 20 IU/L (ref 0–44)
AST: 22 IU/L (ref 0–40)
Albumin/Globulin Ratio: 1.8 (ref 1.2–2.2)
Albumin: 4.6 g/dL (ref 3.7–4.7)
Alkaline Phosphatase: 64 IU/L (ref 44–121)
BUN/Creatinine Ratio: 12 (ref 10–24)
BUN: 14 mg/dL (ref 8–27)
Bilirubin Total: 0.7 mg/dL (ref 0.0–1.2)
CO2: 25 mmol/L (ref 20–29)
Calcium: 9.7 mg/dL (ref 8.6–10.2)
Chloride: 99 mmol/L (ref 96–106)
Creatinine, Ser: 1.2 mg/dL (ref 0.76–1.27)
GFR calc Af Amer: 68 mL/min/{1.73_m2} (ref >59)
GFR calc non Af Amer: 59 mL/min/{1.73_m2} — ABNORMAL LOW (ref >59)
Globulin, Total: 2.5 g/dL (ref 1.5–4.5)
Glucose: 170 mg/dL — ABNORMAL HIGH (ref 65–99)
Potassium: 4.9 mmol/L (ref 3.5–5.2)
Sodium: 139 mmol/L (ref 134–144)
Total Protein: 7.1 g/dL (ref 6.0–8.5)

## 2019-11-11 LAB — LIPID PANEL W/O CHOL/HDL RATIO
Cholesterol, Total: 192 mg/dL (ref 100–199)
HDL: 48 mg/dL (ref >39)
LDL Chol Calc (NIH): 106 mg/dL — ABNORMAL HIGH (ref 0–99)
Triglycerides: 222 mg/dL — ABNORMAL HIGH (ref 0–149)
VLDL Cholesterol Cal: 38 mg/dL (ref 5–40)

## 2019-11-11 LAB — HEMOGLOBIN A1C
Est. average glucose Bld gHb Est-mCnc: 134 mg/dL
Hgb A1c MFr Bld: 6.3 % — ABNORMAL HIGH (ref 4.8–5.6)

## 2019-11-11 LAB — VITAMIN B12: Vitamin B-12: >2000 pg/mL — ABNORMAL HIGH (ref 232–1245)

## 2019-11-11 LAB — TSH: TSH: 4.14 u[IU]/mL (ref 0.450–4.500)

## 2019-11-11 LAB — PSA: Prostate Specific Ag, Serum: 5 ng/mL — ABNORMAL HIGH (ref 0.0–4.0)

## 2019-11-11 LAB — MAGNESIUM: Magnesium: 2.3 mg/dL (ref 1.6–2.3)

## 2019-11-11 MED ORDER — HYDROCODONE-ACETAMINOPHEN 5-325 MG PO TABS
1.0000 | ORAL_TABLET | Freq: Two times a day (BID) | ORAL | 0 refills | Status: DC | PRN
Start: 2020-01-03 — End: 2020-02-02

## 2019-11-11 MED ORDER — HYDROCODONE-ACETAMINOPHEN 5-325 MG PO TABS: 1.0000 | ORAL_TABLET | Freq: Two times a day (BID) | ORAL | 0 refills | Status: DC | PRN

## 2019-11-11 NOTE — Progress Notes (Signed)
Contacted via [place], also spoke to patient on phone. Good afternoon [name], here are the labs we chatted about: - still showing that prediabetic A1C at 6.3%, we will continue to monitor with goal for this not to creep up to 6.5% or greater. - PSA shows level of 5, normal prostate lab for your ethnicity and age is 0 to 6.5, so normal range for you but slightly higher than last check -- we can recheck next visit - Cholesterol levels a little more elevated this check, please make sure you are taking your cholesterol medicine daily (Simvastatin) Any questions? Keep being awesome!!  Thank you for allowing me to participate in your care. Kindest regards, Dontreal Miera

## 2019-11-12 ENCOUNTER — Ambulatory Visit (HOSPITAL_COMMUNITY): Payer: Medicare Other | Attending: Cardiology

## 2019-11-12 ENCOUNTER — Other Ambulatory Visit: Payer: Self-pay

## 2019-11-12 DIAGNOSIS — R0600 Dyspnea, unspecified: Secondary | ICD-10-CM | POA: Diagnosis not present

## 2019-11-12 DIAGNOSIS — R079 Chest pain, unspecified: Secondary | ICD-10-CM | POA: Diagnosis not present

## 2019-11-12 LAB — ECHOCARDIOGRAM COMPLETE
Area-P 1/2: 2 cm2
S' Lateral: 4.6 cm

## 2019-11-12 NOTE — Progress Notes (Signed)
Virtual Visit via Telephone Note  I connected with Nicholas [name] on 11/12/19 at  2:45 PM EDT by telephone and verified that I am speaking with the correct person using two identifiers.  Location: Patient: Home Provider: [facility] control center   I discussed the limitations, risks, security and privacy concerns of performing an evaluation and management service by telephone and the availability of in person appointments. I also discussed with the patient that there may be a patient responsible charge related to this service. The patient expressed understanding and agreed to proceed.   History of Present Illness: I spoke with Nicholas [name] today via telephone as he was unable to do the video portion of the virtual conference but he reports that his low back pain has been stable.  He is taking his medications as prescribed twice a day and these enable him to stay active.  He still try to play golf as frequently as possible.  He does report that he has had a recent problem with his energy level and this is currently under evaluation with his primary care physicians.  He is undergoing a work-up there.  No changes in his lower extremity strength or function or bowel bladder function are noted.  He denies any side effects with the Vicodin.  When he takes the medication he gets good relief this generally last 4 to 6 hours and he reserves these mainly for when he is having more severe pain.  The pain is worse with activity and prolonged standing.  He is continuing ongoing efforts at weight loss and strengthening as tolerated.    Observations/Objective:  Current Outpatient Medications:  .  acetaminophen (TYLENOL) 650 MG CR tablet, Take 650 mg by mouth daily. , Disp: , Rfl:  .  apixaban (ELIQUIS) 5 MG TABS tablet, Take 5 mg by mouth 2 (two) times daily., Disp: , Rfl:  .  ascorbic acid (VITAMIN C) 250 MG tablet, Take 1,000 mg by mouth daily. , Disp: , Rfl:  .  Cranberry 500 MG CAPS, Take 1 capsule by mouth daily. ,  Disp: , Rfl:  .  Cyanocobalamin (B-12) 1000 MCG CAPS, Take 1,000 mcg by mouth daily. , Disp: , Rfl:  .  ferrous sulfate 325 (65 FE) MG tablet, Take 325 mg by mouth daily with breakfast., Disp: , Rfl:  .  flecainide (TAMBOCOR) 100 MG tablet, TAKE 1 TABLET BY MOUTH TWICE A DAY, Disp: 180 tablet, Rfl: 1 .  gabapentin (NEURONTIN) 600 MG tablet, Take 1 tablet (600 mg total) by mouth at bedtime., Disp: 90 tablet, Rfl: 4 .  [START ON 01/03/2020] HYDROcodone-acetaminophen (NORCO/VICODIN) 5-325 MG tablet, Take 1 tablet by mouth 2 (two) times daily as needed., Disp: 60 tablet, Rfl: 0 .  [START ON 12/03/2020] HYDROcodone-acetaminophen (NORCO/VICODIN) 5-325 MG tablet, Take 1 tablet by mouth 2 (two) times daily as needed for moderate pain or severe pain., Disp: 60 tablet, Rfl: 0 .  hyoscyamine (LEVSIN, ANASPAZ) 0.125 MG tablet, Take 0.125 mg by mouth every 6 (six) hours as needed for bladder spasms or cramping. , Disp: , Rfl:  .  [START ON 01/08/2020] LORazepam (ATIVAN) 1 MG tablet, Take 1 tablet (1 mg total) by mouth at bedtime., Disp: 90 tablet, Rfl: 0 .  Magnesium 400 MG TABS, Take 400 mg by mouth daily. , Disp: , Rfl:  .  Melatonin 10 MG TABS, Take 10 mg by mouth at bedtime. , Disp: , Rfl:  .  methocarbamol (ROBAXIN) 500 MG tablet, Take 1 tablet (500 mg total)  by mouth 2 (two) times daily as needed for muscle spasms., Disp: 60 tablet, Rfl: 2 .  midodrine (PROAMATINE) 5 MG tablet, TAKE 1 TABLET (5 MG) IN THE MORNING AFTER WAKING UP AND TAKE 1 TABLET (5 MG) IN THE AFTERNOON AFTER YOU NAP, Disp: 180 tablet, Rfl: 2 .  Multiple Vitamin (MULTI-VITAMINS) TABS, Take 1 tablet by mouth daily. , Disp: , Rfl:  .  Omega-3 Fatty Acids (FISH OIL) 1200 MG CAPS, Take 1,200 mg by mouth daily., Disp: , Rfl:  .  oxymetazoline (AFRIN) 0.05 % nasal spray, Place 2 sprays into both nostrils at bedtime as needed for congestion., Disp: , Rfl:  .  pantoprazole (PROTONIX) 20 MG tablet, Take 1 tablet (20 mg total) by mouth daily., Disp:  90 tablet, Rfl: 4 .  Probiotic Product ([place]) CAPS, Take by mouth.  (Patient not taking: Reported on 11/10/2019), Disp: , Rfl:  .  QUEtiapine Fumarate (SEROQUEL XR) 150 MG 24 hr tablet, TAKE 1 TABLET (150 MG TOTAL) BY MOUTH AT BEDTIME., Disp: 90 tablet, Rfl: 4 .  simvastatin (ZOCOR) 20 MG tablet, Take 1 tablet (20 mg total) by mouth daily., Disp: 90 tablet, Rfl: 4 .  solifenacin (VESICARE) 10 MG tablet, Take 1 tablet (10 mg total) by mouth daily., Disp: 90 tablet, Rfl: 4 .  triamcinolone cream (KENALOG) 0.1 %, Apply 1 application topically 2 (two) times daily.  (Patient not taking: Reported on 11/10/2019), Disp: , Rfl:  .  venlafaxine XR (EFFEXOR XR) 75 MG 24 hr capsule, Take 3 capsules (225 mg total) by mouth daily., Disp: 270 capsule, Rfl: 4  Assessment and Plan:  1. Lumbar radiculopathy   2. Degeneration of lumbar intervertebral disc   3. Chronic, continuous use of opioids   4. Chronic pain syndrome   5. Bilateral hand pain   6. Facet syndrome, lumbar   7. Bilateral sciatica   8. Osteoarthritis of right hip, unspecified osteoarthritis type   Based on our discussion today and upon review of the [place] practitioner database information going to refill his medications for November 11 and December 11.  No other changes will be initiated in his pain management protocol.  I encouraged him to continue efforts at weight loss and strengthening.  I would like for him to build to continue playing golf.  I have encouraged him also to follow-up with his primary care physicians with the ongoing work-up for his energy issues.  We will set him up for 20-month return to clinic. Follow Up Instructions:    I discussed the assessment and treatment plan with the patient. The patient was provided an opportunity to ask questions and all were answered. The patient agreed with the plan and demonstrated an understanding of the instructions.   The patient was advised to call back or seek an  in-person evaluation if the symptoms worsen or if the condition fails to improve as anticipated.  I provided 30 minutes of non-face-to-face time during this encounter.   [name], MD

## 2019-11-17 ENCOUNTER — Telehealth: Payer: Self-pay | Admitting: Internal Medicine

## 2019-11-17 NOTE — Telephone Encounter (Signed)
Patient calling to check the status of results Please call to discuss

## 2019-11-17 NOTE — Telephone Encounter (Signed)
I called and advised the patient that Dr. Caryl Comes has been out of the office the last ~ 2 weeks, but he will be back in clinic tomorrow and I can have him review his echo and cardiac CT results.  I hope to be able to call him tomorrow afternoon.  The patient states he is still having trouble with increased SOB walking back and forth from the mail box and if he does small things around the house he feels winded to the point he has to sit down. I inquire if he is feeling dizzy/ lightheaded with these episodes and he advised he does have dizziness associated with this.   He is aware I will send this update to Dr. Caryl Comes as well.   The patient voices understanding and is agreeable.

## 2019-11-18 NOTE — Telephone Encounter (Signed)
I reviewed my conversation yesterday with the patient with Dr. Caryl Comes today.   He reviewed the patient's echo & Cardiac CT. Per Dr. Caryl Comes, the echo does not show any abnormalities that would be contributing to his SOB. His Cardiac CT is showing possibly 1 small area of blockage, but this should not be enough to cause his breathing difficulties.   Dr. Caryl Comes advised the patient should: 1) send a transmission to make sure he is not in A-fib 2) have an appt with him in the next few weeks to review his symptoms and formulate a further plan of care.  The patient has been advised of these recommendations and is agreeable.  He will try to transmit this afternoon. He is aware I will ask [facility] to look out for this and let me/ Dr. Caryl Comes know the findings.  He is agreeable with coming in to clinic (his preference) on 11/25/19 at 8:00 am in the [place] office.

## 2019-11-19 NOTE — Telephone Encounter (Signed)
Transmission from 11/18/19 reviewed. Device function WNL, presenting EGM shows AS/VP @ 71., 3 AMS episodes all occurring on 10/28/19 with EGMs that show AFL/AF  with controlled v-rates, longest episode was 13 minutes and 41 seconds.

## 2019-11-20 NOTE — Telephone Encounter (Signed)
Reviewed the patient remote transmission as below with Dr. Caryl Comes- no recommendations made today. He is aware the patient is to follow up with him on 11/25/19.  Per Dr. Caryl Comes he will the patient's symptoms of SOB with him more at that appointment.

## 2019-11-22 ENCOUNTER — Emergency Department: Payer: Medicare Other

## 2019-11-22 ENCOUNTER — Emergency Department
Admission: EM | Admit: 2019-11-22 | Discharge: 2019-11-22 | Disposition: A | Discharge status: Home / Self Care | Payer: Medicare Other | Attending: Emergency Medicine | Admitting: Emergency Medicine

## 2019-11-22 ENCOUNTER — Other Ambulatory Visit: Payer: Self-pay

## 2019-11-22 DIAGNOSIS — Z96653 Presence of artificial knee joint, bilateral: Secondary | ICD-10-CM | POA: Diagnosis not present

## 2019-11-22 DIAGNOSIS — Z86711 Personal history of pulmonary embolism: Secondary | ICD-10-CM | POA: Insufficient documentation

## 2019-11-22 DIAGNOSIS — I129 Hypertensive chronic kidney disease with stage 1 through stage 4 chronic kidney disease, or unspecified chronic kidney disease: Secondary | ICD-10-CM | POA: Insufficient documentation

## 2019-11-22 DIAGNOSIS — Z96643 Presence of artificial hip joint, bilateral: Secondary | ICD-10-CM | POA: Diagnosis not present

## 2019-11-22 DIAGNOSIS — Z950 Presence of cardiac pacemaker: Secondary | ICD-10-CM | POA: Diagnosis not present

## 2019-11-22 DIAGNOSIS — R0602 Shortness of breath: Secondary | ICD-10-CM | POA: Diagnosis not present

## 2019-11-22 DIAGNOSIS — N1831 Chronic kidney disease, stage 3a: Secondary | ICD-10-CM | POA: Diagnosis not present

## 2019-11-22 DIAGNOSIS — Z79899 Other long term (current) drug therapy: Secondary | ICD-10-CM | POA: Diagnosis not present

## 2019-11-22 DIAGNOSIS — Z20822 Contact with and (suspected) exposure to covid-19: Secondary | ICD-10-CM | POA: Insufficient documentation

## 2019-11-22 DIAGNOSIS — Z7901 Long term (current) use of anticoagulants: Secondary | ICD-10-CM | POA: Diagnosis not present

## 2019-11-22 LAB — CBC WITH DIFFERENTIAL/PLATELET
Abs Immature Granulocytes: 0.05 10*3/uL (ref 0.00–0.07)
Basophils Absolute: 0.1 10*3/uL (ref 0.0–0.1)
Basophils Relative: 1 %
Eosinophils Absolute: 0.2 10*3/uL (ref 0.0–0.5)
Eosinophils Relative: 4 %
HCT: 40 % (ref 39.0–52.0)
Hemoglobin: 13.6 g/dL (ref 13.0–17.0)
Immature Granulocytes: 1 %
Lymphocytes Relative: 27 %
Lymphs Abs: 1.5 10*3/uL (ref 0.7–4.0)
MCH: 31.8 pg (ref 26.0–34.0)
MCHC: 34 g/dL (ref 30.0–36.0)
MCV: 93.5 fL (ref 80.0–100.0)
Monocytes Absolute: 0.5 10*3/uL (ref 0.1–1.0)
Monocytes Relative: 9 %
Neutro Abs: 3.2 10*3/uL (ref 1.7–7.7)
Neutrophils Relative %: 58 %
Platelets: 160 10*3/uL (ref 150–400)
RBC: 4.28 MIL/uL (ref 4.22–5.81)
RDW: 13.2 % (ref 11.5–15.5)
WBC: 5.6 10*3/uL (ref 4.0–10.5)
nRBC: 0 % (ref 0.0–0.2)

## 2019-11-22 LAB — COMPREHENSIVE METABOLIC PANEL
ALT: 19 U/L (ref 0–44)
AST: 25 U/L (ref 15–41)
Albumin: 4.2 g/dL (ref 3.5–5.0)
Alkaline Phosphatase: 44 U/L (ref 38–126)
Anion gap: 7 (ref 5–15)
BUN: 23 mg/dL (ref 8–23)
CO2: 27 mmol/L (ref 22–32)
Calcium: 8.7 mg/dL — ABNORMAL LOW (ref 8.9–10.3)
Chloride: 104 mmol/L (ref 98–111)
Creatinine, Ser: 0.88 mg/dL (ref 0.61–1.24)
GFR, Estimated: >60 mL/min (ref >60)
Glucose, Bld: 147 mg/dL — ABNORMAL HIGH (ref 70–99)
Potassium: 4.1 mmol/L (ref 3.5–5.1)
Sodium: 138 mmol/L (ref 135–145)
Total Bilirubin: 0.6 mg/dL (ref 0.3–1.2)
Total Protein: 7.1 g/dL (ref 6.5–8.1)

## 2019-11-22 LAB — TROPONIN I (HIGH SENSITIVITY)
Troponin I (High Sensitivity): 5 ng/L (ref <18)
Troponin I (High Sensitivity): 4 ng/L (ref <18)

## 2019-11-22 LAB — RESPIRATORY PANEL BY RT PCR (FLU A&B, COVID)
Influenza A by PCR: NEGATIVE
Influenza B by PCR: NEGATIVE
SARS Coronavirus 2 by RT PCR: NEGATIVE

## 2019-11-22 MED ORDER — METHYLPREDNISOLONE SODIUM SUCC 125 MG IJ SOLR: 80.0000 mg | Freq: Once | INTRAMUSCULAR | Status: DC

## 2019-11-22 MED ORDER — PREDNISONE 20 MG PO TABS
60.0000 mg | ORAL_TABLET | Freq: Once | ORAL | Status: AC
  Administered 2019-11-22: 60 mg via ORAL
  Filled 2019-11-22: qty 3

## 2019-11-22 MED ORDER — IOHEXOL 350 MG/ML SOLN
100.0000 mL | Freq: Once | INTRAVENOUS | Status: AC | PRN
  Administered 2019-11-22: 100 mL via INTRAVENOUS

## 2019-11-22 MED ORDER — PREDNISONE 10 MG PO TABS: 50.0000 mg | ORAL_TABLET | Freq: Every day | ORAL | 0 refills | Status: DC

## 2019-11-22 NOTE — Telephone Encounter (Signed)
Noted  OV scheduled

## 2019-11-22 NOTE — ED Notes (Signed)
Pt ambulated around the room on RA. Pt O2 saturation started at 97% on RA and decreased to 90%. Pt RR increased to 46bpm during ambulation. Vladimir Crofts, NP made aware.

## 2019-11-22 NOTE — Discharge Instructions (Signed)
Please follow-up with cardiology or your primary care provider.  Return to the emergency department if you develop chest pain or your breathing is more difficult.

## 2019-11-22 NOTE — ED Notes (Signed)
Pt presents to the ED for increasing SOB for approx 2 weeks. Pt was seen by Dr. Jens Som, pt states they did a CT scan of his chest and [place] of his heart and suppose to follow up with him on Tuesday. Pt states his SOB is when he is lying flat or bending over. 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 Denies CP/fevers/NVD. Pt connected to cardiac monitor. VSS. Pt A&Ox4 and NAD.

## 2019-11-22 NOTE — ED Notes (Signed)
[name] was sent to lab. 

## 2019-11-22 NOTE — ED Triage Notes (Signed)
Pt reports SHOB when laying flat, bending over, or with any exertion x2 weeks Pt talked to PCP this week and was suppose to f/u Tuesday

## 2019-11-22 NOTE — ED Provider Notes (Signed)
[facility] Emergency Department Provider Note ____________________________________________   First MD Initiated Contact with Patient 11/22/19 1516     (approximate)  I have reviewed the triage vital signs and the nursing notes.   HISTORY  Chief Complaint Shortness of Breath  HPI Nicholas [name] is a 74 y.o. male with history of PE, DVT, heart block, and other medical history as listed below presents to the emergency department for treatment and evaluation of shortness of breath.  Patient states for the past 2 weeks he has been short of breath with any exertion.  He was evaluated by his cardiologist and had a "heart CT and an echocardiogram." Follow up appointment is scheduled for Tuesday. He denies chest pain or leg pain. He is currently on Eliquis.       Past Medical History:  Diagnosis Date   Anterior urethral stricture    Anxiety    Arthritis    a. knees, hips, hands;  b. 11/2013 s/p L TKA @ [facility].   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 Dr Caryl Comes   Depression    DVT (deep venous thrombosis) (HCC)    Erosive esophagitis    Gross hematuria    Hyperlipemia    Hypertension    borderline   Internal hemorrhoids    Phimosis    Pulmonary embolism [facility])     Patient Active Problem List   Diagnosis Date Noted   Restless leg syndrome 11/10/2019   PAF (paroxysmal atrial fibrillation) ([place]) 10/14/2019   Pacemaker - MDT 10/14/2019   Morbid obesity ([place]) 07/01/2019   Long-term current use of benzodiazepine 03/26/2019   Stage 3a chronic kidney disease ([place]) 03/26/2019   IFG (impaired fasting glucose) 03/26/2019   BPH (benign prostatic hyperplasia) 07/11/2018   Chronic, continuous use of opioids 07/05/2018   Chronic pain syndrome 07/05/2018   Bilateral hand pain 07/05/2018   Facet syndrome,  lumbar 07/05/2018   Lumbar radiculopathy 06/05/2017   Trochanteric bursitis 06/05/2017   Complete heart block ([place]) 02/16/2016   History of total knee replacement, bilateral 08/10/2015   Abdominal aortic atherosclerosis ([place]) 05/11/2015   Depression, recurrent ([place]) 10/20/2014   Anxiety 10/20/2014   Mechanical complication of prosthetic hip implant ([place]) 07/13/2014   Chronic orthostatic hypotension 04/17/2013   HTN (hypertension) 04/17/2013   Dyslipidemia 04/17/2013   GERD (gastroesophageal reflux disease) 04/17/2013   History of pulmonary embolism 04/17/2013   Advanced care planning/counseling discussion 04/14/2013    Past Surgical History:  Procedure Laterality Date   BUNIONECTOMY Bilateral 01/06/2015   Procedure: Lillard Anes;  Surgeon: [name], MD;  Location: [facility] ORS;  Service: Orthopedics;  Laterality: Bilateral;   CARDIAC CATHETERIZATION  ~ 2005   "once"   CATARACT EXTRACTION W/ INTRAOCULAR LENS  IMPLANT, BILATERAL Bilateral ~ 2010   COLONOSCOPY WITH PROPOFOL N/A 12/07/2015   Procedure: COLONOSCOPY WITH PROPOFOL;  Surgeon: Lollie Sails, MD;  Location: [facility] ENDOSCOPY;  Service: Endoscopy;  Laterality: N/A;   EP IMPLANTABLE DEVICE N/A 02/16/2016   MDT dual chamber (His Bundle) pacemaker implanted by Dr Caryl Comes for intermittent complete heart block   ESOPHAGOGASTRODUODENOSCOPY (EGD) WITH PROPOFOL N/A 12/07/2015   Procedure: ESOPHAGOGASTRODUODENOSCOPY (EGD) WITH PROPOFOL;  Surgeon: Lollie Sails, MD;  Location: [facility] ENDOSCOPY;  Service: Endoscopy;  Laterality: N/A;   ESOPHAGOGASTRODUODENOSCOPY (EGD) WITH PROPOFOL N/A 05/17/2017   Procedure: ESOPHAGOGASTRODUODENOSCOPY (EGD)  WITH PROPOFOL;  Surgeon: Lollie Sails, MD;  Location: [facility] ENDOSCOPY;  Service: Endoscopy;  Laterality: N/A;   HAMMER TOE SURGERY Bilateral 01/06/2015   Procedure: HAMMER TOE CORRECTION;  Surgeon: [name], MD;  Location: [facility] ORS;  Service: Orthopedics;  Laterality:  Bilateral;   KNEE CARTILAGE SURGERY Left 1965   "football injury"   Left Total Knee Arthroplasty     a. 11/2013 [facility].   PILONIDAL CYST EXCISION  1970's   TOTAL HIP ARTHROPLASTY Right 2004   TOTAL HIP ARTHROPLASTY Left 2006   UPPER GI ENDOSCOPY      Prior to Admission medications   Medication Sig Start Date End Date Taking? Authorizing Provider  acetaminophen (TYLENOL) 650 MG CR tablet Take 650 mg by mouth daily.     [provider]  apixaban (ELIQUIS) 5 MG TABS tablet Take 5 mg by mouth 2 (two) times daily.    [provider]  ascorbic acid (VITAMIN C) 250 MG tablet Take 1,000 mg by mouth daily.     [provider]  Cranberry 500 MG CAPS Take 1 capsule by mouth daily.     [provider]  Cyanocobalamin (B-12) 1000 MCG CAPS Take 1,000 mcg by mouth daily.     [provider]  ferrous sulfate 325 (65 FE) MG tablet Take 325 mg by mouth daily with breakfast.    [provider]  flecainide (TAMBOCOR) 100 MG tablet TAKE 1 TABLET BY MOUTH TWICE A DAY 10/08/19   Deboraha Sprang, MD  gabapentin (NEURONTIN) 600 MG tablet Take 1 tablet (600 mg total) by mouth at bedtime. 11/10/19   Cannady, [name], NP  HYDROcodone-acetaminophen (NORCO/VICODIN) 5-325 MG tablet Take 1 tablet by mouth 2 (two) times daily as needed. 01/03/20 02/02/20  [name], MD  HYDROcodone-acetaminophen (NORCO/VICODIN) 5-325 MG tablet Take 1 tablet by mouth 2 (two) times daily as needed for moderate pain or severe pain. 12/03/20 01/02/21  [name], MD  hyoscyamine (LEVSIN, ANASPAZ) 0.125 MG tablet Take 0.125 mg by mouth every 6 (six) hours as needed for bladder spasms or cramping.  09/07/15   [provider]  LORazepam (ATIVAN) 1 MG tablet Take 1 tablet (1 mg total) by mouth at bedtime. 01/08/20   Cannady, [name], NP  Magnesium 400 MG TABS Take 400 mg by mouth daily.     [provider]  Melatonin 10 MG TABS Take 10 mg by mouth at bedtime.      [provider]  methocarbamol (ROBAXIN) 500 MG tablet Take 1 tablet (500 mg total) by mouth 2 (two) times daily as needed for muscle spasms. 05/13/19   [name], MD  midodrine (PROAMATINE) 5 MG tablet TAKE 1 TABLET (5 MG) IN THE MORNING AFTER WAKING UP AND TAKE 1 TABLET (5 MG) IN THE AFTERNOON AFTER YOU NAP 09/26/19   Deboraha Sprang, MD  Multiple Vitamin (MULTI-VITAMINS) TABS Take 1 tablet by mouth daily.     [provider]  Omega-3 Fatty Acids (FISH OIL) 1200 MG CAPS Take 1,200 mg by mouth daily.    [provider]  oxymetazoline (AFRIN) 0.05 % nasal spray Place 2 sprays into both nostrils at bedtime as needed for congestion.    [provider]  pantoprazole (PROTONIX) 20 MG tablet Take 1 tablet (20 mg total) by mouth daily. 09/10/19   Cannady, [name], NP  predniSONE (DELTASONE) 10 MG tablet Take 5 tablets (50 mg total) by mouth daily. 11/22/19   Keldan Eplin, Johnette Abraham  B, FNP  Probiotic Product ([place]) CAPS Take by mouth.  Patient not taking: Reported on 11/10/2019    [provider]  QUEtiapine Fumarate (SEROQUEL XR) 150 MG 24 hr tablet TAKE 1 TABLET (150 MG TOTAL) BY MOUTH AT BEDTIME. 03/04/19   Cannady, Jolene T, NP  simvastatin (ZOCOR) 20 MG tablet Take 1 tablet (20 mg total) by mouth daily. 09/01/19   Cannady, [name], NP  solifenacin (VESICARE) 10 MG tablet Take 1 tablet (10 mg total) by mouth daily. 09/03/19   Cannady, [name], NP  triamcinolone cream (KENALOG) 0.1 % Apply 1 application topically 2 (two) times daily.  Patient not taking: Reported on 11/10/2019    [provider]  venlafaxine XR (EFFEXOR XR) 75 MG 24 hr capsule Take 3 capsules (225 mg total) by mouth daily. 10/06/19   Venita Lick, NP    Allergies Patient has no known allergies.  Family History  Problem Relation Age of Onset   Stroke Father        deceased   Diabetes Father    Hypertension Father    Alcoholism Mother        died in her 60's.     Cancer Mother    Glaucoma Sister    Breast cancer Sister    Prostate cancer Paternal Grandfather     Social History Social History   Tobacco Use   Smoking status: Never Smoker   Smokeless tobacco: Never Used  Vaping Use   Vaping Use: Never used  Substance Use Topics   Alcohol use: Yes    Alcohol/week: 0.0 standard drinks    Comment: beers seldom    Drug use: No    Review of Systems  Constitutional: No fever/chills. Denies unintentional fluctuation in weight Eyes: No visual changes. ENT: No sore throat. Cardiovascular: Denies chest pain. Denies lower extremity swelling. Respiratory: Positive for shortness of breath. Gastrointestinal: No abdominal pain. No nausea, no vomiting.  No diarrhea.  No constipation. Genitourinary: Negative for dysuria. Musculoskeletal: Negative for back pain. Skin: Negative for rash. Neurological: Negative for headaches, focal weakness or numbness. ____________________________________________   PHYSICAL EXAM:  VITAL SIGNS: ED Triage Vitals  Enc Vitals Group     BP 11/22/19 1131 (!) 144/69     Pulse Rate 11/22/19 1131 85     Resp 11/22/19 1131 18     Temp 11/22/19 1131 98.2 F (36.8 C)     Temp Source 11/22/19 1131 Oral     SpO2 11/22/19 1131 99 %     Weight 11/22/19 1146 272 lb (123.4 kg)     Height 11/22/19 1146 5\' 11"  (1.803 m)     Head Circumference --      Peak Flow --      Pain Score 11/22/19 1146 0     Pain Loc --      Pain Edu? --      Excl. in [place]? --     Constitutional: Alert and oriented. Appears dyspneic with ambulation. Eyes: Conjunctivae are normal. Head: Atraumatic. Nose: No congestion/rhinnorhea. Mouth/Throat: Mucous membranes are moist.   Neck: No stridor.   Cardiovascular: Normal rate, regular rhythm. Grossly normal heart sounds.  Good peripheral circulation. No lower extremity edema. Respiratory: Normal respiratory effort.  No retractions. Lungs CTAB. Gastrointestinal: Soft and nontender. No  distention. No abdominal bruits. No ascites Genitourinary:  Musculoskeletal: No lower extremity tenderness nor edema.  No joint effusions. Neurologic:  Normal speech and language. No gross focal neurologic deficits are appreciated. No  gait instability. Skin:  Skin is warm, dry and intact. No rash noted. Psychiatric: Mood and affect are normal. Speech and behavior are normal.  ____________________________________________   LABS (all labs ordered are listed, but only abnormal results are displayed)  Labs Reviewed  COMPREHENSIVE METABOLIC PANEL - Abnormal; Notable for the following components:      Result Value   Glucose, Bld 147 (*)    Calcium 8.7 (*)    All other components within normal limits  RESPIRATORY PANEL BY RT PCR (FLU A&B, COVID)  CBC WITH DIFFERENTIAL/PLATELET  TROPONIN I (HIGH SENSITIVITY)  TROPONIN I (HIGH SENSITIVITY)   ____________________________________________  EKG  ED ECG REPORT I, Kyal Arts, FNP-BC personally viewed and interpreted this ECG.   Date: 11/22/2019  EKG Time: 11:30am  Rate: 86  Rhythm: Ventricular paced rhythm  Axis: normal  Intervals:none  ST&T Change: no ST elevation  ____________________________________________  RADIOLOGY  ED MD interpretation:   Chest x-ray shows no acute findings of concern.  No acute concerns on CTA chest for PE.  I, Deijah Spikes, personally viewed and evaluated these images (plain radiographs) as part of my medical decision making, as well as reviewing the written report by the radiologist.  Official radiology report(s): CT Angio Chest PE W and/or Wo Contrast  Result Date: 11/22/2019 CLINICAL DATA:  Suspect pulmonary embolus. Shortness of breath when lying flat, bending over, or any exertion. Symptoms for 2 weeks. EXAM: CT ANGIOGRAPHY CHEST WITH CONTRAST TECHNIQUE: Multidetector CT imaging of the chest was performed using the standard protocol during bolus administration of intravenous contrast.  Multiplanar CT image reconstructions and MIPs were obtained to evaluate the vascular anatomy. CONTRAST:  118mL OMNIPAQUE IOHEXOL 350 MG/ML SOLN COMPARISON:  Coronary CT on 10/31/2019 and CT chest on 02/05/2014 FINDINGS: Cardiovascular: The heart is enlarged. There is extensive atherosclerotic calcification of the coronary arteries. Patient has LEFT-sided transvenous pacemaker with leads to the RIGHT atrium and RIGHT ventricle. There is atherosclerotic calcification of the thoracic aorta. Ascending aorta is 4.3 centimeters. The proximal arch is 3.9 centimeters. Mid arch is 3.7 centimeters. Descending aorta is 2.8 centimeters. No evidence for dissection. The pulmonary arteries are well opacified and there is no acute pulmonary embolus. The pulmonary artery is enlarged, 3.3 centimeters and consistent with pulmonary arterial hypertension. Mediastinum/Nodes: The visualized portion of the thyroid gland has a normal appearance. No significant mediastinal, hilar, or axillary adenopathy. Esophagus is unremarkable. Lungs/Pleura: There is minimal dependent atelectasis primarily within the RIGHT LOWER lobe. No focal consolidations. No pleural effusions or pulmonary edema. No suspicious pulmonary nodules. Airways are patent. Upper Abdomen: There is atherosclerotic calcification of the abdominal aorta. No acute UPPER abdominal abnormality. Gallbladder is present. Musculoskeletal: Degenerative changes in the LOWER thoracic spine. Remote LEFT rib fractures. Review of the MIP images confirms the above findings. IMPRESSION: 1. Technically adequate exam showing no acute pulmonary embolus. 2. Enlarged pulmonary artery, consistent with pulmonary arterial hypertension. 3. Aortic Atherosclerosis (ICD10-I70.0). 4. 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 Recommend annual imaging followup by CTA or MRA. This recommendation follows 2010 ACCF/AHA/AATS/ACR/ASA/SCA/SCAI/SIR/STS/SVM Guidelines for the Diagnosis and Management of  Patients with Thoracic Aortic Disease. Circulation. 2010; 121: U440-H474. Aortic aneurysm NOS (ICD10-I71.9) 5. Aortic aneurysm NOS (ICD10-I71.9). Electronically Signed   By: Nolon Nations M.D.   On: 11/22/2019 16:11    ____________________________________________   PROCEDURES  Procedure(s) performed (including Critical Care):  Procedures  ____________________________________________   INITIAL IMPRESSION / ASSESSMENT AND PLAN     74 year old male presenting to the emergency department for  evaluation of shortness of breath.  See HPI for further details.  And will be to review protocol lab studies and EKG that were obtained while awaiting ER room assignment.  Will likely obtain a CTA to rule out PE  DIFFERENTIAL DIAGNOSIS  PE, pneumonia, COVID-19, influenza  ED COURSE  CTA to rule out PE is negative.  Chest x-ray is negative.  Labs are overall reassuring with a normal troponin.  Electrolytes are unremarkable.  COVID-19, influenza, testing are both negative.  Patient ambulatory saturation 93 to 96% on room air.  Patient does not have home O2.  While ambulating, he denied chest pain and his heart rate remained 60-70.  Conversation between patient and Dr. Caryl Comes regarding results of the cardiac CT and echocardiogram reviewed.  There was no abnormalities that would be contributing to his shortness of breath found on those studies.  He recommended that he interrogate his pacemaker to ensure that he is not in A. fib.  Patient and wife report that they did that without any feedback or recommendation to contact cardiology or return to the emergency department.  EKG today shows ventricular paced rhythm.  He denies any feelings of palpitations or flutter.  With reassuring results from both cardiology and his work-up today, patient will be discharged home to follow-up as scheduled with Dr. Caryl Comes on 11/25/2019.  He was given strict ER return precautions to return for any change in symptoms, onset  of chest pain, worsening shortness of breath. Will try a steroid burst to see if that provides any symptom relief.    ___________________________________________   FINAL CLINICAL IMPRESSION(S) / ED DIAGNOSES  Final diagnoses:  Shortness of breath     ED Discharge Orders         Ordered    predniSONE (DELTASONE) 10 MG tablet  Daily        11/22/19 1746           Nicholas [name] was evaluated in Emergency Department on 11/23/2019 for the symptoms described in the history of present illness. He was evaluated in the context of the global COVID-19 pandemic, which necessitated consideration that the patient might be at risk for infection with the SARS-CoV-2 virus that causes COVID-19. Institutional protocols and algorithms that pertain to the evaluation of patients at risk for COVID-19 are in a state of rapid change based on information released by regulatory bodies including the CDC and federal and state organizations. These policies and algorithms were followed during the patient's care in the ED.   Note:  This document was prepared using Dragon voice recognition software and may include unintentional dictation errors.   Victorino Dike, FNP 11/23/19 1234    Nena Polio, MD 11/24/19 0005

## 2019-11-25 ENCOUNTER — Telehealth: Payer: Self-pay | Admitting: Internal Medicine

## 2019-11-25 ENCOUNTER — Ambulatory Visit (INDEPENDENT_AMBULATORY_CARE_PROVIDER_SITE_OTHER): Payer: Medicare Other | Admitting: Internal Medicine

## 2019-11-25 ENCOUNTER — Other Ambulatory Visit: Payer: Self-pay

## 2019-11-25 ENCOUNTER — Encounter: Payer: Self-pay | Admitting: Internal Medicine

## 2019-11-25 VITALS — BP 140/80 | HR 78 | Ht 71.0 in | Wt 281.0 lb

## 2019-11-25 DIAGNOSIS — Z950 Presence of cardiac pacemaker: Secondary | ICD-10-CM

## 2019-11-25 DIAGNOSIS — I4819 Other persistent atrial fibrillation: Secondary | ICD-10-CM

## 2019-11-25 DIAGNOSIS — I442 Atrioventricular block, complete: Secondary | ICD-10-CM | POA: Diagnosis not present

## 2019-11-25 DIAGNOSIS — R0600 Dyspnea, unspecified: Secondary | ICD-10-CM

## 2019-11-25 MED ORDER — ROSUVASTATIN CALCIUM 20 MG PO TABS: 20.0000 mg | ORAL_TABLET | Freq: Every day | ORAL | 6 refills | Status: DC

## 2019-11-25 MED ORDER — PROPAFENONE HCL ER 225 MG PO CP12: 225.0000 mg | ORAL_CAPSULE | Freq: Two times a day (BID) | ORAL | 6 refills | Status: DC

## 2019-11-25 MED ORDER — TORSEMIDE 20 MG PO TABS: ORAL_TABLET | ORAL | 3 refills | Status: DC

## 2019-11-25 NOTE — Patient Instructions (Addendum)
Medication Instructions:  - Your physician has recommended you make the following change in your medication:   1) STOP flecainide  2) START rhythmol (propafenone SR) 225 mg- take 1 capsule by mouth twice daily - if this is very costly at the pharmacy, please call back and let us know and we will send in a shorter acting tablet  3) START torsemide 20 mg - take 1 tablet by mouth once daily x 5 days, then take 1 tablet once daily as needed for increased shortness of breath   4) STOP simvastatin  5) START crestor (rosuvastatin) 20 mg- take 1 tablet by mouth once daily   *If you need a refill on your cardiac medications before your next appointment, please call your pharmacy*   Lab Work: - none ordered  If you have labs (blood work) drawn today and your tests are completely normal, you will receive your results only by: Marland Kitchen MyChart Message (if you have MyChart) OR . A paper copy in the mail If you have any lab test that is abnormal or we need to change your treatment, we will call you to review the results.   Testing/Procedures: - none ordered   Follow-Up: At [facility], you and your health needs are our priority.  As part of our continuing mission to provide you with exceptional heart care, we have created designated Provider Care Teams.  These Care Teams include your primary Cardiologist (physician) and Advanced Practice Providers (APPs -  Physician Assistants and Nurse Practitioners) who all work together to provide you with the care you need, when you need it.  We recommend signing up for the patient portal called "MyChart".  Sign up information is provided on this After Visit Summary.  MyChart is used to connect with patients for Virtual Visits (Telemedicine).  Patients are able to view lab/test results, encounter notes, upcoming appointments, etc.  Non-urgent messages can be sent to your provider as well.   To learn more about what you can do with MyChart, go to  [email or web address].    Your next appointment:   3 month(s)  The format for your next appointment:   In Person  Provider:   Virl Axe, MD   Other Instructions n/a

## 2019-11-25 NOTE — Progress Notes (Signed)
Patient Care Team: Venita Lick, NP as PCP - General (Nurse Practitioner) [name], MD (Specialist) Hollice Espy, MD as Consulting Physician (Urology) Tanda Rockers, MD as Consulting Physician (Pulmonary Disease) [name], MD as Consulting Physician (Anesthesiology) Deboraha Sprang, MD as Consulting Physician (Cardiology) Greg Cutter, LCSW as Social Worker (Licensed Clinical Social Worker) Vladimir Faster, [facility] (Pharmacist)   HPI  Nicholas [name] is a 74 y.o. 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  It was elected to start him on flecainide.   He has obstructive sleep apnea by history but has not been interested in testing or therapy   The patient denies chest pain, shortness of breath, nocturnal dyspnea, orthopnea or peripheral edema.  There have been no palpitations he has had problems with lightheadedness.  He has good days and bad days.  Sometimes he can finish playing golf sometimes he cannot.  He clearly makes his symptoms worse.  He has known orthostatic hypotension.  Reviewing the data in the chart, there are 2 episodes in 2019 documented.  1 the blood pressure systolic went 672--094; the other went 124--87 with standing.    DATE TEST EF   10/15 echo     50 %   Mild RV dysfunction   11/17 echo    60 %    Nl RV function   11/17 Myoview   46 ([facility])   10/21 Cardiac-CTA  LADd- FFR 0.75; o/w >90  10/21 Echo 60-65%   10/21 CTA-PE      hxstory of pulmonary embolism with repeat venous Dopplers 1/16 demonstrated small clots Managed with chronic apixaban  complaints of dyspnea and seen in ER yday with neg CTA-PE and CXR ; no BNP; orthopnea/nocturnal dyspnea as well as some early satiety.  Denies peripheral edema  DATE PR  interval QRSduration Dose  3/19 228 126 (HIS pacing) 100  3/21 P-synchronous   pacing  124 ( His pacing) 100          Date Cr Hgb  6/19 1.08 14.6  6/20 1.13 14.7  6/21 1.17<<1.46 70.9    Thromboembolic risk factors ( age -57 , HTN-1) for a CHADSVASc Score of 2      Past Medical History:  Diagnosis Date  . Anterior urethral stricture   . Anxiety   . Arthritis    a. knees, hips, hands;  b. 11/2013 s/p L TKA @ [place].  . Bile reflux gastritis   . Bulging lumbar disc   . BXO (balanitis xerotica obliterans)   . 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 Depression   . DVT (deep venous thrombosis) ([place])   . Erosive esophagitis   . Gross hematuria   . Hyperlipemia   . Hypertension    borderline  . Internal hemorrhoids   . Phimosis   . Pulmonary embolism [facility])     Past Surgical History:  Procedure Laterality Date  . BUNIONECTOMY Bilateral 01/06/2015   Procedure: BUNIONECTOMY;  Surgeon: [name], MD;  Location: [facility] ORS;  Service: Orthopedics;  Laterality: Bilateral;  . CARDIAC CATHETERIZATION  ~ 2005   "once"  . CATARACT EXTRACTION W/ INTRAOCULAR LENS  IMPLANT, BILATERAL Bilateral ~ 2010  .  COLONOSCOPY WITH PROPOFOL N/A 12/07/2015   Procedure: COLONOSCOPY WITH PROPOFOL;  Surgeon: Lollie Sails, MD;  Location: [facility] ENDOSCOPY;  Service: Endoscopy;  Laterality: N/A;  . EP IMPLANTABLE DEVICE N/A 02/16/2016   MDT dual chamber (His Bundle) pacemaker implanted by Dr Caryl Comes for intermittent complete heart block  . ESOPHAGOGASTRODUODENOSCOPY (EGD) WITH PROPOFOL N/A 12/07/2015   Procedure: ESOPHAGOGASTRODUODENOSCOPY (EGD) WITH PROPOFOL;  Surgeon: Lollie Sails, MD;  Location: [facility] ENDOSCOPY;  Service: Endoscopy;  Laterality: N/A;  . ESOPHAGOGASTRODUODENOSCOPY (EGD) WITH PROPOFOL N/A 05/17/2017   Procedure: ESOPHAGOGASTRODUODENOSCOPY (EGD) WITH PROPOFOL;  Surgeon: Lollie Sails, MD;  Location: [facility] ENDOSCOPY;   Service: Endoscopy;  Laterality: N/A;  . HAMMER TOE SURGERY Bilateral 01/06/2015   Procedure: HAMMER TOE CORRECTION;  Surgeon: [name], MD;  Location: [facility] ORS;  Service: Orthopedics;  Laterality: Bilateral;  . KNEE CARTILAGE SURGERY Left 1965   "football injury"  . Left Total Knee Arthroplasty     a. 11/2013 [facility].  Marland Kitchen PILONIDAL CYST EXCISION  1970's  . TOTAL HIP ARTHROPLASTY Right 2004  . TOTAL HIP ARTHROPLASTY Left 2006  . UPPER GI ENDOSCOPY      Current Outpatient Medications  Medication Sig Dispense Refill  . acetaminophen (TYLENOL) 650 MG CR tablet Take 650 mg by mouth daily.     Marland Kitchen apixaban (ELIQUIS) 5 MG TABS tablet Take 5 mg by mouth 2 (two) times daily.    Marland Kitchen ascorbic acid (VITAMIN C) 250 MG tablet Take 1,000 mg by mouth daily.     . Cranberry 500 MG CAPS Take 1 capsule by mouth daily.     . ferrous sulfate 325 (65 FE) MG tablet Take 325 mg by mouth daily with breakfast.    . flecainide (TAMBOCOR) 100 MG tablet TAKE 1 TABLET BY MOUTH TWICE A DAY 180 tablet 1  . gabapentin (NEURONTIN) 600 MG tablet Take 1 tablet (600 mg total) by mouth at bedtime. 90 tablet 4  . [START ON 01/03/2020] HYDROcodone-acetaminophen (NORCO/VICODIN) 5-325 MG tablet Take 1 tablet by mouth 2 (two) times daily as needed. 60 tablet 0  . [START ON 12/03/2020] HYDROcodone-acetaminophen (NORCO/VICODIN) 5-325 MG tablet Take 1 tablet by mouth 2 (two) times daily as needed for moderate pain or severe pain. 60 tablet 0  . hyoscyamine (LEVSIN, ANASPAZ) 0.125 MG tablet Take 0.125 mg by mouth every 6 (six) hours as needed for bladder spasms or cramping.     Derrill Memo ON 01/08/2020] LORazepam (ATIVAN) 1 MG tablet Take 1 tablet (1 mg total) by mouth at bedtime. 90 tablet 0  . Magnesium 400 MG TABS Take 400 mg by mouth daily.     . Melatonin 10 MG TABS Take 10 mg by mouth at bedtime.     . methocarbamol (ROBAXIN) 500 MG tablet Take 1 tablet (500 mg total) by mouth 2 (two) times daily as needed for muscle spasms. 60  tablet 2  . midodrine (PROAMATINE) 5 MG tablet TAKE 1 TABLET (5 MG) IN THE MORNING AFTER WAKING UP AND TAKE 1 TABLET (5 MG) IN THE AFTERNOON AFTER YOU NAP 180 tablet 2  . Multiple Vitamin (MULTI-VITAMINS) TABS Take 1 tablet by mouth daily.     . Omega-3 Fatty Acids (FISH OIL) 1200 MG CAPS Take 1,200 mg by mouth daily.    Marland Kitchen oxymetazoline (AFRIN) 0.05 % nasal spray Place 2 sprays into both nostrils at bedtime as needed for congestion.    . pantoprazole (PROTONIX) 20 MG tablet Take 1 tablet (20 mg total) by mouth daily. 90 tablet  4  . predniSONE (DELTASONE) 10 MG tablet Take 5 tablets (50 mg total) by mouth daily. 25 tablet 0  . QUEtiapine Fumarate (SEROQUEL XR) 150 MG 24 hr tablet TAKE 1 TABLET (150 MG TOTAL) BY MOUTH AT BEDTIME. 90 tablet 4  . simvastatin (ZOCOR) 20 MG tablet Take 1 tablet (20 mg total) by mouth daily. 90 tablet 4  . solifenacin (VESICARE) 10 MG tablet Take 1 tablet (10 mg total) by mouth daily. 90 tablet 4  . triamcinolone cream (KENALOG) 0.1 % Apply 1 application topically 2 (two) times daily.     Marland Kitchen venlafaxine XR (EFFEXOR XR) 75 MG 24 hr capsule Take 3 capsules (225 mg total) by mouth daily. 270 capsule 4   No current facility-administered medications for this visit.    No Known Allergies    Review of Systems negative except from HPI and PMH  Physical Exam   BP 140/80 (BP Location: Left Arm, Patient Position: Sitting, Cuff Size: Normal)   Pulse 78   Ht 5\' 11"  (1.803 m)   Wt 281 lb (127.5 kg)   SpO2 93%   BMI 39.19 kg/m  Well developed and well nourished in no acute distress HENT normal Neck supple with JVP 8-10 cm Clear Device pocket well healed; without hematoma or erythema.  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  However, given the identification of coronary disease, flecainide is no longer appropriate.  Based on ESVEM, will however use propafenone at 225 twice daily.  His dyspnea is troubling.  Cardiac CTA fails to identify significant obstruction to explain it.  His pulmonary CTA also was negative.  On examination he is volume overloaded and with early satiety I suspect that he has biventricular acute/chronic HFpEF.  We will put him on torsemide using it because of the early satiety suggestive of abdominal edema.  Use it x5 days with a goal of 5-8 pounds of weight loss.  They can use as needed.  With his coronary disease LDL is elevated at 10 6 and hypertriglyceridemia.  We will discontinue simvastatin and put him on Crestor 20

## 2019-11-25 NOTE — Telephone Encounter (Signed)
Please call to discuss Nicholas [name] . Patient wife states this is too costly.

## 2019-11-26 MED ORDER — PROPAFENONE HCL 225 MG PO TABS: 225.0000 mg | ORAL_TABLET | Freq: Two times a day (BID) | ORAL | 6 refills | Status: DC

## 2019-11-26 NOTE — Telephone Encounter (Signed)
Patient's wife is calling to follow up on her initial call from yesterday. Please call to discuss.

## 2019-11-26 NOTE — Telephone Encounter (Signed)
I attempted to call the patient/ his wife back. No answer- I left a detailed message on their identified voice mail that I was sending in the shorter acting propafenone per Dr. Caryl Comes. This will still be propafenone 225 mg BID, but it is the tablet form (not SR).  I asked that they call me back with any further questions/ concerns.

## 2019-11-28 ENCOUNTER — Telehealth: Payer: Self-pay | Admitting: Internal Medicine

## 2019-11-28 NOTE — Telephone Encounter (Signed)
I attempted to call Nicholas [name] back. No answer- I left a message on her identified voice mail that there is no interaction between the torsemide & vesicare- they are ok to take together.   I asked that she call back with any further questions/ concerns.

## 2019-11-28 NOTE — Telephone Encounter (Signed)
Patient spouse calling  Wants to know if he can take torsemide and solifenacin (VESICARE) together? Please call to discuss

## 2019-11-28 NOTE — Telephone Encounter (Signed)
I am unfamiliar with vesicare, but per Micromedex, there does not appear to be any contraindication between that and torsemide.   Will forward to pharmacy to confirm.

## 2019-11-28 NOTE — Telephone Encounter (Signed)
No interaction, ok to take together

## 2019-12-11 ENCOUNTER — Telehealth: Payer: Self-pay | Admitting: Anesthesiology

## 2019-12-11 NOTE — Telephone Encounter (Signed)
Called Dr. Andree Elk and left him a voicemail to correct this.

## 2019-12-11 NOTE — Telephone Encounter (Signed)
Dr. Andree Elk returned call. States he will resend [facility].

## 2019-12-11 NOTE — Telephone Encounter (Signed)
Please ask Dr. Andree Elk to resend this patient's scripts for hydrocodone. The start date on them have 12-03-20. Needs to be 12-04-19. December is correct just need November resent

## 2019-12-12 MED ORDER — HYDROCODONE-ACETAMINOPHEN 5-325 MG PO TABS
1.0000 | ORAL_TABLET | Freq: Two times a day (BID) | ORAL | 0 refills | Status: AC | PRN
Start: 2019-12-12 — End: 2020-01-11

## 2019-12-12 MED ORDER — HYDROCODONE-ACETAMINOPHEN 5-325 MG PO TABS
1.0000 | ORAL_TABLET | Freq: Two times a day (BID) | ORAL | 0 refills | Status: DC | PRN
Start: 2019-12-04 — End: 2019-12-12

## 2019-12-12 NOTE — Addendum Note (Signed)
Addended by: [name] on: 12/12/2019 06:49 AM   Modules accepted: Orders

## 2019-12-15 ENCOUNTER — Telehealth: Payer: Self-pay | Admitting: Internal Medicine

## 2019-12-15 ENCOUNTER — Ambulatory Visit (INDEPENDENT_AMBULATORY_CARE_PROVIDER_SITE_OTHER): Payer: Medicare Other

## 2019-12-15 DIAGNOSIS — I442 Atrioventricular block, complete: Secondary | ICD-10-CM

## 2019-12-15 DIAGNOSIS — Z79899 Other long term (current) drug therapy: Secondary | ICD-10-CM

## 2019-12-15 DIAGNOSIS — E876 Hypokalemia: Secondary | ICD-10-CM

## 2019-12-15 DIAGNOSIS — I4819 Other persistent atrial fibrillation: Secondary | ICD-10-CM

## 2019-12-15 NOTE — Telephone Encounter (Signed)
Pt c/o medication issue:  1. Name of Medication: torsemide  2. How are you currently taking this medication (dosage and times per day)? 20 mg  PO  BID   3. Are you having a reaction (difficulty breathing--STAT)?    4. What is your medication issue? Weakness fatigue    Wife concerned he may need to be seen or have potassium checked

## 2019-12-15 NOTE — Telephone Encounter (Signed)
Manual transmission received.    Pt is in AF.  Has history of known persistent AF, it looks like current episode just started a couple of days ago though

## 2019-12-15 NOTE — Telephone Encounter (Signed)
I spoke with the patient's wife to follow up on reported symptoms this morning. Per Mrs. Nicholas [name], the patient has been taking torsemide 20 mg- 2 tablets (40 mg) once daily since seeing Dr. Caryl Comes on 11/25/19.  At check out from that appointment, he was given instructions to:  START torsemide 20 mg - take 1 tablet by mouth once daily x 5 days, then take 1 tablet once daily as needed for increased shortness of breath   The patient's wife advised the 1 tablet a day was not working and the patient was not urinating, but that Dr. Caryl Comes had advised "if 1 pill didn't work, he can take 2 pills."  The patient's weight is down 14 lbs since his office visit.  He had 2 reported falls last week where he seemed disoriented per Mrs. Nicholas [name].  The patient is pending a neurology appointment tomorrow morning at 8:00 am.  I have advised that we should check a BMP on the patient to make sure his sodium/ potassium, & kidney functions are ok.  Mrs. Nicholas [name] is agreeable and will bring the patient in to the [facility] tomorrow after his visit to have this done.   I advised he should send a transmission as well. Per Mrs. Nicholas [name], he had a scheduled transmission today.  I will check with [facility] to see if this showed anything.   Mrs. Nicholas [name] is aware I will follow up with her tomorrow after the BMP results, I have a response back from the [facility] about his transmission and can review with Dr. Caryl Comes.   She voices understanding and is agreeable.

## 2019-12-16 ENCOUNTER — Other Ambulatory Visit: Payer: Self-pay | Admitting: Neurology

## 2019-12-16 ENCOUNTER — Other Ambulatory Visit
Admission: RE | Admit: 2019-12-16 | Discharge: 2019-12-16 | Disposition: A | Discharge status: Home / Self Care | Payer: Medicare Other | Source: Ambulatory Visit | Attending: Internal Medicine | Admitting: Internal Medicine

## 2019-12-16 DIAGNOSIS — Z79899 Other long term (current) drug therapy: Secondary | ICD-10-CM | POA: Diagnosis present

## 2019-12-16 DIAGNOSIS — F1011 Alcohol abuse, in remission: Secondary | ICD-10-CM

## 2019-12-16 DIAGNOSIS — I4819 Other persistent atrial fibrillation: Secondary | ICD-10-CM | POA: Diagnosis present

## 2019-12-16 DIAGNOSIS — R200 Anesthesia of skin: Secondary | ICD-10-CM

## 2019-12-16 LAB — CUP PACEART REMOTE DEVICE CHECK
Battery Remaining Longevity: 42 mo
Battery Voltage: 2.98 V
Brady Statistic AP VP Percent: 23.76 %
Brady Statistic AP VS Percent: 0.09 %
Brady Statistic AS VP Percent: 74 %
Brady Statistic AS VS Percent: 2.15 %
Brady Statistic RA Percent Paced: 20.88 %
Brady Statistic RV Percent Paced: 97.61 %
Date Time Interrogation Session: 20211122112511
Implantable Lead Implant Date: 20180124
Implantable Lead Implant Date: 20180124
Implantable Lead Location: 753859
Implantable Lead Location: 753860
Implantable Lead Model: 3830
Implantable Lead Model: 5076
Implantable Pulse Generator Implant Date: 20180124
Lead Channel Impedance Value: 342 Ohm
Lead Channel Impedance Value: 361 Ohm
Lead Channel Impedance Value: 361 Ohm
Lead Channel Impedance Value: 456 Ohm
Lead Channel Pacing Threshold Amplitude: 0.5 V
Lead Channel Pacing Threshold Amplitude: 0.75 V
Lead Channel Pacing Threshold Pulse Width: 0.4 ms
Lead Channel Pacing Threshold Pulse Width: 0.4 ms
Lead Channel Sensing Intrinsic Amplitude: 1.25 mV
Lead Channel Sensing Intrinsic Amplitude: 1.25 mV
Lead Channel Sensing Intrinsic Amplitude: 4.25 mV
Lead Channel Sensing Intrinsic Amplitude: 4.25 mV
Lead Channel Setting Pacing Amplitude: 2 V
Lead Channel Setting Pacing Amplitude: 2.25 V
Lead Channel Setting Pacing Pulse Width: 1 ms
Lead Channel Setting Sensing Sensitivity: 0.6 mV

## 2019-12-16 LAB — BASIC METABOLIC PANEL
Anion gap: 15 (ref 5–15)
BUN: 33 mg/dL — ABNORMAL HIGH (ref 8–23)
CO2: 30 mmol/L (ref 22–32)
Calcium: 9.5 mg/dL (ref 8.9–10.3)
Chloride: 91 mmol/L — ABNORMAL LOW (ref 98–111)
Creatinine, Ser: 1.95 mg/dL — ABNORMAL HIGH (ref 0.61–1.24)
GFR, Estimated: 35 mL/min — ABNORMAL LOW (ref >60)
Glucose, Bld: 159 mg/dL — ABNORMAL HIGH (ref 70–99)
Potassium: 3.3 mmol/L — ABNORMAL LOW (ref 3.5–5.1)
Sodium: 136 mmol/L (ref 135–145)

## 2019-12-16 MED ORDER — POTASSIUM CHLORIDE CRYS ER 20 MEQ PO TBCR: EXTENDED_RELEASE_TABLET | ORAL | 0 refills | Status: DC

## 2019-12-16 NOTE — Telephone Encounter (Signed)
I went ahead and scheduled a remote to assure it is completed automatically.

## 2019-12-16 NOTE — Telephone Encounter (Signed)
I have reviewed the patient's transmission and BMP results with Dr. Caryl Comes. BMP today showed: Sodium- 136 Potassium- 3.3 BUN- 33 Creatinine- 1.95  I have advised Dr. Caryl Comes of the 2 falls the patient had last week and 14 lb weight loss since his last office visit on 11/25/19. Per Dr. Caryl Comes, he is concerned the patient's BP may have dropped too low leading to his fall. I advised he was also following up with neurology today as well.  Orders received from Dr. Caryl Comes to: 1) Stop torsemide 2) Take potassium 20 meq- 2 tablets (40 meq) x 1 dose today 3) Allow his weight to come of 50 % of what he currently is (or come up ~ 7 lbs) and allow this to be his dry weight 4) repeat a BMP on Monday 11/29 5) send another transmission on Monday 11/29 to confirm if he is still in AF  I have called and notified the patient's wife of the above recommendations. She voices understanding and is agreeable.   She is aware I will touch base with her next week after the repeat BMP/ transmission are obtained.

## 2019-12-17 NOTE — Progress Notes (Signed)
Remote pacemaker transmission.   

## 2019-12-22 ENCOUNTER — Other Ambulatory Visit
Admission: RE | Admit: 2019-12-22 | Discharge: 2019-12-22 | Disposition: A | Discharge status: Home / Self Care | Payer: Medicare Other | Attending: Internal Medicine | Admitting: Internal Medicine

## 2019-12-22 DIAGNOSIS — I4819 Other persistent atrial fibrillation: Secondary | ICD-10-CM | POA: Insufficient documentation

## 2019-12-22 DIAGNOSIS — E876 Hypokalemia: Secondary | ICD-10-CM | POA: Insufficient documentation

## 2019-12-22 DIAGNOSIS — Z79899 Other long term (current) drug therapy: Secondary | ICD-10-CM | POA: Insufficient documentation

## 2019-12-22 LAB — BASIC METABOLIC PANEL
Anion gap: 12 (ref 5–15)
BUN: 21 mg/dL (ref 8–23)
CO2: 21 mmol/L — ABNORMAL LOW (ref 22–32)
Calcium: 9.8 mg/dL (ref 8.9–10.3)
Chloride: 102 mmol/L (ref 98–111)
Creatinine, Ser: 1.58 mg/dL — ABNORMAL HIGH (ref 0.61–1.24)
GFR, Estimated: 46 mL/min — ABNORMAL LOW (ref >60)
Glucose, Bld: 221 mg/dL — ABNORMAL HIGH (ref 70–99)
Potassium: 4.8 mmol/L (ref 3.5–5.1)
Sodium: 135 mmol/L (ref 135–145)

## 2019-12-22 NOTE — Telephone Encounter (Signed)
To Dr. Klein to review. 

## 2019-12-22 NOTE — Telephone Encounter (Signed)
The patient's BMP was repeated today. Sodium- 135 Potassium- 4.8 BUN- 21 Creatinine- 1.58  To [facility] to see if the patient's transmission was received today.

## 2019-12-22 NOTE — Telephone Encounter (Signed)
Manual transmission received.  Not in Af on presenting.  Looks like most recent AF episode ended very early this AM.

## 2019-12-23 NOTE — Telephone Encounter (Signed)
I spoke with the patient's wife. I have advised her of the patient's BMP results and interrogation report as per Dr. Caryl Comes.  The patient has been unsteady on his feet and weak per Mrs. 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  Otherwise, his breathing has been stable. He has not weighed as he feels unsure of getting on the scale with his balance issues.  I have advised Mrs. Fredin to have him continue to stay off the torsemide for now, but to let us know if the patient is having increased SOB/ lower extremity swelling/ feeling tight or full around his belly as we may need to use PRN torsemide at that time.  She is also aware to have the patient's BMP checked again in 2 weeks per Dr. Caryl Comes.  Mrs. Bentz voices understanding of the above recommendation and is agreeable.

## 2019-12-23 NOTE — Telephone Encounter (Signed)
Labs are beter but kidney function not yet at baseline Will recheck in about 2 weeks  Some interval Afib but as of 11/28 pm back in sinus   Hows is breathing following the diuresis

## 2019-12-26 ENCOUNTER — Other Ambulatory Visit: Payer: Self-pay

## 2019-12-26 ENCOUNTER — Ambulatory Visit
Admission: RE | Admit: 2019-12-26 | Discharge: 2019-12-26 | Disposition: A | Discharge status: Home / Self Care | Payer: Medicare Other | Source: Ambulatory Visit | Attending: Neurology | Admitting: Neurology

## 2019-12-26 DIAGNOSIS — R202 Paresthesia of skin: Secondary | ICD-10-CM | POA: Insufficient documentation

## 2019-12-26 DIAGNOSIS — R200 Anesthesia of skin: Secondary | ICD-10-CM | POA: Diagnosis present

## 2019-12-26 DIAGNOSIS — F1011 Alcohol abuse, in remission: Secondary | ICD-10-CM | POA: Insufficient documentation

## 2020-01-02 ENCOUNTER — Telehealth (INDEPENDENT_AMBULATORY_CARE_PROVIDER_SITE_OTHER): Payer: Medicare Other | Admitting: Nurse Practitioner

## 2020-01-02 ENCOUNTER — Encounter: Payer: Self-pay | Admitting: Nurse Practitioner

## 2020-01-02 VITALS — Temp 100.4°F

## 2020-01-02 DIAGNOSIS — R059 Cough, unspecified: Secondary | ICD-10-CM | POA: Diagnosis not present

## 2020-01-02 MED ORDER — FLUTICASONE PROPIONATE 50 MCG/ACT NA SUSP: 2.0000 | Freq: Every day | NASAL | 6 refills | Status: DC

## 2020-01-02 MED ORDER — PREDNISONE 20 MG PO TABS: 40.0000 mg | ORAL_TABLET | Freq: Every day | ORAL | 0 refills | Status: AC

## 2020-01-02 NOTE — Progress Notes (Signed)
Temp (!) 100.4 F (38 C)    Subjective:    Patient ID: Nicholas [name], male    DOB: Dec 02, 1945, 74 y.o.   MRN: [id number]  HPI: Nicholas [name] is a 74 y.o. male  Chief Complaint  Patient presents with  . Cough    All started about 4 days ago, all 4 grandkids and wife were all sick last week,   . chest congestion  . Nasal Congestion    . This visit was completed via telephone due to the restrictions of the COVID-19 pandemic. All issues as above were discussed and addressed but no physical exam was performed. If it was felt that the patient should be evaluated in the office, they were directed there. The patient verbally consented to this visit. Patient was unable to complete an audio/visual visit due to Technical difficulties,Lack of internet. Due to the catastrophic nature of the COVID-19 pandemic, this visit was done through audio contact only. . Location of the patient: home . Location of the provider: work . Those involved with this call:  . Provider: [name], DNP . CMA: Yvonna Alanis, CMA . Front Desk/Registration: Jill Side  . Time spent on call: 20 minutes on the phone discussing health concerns. 15 minutes total spent in review of patient's record and preparation of their chart.  . I verified patient identity using two factors (patient name and date of birth). Patient consents verbally to being seen via telemedicine visit today.    UPPER RESPIRATORY TRACT INFECTION Presents today for cough, nasal congestion, and chest congestion that started 4 days ago.  Was exposed to wife and 4 grandchildren who all had same symptoms -- all had negative Covid testing.  Last Covid vaccination was in March 2021.  Denies loss of taste or smell. Fever: yes Cough: yes Shortness of breath: no Wheezing: yes Chest pain: no Chest tightness: no Chest congestion: yes Nasal congestion: yes Runny nose: yes Post nasal drip: yes Sneezing: no Sore throat: no Swollen glands:  no Sinus pressure: yes Headache: no Face pain: no Toothache: no Ear pain: none Ear pressure: none Eyes red/itching:no Eye drainage/crusting: no  Vomiting: no Rash: no Fatigue: yes Sick contacts: yes Strep contacts: no  Context: fluctuating Recurrent sinusitis: no Relief with OTC cold/cough medications: yes  Treatments attempted: Dayquil and Tylenol  Relevant past medical, surgical, family and social history reviewed and updated as indicated. Interim medical history since our last visit reviewed. Allergies and medications reviewed and updated.  Review of Systems  Constitutional: Positive for chills, fatigue and fever. Negative for activity change and diaphoresis.  HENT: Positive for congestion, postnasal drip, rhinorrhea and sinus pressure. Negative for ear discharge, ear pain, sinus pain, sore throat and voice change.   Respiratory: Positive for cough and wheezing. Negative for chest tightness and shortness of breath.   Cardiovascular: Negative for chest pain, palpitations and leg swelling.  Gastrointestinal: Negative.   Musculoskeletal: Positive for myalgias.  Neurological: Negative.   Psychiatric/Behavioral: Negative.     Per HPI unless specifically indicated above     Objective:    Temp (!) 100.4 F (38 C)   Wt Readings from Last 3 Encounters:  11/25/19 281 lb (127.5 kg)  11/22/19 272 lb (123.4 kg)  11/10/19 270 lb (122.5 kg)    Physical Exam   Unable to perform due to telephone visit only.  Results for orders placed or performed during the hospital encounter of 78/93/81  Basic metabolic panel  Result Value Ref Range  Sodium 135 135 - 145 mmol/L   Potassium 4.8 3.5 - 5.1 mmol/L   Chloride 102 98 - 111 mmol/L   CO2 21 (L) 22 - 32 mmol/L   Glucose, Bld 221 (H) 70 - 99 mg/dL   BUN 21 8 - 23 mg/dL   Creatinine, Ser 1.58 (H) 0.61 - 1.24 mg/dL   Calcium 9.8 8.9 - 10.3 mg/dL   GFR, Estimated 46 (L) >60 mL/min   Anion gap 12 5 - 15      Assessment & Plan:    Problem List Items Addressed This Visit      Other   Cough - Primary    Acute and present x 4 days after exposure to grandchildren and wife who all tested negative for Covid.  Recommend he obtain Covid testing due to symptoms, including fever being present -- test ordered.  Concern for Covid.  Recommend he self quarantine until results have returned and symptoms have improved.  Is on day 4 of symptoms, so no abx regimen at this time, discussed with him this is most likely more viral which abx do not assist with.  If lingers for >7 days then consider abx and return to office.  At this time will send in script for Prednisone burst and Flonase to help with symptoms.  Recommend avoiding Dayquil and start Coricidin, due to his HTN and heart health.  May also take Tylenol as needed for fever.  Return to office for any worsening or ongoing symptoms.      Relevant Orders   Novel Coronavirus, NAA ([facility])      I discussed the assessment and treatment plan with the patient. The patient was provided an opportunity to ask questions and all were answered. The patient agreed with the plan and demonstrated an understanding of the instructions.   The patient was advised to call back or seek an in-person evaluation if the symptoms worsen or if the condition fails to improve as anticipated.   I provided 21+ minutes of time during this encounter.  Follow up plan: Return if symptoms worsen or fail to improve.

## 2020-01-02 NOTE — Patient Instructions (Signed)
COVID-19 COVID-19 is a respiratory infection that is caused by a virus called severe acute respiratory syndrome coronavirus 2 (SARS-CoV-2). The disease is also known as coronavirus disease or novel coronavirus. In some people, the virus may not cause any symptoms. In others, it may cause a serious infection. The infection can get worse quickly and can lead to complications, such as:  Pneumonia, or infection of the lungs.  Acute respiratory distress syndrome or ARDS. This is a condition in which fluid build-up in the lungs prevents the lungs from filling with air and passing oxygen into the blood.  Acute respiratory failure. This is a condition in which there is not enough oxygen passing from the lungs to the body or when carbon dioxide is not passing from the lungs out of the body.  Sepsis or septic shock. This is a serious bodily reaction to an infection.  Blood clotting problems.  Secondary infections due to bacteria or fungus.  Organ failure. This is when your body's organs stop working. The virus that causes COVID-19 is contagious. This means that it can spread from person to person through droplets from coughs and sneezes (respiratory secretions). What are the causes? This illness is caused by a virus. You may catch the virus by:  Breathing in droplets from an infected person. Droplets can be spread by a person breathing, speaking, singing, coughing, or sneezing.  Touching something, like a table or a doorknob, that was exposed to the virus (contaminated) and then touching your mouth, nose, or eyes. What increases the risk? Risk for infection You are more likely to be infected with this virus if you:  Are within 6 feet (2 meters) of a person with COVID-19.  Provide care for or live with a person who is infected with COVID-19.  Spend time in crowded indoor spaces or live in shared housing. Risk for serious illness You are more likely to become seriously ill from the virus if you:   Are 50 years of age or older. The higher your age, the more you are at risk for serious illness.  Live in a nursing home or long-term care facility.  Have cancer.  Have a long-term (chronic) disease such as: ? Chronic lung disease, including chronic obstructive pulmonary disease or asthma. ? A long-term disease that lowers your body's ability to fight infection (immunocompromised). ? Heart disease, including heart failure, a condition in which the arteries that lead to the heart become narrow or blocked (coronary artery disease), a disease which makes the heart muscle thick, weak, or stiff (cardiomyopathy). ? Diabetes. ? Chronic kidney disease. ? Sickle cell disease, a condition in which red blood cells have an abnormal "sickle" shape. ? Liver disease.  Are obese. What are the signs or symptoms? Symptoms of this condition can range from mild to severe. Symptoms may appear any time from 2 to 14 days after being exposed to the virus. They include:  A fever or chills.  A cough.  Difficulty breathing.  Headaches, body aches, or muscle aches.  Runny or stuffy (congested) nose.  A sore throat.  New loss of taste or smell. Some people may also have stomach problems, such as nausea, vomiting, or diarrhea. Other people may not have any symptoms of COVID-19. How is this diagnosed? This condition may be diagnosed based on:  Your signs and symptoms, especially if: ? You live in an area with a COVID-19 outbreak. ? You recently traveled to or from an area where the virus is common. ? You   provide care for or live with a person who was diagnosed with COVID-19. ? You were exposed to a person who was diagnosed with COVID-19.  A physical exam.  Lab tests, which may include: ? Taking a sample of fluid from the back of your nose and throat (nasopharyngeal fluid), your nose, or your throat using a swab. ? A sample of mucus from your lungs (sputum). ? Blood tests.  Imaging tests, which  may include, X-rays, CT scan, or ultrasound. How is this treated? At present, there is no medicine to treat COVID-19. Medicines that treat other diseases are being used on a trial basis to see if they are effective against COVID-19. Your health care provider will talk with you about ways to treat your symptoms. For most people, the infection is mild and can be managed at home with rest, fluids, and over-the-counter medicines. Treatment for a serious infection usually takes places in a hospital intensive care unit (ICU). It may include one or more of the following treatments. These treatments are given until your symptoms improve.  Receiving fluids and medicines through an IV.  Supplemental oxygen. Extra oxygen is given through a tube in the nose, a face mask, or a hood.  Positioning you to lie on your stomach (prone position). This makes it easier for oxygen to get into the lungs.  Continuous positive airway pressure (CPAP) or bi-level positive airway pressure (BPAP) machine. This treatment uses mild air pressure to keep the airways open. A tube that is connected to a motor delivers oxygen to the body.  Ventilator. This treatment moves air into and out of the lungs by using a tube that is placed in your windpipe.  Tracheostomy. This is a procedure to create a hole in the neck so that a breathing tube can be inserted.  Extracorporeal membrane oxygenation (ECMO). This procedure gives the lungs a chance to recover by taking over the functions of the heart and lungs. It supplies oxygen to the body and removes carbon dioxide. Follow these instructions at home: Lifestyle  If you are sick, stay home except to get medical care. Your health care provider will tell you how long to stay home. Call your health care provider before you go for medical care.  Rest at home as told by your health care provider.  Do not use any products that contain nicotine or tobacco, such as cigarettes, e-cigarettes, and  chewing tobacco. If you need help quitting, ask your health care provider.  Return to your normal activities as told by your health care provider. Ask your health care provider what activities are safe for you. General instructions  Take over-the-counter and prescription medicines only as told by your health care provider.  Drink enough fluid to keep your urine pale yellow.  Keep all follow-up visits as told by your health care provider. This is important. How is this prevented?  There is no vaccine to help prevent COVID-19 infection. However, there are steps you can take to protect yourself and others from this virus. To protect yourself:   Do not travel to areas where COVID-19 is a risk. The areas where COVID-19 is reported change often. To identify high-risk areas and travel restrictions, check the CDC travel website: wwwnc.cdc.gov/travel/notices  If you live in, or must travel to, an area where COVID-19 is a risk, take precautions to avoid infection. ? Stay away from people who are sick. ? Wash your hands often with soap and water for 20 seconds. If soap and water   are not available, use an alcohol-based hand sanitizer. ? Avoid touching your mouth, face, eyes, or nose. ? Avoid going out in public, follow guidance from your state and local health authorities. ? If you must go out in public, wear a cloth face covering or face mask. Make sure your mask covers your nose and mouth. ? Avoid crowded indoor spaces. Stay at least 6 feet (2 meters) away from others. ? Disinfect objects and surfaces that are frequently touched every day. This may include:  Counters and tables.  Doorknobs and light switches.  Sinks and faucets.  Electronics, such as phones, remote controls, keyboards, computers, and tablets. To protect others: If you have symptoms of COVID-19, take steps to prevent the virus from spreading to others.  If you think you have a COVID-19 infection, contact your health care  provider right away. Tell your health care team that you think you may have a COVID-19 infection.  Stay home. Leave your house only to seek medical care. Do not use public transport.  Do not travel while you are sick.  Wash your hands often with soap and water for 20 seconds. If soap and water are not available, use alcohol-based hand sanitizer.  Stay away from other members of your household. Let healthy household members care for children and pets, if possible. If you have to care for children or pets, wash your hands often and wear a mask. If possible, stay in your own room, separate from others. Use a different bathroom.  Make sure that all people in your household wash their hands well and often.  Cough or sneeze into a tissue or your sleeve or elbow. Do not cough or sneeze into your hand or into the air.  Wear a cloth face covering or face mask. Make sure your mask covers your nose and mouth. Where to find more information  Centers for Disease Control and Prevention: www.cdc.gov/coronavirus/2019-ncov/index.html  World Health Organization: www.who.int/health-topics/coronavirus Contact a health care provider if:  You live in or have traveled to an area where COVID-19 is a risk and you have symptoms of the infection.  You have had contact with someone who has COVID-19 and you have symptoms of the infection. Get help right away if:  You have trouble breathing.  You have pain or pressure in your chest.  You have confusion.  You have bluish lips and fingernails.  You have difficulty waking from sleep.  You have symptoms that get worse. These symptoms may represent a serious problem that is an emergency. Do not wait to see if the symptoms will go away. Get medical help right away. Call your local emergency services (911 in the [place] Do not drive yourself to the hospital. Let the emergency medical personnel know if you think you have COVID-19. Summary  COVID-19 is a  respiratory infection that is caused by a virus. It is also known as coronavirus disease or novel coronavirus. It can cause serious infections, such as pneumonia, acute respiratory distress syndrome, acute respiratory failure, or sepsis.  The virus that causes COVID-19 is contagious. This means that it can spread from person to person through droplets from breathing, speaking, singing, coughing, or sneezing.  You are more likely to develop a serious illness if you are 50 years of age or older, have a weak immune system, live in a nursing home, or have chronic disease.  There is no medicine to treat COVID-19. Your health care provider will talk with you about ways to treat your symptoms.    Take steps to protect yourself and others from infection. Wash your hands often and disinfect objects and surfaces that are frequently touched every day. Stay away from people who are sick and wear a mask if you are sick. This information is not intended to replace advice given to you by your health care provider. Make sure you discuss any questions you have with your health care provider. Document Revised: 11/08/2018 Document Reviewed: 02/14/2018 Elsevier Patient Education  2020 [employer]  

## 2020-01-02 NOTE — Assessment & Plan Note (Signed)
Acute and present x 4 days after exposure to grandchildren and wife who all tested negative for Covid.  Recommend he obtain Covid testing due to symptoms, including fever being present -- test ordered.  Concern for Covid.  Recommend he self quarantine until results have returned and symptoms have improved.  Is on day 4 of symptoms, so no abx regimen at this time, discussed with him this is most likely more viral which abx do not assist with.  If lingers for >7 days then consider abx and return to office.  At this time will send in script for Prednisone burst and Flonase to help with symptoms.  Recommend avoiding Dayquil and start Coricidin, due to his HTN and heart health.  May also take Tylenol as needed for fever.  Return to office for any worsening or ongoing symptoms.

## 2020-01-02 NOTE — Addendum Note (Signed)
Addended by: Georgina Peer on: 01/02/2020 04:06 PM   Modules accepted: Orders

## 2020-01-03 LAB — NOVEL CORONAVIRUS, NAA: SARS-CoV-2, NAA: NOT DETECTED

## 2020-01-03 LAB — SARS-COV-2, NAA 2 DAY TAT

## 2020-01-05 ENCOUNTER — Other Ambulatory Visit
Admission: RE | Admit: 2020-01-05 | Discharge: 2020-01-05 | Disposition: A | Discharge status: Home / Self Care | Payer: Medicare Other | Source: Ambulatory Visit | Attending: Internal Medicine | Admitting: Internal Medicine

## 2020-01-05 DIAGNOSIS — I4819 Other persistent atrial fibrillation: Secondary | ICD-10-CM | POA: Diagnosis present

## 2020-01-05 DIAGNOSIS — Z79899 Other long term (current) drug therapy: Secondary | ICD-10-CM | POA: Diagnosis present

## 2020-01-05 LAB — BASIC METABOLIC PANEL
Anion gap: 10 (ref 5–15)
BUN: 22 mg/dL (ref 8–23)
CO2: 28 mmol/L (ref 22–32)
Calcium: 9.5 mg/dL (ref 8.9–10.3)
Chloride: 102 mmol/L (ref 98–111)
Creatinine, Ser: 1.12 mg/dL (ref 0.61–1.24)
GFR, Estimated: >60 mL/min (ref >60)
Glucose, Bld: 183 mg/dL — ABNORMAL HIGH (ref 70–99)
Potassium: 4.7 mmol/L (ref 3.5–5.1)
Sodium: 140 mmol/L (ref 135–145)

## 2020-01-05 NOTE — Progress Notes (Signed)
Contacted via MyChart   Good morning Nicholas [name], your Covid testing returned negative!!  Good news.

## 2020-01-08 ENCOUNTER — Ambulatory Visit (INDEPENDENT_AMBULATORY_CARE_PROVIDER_SITE_OTHER): Payer: Medicare Other | Admitting: Unknown Physician Specialty

## 2020-01-08 ENCOUNTER — Encounter: Payer: Self-pay | Admitting: Unknown Physician Specialty

## 2020-01-08 VITALS — BP 129/64 | HR 62 | Temp 98.4°F

## 2020-01-08 DIAGNOSIS — R0902 Hypoxemia: Secondary | ICD-10-CM | POA: Diagnosis not present

## 2020-01-08 DIAGNOSIS — J189 Pneumonia, unspecified organism: Secondary | ICD-10-CM | POA: Diagnosis not present

## 2020-01-08 MED ORDER — AZITHROMYCIN 250 MG PO TABS: ORAL_TABLET | ORAL | 0 refills | Status: DC

## 2020-01-08 MED ORDER — AMOXICILLIN 875 MG PO TABS: 875.0000 mg | ORAL_TABLET | Freq: Two times a day (BID) | ORAL | 0 refills | Status: DC

## 2020-01-08 MED ORDER — PREDNISONE 20 MG PO TABS: 40.0000 mg | ORAL_TABLET | Freq: Every day | ORAL | 0 refills | Status: DC

## 2020-01-08 NOTE — Progress Notes (Signed)
BP 129/64   Pulse 62   Temp 98.4 F (36.9 C) (Oral)    Subjective:    Patient ID: Nicholas [name], male    DOB: [birth year], 74 y.o.   MRN: [id number]  HPI: Nicholas [name] is a 74 y.o. male  Chief Complaint  Patient presents with  . Cough    Started over a week ago.  . Fever  . post nasal drip  . head congestion  . chest congestion   This visit was completed via telephone due to the restrictions of the COVID-19 pandemic. All issues as above were discussed and addressed but no physical exam was performed. If it was felt that the patient should be evaluated in the office, they were directed there. The patient verbally consented to this visit. Patient was unable to complete an audio/visual visit due to Lack of equipment. . Location of the patient: home . Location of the provider: work . Those involved with this call:  . Provider: Kathrine Haddock, DNP . CMA: Yvonna Alanis, CMA . Front Desk/Registration: Jill Side  . Time spent on call: 10 minutes on the phone discussing health concerns. 15 minutes total spent in review of patient's record and preparation of their chart.  I verified patient identity using two factors (patient name and date of birth). Patient consents verbally to being seen via telemedicine visit today.   Cough This is a new (Covid test done last week.  2 people in house had pneumonia and Covid negative.  ) problem. Episode onset: 7 days ago. The problem has been gradually improving. The cough is non-productive. Associated symptoms include a fever, nasal congestion, postnasal drip, rhinorrhea and shortness of breath. Pertinent negatives include no chills or sweats. Associated symptoms comments: Fever of 100.4. Nothing aggravates the symptoms. He has tried OTC cough suppressant for the symptoms. The treatment provided no relief. There is no history of asthma, bronchiectasis or bronchitis.  Fever  Associated symptoms include coughing.     Relevant past medical,  surgical, family and social history reviewed and updated as indicated. Interim medical history since our last visit reviewed. Allergies and medications reviewed and updated.  Review of Systems  Constitutional: Positive for fever. Negative for chills.  HENT: Positive for postnasal drip and rhinorrhea.   Respiratory: Positive for cough and shortness of breath.     Per HPI unless specifically indicated above     Objective:    BP 129/64   Pulse 62   Temp 98.4 F (36.9 C) (Oral)   Wt Readings from Last 3 Encounters:  11/25/19 281 lb (127.5 kg)  11/22/19 272 lb (123.4 kg)  11/10/19 270 lb (122.5 kg)    Pulse ox: 94%  Physical Exam Neurological:     Mental Status: He is alert.  Psychiatric:        Mood and Affect: Mood normal.    Assessment & Plan:   Problem List Items Addressed This Visit   None   Visit Diagnoses    Pneumonia due to infectious organism, unspecified laterality, unspecified part of lung    -  Primary   Suspect CAP as other members in the household sick.  Encouraged to go the the ER with multiple co-morbid conditions.  Will rx with 2 abs per CAP guidelines   Relevant Medications   amoxicillin (AMOXIL) 875 MG tablet   azithromycin (ZITHROMAX Z-PAK) 250 MG tablet   Hypoxia       Pulse ox was 86% but came up to 94% (last  was 99%).  Will use nebulizer they have at home.  Refuses ER.Prednosone burst         Follow up plan: Return if symptoms worsen or fail to improve.

## 2020-02-02 ENCOUNTER — Ambulatory Visit: Payer: Medicare Other | Attending: Anesthesiology | Admitting: Anesthesiology

## 2020-02-02 ENCOUNTER — Other Ambulatory Visit: Payer: Self-pay

## 2020-02-02 ENCOUNTER — Encounter: Payer: Self-pay | Admitting: Anesthesiology

## 2020-02-02 DIAGNOSIS — M5136 Other intervertebral disc degeneration, lumbar region: Secondary | ICD-10-CM

## 2020-02-02 DIAGNOSIS — M5416 Radiculopathy, lumbar region: Secondary | ICD-10-CM | POA: Diagnosis not present

## 2020-02-02 DIAGNOSIS — M1611 Unilateral primary osteoarthritis, right hip: Secondary | ICD-10-CM

## 2020-02-02 DIAGNOSIS — M79641 Pain in right hand: Secondary | ICD-10-CM

## 2020-02-02 DIAGNOSIS — M5431 Sciatica, right side: Secondary | ICD-10-CM

## 2020-02-02 DIAGNOSIS — M79642 Pain in left hand: Secondary | ICD-10-CM

## 2020-02-02 DIAGNOSIS — M47816 Spondylosis without myelopathy or radiculopathy, lumbar region: Secondary | ICD-10-CM

## 2020-02-02 DIAGNOSIS — M5432 Sciatica, left side: Secondary | ICD-10-CM

## 2020-02-02 DIAGNOSIS — F1190 Opioid use, unspecified, uncomplicated: Secondary | ICD-10-CM

## 2020-02-02 DIAGNOSIS — G894 Chronic pain syndrome: Secondary | ICD-10-CM | POA: Diagnosis not present

## 2020-02-02 MED ORDER — HYDROCODONE-ACETAMINOPHEN 5-325 MG PO TABS: 1.0000 | ORAL_TABLET | Freq: Two times a day (BID) | ORAL | 0 refills | Status: DC | PRN

## 2020-02-02 MED ORDER — HYDROCODONE-ACETAMINOPHEN 5-325 MG PO TABS: 1.0000 | ORAL_TABLET | Freq: Two times a day (BID) | ORAL | 0 refills | Status: AC | PRN

## 2020-02-02 NOTE — Progress Notes (Signed)
Virtual Visit via Telephone Note  I connected with Nicholas [name] on 02/02/20 at 12:30 PM EST by telephone and verified that I am speaking with the correct person using two identifiers.  Location: Patient: Home Provider: Pain control center   I discussed the limitations, risks, security and privacy concerns of performing an evaluation and management service by telephone and the availability of in person appointments. I also discussed with the patient that there may be a patient responsible charge related to this service. The patient expressed understanding and agreed to proceed.   History of Present Illness: I spoke with Nicholas [name] today regarding his low back pain and leg pain.  We have to do this via telephone as he was unable to do the video portion of the virtual conference but he reports that his pain has been under good control with his current opioid medicine regimen.  He is taking his hydrocodone twice a day.  This keeps his pain under good control knocks out about 75 to 80% of pain for about 6 hours when he takes his medicines.  Enables him to stay active.  Unfortunately he has been sidelined by some recent problems with dizziness.  This currently under evaluation.  He is seeing a neurologist for this and they are working to further determine the cause.  He tolerates his opioid medications without difficulty and has no association with the dizziness with the administration of his opioids.  His bowel bladder function has been good and he utilizes a cane at present for ambulation.  The dizziness is primarily present when he goes from seated to standing.  Otherwise he is in his usual state of health.   Observations/Objective:   Current Outpatient Medications:  .  [START ON 03/11/2020] HYDROcodone-acetaminophen (NORCO/VICODIN) 5-325 MG tablet, Take 1 tablet by mouth 2 (two) times daily as needed for moderate pain or severe pain., Disp: 60 tablet, Rfl: 0 .  acetaminophen (TYLENOL) 650 MG CR  tablet, Take 650 mg by mouth daily. , Disp: , Rfl:  .  amoxicillin (AMOXIL) 875 MG tablet, Take 1 tablet (875 mg total) by mouth 2 (two) times daily., Disp: 20 tablet, Rfl: 0 .  apixaban (ELIQUIS) 5 MG TABS tablet, Take 5 mg by mouth 2 (two) times daily., Disp: , Rfl:  .  ascorbic acid (VITAMIN C) 250 MG tablet, Take 1,000 mg by mouth daily. , Disp: , Rfl:  .  azithromycin (ZITHROMAX Z-PAK) 250 MG tablet, As directed, Disp: 6 each, Rfl: 0 .  Cranberry 500 MG CAPS, Take 1 capsule by mouth daily. , Disp: , Rfl:  .  ferrous sulfate 325 (65 FE) MG tablet, Take 325 mg by mouth daily with breakfast., Disp: , Rfl:  .  flecainide (TAMBOCOR) 100 MG tablet, Take 100 mg by mouth 2 (two) times daily., Disp: , Rfl:  .  fluticasone (FLONASE) 50 MCG/ACT nasal spray, Place 2 sprays into both nostrils daily., Disp: 16 g, Rfl: 6 .  gabapentin (NEURONTIN) 600 MG tablet, Take 1 tablet (600 mg total) by mouth at bedtime., Disp: 90 tablet, Rfl: 4 .  [START ON 02/10/2020] HYDROcodone-acetaminophen (NORCO/VICODIN) 5-325 MG tablet, Take 1 tablet by mouth 2 (two) times daily as needed., Disp: 60 tablet, Rfl: 0 .  hyoscyamine (LEVSIN, ANASPAZ) 0.125 MG tablet, Take 0.125 mg by mouth every 6 (six) hours as needed for bladder spasms or cramping. , Disp: , Rfl:  .  LORazepam (ATIVAN) 1 MG tablet, Take 1 tablet (1 mg total) by mouth at bedtime.,  Disp: 90 tablet, Rfl: 0 .  Magnesium 400 MG TABS, Take 400 mg by mouth daily. , Disp: , Rfl:  .  Melatonin 10 MG TABS, Take 10 mg by mouth at bedtime. , Disp: , Rfl:  .  methocarbamol (ROBAXIN) 500 MG tablet, Take 1 tablet (500 mg total) by mouth 2 (two) times daily as needed for muscle spasms., Disp: 60 tablet, Rfl: 2 .  midodrine (PROAMATINE) 5 MG tablet, TAKE 1 TABLET (5 MG) IN THE MORNING AFTER WAKING UP AND TAKE 1 TABLET (5 MG) IN THE AFTERNOON AFTER YOU NAP, Disp: 180 tablet, Rfl: 2 .  Multiple Vitamin (MULTI-VITAMINS) TABS, Take 1 tablet by mouth daily. , Disp: , Rfl:  .  Omega-3  Fatty Acids (FISH OIL) 1200 MG CAPS, Take 1,200 mg by mouth daily., Disp: , Rfl:  .  oxymetazoline (AFRIN) 0.05 % nasal spray, Place 2 sprays into both nostrils at bedtime as needed for congestion., Disp: , Rfl:  .  pantoprazole (PROTONIX) 20 MG tablet, Take 1 tablet (20 mg total) by mouth daily., Disp: 90 tablet, Rfl: 4 .  potassium chloride SA (KLOR-CON) 20 MEQ tablet, Take 2 tablets (40 meq) x 1 dose as directed, Disp: 10 tablet, Rfl: 0 .  predniSONE (DELTASONE) 20 MG tablet, Take 2 tablets (40 mg total) by mouth daily with breakfast., Disp: 10 tablet, Rfl: 0 .  propafenone (RYTHMOL) 225 MG tablet, Take 1 tablet (225 mg total) by mouth 2 (two) times daily., Disp: 60 tablet, Rfl: 6 .  QUEtiapine Fumarate (SEROQUEL XR) 150 MG 24 hr tablet, TAKE 1 TABLET (150 MG TOTAL) BY MOUTH AT BEDTIME., Disp: 90 tablet, Rfl: 4 .  rosuvastatin (CRESTOR) 20 MG tablet, Take 1 tablet (20 mg total) by mouth daily., Disp: 30 tablet, Rfl: 6 .  solifenacin (VESICARE) 10 MG tablet, Take 1 tablet (10 mg total) by mouth daily., Disp: 90 tablet, Rfl: 4 .  torsemide (DEMADEX) 20 MG tablet, Take 1 tablet (20 mg) by mouth once daily x 5 days, then take 1 tablet daily as needed for increased shortness of breath/ swelling, Disp: 30 tablet, Rfl: 3 .  triamcinolone cream (KENALOG) 0.1 %, Apply 1 application topically 2 (two) times daily. , Disp: , Rfl:  .  venlafaxine XR (EFFEXOR XR) 75 MG 24 hr capsule, Take 3 capsules (225 mg total) by mouth daily., Disp: 270 capsule, Rfl: 4 Assessment and Plan:  1. Lumbar radiculopathy   2. Degeneration of lumbar intervertebral disc   3. Chronic, continuous use of opioids   4. Chronic pain syndrome   5. Bilateral hand pain   6. Facet syndrome, lumbar   7. Bilateral sciatica   8. Osteoarthritis of right hip, unspecified osteoarthritis type   Based on our discussion today and upon review of the [facility] practitioner database information ongoing to refill his medications for January 18  and February 17.  No other changes will be made in his opioid protocol or pain management.  I have encouraged him to continue follow-up with his neurologist for further evaluation and we will schedule him for 51-month return to clinic.  No other changes will be initiated today.  He will be scheduled for return to clinic in 2 months Follow Up Instructions:    I discussed the assessment and treatment plan with the patient. The patient was provided an opportunity to ask questions and all were answered. The patient agreed with the plan and demonstrated an understanding of the instructions.   The patient was advised to  call back or seek an in-person evaluation if the symptoms worsen or if the condition fails to improve as anticipated.  I provided 25 minutes of non-face-to-face time during this encounter.   [name], MD

## 2020-02-07 ENCOUNTER — Encounter: Payer: Self-pay | Admitting: Nurse Practitioner

## 2020-02-07 DIAGNOSIS — I714 Abdominal aortic aneurysm, without rupture, unspecified: Secondary | ICD-10-CM | POA: Insufficient documentation

## 2020-02-07 DIAGNOSIS — D6869 Other thrombophilia: Secondary | ICD-10-CM | POA: Insufficient documentation

## 2020-02-11 ENCOUNTER — Other Ambulatory Visit: Payer: Self-pay

## 2020-02-11 ENCOUNTER — Ambulatory Visit (INDEPENDENT_AMBULATORY_CARE_PROVIDER_SITE_OTHER): Payer: Medicare Other | Admitting: Nurse Practitioner

## 2020-02-11 ENCOUNTER — Encounter: Payer: Self-pay | Admitting: Nurse Practitioner

## 2020-02-11 VITALS — BP 92/65 | HR 92 | Temp 97.6°F | Wt 275.8 lb

## 2020-02-11 DIAGNOSIS — E785 Hyperlipidemia, unspecified: Secondary | ICD-10-CM

## 2020-02-11 DIAGNOSIS — I714 Abdominal aortic aneurysm, without rupture, unspecified: Secondary | ICD-10-CM

## 2020-02-11 DIAGNOSIS — N1831 Chronic kidney disease, stage 3a: Secondary | ICD-10-CM | POA: Diagnosis not present

## 2020-02-11 DIAGNOSIS — I10 Essential (primary) hypertension: Secondary | ICD-10-CM

## 2020-02-11 DIAGNOSIS — D6869 Other thrombophilia: Secondary | ICD-10-CM

## 2020-02-11 DIAGNOSIS — F339 Major depressive disorder, recurrent, unspecified: Secondary | ICD-10-CM | POA: Diagnosis not present

## 2020-02-11 DIAGNOSIS — R300 Dysuria: Secondary | ICD-10-CM

## 2020-02-11 DIAGNOSIS — I442 Atrioventricular block, complete: Secondary | ICD-10-CM

## 2020-02-11 DIAGNOSIS — I951 Orthostatic hypotension: Secondary | ICD-10-CM

## 2020-02-11 DIAGNOSIS — F419 Anxiety disorder, unspecified: Secondary | ICD-10-CM

## 2020-02-11 DIAGNOSIS — R7301 Impaired fasting glucose: Secondary | ICD-10-CM

## 2020-02-11 DIAGNOSIS — R8281 Pyuria: Secondary | ICD-10-CM

## 2020-02-11 DIAGNOSIS — I700 Atherosclerosis of aorta: Secondary | ICD-10-CM

## 2020-02-11 DIAGNOSIS — G894 Chronic pain syndrome: Secondary | ICD-10-CM

## 2020-02-11 DIAGNOSIS — Z125 Encounter for screening for malignant neoplasm of prostate: Secondary | ICD-10-CM

## 2020-02-11 LAB — URINALYSIS, ROUTINE W REFLEX MICROSCOPIC
Bilirubin, UA: NEGATIVE
Glucose, UA: NEGATIVE
Ketones, UA: NEGATIVE
Nitrite, UA: NEGATIVE
Protein,UA: NEGATIVE
RBC, UA: NEGATIVE
Specific Gravity, UA: 1.01 (ref 1.005–1.030)
Urobilinogen, Ur: 0.2 mg/dL (ref 0.2–1.0)
pH, UA: 5.5 (ref 5.0–7.5)

## 2020-02-11 LAB — MICROSCOPIC EXAMINATION: RBC, Urine: NONE SEEN /hpf (ref 0–2)

## 2020-02-11 LAB — BAYER DCA HB A1C WAIVED: HB A1C (BAYER DCA - WAIVED): 5.9 % (ref <7.0)

## 2020-02-11 LAB — MICROALBUMIN, URINE WAIVED
Creatinine, Urine Waived: 50 mg/dL (ref 10–300)
Microalb, Ur Waived: 30 mg/L — ABNORMAL HIGH (ref 0–19)

## 2020-02-11 MED ORDER — LORAZEPAM 1 MG PO TABS: 1.0000 mg | ORAL_TABLET | Freq: Every day | ORAL | 0 refills | Status: DC

## 2020-02-11 NOTE — Assessment & Plan Note (Signed)
On Eliquis with A-fib.  Continue to monitor closely for bleeding or increased bruising.  CBC annually. 

## 2020-02-11 NOTE — Assessment & Plan Note (Signed)
Chronic, ongoing.  Followed by cardiology.  BP below goal today in office.  With orthostatic BP presenting occasionally will continue current regimen at this time and have recommended utilizing compression hose at home, agrees to try this.  Continue collaboration with cardiology and current medication regimen.  Labs today to include BMP.

## 2020-02-11 NOTE — Assessment & Plan Note (Signed)
Chronic, ongoing followed by pain clinic.  Discussed at length risk of benzo and opioid use in conjunction with each other.  Recommend he not take the two together at same hour during day or evening.

## 2020-02-11 NOTE — Assessment & Plan Note (Signed)
Chronic, ongoing.  Continue current medication regimen and adjust as needed. Lipid panel today. 

## 2020-02-11 NOTE — Assessment & Plan Note (Signed)
Noted on past labs -- A1c today downward trend to 5.9% and urine ALB 30.  Concern for T2DM and discussed with wife and patient.  Initiate medication as needed and continue to collaborate with CCM team.

## 2020-02-11 NOTE — Assessment & Plan Note (Signed)
Noted on past imaging, continue statin and BP control for prevention. 

## 2020-02-11 NOTE — Assessment & Plan Note (Signed)
Chronic, ongoing.  Denies SI/HI.  Continue current medication regimen and adjust as needed.  Would benefit from trial off Effexor and trial of SSRI, but refuses this.   

## 2020-02-11 NOTE — Assessment & Plan Note (Addendum)
Chronic, ongoing.  Discussed at length risk of benzo and opioid use in conjunction with each other.  Recommend he not take the two together at same hour during day or evening.  He tried to cut back to 1/2 tablet but was unsuccessful in past.   Continue to collaborate with CCM team on education.  Provided wife and him with [facility] of [facility] ben[facility] risk information patient sheet in past.  They request 90 day supply, as previous PCP supplied this.  Are aware he does have to return every 3 months for refills.  UDS up to date with pain management in August 2021.  Refills sent.

## 2020-02-11 NOTE — Assessment & Plan Note (Signed)
BMI 38.47 with HTN, A-Fib, Heart Block, and CKD.  Recommended eating smaller high protein, low fat meals more frequently and exercising 30 mins a day 5 times a week with a goal of 10-15lb weight loss in the next 3 months. Patient voiced their understanding and motivation to adhere to these recommendations.

## 2020-02-11 NOTE — Assessment & Plan Note (Signed)
Followed by cardiology.  Pacemaker checks by them.

## 2020-02-11 NOTE — Patient Instructions (Signed)

## 2020-02-11 NOTE — Progress Notes (Signed)
BP 92/65 (BP Location: Left Arm)   Pulse 92   Temp 97.6 F (36.4 C)   Wt 275 lb 12.8 oz (125.1 kg)   SpO2 98%   BMI 38.47 kg/m    Subjective:    Patient ID: Nicholas [name], male    DOB: 09/30/1945, 75 y.o.   MRN: [id number]  HPI: Nicholas [name] is a 75 y.o. male  Chief Complaint  Patient presents with  . Hypertension  . IFG  . Urinary Tract Infection    Pt wife states patient was having frequent urination and unable to hold his urine sometimes    Wife present at bedside with patient.  HYPERTENSION / HYPERLIPIDEMIA Followed by Dr. Caryl Comes and last seen 11/25/19, was started on ProAmatine due to dizziness and occasional dizziness with orthostatic BPs. 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 Does endorse having occasional episodes over past year or so of dizziness when changing positions from sitting to standing. Does not wear compression hose, refuses to wear these.   Continues on Rosuvastatin, Eliquis, fish oil. Satisfied with current treatment?yes Duration of hypertension:chronic BP monitoring frequency:daily BP range:130/70-80 BP medication side effects:no Duration of hyperlipidemia:chronic Cholesterol medication side effects:no Cholesterol supplements: fish oil Medication compliance:good compliance Aspirin:no Recent stressors:no Recurrent headaches:no Visual changes:no Palpitations:no Dyspnea:no Chest pain:no Lower extremity edema:no Dizzy/lightheaded:occasional  IFG: Recent A1c 6.3% October.  Polydipsia/polyuria: no Visual disturbance: no Chest pain: no Paresthesias: no   CHRONIC KIDNEY DISEASE Noted on cardiology labs last year with GFR 47 and CRT 1.46, BUN 21 recent labs from 01/05/20 show improvement with GFR >60 and CRT 1.12.  Discussed with patient and his wife. CKD status: stable Medications renally dose: yes Previous renal evaluation: no Pneumovax:  Up to Date Influenza Vaccine:  Up to Date  DEPRESSION Is on  Effexor, Seroquel, and Ativan. Pt and his wife at bedside Dr [facility] of risks of benzomedication use to include increased sedation, respiratory suppression, falls, extrapyramidal movements, dependence and cardiovascular events. Pt and his wifewould like to continue treatment as benefit determined to outweigh risk.Discussed with him that he is also on opioid therapy, he takes Ativan at night when he also takes his Norco and his Seroquel.Discussed risks with taking these three medications together at same time. He has tried taking 1/2 tablet Ativan, but this does not work well.  Last Ativan fill on PMP review 01/04/20 and last opioid fill 01/11/20 with recent UDS with pain management on 09/03/19. Saw Dr. 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  Sees neurology again March 1st.   Duration:stable Anxious mood:yes Excessive worrying:no Irritability:no Sweating:no Nausea:no Palpitations:no Hyperventilation:no Panic attacks:no Agoraphobia:no Obscessions/compulsions:no Depressed mood:no Depression screen [facility] 2/9 02/11/2020 11/10/2019 07/01/2019 04/21/2019 03/26/2019  Decreased Interest 1 1 0 0 0  Down, Depressed, Hopeless 1 1 0 0 0  PHQ - 2 Score 2 2 0 0 0  Altered sleeping 1 0 0 - 0  Tired, decreased energy 1 1 1  - 1  Change in appetite 0 1 0 - 0  Feeling bad or failure about yourself  0 1 0 - 0  Trouble concentrating 0 0 0 - 0  Moving slowly or fidgety/restless 0 1 0 - 0  Suicidal thoughts 0 0 0 - 0  PHQ-9 Score 4 6 1  - 1  Difficult doing work/chores Not difficult at all Somewhat difficult Not difficult at all - Not difficult at all  Some recent data might be hidden   GAD 7 :  Generalized Anxiety Score 02/11/2020 08/14/2018 07/02/2017  Nervous, Anxious, on Edge 0 0 1  Control/stop worrying 1 1 1   Worry too much - different things 1 2 2   Trouble relaxing 0 0 1  Restless 0 0 0  Easily annoyed or irritable 0 0 1  Afraid - awful might happen  0 1 2  Total GAD 7 Score 2 4 8   Anxiety Difficulty Not difficult at all Not difficult at all Somewhat difficult   URINARY SYMPTOMS Noticed symptoms 2 weeks ago and took Amoxicillin at home.   Dysuria: no Urinary frequency: no Urgency: no Small volume voids: no Symptom severity: yes Urinary incontinence: no Foul odor: no Hematuria: no Abdominal pain: no Back pain: yes Suprapubic pain/pressure: no Flank pain: no Fever:  no Vomiting: no Status: stable Previous urinary tract infection: yes Recurrent urinary tract infection: no Sexual activity: monogomous History of sexually transmitted disease: no Penile discharge: no Treatments attempted: pyridium and increasing fluids   Relevant past medical, surgical, family and social history reviewed and updated as indicated. Interim medical history since our last visit reviewed. Allergies and medications reviewed and updated.  Review of Systems  Constitutional: Negative for activity change, appetite change, fatigue and fever.  Respiratory: Negative for cough, chest tightness, shortness of breath and wheezing.   Cardiovascular: Negative for chest pain, palpitations and leg swelling.  Endocrine: Negative for polydipsia, polyphagia and polyuria.  Musculoskeletal: Negative.   Neurological: Negative.   Psychiatric/Behavioral: Positive for decreased concentration and sleep disturbance. Negative for self-injury and suicidal ideas. The patient is not nervous/anxious.     Per HPI unless specifically indicated above     Objective:    BP 92/65 (BP Location: Left Arm)   Pulse 92   Temp 97.6 F (36.4 C)   Wt 275 lb 12.8 oz (125.1 kg)   SpO2 98%   BMI 38.47 kg/m   Wt Readings from Last 3 Encounters:  02/11/20 275 lb 12.8 oz (125.1 kg)  11/25/19 281 lb (127.5 kg)  11/22/19 272 lb (123.4 kg)    Physical Exam Vitals and nursing note reviewed.  Constitutional:      General: He is awake. He is not in acute distress.    Appearance: He is  well-developed and well-groomed. He is obese.  HENT:     Head: Normocephalic and atraumatic.     Right Ear: Hearing normal. No drainage.     Left Ear: Hearing normal. No drainage.  Eyes:     General: Lids are normal.        Right eye: No discharge.        Left eye: No discharge.     Conjunctiva/sclera: Conjunctivae normal.     Pupils: Pupils are equal, round, and reactive to light.  Neck:     Thyroid: No thyromegaly.     Vascular: No carotid bruit.  Cardiovascular:     Rate and Rhythm: Normal rate and regular rhythm.     Heart sounds: Normal heart sounds, S1 normal and S2 normal. No murmur heard. No gallop.   Pulmonary:     Effort: Pulmonary effort is normal. No accessory muscle usage or respiratory distress.     Breath sounds: Normal breath sounds.  Abdominal:     General: Bowel sounds are normal.     Palpations: Abdomen is soft.     Tenderness: There is no abdominal tenderness. There is no right CVA tenderness or left CVA tenderness.  Musculoskeletal:        General: Normal range  of motion.     Cervical back: Normal range of motion and neck supple.     Right lower leg: No edema.     Left lower leg: No edema.  Skin:    General: Skin is warm and dry.  Neurological:     Mental Status: He is alert and oriented to person, place, and time.  Psychiatric:        Attention and Perception: Attention normal.        Mood and Affect: Mood normal.        Behavior: Behavior normal. Behavior is cooperative.        Thought Content: Thought content normal.     Results for orders placed or performed in visit on 02/11/20  Microscopic Examination   Urine  Result Value Ref Range   WBC, UA 11-30 (A) 0 - 5 /hpf   RBC None seen 0 - 2 /hpf   Epithelial Cells (non renal) 0-10 0 - 10 /hpf   Bacteria, UA Many (A) None seen/Few  Microalbumin, Urine Waived  Result Value Ref Range   Microalb, Ur Waived 30 (H) 0 - 19 mg/L   Creatinine, Urine Waived 50 10 - 300 mg/dL   Microalb/Creat Ratio  30-300 (H) <30 mg/g  Bayer DCA Hb A1c Waived  Result Value Ref Range   HB A1C (BAYER DCA - WAIVED) 5.9 <7.0 %  Urinalysis, Routine w reflex microscopic  Result Value Ref Range   Specific Gravity, UA 1.010 1.005 - 1.030   pH, UA 5.5 5.0 - 7.5   Color, UA Yellow Yellow   Appearance Ur Clear Clear   Leukocytes,UA Trace (A) Negative   Protein,UA Negative Negative/Trace   Glucose, UA Negative Negative   Ketones, UA Negative Negative   RBC, UA Negative Negative   Bilirubin, UA Negative Negative   Urobilinogen, Ur 0.2 0.2 - 1.0 mg/dL   Nitrite, UA Negative Negative   Microscopic Examination See below:       Assessment & Plan:   Problem List Items Addressed This Visit      Cardiovascular and Mediastinum   HTN (hypertension) (Chronic)    Chronic, ongoing.  Followed by cardiology.  BP below goal today in office.  With orthostatic BP presenting occasionally will continue current regimen at this time and have recommended utilizing compression hose at home, agrees to try this.  Continue collaboration with cardiology and current medication regimen.  Labs today to include BMP.      Relevant Orders   Basic metabolic panel   Aortic atherosclerosis (HCC) (Chronic)    Noted on past imaging, continue statin and BP control for prevention.      Abdominal aortic aneurysm (AAA) ([place]) (Chronic)    Noted on imaging on 11/22/19.  With recommendation to monitor annually.  Will continue statin and monitor BP control.  Repeat imaging October 2022.      Chronic orthostatic hypotension    Continue collaboration with cardiology and medication regimen as prescribed by them.  Recommend ensuring good hydration throughout daytime hours + use of compression hose at home (on during daytime and off during night), agrees to try them.      Complete heart block ([place])    Followed by cardiology.  Pacemaker checks by them.         Endocrine   IFG (impaired fasting glucose) (Chronic)    Noted on past labs -- A1c  today downward trend to 5.9% and urine ALB 30.  Concern for T2DM and discussed with wife  and patient.  Initiate medication as needed and continue to collaborate with CCM team.        Relevant Orders   Microalbumin, Urine Waived (Completed)   Bayer DCA Hb A1c Waived (Completed)     Genitourinary   Stage 3a chronic kidney disease ([place]) (Chronic)    Recent labs improved noting no CKD, recheck BMP today and adjust medications as needed.        Hematopoietic and Hemostatic   Other thrombophilia (HCC) (Chronic)    On Eliquis with A-fib.  Continue to monitor closely for bleeding or increased bruising.  CBC annually.        Other   Dyslipidemia (Chronic)    Chronic, ongoing.  Continue current medication regimen and adjust as needed. Lipid panel today.      Relevant Orders   Lipid Panel w/o Chol/HDL Ratio   Anxiety (Chronic)    Chronic, ongoing.  Discussed at length risk of benzo and opioid use in conjunction with each other.  Recommend he not take the two together at same hour during day or evening.  He tried to cut back to 1/2 tablet but was unsuccessful in past.   Continue to collaborate with CCM team on education.  Provided wife and him with copy of [facility] ben[facility] risk information patient sheet in past.  They request 90 day supply, as previous PCP supplied this.  Are aware he does have to return every 3 months for refills.  UDS up to date with pain management in August 2021.  Refills sent.      Relevant Medications   LORazepam (ATIVAN) 1 MG tablet   Chronic pain syndrome (Chronic)    Chronic, ongoing followed by pain clinic.  Discussed at length risk of benzo and opioid use in conjunction with each other.  Recommend he not take the two together at same hour during day or evening.        Morbid obesity (HCC) (Chronic)    BMI 38.47 with HTN, A-Fib, Heart Block, and CKD.  Recommended eating smaller high protein, low fat meals more frequently and exercising 30 mins a day 5 times a week with a  goal of 10-15lb weight loss in the next 3 months. Patient voiced their understanding and motivation to adhere to these recommendations.       Depression, recurrent ([place]) - Primary    Chronic, ongoing.  Denies SI/HI.  Continue current medication regimen and adjust as needed.  Would benefit from trial off Effexor and trial of SSRI, but refuses this.        Relevant Medications   LORazepam (ATIVAN) 1 MG tablet    Other Visit Diagnoses    Prostate cancer screening       Repeat PSA, on upper level normal for age last visit -- suspect some BPH present.   Relevant Orders   PSA   Dysuria       UA today trace LEUK and many bacteria, send for culture and will treat accordingly if infection present on culture.   Relevant Orders   Urinalysis, Routine w reflex microscopic (Completed)   Pyuria       UA today trace LEUK and many bacteria, send for culture and will treat accordingly if infection present on culture.   Relevant Orders   Urine Culture       Follow up plan: Return in about 3 months (around 05/11/2020).

## 2020-02-11 NOTE — Assessment & Plan Note (Signed)
Recent labs improved noting no CKD, recheck BMP today and adjust medications as needed.

## 2020-02-11 NOTE — Assessment & Plan Note (Signed)
Continue collaboration with cardiology and medication regimen as prescribed by them.  Recommend ensuring good hydration throughout daytime hours + use of compression hose at home (on during daytime and off during night), agrees to try them. 

## 2020-02-11 NOTE — Assessment & Plan Note (Signed)
Noted on imaging on 11/22/19.  With recommendation to monitor annually.  Will continue statin and monitor BP control.  Repeat imaging October 2022.

## 2020-02-12 LAB — LIPID PANEL W/O CHOL/HDL RATIO
Cholesterol, Total: 171 mg/dL (ref 100–199)
HDL: 61 mg/dL (ref >39)
LDL Chol Calc (NIH): 85 mg/dL (ref 0–99)
Triglycerides: 147 mg/dL (ref 0–149)
VLDL Cholesterol Cal: 25 mg/dL (ref 5–40)

## 2020-02-12 LAB — BASIC METABOLIC PANEL
BUN/Creatinine Ratio: 10 (ref 10–24)
BUN: 14 mg/dL (ref 8–27)
CO2: 21 mmol/L (ref 20–29)
Calcium: 10 mg/dL (ref 8.6–10.2)
Chloride: 102 mmol/L (ref 96–106)
Creatinine, Ser: 1.34 mg/dL — ABNORMAL HIGH (ref 0.76–1.27)
GFR calc Af Amer: 60 mL/min/{1.73_m2} (ref >59)
GFR calc non Af Amer: 52 mL/min/{1.73_m2} — ABNORMAL LOW (ref >59)
Glucose: 210 mg/dL — ABNORMAL HIGH (ref 65–99)
Potassium: 4.2 mmol/L (ref 3.5–5.2)
Sodium: 141 mmol/L (ref 134–144)

## 2020-02-12 LAB — PSA: Prostate Specific Ag, Serum: 4.2 ng/mL — ABNORMAL HIGH (ref 0.0–4.0)

## 2020-02-12 NOTE — Progress Notes (Signed)
Contacted via MyChart   Good afternoon Nicholas [name], your labs have returned.  Sugar, glucose, is still elevated, but your A1c was normal, so we will continue to monitor closely.  Kidney function shows a little kidney disease again, but mild with GFR 52.  Would like to see >60.  We will continue to monitor, cut back on sweets and drink more water.  Cholesterol levels are trending down to goal with change, although may benefit from increase in Rosuvastatin to 40 MG -- let me know if you would like to increase.  PSA on labs is trending down, remains in normal range for your age.  You are doing great!! Keep being awesome!!  Thank you for allowing me to participate in your care. Kindest regards, Niyanna Asch

## 2020-02-13 ENCOUNTER — Other Ambulatory Visit: Payer: Self-pay | Admitting: Nurse Practitioner

## 2020-02-13 MED ORDER — ROSUVASTATIN CALCIUM 40 MG PO TABS: 40.0000 mg | ORAL_TABLET | Freq: Every day | ORAL | 4 refills | Status: DC

## 2020-02-13 NOTE — Progress Notes (Signed)
Contacted via MyChart   Good morning Nicholas [name], your initial urine culture has returned showing >100,00 growth.  I am waiting on susceptibilities to see which antibiotic to treat with and once these return I will send an antibiotic in.  Thank you:)

## 2020-02-14 ENCOUNTER — Other Ambulatory Visit: Payer: Self-pay | Admitting: Nurse Practitioner

## 2020-02-14 LAB — URINE CULTURE

## 2020-02-14 MED ORDER — AMOXICILLIN-POT CLAVULANATE 875-125 MG PO TABS: 1.0000 | ORAL_TABLET | Freq: Two times a day (BID) | ORAL | 0 refills | Status: AC

## 2020-02-14 NOTE — Progress Notes (Signed)
Contacted via MyChart   Good evening Nicholas [name], your urine susceptibilities and final culture have returned.  You do have a urine infection that is susceptible to penicillin medications.  I just sent some Augmentin, which has amoxicillin and works well for urine infections.  If any ongoing symptoms present please let me know.  There will be no golfing today, but maybe tomorrow when this all melts:) Have a great evening!! Keep being amazing!!  Thank you for allowing me to participate in your care. Kindest regards, Guiseppe Flanagan

## 2020-02-19 ENCOUNTER — Other Ambulatory Visit: Payer: Self-pay | Admitting: Internal Medicine

## 2020-02-19 NOTE — Telephone Encounter (Signed)
This is a [place] pt 

## 2020-02-24 ENCOUNTER — Other Ambulatory Visit: Payer: Self-pay

## 2020-02-24 ENCOUNTER — Ambulatory Visit: Payer: Medicare Other | Admitting: Internal Medicine

## 2020-02-24 VITALS — BP 89/64 | HR 88 | Ht 71.0 in | Wt 276.0 lb

## 2020-02-24 DIAGNOSIS — I480 Paroxysmal atrial fibrillation: Secondary | ICD-10-CM | POA: Diagnosis not present

## 2020-02-24 DIAGNOSIS — Z950 Presence of cardiac pacemaker: Secondary | ICD-10-CM | POA: Diagnosis not present

## 2020-02-24 DIAGNOSIS — I442 Atrioventricular block, complete: Secondary | ICD-10-CM | POA: Diagnosis not present

## 2020-02-24 DIAGNOSIS — G4730 Sleep apnea, unspecified: Secondary | ICD-10-CM

## 2020-02-24 DIAGNOSIS — F32A Depression, unspecified: Secondary | ICD-10-CM | POA: Diagnosis not present

## 2020-02-24 LAB — PACEMAKER DEVICE OBSERVATION

## 2020-02-24 MED ORDER — MIDODRINE HCL 10 MG PO TABS: 10.0000 mg | ORAL_TABLET | Freq: Three times a day (TID) | ORAL | 1 refills | Status: DC

## 2020-02-24 NOTE — Patient Instructions (Addendum)
Medication Instructions:  - Your physician has recommended you make the following change in your medication:   1) INCREASE Proamitine (Midodrine) to 10 mg- take 1 tablet by mouth every 4 hours (7am, 11am, 3pm)  2) STOP Rythmol (propafenone)  *If you need a refill on your cardiac medications before your next appointment, please call your pharmacy*   Lab Work: - none ordered  If you have labs (blood work) drawn today and your tests are completely normal, you will receive your results only by: Marland Kitchen MyChart Message (if you have MyChart) OR . A paper copy in the mail If you have any lab test that is abnormal or we need to change your treatment, we will call you to review the results.   Testing/Procedures: 1) You have been referred to :  - Dr. Lars Mage- for consideration of atrial fibrillation ablation  2) You have been referred to :  - Dr. Cephus Shell[facility] [place]. [address] [phone]  - Dr. Waylan Boga office will reach out to you directly to schedule an appointment  3) Your physician has recommended that you have a home sleep study. This test records several body functions during sleep, including: brain activity, eye movement, oxygen and carbon dioxide blood levels, heart rate and rhythm, breathing rate and rhythm, the flow of air through your mouth and nose, snoring, body muscle movements, and chest and belly movement.  - staff from our [place] office will reach out to you directly to help arrange this for you    Follow-Up: At [facility], you and your health needs are our priority.  As part of our continuing mission to provide you with exceptional heart care, we have created designated Provider Care Teams.  These Care Teams include your primary Cardiologist (physician) and Advanced Practice Providers (APPs -  Physician Assistants and Nurse Practitioners) who all work together to provide you with the care you need, when you need it.  We  recommend signing up for the patient portal called "MyChart".  Sign up information is provided on this After Visit Summary.  MyChart is used to connect with patients for Virtual Visits (Telemedicine).  Patients are able to view lab/test results, encounter notes, upcoming appointments, etc.  Non-urgent messages can be sent to your provider as well.   To learn more about what you can do with MyChart, go to [email or web address].    Your next appointment:   6 month(s)  The format for your next appointment:   In Person  Provider:   Virl Axe, MD   Other Instructions n/a

## 2020-02-24 NOTE — Progress Notes (Signed)
Patient Care Team: Venita Lick, NP as PCP - General (Nurse Practitioner) [name], MD (Specialist) Hollice Espy, MD as Consulting Physician (Urology) Tanda Rockers, MD as Consulting Physician (Pulmonary Disease) [name], MD as Consulting Physician (Anesthesiology) Deboraha Sprang, MD as Consulting Physician (Cardiology) Greg Cutter, LCSW as Social Worker (Licensed Clinical Social Worker) Vladimir Faster, [facility] (Pharmacist)   HPI  Nicholas [name] is a 75 y.o. male Seen in follow-up for His bundle pacemaker implanted 1/18 for symptomatic bradycardia  and profound first degree AV block with intermittent third and second-degree heart block; also atrial fibrillation, previously on flecainide but discontinued because of coronary disease diagnosed by CTA 10/21.  Now propafenone     At last visit significantly volume overloaded.  Diuresis initiated with a 15 pound weight loss.  However, his dyspnea was not significantly less.  There is also loss of dizziness.  Orthostatic.  Perhaps aggravated by the propafenone.  Taking ProAmatine.  Per neurology the dose was increased to 3 times a day.  No appreciable benefit.   He has obstructive sleep apnea by history but has not been interested in testing or therapy; however, he recently has been willing to consider a home sleep study and is amenable consequential treatments.  His biggest complaint remains fatigue.  Cannot finish his golf.  Primary care notes were reviewed.  The depression medications were noted.  DATE TEST EF   10/15 echo     50 %   Mild RV dysfunction   11/17 echo    60 %    Nl RV function   11/17 Myoview   46 ([facility])   10/21 Cardiac-CTA  LADd- FFR 0.75; o/w >90  10/21 Echo 60-65%   10/21 CTA-PE      hxstory of pulmonary embolism with repeat venous Dopplers 1/16 demonstrated small clots Managed with chronic apixaban    DATE PR interval QRSduration Dose  3/19 228 126 (HIS pacing) 100  3/21  P-synchronous   pacing  124 ( His pacing) 100  1/   Propafenone 225          Date Cr K LDL Hgb  6/19 1.08   14.6  6/20 1.13   14.7  6/21 1.17<<1.46   15.6  1/22 1.34 4.2 85 13.6 (27/25)    Thromboembolic risk factors ( age -68 , HTN-1) for a CHADSVASc Score of 2      Past Medical History:  Diagnosis Date  . Anterior urethral stricture   . Anxiety   . Arthritis    a. knees, hips, hands;  b. 11/2013 s/p L TKA @ [place].  . Bile reflux gastritis   . Bulging lumbar disc   . BXO (balanitis xerotica obliterans)   . 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 Depression   . DVT (deep venous thrombosis) ([place])   . Erosive esophagitis   . Gross hematuria   . Hyperlipemia   . Hypertension    borderline  . Internal hemorrhoids   . Phimosis   . Pulmonary embolism [facility])     Past Surgical History:  Procedure Laterality Date  . BUNIONECTOMY Bilateral 01/06/2015   Procedure: BUNIONECTOMY;  Surgeon: [name], MD;  Location: [facility] ORS;  Service: Orthopedics;  Laterality: Bilateral;  . CARDIAC CATHETERIZATION  ~ 2005   "once"  . CATARACT EXTRACTION W/ INTRAOCULAR LENS  IMPLANT, BILATERAL Bilateral ~ 2010  .  COLONOSCOPY WITH PROPOFOL N/A 12/07/2015   Procedure: COLONOSCOPY WITH PROPOFOL;  Surgeon: Lollie Sails, MD;  Location[facility] ENDOSCOPY;  Service: Endoscopy;  Laterality: N/A;  . EP IMPLANTABLE DEVICE N/A 02/16/2016   MDT dual chamber (His Bundle) pacemaker implanted by Dr Caryl Comes for intermittent complete heart block  . ESOPHAGOGASTRODUODENOSCOPY (EGD) WITH PROPOFOL N/A 12/07/2015   Procedure: ESOPHAGOGASTRODUODENOSCOPY (EGD) WITH PROPOFOL;  Surgeon: Lollie Sails, MD;  Location[facility] END[facility]COPY;  Service: Endoscopy;  Laterality: N/A;  . ESOPHAGOGASTRODUODENOSCOPY (EGD) WITH PROPOFOL N/A 05/17/2017   Procedure: ESOPHAGOGASTRODUODENOSCOPY (EGD) WITH PROPOFOL;  Surgeon: Lollie Sails, MD;  Location[facility] ENDOSCOPY;   Service: Endoscopy;  Laterality: N/A;  . HAMMER TOE SURGERY Bilateral 01/06/2015   Procedure: HAMMER TOE CORRECTION;  Surgeon: [name], MD;  Location: [facility] ORS;  Service: Orthopedics;  Laterality: Bilateral;  . KNEE CARTILAGE SURGERY Left 1965   "football injury"  . Left Total Knee Arthroplasty     a. 11/2013 [facility].  Marland Kitchen PILONIDAL CYST EXCISION  1970's  . TOTAL HIP ARTHROPLASTY Right 2004  . TOTAL HIP ARTHROPLASTY Left 2006  . UPPER GI ENDOSCOPY      Current Outpatient Medications  Medication Sig Dispense Refill  . acetaminophen (TYLENOL) 650 MG CR tablet Take 650 mg by mouth daily.     Marland Kitchen apixaban (ELIQUIS) 5 MG TABS tablet Take 5 mg by mouth 2 (two) times daily.    Marland Kitchen ascorbic acid (VITAMIN C) 250 MG tablet Take 1,000 mg by mouth daily.     . Cranberry 500 MG CAPS Take 1 capsule by mouth daily.     . ferrous sulfate 325 (65 FE) MG tablet Take 325 mg by mouth daily with breakfast.    . gabapentin (NEURONTIN) 600 MG tablet Take 1 tablet (600 mg total) by mouth at bedtime. 90 tablet 4  . HYDROcodone-acetaminophen (NORCO/VICODIN) 5-325 MG tablet Take 1 tablet by mouth 2 (two) times daily as needed. 60 tablet 0  . [START ON 03/11/2020] HYDROcodone-acetaminophen (NORCO/VICODIN) 5-325 MG tablet Take 1 tablet by mouth 2 (two) times daily as needed for moderate pain or severe pain. 60 tablet 0  . hyoscyamine (LEVSIN, ANASPAZ) 0.125 MG tablet Take 0.125 mg by mouth every 6 (six) hours as needed for bladder spasms or cramping.     Marland Kitchen LORazepam (ATIVAN) 1 MG tablet Take 1 tablet (1 mg total) by mouth at bedtime. 90 tablet 0  . Magnesium 400 MG TABS Take 400 mg by mouth daily.     . Melatonin 10 MG TABS Take 10 mg by mouth at bedtime.     . methocarbamol (ROBAXIN) 500 MG tablet Take 1 tablet (500 mg total) by mouth 2 (two) times daily as needed for muscle spasms. 60 tablet 2  . midodrine (PROAMATINE) 10 MG tablet Take 1 tablet (10 mg total) by mouth 3 (three) times daily. 270 tablet 1  . Multiple  Vitamin (MULTI-VITAMINS) TABS Take 1 tablet by mouth daily.     . Omega-3 Fatty Acids (FISH OIL) 1200 MG CAPS Take 1,200 mg by mouth daily.    Marland Kitchen oxymetazoline (AFRIN) 0.05 % nasal spray Place 2 sprays into both nostrils at bedtime as needed for congestion.    . pantoprazole (PROTONIX) 20 MG tablet Take 1 tablet (20 mg total) by mouth daily. 90 tablet 4  . potassium chloride SA (KLOR-CON) 20 MEQ tablet Take 2 tablets (40 meq) x 1 dose as directed 10 tablet 0  . QUEtiapine Fumarate (SEROQUEL XR) 150 MG 24 hr tablet TAKE  1 TABLET (150 MG TOTAL) BY MOUTH AT BEDTIME. 90 tablet 4  . rosuvastatin (CRESTOR) 40 MG tablet Take 1 tablet (40 mg total) by mouth daily. 90 tablet 4  . solifenacin (VESICARE) 10 MG tablet Take 1 tablet (10 mg total) by mouth daily. 90 tablet 4  . torsemide (DEMADEX) 20 MG tablet Take 1 tablet (20 mg) by mouth once daily x 5 days, then take 1 tablet daily as needed for increased shortness of breath/ swelling 30 tablet 3  . torsemide (DEMADEX) 20 MG tablet Take 20 mg by mouth daily as needed.    . triamcinolone cream (KENALOG) 0.1 % Apply 1 application topically 2 (two) times daily.     Marland Kitchen venlafaxine XR (EFFEXOR XR) 75 MG 24 hr capsule Take 3 capsules (225 mg total) by mouth daily. 270 capsule 4   No current facility-administered medications for this visit.    No Known Allergies    Review of Systems negative except from HPI and PMH  Physical Exam   BP (!) 89/64 (BP Location: Left Arm, Patient Position: Sitting, Cuff Size: Large)   Pulse 88   Ht 5\' 11"  (1.803 m)   Wt 276 lb (125.2 kg)   SpO2 97%   BMI 38.49 kg/m  Well developed and well nourished in no acute distress HENT normal Neck supple with JVP-flat Clear Device pocket well healed; without hematoma or erythema.  There is no tethering  Regular rate and rhythm, no  murmur Abd-soft with active BS No Clubbing cyanosis  tr edema Skin-warm and dry A & Oriented  Grossly normal sensory and motor function  ECG P  synchronous pacing with a long isoelectric.  Between the ventricular pacing spike in the ventricular electrogram.  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  Hence we will discontinue the propafenone which may be contributing to his dizziness and will refer him to Dr. Quentin Ore to discuss catheter ablation of his atrial fibrillation.  In this regard, his sleep apnea becomes important.  Data suggest that the benefits of catheter ablation are significantly mitigated by untreated sleep apnea.  He has been made aware of this.  He is willing to consider therapies if sleep apnea is identified.  This may also have had a profound impact on his fatigue.  The fatigue may also interplay with his depression.  He is on 2 antidepressants.  His wife says that they help some.  He is interested in seeing if there is a better pharmacotherapeutic regime.  He is agreeable and I have asked him to see Dr. Nicolasa Ducking.  Without symptoms of ischemia

## 2020-02-25 LAB — CUP PACEART INCLINIC DEVICE CHECK
Battery Remaining Longevity: 39 mo
Battery Voltage: 2.98 V
Brady Statistic AP VP Percent: 29.71 %
Brady Statistic AP VS Percent: 0.07 %
Brady Statistic AS VP Percent: 69.02 %
Brady Statistic AS VS Percent: 1.21 %
Brady Statistic RA Percent Paced: 26.98 %
Brady Statistic RV Percent Paced: 98.63 %
Date Time Interrogation Session: 20220201124000
Implantable Lead Implant Date: 20180124
Implantable Lead Implant Date: 20180124
Implantable Lead Location: 753859
Implantable Lead Location: 753860
Implantable Lead Model: 3830
Implantable Lead Model: 5076
Implantable Pulse Generator Implant Date: 20180124
Lead Channel Impedance Value: 361 Ohm
Lead Channel Impedance Value: 380 Ohm
Lead Channel Impedance Value: 399 Ohm
Lead Channel Impedance Value: 475 Ohm
Lead Channel Pacing Threshold Amplitude: 0.5 V
Lead Channel Pacing Threshold Amplitude: 0.75 V
Lead Channel Pacing Threshold Pulse Width: 0.4 ms
Lead Channel Pacing Threshold Pulse Width: 0.4 ms
Lead Channel Sensing Intrinsic Amplitude: 0.625 mV
Lead Channel Sensing Intrinsic Amplitude: 1.25 mV
Lead Channel Sensing Intrinsic Amplitude: 3 mV
Lead Channel Sensing Intrinsic Amplitude: 4.75 mV
Lead Channel Setting Pacing Amplitude: 2 V
Lead Channel Setting Pacing Amplitude: 2.25 V
Lead Channel Setting Pacing Pulse Width: 1 ms
Lead Channel Setting Sensing Sensitivity: 0.6 mV

## 2020-02-27 ENCOUNTER — Encounter: Payer: Self-pay | Admitting: Cardiology

## 2020-02-27 ENCOUNTER — Other Ambulatory Visit: Payer: Self-pay

## 2020-02-27 ENCOUNTER — Ambulatory Visit: Payer: Medicare Other | Admitting: Cardiology

## 2020-02-27 VITALS — BP 108/72 | HR 89 | Ht 71.0 in | Wt 274.0 lb

## 2020-02-27 DIAGNOSIS — Z950 Presence of cardiac pacemaker: Secondary | ICD-10-CM

## 2020-02-27 DIAGNOSIS — I442 Atrioventricular block, complete: Secondary | ICD-10-CM

## 2020-02-27 DIAGNOSIS — I4819 Other persistent atrial fibrillation: Secondary | ICD-10-CM

## 2020-02-27 NOTE — Progress Notes (Signed)
Electrophysiology Office Note:    Date:  02/27/2020   ID:  Nicholas [name], DOB 1945/04/30, MRN [id number]  PCP:  Nicholas Lick, NP  [facility] Cardiologist:  No primary care provider on file.  [facility] Electrophysiologist: Jolyn Nap, MD  Referring MD: Nicholas Lick, NP   Chief Complaint: Atrial fibrillation  History of Present Illness:    Nicholas [name] is a 75 y.o. male who presents for an evaluation of atrial fibrillation at the request of Dr. Jolyn Nap. Their medical history includes anxiety, complete heart block post Medtronic dual-chamber permanent pacemaker, DVT, hypertension, hyperlipidemia.  He last saw Dr. Caryl [name] February 24, 2020 for his atrial fibrillation.  He has previously been managed with flecainide but given coronary artery disease noted on CT scan and this was discontinued and he was trialed on propafenone.  This was not very effective and was recently discontinued.  He is also having orthostatic intolerance that has been related to his medication use.  There is also concern that his atrial fibrillation is contributing to his generalized malaise and exercise intolerance.  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  He tells me that other than the fatigue he cannot really tell when he is in or out of rhythm.  He is currently awaiting a sleep study.  He is working on weight reduction.  Past Medical History:  Diagnosis Date  . Anterior urethral stricture   . Anxiety   . Arthritis    a. knees, hips, hands;  b. 11/2013 s/p L TKA @ [place].  . Bile reflux gastritis   . Bulging lumbar disc   . BXO (balanitis xerotica obliterans)   . Complete heart block (HCC)    a. s/p MDT dual chamber (His bundle) pacemaker 01/2016 Dr Nicholas [name]  . Depression   . DVT (deep venous thrombosis) ([place])   . Erosive esophagitis   . Gross hematuria   . Hyperlipemia   . Hypertension    borderline  . Internal hemorrhoids   . Phimosis   . Pulmonary embolism [facility])      Past Surgical History:  Procedure Laterality Date  . BUNIONECTOMY Bilateral 01/06/2015   Procedure: BUNIONECTOMY;  Surgeon: [name], MD;  Location: [facility] ORS;  Service: Orthopedics;  Laterality: Bilateral;  . CARDIAC CATHETERIZATION  ~ 2005   "once"  . CATARACT EXTRACTION W/ INTRAOCULAR LENS  IMPLANT, BILATERAL Bilateral ~ 2010  . COLONOSCOPY WITH PROPOFOL N/A 12/07/2015   Procedure: COLONOSCOPY WITH PROPOFOL;  Surgeon: Lollie Sails, MD;  Location: [facility] ENDOSCOPY;  Service: Endoscopy;  Laterality: N/A;  . EP IMPLANTABLE DEVICE N/A 02/16/2016   MDT dual chamber (His Bundle) pacemaker implanted by Dr Nicholas [name] for intermittent complete heart block  . ESOPHAGOGASTRODUODENOSCOPY (EGD) WITH PROPOFOL N/A 12/07/2015   Procedure: ESOPHAGOGASTRODUODENOSCOPY (EGD) WITH PROPOFOL;  Surgeon: Lollie Sails, MD;  Location[facility] ENDOSCOPY;  Service: Endoscopy;  Laterality: N/A;  . ESOPHAGOGASTRODUODENOSCOPY (EGD) WITH PROPOFOL N/A 05/17/2017   Procedure: ESOPHAGOGASTRODUODENOSCOPY (EGD) WITH PROPOFOL;  Surgeon: Lollie Sails, MD;  Location: [facility] ENDOSCOPY;  Service: Endoscopy;  Laterality: N/A;  . HAMMER TOE SURGERY Bilateral 01/06/2015   Procedure: HAMMER TOE CORRECTION;  Surgeon: [name], MD;  Location: [facility] ORS;  Service: Orthopedics;  Laterality: Bilateral;  . KNEE CARTILAGE SURGERY Left 1965   "football injury"  . Left Total Knee Arthroplasty     a. 11/2013 [facility].  Nicholas [name] PILONIDAL CYST EXCISION  1970's  . TOTAL HIP ARTHROPLASTY Right 2004  .  TOTAL HIP ARTHROPLASTY Left 2006  . UPPER GI ENDOSCOPY      Current Medications: Current Meds  Medication Sig  . acetaminophen (TYLENOL) 650 MG CR tablet Take 650 mg by mouth daily.   Nicholas [name] apixaban (ELIQUIS) 5 MG TABS tablet Take 5 mg by mouth 2 (two) times daily.  Nicholas [name] ascorbic acid (VITAMIN C) 250 MG tablet Take 1,000 mg by mouth daily.   . Cranberry 500 MG CAPS Take 1 capsule by mouth daily.   . ferrous sulfate 325 (65 FE) MG tablet Take  325 mg by mouth daily with breakfast.  . gabapentin (NEURONTIN) 600 MG tablet Take 1 tablet (600 mg total) by mouth at bedtime.  Nicholas [name] HYDROcodone-acetaminophen (NORCO/VICODIN) 5-325 MG tablet Take 1 tablet by mouth 2 (two) times daily as needed. (Patient taking differently: Take 1 tablet by mouth 2 (two) times daily.)  . hyoscyamine (LEVSIN, ANASPAZ) 0.125 MG tablet Take 0.125 mg by mouth every 6 (six) hours as needed for bladder spasms or cramping.   Nicholas [name] LORazepam (ATIVAN) 1 MG tablet Take 1 tablet (1 mg total) by mouth at bedtime.  . Magnesium 400 MG TABS Take 400 mg by mouth daily.   . Melatonin 10 MG TABS Take 10 mg by mouth at bedtime.   . methocarbamol (ROBAXIN) 500 MG tablet Take 1 tablet (500 mg total) by mouth 2 (two) times daily as needed for muscle spasms.  . midodrine (PROAMATINE) 10 MG tablet Take 1 tablet (10 mg total) by mouth 3 (three) times daily.  . Multiple Vitamin (MULTI-VITAMINS) TABS Take 1 tablet by mouth daily.   . Omega-3 Fatty Acids (FISH OIL) 1200 MG CAPS Take 1,200 mg by mouth daily.  Nicholas [name] oxymetazoline (AFRIN) 0.05 % nasal spray Place 2 sprays into both nostrils at bedtime as needed for congestion.  . pantoprazole (PROTONIX) 20 MG tablet Take 1 tablet (20 mg total) by mouth daily.  . potassium chloride SA (KLOR-CON) 20 MEQ tablet Take 2 tablets (40 meq) x 1 dose as directed  . QUEtiapine Fumarate (SEROQUEL XR) 150 MG 24 hr tablet TAKE 1 TABLET (150 MG TOTAL) BY MOUTH AT BEDTIME.  . rosuvastatin (CRESTOR) 40 MG tablet Take 1 tablet (40 mg total) by mouth daily.  . solifenacin (VESICARE) 10 MG tablet Take 1 tablet (10 mg total) by mouth daily.  Nicholas [name] torsemide (DEMADEX) 20 MG tablet Take 20 mg by mouth daily as needed.  . triamcinolone cream (KENALOG) 0.1 % Apply 1 application topically 2 (two) times daily.   Nicholas [name] venlafaxine XR (EFFEXOR XR) 75 MG 24 hr capsule Take 3 capsules (225 mg total) by mouth daily.     Allergies:   Patient has no known allergies.   Social History    Socioeconomic History  . Marital status: Married    Spouse name: Not on file  . Number of children: Not on file  . Years of education: Not on file  . Highest education level: High school graduate  Occupational History  . Occupation: retired   Tobacco Use  . Smoking status: Never Smoker  . Smokeless tobacco: Never Used  Vaping Use  . Vaping Use: Never used  Substance and Sexual Activity  . Alcohol use: Yes    Alcohol/week: 0.0 standard drinks    Comment: beers seldom   . Drug use: No  . Sexual activity: Not Currently  Other Topics Concern  . Not on file  Social History Narrative   Lives in [place] with wife.  Does not routinely exercise.  Activity  severely limited by bilateral knee pain.   Social Determinants of Health   Financial Resource Strain: Medium Risk  . Difficulty of Paying Living Expenses: Somewhat hard  Food Insecurity: No Food Insecurity  . Worried About Charity fundraiser in the Last Year: Never true  . Ran Out of Food in the Last Year: Never true  Transportation Needs: No Transportation Needs  . Lack of Transportation (Medical): No  . Lack of Transportation (Non-Medical): No  Physical Activity: Inactive  . Days of Exercise per Week: 0 days  . Minutes of Exercise per Session: 0 min  Stress: Not on file  Social Connections: Moderately Integrated  . Frequency of Communication with Friends and Family: More than three times a week  . Frequency of Social Gatherings with Friends and Family: More than three times a week  . Attends Religious Services: More than 4 times per year  . Active Member of Clubs or Organizations: No  . Attends Archivist Meetings: Never  . Marital Status: Married     Family History: The patient's family history includes Alcoholism in his mother; Breast cancer in his sister; Cancer in his mother; Diabetes in his father; Glaucoma in his sister; Hypertension in his father; Prostate cancer in his paternal grandfather; Stroke in  his father.  ROS:   Please see the history of present illness.    All other systems reviewed and are negative.  EKGs/Labs/Other Studies Reviewed:    The following studies were reviewed today:  February 24, 2020 device check Pacemaker check in clinic. Normal device function. HiS bundle pacing. Thresholds, sensing, impedances consistent with previous measurements. Device programmed to maximize longevity. AT/AF Burden 26.9%, +OAC. No high ventricular rates noted. Device  programmed at appropriate safety margins. Histogram distribution appropriate for patient activity level. Device programmed to optimize intrinsic conduction. Estimated longevity 3 years. Patient enrolled in remote follow-up, next scheduled check 03/15/20.   November 12, 2019 echo personally reviewed Left ventricular function normal, 60% Right ventricular function normal Mild left atrial dilation No significant valvular abnormalities   EKG:  The ekg ordered today demonstrates His bundle pacing, sinus rhythm  Recent Labs: 11/10/2019: Magnesium 2.3; TSH 4.140 11/22/2019: ALT 19; Hemoglobin 13.6; Platelets 160 02/11/2020: BUN 14; Creatinine, Ser 1.34; Potassium 4.2; Sodium 141  Recent Lipid Panel    Component Value Date/Time   CHOL 171 02/11/2020 0942   CHOL 198 01/03/2018 1610   TRIG 147 02/11/2020 0942   TRIG 264 (H) 01/03/2018 1610   HDL 61 02/11/2020 0942   CHOLHDL 3.6 07/16/2018 0813   VLDL 53 (H) 01/03/2018 1610   LDLCALC 85 02/11/2020 0942    Physical Exam:    VS:  BP 108/72   Pulse 89   Ht 5\' 11"  (1.803 m)   Wt 274 lb (124.3 kg)   BMI 38.22 kg/m     Wt Readings from Last 3 Encounters:  02/27/20 274 lb (124.3 kg)  02/24/20 276 lb (125.2 kg)  02/11/20 275 lb 12.8 oz (125.1 kg)     GEN: Well nourished, well developed in no acute distress.  Obese HEENT: Normal NECK: No JVD; No carotid bruits LYMPHATICS: No lymphadenopathy CARDIAC: RRR, no murmurs, rubs, gallops RESPIRATORY:  Clear to auscultation  without rales, wheezing or rhonchi  ABDOMEN: Soft, non-tender, non-distended MUSCULOSKELETAL:  No edema; No deformity  SKIN: Warm and dry NEUROLOGIC:  Alert and oriented x 3 PSYCHIATRIC:  Normal affect   ASSESSMENT:    1. Persistent atrial fibrillation ([place])   2.  Cardiac pacemaker in situ   3. Complete heart block (HCC)    PLAN:    In order of problems listed above:  1. Symptomatic persistent atrial fibrillation Previously managed with flecainide but this was stopped due to coronary artery disease.  Propafenone was ineffective and was thought to be contributing to his orthostatic intolerance.  He is symptomatic with fatigue and exercise intolerance. He takes Eliquis for stroke prophylaxis without bleeding issues. Discussed management options for his atrial fibrillation including alternative antiarrhythmic therapy versus ablation.  I discussed the risks, expected recovery, expected efficacy of both options and he would like to proceed with scheduling an ablation. We spent a great deal of time during today's appointment discussing the possibility that restoring normal rhythm may not improve his symptoms but given how severe his exercise intolerance is, I do think is very reasonable to pursue a rhythm control strategy. Risk, benefits, and alternatives to EP study and radiofrequency ablation for afib were also discussed in detail today. These risks include but are not limited to stroke, bleeding, vascular damage, tamponade, perforation, damage to the esophagus, lungs, and other structures, pulmonary vein stenosis, worsening renal function, and death. The patient understands these risk and wishes to proceed.  We will therefore proceed with catheter ablation at the next available time.  Carto, ICE, anesthesia are requested for the procedure.    We do not need a repeat CT scan given the PE protocol CT of the chest recently shows the pulmonary vein anatomy.  2.  Pacemaker His bundle dual-chamber  permanent pacemaker in situ.  Well-functioning.  3.  Complete heart block See #2 above.   Medication Adjustments/Labs and Tests Ordered: Current medicines are reviewed at length with the patient today.  Concerns regarding medicines are outlined above.  No orders of the defined types were placed in this encounter.  No orders of the defined types were placed in this encounter.    Signed, Lars Mage, MD, [facility]  02/27/2020 11:35 AM    Electrophysiology [facility]

## 2020-02-27 NOTE — Patient Instructions (Signed)
Medication Instructions:  - Your physician recommends that you continue on your current medications as directed. Please refer to the Current Medication list given to you today.  *If you need a refill on your cardiac medications before your next appointment, please call your pharmacy*   Lab Work: 1) Pre procedure lab work: Thursday 04/08/20 (7:30 am- 12:30 pm) -Medical Mall Entrance at [facility] 1st desk on the right to check in, past the screening table Lab hours: Monday- Friday (7:30 am- 5:30 pm)  2) Pre procedure COVID swab: Thursday 04/08/20 (8:00 am- 1:00 pm) [place] up test only- staff will come out to the car to swab you   If you have labs (blood work) drawn today and your tests are completely normal, you will receive your results only by: Marland Kitchen MyChart Message (if you have MyChart) OR . A paper copy in the mail If you have any lab test that is abnormal or we need to change your treatment, we will call you to review the results.   Testing/Procedures: - Your physician has recommended that you have an Atrial Fibrillation Ablation. Catheter ablation is a medical procedure used to treat some cardiac arrhythmias (irregular heartbeats). During catheter ablation, a long, thin, flexible tube is put into a blood vessel in your groin (upper thigh), or neck. This tube is called an ablation catheter. It is then guided to your heart through the blood vessel. Radio frequency waves destroy small areas of heart tissue where abnormal heartbeats may cause an arrhythmia to start.  - Monday 04/12/20 (arrive at 8:30 am) [place] detailed letter of instructions will be sent to your MyChart   Follow-Up: At [facility], you and your health needs are our priority.  As part of our continuing mission to provide you with exceptional heart care, we have created designated Provider Care Teams.  These Care Teams include your primary Cardiologist (physician) and Advanced Practice  Providers (APPs -  Physician Assistants and Nurse Practitioners) who all work together to provide you with the care you need, when you need it.  We recommend signing up for the patient portal called "MyChart".  Sign up information is provided on this After Visit Summary.  MyChart is used to connect with patients for Virtual Visits (Telemedicine).  Patients are able to view lab/test results, encounter notes, upcoming appointments, etc.  Non-urgent messages can be sent to your provider as well.   To learn more about what you can do with MyChart, go to [email or web address].    Your next appointment:   1) 4 weeks (from 04/12/20) with the [facility] in [place]   2) 3 months (from 04/12/20) with Dr. Quentin Ore in the [facility] office  The format for your next appointment:   In Person  Provider:   As above    Other Instructions    Cardiac Ablation Cardiac ablation is a procedure to destroy, or ablate, a small amount of heart tissue in very specific places. The heart has many electrical connections. Sometimes these connections are abnormal and can cause the heart to beat very fast or irregularly. Ablating some of the areas that cause problems can improve the heart's rhythm or return it to normal. Ablation may be done for people who:  Have Wolff-Parkinson-White syndrome.  Have fast heart rhythms (tachycardia).  Have taken medicines for an abnormal heart rhythm (arrhythmia) that were not effective or caused side effects.  Have a high-risk heartbeat that may be life-threatening. During the procedure,  a small incision is made in the neck or the groin, and a long, thin tube (catheter) is inserted into the incision and moved to the heart. Small devices (electrodes) on the tip of the catheter will send out electrical currents. A type of X-ray (fluoroscopy) will be used to help guide the catheter and to provide images of the heart. Tell a health care provider about:  Any allergies  you have.  All medicines you are taking, including vitamins, herbs, eye drops, creams, and over-the-counter medicines.  Any problems you or family members have had with anesthetic medicines.  Any blood disorders you have.  Any surgeries you have had.  Any medical conditions you have, such as kidney failure.  Whether you are pregnant or may be pregnant. What are the risks? Generally, this is a safe procedure. However, problems may occur, including:  Infection.  Bruising and bleeding at the catheter insertion site.  Bleeding into the chest, especially into the sac that surrounds the heart. This is a serious complication.  Stroke or blood clots.  Damage to nearby structures or organs.  Allergic reaction to medicines or dyes.  Need for a permanent pacemaker if the normal electrical system is damaged. A pacemaker is a small computer that sends electrical signals to the heart and helps your heart beat normally.  The procedure not being fully effective. This may not be recognized until months later. Repeat ablation procedures are sometimes done. What happens before the procedure? Medicines Ask your health care provider about:  Changing or stopping your regular medicines. This is especially important if you are taking diabetes medicines or blood thinners.  Taking medicines such as aspirin and ibuprofen. These medicines can thin your blood. Do not take these medicines unless your health care provider tells you to take them.  Taking over-the-counter medicines, vitamins, herbs, and supplements. General instructions  Follow instructions from your health care provider about eating or drinking restrictions.  Plan to have someone take you home from the hospital or clinic.  If you will be going home right after the procedure, plan to have someone with you for 24 hours.  Ask your health care provider what steps will be taken to prevent infection. What happens during the  procedure?  An IV will be inserted into one of your veins.  You will be given a medicine to help you relax (sedative).  The skin on your neck or groin will be numbed.  An incision will be made in your neck or your groin.  A needle will be inserted through the incision and into a large vein in your neck or groin.  A catheter will be inserted into the needle and moved to your heart.  Dye may be injected through the catheter to help your surgeon see the area of the heart that needs treatment.  Electrical currents will be sent from the catheter to ablate heart tissue in desired areas. There are three types of energy that may be used to do this: ? Heat (radiofrequency energy). ? Laser energy. ? Extreme cold (cryoablation).  When the tissue has been ablated, the catheter will be removed.  Pressure will be held on the insertion area to prevent a lot of bleeding.  A bandage (dressing) will be placed over the insertion area. The exact procedure may vary among health care providers and hospitals.   What happens after the procedure?  Your blood pressure, heart rate, breathing rate, and blood oxygen level will be monitored until you leave the hospital  or clinic.  Your insertion area will be monitored for bleeding. You will need to lie still for a few hours to ensure that you do not bleed from the insertion area.  Do not drive for 24 hours or as long as told by your health care provider. Summary  Cardiac ablation is a procedure to destroy, or ablate, a small amount of heart tissue using an electrical current. This procedure can improve the heart rhythm or return it to normal.  Tell your health care provider about any medical conditions you may have and all medicines you are taking to treat them.  This is a safe procedure, but problems may occur. Problems may include infection, bruising, damage to nearby organs or structures, or allergic reactions to medicines.  Follow your health care  provider's instructions about eating and drinking before the procedure. You may also be told to change or stop some of your medicines.  After the procedure, do not drive for 24 hours or as long as told by your health care provider. This information is not intended to replace advice given to you by your health care provider. Make sure you discuss any questions you have with your health care provider. Document Revised: 11/18/2018 Document Reviewed: 11/18/2018 Elsevier Patient Education  [employer].

## 2020-02-27 NOTE — Addendum Note (Signed)
Addended by: Alvis Lemmings C on: 02/27/2020 05:23 PM   Modules accepted: Orders

## 2020-03-01 ENCOUNTER — Telehealth: Payer: Self-pay | Admitting: Internal Medicine

## 2020-03-01 NOTE — Telephone Encounter (Signed)
He has been managed here by Dr. Jeananne Rama and then I took over regimen when provider retired.  Did he request psychiatry referral today?  I would recommend reaching out to Andria Frames with referral team to see if she can assist if so, she often can find providers in the insurance network.

## 2020-03-01 NOTE — Telephone Encounter (Signed)
Late entry:  1) Fax referral and paperwork sent to Dr. Nicolasa Ducking- Adult Psychiatry on 02/27/20. Faxed to [phone]. Fax confirmation not received Friday prior to my leaving the office. Fax confirmation was available this morning on my return  2) Order placed for home sleep study and staff message sent to Gershon Cull, Sleep CMA in [place] on Friday 02/27/20.

## 2020-03-01 NOTE — Telephone Encounter (Signed)
I called and spoke with the patient's wife today (ok per [facility]) regarding psychiatry referral to Dr. Nicolasa Ducking. I advised that Dr. Waylan Boga office reached out and advised they do not accept the patient's insurance.  I also advised I had reached out to the patient's PCP, Orion Crook, NP to see if she could advise on an alternative psychiatry referral & that she responded back and mentioned reaching out to someone on their referral team.   Per Mrs. Lonia Skinner, the patient is still wanting to proceed with this consultation. Per Dr. Olin Pia note from 02/24/20: - The fatigue may also interplay with his depression.  He is on 2 antidepressants.  His wife says that they help some.  He is interested in seeing if there is a better pharmacotherapeutic regime.  He is agreeable and I have asked him to see Dr. Nicolasa Ducking.   I advised Mrs. Lonia Skinner, that I will message the name given to me on the referral team with Henrine Screws, NP to see if they can advise on whom the patient may be set up as we don't typically refer to psychiatry and I am unfamiliar with any other providers in the area of that nature.  Mrs. Rake voices understanding and was appreciative for the call back.

## 2020-03-01 NOTE — Telephone Encounter (Signed)
If need be Janett Billow, let me know and I can place new referral from [place] for this.  Not sure who local could see patient -- [facility]? May need to go to [facility] or [employer].  Let me know what you need me to do.  I think it is a great idea, I have encouraged this for a little bit.

## 2020-03-01 NOTE — Telephone Encounter (Signed)
Referral message received from scheduling: [place], Starr Lake, RN Dr. Varney Biles office called in to say they do not accept patients insurance and will not be called to schedule   Will reach out to the patient's PCP to see if they are aware of another physchiatrist the patient may be referred to or if they can assist with this.  Referral being made for depression.

## 2020-03-02 ENCOUNTER — Other Ambulatory Visit: Payer: Self-pay | Admitting: Nurse Practitioner

## 2020-03-02 DIAGNOSIS — F339 Major depressive disorder, recurrent, unspecified: Secondary | ICD-10-CM

## 2020-03-02 DIAGNOSIS — Z79899 Other long term (current) drug therapy: Secondary | ICD-10-CM

## 2020-03-02 DIAGNOSIS — F419 Anxiety disorder, unspecified: Secondary | ICD-10-CM

## 2020-03-02 NOTE — Telephone Encounter (Signed)
H right now Nicholas [name] does not accept [facility],  indeed the [place] [employer] sued [facility], she tole me, because their mental health benefits and payments were poor   apparently [employer] lost in court

## 2020-03-02 NOTE — Telephone Encounter (Signed)
New referral in place.  

## 2020-03-02 NOTE — Telephone Encounter (Signed)
Yes please put in the new referral and I will take care of this. I would suggest [facility]. I know they accept the patients insurance.

## 2020-03-03 ENCOUNTER — Telehealth: Payer: Self-pay | Admitting: *Deleted

## 2020-03-03 NOTE — Telephone Encounter (Signed)
Staff message sent to Gae Bon ok to order HST. No PA is required. Patient has [employer].

## 2020-03-03 NOTE — Telephone Encounter (Signed)
-----   Message from Emily Filbert, RN sent at 02/27/2020  5:23 PM EST ----- Order placed for a home sleep study per Dr. Caryl Comes [facility]) Dx- sleep disordered breathing

## 2020-03-04 ENCOUNTER — Encounter: Payer: Self-pay | Admitting: *Deleted

## 2020-03-05 ENCOUNTER — Other Ambulatory Visit: Payer: Self-pay | Admitting: Nurse Practitioner

## 2020-03-05 DIAGNOSIS — F339 Major depressive disorder, recurrent, unspecified: Secondary | ICD-10-CM

## 2020-03-05 DIAGNOSIS — F419 Anxiety disorder, unspecified: Secondary | ICD-10-CM

## 2020-03-05 NOTE — Telephone Encounter (Signed)
Requested medication (s) are due for refill today: yes  Requested medication (s) are on the active medication list:yes  Last refill: 11/28/2019  Future visit scheduled:yes  Notes to clinic: this refill cannot be delegated   Requested Prescriptions  Pending Prescriptions Disp Refills   QUEtiapine Fumarate (SEROQUEL XR) 150 MG 24 hr tablet [Pharmacy Med Name: QUETIAPINE ER 150 MG TABLET] 90 tablet 4    Sig: TAKE 1 TABLET (150 MG TOTAL) BY MOUTH AT BEDTIME.      Not Delegated - Psychiatry:  Antipsychotics - Second Generation (Atypical) - quetiapine Failed - 03/05/2020 11:09 AM      Failed - This refill cannot be delegated      Passed - ALT in normal range and within 180 days    ALT  Date Value Ref Range Status  11/22/2019 19 0 - 44 U/L Final   SGPT (ALT)  Date Value Ref Range Status  04/07/2013 22 12 - 78 U/L Final   ALT (SGPT) Piccolo, Waived  Date Value Ref Range Status  01/03/2018 24 10 - 47 U/L Final          Passed - AST in normal range and within 180 days    AST  Date Value Ref Range Status  11/22/2019 25 15 - 41 U/L Final   SGOT(AST)  Date Value Ref Range Status  04/07/2013 28 15 - 37 Unit/L Final   AST (SGOT) Piccolo, Waived  Date Value Ref Range Status  01/03/2018 34 11 - 38 U/L Final          Passed - Completed PHQ-2 or PHQ-9 in the last 360 days      Passed - Last BP in normal range    BP Readings from Last 1 Encounters:  02/27/20 108/72          Passed - Valid encounter within last 6 months    Recent Outpatient Visits           3 weeks ago Depression, recurrent ([place])   Iron, Slayton T, NP   1 month ago Pneumonia due to infectious organism, unspecified laterality, unspecified part of lung   [facility] Kathrine Haddock, NP   2 months ago Cough   [place], Argyle T, NP   3 months ago Abdominal aortic atherosclerosis ([place])   Sims, Jolene T, NP   8 months  ago Depression, recurrent ([place])   [place], Barbaraann Faster, NP       Future Appointments             In 1 month  [place], [place]   In 2 months [place], Barbaraann Faster, NP [employer], [place]   In 4 months Quentin Ore, Hilton Cork, MD [facility], [place]

## 2020-03-08 ENCOUNTER — Telehealth: Payer: Self-pay | Admitting: Internal Medicine

## 2020-03-08 NOTE — Telephone Encounter (Signed)
Patient spouse calling  Patient has an ablation scheduled for 3/21 and then a dentist appointment the day or so after  Would like to know if they should change dental appt or would it be ok to keep Please call to discuss

## 2020-03-08 NOTE — Telephone Encounter (Signed)
Message received from Dr. Quentin Ore.  I would wait 5 days or so before having a dental cleaning. May still be sore, etc from ablation.  I have called the patient and notified him of MD response.   The patient voices understanding and is agreeable.

## 2020-03-08 NOTE — Telephone Encounter (Signed)
To Dr. Quentin Ore to advise.

## 2020-03-08 NOTE — Telephone Encounter (Signed)
To Dr. Klein to review. 

## 2020-03-08 NOTE — Telephone Encounter (Signed)
Patient spouse calling  States that Dr Caryl Comes upped the dosage of midodrine 5MG  to 10 MG at last appointment Patient still suffers from dizzy spells and would like to know if it can be increased again  Please call to discuss

## 2020-03-11 NOTE — Telephone Encounter (Signed)
Patient is aware and agreeable to Home Sleep Study through [facility]. Patient is scheduled for 04/02/20 at 11:30 to pick up home sleep kit and meet with Respiratory therapist at [facility]. Patient is aware that if this appointment date and time does not work for them they should contact Artis Delay directly at [phone]. Patient is aware that a sleep packet will be sent from [facility] in week. Patient is agreeable to treatment and thankful for call.

## 2020-03-11 NOTE — Telephone Encounter (Signed)
  Lauralee Evener, CMA  Freada Bergeron, CMA No PA is required. Patient has [place] [employer].     From: Freada Bergeron, CMA  Sent: 03/03/2020 12:24 PM EST  To: Cv Div Sleep Studies  Subject: precert                      HST

## 2020-03-12 NOTE — Telephone Encounter (Signed)
Nicholas [name] have him measure BP sititing and standing,  I wonder to what degree is AF may be contributing to his dizziness--he is scheduled for ablation 04/12/20 Thanks SK

## 2020-03-15 ENCOUNTER — Ambulatory Visit (INDEPENDENT_AMBULATORY_CARE_PROVIDER_SITE_OTHER): Payer: Medicare Other

## 2020-03-15 DIAGNOSIS — I442 Atrioventricular block, complete: Secondary | ICD-10-CM | POA: Diagnosis not present

## 2020-03-15 LAB — CUP PACEART REMOTE DEVICE CHECK
Battery Remaining Longevity: 39 mo
Battery Voltage: 2.97 V
Brady Statistic AP VP Percent: 37.99 %
Brady Statistic AP VS Percent: 0.09 %
Brady Statistic AS VP Percent: 60.6 %
Brady Statistic AS VS Percent: 1.32 %
Brady Statistic RA Percent Paced: 34.68 %
Brady Statistic RV Percent Paced: 98.48 %
Date Time Interrogation Session: 20220221101127
Implantable Lead Implant Date: 20180124
Implantable Lead Implant Date: 20180124
Implantable Lead Location: 753859
Implantable Lead Location: 753860
Implantable Lead Model: 3830
Implantable Lead Model: 5076
Implantable Pulse Generator Implant Date: 20180124
Lead Channel Impedance Value: 342 Ohm
Lead Channel Impedance Value: 342 Ohm
Lead Channel Impedance Value: 361 Ohm
Lead Channel Impedance Value: 456 Ohm
Lead Channel Pacing Threshold Amplitude: 0.5 V
Lead Channel Pacing Threshold Amplitude: 0.75 V
Lead Channel Pacing Threshold Pulse Width: 0.4 ms
Lead Channel Pacing Threshold Pulse Width: 0.4 ms
Lead Channel Sensing Intrinsic Amplitude: 1.375 mV
Lead Channel Sensing Intrinsic Amplitude: 1.375 mV
Lead Channel Sensing Intrinsic Amplitude: 1.5 mV
Lead Channel Sensing Intrinsic Amplitude: 1.5 mV
Lead Channel Setting Pacing Amplitude: 2 V
Lead Channel Setting Pacing Amplitude: 2.25 V
Lead Channel Setting Pacing Pulse Width: 1 ms
Lead Channel Setting Sensing Sensitivity: 0.6 mV

## 2020-03-16 NOTE — Telephone Encounter (Signed)
Spoke to [place], ok per DPR. She verbalized understanding to have the patient sit for 10 minutes then take BP sitting and then standing. She will do this a few times and let us know what the values are.

## 2020-03-17 ENCOUNTER — Telehealth: Payer: Self-pay | Admitting: Physician Assistant

## 2020-03-17 NOTE — Telephone Encounter (Signed)
Scheduled remote received   Normal device function.   AF burden 15.8%; ongoing at time of transmission; available EGMs suggests A. Fib; V rates controlled; history of PAF and on apixaban.  Was routed to triage for ongoing AF.   I reviewed remote Rate controlled AFib started 03/14/20 burden similar to recent called patient, not particularly aware of the AFib feels weak all of the time.  Wife ays for the last several months. He went golfing 03/14/20, was very fatiguing and felt weak all day, though not certain any different then he has felt of late. He confirms complaince with his Eliquis I offered to try and get an appt with the AFib clinic to get seen and perhaps plan for a DCCV though the patient/wife were not completely on board, mentioned plans for ablation in March.  Discussed he could still have DCCV to try an improve his symptoms until then. They would both like me to reach out to both Drs Caryl Comes and Quentin Ore for thier recommendations and advice. I will send the note to them both and their RN's.  Tommye Standard, PA-C

## 2020-03-22 ENCOUNTER — Telehealth: Payer: Self-pay

## 2020-03-22 NOTE — Telephone Encounter (Signed)
Transmission received. Patient is not back in rhythm. Patient does have events logged early am hours today where he was out of rhythm. Attempted to call patient, no answer. LMOVM.

## 2020-03-22 NOTE — Telephone Encounter (Signed)
-----   Message from [facility] [place] sent at 03/21/2020 10:39 AM EST ----- Please follow up with this patient, see if he is still in AFib.  Let him know I sent  message to Drs. Caryl Comes and [place] last week when we spoke (he wanted their input in management), Dr. Quentin Ore was out the week, had not heard back from Dr. Caryl Comes yet.  Please f/u, if still in Afib, remains rate controlled etc... if he would like to get an Afib clinic visit perhaps.  Thanks renee

## 2020-03-22 NOTE — Telephone Encounter (Signed)
Called patient to send manual transmission. States his wife sends his transmission and he would like to wait until she can send it. States she will be there this afternoon and will send. Advised we will check later to see if it came through. Advised to call if further assistance if needed. Agreeable to plan.

## 2020-03-23 NOTE — Telephone Encounter (Signed)
Spoke with pt spouse, advised he is still out oh rhythm as of last transmission.  They are agreeable to AF clinic consult.

## 2020-03-23 NOTE — Telephone Encounter (Signed)
Patient spouse returning call Has been trying to get back in touch with the [place] office all day without any luck  Please call to discuss

## 2020-03-23 NOTE — Progress Notes (Signed)
Remote pacemaker transmission.   

## 2020-03-23 NOTE — Telephone Encounter (Signed)
To Device Clinic as his wife has been trying to return their call.

## 2020-03-24 NOTE — Telephone Encounter (Signed)
Left message to call back  

## 2020-03-25 NOTE — Telephone Encounter (Signed)
Spoke with patient's wife - unsure since only a few weeks until ablation if he wants to come in for appt. She wanted to talk to him and then call me back.  Wife states only complaint is fatigue but otherwise pt asymptomatic.  Will await return call.

## 2020-03-29 ENCOUNTER — Other Ambulatory Visit: Payer: Self-pay | Admitting: *Deleted

## 2020-03-29 DIAGNOSIS — I4819 Other persistent atrial fibrillation: Secondary | ICD-10-CM

## 2020-03-29 DIAGNOSIS — Z01812 Encounter for preprocedural laboratory examination: Secondary | ICD-10-CM

## 2020-03-29 NOTE — Progress Notes (Signed)
Bmp/ CBC orders

## 2020-04-02 ENCOUNTER — Ambulatory Visit (HOSPITAL_BASED_OUTPATIENT_CLINIC_OR_DEPARTMENT_OTHER): Payer: Medicare Other | Attending: Internal Medicine | Admitting: Cardiology

## 2020-04-02 ENCOUNTER — Other Ambulatory Visit: Payer: Self-pay

## 2020-04-02 DIAGNOSIS — E161 Other hypoglycemia: Secondary | ICD-10-CM | POA: Diagnosis not present

## 2020-04-02 DIAGNOSIS — G4730 Sleep apnea, unspecified: Secondary | ICD-10-CM

## 2020-04-02 DIAGNOSIS — R0902 Hypoxemia: Secondary | ICD-10-CM | POA: Diagnosis not present

## 2020-04-02 DIAGNOSIS — G4733 Obstructive sleep apnea (adult) (pediatric): Secondary | ICD-10-CM

## 2020-04-05 ENCOUNTER — Other Ambulatory Visit: Payer: Self-pay

## 2020-04-05 ENCOUNTER — Encounter: Payer: Self-pay | Admitting: Anesthesiology

## 2020-04-05 ENCOUNTER — Ambulatory Visit: Payer: Medicare Other | Attending: Anesthesiology | Admitting: Anesthesiology

## 2020-04-05 DIAGNOSIS — F1190 Opioid use, unspecified, uncomplicated: Secondary | ICD-10-CM

## 2020-04-05 DIAGNOSIS — M5136 Other intervertebral disc degeneration, lumbar region: Secondary | ICD-10-CM

## 2020-04-05 DIAGNOSIS — M47816 Spondylosis without myelopathy or radiculopathy, lumbar region: Secondary | ICD-10-CM

## 2020-04-05 DIAGNOSIS — M79642 Pain in left hand: Secondary | ICD-10-CM

## 2020-04-05 DIAGNOSIS — M5431 Sciatica, right side: Secondary | ICD-10-CM

## 2020-04-05 DIAGNOSIS — M1611 Unilateral primary osteoarthritis, right hip: Secondary | ICD-10-CM

## 2020-04-05 DIAGNOSIS — M5416 Radiculopathy, lumbar region: Secondary | ICD-10-CM | POA: Diagnosis not present

## 2020-04-05 DIAGNOSIS — G894 Chronic pain syndrome: Secondary | ICD-10-CM

## 2020-04-05 DIAGNOSIS — M5432 Sciatica, left side: Secondary | ICD-10-CM

## 2020-04-05 DIAGNOSIS — M79641 Pain in right hand: Secondary | ICD-10-CM

## 2020-04-05 MED ORDER — HYDROCODONE-ACETAMINOPHEN 7.5-325 MG PO TABS: 1.0000 | ORAL_TABLET | Freq: Four times a day (QID) | ORAL | 0 refills | Status: DC | PRN

## 2020-04-05 MED ORDER — HYDROCODONE-ACETAMINOPHEN 5-325 MG PO TABS: 1.0000 | ORAL_TABLET | Freq: Two times a day (BID) | ORAL | 0 refills | Status: DC | PRN

## 2020-04-05 MED ORDER — HYDROCODONE-ACETAMINOPHEN 7.5-325 MG PO TABS: 1.0000 | ORAL_TABLET | Freq: Two times a day (BID) | ORAL | 0 refills | Status: AC | PRN

## 2020-04-05 NOTE — Progress Notes (Signed)
Virtual Visit via Telephone Note  I connected with Nicholas [name] on 04/05/20 at 12:45 PM EDT by telephone and verified that I am speaking with the correct person using two identifiers.  Location: Patient: Home Provider: Pain control center   I discussed the limitations, risks, security and privacy concerns of performing an evaluation and management service by telephone and the availability of in person appointments. I also discussed with the patient that there may be a patient responsible charge related to this service. The patient expressed understanding and agreed to proceed.   History of Present Illness: I spoke with Nicholas [name] via telephone today regarding his low back pain.  He was unable to do the video portion of virtual conference and reports that he is been doing reasonably well but does have a spot in the posterior lower back which radiates into the right anterior hip and occasionally into the groin.  This is been more troublesome for him.  He is taking his 5 mg hydrocodone tablets and generally doing well with this but the pain has been more troublesome lately.  He has talked to his primary care physician and is currently scheduled also to go and to confirm that there is no evidence of a kidney stone.  He is doing his stretching strengthening exercises as tolerated but has been unable to play golf recently secondary to the increase in pain.  No other changes are noted and his strength is been stable.   Observations/Objective:  Current Outpatient Medications:  .  HYDROcodone-acetaminophen (NORCO) 7.5-325 MG tablet, Take 1 tablet by mouth every 6 (six) hours as needed for moderate pain or severe pain., Disp: 60 tablet, Rfl: 0 .  HYDROcodone-acetaminophen (NORCO) 7.5-325 MG tablet, Take 1 tablet by mouth 2 (two) times daily as needed for moderate pain or severe pain., Disp: 60 tablet, Rfl: 0 .  [START ON 05/05/2020] HYDROcodone-acetaminophen (NORCO/VICODIN) 5-325 MG tablet, Take 1 tablet by  mouth 2 (two) times daily as needed for moderate pain or severe pain., Disp: 60 tablet, Rfl: 0 .  QUEtiapine Fumarate (SEROQUEL XR) 150 MG 24 hr tablet, TAKE 1 TABLET (150 MG TOTAL) BY MOUTH AT BEDTIME., Disp: 90 tablet, Rfl: 4 .  acetaminophen (TYLENOL) 650 MG CR tablet, Take 650 mg by mouth every 8 (eight) hours as needed for pain., Disp: , Rfl:  .  apixaban (ELIQUIS) 5 MG TABS tablet, Take 5 mg by mouth 2 (two) times daily., Disp: , Rfl:  .  Ascorbic Acid (VITAMIN C) 1000 MG tablet, Take 1,000 mg by mouth daily., Disp: , Rfl:  .  Cranberry 500 MG CAPS, Take 500 mg by mouth daily., Disp: , Rfl:  .  diphenhydramine-acetaminophen (TYLENOL PM) 25-500 MG TABS tablet, Take 1 tablet by mouth at bedtime as needed (sleep)., Disp: , Rfl:  .  Ferrous Gluconate (IRON 27 PO), Take 27 mg by mouth daily., Disp: , Rfl:  .  fludrocortisone (FLORINEF) 0.1 MG tablet, Take 0.1 mg by mouth daily., Disp: , Rfl:  .  gabapentin (NEURONTIN) 600 MG tablet, Take 1 tablet (600 mg total) by mouth at bedtime., Disp: 90 tablet, Rfl: 4 .  HYDROcodone-acetaminophen (NORCO/VICODIN) 5-325 MG tablet, Take 1 tablet by mouth in the morning and at bedtime., Disp: , Rfl:  .  hyoscyamine (LEVSIN, ANASPAZ) 0.125 MG tablet, Take 0.125 mg by mouth every 6 (six) hours as needed for bladder spasms or cramping. , Disp: , Rfl:  .  LORazepam (ATIVAN) 1 MG tablet, Take 1 tablet (1 mg total) by  mouth at bedtime., Disp: 90 tablet, Rfl: 0 .  Magnesium 400 MG TABS, Take 400 mg by mouth daily. , Disp: , Rfl:  .  Melatonin 10 MG TABS, Take 10 mg by mouth at bedtime. , Disp: , Rfl:  .  methocarbamol (ROBAXIN) 500 MG tablet, Take 1 tablet (500 mg total) by mouth 2 (two) times daily as needed for muscle spasms., Disp: 60 tablet, Rfl: 2 .  midodrine (PROAMATINE) 10 MG tablet, Take 1 tablet (10 mg total) by mouth 3 (three) times daily. (Patient not taking: Reported on 03/30/2020), Disp: 270 tablet, Rfl: 1 .  midodrine (PROAMATINE) 5 MG tablet, Take 10 mg by  mouth in the morning, at noon, and at bedtime., Disp: , Rfl:  .  Multiple Vitamin (MULTIVITAMIN WITH MINERALS) TABS tablet, Take 1 tablet by mouth daily. Centrum Silver, Disp: , Rfl:  .  Omega-3 Fatty Acids (FISH OIL) 1200 MG CAPS, Take 1,200 mg by mouth daily., Disp: , Rfl:  .  oxymetazoline (AFRIN) 0.05 % nasal spray, Place 2 sprays into both nostrils at bedtime., Disp: , Rfl:  .  pantoprazole (PROTONIX) 20 MG tablet, Take 1 tablet (20 mg total) by mouth daily., Disp: 90 tablet, Rfl: 4 .  potassium chloride SA (KLOR-CON) 20 MEQ tablet, Take 2 tablets (40 meq) x 1 dose as directed (Patient taking differently: Take 40 mEq by mouth daily as needed (with torsemide (fluid retention)).), Disp: 10 tablet, Rfl: 0 .  rOPINIRole (REQUIP) 0.25 MG tablet, Take 0.25 mg by mouth 3 (three) times daily., Disp: , Rfl:  .  rosuvastatin (CRESTOR) 40 MG tablet, Take 1 tablet (40 mg total) by mouth daily., Disp: 90 tablet, Rfl: 4 .  solifenacin (VESICARE) 10 MG tablet, Take 1 tablet (10 mg total) by mouth daily., Disp: 90 tablet, Rfl: 4 .  torsemide (DEMADEX) 20 MG tablet, Take 40 mg by mouth daily as needed (fluid retention)., Disp: , Rfl:  .  triamcinolone cream (KENALOG) 0.1 %, Apply 1 application topically 2 (two) times daily as needed (skin irritation)., Disp: , Rfl:  .  venlafaxine XR (EFFEXOR XR) 75 MG 24 hr capsule, Take 3 capsules (225 mg total) by mouth daily., Disp: 270 capsule, Rfl: 4  Assessment and Plan: 1. Lumbar radiculopathy   2. Degeneration of lumbar intervertebral disc   3. Chronic, continuous use of opioids   4. Chronic pain syndrome   5. Bilateral hand pain   6. Facet syndrome, lumbar   7. Bilateral sciatica   8. Osteoarthritis of right hip, unspecified osteoarthritis type   Based on our discussion today and upon review of the [facility] practitioner database information when to refill his medications over the [name] of the next month I am going to increase his hydrocodone strength to  the 7.5 mg tablets to be taken twice daily dosing dated for today.  His next month prescription will be for the 5 mg strength which is his baseline for 1 month.  He is scheduled for a 81-month return to clinic.  If he should need a trigger point in the meantime he is instructed to contact us.  I want him to continue with the stretching strengthening exercises and follow-up with his primary care physician as mentioned.  All  Follow Up Instructions:    I discussed the assessment and treatment plan with the patient. The patient was provided an opportunity to ask questions and all were answered. The patient agreed with the plan and demonstrated an understanding of the instructions.  The patient was advised to call back or seek an in-person evaluation if the symptoms worsen or if the condition fails to improve as anticipated.  I provided 30 minutes of non-face-to-face time during this encounter.   [name], MD

## 2020-04-06 ENCOUNTER — Telehealth: Payer: Self-pay | Admitting: Emergency Medicine

## 2020-04-06 NOTE — Telephone Encounter (Signed)
LMOM to call device clinic with # and office hours.  Follow-up on condition and get remote to see if in AF.

## 2020-04-06 NOTE — Telephone Encounter (Signed)
-----   Message from Edd Arbour, [place] sent at 04/05/2020  4:12 PM EDT ----- Joseph Art sent this to me and would like for someone to follow up with this patient.

## 2020-04-07 ENCOUNTER — Ambulatory Visit (INDEPENDENT_AMBULATORY_CARE_PROVIDER_SITE_OTHER): Payer: Medicare Other | Admitting: Nurse Practitioner

## 2020-04-07 ENCOUNTER — Other Ambulatory Visit: Payer: Self-pay

## 2020-04-07 ENCOUNTER — Encounter: Payer: Self-pay | Admitting: Nurse Practitioner

## 2020-04-07 VITALS — BP 114/77 | HR 102 | Temp 98.0°F | Wt 274.8 lb

## 2020-04-07 DIAGNOSIS — R399 Unspecified symptoms and signs involving the genitourinary system: Secondary | ICD-10-CM | POA: Diagnosis not present

## 2020-04-07 DIAGNOSIS — R8281 Pyuria: Secondary | ICD-10-CM

## 2020-04-07 DIAGNOSIS — N390 Urinary tract infection, site not specified: Secondary | ICD-10-CM | POA: Insufficient documentation

## 2020-04-07 LAB — MICROSCOPIC EXAMINATION: RBC, Urine: NONE SEEN /hpf (ref 0–2)

## 2020-04-07 LAB — URINALYSIS, ROUTINE W REFLEX MICROSCOPIC
Bilirubin, UA: NEGATIVE
Glucose, UA: NEGATIVE
Ketones, UA: NEGATIVE
Nitrite, UA: NEGATIVE
Protein,UA: NEGATIVE
RBC, UA: NEGATIVE
Specific Gravity, UA: 1.025 (ref 1.005–1.030)
Urobilinogen, Ur: 0.2 mg/dL (ref 0.2–1.0)
pH, UA: 6 (ref 5.0–7.5)

## 2020-04-07 MED ORDER — AMOXICILLIN-POT CLAVULANATE 875-125 MG PO TABS: 1.0000 | ORAL_TABLET | Freq: Two times a day (BID) | ORAL | 0 refills | Status: AC

## 2020-04-07 NOTE — Assessment & Plan Note (Signed)
Acute for 2 weeks -- UA today with neg BLD and KET + neg NIT -- has 3+ LEUK and moderate bacteria + 11-30 WBC.  Will treat at this time with Augmentin, script sent and plan to send urine for culture.  Since is scheduled for surgical procedure on Monday, start abx now to prevent worsening and discussed with patient if culture warrants will change to alternate treatment as needed.  Continue daily cranberry tablets + increase fluid intake.  Return to office in April as scheduled and will recheck urine.

## 2020-04-07 NOTE — Patient Instructions (Signed)
Urinary Tract Infection, Adult A urinary tract infection (UTI) is an infection of any part of the urinary tract. The urinary tract includes:  The kidneys.  The ureters.  The bladder.  The urethra. These organs make, store, and get rid of pee (urine) in the body. What are the causes? This infection is caused by germs (bacteria) in your genital area. These germs grow and cause swelling (inflammation) of your urinary tract. What increases the risk? The following factors may make you more likely to develop this condition:  Using a small, thin tube (catheter) to drain pee.  Not being able to control when you pee or poop (incontinence).  Being male. If you are male, these things can increase the risk: ? Using these methods to prevent pregnancy:  A medicine that kills sperm (spermicide).  A device that blocks sperm (diaphragm). ? Having low levels of a male hormone (estrogen). ? Being pregnant. You are more likely to develop this condition if:  You have genes that add to your risk.  You are sexually active.  You take antibiotic medicines.  You have trouble peeing because of: ? A prostate that is bigger than normal, if you are male. ? A blockage in the part of your body that drains pee from the bladder. ? A kidney stone. ? A nerve condition that affects your bladder. ? Not getting enough to drink. ? Not peeing often enough.  You have other conditions, such as: ? Diabetes. ? A weak disease-fighting system (immune system). ? Sickle cell disease. ? Gout. ? Injury of the spine. What are the signs or symptoms? Symptoms of this condition include:  Needing to pee right away.  Peeing small amounts often.  Pain or burning when peeing.  Blood in the pee.  Pee that smells bad or not like normal.  Trouble peeing.  Pee that is cloudy.  Fluid coming from the vagina, if you are male.  Pain in the belly or lower back. Other symptoms include:  Vomiting.  Not  feeling hungry.  Feeling mixed up (confused). This may be the first symptom in older adults.  Being tired and grouchy (irritable).  A fever.  Watery poop (diarrhea). How is this treated?  Taking antibiotic medicine.  Taking other medicines.  Drinking enough water. In some cases, you may need to see a specialist. Follow these instructions at home: Medicines  Take over-the-counter and prescription medicines only as told by your doctor.  If you were prescribed an antibiotic medicine, take it as told by your doctor. Do not stop taking it even if you start to feel better. General instructions  Make sure you: ? Pee until your bladder is empty. ? Do not hold pee for a long time. ? Empty your bladder after sex. ? Wipe from front to back after peeing or pooping if you are a male. Use each tissue one time when you wipe.  Drink enough fluid to keep your pee pale yellow.  Keep all follow-up visits.   Contact a doctor if:  You do not get better after 1-2 days.  Your symptoms go away and then come back. Get help right away if:  You have very bad back pain.  You have very bad pain in your lower belly.  You have a fever.  You have chills.  You feeling like you will vomit or you vomit. Summary  A urinary tract infection (UTI) is an infection of any part of the urinary tract.  This condition is caused by   germs in your genital area.  There are many risk factors for a UTI.  Treatment includes antibiotic medicines.  Drink enough fluid to keep your pee pale yellow. This information is not intended to replace advice given to you by your health care provider. Make sure you discuss any questions you have with your health care provider. Document Revised: 08/22/2019 Document Reviewed: 08/22/2019 Elsevier Patient Education  2021 [employer] Inc.  

## 2020-04-07 NOTE — Progress Notes (Signed)
BP 114/77   Pulse (!) 102   Temp 98 F (36.7 C) (Oral)   Wt 274 lb 12.8 oz (124.6 kg)   SpO2 94%   BMI 38.33 kg/m    Subjective:    Patient ID: Nicholas [name], male    DOB: May 18, 1945, 75 y.o.   MRN: [id number]  HPI: Nicholas [name] is a 75 y.o. male  Chief Complaint  Patient presents with  . Back Pain    Patient states he has back pain on the right side, but when he gets up to move around the pain radiates around to the front in the lower abdomen area. Patient states when he stands on the right just feels as if something is pushing and it is painful.    URINARY SYMPTOMS Started with back pain to right side about 2 1/2 weeks ago, when he moves this pain radiates around to the front of the abdomen -- feels as if something is pushing and painful.  Has stopped him from playing golf.  Does follow Dr. Andree Elk for chronic pain -- he talked to him about this Monday.  He reports having a regular bowel regimen with no constipation.  Recent UTI treatment on 02/11/20 for enterococcus treated with Augmentin successfully.    He is scheduled for ablation for a-fib on Monday. Dysuria: no Urinary frequency: no Urgency: no Small volume voids: no Symptom severity: yes Urinary incontinence: no Foul odor: no Hematuria: no Abdominal pain: yes Back pain: yes Suprapubic pain/pressure: no Flank pain: yes Fever:  no Vomiting: no Status: stable Previous urinary tract infection: yes Recurrent urinary tract infection: no Sexual activity: monogomous History of sexually transmitted disease: no Treatments attempted: none   Relevant past medical, surgical, family and social history reviewed and updated as indicated. Interim medical history since our last visit reviewed. Allergies and medications reviewed and updated.  Review of Systems  Constitutional: Negative for activity change, appetite change, fatigue and fever.  Respiratory: Negative for cough, chest tightness, shortness of breath and wheezing.    Cardiovascular: Negative for chest pain, palpitations and leg swelling.  Gastrointestinal: Positive for abdominal pain. Negative for abdominal distention, blood in stool, constipation, diarrhea, nausea and vomiting.  Genitourinary: Negative for dysuria, frequency, hematuria, testicular pain and urgency.  Musculoskeletal: Positive for back pain.  Neurological: Negative.   Psychiatric/Behavioral: Negative.     Per HPI unless specifically indicated above     Objective:    BP 114/77   Pulse (!) 102   Temp 98 F (36.7 C) (Oral)   Wt 274 lb 12.8 oz (124.6 kg)   SpO2 94%   BMI 38.33 kg/m   Wt Readings from Last 3 Encounters:  04/07/20 274 lb 12.8 oz (124.6 kg)  02/27/20 274 lb (124.3 kg)  02/24/20 276 lb (125.2 kg)    Physical Exam Vitals and nursing note reviewed.  Constitutional:      General: He is awake. He is not in acute distress.    Appearance: He is well-developed and well-groomed. He is obese.  HENT:     Head: Normocephalic and atraumatic.     Right Ear: Hearing normal. No drainage.     Left Ear: Hearing normal. No drainage.  Eyes:     General: Lids are normal.        Right eye: No discharge.        Left eye: No discharge.     Conjunctiva/sclera: Conjunctivae normal.     Pupils: Pupils are equal, round, and reactive to  light.  Neck:     Thyroid: No thyromegaly.     Vascular: No carotid bruit.  Cardiovascular:     Rate and Rhythm: Normal rate and regular rhythm.     Heart sounds: Normal heart sounds, S1 normal and S2 normal. No murmur heard. No gallop.   Pulmonary:     Effort: Pulmonary effort is normal. No accessory muscle usage or respiratory distress.     Breath sounds: Normal breath sounds.  Abdominal:     General: Bowel sounds are normal. There is no distension.     Palpations: Abdomen is soft. There is no hepatomegaly.     Tenderness: There is abdominal tenderness in the suprapubic area. There is right CVA tenderness. There is no left CVA tenderness or  guarding. Negative signs include Murphy's sign.     Hernia: No hernia is present.  Musculoskeletal:        General: Normal range of motion.     Cervical back: Normal range of motion and neck supple.     Right lower leg: No edema.     Left lower leg: No edema.  Skin:    General: Skin is warm and dry.     Findings: No rash.  Neurological:     Mental Status: He is alert and oriented to person, place, and time.  Psychiatric:        Attention and Perception: Attention normal.        Mood and Affect: Mood normal.        Behavior: Behavior normal. Behavior is cooperative.        Thought Content: Thought content normal.    Results for orders placed or performed in visit on 03/15/20  CUP PACEART REMOTE DEVICE CHECK  Result Value Ref Range   Date Time Interrogation Session [phone]    Pulse Generator Manufacturer MERM    Pulse Gen Model A2DR01 Advisa DR MRI    Pulse Gen Serial Number [id number]    Clinic Name [facility]    Implantable Pulse Generator Type Implantable Pulse Generator    Implantable Pulse Generator Implant Date [id number]    Implantable Lead Manufacturer MERM    Implantable Lead Model 3830 SelectSecure MRI SureScan    Implantable Lead Serial Number [id number]    Implantable Lead Implant Date 88502774    Implantable Lead Location Detail 1 UNKNOWN    Implantable Lead Special Function Bundle of His    Implantable Lead Location [id number]    Implantable Lead Manufacturer MERM    Implantable Lead Model 5076 CapSureFix Novus MRI SureScan    Implantable Lead Serial Number [id number]    Implantable Lead Implant Date [id number]    Implantable Lead Location Detail 1 APPENDAGE    Implantable Lead Location [id number]    Lead Channel Setting Sensing Sensitivity 0.6 mV   Lead Channel Setting Pacing Amplitude 2 V   Lead Channel Setting Pacing Pulse Width 1 ms   Lead Channel Setting Pacing Amplitude 2.25 V   Lead Channel Impedance Value 361 ohm   Lead Channel Impedance Value 342 ohm    Lead Channel Sensing Intrinsic Amplitude 1.375 mV   Lead Channel Sensing Intrinsic Amplitude 1.375 mV   Lead Channel Pacing Threshold Amplitude 0.5 V   Lead Channel Pacing Threshold Pulse Width 0.4 ms   Lead Channel Impedance Value 456 ohm   Lead Channel Impedance Value 342 ohm   Lead Channel Sensing Intrinsic Amplitude 1.5 mV   Lead Channel Sensing Intrinsic Amplitude 1.5 mV   Lead Channel  Pacing Threshold Amplitude 0.75 V   Lead Channel Pacing Threshold Pulse Width 0.4 ms   Battery Status OK    Battery Remaining Longevity 39 mo   Battery Voltage 2.97 V   Brady Statistic RA Percent Paced 34.68 %   Brady Statistic RV Percent Paced 98.48 %   Brady Statistic AP VP Percent 37.99 %   Brady Statistic AS VP Percent 60.6 %   Brady Statistic AP VS Percent 0.09 %   Brady Statistic AS VS Percent 1.32 %      Assessment & Plan:   Problem List Items Addressed This Visit      Other   Urinary symptom or sign - Primary    Acute for 2 weeks -- UA today with neg BLD and KET + neg NIT -- has 3+ LEUK and moderate bacteria + 11-30 WBC.  Will treat at this time with Augmentin, script sent and plan to send urine for culture.  Since is scheduled for surgical procedure on Monday, start abx now to prevent worsening and discussed with patient if culture warrants will change to alternate treatment as needed.  Continue daily cranberry tablets + increase fluid intake.  Return to office in April as scheduled and will recheck urine.      Relevant Orders   Urinalysis, Routine w reflex microscopic    Other Visit Diagnoses    Pyuria       Send urine for culture.   Relevant Orders   Urine Culture       Follow up plan: Return for as scheduled in April -- will recheck urine.

## 2020-04-08 ENCOUNTER — Other Ambulatory Visit
Admission: RE | Admit: 2020-04-08 | Discharge: 2020-04-08 | Disposition: A | Discharge status: Home / Self Care | Payer: Medicare Other | Source: Home / Self Care | Attending: Cardiology | Admitting: Cardiology

## 2020-04-08 ENCOUNTER — Other Ambulatory Visit
Admission: RE | Admit: 2020-04-08 | Discharge: 2020-04-08 | Disposition: A | Discharge status: Home / Self Care | Payer: Medicare Other | Source: Ambulatory Visit | Attending: Cardiology | Admitting: Cardiology

## 2020-04-08 ENCOUNTER — Other Ambulatory Visit: Payer: Self-pay

## 2020-04-08 DIAGNOSIS — Z01812 Encounter for preprocedural laboratory examination: Secondary | ICD-10-CM

## 2020-04-08 DIAGNOSIS — Z20822 Contact with and (suspected) exposure to covid-19: Secondary | ICD-10-CM | POA: Insufficient documentation

## 2020-04-08 DIAGNOSIS — I4819 Other persistent atrial fibrillation: Secondary | ICD-10-CM | POA: Insufficient documentation

## 2020-04-08 LAB — CBC WITH DIFFERENTIAL/PLATELET
Abs Immature Granulocytes: 0.03 10*3/uL (ref 0.00–0.07)
Basophils Absolute: 0.1 10*3/uL (ref 0.0–0.1)
Basophils Relative: 1 %
Eosinophils Absolute: 0.3 10*3/uL (ref 0.0–0.5)
Eosinophils Relative: 5 %
HCT: 48.1 % (ref 39.0–52.0)
Hemoglobin: 15.9 g/dL (ref 13.0–17.0)
Immature Granulocytes: 1 %
Lymphocytes Relative: 23 %
Lymphs Abs: 1.4 10*3/uL (ref 0.7–4.0)
MCH: 30.3 pg (ref 26.0–34.0)
MCHC: 33.1 g/dL (ref 30.0–36.0)
MCV: 91.8 fL (ref 80.0–100.0)
Monocytes Absolute: 0.5 10*3/uL (ref 0.1–1.0)
Monocytes Relative: 8 %
Neutro Abs: 3.8 10*3/uL (ref 1.7–7.7)
Neutrophils Relative %: 62 %
Platelets: 171 10*3/uL (ref 150–400)
RBC: 5.24 MIL/uL (ref 4.22–5.81)
RDW: 12.9 % (ref 11.5–15.5)
WBC: 6 10*3/uL (ref 4.0–10.5)
nRBC: 0 % (ref 0.0–0.2)

## 2020-04-08 LAB — BASIC METABOLIC PANEL
Anion gap: 9 (ref 5–15)
BUN: 21 mg/dL (ref 8–23)
CO2: 26 mmol/L (ref 22–32)
Calcium: 9.6 mg/dL (ref 8.9–10.3)
Chloride: 104 mmol/L (ref 98–111)
Creatinine, Ser: 1.1 mg/dL (ref 0.61–1.24)
GFR, Estimated: >60 mL/min (ref >60)
Glucose, Bld: 189 mg/dL — ABNORMAL HIGH (ref 70–99)
Potassium: 4.5 mmol/L (ref 3.5–5.1)
Sodium: 139 mmol/L (ref 135–145)

## 2020-04-08 LAB — SARS CORONAVIRUS 2 (TAT 6-24 HRS): SARS Coronavirus 2: NEGATIVE

## 2020-04-08 NOTE — Telephone Encounter (Signed)
LMOM to call device clinic with #. Need transmission to assess for AF.

## 2020-04-12 ENCOUNTER — Telehealth: Payer: Self-pay

## 2020-04-12 ENCOUNTER — Encounter (HOSPITAL_COMMUNITY): Payer: Self-pay | Admitting: Certified Registered Nurse Anesthetist

## 2020-04-12 ENCOUNTER — Encounter (HOSPITAL_COMMUNITY)
Admission: RE | Disposition: A | Discharge status: Home / Self Care | Payer: Medicare Other | Source: Ambulatory Visit | Attending: Cardiology

## 2020-04-12 ENCOUNTER — Other Ambulatory Visit: Payer: Self-pay | Admitting: Nurse Practitioner

## 2020-04-12 ENCOUNTER — Ambulatory Visit (HOSPITAL_COMMUNITY)
Admission: RE | Admit: 2020-04-12 | Discharge: 2020-04-12 | Disposition: A | Discharge status: Home / Self Care | Payer: Medicare Other | Source: Ambulatory Visit | Attending: Cardiology | Admitting: Cardiology

## 2020-04-12 DIAGNOSIS — Z7901 Long term (current) use of anticoagulants: Secondary | ICD-10-CM | POA: Insufficient documentation

## 2020-04-12 DIAGNOSIS — Z950 Presence of cardiac pacemaker: Secondary | ICD-10-CM | POA: Diagnosis not present

## 2020-04-12 DIAGNOSIS — I442 Atrioventricular block, complete: Secondary | ICD-10-CM | POA: Diagnosis not present

## 2020-04-12 DIAGNOSIS — I2510 Atherosclerotic heart disease of native coronary artery without angina pectoris: Secondary | ICD-10-CM | POA: Insufficient documentation

## 2020-04-12 DIAGNOSIS — R1084 Generalized abdominal pain: Secondary | ICD-10-CM

## 2020-04-12 DIAGNOSIS — Z79899 Other long term (current) drug therapy: Secondary | ICD-10-CM | POA: Insufficient documentation

## 2020-04-12 DIAGNOSIS — I4819 Other persistent atrial fibrillation: Secondary | ICD-10-CM | POA: Insufficient documentation

## 2020-04-12 DIAGNOSIS — Z539 Procedure and treatment not carried out, unspecified reason: Secondary | ICD-10-CM | POA: Insufficient documentation

## 2020-04-12 LAB — URINE CULTURE

## 2020-04-12 SURGERY — ATRIAL FIBRILLATION ABLATION
Anesthesia: General

## 2020-04-12 MED ORDER — SODIUM CHLORIDE 0.9 % IV SOLN: INTRAVENOUS | Status: DC

## 2020-04-12 MED ORDER — CEFAZOLIN SODIUM-DEXTROSE 2-4 GM/100ML-% IV SOLN
INTRAVENOUS | Status: AC
  Filled 2020-04-12: qty 100

## 2020-04-12 NOTE — H&P (Signed)
Electrophysiology Office Note:    Date:  02/27/2020   ID:  Nicholas [name], DOB Jul 15, 1945, MRN [id number]  PCP:  Venita Lick, NP        [facility] Cardiologist:  No primary care provider on file.  [facility] Electrophysiologist: Jolyn Nap, MD  Referring MD: Venita Lick, NP   Chief Complaint: Atrial fibrillation  History of Present Illness:    Nicholas [name] is a 75 y.o. male who presents for an evaluation of atrial fibrillation at the request of Dr. Jolyn Nap. Their medical history includes anxiety, complete heart block post Medtronic dual-chamber permanent pacemaker, DVT, hypertension, hyperlipidemia.  He last saw Dr. Caryl Comes February 24, 2020 for his atrial fibrillation.  He has previously been managed with flecainide but given coronary artery disease noted on CT scan and this was discontinued and he was trialed on propafenone.  This was not very effective and was recently discontinued.  He is also having orthostatic intolerance that has been related to his medication use.  There is also concern that his atrial fibrillation is contributing to his generalized malaise and exercise intolerance.  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  He tells me that other than the fatigue he cannot really tell when he is in or out of rhythm.  He is currently awaiting a sleep study.  He is working on weight reduction.      Past Medical History:  Diagnosis Date  . Anterior urethral stricture   . Anxiety   . Arthritis    a. knees, hips, hands;  b. 11/2013 s/p L TKA @ [place].  . Bile reflux gastritis   . Bulging lumbar disc   . BXO (balanitis xerotica obliterans)   . 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 Depression   . DVT (deep venous thrombosis) ([place])   . Erosive esophagitis   . Gross hematuria   . Hyperlipemia   . Hypertension    borderline  . Internal hemorrhoids   .  Phimosis   . Pulmonary embolism [facility])          Past Surgical History:  Procedure Laterality Date  . BUNIONECTOMY Bilateral 01/06/2015   Procedure: BUNIONECTOMY;  Surgeon: [name], MD;  Location: [facility] ORS;  Service: Orthopedics;  Laterality: Bilateral;  . CARDIAC CATHETERIZATION  ~ 2005   "once"  . CATARACT EXTRACTION W/ INTRAOCULAR LENS  IMPLANT, BILATERAL Bilateral ~ 2010  . COLONOSCOPY WITH PROPOFOL N/A 12/07/2015   Procedure: COLONOSCOPY WITH PROPOFOL;  Surgeon: Lollie Sails, MD;  Location: [facility] ENDOSCOPY;  Service: Endoscopy;  Laterality: N/A;  . EP IMPLANTABLE DEVICE N/A 02/16/2016   MDT dual chamber (His Bundle) pacemaker implanted by Dr Caryl Comes for intermittent complete heart block  . ESOPHAGOGASTRODUODENOSCOPY (EGD) WITH PROPOFOL N/A 12/07/2015   Procedure: ESOPHAGOGASTRODUODENOSCOPY (EGD) WITH PROPOFOL;  Surgeon: Lollie Sails, MD;  Location: [facility] ENDOSCOPY;  Service: Endoscopy;  Laterality: N/A;  . ESOPHAGOGASTRODUODENOSCOPY (EGD) WITH PROPOFOL N/A 05/17/2017   Procedure: ESOPHAGOGASTRODUODENOSCOPY (EGD) WITH PROPOFOL;  Surgeon: Lollie Sails, MD;  Location: [facility] ENDOSCOPY;  Service: Endoscopy;  Laterality: N/A;  . HAMMER TOE SURGERY Bilateral 01/06/2015   Procedure: HAMMER TOE CORRECTION;  Surgeon: [name], MD;  Location: [facility] ORS;  Service: Orthopedics;  Laterality: Bilateral;  . KNEE CARTILAGE SURGERY Left 1965   "football injury"  . Left Total Knee Arthroplasty     a. 11/2013 [facility].  Marland Kitchen  PILONIDAL CYST EXCISION  1970's  . TOTAL HIP ARTHROPLASTY Right 2004  . TOTAL HIP ARTHROPLASTY Left 2006  . UPPER GI ENDOSCOPY      Current Medications: Active Medications      Current Meds  Medication Sig  . acetaminophen (TYLENOL) 650 MG CR tablet Take 650 mg by mouth daily.   Marland Kitchen apixaban (ELIQUIS) 5 MG TABS tablet Take 5 mg by mouth 2 (two) times daily.  Marland Kitchen ascorbic acid (VITAMIN C) 250 MG tablet Take 1,000 mg by mouth daily.   . Cranberry  500 MG CAPS Take 1 capsule by mouth daily.   . ferrous sulfate 325 (65 FE) MG tablet Take 325 mg by mouth daily with breakfast.  . gabapentin (NEURONTIN) 600 MG tablet Take 1 tablet (600 mg total) by mouth at bedtime.  Marland Kitchen HYDROcodone-acetaminophen (NORCO/VICODIN) 5-325 MG tablet Take 1 tablet by mouth 2 (two) times daily as needed. (Patient taking differently: Take 1 tablet by mouth 2 (two) times daily.)  . hyoscyamine (LEVSIN, ANASPAZ) 0.125 MG tablet Take 0.125 mg by mouth every 6 (six) hours as needed for bladder spasms or cramping.   Marland Kitchen LORazepam (ATIVAN) 1 MG tablet Take 1 tablet (1 mg total) by mouth at bedtime.  . Magnesium 400 MG TABS Take 400 mg by mouth daily.   . Melatonin 10 MG TABS Take 10 mg by mouth at bedtime.   . methocarbamol (ROBAXIN) 500 MG tablet Take 1 tablet (500 mg total) by mouth 2 (two) times daily as needed for muscle spasms.  . midodrine (PROAMATINE) 10 MG tablet Take 1 tablet (10 mg total) by mouth 3 (three) times daily.  . Multiple Vitamin (MULTI-VITAMINS) TABS Take 1 tablet by mouth daily.   . Omega-3 Fatty Acids (FISH OIL) 1200 MG CAPS Take 1,200 mg by mouth daily.  Marland Kitchen oxymetazoline (AFRIN) 0.05 % nasal spray Place 2 sprays into both nostrils at bedtime as needed for congestion.  . pantoprazole (PROTONIX) 20 MG tablet Take 1 tablet (20 mg total) by mouth daily.  . potassium chloride SA (KLOR-CON) 20 MEQ tablet Take 2 tablets (40 meq) x 1 dose as directed  . QUEtiapine Fumarate (SEROQUEL XR) 150 MG 24 hr tablet TAKE 1 TABLET (150 MG TOTAL) BY MOUTH AT BEDTIME.  . rosuvastatin (CRESTOR) 40 MG tablet Take 1 tablet (40 mg total) by mouth daily.  . solifenacin (VESICARE) 10 MG tablet Take 1 tablet (10 mg total) by mouth daily.  Marland Kitchen torsemide (DEMADEX) 20 MG tablet Take 20 mg by mouth daily as needed.  . triamcinolone cream (KENALOG) 0.1 % Apply 1 application topically 2 (two) times daily.   Marland Kitchen venlafaxine XR (EFFEXOR XR) 75 MG 24 hr capsule Take 3 capsules (225 mg total) by  mouth daily.       Allergies:   Patient has no known allergies.   Social History        Socioeconomic History  . Marital status: Married    Spouse name: Not on file  . Number of children: Not on file  . Years of education: Not on file  . Highest education level: High school graduate  Occupational History  . Occupation: retired   Tobacco Use  . Smoking status: Never Smoker  . Smokeless tobacco: Never Used  Vaping Use  . Vaping Use: Never used  Substance and Sexual Activity  . Alcohol use: Yes    Alcohol/week: 0.0 standard drinks    Comment: beers seldom   . Drug use: No  . Sexual activity: Not  Currently  Other Topics Concern  . Not on file  Social History Narrative   Lives in [place] with wife.  Does not routinely exercise.  Activity severely limited by bilateral knee pain.   Social Determinants of Health      Financial Resource Strain: Medium Risk  . Difficulty of Paying Living Expenses: Somewhat hard  Food Insecurity: No Food Insecurity  . Worried About Charity fundraiser in the Last Year: Never true  . Ran Out of Food in the Last Year: Never true  Transportation Needs: No Transportation Needs  . Lack of Transportation (Medical): No  . Lack of Transportation (Non-Medical): No  Physical Activity: Inactive  . Days of Exercise per Week: 0 days  . Minutes of Exercise per Session: 0 min  Stress: Not on file  Social Connections: Moderately Integrated  . Frequency of Communication with Friends and Family: More than three times a week  . Frequency of Social Gatherings with Friends and Family: More than three times a week  . Attends Religious Services: More than 4 times per year  . Active Member of Clubs or Organizations: No  . Attends Archivist Meetings: Never  . Marital Status: Married     Family History: The patient's family history includes Alcoholism in his mother; Breast cancer in his sister; Cancer in his mother; Diabetes in his father;  Glaucoma in his sister; Hypertension in his father; Prostate cancer in his paternal grandfather; Stroke in his father.  ROS:   Please see the history of present illness.    All other systems reviewed and are negative.  EKGs/Labs/Other Studies Reviewed:    The following studies were reviewed today:  February 24, 2020 device check Pacemaker check in clinic. Normal device function. HiS bundle pacing. Thresholds, sensing, impedances consistent with previous measurements. Device programmed to maximize longevity. AT/AF Burden 26.9%, +OAC. No high ventricular rates noted. Device  programmed at appropriate safety margins. Histogram distribution appropriate for patient activity level. Device programmed to optimize intrinsic conduction. Estimated longevity 3 years. Patient enrolled in remote follow-up, next scheduled check 03/15/20.   November 12, 2019 echo personally reviewed Left ventricular function normal, 60% Right ventricular function normal Mild left atrial dilation No significant valvular abnormalities   EKG:  The ekg ordered today demonstrates His bundle pacing, sinus rhythm  Recent Labs: 11/10/2019: Magnesium 2.3; TSH 4.140 11/22/2019: ALT 19; Hemoglobin 13.6; Platelets 160 02/11/2020: BUN 14; Creatinine, Ser 1.34; Potassium 4.2; Sodium 141  Recent Lipid Panel Labs (Brief)          Component Value Date/Time   CHOL 171 02/11/2020 0942   CHOL 198 01/03/2018 1610   TRIG 147 02/11/2020 0942   TRIG 264 (H) 01/03/2018 1610   HDL 61 02/11/2020 0942   CHOLHDL 3.6 07/16/2018 0813   VLDL 53 (H) 01/03/2018 1610   LDLCALC 85 02/11/2020 0942      Physical Exam:    VS:  BP 108/72   Pulse 89   Ht 5\' 11"  (1.803 m)   Wt 274 lb (124.3 kg)   BMI 38.22 kg/m        Wt Readings from Last 3 Encounters:  02/27/20 274 lb (124.3 kg)  02/24/20 276 lb (125.2 kg)  02/11/20 275 lb 12.8 oz (125.1 kg)     GEN: Well nourished, well developed in no acute distress.   Obese HEENT: Normal NECK: No JVD; No carotid bruits LYMPHATICS: No lymphadenopathy CARDIAC: RRR, no murmurs, rubs, gallops RESPIRATORY:  Clear to auscultation without rales,  wheezing or rhonchi  ABDOMEN: Soft, non-tender, non-distended MUSCULOSKELETAL:  No edema; No deformity  SKIN: Warm and dry NEUROLOGIC:  Alert and oriented x 3 PSYCHIATRIC:  Normal affect   ASSESSMENT:    1. Persistent atrial fibrillation (HCC)   2. Cardiac pacemaker in situ   3. Complete heart block (HCC)    PLAN:    In order of problems listed above:  1. Symptomatic persistent atrial fibrillation Previously managed with flecainide but this was stopped due to coronary artery disease.  Propafenone was ineffective and was thought to be contributing to his orthostatic intolerance.  He is symptomatic with fatigue and exercise intolerance. He takes Eliquis for stroke prophylaxis without bleeding issues. Discussed management options for his atrial fibrillation including alternative antiarrhythmic therapy versus ablation.  I discussed the risks, expected recovery, expected efficacy of both options and he would like to proceed with scheduling an ablation. We spent a great deal of time during today's appointment discussing the possibility that restoring normal rhythm may not improve his symptoms but given how severe his exercise intolerance is, I do think is very reasonable to pursue a rhythm control strategy. Risk, benefits, and alternatives to EP study and radiofrequency ablation for afib were also discussed in detail today. These risks include but are not limited to stroke, bleeding, vascular damage, tamponade, perforation, damage to the esophagus, lungs, and other structures, pulmonary vein stenosis, worsening renal function, and death. The patient understands these risk and wishes to proceed.  We will therefore proceed with catheter ablation at the next available time.  Carto, ICE, anesthesia are requested for the  procedure.    We do not need a repeat CT scan given the PE protocol CT of the chest recently shows the pulmonary vein anatomy.  2.  Pacemaker His bundle dual-chamber permanent pacemaker in situ.  Well-functioning.  3.  Complete heart block See #2 above.   ---------------------------------------------------------------------- I have seen, examined the patient, and reviewed the above assessment and plan.    Patient with continued urinary tract infection symptoms and right flank pain radiating to the groin. From his history, I am concerned he may have an undiagnosed kidney stone with superimposed infection. I think this should be worked up before we proceed with AF ablation. Will send note to his primary care physician regarding imaging, further eval. We will get him scheduled with me in clinic to discuss rescheduling.   Vickie Epley, MD 04/12/2020 10:22 AM

## 2020-04-12 NOTE — Telephone Encounter (Signed)
Copied from [place] [id number]. Topic: Referral - Request for Referral >> Apr 12, 2020 11:13 AM Scherrie Gerlach wrote: Pt's heart dr, Dr Quentin Ore, would not do the pt's ablation this am because he thinks he may have kidney stones.   Pt also has UTI. Dr Quentin Ore would like Jolene to order a CT scan o rule out stones.

## 2020-04-12 NOTE — Progress Notes (Signed)
Urgent CT scan abdomen to assess for kidney stones, ablation cancelled this morning due to ongoing flank pain.

## 2020-04-12 NOTE — Telephone Encounter (Signed)
He is scheduled 04/28/20. Thank you :)

## 2020-04-12 NOTE — Telephone Encounter (Signed)
Copied from [place] [phone]. Topic: Referral - Request for Referral >> Apr 12, 2020 11:13 AM Scherrie Gerlach wrote: Pt's heart dr, Dr Quentin Ore, would not do the pt's ablation this am because he thinks he may have kidney stones.   Pt also has UTI. Dr Quentin Ore would like Jolene to order a CT scan o rule out stones.     Per PCP pt needs apt, Called pt no answer,

## 2020-04-12 NOTE — Telephone Encounter (Signed)
Stafff message sent per PCP to schdule apt, Pt did not answer.

## 2020-04-12 NOTE — Telephone Encounter (Signed)
Wonderful.  Thank you Janett Billow.:)

## 2020-04-12 NOTE — Progress Notes (Signed)
Pt procedure canceled after pt ready, PIV d/c'd, wife notified, will continue to monitor

## 2020-04-12 NOTE — Anesthesia Preprocedure Evaluation (Deleted)
Anesthesia Evaluation  Patient identified by MRN, date of birth, ID band Patient awake    Reviewed: Allergy & Precautions, NPO status , Patient's Chart, lab work & pertinent test results  Airway Mallampati: II  TM Distance: >3 FB Neck ROM: Full    Dental  (+) Teeth Intact   Pulmonary PE   Pulmonary exam normal        Cardiovascular hypertension, Pt. 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 Bowel sounds: normal.  Peds  Hematology negative hematology ROS (+)   Anesthesia Other Findings   Reproductive/Obstetrics                            Anesthesia Physical Anesthesia Plan  ASA: III  Anesthesia Plan: General   Post-op Pain Management:    Induction: Intravenous  PONV Risk Score and Plan: 2 and Ondansetron, Dexamethasone and Treatment may vary due to age or medical condition  Airway Management Planned: Mask and Oral ETT  Additional Equipment: None  Intra-op Plan:   Post-operative Plan: Extubation in OR  Informed Consent: I have reviewed the patients History and Physical, chart, labs and discussed the procedure including the risks, benefits and alternatives for the proposed anesthesia with the patient or authorized representative who has indicated his/her understanding and acceptance.     Dental advisory given  Plan Discussed with: CRNA  Anesthesia Plan Comments: (ECHO 2021: 1. Left ventricular ejection fraction, by estimation, is 60 to 65%. The  left ventricle has normal function. The left ventricle has no  regional  wall motion abnormalities. Left ventricular diastolic parameters are  consistent with Grade I diastolic  dysfunction (impaired relaxation).  2. Right ventricular systolic function is normal. The right ventricular  size is mildly enlarged. Tricuspid regurgitation signal is inadequate for  assessing PA pressure.  3. Left atrial size was mildly dilated.  4. The mitral valve is normal in structure. No evidence of mitral valve  regurgitation. No evidence of mitral stenosis.  5. The aortic valve is tricuspid. Aortic valve regurgitation is not  visualized. Mild aortic valve sclerosis is present, with no evidence of  aortic valve stenosis.  6. Aortic dilatation noted. There is mild dilatation of the aortic root,  measuring 39 mm. There is mild dilatation of the ascending aorta,  measuring 41 mm.  7. The inferior vena cava is normal in size with greater than 50%  respiratory variability, suggesting right atrial pressure of 3 mmHg.  Lab Results      Component                Value               Date                      WBC                      6.0                 04/08/2020  HGB                      15.9                04/08/2020                HCT                      48.1                04/08/2020                MCV                      91.8                04/08/2020                PLT                      171                 04/08/2020           Lab Results      Component                Value               Date                      NA                       139                 04/08/2020                K                        4.5                 04/08/2020                CO2                      26                  04/08/2020                GLUCOSE                  189 (H)             04/08/2020                BUN                      21                  04/08/2020                CREATININE               1.10                04/08/2020                CALCIUM  9.6                 04/08/2020                GFRNONAA                 >60                 04/08/2020                GFRAA                    60                  02/11/2020          )      Anesthesia Quick Evaluation

## 2020-04-12 NOTE — Telephone Encounter (Signed)
-----   Message from Venita Lick, NP sent at 04/12/2020 11:09 AM EDT ----- Please see if we can get him back in this week for urine recheck and possible imaging order, as still having UTI symptoms and discomfort.

## 2020-04-12 NOTE — Telephone Encounter (Signed)
Spoke to Dr. Quentin Ore this morning via secure chat and staff message, was made aware of patient's ongoing symptoms.  I have ordered a CT abdomen and pelvis without contrast.  Janett Billow can we work on obtaining this as soon as possible -- I placed it for [employer], but wherever he can get in soonest, that way we can assess if stone present and assist to get him moving forward with his ablation.

## 2020-04-15 ENCOUNTER — Telehealth: Payer: Self-pay | Admitting: Nurse Practitioner

## 2020-04-15 NOTE — Telephone Encounter (Signed)
Copied from [place] [phone]. Topic: Medicare AWV >> Apr 15, 2020 12:57 PM Cher Nakai R wrote: Reason for CRM:    No answer- AWVS scheduled for April 22, 2020 has been rescheduled to do by phone on April 30, 2020 at  8:15am

## 2020-04-16 ENCOUNTER — Encounter: Payer: Self-pay | Admitting: Nurse Practitioner

## 2020-04-16 ENCOUNTER — Ambulatory Visit (INDEPENDENT_AMBULATORY_CARE_PROVIDER_SITE_OTHER): Payer: Medicare Other | Admitting: Nurse Practitioner

## 2020-04-16 ENCOUNTER — Other Ambulatory Visit: Payer: Self-pay

## 2020-04-16 VITALS — BP 113/73 | HR 87 | Temp 97.6°F | Wt 279.0 lb

## 2020-04-16 DIAGNOSIS — N3000 Acute cystitis without hematuria: Secondary | ICD-10-CM

## 2020-04-16 DIAGNOSIS — G4733 Obstructive sleep apnea (adult) (pediatric): Secondary | ICD-10-CM | POA: Diagnosis not present

## 2020-04-16 LAB — MICROSCOPIC EXAMINATION
Bacteria, UA: NONE SEEN
RBC, Urine: NONE SEEN /hpf (ref 0–2)

## 2020-04-16 LAB — URINALYSIS, ROUTINE W REFLEX MICROSCOPIC
Bilirubin, UA: NEGATIVE
Glucose, UA: NEGATIVE
Ketones, UA: NEGATIVE
Nitrite, UA: NEGATIVE
Protein,UA: NEGATIVE
RBC, UA: NEGATIVE
Specific Gravity, UA: 1.01 (ref 1.005–1.030)
Urobilinogen, Ur: 0.2 mg/dL (ref 0.2–1.0)
pH, UA: 6.5 (ref 5.0–7.5)

## 2020-04-16 MED ORDER — NYSTATIN 100000 UNIT/GM EX POWD: 1.0000 "application " | Freq: Three times a day (TID) | CUTANEOUS | 1 refills | Status: DC

## 2020-04-16 NOTE — Assessment & Plan Note (Signed)
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  Discussed with them to follow-up with sleep center to work on obtaining CPAP.

## 2020-04-16 NOTE — Addendum Note (Signed)
Addended by: [name] T on: 04/16/2020 09:12 AM   Modules accepted: Orders

## 2020-04-16 NOTE — Progress Notes (Signed)
Contacted via MyChart   Thank you for the urine sample.  It looks better this check, although are some casts present.  So possibly kidney stone, there is no blood which is great.  We will see what CT shows [place] next.  Any questions? Keep being awesome!!  Thank you for allowing me to participate in your care. Kindest regards, Maddisyn Hegwood

## 2020-04-16 NOTE — Patient Instructions (Signed)
CT Scan  A CT scan is a kind of X-ray. A CT scan makes pictures of the inside of your body. In this procedure, the pictures will be taken in a large machine that has an opening (CT scanner). What happens before the procedure? Staying hydrated Follow instructions from your doctor about hydration, which may include:  Up to 2 hours before the procedure - you may continue to drink clear liquids. These include water, clear fruit juice, black coffee, and plain tea. Eating and drinking restrictions Follow instructions from your doctor about eating and drinking, which may include:  24 hours before the procedure - stop drinking caffeinated drinks. These include energy drinks, tea, soda, coffee, and hot chocolate.  8 hours before the procedure - stop eating heavy meals or foods. These include meat, fried foods, or fatty foods.  6 hours before the procedure - stop eating light meals or foods. These include toast or cereal.  6 hours before the procedure - stop drinking milk or drinks that have milk in them.  2 hours before the procedure - stop drinking clear liquids. General instructions  Take off any jewelry.  Ask your doctor about changing or stopping your normal medicines. This is important if you take diabetes medicines or blood thinners. What happens during the procedure?  You will lie on a table with your arms above your head.  An IV tube may be put into one of your veins.  Dye may be put into the IV tube. You may feel warm or have a metal taste in your mouth.  The table you will be lying on will move into the CT scanner.  You will be able to see, hear, and talk to the person who is running the machine while you are in it. Follow that person's directions.  The machine will move around you to take pictures. Do not move.  When the machine is done taking pictures, it will be turned off.  The table will be moved out of the machine.  Your IV tube will be taken out. The procedure may  vary among doctors and hospitals. What happens after the procedure?  It is up to you to get your results. Ask when your results will be ready. Summary  A CT scan is a kind of X-ray.  A CT scan makes pictures of the inside of your body.  Follow instructions from your doctor about eating and drinking before the procedure.  You will be able to see, hear, and talk to the person who is running the machine while you are in it. Follow that person's directions. This information is not intended to replace advice given to you by your health care provider. Make sure you discuss any questions you have with your health care provider. Document Revised: 01/23/2016 Document Reviewed: 01/27/2016 Elsevier Patient Education  [place].

## 2020-04-16 NOTE — Assessment & Plan Note (Addendum)
Acute and treated with Augmentin, continues to have right CVA discomfort.  Will recheck UA today.  Scheduled for CT on 04/28/20 to assess for kidney stones.  Will work on acute issue to assist in getting cleared for ablation.  Continue daily cranberry tablets + increase fluid intake.  If any increased pain presents with N&V then recommend they immediately go to ER.   Return to office in April after CT scan.

## 2020-04-16 NOTE — Progress Notes (Signed)
BP 113/73   Pulse 87   Temp 97.6 F (36.4 C) (Oral)   Wt 279 lb (126.6 kg)   SpO2 98%   BMI 38.91 kg/m    Subjective:    Patient ID: Nicholas [name], male    DOB: [birth year], 75 y.o.   MRN: [id number]  HPI: Nicholas [name] is a 75 y.o. male  Chief Complaint  Patient presents with  . Possible Kidney Stones    Patient is here for a follow up as Cardiologist in [place] told patient he should have his PCP take a look as he may have kidney stones and he can not have his procedure until this issue is cleared up.   URINARY TRACT INFECTION Follow-up for recent UTI treatment, as was scheduled for ablation on Monday and this was cancelled due to patient having ongoing symptoms.  Recent urine on 04/07/20 was positive for Enterococcus and he was treated with Augmentin.  Previous UTI treated 02/11/20.  Started with back pain to right side about 3 weeks ago, when he moves this pain radiates around to the front of the abdomen -- feels as if something is pushing and painful.  Has stopped him from playing golf.  Does follow Dr. Andree Elk for chronic pain -- he talked to him about this Monday.  He reports having a regular bowel regimen with no constipation.  Concern for kidney stone, although recent urine with no blood noted.  Have ordered imaging and he is scheduled for CT abdomen on 04/28/2020.    His ablation will be rescheduled once all symptoms have cleared and testing performed.  They requested to have recent sleep study results discussed with them, had a sleep study at home via sleep center in [place]. Dysuria: no Urinary frequency: no Urgency: no Small volume voids: no Symptom severity: yes Urinary incontinence: no Foul odor: no Hematuria: no Abdominal pain: yes Back pain: yes Suprapubic pain/pressure: no Flank pain: yes Fever:  no Vomiting: no Status: stable Previous urinary tract infection: yes Recurrent urinary tract infection: no Sexual activity: monogomous History of sexually  transmitted disease: no Treatments attempted: none   Relevant past medical, surgical, family and social history reviewed and updated as indicated. Interim medical history since our last visit reviewed. Allergies and medications reviewed and updated.  Review of Systems  Constitutional: Negative for activity change, appetite change, fatigue and fever.  Respiratory: Negative for cough, chest tightness, shortness of breath and wheezing.   Cardiovascular: Negative for chest pain, palpitations and leg swelling.  Gastrointestinal: Positive for abdominal pain. Negative for abdominal distention, blood in stool, constipation, diarrhea, nausea and vomiting.  Genitourinary: Negative for dysuria, frequency, hematuria, testicular pain and urgency.  Musculoskeletal: Positive for back pain.  Neurological: Negative.   Psychiatric/Behavioral: Negative.     Per HPI unless specifically indicated above     Objective:    BP 113/73   Pulse 87   Temp 97.6 F (36.4 C) (Oral)   Wt 279 lb (126.6 kg)   SpO2 98%   BMI 38.91 kg/m   Wt Readings from Last 3 Encounters:  04/16/20 279 lb (126.6 kg)  04/12/20 272 lb (123.4 kg)  04/07/20 274 lb 12.8 oz (124.6 kg)    Physical Exam Vitals and nursing note reviewed.  Constitutional:      General: He is awake. He is not in acute distress.    Appearance: He is well-developed and well-groomed. He is obese.  HENT:     Head: Normocephalic and atraumatic.  Right Ear: Hearing normal. No drainage.     Left Ear: Hearing normal. No drainage.  Eyes:     General: Lids are normal.        Right eye: No discharge.        Left eye: No discharge.     Conjunctiva/sclera: Conjunctivae normal.     Pupils: Pupils are equal, round, and reactive to light.  Neck:     Thyroid: No thyromegaly.     Vascular: No carotid bruit.  Cardiovascular:     Rate and Rhythm: Normal rate and regular rhythm.     Heart sounds: Normal heart sounds, S1 normal and S2 normal. No murmur  heard. No gallop.   Pulmonary:     Effort: Pulmonary effort is normal. No accessory muscle usage or respiratory distress.     Breath sounds: Normal breath sounds.  Abdominal:     General: Bowel sounds are normal. There is no distension.     Palpations: Abdomen is soft. There is no hepatomegaly.     Tenderness: There is no abdominal tenderness. There is right CVA tenderness. There is no left CVA tenderness or guarding. Negative signs include Murphy's sign.     Hernia: No hernia is present.  Musculoskeletal:        General: Normal range of motion.     Cervical back: Normal range of motion and neck supple.     Right lower leg: No edema.     Left lower leg: No edema.  Skin:    General: Skin is warm and dry.     Findings: No rash.  Neurological:     Mental Status: He is alert and oriented to person, place, and time.  Psychiatric:        Attention and Perception: Attention normal.        Mood and Affect: Mood normal.        Behavior: Behavior normal. Behavior is cooperative.        Thought Content: Thought content normal.    Results for orders placed or performed during the hospital encounter of 04/08/20  CBC w/Diff  Result Value Ref Range   WBC 6.0 4.0 - 10.5 K/uL   RBC 5.24 4.22 - 5.81 MIL/uL   Hemoglobin 15.9 13.0 - 17.0 g/dL   HCT 48.1 39.0 - 52.0 %   MCV 91.8 80.0 - 100.0 fL   MCH 30.3 26.0 - 34.0 pg   MCHC 33.1 30.0 - 36.0 g/dL   RDW 12.9 11.5 - 15.5 %   Platelets 171 150 - 400 K/uL   nRBC 0.0 0.0 - 0.2 %   Neutrophils Relative % 62 %   Neutro Abs 3.8 1.7 - 7.7 K/uL   Lymphocytes Relative 23 %   Lymphs Abs 1.4 0.7 - 4.0 K/uL   Monocytes Relative 8 %   Monocytes Absolute 0.5 0.1 - 1.0 K/uL   Eosinophils Relative 5 %   Eosinophils Absolute 0.3 0.0 - 0.5 K/uL   Basophils Relative 1 %   Basophils Absolute 0.1 0.0 - 0.1 K/uL   Immature Granulocytes 1 %   Abs Immature Granulocytes 0.03 0.00 - 0.07 K/uL  Basic metabolic panel  Result Value Ref Range   Sodium 139 135 -  145 mmol/L   Potassium 4.5 3.5 - 5.1 mmol/L   Chloride 104 98 - 111 mmol/L   CO2 26 22 - 32 mmol/L   Glucose, Bld 189 (H) 70 - 99 mg/dL   BUN 21 8 - 23 mg/dL  Creatinine, Ser 1.10 0.61 - 1.24 mg/dL   Calcium 9.6 8.9 - 10.3 mg/dL   GFR, Estimated >60 >60 mL/min   Anion gap 9 5 - 15      Assessment & Plan:   Problem List Items Addressed This Visit      Respiratory   OSA (obstructive sleep apnea)    Reviewed recent sleep study results with them per their request, he was noted to have 205 OSA events.  Discussed with them to follow-up with sleep center to work on obtaining CPAP.          Genitourinary   Urinary tract infection without hematuria - Primary    Acute and treated with Augmentin, continues to have right CVA discomfort.  Will recheck UA today.  Scheduled for CT on 04/28/20 to assess for kidney stones.  Will work on acute issue to assist in getting cleared for ablation.  Continue daily cranberry tablets + increase fluid intake.  If any increased pain presents with N&V then recommend they immediately go to ER.   Return to office in April after CT scan.      Relevant Medications   nystatin (MYCOSTATIN/NYSTOP) powder   Other Relevant Orders   Urinalysis, Routine w reflex microscopic       Follow up plan: Return in about 13 days (around 04/29/2020) for CT scan and urine follow-up -- clearance for ablation.

## 2020-04-20 NOTE — Procedures (Signed)
  Patient Name: Nicholas [name], Nicholas [name] Date: 04/04/2020 Gender: Male D.O.B: January 20, 1946 Age (years): 74 Referring Provider: Virl Axe Height (inches): 42 Interpreting Physician: Fransico Him MD, ABSM Weight (lbs): 268 RPSGT: Jacolyn Reedy BMI: 36 MRN: [id number] Neck Size: 17.50  CLINICAL INFORMATION Sleep Study Type: HST  Indication for sleep study: N/A  Epworth Sleepiness Score: 11  SLEEP STUDY TECHNIQUE A multi-channel overnight portable sleep study was performed. The channels recorded were: nasal airflow, thoracic respiratory movement, and oxygen saturation with a pulse oximetry. Snoring was also monitored.  MEDICATIONS Patient self administered medications include: N/A.  SLEEP ARCHITECTURE Patient was studied for 612.3 minutes. The sleep efficiency was 100.0 % and the patient was supine for 0%. The arousal index was 0.0 per hour.  RESPIRATORY PARAMETERS The overall AHI was 45.7 per hour, with a central apnea index of 0 per hour.  The oxygen nadir was 79% during sleep.  CARDIAC DATA Mean heart rate during sleep was 62.2 bpm.  IMPRESSIONS - Severe obstructive sleep apnea occurred during this study (AHI = 45.7/h). - Severe oxygen desaturation was noted during this study (Min O2 = 79%). - Patient snored 0.4% during the sleep.  DIAGNOSIS - Obstructive Sleep Apnea (G47.33) - Nocturnal Hypoxemia (G47.36)  RECOMMENDATIONS - Recommend in lab CPAP titration due to severity of OSA and nocturnal hypoxemia.  - Avoid alcohol, sedatives and other CNS depressants that may worsen sleep apnea and disrupt normal sleep architecture. - Sleep hygiene should be reviewed to assess factors that may improve sleep quality. - Weight management and regular exercise should be initiated or continued.  [Electronically signed] 04/08/2020 02:52 PM  Fransico Him MD, ABSM Diplomate, American Board of Sleep Medicine

## 2020-04-22 ENCOUNTER — Ambulatory Visit: Payer: Medicare Other

## 2020-04-23 ENCOUNTER — Telehealth: Payer: Self-pay | Admitting: *Deleted

## 2020-04-23 DIAGNOSIS — G4733 Obstructive sleep apnea (adult) (pediatric): Secondary | ICD-10-CM

## 2020-04-23 NOTE — Telephone Encounter (Signed)
Spoke with pt, he sent manual transmission.  At time of call, pt not in AF but he was earlier in the day.  Current AF burden 42.6%.   Overall pt reports he is feeling some fatigue but denies any other symptoms.

## 2020-04-23 NOTE — Telephone Encounter (Signed)
Informed patient of sleep study results and patient understanding was verbalized. Patient understands his sleep study showed recommend CPAP titration. Pt is aware and agreeable to his results.  Titration sent to sleep pool

## 2020-04-23 NOTE — Telephone Encounter (Signed)
Patient was scheduled for EP study 3/21 with Dr. Quentin Ore.  That was cancelled due to Urinary symptoms/ suspected UTI, possible Kidney stones.  Pt was referred back to PCP for management.  It appears that Pt is scheduled for upcoming studies regarding the UA issues.    Was there something else you wanted me to check with the patient on?

## 2020-04-23 NOTE — Telephone Encounter (Signed)
-----   Message from Sueanne Margarita, MD sent at 04/20/2020  5:41 PM EDT ----- Please let patient know that they have sleep apnea and recommend CPAP titration. Please set up titration in the sleep lab.

## 2020-04-26 ENCOUNTER — Telehealth: Payer: Self-pay | Admitting: *Deleted

## 2020-04-26 NOTE — Telephone Encounter (Signed)
Staff message sent to Nicholas [name] ok to schedule CPAP titration.[facility] web portal no PA is required.decision BE:[id number].

## 2020-04-28 ENCOUNTER — Other Ambulatory Visit: Payer: Self-pay

## 2020-04-28 ENCOUNTER — Ambulatory Visit
Admission: RE | Admit: 2020-04-28 | Discharge: 2020-04-28 | Disposition: A | Discharge status: Home / Self Care | Payer: Medicare Other | Source: Ambulatory Visit | Attending: Nurse Practitioner | Admitting: Nurse Practitioner

## 2020-04-28 DIAGNOSIS — R1084 Generalized abdominal pain: Secondary | ICD-10-CM | POA: Diagnosis not present

## 2020-04-29 ENCOUNTER — Ambulatory Visit (INDEPENDENT_AMBULATORY_CARE_PROVIDER_SITE_OTHER): Payer: Medicare Other | Admitting: Nurse Practitioner

## 2020-04-29 ENCOUNTER — Encounter: Payer: Self-pay | Admitting: Nurse Practitioner

## 2020-04-29 VITALS — BP 118/72 | HR 84 | Temp 98.3°F | Wt 280.4 lb

## 2020-04-29 DIAGNOSIS — N3000 Acute cystitis without hematuria: Secondary | ICD-10-CM | POA: Diagnosis not present

## 2020-04-29 LAB — URINALYSIS, ROUTINE W REFLEX MICROSCOPIC
Bilirubin, UA: NEGATIVE
Glucose, UA: NEGATIVE
Ketones, UA: NEGATIVE
Leukocytes,UA: NEGATIVE
Nitrite, UA: NEGATIVE
Protein,UA: NEGATIVE
RBC, UA: NEGATIVE
Specific Gravity, UA: 1.015 (ref 1.005–1.030)
Urobilinogen, Ur: 0.2 mg/dL (ref 0.2–1.0)
pH, UA: 6.5 (ref 5.0–7.5)

## 2020-04-29 NOTE — Progress Notes (Signed)
Contacted via MyChart   Urine is now clear, definitely suspect some ongoing muscle pain and would try what we discussed today.:)

## 2020-04-29 NOTE — Assessment & Plan Note (Signed)
Acute and treated with Augmentin, improving symptoms and negative abdominal CT yesterday.  Will recheck UA today.  Sent staff message to Dr. Quentin Ore to alert him on CT results, feel is is okay to proceed with ablation.  Suspect current mild discomfort is musculoskeletal in nature, recommended he take his Robaxin as needed + use TENS to area and follow-up with Dr. Andree Elk.  Continue daily cranberry tablets + increase fluid intake.  If any increased pain presents with N&V then recommend they immediately go to ER.   Return to office as schedule April 20th.

## 2020-04-29 NOTE — Progress Notes (Signed)
BP 118/72 (BP Location: Right Arm, Cuff Size: Large)   Pulse 84   Temp 98.3 F (36.8 C) (Oral)   Wt 280 lb 6.4 oz (127.2 kg)   SpO2 93%   BMI 39.11 kg/m    Subjective:    Patient ID: Nicholas [name], male    DOB: 10-Jan-1946, 75 y.o.   MRN: [id number]  HPI: Nicholas [name] is a 75 y.o. male  Chief Complaint  Patient presents with  . Imaging Result Follow Up    Patient states he is here to discuss results from his CT scan yesterday. Patient states he is still hurting, but not as bad as he was about 2 weeks ago.    URINARY TRACT INFECTION Follow-up for recent UTI treatment.  Was scheduled for ablation on 04/12/20 and this was cancelled due to patient having ongoing symptoms.  Recent urine on 04/07/20 was positive for Enterococcus and he was treated with Augmentin -- repeat urine 04/16/20 did show improvement.  Previous UTI treated 02/11/20.  There was concern for kidney stone, although recent urine with no blood noted.  Imaging performed, CT abdomen, yesterday this showed all normal findings and no kidney stones present.  He reports today pain is improving -- now pain 3/10, notices it more when walking.    Started with back pain to right side about 5 weeks ago, when he moves this pain radiates around to the front of the abdomen -- feels as if something is pushing and painful.  Has taken break from golfing.  Does follow Dr. Andree Elk for chronic pain, he is aware of discomfort.  He reports having a regular bowel regimen with no constipation.    His ablation will be rescheduled once all symptoms have cleared and testing returned.   Dysuria: no Urinary frequency: occasional Urgency: no Small volume voids: no Symptom severity: yes Urinary incontinence: no Foul odor: no Hematuria: no Abdominal pain: a little bit -- but improving Back pain: a little bit -- but improving Suprapubic pain/pressure: no Flank pain: yes Fever:  no Vomiting: no Status: stable Previous urinary tract infection:  yes Recurrent urinary tract infection: no Sexual activity: monogomous History of sexually transmitted disease: no Treatments attempted: none   Relevant past medical, surgical, family and social history reviewed and updated as indicated. Interim medical history since our last visit reviewed. Allergies and medications reviewed and updated.  Review of Systems  Constitutional: Negative for activity change, appetite change, fatigue and fever.  Respiratory: Negative for cough, chest tightness, shortness of breath and wheezing.   Cardiovascular: Negative for chest pain, palpitations and leg swelling.  Gastrointestinal: Negative for abdominal distention, abdominal pain, blood in stool, constipation, diarrhea, nausea and vomiting.  Genitourinary: Negative for dysuria, frequency, hematuria, testicular pain and urgency.  Musculoskeletal: Positive for back pain.  Neurological: Negative.   Psychiatric/Behavioral: Negative.     Per HPI unless specifically indicated above     Objective:    BP 118/72 (BP Location: Right Arm, Cuff Size: Large)   Pulse 84   Temp 98.3 F (36.8 C) (Oral)   Wt 280 lb 6.4 oz (127.2 kg)   SpO2 93%   BMI 39.11 kg/m   Wt Readings from Last 3 Encounters:  04/29/20 280 lb 6.4 oz (127.2 kg)  04/16/20 279 lb (126.6 kg)  04/12/20 272 lb (123.4 kg)    Physical Exam Vitals and nursing note reviewed.  Constitutional:      General: He is awake. He is not in acute distress.  Appearance: He is well-developed and well-groomed. He is obese.  HENT:     Head: Normocephalic and atraumatic.     Right Ear: Hearing normal. No drainage.     Left Ear: Hearing normal. No drainage.  Eyes:     General: Lids are normal.        Right eye: No discharge.        Left eye: No discharge.     Conjunctiva/sclera: Conjunctivae normal.     Pupils: Pupils are equal, round, and reactive to light.  Neck:     Thyroid: No thyromegaly.     Vascular: No carotid bruit.  Cardiovascular:      Rate and Rhythm: Normal rate and regular rhythm.     Heart sounds: Normal heart sounds, S1 normal and S2 normal. No murmur heard. No gallop.   Pulmonary:     Effort: Pulmonary effort is normal. No accessory muscle usage or respiratory distress.     Breath sounds: Normal breath sounds.  Abdominal:     General: Bowel sounds are normal. There is no distension.     Palpations: Abdomen is soft. There is no hepatomegaly.     Tenderness: There is no abdominal tenderness. There is no right CVA tenderness, left CVA tenderness or guarding. Negative signs include Murphy's sign.     Hernia: No hernia is present.  Musculoskeletal:        General: Normal range of motion.       Arms:     Cervical back: Normal range of motion and neck supple.     Right lower leg: No edema.     Left lower leg: No edema.  Skin:    General: Skin is warm and dry.     Findings: No rash.  Neurological:     Mental Status: He is alert and oriented to person, place, and time.  Psychiatric:        Attention and Perception: Attention normal.        Mood and Affect: Mood normal.        Behavior: Behavior normal. Behavior is cooperative.        Thought Content: Thought content normal.    Results for orders placed or performed in visit on 04/16/20  Microscopic Examination   Urine  Result Value Ref Range   WBC, UA 0-5 0 - 5 /hpf   RBC None seen 0 - 2 /hpf   Epithelial Cells (non renal) 0-10 0 - 10 /hpf   Casts Present (A) None seen /lpf   Cast Type Hyaline casts N/A   Bacteria, UA None seen None seen/Few  Urinalysis, Routine w reflex microscopic  Result Value Ref Range   Specific Gravity, UA 1.010 1.005 - 1.030   pH, UA 6.5 5.0 - 7.5   Color, UA Yellow Yellow   Appearance Ur Clear Clear   Leukocytes,UA Trace (A) Negative   Protein,UA Negative Negative/Trace   Glucose, UA Negative Negative   Ketones, UA Negative Negative   RBC, UA Negative Negative   Bilirubin, UA Negative Negative   Urobilinogen, Ur 0.2 0.2 -  1.0 mg/dL   Nitrite, UA Negative Negative   Microscopic Examination See below:       Assessment & Plan:   Problem List Items Addressed This Visit      Genitourinary   Urinary tract infection without hematuria - Primary    Acute and treated with Augmentin, improving symptoms and negative abdominal CT yesterday.  Will recheck UA today.  Sent staff message  to Dr. Quentin Ore to alert him on CT results, feel is is okay to proceed with ablation.  Suspect current mild discomfort is musculoskeletal in nature, recommended he take his Robaxin as needed + use TENS to area and follow-up with Dr. Andree Elk.  Continue daily cranberry tablets + increase fluid intake.  If any increased pain presents with N&V then recommend they immediately go to ER.   Return to office as schedule April 20th.      Relevant Orders   Urinalysis, Routine w reflex microscopic       Follow up plan: Return for as scheduled in upcoming months.

## 2020-04-29 NOTE — Patient Instructions (Signed)
Urinary Tract Infection, Adult A urinary tract infection (UTI) is an infection of any part of the urinary tract. The urinary tract includes:  The kidneys.  The ureters.  The bladder.  The urethra. These organs make, store, and get rid of pee (urine) in the body. What are the causes? This infection is caused by germs (bacteria) in your genital area. These germs grow and cause swelling (inflammation) of your urinary tract. What increases the risk? The following factors may make you more likely to develop this condition:  Using a small, thin tube (catheter) to drain pee.  Not being able to control when you pee or poop (incontinence).  Being male. If you are male, these things can increase the risk: ? Using these methods to prevent pregnancy:  A medicine that kills sperm (spermicide).  A device that blocks sperm (diaphragm). ? Having low levels of a male hormone (estrogen). ? Being pregnant. You are more likely to develop this condition if:  You have genes that add to your risk.  You are sexually active.  You take antibiotic medicines.  You have trouble peeing because of: ? A prostate that is bigger than normal, if you are male. ? A blockage in the part of your body that drains pee from the bladder. ? A kidney stone. ? A nerve condition that affects your bladder. ? Not getting enough to drink. ? Not peeing often enough.  You have other conditions, such as: ? Diabetes. ? A weak disease-fighting system (immune system). ? Sickle cell disease. ? Gout. ? Injury of the spine. What are the signs or symptoms? Symptoms of this condition include:  Needing to pee right away.  Peeing small amounts often.  Pain or burning when peeing.  Blood in the pee.  Pee that smells bad or not like normal.  Trouble peeing.  Pee that is cloudy.  Fluid coming from the vagina, if you are male.  Pain in the belly or lower back. Other symptoms include:  Vomiting.  Not  feeling hungry.  Feeling mixed up (confused). This may be the first symptom in older adults.  Being tired and grouchy (irritable).  A fever.  Watery poop (diarrhea). How is this treated?  Taking antibiotic medicine.  Taking other medicines.  Drinking enough water. In some cases, you may need to see a specialist. Follow these instructions at home: Medicines  Take over-the-counter and prescription medicines only as told by your doctor.  If you were prescribed an antibiotic medicine, take it as told by your doctor. Do not stop taking it even if you start to feel better. General instructions  Make sure you: ? Pee until your bladder is empty. ? Do not hold pee for a long time. ? Empty your bladder after sex. ? Wipe from front to back after peeing or pooping if you are a male. Use each tissue one time when you wipe.  Drink enough fluid to keep your pee pale yellow.  Keep all follow-up visits.   Contact a doctor if:  You do not get better after 1-2 days.  Your symptoms go away and then come back. Get help right away if:  You have very bad back pain.  You have very bad pain in your lower belly.  You have a fever.  You have chills.  You feeling like you will vomit or you vomit. Summary  A urinary tract infection (UTI) is an infection of any part of the urinary tract.  This condition is caused by   germs in your genital area.  There are many risk factors for a UTI.  Treatment includes antibiotic medicines.  Drink enough fluid to keep your pee pale yellow. This information is not intended to replace advice given to you by your health care provider. Make sure you discuss any questions you have with your health care provider. Document Revised: 08/22/2019 Document Reviewed: 08/22/2019 Elsevier Patient Education  2021 [employer] Inc.  

## 2020-04-30 ENCOUNTER — Ambulatory Visit (INDEPENDENT_AMBULATORY_CARE_PROVIDER_SITE_OTHER): Payer: Medicare Other

## 2020-04-30 VITALS — Ht 71.0 in | Wt 280.0 lb

## 2020-04-30 DIAGNOSIS — Z0000 Encounter for general adult medical examination without abnormal findings: Secondary | ICD-10-CM | POA: Diagnosis not present

## 2020-04-30 NOTE — Progress Notes (Signed)
I connected with Melford Aase today by telephone and verified that I am speaking with the correct person using two identifiers. Location patient: home Location provider: work Persons participating in the virtual visit: Nicholas [name], Nicholas [name] (spouse), Glenna Durand LPN.   I discussed the limitations, risks, security and privacy concerns of performing an evaluation and management service by telephone and the availability of in person appointments. I also discussed with the patient that there may be a patient responsible charge related to this service. The patient expressed understanding and verbally consented to this telephonic visit.    Interactive audio and video telecommunications were attempted between this provider and patient, however failed, due to patient having technical difficulties OR patient did not have access to video capability.  We continued and completed visit with audio only.     Vital signs may be patient reported or missing.  Subjective:   Nicholas [name] is a 75 y.o. male who presents for Medicare Annual/Subsequent preventive examination.  Review of Systems     Cardiac Risk Factors include: advanced age (>29men, >38 women);obesity (BMI >30kg/m2);sedentary lifestyle     Objective:    Today's Vitals   04/30/20 0811 04/30/20 0812  Weight: 280 lb (127 kg)   Height: 5\' 11"  (1.803 m)   PainSc:  3    Body mass index is 39.05 kg/m.  Advanced Directives 04/30/2020 04/12/2020 11/22/2019 04/21/2019 04/18/2018 02/25/2018 01/05/2018  Does Patient Have a Medical Advance Directive? No Yes No No No No No  Type of Advance Directive - Living will - - - - -  Does patient want to make changes to medical advance directive? - No - Patient declined - - - - -  Would patient like information on creating a medical advance directive? - - - - (No Data) - -  Pre-existing out of facility DNR order (yellow form or pink MOST form) - - - - - - -    Current Medications (verified) Outpatient  Encounter Medications as of 04/30/2020  Medication Sig  . acetaminophen (TYLENOL) 650 MG CR tablet Take 650 mg by mouth every 8 (eight) hours as needed for pain.  Marland Kitchen apixaban (ELIQUIS) 5 MG TABS tablet Take 5 mg by mouth 2 (two) times daily.  . Ascorbic Acid (VITAMIN C) 1000 MG tablet Take 1,000 mg by mouth daily.  . Cranberry 500 MG CAPS Take 500 mg by mouth daily.  . diphenhydramine-acetaminophen (TYLENOL PM) 25-500 MG TABS tablet Take 1 tablet by mouth at bedtime as needed (sleep).  . Ferrous Gluconate (IRON 27 PO) Take 27 mg by mouth daily.  . fludrocortisone (FLORINEF) 0.1 MG tablet Take 0.1 mg by mouth daily.  . fluticasone (FLONASE) 50 MCG/ACT nasal spray fluticasone propionate 50 mcg/actuation nasal spray,suspension  . gabapentin (NEURONTIN) 600 MG tablet Take 1 tablet (600 mg total) by mouth at bedtime.  Marland Kitchen HYDROcodone-acetaminophen (NORCO) 7.5-325 MG tablet Take 1 tablet by mouth every 6 (six) hours as needed for moderate pain or severe pain.  Marland Kitchen HYDROcodone-acetaminophen (NORCO) 7.5-325 MG tablet Take 1 tablet by mouth 2 (two) times daily as needed for moderate pain or severe pain.  Marland Kitchen HYDROcodone-acetaminophen (NORCO/VICODIN) 5-325 MG tablet Take 1 tablet by mouth in the morning and at bedtime.  Derrill Memo ON 05/05/2020] HYDROcodone-acetaminophen (NORCO/VICODIN) 5-325 MG tablet Take 1 tablet by mouth 2 (two) times daily as needed for moderate pain or severe pain.  . hyoscyamine (LEVSIN, ANASPAZ) 0.125 MG tablet Take 0.125 mg by mouth every 6 (six) hours as needed for  bladder spasms or cramping.   Marland Kitchen LORazepam (ATIVAN) 1 MG tablet Take 1 tablet (1 mg total) by mouth at bedtime.  . Magnesium 400 MG TABS Take 400 mg by mouth daily.   . Melatonin 10 MG TABS Take 10 mg by mouth at bedtime.   . methocarbamol (ROBAXIN) 500 MG tablet Take 1 tablet (500 mg total) by mouth 2 (two) times daily as needed for muscle spasms.  . midodrine (PROAMATINE) 10 MG tablet Take 1 tablet (10 mg total) by mouth 3  (three) times daily.  . midodrine (PROAMATINE) 5 MG tablet Take 10 mg by mouth in the morning, at noon, and at bedtime.  . Multiple Vitamin (MULTIVITAMIN WITH MINERALS) TABS tablet Take 1 tablet by mouth daily. Centrum Silver  . nystatin (MYCOSTATIN/NYSTOP) powder Apply 1 application topically 3 (three) times daily.  . Omega-3 Fatty Acids (FISH OIL) 1200 MG CAPS Take 1,200 mg by mouth daily.  Marland Kitchen oxymetazoline (AFRIN) 0.05 % nasal spray Place 2 sprays into both nostrils at bedtime.  . pantoprazole (PROTONIX) 20 MG tablet Take 1 tablet (20 mg total) by mouth daily.  . potassium chloride SA (KLOR-CON) 20 MEQ tablet Take 2 tablets (40 meq) x 1 dose as directed (Patient taking differently: Take 40 mEq by mouth daily as needed (with torsemide (fluid retention)).)  . QUEtiapine Fumarate (SEROQUEL XR) 150 MG 24 hr tablet TAKE 1 TABLET (150 MG TOTAL) BY MOUTH AT BEDTIME.  Marland Kitchen rOPINIRole (REQUIP) 0.25 MG tablet Take 0.25 mg by mouth 3 (three) times daily.  . rosuvastatin (CRESTOR) 40 MG tablet Take 1 tablet (40 mg total) by mouth daily.  . solifenacin (VESICARE) 10 MG tablet Take 1 tablet (10 mg total) by mouth daily.  Marland Kitchen torsemide (DEMADEX) 20 MG tablet Take 40 mg by mouth daily as needed (fluid retention).  . triamcinolone cream (KENALOG) 0.1 % Apply 1 application topically 2 (two) times daily as needed (skin irritation).  . venlafaxine XR (EFFEXOR XR) 75 MG 24 hr capsule Take 3 capsules (225 mg total) by mouth daily.   No facility-administered encounter medications on file as of 04/30/2020.    Allergies (verified) Patient has no known allergies.   History: Past Medical History:  Diagnosis Date  . Anterior urethral stricture   . Anxiety   . Arthritis    a. knees, hips, hands;  b. 11/2013 s/p L TKA @ [place].  . Bile reflux gastritis   . Bulging lumbar disc   . BXO (balanitis xerotica obliterans)   . 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  Depression   . DVT (deep venous thrombosis) ([place])   . Erosive esophagitis   . Gross hematuria   . Hyperlipemia   . Hypertension    borderline  . Internal hemorrhoids   . Phimosis   . Pulmonary embolism [facility])    Past Surgical History:  Procedure Laterality Date  . BUNIONECTOMY Bilateral 01/06/2015   Procedure: BUNIONECTOMY;  Surgeon: [name], MD;  Location: [facility] ORS;  Service: Orthopedics;  Laterality: Bilateral;  . CARDIAC CATHETERIZATION  ~ 2005   "once"  . CATARACT EXTRACTION W/ INTRAOCULAR LENS  IMPLANT, BILATERAL Bilateral ~ 2010  . COLONOSCOPY WITH PROPOFOL N/A 12/07/2015   Procedure: COLONOSCOPY WITH PROPOFOL;  Surgeon: Lollie Sails, MD;  Location: [facility] ENDOSCOPY;  Service: Endoscopy;  Laterality: N/A;  . EP IMPLANTABLE DEVICE N/A 02/16/2016   MDT dual chamber (His Bundle) pacemaker implanted by Dr Caryl Comes  for intermittent complete heart block  . ESOPHAGOGASTRODUODENOSCOPY (EGD) WITH PROPOFOL N/A 12/07/2015   Procedure: ESOPHAGOGASTRODUODENOSCOPY (EGD) WITH PROPOFOL;  Surgeon: Lollie Sails, MD;  Location[facility] ENDOSCOPY;  Service: Endoscopy;  Laterality: N/A;  . ESOPHAGOGASTRODUODENOSCOPY (EGD) WITH PROPOFOL N/A 05/17/2017   Procedure: ESOPHAGOGASTRODUODENOSCOPY (EGD) WITH PROPOFOL;  Surgeon: Lollie Sails, MD;  Location: [facility] ENDOSCOPY;  Service: Endoscopy;  Laterality: N/A;  . HAMMER TOE SURGERY Bilateral 01/06/2015   Procedure: HAMMER TOE CORRECTION;  Surgeon: [name], MD;  Location: [facility] ORS;  Service: Orthopedics;  Laterality: Bilateral;  . KNEE CARTILAGE SURGERY Left 1965   "football injury"  . Left Total Knee Arthroplasty     a. 11/2013 [facility].  Marland Kitchen PILONIDAL CYST EXCISION  1970's  . TOTAL HIP ARTHROPLASTY Right 2004  . TOTAL HIP ARTHROPLASTY Left 2006  . UPPER GI ENDOSCOPY     Family History  Problem Relation Age of Onset  . Stroke Father        deceased  . Diabetes Father   . Hypertension Father   . Alcoholism Mother        died in her  1's.  . Cancer Mother   . Glaucoma Sister   . Breast cancer Sister   . Prostate cancer Paternal Grandfather    Social History   Socioeconomic History  . Marital status: Married    Spouse name: Not on file  . Number of children: Not on file  . Years of education: Not on file  . Highest education level: High school graduate  Occupational History  . Occupation: retired   Tobacco Use  . Smoking status: Never Smoker  . Smokeless tobacco: Never Used  Vaping Use  . Vaping Use: Never used  Substance and Sexual Activity  . Alcohol use: Yes    Alcohol/week: 0.0 standard drinks    Comment: beers seldom   . Drug use: No  . Sexual activity: Not Currently  Other Topics Concern  . Not on file  Social History Narrative   Lives in [place] with wife.  Does not routinely exercise.  Activity severely limited by bilateral knee pain.   Social Determinants of Health   Financial Resource Strain: Low Risk   . Difficulty of Paying Living Expenses: Not hard at all  Food Insecurity: No Food Insecurity  . Worried About Charity fundraiser in the Last Year: Never true  . Ran Out of Food in the Last Year: Never true  Transportation Needs: No Transportation Needs  . Lack of Transportation (Medical): No  . Lack of Transportation (Non-Medical): No  Physical Activity: Inactive  . Days of Exercise per Week: 0 days  . Minutes of Exercise per Session: 0 min  Stress: No Stress Concern Present  . Feeling of Stress : Not at all  Social Connections: Not on file    Tobacco Counseling Counseling given: Not Answered   Clinical Intake:  Pre-visit preparation completed: Yes  Pain : 0-10 Pain Score: 3  Pain Type: Acute pain Pain Location: Back Pain Orientation: Right Pain Descriptors / Indicators: Aching Pain Onset: 1 to 4 weeks ago Pain Frequency: Constant     Nutritional Status: BMI > 30  Obese Nutritional Risks: None Diabetes: No  How often do you need to have someone help you when you  read instructions, pamphlets, or other written materials from your doctor or pharmacy?: 1 - Never What is the last grade level you completed in school?: 12th grade  Diabetic? no  Interpreter Needed?: No  Information  entered by :: NAllen LPN   Activities of Daily Living In your present state of health, do you have any difficulty performing the following activities: 04/30/2020 04/12/2020  Hearing? Y N  Comment tinnitus -  Vision? N N  Difficulty concentrating or making decisions? Y N  Comment not remember conversation and names -  Walking or climbing stairs? Y N  Dressing or bathing? N N  Doing errands, shopping? N -  Preparing Food and eating ? N -  Using the Toilet? N -  In the past six months, have you accidently leaked urine? Y -  Comment had some bladder infections -  Do you have problems with loss of bowel control? N -  Managing your Medications? Y -  Comment spouse manages -  Managing your Finances? N -  Housekeeping or managing your Housekeeping? N -  Some recent data might be hidden    Patient Care Team: Venita Lick, NP as PCP - General (Nurse Practitioner) [name], MD (Specialist) Hollice Espy, MD as Consulting Physician (Urology) Tanda Rockers, MD as Consulting Physician (Pulmonary Disease) [name], MD as Consulting Physician (Anesthesiology) Deboraha Sprang, MD as Consulting Physician (Cardiology) Greg Cutter, LCSW as Social Worker (Licensed Clinical Social Worker) Vladimir Faster, [facility] (Pharmacist)  Indicate any recent Medical Services you may have received from other than [facility] providers in the past year (date may be approximate).     Assessment:   This is a routine wellness examination for [facility].  Hearing/Vision screen No exam data present  Dietary issues and exercise activities discussed: Current Exercise Habits: The patient does not participate in regular exercise at present  Goals    .  DIET - INCREASE WATER INTAKE       recommend drinking at least 6-8 glasses of water a day     .  Patient Stated      04/30/2020, get back to playing golf    .  PharmD "I need help with Eliquis copay"      CARE PLAN ENTRY (see longitudinal plan of care for additional care plan information)  Current Barriers:  . Chronic Disease Management support, education, and care coordination needs related to Hypertension, Hyperlipidemia, Atrial Fibrillation, and Coronary Artery Disease Eliquis copay recently increased causing financial strain.    Hypertension with orthostatic hypotension BP Readings from Last 3 Encounters:  07/01/19 128/78  03/26/19 127/82  03/25/19 110/72   Currently taking midodrine 5 mg bid.   Hyperlipidemia Lab Results  Component Value Date/Time   LDLCALC 86 07/01/2019 04:31 PM   Currently taking simvastatin 20 mg daily and fish oil 1200 mg qd.   AFIB with H/O PE and DVT  Flecainide 100 mg bid  Eliquis 5 mg bid    :  21Per Mrs. Overley's report Mr. Scheff is doing well on above regimen. Their copayment for Eliquis has recently gone from $40 to >$100 and she assumes this is because of the coverage gap.   Patient and spouse income exceed limits for PAP. Options include use of free 30 day voucher for Eliquis  this is only available once per lifetime). Another option is converting to Xarelto for $85/month through [employer]. Per [place], patient is not opposed to switching agents if necessary.   Will follow up with patient next week and coordinate with Cardiology for authorization to switch to Xarelto if preferable to patient.60   . Pharmacist Clinical Goal(s): o Over the next 90 days, patient will work with PharmD  and providers to maintain BP goal <130/80, achieve LDL goal< 70, increase access to Eliquis or other alternative . Interventions: . Comprehensive medication review performed, medication list updated in electronic medical record . Inter-disciplinary care team collaboration (see longitudinal  plan of care)  . Will pursue affordable option for anticoagulation . Patient self care activities - Over the next 90days, patient will: o Check BP  twice daily document, and provide at future appointments o Ensure daily salt intake < 2300 mg/day o Focus on medication adherence by utilizing pill box o Take medications as prescribed o Report any questions or concerns to PharmD and/or provider(s)  Initial goal documentation     .  PharmD "I want to stay healthy" (pt-stated)      Current Barriers:  . Polypharmacy; complex patient with multiple comorbidities including chronic pain, anxiety, Afib, hx PE, heart block . 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 Notes he has tried to apply for VA benefits "a few times", with no success.  Marland Kitchen His wife helps w/ management of regimen o Afib/ hx PE: flecanide 100 mg BID, Eliquis 5 mg BID o Chronic pain: hydrocodone/APAP 5/325 mg BID, gabapentin 400 mg QPM, diclofenac 1% cream. Follows w/ pain management Dr. Andree Elk.  o Anxiety/depression/sleep: venlafaxine 225 mg daily, quetiapine 150 mg QPM, lorazepam 1 mg QPM, melatonin 10 mg QPM. Notes that he is unable to sleep without lorazepam. No hx trazodone.  - Denies caffeine in the afternoons, is not using screens/watching TV in bed. Does note napping more during the day lately, due to being "stuck at home" and not being out playing golf as much o OAB: solifenacin 10 mg daily, hyoscyamine 0.125 mg PRN bladder spasms o ASCVD risk reduction: simvastatin 20 mg QPM, omega 3 fatty acids 1200 mg daily o Suspected tinea pedis. No improvement w/ miconazole, patient has seen dermatology and is using OTC terbinafine topical. Notes that he is supposed to call dermatology if no improvement in a week to discuss oral terbinafine therapy o Nasal congestion: Afrin QPM at bedtime. He notes no other nasal sprays have helped relieve his bedtime congestion. No daytime symptoms o GERD: pantoprazole 40 mg daily   o Supplements: Vitamin C, cranberry, vitamin B12, MVI, probiotic  Pharmacist Clinical Goal(s):  Marland Kitchen Over the next 90 days, patient will work with PharmD and provider towards optimized medication management  Interventions: . Comprehensive medication review performed; medication list updated in electronic medical record . Reviewed risks of combination benzodiazepine/opioid therapies. Discussed goal to minimize lorazepam need. Recommended he try 1/2 lorazepam tablet (0.5 mg) for sleep, instead of a full tablet. . Discussed good sleep hygiene habits. SNRI may be more activating than other antidepressant options. Could consider trying SSRI; appears to have hx fluoxetine, but no others. No hx trazodone. He would like an appointment w/ PCP to discuss options. Will collaborate w/ administrative staff to outreach to schedule on a Wednesday when I am here.  . Discussed rebound congestion w/ Afrin, however, patient has been on this medication for years.  . Discussed oral terbinafine. He notes that dermatology noted the medication could impact his liver. Encouraged him to call dermatology to follow up.  . Will collaborate w/ LCSW to outreach patient for support in pursuing veterans services  Patient Self Care Activities:  . Patient will take medications as prescribed  Initial goal documentation     .  SW: We need more help in the home for Tommy (pt-stated)  CARE PLAN ENTRY (see longtitudinal plan of care for additional care plan information)  Current Barriers:  . Financial constraints related to managing health care expenses . Limited social support . ADL IADL limitations . Social Isolation . Limited access to caregiver . Inability to perform ADL's independently . Inability to perform IADL's independently  Clinical Social Work Clinical Goal(s):  Marland Kitchen Over the next 120 days, patient will work with SW to address concerns related to gaining additional support within the home and [place]  benefits  Interventions: . Patient interviewed and appropriate assessments performed . Provided patient with information about how to apply for VA benefits . Discussed plans with patient for ongoing care management follow up and provided patient with direct contact information for care management team . Advised patient to check email for sent community resources. Spouse agreeabel to contact [employer]. . Resources discussed- .  Veteran[employer] in [employer].  . Veterans [place] in [place] is a Environmental education officer where veterans are assisted and educated on obtaining benefits in which they earn serving their country. Here you, the veteran, are our priority. [employer] prides itself on offering the personal assistant and care, so many veterans need. . Mail address: [address] [address] [facility] [address] . Physical address: [place] . Contact person: Katrina Love . Phone: [phone] . Email: [email or web address] . [place] . [place], [place] [address] . [phone] . [facility] Benefit Office . [address], [place] [address] . Monday-Thursday, 8am-4pm . Friday, 9am-4pm . [employer]  . [phone] . As a [place] Oncologist or spouse, you can earn up to $3,032 a month TAX FREE to help you pay for your Senior Living. As a wartime hero, you've earned the Aid and Attendance benefit through the U.[place] Department of [employer]. . [employer] helps you streamline your VA benefit application and cuts through the government red tape to properly serve you. Every month, thousands of U.S. wartime heroes (and widows) collect millions of dollars paying for their Senior Living expenses. Email sent on 03/31/19 . Assisted patient/caregiver with obtaining information about health plan benefits . Provided education and assistance to client regarding Advanced Directives. . Provided education to  patient/caregiver regarding level of care options. . Provided education to patient/caregiver about Hospice and/or Palliative Care services  Patient Self Care Activities:  . Attends all scheduled provider appointments . Calls provider office for new concerns or questions . Lacks social connections  Initial goal documentation       Depression Screen PHQ 2/9 Scores 04/30/2020 02/11/2020 11/10/2019 07/01/2019 04/21/2019 03/26/2019 08/14/2018  PHQ - 2 Score 0 2 2 0 0 0 0  PHQ- 9 Score - 4 6 1  - 1 7  Exception Documentation - - - - - - -    Fall Risk Fall Risk  04/30/2020 01/08/2020 11/10/2019 04/21/2019 04/18/2018  Falls in the past year? 1 1 0 0 1  Comment potassium went low, BP drop - - - -  Number falls in past yr: 1 0 1 0 0  Comment - - - - -  Injury with Fall? 0 0 0 0 1  Comment - - - - hit head, visited ED  Risk Factor Category  - - - - -  Risk for fall due to : Medication side effect;Impaired balance/gait - Impaired balance/gait - -  Follow up Falls evaluation completed;Education provided;Falls prevention discussed - Falls evaluation completed - -  Comment - - - - -    FALL RISK PREVENTION PERTAINING TO THE HOME:  Any stairs in or around the home? No  If so, are there any without handrails? n/a Home free of loose throw rugs in walkways, pet beds, electrical cords, etc? Yes  Adequate lighting in your home to reduce risk of falls? Yes   ASSISTIVE DEVICES UTILIZED TO PREVENT FALLS:  Life alert? No  Use of a cane, walker or w/c? Yes  Grab bars in the bathroom? Yes  Shower chair or bench in shower? Yes  Elevated toilet seat or a handicapped toilet? Yes   TIMED UP AND GO:  Was the test performed? No .     Cognitive Function:     6CIT Screen 04/30/2020 11/10/2019 04/18/2018 04/11/2017  What Year? 0 points 0 points 0 points 0 points  What month? 0 points 0 points 0 points 0 points  What time? 0 points 0 points 0 points 0 points  Count back from 20 0 points 0 points 0 points 0  points  Months in reverse 0 points 0 points 0 points 0 points  Repeat phrase 2 points 0 points 0 points 0 points  Total Score 2 0 0 0    Immunizations Immunization History  Administered Date(s) Administered  . Influenza Split 11/13/2013  . Influenza Whole 11/09/2015  . Influenza, High Dose Seasonal PF 11/16/2015, 11/14/2016, 09/27/2017, 09/28/2018, 10/14/2019  . Influenza,inj,Quad PF,6+ Mos 10/20/2014  . Influenza-Unspecified 09/23/2012, 03/21/2018  . Moderna Sars-Covid-2 Vaccination 03/07/2019, 04/07/2019  . Pneumococcal Conjugate-13 04/28/2014, 09/28/2018  . Pneumococcal Polysaccharide-23 04/10/2013, 10/14/2019  . Pneumococcal-Unspecified 10/01/2008  . Td 01/05/2005  . Tdap 06/11/2017    TDAP status: Up to date  Flu Vaccine status: Up to date  Pneumococcal vaccine status: Up to date  Covid-19 vaccine status: Completed vaccines  Qualifies for Shingles Vaccine? Yes   Zostavax completed No   Shingrix Completed?: No.    Education has been provided regarding the importance of this vaccine. Patient has been advised to call insurance company to determine out of pocket expense if they have not yet received this vaccine. Advised may also receive vaccine at local pharmacy or Health Dept. Verbalized acceptance and understanding.  Screening Tests Health Maintenance  Topic Date Due  . COVID-19 Vaccine (3 - Booster for Moderna series) 10/08/2019  . INFLUENZA VACCINE  08/23/2020  . COLONOSCOPY (Pts 45-71yrs Insurance coverage will need to be confirmed)  12/06/2025  . TETANUS/TDAP  06/12/2027  . Hepatitis C Screening  Completed  . PNA vac Low Risk Adult  Completed  . HPV VACCINES  Aged Out    Health Maintenance  Health Maintenance Due  Topic Date Due  . COVID-19 Vaccine (3 - Booster for Moderna series) 10/08/2019    Colorectal cancer screening: No longer required.   Lung Cancer Screening: (Low Dose CT Chest recommended if Age 52-80 years, 30 pack-year currently smoking OR  have quit w/in 15years.) does not qualify.   Lung Cancer Screening Referral: no  Additional Screening:  Hepatitis C Screening: does qualify; Completed 05/11/2015  Vision Screening: Recommended annual ophthalmology exams for early detection of glaucoma and other disorders of the eye. Is the patient up to date with their annual eye exam?  Yes  Who is the provider or what is the name of the office in which the patient attends annual eye exams? Dr. Ellin Mayhew If pt is not established with a provider, would they like to be referred to a provider to establish care? No .  Dental Screening: Recommended annual dental exams for proper oral hygiene  Community Resource Referral / Chronic Care Management: CRR required this visit?  No   CCM required this visit?  No      Plan:     I have personally reviewed and noted the following in the patient's chart:   . Medical and social history . Use of alcohol, tobacco or illicit drugs  . Current medications and supplements . Functional ability and status . Nutritional status . Physical activity . Advanced directives . List of other physicians . Hospitalizations, surgeries, and ER visits in previous 12 months . Vitals . Screenings to include cognitive, depression, and falls . Referrals and appointments  In addition, I have reviewed and discussed with patient certain preventive protocols, quality metrics, and best practice recommendations. A written personalized care plan for preventive services as well as general preventive health recommendations were provided to patient.     Kellie Simmering, LPN   0/02/1113   Nurse Notes:

## 2020-04-30 NOTE — Patient Instructions (Signed)
Nicholas [name] , Thank you for taking time to come for your Medicare Wellness Visit. I appreciate your ongoing commitment to your health goals. Please review the following plan we discussed and let me know if I can assist you in the future.   Screening recommendations/referrals: Colonoscopy: completed 12/07/2015 Recommended yearly ophthalmology/optometry visit for glaucoma screening and checkup Recommended yearly dental visit for hygiene and checkup  Vaccinations: Influenza vaccine: completed 10/14/2019, due 08/23/2020 Pneumococcal vaccine: completed 10/14/2019 Tdap vaccine: completed 06/11/2017, due 06/12/2027 Shingles vaccine: discussed   Covid-19:  12/01/2019, 04/07/2019, 03/07/2019  Advanced directives: Advance directive discussed with you today.   Conditions/risks identified: none  Next appointment: Follow up in one year for your annual wellness visit.   Preventive Care 75 Years and Older, Male Preventive care refers to lifestyle choices and visits with your health care provider that can promote health and wellness. What does preventive care include?  A yearly physical exam. This is also called an annual well check.  Dental exams once or twice a year.  Routine eye exams. Ask your health care provider how often you should have your eyes checked.  Personal lifestyle choices, including:  Daily care of your teeth and gums.  Regular physical activity.  Eating a healthy diet.  Avoiding tobacco and drug use.  Limiting alcohol use.  Practicing safe sex.  Taking low doses of aspirin every day.  Taking vitamin and mineral supplements as recommended by your health care provider. What happens during an annual well check? The services and screenings done by your health care provider during your annual well check will depend on your age, overall health, lifestyle risk factors, and family history of disease. Counseling  Your health care provider may ask you questions about  your:  Alcohol use.  Tobacco use.  Drug use.  Emotional well-being.  Home and relationship well-being.  Sexual activity.  Eating habits.  History of falls.  Memory and ability to understand (cognition).  Work and work Statistician. Screening  You may have the following tests or measurements:  Height, weight, and BMI.  Blood pressure.  Lipid and cholesterol levels. These may be checked every 5 years, or more frequently if you are over 79 years old.  Skin check.  Lung cancer screening. You may have this screening every year starting at age 52 if you have a 30-pack-year history of smoking and currently smoke or have quit within the past 15 years.  Fecal occult blood test (FOBT) of the stool. You may have this test every year starting at age 59.  Flexible sigmoidoscopy or colonoscopy. You may have a sigmoidoscopy every 5 years or a colonoscopy every 10 years starting at age 19.  Prostate cancer screening. Recommendations will vary depending on your family history and other risks.  Hepatitis C blood test.  Hepatitis B blood test.  Sexually transmitted disease (STD) testing.  Diabetes screening. This is done by checking your blood sugar (glucose) after you have not eaten for a while (fasting). You may have this done every 1-3 years.  Abdominal aortic aneurysm (AAA) screening. You may need this if you are a current or former smoker.  Osteoporosis. You may be screened starting at age 20 if you are at high risk. Talk with your health care provider about your test results, treatment options, and if necessary, the need for more tests. Vaccines  Your health care provider may recommend certain vaccines, such as:  Influenza vaccine. This is recommended every year.  Tetanus, diphtheria, and acellular pertussis (Tdap,  Td) vaccine. You may need a Td booster every 10 years.  Zoster vaccine. You may need this after age 48.  Pneumococcal 13-valent conjugate (PCV13) vaccine.  One dose is recommended after age 49.  Pneumococcal polysaccharide (PPSV23) vaccine. One dose is recommended after age 22. Talk to your health care provider about which screenings and vaccines you need and how often you need them. This information is not intended to replace advice given to you by your health care provider. Make sure you discuss any questions you have with your health care provider. Document Released: 02/05/2015 Document Revised: 09/29/2015 Document Reviewed: 11/10/2014 Elsevier Interactive Patient Education  2017 [place] Prevention in the Home Falls can cause injuries. They can happen to people of all ages. There are many things you can do to make your home safe and to help prevent falls. What can I do on the outside of my home?  Regularly fix the edges of walkways and driveways and fix any cracks.  Remove anything that might make you trip as you walk through a door, such as a raised step or threshold.  Trim any bushes or trees on the path to your home.  Use bright outdoor lighting.  Clear any walking paths of anything that might make someone trip, such as rocks or tools.  Regularly check to see if handrails are loose or broken. Make sure that both sides of any steps have handrails.  Any raised decks and porches should have guardrails on the edges.  Have any leaves, snow, or ice cleared regularly.  Use sand or salt on walking paths during winter.  Clean up any spills in your garage right away. This includes oil or grease spills. What can I do in the bathroom?  Use night lights.  Install grab bars by the toilet and in the tub and shower. Do not use towel bars as grab bars.  Use non-skid mats or decals in the tub or shower.  If you need to sit down in the shower, use a plastic, non-slip stool.  Keep the floor dry. Clean up any water that spills on the floor as soon as it happens.  Remove soap buildup in the tub or shower regularly.  Attach bath  mats securely with double-sided non-slip rug tape.  Do not have throw rugs and other things on the floor that can make you trip. What can I do in the bedroom?  Use night lights.  Make sure that you have a light by your bed that is easy to reach.  Do not use any sheets or blankets that are too big for your bed. They should not hang down onto the floor.  Have a firm chair that has side arms. You can use this for support while you get dressed.  Do not have throw rugs and other things on the floor that can make you trip. What can I do in the kitchen?  Clean up any spills right away.  Avoid walking on wet floors.  Keep items that you use a lot in easy-to-reach places.  If you need to reach something above you, use a strong step stool that has a grab bar.  Keep electrical cords out of the way.  Do not use floor polish or wax that makes floors slippery. If you must use wax, use non-skid floor wax.  Do not have throw rugs and other things on the floor that can make you trip. What can I do with my stairs?  Do not  leave any items on the stairs.  Make sure that there are handrails on both sides of the stairs and use them. Fix handrails that are broken or loose. Make sure that handrails are as long as the stairways.  Check any carpeting to make sure that it is firmly attached to the stairs. Fix any carpet that is loose or worn.  Avoid having throw rugs at the top or bottom of the stairs. If you do have throw rugs, attach them to the floor with carpet tape.  Make sure that you have a light switch at the top of the stairs and the bottom of the stairs. If you do not have them, ask someone to add them for you. What else can I do to help prevent falls?  Wear shoes that:  Do not have high heels.  Have rubber bottoms.  Are comfortable and fit you well.  Are closed at the toe. Do not wear sandals.  If you use a stepladder:  Make sure that it is fully opened. Do not climb a closed  stepladder.  Make sure that both sides of the stepladder are locked into place.  Ask someone to hold it for you, if possible.  Clearly mark and make sure that you can see:  Any grab bars or handrails.  First and last steps.  Where the edge of each step is.  Use tools that help you move around (mobility aids) if they are needed. These include:  Canes.  Walkers.  Scooters.  Crutches.  Turn on the lights when you go into a dark area. Replace any light bulbs as soon as they burn out.  Set up your furniture so you have a clear path. Avoid moving your furniture around.  If any of your floors are uneven, fix them.  If there are any pets around you, be aware of where they are.  Review your medicines with your doctor. Some medicines can make you feel dizzy. This can increase your chance of falling. Ask your doctor what other things that you can do to help prevent falls. This information is not intended to replace advice given to you by your health care provider. Make sure you discuss any questions you have with your health care provider. Document Released: 11/05/2008 Document Revised: 06/17/2015 Document Reviewed: 02/13/2014 Elsevier Interactive Patient Education  2017 [employer].

## 2020-05-10 ENCOUNTER — Ambulatory Visit (HOSPITAL_COMMUNITY): Payer: Medicare Other | Admitting: Physician Assistant

## 2020-05-11 ENCOUNTER — Ambulatory Visit: Payer: Medicare Other | Admitting: Cardiology

## 2020-05-11 ENCOUNTER — Encounter: Payer: Self-pay | Admitting: Cardiology

## 2020-05-11 ENCOUNTER — Telehealth: Payer: Self-pay

## 2020-05-11 ENCOUNTER — Other Ambulatory Visit: Payer: Self-pay

## 2020-05-11 VITALS — BP 132/72 | HR 73 | Ht 71.0 in | Wt 278.0 lb

## 2020-05-11 DIAGNOSIS — I480 Paroxysmal atrial fibrillation: Secondary | ICD-10-CM

## 2020-05-11 DIAGNOSIS — I442 Atrioventricular block, complete: Secondary | ICD-10-CM

## 2020-05-11 DIAGNOSIS — Z950 Presence of cardiac pacemaker: Secondary | ICD-10-CM | POA: Diagnosis not present

## 2020-05-11 LAB — CBC WITH DIFFERENTIAL/PLATELET
Basophils Absolute: 0.1 10*3/uL (ref 0.0–0.2)
Basos: 1 %
EOS (ABSOLUTE): 0.2 10*3/uL (ref 0.0–0.4)
Eos: 4 %
Hematocrit: 46.5 % (ref 37.5–51.0)
Hemoglobin: 15.5 g/dL (ref 13.0–17.7)
Immature Grans (Abs): 0 10*3/uL (ref 0.0–0.1)
Immature Granulocytes: 1 %
Lymphocytes Absolute: 1.5 10*3/uL (ref 0.7–3.1)
Lymphs: 24 %
MCH: 30.6 pg (ref 26.6–33.0)
MCHC: 33.3 g/dL (ref 31.5–35.7)
MCV: 92 fL (ref 79–97)
Monocytes Absolute: 0.6 10*3/uL (ref 0.1–0.9)
Monocytes: 10 %
Neutrophils Absolute: 4 10*3/uL (ref 1.4–7.0)
Neutrophils: 60 %
Platelets: 168 10*3/uL (ref 150–450)
RBC: 5.06 x10E6/uL (ref 4.14–5.80)
RDW: 12.9 % (ref 11.6–15.4)
WBC: 6.5 10*3/uL (ref 3.4–10.8)

## 2020-05-11 LAB — BASIC METABOLIC PANEL
BUN/Creatinine Ratio: 15 (ref 10–24)
BUN: 22 mg/dL (ref 8–27)
CO2: 23 mmol/L (ref 20–29)
Calcium: 9.6 mg/dL (ref 8.6–10.2)
Chloride: 100 mmol/L (ref 96–106)
Creatinine, Ser: 1.43 mg/dL — ABNORMAL HIGH (ref 0.76–1.27)
Glucose: 123 mg/dL — ABNORMAL HIGH (ref 65–99)
Potassium: 4.6 mmol/L (ref 3.5–5.2)
Sodium: 142 mmol/L (ref 134–144)
eGFR: 51 mL/min/{1.73_m2} — ABNORMAL LOW (ref >59)

## 2020-05-11 NOTE — Patient Instructions (Signed)
Medication Instructions:  Your physician recommends that you continue on your current medications as directed. Please refer to the Current Medication list given to you today. *If you need a refill on your cardiac medications before your next appointment, please call your pharmacy*  Lab Work: You will get lab work today:  CBC and BMP  If you have labs (blood work) drawn today and your tests are completely normal, you will receive your results only by: Marland Kitchen MyChart Message (if you have MyChart) OR . A paper copy in the mail If you have any lab test that is abnormal or we need to change your treatment, we will call you to review the results.  Testing/Procedures: None ordered.  Follow-Up: At [facility], you and your health needs are our priority.  As part of our continuing mission to provide you with exceptional heart care, we have created designated Provider Care Teams.  These Care Teams include your primary Cardiologist (physician) and Advanced Practice Providers (APPs -  Physician Assistants and Nurse Practitioners) who all work together to provide you with the care you need, when you need it.  Your next appointment:    SEE INSTRUCTION LETTER   Cardiac electrophysiology: From cell to bedside (7th ed., pp. 8937-3428). [place], [place]: Elsevier.">  Cardiac Ablation Cardiac ablation is a procedure to destroy, or ablate, a small amount of heart tissue in very specific places. The heart has many electrical connections. Sometimes these connections are abnormal and can cause the heart to beat very fast or irregularly. Ablating some of the areas that cause problems can improve the heart's rhythm or return it to normal. Ablation may be done for people who:  Have Wolff-Parkinson-White syndrome.  Have fast heart rhythms (tachycardia).  Have taken medicines for an abnormal heart rhythm (arrhythmia) that were not effective or caused side effects.  Have a high-risk heartbeat that may be  life-threatening. During the procedure, a small incision is made in the neck or the groin, and a long, thin tube (catheter) is inserted into the incision and moved to the heart. Small devices (electrodes) on the tip of the catheter will send out electrical currents. A type of X-ray (fluoroscopy) will be used to help guide the catheter and to provide images of the heart. Tell a health care provider about:  Any allergies you have.  All medicines you are taking, including vitamins, herbs, eye drops, creams, and over-the-counter medicines.  Any problems you or family members have had with anesthetic medicines.  Any blood disorders you have.  Any surgeries you have had.  Any medical conditions you have, such as kidney failure.  Whether you are pregnant or may be pregnant. What are the risks? Generally, this is a safe procedure. However, problems may occur, including:  Infection.  Bruising and bleeding at the catheter insertion site.  Bleeding into the chest, especially into the sac that surrounds the heart. This is a serious complication.  Stroke or blood clots.  Damage to nearby structures or organs.  Allergic reaction to medicines or dyes.  Need for a permanent pacemaker if the normal electrical system is damaged. A pacemaker is a small computer that sends electrical signals to the heart and helps your heart beat normally.  The procedure not being fully effective. This may not be recognized until months later. Repeat ablation procedures are sometimes done. What happens before the procedure? Medicines Ask your health care provider about:  Changing or stopping your regular medicines. This is especially important if you are taking  diabetes medicines or blood thinners.  Taking medicines such as aspirin and ibuprofen. These medicines can thin your blood. Do not take these medicines unless your health care provider tells you to take them.  Taking over-the-counter medicines,  vitamins, herbs, and supplements. General instructions  Follow instructions from your health care provider about eating or drinking restrictions.  Plan to have someone take you home from the hospital or clinic.  If you will be going home right after the procedure, plan to have someone with you for 24 hours.  Ask your health care provider what steps will be taken to prevent infection. What happens during the procedure?  An IV will be inserted into one of your veins.  You will be given a medicine to help you relax (sedative).  The skin on your neck or groin will be numbed.  An incision will be made in your neck or your groin.  A needle will be inserted through the incision and into a large vein in your neck or groin.  A catheter will be inserted into the needle and moved to your heart.  Dye may be injected through the catheter to help your surgeon see the area of the heart that needs treatment.  Electrical currents will be sent from the catheter to ablate heart tissue in desired areas. There are three types of energy that may be used to do this: ? Heat (radiofrequency energy). ? Laser energy. ? Extreme cold (cryoablation).  When the tissue has been ablated, the catheter will be removed.  Pressure will be held on the insertion area to prevent a lot of bleeding.  A bandage (dressing) will be placed over the insertion area. The exact procedure may vary among health care providers and hospitals.   What happens after the procedure?  Your blood pressure, heart rate, breathing rate, and blood oxygen level will be monitored until you leave the hospital or clinic.  Your insertion area will be monitored for bleeding. You will need to lie still for a few hours to ensure that you do not bleed from the insertion area.  Do not drive for 24 hours or as long as told by your health care provider. Summary  Cardiac ablation is a procedure to destroy, or ablate, a small amount of heart tissue  using an electrical current. This procedure can improve the heart rhythm or return it to normal.  Tell your health care provider about any medical conditions you may have and all medicines you are taking to treat them.  This is a safe procedure, but problems may occur. Problems may include infection, bruising, damage to nearby organs or structures, or allergic reactions to medicines.  Follow your health care provider's instructions about eating and drinking before the procedure. You may also be told to change or stop some of your medicines.  After the procedure, do not drive for 24 hours or as long as told by your health care provider. This information is not intended to replace advice given to you by your health care provider. Make sure you discuss any questions you have with your health care provider. Document Revised: 11/18/2018 Document Reviewed: 11/18/2018 Elsevier Patient Education  [employer].

## 2020-05-11 NOTE — H&P (View-Only) (Signed)
Electrophysiology Office Follow up Visit Note:    Date:  05/11/2020   ID:  Nicholas [name], DOB 05/13/1945, MRN [id number]  PCP:  Venita Lick, NP  [facility] Cardiologist:  No primary care provider on file.  [facility] Electrophysiologist:  Vickie Epley, MD    Interval History:    Nicholas [name] is a 75 y.o. male who presents for a follow up visit after a canceled ablation on April 12, 2020.  When he arrived for his ablation he had continued symptoms of a urinary tract infection with right flank pain radiating to the groin.  I was concerned that he may have a renal stone.  Since that time he did have a CT of the abdomen and pelvis which did not show any evidence of renal stone. He is also been seen by his primary, [name], on April 29, 2020.  At that appointment, she reviewed the results of the CT with him and felt that it was safe to proceed with ablation.  Today the patient is doing well.  He is with his wife in clinic.  They are interested in rescheduling the ablation.  Past Medical History:  Diagnosis Date  . Anterior urethral stricture   . Anxiety   . Arthritis    a. knees, hips, hands;  b. 11/2013 s/p L TKA @ [place].  . Bile reflux gastritis   . Bulging lumbar disc   . BXO (balanitis xerotica obliterans)   . 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 Depression   . DVT (deep venous thrombosis) ([place])   . Erosive esophagitis   . Gross hematuria   . Hyperlipemia   . Hypertension    borderline  . Internal hemorrhoids   . Phimosis   . Pulmonary embolism [facility])     Past Surgical History:  Procedure Laterality Date  . BUNIONECTOMY Bilateral 01/06/2015   Procedure: BUNIONECTOMY;  Surgeon: [name], MD;  Location: [facility] ORS;  Service: Orthopedics;  Laterality: Bilateral;  . CARDIAC CATHETERIZATION  ~ 2005   "once"  . CATARACT EXTRACTION W/ INTRAOCULAR LENS  IMPLANT, BILATERAL Bilateral ~ 2010  .  COLONOSCOPY WITH PROPOFOL N/A 12/07/2015   Procedure: COLONOSCOPY WITH PROPOFOL;  Surgeon: Lollie Sails, MD;  Location: [facility] ENDOSCOPY;  Service: Endoscopy;  Laterality: N/A;  . EP IMPLANTABLE DEVICE N/A 02/16/2016   MDT dual chamber (His Bundle) pacemaker implanted by Dr Caryl Comes for intermittent complete heart block  . ESOPHAGOGASTRODUODENOSCOPY (EGD) WITH PROPOFOL N/A 12/07/2015   Procedure: ESOPHAGOGASTRODUODENOSCOPY (EGD) WITH PROPOFOL;  Surgeon: Lollie Sails, MD;  Location[facility] ENDOSCOPY;  Service: Endoscopy;  Laterality: N/A;  . ESOPHAGOGASTRODUODENOSCOPY (EGD) WITH PROPOFOL N/A 05/17/2017   Procedure: ESOPHAGOGASTRODUODENOSCOPY (EGD) WITH PROPOFOL;  Surgeon: Lollie Sails, MD;  Location: [facility] ENDOSCOPY;  Service: Endoscopy;  Laterality: N/A;  . HAMMER TOE SURGERY Bilateral 01/06/2015   Procedure: HAMMER TOE CORRECTION;  Surgeon: [name], MD;  Location: [facility] ORS;  Service: Orthopedics;  Laterality: Bilateral;  . KNEE CARTILAGE SURGERY Left 1965   "football injury"  . Left Total Knee Arthroplasty     a. 11/2013 [facility].  Marland Kitchen PILONIDAL CYST EXCISION  1970's  . TOTAL HIP ARTHROPLASTY Right 2004  . TOTAL HIP ARTHROPLASTY Left 2006  . UPPER GI ENDOSCOPY      Current Medications: Current Meds  Medication Sig  . acetaminophen (TYLENOL) 650 MG CR tablet Take 650 mg by mouth every 8 (eight)  hours as needed for pain.  Marland Kitchen apixaban (ELIQUIS) 5 MG TABS tablet Take 5 mg by mouth 2 (two) times daily.  . Ascorbic Acid (VITAMIN C) 1000 MG tablet Take 1,000 mg by mouth daily.  . Cranberry 500 MG CAPS Take 500 mg by mouth daily.  . diphenhydramine-acetaminophen (TYLENOL PM) 25-500 MG TABS tablet Take 1 tablet by mouth at bedtime as needed (sleep).  . Ferrous Gluconate (IRON 27 PO) Take 27 mg by mouth daily.  . fludrocortisone (FLORINEF) 0.1 MG tablet Take 0.1 mg by mouth daily.  . fluticasone (FLONASE) 50 MCG/ACT nasal spray fluticasone propionate 50 mcg/actuation nasal spray,suspension   . gabapentin (NEURONTIN) 600 MG tablet Take 1 tablet (600 mg total) by mouth at bedtime.  Marland Kitchen HYDROcodone-acetaminophen (NORCO) 7.5-325 MG tablet Take 1 tablet by mouth every 6 (six) hours as needed for moderate pain or severe pain.  Marland Kitchen HYDROcodone-acetaminophen (NORCO/VICODIN) 5-325 MG tablet Take 1 tablet by mouth in the morning and at bedtime.  Marland Kitchen HYDROcodone-acetaminophen (NORCO/VICODIN) 5-325 MG tablet Take 1 tablet by mouth 2 (two) times daily as needed for moderate pain or severe pain.  . hyoscyamine (LEVSIN, ANASPAZ) 0.125 MG tablet Take 0.125 mg by mouth every 6 (six) hours as needed for bladder spasms or cramping.   Marland Kitchen LORazepam (ATIVAN) 1 MG tablet Take 1 tablet (1 mg total) by mouth at bedtime.  . Magnesium 400 MG TABS Take 400 mg by mouth daily.   . Melatonin 10 MG TABS Take 10 mg by mouth at bedtime.   . methocarbamol (ROBAXIN) 500 MG tablet Take 1 tablet (500 mg total) by mouth 2 (two) times daily as needed for muscle spasms.  . midodrine (PROAMATINE) 10 MG tablet Take 1 tablet (10 mg total) by mouth 3 (three) times daily.  . midodrine (PROAMATINE) 5 MG tablet Take 10 mg by mouth in the morning, at noon, and at bedtime.  . Multiple Vitamin (MULTIVITAMIN WITH MINERALS) TABS tablet Take 1 tablet by mouth daily. Centrum Silver  . nystatin (MYCOSTATIN/NYSTOP) powder Apply 1 application topically 3 (three) times daily.  . Omega-3 Fatty Acids (FISH OIL) 1200 MG CAPS Take 1,200 mg by mouth daily.  Marland Kitchen oxymetazoline (AFRIN) 0.05 % nasal spray Place 2 sprays into both nostrils at bedtime.  . pantoprazole (PROTONIX) 20 MG tablet Take 1 tablet (20 mg total) by mouth daily.  . potassium chloride SA (KLOR-CON) 20 MEQ tablet Take 2 tablets (40 meq) x 1 dose as directed (Patient taking differently: Take 40 mEq by mouth daily as needed (with torsemide (fluid retention)).)  . QUEtiapine Fumarate (SEROQUEL XR) 150 MG 24 hr tablet TAKE 1 TABLET (150 MG TOTAL) BY MOUTH AT BEDTIME.  Marland Kitchen rOPINIRole (REQUIP) 0.25  MG tablet Take 0.25 mg by mouth 3 (three) times daily.  . rosuvastatin (CRESTOR) 40 MG tablet Take 1 tablet (40 mg total) by mouth daily.  . solifenacin (VESICARE) 10 MG tablet Take 1 tablet (10 mg total) by mouth daily.  Marland Kitchen torsemide (DEMADEX) 20 MG tablet Take 40 mg by mouth daily as needed (fluid retention).  . triamcinolone cream (KENALOG) 0.1 % Apply 1 application topically 2 (two) times daily as needed (skin irritation).  . venlafaxine XR (EFFEXOR XR) 75 MG 24 hr capsule Take 3 capsules (225 mg total) by mouth daily.     Allergies:   Patient has no known allergies.   Social History   Socioeconomic History  . Marital status: Married    Spouse name: Not on file  . Number of children:  Not on file  . Years of education: Not on file  . Highest education level: High school graduate  Occupational History  . Occupation: retired   Tobacco Use  . Smoking status: Never Smoker  . Smokeless tobacco: Never Used  Vaping Use  . Vaping Use: Never used  Substance and Sexual Activity  . Alcohol use: Yes    Alcohol/week: 0.0 standard drinks    Comment: beers seldom   . Drug use: No  . Sexual activity: Not Currently  Other Topics Concern  . Not on file  Social History Narrative   Lives in [place] with wife.  Does not routinely exercise.  Activity severely limited by bilateral knee pain.   Social Determinants of Health   Financial Resource Strain: Low Risk   . Difficulty of Paying Living Expenses: Not hard at all  Food Insecurity: No Food Insecurity  . Worried About Charity fundraiser in the Last Year: Never true  . Ran Out of Food in the Last Year: Never true  Transportation Needs: No Transportation Needs  . Lack of Transportation (Medical): No  . Lack of Transportation (Non-Medical): No  Physical Activity: Inactive  . Days of Exercise per Week: 0 days  . Minutes of Exercise per Session: 0 min  Stress: No Stress Concern Present  . Feeling of Stress : Not at all  Social Connections:  Not on file     Family History: The patient's family history includes Alcoholism in his mother; Breast cancer in his sister; Cancer in his mother; Diabetes in his father; Glaucoma in his sister; Hypertension in his father; Prostate cancer in his paternal grandfather; Stroke in his father.  ROS:   Please see the history of present illness.    All other systems reviewed and are negative.  EKGs/Labs/Other Studies Reviewed:    The following studies were reviewed today:  04/28/2020 CT Abd/Pelvis FINDINGS: Lower chest: The lung bases are clear. The heart size is normal.  Hepatobiliary: The liver is normal. Normal gallbladder.There is no biliary ductal dilation.  Pancreas: Normal contours without ductal dilatation. No peripancreatic fluid collection.  Spleen: Unremarkable.  Adrenals/Urinary Tract:  --Adrenal glands: Unremarkable.  --Right kidney/ureter: No hydronephrosis or radiopaque kidney stones.  --Left kidney/ureter: No hydronephrosis or radiopaque kidney stones.  --Urinary bladder: Unremarkable.  Stomach/Bowel:  --Stomach/Duodenum: No hiatal hernia or other gastric abnormality. Normal duodenal course and caliber.  --Small bowel: Unremarkable.  --Colon: Unremarkable.  --Appendix: Normal.  Vascular/Lymphatic: Atherosclerotic calcification is present within the non-aneurysmal abdominal aorta, without hemodynamically significant stenosis.  --No retroperitoneal lymphadenopathy.  --No mesenteric lymphadenopathy.  --No pelvic or inguinal lymphadenopathy.  Reproductive: Unremarkable  Other: No ascites or free air. The abdominal wall is normal.  Musculoskeletal. No bony spinal canal stenosis or focal osseous abnormality.  IMPRESSION: No CT findings to explain the patient's flank pain.    May 11, 2020 device interrogation in clinic personally reviewed Lead parameter stable A. fib burden 33%   EKG:  The ekg ordered today demonstrates  His bundle area pacing, sinus rhythm  Recent Labs: 11/10/2019: Magnesium 2.3; TSH 4.140 11/22/2019: ALT 19 04/08/2020: BUN 21; Creatinine, Ser 1.10; Hemoglobin 15.9; Platelets 171; Potassium 4.5; Sodium 139  Recent Lipid Panel    Component Value Date/Time   CHOL 171 02/11/2020 0942   CHOL 198 01/03/2018 1610   TRIG 147 02/11/2020 0942   TRIG 264 (H) 01/03/2018 1610   HDL 61 02/11/2020 0942   CHOLHDL 3.6 07/16/2018 0813   VLDL 53 (H) 01/03/2018 1610  [place] 85 02/11/2020 0942    Physical Exam:    VS:  BP 132/72 (BP Location: Left Arm, Patient Position: Sitting, Cuff Size: Normal)   Pulse 73   Ht 5\' 11"  (1.803 m)   Wt 278 lb (126.1 kg)   SpO2 94%   BMI 38.77 kg/m     Wt Readings from Last 3 Encounters:  05/11/20 278 lb (126.1 kg)  04/30/20 280 lb (127 kg)  04/29/20 280 lb 6.4 oz (127.2 kg)     GEN:  Well nourished, well developed in no acute distress HEENT: Normal NECK: No JVD; No carotid bruits LYMPHATICS: No lymphadenopathy CARDIAC: RRR, no murmurs, rubs, gallops RESPIRATORY:  Clear to auscultation without rales, wheezing or rhonchi  ABDOMEN: Soft, non-tender, non-distended MUSCULOSKELETAL:  No edema; No deformity  SKIN: Warm and dry NEUROLOGIC:  Alert and oriented x 3 PSYCHIATRIC:  Normal affect   ASSESSMENT:    1. Complete heart block ([place])   2. Pacemaker - MDT   3. PAF (paroxysmal atrial fibrillation) (HCC)    PLAN:    In order of problems listed above:  1. Paroxysmal atrial fibrillation On Eliquis for stroke prophylaxis. 33% burden on today's device interrogation. Discussed rescheduling the ablation and he would like to proceed.  Risk, benefits, and alternatives to EP study and radiofrequency ablation for afib were also discussed in detail today. These risks include but are not limited to stroke, bleeding, vascular damage, tamponade, perforation, damage to the esophagus, lungs, and other structures, pulmonary vein stenosis, worsening renal function,  and death. The patient understands these risk and wishes to proceed.  We will therefore proceed with catheter ablation at the next available time.  Carto, ICE, anesthesia are requested for the procedure.   2.  Flank pain CT did not show evidence of infection.  Pain is improving.  3.  Complete heart block post dual-chamber permanent pacemaker Device interrogation shows stable lead parameters.  Total time spent with patient today 45 minutes. This includes reviewing records, evaluating the patient and coordinating care.   Medication Adjustments/Labs and Tests Ordered: Current medicines are reviewed at length with the patient today.  Concerns regarding medicines are outlined above.  Orders Placed This Encounter  Procedures  . Basic Metabolic Panel (BMET)  . CBC w/Diff  . EKG 12-Lead   No orders of the defined types were placed in this encounter.    Signed, Lars Mage, MD, [facility], [facility] 05/11/2020 9:54 AM    Electrophysiology [facility]

## 2020-05-11 NOTE — Telephone Encounter (Signed)
Lvm to change 4/20 appt to virtual

## 2020-05-11 NOTE — Progress Notes (Signed)
Electrophysiology Office Follow up Visit Note:    Date:  05/11/2020   ID:  Nicholas [name], DOB February 01, 1945, MRN [id number]  PCP:  Venita Lick, NP  [facility] Cardiologist:  No primary care provider on file.  [facility] Electrophysiologist:  Vickie Epley, MD    Interval History:    Nicholas [name] is a 75 y.o. male who presents for a follow up visit after a canceled ablation on April 12, 2020.  When he arrived for his ablation he had continued symptoms of a urinary tract infection with right flank pain radiating to the groin.  I was concerned that he may have a renal stone.  Since that time he did have a CT of the abdomen and pelvis which did not show any evidence of renal stone. He is also been seen by his primary, [name], on April 29, 2020.  At that appointment, she reviewed the results of the CT with him and felt that it was safe to proceed with ablation.  Today the patient is doing well.  He is with his wife in clinic.  They are interested in rescheduling the ablation.  Past Medical History:  Diagnosis Date  . Anterior urethral stricture   . Anxiety   . Arthritis    a. knees, hips, hands;  b. 11/2013 s/p L TKA @ [place].  . Bile reflux gastritis   . Bulging lumbar disc   . BXO (balanitis xerotica obliterans)   . 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 Depression   . DVT (deep venous thrombosis) ([place])   . Erosive esophagitis   . Gross hematuria   . Hyperlipemia   . Hypertension    borderline  . Internal hemorrhoids   . Phimosis   . Pulmonary embolism [facility])     Past Surgical History:  Procedure Laterality Date  . BUNIONECTOMY Bilateral 01/06/2015   Procedure: BUNIONECTOMY;  Surgeon: [name], MD;  Location: [facility] ORS;  Service: Orthopedics;  Laterality: Bilateral;  . CARDIAC CATHETERIZATION  ~ 2005   "once"  . CATARACT EXTRACTION W/ INTRAOCULAR LENS  IMPLANT, BILATERAL Bilateral ~ 2010  .  COLONOSCOPY WITH PROPOFOL N/A 12/07/2015   Procedure: COLONOSCOPY WITH PROPOFOL;  Surgeon: Lollie Sails, MD;  Location: [facility] ENDOSCOPY;  Service: Endoscopy;  Laterality: N/A;  . EP IMPLANTABLE DEVICE N/A 02/16/2016   MDT dual chamber (His Bundle) pacemaker implanted by Dr Caryl Comes for intermittent complete heart block  . ESOPHAGOGASTRODUODENOSCOPY (EGD) WITH PROPOFOL N/A 12/07/2015   Procedure: ESOPHAGOGASTRODUODENOSCOPY (EGD) WITH PROPOFOL;  Surgeon: Lollie Sails, MD;  Location[facility]COPY;  Service: Endoscopy;  Laterality: N/A;  . ESOPHAGOGASTRODUODENOSCOPY (EGD) WITH PROPOFOL N/A 05/17/2017   Procedure: ESOPHAGOGASTRODUODENOSCOPY (EGD) WITH PROPOFOL;  Surgeon: Lollie Sails, MD;  Location: [facility] ENDOSCOPY;  Service: Endoscopy;  Laterality: N/A;  . HAMMER TOE SURGERY Bilateral 01/06/2015   Procedure: HAMMER TOE CORRECTION;  Surgeon: [name], MD;  Location: [facility] ORS;  Service: Orthopedics;  Laterality: Bilateral;  . KNEE CARTILAGE SURGERY Left 1965   "football injury"  . Left Total Knee Arthroplasty     a. 11/2013 [facility].  Marland Kitchen PILONIDAL CYST EXCISION  1970's  . TOTAL HIP ARTHROPLASTY Right 2004  . TOTAL HIP ARTHROPLASTY Left 2006  . UPPER GI ENDOSCOPY      Current Medications: Current Meds  Medication Sig  . acetaminophen (TYLENOL) 650 MG CR tablet Take 650 mg by mouth every 8 (eight)  hours as needed for pain.  Marland Kitchen apixaban (ELIQUIS) 5 MG TABS tablet Take 5 mg by mouth 2 (two) times daily.  . Ascorbic Acid (VITAMIN C) 1000 MG tablet Take 1,000 mg by mouth daily.  . Cranberry 500 MG CAPS Take 500 mg by mouth daily.  . diphenhydramine-acetaminophen (TYLENOL PM) 25-500 MG TABS tablet Take 1 tablet by mouth at bedtime as needed (sleep).  . Ferrous Gluconate (IRON 27 PO) Take 27 mg by mouth daily.  . fludrocortisone (FLORINEF) 0.1 MG tablet Take 0.1 mg by mouth daily.  . fluticasone (FLONASE) 50 MCG/ACT nasal spray fluticasone propionate 50 mcg/actuation nasal spray,suspension   . gabapentin (NEURONTIN) 600 MG tablet Take 1 tablet (600 mg total) by mouth at bedtime.  Marland Kitchen HYDROcodone-acetaminophen (NORCO) 7.5-325 MG tablet Take 1 tablet by mouth every 6 (six) hours as needed for moderate pain or severe pain.  Marland Kitchen HYDROcodone-acetaminophen (NORCO/VICODIN) 5-325 MG tablet Take 1 tablet by mouth in the morning and at bedtime.  Marland Kitchen HYDROcodone-acetaminophen (NORCO/VICODIN) 5-325 MG tablet Take 1 tablet by mouth 2 (two) times daily as needed for moderate pain or severe pain.  . hyoscyamine (LEVSIN, ANASPAZ) 0.125 MG tablet Take 0.125 mg by mouth every 6 (six) hours as needed for bladder spasms or cramping.   Marland Kitchen LORazepam (ATIVAN) 1 MG tablet Take 1 tablet (1 mg total) by mouth at bedtime.  . Magnesium 400 MG TABS Take 400 mg by mouth daily.   . Melatonin 10 MG TABS Take 10 mg by mouth at bedtime.   . methocarbamol (ROBAXIN) 500 MG tablet Take 1 tablet (500 mg total) by mouth 2 (two) times daily as needed for muscle spasms.  . midodrine (PROAMATINE) 10 MG tablet Take 1 tablet (10 mg total) by mouth 3 (three) times daily.  . midodrine (PROAMATINE) 5 MG tablet Take 10 mg by mouth in the morning, at noon, and at bedtime.  . Multiple Vitamin (MULTIVITAMIN WITH MINERALS) TABS tablet Take 1 tablet by mouth daily. Centrum Silver  . nystatin (MYCOSTATIN/NYSTOP) powder Apply 1 application topically 3 (three) times daily.  . Omega-3 Fatty Acids (FISH OIL) 1200 MG CAPS Take 1,200 mg by mouth daily.  Marland Kitchen oxymetazoline (AFRIN) 0.05 % nasal spray Place 2 sprays into both nostrils at bedtime.  . pantoprazole (PROTONIX) 20 MG tablet Take 1 tablet (20 mg total) by mouth daily.  . potassium chloride SA (KLOR-CON) 20 MEQ tablet Take 2 tablets (40 meq) x 1 dose as directed (Patient taking differently: Take 40 mEq by mouth daily as needed (with torsemide (fluid retention)).)  . QUEtiapine Fumarate (SEROQUEL XR) 150 MG 24 hr tablet TAKE 1 TABLET (150 MG TOTAL) BY MOUTH AT BEDTIME.  Marland Kitchen rOPINIRole (REQUIP) 0.25  MG tablet Take 0.25 mg by mouth 3 (three) times daily.  . rosuvastatin (CRESTOR) 40 MG tablet Take 1 tablet (40 mg total) by mouth daily.  . solifenacin (VESICARE) 10 MG tablet Take 1 tablet (10 mg total) by mouth daily.  Marland Kitchen torsemide (DEMADEX) 20 MG tablet Take 40 mg by mouth daily as needed (fluid retention).  . triamcinolone cream (KENALOG) 0.1 % Apply 1 application topically 2 (two) times daily as needed (skin irritation).  . venlafaxine XR (EFFEXOR XR) 75 MG 24 hr capsule Take 3 capsules (225 mg total) by mouth daily.     Allergies:   Patient has no known allergies.   Social History   Socioeconomic History  . Marital status: Married    Spouse name: Not on file  . Number of children:  Not on file  . Years of education: Not on file  . Highest education level: High school graduate  Occupational History  . Occupation: retired   Tobacco Use  . Smoking status: Never Smoker  . Smokeless tobacco: Never Used  Vaping Use  . Vaping Use: Never used  Substance and Sexual Activity  . Alcohol use: Yes    Alcohol/week: 0.0 standard drinks    Comment: beers seldom   . Drug use: No  . Sexual activity: Not Currently  Other Topics Concern  . Not on file  Social History Narrative   Lives in [place] with wife.  Does not routinely exercise.  Activity severely limited by bilateral knee pain.   Social Determinants of Health   Financial Resource Strain: Low Risk   . Difficulty of Paying Living Expenses: Not hard at all  Food Insecurity: No Food Insecurity  . Worried About Charity fundraiser in the Last Year: Never true  . Ran Out of Food in the Last Year: Never true  Transportation Needs: No Transportation Needs  . Lack of Transportation (Medical): No  . Lack of Transportation (Non-Medical): No  Physical Activity: Inactive  . Days of Exercise per Week: 0 days  . Minutes of Exercise per Session: 0 min  Stress: No Stress Concern Present  . Feeling of Stress : Not at all  Social Connections:  Not on file     Family History: The patient's family history includes Alcoholism in his mother; Breast cancer in his sister; Cancer in his mother; Diabetes in his father; Glaucoma in his sister; Hypertension in his father; Prostate cancer in his paternal grandfather; Stroke in his father.  ROS:   Please see the history of present illness.    All other systems reviewed and are negative.  EKGs/Labs/Other Studies Reviewed:    The following studies were reviewed today:  04/28/2020 CT Abd/Pelvis FINDINGS: Lower chest: The lung bases are clear. The heart size is normal.  Hepatobiliary: The liver is normal. Normal gallbladder.There is no biliary ductal dilation.  Pancreas: Normal contours without ductal dilatation. No peripancreatic fluid collection.  Spleen: Unremarkable.  Adrenals/Urinary Tract:  --Adrenal glands: Unremarkable.  --Right kidney/ureter: No hydronephrosis or radiopaque kidney stones.  --Left kidney/ureter: No hydronephrosis or radiopaque kidney stones.  --Urinary bladder: Unremarkable.  Stomach/Bowel:  --Stomach/Duodenum: No hiatal hernia or other gastric abnormality. Normal duodenal course and caliber.  --Small bowel: Unremarkable.  --Colon: Unremarkable.  --Appendix: Normal.  Vascular/Lymphatic: Atherosclerotic calcification is present within the non-aneurysmal abdominal aorta, without hemodynamically significant stenosis.  --No retroperitoneal lymphadenopathy.  --No mesenteric lymphadenopathy.  --No pelvic or inguinal lymphadenopathy.  Reproductive: Unremarkable  Other: No ascites or free air. The abdominal wall is normal.  Musculoskeletal. No bony spinal canal stenosis or focal osseous abnormality.  IMPRESSION: No CT findings to explain the patient's flank pain.    May 11, 2020 device interrogation in clinic personally reviewed Lead parameter stable A. fib burden 33%   EKG:  The ekg ordered today demonstrates  His bundle area pacing, sinus rhythm  Recent Labs: 11/10/2019: Magnesium 2.3; TSH 4.140 11/22/2019: ALT 19 04/08/2020: BUN 21; Creatinine, Ser 1.10; Hemoglobin 15.9; Platelets 171; Potassium 4.5; Sodium 139  Recent Lipid Panel    Component Value Date/Time   CHOL 171 02/11/2020 0942   CHOL 198 01/03/2018 1610   TRIG 147 02/11/2020 0942   TRIG 264 (H) 01/03/2018 1610   HDL 61 02/11/2020 0942   CHOLHDL 3.6 07/16/2018 0813   VLDL 53 (H) 01/03/2018 1610  [place] 85 02/11/2020 0942    Physical Exam:    VS:  BP 132/72 (BP Location: Left Arm, Patient Position: Sitting, Cuff Size: Normal)   Pulse 73   Ht 5\' 11"  (1.803 m)   Wt 278 lb (126.1 kg)   SpO2 94%   BMI 38.77 kg/m     Wt Readings from Last 3 Encounters:  05/11/20 278 lb (126.1 kg)  04/30/20 280 lb (127 kg)  04/29/20 280 lb 6.4 oz (127.2 kg)     GEN:  Well nourished, well developed in no acute distress HEENT: Normal NECK: No JVD; No carotid bruits LYMPHATICS: No lymphadenopathy CARDIAC: RRR, no murmurs, rubs, gallops RESPIRATORY:  Clear to auscultation without rales, wheezing or rhonchi  ABDOMEN: Soft, non-tender, non-distended MUSCULOSKELETAL:  No edema; No deformity  SKIN: Warm and dry NEUROLOGIC:  Alert and oriented x 3 PSYCHIATRIC:  Normal affect   ASSESSMENT:    1. Complete heart block ([place])   2. Pacemaker - MDT   3. PAF (paroxysmal atrial fibrillation) (HCC)    PLAN:    In order of problems listed above:  1. Paroxysmal atrial fibrillation On Eliquis for stroke prophylaxis. 33% burden on today's device interrogation. Discussed rescheduling the ablation and he would like to proceed.  Risk, benefits, and alternatives to EP study and radiofrequency ablation for afib were also discussed in detail today. These risks include but are not limited to stroke, bleeding, vascular damage, tamponade, perforation, damage to the esophagus, lungs, and other structures, pulmonary vein stenosis, worsening renal function,  and death. The patient understands these risk and wishes to proceed.  We will therefore proceed with catheter ablation at the next available time.  Carto, ICE, anesthesia are requested for the procedure.   2.  Flank pain CT did not show evidence of infection.  Pain is improving.  3.  Complete heart block post dual-chamber permanent pacemaker Device interrogation shows stable lead parameters.  Total time spent with patient today 45 minutes. This includes reviewing records, evaluating the patient and coordinating care.   Medication Adjustments/Labs and Tests Ordered: Current medicines are reviewed at length with the patient today.  Concerns regarding medicines are outlined above.  Orders Placed This Encounter  Procedures  . Basic Metabolic Panel (BMET)  . CBC w/Diff  . EKG 12-Lead   No orders of the defined types were placed in this encounter.    Signed, Lars Mage, MD, [facility], [facility] 05/11/2020 9:54 AM    Electrophysiology [facility]

## 2020-05-12 ENCOUNTER — Encounter: Payer: Self-pay | Admitting: Nurse Practitioner

## 2020-05-12 ENCOUNTER — Ambulatory Visit: Payer: Medicare Other | Admitting: Nurse Practitioner

## 2020-05-12 ENCOUNTER — Telehealth (INDEPENDENT_AMBULATORY_CARE_PROVIDER_SITE_OTHER): Payer: Medicare Other | Admitting: Nurse Practitioner

## 2020-05-12 VITALS — BP 96/55

## 2020-05-12 DIAGNOSIS — R7301 Impaired fasting glucose: Secondary | ICD-10-CM

## 2020-05-12 DIAGNOSIS — G894 Chronic pain syndrome: Secondary | ICD-10-CM

## 2020-05-12 DIAGNOSIS — F419 Anxiety disorder, unspecified: Secondary | ICD-10-CM

## 2020-05-12 DIAGNOSIS — Z79899 Other long term (current) drug therapy: Secondary | ICD-10-CM

## 2020-05-12 DIAGNOSIS — N1831 Chronic kidney disease, stage 3a: Secondary | ICD-10-CM

## 2020-05-12 DIAGNOSIS — E785 Hyperlipidemia, unspecified: Secondary | ICD-10-CM

## 2020-05-12 DIAGNOSIS — I480 Paroxysmal atrial fibrillation: Secondary | ICD-10-CM

## 2020-05-12 DIAGNOSIS — F339 Major depressive disorder, recurrent, unspecified: Secondary | ICD-10-CM | POA: Diagnosis not present

## 2020-05-12 DIAGNOSIS — I714 Abdominal aortic aneurysm, without rupture, unspecified: Secondary | ICD-10-CM

## 2020-05-12 DIAGNOSIS — I442 Atrioventricular block, complete: Secondary | ICD-10-CM

## 2020-05-12 DIAGNOSIS — I951 Orthostatic hypotension: Secondary | ICD-10-CM

## 2020-05-12 DIAGNOSIS — D6869 Other thrombophilia: Secondary | ICD-10-CM

## 2020-05-12 DIAGNOSIS — I10 Essential (primary) hypertension: Secondary | ICD-10-CM

## 2020-05-12 NOTE — Assessment & Plan Note (Signed)
Chronic, ongoing.  Denies SI/HI.  Continue current medication regimen and adjust as needed.  Would benefit from trial off Effexor and trial of SSRI, but refuses this.   

## 2020-05-12 NOTE — Assessment & Plan Note (Signed)
Ongoing stage 3a, recheck BMP next visit, current up to date, and adjust medications as needed.

## 2020-05-12 NOTE — Assessment & Plan Note (Signed)
Chronic, ongoing.  Discussed at length risk of benzo and opioid use in conjunction with each other.  Recommend he not take the two together at same hour during day or evening.  He tried to cut back to 1/2 tablet but was unsuccessful in past.   Continue to collaborate with CCM team on education.  Provided wife and him with copy of [facility] benzo risk information patient sheet in past.  They request 90 day supply, as previous PCP supplied this.  Are aware he does have to return every 3 months for refills.  UDS up to date with pain management in August 2021.  Refills sent.  Controlled substance contract next visit.

## 2020-05-12 NOTE — Assessment & Plan Note (Signed)
Continue collaboration with cardiology + neurology and medication regimen as prescribed by them.  Recommend ensuring good hydration throughout daytime hours + use of compression hose at home (on during daytime and off during night), agrees to try them.

## 2020-05-12 NOTE — Assessment & Plan Note (Signed)
Chronic, stable, followed by cardiology.  Continue current medication regimen as prescribed by them.  Labs up to date and ablation upcoming on 05/17/20.

## 2020-05-12 NOTE — Assessment & Plan Note (Signed)
Chronic, ongoing.  Followed by cardiology.  BP below goal at home.  With orthostatic BP presenting occasionally will continue current regimen at this time and have recommended utilizing compression hose at home, agrees to try this.  Continue collaboration with cardiology + neurology and current medication regimen.  Labs up to date.  Recommend ensuring good fluid intake at home.  Return in 3 months.

## 2020-05-12 NOTE — Assessment & Plan Note (Addendum)
Noted on imaging on 11/22/19.  With recommendation to monitor annually.  Will continue statin and monitor BP control.  Recent CT abdomen in April 2022 noted "non-aneurysmal abdominal aorta".

## 2020-05-12 NOTE — Progress Notes (Signed)
BP (!) 96/55   SpO2 94%    Subjective:    Patient ID: Nicholas [name], male    DOB: July 12, 1945, 75 y.o.   MRN: [id number]  HPI: Nicholas [name] is a 75 y.o. male  Chief Complaint  Patient presents with  . Hip Pain    Patient states he is doing well, but states he still has issues with pain in his hip.     . This visit was completed via video visit through MyChart due to the restrictions of the COVID-19 pandemic. All issues as above were discussed and addressed. Physical exam was done as above through visual confirmation on video through MyChart. If it was felt that the patient should be evaluated in the office, they were directed there. The patient verbally consented to this visit. . Location of the patient: home . Location of the provider: home . Those involved with this call:  . Provider: [name], DNP . CMA: Nicholas [name], CMA . Front Desk/Registration: Nicholas [name]  . Time spent on call: 21 minutes with patient face to face via video conference. More than 50% of this time was spent in counseling and coordination of care. 15 minutes total spent in review of patient's record and preparation of their chart.  . I verified patient identity using two factors (patient name and date of birth). Patient consents verbally to being seen via telemedicine visit today.    Wife present at bedside with patient.  HYPERTENSION / HYPERLIPIDEMIA Followed by cardiology and last saw yesterday.  Is scheduled for ablation on 05/17/20.Does endorse having occasional episodes over past year or so of dizziness when changing positions from sitting to standing -- he reports this is improving with medication changes. Does not wear compression hose, refuses to wear these.    Saw neurology on 03/23/20 -- showing polyneuropathy on testing -- continues Midodrine 10 MG TID for orthostatic hypotension and was started on Florinef 0.1 MG daily -- they are monitoring for autonomic neuropathy.  Had an  electromyography on 01/13/20 which noted generalized severe sensorimotor polyneuropathy.  Continues on Rosuvastatin, Eliquis, fish oil. Satisfied with current treatment?yes Duration of hypertension:chronic BP monitoring frequency:daily BP range:100-110/60 BP medication [name] effects:no Duration of hyperlipidemia:chronic Cholesterol medication [name] effects:no Cholesterol supplements: fish oil Medication compliance:good compliance Aspirin:no Recent stressors:no Recurrent headaches:no Visual changes:no Palpitations:no Dyspnea:no Chest pain:no Lower extremity edema:no Dizzy/lightheaded:occasional  IFG: Recent A1c 5.9% January 2022. Improved from previous 6.1%.  Polydipsia/polyuria: no Visual disturbance: no Chest pain: no Paresthesias: no   CHRONIC KIDNEY DISEASE Noted on cardiology labs last year with recent labs yesterday noting GFR 51 and CRT 1.43, BUN 22. CKD status: stable Medications renally dose: yes Previous renal evaluation: no Pneumovax:  Up to Date Influenza Vaccine:  Up to Date  DEPRESSION & CHRONIC PAIN Is on Effexor, Seroquel, and Ativan. Pt and his wife at bedside [facility] of risks of benzomedication use to include increased sedation, respiratory suppression, falls, extrapyramidal movements, dependence and cardiovascular events. Pt and his wifewould like to continue treatment as benefit determined to outweigh risk.Discussed with him that he is also on opioid therapy, he takes Ativan at night when he also takes his Norco and his Seroquel.Discussed risks with taking these three medications together at same time.  He has tried taking 1/2 tablet Ativan, but this does not work well.  Last Ativan fill on PDMP review 04/03/20 and last opioid fill 04/05/20 with recent UDS with pain management on 09/03/19. Saw Dr. Andree Elk on 04/05/20 last.  Helene Kelp  to have some mild hip pain after recent kidney stone, but recent urine improved and overall symptoms  have improved. Duration:stable Anxious mood:yes Excessive worrying:no Irritability:no Sweating:no Nausea:no Palpitations:no Hyperventilation:no Panic attacks:no Agoraphobia:no Obscessions/compulsions:no Depressed mood:no Depression screen [facility] 2/9 05/12/2020 04/30/2020 02/11/2020 11/10/2019 07/01/2019  Decreased Interest 0 0 1 1 0  Down, Depressed, Hopeless 0 0 1 1 0  PHQ - 2 Score 0 0 2 2 0  Altered sleeping 0 - 1 0 0  Tired, decreased energy 3 - '1 1 1  ' Change in appetite 0 - 0 1 0  Feeling bad or failure about yourself  0 - 0 1 0  Trouble concentrating 0 - 0 0 0  Moving slowly or fidgety/restless 0 - 0 1 0  Suicidal thoughts 0 - 0 0 0  PHQ-9 Score 3 - '4 6 1  ' Difficult doing work/chores - - Not difficult at all Somewhat difficult Not difficult at all  Some recent data might be hidden   GAD 7 : Generalized Anxiety Score 05/12/2020 02/11/2020 08/14/2018 07/02/2017  Nervous, Anxious, on Edge 0 0 0 1  Control/stop worrying '1 1 1 1  ' Worry too much - different things '1 1 2 2  ' Trouble relaxing 0 0 0 1  Restless 0 0 0 0  Easily annoyed or irritable 0 0 0 1  Afraid - awful might happen 0 0 1 2  Total GAD 7 Score '2 2 4 8  ' Anxiety Difficulty Not difficult at all Not difficult at all Not difficult at all Somewhat difficult   Relevant past medical, surgical, family and social history reviewed and updated as indicated. Interim medical history since our last visit reviewed. Allergies and medications reviewed and updated.  Review of Systems  Constitutional: Negative for activity change, appetite change, fatigue and fever.  Respiratory: Negative for cough, chest tightness, shortness of breath and wheezing.   Cardiovascular: Negative for chest pain, palpitations and leg swelling.  Endocrine: Negative for polydipsia, polyphagia and polyuria.  Musculoskeletal: Positive for arthralgias.  Neurological: Negative.   Psychiatric/Behavioral: Negative for decreased concentration,  self-injury, sleep disturbance and suicidal ideas. The patient is not nervous/anxious.     Per HPI unless specifically indicated above     Objective:    BP (!) 96/55   SpO2 94%   Wt Readings from Last 3 Encounters:  05/11/20 278 lb (126.1 kg)  04/30/20 280 lb (127 kg)  04/29/20 280 lb 6.4 oz (127.2 kg)    Physical Exam   Unable to view via video, camera not working on patient end, but could hear via [place] visit and patient could see provider.  Results for orders placed or performed in visit on 13/08/65  Basic Metabolic Panel (BMET)  Result Value Ref Range   Glucose 123 (H) 65 - 99 mg/dL   BUN 22 8 - 27 mg/dL   Creatinine, Ser 1.43 (H) 0.76 - 1.27 mg/dL   eGFR 51 (L) >59 mL/min/1.73   BUN/Creatinine Ratio 15 10 - 24   Sodium 142 134 - 144 mmol/L   Potassium 4.6 3.5 - 5.2 mmol/L   Chloride 100 96 - 106 mmol/L   CO2 23 20 - 29 mmol/L   Calcium 9.6 8.6 - 10.2 mg/dL  CBC w/Diff  Result Value Ref Range   WBC 6.5 3.4 - 10.8 x10E3/uL   RBC 5.06 4.14 - 5.80 x10E6/uL   Hemoglobin 15.5 13.0 - 17.7 g/dL   Hematocrit 46.5 37.5 - 51.0 %   MCV 92 79 - 97 fL  MCH 30.6 26.6 - 33.0 pg   MCHC 33.3 31.5 - 35.7 g/dL   RDW 12.9 11.6 - 15.4 %   Platelets 168 150 - 450 x10E3/uL   Neutrophils 60 Not Estab. %   Lymphs 24 Not Estab. %   Monocytes 10 Not Estab. %   Eos 4 Not Estab. %   Basos 1 Not Estab. %   Neutrophils Absolute 4.0 1.4 - 7.0 x10E3/uL   Lymphocytes Absolute 1.5 0.7 - 3.1 x10E3/uL   Monocytes Absolute 0.6 0.1 - 0.9 x10E3/uL   EOS (ABSOLUTE) 0.2 0.0 - 0.4 x10E3/uL   Basophils Absolute 0.1 0.0 - 0.2 x10E3/uL   Immature Granulocytes 1 Not Estab. %   Immature Grans (Abs) 0.0 0.0 - 0.1 x10E3/uL      Assessment & Plan:   Problem List Items Addressed This Visit      Cardiovascular and Mediastinum   HTN (hypertension) (Chronic)    Chronic, ongoing.  Followed by cardiology.  BP below goal at home.  With orthostatic BP presenting occasionally will continue current  regimen at this time and have recommended utilizing compression hose at home, agrees to try this.  Continue collaboration with cardiology + neurology and current medication regimen.  Labs up to date.  Recommend ensuring good fluid intake at home.  Return in 3 months.      PAF (paroxysmal atrial fibrillation) (HCC) (Chronic)    Chronic, stable, followed by cardiology.  Continue current medication regimen as prescribed by them.  Labs up to date and ablation upcoming on 05/17/20.      Abdominal aortic aneurysm (AAA) ([place]) (Chronic)    Noted on imaging on 11/22/19.  With recommendation to monitor annually.  Will continue statin and monitor BP control.  Recent CT abdomen in April 2022 noted "non-aneurysmal abdominal aorta".      Chronic orthostatic hypotension    Continue collaboration with cardiology + neurology and medication regimen as prescribed by them.  Recommend ensuring good hydration throughout daytime hours + use of compression hose at home (on during daytime and off during night), agrees to try them.      Complete heart block ([place])    Followed by cardiology.  Pacemaker checks by them.         Endocrine   IFG (impaired fasting glucose) (Chronic)    Noted on past labs -- A1c last visit downward trend to 5.9% and urine ALB 30.  Concern for T2DM in future and discussed with wife and patient.  Initiate medication as needed and continue to collaborate with CCM team.          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        Hematopoietic and Hemostatic   Other thrombophilia (HCC) (Chronic)    On Eliquis with A-fib.  Continue to monitor closely for bleeding or increased bruising.  CBC annually.        Other   Dyslipidemia (Chronic)    Chronic, ongoing.  Continue current medication regimen and adjust as needed. Lipid panel up to date.      Anxiety (Chronic)     Chronic, ongoing.  Discussed at length risk of benzo and opioid use in conjunction with each other.  Recommend he not take the two together at same hour during day or evening.  He tried to cut back to 1/2 tablet but was unsuccessful in past.  Continue to collaborate with CCM team on education.  Provided wife and him with copy of [facility] benzo risk information patient sheet in past.  They request 90 day supply, as previous PCP supplied this.  Are aware he does have to return every 3 months for refills.  UDS up to date with pain management in August 2021.  Refills sent.  Controlled substance contract next visit.      Chronic pain syndrome (Chronic)    Chronic, ongoing followed by pain clinic.  Discussed at length risk of benzo and opioid use in conjunction with each other.  Recommend he not take the two together at same hour during day or evening.        Long-term current use of benzodiazepine (Chronic)    Has been on Ativan for many years, with trial of cutting back unsuccessful.  At length discussions about risk of opioid and benzo use had with patient and wife by both PCP and CCM PharmD.  Continue these discussions.  Refer to anxiety plan.      Depression, recurrent ([place]) - Primary    Chronic, ongoing.  Denies SI/HI.  Continue current medication regimen and adjust as needed.  Would benefit from trial off Effexor and trial of SSRI, but refuses this.           I discussed the assessment and treatment plan with the patient. The patient was provided an opportunity to ask questions and all were answered. The patient agreed with the plan and demonstrated an understanding of the instructions.   The patient was advised to call back or seek an in-person evaluation if the symptoms worsen or if the condition fails to improve as anticipated.   I provided 21+ minutes of time during this encounter.  Follow up plan: Return in about 3 months (around 08/11/2020) for HTN/HLD, ANXIETY, PSA RECHECK --  controlled substance contract due.

## 2020-05-12 NOTE — Assessment & Plan Note (Signed)
Followed by cardiology.  Pacemaker checks by them.

## 2020-05-12 NOTE — Assessment & Plan Note (Signed)
Has been on Ativan for many years, with trial of cutting back unsuccessful.  At length discussions about risk of opioid and benzo use had with patient and wife by both PCP and CCM PharmD.  Continue these discussions.  Refer to anxiety plan. 

## 2020-05-12 NOTE — Assessment & Plan Note (Signed)
Chronic, ongoing followed by pain clinic.  Discussed at length risk of benzo and opioid use in conjunction with each other.  Recommend he not take the two together at same hour during day or evening.

## 2020-05-12 NOTE — Assessment & Plan Note (Signed)
Noted on past labs -- A1c last visit downward trend to 5.9% and urine ALB 30.  Concern for T2DM in future and discussed with wife and patient.  Initiate medication as needed and continue to collaborate with CCM team.

## 2020-05-12 NOTE — Assessment & Plan Note (Signed)
Chronic, ongoing.  Continue current medication regimen and adjust as needed.  Lipid panel up to date. 

## 2020-05-12 NOTE — Assessment & Plan Note (Signed)
On Eliquis with A-fib.  Continue to monitor closely for bleeding or increased bruising.  CBC annually. 

## 2020-05-12 NOTE — Patient Instructions (Signed)
Hip Pain The hip is the joint between the upper legs and the lower pelvis. The bones, cartilage, tendons, and muscles of your hip joint support your body and allow you to move around. Hip pain can range from a minor ache to severe pain in one or both of your hips. The pain may be felt on the inside of the hip joint near the groin, or on the outside near the buttocks and upper thigh. You may also have swelling or stiffness in your hip area. Follow these instructions at home: Managing pain, stiffness, and swelling  If directed, put ice on the painful area. To do this: ? Put ice in a plastic bag. ? Place a towel between your skin and the bag. ? Leave the ice on for 20 minutes, 2-3 times a day.  If directed, apply heat to the affected area as often as told by your health care provider. Use the heat source that your health care provider recommends, such as a moist heat pack or a heating pad. ? Place a towel between your skin and the heat source. ? Leave the heat on for 20-30 minutes. ? Remove the heat if your skin turns bright red. This is especially important if you are unable to feel pain, heat, or cold. You may have a greater risk of getting burned.      Activity  Do exercises as told by your health care provider.  Avoid activities that cause pain. General instructions  Take over-the-counter and prescription medicines only as told by your health care provider.  Keep a journal of your symptoms. Write down: ? How often you have hip pain. ? The location of your pain. ? What the pain feels like. ? What makes the pain worse.  Sleep with a pillow between your legs on your most comfortable side.  Keep all follow-up visits as told by your health care provider. This is important.   Contact a health care provider if:  You cannot put weight on your leg.  Your pain or swelling continues or gets worse after one week.  It gets harder to walk.  You have a fever. Get help right away  if:  You fall.  You have a sudden increase in pain and swelling in your hip.  Your hip is red or swollen or very tender to touch. Summary  Hip pain can range from a minor ache to severe pain in one or both of your hips.  The pain may be felt on the inside of the hip joint near the groin, or on the outside near the buttocks and upper thigh.  Avoid activities that cause pain.  Write down how often you have hip pain, the location of the pain, what makes it worse, and what it feels like. This information is not intended to replace advice given to you by your health care provider. Make sure you discuss any questions you have with your health care provider. Document Revised: 05/27/2018 Document Reviewed: 05/27/2018 Elsevier Patient Education  2021 [employer]  

## 2020-05-14 ENCOUNTER — Other Ambulatory Visit: Payer: Self-pay

## 2020-05-14 ENCOUNTER — Other Ambulatory Visit
Admission: RE | Admit: 2020-05-14 | Discharge: 2020-05-14 | Disposition: A | Discharge status: Home / Self Care | Payer: Medicare Other | Source: Ambulatory Visit | Attending: Cardiology | Admitting: Cardiology

## 2020-05-14 DIAGNOSIS — Z20822 Contact with and (suspected) exposure to covid-19: Secondary | ICD-10-CM | POA: Insufficient documentation

## 2020-05-14 DIAGNOSIS — Z01812 Encounter for preprocedural laboratory examination: Secondary | ICD-10-CM | POA: Diagnosis present

## 2020-05-14 LAB — SARS CORONAVIRUS 2 (TAT 6-24 HRS): SARS Coronavirus 2: NEGATIVE

## 2020-05-14 NOTE — Telephone Encounter (Signed)
Patient is scheduled for CPAP Titration on 06/29/20. Patient understands his titration study will be done at [facility] sleep lab. Patient understands he will receive a letter in a week or so detailing appointment, date, time, and location. Patient understands to call if he does not receive the letter  in a timely manner. Patient agrees with treatment and thanked me for call.

## 2020-05-16 NOTE — Anesthesia Preprocedure Evaluation (Addendum)
Anesthesia Evaluation  Patient identified by MRN, date of birth, ID band Patient awake    Reviewed: Allergy & Precautions, NPO status , Patient's Chart, lab work & pertinent test results  History of Anesthesia Complications Negative for: history of anesthetic complications  Airway Mallampati: II  TM Distance: >3 FB Neck ROM: Full    Dental  (+) Missing,    Pulmonary sleep apnea ,    Pulmonary exam normal        Cardiovascular hypertension, Pt. 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  Reproductive/Obstetrics negative OB ROS                            Anesthesia Physical Anesthesia Plan  ASA: III  Anesthesia Plan: General   Post-op Pain Management:    Induction: Intravenous  PONV Risk Score and Plan: 2 and Treatment may vary due to age or medical condition, Ondansetron and Dexamethasone  Airway Management Planned: Oral ETT  Additional Equipment: None  Intra-op Plan:   Post-operative Plan: Extubation in OR  Informed Consent: I have reviewed the patients History and Physical, chart, labs and discussed the procedure including the risks, benefits and alternatives for the proposed anesthesia with the patient or authorized representative who has indicated his/her understanding and  acceptance.     Dental advisory given  Plan Discussed with: CRNA  Anesthesia Plan Comments:        Anesthesia Quick Evaluation

## 2020-05-17 ENCOUNTER — Ambulatory Visit (HOSPITAL_COMMUNITY): Payer: Medicare Other | Admitting: Certified Registered Nurse Anesthetist

## 2020-05-17 ENCOUNTER — Ambulatory Visit (HOSPITAL_COMMUNITY)
Admission: RE | Disposition: A | Discharge status: Home / Self Care | Payer: Self-pay | Source: Ambulatory Visit | Attending: Cardiology

## 2020-05-17 ENCOUNTER — Other Ambulatory Visit: Payer: Self-pay

## 2020-05-17 ENCOUNTER — Observation Stay (HOSPITAL_COMMUNITY)
Admission: RE | Admit: 2020-05-17 | Discharge: 2020-05-18 | Disposition: A | Discharge status: Home / Self Care | Payer: Medicare Other | Source: Ambulatory Visit | Attending: Cardiology | Admitting: Cardiology

## 2020-05-17 ENCOUNTER — Encounter (HOSPITAL_COMMUNITY): Payer: Self-pay | Admitting: Cardiology

## 2020-05-17 DIAGNOSIS — Z950 Presence of cardiac pacemaker: Secondary | ICD-10-CM | POA: Diagnosis not present

## 2020-05-17 DIAGNOSIS — Z9889 Other specified postprocedural states: Secondary | ICD-10-CM

## 2020-05-17 DIAGNOSIS — Z79899 Other long term (current) drug therapy: Secondary | ICD-10-CM | POA: Diagnosis not present

## 2020-05-17 DIAGNOSIS — I10 Essential (primary) hypertension: Secondary | ICD-10-CM | POA: Insufficient documentation

## 2020-05-17 DIAGNOSIS — Z7901 Long term (current) use of anticoagulants: Secondary | ICD-10-CM | POA: Diagnosis not present

## 2020-05-17 DIAGNOSIS — Z96643 Presence of artificial hip joint, bilateral: Secondary | ICD-10-CM | POA: Diagnosis not present

## 2020-05-17 DIAGNOSIS — I480 Paroxysmal atrial fibrillation: Principal | ICD-10-CM | POA: Insufficient documentation

## 2020-05-17 DIAGNOSIS — I4891 Unspecified atrial fibrillation: Secondary | ICD-10-CM | POA: Diagnosis present

## 2020-05-17 DIAGNOSIS — Z96652 Presence of left artificial knee joint: Secondary | ICD-10-CM | POA: Insufficient documentation

## 2020-05-17 DIAGNOSIS — I442 Atrioventricular block, complete: Secondary | ICD-10-CM | POA: Diagnosis not present

## 2020-05-17 DIAGNOSIS — Z8679 Personal history of other diseases of the circulatory system: Secondary | ICD-10-CM

## 2020-05-17 HISTORY — PX: ATRIAL FIBRILLATION ABLATION: EP1191

## 2020-05-17 LAB — POCT ACTIVATED CLOTTING TIME
Activated Clotting Time: 416 seconds
Activated Clotting Time: 321 seconds

## 2020-05-17 SURGERY — ATRIAL FIBRILLATION ABLATION
Anesthesia: General

## 2020-05-17 MED ORDER — ROPINIROLE HCL 0.5 MG PO TABS
0.2500 mg | ORAL_TABLET | Freq: Three times a day (TID) | ORAL | Status: DC
  Administered 2020-05-17 – 2020-05-18 (×2): 0.25 mg via ORAL
  Filled 2020-05-17 (×2): qty 1

## 2020-05-17 MED ORDER — VENLAFAXINE HCL ER 75 MG PO CP24
225.0000 mg | ORAL_CAPSULE | Freq: Every day | ORAL | Status: DC
  Administered 2020-05-18: 225 mg via ORAL
  Filled 2020-05-17: qty 1

## 2020-05-17 MED ORDER — GABAPENTIN 600 MG PO TABS
600.0000 mg | ORAL_TABLET | Freq: Every day | ORAL | Status: DC
  Administered 2020-05-17: 600 mg via ORAL
  Filled 2020-05-17: qty 1

## 2020-05-17 MED ORDER — MIDODRINE HCL 5 MG PO TABS
10.0000 mg | ORAL_TABLET | Freq: Three times a day (TID) | ORAL | Status: DC
  Administered 2020-05-17 – 2020-05-18 (×2): 10 mg via ORAL
  Filled 2020-05-17 (×2): qty 2

## 2020-05-17 MED ORDER — HEPARIN (PORCINE) IN NACL 1000-0.9 UT/500ML-% IV SOLN
INTRAVENOUS | Status: DC | PRN
  Administered 2020-05-17 (×2): 500 mL

## 2020-05-17 MED ORDER — ONDANSETRON HCL 4 MG/2ML IJ SOLN
INTRAMUSCULAR | Status: DC | PRN
  Administered 2020-05-17: 4 mg via INTRAVENOUS

## 2020-05-17 MED ORDER — PROTAMINE SULFATE 10 MG/ML IV SOLN
INTRAVENOUS | Status: DC | PRN
  Administered 2020-05-17: 30 mg via INTRAVENOUS

## 2020-05-17 MED ORDER — CEFAZOLIN SODIUM-DEXTROSE 2-3 GM-%(50ML) IV SOLR: INTRAVENOUS | Status: DC | PRN

## 2020-05-17 MED ORDER — ISOPROTERENOL HCL 0.2 MG/ML IJ SOLN
INTRAMUSCULAR | Status: AC
  Filled 2020-05-17: qty 5

## 2020-05-17 MED ORDER — SUGAMMADEX SODIUM 200 MG/2ML IV SOLN
INTRAVENOUS | Status: DC | PRN
  Administered 2020-05-17: 250 mg via INTRAVENOUS

## 2020-05-17 MED ORDER — METHOCARBAMOL 500 MG PO TABS: 500.0000 mg | ORAL_TABLET | Freq: Two times a day (BID) | ORAL | Status: DC | PRN

## 2020-05-17 MED ORDER — APIXABAN 5 MG PO TABS
5.0000 mg | ORAL_TABLET | Freq: Two times a day (BID) | ORAL | Status: DC
  Administered 2020-05-18 (×2): 5 mg via ORAL
  Filled 2020-05-17 (×2): qty 1

## 2020-05-17 MED ORDER — HEPARIN SODIUM (PORCINE) 1000 UNIT/ML IJ SOLN
INTRAMUSCULAR | Status: DC | PRN
  Administered 2020-05-17: 1000 [IU] via INTRAVENOUS
  Administered 2020-05-17: 18000 [IU] via INTRAVENOUS

## 2020-05-17 MED ORDER — CEFAZOLIN IN SODIUM CHLORIDE 3-0.9 GM/100ML-% IV SOLN
3.0000 g | INTRAVENOUS | Status: AC
  Administered 2020-05-17: 3 g via INTRAVENOUS
  Filled 2020-05-17 (×2): qty 100

## 2020-05-17 MED ORDER — HEPARIN SODIUM (PORCINE) 1000 UNIT/ML IJ SOLN
INTRAMUSCULAR | Status: AC
  Filled 2020-05-17: qty 1

## 2020-05-17 MED ORDER — FENTANYL CITRATE (PF) 250 MCG/5ML IJ SOLN
INTRAMUSCULAR | Status: DC | PRN
  Administered 2020-05-17: 50 ug via INTRAVENOUS

## 2020-05-17 MED ORDER — ROSUVASTATIN CALCIUM 20 MG PO TABS
40.0000 mg | ORAL_TABLET | Freq: Every day | ORAL | Status: DC
  Administered 2020-05-17 – 2020-05-18 (×2): 40 mg via ORAL
  Filled 2020-05-17 (×3): qty 2

## 2020-05-17 MED ORDER — HYDROCODONE-ACETAMINOPHEN 5-325 MG PO TABS: 1.0000 | ORAL_TABLET | Freq: Two times a day (BID) | ORAL | Status: DC | PRN

## 2020-05-17 MED ORDER — DEXAMETHASONE SODIUM PHOSPHATE 10 MG/ML IJ SOLN
INTRAMUSCULAR | Status: DC | PRN
  Administered 2020-05-17: 5 mg via INTRAVENOUS

## 2020-05-17 MED ORDER — ROCURONIUM BROMIDE 10 MG/ML (PF) SYRINGE
PREFILLED_SYRINGE | INTRAVENOUS | Status: DC | PRN
  Administered 2020-05-17: 70 mg via INTRAVENOUS

## 2020-05-17 MED ORDER — ONDANSETRON HCL 4 MG/2ML IJ SOLN: 4.0000 mg | Freq: Four times a day (QID) | INTRAMUSCULAR | Status: DC | PRN

## 2020-05-17 MED ORDER — SODIUM CHLORIDE 0.9% FLUSH: 3.0000 mL | INTRAVENOUS | Status: DC | PRN

## 2020-05-17 MED ORDER — PHENYLEPHRINE HCL-NACL 10-0.9 MG/250ML-% IV SOLN
INTRAVENOUS | Status: DC | PRN
  Administered 2020-05-17: 40 ug/min via INTRAVENOUS

## 2020-05-17 MED ORDER — ISOPROTERENOL HCL 0.2 MG/ML IJ SOLN
INTRAVENOUS | Status: DC | PRN
  Administered 2020-05-17: 2 ug/min via INTRAVENOUS

## 2020-05-17 MED ORDER — LIDOCAINE-EPINEPHRINE 1 %-1:100000 IJ SOLN
INTRAMUSCULAR | Status: AC
  Filled 2020-05-17: qty 1

## 2020-05-17 MED ORDER — QUETIAPINE FUMARATE ER 50 MG PO TB24
150.0000 mg | ORAL_TABLET | Freq: Every day | ORAL | Status: DC
Start: 2020-05-17 — End: 2020-05-18
  Administered 2020-05-17: 150 mg via ORAL
  Filled 2020-05-17 (×2): qty 3

## 2020-05-17 MED ORDER — LIDOCAINE 2% (20 MG/ML) 5 ML SYRINGE
INTRAMUSCULAR | Status: DC | PRN
  Administered 2020-05-17: 100 mg via INTRAVENOUS

## 2020-05-17 MED ORDER — DARIFENACIN HYDROBROMIDE ER 15 MG PO TB24
15.0000 mg | ORAL_TABLET | Freq: Every day | ORAL | Status: DC
  Administered 2020-05-18: 15 mg via ORAL
  Filled 2020-05-17: qty 1

## 2020-05-17 MED ORDER — FLUDROCORTISONE ACETATE 0.1 MG PO TABS
0.1000 mg | ORAL_TABLET | Freq: Every day | ORAL | Status: DC
  Administered 2020-05-17 – 2020-05-18 (×2): 0.1 mg via ORAL
  Filled 2020-05-17 (×2): qty 1

## 2020-05-17 MED ORDER — ACETAMINOPHEN 500 MG PO TABS
1000.0000 mg | ORAL_TABLET | Freq: Once | ORAL | Status: AC
  Administered 2020-05-17: 1000 mg via ORAL
  Filled 2020-05-17 (×2): qty 2

## 2020-05-17 MED ORDER — SODIUM CHLORIDE 0.9 % IV SOLN: INTRAVENOUS | Status: DC

## 2020-05-17 MED ORDER — ACETAMINOPHEN 325 MG PO TABS
650.0000 mg | ORAL_TABLET | ORAL | Status: DC | PRN
  Administered 2020-05-17: 650 mg via ORAL
  Filled 2020-05-17 (×3): qty 2

## 2020-05-17 MED ORDER — APIXABAN 5 MG PO TABS
5.0000 mg | ORAL_TABLET | Freq: Two times a day (BID) | ORAL | Status: DC
  Administered 2020-05-17: 5 mg via ORAL
  Filled 2020-05-17: qty 1

## 2020-05-17 MED ORDER — LORAZEPAM 1 MG PO TABS
1.0000 mg | ORAL_TABLET | Freq: Every day | ORAL | Status: DC
  Administered 2020-05-17: 1 mg via ORAL
  Filled 2020-05-17: qty 1

## 2020-05-17 MED ORDER — HEPARIN SODIUM (PORCINE) 1000 UNIT/ML IJ SOLN
INTRAMUSCULAR | Status: DC | PRN
  Administered 2020-05-17: 2000 [IU] via INTRAVENOUS
  Administered 2020-05-17: 18000 [IU] via INTRAVENOUS

## 2020-05-17 MED ORDER — PROPOFOL 10 MG/ML IV BOLUS
INTRAVENOUS | Status: DC | PRN
  Administered 2020-05-17: 160 mg via INTRAVENOUS

## 2020-05-17 MED ORDER — SODIUM CHLORIDE 0.9 % IV SOLN: 250.0000 mL | INTRAVENOUS | Status: DC | PRN

## 2020-05-17 MED ORDER — PANTOPRAZOLE SODIUM 40 MG PO TBEC
40.0000 mg | DELAYED_RELEASE_TABLET | Freq: Every day | ORAL | Status: DC
  Administered 2020-05-17 – 2020-05-18 (×2): 40 mg via ORAL
  Filled 2020-05-17 (×3): qty 1

## 2020-05-17 MED ORDER — SODIUM CHLORIDE 0.9% FLUSH
3.0000 mL | Freq: Two times a day (BID) | INTRAVENOUS | Status: DC
  Administered 2020-05-17: 3 mL via INTRAVENOUS

## 2020-05-17 SURGICAL SUPPLY — 22 items
BLANKET WARM UNDERBOD FULL ACC (MISCELLANEOUS) ×2 IMPLANT
CATH 8FR REPROCESSED SOUNDSTAR (CATHETERS) ×2 IMPLANT
CATH 8FR SOUNDSTAR REPROCESSED (CATHETERS) IMPLANT
CATH MAPPNG PENTARAY F 2-6-2MM (CATHETERS) IMPLANT
CATH S CIRCA THERM PROBE 10F (CATHETERS) ×1 IMPLANT
CATH SMTCH THERMOCOOL SF DF (CATHETERS) ×1 IMPLANT
CATH WEBSTER BI DIR CS D-F CRV (CATHETERS) ×1 IMPLANT
CLOSURE PERCLOSE PROSTYLE (VASCULAR PRODUCTS) ×3 IMPLANT
COVER SWIFTLINK CONNECTOR (BAG) ×2 IMPLANT
FEM STOP ARCH (HEMOSTASIS) ×2
MAT PREVALON FULL STRYKER (MISCELLANEOUS) ×1 IMPLANT
PACK EP LATEX FREE (CUSTOM PROCEDURE TRAY) ×2
PACK EP LF (CUSTOM PROCEDURE TRAY) ×1 IMPLANT
PAD PRO RADIOLUCENT 2001M-C (PAD) ×2 IMPLANT
PATCH CARTO3 (PAD) ×1 IMPLANT
PENTARAY F 2-6-2MM (CATHETERS) ×2
SHEATH BAYLIS TRANSSEPTAL 98CM (NEEDLE) ×1 IMPLANT
SHEATH CARTO VIZIGO SM CVD (SHEATH) ×1 IMPLANT
SHEATH PINNACLE 8F 10CM (SHEATH) ×2 IMPLANT
SHEATH PINNACLE 9F 10CM (SHEATH) ×1 IMPLANT
SYSTEM COMPRESSION FEMOSTOP (HEMOSTASIS) IMPLANT
TUBING SMART ABLATE COOLFLOW (TUBING) ×1 IMPLANT

## 2020-05-17 NOTE — Discharge Summary (Signed)
ELECTROPHYSIOLOGY PROCEDURE DISCHARGE SUMMARY    Patient ID: Nicholas [name],  MRN: [id number], DOB/AGE: 1945-02-09 75 y.o.  Admit date: 05/17/2020 Discharge date: 05/18/20  Primary Care Physician: Venita Lick, NP Electrophysiologist: Dr. Caryl Comes  Primary Discharge Diagnosis:  1. paroxysmal Atrial fibrillation      CHA2DS2Vasc is 5, on Eliquis, appropriately dosed  Secondary Discharge Diagnosis:  1. CHB w/PPM 2. HTN 3. H/o DVT/PE   Procedures This Admission:  1.  Electrophysiology study and radiofrequency catheter ablation on 4/25/22by Dr Quentin Ore   This study demonstrated  CONCLUSIONS: 1. Successful PVI 2. Intracardiac echo reveals normal LV function, 4 distinct PVs and trivial pericardial effusion 3. No early apparent complications. 4. Continue protonix at 40mg  PO daily for the next 45 days, then decrease back to the 20mg  PO daily dose.  Brief HPI: Nicholas [name] is a 75 y.o. male with a history of paroxysmal atrial fibrillation.  They have failed medical therapy with flecainide and propafenone. Risks, benefits, and alternatives to catheter ablation of atrial fibrillation were reviewed with the patient who wished to proceed.     Hospital Course:  The patient was admitted and underwent EPS/RFCA of atrial fibrillation with details as outlined above.  Post procedure he had persistent R groin bleeding despite length manual pressure as well as fem stop.  Dr. Quentin Ore placed a stitch at the site and bleeding was stopped.  They were monitored on telemetry overnight which demonstrated SR/V pacing and AV pacing.  Dr. Quentin Ore removed the stitch this morning without difficulty, site was stable without hematoma, bleeding.  The patient was observed for a couple hours has been out of bed and ambulated without difficulty and without bleeding.  The patient feels well today, denies CP, SOB, mild R groin ache when up and about, no resting pain, b/l groin sites are soft and nontender, no hematoma.   He was examined by Dr. Quentin Ore and considered to be stable for discharge if groin stable after ambulation.  Wound care and restrictions were reviewed with the patient.  The patient will be seen back by the Afib clinic in 4 weeks and Dr Quentin Ore in 12 weeks for post ablation follow up.   He is on Protonix 20mg  daily, I have asked him to double dose to 40mg  daily for 45 days then resume 20mg    Physical Exam: Vitals:   05/17/20 2000 05/17/20 2027 05/17/20 2035 05/18/20 0607  BP:  (!) 176/83 (!) 152/80 (!) 143/74  Pulse: 89 92  73  Resp: 18 20 18 18   Temp:  99.4 F (37.4 C)  97.9 F (36.6 C)  TempSrc:  Oral  Oral  SpO2: 93% 96% 94% 95%  Weight:    125.6 kg  Height:        GEN- The patient is well appearing, alert and oriented x 3 today.   HEENT: normocephalic, atraumatic; sclera clear, conjunctiva pink; hearing intact; oropharynx clear; neck supple  Lungs- CTA b/l, normal work of breathing.  No wheezes, rales, rhonchi Heart- RRR, no murmurs, rubs or gallops  GI- soft, non-tender, non-distended, bowel sounds present  Extremities- no clubbing, cyanosis, or edema; DP/PT/radial pulses 2+ bilaterally, b/l groins are without hematoma/bruit MS- no significant deformity or atrophy Skin- warm and dry, no rash or lesion Psych- euthymic mood, full affect Neuro- strength and sensation are intact   Labs:   Lab Results  Component Value Date   WBC 6.5 05/11/2020   HGB 15.5 05/11/2020   HCT 46.5 05/11/2020  MCV 92 05/11/2020   PLT 168 05/11/2020    No results for input(s): NA, K, CL, CO2, BUN, CREATININE, CALCIUM, PROT, BILITOT, ALKPHOS, ALT, AST, GLUCOSE in the last 168 hours.  Invalid input(s): LABALBU   Discharge Medications:  Allergies as of 05/18/2020   No Known Allergies     Medication List    TAKE these medications   acetaminophen 650 MG CR tablet Commonly known as: TYLENOL Take 650 mg by mouth every 8 (eight) hours as needed for pain.   apixaban 5 MG Tabs  tablet Commonly known as: ELIQUIS Take 5 mg by mouth 2 (two) times daily.   Cranberry 500 MG Caps Take 500 mg by mouth daily.   diphenhydramine-acetaminophen 25-500 MG Tabs tablet Commonly known as: TYLENOL PM Take 1 tablet by mouth at bedtime as needed (sleep).   Fish Oil 1200 MG Caps Take 1,200 mg by mouth daily.   fludrocortisone 0.1 MG tablet Commonly known as: FLORINEF Take 0.1 mg by mouth daily.   gabapentin 600 MG tablet Commonly known as: Neurontin Take 1 tablet (600 mg total) by mouth at bedtime.   HYDROcodone-acetaminophen 5-325 MG tablet Commonly known as: NORCO/VICODIN Take 1 tablet by mouth 2 (two) times daily as needed for moderate pain or severe pain. What changed: when to take this   hyoscyamine 0.125 MG tablet Commonly known as: LEVSIN Take 0.125 mg by mouth every 6 (six) hours as needed for bladder spasms or cramping.   IRON 27 PO Take 27 mg by mouth daily.   LORazepam 1 MG tablet Commonly known as: ATIVAN Take 1 tablet (1 mg total) by mouth at bedtime.   Magnesium 400 MG Tabs Take 400 mg by mouth daily.   Melatonin 10 MG Tabs Take 10 mg by mouth at bedtime as needed (Sleep).   methocarbamol 500 MG tablet Commonly known as: Robaxin Take 1 tablet (500 mg total) by mouth 2 (two) times daily as needed for muscle spasms.   midodrine 10 MG tablet Commonly known as: PROAMATINE Take 1 tablet (10 mg total) by mouth 3 (three) times daily.   multivitamin with minerals Tabs tablet Take 1 tablet by mouth daily. Centrum Silver   nystatin powder Commonly known as: MYCOSTATIN/NYSTOP Apply 1 application topically 3 (three) times daily. What changed: when to take this   oxymetazoline 0.05 % nasal spray Commonly known as: AFRIN Place 2 sprays into both nostrils at bedtime.   pantoprazole 20 MG tablet Commonly known as: Protonix Take 1 tablet (20 mg total) by mouth daily. Notes to patient: Take 2 tablets (40mg ) daily for 45 days then resume your usual  dose of 20mg  daily   potassium chloride SA 20 MEQ tablet Commonly known as: KLOR-CON Take 2 tablets (40 meq) x 1 dose as directed What changed:   how much to take  how to take this  when to take this  reasons to take this  additional instructions   QUEtiapine Fumarate 150 MG 24 hr tablet Commonly known as: SEROQUEL XR TAKE 1 TABLET (150 MG TOTAL) BY MOUTH AT BEDTIME.   rOPINIRole 0.25 MG tablet Commonly known as: REQUIP Take 0.25 mg by mouth 3 (three) times daily.   rosuvastatin 40 MG tablet Commonly known as: CRESTOR Take 1 tablet (40 mg total) by mouth daily.   solifenacin 10 MG tablet Commonly known as: VESIcare Take 1 tablet (10 mg total) by mouth daily.   torsemide 20 MG tablet Commonly known as: DEMADEX Take 40 mg by mouth daily as needed (  fluid retention).   triamcinolone cream 0.1 % Commonly known as: KENALOG Apply 1 application topically 2 (two) times daily as needed (skin irritation).   venlafaxine XR 75 MG 24 hr capsule Commonly known as: Effexor XR Take 3 capsules (225 mg total) by mouth daily.   vitamin C 1000 MG tablet Take 1,000 mg by mouth daily.       Disposition: Home Discharge Instructions    Diet - low sodium heart healthy   Complete by: As directed    Increase activity slowly   Complete by: As directed       Follow-up Information    [facility] [place].   Specialty: Cardiology Why: 06/14/20 @ 10:00AM, with R. Marlene Lard, [place] Contact information: [address] [id number] mc [place] [phone]       Vickie Epley, MD Follow up.   Specialties: Cardiology, Radiology Why: 08/18/20 @ 10:00AM Contact information: [place] [address] [phone]               Duration of Discharge Encounter: Greater than 30 minutes including physician time.  Venetia Night, PA-C 05/18/2020 10:04 AM

## 2020-05-17 NOTE — Progress Notes (Signed)
6E called and updated

## 2020-05-17 NOTE — Progress Notes (Signed)
Report given to Judson Roch, RN on 6E.

## 2020-05-17 NOTE — Progress Notes (Signed)
House coverage called states the bed is being cleaned.

## 2020-05-17 NOTE — Progress Notes (Signed)
SITE AREA: right groin  SITE PRIOR TO REMOVAL:  LEVEL 0  PRESSURE APPLIED FOR: see progress notes  MANUAL: yes, femstop  PATIENT STATUS DURING PULL: wnl  POST PULL SITE:  LEVEL 0  POST PULL INSTRUCTIONS GIVEN: yes  POST PULL PULSES PRESENT: yes, see flowsheet  DRESSING APPLIED: gauze with tape for pressure drsg  BEDREST BEGINS @ 1330  COMMENTS:

## 2020-05-17 NOTE — Progress Notes (Addendum)
RN continues holding manual pressure to right groin, Aaron Edelman, supervisor to bedside with femstop, after speaking Dr. Quentin Ore, femstop placed to right groin by Aaron Edelman and inflated to 33mmHg, left pedal pulse palpable at +2, right pedal pulse dopplerable, safety maintained

## 2020-05-17 NOTE — Progress Notes (Signed)
Ambulated in hallway and to the bathroom to void tol well no bleeding noted before or after ambulation.  

## 2020-05-17 NOTE — Progress Notes (Signed)
Pt noted to have large amount of sanguinous drainage to right groin, dressing removed, clot noted, manual pressure held for approximately 20 minutes, oozing continues, Aaron Edelman, supervisor called to bedside, informed to hold continuous pressure for another 20 minutes, dressing to left groin checked and changed by Jackelyn Poling, Therapist, sports, safety maintained, see flowsheet for VS

## 2020-05-17 NOTE — Anesthesia Postprocedure Evaluation (Signed)
Anesthesia Post Note  Patient: Nicholas [name]  Procedure(s) Performed: ATRIAL FIBRILLATION ABLATION (N/A )     Patient location during evaluation: PACU Anesthesia Type: General Level of consciousness: awake and alert and oriented Pain management: pain level controlled Vital Signs Assessment: post-procedure vital signs reviewed and stable Respiratory status: spontaneous breathing, nonlabored ventilation and respiratory function stable Cardiovascular status: blood pressure returned to baseline Postop Assessment: no apparent nausea or vomiting Anesthetic complications: no   No complications documented.  Last Vitals:  Vitals:   05/17/20 1000 05/17/20 1005  BP: (!) 148/68 (!) 141/57  Pulse: 76 73  Resp: (!) 23 17  Temp: 37.2 C   SpO2: 94% 93%    Last Pain:  Vitals:   05/17/20 1000  TempSrc: Temporal  PainSc: 0-No pain                 Brennan Bailey

## 2020-05-17 NOTE — Interval H&P Note (Signed)
History and Physical Interval Note:  05/17/2020 7:12 AM  Nicholas [name]  has presented today for surgery, with the diagnosis of afib.  The various methods of treatment have been discussed with the patient and family. After consideration of risks, benefits and other options for treatment, the patient has consented to  Procedure(s): ATRIAL FIBRILLATION ABLATION (N/A) as a surgical intervention.  The patient's history has been reviewed, patient examined, no change in status, stable for surgery.  I have reviewed the patient's chart and labs.  Questions were answered to the patient's satisfaction.     Juliyah Mergen T Anajulia Leyendecker

## 2020-05-17 NOTE — Anesthesia Procedure Notes (Signed)
Procedure Name: Intubation Date/Time: 05/17/2020 7:36 AM Performed by: Reece Agar, CRNA Pre-anesthesia Checklist: Patient identified, Emergency Drugs available, Suction available and Patient being monitored Patient Re-evaluated:Patient Re-evaluated prior to induction Oxygen Delivery Method: Circle System Utilized Preoxygenation: Pre-oxygenation with 100% oxygen Induction Type: IV induction Ventilation: Two handed mask ventilation required Laryngoscope Size: Mac and 4 Grade View: Grade I Tube type: Oral Tube size: 7.5 mm Number of attempts: 1 Airway Equipment and Method: Stylet Placement Confirmation: ETT inserted through vocal cords under direct vision,  positive ETCO2 and breath sounds checked- equal and bilateral Secured at: 23 cm Tube secured with: Tape Dental Injury: Teeth and Oropharynx as per pre-operative assessment

## 2020-05-17 NOTE — Progress Notes (Signed)
Dr. Quentin Ore to bedside, femstop remains in place, (unsuccessful removal, bleeding noted), femstop removed, manual pressure held to right groin per Freida Busman and [place], RNs, [place], [place] at bedside, suture placed by Dr. Quentin Ore after area numbed with lidocaine, tolerated well by patient, see flowsheets for VS, safety maintained, per Dr. Quentin Ore ok for pt to go to short stay

## 2020-05-17 NOTE — Progress Notes (Signed)
6[facility] called states that the bed is still not clean. House coverage called and asked if they could check on this due to our department closes. States she will check on this.

## 2020-05-17 NOTE — Progress Notes (Addendum)
Femstop remains in place, bleeding noted, Aaron Edelman, supervisor called to bedside, femstop adjusted and inflated to 20mmHg, pulses remain unchanged, safety maintained, peri care given, Dr Quentin Ore to bedside at [facility]

## 2020-05-17 NOTE — Discharge Instructions (Signed)
Post procedure care instructions No driving for 4 days. No lifting over 5 lbs for 1 week. No vigorous or sexual activity for 1 week. You may return to work/your usual activities on 05/24/20. Keep procedure site clean & dry. If you notice increased pain, swelling, bleeding or pus, call/return!  You may shower after 24 hours, but no soaking in baths/hot tubs/pools for 1 week.   You have an appointment set up with the [facility].  Multiple studies have shown that being followed by a dedicated atrial fibrillation clinic in addition to the standard care you receive from your other physicians improves health. We believe that enrollment in the atrial fibrillation clinic will allow [place] to better care for you.   The phone number to the [facility] is [phone]. The clinic is staffed Monday through Friday from 8:30am to 5pm.  Parking Directions: The clinic is located in the Heart and Vascular Building connected to [facility]. 1)From [address] turn on to [address] and go to the 3rd entrance  (Heart and Vascular entrance) on the right. 2)Look to the right for Heart &Vascular Parking Garage. 3)A code for the entrance is required, for May is 3211.   4)Take the elevators to the 1st floor. Registration is in the room with the glass walls at the end of the hallway.  If you have any trouble parking or locating the clinic, please don't hesitate to call [phone].  Cardiac Ablation, Care After  This sheet gives you information about how to care for yourself after your procedure. Your health care provider may also give you more specific instructions. If you have problems or questions, contact your health care provider. What can I expect after the procedure? After the procedure, it is common to have:  Bruising around your puncture site.  Tenderness around your puncture site.  Skipped heartbeats.  Tiredness (fatigue).  Follow these instructions at  home: Puncture site care   Follow instructions from your health care provider about how to take care of your puncture site. Make sure you: ? If present, leave stitches (sutures), skin glue, or adhesive strips in place. These skin closures may need to stay in place for up to 2 weeks. If adhesive strip edges start to loosen and curl up, you may trim the loose edges. Do not remove adhesive strips completely unless your health care provider tells you to do that. ? If a large square bandage is present, this may be removed 24 hours after surgery.   Check your puncture site every day for signs of infection. Check for: ? Redness, swelling, or pain. ? Fluid or blood. If your puncture site starts to bleed, lie down on your back, apply firm pressure to the area, and contact your health care provider. ? Warmth. ? Pus or a bad smell. Driving  Do not drive for at least 4 days after your procedure or however long your health care provider recommends. (Do not resume driving if you have previously been instructed not to drive for other health reasons.)  Do not drive or use heavy machinery while taking prescription pain medicine. Activity  Avoid activities that take a lot of effort for at least 7 days after your procedure.  Do not lift anything that is heavier than 5 lb (4.5 kg) for one week.   No sexual activity for 1 week.   Return to your normal activities as told by your health care provider. Ask your health care provider what activities are safe  for you. General instructions  Take over-the-counter and prescription medicines only as told by your health care provider.  Do not use any products that contain nicotine or tobacco, such as cigarettes and e-cigarettes. If you need help quitting, ask your health care provider.  You may shower after 24 hours, but Do not take baths, swim, or use a hot tub for 1 week.   Do not drink alcohol for 24 hours after your procedure.  Keep all follow-up visits as  told by your health care provider. This is important. Contact a health care provider if:  You have redness, mild swelling, or pain around your puncture site.  You have fluid or blood coming from your puncture site that stops after applying firm pressure to the area.  Your puncture site feels warm to the touch.  You have pus or a bad smell coming from your puncture site.  You have a fever.  You have chest pain or discomfort that spreads to your neck, jaw, or arm.  You are sweating a lot.  You feel nauseous.  You have a fast or irregular heartbeat.  You have shortness of breath.  You are dizzy or light-headed and feel the need to lie down.  You have pain or numbness in the arm or leg closest to your puncture site. Get help right away if:  Your puncture site suddenly swells.  Your puncture site is bleeding and the bleeding does not stop after applying firm pressure to the area. These symptoms may represent a serious problem that is an emergency. Do not wait to see if the symptoms will go away. Get medical help right away. Call your local emergency services (911 in the [place] Do not drive yourself to the hospital. Summary  After the procedure, it is normal to have bruising and tenderness at the puncture site in your groin, neck, or forearm.  Check your puncture site every day for signs of infection.  Get help right away if your puncture site is bleeding and the bleeding does not stop after applying firm pressure to the area. This is a medical emergency. This information is not intended to replace advice given to you by your health care provider. Make sure you discuss any questions you have with your health care provider.

## 2020-05-17 NOTE — Transfer of Care (Signed)
Immediate Anesthesia Transfer of Care Note  Patient: Nicholas [name]  Procedure(s) Performed: ATRIAL FIBRILLATION ABLATION (N/A )  Patient Location: PACU  Anesthesia Type:General  Level of Consciousness: awake and alert   Airway & Oxygen Therapy: Patient Spontanous Breathing and Patient connected to nasal cannula oxygen  Post-op Assessment: Report given to RN and Post -op Vital signs reviewed and stable  Post vital signs: Reviewed and stable  Last Vitals:  Vitals Value Taken Time  BP 151/59 05/17/20 0932  Temp    Pulse 80 05/17/20 0936  Resp 19 05/17/20 0936  SpO2 93 % 05/17/20 0936  Vitals shown include unvalidated device data.  Last Pain:  Vitals:   05/17/20 0604  TempSrc:   PainSc: 0-No pain      Patients Stated Pain Goal: 3 (85/27/78 2423)  Complications: No complications documented.

## 2020-05-18 ENCOUNTER — Encounter (HOSPITAL_COMMUNITY): Payer: Self-pay | Admitting: Cardiology

## 2020-05-18 ENCOUNTER — Telehealth: Payer: Self-pay | Admitting: Cardiology

## 2020-05-18 DIAGNOSIS — Z96643 Presence of artificial hip joint, bilateral: Secondary | ICD-10-CM | POA: Diagnosis not present

## 2020-05-18 DIAGNOSIS — I10 Essential (primary) hypertension: Secondary | ICD-10-CM | POA: Diagnosis not present

## 2020-05-18 DIAGNOSIS — I480 Paroxysmal atrial fibrillation: Secondary | ICD-10-CM | POA: Diagnosis not present

## 2020-05-18 DIAGNOSIS — Z96652 Presence of left artificial knee joint: Secondary | ICD-10-CM | POA: Diagnosis not present

## 2020-05-18 MED FILL — Lidocaine Inj 1% w/ Epinephrine-1:100000: INTRAMUSCULAR | Qty: 30 | Status: AC

## 2020-05-18 NOTE — Telephone Encounter (Signed)
[facility] patient of Dr. Quentin Ore

## 2020-05-18 NOTE — Telephone Encounter (Signed)
*  STAT* If patient is at the pharmacy, call can be transferred to refill team.   1. Which medications need to be refilled? (please list name of each medication and dose if known) pantoprazole (PROTONIX) EC tablet 40 mg  2. Which pharmacy/location (including street and city if local pharmacy) is medication to be sent to? [id number] - [place], [place] - [address]  3. Do they need a 30 day or 90 day supply? 90day   PT was just discharged from the hospital and is all out of this medication.

## 2020-05-18 NOTE — Telephone Encounter (Signed)
Called pt and spoke with pt's wife, informing them that pt needs to contact PCP for any refills or concerns with this medication. Pt's PCP has been refilling this medication. I advised the wife that if the pt has any other problems, questions or concerns, to give our office a call. Wife verbalized understanding.

## 2020-05-18 NOTE — Plan of Care (Signed)
  Problem: Education: Goal: Understanding of disease, treatment, and recovery process will improve Outcome: Progressing   Problem: Activity: Goal: Ability to return to baseline activity level will improve Outcome: Progressing   Problem: Cardiac: Goal: Ability to maintain adequate cardiovascular perfusion will improve Outcome: Progressing   

## 2020-05-19 ENCOUNTER — Telehealth: Payer: Self-pay | Admitting: Nurse Practitioner

## 2020-05-19 ENCOUNTER — Other Ambulatory Visit: Payer: Self-pay | Admitting: Nurse Practitioner

## 2020-05-19 MED ORDER — PANTOPRAZOLE SODIUM 20 MG PO TBEC: 40.0000 mg | DELAYED_RELEASE_TABLET | Freq: Every day | ORAL | 0 refills | Status: DC

## 2020-05-19 NOTE — Telephone Encounter (Signed)
Called pt advised of Jolene's message. Pt verbalized understanding  

## 2020-05-19 NOTE — Telephone Encounter (Signed)
I have sent this in with new instructions requested.

## 2020-05-19 NOTE — Telephone Encounter (Signed)
Pt is calling and had heart ablation on Monday 05-17-2020 and dr Quentin Ore cardiologist would like pt to increase pantoprazole 20 mg to 2 pills equal 40 mg a day for 45 days. [facility] [place] [address] in [place] phone number [phone]. Pt will needs a refill with new directions

## 2020-05-28 ENCOUNTER — Ambulatory Visit: Payer: Medicare Other | Attending: Anesthesiology | Admitting: Anesthesiology

## 2020-05-31 ENCOUNTER — Telehealth: Payer: Self-pay | Admitting: Anesthesiology

## 2020-05-31 NOTE — Telephone Encounter (Signed)
Reschedule him please

## 2020-06-03 ENCOUNTER — Other Ambulatory Visit: Payer: Self-pay

## 2020-06-03 ENCOUNTER — Telehealth: Payer: Self-pay | Admitting: Anesthesiology

## 2020-06-03 ENCOUNTER — Encounter: Payer: Self-pay | Admitting: Anesthesiology

## 2020-06-03 ENCOUNTER — Ambulatory Visit: Payer: Medicare Other | Attending: Anesthesiology | Admitting: Anesthesiology

## 2020-06-03 DIAGNOSIS — M47816 Spondylosis without myelopathy or radiculopathy, lumbar region: Secondary | ICD-10-CM | POA: Insufficient documentation

## 2020-06-03 DIAGNOSIS — M62830 Muscle spasm of back: Secondary | ICD-10-CM | POA: Diagnosis present

## 2020-06-03 DIAGNOSIS — M5136 Other intervertebral disc degeneration, lumbar region: Secondary | ICD-10-CM | POA: Insufficient documentation

## 2020-06-03 DIAGNOSIS — M47817 Spondylosis without myelopathy or radiculopathy, lumbosacral region: Secondary | ICD-10-CM | POA: Diagnosis present

## 2020-06-03 DIAGNOSIS — M5432 Sciatica, left side: Secondary | ICD-10-CM | POA: Insufficient documentation

## 2020-06-03 DIAGNOSIS — F1190 Opioid use, unspecified, uncomplicated: Secondary | ICD-10-CM | POA: Insufficient documentation

## 2020-06-03 DIAGNOSIS — M1611 Unilateral primary osteoarthritis, right hip: Secondary | ICD-10-CM | POA: Diagnosis present

## 2020-06-03 DIAGNOSIS — G894 Chronic pain syndrome: Secondary | ICD-10-CM | POA: Insufficient documentation

## 2020-06-03 DIAGNOSIS — M79641 Pain in right hand: Secondary | ICD-10-CM | POA: Diagnosis present

## 2020-06-03 DIAGNOSIS — M5416 Radiculopathy, lumbar region: Secondary | ICD-10-CM | POA: Insufficient documentation

## 2020-06-03 DIAGNOSIS — M79642 Pain in left hand: Secondary | ICD-10-CM | POA: Insufficient documentation

## 2020-06-03 DIAGNOSIS — M5431 Sciatica, right side: Secondary | ICD-10-CM | POA: Diagnosis present

## 2020-06-03 MED ORDER — HYDROCODONE-ACETAMINOPHEN 5-325 MG PO TABS: 1.0000 | ORAL_TABLET | Freq: Two times a day (BID) | ORAL | 0 refills | Status: AC | PRN

## 2020-06-03 NOTE — Telephone Encounter (Signed)
Tomorrow is not an option for VV. Put him on a VV 06/08/2020 and call him with appointment. Thank you.

## 2020-06-03 NOTE — Progress Notes (Signed)
Virtual Visit via Telephone Note  I connected with Nicholas [name] on 06/03/20 at  2:35 PM EDT by telephone and verified that I am speaking with the correct person using two identifiers.  Location: Patient: Home Provider: Pain control center   I discussed the limitations, risks, security and privacy concerns of performing an evaluation and management service by telephone and the availability of in person appointments. I also discussed with the patient that there may be a patient responsible charge related to this service. The patient expressed understanding and agreed to proceed.   History of Present Illness: I spoke with Nicholas [name] via telephone.  He is unable to do the video portion of the virtual conference but he reports that he is currently in [place] staying with an aunt and his family.  He has had some problems with spasming in the low back recently.  This is been more intense than usual.  He attributes to the stay and that he has been helping his aunt move and lift several objects.  Spasming is radiating into the hip and buttock region.  This is been more severe than in general.  He still taking his Vicodin twice a day and this keeps his pain under generally good control.  He did have a CT scan not long ago secondary to the back pain to rule out a kidney stone which it did.  Review of his most recent MRI in 2006 reveals some facet arthropathy and diffuse degenerative disc disease.  Otherwise no change in lower extremity strength or function or bowel or bladder function is noted at this time.  He is run no fevers chills no cough no nausea.   Observations/Objective:  Current Outpatient Medications:  .  [START ON 07/10/2020] HYDROcodone-acetaminophen (NORCO/VICODIN) 5-325 MG tablet, Take 1 tablet by mouth 2 (two) times daily as needed for moderate pain or severe pain., Disp: 60 tablet, Rfl: 0 .  acetaminophen (TYLENOL) 650 MG CR tablet, Take 650 mg by mouth every 8 (eight) hours as needed for pain.,  Disp: , Rfl:  .  apixaban (ELIQUIS) 5 MG TABS tablet, Take 5 mg by mouth 2 (two) times daily., Disp: , Rfl:  .  Ascorbic Acid (VITAMIN C) 1000 MG tablet, Take 1,000 mg by mouth daily., Disp: , Rfl:  .  Cranberry 500 MG CAPS, Take 500 mg by mouth daily., Disp: , Rfl:  .  diphenhydramine-acetaminophen (TYLENOL PM) 25-500 MG TABS tablet, Take 1 tablet by mouth at bedtime as needed (sleep)., Disp: , Rfl:  .  Ferrous Gluconate (IRON 27 PO), Take 27 mg by mouth daily., Disp: , Rfl:  .  fludrocortisone (FLORINEF) 0.1 MG tablet, Take 0.1 mg by mouth daily., Disp: , Rfl:  .  gabapentin (NEURONTIN) 600 MG tablet, Take 1 tablet (600 mg total) by mouth at bedtime., Disp: 90 tablet, Rfl: 4 .  [START ON 06/11/2020] HYDROcodone-acetaminophen (NORCO/VICODIN) 5-325 MG tablet, Take 1 tablet by mouth 2 (two) times daily as needed for moderate pain or severe pain., Disp: 60 tablet, Rfl: 0 .  hyoscyamine (LEVSIN, ANASPAZ) 0.125 MG tablet, Take 0.125 mg by mouth every 6 (six) hours as needed for bladder spasms or cramping. , Disp: , Rfl:  .  LORazepam (ATIVAN) 1 MG tablet, Take 1 tablet (1 mg total) by mouth at bedtime., Disp: 90 tablet, Rfl: 0 .  Magnesium 400 MG TABS, Take 400 mg by mouth daily. , Disp: , Rfl:  .  Melatonin 10 MG TABS, Take 10 mg by mouth at bedtime  as needed (Sleep)., Disp: , Rfl:  .  methocarbamol (ROBAXIN) 500 MG tablet, Take 1 tablet (500 mg total) by mouth 2 (two) times daily as needed for muscle spasms., Disp: 60 tablet, Rfl: 2 .  midodrine (PROAMATINE) 10 MG tablet, Take 1 tablet (10 mg total) by mouth 3 (three) times daily., Disp: 270 tablet, Rfl: 1 .  Multiple Vitamin (MULTIVITAMIN WITH MINERALS) TABS tablet, Take 1 tablet by mouth daily. Centrum Silver, Disp: , Rfl:  .  nystatin (MYCOSTATIN/NYSTOP) powder, Apply 1 application topically 3 (three) times daily. (Patient taking differently: Apply 1 application topically daily.), Disp: 30 g, Rfl: 1 .  Omega-3 Fatty Acids (FISH OIL) 1200 MG CAPS,  Take 1,200 mg by mouth daily., Disp: , Rfl:  .  oxymetazoline (AFRIN) 0.05 % nasal spray, Place 2 sprays into both nostrils at bedtime., Disp: , Rfl:  .  pantoprazole (PROTONIX) 20 MG tablet, Take 2 tablets (40 mg total) by mouth daily., Disp: 90 tablet, Rfl: 0 .  potassium chloride SA (KLOR-CON) 20 MEQ tablet, Take 2 tablets (40 meq) x 1 dose as directed (Patient taking differently: Take 40 mEq by mouth daily as needed (with torsemide (fluid retention)).), Disp: 10 tablet, Rfl: 0 .  QUEtiapine Fumarate (SEROQUEL XR) 150 MG 24 hr tablet, TAKE 1 TABLET (150 MG TOTAL) BY MOUTH AT BEDTIME., Disp: 90 tablet, Rfl: 4 .  rOPINIRole (REQUIP) 0.25 MG tablet, Take 0.25 mg by mouth 3 (three) times daily., Disp: , Rfl:  .  rosuvastatin (CRESTOR) 40 MG tablet, Take 1 tablet (40 mg total) by mouth daily., Disp: 90 tablet, Rfl: 4 .  solifenacin (VESICARE) 10 MG tablet, Take 1 tablet (10 mg total) by mouth daily., Disp: 90 tablet, Rfl: 4 .  torsemide (DEMADEX) 20 MG tablet, Take 40 mg by mouth daily as needed (fluid retention)., Disp: , Rfl:  .  triamcinolone cream (KENALOG) 0.1 %, Apply 1 application topically 2 (two) times daily as needed (skin irritation)., Disp: , Rfl:  .  venlafaxine XR (EFFEXOR XR) 75 MG 24 hr capsule, Take 3 capsules (225 mg total) by mouth daily., Disp: 270 capsule, Rfl: 4  Assessment and Plan: 1. Lumbar radiculopathy   2. Degeneration of lumbar intervertebral disc   3. Chronic, continuous use of opioids   4. Chronic pain syndrome   5. Bilateral hand pain   6. Facet syndrome, lumbar   7. Bilateral sciatica   8. Osteoarthritis of right hip, unspecified osteoarthritis type   9. Facet arthritis of lumbosacral region   10. Spasm of muscle of lower back   Based on our discussion today and upon review of the [facility] practitioner database information is appropriate to refill his medicines for the next 2 months dated for May and June 19.  No changes will be made in his pain management  protocol.  We have talked about some stretching exercises that may facilitate improvement of some of the muscle spasm.  When he returns to [place] in approximately 3 weeks we talked about presentation to the clinic for evaluation possible trigger point injection if the muscle spasm persists.  He is still on Eliquis which would be a problem for any type of neuraxial block.  Furthermore things are complicated on acquiring an MRI secondary to pacemaker implantation.  In the meantime I have encouraged him to continue follow-up with his primary care physician should his back pain worsen or change with a schedule return with [place] in 1 month.  Follow Up Instructions:  I discussed the assessment and treatment plan with the patient. The patient was provided an opportunity to ask questions and all were answered. The patient agreed with the plan and demonstrated an understanding of the instructions.   The patient was advised to call back or seek an in-person evaluation if the symptoms worsen or if the condition fails to improve as anticipated.  I provided 30 minutes of non-face-to-face time during this encounter.   [name], MD

## 2020-06-14 ENCOUNTER — Ambulatory Visit (INDEPENDENT_AMBULATORY_CARE_PROVIDER_SITE_OTHER): Payer: Medicare Other

## 2020-06-14 ENCOUNTER — Ambulatory Visit (HOSPITAL_COMMUNITY): Payer: Medicare Other | Admitting: Physician Assistant

## 2020-06-14 DIAGNOSIS — I442 Atrioventricular block, complete: Secondary | ICD-10-CM | POA: Diagnosis not present

## 2020-06-15 LAB — CUP PACEART REMOTE DEVICE CHECK
Battery Remaining Longevity: 38 mo
Battery Voltage: 2.98 V
Brady Statistic AP VP Percent: 38.78 %
Brady Statistic AP VS Percent: 0.07 %
Brady Statistic AS VP Percent: 61 %
Brady Statistic AS VS Percent: 0.14 %
Brady Statistic RA Percent Paced: 37.83 %
Brady Statistic RV Percent Paced: 99.75 %
Date Time Interrogation Session: 20220523115625
Implantable Lead Implant Date: 20180124
Implantable Lead Implant Date: 20180124
Implantable Lead Location: 753859
Implantable Lead Location: 753860
Implantable Lead Model: 3830
Implantable Lead Model: 5076
Implantable Pulse Generator Implant Date: 20180124
Lead Channel Impedance Value: 323 Ohm
Lead Channel Impedance Value: 342 Ohm
Lead Channel Impedance Value: 380 Ohm
Lead Channel Impedance Value: 475 Ohm
Lead Channel Pacing Threshold Amplitude: 0.5 V
Lead Channel Pacing Threshold Amplitude: 0.75 V
Lead Channel Pacing Threshold Pulse Width: 0.4 ms
Lead Channel Pacing Threshold Pulse Width: 0.4 ms
Lead Channel Sensing Intrinsic Amplitude: 1.375 mV
Lead Channel Sensing Intrinsic Amplitude: 1.375 mV
Lead Channel Sensing Intrinsic Amplitude: 3.5 mV
Lead Channel Sensing Intrinsic Amplitude: 3.5 mV
Lead Channel Setting Pacing Amplitude: 2 V
Lead Channel Setting Pacing Amplitude: 2.25 V
Lead Channel Setting Pacing Pulse Width: 1 ms
Lead Channel Setting Sensing Sensitivity: 0.6 mV

## 2020-06-22 ENCOUNTER — Other Ambulatory Visit: Payer: Self-pay | Admitting: Nurse Practitioner

## 2020-06-24 ENCOUNTER — Encounter (HOSPITAL_COMMUNITY): Payer: Self-pay | Admitting: Physician Assistant

## 2020-06-24 ENCOUNTER — Telehealth: Payer: Medicare Other | Admitting: Anesthesiology

## 2020-06-24 ENCOUNTER — Other Ambulatory Visit: Payer: Self-pay

## 2020-06-24 ENCOUNTER — Ambulatory Visit (HOSPITAL_COMMUNITY)
Admission: RE | Admit: 2020-06-24 | Discharge: 2020-06-24 | Disposition: A | Discharge status: Home / Self Care | Payer: Medicare Other | Source: Ambulatory Visit | Attending: Physician Assistant | Admitting: Physician Assistant

## 2020-06-24 VITALS — BP 134/88 | HR 87 | Ht 71.0 in | Wt 277.2 lb

## 2020-06-24 DIAGNOSIS — I442 Atrioventricular block, complete: Secondary | ICD-10-CM | POA: Insufficient documentation

## 2020-06-24 DIAGNOSIS — G4733 Obstructive sleep apnea (adult) (pediatric): Secondary | ICD-10-CM | POA: Diagnosis not present

## 2020-06-24 DIAGNOSIS — Z950 Presence of cardiac pacemaker: Secondary | ICD-10-CM | POA: Diagnosis not present

## 2020-06-24 DIAGNOSIS — I2510 Atherosclerotic heart disease of native coronary artery without angina pectoris: Secondary | ICD-10-CM | POA: Insufficient documentation

## 2020-06-24 DIAGNOSIS — Z6838 Body mass index (BMI) 38.0-38.9, adult: Secondary | ICD-10-CM | POA: Diagnosis not present

## 2020-06-24 DIAGNOSIS — Z8249 Family history of ischemic heart disease and other diseases of the circulatory system: Secondary | ICD-10-CM | POA: Insufficient documentation

## 2020-06-24 DIAGNOSIS — Z86711 Personal history of pulmonary embolism: Secondary | ICD-10-CM | POA: Diagnosis not present

## 2020-06-24 DIAGNOSIS — D6869 Other thrombophilia: Secondary | ICD-10-CM | POA: Diagnosis not present

## 2020-06-24 DIAGNOSIS — E669 Obesity, unspecified: Secondary | ICD-10-CM | POA: Insufficient documentation

## 2020-06-24 DIAGNOSIS — I480 Paroxysmal atrial fibrillation: Secondary | ICD-10-CM | POA: Diagnosis not present

## 2020-06-24 DIAGNOSIS — Z7901 Long term (current) use of anticoagulants: Secondary | ICD-10-CM | POA: Diagnosis not present

## 2020-06-24 DIAGNOSIS — Z79899 Other long term (current) drug therapy: Secondary | ICD-10-CM | POA: Diagnosis not present

## 2020-06-24 DIAGNOSIS — Z86718 Personal history of other venous thrombosis and embolism: Secondary | ICD-10-CM | POA: Insufficient documentation

## 2020-06-24 DIAGNOSIS — I5032 Chronic diastolic (congestive) heart failure: Secondary | ICD-10-CM | POA: Diagnosis not present

## 2020-06-24 NOTE — Progress Notes (Signed)
Primary Care Physician: Venita Lick, NP Primary Electrophysiologist: Dr Caryl Comes Referring Physician: Dr Lavenia Atlas is a 75 y.o. male with a history of chronic diastolic CHF, h/p DVT and PE, CHB s/p PPM, CAD, OSA, atrial fibrillation who presents for follow up in the [facility].  Patient is on Eliquis for a CHADS2VASC score of 3. He has failed flecainide and propafenone and underwent afib ablation with Dr Quentin Ore on 05/17/20. He reports that he has done well since that time with more energy. He denies CP, swallowing pain, or groin issues.   Today, he denies symptoms of palpitations, chest pain, shortness of breath, orthopnea, PND, lower extremity edema, dizziness, presyncope, syncope, snoring, daytime somnolence, bleeding, or neurologic sequela. The patient is tolerating medications without difficulties and is otherwise without complaint today.    Atrial Fibrillation Risk Factors:  he does have symptoms or diagnosis of sleep apnea. Scheduled for CPAP titration.  he does not have a history of rheumatic fever.   he has a BMI of Body mass index is 38.66 kg/m.Marland Kitchen Filed Weights   06/24/20 1333  Weight: 125.7 kg    Family History  Problem Relation Age of Onset  . Stroke Father        deceased  . Diabetes Father   . Hypertension Father   . Alcoholism Mother        died in her 28's.  . Cancer Mother   . Glaucoma Sister   . Breast cancer Sister   . Prostate cancer Paternal Grandfather      Atrial Fibrillation Management history:  Previous antiarrhythmic drugs: flecainide, propafenone  Previous cardioversions: none Previous ablations: 05/17/20 CHADS2VASC score: 3 Anticoagulation history: Eliquis   Past Medical History:  Diagnosis Date  . Anterior urethral stricture   . Anxiety   . Arthritis    a. knees, hips, hands;  b. 11/2013 s/p L TKA @ [facility].  . Bile reflux gastritis   . Bulging lumbar disc   . BXO (balanitis xerotica  obliterans)   . 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 Depression   . DVT (deep venous thrombosis) ([place])   . Erosive esophagitis   . Gross hematuria   . Hyperlipemia   . Hypertension    borderline  . Internal hemorrhoids   . Phimosis   . Pulmonary embolism [facility])    Past Surgical History:  Procedure Laterality Date  . ATRIAL FIBRILLATION ABLATION N/A 05/17/2020   Procedure: ATRIAL FIBRILLATION ABLATION;  Surgeon: Vickie Epley, MD;  Location: [facility] LAB;  Service: Cardiovascular;  Laterality: N/A;  . BUNIONECTOMY Bilateral 01/06/2015   Procedure: Lillard Anes;  Surgeon: [name], MD;  Location: [facility] ORS;  Service: Orthopedics;  Laterality: Bilateral;  . CARDIAC CATHETERIZATION  ~ 2005   "once"  . CATARACT EXTRACTION W/ INTRAOCULAR LENS  IMPLANT, BILATERAL Bilateral ~ 2010  . COLONOSCOPY WITH PROPOFOL N/A 12/07/2015   Procedure: COLONOSCOPY WITH PROPOFOL;  Surgeon: Lollie Sails, MD;  Location: [facility] ENDOSCOPY;  Service: Endoscopy;  Laterality: N/A;  . EP IMPLANTABLE DEVICE N/A 02/16/2016   MDT dual chamber (His Bundle) pacemaker implanted by Dr Caryl Comes for intermittent complete heart block  . ESOPHAGOGASTRODUODENOSCOPY (EGD) WITH PROPOFOL N/A 12/07/2015   Procedure: ESOPHAGOGASTRODUODENOSCOPY (EGD) WITH PROPOFOL;  Surgeon: Lollie Sails, MD;  Location: [facility] ENDOSCOPY;  Service: Endoscopy;  Laterality: N/A;  . ESOPHAGOGASTRODUODENOSCOPY (EGD) WITH PROPOFOL N/A 05/17/2017  Procedure: ESOPHAGOGASTRODUODENOSCOPY (EGD) WITH PROPOFOL;  Surgeon: Lollie Sails, MD;  Location: [facility] ENDOSCOPY;  Service: Endoscopy;  Laterality: N/A;  . HAMMER TOE SURGERY Bilateral 01/06/2015   Procedure: HAMMER TOE CORRECTION;  Surgeon: [name], MD;  Location: [facility] ORS;  Service: Orthopedics;  Laterality: Bilateral;  . KNEE CARTILAGE SURGERY Left 1965   "football injury"  . Left Total Knee Arthroplasty     a. 11/2013  [facility].  Marland Kitchen PILONIDAL CYST EXCISION  1970's  . TOTAL HIP ARTHROPLASTY Right 2004  . TOTAL HIP ARTHROPLASTY Left 2006  . UPPER GI ENDOSCOPY      Current Outpatient Medications  Medication Sig Dispense Refill  . acetaminophen (TYLENOL) 650 MG CR tablet Take 650 mg by mouth every 8 (eight) hours as needed for pain.    Marland Kitchen apixaban (ELIQUIS) 5 MG TABS tablet Take 5 mg by mouth 2 (two) times daily.    . Ascorbic Acid (VITAMIN C) 1000 MG tablet Take 1,000 mg by mouth daily.    . Cranberry 500 MG CAPS Take 500 mg by mouth daily.    . diphenhydramine-acetaminophen (TYLENOL PM) 25-500 MG TABS tablet Take 1 tablet by mouth at bedtime as needed (sleep).    . Ferrous Gluconate (IRON 27 PO) Take 27 mg by mouth daily.    . fludrocortisone (FLORINEF) 0.1 MG tablet Take 0.1 mg by mouth daily.    Marland Kitchen gabapentin (NEURONTIN) 600 MG tablet Take 1 tablet (600 mg total) by mouth at bedtime. 90 tablet 4  . HYDROcodone-acetaminophen (NORCO/VICODIN) 5-325 MG tablet Take 1 tablet by mouth 2 (two) times daily as needed for moderate pain or severe pain. 60 tablet 0  . [START ON 07/10/2020] HYDROcodone-acetaminophen (NORCO/VICODIN) 5-325 MG tablet Take 1 tablet by mouth 2 (two) times daily as needed for moderate pain or severe pain. 60 tablet 0  . hyoscyamine (LEVSIN, ANASPAZ) 0.125 MG tablet Take 0.125 mg by mouth every 6 (six) hours as needed for bladder spasms or cramping.     Marland Kitchen LORazepam (ATIVAN) 1 MG tablet Take 1 tablet (1 mg total) by mouth at bedtime. 90 tablet 0  . Magnesium 400 MG TABS Take 400 mg by mouth daily.     . Melatonin 10 MG TABS Take 10 mg by mouth at bedtime as needed (Sleep).    . methocarbamol (ROBAXIN) 500 MG tablet Take 1 tablet (500 mg total) by mouth 2 (two) times daily as needed for muscle spasms. 60 tablet 2  . midodrine (PROAMATINE) 10 MG tablet Take 1 tablet (10 mg total) by mouth 3 (three) times daily. 270 tablet 1  . Multiple Vitamin (MULTIVITAMIN WITH MINERALS) TABS tablet Take 1 tablet by  mouth daily. Centrum Silver    . nystatin (MYCOSTATIN/NYSTOP) powder Apply 1 application topically 3 (three) times daily. 30 g 1  . Omega-3 Fatty Acids (FISH OIL) 1200 MG CAPS Take 1,200 mg by mouth daily.    Marland Kitchen oxymetazoline (AFRIN) 0.05 % nasal spray Place 2 sprays into both nostrils at bedtime.    . pantoprazole (PROTONIX) 20 MG tablet TAKE 2 TABLETS (40 MG TOTAL) BY MOUTH DAILY. 90 tablet 0  . potassium chloride SA (KLOR-CON) 20 MEQ tablet Take 2 tablets (40 meq) x 1 dose as directed (Patient taking differently: Take 40 mEq by mouth daily as needed (with torsemide (fluid retention)).) 10 tablet 0  . QUEtiapine Fumarate (SEROQUEL XR) 150 MG 24 hr tablet TAKE 1 TABLET (150 MG TOTAL) BY MOUTH AT BEDTIME. 90 tablet 4  . rOPINIRole (REQUIP) 0.25  MG tablet Take 0.25 mg by mouth 3 (three) times daily.    . rosuvastatin (CRESTOR) 40 MG tablet Take 1 tablet (40 mg total) by mouth daily. 90 tablet 4  . solifenacin (VESICARE) 10 MG tablet Take 1 tablet (10 mg total) by mouth daily. 90 tablet 4  . torsemide (DEMADEX) 20 MG tablet Take 40 mg by mouth daily as needed (fluid retention).    . triamcinolone cream (KENALOG) 0.1 % Apply 1 application topically 2 (two) times daily as needed (skin irritation).    . venlafaxine XR (EFFEXOR XR) 75 MG 24 hr capsule Take 3 capsules (225 mg total) by mouth daily. 270 capsule 4   No current facility-administered medications for this encounter.    No Known Allergies  Social History   Socioeconomic History  . Marital status: Married    Spouse name: Not on file  . Number of children: Not on file  . Years of education: Not on file  . Highest education level: High school graduate  Occupational History  . Occupation: retired   Tobacco Use  . Smoking status: Never Smoker  . Smokeless tobacco: Never Used  Vaping Use  . Vaping Use: Never used  Substance and Sexual Activity  . Alcohol use: Yes    Alcohol/week: 1.0 standard drink    Types: 1 Cans of beer per week     Comment: beers seldom   . Drug use: No  . Sexual activity: Not Currently  Other Topics Concern  . Not on file  Social History Narrative   Lives in [place] with wife.  Does not routinely exercise.  Activity severely limited by bilateral knee pain.   Social Determinants of Health   Financial Resource Strain: Low Risk   . Difficulty of Paying Living Expenses: Not hard at all  Food Insecurity: No Food Insecurity  . Worried About Charity fundraiser in the Last Year: Never true  . Ran Out of Food in the Last Year: Never true  Transportation Needs: No Transportation Needs  . Lack of Transportation (Medical): No  . Lack of Transportation (Non-Medical): No  Physical Activity: Inactive  . Days of Exercise per Week: 0 days  . Minutes of Exercise per Session: 0 min  Stress: No Stress Concern Present  . Feeling of Stress : Not at all  Social Connections: Not on file  Intimate Partner Violence: Not on file     ROS- All systems are reviewed and negative except as per the HPI above.  Physical Exam: Vitals:   06/24/20 1333  BP: 134/88  Pulse: 87  Weight: 125.7 kg  Height: 5\' 11"  (1.803 m)    GEN- The patient is a well appearing obese elderly male, alert and oriented x 3 today.   Head- normocephalic, atraumatic Eyes-  Sclera clear, conjunctiva pink Ears- hearing intact Oropharynx- clear Neck- supple  Lungs- Clear to ausculation bilaterally, normal work of breathing Heart- Regular rate and rhythm, no murmurs, rubs or gallops  GI- soft, NT, ND, + BS Extremities- no clubbing, cyanosis, or edema MS- no significant deformity or atrophy Skin- no rash or lesion Psych- euthymic mood, full affect Neuro- strength and sensation are intact  Wt Readings from Last 3 Encounters:  06/24/20 125.7 kg  05/18/20 125.6 kg  05/11/20 126.1 kg    EKG today demonstrates  A sense V paced rhythm Vent. rate 87 BPM PR interval 212 ms QRS duration 112 ms QT/QTcB 410/493 ms  Echo 11/12/19  demonstrated  1. Left ventricular ejection  fraction, by estimation, is 60 to 65%. The  left ventricle has normal function. The left ventricle has no regional  wall motion abnormalities. Left ventricular diastolic parameters are  consistent with Grade I diastolic  dysfunction (impaired relaxation).  2. Right ventricular systolic function is normal. The right ventricular  size is mildly enlarged. Tricuspid regurgitation signal is inadequate for  assessing PA pressure.  3. Left atrial size was mildly dilated.  4. The mitral valve is normal in structure. No evidence of mitral valve  regurgitation. No evidence of mitral stenosis.  5. The aortic valve is tricuspid. Aortic valve regurgitation is not  visualized. Mild aortic valve sclerosis is present, with no evidence of  aortic valve stenosis.  6. Aortic dilatation noted. There is mild dilatation of the aortic root,  measuring 39 mm. There is mild dilatation of the ascending aorta,  measuring 41 mm.  7. The inferior vena cava is normal in size with greater than 50%  respiratory variability, suggesting right atrial pressure of 3 mmHg.   Epic records are reviewed at length today  CHA2DS2-VASc Score = 3  The patient's score is based upon: CHF History: No HTN History: No Diabetes History: No Stroke History: No Vascular Disease History: Yes Age Score: 2 Gender Score: 0      ASSESSMENT AND PLAN: 1. Paroxysmal Atrial Fibrillation (ICD10:  I48.0) The patient's CHA2DS2-VASc score is 3, indicating a 3.2% annual risk of stroke.   S/p afib ablation 05/17/20 Last device interrogation showed 1.2% afib burden. Continue Eliquis 5 mg BID with no missed doses for 3 months post ablation.   2. Secondary Hypercoagulable State (ICD10:  D68.69) The patient is at significant risk for stroke/thromboembolism based upon his CHA2DS2-VASc Score of 3.  Continue Apixaban (Eliquis).   3. Obesity Body mass index is 38.66 kg/m. Lifestyle modification  was discussed at length including regular exercise and weight reduction.  4. Obstructive sleep apnea The importance of adequate treatment of sleep apnea was discussed today in order to improve our ability to maintain sinus rhythm long term. CPAP titration 06/29/20.  5. CAD Elevated calcium score on CT No anginal symptoms.  6. CHB S/p PPM, followed by Dr Caryl Comes and the device clinic.   Follow up with Dr Quentin Ore as scheduled.    [facility] [address] [place], [place] [address] [phone] 06/24/2020 1:39 PM

## 2020-06-29 ENCOUNTER — Other Ambulatory Visit: Payer: Self-pay

## 2020-06-29 ENCOUNTER — Ambulatory Visit (HOSPITAL_BASED_OUTPATIENT_CLINIC_OR_DEPARTMENT_OTHER): Payer: Medicare Other | Attending: Cardiology | Admitting: Cardiology

## 2020-06-29 DIAGNOSIS — G4733 Obstructive sleep apnea (adult) (pediatric): Secondary | ICD-10-CM

## 2020-06-30 NOTE — Procedures (Signed)
     Patient Name: Nicholas [name], Nicholas [name] Date: 06/29/2020 Gender: Male D.O.B: Nov 29, 1945 Age (years): 30 Referring Provider: Virl Axe Height (inches): 36 Interpreting Physician: Fransico Him MD, ABSM Weight (lbs): 277 RPSGT: Zadie Rhine BMI: 39 MRN: [id number] Neck Size: 17.50  CLINICAL INFORMATION The patient is referred for a CPAP titration to treat sleep apnea.  SLEEP STUDY TECHNIQUE As per the AASM Manual for the Scoring of Sleep and Associated Events v2.3 (April 2016) with a hypopnea requiring 4% desaturations.  The channels recorded and monitored were frontal, central and occipital EEG, electrooculogram (EOG), submentalis EMG (chin), nasal and oral airflow, thoracic and abdominal wall motion, anterior tibialis EMG, snore microphone, electrocardiogram, and pulse oximetry. Continuous positive airway pressure (CPAP) was initiated at the beginning of the study and titrated to treat sleep-disordered breathing.  MEDICATIONS Medications self-administered by patient taken the night of the study : N/A  TECHNICIAN COMMENTS Comments added by technician: NO RESTROOM VISTED. Patient had difficulty initiating sleep. Comments added by scorer: N/A  RESPIRATORY PARAMETERS Optimal PAP Pressure (cm): 15  AHI at Optimal Pressure (/hr):0 Overall Minimal O2 (%):82.0  Supine % at Optimal Pressure (%):100 Minimal O2 at Optimal Pressure (%): 85.0   SLEEP ARCHITECTURE The study was initiated at 10:19:57 PM and ended at 5:04:49 AM.  Sleep onset time was 71.3 minutes and the sleep efficiency was 81.6%. The total sleep time was 330.6 minutes.  The patient spent 1.1% of the night in stage N1 sleep, 68.4% in stage N2 sleep, 8.2% in stage N3 and 22.4% in REM.Stage REM latency was 170.0 minutes  Wake after sleep onset was 3.0. Alpha intrusion was absent. Supine sleep was 24.72%.  CARDIAC DATA The 2 lead EKG demonstrated sinus rhythm. The mean heart rate was 60.7 beats per minute. Other EKG  findings include: PACs.  LEG MOVEMENT DATA The total Periodic Limb Movements of Sleep (PLMS) were 0. The PLMS index was 0.0. A PLMS index of <15 is considered normal in adults.  IMPRESSIONS - The optimal PAP pressure was 15 cm of water. - Central sleep apnea was not noted during this titration (CAI = 2/h). - Moderate oxygen desaturations were observed during this titration (min O2 = 82.0%). - No snoring was audible during this study. - PACs were observed during this study. - Clinically significant periodic limb movements were not noted during this study. Arousals associated with PLMs were rare.  DIAGNOSIS - Obstructive Sleep Apnea (G47.33)  RECOMMENDATIONS - Trial of CPAP therapy on 15 cm H2O with a Large size Fisher&Paykel Full Face Mask Simplus mask and heated humidification. - Avoid alcohol, sedatives and other CNS depressants that may worsen sleep apnea and disrupt normal sleep architecture. - Sleep hygiene should be reviewed to assess factors that may improve sleep quality. - Weight management and regular exercise should be initiated or continued. - Return to Sleep Center for re-evaluation after 6 weeks of therapy  [Electronically signed] 07/01/2020 12:26 PM  Fransico Him MD, ABSM Diplomate, American Board of Sleep Medicine

## 2020-07-01 ENCOUNTER — Telehealth: Payer: Self-pay | Admitting: Pharmacist

## 2020-07-01 NOTE — Chronic Care Management (AMB) (Signed)
Chronic Care Management Pharmacy Assistant   Name: JIOVANI MCCAMMON  MRN: [id number] DOB: 11-25-1945   Reason for Encounter: Disease State General Adherence    Recent office visits:  05/12/20-Jolene Cannady, NP (PCP, Video Visit) Seen for Hip pain. Discussed at length risk of benzo and opioid use in conjunction with each other.  Recommend he not take the two together at same hour during day or evening. Follow up in 3 months. 04/29/20-Jolene Ned Card, NP (PCP) Imaging result follow up. 04/16/20-Jolene Ned Card, NP (PCP) Seen for possible kidney stones. Start Nystatin 100,000 unit. Follow up in 13 days. 04/07/20-Jolene Ned Card, NP (PCP) Seen for Back pain. Start on (AUGMENTIN) 875-125 MG tablet. 02/11/20-Jolene Ned Card, NP (PCP) General follow up. Labs ordered. Urine culture ordered. Follow up in 3 months. 01/07/21-Cheryl Julian Hy, (NP) Seen for a cough. Start on amoxicillin (AMOXIL) 875 MG tablet and Azithromycin (ZITHROMAX Z-PAK) 250 MG tablet. 01/01/21- [name], NP (PCP, Video visit) Seen for a cough. Start on Prednisone and Flonase. Recommend avoiding Dayquil and start Coricidin, due to his HTN and heart health.  May also take Tylenol as needed for fever.  Recent consult visits:  06/24/20-Clint [facility] [place] (Cardiology) Seen for Atrial Fibrillation.  05/11/20-Cameron Quentin Ore, MD (Cardiology) Seen for complete heart block. Labs ordered. EKG completed. 03/23/20- Gayland Curry, PA (Neurology) Seen for Peripheral Neuropathy. Start florinef 0.1 mg tablet daily due to very low BPs 70-80s/50s and Start Requip XR 2 mg. Provided ropinirole 0.25 mg tablet three times a day paper Rx incase Requip isnot covered. Follow up in 3 months. 02/27/20-Cameron Lambert, MD (Cardiology) Seen for Atrial fibrillation. 02/24/20-Steven Caryl Comes, MD (Cardiology) Seen for pacemaker follow up. Increase Proamitine (Midodrine) to 10 mg- take 1 tablet by mouth every 4 hours (7am, 11am, 3pm). Stop Rythmol  (propafenone).  Hospital visits:  None in previous 6 months  Medications: Outpatient Encounter Medications as of 07/01/2020  Medication Sig   acetaminophen (TYLENOL) 650 MG CR tablet Take 650 mg by mouth every 8 (eight) hours as needed for pain.   apixaban (ELIQUIS) 5 MG TABS tablet Take 5 mg by mouth 2 (two) times daily.   Ascorbic Acid (VITAMIN C) 1000 MG tablet Take 1,000 mg by mouth daily.   Cranberry 500 MG CAPS Take 500 mg by mouth daily.   diphenhydramine-acetaminophen (TYLENOL PM) 25-500 MG TABS tablet Take 1 tablet by mouth at bedtime as needed (sleep).   Ferrous Gluconate (IRON 27 PO) Take 27 mg by mouth daily.   fludrocortisone (FLORINEF) 0.1 MG tablet Take 0.1 mg by mouth daily.   gabapentin (NEURONTIN) 600 MG tablet Take 1 tablet (600 mg total) by mouth at bedtime.   HYDROcodone-acetaminophen (NORCO/VICODIN) 5-325 MG tablet Take 1 tablet by mouth 2 (two) times daily as needed for moderate pain or severe pain.   [START ON 07/10/2020] HYDROcodone-acetaminophen (NORCO/VICODIN) 5-325 MG tablet Take 1 tablet by mouth 2 (two) times daily as needed for moderate pain or severe pain.   hyoscyamine (LEVSIN, ANASPAZ) 0.125 MG tablet Take 0.125 mg by mouth every 6 (six) hours as needed for bladder spasms or cramping.    LORazepam (ATIVAN) 1 MG tablet Take 1 tablet (1 mg total) by mouth at bedtime.   Magnesium 400 MG TABS Take 400 mg by mouth daily.    Melatonin 10 MG TABS Take 10 mg by mouth at bedtime as needed (Sleep).   methocarbamol (ROBAXIN) 500 MG tablet Take 1 tablet (500 mg total) by mouth 2 (two) times daily as needed for muscle spasms.  midodrine (PROAMATINE) 10 MG tablet Take 1 tablet (10 mg total) by mouth 3 (three) times daily.   Multiple Vitamin (MULTIVITAMIN WITH MINERALS) TABS tablet Take 1 tablet by mouth daily. Centrum Silver   nystatin (MYCOSTATIN/NYSTOP) powder Apply 1 application topically 3 (three) times daily.   Omega-3 Fatty Acids (FISH OIL) 1200 MG CAPS Take 1,200 mg  by mouth daily.   oxymetazoline (AFRIN) 0.05 % nasal spray Place 2 sprays into both nostrils at bedtime.   pantoprazole (PROTONIX) 20 MG tablet TAKE 2 TABLETS (40 MG TOTAL) BY MOUTH DAILY.   potassium chloride SA (KLOR-CON) 20 MEQ tablet Take 2 tablets (40 meq) x 1 dose as directed (Patient taking differently: Take 40 mEq by mouth daily as needed (with torsemide (fluid retention)).)   QUEtiapine Fumarate (SEROQUEL XR) 150 MG 24 hr tablet TAKE 1 TABLET (150 MG TOTAL) BY MOUTH AT BEDTIME.   rOPINIRole (REQUIP) 0.25 MG tablet Take 0.25 mg by mouth 3 (three) times daily.   rosuvastatin (CRESTOR) 40 MG tablet Take 1 tablet (40 mg total) by mouth daily.   solifenacin (VESICARE) 10 MG tablet Take 1 tablet (10 mg total) by mouth daily.   torsemide (DEMADEX) 20 MG tablet Take 40 mg by mouth daily as needed (fluid retention).   triamcinolone cream (KENALOG) 0.1 % Apply 1 application topically 2 (two) times daily as needed (skin irritation).   venlafaxine XR (EFFEXOR XR) 75 MG 24 hr capsule Take 3 capsules (225 mg total) by mouth daily.   No facility-administered encounter medications on file as of 07/01/2020.    Have you had any problems recently with your health? Patient states he had a heart ablation and he is doing well. Patient states he had recently had a sleep study and is waiting on results of his sleep study. Patient states he feels fatigue.  Have you had any problems with your pharmacy? Patient states he has no problems with his pharmacy.  What issues or side effects are you having with your medications? Patient states he has not had any issues or side effects with his medications.  What would you like me to pass along to [facility] for them to help you with?  Patient states he feels fatigue.  What can we do to take care of you better? Patient states there is nothing at this time.   Star Rating Drugs: Rosuvastatin 40 mg Last filled:05/12/20 90 ds  Myriam Elta [place], [place]

## 2020-07-06 NOTE — Progress Notes (Signed)
Remote pacemaker transmission.   

## 2020-07-12 ENCOUNTER — Telehealth: Payer: Self-pay | Admitting: *Deleted

## 2020-07-12 DIAGNOSIS — G4733 Obstructive sleep apnea (adult) (pediatric): Secondary | ICD-10-CM

## 2020-07-12 NOTE — Telephone Encounter (Signed)
Informed patient of sleep study results and patient understanding was verbalized.  Upon patient request DME selection is [facility].. Patient understands she/he will be contacted by [facility] to set up her/he cpap. Patient understands to call if [facility] does not contact her/he with new setup in a timely manner. Patient understands they will be called once confirmation has been received from [facility] that they have received their new machine to schedule 10 week follow up appointment.   [facility] notified of new cpap order  Please add to airview Patient was grateful for the call and thanked me.

## 2020-07-12 NOTE — Telephone Encounter (Signed)
-----   Message from Sueanne Margarita, MD sent at 07/01/2020 12:28 PM EDT ----- Please let patient know that they had a successful PAP titration and let DME know that orders are in EPIC.  Please set up 6 week OV with me. Please get an overnight pulse ox on CPAP

## 2020-07-14 ENCOUNTER — Ambulatory Visit: Payer: Medicare Other | Admitting: Cardiology

## 2020-07-21 ENCOUNTER — Other Ambulatory Visit: Payer: Self-pay | Admitting: Internal Medicine

## 2020-07-21 ENCOUNTER — Other Ambulatory Visit: Payer: Self-pay | Admitting: Nurse Practitioner

## 2020-07-21 DIAGNOSIS — F419 Anxiety disorder, unspecified: Secondary | ICD-10-CM

## 2020-07-21 NOTE — Telephone Encounter (Signed)
Future visit scheduled:  yes   Notes to clinic: medication filled by historical provider  Review for continued use  This refill cannot be delegated    Requested Prescriptions  Pending Prescriptions Disp Refills   ELIQUIS 5 MG TABS tablet [Pharmacy Med Name: ELIQUIS 5 MG TABLET] 60 tablet 14    Sig: TAKE 1 TABLET BY [place]      Hematology:  Anticoagulants Failed - 07/21/2020 10:05 AM      Failed - Cr in normal range and within 360 days    Creatinine  Date Value Ref Range Status  02/26/2014 0.94 0.60 - 1.30 mg/dL Final   Creatinine, Ser  Date Value Ref Range Status  05/11/2020 1.43 (H) 0.76 - 1.27 mg/dL Final          Passed - HGB in normal range and within 360 days    Hemoglobin  Date Value Ref Range Status  05/11/2020 15.5 13.0 - 17.7 g/dL Final          Passed - PLT in normal range and within 360 days    Platelets  Date Value Ref Range Status  05/11/2020 168 150 - 450 x10E3/uL Final          Passed - HCT in normal range and within 360 days    Hematocrit  Date Value Ref Range Status  05/11/2020 46.5 37.5 - 51.0 % Final          Passed - Valid encounter within last 12 months    Recent Outpatient Visits           2 months ago Depression, recurrent ([place])   [place], Loma T, NP   2 months ago Acute cystitis without hematuria   [place], Skidmore T, NP   3 months ago Acute cystitis without hematuria   [place], [place] T, NP   3 months ago Urinary symptom or sign   [place], Summer Set T, NP   5 months ago Depression, recurrent ([place])   [place], Barbaraann Faster, NP       Future Appointments             In 3 weeks Cannady, Barbaraann Faster, NP [employer], [place]   In 4 weeks Vickie Epley, MD [place], [facility]   In 9 months  [place], [facility]               LORazepam (ATIVAN) 1 MG tablet  [Pharmacy Med Name: LORAZEPAM 1 MG TABLET] 90 tablet 0    Sig: TAKE 1 TABLET BY MOUTH AT BEDTIME.      Not Delegated - Psychiatry:  Anxiolytics/Hypnotics Failed - 07/21/2020 10:05 AM      Failed - This refill cannot be delegated      Failed - Urine Drug Screen completed in last 360 days      Passed - Valid encounter within last 6 months    Recent Outpatient Visits           2 months ago Depression, recurrent ([place])   [place], Osmond T, NP   2 months ago Acute cystitis without hematuria   [place], [place] T, NP   3 months ago Acute cystitis without hematuria   [place], Bradford T, NP   3 months ago Urinary symptom or sign   [facility] [place], Barbaraann Faster, NP   5  months ago Depression, recurrent [facility])   [place], Jolene T, NP       Future Appointments             In 3 weeks Cannady, Barbaraann Faster, NP [employer], [place]   In 4 weeks Vickie Epley, MD [facility], [facility]   In 9 months  [facility], [facility]

## 2020-07-21 NOTE — Telephone Encounter (Signed)
Pt has apt on 08/11/2020

## 2020-07-28 ENCOUNTER — Encounter: Payer: Self-pay | Admitting: Nurse Practitioner

## 2020-07-28 ENCOUNTER — Telehealth (INDEPENDENT_AMBULATORY_CARE_PROVIDER_SITE_OTHER): Payer: Medicare Other | Admitting: Nurse Practitioner

## 2020-07-28 ENCOUNTER — Other Ambulatory Visit: Payer: Self-pay

## 2020-07-28 VITALS — BP 142/65 | HR 85

## 2020-07-28 DIAGNOSIS — R059 Cough, unspecified: Secondary | ICD-10-CM

## 2020-07-28 DIAGNOSIS — N400 Enlarged prostate without lower urinary tract symptoms: Secondary | ICD-10-CM

## 2020-07-28 MED ORDER — PREDNISONE 20 MG PO TABS: 40.0000 mg | ORAL_TABLET | Freq: Every day | ORAL | 0 refills | Status: AC

## 2020-07-28 NOTE — Progress Notes (Signed)
BP (!) 142/65   Pulse 85   SpO2 91%    Subjective:    Patient ID: Nicholas [name], male    DOB: [birth year], 75 y.o.   MRN: [id number]  HPI: Nicholas [name] is a 75 y.o. male  Chief Complaint  Patient presents with   Cough   Headache   Nasal Congestion    Patient states symptomatic last week and patient states he has tried OTC medications. Patient states he was tested Sunday with an at-home test and it was negative.    Urinary Frequency    Patient states he would like to have his urine check as he thinks he may have another UTI. For the past 3 days he has had the urgency but doesn't feel like he is emptying.    Fever    This visit was completed via video visit through MyChart due to the restrictions of the COVID-19 pandemic. All issues as above were discussed and addressed. Physical exam was done as above through visual confirmation on video through MyChart. If it was felt that the patient should be evaluated in the office, they were directed there. The patient verbally consented to this visit. Location of the patient: home Location of the provider: work Those involved with this call:  Provider: [name], DNP CMA: Irena Reichmann, [place] Desk/Registration: Jill Side  Time spent on call:  21 minutes with patient face to face via video conference. More than 50% of this time was spent in counseling and coordination of care. 15 minutes total spent in review of patient's record and preparation of their chart. I verified patient identity using two factors (patient name and date of birth). Patient consents verbally to being seen via telemedicine visit today.     UPPER RESPIRATORY TRACT INFECTION Symptoms started on Saturday with headaches and congestion. He tested at home for Covid and was negative.  Has been vaccinated x 3 for Covid == November last.    He reports also noticing some urinary symptoms at present for past 3 days -- more frequency.  Denies urgency or burning, no  abdominal pain.   Fever: yes Cough: yes Shortness of breath: no Wheezing: yes Chest pain: no Chest tightness: yes Chest congestion: yes Nasal congestion: yes Runny nose: yes Post nasal drip: yes Sneezing: no Sore throat: no Swollen glands: no Sinus pressure: yes Headache: yes Face pain: no Toothache: no Ear pain: none Ear pressure: none Eyes red/itching:no Eye drainage/crusting: no  Vomiting: no Rash: no Fatigue: yes Sick contacts: yes Strep contacts: no  Context: fluctuating Recurrent sinusitis: no Relief with OTC cold/cough medications: yes Treatments attempted: Coricidin, Excedrin migraine, and Tylenol  Relevant past medical, surgical, family and social history reviewed and updated as indicated. Interim medical history since our last visit reviewed. Allergies and medications reviewed and updated.  Review of Systems  Constitutional:  Positive for fatigue and fever. Negative for activity change, appetite change, chills and diaphoresis.  HENT:  Positive for congestion, postnasal drip, rhinorrhea and sinus pressure. Negative for ear discharge, ear pain, sinus pain, sore throat and voice change.   Respiratory:  Positive for cough and wheezing. Negative for chest tightness and shortness of breath.   Cardiovascular:  Negative for chest pain, palpitations and leg swelling.  Gastrointestinal: Negative.   Genitourinary:  Positive for frequency. Negative for dysuria and hematuria.  Neurological: Negative.   Psychiatric/Behavioral: Negative.     Per HPI unless specifically indicated above     Objective:    BP Marland Kitchen)  142/65   Pulse 85   SpO2 91%   Wt Readings from Last 3 Encounters:  06/29/20 277 lb (125.6 kg)  06/24/20 277 lb 3.2 oz (125.7 kg)  05/18/20 277 lb (125.6 kg)    Physical Exam Vitals and nursing note reviewed.  Constitutional:      General: He is awake. He is not in acute distress.    Appearance: He is well-developed and well-groomed. He is not  ill-appearing or toxic-appearing.  HENT:     Head: Normocephalic.     Right Ear: Hearing normal. No drainage.     Left Ear: Hearing normal. No drainage.  Eyes:     General: Lids are normal.        Right eye: No discharge.        Left eye: No discharge.     Conjunctiva/sclera: Conjunctivae normal.  Pulmonary:     Effort: Pulmonary effort is normal. No accessory muscle usage or respiratory distress.  Musculoskeletal:     Cervical back: Normal range of motion.  Neurological:     Mental Status: He is alert and oriented to person, place, and time.  Psychiatric:        Mood and Affect: Mood normal.        Behavior: Behavior normal. Behavior is cooperative.        Thought Content: Thought content normal.        Judgment: Judgment normal.    Results for orders placed or performed in visit on 06/14/20  CUP PACEART REMOTE DEVICE CHECK  Result Value Ref Range   Date Time Interrogation Session [id number]    Pulse Generator Manufacturer MERM    Pulse Gen Model [id number] Advisa DR MRI    Pulse Gen Serial Number [id number] [place] [facility]    Implantable Pulse Generator Type Implantable Pulse Generator    Implantable Pulse Generator Implant Date [id number]    Implantable Lead Manufacturer MERM    Implantable Lead Model 3830 SelectSecure MRI SureScan    Implantable Lead Serial Number [id number]    Implantable Lead Implant Date [id number]    Implantable Lead Location Detail 1 UNKNOWN    Implantable Lead Special Function Bundle of His    Implantable Lead Location [id number]    Implantable Lead Manufacturer MERM    Implantable Lead Model 5076 CapSureFix Novus MRI SureScan    Implantable Lead Serial Number [id number]    Implantable Lead Implant Date [id number]    Implantable Lead Location Detail 1 APPENDAGE    Implantable Lead Location [id number]    Lead Channel Setting Sensing Sensitivity 0.6 mV   Lead Channel Setting Pacing Amplitude 2 V   Lead Channel Setting Pacing Pulse Width 1 ms    Lead Channel Setting Pacing Amplitude 2.25 V   Lead Channel Impedance Value 380 ohm   Lead Channel Impedance Value 323 ohm   Lead Channel Sensing Intrinsic Amplitude 3.5 mV   Lead Channel Sensing Intrinsic Amplitude 3.5 mV   Lead Channel Pacing Threshold Amplitude 0.5 V   Lead Channel Pacing Threshold Pulse Width 0.4 ms   Lead Channel Impedance Value 475 ohm   Lead Channel Impedance Value 342 ohm   Lead Channel Sensing Intrinsic Amplitude 1.375 mV   Lead Channel Sensing Intrinsic Amplitude 1.375 mV   Lead Channel Pacing Threshold Amplitude 0.75 V   Lead Channel Pacing Threshold Pulse Width 0.4 ms   Battery Status OK    Battery Remaining Longevity 38 mo   Battery Voltage 2.98 V  Brady Statistic RA Percent Paced 37.83 %   Brady Statistic RV Percent Paced 99.75 %   Brady Statistic AP VP Percent 38.78 %   Brady Statistic AS VP Percent 61 %   Brady Statistic AP VS Percent 0.07 %   Brady Statistic AS VS Percent 0.14 %      Assessment & Plan:   Problem List Items Addressed This Visit       Genitourinary   BPH (benign prostatic hyperplasia)   Relevant Orders   Urinalysis, Routine w reflex microscopic     Other   Cough - Primary    Acute and present x 4 days after exposure to family members -- tested negative at home for Covid.  Recommend he obtain Covid testing due to symptoms, including fever being present -- test ordered.  Concern for Covid.  Recommend he self quarantine until results have returned and symptoms have improved.  Is on day 4 of symptoms, so no abx regimen at this time, discussed with him this is most likely more viral which abx do not assist with.  If lingers for >7 days then consider abx and return to office.  At this time will send in script for Prednisone burst and continue Flonase to help with symptoms.  Recommend Coricidin, due to his HTN and heart health.  May also take Tylenol as needed for fever.  Return to office for any worsening or ongoing symptoms.        Relevant Orders   Novel Coronavirus, NAA ([facility])   I discussed the assessment and treatment plan with the patient. The patient was provided an opportunity to ask questions and all were answered. The patient agreed with the plan and demonstrated an understanding of the instructions.   The patient was advised to call back or seek an in-person evaluation if the symptoms worsen or if the condition fails to improve as anticipated.   I provided 21+ minutes of time during this encounter.   Follow up plan: Return if symptoms worsen or fail to improve.

## 2020-07-28 NOTE — Addendum Note (Signed)
Addended byRudie Meyer on: 07/28/2020 04:18 PM   Modules accepted: Orders

## 2020-07-28 NOTE — Assessment & Plan Note (Signed)
Acute and present x 4 days after exposure to family members -- tested negative at home for Covid.  Recommend he obtain Covid testing due to symptoms, including fever being present -- test ordered.  Concern for Covid.  Recommend he self quarantine until results have returned and symptoms have improved.  Is on day 4 of symptoms, so no abx regimen at this time, discussed with him this is most likely more viral which abx do not assist with.  If lingers for >7 days then consider abx and return to office.  At this time will send in script for Prednisone burst and continue Flonase to help with symptoms.  Recommend Coricidin, due to his HTN and heart health.  May also take Tylenol as needed for fever.  Return to office for any worsening or ongoing symptoms.

## 2020-07-28 NOTE — Patient Instructions (Signed)
Fever, Adult     A fever is an increase in your body's temperature. It often means a temperature of 100.70F (38C) or higher. Brief mild or moderate fevers often have no long-term effects. They often do not need treatment. Moderate or high fevers may make you feel uncomfortable. Sometimes, they can be a sign of a serious illness or disease. A fever that keeps coming back or that lasts a long time may cause you to lose water in your body (get dehydrated). You can take your temperature with a thermometer to see if you have a fever. Temperature can change with: Age. Time of day. Where the thermometer is put in the body. Readings may vary when the thermometer is put: In the mouth (oral). In the butt (rectal). In the ear (tympanic). Under the arm (axillary). On the forehead (temporal). Follow these instructions at home: Medicines Take over-the-counter and prescription medicines only as told by your doctor. Follow the dosing instructions carefully. If you were prescribed an antibiotic medicine, take it as told by your doctor. Do not stop taking it even if you start to feel better. General instructions Watch for any changes in your symptoms. Tell your doctor about them. Rest as needed. Drink enough fluid to keep your pee (urine) pale yellow. Sponge yourself or bathe with room-temperature water as needed. This helps to lower your body temperature. Do not use ice water. Do not use too many blankets or wear clothes that are too heavy. If your fever was caused by an infection that spreads from person to person (is contagious), such as a cold or the flu: You should stay home from work and public places for at least 24 hours after your fever is gone. Your fever should be gone for at least 24 hours without the need to use medicines. Contact a doctor if: You throw up (vomit). You cannot eat or drink without throwing up. You have watery poop (diarrhea). It hurts when you pee. Your symptoms do not get  better with treatment. You have new symptoms. You feel very weak. Get help right away if: You are short of breath or have trouble breathing. You are dizzy or you pass out (faint). You feel mixed up (confused). You have signs of not having enough water in your body, such as: Dark pee, very little pee, or no pee. Cracked lips. Dry mouth. Sunken eyes. Sleepiness. Weakness. You have very bad pain in your belly (abdomen). You keep throwing up or having watery poop. You have a rash on your skin. Your symptoms get worse all of a sudden. Summary A fever is an increase in your body's temperature. It often means a temperature of 100.70F (38C) or higher. Watch for any changes in your symptoms. Tell your doctor about them. Take all medicines only as told by your doctor. Do not go to work or other public places if your fever was caused by an illness that can spread to other people. Get help right away if you have signs that you do not have enough water in your body. This information is not intended to replace advice given to you by your health care provider. Make sure you discuss any questions you have with your healthcare provider. Document Revised: 06/25/2017 Document Reviewed: 06/25/2017 Elsevier Patient Education  [place].

## 2020-07-29 ENCOUNTER — Other Ambulatory Visit: Payer: Self-pay

## 2020-07-29 ENCOUNTER — Other Ambulatory Visit: Payer: Medicare Other

## 2020-07-29 LAB — URINALYSIS, ROUTINE W REFLEX MICROSCOPIC
Bilirubin, UA: NEGATIVE
Ketones, UA: NEGATIVE
Leukocytes,UA: NEGATIVE
Nitrite, UA: NEGATIVE
RBC, UA: NEGATIVE
Specific Gravity, UA: 1.015 (ref 1.005–1.030)
Urobilinogen, Ur: 0.2 mg/dL (ref 0.2–1.0)
pH, UA: 6 (ref 5.0–7.5)

## 2020-07-29 LAB — MICROSCOPIC EXAMINATION
Epithelial Cells (non renal): NONE SEEN /hpf (ref 0–10)
RBC, Urine: NONE SEEN /hpf (ref 0–2)
WBC, UA: NONE SEEN /hpf (ref 0–5)

## 2020-07-29 LAB — SARS-COV-2, NAA 2 DAY TAT

## 2020-07-29 LAB — NOVEL CORONAVIRUS, NAA: SARS-CoV-2, NAA: NOT DETECTED

## 2020-07-29 NOTE — Progress Notes (Signed)
Contacted via MyChart   Good evening Nicholas [name], it does not look like urine infection present, if symptoms continue I do recommend coming in for labs as there is glucose in urine and I would like to recheck your diabetes testing once you are feeling better.  Sometimes we see more urinary frequency when patients are diabetic, so would be good to recheck this.  Any questions?

## 2020-07-29 NOTE — Progress Notes (Signed)
Contacted via MyChart   Good afternoon Nicholas [name], Covid testing is negative.  Good news.  Continue with current treatment and if ongoing or worsening please return to see me.  Have a great day!!

## 2020-08-06 ENCOUNTER — Telehealth: Payer: Self-pay

## 2020-08-06 NOTE — Chronic Care Management (AMB) (Signed)
  Care Management   Note  08/06/2020 Name: Nicholas [name] MRN: [id number] DOB: [birth year]  Nicholas [name] is a 75 y.o. year old male who is a primary care patient of Venita Lick, NP and is actively engaged with the care management team. I reached out to Nicholas [name] by phone today to assist with re-scheduling a follow up visit with the Pharmacist  Follow up plan: Telephone appointment with care management team member scheduled for:10/25/2020  Noreene Larsson, [address], [place], [place] [address] Direct Dial: [phone] [email or web address]: [email or web address]

## 2020-08-11 ENCOUNTER — Ambulatory Visit (INDEPENDENT_AMBULATORY_CARE_PROVIDER_SITE_OTHER): Payer: Medicare Other | Admitting: Nurse Practitioner

## 2020-08-11 ENCOUNTER — Other Ambulatory Visit: Payer: Self-pay

## 2020-08-11 ENCOUNTER — Encounter: Payer: Self-pay | Admitting: Nurse Practitioner

## 2020-08-11 VITALS — BP 106/68 | HR 82 | Temp 98.7°F | Ht 71.1 in | Wt 276.4 lb

## 2020-08-11 DIAGNOSIS — I442 Atrioventricular block, complete: Secondary | ICD-10-CM

## 2020-08-11 DIAGNOSIS — R7301 Impaired fasting glucose: Secondary | ICD-10-CM

## 2020-08-11 DIAGNOSIS — I951 Orthostatic hypotension: Secondary | ICD-10-CM

## 2020-08-11 DIAGNOSIS — I480 Paroxysmal atrial fibrillation: Secondary | ICD-10-CM

## 2020-08-11 DIAGNOSIS — Z79899 Other long term (current) drug therapy: Secondary | ICD-10-CM

## 2020-08-11 DIAGNOSIS — F339 Major depressive disorder, recurrent, unspecified: Secondary | ICD-10-CM | POA: Diagnosis not present

## 2020-08-11 DIAGNOSIS — Z950 Presence of cardiac pacemaker: Secondary | ICD-10-CM

## 2020-08-11 DIAGNOSIS — I10 Essential (primary) hypertension: Secondary | ICD-10-CM

## 2020-08-11 DIAGNOSIS — G4733 Obstructive sleep apnea (adult) (pediatric): Secondary | ICD-10-CM

## 2020-08-11 DIAGNOSIS — G894 Chronic pain syndrome: Secondary | ICD-10-CM

## 2020-08-11 DIAGNOSIS — D6869 Other thrombophilia: Secondary | ICD-10-CM

## 2020-08-11 DIAGNOSIS — F419 Anxiety disorder, unspecified: Secondary | ICD-10-CM

## 2020-08-11 DIAGNOSIS — E785 Hyperlipidemia, unspecified: Secondary | ICD-10-CM

## 2020-08-11 DIAGNOSIS — N1831 Chronic kidney disease, stage 3a: Secondary | ICD-10-CM

## 2020-08-11 DIAGNOSIS — G2581 Restless legs syndrome: Secondary | ICD-10-CM

## 2020-08-11 LAB — MICROALBUMIN, URINE WAIVED
Creatinine, Urine Waived: 100 mg/dL (ref 10–300)
Microalb, Ur Waived: 150 mg/L — ABNORMAL HIGH (ref 0–19)

## 2020-08-11 LAB — BAYER DCA HB A1C WAIVED: HB A1C (BAYER DCA - WAIVED): 6.5 % (ref <7.0)

## 2020-08-11 MED ORDER — VENLAFAXINE HCL ER 75 MG PO CP24: 225.0000 mg | ORAL_CAPSULE | Freq: Every day | ORAL | 4 refills | Status: DC

## 2020-08-11 MED ORDER — LORAZEPAM 1 MG PO TABS: 1.0000 mg | ORAL_TABLET | Freq: Every day | ORAL | 0 refills | Status: DC

## 2020-08-11 NOTE — Progress Notes (Signed)
BP 106/68   Pulse 82   Temp 98.7 F (37.1 C) (Oral)   Ht 5' 11.1" (1.806 m)   Wt 276 lb 6.4 oz (125.4 kg)   SpO2 96%   BMI 38.44 kg/m    Subjective:    Patient ID: Nicholas [name], male    DOB: October 14, 1945, 75 y.o.   MRN: [id number]  HPI: Nicholas [name] is a 75 y.o. male  Chief Complaint  Patient presents with   Depression   Diabetes   Hypertension   Wife present at bedside with patient.   HYPERTENSION / HYPERLIPIDEMIA/A-FIB Followed by cardiology and last saw yesterday.  Is scheduled for ablation on 06/24/20.  Last echo in October 2021 noted EF 60-65%.   Saw neurology last 07/22/20 -- continues Midodrine 10 MG TID for orthostatic hypotension and on Florinef 0.1 MG daily -- they are monitoring for autonomic neuropathy and have increase his Requip to 0.25 MG QID.  Has generalized severe sensorimotor polyneuropathy.   Continues on Rosuvastatin, Eliquis, fish oil. Satisfied with current treatment? yes Duration of hypertension: chronic BP monitoring frequency: a few times a week BP range: 100-110/60 BP medication side effects: no Duration of hyperlipidemia: chronic Cholesterol medication side effects: no Cholesterol supplements: fish oil Medication compliance: good compliance Aspirin: no Recent stressors: no Recurrent headaches: no Visual changes: no Palpitations: no Dyspnea: walking up driveway (has a hill) and when getting out of shower to dry off Chest pain: no Lower extremity edema: no Dizzy/lightheaded: occasional -- much improved he reports   IFG: Recent A1c 5.9% January 2022. Improved from previous 6.1%. Polydipsia/polyuria: no Visual disturbance: no Chest pain: no Paresthesias: no    CHRONIC KIDNEY DISEASE CKD status: stable Medications renally dose: yes Previous renal evaluation: no Pneumovax:  Up to Date Influenza Vaccine:  Up to Date   DEPRESSION & CHRONIC PAIN Is on Effexor, Seroquel, and Ativan.  Pt and his wife at bedside made aware of risks of  benzo medication use to include increased sedation, respiratory suppression, falls, extrapyramidal movements, dependence and cardiovascular events.  Pt and his wife would like to continue treatment as benefit determined to outweigh risk.  Discussed with him that he is also on opioid therapy, he takes Ativan at night when he also takes his Norco and his Seroquel.  Discussed risks with taking these three medications together at same time.   He has tried taking 1/2 tablet Ativan, but this does not work well and wishes to maintain current dosing.  Last Ativan fill on PDMP review 07/21/20 and last opioid fill 04/05/20 with recent UDS with pain management on 07/28/20. Saw Dr. Andree Elk on 06/03/20 last.   Duration:stable Anxious mood: yes  Excessive worrying: no Irritability: no  Sweating: no Nausea: no Palpitations:no Hyperventilation: no Panic attacks: no Agoraphobia: no  Obscessions/compulsions: no Depressed mood: no Depression screen [facility] 2/9 08/11/2020 05/12/2020 04/30/2020 02/11/2020 11/10/2019  Decreased Interest 0 0 0 1 1  Down, Depressed, Hopeless 1 0 0 1 1  PHQ - 2 Score 1 0 0 2 2  Altered sleeping 0 0 - 1 0  Tired, decreased energy 3 3 - 1 1  Change in appetite 1 0 - 0 1  Feeling bad or failure about yourself  0 0 - 0 1  Trouble concentrating 0 0 - 0 0  Moving slowly or fidgety/restless 0 0 - 0 1  Suicidal thoughts 0 0 - 0 0  PHQ-9 Score 5 3 - 4 6  Difficult doing work/chores Somewhat difficult - -  Not difficult at all Somewhat difficult  Some recent data might be hidden     Relevant past medical, surgical, family and social history reviewed and updated as indicated. Interim medical history since our last visit reviewed. Allergies and medications reviewed and updated.  Review of Systems  Constitutional:  Negative for activity change, appetite change, fatigue and fever.  Respiratory:  Negative for cough, chest tightness, shortness of breath and wheezing.   Cardiovascular:  Negative for  chest pain, palpitations and leg swelling.  Endocrine: Negative for polydipsia, polyphagia and polyuria.  Musculoskeletal:  Positive for arthralgias.  Neurological: Negative.   Psychiatric/Behavioral:  Negative for decreased concentration, self-injury, sleep disturbance and suicidal ideas. The patient is not nervous/anxious.    Per HPI unless specifically indicated above     Objective:    BP 106/68   Pulse 82   Temp 98.7 F (37.1 C) (Oral)   Ht 5' 11.1" (1.806 m)   Wt 276 lb 6.4 oz (125.4 kg)   SpO2 96%   BMI 38.44 kg/m   Wt Readings from Last 3 Encounters:  08/11/20 276 lb 6.4 oz (125.4 kg)  06/29/20 277 lb (125.6 kg)  06/24/20 277 lb 3.2 oz (125.7 kg)    Physical Exam Vitals and nursing note reviewed.  Constitutional:      General: He is awake. He is not in acute distress.    Appearance: He is well-developed and well-groomed. He is obese.  HENT:     Head: Normocephalic and atraumatic.     Right Ear: Hearing normal. No drainage.     Left Ear: Hearing normal. No drainage.  Eyes:     General: Lids are normal.        Right eye: No discharge.        Left eye: No discharge.     Conjunctiva/sclera: Conjunctivae normal.     Pupils: Pupils are equal, round, and reactive to light.  Neck:     Thyroid: No thyromegaly.     Vascular: No carotid bruit.  Cardiovascular:     Rate and Rhythm: Normal rate and regular rhythm.     Heart sounds: Normal heart sounds, S1 normal and S2 normal. No murmur heard.   No gallop.  Pulmonary:     Effort: Pulmonary effort is normal. No accessory muscle usage or respiratory distress.     Breath sounds: Normal breath sounds.  Abdominal:     General: Bowel sounds are normal.     Palpations: Abdomen is soft.     Tenderness: There is no abdominal tenderness. There is no right CVA tenderness or left CVA tenderness.  Musculoskeletal:        General: Normal range of motion.     Cervical back: Normal range of motion and neck supple.     Right lower  leg: No edema.     Left lower leg: No edema.  Skin:    General: Skin is warm and dry.  Neurological:     Mental Status: He is alert and oriented to person, place, and time.  Psychiatric:        Attention and Perception: Attention normal.        Mood and Affect: Mood normal.        Behavior: Behavior normal. Behavior is cooperative.        Thought Content: Thought content normal.    Results for orders placed or performed in visit on 07/28/20  Novel Coronavirus, NAA ([facility])   Specimen: Nasopharyngeal(NP) swabs in vial transport medium  Result Value Ref Range   SARS-CoV-2, NAA Not Detected Not Detected  SARS-COV-2, NAA 2 DAY TAT  Result Value Ref Range   SARS-CoV-2, NAA 2 DAY TAT Performed   Microscopic Examination   Urine  Result Value Ref Range   WBC, UA None seen 0 - 5 /hpf   RBC None seen 0 - 2 /hpf   Epithelial Cells (non renal) None seen 0 - 10 /hpf   Mucus, UA Present (A) Not Estab.  Urinalysis, Routine w reflex microscopic  Result Value Ref Range   Specific Gravity, UA 1.015 1.005 - 1.030   pH, UA 6.0 5.0 - 7.5   Color, UA Yellow Yellow   Appearance Ur Clear Clear   Leukocytes,UA Negative Negative   Protein,UA 1+ (A) Negative/Trace   Glucose, UA 1+ (A) Negative   Ketones, UA Negative Negative   RBC, UA Negative Negative   Bilirubin, UA Negative Negative   Urobilinogen, Ur 0.2 0.2 - 1.0 mg/dL   Nitrite, UA Negative Negative   Microscopic Examination See below:       Assessment & Plan:   Problem List Items Addressed This Visit       Cardiovascular and Mediastinum   HTN (hypertension) (Chronic)    Chronic, ongoing.  Followed by cardiology.  BP below goal at home and in office.  With orthostatic BP presenting occasionally will continue current regimen at this time and have recommended utilizing compression hose at home, agrees to try this.  Continue collaboration with cardiology + neurology and current medication regimen.  Labs today.  Recommend ensuring good  fluid intake at home.  Return in 3 months.       Relevant Orders   Basic metabolic panel   Microalbumin, Urine Waived   TSH   PAF (paroxysmal atrial fibrillation) (HCC) (Chronic)    Chronic, stable, followed by cardiology.  Continue current medication regimen as prescribed by them.  Labs up to date and ablation last on 05/17/20.       Chronic orthostatic hypotension    Continue collaboration with cardiology + neurology and medication regimen as prescribed by them.  Recommend ensuring good hydration throughout daytime hours + use of compression hose at home (on during daytime and off during night), agrees to try them.       Complete heart block ([place])    Followed by cardiology.  Pacemaker checks by them.          Respiratory   OSA (obstructive sleep apnea)    New diagnosis and awaiting CPAP equipment, discussed need to consistently use once obtained.         Endocrine   IFG (impaired fasting glucose) (Chronic)    Noted on past labs -- A1c today upward trend to 6.5% and urine ALB 150.  Concern for T2DM and discussed with him at length options at this time, he wishes to focus on diet vs adding medication at this time, which provider agrees with since already on multiple medications.  Return in 3 months.       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         Other   Dyslipidemia (Chronic)    Chronic, ongoing.  Continue current medication regimen and adjust as needed. Lipid panel today.       Relevant Orders  Lipid Panel w/o Chol/HDL Ratio   Anxiety (Chronic)    Chronic, ongoing.  Discussed at length risk of benzo and opioid use in conjunction with each other.  Recommend he not take the two together at same hour during day or evening.  He tried to cut back to 1/2 tablet but was unsuccessful in past.    Continue to collaborate with CCM team on education.  Provided wife and him with copy of [facility] ben[facility] risk information patient sheet in past.  They request 90 day supply, as previous PCP supplied this.  Are aware he does have to return every 3 months for refills.  UDS up to date with pain management in August 2021.  Refills sent.  Controlled substance contract next visit.       Relevant Medications   LORazepam (ATIVAN) 1 MG tablet   venlafaxine XR (EFFEXOR XR) 75 MG 24 hr capsule   Chronic pain syndrome (Chronic)    Chronic, ongoing followed by pain clinic.  Discussed at length risk of benzo and opioid use in conjunction with each other.  Recommend he not take the two together at same hour during day or evening.         Relevant Medications   venlafaxine XR (EFFEXOR XR) 75 MG 24 hr capsule   Long-term current use of benzodiazepine (Chronic)    Has been on Ativan for many years, with trial of cutting back unsuccessful.  At length discussions about risk of opioid and benzo use had with patient and wife by both PCP and CCM PharmD.  Continue these discussions.  Refer to anxiety plan.       Morbid obesity (HCC) (Chronic)    BMI 38.44 with HTN, A-Fib, Heart Block, and CKD.  Recommended eating smaller high protein, low fat meals more frequently and exercising 30 mins a day 5 times a week with a goal of 10-15lb weight loss in the next 3 months. Patient voiced their understanding and motivation to adhere to these recommendations.        Pacemaker - MDT (Chronic)    Regular checks with cardiology.       Restless leg syndrome (Chronic)    Followed by neurology, continue current medication regimen as prescribed by them.       Depression, recurrent ([place]) - Primary    Chronic, ongoing.  Denies SI/HI.  Continue current medication regimen and adjust as needed.  Would benefit from trial off Effexor and trial of SSRI, but refuses this.         Relevant Medications   LORazepam (ATIVAN) 1 MG tablet    venlafaxine XR (EFFEXOR XR) 75 MG 24 hr capsule   Secondary hypercoagulable state ([place])    On Eliquis with A-fib.  Continue to monitor closely for bleeding or increased bruising.  CBC annually.         Follow up plan: Return in about 3 months (around 11/11/2020) for PREDIABETES, HTN/HLD/A-FIB, MOOD, CKD.

## 2020-08-11 NOTE — Assessment & Plan Note (Signed)
Followed by cardiology.  Pacemaker checks by them.

## 2020-08-11 NOTE — Assessment & Plan Note (Signed)
Regular checks with cardiology. 

## 2020-08-11 NOTE — Assessment & Plan Note (Signed)
Chronic, ongoing followed by pain clinic.  Discussed at length risk of benzo and opioid use in conjunction with each other.  Recommend he not take the two together at same hour during day or evening.

## 2020-08-11 NOTE — Assessment & Plan Note (Signed)
Noted on past labs -- A1c today upward trend to 6.5% and urine ALB 150.  Concern for T2DM and discussed with him at length options at this time, he wishes to focus on diet vs adding medication at this time, which provider agrees with since already on multiple medications.  Return in 3 months.

## 2020-08-11 NOTE — Assessment & Plan Note (Signed)
Chronic, ongoing.  Followed by cardiology.  BP below goal at home and in office.  With orthostatic BP presenting occasionally will continue current regimen at this time and have recommended utilizing compression hose at home, agrees to try this.  Continue collaboration with cardiology + neurology and current medication regimen.  Labs today.  Recommend ensuring good fluid intake at home.  Return in 3 months.

## 2020-08-11 NOTE — Assessment & Plan Note (Signed)
Chronic, ongoing.  Denies SI/HI.  Continue current medication regimen and adjust as needed.  Would benefit from trial off Effexor and trial of SSRI, but refuses this.   

## 2020-08-11 NOTE — Assessment & Plan Note (Signed)
New diagnosis and awaiting CPAP equipment, discussed need to consistently use once obtained.

## 2020-08-11 NOTE — Assessment & Plan Note (Signed)
Chronic, ongoing.  Discussed at length risk of benzo and opioid use in conjunction with each other.  Recommend he not take the two together at same hour during day or evening.  He tried to cut back to 1/2 tablet but was unsuccessful in past.   Continue to collaborate with CCM team on education.  Provided wife and him with copy of [facility] benzo risk information patient sheet in past.  They request 90 day supply, as previous PCP supplied this.  Are aware he does have to return every 3 months for refills.  UDS up to date with pain management in August 2021.  Refills sent.  Controlled substance contract next visit.

## 2020-08-11 NOTE — Assessment & Plan Note (Signed)
On Eliquis with A-fib.  Continue to monitor closely for bleeding or increased bruising.  CBC annually. 

## 2020-08-11 NOTE — Assessment & Plan Note (Signed)
BMI 38.44 with HTN, A-Fib, Heart Block, and CKD.  Recommended eating smaller high protein, low fat meals more frequently and exercising 30 mins a day 5 times a week with a goal of 10-15lb weight loss in the next 3 months. Patient voiced their understanding and motivation to adhere to these recommendations.

## 2020-08-11 NOTE — Assessment & Plan Note (Signed)
Continue collaboration with cardiology + neurology and medication regimen as prescribed by them.  Recommend ensuring good hydration throughout daytime hours + use of compression hose at home (on during daytime and off during night), agrees to try them.

## 2020-08-11 NOTE — Assessment & Plan Note (Signed)
Chronic, stable, followed by cardiology.  Continue current medication regimen as prescribed by them.  Labs up to date and ablation last on 05/17/20.

## 2020-08-11 NOTE — Assessment & Plan Note (Signed)
Chronic, ongoing.  Continue current medication regimen and adjust as needed. Lipid panel today. 

## 2020-08-11 NOTE — Assessment & Plan Note (Signed)
Followed by neurology, continue current medication regimen as prescribed by them. 

## 2020-08-11 NOTE — Assessment & Plan Note (Addendum)
Ongoing stage 3a, recheck BMP today, current up to date, and adjust medications as needed.

## 2020-08-11 NOTE — Assessment & Plan Note (Signed)
Has been on Ativan for many years, with trial of cutting back unsuccessful.  At length discussions about risk of opioid and benzo use had with patient and wife by both PCP and CCM PharmD.  Continue these discussions.  Refer to anxiety plan. 

## 2020-08-11 NOTE — Patient Instructions (Signed)
Prediabetes Eating Plan °Prediabetes is a condition that causes blood sugar (glucose) levels to be higher than normal. This increases the risk for developing type 2 diabetes (type 2 diabetes mellitus). Working with a health care provider or nutrition specialist (dietitian) to make diet and lifestyle changes can help prevent the onset of diabetes. These changes may help you: °Control your blood glucose levels. °Improve your cholesterol levels. °Manage your blood pressure. °What are tips for following this plan? °Reading food labels °Read food labels to check the amount of fat, salt (sodium), and sugar in prepackaged foods. Avoid foods that have: °Saturated fats. °Trans fats. °Added sugars. °Avoid foods that have more than 300 milligrams (mg) of sodium per serving. Limit your sodium intake to less than 2,300 mg each day. °Shopping °Avoid buying pre-made and processed foods. °Avoid buying drinks with added sugar. °Cooking °Cook with olive oil. Do not use butter, lard, or ghee. °Bake, broil, grill, steam, or boil foods. Avoid frying. °Meal planning ° °Work with your dietitian to create an eating plan that is right for you. This may include tracking how many calories you take in each day. Use a food diary, notebook, or mobile application to track what you eat at each meal. °Consider following a Mediterranean diet. This includes: °Eating several servings of fresh fruits and vegetables each day. °Eating fish at least twice a week. °Eating one serving each day of whole grains, beans, nuts, and seeds. °Using olive oil instead of other fats. °Limiting alcohol. °Limiting red meat. °Using nonfat or low-fat dairy products. °Consider following a plant-based diet. This includes dietary choices that focus on eating mostly vegetables and fruit, grains, beans, nuts, and seeds. °If you have high blood pressure, you may need to limit your sodium intake or follow a diet such as the DASH (Dietary Approaches to Stop Hypertension) eating  plan. The DASH diet aims to lower high blood pressure. °Lifestyle °Set weight loss goals with help from your health care team. It is recommended that most people with prediabetes lose 7% of their body weight. °Exercise for at least 30 minutes 5 or more days a week. °Attend a support group or seek support from a mental health counselor. °Take over-the-counter and prescription medicines only as told by your health care provider. °What foods are recommended? °Fruits °Berries. Bananas. Apples. Oranges. Grapes. Papaya. Mango. Pomegranate. Kiwi. Grapefruit. Cherries. °Vegetables °Lettuce. Spinach. Peas. Beets. Cauliflower. Cabbage. Broccoli. Carrots. Tomatoes. Squash. Eggplant. Herbs. Peppers. Onions. Cucumbers. Brussels sprouts. °Grains °Whole grains, such as whole-wheat or whole-grain breads, crackers, cereals, and pasta. Unsweetened oatmeal. Bulgur. Barley. Quinoa. Brown rice. Corn or whole-wheat flour tortillas or taco shells. °Meats and other proteins °Seafood. Poultry without skin. Lean cuts of pork and beef. Tofu. Eggs. Nuts. Beans. °Dairy °Low-fat or fat-free dairy products, such as yogurt, cottage cheese, and cheese. °Beverages °Water. Tea. Coffee. Sugar-free or diet soda. Seltzer water. Low-fat or nonfat milk. Milk alternatives, such as soy or almond milk. °Fats and oils °Olive oil. Canola oil. Sunflower oil. Grapeseed oil. Avocado. Walnuts. °Sweets and desserts °Sugar-free or low-fat pudding. Sugar-free or low-fat ice cream and other frozen treats. °Seasonings and condiments °Herbs. Sodium-free spices. Mustard. Relish. Low-salt, low-sugar ketchup. Low-salt, low-sugar barbecue sauce. Low-fat or fat-free mayonnaise. °The items listed above may not be a complete list of recommended foods and beverages. Contact a dietitian for more information. °What foods are not recommended? °Fruits °Fruits canned with syrup. °Vegetables °Canned vegetables. Frozen vegetables with butter or cream sauce. °Grains °Refined white  flour and flour   products, such as bread, pasta, snack foods, and cereals. °Meats and other proteins °Fatty cuts of meat. Poultry with skin. Breaded or fried meat. Processed meats. °Dairy °Full-fat yogurt, cheese, or milk. °Beverages °Sweetened drinks, such as iced tea and soda. °Fats and oils °Butter. Lard. Ghee. °Sweets and desserts °Baked goods, such as cake, cupcakes, pastries, cookies, and cheesecake. °Seasonings and condiments °Spice mixes with added salt. Ketchup. Barbecue sauce. Mayonnaise. °The items listed above may not be a complete list of foods and beverages that are not recommended. Contact a dietitian for more information. °Where to find more information °American Diabetes Association: www.diabetes.org °Summary °You may need to make diet and lifestyle changes to help prevent the onset of diabetes. These changes can help you control blood sugar, improve cholesterol levels, and manage blood pressure. °Set weight loss goals with help from your health care team. It is recommended that most people with prediabetes lose 7% of their body weight. °Consider following a Mediterranean diet. This includes eating plenty of fresh fruits and vegetables, whole grains, beans, nuts, seeds, fish, and low-fat dairy, and using olive oil instead of other fats. °This information is not intended to replace advice given to you by your health care provider. Make sure you discuss any questions you have with your health care provider. °Document Revised: 04/10/2019 Document Reviewed: 04/10/2019 °Elsevier Patient Education © 2022 [employer] ° °

## 2020-08-12 LAB — BASIC METABOLIC PANEL
BUN/Creatinine Ratio: 14 (ref 10–24)
BUN: 16 mg/dL (ref 8–27)
CO2: 22 mmol/L (ref 20–29)
Calcium: 9.6 mg/dL (ref 8.6–10.2)
Chloride: 100 mmol/L (ref 96–106)
Creatinine, Ser: 1.18 mg/dL (ref 0.76–1.27)
Glucose: 146 mg/dL — ABNORMAL HIGH (ref 65–99)
Potassium: 4.6 mmol/L (ref 3.5–5.2)
Sodium: 140 mmol/L (ref 134–144)
eGFR: 64 mL/min/{1.73_m2} (ref >59)

## 2020-08-12 LAB — LIPID PANEL W/O CHOL/HDL RATIO
Cholesterol, Total: 140 mg/dL (ref 100–199)
HDL: 54 mg/dL (ref >39)
LDL Chol Calc (NIH): 65 mg/dL (ref 0–99)
Triglycerides: 118 mg/dL (ref 0–149)
VLDL Cholesterol Cal: 21 mg/dL (ref 5–40)

## 2020-08-12 LAB — TSH: TSH: 2.18 u[IU]/mL (ref 0.450–4.500)

## 2020-08-12 NOTE — Progress Notes (Signed)
Contacted via MyChart    Good morning Nicholas [name], your labs have returned and kidney function has improved to normal levels this check (creatinine and eGFR) + liver function is normal.  Glucose (sugar) was elevated, please focus on diet changes, as goal to get this A1c <6.5%.  Cholesterol levels stable and thyroid normal.  Any questions? Keep being amazing!!  Thank you for allowing me to participate in your care.  I appreciate you. Kindest regards, Teodora Baumgarten

## 2020-08-16 ENCOUNTER — Telehealth: Payer: Self-pay

## 2020-08-17 NOTE — Progress Notes (Signed)
Electrophysiology Office Follow up Visit Note:    Date:  08/18/2020   ID:  Nicholas [name], DOB 07-04-1945, MRN [id number]  PCP:  Nicholas Lick, NP  [facility] Cardiologist:  None  [facility] Electrophysiologist:  Nicholas Epley, MD    Interval History:    Nicholas [name] is a 75 y.o. male who presents for a follow up visit after an atrial fibrillation ablation on May 17, 2020.  He has previously failed flecainide and propafenone.  He is on Eliquis for stroke prophylaxis.  He saw Nicholas Peals, PA-C on June 24, 2020.  At that appointment he reported doing well after the A. fib ablation with more energy.  Since that time, he is seen Nicholas [name] in clinic.  Today he is with his wife in clinic who I have previously met.  He tells me he has been doing well since the ablation procedure.  Groin sites of healed well.  Significant decrease in the burden of atrial fibrillation since the ablation procedure.  He is struggling to get outside to do much exercise given the heat but plans to increase this as the cooler temperatures return in the fall.  He is still taking his Eliquis for stroke prophylaxis without bleeding issues.   Past Medical History:  Diagnosis Date   Anterior urethral stricture    Anxiety    Arthritis    a. knees, hips, hands;  b. 11/2013 s/p L TKA @ [facility].   Bile reflux gastritis    Bulging lumbar disc    BXO (balanitis xerotica obliterans)    Complete heart block ([place])    a. s/p MDT dual chamber (His bundle) pacemaker 01/2016 Dr Caryl Comes   Depression    DVT (deep venous thrombosis) ([place])    Erosive esophagitis    Gross hematuria    Hyperlipemia    Hypertension    borderline   Internal hemorrhoids    Phimosis    Pulmonary embolism ([place])     Past Surgical History:  Procedure Laterality Date   ATRIAL FIBRILLATION ABLATION N/A 05/17/2020   Procedure: ATRIAL FIBRILLATION ABLATION;  Surgeon: Nicholas Epley, MD;  Location: [facility];  Service:  Cardiovascular;  Laterality: N/A;   BUNIONECTOMY Bilateral 01/06/2015   Procedure: Lillard Anes;  Surgeon: [name], MD;  Location: [facility] ORS;  Service: Orthopedics;  Laterality: Bilateral;   CARDIAC CATHETERIZATION  ~ 2005   "once"   CATARACT EXTRACTION W/ INTRAOCULAR LENS  IMPLANT, BILATERAL Bilateral ~ 2010   COLONOSCOPY WITH PROPOFOL N/A 12/07/2015   Procedure: COLONOSCOPY WITH PROPOFOL;  Surgeon: Lollie Sails, MD;  Location: [facility] ENDOSCOPY;  Service: Endoscopy;  Laterality: N/A;   EP IMPLANTABLE DEVICE N/A 02/16/2016   MDT dual chamber (His Bundle) pacemaker implanted by Dr Caryl Comes for intermittent complete heart block   ESOPHAGOGASTRODUODENOSCOPY (EGD) WITH PROPOFOL N/A 12/07/2015   Procedure: ESOPHAGOGASTRODUODENOSCOPY (EGD) WITH PROPOFOL;  Surgeon: Lollie Sails, MD;  Location: [facility] ENDOSCOPY;  Service: Endoscopy;  Laterality: N/A;   ESOPHAGOGASTRODUODENOSCOPY (EGD) WITH PROPOFOL N/A 05/17/2017   Procedure: ESOPHAGOGASTRODUODENOSCOPY (EGD) WITH PROPOFOL;  Surgeon: Lollie Sails, MD;  Location: [facility] ENDOSCOPY;  Service: Endoscopy;  Laterality: N/A;   HAMMER TOE SURGERY Bilateral 01/06/2015   Procedure: HAMMER TOE CORRECTION;  Surgeon: [name], MD;  Location: [facility] ORS;  Service: Orthopedics;  Laterality: Bilateral;   KNEE CARTILAGE SURGERY Left 1965   "football injury"   Left Total Knee Arthroplasty     a. 11/2013 [facility].   PILONIDAL CYST EXCISION  1970's   TOTAL HIP ARTHROPLASTY Right 2004   TOTAL HIP ARTHROPLASTY Left 2006   UPPER GI ENDOSCOPY      Current Medications: Current Meds  Medication Sig   acetaminophen (TYLENOL) 650 MG CR tablet Take 650 mg by mouth every 8 (eight) hours as needed for pain.   Ascorbic Acid (VITAMIN C) 1000 MG tablet Take 1,000 mg by mouth daily.   Cranberry 500 MG CAPS Take 500 mg by mouth daily.   ELIQUIS 5 MG TABS tablet TAKE 1 TABLET BY MOUTH TWICE A DAY   Ferrous Gluconate (IRON 27 PO) Take 27 mg by mouth daily.    fludrocortisone (FLORINEF) 0.1 MG tablet Take 0.1 mg by mouth daily.   gabapentin (NEURONTIN) 600 MG tablet Take 1 tablet (600 mg total) by mouth at bedtime.   hyoscyamine (LEVSIN, ANASPAZ) 0.125 MG tablet Take 0.125 mg by mouth every 6 (six) hours as needed for bladder spasms or cramping.    LORazepam (ATIVAN) 1 MG tablet Take 1 tablet (1 mg total) by mouth at bedtime.   Magnesium 400 MG TABS Take 400 mg by mouth daily.    methocarbamol (ROBAXIN) 500 MG tablet Take 1 tablet (500 mg total) by mouth 2 (two) times daily as needed for muscle spasms.   midodrine (PROAMATINE) 10 MG tablet TAKE 1 TABLET BY MOUTH THREE TIMES A DAY   Multiple Vitamin (MULTIVITAMIN WITH MINERALS) TABS tablet Take 1 tablet by mouth daily. Centrum Silver   nystatin (MYCOSTATIN/NYSTOP) powder Apply 1 application topically 3 (three) times daily.   Omega-3 Fatty Acids (FISH OIL) 1200 MG CAPS Take 1,200 mg by mouth daily.   oxymetazoline (AFRIN) 0.05 % nasal spray Place 2 sprays into both nostrils at bedtime.   pantoprazole (PROTONIX) 20 MG tablet Take 20 mg by mouth daily.   potassium chloride SA (KLOR-CON) 20 MEQ tablet Take 2 tablets (40 meq) x 1 dose as directed (Patient taking differently: Take 40 mEq by mouth daily as needed (with torsemide (fluid retention)).)   QUEtiapine Fumarate (SEROQUEL XR) 150 MG 24 hr tablet TAKE 1 TABLET (150 MG TOTAL) BY MOUTH AT BEDTIME.   rOPINIRole (REQUIP) 0.25 MG tablet Take 0.25 mg by mouth 4 (four) times daily.   rosuvastatin (CRESTOR) 40 MG tablet Take 1 tablet (40 mg total) by mouth daily.   solifenacin (VESICARE) 10 MG tablet Take 1 tablet (10 mg total) by mouth daily.   torsemide (DEMADEX) 20 MG tablet Take 40 mg by mouth daily as needed (fluid retention).   triamcinolone cream (KENALOG) 0.1 % Apply 1 application topically 2 (two) times daily as needed (skin irritation).   venlafaxine XR (EFFEXOR XR) 75 MG 24 hr capsule Take 3 capsules (225 mg total) by mouth daily.     Allergies:    Patient has no known allergies.   Social History   Socioeconomic History   Marital status: Married    Spouse name: Not on file   Number of children: Not on file   Years of education: Not on file   Highest education level: High school graduate  Occupational History   Occupation: retired   Tobacco Use   Smoking status: Never   Smokeless tobacco: Never  Vaping Use   Vaping Use: Never used  Substance and Sexual Activity   Alcohol use: Yes    Alcohol/week: 1.0 standard drink    Types: 1 Cans of beer per week    Comment: beers seldom    Drug use: No   Sexual activity: Not  Currently  Other Topics Concern   Not on file  Social History Narrative   Lives in [place] with wife.  Does not routinely exercise.  Activity severely limited by bilateral knee pain.   Social Determinants of Health   Financial Resource Strain: Low Risk    Difficulty of Paying Living Expenses: Not hard at all  Food Insecurity: No Food Insecurity   Worried About Charity fundraiser in the Last Year: Never true   [place] in the Last Year: Never true  Transportation Needs: No Transportation Needs   Lack of Transportation (Medical): No   Lack of Transportation (Non-Medical): No  Physical Activity: Inactive   Days of Exercise per Week: 0 days   Minutes of Exercise per Session: 0 min  Stress: No Stress Concern Present   Feeling of Stress : Not at all  Social Connections: Not on file     Family History: The patient's family history includes Alcoholism in his mother; Asthma in his son; Breast cancer in his sister; Cancer in his mother; Diabetes in his daughter and father; Glaucoma in his sister; Hypertension in his father; Prostate cancer in his paternal grandfather; Stroke in his father and paternal grandmother.  ROS:   Please see the history of present illness.    All other systems reviewed and are negative.  EKGs/Labs/Other Studies Reviewed:    The following studies were reviewed today:  Jun 15, 2020 device interrogation personally reviewed Significant decrease in the burden of atrial fibrillation since ablation.   EKG:  The ekg ordered today demonstrates a sensed V pace with his capture   August 18, 2020 device interrogation in clinic personally reviewed Ventricular pacing 99.8% 0.7% burden of A. tach/A. fib, significantly decreased since ablation. Lead parameters stable No changes made today   Recent Labs: 11/10/2019: Magnesium 2.3 11/22/2019: ALT 19 05/11/2020: Hemoglobin 15.5; Platelets 168 08/11/2020: BUN 16; Creatinine, Ser 1.18; Potassium 4.6; Sodium 140; TSH 2.180  Recent Lipid Panel    Component Value Date/Time   CHOL 140 08/11/2020 0843   CHOL 198 01/03/2018 1610   TRIG 118 08/11/2020 0843   TRIG 264 (H) 01/03/2018 1610   HDL 54 08/11/2020 0843   CHOLHDL 3.6 07/16/2018 0813   VLDL 53 (H) 01/03/2018 1610   LDLCALC 65 08/11/2020 0843    Physical Exam:    VS:  BP 122/80 (BP Location: Left Arm, Patient Position: Sitting, Cuff Size: Normal)   Pulse 88   Ht '5\' 11"'  (1.803 m)   Wt 274 lb (124.3 kg)   SpO2 93%   BMI 38.22 kg/m     Wt Readings from Last 3 Encounters:  08/18/20 274 lb (124.3 kg)  08/11/20 276 lb 6.4 oz (125.4 kg)  06/29/20 277 lb (125.6 kg)     GEN:  Well nourished, well developed in no acute distress HEENT: Normal NECK: No JVD; No carotid bruits LYMPHATICS: No lymphadenopathy CARDIAC: RRR, no murmurs, rubs, gallops RESPIRATORY:  Clear to auscultation without rales, wheezing or rhonchi  ABDOMEN: Soft, non-tender, non-distended MUSCULOSKELETAL:  No edema; No deformity  SKIN: Warm and dry NEUROLOGIC:  Alert and oriented x 3 PSYCHIATRIC:  Normal affect   ASSESSMENT:    1. Persistent atrial fibrillation ([place])   2. Complete heart block ([place])   3. Sleep-disordered breathing   4. Cardiac pacemaker in situ    PLAN:    In order of problems listed above:   1. Persistent atrial fibrillation ([place]) Doing well after ablation procedure.  Overall very pleased with the results.  Continue Eliquis for stroke prophylaxis.  Routine follow-up with Dr. Caryl Comes.  I am happy to see as needed.  2. Complete heart block [facility]) Device interrogation shows a well-functioning device.  Continue remote monitoring.  3. Sleep-disordered breathing He is waiting for the CPAP machine.  4. Cardiac pacemaker in situ Functioning well.  Interrogation as above.  Continue remote monitoring.   Follow-up with Dr. Caryl Comes.  I am happy to see as needed.    Medication Adjustments/Labs and Tests Ordered: Current medicines are reviewed at length with the patient today.  Concerns regarding medicines are outlined above.  Orders Placed This Encounter  Procedures   EKG 12-Lead   No orders of the defined types were placed in this encounter.    Signed, Lars Mage, MD, [facility], [facility] 08/18/2020 10:32 AM    Electrophysiology [facility]

## 2020-08-18 ENCOUNTER — Encounter: Payer: Self-pay | Admitting: Cardiology

## 2020-08-18 ENCOUNTER — Other Ambulatory Visit: Payer: Self-pay

## 2020-08-18 ENCOUNTER — Ambulatory Visit (INDEPENDENT_AMBULATORY_CARE_PROVIDER_SITE_OTHER): Payer: Medicare Other | Admitting: Cardiology

## 2020-08-18 VITALS — BP 122/80 | HR 88 | Ht 71.0 in | Wt 274.0 lb

## 2020-08-18 DIAGNOSIS — G4730 Sleep apnea, unspecified: Secondary | ICD-10-CM | POA: Diagnosis not present

## 2020-08-18 DIAGNOSIS — I4819 Other persistent atrial fibrillation: Secondary | ICD-10-CM

## 2020-08-18 DIAGNOSIS — I442 Atrioventricular block, complete: Secondary | ICD-10-CM

## 2020-08-18 DIAGNOSIS — Z950 Presence of cardiac pacemaker: Secondary | ICD-10-CM

## 2020-08-18 NOTE — Patient Instructions (Addendum)
Medication Instructions:  Your physician recommends that you continue on your current medications as directed. Please refer to the Current Medication list given to you today. *If you need a refill on your cardiac medications before your next appointment, please call your pharmacy*  Lab Work: None ordered. If you have labs (blood work) drawn today and your tests are completely normal, you will receive your results only by: [place] (if you have MyChart) OR A paper copy in the mail If you have any lab test that is abnormal or we need to change your treatment, we will call you to review the results.  Testing/Procedures: None ordered.  Follow-Up: At [facility], you and your health needs are our priority.  As part of our continuing mission to provide you with exceptional heart care, we have created designated Provider Care Teams.  These Care Teams include your primary Cardiologist (physician) and Advanced Practice Providers (APPs -  Physician Assistants and Nurse Practitioners) who all work together to provide you with the care you need, when you need it.  Your next appointment:   Your physician wants you to follow-up in: as needed with Dr. Quentin Ore.     Follow up with Dr. Caryl Comes as scheduled.  Remote monitoring is used to monitor your Pacemaker from home. This monitoring reduces the number of office visits required to check your device to one time per year. It allows [place] to keep an eye on the functioning of your device to ensure it is working properly. You are scheduled for a device check from home on 09/13/2020. You may send your transmission at any time that day. If you have a wireless device, the transmission will be sent automatically. After your physician reviews your transmission, you will receive a postcard with your next transmission date.

## 2020-08-25 ENCOUNTER — Encounter: Payer: Self-pay | Admitting: Anesthesiology

## 2020-08-25 ENCOUNTER — Ambulatory Visit: Payer: Medicare Other | Attending: Anesthesiology | Admitting: Anesthesiology

## 2020-08-25 ENCOUNTER — Other Ambulatory Visit: Payer: Self-pay

## 2020-08-25 DIAGNOSIS — M79641 Pain in right hand: Secondary | ICD-10-CM

## 2020-08-25 DIAGNOSIS — F1190 Opioid use, unspecified, uncomplicated: Secondary | ICD-10-CM | POA: Diagnosis not present

## 2020-08-25 DIAGNOSIS — M5431 Sciatica, right side: Secondary | ICD-10-CM

## 2020-08-25 DIAGNOSIS — M5416 Radiculopathy, lumbar region: Secondary | ICD-10-CM

## 2020-08-25 DIAGNOSIS — M62830 Muscle spasm of back: Secondary | ICD-10-CM

## 2020-08-25 DIAGNOSIS — M47816 Spondylosis without myelopathy or radiculopathy, lumbar region: Secondary | ICD-10-CM

## 2020-08-25 DIAGNOSIS — M5136 Other intervertebral disc degeneration, lumbar region: Secondary | ICD-10-CM

## 2020-08-25 DIAGNOSIS — G894 Chronic pain syndrome: Secondary | ICD-10-CM | POA: Diagnosis not present

## 2020-08-25 DIAGNOSIS — M79642 Pain in left hand: Secondary | ICD-10-CM

## 2020-08-25 DIAGNOSIS — M51369 Other intervertebral disc degeneration, lumbar region without mention of lumbar back pain or lower extremity pain: Secondary | ICD-10-CM

## 2020-08-25 DIAGNOSIS — M5432 Sciatica, left side: Secondary | ICD-10-CM

## 2020-08-25 MED ORDER — HYDROCODONE-ACETAMINOPHEN 5-325 MG PO TABS: 1.0000 | ORAL_TABLET | Freq: Two times a day (BID) | ORAL | 0 refills | Status: AC

## 2020-08-25 MED ORDER — HYDROCODONE-ACETAMINOPHEN 5-325 MG PO TABS: 1.0000 | ORAL_TABLET | Freq: Two times a day (BID) | ORAL | 0 refills | Status: DC

## 2020-08-25 NOTE — Progress Notes (Signed)
Virtual Visit via Telephone Note  I connected with Nicholas [name] on 08/25/20 at  2:45 PM EDT by telephone and verified that I am speaking with the correct person using two identifiers.  Location: Patient: Home Provider: Pain control center   I discussed the limitations, risks, security and privacy concerns of performing an evaluation and management service by telephone and the availability of in person appointments. I also discussed with the patient that there may be a patient responsible charge related to this service. The patient expressed understanding and agreed to proceed.   History of Present Illness: I spoke with Nicholas [name] via telephone for the pain management meeting.  He was unable to do the video portion of the virtual conference.  He reports that he is doing well in regards to his low back pain.  He still is down on his overall energy following his cardiac ablation but otherwise is doing reasonably well.  The quality characteristic and distribution of his low back pain are otherwise stable.  He takes his hydrocodone twice a day and this continues to work well for him.  No other changes are noted.  Bowel bladder function and lower extremity strength and function also are at baseline.  He generally gets about 75% improvement in his low back symptoms and leg symptoms when he takes his medication lasting for about 4 to 6 hours and he is doing this twice a day.  Review of systems: General: No fevers or chills Pulmonary: No shortness of breath or dyspnea Cardiac: No angina or palpitations or lightheadedness GI: No abdominal pain or constipation Psych: No depression    Observations/Objective:  Current Outpatient Medications:    HYDROcodone-acetaminophen (NORCO/VICODIN) 5-325 MG tablet, Take 1 tablet by mouth 2 (two) times daily., Disp: 60 tablet, Rfl: 0   [START ON 09/24/2020] HYDROcodone-acetaminophen (NORCO/VICODIN) 5-325 MG tablet, Take 1 tablet by mouth 2 (two) times daily., Disp:  60 tablet, Rfl: 0   acetaminophen (TYLENOL) 650 MG CR tablet, Take 650 mg by mouth every 8 (eight) hours as needed for pain., Disp: , Rfl:    Ascorbic Acid (VITAMIN C) 1000 MG tablet, Take 1,000 mg by mouth daily., Disp: , Rfl:    Cranberry 500 MG CAPS, Take 500 mg by mouth daily., Disp: , Rfl:    ELIQUIS 5 MG TABS tablet, TAKE 1 TABLET BY MOUTH TWICE A DAY, Disp: 60 tablet, Rfl: 14   Ferrous Gluconate (IRON 27 PO), Take 27 mg by mouth daily., Disp: , Rfl:    fludrocortisone (FLORINEF) 0.1 MG tablet, Take 0.1 mg by mouth daily., Disp: , Rfl:    gabapentin (NEURONTIN) 600 MG tablet, Take 1 tablet (600 mg total) by mouth at bedtime., Disp: 90 tablet, Rfl: 4   hyoscyamine (LEVSIN, ANASPAZ) 0.125 MG tablet, Take 0.125 mg by mouth every 6 (six) hours as needed for bladder spasms or cramping. , Disp: , Rfl:    LORazepam (ATIVAN) 1 MG tablet, Take 1 tablet (1 mg total) by mouth at bedtime., Disp: 90 tablet, Rfl: 0   Magnesium 400 MG TABS, Take 400 mg by mouth daily. , Disp: , Rfl:    methocarbamol (ROBAXIN) 500 MG tablet, Take 1 tablet (500 mg total) by mouth 2 (two) times daily as needed for muscle spasms., Disp: 60 tablet, Rfl: 2   midodrine (PROAMATINE) 10 MG tablet, TAKE 1 TABLET BY MOUTH THREE TIMES A DAY, Disp: 270 tablet, Rfl: 3   Multiple Vitamin (MULTIVITAMIN WITH MINERALS) TABS tablet, Take 1 tablet by mouth  daily. Centrum Silver, Disp: , Rfl:    nystatin (MYCOSTATIN/NYSTOP) powder, Apply 1 application topically 3 (three) times daily., Disp: 30 g, Rfl: 1   Omega-3 Fatty Acids (FISH OIL) 1200 MG CAPS, Take 1,200 mg by mouth daily., Disp: , Rfl:    oxymetazoline (AFRIN) 0.05 % nasal spray, Place 2 sprays into both nostrils at bedtime., Disp: , Rfl:    pantoprazole (PROTONIX) 20 MG tablet, Take 20 mg by mouth daily., Disp: , Rfl:    potassium chloride SA (KLOR-CON) 20 MEQ tablet, Take 2 tablets (40 meq) x 1 dose as directed (Patient taking differently: Take 40 mEq by mouth daily as needed (with  torsemide (fluid retention)).), Disp: 10 tablet, Rfl: 0   QUEtiapine Fumarate (SEROQUEL XR) 150 MG 24 hr tablet, TAKE 1 TABLET (150 MG TOTAL) BY MOUTH AT BEDTIME., Disp: 90 tablet, Rfl: 4   rOPINIRole (REQUIP) 0.25 MG tablet, Take 0.25 mg by mouth 4 (four) times daily., Disp: , Rfl:    rosuvastatin (CRESTOR) 40 MG tablet, Take 1 tablet (40 mg total) by mouth daily., Disp: 90 tablet, Rfl: 4   solifenacin (VESICARE) 10 MG tablet, Take 1 tablet (10 mg total) by mouth daily., Disp: 90 tablet, Rfl: 4   torsemide (DEMADEX) 20 MG tablet, Take 40 mg by mouth daily as needed (fluid retention)., Disp: , Rfl:    triamcinolone cream (KENALOG) 0.1 %, Apply 1 application topically 2 (two) times daily as needed (skin irritation)., Disp: , Rfl:    venlafaxine XR (EFFEXOR XR) 75 MG 24 hr capsule, Take 3 capsules (225 mg total) by mouth daily., Disp: 270 capsule, Rfl: 4   Assessment and Plan: 1. Lumbar radiculopathy   2. Degeneration of lumbar intervertebral disc   3. Chronic, continuous use of opioids   4. Chronic pain syndrome   5. Bilateral hand pain   6. Facet syndrome, lumbar   7. Bilateral sciatica   8. Spasm of muscle of lower back   Based on our discussion today and upon review of the [facility] practitioner database information going to refill his medicine for August 3 and September 2.  No changes in the quantity or frequency of his medication will be made today.  He seems to be doing well with her current protocol.  Continue stretching strengthening exercises and efforts at weight loss.  With the cooler weather hopefully he can be more active outside.  He is to follow-up with his primary care physicians for baseline medical care with return to [place] in 2 months and a UDS in the meantime.  For routine monitoring.  Follow Up Instructions:    I discussed the assessment and treatment plan with the patient. The patient was provided an opportunity to ask questions and all were answered. The patient agreed  with the plan and demonstrated an understanding of the instructions.   The patient was advised to call back or seek an in-person evaluation if the symptoms worsen or if the condition fails to improve as anticipated.  I provided 30 minutes of non-face-to-face time during this encounter.   [name], MD

## 2020-09-03 ENCOUNTER — Other Ambulatory Visit: Payer: Self-pay | Admitting: Nurse Practitioner

## 2020-09-03 NOTE — Telephone Encounter (Signed)
  Notes to clinic:  medication filled by historical provider  Review for refill    Requested Prescriptions  Pending Prescriptions Disp Refills   pantoprazole (PROTONIX) 20 MG tablet [Pharmacy Med Name: PANTOPRAZOLE SOD DR 20 MG TAB] 90 tablet     Sig: TAKE 2 TABLETS (40 MG TOTAL) BY MOUTH DAILY.     Gastroenterology: Proton Pump Inhibitors Passed - 09/03/2020  9:31 AM      Passed - Valid encounter within last 12 months    Recent Outpatient Visits           3 weeks ago Depression, recurrent ([place])   [place], Barbaraann Faster, NP   1 month ago Cough   [place], [place] T, NP   3 months ago Depression, recurrent ([place])   [place], Jolene T, NP   4 months ago Acute cystitis without hematuria   [place], [place] T, NP   4 months ago Acute cystitis without hematuria   [place], Barbaraann Faster, NP       Future Appointments             In 2 months [place], Barbaraann Faster, NP [employer]   In 8 months  [employer]

## 2020-09-03 NOTE — Telephone Encounter (Signed)
Patient last seen 08/11/20 and has appointment 11/12/20

## 2020-09-07 ENCOUNTER — Other Ambulatory Visit: Payer: Self-pay | Admitting: Nurse Practitioner

## 2020-09-07 DIAGNOSIS — I10 Essential (primary) hypertension: Secondary | ICD-10-CM

## 2020-09-13 ENCOUNTER — Ambulatory Visit (INDEPENDENT_AMBULATORY_CARE_PROVIDER_SITE_OTHER): Payer: Medicare Other

## 2020-09-13 DIAGNOSIS — I442 Atrioventricular block, complete: Secondary | ICD-10-CM

## 2020-09-14 LAB — CUP PACEART REMOTE DEVICE CHECK
Battery Remaining Longevity: 37 mo
Battery Voltage: 2.97 V
Brady Statistic AP VP Percent: 29.84 %
Brady Statistic AP VS Percent: 0.02 %
Brady Statistic AS VP Percent: 70.01 %
Brady Statistic AS VS Percent: 0.13 %
Brady Statistic RA Percent Paced: 29.61 %
Brady Statistic RV Percent Paced: 99.84 %
Date Time Interrogation Session: 20220822102753
Implantable Lead Implant Date: 20180124
Implantable Lead Implant Date: 20180124
Implantable Lead Location: 753859
Implantable Lead Location: 753860
Implantable Lead Model: 3830
Implantable Lead Model: 5076
Implantable Pulse Generator Implant Date: 20180124
Lead Channel Impedance Value: 342 Ohm
Lead Channel Impedance Value: 361 Ohm
Lead Channel Impedance Value: 418 Ohm
Lead Channel Impedance Value: 494 Ohm
Lead Channel Pacing Threshold Amplitude: 0.5 V
Lead Channel Pacing Threshold Amplitude: 0.75 V
Lead Channel Pacing Threshold Pulse Width: 0.4 ms
Lead Channel Pacing Threshold Pulse Width: 0.4 ms
Lead Channel Sensing Intrinsic Amplitude: 1.25 mV
Lead Channel Sensing Intrinsic Amplitude: 1.25 mV
Lead Channel Sensing Intrinsic Amplitude: 4.125 mV
Lead Channel Sensing Intrinsic Amplitude: 4.125 mV
Lead Channel Setting Pacing Amplitude: 2 V
Lead Channel Setting Pacing Amplitude: 2.25 V
Lead Channel Setting Pacing Pulse Width: 1 ms
Lead Channel Setting Sensing Sensitivity: 0.6 mV

## 2020-09-16 ENCOUNTER — Other Ambulatory Visit: Payer: Self-pay | Admitting: Nurse Practitioner

## 2020-09-16 NOTE — Telephone Encounter (Signed)
Already signed on 09/03/20 at 10:53 am.

## 2020-09-20 ENCOUNTER — Encounter: Payer: Self-pay | Admitting: Nurse Practitioner

## 2020-09-20 ENCOUNTER — Other Ambulatory Visit: Payer: Self-pay

## 2020-09-20 ENCOUNTER — Ambulatory Visit
Admission: RE | Admit: 2020-09-20 | Discharge: 2020-09-20 | Disposition: A | Discharge status: Home / Self Care | Payer: Medicare Other | Source: Ambulatory Visit | Attending: Nurse Practitioner | Admitting: Nurse Practitioner

## 2020-09-20 ENCOUNTER — Ambulatory Visit (INDEPENDENT_AMBULATORY_CARE_PROVIDER_SITE_OTHER): Payer: Medicare Other | Admitting: Nurse Practitioner

## 2020-09-20 VITALS — BP 141/84 | HR 80 | Temp 98.8°F | Wt 279.4 lb

## 2020-09-20 DIAGNOSIS — R1031 Right lower quadrant pain: Secondary | ICD-10-CM | POA: Insufficient documentation

## 2020-09-20 DIAGNOSIS — R8281 Pyuria: Secondary | ICD-10-CM

## 2020-09-20 DIAGNOSIS — G8929 Other chronic pain: Secondary | ICD-10-CM | POA: Insufficient documentation

## 2020-09-20 LAB — MICROSCOPIC EXAMINATION

## 2020-09-20 LAB — URINALYSIS, ROUTINE W REFLEX MICROSCOPIC
Bilirubin, UA: NEGATIVE
Glucose, UA: NEGATIVE
Ketones, UA: NEGATIVE
Nitrite, UA: NEGATIVE
Protein,UA: NEGATIVE
RBC, UA: NEGATIVE
Specific Gravity, UA: 1.015 (ref 1.005–1.030)
Urobilinogen, Ur: 1 mg/dL (ref 0.2–1.0)
pH, UA: 6.5 (ref 5.0–7.5)

## 2020-09-20 LAB — POCT I-STAT CREATININE: Creatinine, Ser: 0.9 mg/dL (ref 0.61–1.24)

## 2020-09-20 MED ORDER — IOHEXOL 350 MG/ML SOLN
100.0000 mL | Freq: Once | INTRAVENOUS | Status: AC | PRN
  Administered 2020-09-20: 100 mL via INTRAVENOUS

## 2020-09-20 NOTE — Patient Instructions (Signed)
Abdominal Pain, Adult Many things can cause belly (abdominal) pain. Most times, belly pain is not dangerous. Many cases of belly pain can be watched and treated at home. Sometimes, though, belly pain is serious. Yourdoctor will try to find the cause of your belly pain. Follow these instructions at home:  Medicines Take over-the-counter and prescription medicines only as told by your doctor. Do not take medicines that help you poop (laxatives) unless told by your doctor. General instructions Watch your belly pain for any changes. Drink enough fluid to keep your pee (urine) pale yellow. Keep all follow-up visits as told by your doctor. This is important. Contact a doctor if: Your belly pain changes or gets worse. You are not hungry, or you lose weight without trying. You are having trouble pooping (constipated) or have watery poop (diarrhea) for more than 2-3 days. You have pain when you pee or poop. Your belly pain wakes you up at night. Your pain gets worse with meals, after eating, or with certain foods. You are vomiting and cannot keep anything down. You have a fever. You have blood in your pee. Get help right away if: Your pain does not go away as soon as your doctor says it should. You cannot stop vomiting. Your pain is only in areas of your belly, such as the right side or the left lower part of the belly. You have bloody or black poop, or poop that looks like tar. You have very bad pain, cramping, or bloating in your belly. You have signs of not having enough fluid or water in your body (dehydration), such as: Dark pee, very little pee, or no pee. Cracked lips. Dry mouth. Sunken eyes. Sleepiness. Weakness. You have trouble breathing or chest pain. Summary Many cases of belly pain can be watched and treated at home. Watch your belly pain for any changes. Take over-the-counter and prescription medicines only as told by your doctor. Contact a doctor if your belly pain  changes or gets worse. Get help right away if you have very bad pain, cramping, or bloating in your belly. This information is not intended to replace advice given to you by your health care provider. Make sure you discuss any questions you have with your healthcare provider. Document Revised: 05/20/2018 Document Reviewed: 05/20/2018 Elsevier Patient Education  2022 Elsevier Inc.  

## 2020-09-20 NOTE — Assessment & Plan Note (Signed)
New onset over past week with worsening, +psoas and obturator -- concern for appendicitis based on exam and presentation.  Order for STAT CT abd/pelvis to further assess, discussed with patient at length.  CBC and CMP today + UA == UA showed trace leuk and few bacteria, will send for culture.  If acute findings on imaging will determine need for ER visit today and further work-up.  At this time plan on return in 4 days.

## 2020-09-20 NOTE — Progress Notes (Signed)
BP (!) 141/84   Pulse 80   Temp 98.8 F (37.1 C) (Oral)   Wt 279 lb 6.4 oz (126.7 kg)   SpO2 94%   BMI 38.97 kg/m    Subjective:    Patient ID: Nicholas [name], male    DOB: 1945-03-19, 75 y.o.   MRN: [id number]  HPI: Nicholas [name] is a 75 y.o. male  Chief Complaint  Patient presents with   Flank Pain    Patient is here for right side pain. Patient states he has a sharp pain in his right side and it has become constant. Patient wife states he has been dealing with the pain since March, but has gotten worse within with few days.    RIGHT LOWER ABDOMEN PAIN Started with back pain to right side about Thursday or Friday last week, to right side of lower abdomen, sharp.  Still has gall bladder & appendix.  Notices pain more with bending over and rotating to left.   Sharp pain -- 10/10.  Does follow Dr. Andree Elk for chronic pain at baseline, but this is more acute pain.  He reports having a regular bowel regimen with no constipation or diarrhea.  Did see Dr. Chauncey Mann on 07/15/20 for yearly check up -- to continue on Protonix and was ordered to start Colace -- had no pain at that time.   Did have CT abdomen in April 2022 for concern of kidney stone, but this returned normal.  He was treated for UTI at that time.  Reports this pain is different from pain at that time. Dysuria: no Urinary frequency: none Urgency: occasional Small volume voids: no Symptom severity: yes Urinary incontinence: no Foul odor: no Hematuria: no Abdominal pain: right side abdomen, lower Back pain: none Suprapubic pain/pressure: no Flank pain: none Fever:  no Vomiting: no Status: stable Previous urinary tract infection: yes Recurrent urinary tract infection: no Sexual activity: monogomous History of sexually transmitted disease: no Treatments attempted: none   Relevant past medical, surgical, family and social history reviewed and updated as indicated. Interim medical history since our last visit  reviewed. Allergies and medications reviewed and updated.  Review of Systems  Constitutional:  Positive for appetite change (bloating causes less appetite). Negative for activity change, chills, diaphoresis, fatigue and fever.  Respiratory: Negative.    Cardiovascular: Negative.   Gastrointestinal:  Positive for abdominal distention and abdominal pain (right side lower). Negative for anal bleeding, blood in stool, constipation, diarrhea, nausea, rectal pain and vomiting.  Genitourinary:  Positive for urgency. Negative for dysuria, flank pain, frequency and hematuria.  Neurological: Negative.   Psychiatric/Behavioral: Negative.     Per HPI unless specifically indicated above     Objective:    BP (!) 141/84   Pulse 80   Temp 98.8 F (37.1 C) (Oral)   Wt 279 lb 6.4 oz (126.7 kg)   SpO2 94%   BMI 38.97 kg/m   Wt Readings from Last 3 Encounters:  09/20/20 279 lb 6.4 oz (126.7 kg)  08/18/20 274 lb (124.3 kg)  08/11/20 276 lb 6.4 oz (125.4 kg)    Physical Exam Vitals and nursing note reviewed.  Constitutional:      General: He is awake. He is not in acute distress.    Appearance: He is well-developed and well-groomed. He is obese.  HENT:     Head: Normocephalic and atraumatic.     Right Ear: Hearing normal. No drainage.     Left Ear: Hearing normal. No drainage.  Eyes:     General: Lids are normal.        Right eye: No discharge.        Left eye: No discharge.     Conjunctiva/sclera: Conjunctivae normal.     Pupils: Pupils are equal, round, and reactive to light.  Neck:     Thyroid: No thyromegaly.     Vascular: No carotid bruit.  Cardiovascular:     Rate and Rhythm: Normal rate and regular rhythm.     Heart sounds: Normal heart sounds, S1 normal and S2 normal. No murmur heard.   No gallop.  Pulmonary:     Effort: Pulmonary effort is normal. No accessory muscle usage or respiratory distress.     Breath sounds: Normal breath sounds.  Abdominal:     General: Bowel  sounds are normal. There is no distension.     Palpations: Abdomen is soft. There is no hepatomegaly.     Tenderness: There is abdominal tenderness in the right lower quadrant. There is no right CVA tenderness or left CVA tenderness. Positive signs include Rovsing's sign, psoas sign and obturator sign. Negative signs include Murphy's sign.     Hernia: No hernia is present.  Musculoskeletal:        General: Normal range of motion.     Cervical back: Normal range of motion and neck supple.     Right lower leg: No edema.     Left lower leg: No edema.  Skin:    General: Skin is warm and dry.  Neurological:     Mental Status: He is alert and oriented to person, place, and time.  Psychiatric:        Attention and Perception: Attention normal.        Mood and Affect: Mood normal.        Behavior: Behavior normal. Behavior is cooperative.        Thought Content: Thought content normal.    Results for orders placed or performed in visit on 09/20/20  Microscopic Examination   Urine  Result Value Ref Range   WBC, UA 0-5 0 - 5 /hpf   RBC 0-2 0 - 2 /hpf   Epithelial Cells (non renal) 0-10 0 - 10 /hpf   Mucus, UA Present (A) Not Estab.   Bacteria, UA Few (A) None seen/Few  Urinalysis, Routine w reflex microscopic  Result Value Ref Range   Specific Gravity, UA 1.015 1.005 - 1.030   pH, UA 6.5 5.0 - 7.5   Color, UA Yellow Yellow   Appearance Ur Clear Clear   Leukocytes,UA Trace (A) Negative   Protein,UA Negative Negative/Trace   Glucose, UA Negative Negative   Ketones, UA Negative Negative   RBC, UA Negative Negative   Bilirubin, UA Negative Negative   Urobilinogen, Ur 1.0 0.2 - 1.0 mg/dL   Nitrite, UA Negative Negative   Microscopic Examination See below:       Assessment & Plan:   Problem List Items Addressed This Visit       Other   Right lower quadrant abdominal pain - Primary    New onset over past week with worsening, +psoas and obturator -- concern for appendicitis based  on exam and presentation.  Order for STAT CT abd/pelvis to further assess, discussed with patient at length.  CBC and CMP today + UA == UA showed trace leuk and few bacteria, will send for culture.  If acute findings on imaging will determine need for ER visit today and further work-up.  At this time plan on return in 4 days.      Relevant Orders   Urinalysis, Routine w reflex microscopic (Completed)   CBC with Differential/Platelet   Comprehensive metabolic panel   CT Abdomen Pelvis W Contrast   Other Visit Diagnoses     Pyuria       Relevant Orders   Urine Culture        Follow up plan: Return in about 4 days (around 09/24/2020) for Abdominal Pain.

## 2020-09-21 ENCOUNTER — Telehealth: Payer: Self-pay | Admitting: Nurse Practitioner

## 2020-09-21 LAB — COMPREHENSIVE METABOLIC PANEL
ALT: 20 IU/L (ref 0–44)
AST: 28 IU/L (ref 0–40)
Albumin/Globulin Ratio: 1.8 (ref 1.2–2.2)
Albumin: 4.8 g/dL — ABNORMAL HIGH (ref 3.7–4.7)
Alkaline Phosphatase: 68 IU/L (ref 44–121)
BUN/Creatinine Ratio: 13 (ref 10–24)
BUN: 14 mg/dL (ref 8–27)
Bilirubin Total: 0.8 mg/dL (ref 0.0–1.2)
CO2: 24 mmol/L (ref 20–29)
Calcium: 9.9 mg/dL (ref 8.6–10.2)
Chloride: 100 mmol/L (ref 96–106)
Creatinine, Ser: 1.05 mg/dL (ref 0.76–1.27)
Globulin, Total: 2.7 g/dL (ref 1.5–4.5)
Glucose: 152 mg/dL — ABNORMAL HIGH (ref 65–99)
Potassium: 4.6 mmol/L (ref 3.5–5.2)
Sodium: 140 mmol/L (ref 134–144)
Total Protein: 7.5 g/dL (ref 6.0–8.5)
eGFR: 74 mL/min/{1.73_m2} (ref >59)

## 2020-09-21 LAB — CBC WITH DIFFERENTIAL/PLATELET
Basophils Absolute: 0 10*3/uL (ref 0.0–0.2)
Basos: 1 %
EOS (ABSOLUTE): 0.3 10*3/uL (ref 0.0–0.4)
Eos: 5 %
Hematocrit: 47.1 % (ref 37.5–51.0)
Hemoglobin: 15.5 g/dL (ref 13.0–17.7)
Immature Grans (Abs): 0 10*3/uL (ref 0.0–0.1)
Immature Granulocytes: 0 %
Lymphocytes Absolute: 1 10*3/uL (ref 0.7–3.1)
Lymphs: 18 %
MCH: 30.2 pg (ref 26.6–33.0)
MCHC: 32.9 g/dL (ref 31.5–35.7)
MCV: 92 fL (ref 79–97)
Monocytes Absolute: 0.4 10*3/uL (ref 0.1–0.9)
Monocytes: 8 %
Neutrophils Absolute: 3.7 10*3/uL (ref 1.4–7.0)
Neutrophils: 68 %
Platelets: 167 10*3/uL (ref 150–450)
RBC: 5.13 x10E6/uL (ref 4.14–5.80)
RDW: 13 % (ref 11.6–15.4)
WBC: 5.4 10*3/uL (ref 3.4–10.8)

## 2020-09-21 MED ORDER — TAMSULOSIN HCL 0.4 MG PO CAPS: 0.4000 mg | ORAL_CAPSULE | Freq: Every day | ORAL | 3 refills | Status: DC

## 2020-09-21 MED ORDER — AMOXICILLIN-POT CLAVULANATE 875-125 MG PO TABS: 1.0000 | ORAL_TABLET | Freq: Two times a day (BID) | ORAL | 0 refills | Status: AC

## 2020-09-21 NOTE — Progress Notes (Signed)
Alerted via telephone

## 2020-09-21 NOTE — Progress Notes (Signed)
Contacted via MyChart   Good morning Nicholas [name], your labs have returned.   - CBC shows no elevation in white blood cell count or anemia, good news. - CMP continues to show elevation in glucose, which we have been monitoring with diabetes A1c checks, but normal kidney function.   - Waiting on urine culture -- for this time due to imaging showing possible urine infection and inflammation I am sending in some Augmentin, antibiotic, and also will send in some Flomax for you to start taking due to concern for possible stone.  I have placed urology referral and you should hear from them over next days to schedule.  Any questions? Keep being awesome!!  Thank you for allowing me to participate in your care.  I appreciate you. Kindest regards, Mico Spark

## 2020-09-21 NOTE — Telephone Encounter (Signed)
Spoke to patient and wife on 09/20/20 at 1850.  Alerted them to results -- urology referral in and Flomax started + Augmentin for possible stone and infection.

## 2020-09-21 NOTE — Addendum Note (Signed)
Addended by: [name] T on: 09/21/2020 07:10 AM   Modules accepted: Orders

## 2020-09-21 NOTE — Addendum Note (Signed)
Addended by: [name] T on: 09/21/2020 07:06 AM   Modules accepted: Orders

## 2020-09-22 LAB — URINE CULTURE

## 2020-09-22 NOTE — Progress Notes (Signed)
Contacted via MyChart   Good morning, urine returned showing now growth on culture.  Although CT was suspicious for inflammation, possible infection.  I would complete antibiotic I sent in and continue follow-up with urology.  If worsening pain return to see me ASAP.   Keep being awesome!!  Thank you for allowing me to participate in your care.  I appreciate you. Kindest regards, Mayling Aber

## 2020-09-29 NOTE — Progress Notes (Signed)
Remote pacemaker transmission.   

## 2020-10-04 ENCOUNTER — Other Ambulatory Visit: Payer: Self-pay | Admitting: Anesthesiology

## 2020-10-04 NOTE — Progress Notes (Signed)
10/05/20 3:53 PM   Nicholas [name] 1945-12-08 [id number]  Referring provider:  Venita Lick, NP [address] [place],  [place] [address] Chief Complaint  Patient presents with   Abdominal Pain     HPI: Nicholas [name] is a 74 y.o.male who presents today for further evaluation of right lower quadrant abdominal pain and pyuria.   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   His most recent PSA on 02/11/2020 was elevated to 4.2.   He underwent a CT abdomen and pelvis w/ contrast on 09/20/2020 that revealed slight dilatation of the right renal collecting system with possible punctate stone at the right UVJ and thick-walled urinary bladder probably due to under distension though consider cystitis.  He had a urinalysis checked by primary care on 09/20/2020 that was unremarkable with a negative urine culture.   He is accompanied today by his wife. He reports having lower flank pain that worsens with physical movement, straining, leaning over, getting in and out of car. He denies frequency, burning, and pain during urination. He denies any constipation or diarrhea.   He reports that has a family history of prostate cancer.   PSA trend  Component Prostate Specific Ag, Serum  Latest Ref Rng & Units 0.0 - 4.0 ng/mL  03/23/2016 2.0  07/16/2018 3.8  11/10/2019 5.0 (H)  02/11/2020 4.2 (H)    PMH: Past Medical History:  Diagnosis Date   Anterior urethral stricture    Anxiety    Arthritis    a. knees, hips, hands;  b. 11/2013 s/p L TKA @ [facility].   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 Dr Caryl Comes   Depression    DVT (deep venous thrombosis) ([place])    Erosive esophagitis    Gross hematuria    Hyperlipemia    Hypertension    borderline   Internal hemorrhoids    Phimosis    Pulmonary embolism ([place])      Surgical History: Past Surgical History:  Procedure Laterality Date   ATRIAL FIBRILLATION ABLATION N/A 05/17/2020   Procedure: ATRIAL FIBRILLATION ABLATION;  Surgeon: Vickie Epley, MD;  Location: [facility];  Service: Cardiovascular;  Laterality: N/A;   BUNIONECTOMY Bilateral 01/06/2015   Procedure: Lillard Anes;  Surgeon: [name], MD;  Location: [facility];  Service: Orthopedics;  Laterality: Bilateral;   CARDIAC CATHETERIZATION  ~ 2005   "once"   CATARACT EXTRACTION W/ INTRAOCULAR LENS  IMPLANT, BILATERAL Bilateral ~ 2010   COLONOSCOPY WITH PROPOFOL N/A 12/07/2015   Procedure: COLONOSCOPY WITH PROPOFOL;  Surgeon: Lollie Sails, MD;  Location[facility] END[facility];  Service: Endoscopy;  Laterality: N/A;   EP IMPLANTABLE DEVICE N/A 02/16/2016   MDT dual chamber (His Bundle) pacemaker implanted by Dr Caryl Comes for intermittent complete heart block   ESOPHAGOGASTRODUODENOSCOPY (EGD) WITH PROPOFOL N/A 12/07/2015   Procedure: ESOPHAGOGASTRODUODENOSCOPY (EGD) WITH PROPOFOL;  Surgeon: Lollie Sails, MD;  Location: [facility] ENDOSCOPY;  Service: Endoscopy;  Laterality: N/A;   ESOPHAGOGASTRODUODENOSCOPY (EGD) WITH PROPOFOL N/A 05/17/2017   Procedure: ESOPHAGOGASTRODUODENOSCOPY (EGD) WITH PROPOFOL;  Surgeon: Lollie Sails, MD;  Location: [facility] ENDOSCOPY;  Service: Endoscopy;  Laterality: N/A;   HAMMER TOE SURGERY Bilateral 01/06/2015   Procedure: HAMMER TOE CORRECTION;  Surgeon: [name], MD;  Location: [facility] ORS;  Service: Orthopedics;  Laterality: Bilateral;   KNEE CARTILAGE SURGERY Left 1965   "football injury"   Left Total Knee Arthroplasty     a. 11/2013 [facility].   PILONIDAL CYST EXCISION  1970's   TOTAL HIP ARTHROPLASTY Right 2004   TOTAL HIP ARTHROPLASTY Left 2006   UPPER GI ENDOSCOPY      Home Medications:  Allergies as of 10/05/2020   No Known Allergies      Medication List        Accurate as of October 05, 2020  3:53 PM. If you have any questions, ask your  nurse or doctor.          STOP taking these medications    tamsulosin 0.4 MG Caps capsule Commonly known as: FLOMAX Stopped by: Hollice Espy, MD       TAKE these medications    acetaminophen 650 MG CR tablet Commonly known as: TYLENOL Take 650 mg by mouth every 8 (eight) hours as needed for pain.   Cranberry 500 MG Caps Take 500 mg by mouth daily.   Eliquis 5 MG Tabs tablet Generic drug: apixaban TAKE 1 TABLET BY MOUTH TWICE A DAY   Fish Oil 1200 MG Caps Take 1,200 mg by mouth daily.   fludrocortisone 0.1 MG tablet Commonly known as: FLORINEF Take 0.1 mg by mouth daily.   gabapentin 600 MG tablet Commonly known as: Neurontin Take 1 tablet (600 mg total) by mouth at bedtime.   HYDROcodone-acetaminophen 5-325 MG tablet Commonly known as: NORCO/VICODIN Take 1 tablet by mouth 2 (two) times daily.   hyoscyamine 0.125 MG tablet Commonly known as: LEVSIN Take 0.125 mg by mouth every 6 (six) hours as needed for bladder spasms or cramping.   IRON 27 PO Take 27 mg by mouth daily.   LORazepam 1 MG tablet Commonly known as: ATIVAN Take 1 tablet (1 mg total) by mouth at bedtime.   Magnesium 400 MG Tabs Take 400 mg by mouth daily.   methocarbamol 500 MG tablet Commonly known as: Robaxin Take 1 tablet (500 mg total) by mouth 2 (two) times daily as needed for muscle spasms.   midodrine 10 MG tablet Commonly known as: PROAMATINE TAKE 1 TABLET BY MOUTH THREE TIMES A DAY   multivitamin with minerals Tabs tablet Take 1 tablet by mouth daily. Centrum Silver   nystatin powder Commonly known as: MYCOSTATIN/NYSTOP Apply 1 application topically 3 (three) times daily.   oxymetazoline 0.05 % nasal spray Commonly known as: AFRIN Place 2 sprays into both nostrils at bedtime.   pantoprazole 20 MG tablet Commonly known as: PROTONIX TAKE 2 TABLETS (40 MG TOTAL) BY MOUTH DAILY. What changed: how much to take   potassium chloride SA 20 MEQ tablet Commonly known as:  KLOR-CON Take 2 tablets (40 meq) x 1 dose as directed What changed:  how much to take how to take this when to take this reasons to take this additional instructions   QUEtiapine Fumarate 150 MG 24 hr tablet Commonly known as: SEROQUEL XR TAKE 1 TABLET (150 MG TOTAL) BY MOUTH AT BEDTIME.   rOPINIRole 0.25 MG tablet Commonly known as: REQUIP Take 0.25 mg by mouth 4 (four) times daily.   rosuvastatin 40 MG tablet Commonly known as: CRESTOR Take 1 tablet (40 mg total) by mouth daily.   solifenacin 10 MG tablet Commonly known as: VESICARE TAKE 1 TABLET BY MOUTH EVERY DAY   torsemide 20 MG tablet Commonly known as: DEMADEX Take 40 mg by mouth daily as needed (fluid  retention).   triamcinolone cream 0.1 % Commonly known as: KENALOG Apply 1 application topically 2 (two) times daily as needed (skin irritation).   venlafaxine XR 75 MG 24 hr capsule Commonly known as: Effexor XR Take 3 capsules (225 mg total) by mouth daily.   vitamin C 1000 MG tablet Take 1,000 mg by mouth daily.        Allergies: No Known Allergies  Family History: Family History  Problem Relation Age of Onset   Alcoholism Mother        died in her 52's.   Cancer Mother    Stroke Father        deceased   Diabetes Father    Hypertension Father    Glaucoma Sister    Breast cancer Sister    Diabetes Daughter    Asthma Son    Stroke Paternal Grandmother    Prostate cancer Paternal Grandfather     Social History:  reports that he has never smoked. He has never used smokeless tobacco. He reports current alcohol use of about 1.0 standard drink per week. He reports that he does not use drugs.   Physical Exam: BP (!) 163/80   Pulse 83   Ht '5\' 11"'$  (1.803 m)   Wt 280 lb (127 kg)   BMI 39.05 kg/m   Constitutional:  Alert and oriented, No acute distress. HEENT: [place] AT, moist mucus membranes.  Trachea midline, no masses. Cardiovascular: No clubbing, cyanosis, or edema. Respiratory: Normal  respiratory effort, no increased work of breathing. Rectal: Normal sphincter tone,    50 CC prostate, smooth no nodules. Skin: No rashes, bruises or suspicious lesions. Neurologic: Grossly intact, no focal deficits, moving all 4 extremities. Psychiatric: Normal mood and affect.  Laboratory Data:  Lab Results  Component Value Date   CREATININE 0.90 09/20/2020     Lab Results  Component Value Date   HGBA1C 6.5 08/11/2020    Urinalysis Unremarkable, no evidence of infection   Pertinent Imaging: CLINICAL DATA:  Right lower quadrant pain   EXAM: CT ABDOMEN AND PELVIS WITH CONTRAST   TECHNIQUE: Multidetector CT imaging of the abdomen and pelvis was performed using the standard protocol following bolus administration of intravenous contrast.   CONTRAST:  1101m OMNIPAQUE IOHEXOL 350 MG/ML SOLN   COMPARISON:  CT 04/28/2020   FINDINGS: Lower chest: Lung bases demonstrate minimal atelectasis or scar at the right base. No acute consolidation or effusion. Coronary vascular calcification. Partially visualized cardiac pacing leads. Elevated right diaphragm.   Hepatobiliary: No focal liver abnormality is seen. No gallstones, gallbladder wall thickening, or biliary dilatation.   Pancreas: Unremarkable. No pancreatic ductal dilatation or surrounding inflammatory changes.   Spleen: Normal in size without focal abnormality.   Adrenals/Urinary Tract: Adrenal glands are normal. Minimal right hydronephrosis. Possible punctate stone at right UVJ on coronal views, series 5, image 71. Thick-walled decompressed urinary bladder   Stomach/Bowel: Stomach is within normal limits. Appendix appears normal. No evidence of bowel wall thickening, distention, or inflammatory changes.   Vascular/Lymphatic: Advanced aortic atherosclerosis. No aneurysm. No suspicious nodes   Reproductive: Prostate is unremarkable.   Other: Negative for pelvic effusion or free air. Small fat containing  umbilical hernia   Musculoskeletal: Bilateral hip replacement with artifact   IMPRESSION: 1. Slight dilatation of right renal collecting system with possible punctate stone at the right UVJ. 2. Negative for acute appendicitis. 3. Thick-walled urinary bladder probably due to under distension though consider cystitis     Electronically Signed   By:  Donavan Foil M.D.   On: 09/20/2020 17:54  CT scan was carefully reviewed today.  Disagree with radiologic interpretation, there is a punctate calcification near the level of the prostate adjacent to the UVJ but do not suspect this is an obstructing stone.  Right-sided renal pelvic fullness is minimal.  Assessment & Plan:    Elevated/ rising PSA  - PSA; pending  - Urine today was unremarkable with no evidence of infection  -may need to consider biopsy vs. Close follow up  Bladder thickening  -Incidental, likely due to under distention and or chronic outlet obstruction -No evidence of cystitis on urinalysis  Prostate calcification  - CT reviewed and seems consistent with prostate calcification   4. Right hydronephrosis -Right renal pelvic fullness without overt hydronephrosis -Plan to repeat renal ultrasound in 4 weeks to ensure that this is resolved and is not worsening  5.  Right lower quadrant pain -The nature of his pain is now consistent with an acute stone episode, exacerbated with movement.  There is no evidence of hernia or any other pathology on the CT scan thus suspect this is likely musculoskeletal  Follow-up in 4 weeks with [facility]; will discuss repeat PSA   I,Kailey Littlejohn,acting as a scribe for Hollice Espy, MD.,have documented all relevant documentation on the behalf of Hollice Espy, MD,as directed by  Hollice Espy, MD while in the presence of Hollice Espy, MD.  I have reviewed the above documentation for accuracy and completeness, and I agree with the above.   Hollice Espy, MD   [facility] [address], [place], [place] [address] [phone]

## 2020-10-05 ENCOUNTER — Encounter: Payer: Self-pay | Admitting: Internal Medicine

## 2020-10-05 ENCOUNTER — Ambulatory Visit: Payer: Medicare Other | Admitting: Urology

## 2020-10-05 ENCOUNTER — Ambulatory Visit (INDEPENDENT_AMBULATORY_CARE_PROVIDER_SITE_OTHER): Payer: Medicare Other | Admitting: Internal Medicine

## 2020-10-05 ENCOUNTER — Other Ambulatory Visit: Payer: Self-pay

## 2020-10-05 ENCOUNTER — Encounter: Payer: Self-pay | Admitting: Urology

## 2020-10-05 VITALS — BP 163/80 | HR 83 | Ht 71.0 in | Wt 280.0 lb

## 2020-10-05 VITALS — BP 128/90 | HR 82 | Ht 71.0 in | Wt 279.0 lb

## 2020-10-05 DIAGNOSIS — Z950 Presence of cardiac pacemaker: Secondary | ICD-10-CM

## 2020-10-05 DIAGNOSIS — N400 Enlarged prostate without lower urinary tract symptoms: Secondary | ICD-10-CM | POA: Diagnosis not present

## 2020-10-05 DIAGNOSIS — N3289 Other specified disorders of bladder: Secondary | ICD-10-CM

## 2020-10-05 DIAGNOSIS — R1031 Right lower quadrant pain: Secondary | ICD-10-CM | POA: Diagnosis not present

## 2020-10-05 DIAGNOSIS — I442 Atrioventricular block, complete: Secondary | ICD-10-CM | POA: Diagnosis not present

## 2020-10-05 DIAGNOSIS — N1330 Unspecified hydronephrosis: Secondary | ICD-10-CM

## 2020-10-05 DIAGNOSIS — I4819 Other persistent atrial fibrillation: Secondary | ICD-10-CM | POA: Diagnosis not present

## 2020-10-05 NOTE — Progress Notes (Signed)
Patient Care Team: Venita Lick, NP as PCP - General (Nurse Practitioner) Vickie Epley, MD as PCP - Electrophysiology (Cardiology) [name], MD (Specialist) Hollice Espy, MD as Consulting Physician (Urology) Tanda Rockers, MD as Consulting Physician (Pulmonary Disease) [name], MD as Consulting Physician (Anesthesiology) Deboraha Sprang, MD as Consulting Physician (Cardiology) Greg Cutter, LCSW as Social Worker (Licensed Clinical Social Worker) Vladimir Faster, [facility] (Pharmacist)   HPI  Nicholas [name] is a 75 y.o. male Seen in follow-up for His bundle pacemaker implanted 1/18 for symptomatic bradycardia  and profound first degree AV block with intermittent third and second-degree heart block; also atrial fibrillation, previously on flecainide but discontinued because of coronary disease diagnosed by CTA 10/21.>> propafenone with subsequent increase in burden Saw CL and underwent RFCA-PVI 4/22  Very little afib since and no longer on propafenone  Orthostatic hypotension much improved The patient denies chest pain, nocturnal dyspnea, orthopnea or peripheral edema.  There have been no palpitations or syncope.  Complains of chronic dyspnea and very limited exercise intolerance < 100 yds  Intercurrent kidney stone and Rx with alpha blocker >> poorly tolerated 2/2 increasingly LH .   Golf --not able to play      DATE TEST EF   10/15 echo     50 %   Mild RV dysfunction   11/17 echo    60 %    Nl RV function   11/17 Myoview   46 ([facility])   10/21 Cardiac-CTA  LADd- FFR 0.75; o/w >90  10/21 Echo 60-65%   10/21 CTA-PE      hxstory of pulmonary embolism with repeat venous Dopplers 1/16 demonstrated small clots Managed with chronic apixaban    DATE PR interval QRSduration Dose  3/19 228 126 (HIS pacing) 100  3/21 P-synchronous   pacing  124 ( His pacing) 100  1/   Propafenone 225          Date Cr K LDL Hgb  6/19 1.08   14.6  6/20 1.13   14.7   6/21 1.17<<1.46   15.6  1/22 1.34 4.2 85 13.6 (10/21)  8/22 0.9 4.6 65 [id number]     Thromboembolic risk factors ( age -28 , HTN-1) for a CHADSVASc Score of 3      Past Medical History:  Diagnosis Date   Anterior urethral stricture    Anxiety    Arthritis    a. knees, hips, hands;  b. 11/2013 s/p L TKA @ [place].   Bile reflux gastritis    Bulging lumbar disc    BXO (balanitis xerotica obliterans)    Complete heart block ([place])    a. s/p MDT dual chamber (His bundle) pacemaker 01/2016 Dr Caryl Comes   Depression    DVT (deep venous thrombosis) ([place])    Erosive esophagitis    Gross hematuria    Hyperlipemia    Hypertension    borderline   Internal hemorrhoids    Phimosis    Pulmonary embolism ([place])     Past Surgical History:  Procedure Laterality Date   ATRIAL FIBRILLATION ABLATION N/A 05/17/2020   Procedure: ATRIAL FIBRILLATION ABLATION;  Surgeon: Vickie Epley, MD;  Location: [facility];  Service: Cardiovascular;  Laterality: N/A;   BUNIONECTOMY Bilateral 01/06/2015   Procedure: Lillard Anes;  Surgeon: [name], MD;  Location: [facility] ORS;  Service: Orthopedics;  Laterality: Bilateral;   CARDIAC CATHETERIZATION  ~ 2005   "once"   CATARACT  EXTRACTION W/ INTRAOCULAR LENS  IMPLANT, BILATERAL Bilateral ~ 2010   COLONOSCOPY WITH PROPOFOL N/A 12/07/2015   Procedure: COLONOSCOPY WITH PROPOFOL;  Surgeon: Lollie Sails, MD;  Location: [facility] ENDOSCOPY;  Service: Endoscopy;  Laterality: N/A;   EP IMPLANTABLE DEVICE N/A 02/16/2016   MDT dual chamber (His Bundle) pacemaker implanted by Dr Caryl Comes for intermittent complete heart block   ESOPHAGOGASTRODUODENOSCOPY (EGD) WITH PROPOFOL N/A 12/07/2015   Procedure: ESOPHAGOGASTRODUODENOSCOPY (EGD) WITH PROPOFOL;  Surgeon: Lollie Sails, MD;  Location: [facility] ENDOSCOPY;  Service: Endoscopy;  Laterality: N/A;   ESOPHAGOGASTRODUODENOSCOPY (EGD) WITH PROPOFOL N/A 05/17/2017   Procedure: ESOPHAGOGASTRODUODENOSCOPY (EGD) WITH PROPOFOL;   Surgeon: Lollie Sails, MD;  Location: [facility] ENDOSCOPY;  Service: Endoscopy;  Laterality: N/A;   HAMMER TOE SURGERY Bilateral 01/06/2015   Procedure: HAMMER TOE CORRECTION;  Surgeon: [name], MD;  Location: [facility] ORS;  Service: Orthopedics;  Laterality: Bilateral;   KNEE CARTILAGE SURGERY Left 1965   "football injury"   Left Total Knee Arthroplasty     a. 11/2013 [facility].   PILONIDAL CYST EXCISION  1970's   TOTAL HIP ARTHROPLASTY Right 2004   TOTAL HIP ARTHROPLASTY Left 2006   UPPER GI ENDOSCOPY      Current Outpatient Medications  Medication Sig Dispense Refill   acetaminophen (TYLENOL) 650 MG CR tablet Take 650 mg by mouth every 8 (eight) hours as needed for pain.     Ascorbic Acid (VITAMIN C) 1000 MG tablet Take 1,000 mg by mouth daily.     Cranberry 500 MG CAPS Take 500 mg by mouth daily.     ELIQUIS 5 MG TABS tablet TAKE 1 TABLET BY MOUTH TWICE A DAY 60 tablet 14   Ferrous Gluconate (IRON 27 PO) Take 27 mg by mouth daily.     fludrocortisone (FLORINEF) 0.1 MG tablet Take 0.1 mg by mouth daily.     gabapentin (NEURONTIN) 600 MG tablet Take 1 tablet (600 mg total) by mouth at bedtime. 90 tablet 4   HYDROcodone-acetaminophen (NORCO/VICODIN) 5-325 MG tablet Take 1 tablet by mouth 2 (two) times daily. 60 tablet 0   hyoscyamine (LEVSIN, ANASPAZ) 0.125 MG tablet Take 0.125 mg by mouth every 6 (six) hours as needed for bladder spasms or cramping.      LORazepam (ATIVAN) 1 MG tablet Take 1 tablet (1 mg total) by mouth at bedtime. 90 tablet 0   Magnesium 400 MG TABS Take 400 mg by mouth daily.      methocarbamol (ROBAXIN) 500 MG tablet Take 1 tablet (500 mg total) by mouth 2 (two) times daily as needed for muscle spasms. 60 tablet 2   midodrine (PROAMATINE) 10 MG tablet TAKE 1 TABLET BY MOUTH THREE TIMES A DAY 270 tablet 3   Multiple Vitamin (MULTIVITAMIN WITH MINERALS) TABS tablet Take 1 tablet by mouth daily. Centrum Silver     nystatin (MYCOSTATIN/NYSTOP) powder Apply 1 application  topically 3 (three) times daily. 30 g 1   Omega-3 Fatty Acids (FISH OIL) 1200 MG CAPS Take 1,200 mg by mouth daily.     oxymetazoline (AFRIN) 0.05 % nasal spray Place 2 sprays into both nostrils at bedtime.     pantoprazole (PROTONIX) 20 MG tablet TAKE 2 TABLETS (40 MG TOTAL) BY MOUTH DAILY. (Patient taking differently: Take 20 mg by mouth daily.) 90 tablet 4   potassium chloride SA (KLOR-CON) 20 MEQ tablet Take 2 tablets (40 meq) x 1 dose as directed (Patient taking differently: Take 40 mEq by mouth daily as needed (with torsemide (fluid retention)).)  10 tablet 0   QUEtiapine Fumarate (SEROQUEL XR) 150 MG 24 hr tablet TAKE 1 TABLET (150 MG TOTAL) BY MOUTH AT BEDTIME. 90 tablet 4   rOPINIRole (REQUIP) 0.25 MG tablet Take 0.25 mg by mouth 4 (four) times daily.     rosuvastatin (CRESTOR) 40 MG tablet Take 1 tablet (40 mg total) by mouth daily. 90 tablet 4   solifenacin (VESICARE) 10 MG tablet TAKE 1 TABLET BY MOUTH EVERY DAY 90 tablet 1   torsemide (DEMADEX) 20 MG tablet Take 40 mg by mouth daily as needed (fluid retention).     triamcinolone cream (KENALOG) 0.1 % Apply 1 application topically 2 (two) times daily as needed (skin irritation).     venlafaxine XR (EFFEXOR XR) 75 MG 24 hr capsule Take 3 capsules (225 mg total) by mouth daily. 270 capsule 4   tamsulosin (FLOMAX) 0.4 MG CAPS capsule Take 1 capsule (0.4 mg total) by mouth daily after supper. (Patient not taking: Reported on 10/05/2020) 30 capsule 3   No current facility-administered medications for this visit.    No Known Allergies    Review of Systems negative except from HPI and PMH  Physical Exam  BP 128/90 (BP Location: Left Arm, Patient Position: Sitting, Cuff Size: Normal)   Pulse 82   Ht '5\' 11"'$  (1.803 m)   Wt 279 lb (126.6 kg)   SpO2 94%   BMI 38.91 kg/m   Well developed and Morbidly obese  in no acute distress HENT normal Neck supple with JVP-8-10 Clear Device pocket well healed; without hematoma or erythema.   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  anticoagulation with Apixoban  which is unaffordable in the [place]ut [place] >400$/m   have given them a name for a [place]-- otherwise he will resume warfarin for the rest of the year.   For now continue Apixoban  5 bid  Orthostatics much improved   will continue florinef at 0.1 daily and midodrine '10mg'$  tid  Long discussion regarding an exercise protocol, intentional slow but also progressive  No angina, continue crestor 40

## 2020-10-05 NOTE — Patient Instructions (Signed)
Medication Instructions:  - Your physician recommends that you continue on your current medications as directed. Please refer to the Current Medication list given to you today.  For Eliquis:  - Please call [phone] (live specialist are available Monday- Friday 8am- 8pm EST) & let them know you are in the donut [place] currently. They can also help to offer options to get you through this period  - We have also had people try to obtain their medication through a [facility]. Below is a contact for one of these pharmacies to see if this may work for you  [email or web address] [phone]  Email: [email or web address]    *If you need a refill on your cardiac medications before your next appointment, please call your pharmacy*   Lab Work: - none ordered  If you have labs (blood work) drawn today and your tests are completely normal, you will receive your results only by: [place] (if you have MyChart) OR A paper copy in the mail If you have any lab test that is abnormal or we need to change your treatment, we will call you to review the results.   Testing/Procedures: - none ordered   Follow-Up: At [facility], you and your health needs are our priority.  As part of our continuing mission to provide you with exceptional heart care, we have created designated Provider Care Teams.  These Care Teams include your primary Cardiologist (physician) and Advanced Practice Providers (APPs -  Physician Assistants and Nurse Practitioners) who all work together to provide you with the care you need, when you need it.  We recommend signing up for the patient portal called "MyChart".  Sign up information is provided on this After Visit Summary.  MyChart is used to connect with patients for Virtual Visits (Telemedicine).  Patients are able to view lab/test results, encounter notes, upcoming appointments, etc.  Non-urgent messages can be sent to your provider as well.   To learn more about what  you can do with MyChart, go to [email or web address].    Your next appointment:   6 month(s)  The format for your next appointment:   In Person  Provider:   Virl Axe, MD   Other Instructions N/a

## 2020-10-06 LAB — MICROSCOPIC EXAMINATION: Bacteria, UA: NONE SEEN

## 2020-10-06 LAB — URINALYSIS, COMPLETE
Bilirubin, UA: NEGATIVE
Glucose, UA: NEGATIVE
Ketones, UA: NEGATIVE
Leukocytes,UA: NEGATIVE
Nitrite, UA: NEGATIVE
RBC, UA: NEGATIVE
Specific Gravity, UA: 1.02 (ref 1.005–1.030)
Urobilinogen, Ur: 1 mg/dL (ref 0.2–1.0)
pH, UA: 6 (ref 5.0–7.5)

## 2020-10-06 LAB — PSA: Prostate Specific Ag, Serum: 4.9 ng/mL — ABNORMAL HIGH (ref 0.0–4.0)

## 2020-10-07 ENCOUNTER — Telehealth: Payer: Self-pay | Admitting: Internal Medicine

## 2020-10-07 LAB — SPECIMEN STATUS REPORT

## 2020-10-07 LAB — TOXASSURE SELECT 13 (MW), URINE

## 2020-10-07 MED ORDER — APIXABAN 5 MG PO TABS
5.0000 mg | ORAL_TABLET | Freq: Two times a day (BID) | ORAL | 1 refills | Status: DC
Start: 2020-10-07 — End: 2020-10-27

## 2020-10-07 NOTE — Telephone Encounter (Signed)
Called and spoke to pt who stated that dr. Caryl Comes instructed them to get a handwritten rx to take to fill in [place]. I am routing this back to [facility] refill pool as we have no staff from [facility] there and someone will need to fill this but instead of hit send normally choose print. Please place up front for the pt to pick up as they were made aware in the phone call that it would be available to pick up there OKAY TO FILL ELIQUIS MG BID 180 w/1 refill.  Prescription refill request for Eliquis received. Indication:AFIB Last office visit:KLEIN Scr:0.9 09/20/20 Age: 65M Weight:127KG

## 2020-10-07 NOTE — Telephone Encounter (Signed)
I called and spoke with the patient's wife, Berline Chough (ok per DPR). I have advised her we have printed the patient's RX for eliquis, but Dr. Caryl Comes will not be back in the office until 10/19/20 to sign.  I inquired how much eliquis the patient has left. Per Mrs. Gassner, the patient has 16 days left of his current RX.  I have advised her I will have Dr. Caryl Comes sign the RX for Eliquis on 10/19/20 and call her when this is ready. I will also pull 1 box of samples for the patient as well to pick up with his RX. Mrs. Ganesan voices understanding and is agreeable.   Samples to be given: Eliquis 5 mg Lot: [id number] Exp: 07/2022 # 1 box

## 2020-10-07 NOTE — Telephone Encounter (Signed)
I NEED A [place] STAFF MEMBER TO FILL THIS RX BUT PRINT IT OUT AND PLACE IT AT THE FRONT DESK. ROUTING BACK TO [place] REFILL.

## 2020-10-07 NOTE — Telephone Encounter (Signed)
*  STAT* If patient is at the pharmacy, call can be transferred to refill team.   1. Which medications need to be refilled? (please list name of each medication and dose if known) Eliquis 5 mg 1 tablet twice daily  2. Which pharmacy/location (including street and city if local pharmacy) is medication to be sent to? Wants written prescription   3. Do they need a 30 day or 90 day supply? 4 month supply

## 2020-10-12 ENCOUNTER — Ambulatory Visit: Payer: Medicare Other | Attending: Anesthesiology | Admitting: Anesthesiology

## 2020-10-12 ENCOUNTER — Encounter: Payer: Self-pay | Admitting: Anesthesiology

## 2020-10-12 ENCOUNTER — Other Ambulatory Visit: Payer: Self-pay

## 2020-10-12 DIAGNOSIS — M5416 Radiculopathy, lumbar region: Secondary | ICD-10-CM

## 2020-10-12 DIAGNOSIS — F1190 Opioid use, unspecified, uncomplicated: Secondary | ICD-10-CM | POA: Diagnosis not present

## 2020-10-12 DIAGNOSIS — G894 Chronic pain syndrome: Secondary | ICD-10-CM | POA: Diagnosis not present

## 2020-10-12 DIAGNOSIS — M62830 Muscle spasm of back: Secondary | ICD-10-CM

## 2020-10-12 DIAGNOSIS — M5431 Sciatica, right side: Secondary | ICD-10-CM

## 2020-10-12 DIAGNOSIS — M79641 Pain in right hand: Secondary | ICD-10-CM

## 2020-10-12 DIAGNOSIS — M47817 Spondylosis without myelopathy or radiculopathy, lumbosacral region: Secondary | ICD-10-CM

## 2020-10-12 DIAGNOSIS — M79642 Pain in left hand: Secondary | ICD-10-CM

## 2020-10-12 DIAGNOSIS — M5432 Sciatica, left side: Secondary | ICD-10-CM

## 2020-10-12 DIAGNOSIS — M47816 Spondylosis without myelopathy or radiculopathy, lumbar region: Secondary | ICD-10-CM

## 2020-10-12 DIAGNOSIS — M1611 Unilateral primary osteoarthritis, right hip: Secondary | ICD-10-CM

## 2020-10-12 DIAGNOSIS — M5136 Other intervertebral disc degeneration, lumbar region: Secondary | ICD-10-CM | POA: Diagnosis not present

## 2020-10-12 MED ORDER — HYDROCODONE-ACETAMINOPHEN 5-325 MG PO TABS: 1.0000 | ORAL_TABLET | Freq: Two times a day (BID) | ORAL | 0 refills | Status: DC | PRN

## 2020-10-12 MED ORDER — HYDROCODONE-ACETAMINOPHEN 5-325 MG PO TABS: 1.0000 | ORAL_TABLET | Freq: Two times a day (BID) | ORAL | 0 refills | Status: AC

## 2020-10-12 NOTE — Progress Notes (Signed)
Virtual Visit via Telephone Note  I connected with Nicholas [name] on 10/12/20 at  4:00 PM EDT by telephone and verified that I am speaking with the correct person using two identifiers.  Location: Patient: Home Provider: Pain control center   I discussed the limitations, risks, security and privacy concerns of performing an evaluation and management service by telephone and the availability of in person appointments. I also discussed with the patient that there may be a patient responsible charge related to this service. The patient expressed understanding and agreed to proceed.   History of Present Illness: Is able to catch Nicholas [name] via telephone today we were unable to link for the video portion of virtual conference but reports that his low back pain and leg pain are stable.  No recent changes are noted.  He started to active but still having a lot of pain in the back and legs.  He is also had a recent finding on the CT scan consistent with kidney stones and this is under urologic evaluation with a possible ultrasound soon as well.  The quality characteristic and distribution of his pain has been stable.  No new changes in lower extremity strength or function or bowel or bladder function are noted.  At present he denies any dysuria or hematuria.  The ultrasound is scheduled for 1 month.  Otherwise he is in his usual state of health and tolerating his opioid medicine regimen well with no side effects reported and continuing to get good relief with the medications generally 50 to 75% improvement lasting 4 to 6 hours when he takes his medicine.  Review of systems: General: No fevers or chills Pulmonary: No shortness of breath or dyspnea Cardiac: No angina or palpitations or lightheadedness GI: No abdominal pain or constipation Psych: No depression    Observations/Objective:  Current Outpatient Medications:    [START ON 11/23/2020] HYDROcodone-acetaminophen (NORCO/VICODIN) 5-325 MG tablet, Take 1  tablet by mouth 2 (two) times daily as needed for moderate pain or severe pain., Disp: 60 tablet, Rfl: 0   acetaminophen (TYLENOL) 650 MG CR tablet, Take 650 mg by mouth every 8 (eight) hours as needed for pain., Disp: , Rfl:    apixaban (ELIQUIS) 5 MG TABS tablet, Take 1 tablet (5 mg total) by mouth 2 (two) times daily., Disp: 180 tablet, Rfl: 1   Ascorbic Acid (VITAMIN C) 1000 MG tablet, Take 1,000 mg by mouth daily., Disp: , Rfl:    Cranberry 500 MG CAPS, Take 500 mg by mouth daily., Disp: , Rfl:    Ferrous Gluconate (IRON 27 PO), Take 27 mg by mouth daily., Disp: , Rfl:    fludrocortisone (FLORINEF) 0.1 MG tablet, Take 0.1 mg by mouth daily., Disp: , Rfl:    gabapentin (NEURONTIN) 600 MG tablet, Take 1 tablet (600 mg total) by mouth at bedtime., Disp: 90 tablet, Rfl: 4   [START ON 10/23/2020] HYDROcodone-acetaminophen (NORCO/VICODIN) 5-325 MG tablet, Take 1 tablet by mouth 2 (two) times daily., Disp: 60 tablet, Rfl: 0   hyoscyamine (LEVSIN, ANASPAZ) 0.125 MG tablet, Take 0.125 mg by mouth every 6 (six) hours as needed for bladder spasms or cramping. , Disp: , Rfl:    LORazepam (ATIVAN) 1 MG tablet, Take 1 tablet (1 mg total) by mouth at bedtime., Disp: 90 tablet, Rfl: 0   Magnesium 400 MG TABS, Take 400 mg by mouth daily. , Disp: , Rfl:    methocarbamol (ROBAXIN) 500 MG tablet, Take 1 tablet (500 mg total) by mouth 2 (  two) times daily as needed for muscle spasms., Disp: 60 tablet, Rfl: 2   midodrine (PROAMATINE) 10 MG tablet, TAKE 1 TABLET BY MOUTH THREE TIMES A DAY, Disp: 270 tablet, Rfl: 3   Multiple Vitamin (MULTIVITAMIN WITH MINERALS) TABS tablet, Take 1 tablet by mouth daily. Centrum Silver, Disp: , Rfl:    nystatin (MYCOSTATIN/NYSTOP) powder, Apply 1 application topically 3 (three) times daily., Disp: 30 g, Rfl: 1   Omega-3 Fatty Acids (FISH OIL) 1200 MG CAPS, Take 1,200 mg by mouth daily., Disp: , Rfl:    oxymetazoline (AFRIN) 0.05 % nasal spray, Place 2 sprays into both nostrils at  bedtime., Disp: , Rfl:    pantoprazole (PROTONIX) 20 MG tablet, TAKE 2 TABLETS (40 MG TOTAL) BY MOUTH DAILY. (Patient taking differently: Take 20 mg by mouth daily.), Disp: 90 tablet, Rfl: 4   potassium chloride SA (KLOR-CON) 20 MEQ tablet, Take 2 tablets (40 meq) x 1 dose as directed (Patient taking differently: Take 40 mEq by mouth daily as needed (with torsemide (fluid retention)).), Disp: 10 tablet, Rfl: 0   QUEtiapine Fumarate (SEROQUEL XR) 150 MG 24 hr tablet, TAKE 1 TABLET (150 MG TOTAL) BY MOUTH AT BEDTIME., Disp: 90 tablet, Rfl: 4   rOPINIRole (REQUIP) 0.25 MG tablet, Take 0.25 mg by mouth 4 (four) times daily., Disp: , Rfl:    rosuvastatin (CRESTOR) 40 MG tablet, Take 1 tablet (40 mg total) by mouth daily., Disp: 90 tablet, Rfl: 4   solifenacin (VESICARE) 10 MG tablet, TAKE 1 TABLET BY MOUTH EVERY DAY, Disp: 90 tablet, Rfl: 1   torsemide (DEMADEX) 20 MG tablet, Take 40 mg by mouth daily as needed (fluid retention)., Disp: , Rfl:    triamcinolone cream (KENALOG) 0.1 %, Apply 1 application topically 2 (two) times daily as needed (skin irritation)., Disp: , Rfl:    venlafaxine XR (EFFEXOR XR) 75 MG 24 hr capsule, Take 3 capsules (225 mg total) by mouth daily., Disp: 270 capsule, Rfl: 4   Assessment and Plan: 1. Lumbar radiculopathy   2. Degeneration of lumbar intervertebral disc   3. Chronic, continuous use of opioids   4. Chronic pain syndrome   5. Bilateral hand pain   6. Facet syndrome, lumbar   7. Bilateral sciatica   8. Spasm of muscle of lower back   9. Osteoarthritis of right hip, unspecified osteoarthritis type   10. Facet arthritis of lumbosacral region   Based on our conversation today and upon review of the [facility] practitioner database information when to refill his medications for October to November 1.  I have reviewed his urine drug screen with him which is appropriate.  We will schedule him for return to clinic evaluation in 2 months.  No other changes in his pain  management protocol are initiated.  Continue follow-up with his urologist regarding the CT findings and we have asked him to keep [place] abreast of how this progresses.  Furthermore continue follow-up with his primary care physicians for baseline medical care.  Follow Up Instructions:    I discussed the assessment and treatment plan with the patient. The patient was provided an opportunity to ask questions and all were answered. The patient agreed with the plan and demonstrated an understanding of the instructions.   The patient was advised to call back or seek an in-person evaluation if the symptoms worsen or if the condition fails to improve as anticipated.  I provided 30 minutes of non-face-to-face time during this encounter.   [name], MD

## 2020-10-19 ENCOUNTER — Ambulatory Visit
Admission: RE | Admit: 2020-10-19 | Discharge: 2020-10-19 | Disposition: A | Discharge status: Home / Self Care | Payer: Medicare Other | Source: Ambulatory Visit | Attending: Urology | Admitting: Urology

## 2020-10-19 ENCOUNTER — Other Ambulatory Visit: Payer: Self-pay

## 2020-10-19 DIAGNOSIS — R1031 Right lower quadrant pain: Secondary | ICD-10-CM | POA: Insufficient documentation

## 2020-10-19 NOTE — Telephone Encounter (Signed)
Patient spouse came by office to check on samples and status of RX for Eliquis  Patient states she will be by on Thursday for samples

## 2020-10-20 NOTE — Telephone Encounter (Signed)
Attempted to call Mrs. Lonia Skinner. No answer- I left a message that the patient's RX & samples of eliquis have been placed at the front desk for pick up.

## 2020-10-21 ENCOUNTER — Telehealth: Payer: Self-pay

## 2020-10-21 NOTE — Telephone Encounter (Signed)
Pt wife called regarding RUS results. Aware of results. Pt wife states he is still having abdominal pain I advised her to call pcp as his is not having any urinary symptoms

## 2020-10-21 NOTE — Progress Notes (Signed)
Established Patient Office Visit  Subjective:  Patient ID: Nicholas [name], male    DOB: October 24, 1945  Age: 75 y.o. MRN: [id number]  CC:  Chief Complaint  Patient presents with   Abdominal Pain    Patient states he is still having abdominal pain.    HPI [name] presents for follow up on abdominal pain. Saw urology who states symptoms are not related to bladder or kidneys. CT abdomen was negative for acute appendicitis. Renal ultrasound normal.   Abdominal pain in RLQ ongoing. It has gotten worse in the last 4-5 days. Pain is an 8/10 when moving, getting out of chair, turning, twisting. Denies fevers, nausea, vomiting. Does not remember injury. Also endorses pain in his back for the past few days. Has tried heat, takes hydrocodone twice a day for arthritis, but that doesn't help. Tens machine did not help. Does not notice pain is worse after eating.   Past Medical History:  Diagnosis Date   Anterior urethral stricture    Anxiety    Arthritis    a. knees, hips, hands;  b. 11/2013 s/p L TKA @ [place].   Bile reflux gastritis    Bulging lumbar disc    BXO (balanitis xerotica obliterans)    Complete heart block ([place])    a. s/p MDT dual chamber (His bundle) pacemaker 01/2016 Dr Caryl Comes   Depression    DVT (deep venous thrombosis) ([place])    Erosive esophagitis    Gross hematuria    Hyperlipemia    Hypertension    borderline   Internal hemorrhoids    Phimosis    Pulmonary embolism ([place])     Past Surgical History:  Procedure Laterality Date   ATRIAL FIBRILLATION ABLATION N/A 05/17/2020   Procedure: ATRIAL FIBRILLATION ABLATION;  Surgeon: Vickie Epley, MD;  Location: [facility];  Service: Cardiovascular;  Laterality: N/A;   BUNIONECTOMY Bilateral 01/06/2015   Procedure: Lillard Anes;  Surgeon: [name], MD;  Location: [facility];  Service: Orthopedics;  Laterality: Bilateral;   CARDIAC CATHETERIZATION  ~ 2005   "once"   CATARACT EXTRACTION W/ INTRAOCULAR LENS   IMPLANT, BILATERAL Bilateral ~ 2010   COLONOSCOPY WITH PROPOFOL N/A 12/07/2015   Procedure: COLONOSCOPY WITH PROPOFOL;  Surgeon: Lollie Sails, MD;  Location: [facility]COPY;  Service: Endoscopy;  Laterality: N/A;   EP IMPLANTABLE DEVICE N/A 02/16/2016   MDT dual chamber (His Bundle) pacemaker implanted by Dr Caryl Comes for intermittent complete heart block   ESOPHAGOGASTRODUODENOSCOPY (EGD) WITH PROPOFOL N/A 12/07/2015   Procedure: ESOPHAGOGASTRODUODENOSCOPY (EGD) WITH PROPOFOL;  Surgeon: Lollie Sails, MD;  Location: [facility];  Service: Endoscopy;  Laterality: N/A;   ESOPHAGOGASTRODUODENOSCOPY (EGD) WITH PROPOFOL N/A 05/17/2017   Procedure: ESOPHAGOGASTRODUODENOSCOPY (EGD) WITH PROPOFOL;  Surgeon: Lollie Sails, MD;  Location: [facility] ENDOSCOPY;  Service: Endoscopy;  Laterality: N/A;   HAMMER TOE SURGERY Bilateral 01/06/2015   Procedure: HAMMER TOE CORRECTION;  Surgeon: [name], MD;  Location: [facility] ORS;  Service: Orthopedics;  Laterality: Bilateral;   KNEE CARTILAGE SURGERY Left 1965   "football injury"   Left Total Knee Arthroplasty     a. 11/2013 [facility].   PILONIDAL CYST EXCISION  1970's   TOTAL HIP ARTHROPLASTY Right 2004   TOTAL HIP ARTHROPLASTY Left 2006   UPPER GI ENDOSCOPY      Family History  Problem Relation Age of Onset   Alcoholism Mother        died in her 33's.   Cancer Mother    Stroke  Father        deceased   Diabetes Father    Hypertension Father    Glaucoma Sister    Breast cancer Sister    Diabetes Daughter    Asthma Son    Stroke Paternal Grandmother    Prostate cancer Paternal Grandfather     Social History   Socioeconomic History   Marital status: Married    Spouse name: Not on file   Number of children: Not on file   Years of education: Not on file   Highest education level: High school graduate  Occupational History   Occupation: retired   Tobacco Use   Smoking status: Never   Smokeless tobacco: Never  Vaping Use   Vaping Use:  Never used  Substance and Sexual Activity   Alcohol use: Yes    Alcohol/week: 1.0 standard drink    Types: 1 Cans of beer per week    Comment: beers seldom    Drug use: No   Sexual activity: Not Currently  Other Topics Concern   Not on file  Social History Narrative   Lives in [place] with wife.  Does not routinely exercise.  Activity severely limited by bilateral knee pain.   Social Determinants of Health   Financial Resource Strain: Low Risk    Difficulty of Paying Living Expenses: Not hard at all  Food Insecurity: No Food Insecurity   Worried About Charity fundraiser in the Last Year: Never true   [place] in the Last Year: Never true  Transportation Needs: No Transportation Needs   Lack of Transportation (Medical): No   Lack of Transportation (Non-Medical): No  Physical Activity: Inactive   Days of Exercise per Week: 0 days   Minutes of Exercise per Session: 0 min  Stress: No Stress Concern Present   Feeling of Stress : Not at all  Social Connections: Not on file  Intimate Partner Violence: Not on file    Outpatient Medications Prior to Visit  Medication Sig Dispense Refill   acetaminophen (TYLENOL) 650 MG CR tablet Take 650 mg by mouth every 8 (eight) hours as needed for pain.     apixaban (ELIQUIS) 5 MG TABS tablet Take 1 tablet (5 mg total) by mouth 2 (two) times daily. 180 tablet 1   Ascorbic Acid (VITAMIN C) 1000 MG tablet Take 1,000 mg by mouth daily.     Cranberry 500 MG CAPS Take 500 mg by mouth daily.     Ferrous Gluconate (IRON 27 PO) Take 27 mg by mouth daily.     gabapentin (NEURONTIN) 600 MG tablet Take 1 tablet (600 mg total) by mouth at bedtime. 90 tablet 4   [START ON 10/23/2020] HYDROcodone-acetaminophen (NORCO/VICODIN) 5-325 MG tablet Take 1 tablet by mouth 2 (two) times daily. 60 tablet 0   [START ON 11/23/2020] HYDROcodone-acetaminophen (NORCO/VICODIN) 5-325 MG tablet Take 1 tablet by mouth 2 (two) times daily as needed for moderate pain or severe  pain. 60 tablet 0   hyoscyamine (LEVSIN, ANASPAZ) 0.125 MG tablet Take 0.125 mg by mouth every 6 (six) hours as needed for bladder spasms or cramping.      LORazepam (ATIVAN) 1 MG tablet Take 1 tablet (1 mg total) by mouth at bedtime. 90 tablet 0   Magnesium 400 MG TABS Take 400 mg by mouth daily.      methocarbamol (ROBAXIN) 500 MG tablet Take 1 tablet (500 mg total) by mouth 2 (two) times daily as needed for muscle spasms. [place]  tablet 2   midodrine (PROAMATINE) 10 MG tablet TAKE 1 TABLET BY MOUTH THREE TIMES A DAY 270 tablet 3   Multiple Vitamin (MULTIVITAMIN WITH MINERALS) TABS tablet Take 1 tablet by mouth daily. Centrum Silver     nystatin (MYCOSTATIN/NYSTOP) powder Apply 1 application topically 3 (three) times daily. 30 g 1   Omega-3 Fatty Acids (FISH OIL) 1200 MG CAPS Take 1,200 mg by mouth daily.     oxymetazoline (AFRIN) 0.05 % nasal spray Place 2 sprays into both nostrils at bedtime.     pantoprazole (PROTONIX) 20 MG tablet TAKE 2 TABLETS (40 MG TOTAL) BY MOUTH DAILY. (Patient taking differently: Take 20 mg by mouth daily.) 90 tablet 4   potassium chloride SA (KLOR-CON) 20 MEQ tablet Take 2 tablets (40 meq) x 1 dose as directed (Patient taking differently: Take 40 mEq by mouth daily as needed (with torsemide (fluid retention)).) 10 tablet 0   QUEtiapine Fumarate (SEROQUEL XR) 150 MG 24 hr tablet TAKE 1 TABLET (150 MG TOTAL) BY MOUTH AT BEDTIME. 90 tablet 4   rOPINIRole (REQUIP) 0.25 MG tablet Take 0.25 mg by mouth 4 (four) times daily.     rosuvastatin (CRESTOR) 40 MG tablet Take 1 tablet (40 mg total) by mouth daily. 90 tablet 4   solifenacin (VESICARE) 10 MG tablet TAKE 1 TABLET BY MOUTH EVERY DAY 90 tablet 1   torsemide (DEMADEX) 20 MG tablet Take 40 mg by mouth daily as needed (fluid retention).     triamcinolone cream (KENALOG) 0.1 % Apply 1 application topically 2 (two) times daily as needed (skin irritation).     venlafaxine XR (EFFEXOR XR) 75 MG 24 hr capsule Take 3 capsules (225 mg  total) by mouth daily. 270 capsule 4   fludrocortisone (FLORINEF) 0.1 MG tablet Take 0.1 mg by mouth daily. (Patient not taking: Reported on 10/22/2020)     No facility-administered medications prior to visit.    No Known Allergies  ROS Review of Systems  Constitutional:  Positive for fatigue. Negative for fever.  Respiratory:  Positive for shortness of breath (with exertion).   Cardiovascular: Negative.   Gastrointestinal:  Positive for abdominal pain. Negative for constipation, diarrhea, nausea and vomiting.  Genitourinary: Negative.   Musculoskeletal:  Positive for back pain.  Skin: Negative.   Neurological: Negative.      Objective:    Physical Exam Vitals and nursing note reviewed.  Constitutional:      General: He is not in acute distress.    Appearance: Normal appearance.  HENT:     Head: Normocephalic.  Eyes:     Conjunctiva/sclera: Conjunctivae normal.  Cardiovascular:     Rate and Rhythm: Normal rate and regular rhythm.     Pulses: Normal pulses.     Heart sounds: Normal heart sounds.  Pulmonary:     Effort: Pulmonary effort is normal.     Breath sounds: Normal breath sounds.  Abdominal:     General: Bowel sounds are normal.     Palpations: Abdomen is soft. There is no mass.     Tenderness: There is abdominal tenderness (RLQ). There is no guarding or rebound.  Musculoskeletal:     Cervical back: Normal range of motion.  Skin:    General: Skin is warm and dry.  Neurological:     General: No focal deficit present.     Mental Status: He is alert and oriented to person, place, and time.  Psychiatric:        Mood and Affect: Mood  normal.        Behavior: Behavior normal.        Thought Content: Thought content normal.        Judgment: Judgment normal.    BP 136/84   Pulse 92   Temp 97.9 F (36.6 C) (Oral)   Ht 5' 10.98" (1.803 m)   Wt 283 lb (128.4 kg)   SpO2 96%   BMI 39.49 kg/m  Wt Readings from Last 3 Encounters:  10/22/20 283 lb (128.4 kg)   10/05/20 280 lb (127 kg)  10/05/20 279 lb (126.6 kg)     Health Maintenance Due  Topic Date Due   COVID-19 Vaccine (4 - Booster for Moderna series) 02/23/2020    There are no preventive care reminders to display for this patient.  Lab Results  Component Value Date   TSH 2.180 08/11/2020   Lab Results  Component Value Date   WBC 5.4 09/20/2020   HGB 15.5 09/20/2020   HCT 47.1 09/20/2020   MCV 92 09/20/2020   PLT 167 09/20/2020   Lab Results  Component Value Date   NA 140 09/20/2020   K 4.6 09/20/2020   CO2 24 09/20/2020   GLUCOSE 152 (H) 09/20/2020   BUN 14 09/20/2020   CREATININE 0.90 09/20/2020   BILITOT 0.8 09/20/2020   ALKPHOS 68 09/20/2020   AST 28 09/20/2020   ALT 20 09/20/2020   PROT 7.5 09/20/2020   ALBUMIN 4.8 (H) 09/20/2020   CALCIUM 9.9 09/20/2020   ANIONGAP 9 04/08/2020   EGFR 74 09/20/2020   Lab Results  Component Value Date   CHOL 140 08/11/2020   Lab Results  Component Value Date   HDL 54 08/11/2020   Lab Results  Component Value Date   LDLCALC 65 08/11/2020   Lab Results  Component Value Date   TRIG 118 08/11/2020   Lab Results  Component Value Date   CHOLHDL 3.6 07/16/2018   Lab Results  Component Value Date   HGBA1C 6.5 08/11/2020      Assessment & Plan:   Problem List Items Addressed This Visit       Other   Right lower quadrant abdominal pain    Ongoing. CT abdomen negative for acute appendicitis and possible kidney stones. He was sent to urology who stated that it most likely wasn't kidney stones and renal ultrasound was normal. With pain worse with movement, bending, and twisting, is most likely musculoskeletal. He can try ice along with heat. Stretches were given that he can do daily, as long as it doesn't cause too much pain. He has an appointment with his orthopedist on Monday. Encouraged him to keep this appointment. Follow-up in 1 month or sooner with any concerns.       Other Visit Diagnoses     Need for  influenza vaccination    -  Primary   Flu vaccine given today   Relevant Orders   Flu Vaccine QUAD High Dose(Fluad) (Completed)       No orders of the defined types were placed in this encounter.   Follow-up: Return if symptoms worsen or fail to improve.    Charyl Dancer, NP

## 2020-10-22 ENCOUNTER — Encounter: Payer: Self-pay | Admitting: Nurse Practitioner

## 2020-10-22 ENCOUNTER — Ambulatory Visit (INDEPENDENT_AMBULATORY_CARE_PROVIDER_SITE_OTHER): Payer: Medicare Other | Admitting: Nurse Practitioner

## 2020-10-22 ENCOUNTER — Other Ambulatory Visit: Payer: Self-pay

## 2020-10-22 VITALS — BP 136/84 | HR 92 | Temp 97.9°F | Ht 70.98 in | Wt 283.0 lb

## 2020-10-22 DIAGNOSIS — Z23 Encounter for immunization: Secondary | ICD-10-CM | POA: Diagnosis not present

## 2020-10-22 DIAGNOSIS — R1031 Right lower quadrant pain: Secondary | ICD-10-CM

## 2020-10-22 NOTE — Assessment & Plan Note (Signed)
Ongoing. CT abdomen negative for acute appendicitis and possible kidney stones. He was sent to urology who stated that it most likely wasn't kidney stones and renal ultrasound was normal. With pain worse with movement, bending, and twisting, is most likely musculoskeletal. He can try ice along with heat. Stretches were given that he can do daily, as long as it doesn't cause too much pain. He has an appointment with his orthopedist on Monday. Encouraged him to keep this appointment. Follow-up in 1 month or sooner with any concerns.

## 2020-10-23 ENCOUNTER — Other Ambulatory Visit: Payer: Self-pay | Admitting: Nurse Practitioner

## 2020-10-23 NOTE — Telephone Encounter (Signed)
Too soon Last RF 11/10/19 #90 4 RF

## 2020-10-25 ENCOUNTER — Ambulatory Visit (INDEPENDENT_AMBULATORY_CARE_PROVIDER_SITE_OTHER): Payer: Medicare Other

## 2020-10-25 DIAGNOSIS — E785 Hyperlipidemia, unspecified: Secondary | ICD-10-CM

## 2020-10-25 DIAGNOSIS — I480 Paroxysmal atrial fibrillation: Secondary | ICD-10-CM

## 2020-10-25 DIAGNOSIS — I10 Essential (primary) hypertension: Secondary | ICD-10-CM

## 2020-10-25 NOTE — Progress Notes (Signed)
Chronic Care Management Pharmacy Note  10/25/2020 Name:  Nicholas [name] MRN:  [id number] DOB:  11-11-1945  Summary: No rx changes, does not qualify for eliquis patient assistance  Subjective: Nicholas [name] is an 75 y.o. year old male who is a primary patient of Cannady, Barbaraann Faster, NP.  The CCM team was consulted for assistance with disease management and care coordination needs.    Engaged with patient by telephone for follow up visit in response to provider referral for pharmacy case management and/or care coordination services.   Consent to Services:  The patient was given information about Chronic Care Management services, agreed to services, and gave verbal consent prior to initiation of services.  Please see initial visit note for detailed documentation.   Patient Care Team: Venita Lick, NP as PCP - General (Nurse Practitioner) Vickie Epley, MD as PCP - Electrophysiology (Cardiology) [name], MD (Specialist) Hollice Espy, MD as Consulting Physician (Urology) Tanda Rockers, MD as Consulting Physician (Pulmonary Disease) [name], MD as Consulting Physician (Anesthesiology) Deboraha Sprang, MD as Consulting Physician (Cardiology) Greg Cutter, LCSW as Social Worker (Licensed Clinical Social Worker) Vladimir Faster [facility] (Pharmacist)  Hospital visits: None in previous 6 months  Objective:  Lab Results  Component Value Date   CREATININE 0.90 09/20/2020   CREATININE 1.05 09/20/2020   CREATININE 1.18 08/11/2020    Lab Results  Component Value Date   HGBA1C 6.5 08/11/2020   HGBA1C 5.9 02/11/2020   HGBA1C 6.3 (H) 11/10/2019   Last diabetic Eye exam: No results found for: HMDIABEYEEXA  Last diabetic Foot exam: No results found for: HMDIABFOOTEX      Component Value Date/Time   CHOL 140 08/11/2020 0843   CHOL 171 02/11/2020 0942   CHOL 192 11/10/2019 0845   CHOL 198 01/03/2018 1610   CHOL 181 08/15/2016 0000   CHOL 178 10/20/2014 1600    TRIG 118 08/11/2020 0843   TRIG 147 02/11/2020 0942   TRIG 222 (H) 11/10/2019 0845   TRIG 264 (H) 01/03/2018 1610   TRIG 107 08/15/2016 0000   TRIG 147 10/20/2014 1600   HDL 54 08/11/2020 0843   HDL 61 02/11/2020 0942   HDL 48 11/10/2019 0845   CHOLHDL 3.6 07/16/2018 0813   VLDL 53 (H) 01/03/2018 1610   [place] 65 08/11/2020 0843   [place] 85 02/11/2020 0942   [place] 106 (H) 11/10/2019 0845    Hepatic Function Latest Ref Rng & Units 09/20/2020 11/22/2019 11/10/2019  Total Protein 6.0 - 8.5 g/dL 7.5 7.1 7.1  Albumin 3.7 - 4.7 g/dL 4.8(H) 4.2 4.6  AST 0 - 40 IU/L '28 25 22  ' ALT 0 - 44 IU/L '20 19 20  ' Alk Phosphatase 44 - 121 IU/L 68 44 64  Total Bilirubin 0.0 - 1.2 mg/dL 0.8 0.6 0.7  Bilirubin, Direct 0.0 - 0.3 mg/dL - - -    Lab Results  Component Value Date/Time   TSH 2.180 08/11/2020 08:43 AM   TSH 4.140 11/10/2019 08:45 AM   CBC Latest Ref Rng & Units 09/20/2020 05/11/2020 04/08/2020  WBC 3.4 - 10.8 x10E3/uL 5.4 6.5 6.0  Hemoglobin 13.0 - 17.7 g/dL 15.5 15.5 15.9  Hematocrit 37.5 - 51.0 % 47.1 46.5 48.1  Platelets 150 - 450 x10E3/uL 167 168 171    No results found for: VD25OH  Clinical ASCVD:  The 10-year ASCVD risk score (Arnett DK, et al., 2019) is: 27.4%   Values used to calculate the score:  Age: 42 years     Sex: Male     Is Non-Hispanic African American: No     Diabetic: No     Tobacco smoker: No     Systolic Blood Pressure: 528 mmHg     Is BP treated: Yes     HDL Cholesterol: 54 mg/dL     Total Cholesterol: 140 mg/dL   Social History   Tobacco Use  Smoking Status Never  Smokeless Tobacco Never   BP Readings from Last 3 Encounters:  10/22/20 136/84  10/05/20 (!) 163/80  10/05/20 128/90   Pulse Readings from Last 3 Encounters:  10/22/20 92  10/05/20 83  10/05/20 82   Wt Readings from Last 3 Encounters:  10/22/20 283 lb (128.4 kg)  10/05/20 280 lb (127 kg)  10/05/20 279 lb (126.6 kg)   BMI Readings from Last 3 Encounters:  10/22/20  39.49 kg/m  10/05/20 39.05 kg/m  10/05/20 38.91 kg/m    Assessment: Review of patient past medical history, allergies, medications, health status, including review of consultants reports, laboratory and other test data, was performed as part of comprehensive evaluation and provision of chronic care management services.   SDOH:  (Social Determinants of Health) assessments and interventions performed: Yes   CCM Care Plan  No Known Allergies  Medications Reviewed Today     Reviewed by Madelin Rear, [facility] (Pharmacist) on 10/25/20 at [place] List Status: <None>   Medication Order Taking? Sig Documenting Provider Last Dose Status Informant  acetaminophen (TYLENOL) 650 MG CR tablet [id number] Yes Take 650 mg by mouth every 8 (eight) hours as needed for pain. [provider] Taking Active Spouse/Significant Other  apixaban (ELIQUIS) 5 MG TABS tablet [id number] Yes Take 1 tablet (5 mg total) by mouth 2 (two) times daily. Deboraha Sprang, MD Taking Active   Ascorbic Acid (VITAMIN C) 1000 MG tablet [id number] Yes Take 1,000 mg by mouth daily. [provider] Taking Active Spouse/Significant Other  Cranberry 500 MG CAPS [id number] Yes Take 500 mg by mouth daily. [provider] Taking Active Spouse/Significant Other  Ferrous Gluconate (IRON 27 PO) [id number] Yes Take 27 mg by mouth daily. [provider] Taking Active Spouse/Significant Other  fludrocortisone (FLORINEF) 0.1 MG tablet [id number] Yes Take 0.1 mg by mouth daily. [provider] Taking Active   gabapentin (NEURONTIN) 600 MG tablet [id number] Yes Take 1 tablet (600 mg total) by mouth at bedtime. [name] T, NP Taking Active Spouse/Significant Other  HYDROcodone-acetaminophen (NORCO/VICODIN) 5-325 MG tablet [id number] Yes Take 1 tablet by mouth 2 (two) times daily. [name], MD Taking Active   HYDROcodone-acetaminophen (NORCO/VICODIN) 5-325 MG tablet [id number]  Take 1 tablet by mouth 2  (two) times daily as needed for moderate pain or severe pain. [name], MD  Active   hyoscyamine Burnard Leigh, ANASPAZ) 0.125 MG tablet [id number] Yes Take 0.125 mg by mouth every 6 (six) hours as needed for bladder spasms or cramping.  [provider] Taking Active Spouse/Significant Other           Med Note Caryn Section, [place] A   Fri Feb 11, 2016 11:33 AM)    LORazepam (ATIVAN) 1 MG tablet [id number] Yes Take 1 tablet (1 mg total) by mouth at bedtime. [name] T, NP Taking Active   Magnesium 400 MG TABS [id number] Yes Take 400 mg by mouth daily.  [provider] Taking Active Spouse/Significant Other  methocarbamol (ROBAXIN) 500 MG tablet [id number] No Take 1 tablet (500 mg total) by  mouth 2 (two) times daily as needed for muscle spasms.  Patient not taking: Reported on 10/25/2020   [name], MD Not Taking Active Spouse/Significant Other  midodrine (PROAMATINE) 10 MG tablet [id number] Yes TAKE 1 TABLET BY MOUTH THREE TIMES A DAY Deboraha Sprang, MD Taking Active   Multiple Vitamin (MULTIVITAMIN WITH MINERALS) TABS tablet [id number] Yes Take 1 tablet by mouth daily. Centrum Silver [provider] Taking Active Spouse/Significant Other  nystatin (MYCOSTATIN/NYSTOP) powder [id number]  Apply 1 application topically 3 (three) times daily. [name] T, NP  Active   Omega-3 Fatty Acids (FISH OIL) 1200 MG CAPS [id number] Yes Take 1,200 mg by mouth daily. [provider] Taking Active Spouse/Significant Other  oxymetazoline (AFRIN) 0.05 % nasal spray [id number] Yes Place 2 sprays into both nostrils at bedtime. [provider] Taking Active Spouse/Significant Other  pantoprazole (PROTONIX) 20 MG tablet [id number] Yes TAKE 2 TABLETS (40 MG TOTAL) BY MOUTH DAILY.  Patient taking differently: Take 20 mg by mouth daily.   Venita Lick, NP Taking Active   potassium chloride SA (KLOR-CON) 20 MEQ tablet [id number]  Take 2 tablets (40 meq) x 1 dose as directed   Patient taking differently: Take 40 mEq by mouth daily as needed (with torsemide (fluid retention)).   Deboraha Sprang, MD  Active Spouse/Significant Other  QUEtiapine Fumarate (SEROQUEL XR) 150 MG 24 hr tablet [id number] Yes TAKE 1 TABLET (150 MG TOTAL) BY MOUTH AT BEDTIME. [name] T, NP Taking Active Spouse/Significant Other  rOPINIRole (REQUIP) 0.25 MG tablet [id number] Yes Take 0.25 mg by mouth 4 (four) times daily. [provider] Taking Active Spouse/Significant Other  rosuvastatin (CRESTOR) 40 MG tablet [id number] Yes Take 1 tablet (40 mg total) by mouth daily. [name] T, NP Taking Active Spouse/Significant Other  solifenacin (VESICARE) 10 MG tablet [id number] Yes TAKE 1 TABLET BY MOUTH EVERY DAY Cannady, Jolene T, NP Taking Active   torsemide (DEMADEX) 20 MG tablet [id number]  Take 40 mg by mouth daily as needed (fluid retention). [provider]  Active Spouse/Significant Other  triamcinolone cream (KENALOG) 0.1 % [id number] Yes Apply 1 application topically 2 (two) times daily as needed (skin irritation). [provider] Taking Active Spouse/Significant Other  venlafaxine XR (EFFEXOR XR) 75 MG 24 hr capsule [id number] Yes Take 3 capsules (225 mg total) by mouth daily. Venita Lick, NP Taking Active   Med List Note Dewayne Shorter, RN 10/13/20 0913): UDS 06-22-17 MR 12-23-2020 When searching in control substance database search exactly this: Jeevan Platter jr. 03-06-18 Hydrocodone approved 02/03/18-03/05/19.             Patient Active Problem List   Diagnosis Date Noted   Right lower quadrant abdominal pain 09/20/2020   S/P ablation of atrial fibrillation 05/17/2020   OSA (obstructive sleep apnea) 04/16/2020   Secondary hypercoagulable state ([place]) 02/07/2020   Abdominal aortic aneurysm (AAA) 02/07/2020   Restless leg syndrome 11/10/2019   PAF (paroxysmal atrial fibrillation) ([place]) 10/14/2019   Pacemaker - MDT 10/14/2019   Morbid obesity ([place])  07/01/2019   Long-term current use of benzodiazepine 03/26/2019   Stage 3a chronic kidney disease ([place]) 03/26/2019   IFG (impaired fasting glucose) 03/26/2019   BPH (benign prostatic hyperplasia) 07/11/2018   Chronic, continuous use of opioids 07/05/2018   Chronic pain syndrome 07/05/2018   Bilateral hand pain 07/05/2018   Facet syndrome, lumbar 07/05/2018   Lumbar radiculopathy 06/05/2017   Complete heart block ([place]) 02/16/2016   History of total knee  replacement, bilateral 08/10/2015   Aortic atherosclerosis ([place]) 05/11/2015   Depression, recurrent ([place]) 10/20/2014   Anxiety 10/20/2014   Chronic orthostatic hypotension 04/17/2013   HTN (hypertension) 04/17/2013   Dyslipidemia 04/17/2013   GERD (gastroesophageal reflux disease) 04/17/2013   History of pulmonary embolism 04/17/2013   Advanced care planning/counseling discussion 04/14/2013    Immunization History  Administered Date(s) Administered   Fluad Quad(high Dose 65+) 10/22/2020   Influenza Split 11/13/2013   Influenza Whole 11/09/2015   Influenza, High Dose Seasonal PF 11/16/2015, 11/14/2016, 09/27/2017, 09/28/2018, 10/14/2019   Influenza,inj,Quad PF,6+ Mos 10/20/2014   Influenza-Unspecified 09/23/2012, 03/21/2018   Moderna Sars-Covid-2 Vaccination 03/07/2019, 04/07/2019, 12/01/2019   Pneumococcal Conjugate-13 04/28/2014, 09/28/2018   Pneumococcal Polysaccharide-23 04/10/2013, 10/14/2019   Pneumococcal-Unspecified 10/01/2008   Td 01/05/2005   Tdap 06/11/2017    Conditions to be addressed/monitored: Afib, HLD, HTN, Anxiety, MDD, AAA  Care Plan : ccm pharmacy care plan  Updates made by Madelin Rear, [place] since 10/25/2020 12:00 AM     Problem: afid hld htn gad mdd aaa   Priority: High     Long-Range Goal: disease management   Start Date: 10/25/2020  Expected End Date: 10/25/2021  This Visit's Progress: On track  Priority: High  Note:   Current Barriers:  Unable to independently afford treatment regimen Unable  to independently monitor therapeutic efficacy  Pharmacist Clinical Goal(s):  Patient will verbalize ability to afford treatment regimen through collaboration with PharmD and provider.   Interventions: 1:1 collaboration with Venita Lick, NP regarding development and update of comprehensive plan of care as evidenced by provider attestation and co-signature Inter-disciplinary care team collaboration (see longitudinal plan of care) Comprehensive medication review performed; medication list updated in electronic medical record  Hypertension (BP goal <140/90) -Controlled -CKD3, OSA -Chronic orthostatic hypotension -Current treatment: Telmisartan 20 mg - two tabs as needed for fluid retention -Medications previously tried: azor  -Current home readings: only checking when symptomatic  -Current dietary habits: cutting down on junk foods -Current exercise habits: limited due to mobility issues/pain  -Denies hypotensive/hypertensive symptoms -Educated on BP goals and benefits of medications for prevention of heart attack, stroke and kidney damage; -Counseled to monitor BP at home 1-2x/wk, document, and provide log at future appointments -Recommended to continue current medication  Hyperlipidemia: (LDL goal < 100) -Controlled -Current treatment: Rosuvastatin 40 mg once daily  -Side effect review - no problems noted  -Educated on Cholesterol goals;  Benefits of statin for ASCVD risk reduction; -Recommended to continue current medication  Atrial Fibrillation (Goal: prevent stroke and major bleeding) -Controlled -s/p ablation 2022 -Current treatment: Rate control: n/a Anticoagulation: Eliquis 5 mg twice daily  -Medications previously tried: flecanide -Home BP and HR readings: none provded  -Counseled on increased risk of stroke due to Afib and benefits of anticoagulation for stroke prevention; Reviewed finances - would not qualify for patient assistance due to lack of OOP  spend -Recommended to continue current medication  Patient Goals/Self-Care Activities Patient will:  - take medications as prescribed collaborate with provider on medication access solutions  Medication Assistance: None at this time due to not qualify for eliquis patient assistance.  Patient's preferred pharmacy is:  [id number] [place] [address] [id number]: [phone]: [phone]  Pt endorses 100% compliance  Follow Up:  Patient agrees to Care Plan and Follow-up. Plan: HC 3 month. Pharmacist 6 month telephone       Future Appointments  Date Time Provider  [place]  11/12/2020  8:00 AM [name] T, NP [facility]  12/06/2020 10:00 AM Sueanne Margarita, MD [facility]  12/13/2020 11:05 AM [facility] DEVICE REMOTES [facility]  03/14/2021 11:05 AM [facility] DEVICE REMOTES [facility]  04/05/2021  9:00 AM Deboraha Sprang, MD [facility]  05/02/2021  8:15 AM [facility]FP NURSE HEALTH ADVISOR [facility]  05/09/2021  9:00 AM CFP CCM PHARMACY [facility]  06/13/2021 11:05 AM [facility] DEVICE REMOTES [facility]    Madelin Rear, PharmD, BCGP Clinical Pharmacist  [phone]

## 2020-10-27 ENCOUNTER — Telehealth: Payer: Self-pay

## 2020-10-27 MED ORDER — APIXABAN 5 MG PO TABS: 5.0000 mg | ORAL_TABLET | Freq: Two times a day (BID) | ORAL | 1 refills | Status: DC

## 2020-10-27 NOTE — Telephone Encounter (Signed)
*  STAT* If patient is at the pharmacy, call can be transferred to refill team.   1. Which medications need to be refilled? (please list name of each medication and dose if known) Eliquis 5mg  tablets   2. Which pharmacy/location (including street and city if local pharmacy) is medication to be sent to? [facility]  Ph: [phone]  Fax: [phone]  3. Do they need a 30 day or 90 day supply? Qty requested : 168

## 2020-10-27 NOTE — Telephone Encounter (Signed)
Eliquis 5 mg refill request received. Patient is 75 years old, weight-128.4 kg, Crea-1.05 on 09/20/20, Diagnosis-afib, and last seen by Dr. Caryl Comes on 10/05/20. Dose is appropriate based on dosing criteria. Will send in refill to requested pharmacy.

## 2020-11-01 ENCOUNTER — Other Ambulatory Visit: Payer: Self-pay

## 2020-11-01 ENCOUNTER — Telehealth: Payer: Self-pay | Admitting: Internal Medicine

## 2020-11-01 NOTE — Telephone Encounter (Signed)
Medication Samples have been provided to the patient.  Drug name: Eliquis       Strength: 5MG         Qty: 14  LOT: [id number]  Exp.Date: 06/2022  Horton Finer 10:15 AM 11/01/2020   Patient has been informed that he may pick samples up from front office.

## 2020-11-01 NOTE — Telephone Encounter (Signed)
Please advise pt requesting samples.

## 2020-11-01 NOTE — Telephone Encounter (Signed)
Eliquis  5 mg samples request received. Patient is 75 years old, weight-128.4 kg, Crea-1.05 on 09/20/20, Diagnosis-afib, and last seen by Dr. Caryl Comes on 10/05/20. Dose is appropriate based on dosing criteria. Ok to provide samples.

## 2020-11-01 NOTE — Telephone Encounter (Addendum)
Prescription refill request for Eliquis received from [facility]. Verbal order from Dr Caryl Comes to have medication refilled through [facility]. Attempted to fax prescription 4 times, as well as called pharmacy to confirm the correct fax number. Called and spoke with pt and pt's wife. Wife stated they already had a written prescription from Dr Caryl Comes and had already sent this to requested pharmacy and medication should be delivered soon. Disregarded Eliquis refill request received via fax.

## 2020-11-01 NOTE — Telephone Encounter (Signed)
.  Patient calling the office for samples of medication:   1.  What medication and dosage are you requesting samples for? Eliquis 5 MG  2.  Are you currently out of this medication? Yes, still waiting on medication from [facility]

## 2020-11-04 ENCOUNTER — Ambulatory Visit: Payer: Medicare Other | Admitting: Surgery

## 2020-11-04 ENCOUNTER — Encounter: Payer: Self-pay | Admitting: Surgery

## 2020-11-04 ENCOUNTER — Other Ambulatory Visit: Payer: Self-pay

## 2020-11-04 VITALS — BP 155/92 | HR 87 | Temp 98.5°F | Ht 71.0 in | Wt 279.2 lb

## 2020-11-04 DIAGNOSIS — R1031 Right lower quadrant pain: Secondary | ICD-10-CM

## 2020-11-04 NOTE — Progress Notes (Signed)
Patient ID: Nicholas [name], male   DOB: Mar 12, 1945, 75 y.o.   MRN: [id number]  Chief Complaint: Right groin pain  History of Present Illness Nicholas [name] is a 75 y.o. male with a recent history of right groin pain which apparently began in March and went away but has come back over the last 2 to 3 weeks.  He reports a fairly sharp pain that is associated/exacerbated by sitting in a chair, arising from a chair, getting in or out of a car.  It appears to be in the right lower quadrant, there is no bulge noted.  There is no pain exacerbation through coughing or straining.  It is made worse through twisting, he feared it might be exacerbated with a surprise sneeze and tried to catch himself but it did not actually provoke the pain.  He wants to get more active, he is very conscientious about the additional weight he has gained and has had recent evaluation of his right hip to ensure that that was not a source.  Past Medical History Past Medical History:  Diagnosis Date   Anterior urethral stricture    Anxiety    Arthritis    a. knees, hips, hands;  b. 11/2013 s/p L TKA @ [facility].   Bile reflux gastritis    Bulging lumbar disc    BXO (balanitis xerotica obliterans)    Complete heart block ([place])    a. s/p MDT dual chamber (His bundle) pacemaker 01/2016 Dr Caryl Comes   Depression    DVT (deep venous thrombosis) ([place])    Erosive esophagitis    Gross hematuria    Hyperlipemia    Hypertension    borderline   Internal hemorrhoids    Phimosis    Pulmonary embolism ([place])       Past Surgical History:  Procedure Laterality Date   ATRIAL FIBRILLATION ABLATION N/A 05/17/2020   Procedure: ATRIAL FIBRILLATION ABLATION;  Surgeon: Vickie Epley, MD;  Location: [facility];  Service: Cardiovascular;  Laterality: N/A;   BUNIONECTOMY Bilateral 01/06/2015   Procedure: Lillard Anes;  Surgeon: [name], MD;  Location: [facility];  Service: Orthopedics;  Laterality: Bilateral;   CARDIAC CATHETERIZATION   ~ 2005   "once"   CATARACT EXTRACTION W/ INTRAOCULAR LENS  IMPLANT, BILATERAL Bilateral ~ 2010   COLONOSCOPY WITH PROPOFOL N/A 12/07/2015   Procedure: COLONOSCOPY WITH PROPOFOL;  Surgeon: Lollie Sails, MD;  Location[facility] ENDOSCOPY;  Service: Endoscopy;  Laterality: N/A;   EP IMPLANTABLE DEVICE N/A 02/16/2016   MDT dual chamber (His Bundle) pacemaker implanted by Dr Caryl Comes for intermittent complete heart block   ESOPHAGOGASTRODUODENOSCOPY (EGD) WITH PROPOFOL N/A 12/07/2015   Procedure: ESOPHAGOGASTRODUODENOSCOPY (EGD) WITH PROPOFOL;  Surgeon: Lollie Sails, MD;  Location: [facility] ENDOSCOPY;  Service: Endoscopy;  Laterality: N/A;   ESOPHAGOGASTRODUODENOSCOPY (EGD) WITH PROPOFOL N/A 05/17/2017   Procedure: ESOPHAGOGASTRODUODENOSCOPY (EGD) WITH PROPOFOL;  Surgeon: Lollie Sails, MD;  Location: [facility] ENDOSCOPY;  Service: Endoscopy;  Laterality: N/A;   HAMMER TOE SURGERY Bilateral 01/06/2015   Procedure: HAMMER TOE CORRECTION;  Surgeon: [name], MD;  Location: [facility];  Service: Orthopedics;  Laterality: Bilateral;   KNEE CARTILAGE SURGERY Left 1965   "football injury"   Left Total Knee Arthroplasty     a. 11/2013 [facility].   PACEMAKER IMPLANT  2017   PILONIDAL CYST EXCISION  1970's   TOTAL HIP ARTHROPLASTY Right 2004   TOTAL HIP ARTHROPLASTY Left 2006   UPPER GI ENDOSCOPY      No Known  Allergies  Current Outpatient Medications  Medication Sig Dispense Refill   acetaminophen (TYLENOL) 650 MG CR tablet Take 650 mg by mouth every 8 (eight) hours as needed for pain.     apixaban (ELIQUIS) 5 MG TABS tablet Take 1 tablet (5 mg total) by mouth 2 (two) times daily. 180 tablet 1   Ascorbic Acid (VITAMIN C) 1000 MG tablet Take 1,000 mg by mouth daily.     Cranberry 500 MG CAPS Take 500 mg by mouth daily.     Ferrous Gluconate (IRON 27 PO) Take 27 mg by mouth daily.     fludrocortisone (FLORINEF) 0.1 MG tablet Take 0.1 mg by mouth daily.     gabapentin (NEURONTIN) 600 MG tablet Take 1  tablet (600 mg total) by mouth at bedtime. 90 tablet 4   HYDROcodone-acetaminophen (NORCO/VICODIN) 5-325 MG tablet Take 1 tablet by mouth 2 (two) times daily. 60 tablet 0   [START ON 11/23/2020] HYDROcodone-acetaminophen (NORCO/VICODIN) 5-325 MG tablet Take 1 tablet by mouth 2 (two) times daily as needed for moderate pain or severe pain. 60 tablet 0   hyoscyamine (LEVSIN, ANASPAZ) 0.125 MG tablet Take 0.125 mg by mouth every 6 (six) hours as needed for bladder spasms or cramping.      LORazepam (ATIVAN) 1 MG tablet Take 1 tablet (1 mg total) by mouth at bedtime. 90 tablet 0   Magnesium 400 MG TABS Take 400 mg by mouth daily.      methocarbamol (ROBAXIN) 500 MG tablet Take 1 tablet (500 mg total) by mouth 2 (two) times daily as needed for muscle spasms. 60 tablet 2   midodrine (PROAMATINE) 10 MG tablet TAKE 1 TABLET BY MOUTH THREE TIMES A DAY 270 tablet 3   Multiple Vitamin (MULTIVITAMIN WITH MINERALS) TABS tablet Take 1 tablet by mouth daily. Centrum Silver     nystatin (MYCOSTATIN/NYSTOP) powder Apply 1 application topically 3 (three) times daily. 30 g 1   Omega-3 Fatty Acids (FISH OIL) 1200 MG CAPS Take 1,200 mg by mouth daily.     oxymetazoline (AFRIN) 0.05 % nasal spray Place 2 sprays into both nostrils at bedtime.     pantoprazole (PROTONIX) 20 MG tablet TAKE 2 TABLETS (40 MG TOTAL) BY MOUTH DAILY. (Patient taking differently: Take 20 mg by mouth daily.) 90 tablet 4   potassium chloride SA (KLOR-CON) 20 MEQ tablet Take 2 tablets (40 meq) x 1 dose as directed (Patient taking differently: Take 40 mEq by mouth daily as needed (with torsemide (fluid retention)).) 10 tablet 0   QUEtiapine Fumarate (SEROQUEL XR) 150 MG 24 hr tablet TAKE 1 TABLET (150 MG TOTAL) BY MOUTH AT BEDTIME. 90 tablet 4   rOPINIRole (REQUIP) 0.25 MG tablet Take 0.25 mg by mouth 4 (four) times daily.     rosuvastatin (CRESTOR) 40 MG tablet Take 1 tablet (40 mg total) by mouth daily. 90 tablet 4   solifenacin (VESICARE) 10 MG  tablet TAKE 1 TABLET BY MOUTH EVERY DAY 90 tablet 1   torsemide (DEMADEX) 20 MG tablet Take 40 mg by mouth daily as needed (fluid retention).     triamcinolone cream (KENALOG) 0.1 % Apply 1 application topically 2 (two) times daily as needed (skin irritation).     venlafaxine XR (EFFEXOR XR) 75 MG 24 hr capsule Take 3 capsules (225 mg total) by mouth daily. 270 capsule 4   No current facility-administered medications for this visit.    Family History Family History  Problem Relation Age of Onset   Alcoholism Mother  died in her 61's.   Cancer Mother    Stroke Father        deceased   Diabetes Father    Hypertension Father    Glaucoma Sister    Breast cancer Sister    Diabetes Daughter    Asthma Son    Stroke Paternal Grandmother    Prostate cancer Paternal Grandfather       Social History Social History   Tobacco Use   Smoking status: Never   Smokeless tobacco: Never  Vaping Use   Vaping Use: Never used  Substance Use Topics   Alcohol use: Yes    Alcohol/week: 1.0 standard drink    Types: 1 Cans of beer per week    Comment: beers seldom    Drug use: No        Review of Systems  Constitutional: Negative.   HENT:  Positive for hearing loss and tinnitus.   Eyes: Negative.   Respiratory:  Positive for shortness of breath.   Cardiovascular: Negative.   Gastrointestinal:  Positive for abdominal pain.  Genitourinary: Negative.   Skin: Negative.   Neurological: Negative.   Psychiatric/Behavioral:  Positive for depression.      Physical Exam Blood pressure (!) 155/92, pulse 87, temperature 98.5 F (36.9 C), height 5\' 11"  (1.803 m), weight 279 lb 3.2 oz (126.6 kg), SpO2 91 %. Last Weight  Most recent update: 11/04/2020  2:30 PM    Weight  126.6 kg (279 lb 3.2 oz)             CONSTITUTIONAL: Well developed, and nourished, appropriately responsive and aware without distress.   EYES: Sclera non-icteric.   EARS, NOSE, MOUTH AND THROAT: Mask worn.     Hearing is intact to voice.  NECK: Trachea is midline, and there is no jugular venous distension.  LYMPH NODES: No appreciable cervical masses. RESPIRATORY:  Normal respiratory effort without pathologic use of accessory muscles. CARDIOVASCULAR: Heart is regular in rate and rhythm. GI: The abdomen is soft, nontender, and nondistended. There were no palpable masses.  Focused evaluation of the right lower quadrant did not reveal any masses.  He was tender with direct palpation to the anterior superior iliac spine and the lateral inguinal ligament. GU: Did not appreciate any groin weakness with Valsalva, and no hernia is found.  I also reviewed his recent CT scan and could not appreciate any hernia on that evaluation either. MUSCULOSKELETAL:  Symmetrical muscle tone appreciated in all four extremities.    SKIN: Skin turgor is normal. No pathologic skin lesions appreciated.  NEUROLOGIC:  Motor and sensation appear grossly normal.  Cranial nerves are grossly without defect. PSYCH:  Alert and oriented to person, place and time. Affect is appropriate for situation.  Data Reviewed I have personally reviewed what is currently available of the patient's imaging, recent labs and medical records.   Labs:  CBC Latest Ref Rng & Units 09/20/2020 05/11/2020 04/08/2020  WBC 3.4 - 10.8 x10E3/uL 5.4 6.5 6.0  Hemoglobin 13.0 - 17.7 g/dL 15.5 15.5 15.9  Hematocrit 37.5 - 51.0 % 47.1 46.5 48.1  Platelets 150 - 450 x10E3/uL 167 168 171   CMP Latest Ref Rng & Units 09/20/2020 09/20/2020 08/11/2020  Glucose 65 - 99 mg/dL - 152(H) 146(H)  BUN 8 - 27 mg/dL - 14 16  Creatinine 0.61 - 1.24 mg/dL 0.90 1.05 1.18  Sodium 134 - 144 mmol/L - 140 140  Potassium 3.5 - 5.2 mmol/L - 4.6 4.6  Chloride 96 - 106 mmol/L -  100 100  CO2 20 - 29 mmol/L - 24 22  Calcium 8.6 - 10.2 mg/dL - 9.9 9.6  Total Protein 6.0 - 8.5 g/dL - 7.5 -  Total Bilirubin 0.0 - 1.2 mg/dL - 0.8 -  Alkaline Phos 44 - 121 IU/L - 68 -  AST 0 - 40 IU/L - 28 -   ALT 0 - 44 IU/L - 20 -      Imaging: CT scan from late August imaging reviewed and I concur with the report.  Specifically no evidence of any inguinal herniation on either side, no abdominal wall hernia of the right lower quadrant her abdomen. Within last 24 hrs: No results found.  Assessment    Right lower quadrant pain, uncertain etiology, I do not appreciate any hernia as contributing. Patient Active Problem List   Diagnosis Date Noted   Right lower quadrant abdominal pain 09/20/2020   S/P ablation of atrial fibrillation 05/17/2020   OSA (obstructive sleep apnea) 04/16/2020   Secondary hypercoagulable state ([place]) 02/07/2020   Abdominal aortic aneurysm (AAA) 02/07/2020   Restless leg syndrome 11/10/2019   PAF (paroxysmal atrial fibrillation) ([place]) 10/14/2019   Pacemaker - MDT 10/14/2019   Morbid obesity ([place]) 07/01/2019   Long-term current use of benzodiazepine 03/26/2019   Stage 3a chronic kidney disease ([place]) 03/26/2019   IFG (impaired fasting glucose) 03/26/2019   BPH (benign prostatic hyperplasia) 07/11/2018   Chronic, continuous use of opioids 07/05/2018   Chronic pain syndrome 07/05/2018   Bilateral hand pain 07/05/2018   Facet syndrome, lumbar 07/05/2018   Lumbar radiculopathy 06/05/2017   Complete heart block ([place]) 02/16/2016   History of total knee replacement, bilateral 08/10/2015   Aortic atherosclerosis ([place]) 05/11/2015   Depression, recurrent ([place]) 10/20/2014   Anxiety 10/20/2014   Chronic orthostatic hypotension 04/17/2013   HTN (hypertension) 04/17/2013   Dyslipidemia 04/17/2013   GERD (gastroesophageal reflux disease) 04/17/2013   History of pulmonary embolism 04/17/2013   Advanced care planning/counseling discussion 04/14/2013    Plan    We discussed options of pursuing physical therapy, I have no additional studies to utilize to evaluate for hernia. Dr. Ammie Ferrier office is offered to set up physical therapy. Face-to-face time spent with the  patient and accompanying care providers(if present) was 45 minutes, with more than 50% of the time spent counseling, educating, and coordinating care of the patient.    These notes generated with voice recognition software. I apologize for typographical errors.  Ronny Bacon M.D., FACS 11/04/2020, 2:34 PM

## 2020-11-04 NOTE — Patient Instructions (Signed)
Our exam today did not find a hernia or any surgical cause for your pain today.   Follow-up with our office as needed.  Please call and ask to speak with a nurse if you develop questions or concerns.

## 2020-11-08 ENCOUNTER — Telehealth: Payer: Self-pay | Admitting: Internal Medicine

## 2020-11-08 NOTE — Telephone Encounter (Signed)
Spoke with patient's wife who states she spoke with pharmacy from [place] and was informed that they have been mailed but may take up to 20 days before she receives them. She states she does have Rx for Eliquis waiting to be picked up at [facility] that she can get if they do not come in the mail tomorrow.

## 2020-11-08 NOTE — Telephone Encounter (Signed)
He was supposed to be getting a RX from [place]. I thought we had given him a prescription for this to send to them. We cannot electronically send to [place].  Do you mind calling them to find out the status on this? Otherwise, he will need to call [phone]. If he is almost out of medication, he can have 1 box of samples if available.

## 2020-11-08 NOTE — Telephone Encounter (Signed)
Patient requesting samples but was just given samples one month ago. Would this patient qualify for assistance? Thanks!

## 2020-11-08 NOTE — Telephone Encounter (Signed)
Patient's wife states he needs Eliquis samples.

## 2020-11-08 NOTE — Telephone Encounter (Signed)
Patient requesting samples of Eliquis. Please advise.

## 2020-11-08 NOTE — Telephone Encounter (Signed)
Pt requesting Eliquis 5mg  samples. Indication: A fib/DVT/PE Last office visit: 10/05/20  Olin Pia MD Scr: 0.90 on 09/20/20 Age: 75 Weight: 126.6kg  Based on above findings Eliquis 5mg  twice daily is the appropriate dose.  OK to give Eliquis samples if available.

## 2020-11-12 ENCOUNTER — Ambulatory Visit: Payer: Medicare Other | Admitting: Nurse Practitioner

## 2020-11-13 ENCOUNTER — Other Ambulatory Visit: Payer: Self-pay | Admitting: Nurse Practitioner

## 2020-11-14 NOTE — Telephone Encounter (Signed)
last RF 11/10/19 #90 4 RF- should have enough to last 02/09/21

## 2020-11-16 ENCOUNTER — Encounter: Payer: Self-pay | Admitting: Nurse Practitioner

## 2020-11-16 ENCOUNTER — Ambulatory Visit (INDEPENDENT_AMBULATORY_CARE_PROVIDER_SITE_OTHER): Payer: Medicare Other | Admitting: Nurse Practitioner

## 2020-11-16 ENCOUNTER — Other Ambulatory Visit: Payer: Self-pay

## 2020-11-16 DIAGNOSIS — R1031 Right lower quadrant pain: Secondary | ICD-10-CM

## 2020-11-16 DIAGNOSIS — N1831 Chronic kidney disease, stage 3a: Secondary | ICD-10-CM

## 2020-11-16 DIAGNOSIS — Z950 Presence of cardiac pacemaker: Secondary | ICD-10-CM

## 2020-11-16 DIAGNOSIS — I480 Paroxysmal atrial fibrillation: Secondary | ICD-10-CM | POA: Diagnosis not present

## 2020-11-16 DIAGNOSIS — I442 Atrioventricular block, complete: Secondary | ICD-10-CM

## 2020-11-16 DIAGNOSIS — I10 Essential (primary) hypertension: Secondary | ICD-10-CM

## 2020-11-16 DIAGNOSIS — K219 Gastro-esophageal reflux disease without esophagitis: Secondary | ICD-10-CM

## 2020-11-16 DIAGNOSIS — R7301 Impaired fasting glucose: Secondary | ICD-10-CM

## 2020-11-16 DIAGNOSIS — G2581 Restless legs syndrome: Secondary | ICD-10-CM

## 2020-11-16 DIAGNOSIS — N400 Enlarged prostate without lower urinary tract symptoms: Secondary | ICD-10-CM

## 2020-11-16 DIAGNOSIS — G894 Chronic pain syndrome: Secondary | ICD-10-CM

## 2020-11-16 DIAGNOSIS — F419 Anxiety disorder, unspecified: Secondary | ICD-10-CM

## 2020-11-16 DIAGNOSIS — E785 Hyperlipidemia, unspecified: Secondary | ICD-10-CM

## 2020-11-16 LAB — URINALYSIS, ROUTINE W REFLEX MICROSCOPIC
Bilirubin, UA: NEGATIVE
Glucose, UA: NEGATIVE
Ketones, UA: NEGATIVE
Nitrite, UA: NEGATIVE
RBC, UA: NEGATIVE
Specific Gravity, UA: 1.01 (ref 1.005–1.030)
Urobilinogen, Ur: 0.2 mg/dL (ref 0.2–1.0)
pH, UA: 7 (ref 5.0–7.5)

## 2020-11-16 LAB — MICROSCOPIC EXAMINATION
Bacteria, UA: NONE SEEN
Epithelial Cells (non renal): NONE SEEN /hpf (ref 0–10)
RBC, Urine: NONE SEEN /hpf (ref 0–2)
WBC, UA: NONE SEEN /hpf (ref 0–5)

## 2020-11-16 LAB — BAYER DCA HB A1C WAIVED: HB A1C (BAYER DCA - WAIVED): 6 % — ABNORMAL HIGH (ref 4.8–5.6)

## 2020-11-16 NOTE — Assessment & Plan Note (Addendum)
Chronic, ongoing.  Continue current medication regimen and adjust as needed.  Lipid panel up to date. 

## 2020-11-16 NOTE — Assessment & Plan Note (Signed)
Noted on past labs -- A1c today upward trend last visit to 6.5% and urine ALB 150.  Concern for T2DM and discussed with him at length options at this time, he wishes to focus on diet vs adding medication at this time.  Recheck A1c today and will initiate medication if ongoing trend up.

## 2020-11-16 NOTE — Assessment & Plan Note (Signed)
Chronic, ongoing followed by pain clinic.  Discussed at length risk of benzo and opioid use in conjunction with each other.  Recommend he not take the two together at same hour during day or evening.

## 2020-11-16 NOTE — Assessment & Plan Note (Signed)
Chronic, ongoing.  Discussed at length risk of benzo and opioid use in conjunction with each other.  Recommend he not take the two together at same hour during day or evening.  He tried to cut back to 1/2 tablet but was unsuccessful in past.   Continue to collaborate with CCM team on education.  Provided wife and him with copy of [facility] benzo risk information patient sheet in past.  They request 90 day supply, as previous PCP supplied this.  Are aware he does have to return every 3 months for refills.  UDS up to date with pain management.  Refills sent.

## 2020-11-16 NOTE — Assessment & Plan Note (Signed)
Chronic, stable, followed by cardiology.  Continue current medication regimen as prescribed by them.  Labs up to date and ablation last on 05/17/20.

## 2020-11-16 NOTE — Assessment & Plan Note (Signed)
Followed by cardiology.  Pacemaker checks by them.

## 2020-11-16 NOTE — Progress Notes (Signed)
BP 132/78   Pulse 81   Temp 98.5 F (36.9 C) (Oral)   Wt 282 lb 12.8 oz (128.3 kg)   SpO2 95%   BMI 39.44 kg/m    Subjective:    Patient ID: Nicholas [name], male    DOB: March 11, 1945, 75 y.o.   MRN: [id number]  HPI: Nicholas [name] is a 75 y.o. male  Chief Complaint  Patient presents with   Abdominal Pain    Patient states he has been hurting in his right lower abdomen area. Patient states he has been having this pain since March. Patient states the pain has just gradually gotten worse and patient wife state patient has an appointment with Gastroenterology in December. Patient states nothing he takes helps with the pain.    Wife present at bedside with patient.  ABDOMINAL PAIN (RLQ) Having ongoing issues with this -- CT in August noted possible stone in right kidney -- he saw Dr. Caryl Pina with urology on 10/05/20 and stone was not suspected.  Renal ultrasound done which was normal.  He saw general surgery on 11/04/20 and he is to attend PT + have a GI visit.  He reports Dr. Sabra Heck told him it is not the right hip causing issues.  Continues Protonix for GERD. Duration:months Onset: gradual Severity: 8/10 at worst -- if moves getting out of car or in car, rolling in bed make it worse Quality: sharp, aching, and throbbing Location:  RLQ  Episode duration:  Radiation: no Frequency: intermittent Alleviating factors:  Aggravating factors: Status: worse Treatments attempted:  ice, heat, TENS, and opioids Fever: no Nausea: no Vomiting: no Weight loss: no Decreased appetite: no Diarrhea: no Constipation: no Blood in stool: no Heartburn: no Jaundice: no Rash: no Dysuria/urinary frequency: no Hematuria: no History of sexually transmitted disease: no Recurrent NSAID use: no   IFG: Recent A1c 6.5% in July, elevated from previous. Polydipsia/polyuria: no Visual disturbance: no Chest pain: no Paresthesias: no    CHRONIC KIDNEY DISEASE CKD status: stable Medications renally  dose: yes Previous renal evaluation: no Pneumovax:  Up to Date Influenza Vaccine:  Up to Date   HYPERTENSION / HYPERLIPIDEMIA/A-FIB Followed by cardiology with last visit 08/18/20.  Had ablation on 05/17/20.  Last echo in October 2021 noted EF 60-65%.   Saw neurology last 07/22/20 for generalized severe sensorimotor polyneuropathy -- continues Midodrine 10 MG TID for orthostatic hypotension and on Florinef 0.1 MG daily -- they are monitoring for autonomic neuropathy and did increase his Requip to 0.25 MG QID.     Continues on Rosuvastatin, Eliquis, fish oil. Satisfied with current treatment? yes Duration of hypertension: chronic BP monitoring frequency: a few times a week BP range: 100-110/60 BP medication side effects: no Duration of hyperlipidemia: chronic Cholesterol medication side effects: no Cholesterol supplements: fish oil Medication compliance: good compliance Aspirin: no Recent stressors: no Recurrent headaches: no Visual changes: no Palpitations: no Dyspnea: walking up driveway (has a hill) and when getting out of shower to dry off Chest pain: no Lower extremity edema: no Dizzy/lightheaded: none   DEPRESSION & CHRONIC PAIN Is on Effexor, Seroquel, and Ativan.  Pt and his wife at bedside made aware of risks of benzo medication use to include increased sedation, respiratory suppression, falls, extrapyramidal movements, dependence and cardiovascular events.  Pt and his wife would like to continue treatment as benefit determined to outweigh risk.  Have had at length discussions with him that he is also on opioid therapy, he takes Ativan at night when  he also takes his Norco and his Seroquel.  Discussed risks with taking these three medications together at same time and recommend to separate them.   He has tried taking 1/2 tablet Ativan, but this does not work well and wishes to maintain current dosing.  Last Ativan fill on PDMP review 10/18/20 and last opioid fill 10/23/20 with  recent UDS with pain management on 07/28/20. Saw Dr. Andree Elk on 10/12/20 last.   Duration:stable Anxious mood: yes  Excessive worrying: no Irritability: no  Sweating: no Nausea: no Palpitations:no Hyperventilation: no Panic attacks: no Agoraphobia: no  Obscessions/compulsions: no Depressed mood: no Depression screen [facility] 2/9 10/22/2020 08/11/2020 05/12/2020 04/30/2020 02/11/2020  Decreased Interest 1 0 0 0 1  Down, Depressed, Hopeless 1 1 0 0 1  PHQ - 2 Score 2 1 0 0 2  Altered sleeping 2 0 0 - 1  Tired, decreased energy 2 3 3  - 1  Change in appetite 0 1 0 - 0  Feeling bad or failure about yourself  0 0 0 - 0  Trouble concentrating 0 0 0 - 0  Moving slowly or fidgety/restless 0 0 0 - 0  Suicidal thoughts 0 0 0 - 0  PHQ-9 Score 6 5 3  - 4  Difficult doing work/chores - Somewhat difficult - - Not difficult at all  Some recent data might be hidden     Relevant past medical, surgical, family and social history reviewed and updated as indicated. Interim medical history since our last visit reviewed. Allergies and medications reviewed and updated.  Review of Systems  Constitutional:  Negative for activity change, appetite change, fatigue and fever.  Respiratory:  Negative for cough, chest tightness, shortness of breath and wheezing.   Cardiovascular:  Negative for chest pain, palpitations and leg swelling.  Endocrine: Negative for polydipsia, polyphagia and polyuria.  Musculoskeletal:  Positive for arthralgias.  Neurological: Negative.   Psychiatric/Behavioral:  Negative for decreased concentration, self-injury, sleep disturbance and suicidal ideas. The patient is not nervous/anxious.    Per HPI unless specifically indicated above     Objective:    BP 132/78   Pulse 81   Temp 98.5 F (36.9 C) (Oral)   Wt 282 lb 12.8 oz (128.3 kg)   SpO2 95%   BMI 39.44 kg/m   Wt Readings from Last 3 Encounters:  11/16/20 282 lb 12.8 oz (128.3 kg)  11/04/20 279 lb 3.2 oz (126.6 kg)  10/22/20 283  lb (128.4 kg)    Physical Exam Vitals and nursing note reviewed.  Constitutional:      General: He is awake. He is not in acute distress.    Appearance: He is well-developed and well-groomed. He is obese.  HENT:     Head: Normocephalic and atraumatic.     Right Ear: Hearing normal. No drainage.     Left Ear: Hearing normal. No drainage.  Eyes:     General: Lids are normal.        Right eye: No discharge.        Left eye: No discharge.     Conjunctiva/sclera: Conjunctivae normal.     Pupils: Pupils are equal, round, and reactive to light.  Neck:     Thyroid: No thyromegaly.     Vascular: No carotid bruit.  Cardiovascular:     Rate and Rhythm: Normal rate and regular rhythm.     Heart sounds: Normal heart sounds, S1 normal and S2 normal. No murmur heard.   No gallop.  Pulmonary:  Effort: Pulmonary effort is normal. No accessory muscle usage or respiratory distress.     Breath sounds: Normal breath sounds.  Abdominal:     General: Bowel sounds are normal. There is no distension.     Palpations: Abdomen is soft. There is no hepatomegaly.     Tenderness: There is abdominal tenderness in the right lower quadrant. There is no right CVA tenderness, left CVA tenderness, guarding or rebound. Positive signs include psoas sign. Negative signs include obturator sign.  Musculoskeletal:        General: Normal range of motion.     Cervical back: Normal range of motion and neck supple.     Right lower leg: No edema.     Left lower leg: No edema.  Skin:    General: Skin is warm and dry.  Neurological:     Mental Status: He is alert and oriented to person, place, and time.  Psychiatric:        Attention and Perception: Attention normal.        Mood and Affect: Mood normal.        Behavior: Behavior normal. Behavior is cooperative.        Thought Content: Thought content normal.    Results for orders placed or performed in visit on 10/05/20  Microscopic Examination   Urine  Result  Value Ref Range   WBC, UA 0-5 0 - 5 /hpf   RBC 0-2 0 - 2 /hpf   Epithelial Cells (non renal) 0-10 0 - 10 /hpf   Casts Present (A) None seen /lpf   Cast Type Hyaline casts N/A   Bacteria, UA None seen None seen/Few  Urinalysis, Complete  Result Value Ref Range   Specific Gravity, UA 1.020 1.005 - 1.030   pH, UA 6.0 5.0 - 7.5   Color, UA Yellow Yellow   Appearance Ur Hazy (A) Clear   Leukocytes,UA Negative Negative   Protein,UA Trace (A) Negative/Trace   Glucose, UA Negative Negative   Ketones, UA Negative Negative   RBC, UA Negative Negative   Bilirubin, UA Negative Negative   Urobilinogen, Ur 1.0 0.2 - 1.0 mg/dL   Nitrite, UA Negative Negative   Microscopic Examination See below:   PSA  Result Value Ref Range   Prostate Specific Ag, Serum 4.9 (H) 0.0 - 4.0 ng/mL      Assessment & Plan:   Problem List Items Addressed This Visit       Cardiovascular and Mediastinum   HTN (hypertension) (Chronic)    Chronic, ongoing.  Followed by cardiology.  BP at goal at home and in office.  With orthostatic BP presenting occasionally will continue current regimen at this time and have recommended utilizing compression hose at home, agrees to try this.  Continue collaboration with cardiology + neurology and current medication regimen.  Labs today.  Recommend ensuring good fluid intake at home.  Return in 3 months.      Relevant Orders   Comprehensive metabolic panel   PAF (paroxysmal atrial fibrillation) (HCC) (Chronic)    Chronic, stable, followed by cardiology.  Continue current medication regimen as prescribed by them.  Labs up to date and ablation last on 05/17/20.      Complete heart block ([place])    Followed by cardiology.  Pacemaker checks by them.         Digestive   GERD (gastroesophageal reflux disease) (Chronic)    Chronic, stable on Protonix.  Continue current medication regimen and check Mag level annually.  Endocrine   IFG (impaired fasting glucose) (Chronic)     Noted on past labs -- A1c today upward trend last visit to 6.5% and urine ALB 150.  Concern for T2DM and discussed with him at length options at this time, he wishes to focus on diet vs adding medication at this time.  Recheck A1c today and will initiate medication if ongoing trend up.      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      BPH (benign prostatic hyperplasia)    Chronic, stable with no current symptoms.  Check PSA today, has had some elevations but in normal range for age and ethnicity -- suspect more related to BPH.      Relevant Orders   PSA     Other   Dyslipidemia (Chronic)    Chronic, ongoing.  Continue current medication regimen and adjust as needed. Lipid panel up to date.      Anxiety (Chronic)    Chronic, ongoing.  Discussed at length risk of benzo and opioid use in conjunction with each other.  Recommend he not take the two together at same hour during day or evening.  He tried to cut back to 1/2 tablet but was unsuccessful in past.   Continue to collaborate with CCM team on education.  Provided wife and him with copy of [facility] ben[facility] risk information patient sheet in past.  They request 90 day supply, as previous PCP supplied this.  Are aware he does have to return every 3 months for refills.  UDS up to date with pain management.  Refills sent.        Chronic pain syndrome (Chronic)    Chronic, ongoing followed by pain clinic.  Discussed at length risk of benzo and opioid use in conjunction with each other.  Recommend he not take the two together at same hour during day or evening.        Morbid obesity ([place]) - Primary (Chronic)    BMI 39.44 with HTN, A-Fib, Heart Block, and CKD.  Recommended eating smaller high protein, low fat meals more frequently and exercising 30 mins a day 5 times a week with a goal of  10-15lb weight loss in the next 3 months. Patient voiced their understanding and motivation to adhere to these recommendations.       Pacemaker - MDT (Chronic)    Regular checks with cardiology.      Restless leg syndrome (Chronic)    Followed by neurology, continue current medication regimen as prescribed by them.      Right lower quadrant abdominal pain    Referral to GI for further evaluation.  Labs today.      Relevant Orders   Ambulatory referral to Gastroenterology   Urinalysis, Routine w reflex microscopic   CBC with Differential/Platelet   Magnesium     Follow up plan: Return in about 4 weeks (around 12/14/2020) for RLQ PAIN.

## 2020-11-16 NOTE — Assessment & Plan Note (Signed)
Regular checks with cardiology. 

## 2020-11-16 NOTE — Assessment & Plan Note (Signed)
Chronic, ongoing.  Followed by cardiology.  BP at goal at home and in office.  With orthostatic BP presenting occasionally will continue current regimen at this time and have recommended utilizing compression hose at home, agrees to try this.  Continue collaboration with cardiology + neurology and current medication regimen.  Labs today.  Recommend ensuring good fluid intake at home.  Return in 3 months.

## 2020-11-16 NOTE — Assessment & Plan Note (Signed)
Chronic, stable on Protonix.  Continue current medication regimen and check Mag level annually. 

## 2020-11-16 NOTE — Assessment & Plan Note (Signed)
Ongoing stage 3a, recheck CMP today, current up to date, and adjust medications as needed.

## 2020-11-16 NOTE — Patient Instructions (Signed)
Abdominal Pain, Adult Many things can cause belly (abdominal) pain. Most times, belly pain is not dangerous. Many cases of belly pain can be watched and treated at home. Sometimes, though, belly pain is serious. Yourdoctor will try to find the cause of your belly pain. Follow these instructions at home:  Medicines Take over-the-counter and prescription medicines only as told by your doctor. Do not take medicines that help you poop (laxatives) unless told by your doctor. General instructions Watch your belly pain for any changes. Drink enough fluid to keep your pee (urine) pale yellow. Keep all follow-up visits as told by your doctor. This is important. Contact a doctor if: Your belly pain changes or gets worse. You are not hungry, or you lose weight without trying. You are having trouble pooping (constipated) or have watery poop (diarrhea) for more than 2-3 days. You have pain when you pee or poop. Your belly pain wakes you up at night. Your pain gets worse with meals, after eating, or with certain foods. You are vomiting and cannot keep anything down. You have a fever. You have blood in your pee. Get help right away if: Your pain does not go away as soon as your doctor says it should. You cannot stop vomiting. Your pain is only in areas of your belly, such as the right side or the left lower part of the belly. You have bloody or black poop, or poop that looks like tar. You have very bad pain, cramping, or bloating in your belly. You have signs of not having enough fluid or water in your body (dehydration), such as: Dark pee, very little pee, or no pee. Cracked lips. Dry mouth. Sunken eyes. Sleepiness. Weakness. You have trouble breathing or chest pain. Summary Many cases of belly pain can be watched and treated at home. Watch your belly pain for any changes. Take over-the-counter and prescription medicines only as told by your doctor. Contact a doctor if your belly pain  changes or gets worse. Get help right away if you have very bad pain, cramping, or bloating in your belly. This information is not intended to replace advice given to you by your health care provider. Make sure you discuss any questions you have with your healthcare provider. Document Revised: 05/20/2018 Document Reviewed: 05/20/2018 Elsevier Patient Education  2022 Elsevier Inc.  

## 2020-11-16 NOTE — Assessment & Plan Note (Signed)
BMI 39.44 with HTN, A-Fib, Heart Block, and CKD.  Recommended eating smaller high protein, low fat meals more frequently and exercising 30 mins a day 5 times a week with a goal of 10-15lb weight loss in the next 3 months. Patient voiced their understanding and motivation to adhere to these recommendations.

## 2020-11-16 NOTE — Assessment & Plan Note (Signed)
Followed by neurology, continue current medication regimen as prescribed by them. 

## 2020-11-16 NOTE — Assessment & Plan Note (Signed)
Referral to GI for further evaluation.  Labs today.

## 2020-11-16 NOTE — Assessment & Plan Note (Signed)
Chronic, stable with no current symptoms.  Check PSA today, has had some elevations but in normal range for age and ethnicity -- suspect more related to BPH.

## 2020-11-17 LAB — COMPREHENSIVE METABOLIC PANEL
ALT: 20 IU/L (ref 0–44)
AST: 31 IU/L (ref 0–40)
Albumin/Globulin Ratio: 2.6 — ABNORMAL HIGH (ref 1.2–2.2)
Albumin: 4.9 g/dL — ABNORMAL HIGH (ref 3.7–4.7)
Alkaline Phosphatase: 60 IU/L (ref 44–121)
BUN/Creatinine Ratio: 8 — ABNORMAL LOW (ref 10–24)
BUN: 8 mg/dL (ref 8–27)
Bilirubin Total: 0.5 mg/dL (ref 0.0–1.2)
CO2: 25 mmol/L (ref 20–29)
Calcium: 9.8 mg/dL (ref 8.6–10.2)
Chloride: 99 mmol/L (ref 96–106)
Creatinine, Ser: 1.03 mg/dL (ref 0.76–1.27)
Globulin, Total: 1.9 g/dL (ref 1.5–4.5)
Glucose: 125 mg/dL — ABNORMAL HIGH (ref 70–99)
Potassium: 4.2 mmol/L (ref 3.5–5.2)
Sodium: 140 mmol/L (ref 134–144)
Total Protein: 6.8 g/dL (ref 6.0–8.5)
eGFR: 76 mL/min/{1.73_m2} (ref >59)

## 2020-11-17 LAB — CBC WITH DIFFERENTIAL/PLATELET
Basophils Absolute: 0.1 10*3/uL (ref 0.0–0.2)
Basos: 1 %
EOS (ABSOLUTE): 0.3 10*3/uL (ref 0.0–0.4)
Eos: 5 %
Hematocrit: 46.4 % (ref 37.5–51.0)
Hemoglobin: 15.3 g/dL (ref 13.0–17.7)
Immature Grans (Abs): 0 10*3/uL (ref 0.0–0.1)
Immature Granulocytes: 0 %
Lymphocytes Absolute: 1.3 10*3/uL (ref 0.7–3.1)
Lymphs: 23 %
MCH: 30.8 pg (ref 26.6–33.0)
MCHC: 33 g/dL (ref 31.5–35.7)
MCV: 94 fL (ref 79–97)
Monocytes Absolute: 0.5 10*3/uL (ref 0.1–0.9)
Monocytes: 9 %
Neutrophils Absolute: 3.5 10*3/uL (ref 1.4–7.0)
Neutrophils: 62 %
Platelets: 170 10*3/uL (ref 150–450)
RBC: 4.96 x10E6/uL (ref 4.14–5.80)
RDW: 12.8 % (ref 11.6–15.4)
WBC: 5.7 10*3/uL (ref 3.4–10.8)

## 2020-11-17 LAB — PSA: Prostate Specific Ag, Serum: 5.4 ng/mL — ABNORMAL HIGH (ref 0.0–4.0)

## 2020-11-17 LAB — MAGNESIUM: Magnesium: 2.2 mg/dL (ref 1.6–2.3)

## 2020-11-17 NOTE — Progress Notes (Signed)
Contacted via MyChart   Good evening Nicholas [name], your labs have returned.  Overall they remain at baseline, but PSA (prostate lab) remains elevated -- slightly more then normal for your age and ethnicity.  This is most likely related to enlargement of prostate with aging, recent CT did not show any abnormal findings with prostate.  Do you have lots of night visits to the bathroom, any dribbling, weaker urinary stream?  If so it would be good for you to return to urology for a visit to discuss and further evaluate this elevation.  I would recommend calling them to schedule.  Any questions? Keep being amazing!!  Thank you for allowing me to participate in your care.  I appreciate you. Kindest regards, Sadat Sliwa

## 2020-11-22 ENCOUNTER — Other Ambulatory Visit: Payer: Self-pay

## 2020-11-22 ENCOUNTER — Telehealth: Payer: Self-pay | Admitting: *Deleted

## 2020-11-22 ENCOUNTER — Encounter: Payer: Self-pay | Admitting: Gastroenterology

## 2020-11-22 ENCOUNTER — Ambulatory Visit: Payer: Medicare Other | Admitting: Gastroenterology

## 2020-11-22 VITALS — BP 149/89 | HR 90 | Temp 99.2°F | Wt 281.8 lb

## 2020-11-22 DIAGNOSIS — R198 Other specified symptoms and signs involving the digestive system and abdomen: Secondary | ICD-10-CM

## 2020-11-22 DIAGNOSIS — I480 Paroxysmal atrial fibrillation: Secondary | ICD-10-CM | POA: Diagnosis not present

## 2020-11-22 DIAGNOSIS — E785 Hyperlipidemia, unspecified: Secondary | ICD-10-CM

## 2020-11-22 DIAGNOSIS — I10 Essential (primary) hypertension: Secondary | ICD-10-CM | POA: Diagnosis not present

## 2020-11-22 DIAGNOSIS — R1031 Right lower quadrant pain: Secondary | ICD-10-CM

## 2020-11-22 DIAGNOSIS — G8929 Other chronic pain: Secondary | ICD-10-CM | POA: Diagnosis not present

## 2020-11-22 NOTE — Addendum Note (Signed)
Addended by: Lurlean Nanny on: 11/22/2020 04:13 PM   Modules accepted: Orders, SmartSet

## 2020-11-22 NOTE — Progress Notes (Signed)
[name], MD [address]  [place]  [place], [place] [address]  Main: [phone]  Fax: [phone]    Gastroenterology Consultation  Referring Provider:     Venita Lick, NP Primary Care Physician:  Venita Lick, NP Primary Gastroenterologist:  Dr. [name] Reason for Consultation:     Chronic right lower quadrant pain        HPI:   Nicholas [name] is a 75 y.o. male referred by Dr. Venita Lick, NP  for consultation & management of chronic right lower quadrant pain  Patient reports that he has been suffering from moderate to severe right lower quadrant pain, that started in April 2022.  He was evaluated with CT abdomen and pelvis without contrast in 04/2020, it was unremarkable.  He felt better and then the pain recurred with increasing severity.  The pain is localized to right lower quadrant without any radiation, associated with abdominal bloating.  Patient also reports irregular bowel habits associated with straining.  Patient was evaluated by general surgeon who did not feel the need for any surgical intervention.  Patient underwent repeat CT abdomen and pelvis with contrast in August 2022, no evidence of appendicitis.  No other acute intra-abdominal pathology identified.  He was also evaluated by urology, no etiology was found.  He was evaluated by his orthopedic surgeon, no etiology was identified.  Therefore, patient is referred to GI for further management.  Patient denies nausea, vomiting, fever, chills.  Patient is accompanied by his wife today.  Patient does acknowledge consumption of red meat few times a week.  He denies carbonated beverages.  He does not drink adequate amount of water daily.  NSAIDs: None  Antiplts/Anticoagulants/Anti thrombotics: Eliquis for history of A. fib  GI Procedures:  EGD by Dr Gustavo Lah 05/17/2017 - Z-line variable. - Erosive gastritis. Biopsied. - Abnormal esophageal motility, consistent with presbyesophagus. -  Small hiatal hernia. - Dilation performed in the lower third of the esophagus. - Biopsies were taken with a cold forceps for histology at the gastroesophageal junction.  DIAGNOSIS:  A. STOMACH, ANTRUM AND BODY; COLD BIOPSY:  - ANTRAL AND OXYNTIC MUCOSA WITH MINIMAL CHRONIC GASTRITIS.  - NEGATIVE FOR H. PYLORI, DYSPLASIA AND MALIGNANCY.   B.  GE JUNCTION; COLD BIOPSY:  - REFLUX GASTROESOPHAGITIS.  - NEGATIVE FOR GOBLET CELLS, DYSPLASIA AND MALIGNANCY.    EGD and colonoscopy 12/07/2015  - LA Grade A erosive esophagitis. Biopsied. - No endoscopic esophageal abnormality to explain patient's dysphagia. Esophagus dilated. Dilated. - Bile gastritis. Biopsied. - Mucosal variant in the duodenum. Biopsied.  - Preparation of the colon was fair. - Non-bleeding internal hemorrhoids. - Redundant colon. - Biopsies were taken with a cold forceps from the right colon and left colon for evaluation of microscopic colitis.  DIAGNOSIS:  A. DUODENUM; COLD BIOPSY:  - DUODENAL MUCOSA WITH INTACT VILLI.  - NEGATIVE FOR ACTIVE INFLAMMATION, INTRAEPITHELIAL LYMPHOCYTOSIS, AND  INFECTIOUS AGENTS.   B. STOMACH, ANTRUM; COLD BIOPSY:  - MINIMAL REACTIVE GASTROPATHY.  - NEGATIVE FOR ACTIVE INFLAMMATION, INTESTINAL METAPLASIA, DYSPLASIA,  AND MALIGNANCY.  - NEGATIVE FOR HELICOBACTER PYLORI IN HEMATOXYLIN AND EOSIN SECTIONS.   C. STOMACH, BODY; COLD BIOPSY:  - OXYNTIC MUCOSA WITHOUT PATHOLOGIC CHANGES.  - NEGATIVE FOR ATROPHY, INTESTINAL METAPLASIA, DYSPLASIA, AND  MALIGNANCY.  - NEGATIVE FOR HELICOBACTER PYLORI IN HEMATOXYLIN AND EOSIN SECTIONS.   D. GASTROESOPHAGEAL JUNCTION; COLD BIOPSY:  - COLUMNAR LINED MUCOSA WITH MODERATE CHRONIC ACTIVE INFLAMMATION.  - NEGATIVE FOR GOBLET CELLS,  DYSPLASIA, AND MALIGNANCY.   E. RIGHT COLON; COLD BIOPSY:  - NEGATIVE FOR COLITIS.   F. LEFT COLON; COLD BIOPSY:  - NEGATIVE FOR COLITIS.   Past Medical History:  Diagnosis Date   Anterior urethral stricture     Anxiety    Arthritis    a. knees, hips, hands;  b. 11/2013 s/p L TKA @ [facility].   Bile reflux gastritis    Bulging lumbar disc    BXO (balanitis xerotica obliterans)    Complete heart block ([place])    a. s/p MDT dual chamber (His bundle) pacemaker 01/2016 Dr Caryl Comes   Depression    DVT (deep venous thrombosis) ([place])    Erosive esophagitis    Gross hematuria    Hyperlipemia    Hypertension    borderline   Internal hemorrhoids    Phimosis    Pulmonary embolism ([place])     Past Surgical History:  Procedure Laterality Date   ATRIAL FIBRILLATION ABLATION N/A 05/17/2020   Procedure: ATRIAL FIBRILLATION ABLATION;  Surgeon: Vickie Epley, MD;  Location: [facility];  Service: Cardiovascular;  Laterality: N/A;   BUNIONECTOMY Bilateral 01/06/2015   Procedure: Lillard Anes;  Surgeon: [name], MD;  Location: [facility] ORS;  Service: Orthopedics;  Laterality: Bilateral;   CARDIAC CATHETERIZATION  ~ 2005   "once"   CATARACT EXTRACTION W/ INTRAOCULAR LENS  IMPLANT, BILATERAL Bilateral ~ 2010   COLONOSCOPY WITH PROPOFOL N/A 12/07/2015   Procedure: COLONOSCOPY WITH PROPOFOL;  Surgeon: Lollie Sails, MD;  Location: [facility] ENDOSCOPY;  Service: Endoscopy;  Laterality: N/A;   EP IMPLANTABLE DEVICE N/A 02/16/2016   MDT dual chamber (His Bundle) pacemaker implanted by Dr Caryl Comes for intermittent complete heart block   ESOPHAGOGASTRODUODENOSCOPY (EGD) WITH PROPOFOL N/A 12/07/2015   Procedure: ESOPHAGOGASTRODUODENOSCOPY (EGD) WITH PROPOFOL;  Surgeon: Lollie Sails, MD;  Location[facility] ENDOS[facility];  Service: Endoscopy;  Laterality: N/A;   ESOPHAGOGASTRODUODENOSCOPY (EGD) WITH PROPOFOL N/A 05/17/2017   Procedure: ESOPHAGOGASTRODUODENOSCOPY (EGD) WITH PROPOFOL;  Surgeon: Lollie Sails, MD;  Location: [facility] ENDOSCOPY;  Service: Endoscopy;  Laterality: N/A;   HAMMER TOE SURGERY Bilateral 01/06/2015   Procedure: HAMMER TOE CORRECTION;  Surgeon: [name], MD;  Location: [facility] ORS;  Service:  Orthopedics;  Laterality: Bilateral;   KNEE CARTILAGE SURGERY Left 1965   "football injury"   Left Total Knee Arthroplasty     a. 11/2013 [facility].   PACEMAKER IMPLANT  2017   PILONIDAL CYST EXCISION  1970's   TOTAL HIP ARTHROPLASTY Right 2004   TOTAL HIP ARTHROPLASTY Left 2006   UPPER GI ENDOSCOPY      Current Outpatient Medications:    acetaminophen (TYLENOL) 650 MG CR tablet, Take 650 mg by mouth every 8 (eight) hours as needed for pain., Disp: , Rfl:    apixaban (ELIQUIS) 5 MG TABS tablet, Take 1 tablet (5 mg total) by mouth 2 (two) times daily., Disp: 180 tablet, Rfl: 1   Ascorbic Acid (VITAMIN C) 1000 MG tablet, Take 1,000 mg by mouth daily., Disp: , Rfl:    Cranberry 500 MG CAPS, Take 500 mg by mouth daily., Disp: , Rfl:    gabapentin (NEURONTIN) 600 MG tablet, Take 1 tablet (600 mg total) by mouth at bedtime., Disp: 90 tablet, Rfl: 4   HYDROcodone-acetaminophen (NORCO/VICODIN) 5-325 MG tablet, Take 1 tablet by mouth 2 (two) times daily., Disp: 60 tablet, Rfl: 0   hyoscyamine (LEVSIN, ANASPAZ) 0.125 MG tablet, Take 0.125 mg by mouth every 6 (six) hours as needed for bladder spasms or cramping. ,  Disp: , Rfl:    LORazepam (ATIVAN) 1 MG tablet, Take 1 tablet (1 mg total) by mouth at bedtime., Disp: 90 tablet, Rfl: 0   Magnesium 400 MG TABS, Take 400 mg by mouth daily. , Disp: , Rfl:    methocarbamol (ROBAXIN) 500 MG tablet, Take 1 tablet (500 mg total) by mouth 2 (two) times daily as needed for muscle spasms., Disp: 60 tablet, Rfl: 2   midodrine (PROAMATINE) 10 MG tablet, TAKE 1 TABLET BY MOUTH THREE TIMES A DAY, Disp: 270 tablet, Rfl: 3   Multiple Vitamin (MULTIVITAMIN WITH MINERALS) TABS tablet, Take 1 tablet by mouth daily. Centrum Silver, Disp: , Rfl:    neomycin-polymyxin b-dexamethasone (MAXITROL) 3.5-10000-0.1 OINT, 2 (two) times daily., Disp: , Rfl:    nystatin (MYCOSTATIN/NYSTOP) powder, Apply 1 application topically 3 (three) times daily., Disp: 30 g, Rfl: 1   Omega-3 Fatty  Acids (FISH OIL) 1200 MG CAPS, Take 1,200 mg by mouth daily., Disp: , Rfl:    oxymetazoline (AFRIN) 0.05 % nasal spray, Place 2 sprays into both nostrils at bedtime., Disp: , Rfl:    pantoprazole (PROTONIX) 20 MG tablet, TAKE 2 TABLETS (40 MG TOTAL) BY MOUTH DAILY. (Patient taking differently: Take 20 mg by mouth daily.), Disp: 90 tablet, Rfl: 4   potassium chloride SA (KLOR-CON) 20 MEQ tablet, Take 2 tablets (40 meq) x 1 dose as directed (Patient taking differently: Take 40 mEq by mouth daily as needed (with torsemide (fluid retention)).), Disp: 10 tablet, Rfl: 0   QUEtiapine Fumarate (SEROQUEL XR) 150 MG 24 hr tablet, TAKE 1 TABLET (150 MG TOTAL) BY MOUTH AT BEDTIME., Disp: 90 tablet, Rfl: 4   rOPINIRole (REQUIP) 0.25 MG tablet, Take 0.25 mg by mouth 4 (four) times daily., Disp: , Rfl:    rosuvastatin (CRESTOR) 40 MG tablet, Take 1 tablet (40 mg total) by mouth daily., Disp: 90 tablet, Rfl: 4   solifenacin (VESICARE) 10 MG tablet, TAKE 1 TABLET BY MOUTH EVERY DAY, Disp: 90 tablet, Rfl: 1   torsemide (DEMADEX) 20 MG tablet, Take 40 mg by mouth daily as needed (fluid retention)., Disp: , Rfl:    venlafaxine XR (EFFEXOR XR) 75 MG 24 hr capsule, Take 3 capsules (225 mg total) by mouth daily., Disp: 270 capsule, Rfl: 4    Family History  Problem Relation Age of Onset   Alcoholism Mother        died in her 37's.   Cancer Mother    Stroke Father        deceased   Diabetes Father    Hypertension Father    Glaucoma Sister    Breast cancer Sister    Diabetes Daughter    Asthma Son    Stroke Paternal Grandmother    Prostate cancer Paternal Grandfather      Social History   Tobacco Use   Smoking status: Never   Smokeless tobacco: Never  Vaping Use   Vaping Use: Never used  Substance Use Topics   Alcohol use: Yes    Alcohol/week: 1.0 standard drink    Types: 1 Cans of beer per week    Comment: beers seldom    Drug use: No    Allergies as of 11/22/2020   (No Known Allergies)     Review of Systems:    All systems reviewed and negative except where noted in HPI.   Physical Exam:  BP (!) 149/89   Pulse 90   Temp 99.2 F (37.3 C) (Oral)   Wt 281 lb  12.8 oz (127.8 kg)   BMI 39.30 kg/m  No LMP for male patient.  General:   Alert,  Well-developed, well-nourished, pleasant and cooperative in NAD Head:  Normocephalic and atraumatic. Eyes:  Sclera clear, no icterus.   Conjunctiva pink. Ears:  Normal auditory acuity. Nose:  No deformity, discharge, or lesions. Mouth:  No deformity or lesions,oropharynx pink & moist. Neck:  Supple; no masses or thyromegaly. Lungs:  Respirations even and unlabored.  Clear throughout to auscultation.   No wheezes, crackles, or rhonchi. No acute distress. Heart:  Regular rate and rhythm; no murmurs, clicks, rubs, or gallops. Abdomen:  Normal bowel sounds. Soft, moderate right lower quadrant tenderness, diffuse abdominal distention, tympanic to percussion without masses, hepatosplenomegaly or hernias noted.  No guarding or rebound tenderness.   Rectal: Not performed Msk:  Symmetrical without gross deformities. Good, equal movement & strength bilaterally. Pulses:  Normal pulses noted. Extremities:  No clubbing or edema.  No cyanosis. Neurologic:  Alert and oriented x3;  grossly normal neurologically. Skin:  Intact without significant lesions or rashes. No jaundice. Psych:  Alert and cooperative. Normal mood and affect.  Imaging Studies: Reviewed  Assessment and Plan:   Nicholas [name] is a 75 y.o. male with obesity, history of A. fib, coronary artery disease, s/p pacemaker, on Eliquis is seen in consultation for chronic right lower quadrant pain associated with abdominal distention, irregular bowel habits  Discussed with patient to try MiraLAX and clear liquids today for cleanout for possible underlying severe constipation attributing to right lower quadrant pain.  Other possibility is a terminal ileitis or TI stricture.  Therefore,  recommend colonoscopy with TI evaluation.  Patient will be off Eliquis for 2 days prior to colonoscopy Discussed with patient regarding elimination of red meat and adequate intake of water  I have discussed alternative options, risks & benefits,  which include, but are not limited to, bleeding, infection, perforation,respiratory complication & drug reaction.  The patient agrees with this plan & written consent will be obtained.     Follow up based on the colonoscopy results  [name], MD

## 2020-11-22 NOTE — Patient Instructions (Signed)
Start today: Mix 119g of Miralax with 16 oz of Gatorade mix well and drink. Liquid diet today until you have a bowel movement  Stop Eliquis until further instructions are given from Cardiology.

## 2020-11-22 NOTE — Telephone Encounter (Signed)
   Pre-operative Risk Assessment    Patient Name: Nicholas [name]  DOB: [birth year] MRN: [id number]      Request for Surgical Clearance   Procedure:   COLONOSCOPY  Date of Surgery: Clearance 11/25/20                                 Surgeon:  DR. Lin Landsman Surgeon's Group or Practice Name:  [facility] Phone number:  [phone] Fax number:  [phone] ATTN: MELANIE   Type of Clearance Requested: - Medical  - Pharmacy:  Hold Apixaban (Eliquis)     Type of Anesthesia:   PROPOFOL   Additional requests/questions:   Jiles Prows   11/22/2020, 4:51 PM

## 2020-11-23 ENCOUNTER — Telehealth: Payer: Self-pay

## 2020-11-23 ENCOUNTER — Other Ambulatory Visit: Payer: Self-pay | Admitting: Nurse Practitioner

## 2020-11-23 NOTE — Telephone Encounter (Signed)
Patient with diagnosis of atrial fibrillation on Eliquis for anticoagulation.    Procedure: colonoscopy Date of procedure: 11/25/20   CHA2DS2-VASc Score = 4   This indicates a 4.8% annual risk of stroke. The patient's score is based upon: CHF History: 0 HTN History: 1 Diabetes History: 0 Stroke History: 0 Vascular Disease History: 1 Age Score: 2 Gender Score: 0    CrCl 84 (with adjusted body weight) Platelet count 170  Per office protocol, patient can hold Eliquis for 2 days prior to procedure.   Patient will not need bridging with Lovenox (enoxaparin) around procedure.

## 2020-11-23 NOTE — Telephone Encounter (Signed)
Requested medication (s) are due for refill today - discontinued and expired Rx  Requested medication (s) are on the active medication list -no/yes  Future visit scheduled -yes  Last refill: Tamsulosin- discontinued and no longer current on medication list- 10/05/20                 Gabapentin- 11/09/20 #90 4RF- expired Rx  Notes to clinic: Request RF- discontinued medication and expired medication - sent for review   Requested Prescriptions  Pending Prescriptions Disp Refills   tamsulosin (FLOMAX) 0.4 MG CAPS capsule [Pharmacy Med Name: TAMSULOSIN HCL 0.4 MG CAPSULE] 90 capsule 1    Sig: TAKE 1 CAPSULE BY MOUTH EVERY DAY AFTER SUPPER     Urology: Alpha-Adrenergic Blocker Failed - 11/23/2020  9:36 AM      Failed - Last BP in normal range    BP Readings from Last 1 Encounters:  11/22/20 (!) 149/89          Passed - Valid encounter within last 12 months    Recent Outpatient Visits           1 week ago Morbid obesity ([place])   [place], [place] T, NP   1 month ago Need for influenza vaccination   [place], Lauren A, NP   2 months ago Right lower quadrant abdominal pain   [place], [place] T, NP   3 months ago Depression, recurrent ([place])   [place], [place] T, NP   3 months ago Cough   [place], Barbaraann Faster, NP       Future Appointments             In 1 week [place], Eber Hong, MD [place], [facility]   In 4 weeks [place], [place] T, NP [facility], [facility]   In 5 months  [place], [facility]             gabapentin (NEURONTIN) 600 MG tablet [Pharmacy Med Name: GABAPENTIN 600 MG TABLET] 90 tablet 4    Sig: TAKE 1 TABLET BY MOUTH AT BEDTIME.     Neurology: Anticonvulsants - gabapentin Passed - 11/23/2020  9:36 AM      Passed - Valid encounter within last 12 months    Recent Outpatient Visits           1 week ago  Morbid obesity ([place])   [place], [name], NP   1 month ago Need for influenza vaccination   [place], Lauren A, NP   2 months ago Right lower quadrant abdominal pain   [place], [place] T, NP   3 months ago Depression, recurrent ([place])   [place], [name], NP   3 months ago Cough   [place], Barbaraann Faster, NP       Future Appointments             In 1 week Turner, Eber Hong, MD [place], [facility]   In 4 weeks [place], Barbaraann Faster, NP [facility], [facility]   In 5 months  [facility], [facility]               Requested Prescriptions  Pending Prescriptions Disp Refills   tamsulosin (FLOMAX) 0.4 MG CAPS capsule [Pharmacy Med Name: TAMSULOSIN HCL 0.4 MG CAPSULE] 90 capsule 1    Sig: TAKE 1 CAPSULE  BY MOUTH EVERY DAY AFTER SUPPER     Urology: Alpha-Adrenergic Blocker Failed - 11/23/2020  9:36 AM      Failed - Last BP in normal range    BP Readings from Last 1 Encounters:  11/22/20 (!) 149/89          Passed - Valid encounter within last 12 months    Recent Outpatient Visits           1 week ago Morbid obesity ([place])   [place], [place] T, NP   1 month ago Need for influenza vaccination   [place], Lauren A, NP   2 months ago Right lower quadrant abdominal pain   [place], [place] T, NP   3 months ago Depression, recurrent ([place])   [place], [name], NP   3 months ago Cough   [place], Barbaraann Faster, NP       Future Appointments             In 1 week [place], Eber Hong, MD [place], [facility]   In 4 weeks [place], [place] T, NP [facility], [facility]   In 5 months  [place], [facility]             gabapentin (NEURONTIN) 600 MG tablet [Pharmacy Med Name:  GABAPENTIN 600 MG TABLET] 90 tablet 4    Sig: TAKE 1 TABLET BY MOUTH AT BEDTIME.     Neurology: Anticonvulsants - gabapentin Passed - 11/23/2020  9:36 AM      Passed - Valid encounter within last 12 months    Recent Outpatient Visits           1 week ago Morbid obesity ([place])   [place], [name], NP   1 month ago Need for influenza vaccination   [place], Lauren A, NP   2 months ago Right lower quadrant abdominal pain   [place], [place] T, NP   3 months ago Depression, recurrent ([place])   [place], [name], NP   3 months ago Cough   [place], Barbaraann Faster, NP       Future Appointments             In 1 week Turner, Eber Hong, MD [place], [facility]   In 4 weeks [place], Barbaraann Faster, NP [facility], [place]   In 5 months  [facility], [place]

## 2020-11-23 NOTE — Telephone Encounter (Signed)
Pt is aware as instructed and is aware to stop Eliquis today until procedure

## 2020-11-23 NOTE — Telephone Encounter (Signed)
Note     Name: Nicholas [name]  DOB: 1945-03-27  MRN: [id number]    Primary Cardiologist: Virl Axe, MD   Chart reviewed as part of pre-operative protocol coverage. Patient was contacted 11/23/2020 in reference to pre-operative risk assessment for pending surgery as outlined below.  Nicholas [name] was last seen on 10/05/20 by Dr. Caryl Comes.  Since that day, Nicholas [name] has done well. He is primarily limited by abdominal pain but still reports activity equating to 4.0 METS (stairs, groceries).    Per our clinical pharmacist: Patient with diagnosis of atrial fibrillation on Eliquis for anticoagulation.     Procedure: colonoscopy Date of procedure: 11/25/20     CHA2DS2-VASc Score = 4   This indicates a 4.8% annual risk of stroke. The patient's score is based upon: CHF History: 0 HTN History: 1 Diabetes History: 0 Stroke History: 0 Vascular Disease History: 1 Age Score: 2 Gender Score: 0     CrCl 84 (with adjusted body weight) Platelet count 170   Per office protocol, patient can hold Eliquis for 2 days prior to procedure.   Patient will not need bridging with Lovenox (enoxaparin) around procedure.   Therefore, based on ACC/AHA guidelines, the patient would be at acceptable risk for the planned procedure without further cardiovascular testing.    The patient was advised that if he develops new symptoms prior to surgery to contact our office to arrange for a follow-up visit, and he verbalized understanding.   I will route this recommendation to the requesting party via Epic fax function and remove from pre-op pool. Please call with questions.   Tami Lin [place], PA 11/23/2020, 8:27 AM

## 2020-11-23 NOTE — Telephone Encounter (Signed)
Please advise if appointment is needed for medication follow up/refill.

## 2020-11-23 NOTE — Telephone Encounter (Signed)
   Name: Nicholas [name]  DOB: 1945-04-29  MRN: [id number]   Primary Cardiologist: Virl Axe, MD  Chart reviewed as part of pre-operative protocol coverage. Patient was contacted 11/23/2020 in reference to pre-operative risk assessment for pending surgery as outlined below.  Nicholas [name] was last seen on 10/05/20 by Dr. Caryl Comes.  Since that day, Nicholas [name] has done well. He is primarily limited by abdominal pain but still reports activity equating to 4.0 METS (stairs, groceries).   Per our clinical pharmacist: Patient with diagnosis of atrial fibrillation on Eliquis for anticoagulation.     Procedure: colonoscopy Date of procedure: 11/25/20     CHA2DS2-VASc Score = 4   This indicates a 4.8% annual risk of stroke. The patient's score is based upon: CHF History: 0 HTN History: 1 Diabetes History: 0 Stroke History: 0 Vascular Disease History: 1 Age Score: 2 Gender Score: 0     CrCl 84 (with adjusted body weight) Platelet count 170   Per office protocol, patient can hold Eliquis for 2 days prior to procedure.   Patient will not need bridging with Lovenox (enoxaparin) around procedure.  Therefore, based on ACC/AHA guidelines, the patient would be at acceptable risk for the planned procedure without further cardiovascular testing.   The patient was advised that if he develops new symptoms prior to surgery to contact our office to arrange for a follow-up visit, and he verbalized understanding.  I will route this recommendation to the requesting party via Epic fax function and remove from pre-op pool. Please call with questions.  Tami Lin Aleeah Greeno, PA 11/23/2020, 8:27 AM

## 2020-11-25 ENCOUNTER — Encounter: Payer: Self-pay | Admitting: Gastroenterology

## 2020-11-25 ENCOUNTER — Encounter
Admission: RE | Disposition: A | Discharge status: Home / Self Care | Payer: Self-pay | Source: Ambulatory Visit | Attending: Gastroenterology

## 2020-11-25 ENCOUNTER — Other Ambulatory Visit: Payer: Self-pay

## 2020-11-25 ENCOUNTER — Ambulatory Visit
Admission: RE | Admit: 2020-11-25 | Discharge: 2020-11-26 | Disposition: A | Discharge status: Home / Self Care | Payer: Medicare Other | Source: Ambulatory Visit | Attending: Gastroenterology | Admitting: Gastroenterology

## 2020-11-25 ENCOUNTER — Other Ambulatory Visit: Payer: Self-pay | Admitting: Gastroenterology

## 2020-11-25 DIAGNOSIS — K644 Residual hemorrhoidal skin tags: Secondary | ICD-10-CM | POA: Diagnosis not present

## 2020-11-25 DIAGNOSIS — R1031 Right lower quadrant pain: Secondary | ICD-10-CM

## 2020-11-25 DIAGNOSIS — G8929 Other chronic pain: Secondary | ICD-10-CM

## 2020-11-25 SURGERY — COLONOSCOPY WITH PROPOFOL
Anesthesia: General

## 2020-11-25 MED ORDER — SODIUM CHLORIDE 0.9 % IV SOLN: INTRAVENOUS | Status: DC

## 2020-11-25 MED ORDER — CLENPIQ 10-3.5-12 MG-GM -GM/160ML PO SOLN: 320.0000 mL | Freq: Once | ORAL | 0 refills | Status: AC

## 2020-11-25 NOTE — Anesthesia Preprocedure Evaluation (Signed)
Anesthesia Evaluation  Patient identified by MRN, date of birth, ID band Patient awake    Reviewed: Allergy & Precautions, NPO status , Patient's Chart, lab work & pertinent test results  Airway Mallampati: III  TM Distance: >3 FB Neck ROM: full    Dental  (+) Teeth Intact   Pulmonary neg pulmonary ROS, sleep apnea ,    Pulmonary exam normal  + decreased breath sounds      Cardiovascular hypertension, negative cardio ROS Normal cardiovascular exam+ dysrhythmias + pacemaker  Rhythm:Regular Rate:Normal     Neuro/Psych Anxiety Depression negative neurological ROS  negative psych ROS   GI/Hepatic negative GI ROS, Neg liver ROS, GERD  ,  Endo/Other  negative endocrine ROS  Renal/GU negative Renal ROS  negative genitourinary   Musculoskeletal   Abdominal (+) + obese,   Peds negative pediatric ROS (+)  Hematology negative hematology ROS (+)   Anesthesia Other Findings Past Medical History: No date: Anterior urethral stricture No date: Anxiety No date: Arthritis     Comment:  a. knees, hips, hands;  b. 11/2013 s/p L TKA @ [facility]. No date: Bile reflux gastritis No date: Bulging lumbar disc No date: BXO (balanitis xerotica obliterans) No date: Complete heart block (HCC)     Comment:  a. s/p MDT dual chamber (His bundle) pacemaker 01/2016 Dr              Caryl Comes No date: Depression No date: DVT (deep venous thrombosis) ([place]) No date: Erosive esophagitis No date: Gross hematuria No date: Hyperlipemia No date: Hypertension     Comment:  borderline No date: Internal hemorrhoids No date: Phimosis No date: Pulmonary embolism [facility])  Past Surgical History: 05/17/2020: ATRIAL FIBRILLATION ABLATION; N/A     Comment:  Procedure: ATRIAL FIBRILLATION ABLATION;  Surgeon:               Vickie Epley, MD;  Location: [place] [facility];                Service: Cardiovascular;  Laterality: N/A; 01/06/2015: [place] Anes;  Bilateral     Comment:  Procedure: BUNIONECTOMY;  Surgeon: [name], MD;                Location: [facility] ORS;  Service: Orthopedics;  Laterality:               Bilateral; ~ 2005: CARDIAC CATHETERIZATION     Comment:  "once" ~ 2010: CATARACT EXTRACTION W/ INTRAOCULAR LENS  IMPLANT, BILATERAL;  Bilateral 12/07/2015: COLONOSCOPY WITH PROPOFOL; N/A     Comment:  Procedure: COLONOSCOPY WITH PROPOFOL;  Surgeon: Lollie Sails, MD;  Location: [facility] ENDOSCOPY;  Service:               Endoscopy;  Laterality: N/A; 02/16/2016: EP IMPLANTABLE DEVICE; N/A     Comment:  MDT dual chamber (His Bundle) pacemaker implanted by Dr               Caryl Comes for intermittent complete heart block 12/07/2015: ESOPHAGOGASTRODUODENOSCOPY (EGD) WITH PROPOFOL; N/A     Comment:  Procedure: ESOPHAGOGASTRODUODENOSCOPY (EGD) WITH               PROPOFOL;  Surgeon: Lollie Sails, MD;  Location:               [facility] ENDOSCOPY;  Service: Endoscopy;  Laterality: N/A; 05/17/2017: ESOPHAGOGASTRODUODENOSCOPY (EGD) WITH PROPOFOL; N/A     Comment:  Procedure:  ESOPHAGOGASTRODUODENOSCOPY (EGD) WITH               PROPOFOL;  Surgeon: Lollie Sails, MD;  Location:               [facility] ENDOSCOPY;  Service: Endoscopy;  Laterality: N/A; 01/06/2015: HAMMER TOE SURGERY; Bilateral     Comment:  Procedure: HAMMER TOE CORRECTION;  Surgeon: [name], MD;  Location: [facility] ORS;  Service: Orthopedics;                Laterality: Bilateral; 1965: KNEE CARTILAGE SURGERY; Left     Comment:  "football injury" No date: Left Total Knee Arthroplasty     Comment:  a. 11/2013 [facility]. 2017: PACEMAKER IMPLANT 1970's: PILONIDAL CYST EXCISION 2004: TOTAL HIP ARTHROPLASTY; Right 2006: TOTAL HIP ARTHROPLASTY; Left No date: UPPER GI ENDOSCOPY  BMI    Body Mass Index: 40.03 kg/m      Reproductive/Obstetrics negative OB ROS                             Anesthesia Physical Anesthesia  Plan  ASA: 3  Anesthesia Plan: General   Post-op Pain Management:    Induction: Intravenous  PONV Risk Score and Plan: Propofol infusion and TIVA  Airway Management Planned: Nasal Cannula  Additional Equipment:   Intra-op Plan:   Post-operative Plan:   Informed Consent: I have reviewed the patients History and Physical, chart, labs and discussed the procedure including the risks, benefits and alternatives for the proposed anesthesia with the patient or authorized representative who has indicated his/her understanding and acceptance.     Dental Advisory Given  Plan Discussed with: CRNA and Surgeon  Anesthesia Plan Comments:         Anesthesia Quick Evaluation

## 2020-11-25 NOTE — H&P (Signed)
Nicholas Darby, MD [address]  [place]  [place], [place] [address]  Main: [phone]  Fax: [phone] Pager: [phone]  Primary Care Physician:  Venita Lick, NP Primary Gastroenterologist:  Dr. Cephas [name]  Pre-Procedure History & Physical: HPI:  Nicholas [name] is a 75 y.o. male is here for an colonoscopy.   Past Medical History:  Diagnosis Date   Anterior urethral stricture    Anxiety    Arthritis    a. knees, hips, hands;  b. 11/2013 s/p L TKA @ [facility].   Bile reflux gastritis    Bulging lumbar disc    BXO (balanitis xerotica obliterans)    Complete heart block ([place])    a. s/p MDT dual chamber (His bundle) pacemaker 01/2016 Dr Caryl Comes   Depression    DVT (deep venous thrombosis) ([place])    Erosive esophagitis    Gross hematuria    Hyperlipemia    Hypertension    borderline   Internal hemorrhoids    Phimosis    Pulmonary embolism ([place])     Past Surgical History:  Procedure Laterality Date   ATRIAL FIBRILLATION ABLATION N/A 05/17/2020   Procedure: ATRIAL FIBRILLATION ABLATION;  Surgeon: Vickie Epley, MD;  Location: [place] [facility];  Service: Cardiovascular;  Laterality: N/A;   BUNIONECTOMY Bilateral 01/06/2015   Procedure: Nicholas [name];  Surgeon: [name], MD;  Location: [facility] ORS;  Service: Orthopedics;  Laterality: Bilateral;   CARDIAC CATHETERIZATION  ~ 2005   "once"   CATARACT EXTRACTION W/ INTRAOCULAR LENS  IMPLANT, BILATERAL Bilateral ~ 2010   COLONOSCOPY WITH PROPOFOL N/A 12/07/2015   Procedure: COLONOSCOPY WITH PROPOFOL;  Surgeon: Lollie Sails, MD;  Location: [facility] ENDOSCOPY;  Service: Endoscopy;  Laterality: N/A;   EP IMPLANTABLE DEVICE N/A 02/16/2016   MDT dual chamber (His Bundle) pacemaker implanted by Dr Caryl Comes for intermittent complete heart block   ESOPHAGOGASTRODUODENOSCOPY (EGD) WITH PROPOFOL N/A 12/07/2015   Procedure: ESOPHAGOGASTRODUODENOSCOPY (EGD) WITH PROPOFOL;  Surgeon: Lollie Sails, MD;  Location: [facility]  ENDOSCOPY;  Service: Endoscopy;  Laterality: N/A;   ESOPHAGOGASTRODUODENOSCOPY (EGD) WITH PROPOFOL N/A 05/17/2017   Procedure: ESOPHAGOGASTRODUODENOSCOPY (EGD) WITH PROPOFOL;  Surgeon: Lollie Sails, MD;  Location: [facility] ENDOSCOPY;  Service: Endoscopy;  Laterality: N/A;   HAMMER TOE SURGERY Bilateral 01/06/2015   Procedure: HAMMER TOE CORRECTION;  Surgeon: [name], MD;  Location: [facility] ORS;  Service: Orthopedics;  Laterality: Bilateral;   KNEE CARTILAGE SURGERY Left 1965   "football injury"   Left Total Knee Arthroplasty     a. 11/2013 [facility].   PACEMAKER IMPLANT  2017   PILONIDAL CYST EXCISION  1970's   TOTAL HIP ARTHROPLASTY Right 2004   TOTAL HIP ARTHROPLASTY Left 2006   UPPER GI ENDOSCOPY      Prior to Admission medications   Medication Sig Start Date End Date Taking? Authorizing Provider  acetaminophen (TYLENOL) 650 MG CR tablet Take 650 mg by mouth every 8 (eight) hours as needed for pain.   Yes [provider]  Ascorbic Acid (VITAMIN C) 1000 MG tablet Take 1,000 mg by mouth daily.   Yes [provider]  Cranberry 500 MG CAPS Take 500 mg by mouth daily.   Yes [provider]  gabapentin (NEURONTIN) 600 MG tablet Take 1 tablet (600 mg total) by mouth at bedtime. 11/10/19  Yes Cannady, Jolene T, NP  LORazepam (ATIVAN) 1 MG tablet Take 1 tablet (1 mg total) by mouth at bedtime. 08/11/20  Yes Venita Lick, NP  Magnesium 400 MG TABS Take 400 mg by mouth daily.    Yes [provider]  methocarbamol (ROBAXIN) 500 MG tablet Take 1 tablet (500 mg total) by mouth 2 (two) times daily as needed for muscle spasms. 05/13/19  Yes [name], MD  midodrine (PROAMATINE) 10 MG tablet TAKE 1 TABLET BY MOUTH THREE TIMES A DAY 07/21/20  Yes Deboraha Sprang, MD  Multiple Vitamin (MULTIVITAMIN WITH MINERALS) TABS tablet Take 1 tablet by mouth daily. Centrum Silver   Yes [provider]  Omega-3 Fatty Acids (FISH OIL) 1200 MG CAPS Take 1,200 mg by  mouth daily.   Yes [provider]  QUEtiapine Fumarate (SEROQUEL XR) 150 MG 24 hr tablet TAKE 1 TABLET (150 MG TOTAL) BY MOUTH AT BEDTIME. 03/05/20  Yes Cannady, Jolene T, NP  rOPINIRole (REQUIP) 0.25 MG tablet Take 0.25 mg by mouth 4 (four) times daily. 03/23/20  Yes [provider]  rosuvastatin (CRESTOR) 40 MG tablet Take 1 tablet (40 mg total) by mouth daily. 02/13/20  Yes Cannady, Jolene T, NP  solifenacin (VESICARE) 10 MG tablet TAKE 1 TABLET BY MOUTH EVERY DAY 09/07/20  Yes Cannady, Jolene T, NP  torsemide (DEMADEX) 20 MG tablet Take 40 mg by mouth daily as needed (fluid retention).   Yes [provider]  venlafaxine XR (EFFEXOR XR) 75 MG 24 hr capsule Take 3 capsules (225 mg total) by mouth daily. 08/11/20  Yes Cannady, Jolene T, NP  apixaban (ELIQUIS) 5 MG TABS tablet Take 1 tablet (5 mg total) by mouth 2 (two) times daily. 10/27/20   Deboraha Sprang, MD  hyoscyamine (LEVSIN, ANASPAZ) 0.125 MG tablet Take 0.125 mg by mouth every 6 (six) hours as needed for bladder spasms or cramping.  09/07/15   [provider]  neomycin-polymyxin b-dexamethasone (MAXITROL) 3.5-10000-0.1 OINT 2 (two) times daily. 11/10/20   [provider]  nystatin (MYCOSTATIN/NYSTOP) powder Apply 1 application topically 3 (three) times daily. 04/16/20   Cannady, [name], NP  oxymetazoline (AFRIN) 0.05 % nasal spray Place 2 sprays into both nostrils at bedtime.    [provider]  pantoprazole (PROTONIX) 20 MG tablet TAKE 2 TABLETS (40 MG TOTAL) BY MOUTH DAILY. Patient taking differently: Take 20 mg by mouth daily. 09/03/20   Cannady, [name], NP  potassium chloride SA (KLOR-CON) 20 MEQ tablet Take 2 tablets (40 meq) x 1 dose as directed Patient taking differently: Take 40 mEq by mouth daily as needed (with torsemide (fluid retention)). 12/16/19   Deboraha Sprang, MD    Allergies as of 11/22/2020   (No Known Allergies)    Family History  Problem Relation Age of Onset    Alcoholism Mother        died in her 42's.   Cancer Mother    Stroke Father        deceased   Diabetes Father    Hypertension Father    Glaucoma Sister    Breast cancer Sister    Diabetes Daughter    Asthma Son    Stroke Paternal Grandmother    Prostate cancer Paternal Grandfather     Social History   Socioeconomic History   Marital status: Married    Spouse name: Not on file   Number of children: Not on file   Years of education: Not on file   Highest education level: High school graduate  Occupational History   Occupation: retired   Tobacco Use   Smoking status: Never   Smokeless tobacco:  Never  Vaping Use   Vaping Use: Never used  Substance and Sexual Activity   Alcohol use: Yes    Alcohol/week: 1.0 standard drink    Types: 1 Cans of beer per week    Comment: beers seldom    Drug use: No   Sexual activity: Not Currently  Other Topics Concern   Not on file  Social History Narrative   Lives in [place] with wife.  Does not routinely exercise.  Activity severely limited by bilateral knee pain.   Social Determinants of Health   Financial Resource Strain: Low Risk    Difficulty of Paying Living Expenses: Not hard at all  Food Insecurity: No Food Insecurity   Worried About Charity fundraiser in the Last Year: Never true   [place] in the Last Year: Never true  Transportation Needs: No Transportation Needs   Lack of Transportation (Medical): No   Lack of Transportation (Non-Medical): No  Physical Activity: Inactive   Days of Exercise per Week: 0 days   Minutes of Exercise per Session: 0 min  Stress: No Stress Concern Present   Feeling of Stress : Not at all  Social Connections: Not on file  Intimate Partner Violence: Not on file    Review of Systems: See HPI, otherwise negative ROS  Physical Exam: BP 115/86   Pulse 86   Temp 98.8 F (37.1 C) (Temporal)   Resp 17   Ht 5\' 10"  (1.778 m)   Wt 126.6 kg   SpO2 96%   BMI 40.03 kg/m  General:    Alert,  pleasant and cooperative in NAD Head:  Normocephalic and atraumatic. Neck:  Supple; no masses or thyromegaly. Lungs:  Clear throughout to auscultation.    Heart:  Regular rate and rhythm. Abdomen:  Soft, nontender and nondistended. Normal bowel sounds, without guarding, and without rebound.   Neurologic:  Alert and  oriented x4;  grossly normal neurologically.  Impression/Plan: Nicholas [name] is here for an colonoscopy to be performed for RLQ pain  Risks, benefits, limitations, and alternatives regarding  colonoscopy have been reviewed with the patient.  Questions have been answered.  All parties agreeable.   Sherri Sear, MD  11/25/2020, 9:10 AM

## 2020-11-25 NOTE — Progress Notes (Signed)
Pt not cleaned out. Pt instructed to take more prep and return tomorrow for procedure.

## 2020-11-26 ENCOUNTER — Ambulatory Visit: Admission: RE | Admit: 2020-11-26 | Payer: Medicare Other | Source: Home / Self Care | Admitting: Gastroenterology

## 2020-11-26 ENCOUNTER — Ambulatory Visit: Payer: Medicare Other | Admitting: Certified Registered Nurse Anesthetist

## 2020-11-26 ENCOUNTER — Encounter: Payer: Self-pay | Admitting: Gastroenterology

## 2020-11-26 ENCOUNTER — Encounter
Admission: RE | Disposition: A | Discharge status: Home / Self Care | Payer: Self-pay | Source: Ambulatory Visit | Attending: Gastroenterology

## 2020-11-26 DIAGNOSIS — R1031 Right lower quadrant pain: Secondary | ICD-10-CM

## 2020-11-26 DIAGNOSIS — G8929 Other chronic pain: Secondary | ICD-10-CM

## 2020-11-26 HISTORY — PX: COLONOSCOPY WITH PROPOFOL: SHX5780

## 2020-11-26 SURGERY — COLONOSCOPY WITH PROPOFOL
Anesthesia: General

## 2020-11-26 MED ORDER — LIDOCAINE HCL (CARDIAC) PF 100 MG/5ML IV SOSY
PREFILLED_SYRINGE | INTRAVENOUS | Status: DC | PRN
  Administered 2020-11-26: 50 mg via INTRAVENOUS

## 2020-11-26 MED ORDER — SODIUM CHLORIDE 0.9 % IV SOLN: INTRAVENOUS | Status: DC

## 2020-11-26 MED ORDER — PROPOFOL 500 MG/50ML IV EMUL
INTRAVENOUS | Status: DC | PRN
  Administered 2020-11-26: 130 ug/kg/min via INTRAVENOUS

## 2020-11-26 MED ORDER — PROPOFOL 10 MG/ML IV BOLUS
INTRAVENOUS | Status: DC | PRN
  Administered 2020-11-26: 80 mg via INTRAVENOUS

## 2020-11-26 NOTE — Transfer of Care (Signed)
Immediate Anesthesia Transfer of Care Note  Patient: Nicholas [name]  Procedure(s) Performed: COLONOSCOPY WITH PROPOFOL  Patient Location: PACU and Endoscopy Unit  Anesthesia Type:General  Level of Consciousness: drowsy  Airway & Oxygen Therapy: Patient Spontanous Breathing  Post-op Assessment: Report given to RN  Post vital signs: stable  Last Vitals:  Vitals Value Taken Time  BP    Temp    Pulse    Resp    SpO2      Last Pain:  Vitals:   11/26/20 1204  TempSrc: Temporal  PainSc: 0-No pain         Complications: No notable events documented.

## 2020-11-26 NOTE — Anesthesia Postprocedure Evaluation (Signed)
Anesthesia Post Note  Patient: Nicholas [name]  Procedure(s) Performed: COLONOSCOPY WITH PROPOFOL  Patient location during evaluation: PACU Anesthesia Type: General Level of consciousness: awake and alert Pain management: pain level controlled Vital Signs Assessment: post-procedure vital signs reviewed and stable Respiratory status: spontaneous breathing, nonlabored ventilation and respiratory function stable Cardiovascular status: blood pressure returned to baseline and stable Postop Assessment: no apparent nausea or vomiting Anesthetic complications: no   No notable events documented.   Last Vitals:  Vitals:   11/26/20 1315 11/26/20 1323  BP: 133/68 (!) 140/52  Pulse: 70 69  Resp: 13 16  Temp:    SpO2: 94% 94%    Last Pain:  Vitals:   11/26/20 1323  TempSrc:   PainSc: 0-No pain                 Brett Canales Maizy Davanzo

## 2020-11-29 ENCOUNTER — Encounter: Payer: Self-pay | Admitting: Gastroenterology

## 2020-11-29 NOTE — Op Note (Signed)
[facility] Patient Name: Nicholas [name] Procedure Date: 11/26/2020 12:39 PM MRN: [id number] Account #: [id number] Date of Birth: [birth year] Admit Type: Outpatient Age: 75 Room: [facility] ENDO ROOM 2 Gender: Male Note Status: Finalized Instrument Name: Jasper Riling [id number] Procedure:             Colonoscopy Indications:           Abdominal pain in the right lower quadrant Providers:             Lin Landsman MD, MD Referring MD:          Barbaraann Faster. Ned Card (Referring MD) Medicines:             General Anesthesia Complications:         No immediate complications. Estimated blood loss: None. Procedure:             Pre-Anesthesia Assessment:                        - Prior to the procedure, a History and Physical was                         performed, and patient medications and allergies were                         reviewed. The patient is competent. The risks and                         benefits of the procedure and the sedation options and                         risks were discussed with the patient. All questions                         were answered and informed consent was obtained.                         Patient identification and proposed procedure were                         verified by the physician, the nurse, the                         anesthesiologist, the anesthetist and the technician                         in the pre-procedure area in the procedure room in the                         endoscopy suite. Mental Status Examination: alert and                         oriented. Airway Examination: normal oropharyngeal                         airway and neck mobility. Respiratory Examination:                         clear to auscultation. CV Examination: normal.  Prophylactic Antibiotics: The patient does not require                         prophylactic antibiotics. Prior Anticoagulants: The                          patient has taken Eliquis (apixaban), last dose was 4                         days prior to procedure. ASA Grade Assessment: III - A                         patient with severe systemic disease. After reviewing                         the risks and benefits, the patient was deemed in                         satisfactory condition to undergo the procedure. The                         anesthesia plan was to use general anesthesia.                         Immediately prior to administration of medications,                         the patient was re-assessed for adequacy to receive                         sedatives. The heart rate, respiratory rate, oxygen                         saturations, blood pressure, adequacy of pulmonary                         ventilation, and response to care were monitored                         throughout the procedure. The physical status of the                         patient was re-assessed after the procedure.                        After obtaining informed consent, the colonoscope was                         passed under direct vision. Throughout the procedure,                         the patient's blood pressure, pulse, and oxygen                         saturations were monitored continuously. The                         Colonoscope was introduced through the anus and  advanced to the 10 cm into the ileum. The colonoscopy                         was performed without difficulty. The patient                         tolerated the procedure well. The quality of the bowel                         preparation was good. Findings:      Skin tags were found on perianal exam.      The terminal ileum appeared normal.      The entire examined colon appeared normal.      Non-bleeding external hemorrhoids were found during retroflexion. The       hemorrhoids were large. Impression:            - Perianal skin tags found on perianal exam.                         - The examined portion of the ileum was normal.                        - The entire examined colon is normal.                        - No specimens collected. Recommendation:        - Discharge patient to home (with spouse).                        - Right lower quadrant pain is secondary to                         constipation                        - High fiber diet indefinitely.                        - Start miaralx 1-2 times daily or linzess 16mcg daily                        - Continue present medications.                        - Resume Eliquis (apixaban) at prior dose today. Refer                         to managing physician for further adjustment of                         therapy. Procedure Code(s):     --- Professional ---                        [phone], Colonoscopy, flexible; diagnostic, including                         collection of specimen(s) by brushing or washing, when  performed (separate procedure) Diagnosis Code(s):     --- Professional ---                        K64.4, Residual hemorrhoidal skin tags                        R10.31, Right lower quadrant pain CPT copyright 2019 [employer]. All rights reserved. The codes documented in this report are preliminary and upon coder review may  be revised to meet current compliance requirements. Dr. Ulyess Mort Lin Landsman MD, MD 11/26/2020 1:06:57 PM This report has been signed electronically. Number of Addenda: 0 Note Initiated On: 11/26/2020 12:39 PM Scope Withdrawal Time: 0 hours 6 minutes 41 seconds  Total Procedure Duration: 0 hours 11 minutes 53 seconds  Estimated Blood Loss:  Estimated blood loss: none. Estimated blood loss: none.      [facility]

## 2020-12-05 NOTE — Progress Notes (Signed)
Virtual Visit via Video Note   This visit type was conducted due to national recommendations for restrictions regarding the COVID-19 Pandemic (e.g. social distancing) in an effort to limit this patient's exposure and mitigate transmission in our community.  Due to his co-morbid illnesses, this patient is at least at moderate risk for complications without adequate follow up.  This format is felt to be most appropriate for this patient at this time.  All issues noted in this document were discussed and addressed.  A limited physical exam was performed with this format.  Please refer to the patient's chart for his consent to telehealth for [facility].  Date:  12/06/2020   ID:  Nicholas [name], DOB October 04, 1945, MRN [id number] The patient was identified using 2 identifiers.  Patient Location: Home Provider Location: Home Office   PCP:  Venita Lick, NP   [facility] Providers Cardiologist:  Virl Axe, MD Electrophysiologist:  Vickie Epley, MD     Evaluation Performed:  Follow-Up Visit  Chief Complaint:  OSA  History of Present Illness:    Nicholas [name] is a 75 y.o. male with a hx of anxiety, HTN, PE, heart block s/p PPM and PAF who was referred for sleep study due to afib.  He underwent home sleep study showing severe OSA with an AHI of 45.7/hr and nocturnal hypoxemia with O2 sats as low as 79%.  He underwent CPAP titration to 15cm H2O and is now here for followup.  He has really been struggling with his mask.  He uses a full face mask and says that the mask leaks a lot and he has tried 3 different masks and all have the same problem.  He cannot sleep well because of it.  does He tolerates the mask and feels the pressure is adequate.  The mask noise keeps him and his wife asleep.  He has been back to the DME to have a mask fit in person.    The patient does not have symptoms concerning for COVID-19 infection (fever, chills, cough, or new shortness of breath).    Past  Medical History:  Diagnosis Date   Anterior urethral stricture    Anxiety    Arthritis    a. knees, hips, hands;  b. 11/2013 s/p L TKA @ [place].   Bile reflux gastritis    Bulging lumbar disc    BXO (balanitis xerotica obliterans)    Complete heart block ([place])    a. s/p MDT dual chamber (His bundle) pacemaker 01/2016 Dr Caryl Comes   Depression    DVT (deep venous thrombosis) ([place])    Erosive esophagitis    Gross hematuria    Hyperlipemia    Hypertension    borderline   Internal hemorrhoids    Phimosis    Pulmonary embolism ([place])    Past Surgical History:  Procedure Laterality Date   ATRIAL FIBRILLATION ABLATION N/A 05/17/2020   Procedure: ATRIAL FIBRILLATION ABLATION;  Surgeon: Vickie Epley, MD;  Location: [facility];  Service: Cardiovascular;  Laterality: N/A;   BUNIONECTOMY Bilateral 01/06/2015   Procedure: Lillard Anes;  Surgeon: [name], MD;  Location: [facility] ORS;  Service: Orthopedics;  Laterality: Bilateral;   CARDIAC CATHETERIZATION  ~ 2005   "once"   CATARACT EXTRACTION W/ INTRAOCULAR LENS  IMPLANT, BILATERAL Bilateral ~ 2010   COLONOSCOPY WITH PROPOFOL N/A 12/07/2015   Procedure: COLONOSCOPY WITH PROPOFOL;  Surgeon: Lollie Sails, MD;  Location: [facility] ENDOSCOPY;  Service: Endoscopy;  Laterality: N/A;  COLONOSCOPY WITH PROPOFOL N/A 11/26/2020   Procedure: COLONOSCOPY WITH PROPOFOL;  Surgeon: Lin Landsman, MD;  Location: [facility] ENDOSCOPY;  Service: Gastroenterology;  Laterality: N/A;   EP IMPLANTABLE DEVICE N/A 02/16/2016   MDT dual chamber (His Bundle) pacemaker implanted by Dr Caryl Comes for intermittent complete heart block   ESOPHAGOGASTRODUODENOSCOPY (EGD) WITH PROPOFOL N/A 12/07/2015   Procedure: ESOPHAGOGASTRODUODENOSCOPY (EGD) WITH PROPOFOL;  Surgeon: Lollie Sails, MD;  Location: [facility] ENDOSCOPY;  Service: Endoscopy;  Laterality: N/A;   ESOPHAGOGASTRODUODENOSCOPY (EGD) WITH PROPOFOL N/A 05/17/2017   Procedure: ESOPHAGOGASTRODUODENOSCOPY (EGD)  WITH PROPOFOL;  Surgeon: Lollie Sails, MD;  Location: [facility] ENDOSCOPY;  Service: Endoscopy;  Laterality: N/A;   HAMMER TOE SURGERY Bilateral 01/06/2015   Procedure: HAMMER TOE CORRECTION;  Surgeon: [name], MD;  Location: [facility] ORS;  Service: Orthopedics;  Laterality: Bilateral;   KNEE CARTILAGE SURGERY Left 1965   "football injury"   Left Total Knee Arthroplasty     a. 11/2013 [facility].   PACEMAKER IMPLANT  2017   PILONIDAL CYST EXCISION  1970's   TOTAL HIP ARTHROPLASTY Right 2004   TOTAL HIP ARTHROPLASTY Left 2006   UPPER GI ENDOSCOPY       Current Meds  Medication Sig   acetaminophen (TYLENOL) 650 MG CR tablet Take 650 mg by mouth every 8 (eight) hours as needed for pain.   apixaban (ELIQUIS) 5 MG TABS tablet Take 1 tablet (5 mg total) by mouth 2 (two) times daily.   Ascorbic Acid (VITAMIN C) 1000 MG tablet Take 1,000 mg by mouth daily.   Cranberry 500 MG CAPS Take 500 mg by mouth daily.   gabapentin (NEURONTIN) 600 MG tablet Take 1 tablet (600 mg total) by mouth at bedtime.   LORazepam (ATIVAN) 1 MG tablet Take 1 tablet (1 mg total) by mouth at bedtime.   Magnesium 400 MG TABS Take 400 mg by mouth daily.    MELATONIN PO Take by mouth.   methocarbamol (ROBAXIN) 500 MG tablet Take 1 tablet (500 mg total) by mouth 2 (two) times daily as needed for muscle spasms.   midodrine (PROAMATINE) 10 MG tablet TAKE 1 TABLET BY MOUTH THREE TIMES A DAY   Multiple Vitamin (MULTIVITAMIN WITH MINERALS) TABS tablet Take 1 tablet by mouth daily. Centrum Silver   Omega-3 Fatty Acids (FISH OIL) 1200 MG CAPS Take 1,200 mg by mouth daily.   oxymetazoline (AFRIN) 0.05 % nasal spray Place 2 sprays into both nostrils at bedtime.   pantoprazole (PROTONIX) 20 MG tablet TAKE 2 TABLETS (40 MG TOTAL) BY MOUTH DAILY. (Patient taking differently: Take 20 mg by mouth daily.)   potassium chloride SA (KLOR-CON) 20 MEQ tablet Take 2 tablets (40 meq) x 1 dose as directed (Patient taking differently: Take 40 mEq by  mouth daily as needed (with torsemide (fluid retention)).)   QUEtiapine Fumarate (SEROQUEL XR) 150 MG 24 hr tablet TAKE 1 TABLET (150 MG TOTAL) BY MOUTH AT BEDTIME.   rOPINIRole (REQUIP) 0.25 MG tablet Take 0.25 mg by mouth 4 (four) times daily.   rosuvastatin (CRESTOR) 40 MG tablet Take 1 tablet (40 mg total) by mouth daily.   solifenacin (VESICARE) 10 MG tablet TAKE 1 TABLET BY MOUTH EVERY DAY   venlafaxine XR (EFFEXOR XR) 75 MG 24 hr capsule Take 3 capsules (225 mg total) by mouth daily.     Allergies:   Patient has no known allergies.   Social History   Tobacco Use   Smoking status: Never   Smokeless tobacco: Never  Vaping Use  Vaping Use: Never used  Substance Use Topics   Alcohol use: Not Currently   Drug use: No     Family Hx: The patient's family history includes Alcoholism in his mother; Asthma in his son; Breast cancer in his sister; Cancer in his mother; Diabetes in his daughter and father; Glaucoma in his sister; Hypertension in his father; Prostate cancer in his paternal grandfather; Stroke in his father and paternal grandmother.  ROS:   Please see the history of present illness.     All other systems reviewed and are negative.   Prior CV studies:   The following studies were reviewed today:  Home sleep study, PAP titration and PAP compliance download  Labs/Other Tests and Data Reviewed:    EKG:  No ECG reviewed.  Recent Labs: 08/11/2020: TSH 2.180 11/16/2020: ALT 20; BUN 8; Creatinine, Ser 1.03; Hemoglobin 15.3; Magnesium 2.2; Platelets 170; Potassium 4.2; Sodium 140   Recent Lipid Panel Lab Results  Component Value Date/Time   CHOL 140 08/11/2020 08:43 AM   CHOL 198 01/03/2018 04:10 PM   TRIG 118 08/11/2020 08:43 AM   TRIG 264 (H) 01/03/2018 04:10 PM   HDL 54 08/11/2020 08:43 AM   CHOLHDL 3.6 07/16/2018 08:13 AM   LDLCALC 65 08/11/2020 08:43 AM    Wt Readings from Last 3 Encounters:  12/06/20 279 lb (126.6 kg)  11/26/20 279 lb (126.6 kg)   11/22/20 281 lb 12.8 oz (127.8 kg)     Risk Assessment/Calculations:    CHA2DS2-VASc Score = 4   This indicates a 4.8% annual risk of stroke. The patient's score is based upon: CHF History: 0 HTN History: 1 Diabetes History: 0 Stroke History: 0 Vascular Disease History: 1 Age Score: 2 Gender Score: 0       Objective:    Vital Signs:  Ht 5\' 10"  (1.778 m)   Wt 279 lb (126.6 kg)   BMI 40.03 kg/m    VITAL SIGNS:  reviewed GEN:  no acute distress EYES:  sclerae anicteric, EOMI - Extraocular Movements Intact RESPIRATORY:  normal respiratory effort, symmetric expansion CARDIOVASCULAR:  no peripheral edema SKIN:  no rash, lesions or ulcers. MUSCULOSKELETAL:  no obvious deformities. NEURO:  alert and oriented x 3, no obvious focal deficit PSYCH:  normal affect  ASSESSMENT & PLAN:    OSA - The patient is tolerating PAP therapy well without any problems.  The patient has been using and benefiting from PAP use and will continue to benefit from therapy.  -the device did not record any data and he says that he wears it 4 hours nightly -I am going to change him to auto CPAP to see if lowering the pressure at times may help decreased the mask leak   PAF -he denies any palpitations -continue prescription drug management with Eliquis 5mg  BID    COVID-19 Education: The signs and symptoms of COVID-19 were discussed with the patient and how to seek care for testing (follow up with PCP or arrange E-visit).  The importance of social distancing was discussed today.  Time:   Today, I have spent 20 minutes with the patient with telehealth technology discussing the above problems.     Medication Adjustments/Labs and Tests Ordered: Current medicines are reviewed at length with the patient today.  Concerns regarding medicines are outlined above.   Tests Ordered: No orders of the defined types were placed in this encounter.   Medication Changes: No orders of the defined types were  placed in this encounter.  Follow Up:  Virtual Visit  in 4 week(s)  Signed, Fransico Him, MD  12/06/2020 10:13 AM    [place]

## 2020-12-06 ENCOUNTER — Encounter: Payer: Self-pay | Admitting: Cardiology

## 2020-12-06 ENCOUNTER — Other Ambulatory Visit: Payer: Self-pay

## 2020-12-06 ENCOUNTER — Telehealth (INDEPENDENT_AMBULATORY_CARE_PROVIDER_SITE_OTHER): Payer: Medicare Other | Admitting: Cardiology

## 2020-12-06 VITALS — Ht 70.0 in | Wt 279.0 lb

## 2020-12-06 DIAGNOSIS — G4733 Obstructive sleep apnea (adult) (pediatric): Secondary | ICD-10-CM | POA: Diagnosis not present

## 2020-12-06 DIAGNOSIS — I480 Paroxysmal atrial fibrillation: Secondary | ICD-10-CM

## 2020-12-06 NOTE — Patient Instructions (Signed)
Medication Instructions:  Your physician recommends that you continue on your current medications as directed. Please refer to the Current Medication list given to you today.   *If you need a refill on your cardiac medications before your next appointment, please call your pharmacy*  Follow-Up: At [facility], you and your health needs are our priority.  As part of our continuing mission to provide you with exceptional heart care, we have created designated Provider Care Teams.  These Care Teams include your primary Cardiologist (physician) and Advanced Practice Providers (APPs -  Physician Assistants and Nurse Practitioners) who all work together to provide you with the care you need, when you need it.  Your next appointment:   4 week(s)  The format for your next appointment:   Virtual Visit   Provider:   Fransico Him, MD

## 2020-12-10 ENCOUNTER — Other Ambulatory Visit: Payer: Self-pay | Admitting: Nurse Practitioner

## 2020-12-10 NOTE — Telephone Encounter (Signed)
Requested Prescriptions  Refused Prescriptions Disp Refills  . pantoprazole (PROTONIX) 20 MG tablet [Pharmacy Med Name: PANTOPRAZOLE SOD DR 20 MG TAB] 90 tablet 3    Sig: TAKE 1 TABLET BY MOUTH EVERY DAY     Gastroenterology: Proton Pump Inhibitors Passed - 12/10/2020  9:13 AM      Passed - Valid encounter within last 12 months    Recent Outpatient Visits          3 weeks ago Morbid obesity ([place])   [place], [name], NP   1 month ago Need for influenza vaccination   [place], Lauren A, NP   2 months ago Right lower quadrant abdominal pain   [place], [place] T, NP   4 months ago Depression, recurrent ([place])   [place], [name], NP   4 months ago Cough   [place], Barbaraann Faster, NP      Future Appointments            In 1 week Cannady, Barbaraann Faster, NP [facility] [facility], [facility]   In 3 weeks [place], Eber Hong, MD [place], [facility]   In 4 months  [place], [facility]           . gabapentin (NEURONTIN) 600 MG tablet [Pharmacy Med Name: GABAPENTIN 600 MG TABLET] 90 tablet 4    Sig: TAKE 1 TABLET BY MOUTH AT BEDTIME.     Neurology: Anticonvulsants - gabapentin Passed - 12/10/2020  9:13 AM      Passed - Valid encounter within last 12 months    Recent Outpatient Visits          3 weeks ago Morbid obesity ([place])   [place], [name], NP   1 month ago Need for influenza vaccination   [place], Lauren A, NP   2 months ago Right lower quadrant abdominal pain   [place], [place] T, NP   4 months ago Depression, recurrent ([place])   [place], [name], NP   4 months ago Cough   [place], Barbaraann Faster, NP      Future Appointments            In 1 week Cannady, Barbaraann Faster, NP [facility], [facility]   In 3 weeks  [place], Eber Hong, MD [place], [facility]   In 4 months  [employer], [place]

## 2020-12-13 ENCOUNTER — Other Ambulatory Visit: Payer: Self-pay

## 2020-12-13 ENCOUNTER — Ambulatory Visit (INDEPENDENT_AMBULATORY_CARE_PROVIDER_SITE_OTHER): Payer: Medicare Other

## 2020-12-13 ENCOUNTER — Encounter: Payer: Self-pay | Admitting: Anesthesiology

## 2020-12-13 ENCOUNTER — Ambulatory Visit: Payer: Medicare Other | Attending: Anesthesiology | Admitting: Anesthesiology

## 2020-12-13 DIAGNOSIS — M51369 Other intervertebral disc degeneration, lumbar region without mention of lumbar back pain or lower extremity pain: Secondary | ICD-10-CM

## 2020-12-13 DIAGNOSIS — G894 Chronic pain syndrome: Secondary | ICD-10-CM | POA: Diagnosis not present

## 2020-12-13 DIAGNOSIS — M5416 Radiculopathy, lumbar region: Secondary | ICD-10-CM | POA: Diagnosis not present

## 2020-12-13 DIAGNOSIS — G8929 Other chronic pain: Secondary | ICD-10-CM

## 2020-12-13 DIAGNOSIS — I442 Atrioventricular block, complete: Secondary | ICD-10-CM

## 2020-12-13 DIAGNOSIS — M5432 Sciatica, left side: Secondary | ICD-10-CM

## 2020-12-13 DIAGNOSIS — M79642 Pain in left hand: Secondary | ICD-10-CM

## 2020-12-13 DIAGNOSIS — M79641 Pain in right hand: Secondary | ICD-10-CM

## 2020-12-13 DIAGNOSIS — R1031 Right lower quadrant pain: Secondary | ICD-10-CM | POA: Insufficient documentation

## 2020-12-13 DIAGNOSIS — F1190 Opioid use, unspecified, uncomplicated: Secondary | ICD-10-CM | POA: Diagnosis not present

## 2020-12-13 DIAGNOSIS — M47816 Spondylosis without myelopathy or radiculopathy, lumbar region: Secondary | ICD-10-CM

## 2020-12-13 DIAGNOSIS — M1611 Unilateral primary osteoarthritis, right hip: Secondary | ICD-10-CM

## 2020-12-13 DIAGNOSIS — M5136 Other intervertebral disc degeneration, lumbar region: Secondary | ICD-10-CM | POA: Diagnosis not present

## 2020-12-13 DIAGNOSIS — M47817 Spondylosis without myelopathy or radiculopathy, lumbosacral region: Secondary | ICD-10-CM

## 2020-12-13 DIAGNOSIS — M5431 Sciatica, right side: Secondary | ICD-10-CM

## 2020-12-13 HISTORY — DX: Other chronic pain: G89.29

## 2020-12-13 MED ORDER — HYDROCODONE-ACETAMINOPHEN 5-325 MG PO TABS: 1.0000 | ORAL_TABLET | Freq: Two times a day (BID) | ORAL | 0 refills | Status: AC

## 2020-12-13 MED ORDER — HYDROCODONE-ACETAMINOPHEN 5-325 MG PO TABS: 1.0000 | ORAL_TABLET | Freq: Two times a day (BID) | ORAL | 0 refills | Status: DC

## 2020-12-13 NOTE — Progress Notes (Signed)
Virtual Visit via Telephone Note  I connected with Nicholas [name] on 12/13/20 at  1:55 PM EST by telephone and verified that I am speaking with the correct person using two identifiers.  Location: Patient: Home Provider: Pain control center   I discussed the limitations, risks, security and privacy concerns of performing an evaluation and management service by telephone and the availability of in person appointments. I also discussed with the patient that there may be a patient responsible charge related to this service. The patient expressed understanding and agreed to proceed.   History of Present Illness: I spoke with daughter today via telephone as we were unable to link for the video portion of this conference.  He reports that he is having considerable right lower quadrant pain of a spasming nature.  He has been through an extensive work-up including a CT scan of the abdomen in addition to colonoscopy without elucidation of the cause.  He has a history of chronic low back pain and lower extremity sciatica symptoms for which he takes his hydrocodone 5 mg tablets twice a day.  This generally gives him good relief and this pain has been stable but the right lower quadrant pain has been quite persistent and intractable.  It is a gnawing aching pain that he describes.  He reports that he did have an MRI and this was reviewed with him today.  Review of systems: General: No fevers or chills Pulmonary: No shortness of breath or dyspnea Cardiac: No angina or palpitations or lightheadedness GI: No abdominal pain or constipation Psych: No depression    Observations/Objective:  Current Outpatient Medications:    [START ON 12/23/2020] HYDROcodone-acetaminophen (NORCO/VICODIN) 5-325 MG tablet, Take 1 tablet by mouth in the morning and at bedtime., Disp: 60 tablet, Rfl: 0   [START ON 01/22/2021] HYDROcodone-acetaminophen (NORCO/VICODIN) 5-325 MG tablet, Take 1 tablet by mouth 2 (two) times daily.,  Disp: 60 tablet, Rfl: 0   acetaminophen (TYLENOL) 650 MG CR tablet, Take 650 mg by mouth every 8 (eight) hours as needed for pain., Disp: , Rfl:    apixaban (ELIQUIS) 5 MG TABS tablet, Take 1 tablet (5 mg total) by mouth 2 (two) times daily., Disp: 180 tablet, Rfl: 1   Ascorbic Acid (VITAMIN C) 1000 MG tablet, Take 1,000 mg by mouth daily., Disp: , Rfl:    Cranberry 500 MG CAPS, Take 500 mg by mouth daily., Disp: , Rfl:    gabapentin (NEURONTIN) 600 MG tablet, Take 1 tablet (600 mg total) by mouth at bedtime., Disp: 90 tablet, Rfl: 4   LORazepam (ATIVAN) 1 MG tablet, Take 1 tablet (1 mg total) by mouth at bedtime., Disp: 90 tablet, Rfl: 0   Magnesium 400 MG TABS, Take 400 mg by mouth daily. , Disp: , Rfl:    MELATONIN PO, Take by mouth., Disp: , Rfl:    methocarbamol (ROBAXIN) 500 MG tablet, Take 1 tablet (500 mg total) by mouth 2 (two) times daily as needed for muscle spasms., Disp: 60 tablet, Rfl: 2   midodrine (PROAMATINE) 10 MG tablet, TAKE 1 TABLET BY MOUTH THREE TIMES A DAY, Disp: 270 tablet, Rfl: 3   Multiple Vitamin (MULTIVITAMIN WITH MINERALS) TABS tablet, Take 1 tablet by mouth daily. Centrum Silver, Disp: , Rfl:    Omega-3 Fatty Acids (FISH OIL) 1200 MG CAPS, Take 1,200 mg by mouth daily., Disp: , Rfl:    oxymetazoline (AFRIN) 0.05 % nasal spray, Place 2 sprays into both nostrils at bedtime., Disp: , Rfl:  pantoprazole (PROTONIX) 20 MG tablet, TAKE 2 TABLETS (40 MG TOTAL) BY MOUTH DAILY. (Patient taking differently: Take 20 mg by mouth daily.), Disp: 90 tablet, Rfl: 4   potassium chloride SA (KLOR-CON) 20 MEQ tablet, Take 2 tablets (40 meq) x 1 dose as directed (Patient taking differently: Take 40 mEq by mouth daily as needed (with torsemide (fluid retention)).), Disp: 10 tablet, Rfl: 0   QUEtiapine Fumarate (SEROQUEL XR) 150 MG 24 hr tablet, TAKE 1 TABLET (150 MG TOTAL) BY MOUTH AT BEDTIME., Disp: 90 tablet, Rfl: 4   rOPINIRole (REQUIP) 0.25 MG tablet, Take 0.25 mg by mouth 4 (four)  times daily., Disp: , Rfl:    rosuvastatin (CRESTOR) 40 MG tablet, Take 1 tablet (40 mg total) by mouth daily., Disp: 90 tablet, Rfl: 4   solifenacin (VESICARE) 10 MG tablet, TAKE 1 TABLET BY MOUTH EVERY DAY, Disp: 90 tablet, Rfl: 1   torsemide (DEMADEX) 20 MG tablet, Take 40 mg by mouth daily as needed (fluid retention)., Disp: , Rfl:    venlafaxine XR (EFFEXOR XR) 75 MG 24 hr capsule, Take 3 capsules (225 mg total) by mouth daily., Disp: 270 capsule, Rfl: 4   Assessment and Plan: 1. Lumbar radiculopathy   2. Degeneration of lumbar intervertebral disc   3. Chronic, continuous use of opioids   4. Chronic pain syndrome   5. Bilateral hand pain   6. Facet syndrome, lumbar   7. Bilateral sciatica   8. Osteoarthritis of right hip, unspecified osteoarthritis type   9. Facet arthritis of lumbosacral region   10. Abdominal pain, chronic, right lower quadrant   Based on our discussion today and upon review of the [facility] practitioner database information is appropriate to refill his medicines.  He is due for refills for December 1 December 31.  This is for twice daily dosing of his hydrocodone.  He continues to do well with chronic opioid management.  This is helping with the right lower quadrant abdominal pain.  He has undergone an extensive work-up without any current understanding as to the cause of the pain.  I have reviewed his distant MRI from 2006.  This did show evidence of disc desiccation at the T11 and T12 region with some bone spurring and as discussed with him today it is curious whether this might be a radicular pain that he is experiencing from the thoracolumbar region.  As reviewed with him today I think he would be a candidate for a diagnostic potential therapeutic thoracic or lumbar epidural steroid injection.  I want him to continue follow-up with his other physicians for continued diagnostic work-up and we will schedule this in the next month or 2 contingent on this  work-up.  Follow Up Instructions:    I discussed the assessment and treatment plan with the patient. The patient was provided an opportunity to ask questions and all were answered. The patient agreed with the plan and demonstrated an understanding of the instructions.   The patient was advised to call back or seek an in-person evaluation if the symptoms worsen or if the condition fails to improve as anticipated.  I provided 30 minutes of non-face-to-face time during this encounter.   [name], MD

## 2020-12-14 LAB — CUP PACEART REMOTE DEVICE CHECK
Battery Remaining Longevity: 33 mo
Battery Voltage: 2.97 V
Brady Statistic AP VP Percent: 40.5 %
Brady Statistic AP VS Percent: 0.01 %
Brady Statistic AS VP Percent: 59.45 %
Brady Statistic AS VS Percent: 0.04 %
Brady Statistic RA Percent Paced: 40.42 %
Brady Statistic RV Percent Paced: 99.95 %
Date Time Interrogation Session: 20221121110442
Implantable Lead Implant Date: 20180124
Implantable Lead Implant Date: 20180124
Implantable Lead Location: 753859
Implantable Lead Location: 753860
Implantable Lead Model: 3830
Implantable Lead Model: 5076
Implantable Pulse Generator Implant Date: 20180124
Lead Channel Impedance Value: 342 Ohm
Lead Channel Impedance Value: 361 Ohm
Lead Channel Impedance Value: 418 Ohm
Lead Channel Impedance Value: 532 Ohm
Lead Channel Pacing Threshold Amplitude: 0.5 V
Lead Channel Pacing Threshold Amplitude: 0.75 V
Lead Channel Pacing Threshold Pulse Width: 0.4 ms
Lead Channel Pacing Threshold Pulse Width: 0.4 ms
Lead Channel Sensing Intrinsic Amplitude: 0.75 mV
Lead Channel Sensing Intrinsic Amplitude: 0.75 mV
Lead Channel Sensing Intrinsic Amplitude: 3.75 mV
Lead Channel Sensing Intrinsic Amplitude: 3.75 mV
Lead Channel Setting Pacing Amplitude: 2 V
Lead Channel Setting Pacing Amplitude: 2.25 V
Lead Channel Setting Pacing Pulse Width: 1 ms
Lead Channel Setting Sensing Sensitivity: 0.6 mV

## 2020-12-17 ENCOUNTER — Other Ambulatory Visit: Payer: Self-pay | Admitting: Nurse Practitioner

## 2020-12-18 NOTE — Telephone Encounter (Signed)
Requested Prescriptions  Pending Prescriptions Disp Refills  . gabapentin (NEURONTIN) 600 MG tablet [Pharmacy Med Name: GABAPENTIN 600 MG TABLET] 90 tablet 0    Sig: TAKE 1 TABLET BY MOUTH AT BEDTIME.     Neurology: Anticonvulsants - gabapentin Passed - 12/17/2020 11:41 AM      Passed - Valid encounter within last 12 months    Recent Outpatient Visits          1 month ago Morbid obesity ([place])   [place], [name], NP   1 month ago Need for influenza vaccination   [place], Lauren A, NP   2 months ago Right lower quadrant abdominal pain   [place], [place] T, NP   4 months ago Depression, recurrent ([place])   [place], [name], NP   4 months ago Cough   [place], Barbaraann Faster, NP      Future Appointments            In 3 days Cannady, Barbaraann Faster, NP [facility], [facility]   In 2 weeks [place], Eber Hong, MD [place], [facility]   In 4 months  [employer], [place]

## 2020-12-21 ENCOUNTER — Other Ambulatory Visit: Payer: Self-pay

## 2020-12-21 ENCOUNTER — Ambulatory Visit (INDEPENDENT_AMBULATORY_CARE_PROVIDER_SITE_OTHER): Payer: Medicare Other | Admitting: Nurse Practitioner

## 2020-12-21 ENCOUNTER — Encounter: Payer: Self-pay | Admitting: Nurse Practitioner

## 2020-12-21 DIAGNOSIS — N400 Enlarged prostate without lower urinary tract symptoms: Secondary | ICD-10-CM | POA: Diagnosis not present

## 2020-12-21 DIAGNOSIS — G8929 Other chronic pain: Secondary | ICD-10-CM | POA: Diagnosis not present

## 2020-12-21 DIAGNOSIS — R1031 Right lower quadrant pain: Secondary | ICD-10-CM

## 2020-12-21 DIAGNOSIS — R7301 Impaired fasting glucose: Secondary | ICD-10-CM | POA: Diagnosis not present

## 2020-12-21 MED ORDER — TIZANIDINE HCL 4 MG PO TABS: 4.0000 mg | ORAL_TABLET | Freq: Four times a day (QID) | ORAL | 4 refills | Status: AC | PRN

## 2020-12-21 NOTE — Assessment & Plan Note (Signed)
Chronic, stable with no current symptoms.  Recent PSA remained elevated, has had some elevations but in normal range for age and ethnicity -- suspect more related to BPH.  Advised him to return to urology as he was to follow-up with them 4 weeks after recent visit and did not.

## 2020-12-21 NOTE — Assessment & Plan Note (Signed)
Work-up by GI negative for acute findings and labs overall have remained stable.  Suspect more musculoskeletal in nature.  At this time will send in Tizanidine and trial this + recommend he speak to Dr. Andree Elk about injection since pain appears to radiate from right lower back + speak to Dr. Sabra Heck about PT at [facility], which may offer benefit to pain.

## 2020-12-21 NOTE — Assessment & Plan Note (Signed)
Noted on past labs -- A1c today downward trend last visit to 6% and urine ALB 150.  Concern for T2DM and discussed with him at length options at this time, he wishes to focus on diet vs adding medication at this time.  Recheck A1c next visit.

## 2020-12-21 NOTE — Patient Instructions (Signed)
Chronic Back Pain When back pain lasts longer than 3 months, it is called chronic back pain. Pain may get worse at certain times (flare-ups). There are things you can do at home to manage your pain. Follow these instructions at home: Pay attention to any changes in your symptoms. Take these actions to help with your pain: Managing pain and stiffness   If told, put ice on the painful area. Your doctor may tell you to use ice for 24-48 hours after the flare-up starts. To do this: Put ice in a plastic bag. Place a towel between your skin and the bag. Leave the ice on for 20 minutes, 2-3 times a day. If told, put heat on the painful area. Do this as often as told by your doctor. Use the heat source that your doctor recommends, such as a moist heat pack or a heating pad. Place a towel between your skin and the heat source. Leave the heat on for 20-30 minutes. Take off the heat if your skin turns bright red. This is especially important if you are unable to feel pain, heat, or cold. You may have a greater risk of getting burned. Soak in a warm bath. This can help relieve pain. Activity  Avoid bending and other activities that make pain worse. When standing: Keep your upper back and neck straight. Keep your shoulders pulled back. Avoid slouching. When sitting: Keep your back straight. Relax your shoulders. Do not round your shoulders or pull them backward. Do not sit or stand in one place for long periods of time. Take short rest breaks during the day. Lying down or standing is usually better than sitting. Resting can help relieve pain. When sitting or lying down for a long time, do some mild activity or stretching. This will help to prevent stiffness and pain. Get regular exercise. Ask your doctor what activities are safe for you. Do not lift anything that is heavier than 10 lb (4.5 kg) or the limit that you are told, until your doctor says that it is safe. To prevent injury when you lift  things: Bend your knees. Keep the weight close to your body. Avoid twisting. Sleep on a firm mattress. Try lying on your side with your knees slightly bent. If you lie on your back, put a pillow under your knees. Medicines Treatment may include medicines for pain and swelling taken by mouth or put on the skin, prescription pain medicine, or muscle relaxants. Take over-the-counter and prescription medicines only as told by your doctor. Ask your doctor if the medicine prescribed to you: Requires you to avoid driving or using machinery. Can cause trouble pooping (constipation). You may need to take these actions to prevent or treat trouble pooping: Drink enough fluid to keep your pee (urine) pale yellow. Take over-the-counter or prescription medicines. Eat foods that are high in fiber. These include beans, whole grains, and fresh fruits and vegetables. Limit foods that are high in fat and sugars. These include fried or sweet foods. General instructions Do not use any products that contain nicotine or tobacco, such as cigarettes, e-cigarettes, and chewing tobacco. If you need help quitting, ask your doctor. Keep all follow-up visits as told by your doctor. This is important. Contact a doctor if: Your pain does not get better with rest or medicine. Your pain gets worse, or you have new pain. You have a high fever. You lose weight very quickly. You have trouble doing your normal activities. Get help right away if: One   or both of your legs or feet feel weak. One or both of your legs or feet lose feeling (have numbness). You have trouble controlling when you poop (have a bowel movement) or pee (urinate). You have bad back pain and: You feel like you may vomit (nauseous), or you vomit. You have pain in your belly (abdomen). You have shortness of breath. You faint. Summary When back pain lasts longer than 3 months, it is called chronic back pain. Pain may get worse at certain times  (flare-ups). Use ice and heat as told by your doctor. Your doctor may tell you to use ice after flare-ups. This information is not intended to replace advice given to you by your health care provider. Make sure you discuss any questions you have with your health care provider. Document Revised: 02/19/2019 Document Reviewed: 02/19/2019 Elsevier Patient Education  2022 [employer].  

## 2020-12-21 NOTE — Progress Notes (Signed)
BP 120/84 (BP Location: Left Arm)   Pulse 69   Temp 98 F (36.7 C) (Oral)   Wt 282 lb 12.8 oz (128.3 kg)   SpO2 97%   BMI 40.58 kg/m    Subjective:    Patient ID: [name], male    DOB: [birth year], 75 y.o.   MRN: [id number]  HPI: Nicholas [name] is a 75 y.o. male  Chief Complaint  Patient presents with   Abdominal Pain    Patient is here for a follow up on RLQ pain. Patient states he is still experiencing the same pain in his RLQ. Patient states the more activity he does the more the area hurts and is painful.    Wife present at bedside with patient.  ABDOMINAL PAIN (RLQ) Having ongoing issues with this -- CT in August noted possible stone in right kidney -- he saw Dr. Caryl Pina with urology on 10/05/20 and stone was not suspected.  Renal ultrasound done which was normal.  He saw general surgery on 11/04/20 and he is to attend PT + plus they recommended he visit GI, which he did and had a recent colonoscopy -- he cleaned for two days, but still was not clean enough per patient report.  Reports showed no acute findings and she recommended Miralax or Linzess -- he does have BM every other day and texture is softer, no straining.  He does take Miralax daily.    He reports Dr. Sabra Heck told him it is not the right hip causing issues.  Continues Protonix for GERD.  His pain provider recommend trying a shot, Dr. Andree Elk.  Pain at times radiates around to back. Duration:months Onset: gradual Severity: 7/10 at worst -- if moves getting out of car or in car, rolling in bed make it worse -- hurt when he got clubs into car Quality: sharp, aching, and throbbing Location:  RLQ  Episode duration:  Radiation: no Frequency: intermittent Alleviating factors:  Aggravating factors: Status: worse Treatments attempted:  ice, heat, TENS, and opioids Fever: no Nausea: no Vomiting: no Weight loss: no Decreased appetite: no Diarrhea: no Constipation: no Blood in stool: no Heartburn: no Jaundice:  no Rash: no Dysuria/urinary frequency: no Hematuria: no History of sexually transmitted disease: no Recurrent NSAID use: no   IFG: Recent A1c 6% in October. Polydipsia/polyuria: no Visual disturbance: no Chest pain: no Paresthesias: no   ELEVATED PSA Recent PSA on labs have crept up some.  Dr. Erlene Quan has seen with urology on 10/05/20 -- was to return in 4 weeks.  Is on Vesicare. BPH status: stable Satisfied with current treatment?: yes Nocturia: no Urinary frequency:no Incomplete voiding: no Urgency: yes Weak urinary stream: no Straining to start stream: no Dysuria: no Onset: gradual Severity: mild Alleviating factors: Vesicare Aggravating factors: unknown Treatments attempted: as above IPSS Questionnaire (AUA-7): 2 Over the past month.   1)  How often have you had a sensation of not emptying your bladder completely after you finish urinating?  0 - Not at all  2)  How often have you had to urinate again less than two hours after you finished urinating? 1 - Less than 1 time in 5  3)  How often have you found you stopped and started again several times when you urinated?  0 - Not at all  4) How difficult have you found it to postpone urination?  0 - Not at all  5) How often have you had a weak urinary stream?  1 - Less  than 1 time in 5  6) How often have you had to push or strain to begin urination?  0 - Not at all  7) How many times did you most typically get up to urinate from the time you went to bed until the time you got up in the morning?  0 - None  Total score:  0-7 mildly symptomatic   8-19 moderately symptomatic   20-35 severely symptomatic     Relevant past medical, surgical, family and social history reviewed and updated as indicated. Interim medical history since our last visit reviewed. Allergies and medications reviewed and updated.  Review of Systems  Constitutional:  Negative for activity change, appetite change, fatigue and fever.  Respiratory:   Negative for cough, chest tightness, shortness of breath and wheezing.   Cardiovascular:  Negative for chest pain, palpitations and leg swelling.  Endocrine: Negative for polydipsia, polyphagia and polyuria.  Musculoskeletal:  Positive for arthralgias.  Neurological: Negative.   Psychiatric/Behavioral:  Negative for decreased concentration, self-injury, sleep disturbance and suicidal ideas. The patient is not nervous/anxious.    Per HPI unless specifically indicated above     Objective:    BP 120/84 (BP Location: Left Arm)   Pulse 69   Temp 98 F (36.7 C) (Oral)   Wt 282 lb 12.8 oz (128.3 kg)   SpO2 97%   BMI 40.58 kg/m   Wt Readings from Last 3 Encounters:  12/21/20 282 lb 12.8 oz (128.3 kg)  12/06/20 279 lb (126.6 kg)  11/26/20 279 lb (126.6 kg)    Physical Exam Vitals and nursing note reviewed.  Constitutional:      General: He is awake. He is not in acute distress.    Appearance: He is well-developed and well-groomed. He is obese.  HENT:     Head: Normocephalic and atraumatic.     Right Ear: Hearing normal. No drainage.     Left Ear: Hearing normal. No drainage.  Eyes:     General: Lids are normal.        Right eye: No discharge.        Left eye: No discharge.     Conjunctiva/sclera: Conjunctivae normal.     Pupils: Pupils are equal, round, and reactive to light.  Neck:     Thyroid: No thyromegaly.     Vascular: No carotid bruit.  Cardiovascular:     Rate and Rhythm: Normal rate and regular rhythm.     Heart sounds: Normal heart sounds, S1 normal and S2 normal. No murmur heard.   No gallop.  Pulmonary:     Effort: Pulmonary effort is normal. No accessory muscle usage or respiratory distress.     Breath sounds: Normal breath sounds.  Abdominal:     General: Bowel sounds are normal. There is no distension.     Palpations: Abdomen is soft. There is no hepatomegaly.     Tenderness: There is abdominal tenderness in the right lower quadrant. There is no right CVA  tenderness, left CVA tenderness, guarding or rebound. Negative signs include psoas sign and obturator sign.  Musculoskeletal:        General: Normal range of motion.     Cervical back: Normal range of motion and neck supple.     Right lower leg: No edema.     Left lower leg: No edema.  Skin:    General: Skin is warm and dry.  Neurological:     Mental Status: He is alert and oriented to person, place, and  time.  Psychiatric:        Attention and Perception: Attention normal.        Mood and Affect: Mood normal.        Behavior: Behavior normal. Behavior is cooperative.        Thought Content: Thought content normal.    Results for orders placed or performed in visit on 12/13/20  [facility] [place] CHECK  Result Value Ref Range   Date Time Interrogation Session [phone]    Pulse Generator Manufacturer MERM    Pulse Gen Model A2DR01 Advisa DR MRI    Pulse Gen Serial Number [id number] [place] [facility]    Implantable Pulse Generator Type Implantable Pulse Generator    Implantable Pulse Generator Implant Date [id number]    Implantable Lead Manufacturer MERM    Implantable Lead Model 3830 SelectSecure MRI SureScan    Implantable Lead Serial Number [id number]    Implantable Lead Implant Date [id number]    Implantable Lead Location Detail 1 UNKNOWN    Implantable Lead Special Function Bundle of His    Implantable Lead Location [id number]    Implantable Lead Manufacturer MERM    Implantable Lead Model 5076 CapSureFix Novus MRI SureScan    Implantable Lead Serial Number [id number]    Implantable Lead Implant Date [id number]    Implantable Lead Location Detail 1 APPENDAGE    Implantable Lead Location [id number]    Lead Channel Setting Sensing Sensitivity 0.6 mV   Lead Channel Setting Pacing Amplitude 2 V   Lead Channel Setting Pacing Pulse Width 1 ms   Lead Channel Setting Pacing Amplitude 2.25 V   Lead Channel Impedance Value 418 ohm   Lead Channel Impedance Value 361  ohm   Lead Channel Sensing Intrinsic Amplitude 3.75 mV   Lead Channel Sensing Intrinsic Amplitude 3.75 mV   Lead Channel Pacing Threshold Amplitude 0.5 V   Lead Channel Pacing Threshold Pulse Width 0.4 ms   Lead Channel Impedance Value 532 ohm   Lead Channel Impedance Value 342 ohm   Lead Channel Sensing Intrinsic Amplitude 0.75 mV   Lead Channel Sensing Intrinsic Amplitude 0.75 mV   Lead Channel Pacing Threshold Amplitude 0.75 V   Lead Channel Pacing Threshold Pulse Width 0.4 ms   Battery Status OK    Battery Remaining Longevity 33 mo   Battery Voltage 2.97 V   Brady Statistic RA Percent Paced 40.42 %   Brady Statistic RV Percent Paced 99.95 %   Brady Statistic AP VP Percent 40.5 %   Brady Statistic AS VP Percent 59.45 %   Brady Statistic AP VS Percent 0.01 %   Brady Statistic AS VS Percent 0.04 %      Assessment & Plan:   Problem List Items Addressed This Visit       Endocrine   IFG (impaired fasting glucose) (Chronic)    Noted on past labs -- A1c today downward trend last visit to 6% and urine ALB 150.  Concern for T2DM and discussed with him at length options at this time, he wishes to focus on diet vs adding medication at this time.  Recheck A1c next visit.        Genitourinary   BPH (benign prostatic hyperplasia)    Chronic, stable with no current symptoms.  Recent PSA remained elevated, has had some elevations but in normal range for age and ethnicity -- suspect more related to BPH.  Advised him to return to urology as he was to follow-up  with them 4 weeks after recent visit and did not.        Other   Abdominal pain, chronic, right lower quadrant    Work-up by GI negative for acute findings and labs overall have remained stable.  Suspect more musculoskeletal in nature.  At this time will send in Tizanidine and trial this + recommend he speak to Dr. Andree Elk about injection since pain appears to radiate from right lower back + speak to Dr. Sabra Heck about PT at [facility],  which may offer benefit to pain.        Relevant Medications   tiZANidine (ZANAFLEX) 4 MG tablet     Follow up plan: Return in about 2 months (around 02/20/2021) for HTN/HLD, PSA CHECK, A1c, MOOD.

## 2020-12-22 NOTE — Progress Notes (Signed)
Remote pacemaker transmission.   

## 2020-12-23 ENCOUNTER — Other Ambulatory Visit: Payer: Self-pay | Admitting: Nurse Practitioner

## 2020-12-23 DIAGNOSIS — I10 Essential (primary) hypertension: Secondary | ICD-10-CM

## 2020-12-25 NOTE — Telephone Encounter (Signed)
Requested medication (s) are due for refill today: no  Requested medication (s) are on the active medication list: yes  Last refill:  12/05/20 for 90 day supply  Future visit scheduled: 02/21/21  Notes to clinic:  requesting early, please assess.   Requested Prescriptions  Pending Prescriptions Disp Refills   solifenacin (VESICARE) 10 MG tablet [Pharmacy Med Name: SOLIFENACIN 10 MG TABLET] 90 tablet 1    Sig: TAKE 1 TABLET BY [place]     Urology:  Bladder Agents Passed - 12/23/2020  7:23 PM      Passed - Valid encounter within last 12 months    Recent Outpatient Visits           4 days ago Abdominal pain, chronic, right lower quadrant   [place], [name], NP   1 month ago Morbid obesity ([place])   [place], [name], NP   2 months ago Need for influenza vaccination   [place], Lauren A, NP   3 months ago Right lower quadrant abdominal pain   [place], Vansant T, NP   4 months ago Depression, recurrent ([place])   [place], Barbaraann Faster, NP       Future Appointments             In 1 week Turner, Eber Hong, MD [place], [facility]   In 1 month Brantley, [place] T, NP [employer], [place]   In 4 months  [employer], [place]

## 2020-12-28 ENCOUNTER — Telehealth: Payer: Self-pay

## 2020-12-28 NOTE — Telephone Encounter (Signed)
I scheduled him the [place] for 12/21 and he has questions about stopping his eliquis. Please call.

## 2020-12-28 NOTE — Telephone Encounter (Signed)
Patient instructed to stop Eliquis 3 days before procedure.

## 2021-01-06 ENCOUNTER — Telehealth: Payer: Medicare Other | Admitting: Cardiology

## 2021-01-12 ENCOUNTER — Other Ambulatory Visit: Payer: Self-pay

## 2021-01-12 ENCOUNTER — Encounter: Payer: Self-pay | Admitting: Anesthesiology

## 2021-01-12 ENCOUNTER — Other Ambulatory Visit: Payer: Self-pay | Admitting: Anesthesiology

## 2021-01-12 ENCOUNTER — Ambulatory Visit (HOSPITAL_BASED_OUTPATIENT_CLINIC_OR_DEPARTMENT_OTHER): Payer: Medicare Other | Admitting: Anesthesiology

## 2021-01-12 ENCOUNTER — Ambulatory Visit
Admission: RE | Admit: 2021-01-12 | Discharge: 2021-01-12 | Disposition: A | Discharge status: Home / Self Care | Payer: Medicare Other | Source: Ambulatory Visit | Attending: Anesthesiology | Admitting: Anesthesiology

## 2021-01-12 ENCOUNTER — Ambulatory Visit: Payer: Medicare Other | Admitting: Anesthesiology

## 2021-01-12 VITALS — BP 162/87 | HR 77 | Temp 97.0°F | Resp 20 | Ht 71.0 in | Wt 282.0 lb

## 2021-01-12 DIAGNOSIS — M5416 Radiculopathy, lumbar region: Secondary | ICD-10-CM | POA: Diagnosis present

## 2021-01-12 DIAGNOSIS — M5431 Sciatica, right side: Secondary | ICD-10-CM | POA: Insufficient documentation

## 2021-01-12 DIAGNOSIS — R52 Pain, unspecified: Secondary | ICD-10-CM | POA: Diagnosis present

## 2021-01-12 DIAGNOSIS — G8929 Other chronic pain: Secondary | ICD-10-CM | POA: Insufficient documentation

## 2021-01-12 DIAGNOSIS — M5432 Sciatica, left side: Secondary | ICD-10-CM | POA: Diagnosis present

## 2021-01-12 DIAGNOSIS — F1190 Opioid use, unspecified, uncomplicated: Secondary | ICD-10-CM | POA: Diagnosis not present

## 2021-01-12 DIAGNOSIS — M79642 Pain in left hand: Secondary | ICD-10-CM | POA: Diagnosis present

## 2021-01-12 DIAGNOSIS — M51369 Other intervertebral disc degeneration, lumbar region without mention of lumbar back pain or lower extremity pain: Secondary | ICD-10-CM

## 2021-01-12 DIAGNOSIS — M79641 Pain in right hand: Secondary | ICD-10-CM

## 2021-01-12 DIAGNOSIS — R1031 Right lower quadrant pain: Secondary | ICD-10-CM | POA: Insufficient documentation

## 2021-01-12 DIAGNOSIS — G894 Chronic pain syndrome: Secondary | ICD-10-CM | POA: Insufficient documentation

## 2021-01-12 DIAGNOSIS — M47816 Spondylosis without myelopathy or radiculopathy, lumbar region: Secondary | ICD-10-CM | POA: Diagnosis present

## 2021-01-12 DIAGNOSIS — M5136 Other intervertebral disc degeneration, lumbar region: Secondary | ICD-10-CM | POA: Insufficient documentation

## 2021-01-12 MED ORDER — SODIUM CHLORIDE (PF) 0.9 % IJ SOLN
INTRAMUSCULAR | Status: AC
  Filled 2021-01-12: qty 10

## 2021-01-12 MED ORDER — SODIUM CHLORIDE 0.9% FLUSH
10.0000 mL | Freq: Once | INTRAVENOUS | Status: AC
  Administered 2021-01-12: 11:00:00 5 mL

## 2021-01-12 MED ORDER — ROPIVACAINE HCL 2 MG/ML IJ SOLN
INTRAMUSCULAR | Status: AC
  Filled 2021-01-12: qty 20

## 2021-01-12 MED ORDER — LIDOCAINE HCL (PF) 1 % IJ SOLN
5.0000 mL | Freq: Once | INTRAMUSCULAR | Status: AC
  Administered 2021-01-12: 11:00:00 5 mL via SUBCUTANEOUS
  Filled 2021-01-12: qty 5

## 2021-01-12 MED ORDER — IOHEXOL 180 MG/ML  SOLN
10.0000 mL | Freq: Once | INTRAMUSCULAR | Status: AC | PRN
  Administered 2021-01-12: 10:00:00 5 mL via EPIDURAL

## 2021-01-12 MED ORDER — TRIAMCINOLONE ACETONIDE 40 MG/ML IJ SUSP
40.0000 mg | Freq: Once | INTRAMUSCULAR | Status: AC
  Administered 2021-01-12: 11:00:00 40 mg
  Filled 2021-01-12: qty 1

## 2021-01-12 MED ORDER — ROPIVACAINE HCL 2 MG/ML IJ SOLN
10.0000 mL | Freq: Once | INTRAMUSCULAR | Status: AC
  Administered 2021-01-12: 11:00:00 1 mL via EPIDURAL

## 2021-01-12 NOTE — Patient Instructions (Signed)

## 2021-01-12 NOTE — Progress Notes (Signed)
Safety precautions to be maintained throughout the outpatient stay will include: orient to surroundings, keep bed in low position, maintain call bell within reach at all times, provide assistance with transfer out of bed and ambulation.  

## 2021-01-13 ENCOUNTER — Telehealth: Payer: Self-pay

## 2021-01-13 ENCOUNTER — Ambulatory Visit: Payer: Medicare Other | Admitting: Anesthesiology

## 2021-01-13 ENCOUNTER — Telehealth: Payer: Self-pay | Admitting: *Deleted

## 2021-01-13 NOTE — Telephone Encounter (Signed)
Anderson Malta texted Dr. Andree Elk requesting order for LESI

## 2021-01-13 NOTE — Telephone Encounter (Signed)
Dr. Andree Elk stated in check out comments to bring this patient back for a LESI next month but there is no order.

## 2021-01-13 NOTE — Telephone Encounter (Signed)
Post procedure call;  no questions or concerns.  Patient did ask about his next appt.  Blanch Media will call him d/t needing a PA prior to scheduling procedure.

## 2021-01-14 NOTE — Progress Notes (Signed)
Subjective:  Patient ID: Nicholas [name], male    DOB: 04-18-1945  Age: 75 y.o. MRN: [id number]  CC: Abdominal Pain (Low right)   Procedure: L5-S1 epidural steroid under fluoroscopic guidance with no sedation  HPI Nicholas [name] presents for reevaluation.  Nicholas [name] continues to have lower extremity pain with bilateral lower extremity sciatica symptoms.  In the past has had epidural steroids and help with this.  Unfortunately the pain has been quite severe and incapacitating to the point where he is no longer playing golf or actively ambulating.  The quality characteristic and distribution of his pain are stable in nature.  His last epidural was in the distant past and he reports 70% improvement in his low back pain and near complete relief of his lower extremity pain.  This last epidural was in the distant past and the quality characteristic and distribution of this pain is comparable to what he experiences before.  No change in lower extremity strength or function of bowel or bladder function is noted at this time.  Despite opioid medications and conservative care he has failed to gain significant improvement.  He has been doing his home exercises with physical therapy modalities but without success.  Outpatient Medications Prior to Visit  Medication Sig Dispense Refill   acetaminophen (TYLENOL) 650 MG CR tablet Take 650 mg by mouth every 8 (eight) hours as needed for pain.     apixaban (ELIQUIS) 5 MG TABS tablet Take 1 tablet (5 mg total) by mouth 2 (two) times daily. 180 tablet 1   Ascorbic Acid (VITAMIN C) 1000 MG tablet Take 1,000 mg by mouth daily.     Cranberry 500 MG CAPS Take 500 mg by mouth daily.     gabapentin (NEURONTIN) 600 MG tablet TAKE 1 TABLET BY MOUTH AT BEDTIME. 90 tablet 0   HYDROcodone-acetaminophen (NORCO/VICODIN) 5-325 MG tablet Take 1 tablet by mouth in the morning and at bedtime. 60 tablet 0   [START ON 01/22/2021] HYDROcodone-acetaminophen (NORCO/VICODIN) 5-325 MG tablet Take  1 tablet by mouth 2 (two) times daily. 60 tablet 0   LORazepam (ATIVAN) 1 MG tablet Take 1 tablet (1 mg total) by mouth at bedtime. 90 tablet 0   Magnesium 400 MG TABS Take 400 mg by mouth daily.      MELATONIN PO Take by mouth.     midodrine (PROAMATINE) 10 MG tablet TAKE 1 TABLET BY MOUTH THREE TIMES A DAY 270 tablet 3   Multiple Vitamin (MULTIVITAMIN WITH MINERALS) TABS tablet Take 1 tablet by mouth daily. Centrum Silver     Omega-3 Fatty Acids (FISH OIL) 1200 MG CAPS Take 1,200 mg by mouth daily.     oxymetazoline (AFRIN) 0.05 % nasal spray Place 2 sprays into both nostrils at bedtime.     pantoprazole (PROTONIX) 20 MG tablet TAKE 2 TABLETS (40 MG TOTAL) BY MOUTH DAILY. (Patient taking differently: Take 20 mg by mouth daily.) 90 tablet 4   potassium chloride SA (KLOR-CON) 20 MEQ tablet Take 2 tablets (40 meq) x 1 dose as directed (Patient taking differently: Take 40 mEq by mouth daily as needed (with torsemide (fluid retention)).) 10 tablet 0   QUEtiapine Fumarate (SEROQUEL XR) 150 MG 24 hr tablet TAKE 1 TABLET (150 MG TOTAL) BY MOUTH AT BEDTIME. 90 tablet 4   rOPINIRole (REQUIP) 0.25 MG tablet Take 0.25 mg by mouth 4 (four) times daily.     rosuvastatin (CRESTOR) 40 MG tablet Take 1 tablet (40 mg total) by mouth daily. [place]  tablet 4   solifenacin (VESICARE) 10 MG tablet TAKE 1 TABLET BY MOUTH EVERY DAY 90 tablet 1   tiZANidine (ZANAFLEX) 4 MG tablet Take 1 tablet (4 mg total) by mouth every 6 (six) hours as needed for muscle spasms. 90 tablet 4   torsemide (DEMADEX) 20 MG tablet Take 40 mg by mouth daily as needed (fluid retention).     venlafaxine XR (EFFEXOR XR) 75 MG 24 hr capsule Take 3 capsules (225 mg total) by mouth daily. 270 capsule 4   No facility-administered medications prior to visit.    Review of Systems CNS: No confusion or sedation Cardiac: No angina or palpitations GI: No abdominal pain or constipation Constitutional: No nausea vomiting fevers or chills  Objective:  BP  (!) 162/87    Pulse 77    Temp (!) 97 F (36.1 C)    Resp 20    Ht 5\' 11"  (1.803 m)    Wt 282 lb (127.9 kg)    SpO2 95%    BMI 39.33 kg/m    BP Readings from Last 3 Encounters:  01/12/21 (!) 162/87  12/21/20 120/84  11/26/20 (!) 140/52     Wt Readings from Last 3 Encounters:  01/12/21 282 lb (127.9 kg)  12/21/20 282 lb 12.8 oz (128.3 kg)  12/06/20 279 lb (126.6 kg)     Physical Exam Pt is alert and oriented PERRL EOMI HEART IS RRR no murmur or rub LCTA no wheezing or rales MUSCULOSKELETAL reveals some paraspinous muscle tenderness in the lumbar region but no overt trigger points.  His muscle tone and bulk to the lower extremities is at baseline.  He walks with an antalgic gait.  Labs  Lab Results  Component Value Date   HGBA1C 6.0 (H) 11/16/2020   HGBA1C 6.5 08/11/2020   HGBA1C 5.9 02/11/2020   Lab Results  Component Value Date   MICROALBUR 150 (H) 08/11/2020   LDLCALC 65 08/11/2020   CREATININE 1.03 11/16/2020    -------------------------------------------------------------------------------------------------------------------- Lab Results  Component Value Date   WBC 5.7 11/16/2020   HGB 15.3 11/16/2020   HCT 46.4 11/16/2020   PLT 170 11/16/2020   GLUCOSE 125 (H) 11/16/2020   CHOL 140 08/11/2020   TRIG 118 08/11/2020   HDL 54 08/11/2020   LDLCALC 65 08/11/2020   ALT 20 11/16/2020   AST 31 11/16/2020   NA 140 11/16/2020   K 4.2 11/16/2020   CL 99 11/16/2020   CREATININE 1.03 11/16/2020   BUN 8 11/16/2020   CO2 25 11/16/2020   TSH 2.180 08/11/2020   INR 1.2 02/16/2014   HGBA1C 6.0 (H) 11/16/2020   MICROALBUR 150 (H) 08/11/2020    --------------------------------------------------------------------------------------------------------------------- DG PAIN CLINIC C-ARM 1-60 MIN NO REPORT  Result Date: 01/12/2021 Fluoro was used, but no Radiologist interpretation will be provided. Please refer to "NOTES" tab for provider progress  note.    Assessment & Plan:   Nicholas [name] was seen today for abdominal pain.  Diagnoses and all orders for this visit:  Chronic, continuous use of opioids  Lumbar radiculopathy -     Lumbar Epidural Injection -     Lumbar Epidural Injection; Future  Degeneration of lumbar intervertebral disc -     Lumbar Epidural Injection -     Lumbar Epidural Injection; Future  Bilateral sciatica -     Lumbar Epidural Injection -     Lumbar Epidural Injection; Future  Abdominal pain, chronic, right lower quadrant -     Lumbar Epidural Injection  Chronic  pain syndrome  Bilateral hand pain  Facet syndrome, lumbar  Other orders -     triamcinolone acetonide (KENALOG-40) injection 40 mg -     sodium chloride flush (NS) 0.9 % injection 10 mL -     ropivacaine (PF) 2 mg/mL (0.2%) (NAROPIN) injection 10 mL -     lidocaine (PF) (XYLOCAINE) 1 % injection 5 mL -     iohexol (OMNIPAQUE) 180 MG/ML injection 10 mL        ----------------------------------------------------------------------------------------------------------------------  Problem List Items Addressed This Visit       Unprioritized   Chronic pain syndrome (Chronic)   Abdominal pain, chronic, right lower quadrant   Bilateral hand pain   Chronic, continuous use of opioids - Primary   Facet syndrome, lumbar   Lumbar radiculopathy   Relevant Orders   Lumbar Epidural Injection   Other Visit Diagnoses     Degeneration of lumbar intervertebral disc       Relevant Medications   triamcinolone acetonide (KENALOG-40) injection 40 mg (Completed)   Other Relevant Orders   Lumbar Epidural Injection   Bilateral sciatica       Relevant Orders   Lumbar Epidural Injection         ----------------------------------------------------------------------------------------------------------------------  1. Lumbar radiculopathy We will proceed with a repeat epidural today.  We gone over the risks and benefits of this with him in full  detail all questions were answered.  I have reviewed his 2006 MRI with him.  The quality of the pain seems to be comfortable with that however repeat imaging may be indicated.  This will be contingent on his response to epidural steroid injections.  No new change in lower extremity strength is noted. - Lumbar Epidural Injection - Lumbar Epidural Injection; Future  2. Degeneration of lumbar intervertebral disc As above - Lumbar Epidural Injection - Lumbar Epidural Injection; Future  3. Bilateral sciatica As above.  - Lumbar Epidural Injection - Lumbar Epidural Injection; Future  4. Abdominal pain, chronic, right lower quadrant He is having a considerable amount of right lower quadrant pain following an extensive work-up with CT and GI input with no foreseeable cause.  Of note he did have a right hip pain and right leg pain with his previous sciatica exacerbation which did improve following the epidural and hopefully this pain will respond to today's injection. - Lumbar Epidural Injection  5. Chronic, continuous use of opioids I have gone over the [facility] practitioner database information is appropriate for refills for his hydrocodone.  6. Chronic pain syndrome As above  7. Bilateral hand pain   8. Facet syndrome, lumbar     ----------------------------------------------------------------------------------------------------------------------  I am having Nicholas T. Lonia Skinner "Tommy" maintain his oxymetazoline, Fish Oil, Cranberry, Magnesium, acetaminophen, potassium chloride SA, rosuvastatin, torsemide, QUEtiapine Fumarate, rOPINIRole, vitamin C, multivitamin with minerals, midodrine, LORazepam, venlafaxine XR, pantoprazole, solifenacin, apixaban, MELATONIN PO, HYDROcodone-acetaminophen, HYDROcodone-acetaminophen, gabapentin, and tiZANidine. We administered triamcinolone acetonide, sodium chloride flush, ropivacaine (PF) 2 mg/mL (0.2%), lidocaine (PF), and iohexol.   Meds ordered  this encounter  Medications   triamcinolone acetonide (KENALOG-40) injection 40 mg   sodium chloride flush (NS) 0.9 % injection 10 mL   ropivacaine (PF) 2 mg/mL (0.2%) (NAROPIN) injection 10 mL   lidocaine (PF) (XYLOCAINE) 1 % injection 5 mL   iohexol (OMNIPAQUE) 180 MG/ML injection 10 mL   Patient's Medications  New Prescriptions   No medications on file  Previous Medications   ACETAMINOPHEN (TYLENOL) 650 MG CR TABLET    Take 650 mg  by mouth every 8 (eight) hours as needed for pain.   APIXABAN (ELIQUIS) 5 MG TABS TABLET    Take 1 tablet (5 mg total) by mouth 2 (two) times daily.   ASCORBIC ACID (VITAMIN C) 1000 MG TABLET    Take 1,000 mg by mouth daily.   CRANBERRY 500 MG CAPS    Take 500 mg by mouth daily.   GABAPENTIN (NEURONTIN) 600 MG TABLET    TAKE 1 TABLET BY MOUTH AT BEDTIME.   HYDROCODONE-ACETAMINOPHEN (NORCO/VICODIN) 5-325 MG TABLET    Take 1 tablet by mouth in the morning and at bedtime.   HYDROCODONE-ACETAMINOPHEN (NORCO/VICODIN) 5-325 MG TABLET    Take 1 tablet by mouth 2 (two) times daily.   LORAZEPAM (ATIVAN) 1 MG TABLET    Take 1 tablet (1 mg total) by mouth at bedtime.   MAGNESIUM 400 MG TABS    Take 400 mg by mouth daily.    MELATONIN PO    Take by mouth.   MIDODRINE (PROAMATINE) 10 MG TABLET    TAKE 1 TABLET BY MOUTH THREE TIMES A DAY   MULTIPLE VITAMIN (MULTIVITAMIN WITH MINERALS) TABS TABLET    Take 1 tablet by mouth daily. Centrum Silver   OMEGA-3 FATTY ACIDS (FISH OIL) 1200 MG CAPS    Take 1,200 mg by mouth daily.   OXYMETAZOLINE (AFRIN) 0.05 % NASAL SPRAY    Place 2 sprays into both nostrils at bedtime.   PANTOPRAZOLE (PROTONIX) 20 MG TABLET    TAKE 2 TABLETS (40 MG TOTAL) BY MOUTH DAILY.   POTASSIUM CHLORIDE SA (KLOR-CON) 20 MEQ TABLET    Take 2 tablets (40 meq) x 1 dose as directed   QUETIAPINE FUMARATE (SEROQUEL XR) 150 MG 24 HR TABLET    TAKE 1 TABLET (150 MG TOTAL) BY MOUTH AT BEDTIME.   ROPINIROLE (REQUIP) 0.25 MG TABLET    Take 0.25 mg by mouth 4 (four)  times daily.   ROSUVASTATIN (CRESTOR) 40 MG TABLET    Take 1 tablet (40 mg total) by mouth daily.   SOLIFENACIN (VESICARE) 10 MG TABLET    TAKE 1 TABLET BY MOUTH EVERY DAY   TIZANIDINE (ZANAFLEX) 4 MG TABLET    Take 1 tablet (4 mg total) by mouth every 6 (six) hours as needed for muscle spasms.   TORSEMIDE (DEMADEX) 20 MG TABLET    Take 40 mg by mouth daily as needed (fluid retention).   VENLAFAXINE XR (EFFEXOR XR) 75 MG 24 HR CAPSULE    Take 3 capsules (225 mg total) by mouth daily.  Modified Medications   No medications on file  Discontinued Medications   No medications on file   ----------------------------------------------------------------------------------------------------------------------  Follow-up: Return in about 1 month (around 02/12/2021) for evaluation, procedure.   Procedure: L5-S1 LESI with fluoroscopic guidance and no moderate sedation  NOTE: The risks, benefits, and expectations of the procedure have been discussed and explained to the patient who was understanding and in agreement with suggested treatment plan. No guarantees were made.  DESCRIPTION OF PROCEDURE: Lumbar epidural steroid injection with no IV Versed, EKG, blood pressure, pulse, and pulse oximetry monitoring. The procedure was performed with the patient in the prone position under fluoroscopic guidance.  Sterile prep x3 was initiated and I then injected subcutaneous lidocaine to the overlying L5-S1 site after its fluoroscopic identifictation.  Using strict aseptic technique, I then advanced an 18-gauge Tuohy epidural needle in the midline using interlaminar approach via loss-of-resistance to saline technique. There was negative aspiration for heme or  CSF.  I then confirmed position with both AP and Lateral fluoroscan.  2 cc of contrast dye were injected and a  total of 5 mL of Preservative-Free normal saline mixed with 40 mg of Kenalog and 1cc Ropicaine 0.2 percent were injected incrementally via the  epidurally  placed needle. The needle was removed. The patient tolerated the injection well and was convalesced and discharged to home in stable condition. Should the patient have any post procedure difficulty they have been instructed on how to contact us for assistance.    [name], MD

## 2021-01-19 ENCOUNTER — Other Ambulatory Visit: Payer: Self-pay | Admitting: Nurse Practitioner

## 2021-01-19 DIAGNOSIS — F419 Anxiety disorder, unspecified: Secondary | ICD-10-CM

## 2021-01-19 DIAGNOSIS — I10 Essential (primary) hypertension: Secondary | ICD-10-CM

## 2021-01-19 NOTE — Telephone Encounter (Signed)
Requested Prescriptions  Pending Prescriptions Disp Refills   LORazepam (ATIVAN) 1 MG tablet [Pharmacy Med Name: LORAZEPAM 1 MG TABLET] 90 tablet 0    Sig: TAKE 1 TABLET BY MOUTH AT BEDTIME.     Not Delegated - Psychiatry:  Anxiolytics/Hypnotics Failed - 01/19/2021  7:10 PM      Failed - This refill cannot be delegated      Failed - Urine Drug Screen completed in last 360 days      Passed - Valid encounter within last 6 months    Recent Outpatient Visits          4 weeks ago Abdominal pain, chronic, right lower quadrant   [facility] [place], [name], NP   2 months ago Morbid obesity ([place])   [place], [name], NP   2 months ago Need for influenza vaccination   [place], Lauren A, NP   4 months ago Right lower quadrant abdominal pain   [place], [place] T, NP   5 months ago Depression, recurrent ([place])   [place], Barbaraann Faster, NP      Future Appointments            In 1 month Cannady, Barbaraann Faster, NP [employer], [facility]   In 2 months Turner, Eber Hong, MD [place], [facility]   In 3 months  [place], [facility]            gabapentin (NEURONTIN) 600 MG tablet [employer] Med Name: GABAPENTIN 600 MG TABLET] 90 tablet 0    Sig: TAKE 1 TABLET BY MOUTH EVERYDAY AT BEDTIME     Neurology: Anticonvulsants - gabapentin Passed - 01/19/2021  7:10 PM      Passed - Valid encounter within last 12 months    Recent Outpatient Visits          4 weeks ago Abdominal pain, chronic, right lower quadrant   [facility] [place], Jolene T, NP   2 months ago Morbid obesity ([place])   [place], [place] T, NP   2 months ago Need for influenza vaccination   [place], Lauren A, NP   4 months ago Right lower quadrant abdominal pain   [place], [place] T, NP   5 months ago  Depression, recurrent ([place])   [place], Barbaraann Faster, NP      Future Appointments            In 1 month Cannady, Barbaraann Faster, NP [employer], [facility]   In 2 months Turner, Eber Hong, MD [place], [facility]   In 3 months  [place], [facility]            solifenacin (VESICARE) 10 MG tablet [employer] Med Name: SOLIFENACIN 10 MG TABLET] 90 tablet 1    Sig: TAKE 1 [place]     Urology:  Bladder Agents Passed - 01/19/2021  7:10 PM      Passed - Valid encounter within last 12 months    Recent Outpatient Visits          4 weeks ago Abdominal pain, chronic, right lower quadrant   [facility] [place], Jolene T, NP   2 months ago Morbid obesity ([place])   [place], [place] [employer] T, NP   2 months ago Need for influenza vaccination   [facility] [place], Lauren A, NP   4 months ago Right lower quadrant abdominal pain   [place], [place] T, NP   5 months ago Depression, recurrent ([place])   [place], Barbaraann Faster, NP      Future Appointments            In 1 month Cannady, Barbaraann Faster, NP [employer], [facility]   In 2 months Turner, Eber Hong, MD [place], [facility]   In 3 months  [place], [facility]            tamsulosin (FLOMAX) 0.4 MG CAPS capsule [Pharmacy Med Name: TAMSULOSIN HCL 0.4 MG CAPSULE] 90 capsule 1    Sig: TAKE 1 CAPSULE BY MOUTH EVERY DAY AFTER SUPPER     Urology: Alpha-Adrenergic Blocker Failed - 01/19/2021  7:10 PM      Failed - Last BP in normal range    BP Readings from Last 1 Encounters:  01/12/21 (!) 162/87         Passed - Valid encounter within last 12 months    Recent Outpatient Visits          4 weeks ago Abdominal pain, chronic, right lower quadrant   [facility] [place], [place] T, NP   2 months ago Morbid obesity ([place])   [place], [name], NP   2 months ago Need for influenza vaccination   [place], Lauren A, NP   4 months ago Right lower quadrant abdominal pain   [place], [place] T, NP   5 months ago Depression, recurrent ([place])   [place], Barbaraann Faster, NP      Future Appointments            In 1 month Cannady, Barbaraann Faster, NP [employer], [facility]   In 2 months Turner, Eber Hong, MD [place], [facility]   In 3 months  [employer], [place]

## 2021-01-19 NOTE — Telephone Encounter (Signed)
Requested medications are due for refill today.   Lorazepam yes,  Tamsulosin no  Requested medications are on the active medications list.  Lorazepam yes, Tamsulosin no  Last refill. Lorazepam 08/11/2020, Tamsulosin 09/21/2020  Future visit scheduled.   yes  Notes to clinic.  Lorazepam is not delegated. Tamsulosin was discontinued d/c'd 10/05/2020 by urology.    Requested Prescriptions  Pending Prescriptions Disp Refills   LORazepam (ATIVAN) 1 MG tablet [Pharmacy Med Name: LORAZEPAM 1 MG TABLET] 90 tablet 0    Sig: TAKE 1 TABLET BY MOUTH AT BEDTIME.     Not Delegated - Psychiatry:  Anxiolytics/Hypnotics Failed - 01/19/2021  7:10 PM      Failed - This refill cannot be delegated      Failed - Urine Drug Screen completed in last 360 days      Passed - Valid encounter within last 6 months    Recent Outpatient Visits           4 weeks ago Abdominal pain, chronic, right lower quadrant   [place], [name], NP   2 months ago Morbid obesity ([place])   [place], [name], NP   2 months ago Need for influenza vaccination   [place], Lauren A, NP   4 months ago Right lower quadrant abdominal pain   [place], [place] T, NP   5 months ago Depression, recurrent ([place])   [place], Barbaraann Faster, NP       Future Appointments             In 1 month Cannady, Barbaraann Faster, NP [employer], [facility]   In 2 months Turner, Eber Hong, MD [place], [facility]   In 3 months  [place], [facility]             tamsulosin (FLOMAX) 0.4 MG CAPS capsule [Pharmacy Med Name: TAMSULOSIN HCL 0.4 MG CAPSULE] 90 capsule 1    Sig: TAKE 1 CAPSULE BY MOUTH EVERY DAY AFTER SUPPER     Urology: Alpha-Adrenergic Blocker Failed - 01/19/2021  7:10 PM      Failed - Last BP in normal range    BP Readings from Last 1 Encounters:  01/12/21 (!) 162/87           Passed - Valid encounter within last 12 months    Recent Outpatient Visits           4 weeks ago Abdominal pain, chronic, right lower quadrant   [facility] [place], [place] T, NP   2 months ago Morbid obesity ([place])   [place], [place] T, NP   2 months ago Need for influenza vaccination   [place], Lauren A, NP   4 months ago Right lower quadrant abdominal pain   [place], [place] T, NP   5 months ago Depression, recurrent ([place])   [employer], Barbaraann Faster, NP       Future Appointments             In 1 month Cannady, Barbaraann Faster, NP [employer], [facility]   In 2 months [place], Eber Hong, MD [place], [facility]   In 3 months  [employer], [facility]            Signed Prescriptions Disp Refills   gabapentin (NEURONTIN) 600 MG tablet 90 tablet 1  Sig: TAKE 1 TABLET BY MOUTH EVERYDAY AT BEDTIME     Neurology: Anticonvulsants - gabapentin Passed - 01/19/2021  7:10 PM      Passed - Valid encounter within last 12 months    Recent Outpatient Visits           4 weeks ago Abdominal pain, chronic, right lower quadrant   [facility] [place], [name], NP   2 months ago Morbid obesity ([place])   [place], [name], NP   2 months ago Need for influenza vaccination   [place], Lauren A, NP   4 months ago Right lower quadrant abdominal pain   [place], [place] T, NP   5 months ago Depression, recurrent ([place])   [place], Barbaraann Faster, NP       Future Appointments             In 1 month Cannady, Barbaraann Faster, NP [employer], [facility]   In 2 months [place], Eber Hong, MD [place], [facility]   In 3 months  [place], [facility]             solifenacin (VESICARE) 10 MG tablet 90 tablet 1    Sig: TAKE 1  [place]     Urology:  Bladder Agents Passed - 01/19/2021  7:10 PM      Passed - Valid encounter within last 12 months    Recent Outpatient Visits           4 weeks ago Abdominal pain, chronic, right lower quadrant   [facility] [place], [place] T, NP   2 months ago Morbid obesity ([place])   [place], [place] T, NP   2 months ago Need for influenza vaccination   [place], Lauren A, NP   4 months ago Right lower quadrant abdominal pain   [place], [place] T, NP   5 months ago Depression, recurrent ([place])   [place], Barbaraann Faster, NP       Future Appointments             In 1 month Cannady, Barbaraann Faster, NP [employer], [facility]   In 2 months [place], Eber Hong, MD [place], [facility]   In 3 months  [employer], [facility]

## 2021-01-20 ENCOUNTER — Encounter: Payer: Self-pay | Admitting: Nurse Practitioner

## 2021-02-15 ENCOUNTER — Other Ambulatory Visit: Payer: Self-pay

## 2021-02-15 ENCOUNTER — Encounter: Payer: Self-pay | Admitting: Anesthesiology

## 2021-02-15 ENCOUNTER — Ambulatory Visit: Payer: Medicare Other | Attending: Anesthesiology | Admitting: Anesthesiology

## 2021-02-15 ENCOUNTER — Other Ambulatory Visit: Payer: Self-pay | Admitting: Nurse Practitioner

## 2021-02-15 DIAGNOSIS — M5416 Radiculopathy, lumbar region: Secondary | ICD-10-CM

## 2021-02-15 DIAGNOSIS — M5431 Sciatica, right side: Secondary | ICD-10-CM

## 2021-02-15 DIAGNOSIS — G8929 Other chronic pain: Secondary | ICD-10-CM

## 2021-02-15 DIAGNOSIS — M5432 Sciatica, left side: Secondary | ICD-10-CM

## 2021-02-15 DIAGNOSIS — M79642 Pain in left hand: Secondary | ICD-10-CM

## 2021-02-15 DIAGNOSIS — M47816 Spondylosis without myelopathy or radiculopathy, lumbar region: Secondary | ICD-10-CM

## 2021-02-15 DIAGNOSIS — R1031 Right lower quadrant pain: Secondary | ICD-10-CM | POA: Diagnosis not present

## 2021-02-15 DIAGNOSIS — M79641 Pain in right hand: Secondary | ICD-10-CM

## 2021-02-15 DIAGNOSIS — M1611 Unilateral primary osteoarthritis, right hip: Secondary | ICD-10-CM

## 2021-02-15 DIAGNOSIS — M5136 Other intervertebral disc degeneration, lumbar region: Secondary | ICD-10-CM | POA: Diagnosis not present

## 2021-02-15 DIAGNOSIS — F1190 Opioid use, unspecified, uncomplicated: Secondary | ICD-10-CM

## 2021-02-15 DIAGNOSIS — G894 Chronic pain syndrome: Secondary | ICD-10-CM

## 2021-02-15 MED ORDER — HYDROCODONE-ACETAMINOPHEN 5-325 MG PO TABS: 1.0000 | ORAL_TABLET | Freq: Two times a day (BID) | ORAL | 0 refills | Status: DC

## 2021-02-15 NOTE — Telephone Encounter (Signed)
Requested Prescriptions  Pending Prescriptions Disp Refills   rosuvastatin (CRESTOR) 40 MG tablet [Pharmacy Med Name: ROSUVASTATIN CALCIUM 40 MG TAB] 90 tablet 4    Sig: TAKE 1 TABLET BY MOUTH EVERY DAY     Cardiovascular:  Antilipid - Statins Passed - 02/15/2021  1:31 AM      Passed - Total Cholesterol in normal range and within 360 days    Cholesterol, Total  Date Value Ref Range Status  08/11/2020 140 100 - 199 mg/dL Final   Cholesterol Piccolo, Waived  Date Value Ref Range Status  01/03/2018 198 <200 mg/dL Final    Comment:                            Desirable                <200                         Borderline High      200- 239                         High                     >239          Passed - LDL in normal range and within 360 days    LDL Chol Calc (NIH)  Date Value Ref Range Status  08/11/2020 65 0 - 99 mg/dL Final         Passed - HDL in normal range and within 360 days    HDL  Date Value Ref Range Status  08/11/2020 54 >39 mg/dL Final         Passed - Triglycerides in normal range and within 360 days    Triglycerides  Date Value Ref Range Status  08/11/2020 118 0 - 149 mg/dL Final   Triglycerides Piccolo,Waived  Date Value Ref Range Status  01/03/2018 264 (H) <150 mg/dL Final    Comment:                            Normal                   <150                         Borderline High     150 - 199                         High                200 - 499                         Very High                >499          Passed - Patient is not pregnant      Passed - Valid encounter within last 12 months    Recent Outpatient Visits          1 month ago Abdominal pain, chronic, right lower quadrant   [place], Jolene T, NP   3 months ago Morbid obesity ([place])   Latty  Venita Lick, NP   3 months ago Need for influenza vaccination   [place], Lauren A, NP   4 months ago Right lower  quadrant abdominal pain   [place], [place] T, NP   6 months ago Depression, recurrent ([place])   Newell, Barbaraann Faster, NP      Future Appointments            In 6 days Cannady, Barbaraann Faster, NP [facility]   In 1 month Turner, Eber Hong, MD [place]   In 2 months  [facility], [place]

## 2021-02-18 NOTE — Patient Instructions (Signed)
Prediabetes Eating Plan °Prediabetes is a condition that causes blood sugar (glucose) levels to be higher than normal. This increases the risk for developing type 2 diabetes (type 2 diabetes mellitus). Working with a health care provider or nutrition specialist (dietitian) to make diet and lifestyle changes can help prevent the onset of diabetes. These changes may help you: °Control your blood glucose levels. °Improve your cholesterol levels. °Manage your blood pressure. °What are tips for following this plan? °Reading food labels °Read food labels to check the amount of fat, salt (sodium), and sugar in prepackaged foods. Avoid foods that have: °Saturated fats. °Trans fats. °Added sugars. °Avoid foods that have more than 300 milligrams (mg) of sodium per serving. Limit your sodium intake to less than 2,300 mg each day. °Shopping °Avoid buying pre-made and processed foods. °Avoid buying drinks with added sugar. °Cooking °Cook with olive oil. Do not use butter, lard, or ghee. °Bake, broil, grill, steam, or boil foods. Avoid frying. °Meal planning ° °Work with your dietitian to create an eating plan that is right for you. This may include tracking how many calories you take in each day. Use a food diary, notebook, or mobile application to track what you eat at each meal. °Consider following a Mediterranean diet. This includes: °Eating several servings of fresh fruits and vegetables each day. °Eating fish at least twice a week. °Eating one serving each day of whole grains, beans, nuts, and seeds. °Using olive oil instead of other fats. °Limiting alcohol. °Limiting red meat. °Using nonfat or low-fat dairy products. °Consider following a plant-based diet. This includes dietary choices that focus on eating mostly vegetables and fruit, grains, beans, nuts, and seeds. °If you have high blood pressure, you may need to limit your sodium intake or follow a diet such as the DASH (Dietary Approaches to Stop Hypertension) eating  plan. The DASH diet aims to lower high blood pressure. °Lifestyle °Set weight loss goals with help from your health care team. It is recommended that most people with prediabetes lose 7% of their body weight. °Exercise for at least 30 minutes 5 or more days a week. °Attend a support group or seek support from a mental health counselor. °Take over-the-counter and prescription medicines only as told by your health care provider. °What foods are recommended? °Fruits °Berries. Bananas. Apples. Oranges. Grapes. Papaya. Mango. Pomegranate. Kiwi. Grapefruit. Cherries. °Vegetables °Lettuce. Spinach. Peas. Beets. Cauliflower. Cabbage. Broccoli. Carrots. Tomatoes. Squash. Eggplant. Herbs. Peppers. Onions. Cucumbers. Brussels sprouts. °Grains °Whole grains, such as whole-wheat or whole-grain breads, crackers, cereals, and pasta. Unsweetened oatmeal. Bulgur. Barley. Quinoa. Brown rice. Corn or whole-wheat flour tortillas or taco shells. °Meats and other proteins °Seafood. Poultry without skin. Lean cuts of pork and beef. Tofu. Eggs. Nuts. Beans. °Dairy °Low-fat or fat-free dairy products, such as yogurt, cottage cheese, and cheese. °Beverages °Water. Tea. Coffee. Sugar-free or diet soda. Seltzer water. Low-fat or nonfat milk. Milk alternatives, such as soy or almond milk. °Fats and oils °Olive oil. Canola oil. Sunflower oil. Grapeseed oil. Avocado. Walnuts. °Sweets and desserts °Sugar-free or low-fat pudding. Sugar-free or low-fat ice cream and other frozen treats. °Seasonings and condiments °Herbs. Sodium-free spices. Mustard. Relish. Low-salt, low-sugar ketchup. Low-salt, low-sugar barbecue sauce. Low-fat or fat-free mayonnaise. °The items listed above may not be a complete list of recommended foods and beverages. Contact a dietitian for more information. °What foods are not recommended? °Fruits °Fruits canned with syrup. °Vegetables °Canned vegetables. Frozen vegetables with butter or cream sauce. °Grains °Refined white  flour and flour   products, such as bread, pasta, snack foods, and cereals. °Meats and other proteins °Fatty cuts of meat. Poultry with skin. Breaded or fried meat. Processed meats. °Dairy °Full-fat yogurt, cheese, or milk. °Beverages °Sweetened drinks, such as iced tea and soda. °Fats and oils °Butter. Lard. Ghee. °Sweets and desserts °Baked goods, such as cake, cupcakes, pastries, cookies, and cheesecake. °Seasonings and condiments °Spice mixes with added salt. Ketchup. Barbecue sauce. Mayonnaise. °The items listed above may not be a complete list of foods and beverages that are not recommended. Contact a dietitian for more information. °Where to find more information °American Diabetes Association: www.diabetes.org °Summary °You may need to make diet and lifestyle changes to help prevent the onset of diabetes. These changes can help you control blood sugar, improve cholesterol levels, and manage blood pressure. °Set weight loss goals with help from your health care team. It is recommended that most people with prediabetes lose 7% of their body weight. °Consider following a Mediterranean diet. This includes eating plenty of fresh fruits and vegetables, whole grains, beans, nuts, seeds, fish, and low-fat dairy, and using olive oil instead of other fats. °This information is not intended to replace advice given to you by your health care provider. Make sure you discuss any questions you have with your health care provider. °Document Revised: 04/10/2019 Document Reviewed: 04/10/2019 °Elsevier Patient Education © 2022 [employer] ° °

## 2021-02-21 ENCOUNTER — Ambulatory Visit (INDEPENDENT_AMBULATORY_CARE_PROVIDER_SITE_OTHER): Payer: Medicare Other | Admitting: Nurse Practitioner

## 2021-02-21 ENCOUNTER — Encounter: Payer: Self-pay | Admitting: Nurse Practitioner

## 2021-02-21 ENCOUNTER — Other Ambulatory Visit: Payer: Self-pay

## 2021-02-21 VITALS — BP 137/84 | HR 90 | Temp 97.5°F | Ht 70.0 in | Wt 282.8 lb

## 2021-02-21 DIAGNOSIS — I700 Atherosclerosis of aorta: Secondary | ICD-10-CM

## 2021-02-21 DIAGNOSIS — E785 Hyperlipidemia, unspecified: Secondary | ICD-10-CM

## 2021-02-21 DIAGNOSIS — Z79899 Other long term (current) drug therapy: Secondary | ICD-10-CM

## 2021-02-21 DIAGNOSIS — E1169 Type 2 diabetes mellitus with other specified complication: Secondary | ICD-10-CM | POA: Diagnosis not present

## 2021-02-21 DIAGNOSIS — Z950 Presence of cardiac pacemaker: Secondary | ICD-10-CM

## 2021-02-21 DIAGNOSIS — I10 Essential (primary) hypertension: Secondary | ICD-10-CM

## 2021-02-21 DIAGNOSIS — I442 Atrioventricular block, complete: Secondary | ICD-10-CM | POA: Diagnosis not present

## 2021-02-21 DIAGNOSIS — I480 Paroxysmal atrial fibrillation: Secondary | ICD-10-CM

## 2021-02-21 DIAGNOSIS — I152 Hypertension secondary to endocrine disorders: Secondary | ICD-10-CM

## 2021-02-21 DIAGNOSIS — I7143 Infrarenal abdominal aortic aneurysm, without rupture: Secondary | ICD-10-CM

## 2021-02-21 DIAGNOSIS — N1831 Chronic kidney disease, stage 3a: Secondary | ICD-10-CM

## 2021-02-21 DIAGNOSIS — G2581 Restless legs syndrome: Secondary | ICD-10-CM

## 2021-02-21 DIAGNOSIS — G4733 Obstructive sleep apnea (adult) (pediatric): Secondary | ICD-10-CM

## 2021-02-21 DIAGNOSIS — G894 Chronic pain syndrome: Secondary | ICD-10-CM

## 2021-02-21 DIAGNOSIS — F339 Major depressive disorder, recurrent, unspecified: Secondary | ICD-10-CM

## 2021-02-21 DIAGNOSIS — E1159 Type 2 diabetes mellitus with other circulatory complications: Secondary | ICD-10-CM

## 2021-02-21 DIAGNOSIS — R7301 Impaired fasting glucose: Secondary | ICD-10-CM

## 2021-02-21 DIAGNOSIS — E669 Obesity, unspecified: Secondary | ICD-10-CM

## 2021-02-21 DIAGNOSIS — F419 Anxiety disorder, unspecified: Secondary | ICD-10-CM

## 2021-02-21 DIAGNOSIS — D6869 Other thrombophilia: Secondary | ICD-10-CM

## 2021-02-21 DIAGNOSIS — I951 Orthostatic hypotension: Secondary | ICD-10-CM

## 2021-02-21 DIAGNOSIS — N400 Enlarged prostate without lower urinary tract symptoms: Secondary | ICD-10-CM

## 2021-02-21 LAB — BAYER DCA HB A1C WAIVED: HB A1C (BAYER DCA - WAIVED): 6.6 % — ABNORMAL HIGH (ref 4.8–5.6)

## 2021-02-21 MED ORDER — OZEMPIC (0.25 OR 0.5 MG/DOSE) 2 MG/1.5ML ~~LOC~~ SOPN: PEN_INJECTOR | SUBCUTANEOUS | 12 refills | Status: DC

## 2021-02-21 NOTE — Assessment & Plan Note (Signed)
BMI 40.58 with HTN, A-Fib, Heart Block, and CKD.  Recommended eating smaller high protein, low fat meals more frequently and exercising 30 mins a day 5 times a week with a goal of 10-15lb weight loss in the next 3 months. Patient voiced their understanding and motivation to adhere to these recommendations.

## 2021-02-21 NOTE — Assessment & Plan Note (Signed)
Has been on Ativan for many years, with trial of cutting back unsuccessful.  At length discussions about risk of opioid and benzo use had with patient and wife by both PCP and CCM PharmD.  Continue these discussions.  Refer to anxiety plan. 

## 2021-02-21 NOTE — Assessment & Plan Note (Signed)
Noted on imaging on 11/22/19.  With recommendation to monitor annually.  Will continue statin and monitor BP control.  Last CT abdomen in April 2022 noted "non-aneurysmal abdominal aorta".  Will repeat in April 2023.

## 2021-02-21 NOTE — Assessment & Plan Note (Signed)
Continue collaboration with cardiology + neurology + medication regimen as prescribed by them.  Recommend ensuring good hydration throughout daytime hours + use of compression hose at home (on during daytime and off during night), agrees to try them, although does endorse not wearing. 

## 2021-02-21 NOTE — Progress Notes (Signed)
BP 137/84    Pulse 90    Temp (!) 97.5 F (36.4 C) (Oral)    Ht 5\' 10"  (1.778 m)    Wt 282 lb 12.8 oz (128.3 kg)    SpO2 97%    BMI 40.58 kg/m    Subjective:    Patient ID: [name], male    DOB: [birth year], 76 y.o.   MRN: [id number]  HPI: Nicholas [name] is a 76 y.o. male  Chief Complaint  Patient presents with   Elevated PSA   Hyperlipidemia   Hypertension   A1c   Mood   Wife present at bedside with patient.  HYPERTENSION / HYPERLIPIDEMIA/A-FIB Followed by cardiology with last visit 12/06/20.  Had ablation on 05/17/20.  Last echo was in October 2021 noting EF 60-65%., no LVH, and Grade I Diastolic dysfunction.  History of AAA on imaging, but recent in April 2022 improved.  Diagnosed with OSA in June 2022 -- is using CPAP machine nightly, although he does not like it -- it hurts.  Reports does not qualify for Inspire.     Saw neurology last 02/01/21 for neuropathy and orthostatic BP -- continues Midodrine 10 MG TID for orthostatic hypotension and on Florinef 0.1 MG daily -- they are monitoring for autonomic neuropathy = no medication changes made.  His wife reports they are going to do biopsy for Parkinson's.   Continues on Rosuvastatin, Eliquis, fish oil. Satisfied with current treatment? yes Duration of hypertension: chronic BP monitoring frequency: not checking BP range:  BP medication side effects: no Duration of hyperlipidemia: chronic Cholesterol medication side effects: no Cholesterol supplements: fish oil Medication compliance: good compliance Aspirin: no Recent stressors: no Recurrent headaches: no Visual changes: no Palpitations: no Dyspnea: walking up driveway (has a hill) and when getting out of shower to dry off Chest pain: no Lower extremity edema: no Dizzy/lightheaded: occasionally still  IFG: Recent A1c 6% in October, reduced from previous of 6.5%.  Over holidays he does endorse eating lots of food. Polydipsia/polyuria: no Visual disturbance:  no Chest pain: no Paresthesias: no    CHRONIC KIDNEY DISEASE Improved from previous labs on recent check -- ongoing improvement since July 2022. CKD status: stable Medications renally dose: yes Previous renal evaluation: no Pneumovax:  Up to Date Influenza Vaccine:  Up to Date   BPH Last saw urology, Dr. Erlene Quan, on 10/05/20, elevation in PSA, but CT scan most recent showed unremarkable prostate.  Was to return to urology in 4 weeks, but on review did not attend follow-up.  Taking Vesicare and Tamsulosin. BPH status: stable Satisfied with current treatment?: yes Medication side effects: no Medication compliance: good compliance Duration: chronic Nocturia: 1/night Urinary frequency:no Incomplete voiding: yes Urgency: no Weak urinary stream: no Straining to start stream: no Dysuria: no Onset: gradual Severity: mild Alleviating factors:  Aggravating factors:  Treatments attempted:  IPSS Questionnaire (AUA-7): AUA = 2 Over the past month   1)  How often have you had a sensation of not emptying your bladder completely after you finish urinating?  0 - Not at all  2)  How often have you had to urinate again less than two hours after you finished urinating? 0 - Not at all  3)  How often have you found you stopped and started again several times when you urinated?  1 - Less than 1 time in 5  4) How difficult have you found it to postpone urination?  0 - Not at all  5) How often  have you had a weak urinary stream?  0 - Not at all  6) How often have you had to push or strain to begin urination?  0 - Not at all  7) How many times did you most typically get up to urinate from the time you went to bed until the time you got up in the morning?  1 - 1 time  Total score:  0-7 mildly symptomatic   8-19 moderately symptomatic   20-35 severely symptomatic    DEPRESSION & CHRONIC PAIN Taking Effexor, Seroquel, and Ativan.  Pt and his wife at bedside made aware of risks of benzo medication use  to include increased sedation, respiratory suppression, falls, extrapyramidal movements, dependence and cardiovascular events.  Pt and his wife would like to continue treatment as benefit determined to outweigh risk.  Have had at length discussions with him that he is also on opioid therapy.  Discussed risks with taking these three medications together at same time and recommend to separate them when taking Seroquel, Ativan, and opioid.   He has tried in past taking 1/2 tablet Ativan, but this does not work well (per wife and patient) and wishes to maintain current dosing.  Last Ativan fill on PDMP review 01/20/21 and last opioid fill 01/22/21 with recent UDS with pain management on 10/04/20. Saw Dr. Andree Elk on 02/15/21 last -- returning for injections. Duration:stable Anxious mood: yes  Excessive worrying: yes Irritability: yes Sweating: no Nausea: no Palpitations:no Hyperventilation: no Panic attacks: no Agoraphobia: no  Obscessions/compulsions: no Depressed mood: yes Depression screen [facility] 2/9 02/21/2021 01/12/2021 10/22/2020 08/11/2020 05/12/2020  Decreased Interest 0 0 1 0 0  Down, Depressed, Hopeless 0 0 1 1 0  PHQ - 2 Score 0 0 2 1 0  Altered sleeping 1 - 2 0 0  Tired, decreased energy 0 - 2 3 3   Change in appetite 0 - 0 1 0  Feeling bad or failure about yourself  0 - 0 0 0  Trouble concentrating 0 - 0 0 0  Moving slowly or fidgety/restless 0 - 0 0 0  Suicidal thoughts 0 - 0 0 0  PHQ-9 Score 1 - 6 5 3   Difficult doing work/chores Not difficult at all - - Somewhat difficult -  Some recent data might be hidden    GAD 7 : Generalized Anxiety Score 02/21/2021 05/12/2020 02/11/2020 08/14/2018  Nervous, Anxious, on Edge 0 0 0 0  Control/stop worrying 0 1 1 1   Worry too much - different things 0 1 1 2   Trouble relaxing 0 0 0 0  Restless 0 0 0 0  Easily annoyed or irritable 0 0 0 0  Afraid - awful might happen 0 0 0 1  Total GAD 7 Score 0 2 2 4   Anxiety Difficulty Not difficult at all Not  difficult at all Not difficult at all Not difficult at all    Relevant past medical, surgical, family and social history reviewed and updated as indicated. Interim medical history since our last visit reviewed. Allergies and medications reviewed and updated.  Review of Systems  Constitutional:  Negative for activity change, appetite change, fatigue and fever.  Respiratory:  Negative for cough, chest tightness, shortness of breath and wheezing.   Cardiovascular:  Negative for chest pain, palpitations and leg swelling.  Endocrine: Negative for polydipsia, polyphagia and polyuria.  Musculoskeletal:  Positive for arthralgias.  Neurological: Negative.   Psychiatric/Behavioral:  Negative for decreased concentration, self-injury, sleep disturbance and suicidal ideas. The patient is  not nervous/anxious.    Per HPI unless specifically indicated above     Objective:    BP 137/84    Pulse 90    Temp (!) 97.5 F (36.4 C) (Oral)    Ht 5\' 10"  (1.778 m)    Wt 282 lb 12.8 oz (128.3 kg)    SpO2 97%    BMI 40.58 kg/m   Wt Readings from Last 3 Encounters:  02/21/21 282 lb 12.8 oz (128.3 kg)  01/12/21 282 lb (127.9 kg)  12/21/20 282 lb 12.8 oz (128.3 kg)    Physical Exam Vitals and nursing note reviewed.  Constitutional:      General: He is awake. He is not in acute distress.    Appearance: He is well-developed and well-groomed. He is obese.  HENT:     Head: Normocephalic and atraumatic.     Right Ear: Hearing normal. No drainage.     Left Ear: Hearing normal. No drainage.  Eyes:     General: Lids are normal.        Right eye: No discharge.        Left eye: No discharge.     Conjunctiva/sclera: Conjunctivae normal.     Pupils: Pupils are equal, round, and reactive to light.  Neck:     Thyroid: No thyromegaly.     Vascular: No carotid bruit.  Cardiovascular:     Rate and Rhythm: Normal rate and regular rhythm.     Heart sounds: Normal heart sounds, S1 normal and S2 normal. No murmur  heard.   No gallop.  Pulmonary:     Effort: Pulmonary effort is normal. No accessory muscle usage or respiratory distress.     Breath sounds: Normal breath sounds.  Abdominal:     General: Bowel sounds are normal. There is no distension.     Palpations: Abdomen is soft.     Tenderness: There is no abdominal tenderness.  Musculoskeletal:        General: Normal range of motion.     Cervical back: Normal range of motion and neck supple.     Right lower leg: No edema.     Left lower leg: No edema.  Skin:    General: Skin is warm and dry.  Neurological:     Mental Status: He is alert and oriented to person, place, and time.  Psychiatric:        Attention and Perception: Attention normal.        Mood and Affect: Mood normal.        Behavior: Behavior normal. Behavior is cooperative.        Thought Content: Thought content normal.   Results for orders placed or performed in visit on 12/13/20  [facility] [place]  Result Value Ref Range   Date Time Interrogation Session [phone]    Pulse Generator Manufacturer MERM    Pulse Gen Model [id number] Advisa DR MRI    Pulse Gen Serial Number [id number] [facility] Name [place] Pulse Generator Type Implantable Pulse Generator    Implantable Pulse Generator Implant Date [id number]    Implantable Lead Manufacturer [facility]    Implantable Lead Model 3570 SelectSecure MRI SureScan    Implantable Lead Serial Number [id number]    Implantable Lead Implant Date [id number]    Implantable Lead Location Detail 1 UNKNOWN    Implantable Lead Special Function Bundle of His    Implantable Lead Location [id number]    Implantable Lead Manufacturer  MERM    Implantable Lead Model 5076 CapSureFix Novus MRI SureScan    Implantable Lead Serial Number [id number]    Implantable Lead Implant Date [id number]    Implantable Lead Location Detail 1 APPENDAGE    Implantable Lead Location [phone]    Lead Channel Setting Sensing Sensitivity 0.6 mV    Lead Channel Setting Pacing Amplitude 2 V   Lead Channel Setting Pacing Pulse Width 1 ms   Lead Channel Setting Pacing Amplitude 2.25 V   Lead Channel Impedance Value 418 ohm   Lead Channel Impedance Value 361 ohm   Lead Channel Sensing Intrinsic Amplitude 3.75 mV   Lead Channel Sensing Intrinsic Amplitude 3.75 mV   Lead Channel Pacing Threshold Amplitude 0.5 V   Lead Channel Pacing Threshold Pulse Width 0.4 ms   Lead Channel Impedance Value 532 ohm   Lead Channel Impedance Value 342 ohm   Lead Channel Sensing Intrinsic Amplitude 0.75 mV   Lead Channel Sensing Intrinsic Amplitude 0.75 mV   Lead Channel Pacing Threshold Amplitude 0.75 V   Lead Channel Pacing Threshold Pulse Width 0.4 ms   Battery Status OK    Battery Remaining Longevity 33 mo   Battery Voltage 2.97 V   Brady Statistic RA Percent Paced 40.42 %   Brady Statistic RV Percent Paced 99.95 %   Brady Statistic AP VP Percent 40.5 %   Brady Statistic AS VP Percent 59.45 %   Brady Statistic AP VS Percent 0.01 %   Brady Statistic AS VS Percent 0.04 %      Assessment & Plan:   Problem List Items Addressed This Visit       Cardiovascular and Mediastinum   Abdominal aortic aneurysm (AAA) (Chronic)    Noted on imaging on 11/22/19.  With recommendation to monitor annually.  Will continue statin and monitor BP control.  Last CT abdomen in April 2022 noted "non-aneurysmal abdominal aorta".  Will repeat in April 2023.      Aortic atherosclerosis (HCC) (Chronic)    Noted on past imaging, continue statin and BP control for prevention.      Relevant Orders   Comprehensive metabolic panel   Lipid Panel w/o Chol/HDL Ratio   PAF (paroxysmal atrial fibrillation) (HCC) (Chronic)    Chronic, stable, followed by cardiology.  Continue current medication regimen as prescribed by them.  Labs today.  Ablation last on 05/17/20.      Relevant Orders   Comprehensive metabolic panel   Chronic orthostatic hypotension    Continue  collaboration with cardiology + neurology + medication regimen as prescribed by them.  Recommend ensuring good hydration throughout daytime hours + use of compression hose at home (on during daytime and off during night), agrees to try them, although does endorse not wearing.      Complete heart block ([place])    Followed by cardiology.  Pacemaker checks by them.       Relevant Orders   Comprehensive metabolic panel   Hypertension associated with diabetes (HCC)    Chronic, ongoing.  Followed by cardiology.  BP at goal at home and in office.  With orthostatic BP presenting occasionally will continue current regimen at this time and have recommended utilizing compression hose at home, agrees to try this.  Continue collaboration with cardiology + neurology and current medication regimen.  Labs: CMP, CBC, TSH, urine ALB.  Recommend ensuring good fluid intake at home.        Relevant Medications   Semaglutide,0.25 or 0.5MG /DOS, (OZEMPIC, 0.25 OR 0.5  MG/DOSE,) 2 MG/1.5ML SOPN   Other Relevant Orders   Bayer DCA Hb A1c Waived   Comprehensive metabolic panel   CBC with Differential/Platelet   TSH   Microalbumin, Urine Waived     Respiratory   OSA (obstructive sleep apnea)    New diagnosis and awaiting CPAP equipment, discussed need to consistently use once obtained.        Endocrine   Hyperlipidemia associated with type 2 diabetes mellitus (HCC)    Chronic, ongoing.  Continue current medication regimen and adjust as needed. Lipid panel today.      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  At this time  he wishes to start medication for diabetes.  Would like to try a GLP1 -- will send in Ozempic 0.25 MG weekly.  No family history thyroid cancer and no history pancreatitis.  Will send in glucometer and supplies, provided him with log to monitor sugars and discussed.  Recommend heavy focus on diet and exercise.  Return in 4 weeks.      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  His recent labs since July 2022 have been within good ranges and improved.      Relevant Orders   Comprehensive metabolic panel   Microalbumin, Urine Waived   BPH (benign prostatic hyperplasia)    Chronic, stable with no current symptoms.  Check PSA today, has had some elevations but in normal range for age and ethnicity -- suspect more related to BPH.  Recommend he return to urology as was recommended by them.      Relevant Orders   PSA     Other   Anxiety (Chronic)    Chronic, ongoing.  Discussed at length risk of benzo and opioid use in conjunction with each other.  Recommend he not take the two together at same hour during day or evening.  He tried to cut back to 1/2 tablet but was unsuccessful in past.   Continue to collaborate with CCM team on education.  Provided wife and him with copy of [facility] benzo risk information patient sheet in past.  They request 90 day supply, as previous PCP supplied this.  Are aware he does have to return every 3 months for refills.  UDS up to date with pain management.  Refills sent.        Chronic pain syndrome (Chronic)    Chronic, ongoing followed by pain clinic.  Discussed at length risk of benzo and opioid use in conjunction with each other.  Recommend he not take the two together  at same hour during day or evening.        Depression, recurrent (HCC) (Chronic)    Chronic, ongoing.  Denies SI/HI.  Continue current medication regimen and adjust as  needed.  Would benefit from trial off Effexor and trial of SSRI, but refuses this.        Long-term current use of benzodiazepine (Chronic)    Has been on Ativan for many years, with trial of cutting back unsuccessful.  At length discussions about risk of opioid and benzo use had with patient and wife by both PCP and CCM PharmD.  Continue these discussions.  Refer to anxiety plan.      Morbid obesity (HCC) (Chronic)    BMI 40.58 with HTN, A-Fib, Heart Block, and CKD.  Recommended eating smaller high protein, low fat meals more frequently and exercising 30 mins a day 5 times a week with a goal of 10-15lb weight loss in the next 3 months. Patient voiced their understanding and motivation to adhere to these recommendations.       Relevant Medications   Semaglutide,0.25 or 0.5MG /DOS, (OZEMPIC, 0.25 OR 0.5 MG/DOSE,) 2 MG/1.5ML SOPN   Pacemaker - MDT (Chronic)    Regular checks with cardiology.      Restless leg syndrome (Chronic)    Followed by neurology, continue current medication regimen as prescribed by them.      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  Continue to monitor closely for bleeding or increased bruising.  CBC annually.        Follow up plan: Return in about 3 months (around 05/22/2021) for T2DM -- started Ozempic.

## 2021-02-21 NOTE — Assessment & Plan Note (Signed)
Chronic, stable with no current symptoms.  Check PSA today, has had some elevations but in normal range for age and ethnicity -- suspect more related to BPH.  Recommend he return to urology as was recommended by them.

## 2021-02-21 NOTE — Assessment & Plan Note (Signed)
Followed by cardiology.  Pacemaker checks by them.

## 2021-02-21 NOTE — Assessment & Plan Note (Addendum)
A1C 6.1% in May 2021 -- today 6.6%, discussed with him the various options for treating diabetes (GLP1, SGLT2, Metformin) including diet and exercise focus.  At this time he wishes to start medication for diabetes.  Would like to try a GLP1 -- will send in Ozempic 0.25 MG weekly.  No family history thyroid cancer and no history pancreatitis.  Will send in glucometer and supplies, provided him with log to monitor sugars and discussed.  Recommend heavy focus on diet and exercise.  Return in 4 weeks.

## 2021-02-21 NOTE — Assessment & Plan Note (Signed)
Ongoing stage 3a, recheck CMP today, current up to date, and adjust medications as needed.  His recent labs since July 2022 have been within good ranges and improved.

## 2021-02-21 NOTE — Assessment & Plan Note (Signed)
Followed by neurology, continue current medication regimen as prescribed by them. 

## 2021-02-21 NOTE — Assessment & Plan Note (Signed)
Chronic, stable, followed by cardiology.  Continue current medication regimen as prescribed by them.  Labs today.  Ablation last on 05/17/20.

## 2021-02-21 NOTE — Assessment & Plan Note (Signed)
On Eliquis with A-fib.  Continue to monitor closely for bleeding or increased bruising.  CBC annually. 

## 2021-02-21 NOTE — Assessment & Plan Note (Signed)
New diagnosis and awaiting CPAP equipment, discussed need to consistently use once obtained.

## 2021-02-21 NOTE — Assessment & Plan Note (Signed)
Chronic, ongoing followed by pain clinic.  Discussed at length risk of benzo and opioid use in conjunction with each other.  Recommend he not take the two together at same hour during day or evening.

## 2021-02-21 NOTE — Assessment & Plan Note (Signed)
Regular checks with cardiology. 

## 2021-02-21 NOTE — Assessment & Plan Note (Signed)
Chronic, ongoing.  Denies SI/HI.  Continue current medication regimen and adjust as needed.  Would benefit from trial off Effexor and trial of SSRI, but refuses this.   

## 2021-02-21 NOTE — Assessment & Plan Note (Signed)
Chronic, ongoing.  Followed by cardiology.  BP at goal at home and in office.  With orthostatic BP presenting occasionally will continue current regimen at this time and have recommended utilizing compression hose at home, agrees to try this.  Continue collaboration with cardiology + neurology and current medication regimen.  Labs: CMP, CBC, TSH, urine ALB.  Recommend ensuring good fluid intake at home.

## 2021-02-21 NOTE — Assessment & Plan Note (Signed)
Chronic, ongoing.  Continue current medication regimen and adjust as needed. Lipid panel today. 

## 2021-02-21 NOTE — Assessment & Plan Note (Signed)
Noted on past imaging, continue statin and BP control for prevention. 

## 2021-02-21 NOTE — Assessment & Plan Note (Signed)
Chronic, ongoing.  Discussed at length risk of benzo and opioid use in conjunction with each other.  Recommend he not take the two together at same hour during day or evening.  He tried to cut back to 1/2 tablet but was unsuccessful in past.   Continue to collaborate with CCM team on education.  Provided wife and him with copy of [facility] benzo risk information patient sheet in past.  They request 90 day supply, as previous PCP supplied this.  Are aware he does have to return every 3 months for refills.  UDS up to date with pain management.  Refills sent.

## 2021-02-22 ENCOUNTER — Encounter: Payer: Self-pay | Admitting: Anesthesiology

## 2021-02-22 ENCOUNTER — Telehealth: Payer: Self-pay | Admitting: Anesthesiology

## 2021-02-22 ENCOUNTER — Other Ambulatory Visit: Payer: Self-pay | Admitting: *Deleted

## 2021-02-22 LAB — CBC WITH DIFFERENTIAL/PLATELET
Basophils Absolute: 0 10*3/uL (ref 0.0–0.2)
Basos: 1 %
EOS (ABSOLUTE): 0.2 10*3/uL (ref 0.0–0.4)
Eos: 3 %
Hematocrit: 47.9 % (ref 37.5–51.0)
Hemoglobin: 16 g/dL (ref 13.0–17.7)
Immature Grans (Abs): 0 10*3/uL (ref 0.0–0.1)
Immature Granulocytes: 0 %
Lymphocytes Absolute: 1.8 10*3/uL (ref 0.7–3.1)
Lymphs: 24 %
MCH: 30.6 pg (ref 26.6–33.0)
MCHC: 33.4 g/dL (ref 31.5–35.7)
MCV: 92 fL (ref 79–97)
Monocytes Absolute: 0.6 10*3/uL (ref 0.1–0.9)
Monocytes: 8 %
Neutrophils Absolute: 4.8 10*3/uL (ref 1.4–7.0)
Neutrophils: 64 %
Platelets: 200 10*3/uL (ref 150–450)
RBC: 5.23 x10E6/uL (ref 4.14–5.80)
RDW: 12.9 % (ref 11.6–15.4)
WBC: 7.4 10*3/uL (ref 3.4–10.8)

## 2021-02-22 LAB — COMPREHENSIVE METABOLIC PANEL
ALT: 24 IU/L (ref 0–44)
AST: 26 IU/L (ref 0–40)
Albumin/Globulin Ratio: 1.8 (ref 1.2–2.2)
Albumin: 4.6 g/dL (ref 3.7–4.7)
Alkaline Phosphatase: 72 IU/L (ref 44–121)
BUN/Creatinine Ratio: 11 (ref 10–24)
BUN: 12 mg/dL (ref 8–27)
Bilirubin Total: 0.7 mg/dL (ref 0.0–1.2)
CO2: 25 mmol/L (ref 20–29)
Calcium: 9.6 mg/dL (ref 8.6–10.2)
Chloride: 100 mmol/L (ref 96–106)
Creatinine, Ser: 1.14 mg/dL (ref 0.76–1.27)
Globulin, Total: 2.5 g/dL (ref 1.5–4.5)
Glucose: 189 mg/dL — ABNORMAL HIGH (ref 70–99)
Potassium: 4.1 mmol/L (ref 3.5–5.2)
Sodium: 139 mmol/L (ref 134–144)
Total Protein: 7.1 g/dL (ref 6.0–8.5)
eGFR: 67 mL/min/{1.73_m2} (ref >59)

## 2021-02-22 LAB — PSA: Prostate Specific Ag, Serum: 4.6 ng/mL — ABNORMAL HIGH (ref 0.0–4.0)

## 2021-02-22 LAB — LIPID PANEL W/O CHOL/HDL RATIO
Cholesterol, Total: 154 mg/dL (ref 100–199)
HDL: 65 mg/dL (ref >39)
LDL Chol Calc (NIH): 67 mg/dL (ref 0–99)
Triglycerides: 127 mg/dL (ref 0–149)
VLDL Cholesterol Cal: 22 mg/dL (ref 5–40)

## 2021-02-22 LAB — TSH: TSH: 1.42 u[IU]/mL (ref 0.450–4.500)

## 2021-02-22 NOTE — Telephone Encounter (Signed)
Patient called [facility] to fill scripts and they do not have the 5mg  pain med, he says Dr. Andree Elk sent in a script for different strenth last time, please check on this and call patient

## 2021-02-22 NOTE — Telephone Encounter (Signed)
Patient's pharmacy did not have hydro- apap 5-325 in stock.  He has found [facility] that does have it.  Would like next Rx resent to [facility] in [place].  [facility] month [facility] at [facility].

## 2021-02-22 NOTE — Telephone Encounter (Signed)
Called patient. States his pharmacy does not have his mediations in stock. He is going to call around and let me know which pharmacy has a supply and call me back.

## 2021-02-22 NOTE — Progress Notes (Signed)
Virtual Visit via Telephone Note  I connected with Nicholas [name] on 02/22/21 at  9:20 AM EST by telephone and verified that I am speaking with the correct person using two identifiers.  Location: Patient: Home Provider: [facility] control center   I discussed the limitations, risks, security and privacy concerns of performing an evaluation and management service by telephone and the availability of in person appointments. I also discussed with the patient that there may be a patient responsible charge related to this service. The patient expressed understanding and agreed to proceed.   History of Present illness I spoke with Nicholas [name] today via telephone as we were unable to link for the video portion of this.  He still having a considerable amount of low back pain of the same quality characteristic and distribution as previously documented.  No other changes are reported.  But still mainly a gnawing aching pain down the low back with radiation into the hip buttocks and down his legs.  He takes his medications as prescribed and these continue to work effectively for him.  He generally taking these twice a day and this generally gives him enough pain relief to manage of the day.  He is sleeping decently at night.  The quality characteristic and distribution of the pain is otherwise unchanged.  Bowel bladder function and lower extremity strength and function are at baseline.  He states that in the past he has had epidural steroid injections.  His last one was back in December 2022 and this worked well for him.  He would like to set up a repeat injection sometime in mid February.  He feels that this has been effective in helping with the sciatica symptoms and enabling him to stay more active.  Review of systems: General: No fevers or chills Pulmonary: No shortness of breath or dyspnea Cardiac: No angina or palpitations or lightheadedness GI: No abdominal pain or constipation Psych: No depression   Observations/Objective:  Current Outpatient Medications:    acetaminophen (TYLENOL) 650 MG CR tablet, Take 650 mg by mouth every 8 (eight) hours as needed for pain., Disp: , Rfl:    apixaban (ELIQUIS) 5 MG TABS tablet, Take 1 tablet (5 mg total) by mouth 2 (two) times daily., Disp: 180 tablet, Rfl: 1   Ascorbic Acid (VITAMIN C) 1000 MG tablet, Take 1,000 mg by mouth daily., Disp: , Rfl:    Cranberry 500 MG CAPS, Take 500 mg by mouth daily., Disp: , Rfl:    gabapentin (NEURONTIN) 600 MG tablet, TAKE 1 TABLET BY MOUTH EVERYDAY AT BEDTIME, Disp: 90 tablet, Rfl: 1   HYDROcodone-acetaminophen (NORCO/VICODIN) 5-325 MG tablet, Take 1 tablet by mouth 2 (two) times daily., Disp: 60 tablet, Rfl: 0   [START ON 03/23/2021] HYDROcodone-acetaminophen (NORCO/VICODIN) 5-325 MG tablet, Take 1 tablet by mouth 2 (two) times daily., Disp: 60 tablet, Rfl: 0   LORazepam (ATIVAN) 1 MG tablet, TAKE 1 TABLET BY MOUTH AT BEDTIME., Disp: 90 tablet, Rfl: 0   Magnesium 400 MG TABS, Take 400 mg by mouth daily. , Disp: , Rfl:    MELATONIN PO, Take by mouth., Disp: , Rfl:    midodrine (PROAMATINE) 10 MG tablet, TAKE 1 TABLET BY MOUTH THREE TIMES A DAY, Disp: 270 tablet, Rfl: 3   Multiple Vitamin (MULTIVITAMIN WITH MINERALS) TABS tablet, Take 1 tablet by mouth daily. Centrum Silver, Disp: , Rfl:    Omega-3 Fatty Acids (FISH OIL) 1200 MG CAPS, Take 1,200 mg by mouth daily., Disp: , Rfl:  oxymetazoline (AFRIN) 0.05 % nasal spray, Place 2 sprays into both nostrils at bedtime., Disp: , Rfl:    pantoprazole (PROTONIX) 20 MG tablet, TAKE 2 TABLETS (40 MG TOTAL) BY MOUTH DAILY. (Patient taking differently: Take 20 mg by mouth daily.), Disp: 90 tablet, Rfl: 4   potassium chloride SA (KLOR-CON) 20 MEQ tablet, Take 2 tablets (40 meq) x 1 dose as directed (Patient taking differently: Take 40 mEq by mouth daily as needed (with torsemide (fluid retention)).), Disp: 10 tablet, Rfl: 0   QUEtiapine Fumarate (SEROQUEL XR) 150 MG 24 hr tablet,  TAKE 1 TABLET (150 MG TOTAL) BY MOUTH AT BEDTIME., Disp: 90 tablet, Rfl: 4   rOPINIRole (REQUIP) 0.25 MG tablet, Take 0.25 mg by mouth 4 (four) times daily., Disp: , Rfl:    rosuvastatin (CRESTOR) 40 MG tablet, TAKE 1 TABLET BY MOUTH EVERY DAY, Disp: 90 tablet, Rfl: 1   Semaglutide,0.25 or 0.5MG /DOS, (OZEMPIC, 0.25 OR 0.5 MG/DOSE,) 2 MG/1.5ML SOPN, Start with 0.25 MG into skin once a week x 4 weeks, then increase to 0.5 MG skin weekly., Disp: 1.5 mL, Rfl: 12   solifenacin (VESICARE) 10 MG tablet, TAKE 1 TABLET BY MOUTH EVERY DAY, Disp: 90 tablet, Rfl: 1   tamsulosin (FLOMAX) 0.4 MG CAPS capsule, TAKE 1 CAPSULE BY MOUTH EVERY DAY AFTER SUPPER, Disp: 90 capsule, Rfl: 4   tiZANidine (ZANAFLEX) 4 MG tablet, Take 1 tablet (4 mg total) by mouth every 6 (six) hours as needed for muscle spasms., Disp: 90 tablet, Rfl: 4   torsemide (DEMADEX) 20 MG tablet, Take 40 mg by mouth daily as needed (fluid retention)., Disp: , Rfl:    venlafaxine XR (EFFEXOR XR) 75 MG 24 hr capsule, Take 3 capsules (225 mg total) by mouth daily., Disp: 270 capsule, Rfl: 4   Past Medical History:  Diagnosis Date   Abdominal pain, chronic, right lower quadrant 12/13/2020   Anterior urethral stricture    Anxiety    Arthritis    a. knees, hips, hands;  b. 11/2013 s/p L TKA @ [place].   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 Lumbar radiculopathy   2. Degeneration of lumbar intervertebral disc   3. Bilateral sciatica   4. Abdominal pain, chronic, right lower quadrant   5. Chronic, continuous use of opioids   6. Chronic pain syndrome   7. Bilateral hand  pain   8. Facet syndrome, lumbar   9. Osteoarthritis of right hip, unspecified osteoarthritis type   Based on our discussion today and after review of the [facility] practitioner database information it is appropriate to continue with his current regimen.  Refills will be given for January 30 and March 1.  We will schedule him for 3-week return to clinic for a repeat epidural.  I think this is reasonable and that he has gained therapeutic relief from these and they have enabled him to be more active.  He is working to increase his activity and core strengthening and hopefully this combination in addition  to the low-dose opioid will be effective in keeping his pain under control and enabling him to stay active. Follow Up Instructions:    I discussed the assessment and treatment plan with the patient. The patient was provided an opportunity to ask questions and all were answered. The patient agreed with the plan and demonstrated an understanding of the instructions.   The patient was advised to call back or seek an in-person evaluation if the symptoms worsen or if the condition fails to improve as anticipated.  I provided 30 minutes of non-face-to-face time during this encounter.   [name], MD

## 2021-02-22 NOTE — Progress Notes (Signed)
Contacted via MyChart   Good afternoon Nicholas [name], your labs have returned: - CMP shows elevation in glucose, sugar, as expected -- let me know if any issues getting new medication.  Kidney function, creatinine and eGFR, is normal, as is liver function, AST and ALT. - Prostate level is coming down this check, in normal range for your age, however please ensure you follow-up with urology. - Remainder of labs look great -- no changes needed.  Any questions? Keep being amazing!!  Thank you for allowing me to participate in your care.  I appreciate you. Kindest regards, Aster Screws

## 2021-02-22 NOTE — Telephone Encounter (Signed)
Patient lvmail stating he found a pharmacy that has his meds. Please call pateint

## 2021-02-23 MED ORDER — HYDROCODONE-ACETAMINOPHEN 5-325 MG PO TABS: 1.0000 | ORAL_TABLET | Freq: Two times a day (BID) | ORAL | 0 refills | Status: DC

## 2021-02-23 NOTE — Telephone Encounter (Signed)
[facility] has supply

## 2021-02-23 NOTE — Addendum Note (Signed)
Addended by: Kaysea Raya G on: 02/23/2021 02:37 PM ° ° Modules accepted: Orders ° °

## 2021-02-23 NOTE — Telephone Encounter (Signed)
New script e-scribed in per JA. Patient called and informed.

## 2021-02-28 ENCOUNTER — Encounter: Payer: Self-pay | Admitting: Anesthesiology

## 2021-02-28 ENCOUNTER — Ambulatory Visit (HOSPITAL_BASED_OUTPATIENT_CLINIC_OR_DEPARTMENT_OTHER): Payer: Medicare Other | Admitting: Anesthesiology

## 2021-02-28 ENCOUNTER — Other Ambulatory Visit: Payer: Self-pay | Admitting: Anesthesiology

## 2021-02-28 ENCOUNTER — Ambulatory Visit
Admission: RE | Admit: 2021-02-28 | Discharge: 2021-02-28 | Disposition: A | Discharge status: Home / Self Care | Payer: Medicare Other | Source: Ambulatory Visit | Attending: Anesthesiology | Admitting: Anesthesiology

## 2021-02-28 ENCOUNTER — Other Ambulatory Visit: Payer: Self-pay

## 2021-02-28 VITALS — BP 144/91 | HR 82 | Temp 98.7°F | Resp 18 | Ht 70.0 in | Wt 282.0 lb

## 2021-02-28 DIAGNOSIS — G8929 Other chronic pain: Secondary | ICD-10-CM | POA: Insufficient documentation

## 2021-02-28 DIAGNOSIS — M5431 Sciatica, right side: Secondary | ICD-10-CM

## 2021-02-28 DIAGNOSIS — F1190 Opioid use, unspecified, uncomplicated: Secondary | ICD-10-CM | POA: Diagnosis present

## 2021-02-28 DIAGNOSIS — M5416 Radiculopathy, lumbar region: Secondary | ICD-10-CM | POA: Diagnosis present

## 2021-02-28 DIAGNOSIS — R1031 Right lower quadrant pain: Secondary | ICD-10-CM | POA: Diagnosis present

## 2021-02-28 DIAGNOSIS — M5432 Sciatica, left side: Secondary | ICD-10-CM | POA: Insufficient documentation

## 2021-02-28 DIAGNOSIS — R52 Pain, unspecified: Secondary | ICD-10-CM | POA: Diagnosis present

## 2021-02-28 DIAGNOSIS — G894 Chronic pain syndrome: Secondary | ICD-10-CM | POA: Diagnosis present

## 2021-02-28 DIAGNOSIS — M5136 Other intervertebral disc degeneration, lumbar region: Secondary | ICD-10-CM | POA: Diagnosis present

## 2021-02-28 MED ORDER — LIDOCAINE HCL (PF) 1 % IJ SOLN
5.0000 mL | Freq: Once | INTRAMUSCULAR | Status: AC
  Administered 2021-02-28: 5 mL via SUBCUTANEOUS

## 2021-02-28 MED ORDER — TRIAMCINOLONE ACETONIDE 40 MG/ML IJ SUSP
INTRAMUSCULAR | Status: AC
  Filled 2021-02-28: qty 1

## 2021-02-28 MED ORDER — ROPIVACAINE HCL 2 MG/ML IJ SOLN
INTRAMUSCULAR | Status: AC
  Filled 2021-02-28: qty 20

## 2021-02-28 MED ORDER — SODIUM CHLORIDE (PF) 0.9 % IJ SOLN
INTRAMUSCULAR | Status: AC
  Filled 2021-02-28: qty 10

## 2021-02-28 MED ORDER — ROPIVACAINE HCL 2 MG/ML IJ SOLN
10.0000 mL | Freq: Once | INTRAMUSCULAR | Status: AC
  Administered 2021-02-28: 10 mL via EPIDURAL

## 2021-02-28 MED ORDER — SODIUM CHLORIDE 0.9% FLUSH
10.0000 mL | Freq: Once | INTRAVENOUS | Status: AC
  Administered 2021-02-28: 10 mL

## 2021-02-28 MED ORDER — TRIAMCINOLONE ACETONIDE 40 MG/ML IJ SUSP
40.0000 mg | Freq: Once | INTRAMUSCULAR | Status: AC
  Administered 2021-02-28: 40 mg

## 2021-02-28 MED ORDER — LIDOCAINE HCL (PF) 1 % IJ SOLN
INTRAMUSCULAR | Status: AC
  Filled 2021-02-28: qty 10

## 2021-02-28 MED ORDER — IOPAMIDOL (ISOVUE-M 200) INJECTION 41%
20.0000 mL | Freq: Once | INTRAMUSCULAR | Status: DC | PRN
  Administered 2021-02-28: 20 mL

## 2021-02-28 MED ORDER — IOHEXOL 180 MG/ML  SOLN
INTRAMUSCULAR | Status: AC
  Filled 2021-02-28: qty 10

## 2021-02-28 MED ORDER — HYDROCODONE-ACETAMINOPHEN 5-325 MG PO TABS: 1.0000 | ORAL_TABLET | Freq: Two times a day (BID) | ORAL | 0 refills | Status: AC

## 2021-02-28 NOTE — Progress Notes (Signed)
Subjective:  Patient ID: Nicholas [name], male    DOB: October 21, 1945  Age: 76 y.o. MRN: [id number]  CC: Abdominal Pain (RLQ)   Procedure: L5-S1 epidural steroid under fluoroscopic guidance with no sedation  HPI Nicholas [name] presents for reevaluation.  Nicholas [name] continues to have bilateral lower extremity sciatica symptoms.  This is generally worse on the right side than left.  He has intermittent epidural steroid injections to help with the sciatica symptoms and keep his back pain under control.  He presents today for repeat injection.  His last injection was back in December.  This did significantly help with his right groin radiating pain he reports.  Otherwise he seems to be making good progress and desires to proceed with a repeat injection today.  He still using his hydrocodone approximately twice a day effectively.  He derives good functional benefit with no side effects reported and its enabling him to stay active walking and playing some occasional golf.  No change in lower extremity strength or function is noted.  Outpatient Medications Prior to Visit  Medication Sig Dispense Refill   acetaminophen (TYLENOL) 650 MG CR tablet Take 650 mg by mouth every 8 (eight) hours as needed for pain.     apixaban (ELIQUIS) 5 MG TABS tablet Take 1 tablet (5 mg total) by mouth 2 (two) times daily. 180 tablet 1   Ascorbic Acid (VITAMIN C) 1000 MG tablet Take 1,000 mg by mouth daily.     Cranberry 500 MG CAPS Take 500 mg by mouth daily.     gabapentin (NEURONTIN) 600 MG tablet TAKE 1 TABLET BY MOUTH EVERYDAY AT BEDTIME 90 tablet 1   [START ON 03/23/2021] HYDROcodone-acetaminophen (NORCO/VICODIN) 5-325 MG tablet Take 1 tablet by mouth 2 (two) times daily. 60 tablet 0   LORazepam (ATIVAN) 1 MG tablet TAKE 1 TABLET BY MOUTH AT BEDTIME. 90 tablet 0   Magnesium 400 MG TABS Take 400 mg by mouth daily.      MELATONIN PO Take by mouth.     midodrine (PROAMATINE) 10 MG tablet TAKE 1 TABLET BY MOUTH THREE TIMES A DAY 270  tablet 3   Multiple Vitamin (MULTIVITAMIN WITH MINERALS) TABS tablet Take 1 tablet by mouth daily. Centrum Silver     Omega-3 Fatty Acids (FISH OIL) 1200 MG CAPS Take 1,200 mg by mouth daily.     oxymetazoline (AFRIN) 0.05 % nasal spray Place 2 sprays into both nostrils at bedtime.     pantoprazole (PROTONIX) 20 MG tablet TAKE 2 TABLETS (40 MG TOTAL) BY MOUTH DAILY. (Patient taking differently: Take 20 mg by mouth daily.) 90 tablet 4   potassium chloride SA (KLOR-CON) 20 MEQ tablet Take 2 tablets (40 meq) x 1 dose as directed (Patient taking differently: Take 40 mEq by mouth daily as needed (with torsemide (fluid retention)).) 10 tablet 0   QUEtiapine Fumarate (SEROQUEL XR) 150 MG 24 hr tablet TAKE 1 TABLET (150 MG TOTAL) BY MOUTH AT BEDTIME. 90 tablet 4   rOPINIRole (REQUIP) 0.25 MG tablet Take 0.25 mg by mouth 4 (four) times daily.     rosuvastatin (CRESTOR) 40 MG tablet TAKE 1 TABLET BY MOUTH EVERY DAY 90 tablet 1   Semaglutide,0.25 or 0.5MG /DOS, (OZEMPIC, 0.25 OR 0.5 MG/DOSE,) 2 MG/1.5ML SOPN Start with 0.25 MG into skin once a week x 4 weeks, then increase to 0.5 MG skin weekly. 1.5 mL 12   solifenacin (VESICARE) 10 MG tablet TAKE 1 TABLET BY MOUTH EVERY DAY 90 tablet 1  tamsulosin (FLOMAX) 0.4 MG CAPS capsule TAKE 1 CAPSULE BY MOUTH EVERY DAY AFTER SUPPER 90 capsule 4   tiZANidine (ZANAFLEX) 4 MG tablet Take 1 tablet (4 mg total) by mouth every 6 (six) hours as needed for muscle spasms. 90 tablet 4   torsemide (DEMADEX) 20 MG tablet Take 40 mg by mouth daily as needed (fluid retention).     venlafaxine XR (EFFEXOR XR) 75 MG 24 hr capsule Take 3 capsules (225 mg total) by mouth daily. 270 capsule 4   HYDROcodone-acetaminophen (NORCO/VICODIN) 5-325 MG tablet Take 1 tablet by mouth 2 (two) times daily. 60 tablet 0   No facility-administered medications prior to visit.    Review of Systems CNS: No confusion or sedation Cardiac: No angina or palpitations GI: No abdominal pain or  constipation Constitutional: No nausea vomiting fevers or chills  Objective:  BP (!) 144/91    Pulse 82    Temp 98.7 F (37.1 C) (Temporal)    Resp 18    Ht 5\' 10"  (1.778 m)    Wt 282 lb (127.9 kg)    SpO2 95%    BMI 40.46 kg/m    BP Readings from Last 3 Encounters:  02/28/21 (!) 144/91  02/21/21 137/84  01/12/21 (!) 162/87     Wt Readings from Last 3 Encounters:  02/28/21 282 lb (127.9 kg)  02/21/21 282 lb 12.8 oz (128.3 kg)  01/12/21 282 lb (127.9 kg)     Physical Exam Pt is alert and oriented PERRL EOMI HEART IS RRR no murmur or rub LCTA no wheezing or rales MUSCULOSKELETAL reveals some paraspinous muscle tenderness but no overt trigger points.  His lower extremity strength and function is at baseline.  Labs  Lab Results  Component Value Date   HGBA1C 6.6 (H) 02/21/2021   HGBA1C 6.0 (H) 11/16/2020   HGBA1C 6.5 08/11/2020   Lab Results  Component Value Date   MICROALBUR 150 (H) 08/11/2020   LDLCALC 67 02/21/2021   CREATININE 1.14 02/21/2021    -------------------------------------------------------------------------------------------------------------------- Lab Results  Component Value Date   WBC 7.4 02/21/2021   HGB 16.0 02/21/2021   HCT 47.9 02/21/2021   PLT 200 02/21/2021   GLUCOSE 189 (H) 02/21/2021   CHOL 154 02/21/2021   TRIG 127 02/21/2021   HDL 65 02/21/2021   LDLCALC 67 02/21/2021   ALT 24 02/21/2021   AST 26 02/21/2021   NA 139 02/21/2021   K 4.1 02/21/2021   CL 100 02/21/2021   CREATININE 1.14 02/21/2021   BUN 12 02/21/2021   CO2 25 02/21/2021   TSH 1.420 02/21/2021   INR 1.2 02/16/2014   HGBA1C 6.6 (H) 02/21/2021   MICROALBUR 150 (H) 08/11/2020    --------------------------------------------------------------------------------------------------------------------- DG PAIN CLINIC C-ARM 1-60 MIN NO REPORT  Result Date: 02/28/2021 Fluoro was used, but no Radiologist interpretation will be provided. Please refer to "NOTES" tab for  provider progress note.    Assessment & Plan:   Nicholas [name] was seen today for abdominal pain.  Diagnoses and all orders for this visit:  Chronic, continuous use of opioids  Lumbar radiculopathy -     Lumbar Epidural Injection -     Lumbar Epidural Injection; Future  Degeneration of lumbar intervertebral disc -     Lumbar Epidural Injection -     Lumbar Epidural Injection; Future  Bilateral sciatica -     Lumbar Epidural Injection -     Lumbar Epidural Injection; Future  Abdominal pain, chronic, right lower quadrant  Chronic pain syndrome  Other orders -     HYDROcodone-acetaminophen (NORCO/VICODIN) 5-325 MG tablet; Take 1 tablet by mouth 2 (two) times daily. -     triamcinolone acetonide (KENALOG-40) injection 40 mg -     sodium chloride flush (NS) 0.9 % injection 10 mL -     ropivacaine (PF) 2 mg/mL (0.2%) (NAROPIN) injection 10 mL -     lidocaine (PF) (XYLOCAINE) 1 % injection 5 mL -     iopamidol (ISOVUE-M) 41 % intrathecal injection 20 mL        ----------------------------------------------------------------------------------------------------------------------  Problem List Items Addressed This Visit       Unprioritized   Chronic pain syndrome (Chronic)   Relevant Medications   HYDROcodone-acetaminophen (NORCO/VICODIN) 5-325 MG tablet (Start on 04/22/2021)   Abdominal pain, chronic, right lower quadrant   Relevant Medications   HYDROcodone-acetaminophen (NORCO/VICODIN) 5-325 MG tablet (Start on 04/22/2021)   Chronic, continuous use of opioids - Primary   Lumbar radiculopathy   Relevant Orders   Lumbar Epidural Injection   Other Visit Diagnoses     Degeneration of lumbar intervertebral disc       Relevant Medications   HYDROcodone-acetaminophen (NORCO/VICODIN) 5-325 MG tablet (Start on 04/22/2021)   triamcinolone acetonide (KENALOG-40) injection 40 mg (Completed)   Other Relevant Orders   Lumbar Epidural Injection   Bilateral sciatica       Relevant Orders    Lumbar Epidural Injection         ----------------------------------------------------------------------------------------------------------------------  1. Lumbar radiculopathy We will proceed with a repeat epidural today.  He seems to be making good progress and this did help with his right inguinal groin pain.  His low back pain is also responding as is his sciatica.  He desires to proceed with a repeat injection.  We have gone over the risk and benefits once again.  All questions are answered we will schedule him for a 5-month return to clinic with a possible repeat epidural at that time if indicated. - Lumbar Epidural Injection - Lumbar Epidural Injection; Future  2. Degeneration of lumbar intervertebral disc As above - Lumbar Epidural Injection - Lumbar Epidural Injection; Future  3. Bilateral sciatica As above - Lumbar Epidural Injection - Lumbar Epidural Injection; Future  4. Chronic, continuous use of opioids He seems to be doing well with chronic opioid therapy and continues to derive good functional benefit.  No changes will be made with refills available for March 1 with return to clinic in 2 months  5. Abdominal pain, chronic, right lower quadrant As above  6. Chronic pain syndrome     ----------------------------------------------------------------------------------------------------------------------  I am having Nicholas [name] "Tommy" maintain his oxymetazoline, Fish Oil, Cranberry, Magnesium, acetaminophen, potassium chloride SA, torsemide, QUEtiapine Fumarate, rOPINIRole, vitamin C, multivitamin with minerals, midodrine, venlafaxine XR, pantoprazole, apixaban, MELATONIN PO, tiZANidine, LORazepam, gabapentin, solifenacin, tamsulosin, rosuvastatin, HYDROcodone-acetaminophen, Ozempic (0.25 or 0.5 MG/DOSE), and HYDROcodone-acetaminophen. We administered triamcinolone acetonide, sodium chloride flush, ropivacaine (PF) 2 mg/mL (0.2%), lidocaine (PF), and  iopamidol.   Meds ordered this encounter  Medications   HYDROcodone-acetaminophen (NORCO/VICODIN) 5-325 MG tablet    Sig: Take 1 tablet by mouth 2 (two) times daily.    Dispense:  60 tablet    Refill:  0   triamcinolone acetonide (KENALOG-40) injection 40 mg   sodium chloride flush (NS) 0.9 % injection 10 mL   ropivacaine (PF) 2 mg/mL (0.2%) (NAROPIN) injection 10 mL   lidocaine (PF) (XYLOCAINE) 1 % injection 5 mL   iopamidol (ISOVUE-M) 41 % intrathecal injection 20 mL  Patient's Medications  New Prescriptions   No medications on file  Previous Medications   ACETAMINOPHEN (TYLENOL) 650 MG CR TABLET    Take 650 mg by mouth every 8 (eight) hours as needed for pain.   APIXABAN (ELIQUIS) 5 MG TABS TABLET    Take 1 tablet (5 mg total) by mouth 2 (two) times daily.   ASCORBIC ACID (VITAMIN C) 1000 MG TABLET    Take 1,000 mg by mouth daily.   CRANBERRY 500 MG CAPS    Take 500 mg by mouth daily.   GABAPENTIN (NEURONTIN) 600 MG TABLET    TAKE 1 TABLET BY MOUTH EVERYDAY AT BEDTIME   HYDROCODONE-ACETAMINOPHEN (NORCO/VICODIN) 5-325 MG TABLET    Take 1 tablet by mouth 2 (two) times daily.   LORAZEPAM (ATIVAN) 1 MG TABLET    TAKE 1 TABLET BY MOUTH AT BEDTIME.   MAGNESIUM 400 MG TABS    Take 400 mg by mouth daily.    MELATONIN PO    Take by mouth.   MIDODRINE (PROAMATINE) 10 MG TABLET    TAKE 1 TABLET BY MOUTH THREE TIMES A DAY   MULTIPLE VITAMIN (MULTIVITAMIN WITH MINERALS) TABS TABLET    Take 1 tablet by mouth daily. Centrum Silver   OMEGA-3 FATTY ACIDS (FISH OIL) 1200 MG CAPS    Take 1,200 mg by mouth daily.   OXYMETAZOLINE (AFRIN) 0.05 % NASAL SPRAY    Place 2 sprays into both nostrils at bedtime.   PANTOPRAZOLE (PROTONIX) 20 MG TABLET    TAKE 2 TABLETS (40 MG TOTAL) BY MOUTH DAILY.   POTASSIUM CHLORIDE SA (KLOR-CON) 20 MEQ TABLET    Take 2 tablets (40 meq) x 1 dose as directed   QUETIAPINE FUMARATE (SEROQUEL XR) 150 MG 24 HR TABLET    TAKE 1 TABLET (150 MG TOTAL) BY MOUTH AT BEDTIME.    ROPINIROLE (REQUIP) 0.25 MG TABLET    Take 0.25 mg by mouth 4 (four) times daily.   ROSUVASTATIN (CRESTOR) 40 MG TABLET    TAKE 1 TABLET BY MOUTH EVERY DAY   SEMAGLUTIDE,0.25 OR 0.5MG /DOS, (OZEMPIC, 0.25 OR 0.5 MG/DOSE,) 2 MG/1.5ML SOPN    Start with 0.25 MG into skin once a week x 4 weeks, then increase to 0.5 MG skin weekly.   SOLIFENACIN (VESICARE) 10 MG TABLET    TAKE 1 TABLET BY MOUTH EVERY DAY   TAMSULOSIN (FLOMAX) 0.4 MG CAPS CAPSULE    TAKE 1 CAPSULE BY MOUTH EVERY DAY AFTER SUPPER   TIZANIDINE (ZANAFLEX) 4 MG TABLET    Take 1 tablet (4 mg total) by mouth every 6 (six) hours as needed for muscle spasms.   TORSEMIDE (DEMADEX) 20 MG TABLET    Take 40 mg by mouth daily as needed (fluid retention).   VENLAFAXINE XR (EFFEXOR XR) 75 MG 24 HR CAPSULE    Take 3 capsules (225 mg total) by mouth daily.  Modified Medications   Modified Medication Previous Medication   HYDROCODONE-ACETAMINOPHEN (NORCO/VICODIN) 5-325 MG TABLET HYDROcodone-acetaminophen (NORCO/VICODIN) 5-325 MG tablet      Take 1 tablet by mouth 2 (two) times daily.    Take 1 tablet by mouth 2 (two) times daily.  Discontinued Medications   No medications on file   ----------------------------------------------------------------------------------------------------------------------  Follow-up: Return in about 6 weeks (around 04/11/2021) for evaluation, procedure, med refill.  As above Procedure: L5-S1 LESI with fluoroscopic guidance and no moderate sedation  NOTE: The risks, benefits, and expectations of the procedure have been discussed and explained to the patient who  was understanding and in agreement with suggested treatment plan. No guarantees were made.  DESCRIPTION OF PROCEDURE: Lumbar epidural steroid injection with no IV Versed, EKG, blood pressure, pulse, and pulse oximetry monitoring. The procedure was performed with the patient in the prone position under fluoroscopic guidance.  Sterile prep x3 was initiated and I then  injected subcutaneous lidocaine to the overlying L5-S1 site after its fluoroscopic identifictation.  Using strict aseptic technique, I then advanced an 18-gauge Tuohy epidural needle in the midline using interlaminar approach via loss-of-resistance to saline technique. There was negative aspiration for heme or  CSF.  I then confirmed position with both AP and Lateral fluoroscan.  2 cc of contrast dye were injected and a  total of 5 mL of Preservative-Free normal saline mixed with 40 mg of Kenalog and 1cc Ropicaine 0.2 percent were injected incrementally via the  epidurally placed needle. The needle was removed. The patient tolerated the injection well and was convalesced and discharged to home in stable condition. Should the patient have any post procedure difficulty they have been instructed on how to contact us for assistance.    [name], MD

## 2021-02-28 NOTE — Patient Instructions (Signed)
Pain Management Discharge Instructions  General Discharge Instructions :  If you need to reach your doctor call: Monday-Friday 8:00 am - 4:00 pm at [phone] or toll free [phone].  After clinic hours [phone] to have operator reach doctor.  Bring all of your medication bottles to all your appointments in the pain clinic.  To cancel or reschedule your appointment with Pain Management please remember to call 24 hours in advance to avoid a fee.  Refer to the educational materials which you have been given on: General Risks, I had my Procedure. Discharge Instructions, Post Sedation.  Post Procedure Instructions:  The drugs you were given will stay in your system until tomorrow, so for the next 24 hours you should not drive, make any legal decisions or drink any alcoholic beverages.  You may eat anything you prefer, but it is better to start with liquids then soups and crackers, and gradually work up to solid foods.  Please notify your doctor immediately if you have any unusual bleeding, trouble breathing or pain that is not related to your normal pain.  Depending on the type of procedure that was done, some parts of your body may feel week and/or numb.  This usually clears up by tonight or the next day.  Walk with the use of an assistive device or accompanied by an adult for the 24 hours.  You may use ice on the affected area for the first 24 hours.  Put ice in a Ziploc bag and cover with a towel and place against area 15 minutes on 15 minutes off.  You may switch to heat after 24 hours.Epidural Steroid Injection Patient Information  Description: The epidural space surrounds the nerves as they exit the spinal cord.  In some patients, the nerves can be compressed and inflamed by a bulging disc or a tight spinal canal (spinal stenosis).  By injecting steroids into the epidural space, we can bring irritated nerves into direct contact with a potentially helpful medication.  These  steroids act directly on the irritated nerves and can reduce swelling and inflammation which often leads to decreased pain.  Epidural steroids may be injected anywhere along the spine and from the neck to the low back depending upon the location of your pain.   After numbing the skin with local anesthetic (like Novocaine), a small needle is passed into the epidural space slowly.  You may experience a sensation of pressure while this is being done.  The entire block usually last less than 10 minutes.  Conditions which may be treated by epidural steroids:  Low back and leg pain Neck and arm pain Spinal stenosis Post-laminectomy syndrome Herpes zoster (shingles) pain Pain from compression fractures  Preparation for the injection:  Do not eat any solid food or dairy products within 8 hours of your appointment.  You may drink clear liquids up to 3 hours before appointment.  Clear liquids include water, black coffee, juice or soda.  No milk or cream please. You may take your regular medication, including pain medications, with a sip of water before your appointment  Diabetics should hold regular insulin (if taken separately) and take 1/2 normal NPH dos the morning of the procedure.  Carry some sugar containing items with you to your appointment. A driver must accompany you and be prepared to drive you home after your procedure.  Bring all your current medications with your. An IV may be inserted and sedation may be given at the discretion of the physician.     A blood pressure cuff, EKG and other monitors will often be applied during the procedure.  Some patients may need to have extra oxygen administered for a short period. You will be asked to provide medical information, including your allergies, prior to the procedure.  We must know immediately if you are taking blood thinners (like Coumadin/Warfarin)  Or if you are allergic to IV iodine contrast (dye). We must know if you could possible be  pregnant.  Possible side-effects: Bleeding from needle site Infection (rare, may require surgery) Nerve injury (rare) Numbness & tingling (temporary) Difficulty urinating (rare, temporary) Spinal headache ( a headache worse with upright posture) Light -headedness (temporary) Pain at injection site (several days) Decreased blood pressure (temporary) Weakness in arm/leg (temporary) Pressure sensation in back/neck (temporary)  Call if you experience: Fever/chills associated with headache or increased back/neck pain. Headache worsened by an upright position. New onset weakness or numbness of an extremity below the injection site Hives or difficulty breathing (go to the emergency room) Inflammation or drainage at the infection site Severe back/neck pain Any new symptoms which are concerning to you  Please note:  Although the local anesthetic injected can often make your back or neck feel good for several hours after the injection, the pain will likely return.  It takes 3-7 days for steroids to work in the epidural space.  You may not notice any pain relief for at least that one week.  If effective, we will often do a series of three injections spaced 3-6 weeks apart to maximally decrease your pain.  After the initial series, we generally will wait several months before considering a repeat injection of the same type.  If you have any questions, please call [phone] [facility] 

## 2021-03-01 ENCOUNTER — Telehealth: Payer: Self-pay | Admitting: *Deleted

## 2021-03-01 NOTE — Telephone Encounter (Signed)
Called for post procedure check. Denies any problems overnight.

## 2021-03-09 ENCOUNTER — Other Ambulatory Visit: Payer: Self-pay | Admitting: Nurse Practitioner

## 2021-03-09 NOTE — Telephone Encounter (Signed)
Requested Prescriptions  Pending Prescriptions Disp Refills   pantoprazole (PROTONIX) 20 MG tablet [Pharmacy Med Name: PANTOPRAZOLE SOD DR 20 MG TAB] 180 tablet 1    Sig: TAKE 2 TABLETS (40 MG TOTAL) BY MOUTH DAILY.     Gastroenterology: Proton Pump Inhibitors Passed - 03/09/2021  2:47 AM      Passed - Valid encounter within last 12 months    Recent Outpatient Visits          2 weeks ago Type 2 diabetes mellitus with obesity ([place])   [place], [place] T, NP   2 months ago Abdominal pain, chronic, right lower quadrant   [place], Eldridge T, NP   3 months ago Morbid obesity ([place])   [place] Cannady, [name], NP   4 months ago Need for influenza vaccination   [place], Lauren A, NP   5 months ago Right lower quadrant abdominal pain   [place], Barbaraann Faster, NP      Future Appointments            In 2 weeks Cannady, Barbaraann Faster, NP [employer], [facility]   In 2 weeks Turner, Eber Hong, MD [place], [facility]   In 1 month  [employer], [place]

## 2021-03-14 ENCOUNTER — Ambulatory Visit (INDEPENDENT_AMBULATORY_CARE_PROVIDER_SITE_OTHER): Payer: Medicare Other

## 2021-03-14 DIAGNOSIS — I442 Atrioventricular block, complete: Secondary | ICD-10-CM | POA: Diagnosis not present

## 2021-03-14 LAB — CUP PACEART REMOTE DEVICE CHECK
Battery Remaining Longevity: 30 mo
Battery Voltage: 2.96 V
Brady Statistic AP VP Percent: 39.13 %
Brady Statistic AP VS Percent: 0.01 %
Brady Statistic AS VP Percent: 60.78 %
Brady Statistic AS VS Percent: 0.09 %
Brady Statistic RA Percent Paced: 39 %
Brady Statistic RV Percent Paced: 99.89 %
Date Time Interrogation Session: 20230220083418
Implantable Lead Implant Date: 20180124
Implantable Lead Implant Date: 20180124
Implantable Lead Location: 753859
Implantable Lead Location: 753860
Implantable Lead Model: 3830
Implantable Lead Model: 5076
Implantable Pulse Generator Implant Date: 20180124
Lead Channel Impedance Value: 342 Ohm
Lead Channel Impedance Value: 342 Ohm
Lead Channel Impedance Value: 380 Ohm
Lead Channel Impedance Value: 513 Ohm
Lead Channel Pacing Threshold Amplitude: 0.5 V
Lead Channel Pacing Threshold Amplitude: 0.75 V
Lead Channel Pacing Threshold Pulse Width: 0.4 ms
Lead Channel Pacing Threshold Pulse Width: 0.4 ms
Lead Channel Sensing Intrinsic Amplitude: 1.5 mV
Lead Channel Sensing Intrinsic Amplitude: 1.5 mV
Lead Channel Sensing Intrinsic Amplitude: 3.25 mV
Lead Channel Sensing Intrinsic Amplitude: 3.25 mV
Lead Channel Setting Pacing Amplitude: 2 V
Lead Channel Setting Pacing Amplitude: 2.25 V
Lead Channel Setting Pacing Pulse Width: 1 ms
Lead Channel Setting Sensing Sensitivity: 0.6 mV

## 2021-03-20 NOTE — Patient Instructions (Signed)

## 2021-03-21 NOTE — Progress Notes (Signed)
Remote pacemaker transmission.   

## 2021-03-22 ENCOUNTER — Telehealth: Payer: Self-pay

## 2021-03-22 ENCOUNTER — Other Ambulatory Visit: Payer: Self-pay | Admitting: Student in an Organized Health Care Education/Training Program

## 2021-03-22 MED ORDER — HYDROCODONE-ACETAMINOPHEN 5-325 MG PO TABS: 1.0000 | ORAL_TABLET | Freq: Two times a day (BID) | ORAL | 0 refills | Status: DC

## 2021-03-22 NOTE — Telephone Encounter (Signed)
His pharmacy doesn't have his hydrocodone and he wants to know if the script can be sent to [facility] in [place].

## 2021-03-22 NOTE — Telephone Encounter (Signed)
Dr. Holley Raring sent in new script for hydrocodone to [facility] and the one at the [facility] was discontinued. Patient called and made aware.

## 2021-03-22 NOTE — Telephone Encounter (Signed)
Dr. Andree Elk is on vacation. Patient called and I explained that I would ask one of the other providers to send script to [facility] in [place]. Bubble sent to Dr. Holley Raring.

## 2021-03-23 ENCOUNTER — Encounter: Payer: Self-pay | Admitting: Nurse Practitioner

## 2021-03-23 ENCOUNTER — Other Ambulatory Visit: Payer: Self-pay

## 2021-03-23 ENCOUNTER — Ambulatory Visit (INDEPENDENT_AMBULATORY_CARE_PROVIDER_SITE_OTHER): Payer: Medicare Other | Admitting: Nurse Practitioner

## 2021-03-23 VITALS — BP 118/82 | HR 83 | Temp 98.0°F | Ht 70.0 in | Wt 284.2 lb

## 2021-03-23 DIAGNOSIS — G8929 Other chronic pain: Secondary | ICD-10-CM

## 2021-03-23 DIAGNOSIS — E1159 Type 2 diabetes mellitus with other circulatory complications: Secondary | ICD-10-CM | POA: Diagnosis not present

## 2021-03-23 DIAGNOSIS — R1031 Right lower quadrant pain: Secondary | ICD-10-CM

## 2021-03-23 DIAGNOSIS — E1169 Type 2 diabetes mellitus with other specified complication: Secondary | ICD-10-CM | POA: Diagnosis not present

## 2021-03-23 DIAGNOSIS — N1831 Chronic kidney disease, stage 3a: Secondary | ICD-10-CM

## 2021-03-23 DIAGNOSIS — E669 Obesity, unspecified: Secondary | ICD-10-CM

## 2021-03-23 DIAGNOSIS — R8281 Pyuria: Secondary | ICD-10-CM

## 2021-03-23 DIAGNOSIS — I152 Hypertension secondary to endocrine disorders: Secondary | ICD-10-CM

## 2021-03-23 LAB — MICROSCOPIC EXAMINATION: RBC, Urine: NONE SEEN /hpf (ref 0–2)

## 2021-03-23 LAB — URINALYSIS, ROUTINE W REFLEX MICROSCOPIC
Bilirubin, UA: NEGATIVE
Glucose, UA: NEGATIVE
Ketones, UA: NEGATIVE
Nitrite, UA: NEGATIVE
Protein,UA: NEGATIVE
RBC, UA: NEGATIVE
Specific Gravity, UA: 1.02 (ref 1.005–1.030)
Urobilinogen, Ur: 1 mg/dL (ref 0.2–1.0)
pH, UA: 6 (ref 5.0–7.5)

## 2021-03-23 LAB — MICROALBUMIN, URINE WAIVED
Creatinine, Urine Waived: 200 mg/dL (ref 10–300)
Microalb, Ur Waived: 80 mg/L — ABNORMAL HIGH (ref 0–19)
Microalb/Creat Ratio: <30 mg/g (ref <30)

## 2021-03-23 MED ORDER — GLUCOSE BLOOD VI STRP: ORAL_STRIP | 12 refills | Status: AC

## 2021-03-23 MED ORDER — ONETOUCH VERIO W/DEVICE KIT: PACK | 0 refills | Status: AC

## 2021-03-23 MED ORDER — ONETOUCH ULTRASOFT LANCETS MISC: 12 refills | Status: AC

## 2021-03-23 NOTE — Progress Notes (Addendum)
BP 118/82 (BP Location: Left Arm, Cuff Size: Normal)    Pulse 83    Temp 98 F (36.7 C) (Oral)    Ht 5\' 10"  (1.778 m)    Wt 284 lb 3.2 oz (128.9 kg)    SpO2 97%    BMI 40.78 kg/m    Subjective:    Patient ID: Nicholas [name], male    DOB: [birth year], 76 y.o.   MRN: [id number]  HPI: Nicholas [name] is a 76 y.o. male  Chief Complaint  Patient presents with   Abdominal Pain    Patient states he is still having pain in his lower right side. Patient states the sharp pains come and go, but he also has an ongoing dull pain in his right lower side.    Diabetes    Patient declines having a recent Diabetic Eye Exam.    DIABETES Started on Ozempic recent visit on 02/21/21 for A1c 6.6% and ongoing fluctuation upwards.  Is tolerating well, had one episode of diarrhea with this. Hypoglycemic episodes:no Polydipsia/polyuria: no Visual disturbance: no Chest pain: no Paresthesias: no Glucose Monitoring: no  Accucheck frequency: Not Checking  Fasting glucose:  Post prandial:  Evening:  Before meals: Taking Insulin?: no  Long acting insulin:  Short acting insulin: Blood Pressure Monitoring: not checking Retinal Examination: Up to Date -- October Dr. Ellin Mayhew Foot Exam: Up to Date Pneumovax: Up to Date Influenza: Up to Date Aspirin: no   ABDOMINAL PAIN (RLQ) Chronic issue, has had extensive work-up.  CT in August noted possible stone in right kidney -- he saw Dr. Caryl Pina with urology on 10/05/20 and stone was not suspected.  Renal ultrasound done which was normal.  He saw general surgery on 11/04/20 and he is to attend PT + plus they recommended he visit GI, which he did and had a recent colonoscopy -- he cleaned for two days.  He reports Dr. Sabra Heck told him it is not the right hip causing issues. Continues Protonix for GERD.  His pain provider recommended trying a shot, Dr. Andree Elk.  Pain at times radiates around to back.  He has had two injections, 1st injection helped, but second not as much.  Goes  for 3rd one March 20th.  Reports showed no acute findings and GI recommended Miralax or Linzess -- he does have BM every other day and texture is softer, no straining -- Type 4 on stool chart.  He does take Miralax once to twice a week, not daily. Duration:months Onset: gradual Severity: 7/10 at worst -- if moves getting out of car or in car, rolling in bed make it worse Quality: sharp, aching, and throbbing Location:  RLQ  Episode duration:  Radiation: no Frequency: intermittent Alleviating factors:  Aggravating factors: Status: worse Treatments attempted:  ice, heat, TENS, and opioids, injections Fever: no Nausea: no Vomiting: no Weight loss: no Decreased appetite: no Diarrhea: no Constipation: no Blood in stool: no Heartburn: no Jaundice: no Rash: no Dysuria/urinary frequency: no Hematuria: no History of sexually transmitted disease: no Recurrent NSAID use: no    Relevant past medical, surgical, family and social history reviewed and updated as indicated. Interim medical history since our last visit reviewed. Allergies and medications reviewed and updated.  Review of Systems  Constitutional:  Negative for activity change, appetite change, fatigue and fever.  Respiratory:  Negative for cough, chest tightness, shortness of breath and wheezing.   Cardiovascular:  Negative for chest pain, palpitations and leg swelling.  Gastrointestinal:  Positive for  abdominal pain. Negative for abdominal distention, blood in stool, constipation, diarrhea, nausea and vomiting.  Endocrine: Negative for polydipsia, polyphagia and polyuria.  Musculoskeletal:  Positive for arthralgias.  Neurological: Negative.   Psychiatric/Behavioral:  Negative for decreased concentration, self-injury, sleep disturbance and suicidal ideas. The patient is not nervous/anxious.    Per HPI unless specifically indicated above     Objective:    BP 118/82 (BP Location: Left Arm, Cuff Size: Normal)    Pulse 83     Temp 98 F (36.7 C) (Oral)    Ht 5\' 10"  (1.778 m)    Wt 284 lb 3.2 oz (128.9 kg)    SpO2 97%    BMI 40.78 kg/m   Wt Readings from Last 3 Encounters:  03/23/21 284 lb 3.2 oz (128.9 kg)  02/28/21 282 lb (127.9 kg)  02/21/21 282 lb 12.8 oz (128.3 kg)    Physical Exam Vitals and nursing note reviewed.  Constitutional:      General: He is awake. He is not in acute distress.    Appearance: He is well-developed and well-groomed. He is obese.  HENT:     Head: Normocephalic and atraumatic.     Right Ear: Hearing normal. No drainage.     Left Ear: Hearing normal. No drainage.  Eyes:     General: Lids are normal.        Right eye: No discharge.        Left eye: No discharge.     Conjunctiva/sclera: Conjunctivae normal.     Pupils: Pupils are equal, round, and reactive to light.  Neck:     Thyroid: No thyromegaly.     Vascular: No carotid bruit.  Cardiovascular:     Rate and Rhythm: Normal rate and regular rhythm.     Heart sounds: Normal heart sounds, S1 normal and S2 normal. No murmur heard.   No gallop.  Pulmonary:     Effort: Pulmonary effort is normal. No accessory muscle usage or respiratory distress.     Breath sounds: Normal breath sounds.  Abdominal:     General: Bowel sounds are normal. There is no distension.     Palpations: Abdomen is soft. There is no hepatomegaly.     Tenderness: There is abdominal tenderness in the right lower quadrant. There is no right CVA tenderness, left CVA tenderness, guarding or rebound. Negative signs include psoas sign and obturator sign.  Musculoskeletal:        General: Normal range of motion.     Cervical back: Normal range of motion and neck supple.     Right lower leg: No edema.     Left lower leg: No edema.  Skin:    General: Skin is warm and dry.  Neurological:     Mental Status: He is alert and oriented to person, place, and time.  Psychiatric:        Attention and Perception: Attention normal.        Mood and Affect: Mood normal.         Behavior: Behavior normal. Behavior is cooperative.        Thought Content: Thought content normal.   Diabetic Foot Exam - Simple   Simple Foot Form Visual Inspection No deformities, no ulcerations, no other skin breakdown bilaterally: Yes Sensation Testing Intact to touch and monofilament testing bilaterally: Yes Pulse Check Posterior Tibialis and Dorsalis pulse intact bilaterally: Yes Comments      Results for orders placed or performed in visit on 03/14/21  CUP PACEART  REMOTE DEVICE CHECK  Result Value Ref Range   Date Time Interrogation Session [id number]    Pulse Generator Manufacturer MERM    Pulse Gen Model A2DR01 Advisa DR MRI    Pulse Gen Serial Number [id number]    Clinic Name [facility]    Implantable Pulse Generator Type Implantable Pulse Generator    Implantable Pulse Generator Implant Date [id number]    Implantable Lead Manufacturer MERM    Implantable Lead Model 3830 SelectSecure MRI SureScan    Implantable Lead Serial Number [id number]    Implantable Lead Implant Date [id number]    Implantable Lead Location Detail 1 UNKNOWN    Implantable Lead Special Function Bundle of His    Implantable Lead Location [id number]    Implantable Lead Manufacturer MERM    Implantable Lead Model 5076 CapSureFix Novus MRI SureScan    Implantable Lead Serial Number [id number]    Implantable Lead Implant Date [id number]    Implantable Lead Location Detail 1 APPENDAGE    Implantable Lead Location [phone]    Lead Channel Setting Sensing Sensitivity 0.6 mV   Lead Channel Setting Pacing Amplitude 2 V   Lead Channel Setting Pacing Pulse Width 1 ms   Lead Channel Setting Pacing Amplitude 2.25 V   Lead Channel Impedance Value 380 ohm   Lead Channel Impedance Value 342 ohm   Lead Channel Sensing Intrinsic Amplitude 3.25 mV   Lead Channel Sensing Intrinsic Amplitude 3.25 mV   Lead Channel Pacing Threshold Amplitude 0.5 V   Lead Channel Pacing Threshold Pulse Width 0.4 ms   Lead  Channel Impedance Value 513 ohm   Lead Channel Impedance Value 342 ohm   Lead Channel Sensing Intrinsic Amplitude 1.5 mV   Lead Channel Sensing Intrinsic Amplitude 1.5 mV   Lead Channel Pacing Threshold Amplitude 0.75 V   Lead Channel Pacing Threshold Pulse Width 0.4 ms   Battery Status OK    Battery Remaining Longevity 30 mo   Battery Voltage 2.96 V   Brady Statistic RA Percent Paced 39 %   Brady Statistic RV Percent Paced 99.89 %   Brady Statistic AP VP Percent 39.13 %   Brady Statistic AS VP Percent 60.78 %   Brady Statistic AP VS Percent 0.01 %   Brady Statistic AS VS Percent 0.09 %      Assessment & Plan:   Problem List Items Addressed This Visit       Cardiovascular and Mediastinum   Hypertension associated with diabetes ([place])     Endocrine   Type 2 diabetes mellitus with obesity ([place])    New diagnosis on 02/21/21, started on Ozempic and is tolerating well.  To increase to 0.5 MG dosing tomorrow, educated his wife and him on this.  At this time continue current regimen and adjust as needed.  Glucometer supplies sent and instructed on this.  Labs today.  Return in 2 months.        Genitourinary   Stage 3a chronic kidney disease (HCC) (Chronic)     Other   Abdominal pain, chronic, right lower quadrant - Primary    Has had extensive work-up with GI, urology, PCP, and pain management + general surgery.  At this time will continue Tizanidine + collaboration with Dr. Andree Elk with injections appears to radiate from right lower back.  Recommend he get back into GI, Dr. Claybon Jabs who he has seen in past.  Will repeat labs and ultrasound lower right quadrant due to ongoing pain, suspicious for appendix, however all work-up has  been negative for this.      Relevant Orders   Urinalysis, Routine w reflex microscopic   Comprehensive metabolic panel   CBC with Differential/Platelet   Gamma GT   Lipase   Amylase   US Abdomen Limited     Follow up plan: Return in about 2 months  (around 05/23/2021) for T2DM, HTN/HLD, ABD PAIN, ANXIETY, CHRONIC PAIN.

## 2021-03-23 NOTE — Assessment & Plan Note (Signed)
New diagnosis on 02/21/21, started on Ozempic and is tolerating well.  To increase to 0.5 MG dosing tomorrow, educated his wife and him on this.  At this time continue current regimen and adjust as needed.  Glucometer supplies sent and instructed on this.  Labs today.  Return in 2 months. ?

## 2021-03-23 NOTE — Addendum Note (Signed)
Addended by: [name] T on: 03/23/2021 10:23 AM ? ? Modules accepted: Orders ? ?

## 2021-03-23 NOTE — Assessment & Plan Note (Signed)
Has had extensive work-up with GI, urology, PCP, and pain management + general surgery.  At this time will continue Tizanidine + collaboration with Dr. Andree Elk with injections appears to radiate from right lower back.  Recommend he get back into GI, Dr. Claybon Jabs who he has seen in past.  Will repeat labs and ultrasound lower right quadrant due to ongoing pain, suspicious for appendix, however all work-up has been negative for this. ?

## 2021-03-24 LAB — CBC WITH DIFFERENTIAL/PLATELET
Basophils Absolute: 0 10*3/uL (ref 0.0–0.2)
Basos: 1 %
EOS (ABSOLUTE): 0.2 10*3/uL (ref 0.0–0.4)
Eos: 3 %
Hematocrit: 46 % (ref 37.5–51.0)
Hemoglobin: 15.1 g/dL (ref 13.0–17.7)
Immature Grans (Abs): 0 10*3/uL (ref 0.0–0.1)
Immature Granulocytes: 1 %
Lymphocytes Absolute: 1.5 10*3/uL (ref 0.7–3.1)
Lymphs: 24 %
MCH: 30.8 pg (ref 26.6–33.0)
MCHC: 32.8 g/dL (ref 31.5–35.7)
MCV: 94 fL (ref 79–97)
Monocytes Absolute: 0.4 10*3/uL (ref 0.1–0.9)
Monocytes: 7 %
Neutrophils Absolute: 4.1 10*3/uL (ref 1.4–7.0)
Neutrophils: 64 %
Platelets: 172 10*3/uL (ref 150–450)
RBC: 4.9 x10E6/uL (ref 4.14–5.80)
RDW: 13.3 % (ref 11.6–15.4)
WBC: 6.3 10*3/uL (ref 3.4–10.8)

## 2021-03-24 LAB — COMPREHENSIVE METABOLIC PANEL
ALT: 28 IU/L (ref 0–44)
AST: 27 IU/L (ref 0–40)
Albumin/Globulin Ratio: 1.8 (ref 1.2–2.2)
Albumin: 4.5 g/dL (ref 3.7–4.7)
Alkaline Phosphatase: 56 IU/L (ref 44–121)
BUN/Creatinine Ratio: 15 (ref 10–24)
BUN: 15 mg/dL (ref 8–27)
Bilirubin Total: 0.6 mg/dL (ref 0.0–1.2)
CO2: 24 mmol/L (ref 20–29)
Calcium: 9.5 mg/dL (ref 8.6–10.2)
Chloride: 106 mmol/L (ref 96–106)
Creatinine, Ser: 0.98 mg/dL (ref 0.76–1.27)
Globulin, Total: 2.5 g/dL (ref 1.5–4.5)
Glucose: 103 mg/dL — ABNORMAL HIGH (ref 70–99)
Potassium: 4.1 mmol/L (ref 3.5–5.2)
Sodium: 143 mmol/L (ref 134–144)
Total Protein: 7 g/dL (ref 6.0–8.5)
eGFR: 80 mL/min/{1.73_m2} (ref >59)

## 2021-03-24 LAB — GAMMA GT: GGT: 43 IU/L (ref 0–65)

## 2021-03-24 LAB — LIPASE: Lipase: 36 U/L (ref 13–78)

## 2021-03-24 LAB — AMYLASE: Amylase: 55 U/L (ref 31–110)

## 2021-03-24 NOTE — Progress Notes (Signed)
Contacted via [place] -- please make sure patient is aware of message below, call and check: ?Good morning Nicholas [name], your labs have overall returned and are stable.  No concerns on them.  The radiologist has notified me we can not do ultrasound, for you we would need CT scan to assess appendix.  Are you okay with me placing order for CT scan? I wanted you to be aware before placing order.  Let me know.

## 2021-03-25 NOTE — Addendum Note (Signed)
Addended by: [name] T on: 03/25/2021 09:37 AM ? ? Modules accepted: Orders ? ?

## 2021-03-26 LAB — URINE CULTURE

## 2021-03-27 NOTE — Progress Notes (Signed)
Contacted via [place] ? ? ?Good morning Nicholas [name], your urine as returned and no infection noted.  Great news!!!

## 2021-03-28 ENCOUNTER — Other Ambulatory Visit: Payer: Self-pay | Admitting: Nurse Practitioner

## 2021-03-28 DIAGNOSIS — F419 Anxiety disorder, unspecified: Secondary | ICD-10-CM

## 2021-03-28 DIAGNOSIS — F339 Major depressive disorder, recurrent, unspecified: Secondary | ICD-10-CM

## 2021-03-29 ENCOUNTER — Ambulatory Visit: Payer: Medicare Other

## 2021-03-29 ENCOUNTER — Encounter: Payer: Self-pay | Admitting: Cardiology

## 2021-03-29 ENCOUNTER — Telehealth (INDEPENDENT_AMBULATORY_CARE_PROVIDER_SITE_OTHER): Payer: Medicare Other | Admitting: Cardiology

## 2021-03-29 ENCOUNTER — Other Ambulatory Visit: Payer: Self-pay

## 2021-03-29 VITALS — BP 113/70 | HR 104 | Ht 70.0 in | Wt 280.0 lb

## 2021-03-29 DIAGNOSIS — E1159 Type 2 diabetes mellitus with other circulatory complications: Secondary | ICD-10-CM | POA: Diagnosis not present

## 2021-03-29 DIAGNOSIS — G4733 Obstructive sleep apnea (adult) (pediatric): Secondary | ICD-10-CM

## 2021-03-29 DIAGNOSIS — I152 Hypertension secondary to endocrine disorders: Secondary | ICD-10-CM

## 2021-03-29 NOTE — Progress Notes (Signed)
Virtual Visit via Video Note   This visit type was conducted due to national recommendations for restrictions regarding the COVID-19 Pandemic (e.g. social distancing) in an effort to limit this patient's exposure and mitigate transmission in our community.  Due to his co-morbid illnesses, this patient is at least at moderate risk for complications without adequate follow up.  This format is felt to be most appropriate for this patient at this time.  All issues noted in this document were discussed and addressed.  A limited physical exam was performed with this format.  Please refer to the patient's chart for his consent to telehealth for [facility].  Date:  03/29/2021   ID:  Nicholas [name], DOB July 14, 1945, MRN [id number] The patient was identified using 2 identifiers.  Patient Location: Home Provider Location: Home Office   PCP:  Venita Lick, NP   [facility] Providers Cardiologist:  Virl Axe, MD Electrophysiologist:  Vickie Epley, MD     Evaluation Performed:  Follow-Up Visit  Chief Complaint:  OSA  History of Present Illness:    Nicholas [name] is a 76 y.o. male with a hx of anxiety, HTN, PE, heart block s/p PPM and PAF who was referred for sleep study due to afib.  He underwent home sleep study showing severe OSA with an AHI of 45.7/hr and nocturnal hypoxemia with O2 sats as low as 79%.  He underwent CPAP titration to 15cm H2O and is now here for followup.   He is doing well with his CPAP device and thinks that he has gotten used to it.  he tolerates the mask and feels the pressure is adequate.  Since going on CPAP he feels rested in the am and has no significant daytime sleepiness.  He denies any significant mouth or nasal dryness or nasal congestion.  He does not think that he snores.     The patient does not have symptoms concerning for COVID-19 infection (fever, chills, cough, or new shortness of breath).    Past Medical History:  Diagnosis Date   Abdominal  pain, chronic, right lower quadrant 12/13/2020   Anterior urethral stricture    Anxiety    Arthritis    a. knees, hips, hands;  b. 11/2013 s/p L TKA @ [facility].   Bile reflux gastritis    Bulging lumbar disc    BXO (balanitis xerotica obliterans)    Complete heart block ([place])    a. s/p MDT dual chamber (His bundle) pacemaker 01/2016 Dr Caryl Comes   Depression    DVT (deep venous thrombosis) ([place])    Erosive esophagitis    Gross hematuria    Hyperlipemia    Hypertension    borderline   Internal hemorrhoids    Phimosis    Pulmonary embolism ([place])    Past Surgical History:  Procedure Laterality Date   ATRIAL FIBRILLATION ABLATION N/A 05/17/2020   Procedure: ATRIAL FIBRILLATION ABLATION;  Surgeon: Vickie Epley, MD;  Location: [facility];  Service: Cardiovascular;  Laterality: N/A;   BUNIONECTOMY Bilateral 01/06/2015   Procedure: Lillard Anes;  Surgeon: [name], MD;  Location: [facility] ORS;  Service: Orthopedics;  Laterality: Bilateral;   CARDIAC CATHETERIZATION  ~ 2005   "once"   CATARACT EXTRACTION W/ INTRAOCULAR LENS  IMPLANT, BILATERAL Bilateral ~ 2010   COLONOSCOPY WITH PROPOFOL N/A 12/07/2015   Procedure: COLONOSCOPY WITH PROPOFOL;  Surgeon: Lollie Sails, MD;  Location: [facility] ENDOSCOPY;  Service: Endoscopy;  Laterality: N/A;   COLONOSCOPY WITH  PROPOFOL N/A 11/26/2020   Procedure: COLONOSCOPY WITH PROPOFOL;  Surgeon: Lin Landsman, MD;  Location: [facility] ENDOSCOPY;  Service: Gastroenterology;  Laterality: N/A;   EP IMPLANTABLE DEVICE N/A 02/16/2016   MDT dual chamber (His Bundle) pacemaker implanted by Dr Caryl Comes for intermittent complete heart block   ESOPHAGOGASTRODUODENOSCOPY (EGD) WITH PROPOFOL N/A 12/07/2015   Procedure: ESOPHAGOGASTRODUODENOSCOPY (EGD) WITH PROPOFOL;  Surgeon: Lollie Sails, MD;  Location: [facility] ENDOSCOPY;  Service: Endoscopy;  Laterality: N/A;   ESOPHAGOGASTRODUODENOSCOPY (EGD) WITH PROPOFOL N/A 05/17/2017   Procedure:  ESOPHAGOGASTRODUODENOSCOPY (EGD) WITH PROPOFOL;  Surgeon: Lollie Sails, MD;  Location: [facility] END[facility]COPY;  Service: Endoscopy;  Laterality: N/A;   HAMMER TOE SURGERY Bilateral 01/06/2015   Procedure: HAMMER TOE CORRECTION;  Surgeon: [name], MD;  Location: [facility] ORS;  Service: Orthopedics;  Laterality: Bilateral;   KNEE CARTILAGE SURGERY Left 1965   "football injury"   Left Total Knee Arthroplasty     a. 11/2013 [facility].   PACEMAKER IMPLANT  2017   PILONIDAL CYST EXCISION  1970's   TOTAL HIP ARTHROPLASTY Right 2004   TOTAL HIP ARTHROPLASTY Left 2006   UPPER GI ENDOSCOPY       Current Meds  Medication Sig   acetaminophen (TYLENOL) 650 MG CR tablet Take 650 mg by mouth every 8 (eight) hours as needed for pain.   apixaban (ELIQUIS) 5 MG TABS tablet Take 1 tablet (5 mg total) by mouth 2 (two) times daily.   Ascorbic Acid (VITAMIN C) 1000 MG tablet Take 1,000 mg by mouth daily.   Blood Glucose Monitoring Suppl (ONETOUCH VERIO) w/Device KIT Use to check blood sugar 3 times a day and document results, bring to appointments.  Goal is <130 fasting blood sugar and <180 two hours after meals.   Cranberry 500 MG CAPS Take 500 mg by mouth daily.   gabapentin (NEURONTIN) 600 MG tablet TAKE 1 TABLET BY MOUTH EVERYDAY AT BEDTIME   glucose blood test strip Use to check blood sugar 3 times a day and document results, bring to appointments.  Goal is <130 fasting blood sugar and <180 two hours after meals.   [START ON 04/22/2021] HYDROcodone-acetaminophen (NORCO/VICODIN) 5-325 MG tablet Take 1 tablet by mouth 2 (two) times daily.   Lancets (ONETOUCH ULTRASOFT) lancets Use to check blood sugar 3 times a day and document results, bring to appointments.  Goal is <130 fasting blood sugar and <180 two hours after meals.   LORazepam (ATIVAN) 1 MG tablet TAKE 1 TABLET BY MOUTH AT BEDTIME.   Magnesium 400 MG TABS Take 400 mg by mouth daily.    MELATONIN PO Take by mouth.   midodrine (PROAMATINE) 10 MG tablet  TAKE 1 TABLET BY MOUTH THREE TIMES A DAY   Multiple Vitamin (MULTIVITAMIN WITH MINERALS) TABS tablet Take 1 tablet by mouth daily. Centrum Silver   Omega-3 Fatty Acids (FISH OIL) 1200 MG CAPS Take 1,200 mg by mouth daily.   oxymetazoline (AFRIN) 0.05 % nasal spray Place 2 sprays into both nostrils at bedtime.   potassium chloride SA (KLOR-CON) 20 MEQ tablet Take 2 tablets (40 meq) x 1 dose as directed (Patient taking differently: Take 40 mEq by mouth daily as needed (with torsemide (fluid retention)).)   rOPINIRole (REQUIP) 0.25 MG tablet Take 0.25 mg by mouth 4 (four) times daily.   rosuvastatin (CRESTOR) 40 MG tablet TAKE 1 TABLET BY MOUTH EVERY DAY   Semaglutide,0.25 or 0.5MG/DOS, (OZEMPIC, 0.25 OR 0.5 MG/DOSE,) 2 MG/1.5ML SOPN Start with 0.25 MG into skin once a  week x 4 weeks, then increase to 0.5 MG skin weekly.   solifenacin (VESICARE) 10 MG tablet TAKE 1 TABLET BY MOUTH EVERY DAY   tiZANidine (ZANAFLEX) 4 MG tablet Take 1 tablet (4 mg total) by mouth every 6 (six) hours as needed for muscle spasms.   torsemide (DEMADEX) 20 MG tablet Take 40 mg by mouth daily as needed (fluid retention).   venlafaxine XR (EFFEXOR XR) 75 MG 24 hr capsule Take 3 capsules (225 mg total) by mouth daily.   [DISCONTINUED] HYDROcodone-acetaminophen (NORCO/VICODIN) 5-325 MG tablet Take 1 tablet by mouth 2 (two) times daily.   [DISCONTINUED] pantoprazole (PROTONIX) 20 MG tablet TAKE 2 TABLETS (40 MG TOTAL) BY MOUTH DAILY. (Patient taking differently: Take 20 mg by mouth daily.)   [DISCONTINUED] QUEtiapine Fumarate (SEROQUEL XR) 150 MG 24 hr tablet TAKE 1 TABLET (150 MG TOTAL) BY MOUTH AT BEDTIME.   [DISCONTINUED] tamsulosin (FLOMAX) 0.4 MG CAPS capsule TAKE 1 CAPSULE BY MOUTH EVERY DAY AFTER SUPPER     Allergies:   Patient has no known allergies.   Social History   Tobacco Use   Smoking status: Never   Smokeless tobacco: Never  Vaping Use   Vaping Use: Never used  Substance Use Topics   Alcohol use: Not  Currently   Drug use: No     Family Hx: The patient's family history includes Alcoholism in his mother; Asthma in his son; Breast cancer in his sister; Cancer in his mother; Diabetes in his daughter and father; Glaucoma in his sister; Hypertension in his father; Prostate cancer in his paternal grandfather; Stroke in his father and paternal grandmother.  ROS:   Please see the history of present illness.     All other systems reviewed and are negative.   Prior CV studies:   The following studies were reviewed today:  Home sleep study, PAP titration and PAP compliance download  Labs/Other Tests and Data Reviewed:    EKG:  No ECG reviewed.  Recent Labs: 11/16/2020: Magnesium 2.2 02/21/2021: TSH 1.420 03/23/2021: ALT 28; BUN 15; Creatinine, Ser 0.98; Hemoglobin 15.1; Platelets 172; Potassium 4.1; Sodium 143   Recent Lipid Panel Lab Results  Component Value Date/Time   CHOL 154 02/21/2021 11:08 AM   CHOL 198 01/03/2018 04:10 PM   TRIG 127 02/21/2021 11:08 AM   TRIG 264 (H) 01/03/2018 04:10 PM   HDL 65 02/21/2021 11:08 AM   CHOLHDL 3.6 07/16/2018 08:13 AM   LDLCALC 67 02/21/2021 11:08 AM    Wt Readings from Last 3 Encounters:  03/29/21 280 lb (127 kg)  03/23/21 284 lb 3.2 oz (128.9 kg)  02/28/21 282 lb (127.9 kg)     Risk Assessment/Calculations:    CHA2DS2-VASc Score = 4   This indicates a 4.8% annual risk of stroke. The patient's score is based upon: CHF History: 0 HTN History: 1 Diabetes History: 0 Stroke History: 0 Vascular Disease History: 1 Age Score: 2 Gender Score: 0        Objective:    Vital Signs:  BP 113/70    Pulse (!) 104    Ht 5' 10" (1.778 m)    Wt 280 lb (127 kg)    BMI 40.18 kg/m   Well nourished, well developed male in no acute distress. Well appearing, alert and conversant, regular work of breathing,  good skin color  Eyes- anicteric mouth- oral mucosa is pink  neuro- grossly intact skin- no apparent rash or lesions or cyanosis   ASSESSMENT & PLAN:  OSA - The patient is tolerating PAP therapy well without any problems. The PAP download performed by his DME was personally reviewed and interpreted by me today and showed an AHI of 1.4/hr on auto CPAP with 92% compliance in using more than 4 hours nightly.  The patient has been using and benefiting from PAP use and will continue to benefit from therapy.    PAF -He is maintaining normal sinus rhythm on exam and denies any palpitations  -He has not had any bleeding problems on DOAC -Continue prescription drug management apixaban 5 mg twice daily with as needed refills   Medication Adjustments/Labs and Tests Ordered: Current medicines are reviewed at length with the patient today.  Concerns regarding medicines are outlined above.   Tests Ordered: No orders of the defined types were placed in this encounter.    Medication Changes: No orders of the defined types were placed in this encounter.    Follow Up:  Virtual Visit  in 4 week(s)  Signed, Fransico Him, MD  03/29/2021 3:21 PM    [place]

## 2021-03-29 NOTE — Patient Instructions (Signed)

## 2021-03-29 NOTE — Telephone Encounter (Signed)
Requested medication (s) are due for refill today: Protonix - No   Seroquel  - Yes  Requested medication (s) are on the active medication list: Yes  Last refill:  Protonix 03/09/21 Seroquel 03/05/20  Future visit scheduled: No  Notes to clinic:  Seroquel has expired. Pharmacy has refills of Protonix.    Requested Prescriptions  Pending Prescriptions Disp Refills   QUEtiapine Fumarate (SEROQUEL XR) 150 MG 24 hr tablet [Pharmacy Med Name: QUETIAPINE ER 150 MG TABLET] 90 tablet 4    Sig: TAKE 1 TABLET BY MOUTH AT BEDTIME.     Not Delegated - Psychiatry:  Antipsychotics - Second Generation (Atypical) - quetiapine Failed - 03/28/2021 12:26 PM      Failed - This refill cannot be delegated      Failed - Lipid Panel in normal range within the last 12 months    Cholesterol, Total  Date Value Ref Range Status  02/21/2021 154 100 - 199 mg/dL Final   Cholesterol Piccolo, Waived  Date Value Ref Range Status  01/03/2018 198 <200 mg/dL Final    Comment:                            Desirable                <200                         Borderline High      200- 239                         High                     >239    LDL Chol Calc (NIH)  Date Value Ref Range Status  02/21/2021 67 0 - 99 mg/dL Final   HDL  Date Value Ref Range Status  02/21/2021 65 >39 mg/dL Final   Triglycerides  Date Value Ref Range Status  02/21/2021 127 0 - 149 mg/dL Final   Triglycerides Piccolo,Waived  Date Value Ref Range Status  01/03/2018 264 (H) <150 mg/dL Final    Comment:                            Normal                   <150                         Borderline High     150 - 199                         High                200 - 499                         Very High                >499          Failed - CMP within normal limits and completed in the last 12 months    Albumin  Date Value Ref Range Status  03/23/2021 4.5 3.7 - 4.7 g/dL Final  04/07/2013 4.3 3.4 - 5.0 g/dL Final   Alkaline  Phosphatase  Date Value Ref Range Status  03/23/2021 56 44 - 121 IU/L Final  04/07/2013 45 Unit/L Final    Comment:    45-117 NOTE: New Reference Range 12/13/12    ALT  Date Value Ref Range Status  03/23/2021 28 0 - 44 IU/L Final   SGPT (ALT)  Date Value Ref Range Status  04/07/2013 22 12 - 78 U/L Final   ALT (SGPT) Piccolo, Waived  Date Value Ref Range Status  01/03/2018 24 10 - 47 U/L Final   AST  Date Value Ref Range Status  03/23/2021 27 0 - 40 IU/L Final   SGOT(AST)  Date Value Ref Range Status  04/07/2013 28 15 - 37 Unit/L Final   AST (SGOT) Piccolo, Waived  Date Value Ref Range Status  01/03/2018 34 11 - 38 U/L Final   BUN  Date Value Ref Range Status  03/23/2021 15 8 - 27 mg/dL Final  02/26/2014 20 (H) 7 - 18 mg/dL Final   Calcium  Date Value Ref Range Status  03/23/2021 9.5 8.6 - 10.2 mg/dL Final   Calcium, Total  Date Value Ref Range Status  02/26/2014 8.4 (L) 8.5 - 10.1 mg/dL Final   Calcium, Ion  Date Value Ref Range Status  04/17/2013 1.20 1.13 - 1.30 mmol/L Final   CO2  Date Value Ref Range Status  03/23/2021 24 20 - 29 mmol/L Final   Co2  Date Value Ref Range Status  02/26/2014 25 21 - 32 mmol/L Final   TCO2  Date Value Ref Range Status  04/17/2013 21 0 - 100 mmol/L Final   Creatinine  Date Value Ref Range Status  02/26/2014 0.94 0.60 - 1.30 mg/dL Final   Creatinine, Ser  Date Value Ref Range Status  03/23/2021 0.98 0.76 - 1.27 mg/dL Final   Glucose  Date Value Ref Range Status  03/23/2021 103 (H) 70 - 99 mg/dL Final  02/26/2014 137 (H) 65 - 99 mg/dL Final   Glucose, Bld  Date Value Ref Range Status  04/08/2020 189 (H) 70 - 99 mg/dL Final    Comment:    Glucose reference range applies only to samples taken after fasting for at least 8 hours.   Glucose-Capillary  Date Value Ref Range Status  04/17/2013 102 (H) 70 - 99 mg/dL Final   Potassium  Date Value Ref Range Status  03/23/2021 4.1 3.5 - 5.2 mmol/L Final   02/26/2014 3.8 3.5 - 5.1 mmol/L Final   Sodium  Date Value Ref Range Status  03/23/2021 143 134 - 144 mmol/L Final  02/26/2014 137 136 - 145 mmol/L Final   Bilirubin,Total  Date Value Ref Range Status  04/07/2013 0.5 0.2 - 1.0 mg/dL Final   Bilirubin Total  Date Value Ref Range Status  03/23/2021 0.6 0.0 - 1.2 mg/dL Final   Bilirubin, Direct  Date Value Ref Range Status  04/07/2013 <0.2 0.0 - 0.3 mg/dL Final   Indirect Bilirubin  Date Value Ref Range Status  04/07/2013 NOT CALCULATED 0.3 - 0.9 mg/dL Final   Protein, ur  Date Value Ref Range Status  01/05/2018 100 (A) NEGATIVE mg/dL Final   Protein,UA  Date Value Ref Range Status  03/23/2021 Negative Negative/Trace Final   Total Protein  Date Value Ref Range Status  03/23/2021 7.0 6.0 - 8.5 g/dL Final  04/07/2013 7.6 6.4 - 8.2 g/dL Final   EGFR (African American)  Date Value Ref Range Status  02/26/2014 >60 >70m/min Final  04/07/2013 >60  Final   GFR calc  Af Amer  Date Value Ref Range Status  02/11/2020 60 >59 mL/min/1.73 Final    Comment:    **In accordance with recommendations from the NKF-ASN Task force,**   [facility] is in the process of updating its eGFR calculation to the   2021 CKD-EPI creatinine equation that estimates kidney function   without a race variable.    eGFR  Date Value Ref Range Status  03/23/2021 80 >59 mL/min/1.73 Final   EGFR (Non-African Amer.)  Date Value Ref Range Status  02/26/2014 >60 >56m/min Final    Comment:    eGFR values <633mmin/1.73 m2 may be an indication of chronic kidney disease (CKD). Calculated eGFR, using the MRDR Study equation, is useful in  patients with stable renal function. The eGFR calculation will not be reliable in acutely ill patients when serum creatinine is changing rapidly. It is not useful in patients on dialysis. The eGFR calculation may not be applicable to patients at the low and high extremes of body sizes, pregnant women, and  vegetarians.   04/07/2013 59 (L)  Final    Comment:    eGFR values <6032min/1.73 m2 may be an indication of chronic kidney disease (CKD). Calculated eGFR is useful in patients with stable renal function. The eGFR calculation will not be reliable in acutely ill patients when serum creatinine is changing rapidly. It is not useful in  patients on dialysis. The eGFR calculation may not be applicable to patients at the low and high extremes of body sizes, pregnant women, and vegetarians.    GFR, Estimated  Date Value Ref Range Status  04/08/2020 >60 >60 mL/min Final    Comment:    (NOTE) Calculated using the CKD-EPI Creatinine Equation (2021)          Passed - TSH in normal range and within 360 days    TSH  Date Value Ref Range Status  02/21/2021 1.420 0.450 - 4.500 uIU/mL Final          Passed - Completed PHQ-2 or PHQ-9 in the last 360 days      Passed - Last BP in normal range    BP Readings from Last 1 Encounters:  03/23/21 118/82          Passed - Last Heart Rate in normal range    Pulse Readings from Last 1 Encounters:  03/23/21 83          Passed - Valid encounter within last 6 months    Recent Outpatient Visits           6 days ago Abdominal pain, chronic, right lower quadrant   [facility]   1 month ago Type 2 diabetes mellitus with obesity ([place]CC[place] [facility]   3 months ago Abdominal pain, chronic, right lower quadrant   [facility] [place] NP   4 months ago Morbid obesity (HCCShoreham CriPocatelloolene T, NP   5 months ago Need for influenza vaccination   [place] DarterP       Future Appointments             Today TurSueanne MargaritaD [facility]   In 1 m51 monthriFair LawnECRivertonIn 1 month CanJamestownolLinn NP CriM[facility] MIRAGEEC             Passed - CBC within  normal limits and completed in the last 12 months    WBC  Date Value Ref Range Status  03/23/2021 6.3 3.4 - 10.8 x10E3/uL Final  04/08/2020 6.0 4.0 - 10.5 K/uL Final   RBC  Date Value Ref Range Status  03/23/2021 4.90 4.14 - 5.80 x10E6/uL Final  04/08/2020 5.24 4.22 - 5.81 MIL/uL Final   Hemoglobin  Date Value Ref Range Status  03/23/2021 15.1 13.0 - 17.7 g/dL Final   Hematocrit  Date Value Ref Range Status  03/23/2021 46.0 37.5 - 51.0 % Final   MCHC  Date Value Ref Range Status  03/23/2021 32.8 31.5 - 35.7 g/dL Final  04/08/2020 33.1 30.0 - 36.0 g/dL Final   [facility]  Date Value Ref Range Status  03/23/2021 30.8 26.6 - 33.0 pg Final  04/08/2020 30.3 26.0 - 34.0 pg Final   MCV  Date Value Ref Range Status  03/23/2021 94 79 - 97 fL Final  02/26/2014 88 80 - 100 fL Final   No results found for: PLTCOUNTKUC, LABPLAT, POCPLA RDW  Date Value Ref Range Status  03/23/2021 13.3 11.6 - 15.4 % Final  02/26/2014 14.0 11.5 - 14.5 % Final          pantoprazole (PROTONIX) 20 MG tablet [Pharmacy Med Name: PANTOPRAZOLE SOD DR 20 MG TAB] 90 tablet 3    Sig: TAKE 1 TABLET BY MOUTH EVERY DAY     Gastroenterology: Proton Pump Inhibitors Passed - 03/28/2021 12:26 PM      Passed - Valid encounter within last 12 months    Recent Outpatient Visits           6 days ago Abdominal pain, chronic, right lower quadrant   [facility], Jolene T, NP   1 month ago Type 2 diabetes mellitus with obesity ([place])   [place], Jolene T, NP   3 months ago Abdominal pain, chronic, right lower quadrant   [place], Lucan T, NP   4 months ago Morbid obesity ([place])   [place], Jolene T, NP   5 months ago Need for influenza vaccination   [facility] [place], Scheryl Darter, NP       Future Appointments             Today Sueanne Margarita, MD [place]    In 1 month  [place], [place]   In 1 month [place], Barbaraann Faster, NP [facility], [facility]

## 2021-04-05 ENCOUNTER — Other Ambulatory Visit: Payer: Self-pay

## 2021-04-05 ENCOUNTER — Ambulatory Visit (INDEPENDENT_AMBULATORY_CARE_PROVIDER_SITE_OTHER): Payer: Medicare Other | Admitting: Internal Medicine

## 2021-04-05 ENCOUNTER — Encounter: Payer: Self-pay | Admitting: Internal Medicine

## 2021-04-05 VITALS — BP 120/80 | HR 95 | Ht 70.0 in | Wt 279.0 lb

## 2021-04-05 DIAGNOSIS — I4819 Other persistent atrial fibrillation: Secondary | ICD-10-CM

## 2021-04-05 DIAGNOSIS — I5032 Chronic diastolic (congestive) heart failure: Secondary | ICD-10-CM | POA: Diagnosis not present

## 2021-04-05 DIAGNOSIS — Z950 Presence of cardiac pacemaker: Secondary | ICD-10-CM | POA: Diagnosis not present

## 2021-04-05 DIAGNOSIS — I442 Atrioventricular block, complete: Secondary | ICD-10-CM | POA: Diagnosis not present

## 2021-04-05 NOTE — Patient Instructions (Signed)
Medication Instructions:  - Your physician recommends that you continue on your current medications as directed. Please refer to the Current Medication list given to you today.  *If you need a refill on your cardiac medications before your next appointment, please call your pharmacy*   Lab Work: - none ordered  If you have labs (blood work) drawn today and your tests are completely normal, you will receive your results only by: MyChart Message (if you have MyChart) OR A paper copy in the mail If you have any lab test that is abnormal or we need to change your treatment, we will call you to review the results.   Testing/Procedures: - none ordered   Follow-Up: At [facility], you and your health needs are our priority.  As part of our continuing mission to provide you with exceptional heart care, we have created designated Provider Care Teams.  These Care Teams include your primary Cardiologist (physician) and Advanced Practice Providers (APPs -  Physician Assistants and Nurse Practitioners) who all work together to provide you with the care you need, when you need it.  We recommend signing up for the patient portal called "MyChart".  Sign up information is provided on this After Visit Summary.  MyChart is used to connect with patients for Virtual Visits (Telemedicine).  Patients are able to view lab/test results, encounter notes, upcoming appointments, etc.  Non-urgent messages can be sent to your provider as well.   To learn more about what you can do with MyChart, go to [email or web address].    Your next appointment:   1 year(s)  The format for your next appointment:   In Person  Provider:   Steven Klein, MD   Other Instructions N/a  

## 2021-04-05 NOTE — Progress Notes (Signed)
? ? ? ? ?Patient Care Team: ?Venita Lick, NP as PCP - General (Nurse Practitioner) ?Vickie Epley, MD as PCP - Electrophysiology (Cardiology) ?Deboraha Sprang, MD as PCP - Cardiology (Cardiology) ?[name], MD (Specialist) ?Hollice Espy, MD as Consulting Physician (Urology) ?Tanda Rockers, MD as Consulting Physician (Pulmonary Disease) ?[name], MD as Consulting Physician (Anesthesiology) ?Deboraha Sprang, MD as Consulting Physician (Cardiology) ?Greg Cutter, LCSW as Education officer, museum (Licensed Holiday representative) ?Vladimir Faster, [place] (Inactive) (Pharmacist) ? ? ?HPI ? ?Nicholas [name] is a 76 y.o. male ?Seen in follow-up for His bundle pacemaker implanted 1/18 for symptomatic bradycardia  and profound first degree AV block with intermittent third and second-degree heart block; also atrial fibrillation, previously on flecainide but discontinued because of coronary disease diagnosed by CTA 10/21.>> propafenone with subsequent increase in burden ?Saw CL and underwent RFCA-PVI 4/22 ? ?Very little afib since and no longer on propafenone.  Anticoagulated with apixaban without bleeding  ? ?The patient denies chest pain, nocturnal dyspnea, orthopnea or peripheral edema.  There have been no palpitations, lightheadedness or syncope.  Complains of shortness of breath which is stable and fatigue which is somewhat better since these working with this CPAP..  ?  ? ?Golf --back to playing ?  ?  ? ?DATE TEST EF   ?10/15 echo     50 %   Mild RV dysfunction   ?11/17 echo    60 %    Nl RV function   ?11/17 Myoview   [facility])   ?10/21 Cardiac-CTA  LADd- FFR 0.75; o/w >90  ?10/21 Echo 60-65%   ?10/21 CTA-PE    ? ? ?hxstory of pulmonary embolism with repeat venous Dopplers 1/16 demonstrated small clots Managed with chronic apixaban ?  ?  ? ?  ?Date Cr K LDL Hgb  ?6/19 1.08   14.6  ?6/20 1.13   14.7  ?6/21 1.17<<1.46   15.6  ?1/22 1.34 4.2 85 13.6 (10/21)  ?8/22 0.9 4.6 65 15.5  ?3./23 0.98 4.1 67 15.1   ?  ? ?Thromboembolic risk factors ( age -38 , HTN-1) for a CHADSVASc Score of 3 ? ?  ?  ?Past Medical History:  ?Diagnosis Date  ? Abdominal pain, chronic, right lower quadrant 12/13/2020  ? Anterior urethral stricture   ? Anxiety   ? Arthritis   ? a. knees, hips, hands;  b. 11/2013 s/p L TKA @ [place].  ? Bile reflux gastritis   ? Bulging lumbar disc   ? BXO (balanitis xerotica obliterans)   ? Complete heart block ([place])   ? a. s/p MDT dual chamber (His bundle) pacemaker 01/2016 Dr Caryl Comes  ? Depression   ? DVT (deep venous thrombosis) ([place])   ? Erosive esophagitis   ? Gross hematuria   ? Hyperlipemia   ? Hypertension   ? borderline  ? Internal hemorrhoids   ? Phimosis   ? Pulmonary embolism ([place])   ? ? ?Past Surgical History:  ?Procedure Laterality Date  ? ATRIAL FIBRILLATION ABLATION N/A 05/17/2020  ? Procedure: ATRIAL FIBRILLATION ABLATION;  Surgeon: Vickie Epley, MD;  Location: [place] [facility] LAB;  Service: Cardiovascular;  Laterality: N/A;  ? BUNIONECTOMY Bilateral 01/06/2015  ? Procedure: BUNIONECTOMY;  Surgeon: [name], MD;  Location: [facility] ORS;  Service: Orthopedics;  Laterality: Bilateral;  ? CARDIAC CATHETERIZATION  ~ 2005  ? "once"  ? CATARACT EXTRACTION W/ INTRAOCULAR LENS  IMPLANT, BILATERAL Bilateral ~ 2010  ? COLONOSCOPY  WITH PROPOFOL N/A 12/07/2015  ? Procedure: COLONOSCOPY WITH PROPOFOL;  Surgeon: Lollie Sails, MD;  Location[facility] ENDOSCOPY;  Service: Endoscopy;  Laterality: N/A;  ? COLONOSCOPY WITH PROPOFOL N/A 11/26/2020  ? Procedure: COLONOSCOPY WITH PROPOFOL;  Surgeon: Lin Landsman, MD;  Location: [facility] ENDOSCOPY;  Service: Gastroenterology;  Laterality: N/A;  ? EP IMPLANTABLE DEVICE N/A 02/16/2016  ? MDT dual chamber (His Bundle) pacemaker implanted by Dr Caryl Comes for intermittent complete heart block  ? ESOPHAGOGASTRODUODENOSCOPY (EGD) WITH PROPOFOL N/A 12/07/2015  ? Procedure: ESOPHAGOGASTRODUODENOSCOPY (EGD) WITH PROPOFOL;  Surgeon: Lollie Sails, MD;  Location: [facility]  ENDOSCOPY;  Service: Endoscopy;  Laterality: N/A;  ? ESOPHAGOGASTRODUODENOSCOPY (EGD) WITH PROPOFOL N/A 05/17/2017  ? Procedure: ESOPHAGOGASTRODUODENOSCOPY (EGD) WITH PROPOFOL;  Surgeon: Lollie Sails, MD;  Location[facility] ENDOSCOPY;  Service: Endoscopy;  Laterality: N/A;  ? HAMMER TOE SURGERY Bilateral 01/06/2015  ? Procedure: HAMMER TOE CORRECTION;  Surgeon: [name], MD;  Location: [facility] ORS;  Service: Orthopedics;  Laterality: Bilateral;  ? [place]  ? "football injury"  ? Left Total Knee Arthroplasty    ? a. 11/2013 [facility].  ? PACEMAKER IMPLANT  2017  ? PILONIDAL CYST EXCISION  1970's  ? TOTAL HIP ARTHROPLASTY Right 2004  ? TOTAL HIP ARTHROPLASTY Left 2006  ? UPPER GI ENDOSCOPY    ? ? ?Current Outpatient Medications  ?Medication Sig Dispense Refill  ? acetaminophen (TYLENOL) 650 MG CR tablet Take 650 mg by mouth every 8 (eight) hours as needed for pain.    ? apixaban (ELIQUIS) 5 MG TABS tablet Take 1 tablet (5 mg total) by mouth 2 (two) times daily. 180 tablet 1  ? Ascorbic Acid (VITAMIN C) 1000 MG tablet Take 1,000 mg by mouth daily.    ? Blood Glucose Monitoring Suppl (ONETOUCH VERIO) w/Device KIT Use to check blood sugar 3 times a day and document results, bring to appointments.  Goal is <130 fasting blood sugar and <180 two hours after meals. 1 kit 0  ? Cranberry 500 MG CAPS Take 500 mg by mouth daily.    ? gabapentin (NEURONTIN) 600 MG tablet TAKE 1 TABLET BY MOUTH EVERYDAY AT BEDTIME 90 tablet 1  ? glucose blood test strip Use to check blood sugar 3 times a day and document results, bring to appointments.  Goal is <130 fasting blood sugar and <180 two hours after meals. 100 each 12  ? [START ON 04/22/2021] HYDROcodone-acetaminophen (NORCO/VICODIN) 5-325 MG tablet Take 1 tablet by mouth 2 (two) times daily. 60 tablet 0  ? Lancets (ONETOUCH ULTRASOFT) lancets Use to check blood sugar 3 times a day and document results, bring to appointments.  Goal is <130 fasting blood sugar and  <180 two hours after meals. 100 each 12  ? LORazepam (ATIVAN) 1 MG tablet TAKE 1 TABLET BY MOUTH AT BEDTIME. 90 tablet 0  ? Magnesium 400 MG TABS Take 400 mg by mouth daily.     ? MELATONIN PO Take by mouth.    ? midodrine (PROAMATINE) 10 MG tablet TAKE 1 TABLET BY MOUTH THREE TIMES A DAY 270 tablet 3  ? Multiple Vitamin (MULTIVITAMIN WITH MINERALS) TABS tablet Take 1 tablet by mouth daily. Centrum Silver    ? Omega-3 Fatty Acids (FISH OIL) 1200 MG CAPS Take 1,200 mg by mouth daily.    ? oxymetazoline (AFRIN) 0.05 % nasal spray Place 2 sprays into both nostrils at bedtime.    ? pantoprazole (PROTONIX) 20 MG tablet TAKE 1 TABLET BY MOUTH EVERY  DAY 90 tablet 4  ? potassium chloride SA (KLOR-CON) 20 MEQ tablet Take 2 tablets (40 meq) x 1 dose as directed (Patient taking differently: Take 40 mEq by mouth daily as needed (with torsemide (fluid retention)).) 10 tablet 0  ? QUEtiapine Fumarate (SEROQUEL XR) 150 MG 24 hr tablet TAKE 1 TABLET BY MOUTH AT BEDTIME. 90 tablet 4  ? rOPINIRole (REQUIP) 0.25 MG tablet Take 0.25 mg by mouth 4 (four) times daily.    ? rosuvastatin (CRESTOR) 40 MG tablet TAKE 1 TABLET BY MOUTH EVERY DAY 90 tablet 1  ? Semaglutide,0.25 or 0.5MG/DOS, (OZEMPIC, 0.25 OR 0.5 MG/DOSE,) 2 MG/1.5ML SOPN Start with 0.25 MG into skin once a week x 4 weeks, then increase to 0.5 MG skin weekly. 1.5 mL 12  ? solifenacin (VESICARE) 10 MG tablet TAKE 1 TABLET BY MOUTH EVERY DAY 90 tablet 1  ? tiZANidine (ZANAFLEX) 4 MG tablet Take 1 tablet (4 mg total) by mouth every 6 (six) hours as needed for muscle spasms. 90 tablet 4  ? torsemide (DEMADEX) 20 MG tablet Take 40 mg by mouth daily as needed (fluid retention).    ? venlafaxine XR (EFFEXOR XR) 75 MG 24 hr capsule Take 3 capsules (225 mg total) by mouth daily. 270 capsule 4  ? ?No current facility-administered medications for this visit.  ? ? ?No Known Allergies ? ? ? ?Review of Systems negative except from HPI and PMH ? ?Physical Exam ? ?BP 120/80 (BP Location: Left  Arm, Patient Position: Sitting, Cuff Size: Normal)   Pulse 95   Ht '5\' 10"'  (1.778 m)   Wt 279 lb (126.6 kg)   SpO2 95%   BMI 40.03 kg/m?  ?Well developed and Morbidly obese  in no acute distress ?HENT normal

## 2021-04-07 LAB — CUP PACEART INCLINIC DEVICE CHECK
Battery Remaining Longevity: 30 mo
Battery Voltage: 2.96 V
Brady Statistic AP VP Percent: 37.89 %
Brady Statistic AP VS Percent: 0.01 %
Brady Statistic AS VP Percent: 62.04 %
Brady Statistic AS VS Percent: 0.06 %
Brady Statistic RA Percent Paced: 37.79 %
Brady Statistic RV Percent Paced: 99.92 %
Date Time Interrogation Session: 20230314094634
Implantable Lead Implant Date: 20180124
Implantable Lead Implant Date: 20180124
Implantable Lead Location: 753859
Implantable Lead Location: 753860
Implantable Lead Model: 3830
Implantable Lead Model: 5076
Implantable Pulse Generator Implant Date: 20180124
Lead Channel Impedance Value: 342 Ohm
Lead Channel Impedance Value: 361 Ohm
Lead Channel Impedance Value: 399 Ohm
Lead Channel Impedance Value: 513 Ohm
Lead Channel Pacing Threshold Amplitude: 0.5 V
Lead Channel Pacing Threshold Amplitude: 1 V
Lead Channel Pacing Threshold Pulse Width: 0.4 ms
Lead Channel Pacing Threshold Pulse Width: 1 ms
Lead Channel Sensing Intrinsic Amplitude: 0.75 mV
Lead Channel Sensing Intrinsic Amplitude: 1.5 mV
Lead Channel Sensing Intrinsic Amplitude: 3.75 mV
Lead Channel Sensing Intrinsic Amplitude: 3.875 mV
Lead Channel Setting Pacing Amplitude: 2 V
Lead Channel Setting Pacing Amplitude: 2.25 V
Lead Channel Setting Pacing Pulse Width: 1 ms
Lead Channel Setting Sensing Sensitivity: 0.6 mV

## 2021-04-08 ENCOUNTER — Ambulatory Visit
Admission: RE | Admit: 2021-04-08 | Discharge: 2021-04-08 | Disposition: A | Discharge status: Home / Self Care | Payer: Medicare Other | Source: Ambulatory Visit | Attending: Nurse Practitioner | Admitting: Nurse Practitioner

## 2021-04-08 ENCOUNTER — Other Ambulatory Visit: Payer: Self-pay

## 2021-04-08 DIAGNOSIS — R1031 Right lower quadrant pain: Secondary | ICD-10-CM | POA: Insufficient documentation

## 2021-04-11 ENCOUNTER — Ambulatory Visit: Payer: Medicare Other | Admitting: Anesthesiology

## 2021-04-13 ENCOUNTER — Ambulatory Visit: Payer: Medicare Other | Admitting: Anesthesiology

## 2021-04-13 NOTE — Progress Notes (Signed)
Contacted via [place] ? ? ?Good afternoon Nicholas [name], your imaging has returned and overall is reassuring.   ?- You have a small amount of fluid around heart, but this should not cause any abdominal pain as you are describing and is very small. ?- No kidney stones noted and appendix is normal.  Overall no causes noted for your pain.  How are you feeling?  Are you getting more injections for the pain?  Any questions? ?Keep being amazing!!  Thank you for allowing me to participate in your care.  I appreciate you. ?Kindest regards, ?Gabbriella Presswood ?

## 2021-04-14 ENCOUNTER — Other Ambulatory Visit: Payer: Self-pay | Admitting: Nurse Practitioner

## 2021-04-15 NOTE — Telephone Encounter (Signed)
Requested medication (s) are due for refill today: Yes ? ?Requested medication (s) are on the active medication list: Yes ? ? ?Future visit scheduled: Yes ? ?Notes to clinic:  Alternative requested by pharmacy, see Rx request ? ? ? ? ?Requested Prescriptions  ?Pending Prescriptions Disp Refills  ? Semaglutide,0.25 or 0.'5MG'$ /DOS, (OZEMPIC, 0.25 OR 0.5 MG/DOSE,) 2 MG/3ML SOPN [Pharmacy Med Name: OZEMPIC 0.25-0.5 MG/DOSE PEN]  0  ?  ? There is no refill protocol information for this order  ?  ? ? ? ? ?

## 2021-04-20 ENCOUNTER — Other Ambulatory Visit: Payer: Self-pay | Admitting: Nurse Practitioner

## 2021-04-20 DIAGNOSIS — F419 Anxiety disorder, unspecified: Secondary | ICD-10-CM

## 2021-04-21 ENCOUNTER — Other Ambulatory Visit: Payer: Self-pay | Admitting: Nurse Practitioner

## 2021-04-21 ENCOUNTER — Telehealth: Payer: Self-pay | Admitting: Nurse Practitioner

## 2021-04-21 DIAGNOSIS — F419 Anxiety disorder, unspecified: Secondary | ICD-10-CM

## 2021-04-21 MED ORDER — TRULICITY 0.75 MG/0.5ML ~~LOC~~ SOAJ: 0.7500 mg | SUBCUTANEOUS | 4 refills | Status: DC

## 2021-04-21 NOTE — Telephone Encounter (Signed)
Copied from [place] [id number]. Topic: General - Other ?>> Apr 21, 2021  2:37 PM Valere Dross wrote: ?Reason for CRM: Pt called in about medication Ozempic, wanting to speak with PCP, please advise. ?

## 2021-04-21 NOTE — Telephone Encounter (Signed)
Requested medication (s) are due for refill today:   Prescribed on 04/18/2021 by Jolene ? ?Requested medication (s) are on the active medication list:   Yes ? ?Future visit scheduled:   Yes ? ? ?Last ordered: 04/18/2021 3 ml, 12 refills. ? ?Send pharmacy note for reason returned.   Alternative requested.  ? ?Requested Prescriptions  ?Pending Prescriptions Disp Refills  ? OZEMPIC, 0.25 OR 0.5 MG/DOSE, 2 MG/3ML SOPN [Pharmacy Med Name: OZEMPIC 0.25-0.5 MG/DOSE PEN]  12  ?  Sig: Inject 0.5 mg into the skin once a week.  ?  ? There is no refill protocol information for this order  ?  ? ?

## 2021-04-21 NOTE — Telephone Encounter (Signed)
Medication Refill - Medication:  ?LORazepam (ATIVAN) 1 MG tablet  ? ?Has the patient contacted their pharmacy? Yes.   ?Contact PCP ? ?Preferred Pharmacy (with phone number or street name):  ?[id number] [place] - [address]  [id number] ?Phone:  [phone] Fax:  [phone] ? ?Has the patient been seen for an appointment in the last year OR does the patient have an upcoming appointment? Yes.   ? ?Agent: Please be advised that RX refills may take up to 3 business days. We ask that you follow-up with your pharmacy. ?

## 2021-04-21 NOTE — Telephone Encounter (Signed)
Requested medication (s) are due for refill today: yes ? ?Requested medication (s) are on the active medication list: yes ? ?Last refill:  01/20/21 #90 0 refills ? ?Future visit scheduled: yes in 1 month ? ?Notes to clinic:  not delegated per protocol ? ? ?  ?Requested Prescriptions  ?Pending Prescriptions Disp Refills  ? LORazepam (ATIVAN) 1 MG tablet [Pharmacy Med Name: LORAZEPAM 1 MG TABLET] 90 tablet 0  ?  Sig: TAKE 1 TABLET BY MOUTH EVERYDAY AT BEDTIME  ?  ? Not Delegated - Psychiatry: Anxiolytics/Hypnotics 2 Failed - 04/20/2021  3:23 PM  ?  ?  Failed - This refill cannot be delegated  ?  ?  Failed - Urine Drug Screen completed in last 360 days  ?  ?  Passed - Patient is not pregnant  ?  ?  Passed - Valid encounter within last 6 months  ?  Recent Outpatient Visits   ? ?      ? 4 weeks ago Abdominal pain, chronic, right lower quadrant  ? [place], [place] T, NP  ? 1 month ago Type 2 diabetes mellitus with obesity ([place])  ? [place], [place] T, NP  ? 4 months ago Abdominal pain, chronic, right lower quadrant  ? [place], [place] T, NP  ? 5 months ago Morbid obesity ([place])  ? [place], [place] T, NP  ? 6 months ago Need for influenza vaccination  ? [facility], Lauren A, NP  ? ?  ?  ?Future Appointments   ? ?        ? In 1 week  [facility]  ? In 1 month Cannady, Barbaraann Faster, NP [facility], [facility]  ? ?  ? ?  ?  ?  ? ?

## 2021-04-22 ENCOUNTER — Telehealth: Payer: Self-pay

## 2021-04-22 ENCOUNTER — Telehealth: Payer: Self-pay | Admitting: Internal Medicine

## 2021-04-22 DIAGNOSIS — E1169 Type 2 diabetes mellitus with other specified complication: Secondary | ICD-10-CM

## 2021-04-22 DIAGNOSIS — I480 Paroxysmal atrial fibrillation: Secondary | ICD-10-CM

## 2021-04-22 MED ORDER — MIDODRINE HCL 10 MG PO TABS: 10.0000 mg | ORAL_TABLET | Freq: Three times a day (TID) | ORAL | 0 refills | Status: DC

## 2021-04-22 NOTE — Telephone Encounter (Signed)
Called patient no answer LVM to call back. °

## 2021-04-22 NOTE — Telephone Encounter (Signed)
? ?*  STAT* If patient is at the pharmacy, call can be transferred to refill team. ? ? ?1. Which medications need to be refilled? (please list name of each medication and dose if known)  ? midodrine (PROAMATINE) 10 MG tablet  ? ? ?2. Which pharmacy/location (including street and city if local pharmacy) is medication to be sent to? [place],  ?[address], [place], [place] [address], Phone: [phone] ? ?3. Do they need a 30 day or 90 day supply? 90 days ?

## 2021-04-22 NOTE — Telephone Encounter (Deleted)
Please review and call patient to advise. ? ?Copied from [place] [id number]. Topic: Quick Communication - Rx Refill/Question ?>> Apr 22, 2021 12:49 PM Pawlus, Brayton Layman A wrote: ?Pt called in regarding Dulaglutide (TRULICITY) 1.30 QM/5.7QI SOPN, pt stated he can not afford this Rx and it seems as though the prices have significantly increased of his medications, pt wanted to know if there was something else that could be sent in that he would be able to afford, please advise. ?

## 2021-04-22 NOTE — Telephone Encounter (Signed)
Copied from [place] [id number]. Topic: Quick Communication - Rx Refill/Question ?>> Apr 22, 2021 12:49 PM Pawlus, Brayton Layman A wrote: ?Pt called in regarding Dulaglutide (TRULICITY) 1.64 HD/3.9NS SOPN, pt stated he can not afford this Rx and it seems as though the prices have significantly increased of his medications, pt wanted to know if there was something else that could be sent in that he would be able to afford, please advise. ?

## 2021-04-22 NOTE — Telephone Encounter (Signed)
Requested medications are due for refill today.  no ? ?Requested medications are on the active medications list.  yes ? ?Last refill. 04/21/2021 #30 0 refills ? ?Future visit scheduled.   yes ? ?Notes to clinic.  Medication refill not delegated - medication refilled 04/21/2021 ? ? ? ?Requested Prescriptions  ?Pending Prescriptions Disp Refills  ? LORazepam (ATIVAN) 1 MG tablet 90 tablet 0  ?  Sig: Take 1 tablet (1 mg total) by mouth at bedtime.  ?  ? Not Delegated - Psychiatry: Anxiolytics/Hypnotics 2 Failed - 04/21/2021  3:02 PM  ?  ?  Failed - This refill cannot be delegated  ?  ?  Failed - Urine Drug Screen completed in last 360 days  ?  ?  Passed - Patient is not pregnant  ?  ?  Passed - Valid encounter within last 6 months  ?  Recent Outpatient Visits   ? ?      ? 1 month ago Abdominal pain, chronic, right lower quadrant  ? [place], [place] T, NP  ? 2 months ago Type 2 diabetes mellitus with obesity ([place])  ? [place], [place] T, NP  ? 4 months ago Abdominal pain, chronic, right lower quadrant  ? [place], [place] T, NP  ? 5 months ago Morbid obesity ([place])  ? [place], [place] T, NP  ? 6 months ago Need for influenza vaccination  ? [facility], Lauren A, NP  ? ?  ?  ?Future Appointments   ? ?        ? In 1 week  [facility], [facility]  ? In 1 month Cannady, Barbaraann Faster, NP [facility], [facility]  ? ?  ? ?  ?  ?  ?  ?

## 2021-04-22 NOTE — Telephone Encounter (Signed)
Please review and contact patient to advise. ?

## 2021-04-22 NOTE — Telephone Encounter (Signed)
Patient notified and verbalized understanding and has no further questions at this time.  ?

## 2021-05-02 ENCOUNTER — Ambulatory Visit (INDEPENDENT_AMBULATORY_CARE_PROVIDER_SITE_OTHER): Payer: Medicare Other | Admitting: *Deleted

## 2021-05-02 DIAGNOSIS — Z0000 Encounter for general adult medical examination without abnormal findings: Secondary | ICD-10-CM | POA: Diagnosis not present

## 2021-05-02 NOTE — Progress Notes (Signed)
? ?Subjective:  ? Nicholas [name] is a 76 y.o. male who presents for Medicare Annual/Subsequent preventive examination. ? ?I connected with  Nicholas [name] on 05/02/21 by a telephone enabled telemedicine application and verified that I am speaking with the correct person using two identifiers. ?  ?I discussed the limitations of evaluation and management by telemedicine. The patient expressed understanding and agreed to proceed. ? ?Patient location: home ? ?Provider location: Tele-Health not in office ? ? ? ?Review of Systems    ? ?Cardiac Risk Factors include: advanced age (>48mn, >>59women);diabetes mellitus;male gender;obesity (BMI >30kg/m2);sedentary lifestyle;hypertension ? ?   ?Objective:  ?  ?Today's Vitals  ? 05/02/21 0829  ?PainSc: 3   ? ?There is no height or weight on file to calculate BMI. ? ? ?  05/02/2021  ?  8:37 AM 02/28/2021  ?  1:07 PM 01/12/2021  ? 10:04 AM 11/26/2020  ? 11:51 AM 11/25/2020  ?  8:59 AM 06/29/2020  ?  9:10 PM 05/17/2020  ?  6:04 AM  ?Advanced Directives  ?Does Patient Have a Medical Advance Directive? No Yes Yes Yes No No No  ?Type of AScientist, physiologicalof AB[place]Living will Living will     ?Would patient like information on creating a medical advance directive? No - Patient declined  No - Patient declined  No - Patient declined No - Patient declined No - Patient declined  ? ? ?Current Medications (verified) ?Outpatient Encounter Medications as of 05/02/2021  ?Medication Sig  ? acetaminophen (TYLENOL) 650 MG CR tablet Take 650 mg by mouth every 8 (eight) hours as needed for pain.  ? apixaban (ELIQUIS) 5 MG TABS tablet Take 1 tablet (5 mg total) by mouth 2 (two) times daily.  ? Ascorbic Acid (VITAMIN C) 1000 MG tablet Take 1,000 mg by mouth daily.  ? Cranberry 500 MG CAPS Take 500 mg by mouth daily.  ? gabapentin (NEURONTIN) 600 MG tablet TAKE 1 TABLET BY MOUTH EVERYDAY AT BEDTIME  ? HYDROcodone-acetaminophen (NORCO/VICODIN) 5-325 MG tablet Take 1 tablet by mouth 2 (two)  times daily.  ? LORazepam (ATIVAN) 1 MG tablet TAKE 1 TABLET BY MOUTH EVERYDAY AT BEDTIME  ? Magnesium 400 MG TABS Take 400 mg by mouth daily.   ? MELATONIN PO Take by mouth.  ? midodrine (PROAMATINE) 10 MG tablet Take 1 tablet (10 mg total) by mouth 3 (three) times daily.  ? Multiple Vitamin (MULTIVITAMIN WITH MINERALS) TABS tablet Take 1 tablet by mouth daily. Centrum Silver  ? Omega-3 Fatty Acids (FISH OIL) 1200 MG CAPS Take 1,200 mg by mouth daily.  ? oxymetazoline (AFRIN) 0.05 % nasal spray Place 2 sprays into both nostrils at bedtime.  ? pantoprazole (PROTONIX) 20 MG tablet TAKE 1 TABLET BY MOUTH EVERY DAY  ? potassium chloride SA (KLOR-CON) 20 MEQ tablet Take 2 tablets (40 meq) x 1 dose as directed (Patient taking differently: Take 40 mEq by mouth daily as needed (with torsemide (fluid retention)).)  ? QUEtiapine Fumarate (SEROQUEL XR) 150 MG 24 hr tablet TAKE 1 TABLET BY MOUTH AT BEDTIME.  ? rOPINIRole (REQUIP) 0.25 MG tablet Take 0.25 mg by mouth 4 (four) times daily.  ? rosuvastatin (CRESTOR) 40 MG tablet TAKE 1 TABLET BY MOUTH EVERY DAY  ? solifenacin (VESICARE) 10 MG tablet TAKE 1 TABLET BY MOUTH EVERY DAY  ? tiZANidine (ZANAFLEX) 4 MG tablet Take 1 tablet (4 mg total) by mouth every 6 (six) hours as needed for muscle spasms.  ?  torsemide (DEMADEX) 20 MG tablet Take 40 mg by mouth daily as needed (fluid retention).  ? venlafaxine XR (EFFEXOR XR) 75 MG 24 hr capsule Take 3 capsules (225 mg total) by mouth daily.  ? Blood Glucose Monitoring Suppl (ONETOUCH VERIO) w/Device KIT Use to check blood sugar 3 times a day and document results, bring to appointments.  Goal is <130 fasting blood sugar and <180 two hours after meals. (Patient not taking: Reported on 05/02/2021)  ? Dulaglutide (TRULICITY) 6.19 JK/9.3OI SOPN Inject 0.75 mg into the skin once a week. (Patient not taking: Reported on 05/02/2021)  ? glucose blood test strip Use to check blood sugar 3 times a day and document results, bring to appointments.   Goal is <130 fasting blood sugar and <180 two hours after meals. (Patient not taking: Reported on 05/02/2021)  ? Lancets (ONETOUCH ULTRASOFT) lancets Use to check blood sugar 3 times a day and document results, bring to appointments.  Goal is <130 fasting blood sugar and <180 two hours after meals. (Patient not taking: Reported on 05/02/2021)  ? ?No facility-administered encounter medications on file as of 05/02/2021.  ? ? ?Allergies (verified) ?Patient has no known allergies.  ? ?History: ?Past Medical History:  ?Diagnosis Date  ? Abdominal pain, chronic, right lower quadrant 12/13/2020  ? Anterior urethral stricture   ? Anxiety   ? Arthritis   ? a. knees, hips, hands;  b. 11/2013 s/p L TKA @ [place].  ? Bile reflux gastritis   ? Bulging lumbar disc   ? BXO (balanitis xerotica obliterans)   ? Complete heart block ([place])   ? a. s/p MDT dual chamber (His bundle) pacemaker 01/2016 Dr Caryl Comes  ? Depression   ? DVT (deep venous thrombosis) ([place])   ? Erosive esophagitis   ? Gross hematuria   ? Hyperlipemia   ? Hypertension   ? borderline  ? Internal hemorrhoids   ? Phimosis   ? Pulmonary embolism ([place])   ? ?Past Surgical History:  ?Procedure Laterality Date  ? ATRIAL FIBRILLATION ABLATION N/A 05/17/2020  ? Procedure: ATRIAL FIBRILLATION ABLATION;  Surgeon: Vickie Epley, MD;  Location: [facility] [facility];  Service: Cardiovascular;  Laterality: N/A;  ? BUNIONECTOMY Bilateral 01/06/2015  ? Procedure: BUNIONECTOMY;  Surgeon: [name], MD;  Location: [facility] ORS;  Service: Orthopedics;  Laterality: Bilateral;  ? CARDIAC CATHETERIZATION  ~ 2005  ? "once"  ? CATARACT EXTRACTION W/ INTRAOCULAR LENS  IMPLANT, BILATERAL Bilateral ~ 2010  ? COLONOSCOPY WITH PROPOFOL N/A 12/07/2015  ? Procedure: COLONOSCOPY WITH PROPOFOL;  Surgeon: Lollie Sails, MD;  Location: [facility] ENDOSCOPY;  Service: Endoscopy;  Laterality: N/A;  ? COLONOSCOPY WITH PROPOFOL N/A 11/26/2020  ? Procedure: COLONOSCOPY WITH PROPOFOL;  Surgeon: Lin Landsman,  MD;  Location: [facility] ENDOSCOPY;  Service: Gastroenterology;  Laterality: N/A;  ? EP IMPLANTABLE DEVICE N/A 02/16/2016  ? MDT dual chamber (His Bundle) pacemaker implanted by Dr Caryl Comes for intermittent complete heart block  ? ESOPHAGOGASTRODUODENOSCOPY (EGD) WITH PROPOFOL N/A 12/07/2015  ? Procedure: ESOPHAGOGASTRODUODENOSCOPY (EGD) WITH PROPOFOL;  Surgeon: Lollie Sails, MD;  Location[facility];  Service: Endoscopy;  Laterality: N/A;  ? ESOPHAGOGASTRODUODENOSCOPY (EGD) WITH PROPOFOL N/A 05/17/2017  ? Procedure: ESOPHAGOGASTRODUODENOSCOPY (EGD) WITH PROPOFOL;  Surgeon: Lollie Sails, MD;  Location: [facility] ENDOSCOPY;  Service: Endoscopy;  Laterality: N/A;  ? HAMMER TOE SURGERY Bilateral 01/06/2015  ? Procedure: HAMMER TOE CORRECTION;  Surgeon: [name], MD;  Location: [facility] ORS;  Service: Orthopedics;  Laterality: Bilateral;  ? KNEE CARTILAGE  SURGERY Left 1965  ? "football injury"  ? Left Total Knee Arthroplasty    ? a. 11/2013 [facility].  ? PACEMAKER IMPLANT  2017  ? PILONIDAL CYST EXCISION  1970's  ? TOTAL HIP ARTHROPLASTY Right 2004  ? TOTAL HIP ARTHROPLASTY Left 2006  ? UPPER GI ENDOSCOPY    ? ?Family History  ?Problem Relation Age of Onset  ? Alcoholism Mother   ?     died in her 33's.  ? Cancer Mother   ? Stroke Father   ?     deceased  ? Diabetes Father   ? Hypertension Father   ? Glaucoma Sister   ? Breast cancer Sister   ? Diabetes Daughter   ? Asthma Son   ? Stroke Paternal Grandmother   ? Prostate cancer Paternal Grandfather   ? ?Social History  ? ?Socioeconomic History  ? Marital status: Married  ?  Spouse name: Not on file  ? Number of children: Not on file  ? Years of education: Not on file  ? Highest education level: High school graduate  ?Occupational History  ? Occupation: retired   ?Tobacco Use  ? Smoking status: Never  ? Smokeless tobacco: Never  ?Vaping Use  ? Vaping Use: Never used  ?Substance and Sexual Activity  ? Alcohol use: Not Currently  ? Drug use: No  ? Sexual activity: Not  Currently  ?Other Topics Concern  ? Not on file  ?Social History Narrative  ? Lives in [place] with wife.  Does not routinely exercise.  Activity severely limited by bilateral knee pain.  ? ?Social Determinant

## 2021-05-02 NOTE — Patient Instructions (Signed)
Nicholas [name] , ?Thank you for taking time to come for your Medicare Wellness Visit. I appreciate your ongoing commitment to your health goals. Please review the following plan we discussed and let me know if I can assist you in the future.  ? ?Screening recommendations/referrals: ?Colonoscopy: up to date ?Recommended yearly ophthalmology/optometry visit for glaucoma screening and checkup ?Recommended yearly dental visit for hygiene and checkup ? ?Vaccinations: ?Influenza vaccine: up to date ?Pneumococcal vaccine: up to date ?Tdap vaccine: up to   date ?Shingles vaccine:   Education provided ? ?Advanced directives: Education provided ? ?Conditions/risks identified:  ? ?Next appointment: 05-23-2021 @ 9:00 Cannady  05-09-2021 @ 9:00  pharmacy ? ?Preventive Care 51 Years and Older, Male ?Preventive care refers to lifestyle choices and visits with your health care provider that can promote health and wellness. ?What does preventive care include? ?A yearly physical exam. This is also called an annual well check. ?Dental exams once or twice a year. ?Routine eye exams. Ask your health care provider how often you should have your eyes checked. ?Personal lifestyle choices, including: ?Daily care of your teeth and gums. ?Regular physical activity. ?Eating a healthy diet. ?Avoiding tobacco and drug use. ?Limiting alcohol use. ?Practicing safe sex. ?Taking low doses of aspirin every day. ?Taking vitamin and mineral supplements as recommended by your health care provider. ?What happens during an annual well check? ?The services and screenings done by your health care provider during your annual well check will depend on your age, overall health, lifestyle risk factors, and family history of disease. ?Counseling  ?Your health care provider may ask you questions about your: ?Alcohol use. ?Tobacco use. ?Drug use. ?Emotional well-being. ?Home and relationship well-being. ?Sexual activity. ?Eating habits. ?History of falls. ?Memory and  ability to understand (cognition). ?Work and work Statistician. ?Screening  ?You may have the following tests or measurements: ?Height, weight, and BMI. ?Blood pressure. ?Lipid and cholesterol levels. These may be checked every 5 years, or more frequently if you are over 69 years old. ?Skin check. ?Lung cancer screening. You may have this screening every year starting at age 45 if you have a 30-pack-year history of smoking and currently smoke or have quit within the past 15 years. ?Fecal occult blood test (FOBT) of the stool. You may have this test every year starting at age 35. ?Flexible sigmoidoscopy or colonoscopy. You may have a sigmoidoscopy every 5 years or a colonoscopy every 10 years starting at age 83. ?Prostate cancer screening. Recommendations will vary depending on your family history and other risks. ?Hepatitis C blood test. ?Hepatitis B blood test. ?Sexually transmitted disease (STD) testing. ?Diabetes screening. This is done by checking your blood sugar (glucose) after you have not eaten for a while (fasting). You may have this done every 1-3 years. ?Abdominal aortic aneurysm (AAA) screening. You may need this if you are a current or former smoker. ?Osteoporosis. You may be screened starting at age 39 if you are at high risk. ?Talk with your health care provider about your test results, treatment options, and if necessary, the need for more tests. ?Vaccines  ?Your health care provider may recommend certain vaccines, such as: ?Influenza vaccine. This is recommended every year. ?Tetanus, diphtheria, and acellular pertussis (Tdap, Td) vaccine. You may need a Td booster every 10 years. ?Zoster vaccine. You may need this after age 43. ?Pneumococcal 13-valent conjugate (PCV13) vaccine. One dose is recommended after age 77. ?Pneumococcal polysaccharide (PPSV23) vaccine. One dose is recommended after age 45. ?Talk to your  health care provider about which screenings and vaccines you need and how often you need  them. ?This information is not intended to replace advice given to you by your health care provider. Make sure you discuss any questions you have with your health care provider. ?Document Released: 02/05/2015 Document Revised: 09/29/2015 Document Reviewed: 11/10/2014 ?Elsevier Interactive Patient Education ? 2017 [place]. ? ?Fall Prevention in the Home ?Falls can cause injuries. They can happen to people of all ages. There are many things you can do to make your home safe and to help prevent falls. ?What can I do on the outside of my home? ?Regularly fix the edges of walkways and driveways and fix any cracks. ?Remove anything that might make you trip as you walk through a door, such as a raised step or threshold. ?Trim any bushes or trees on the path to your home. ?Use bright outdoor lighting. ?Clear any walking paths of anything that might make someone trip, such as rocks or tools. ?Regularly check to see if handrails are loose or broken. Make sure that both sides of any steps have handrails. ?Any raised decks and porches should have guardrails on the edges. ?Have any leaves, snow, or ice cleared regularly. ?Use sand or salt on walking paths during winter. ?Clean up any spills in your garage right away. This includes oil or grease spills. ?What can I do in the bathroom? ?Use night lights. ?Install grab bars by the toilet and in the tub and shower. Do not use towel bars as grab bars. ?Use non-skid mats or decals in the tub or shower. ?If you need to sit down in the shower, use a plastic, non-slip stool. ?Keep the floor dry. Clean up any water that spills on the floor as soon as it happens. ?Remove soap buildup in the tub or shower regularly. ?Attach bath mats securely with double-sided non-slip rug tape. ?Do not have throw rugs and other things on the floor that can make you trip. ?What can I do in the bedroom? ?Use night lights. ?Make sure that you have a light by your bed that is easy to reach. ?Do not use  any sheets or blankets that are too big for your bed. They should not hang down onto the floor. ?Have a firm chair that has side arms. You can use this for support while you get dressed. ?Do not have throw rugs and other things on the floor that can make you trip. ?What can I do in the kitchen? ?Clean up any spills right away. ?Avoid walking on wet floors. ?Keep items that you use a lot in easy-to-reach places. ?If you need to reach something above you, use a strong step stool that has a grab bar. ?Keep electrical cords out of the way. ?Do not use floor polish or wax that makes floors slippery. If you must use wax, use non-skid floor wax. ?Do not have throw rugs and other things on the floor that can make you trip. ?What can I do with my stairs? ?Do not leave any items on the stairs. ?Make sure that there are handrails on both sides of the stairs and use them. Fix handrails that are broken or loose. Make sure that handrails are as long as the stairways. ?Check any carpeting to make sure that it is firmly attached to the stairs. Fix any carpet that is loose or worn. ?Avoid having throw rugs at the top or bottom of the stairs. If you do have throw rugs, attach them  to the floor with carpet tape. ?Make sure that you have a light switch at the top of the stairs and the bottom of the stairs. If you do not have them, ask someone to add them for you. ?What else can I do to help prevent falls? ?Wear shoes that: ?Do not have high heels. ?Have rubber bottoms. ?Are comfortable and fit you well. ?Are closed at the toe. Do not wear sandals. ?If you use a stepladder: ?Make sure that it is fully opened. Do not climb a closed stepladder. ?Make sure that both sides of the stepladder are locked into place. ?Ask someone to hold it for you, if possible. ?Clearly mark and make sure that you can see: ?Any grab bars or handrails. ?First and last steps. ?Where the edge of each step is. ?Use tools that help you move around (mobility aids)  if they are needed. These include: ?Canes. ?Walkers. ?Scooters. ?Crutches. ?Turn on the lights when you go into a dark area. Replace any light bulbs as soon as they burn out. ?Set up your furniture so you

## 2021-05-05 ENCOUNTER — Ambulatory Visit: Payer: Self-pay

## 2021-05-05 NOTE — Telephone Encounter (Signed)
Noted, if worsening symptoms or plan go immediately to ER. ?

## 2021-05-05 NOTE — Telephone Encounter (Signed)
Ilda Foil with [facility] called to reports she had a visit with pt. Today and he disclosed to her he has suicidal thoughts daily. Does not have a plan and no intent today. Wife states PCP is aware of pt.'s thoughts.Left messages on home and mobile phone to call the practice and speak with a nurse.   ?

## 2021-05-05 NOTE — Progress Notes (Signed)
? ?Chronic Care Management ?Pharmacy Note ? ?05/09/2021 ?Name:  Nicholas [name] MRN:  [id number] DOB:  Nov 21, 1945 ? ?Summary: ?Cost concerns w/ trulicity and ozempic.  ?PAP: Ozempic income cut off 58k 1 person/78 k 2 person (400% [employer])  ?Does not qualify as of 05/09/21 ?Previously reviewed eliquis: ?2023 requirements: $43,740 (1)/ $59,160 (2), 3% out of pocket on own medications starting January 1st this year ?Would not qualify for this PAP either, getting at ok price through the mail  ?Subjective: ?Nicholas [name] is an 76 y.o. year old male who is a primary patient of Cannady, Barbaraann Faster, NP.  The CCM team was consulted for assistance with disease management and care coordination needs.   ? ?Engaged with patient by telephone for follow up visit in response to provider referral for pharmacy case management and/or care coordination services.  ? ?Consent to Services:  ?The patient was given information about Chronic Care Management services, agreed to services, and gave verbal consent prior to initiation of services.  Please see initial visit note for detailed documentation.  ? ?Patient Care Team: ?Venita Lick, NP as PCP - General (Nurse Practitioner) ?Vickie Epley, MD as PCP - Electrophysiology (Cardiology) ?Deboraha Sprang, MD as PCP - Cardiology (Cardiology) ?[name], MD (Specialist) ?Hollice Espy, MD as Consulting Physician (Urology) ?Tanda Rockers, MD as Consulting Physician (Pulmonary Disease) ?[name], MD as Consulting Physician (Anesthesiology) ?Deboraha Sprang, MD as Consulting Physician (Cardiology) ?Greg Cutter, LCSW as Education officer, museum (Licensed Holiday representative) ?Madelin Rear, [facility] as Pharmacist (General Practice) ? ?Hospital visits: ?None in previous 6 months ? ?Objective: ? ?Lab Results  ?Component Value Date  ? CREATININE 0.98 03/23/2021  ? CREATININE 1.14 02/21/2021  ? CREATININE 1.03 11/16/2020  ? ? ?Lab Results  ?Component Value Date  ? HGBA1C 6.6 (H) 02/21/2021  ? HGBA1C  6.0 (H) 11/16/2020  ? HGBA1C 6.5 08/11/2020  ? ?Last diabetic Eye exam: No results found for: HMDIABEYEEXA  ?Last diabetic Foot exam: No results found for: HMDIABFOOTEX  ? ?   ?Component Value Date/Time  ? CHOL 154 02/21/2021 1108  ? CHOL 140 08/11/2020 0843  ? CHOL 171 02/11/2020 0942  ? CHOL 198 01/03/2018 1610  ? CHOL 181 08/15/2016 0000  ? CHOL 178 10/20/2014 1600  ? TRIG 127 02/21/2021 1108  ? TRIG 118 08/11/2020 0843  ? TRIG 147 02/11/2020 0942  ? TRIG 264 (H) 01/03/2018 1610  ? TRIG 107 08/15/2016 0000  ? TRIG 147 10/20/2014 1600  ? HDL 65 02/21/2021 1108  ? HDL 54 08/11/2020 0843  ? HDL 61 02/11/2020 0942  ? CHOLHDL 3.6 07/16/2018 0813  ? VLDL 53 (H) 01/03/2018 1610  ? [place] 67 02/21/2021 1108  ? [place] 65 08/11/2020 0843  ? [place] 85 02/11/2020 0942  ? ? ? ?  Latest Ref Rng & Units 03/23/2021  ?  9:25 AM 02/21/2021  ? 11:08 AM 11/16/2020  ?  9:14 AM  ?Hepatic Function  ?Total Protein 6.0 - 8.5 g/dL 7.0   7.1   6.8    ?Albumin 3.7 - 4.7 g/dL 4.5   4.6   4.9    ?AST 0 - 40 IU/L '27   26   31    ' ?ALT 0 - 44 IU/L '28   24   20    ' ?Alk Phosphatase 44 - 121 IU/L 56   72   60    ?Total Bilirubin 0.0 - 1.2 mg/dL 0.6   0.7  0.5    ? ? ?Lab Results  ?Component Value Date/Time  ? TSH 1.420 02/21/2021 11:08 AM  ? TSH 2.180 08/11/2020 08:43 AM  ? ? ? ?  Latest Ref Rng & Units 03/23/2021  ?  9:25 AM 02/21/2021  ? 11:08 AM 11/16/2020  ?  9:14 AM  ?CBC  ?WBC 3.4 - 10.8 x10E3/uL 6.3   7.4   5.7    ?Hemoglobin 13.0 - 17.7 g/dL 15.1   16.0   15.3    ?Hematocrit 37.5 - 51.0 % 46.0   47.9   46.4    ?Platelets 150 - 450 x10E3/uL 172   200   170    ? ? ?No results found for: VD25OH ? ?Clinical ASCVD:  ?The 10-year ASCVD risk score (Arnett DK, et al., 2019) is: 32.6% ?  Values used to calculate the score: ?    Age: 76 years ?    Sex: Male ?    Is Non-Hispanic African American: No ?    Diabetic: Yes ?    Tobacco smoker: No ?    Systolic Blood Pressure: 518 mmHg ?    Is BP treated: Yes ?    HDL Cholesterol: 65 mg/dL ?    Total  Cholesterol: 154 mg/dL   ?Social History  ? ?Tobacco Use  ?Smoking Status Never  ?Smokeless Tobacco Never  ? ?BP Readings from Last 3 Encounters:  ?04/05/21 120/80  ?03/29/21 113/70  ?03/23/21 118/82  ? ?Pulse Readings from Last 3 Encounters:  ?04/05/21 95  ?03/29/21 (!) 104  ?03/23/21 83  ? ?Wt Readings from Last 3 Encounters:  ?04/05/21 279 lb (126.6 kg)  ?03/29/21 280 lb (127 kg)  ?03/23/21 284 lb 3.2 oz (128.9 kg)  ? ?BMI Readings from Last 3 Encounters:  ?04/05/21 40.03 kg/m?  ?03/29/21 40.18 kg/m?  ?03/23/21 40.78 kg/m?  ? ? ?Assessment: Review of patient past medical history, allergies, medications, health status, including review of consultants reports, laboratory and other test data, was performed as part of comprehensive evaluation and provision of chronic care management services.  ? ?SDOH:  (Social Determinants of Health) assessments and interventions performed: Yes ? ? ?[place] ? ?No Known Allergies ? ?Medications Reviewed Today   ? ? Reviewed by Madelin Rear, [facility] (Pharmacist) on 05/09/21 at [phone]  Med List Status: <None>  ? ?Medication Order Taking? Sig Documenting Provider Last Dose Status Informant  ?acetaminophen (TYLENOL) 650 MG CR tablet [id number] No Take 650 mg by mouth every 8 (eight) hours as needed for pain. [provider] Taking Active Spouse/Significant Other  ?apixaban (ELIQUIS) 5 MG TABS tablet [id number] No Take 1 tablet (5 mg total) by mouth 2 (two) times daily. Deboraha Sprang, MD Taking Active   ?Ascorbic Acid (VITAMIN C) 1000 MG tablet [id number] No Take 1,000 mg by mouth daily. [provider] Taking Active Spouse/Significant Other  ?Blood Glucose Monitoring Suppl (ONETOUCH VERIO) w/Device KIT [id number] No Use to check blood sugar 3 times a day and document results, bring to appointments.  Goal is <130 fasting blood sugar and <180 two hours after meals.  ?Patient not taking: Reported on 05/02/2021  ? Venita Lick, NP Not Taking Active   ?Cranberry 500 MG  CAPS [id number] No Take 500 mg by mouth daily. [provider] Taking Active Spouse/Significant Other  ?Dulaglutide (TRULICITY) 7.10 GY/6.9SW SOPN [id number] No Inject 0.75 mg into the skin once a week.  ?Patient not taking: Reported on 05/02/2021  ? Venita Lick, NP Not Taking  Active   ?gabapentin (NEURONTIN) 600 MG tablet [id number] No TAKE 1 TABLET BY MOUTH EVERYDAY AT BEDTIME Cannady, Jolene T, NP Taking Active   ?glucose blood test strip [id number] No Use to check blood sugar 3 times a day and document results, bring to appointments.  Goal is <130 fasting blood sugar and <180 two hours after meals.  ?Patient not taking: Reported on 05/02/2021  ? Venita Lick, NP Not Taking Active   ?HYDROcodone-acetaminophen (NORCO/VICODIN) 5-325 MG tablet [id number] No Take 1 tablet by mouth 2 (two) times daily. [name], MD Taking Active   ?Lancets (ONETOUCH ULTRASOFT) lancets [id number] No Use to check blood sugar 3 times a day and document results, bring to appointments.  Goal is <130 fasting blood sugar and <180 two hours after meals.  ?Patient not taking: Reported on 05/02/2021  ? Venita Lick, NP Not Taking Active   ?LORazepam (ATIVAN) 1 MG tablet [id number] No TAKE 1 TABLET BY MOUTH EVERYDAY AT BEDTIME Cannady, Jolene T, NP Taking Active   ?Magnesium 400 MG TABS [id number] No Take 400 mg by mouth daily.  [provider] Taking Active Spouse/Significant Other  ?MELATONIN PO [id number] No Take by mouth. [provider] Taking Active   ?midodrine (PROAMATINE) 10 MG tablet [id number] No Take 1 tablet (10 mg total) by mouth 3 (three) times daily. Deboraha Sprang, MD Taking Active   ?Multiple Vitamin (MULTIVITAMIN WITH MINERALS) TABS tablet [id number] No Take 1 tablet by mouth daily. Centrum Silver [provider] Taking Active Spouse/Significant Other  ?Omega-3 Fatty Acids (FISH OIL) 1200 MG CAPS [id number] No Take 1,200 mg by mouth daily. [provider] Taking Active  Spouse/Significant Other  ?oxymetazoline (AFRIN) 0.05 % nasal spray [id number] No Place 2 sprays into both nostrils at bedtime. [provider] Taking Active Spouse/Significant Other  ?pantoprazole (PROTONIX)

## 2021-05-05 NOTE — Telephone Encounter (Signed)
Patient was given appt on 4/21 at 11:20a with PCP. ?

## 2021-05-05 NOTE — Patient Instructions (Addendum)
[name], ? ?Thank you for talking with me today. I have included our care plan/goals in the following pages.  ? ?Please review and call me at [phone] with any questions. ? ?Thanks! ?Edison Nasuti  ? ?Madelin Rear, PharmD ?Clinical Pharmacist  ?[phone] ? ?Care Plan : [facility] pharmacy care plan  ?Updates made by Madelin Rear, [facility] since 05/09/2021 12:00 AM  ?  ? ?Problem: afid hld htn gad mdd aaa   ?Priority: High  ?  ? ?Long-Range Goal: disease management   ?Start Date: 05/09/2021  ?Expected End Date: 10/25/2021  ?Recent Progress: On track  ?Priority: High  ?Note:   ? ?Current Barriers:  ?Unable to independently afford treatment regimen ? ?Pharmacist Clinical Goal(s):  ?Patient will verbalize ability to afford treatment regimen ?achieve adherence to monitoring guidelines and medication adherence to achieve therapeutic efficacy through collaboration with PharmD and provider.  ? ?Interventions: ?1:1 collaboration with Venita Lick, NP regarding development and update of comprehensive plan of care as evidenced by provider attestation and co-signature ?Inter-disciplinary care team collaboration (see longitudinal plan of care) ?Comprehensive medication review performed; medication list updated in electronic medical record ? ?Diabetes (A1c goal <7%) ?-Controlled ?-Had been doing well with ozempic, really helped with appetite but stopped d/t cost concerns. Trulcity copay was not better than ozempic. Might willing to start back on ozempic and accept the cost.  ?-Current medications: ?Trulicity - (active but not currently taking. Rx Copay worse than Ozempic)  ?-Medications previously tried: none noted  ?-Current home glucose readings:  ?fasting glucose: 150s without GLP1, previously less than goal of 130.  ?post prandial glucose: n/a ?-Denies hypoglycemic/hyperglycemic symptoms ?-Current meal patterns: high carb diet, aware of which food might be high in carbs. Struggling with increased appepite off of ozempic.   ?-Educated on A1c and blood sugar goals; ?Benefits of weight loss; ?-Counseled to check feet daily and get yearly eye exams ?-Recommended to continue current medication ?Assessed patient finances. Does not qualify for patient assistance. Plan to have patient start back on ozempic and hopefully be able to manage financially  ? ? ?Hyperlipidemia: (LDL goal < 70) ?-Controlled ?-BMI ~40, CKD, DM, HTN ?-Current treatment: ?Rosuvastatin 40 mg once daily  ?-Side effect review - no problems noted, tolerating well. No adherence concerns  ?-Educated on Cholesterol goals;  ?Benefits of statin for ASCVD risk reduction; ?-Recommended to continue current medication ? ?Atrial Fibrillation (Goal: prevent stroke and major bleeding) ?-Controlled ?-s/p ablation 2022 ?-Current treatment: ?Rate control: n/a ?Anticoagulation: Eliquis 5 mg twice daily  ?-Medications previously tried: flecanide ?-Home BP and HR readings: none provded  ?-Counseled on increased risk of stroke due to Afib and benefits of anticoagulation for stroke prevention; ?04/2021. Reviewed finances - would not qualify for patient assistance due to annual income.  ?-Recommended to continue current medication ?  ?Diabetes Mellitus and Nutrition, Adult ?When you have diabetes, or diabetes mellitus, it is very important to have healthy eating habits because your blood sugar (glucose) levels are greatly affected by what you eat and drink. Eating healthy foods in the right amounts, at about the same times every day, can help you: ?Manage your blood glucose. ?Lower your risk of heart disease. ?Improve your blood pressure. ?Reach or maintain a healthy weight. ?What can affect my meal plan? ?Every person with diabetes is different, and each person has different needs for a meal plan. Your health care provider may recommend that you work with a dietitian to make a meal plan that is best for you.  Your meal plan may vary depending on factors such as: ?The calories you need. ?The  medicines you take. ?Your weight. ?Your blood glucose, blood pressure, and cholesterol levels. ?Your activity level. ?Other health conditions you have, such as heart or kidney disease. ?How do carbohydrates affect me? ?Carbohydrates, also called carbs, affect your blood glucose level more than any other type of food. Eating carbs raises the amount of glucose in your blood. ?It is important to know how many carbs you can safely have in each meal. This is different for every person. Your dietitian can help you calculate how many carbs you should have at each meal and for each snack. ?How does alcohol affect me? ?Alcohol can cause a decrease in blood glucose (hypoglycemia), especially if you use insulin or take certain diabetes medicines by mouth. Hypoglycemia can be a life-threatening condition. Symptoms of hypoglycemia, such as sleepiness, dizziness, and confusion, are similar to symptoms of having too much alcohol. ?Do not drink alcohol if: ?Your health care provider tells you not to drink. ?You are pregnant, may be pregnant, or are planning to become pregnant. ?If you drink alcohol: ?Limit how much you have to: ?0-1 drink a day for women. ?0-2 drinks a day for men. ?Know how much alcohol is in your drink. In the [place] one drink equals one 12 oz bottle of beer (355 mL), one 5 oz glass of wine (148 mL), or one 1? oz glass of hard liquor (44 mL). ?Keep yourself hydrated with water, diet soda, or unsweetened iced tea. Keep in mind that regular soda, juice, and other mixers may contain a lot of sugar and must be counted as carbs. ?What are tips for following this plan? ? ?Reading food labels ?Start by checking the serving size on the Nutrition Facts label of packaged foods and drinks. The number of calories and the amount of carbs, fats, and other nutrients listed on the label are based on one serving of the item. Many items contain more than one serving per package. ?Check the total grams (g) of carbs in one  serving. ?Check the number of grams of saturated fats and trans fats in one serving. Choose foods that have a low amount or none of these fats. ?Check the number of milligrams (mg) of salt (sodium) in one serving. Most people should limit total sodium intake to less than 2,300 mg per day. ?Always check the nutrition information of foods labeled as "low-fat" or "nonfat." These foods may be higher in added sugar or refined carbs and should be avoided. ?Talk to your dietitian to identify your daily goals for nutrients listed on the label. ?Shopping ?Avoid buying canned, pre-made, or processed foods. These foods tend to be high in fat, sodium, and added sugar. ?Shop around the outside edge of the grocery store. This is where you will most often find fresh fruits and vegetables, bulk grains, fresh meats, and fresh dairy products. ?Cooking ?Use low-heat cooking methods, such as baking, instead of high-heat cooking methods, such as deep frying. ?Cook using healthy oils, such as olive, canola, or sunflower oil. ?Avoid cooking with butter, cream, or high-fat meats. ?Meal planning ?Eat meals and snacks regularly, preferably at the same times every day. Avoid going long periods of time without eating. ?Eat foods that are high in fiber, such as fresh fruits, vegetables, beans, and whole grains. ?Eat 4-6 oz (112-168 g) of lean protein each day, such as lean meat, chicken, fish, eggs, or tofu. One ounce (oz) (28 g) of lean  protein is equal to: ?1 oz (28 g) of meat, chicken, or fish. ?1 egg. ?? cup (62 g) of tofu. ?Eat some foods each day that contain healthy fats, such as avocado, nuts, seeds, and fish. ?What foods should I eat? ?Fruits ?Berries. Apples. Oranges. Peaches. Apricots. Plums. Grapes. Mangoes. Papayas. Pomegranates. Kiwi. Cherries. ?Vegetables ?Leafy greens, including lettuce, spinach, kale, chard, collard greens, mustard greens, and cabbage. Beets. Cauliflower. Broccoli. Carrots. Green beans. Tomatoes. Peppers.  Onions. Cucumbers. Brussels sprouts. ?Grains ?Whole grains, such as whole-wheat or whole-grain bread, crackers, tortillas, cereal, and pasta. Unsweetened oatmeal. Quinoa. Brown or wild rice. ?Meats and other proteins ?Seafood.

## 2021-05-09 ENCOUNTER — Ambulatory Visit (INDEPENDENT_AMBULATORY_CARE_PROVIDER_SITE_OTHER): Payer: Medicare Other

## 2021-05-09 ENCOUNTER — Telehealth: Payer: Medicare Other

## 2021-05-09 DIAGNOSIS — I480 Paroxysmal atrial fibrillation: Secondary | ICD-10-CM

## 2021-05-09 DIAGNOSIS — E669 Obesity, unspecified: Secondary | ICD-10-CM

## 2021-05-09 DIAGNOSIS — E1169 Type 2 diabetes mellitus with other specified complication: Secondary | ICD-10-CM

## 2021-05-11 ENCOUNTER — Ambulatory Visit: Payer: Self-pay | Admitting: *Deleted

## 2021-05-11 NOTE — Telephone Encounter (Signed)
LVM asking pt to call back to receive these instructions from [facility]. ? ?OK for PEC/Nurse Triage to give patient the message. ?

## 2021-05-11 NOTE — Telephone Encounter (Signed)
Summary: Med management.  ? Pt stated he took [place] for 2 months. However, he stopped taking them due to price. Pt stated he has been off for three weeks wants to get back on medication but unsure on what dose to start on and would like to discuss.  ? ?Pt seeking clinical advice.   ?  ? ?Chief Complaint: stopped Ozempic but wants to go back on ?Symptoms: none it was the cost ?Frequency: wants to know what dose ?Pertinent Negatives: Patient denies symptoms ?Disposition: '[]'$ ED /'[]'$ Urgent Care (no appt availability in office) / '[]'$ Appointment(In office/virtual)/ '[]'$  [facility]/ '[]'$ Home Care/ '[]'$ Refused Recommended Disposition /'[]'$ [facility]/ '[]'$  Follow-up with PCP ?Additional Notes: Pt going back on Ozempic. Was off for three weeks. Started at 0.25 and when last take 3 weeks ago he took 0.5. Pt would like a call back about what dose to start back on. ? ?Reason for Disposition ? [1] Caller has NON-URGENT medicine question about med that PCP prescribed AND [2] triager unable to answer question ? ?Answer Assessment - Initial Assessment Questions ?1. NAME of MEDICATION: "What medicine are you calling about?" ?    Ozempic ?2. QUESTION: "What is your question?" (e.g., double dose of medicine, side effect) ?    Stopped taking it 3 weeks ago and wants to restart, unsure of dose. ?3. PRESCRIBING HCP: "Who prescribed it?" Reason: if prescribed by specialist, call should be referred to that group. ?    Venetia Constable Started at 0.25 was up to .5 ?4. SYMPTOMS: "Do you have any symptoms?" ?    Came off due to price, no symptoms ?5. SEVERITY: If symptoms are present, ask "Are they mild, moderate or severe?" ?    na ?6. PREGNANCY:  "Is there any chance that you are pregnant?" "When was your last menstrual period?" ?    na ? ?Protocols used: Medication Question Call-A-AH ? ?

## 2021-05-12 LAB — HM DIABETES EYE EXAM

## 2021-05-12 NOTE — Telephone Encounter (Signed)
Advice form [name] NP given to pt. Pt verbalized understanding. ?

## 2021-05-12 NOTE — Telephone Encounter (Signed)
Pt advised what dose to restart Ozempic asa per note of [name] NP written on ?

## 2021-05-13 ENCOUNTER — Ambulatory Visit: Payer: Medicare Other | Admitting: Nurse Practitioner

## 2021-05-21 NOTE — Patient Instructions (Addendum)
Coricidin for cough ? ?Managing Anxiety, Adult ?After being diagnosed with anxiety, you may be relieved to know why you have felt or behaved a certain way. You may also feel overwhelmed about the treatment ahead and what it will mean for your life. With care and support, you can manage this condition. ?How to manage lifestyle changes ?Managing stress and anxiety ? ?Stress is your body's reaction to life changes and events, both good and bad. Most stress will last just a few hours, but stress can be ongoing and can lead to more than just stress. Although stress can play a major role in anxiety, it is not the same as anxiety. Stress is usually caused by something external, such as a deadline, test, or competition. Stress normally passes after the triggering event has ended.  ?Anxiety is caused by something internal, such as imagining a terrible outcome or worrying that something will go wrong that will devastate you. Anxiety often does not go away even after the triggering event is over, and it can become long-term (chronic) worry. It is important to understand the differences between stress and anxiety and to manage your stress effectively so that it does not lead to an anxious response. ?Talk with your health care provider or a counselor to learn more about reducing anxiety and stress. He or she may suggest tension reduction techniques, such as: ?Music therapy. Spend time creating or listening to music that you enjoy and that inspires you. ?Mindfulness-based meditation. Practice being aware of your normal breaths while not trying to control your breathing. It can be done while sitting or walking. ?Centering prayer. This involves focusing on a word, phrase, or sacred image that means something to you and brings you peace. ?Deep breathing. To do this, expand your stomach and inhale slowly through your nose. Hold your breath for 3-5 seconds. Then exhale slowly, letting your stomach muscles relax. ?Self-talk. Learn to  notice and identify thought patterns that lead to anxiety reactions and change those patterns to thoughts that feel peaceful. ?Muscle relaxation. Taking time to tense muscles and then relax them. ?Choose a tension reduction technique that fits your lifestyle and personality. These techniques take time and practice. Set aside 5-15 minutes a day to do them. Therapists can offer counseling and training in these techniques. The training to help with anxiety may be covered by some insurance plans. ?Other things you can do to manage stress and anxiety include: ?Keeping a stress diary. This can help you learn what triggers your reaction and then learn ways to manage your response. ?Thinking about how you react to certain situations. You may not be able to control everything, but you can control your response. ?Making time for activities that help you relax and not feeling guilty about spending your time in this way. ?Doing visual imagery. This involves imagining or creating mental pictures to help you relax. ?Practicing yoga. Through yoga poses, you can lower tension and promote relaxation. ? ?Medicines ?Medicines can help ease symptoms. Medicines for anxiety include: ?Antidepressant medicines. These are usually prescribed for long-term daily control. ?Anti-anxiety medicines. These may be added in severe cases, especially when panic attacks occur. ?Medicines will be prescribed by a health care provider. When used together, medicines, psychotherapy, and tension reduction techniques may be the most effective treatment. ?Relationships ?Relationships can play a big part in helping you recover. Try to spend more time connecting with trusted friends and family members. ?Consider going to couples counseling if you have a partner, taking family education  classes, or going to family therapy. ?Therapy can help you and others better understand your condition. ?How to recognize changes in your anxiety ?Everyone responds differently to  treatment for anxiety. Recovery from anxiety happens when symptoms decrease and stop interfering with your daily activities at home or work. This may mean that you will start to: ?Have better concentration and focus. Worry will interfere less in your daily thinking. ?Sleep better. ?Be less irritable. ?Have more energy. ?Have improved memory. ?It is also important to recognize when your condition is getting worse. Contact your health care provider if your symptoms interfere with home or work and you feel like your condition is not improving. ?Follow these instructions at home: ?Activity ?Exercise. Adults should do the following: ?Exercise for at least 150 minutes each week. The exercise should increase your heart rate and make you sweat (moderate-intensity exercise). ?Strengthening exercises at least twice a week. ?Get the right amount and quality of sleep. Most adults need 7-9 hours of sleep each night. ?Lifestyle ? ?Eat a healthy diet that includes plenty of vegetables, fruits, whole grains, low-fat dairy products, and lean protein. ?Do not eat a lot of foods that are high in fats, added sugars, or salt (sodium). ?Make choices that simplify your life. ?Do not use any products that contain nicotine or tobacco. These products include cigarettes, chewing tobacco, and vaping devices, such as e-cigarettes. If you need help quitting, ask your health care provider. ?Avoid caffeine, alcohol, and certain over-the-counter cold medicines. These may make you feel worse. Ask your pharmacist which medicines to avoid. ?General instructions ?Take over-the-counter and prescription medicines only as told by your health care provider. ?Keep all follow-up visits. This is important. ?Where to find support ?You can get help and support from these sources: ?Self-help groups. ?Online and [employer]. ?A trusted spiritual leader. ?Couples counseling. ?Family education classes. ?Family therapy. ?Where to find more  information ?You may find that joining a support group helps you deal with your anxiety. The following sources can help you locate counselors or support groups near you: ?[place]: www.mentalhealthamerica.net ?Anxiety and Depression Association of America (ADAA): https://www.clark.net/ ?National Alliance on Mental Illness (NAMI): www.nami.org ?Contact a health care provider if: ?You have a hard time staying focused or finishing daily tasks. ?You spend many hours a day feeling worried about everyday life. ?You become exhausted by worry. ?You start to have headaches or frequently feel tense. ?You develop chronic nausea or diarrhea. ?Get help right away if: ?You have a racing heart and shortness of breath. ?You have thoughts of hurting yourself or others. ?If you ever feel like you may hurt yourself or others, or have thoughts about taking your own life, get help right away. Go to your nearest emergency department or: ?Call your local emergency services (911 in the [place] ?Call a suicide crisis helpline, such as the [place] at 415-036-1107 or 988 in the [place]. This is open 24 hours a day in the [place] ?Text the Crisis Text Line at 640-394-0103 (in the [place].). ?Summary ?Taking steps to learn and use tension reduction techniques can help calm you and help prevent triggering an anxiety reaction. ?When used together, medicines, psychotherapy, and tension reduction techniques may be the most effective treatment. ?Family, friends, and partners can play a big part in supporting you. ?This information is not intended to replace advice given to you by your health care provider. Make sure you discuss any questions you have with your health care provider. ?Document Revised: 08/04/2020  Document Reviewed: 05/02/2020 ?Elsevier Patient Education ? [place]. ? ?

## 2021-05-22 DIAGNOSIS — E1169 Type 2 diabetes mellitus with other specified complication: Secondary | ICD-10-CM

## 2021-05-22 DIAGNOSIS — E669 Obesity, unspecified: Secondary | ICD-10-CM

## 2021-05-22 DIAGNOSIS — E785 Hyperlipidemia, unspecified: Secondary | ICD-10-CM

## 2021-05-22 DIAGNOSIS — I480 Paroxysmal atrial fibrillation: Secondary | ICD-10-CM

## 2021-05-23 ENCOUNTER — Ambulatory Visit: Payer: Medicare Other | Attending: Anesthesiology | Admitting: Anesthesiology

## 2021-05-23 ENCOUNTER — Encounter: Payer: Self-pay | Admitting: Nurse Practitioner

## 2021-05-23 ENCOUNTER — Encounter: Payer: Self-pay | Admitting: Anesthesiology

## 2021-05-23 ENCOUNTER — Ambulatory Visit (INDEPENDENT_AMBULATORY_CARE_PROVIDER_SITE_OTHER): Payer: Medicare Other | Admitting: Nurse Practitioner

## 2021-05-23 VITALS — BP 136/81 | HR 87 | Temp 98.6°F | Ht 70.0 in | Wt 282.6 lb

## 2021-05-23 DIAGNOSIS — G8929 Other chronic pain: Secondary | ICD-10-CM

## 2021-05-23 DIAGNOSIS — M5416 Radiculopathy, lumbar region: Secondary | ICD-10-CM

## 2021-05-23 DIAGNOSIS — E1169 Type 2 diabetes mellitus with other specified complication: Secondary | ICD-10-CM | POA: Diagnosis not present

## 2021-05-23 DIAGNOSIS — G894 Chronic pain syndrome: Secondary | ICD-10-CM

## 2021-05-23 DIAGNOSIS — I700 Atherosclerosis of aorta: Secondary | ICD-10-CM

## 2021-05-23 DIAGNOSIS — M47816 Spondylosis without myelopathy or radiculopathy, lumbar region: Secondary | ICD-10-CM

## 2021-05-23 DIAGNOSIS — R1031 Right lower quadrant pain: Secondary | ICD-10-CM

## 2021-05-23 DIAGNOSIS — I7143 Infrarenal abdominal aortic aneurysm, without rupture: Secondary | ICD-10-CM

## 2021-05-23 DIAGNOSIS — I480 Paroxysmal atrial fibrillation: Secondary | ICD-10-CM

## 2021-05-23 DIAGNOSIS — M5136 Other intervertebral disc degeneration, lumbar region: Secondary | ICD-10-CM | POA: Diagnosis not present

## 2021-05-23 DIAGNOSIS — M5431 Sciatica, right side: Secondary | ICD-10-CM

## 2021-05-23 DIAGNOSIS — E1159 Type 2 diabetes mellitus with other circulatory complications: Secondary | ICD-10-CM | POA: Diagnosis not present

## 2021-05-23 DIAGNOSIS — G4733 Obstructive sleep apnea (adult) (pediatric): Secondary | ICD-10-CM

## 2021-05-23 DIAGNOSIS — M5432 Sciatica, left side: Secondary | ICD-10-CM

## 2021-05-23 DIAGNOSIS — N1831 Chronic kidney disease, stage 3a: Secondary | ICD-10-CM

## 2021-05-23 DIAGNOSIS — F1190 Opioid use, unspecified, uncomplicated: Secondary | ICD-10-CM | POA: Diagnosis not present

## 2021-05-23 DIAGNOSIS — I152 Hypertension secondary to endocrine disorders: Secondary | ICD-10-CM

## 2021-05-23 DIAGNOSIS — D6869 Other thrombophilia: Secondary | ICD-10-CM

## 2021-05-23 DIAGNOSIS — I442 Atrioventricular block, complete: Secondary | ICD-10-CM

## 2021-05-23 DIAGNOSIS — F419 Anxiety disorder, unspecified: Secondary | ICD-10-CM

## 2021-05-23 DIAGNOSIS — E785 Hyperlipidemia, unspecified: Secondary | ICD-10-CM

## 2021-05-23 DIAGNOSIS — Z79899 Other long term (current) drug therapy: Secondary | ICD-10-CM

## 2021-05-23 DIAGNOSIS — M1611 Unilateral primary osteoarthritis, right hip: Secondary | ICD-10-CM

## 2021-05-23 DIAGNOSIS — Z950 Presence of cardiac pacemaker: Secondary | ICD-10-CM

## 2021-05-23 DIAGNOSIS — F339 Major depressive disorder, recurrent, unspecified: Secondary | ICD-10-CM

## 2021-05-23 DIAGNOSIS — M62830 Muscle spasm of back: Secondary | ICD-10-CM

## 2021-05-23 DIAGNOSIS — E669 Obesity, unspecified: Secondary | ICD-10-CM

## 2021-05-23 LAB — BAYER DCA HB A1C WAIVED: HB A1C (BAYER DCA - WAIVED): 5.6 % (ref 4.8–5.6)

## 2021-05-23 MED ORDER — LORAZEPAM 1 MG PO TABS: ORAL_TABLET | ORAL | 0 refills | Status: DC

## 2021-05-23 MED ORDER — HYDROCODONE-ACETAMINOPHEN 5-325 MG PO TABS
1.0000 | ORAL_TABLET | Freq: Two times a day (BID) | ORAL | 0 refills | Status: AC | PRN
Start: 2021-05-25 — End: 2021-06-24

## 2021-05-23 MED ORDER — HYDROCODONE-ACETAMINOPHEN 5-325 MG PO TABS: 1.0000 | ORAL_TABLET | Freq: Two times a day (BID) | ORAL | 0 refills | Status: AC | PRN

## 2021-05-23 NOTE — Assessment & Plan Note (Signed)
Chronic, stable, followed by cardiology.  Continue current medication regimen as prescribed by them.  Labs: BMP.  Ablation last on 05/17/20. ?

## 2021-05-23 NOTE — Assessment & Plan Note (Signed)
Ongoing stage 3a, recheck BMP today, current up to date, and adjust medications as needed.  His recent labs since July 2022 have been within good ranges and improved. ?

## 2021-05-23 NOTE — Assessment & Plan Note (Signed)
Chronic, ongoing.  Denies SI/HI.  Continue current medication regimen and adjust as needed.  Would benefit from trial off Effexor and trial of SSRI, but refuses this.   

## 2021-05-23 NOTE — Assessment & Plan Note (Signed)
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  Recommended eating smaller high protein, low fat meals more frequently and exercising 30 mins a day 5 times a week with a goal of 10-15lb weight loss in the next 3 months. Patient voiced their understanding and motivation to adhere to these recommendations. ? ?

## 2021-05-23 NOTE — Assessment & Plan Note (Signed)
Chronic, ongoing.  Followed by cardiology.  BP at goal at home and in office.  With orthostatic BP presenting occasionally will continue current regimen at this time and have recommended utilizing compression hose at home, agrees to try this.  Continue collaboration with cardiology + neurology and current medication regimen.  Labs: BMP.  Recommend ensuring good fluid intake at home.   ?

## 2021-05-23 NOTE — Assessment & Plan Note (Signed)
Noted on past imaging, continue statin and BP control for prevention. 

## 2021-05-23 NOTE — Assessment & Plan Note (Signed)
Has been on Ativan for many years, with trial of cutting back unsuccessful.  At length discussions about risk of opioid and benzo use had with patient and wife by both PCP and CCM PharmD.  Continue these discussions.  Refer to anxiety plan. 

## 2021-05-23 NOTE — Assessment & Plan Note (Signed)
Chronic, ongoing.  Continue current medication regimen and adjust as needed.  Lipid panel up to date. 

## 2021-05-23 NOTE — Assessment & Plan Note (Signed)
Ongoing and improved, reports better sleep pattern with this.  Have recommended 100% use and trial cut back on Ativan now that has CPAP. ?

## 2021-05-23 NOTE — Assessment & Plan Note (Signed)
Has had extensive work-up with GI, urology, PCP, and pain management + general surgery.  At this time will continue Tizanidine + collaboration with Dr. Andree Elk with injections appears to radiate from right lower back.  Recommend he get back into GI, Dr. Claybon Jabs who he has seen in past.  Recent CT remains reassuring. ?

## 2021-05-23 NOTE — Assessment & Plan Note (Signed)
Continue collaboration with cardiology + neurology + medication regimen as prescribed by them.  Recommend ensuring good hydration throughout daytime hours + use of compression hose at home (on during daytime and off during night), agrees to try them, although does endorse not wearing. 

## 2021-05-23 NOTE — Assessment & Plan Note (Signed)
Diagnosed on 02/21/21, started on Ozempic and is tolerating well.  A1c much improved today at 5.6%.  To continue current dosing and will see if any other options to assist with cost as does not qualify for assistance.  At this time continue current regimen and adjust as needed.  Glucometer supplies sent past visit, recommend he check sugars at least daily fasting.  Labs today.  Return in 3 months. ?

## 2021-05-23 NOTE — Assessment & Plan Note (Signed)
Followed by cardiology.  Pacemaker checks by them.  ?

## 2021-05-23 NOTE — Assessment & Plan Note (Signed)
Noted on imaging on 11/22/19.  With recommendation to monitor annually.  Will continue statin and monitor BP control.  Last CT abdomen in March 2023 with no aneurysm reported.  Will repeat in April 2024. ?

## 2021-05-23 NOTE — Assessment & Plan Note (Signed)
On Eliquis with A-fib.  Continue to monitor closely for bleeding or increased bruising.  CBC annually. 

## 2021-05-23 NOTE — Assessment & Plan Note (Signed)
Regular checks with cardiology. 

## 2021-05-23 NOTE — Progress Notes (Signed)
Virtual Visit via Telephone Note ? ?I connected with Nicholas [name] on 05/23/21 at  3:30 PM EDT by telephone and verified that I am speaking with the correct person using two identifiers. ? ?Location: ?Patient: Home ?Provider: [facility] control center ?  ?I discussed the limitations, risks, security and privacy concerns of performing an evaluation and management service by telephone and the availability of in person appointments. I also discussed with the patient that there may be a patient responsible charge related to this service. The patient expressed understanding and agreed to proceed. ? ? ?History of Present Illness: ?I spoke with Nicholas [name] today via telephone as we were unable like for the video portion of the conference and he states that he is doing reasonably well with his low back pain.  Unfortunately both he and his wife have an upper respiratory infection and have had some difficulties with that but he feels that he is doing reasonably well with his low back pain hip pain and leg pain.  He is trying to stay active and the warmer weather is helping.  He still taking his medications as prescribed and these continue to work well for him.  No side effects reported.  He still getting some pain into the right anterior thigh but no problems with muscle weakness and has recently had a repeat CT scan that was unrevealing as he reports today per discussion with his primary care physicians.  Otherwise he is in his usual state of health at this time.  No change in lower extremity strength function or bowel or bladder function is noted. ? ?Review of systems: ?General: No fevers or chills ?Pulmonary: No shortness of breath or dyspnea ?Cardiac: No angina or palpitations or lightheadedness ?GI: No abdominal pain or constipation ?Psych: No depression  ?  ?Observations/Objective: ? ?Current Outpatient Medications:  ?  [START ON 05/25/2021] HYDROcodone-acetaminophen (NORCO/VICODIN) 5-325 MG tablet, Take 1 tablet by mouth 2 (two)  times daily as needed for moderate pain or severe pain., Disp: 60 tablet, Rfl: 0 ?  [START ON 06/24/2021] HYDROcodone-acetaminophen (NORCO/VICODIN) 5-325 MG tablet, Take 1 tablet by mouth 2 (two) times daily as needed for moderate pain or severe pain., Disp: 60 tablet, Rfl: 0 ?  acetaminophen (TYLENOL) 650 MG CR tablet, Take 650 mg by mouth every 8 (eight) hours as needed for pain., Disp: , Rfl:  ?  apixaban (ELIQUIS) 5 MG TABS tablet, Take 1 tablet (5 mg total) by mouth 2 (two) times daily., Disp: 180 tablet, Rfl: 1 ?  Ascorbic Acid (VITAMIN C) 1000 MG tablet, Take 1,000 mg by mouth daily., Disp: , Rfl:  ?  Blood Glucose Monitoring Suppl (ONETOUCH VERIO) w/Device KIT, Use to check blood sugar 3 times a day and document results, bring to appointments.  Goal is <130 fasting blood sugar and <180 two hours after meals., Disp: 1 kit, Rfl: 0 ?  Cranberry 500 MG CAPS, Take 500 mg by mouth daily., Disp: , Rfl:  ?  fludrocortisone (FLORINEF) 0.1 MG tablet, Take 100 mcg by mouth daily., Disp: , Rfl:  ?  gabapentin (NEURONTIN) 600 MG tablet, TAKE 1 TABLET BY MOUTH EVERYDAY AT BEDTIME, Disp: 90 tablet, Rfl: 1 ?  glucose blood test strip, Use to check blood sugar 3 times a day and document results, bring to appointments.  Goal is <130 fasting blood sugar and <180 two hours after meals., Disp: 100 each, Rfl: 12 ?  Lancets (ONETOUCH ULTRASOFT) lancets, Use to check blood sugar 3 times a day and document  results, bring to appointments.  Goal is <130 fasting blood sugar and <180 two hours after meals., Disp: 100 each, Rfl: 12 ?  LORazepam (ATIVAN) 1 MG tablet, TAKE 1 TABLET BY MOUTH EVERYDAY AT BEDTIME, Disp: 90 tablet, Rfl: 0 ?  Magnesium 400 MG TABS, Take 400 mg by mouth daily. , Disp: , Rfl:  ?  MELATONIN PO, Take by mouth., Disp: , Rfl:  ?  midodrine (PROAMATINE) 10 MG tablet, Take 1 tablet (10 mg total) by mouth 3 (three) times daily., Disp: 270 tablet, Rfl: 0 ?  Multiple Vitamin (MULTIVITAMIN WITH MINERALS) TABS tablet, Take 1  tablet by mouth daily. Centrum Silver, Disp: , Rfl:  ?  Omega-3 Fatty Acids (FISH OIL) 1200 MG CAPS, Take 1,200 mg by mouth daily., Disp: , Rfl:  ?  oxymetazoline (AFRIN) 0.05 % nasal spray, Place 2 sprays into both nostrils at bedtime., Disp: , Rfl:  ?  OZEMPIC, 0.25 OR 0.5 MG/DOSE, 2 MG/3ML SOPN, Inject 0.5 mg into the skin once a week., Disp: , Rfl:  ?  pantoprazole (PROTONIX) 20 MG tablet, TAKE 1 TABLET BY MOUTH EVERY DAY, Disp: 90 tablet, Rfl: 4 ?  potassium chloride SA (KLOR-CON) 20 MEQ tablet, Take 2 tablets (40 meq) x 1 dose as directed (Patient taking differently: Take 40 mEq by mouth daily as needed (with torsemide (fluid retention)).), Disp: 10 tablet, Rfl: 0 ?  QUEtiapine Fumarate (SEROQUEL XR) 150 MG 24 hr tablet, TAKE 1 TABLET BY MOUTH AT BEDTIME., Disp: 90 tablet, Rfl: 4 ?  rOPINIRole (REQUIP) 0.25 MG tablet, Take 0.25 mg by mouth 4 (four) times daily., Disp: , Rfl:  ?  rosuvastatin (CRESTOR) 40 MG tablet, TAKE 1 TABLET BY MOUTH EVERY DAY, Disp: 90 tablet, Rfl: 1 ?  solifenacin (VESICARE) 10 MG tablet, TAKE 1 TABLET BY MOUTH EVERY DAY, Disp: 90 tablet, Rfl: 1 ?  tiZANidine (ZANAFLEX) 4 MG tablet, Take 1 tablet (4 mg total) by mouth every 6 (six) hours as needed for muscle spasms., Disp: 90 tablet, Rfl: 4 ?  torsemide (DEMADEX) 20 MG tablet, Take 40 mg by mouth daily as needed (fluid retention)., Disp: , Rfl:  ?  venlafaxine XR (EFFEXOR XR) 75 MG 24 hr capsule, Take 3 capsules (225 mg total) by mouth daily., Disp: 270 capsule, Rfl: 4  ? ?Past Medical History:  ?Diagnosis Date  ? Abdominal pain, chronic, right lower quadrant 12/13/2020  ? Anterior urethral stricture   ? Anxiety   ? Arthritis   ? a. knees, hips, hands;  b. 11/2013 s/p L TKA @ [facility].  ? Bile reflux gastritis   ? Bulging lumbar disc   ? BXO (balanitis xerotica obliterans)   ? Complete heart block ([place])   ? a. s/p MDT dual chamber (His bundle) pacemaker 01/2016 Dr Caryl Comes  ? Depression   ? DVT (deep venous thrombosis) ([place])   ? Erosive  esophagitis   ? Gross hematuria   ? Hyperlipemia   ? Hypertension   ? borderline  ? Internal hemorrhoids   ? Phimosis   ? Pulmonary embolism ([place])   ?  ? ?Assessment and Plan: ?1. Lumbar radiculopathy   ?2. Degeneration of lumbar intervertebral disc   ?3. Bilateral sciatica   ?4. Chronic, continuous use of opioids   ?5. Abdominal pain, chronic, right lower quadrant   ?6. Chronic pain syndrome   ?7. Facet syndrome, lumbar   ?8. Osteoarthritis of right hip, unspecified osteoarthritis type   ?9. Spasm of muscle of lower back   ?Based  on our discussion today I think is appropriate to continue with her current medication regimen.  No changes will be made today.  Refills will be initiated for May 3 and June 2.  He seems to be obtaining good relief with this current regimen.  We will defer on any repeat injections as well.  I have talked about some exercises that may help with some of the anterior thigh pain which is consistent with musculoskeletal pain and possibly some psoas strain.  I want him to continue follow-up with his primary care physicians for his ongoing work-up and we will keep him on his current medication regimen to help with his chronic low back pain and leg pain.  He is scheduled for return to clinic in 2 months ? ?Follow Up Instructions: ? ?  ?I discussed the assessment and treatment plan with the patient. The patient was provided an opportunity to ask questions and all were answered. The patient agreed with the plan and demonstrated an understanding of the instructions. ?  ?The patient was advised to call back or seek an in-person evaluation if the symptoms worsen or if the condition fails to improve as anticipated. ? ?I provided 30 minutes of non-face-to-face time during this encounter. ? ? ?[name], MD  ?

## 2021-05-23 NOTE — Progress Notes (Signed)
Contacted via [place] ? ? ?A1c much improved with Ozempic from 6.6% to now 5.6%!!!!

## 2021-05-23 NOTE — Progress Notes (Signed)
? ?BP 136/81   Pulse 87   Temp 98.6 ?F (37 ?C) (Oral)   Ht '5\' 10"'$  (1.778 m)   Wt 282 lb 9.6 oz (128.2 kg)   SpO2 92%   BMI 40.55 kg/m?   ? ?Subjective:  ? ? Patient ID: Nicholas [name], male    DOB: 1946/01/18, 76 y.o.   MRN: [id number] ? ?HPI: ?Nicholas [name] is a 76 y.o. male ? ?Chief Complaint  ?Patient presents with  ? Diabetes  ?  Patient says he was taking the Ozempic medication and then when he went to refill the medication the cost was too expensive and patient says he was denied for the patient assistance program. Patient states he was doing good and felt good with the medication. Patient states he went three weeks without taking the medication. Patient recent Diabetic Eye Exam was requested at today's visit.   ? Hyperlipidemia  ? Hypertension  ?  Patient states he recently had an appt with Dr.Shah. Patient states when he was there they had to take his BP four times and it was running on the lower side. Patient states they changed the his instruction to cutting the BP medication in 1/2.   ? Abdominal Pain  ? Anxiety  ? Pain  ? ?DIABETES ?Was taking Ozempic, but was too costly and could not get patient assistance, but has restarted and paying out of pocket as feeling better this this.  January 6.6%, when Ozempic was initially started. ?Hypoglycemic episodes:no ?Polydipsia/polyuria: no ?Visual disturbance: no ?Chest pain: no ?Paresthesias: no ?Glucose Monitoring: yes ? Accucheck frequency: Daily ? Fasting glucose: 110 range with Ozempic ? Post prandial: ? Evening: ? Before meals: ?Taking Insulin?: no ? Long acting insulin: ? Short acting insulin: ?Blood Pressure Monitoring: daily ?Retinal Examination: Up To Date -- Dr. Ellin Mayhew ?Foot Exam: Up to Date ?Pneumovax: Up to Date ?Influenza: Up to Date ?Aspirin: no  ? ?ABDOMINAL PAIN (RLQ) ?Chronic issue, has had extensive work-up with GI and urology.  Recent CT remains stable. ?  ?Dr. Sabra Heck told him it is not the right hip causing issues. Continues Protonix for  GERD.  His pain provider recommended trying a shot, Dr. Andree Elk.  Pain at times radiates around to back.  He has had two injections, 1st injection helped, but second not as much.  Did not obtain 3rd. ?  ?Reports showed no acute findings and GI recommended Miralax or Linzess -- he does have BM every other day and texture is softer, no straining -- Type 4 on stool chart.   ?Duration:months ?Onset: gradual ?Severity: 5/10 at worst -- if moves to get out of car or in car, rolling in bed make it worse ?Quality: sharp, aching, and throbbing ?Location:  RLQ  ?Episode duration:  ?Radiation: no ?Frequency: intermittent ?Alleviating factors:  ?Aggravating factors: ?Status: worse ?Treatments attempted:  ice, heat, TENS, and opioids, injections ?Fever: no ?Nausea: no ?Vomiting: no ?Weight loss: no ?Decreased appetite: no ?Diarrhea: no ?Constipation: no ?Blood in stool: no ?Heartburn: no ?Jaundice: no ?Rash: no ?Dysuria/urinary frequency: no ?Hematuria: no ?History of sexually transmitted disease: no ?Recurrent NSAID use: no  ? ?HYPERTENSION / HYPERLIPIDEMIA/A-FIB ?Followed by cardiology with last visit 04/05/21.  Had ablation on 05/17/20.  Last echo was in October 2021 noting EF 60-65%., no LVH, and Grade I Diastolic dysfunction.  History of AAA on imaging, but recent in April 2022 improved. ? ?Diagnosed with OSA in June 2022 -- is using CPAP 100% of the time and finds benefit with  sleep pattern and this.  Reports does not qualify for [facility].   ?  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   ?  ?Continues on Rosuvastatin, Eliquis, fish oil. ?Satisfied with current treatment? yes ?Duration of hypertension: chronic ?BP monitoring frequency: not checking ?BP range:  ?BP medication side effects: no ?Duration of hyperlipidemia: chronic ?Cholesterol medication side effects: no ?Cholesterol  supplements: fish oil ?Medication compliance: good compliance ?Aspirin: no ?Recent stressors: no ?Recurrent headaches: no ?Visual changes: no ?Palpitations: no ?Dyspnea: if exerting self a lot ?Chest pain: no ?Lower extremity edema: no ?Dizzy/lightheaded: occasionally, but improved ? ?CHRONIC KIDNEY DISEASE ?Improving on recent labs ?CKD status: stable ?Medications renally dose: yes ?Previous renal evaluation: no ?Pneumovax:  Up to Date ?Influenza Vaccine:  Up to Date ?  ?BPH ?Last saw urology, Dr. Erlene Quan, on 10/05/20, elevation in PSA, but CT scan most recent showed unremarkable prostate.  Taking Vesicare and Tamsulosin. ?BPH status: stable ?Satisfied with current treatment?: yes ?Medication side effects: no ?Medication compliance: good compliance ?Duration: chronic ?Nocturia: 1/night ?Urinary frequency:no ?Incomplete voiding: yes ?Urgency: no ?Weak urinary stream: no ?Straining to start stream: no ?Dysuria: no ?Onset: gradual ?Severity: mild ?Alleviating factors:  ?Aggravating factors:  ?Treatments attempted:  ?IPSS Questionnaire (AUA-7): ?Over the past month?   ?1)  How often have you had a sensation of not emptying your bladder completely after you finish urinating?  0 - Not at all  ?2)  How often have you had to urinate again less than two hours after you finished urinating? 1 - Less than 1 time in 5  ?3)  How often have you found you stopped and started again several times when you urinated?  0 - Not at all  ?4) How difficult have you found it to postpone urination?  0 - Not at all  ?5) How often have you had a weak urinary stream?  0 - Not at all  ?6) How often have you had to push or strain to begin urination?  0 - Not at all  ?7) How many times did you most typically get up to urinate from the time you went to bed until the time you got up in the morning?  1 - 1 time  ?Total score:  0-7 mildly symptomatic  ? 8-19 moderately symptomatic  ? 20-35 severely symptomatic  ?  ? ?DEPRESSION & CHRONIC PAIN ?Taking  Effexor, Seroquel, and Ativan.  Pt and his wife at bedside made aware of risks of benzo medication use to include increased sedation, respiratory suppression, falls, extrapyramidal movements, dependence and cardiovascular events.  Pt and his wife would like to continue treatment as benefit determined to outweigh risk.  Have had at length discussions with him that he is also on opioid therapy.  Discussed risks with taking these three medications together at same time and recommend to separate them when taking Seroquel, Ativan, and opioid. ?  ?He has tried in past taking 1/2 tablet Ativan, but this does not work well (per wife and patient) and wishes to maintain current dosing.  Last Ativan fill on PDMP review 04/21/21 and last opioid fill 04/25/21 with recent UDS with pain management on 10/04/20. Saw Dr. Andree Elk on 04/13/21 last -- returning for injections. Duration:stable ?Anxious mood: yes  ?Excessive worrying: yes ?Irritability: yes ?Sweating: no ?Nausea: no ?Palpitations:no ?Hyperventilation: no ?Panic attacks: no ?Agoraphobia: no  ?Obscessions/compulsions: no ?  Depressed mood: yes ? ?  05/23/2021  ?  9:05 AM 05/02/2021  ?  8:39 AM 02/28/2021  ?  1:07 PM 02/21/2021  ? 11:10 AM 01/12/2021  ? 10:04 AM  ?Depression screen PHQ 2/9  ?Decreased Interest 3 3 0 0 0  ?Down, Depressed, Hopeless 3 3 0 0 0  ?PHQ - 2 Score 6 6 0 0 0  ?Altered sleeping 0 1  1   ?Tired, decreased energy 2 3  0   ?Change in appetite 2 1  0   ?Feeling bad or failure about yourself  2 2  0   ?Trouble concentrating 0 0  0   ?Moving slowly or fidgety/restless 0 0  0   ?Suicidal thoughts 3 0  0   ?PHQ-9 Score '15 13  1   '$ ?Difficult doing work/chores  Very difficult  Not difficult at all   ?  ? ?  05/23/2021  ?  9:05 AM 02/21/2021  ? 11:10 AM 05/12/2020  ?  9:58 AM 02/11/2020  ? 12:55 PM  ?GAD 7 : Generalized Anxiety Score  ?Nervous, Anxious, on Edge 0 0 0 0  ?Control/stop worrying 3 0 1 1  ?Worry too much - different things 3 0 1 1  ?Trouble relaxing 1 0 0 0  ?Restless  0 0 0 0  ?Easily annoyed or irritable 2 0 0 0  ?Afraid - awful might happen 2 0 0 0  ?Total GAD 7 Score 11 0 2 2  ?Anxiety Difficulty Somewhat difficult Not difficult at all Not difficult at all Not difficult a

## 2021-05-23 NOTE — Assessment & Plan Note (Signed)
Chronic, ongoing.  Discussed at length risk of benzo and opioid use in conjunction with each other.  Recommend he not take the two together at same hour during day or evening. Recommend now that has CPAP and improved sleep trial cut back slowly on Ativan.   Continue to collaborate with CCM team on education.  Provided wife and him with copy of [facility] benzo risk information patient sheet in past.  They request 90 day supply, as previous PCP supplied this.  Are aware he does have to return every 3 months for refills.  UDS up to date with pain management.  Refills sent.   ?

## 2021-05-24 LAB — BASIC METABOLIC PANEL
BUN/Creatinine Ratio: 9 — ABNORMAL LOW (ref 10–24)
BUN: 8 mg/dL (ref 8–27)
CO2: 24 mmol/L (ref 20–29)
Calcium: 9.4 mg/dL (ref 8.6–10.2)
Chloride: 104 mmol/L (ref 96–106)
Creatinine, Ser: 0.91 mg/dL (ref 0.76–1.27)
Glucose: 131 mg/dL — ABNORMAL HIGH (ref 70–99)
Potassium: 4 mmol/L (ref 3.5–5.2)
Sodium: 142 mmol/L (ref 134–144)
eGFR: 88 mL/min/{1.73_m2} (ref >59)

## 2021-05-24 NOTE — Progress Notes (Signed)
Contacted via [place] ? ? ?Good evening Nicholas [name], your labs have returned and kidney function, creatinine and eGFR, remains stable!!  [place] news!!  Any questions? ?Keep being amazing!!  Thank you for allowing me to participate in your care.  I appreciate you. ?Kindest regards, ?Shree Espey ?

## 2021-05-30 ENCOUNTER — Encounter: Payer: Self-pay | Admitting: Nurse Practitioner

## 2021-05-30 ENCOUNTER — Ambulatory Visit (INDEPENDENT_AMBULATORY_CARE_PROVIDER_SITE_OTHER): Payer: Medicare Other | Admitting: Nurse Practitioner

## 2021-05-30 DIAGNOSIS — R051 Acute cough: Secondary | ICD-10-CM | POA: Diagnosis not present

## 2021-05-30 MED ORDER — AMOXICILLIN-POT CLAVULANATE 875-125 MG PO TABS: 1.0000 | ORAL_TABLET | Freq: Two times a day (BID) | ORAL | 0 refills | Status: AC

## 2021-05-30 MED ORDER — PREDNISONE 20 MG PO TABS: 40.0000 mg | ORAL_TABLET | Freq: Every day | ORAL | 0 refills | Status: AC

## 2021-05-30 NOTE — Progress Notes (Signed)
? ?BP 124/80   Pulse (!) 101   Temp 98.3 ?F (36.8 ?C) (Oral)   Ht '5\' 10"'  (1.778 m)   Wt 280 lb 12.8 oz (127.4 kg)   SpO2 92%   BMI 40.29 kg/m?   ? ?Subjective:  ? ? Patient ID: Nicholas [name], male    DOB: Jun 08, 1945, 76 y.o.   MRN: [id number] ? ?HPI: ?Nicholas [name] is a 76 y.o. male ? ?Chief Complaint  ?Patient presents with  ? Cough  ?  Patient states he was symptomatic when he came in for his office visit on 05/23/21. Patient states he is still having coughing, congestion, and runny nose. Patient states he has had some episodes of dizziness. Patient has tried Mucinex and Coricidin and neither of them has helped the patient. Patient states they have been in the yard doing a lot of yard work lately.   ? Nasal Congestion  ? Headache  ? Dizziness  ? ?UPPER RESPIRATORY TRACT INFECTION ?Has had symptoms since 05/23/21.  Started out with cough. Wife was sick first and he obtained from her.  She was Covid negative.   ?Worst symptom: cough and congestion ?Fever: no ?Cough: yes ?Shortness of breath: no ?Wheezing: yes ?Chest pain: no ?Chest tightness: no ?Chest congestion: no ?Nasal congestion: yes ?Runny nose: yes ?Post nasal drip: yes ?Sneezing: no ?Sore throat: no ?Swollen glands: no ?Sinus pressure: yes ?Headache: yes ?Face pain: no ?Toothache: no ?Ear pain: none ?Ear pressure: yes bilateral ?Eyes red/itching:no ?Eye drainage/crusting: no  ?Vomiting: no ?Rash: no ?Fatigue: yes ?Sick contacts: yes ?Strep contacts: no  ?Context: fluctuating ?Recurrent sinusitis: no ?Relief with OTC cold/cough medications: no  ?Treatments attempted: Coricidin and Mucinex   ? ?Relevant past medical, surgical, family and social history reviewed and updated as indicated. Interim medical history since our last visit reviewed. ?Allergies and medications reviewed and updated. ? ?Review of Systems  ?Constitutional:  Positive for fatigue. Negative for activity change, appetite change, chills and fever.  ?HENT:  Positive for congestion, ear pain,  postnasal drip, rhinorrhea and sinus pressure. Negative for ear discharge, sinus pain, sore throat and voice change.   ?Respiratory:  Positive for cough and wheezing. Negative for chest tightness and shortness of breath.   ?Cardiovascular:  Negative for chest pain, palpitations and leg swelling.  ?Neurological:  Positive for dizziness and headaches.  ?Psychiatric/Behavioral:  Negative for decreased concentration, self-injury, sleep disturbance and suicidal ideas. The patient is not nervous/anxious.   ? ?Per HPI unless specifically indicated above ? ?   ?Objective:  ?  ?BP 124/80   Pulse (!) 101   Temp 98.3 ?F (36.8 ?C) (Oral)   Ht '5\' 10"'  (1.778 m)   Wt 280 lb 12.8 oz (127.4 kg)   SpO2 92%   BMI 40.29 kg/m?   ?Wt Readings from Last 3 Encounters:  ?05/30/21 280 lb 12.8 oz (127.4 kg)  ?05/23/21 282 lb 9.6 oz (128.2 kg)  ?04/05/21 279 lb (126.6 kg)  ?  ?Physical Exam ?Vitals and nursing note reviewed.  ?Constitutional:   ?   General: He is awake. He is not in acute distress. ?   Appearance: He is well-developed and well-groomed. He is obese.  ?HENT:  ?   Head: Normocephalic and atraumatic.  ?   Right Ear: Hearing normal. No drainage.  ?   Left Ear: Hearing normal. No drainage.  ?   Nose: Rhinorrhea present. Rhinorrhea is clear.  ?   Right Sinus: No maxillary sinus tenderness or frontal sinus tenderness.  ?  Left Sinus: No maxillary sinus tenderness or frontal sinus tenderness.  ?   Mouth/Throat:  ?   Mouth: Mucous membranes are moist.  ?   Pharynx: Posterior oropharyngeal erythema (with mild cobblestone appearance) present. No pharyngeal swelling or oropharyngeal exudate.  ?Eyes:  ?   General: Lids are normal.     ?   Right eye: No discharge.     ?   Left eye: No discharge.  ?   Conjunctiva/sclera: Conjunctivae normal.  ?   Pupils: Pupils are equal, round, and reactive to light.  ?Neck:  ?   Thyroid: No thyromegaly.  ?   Vascular: No carotid bruit.  ?Cardiovascular:  ?   Rate and Rhythm: Normal rate and regular  rhythm.  ?   Heart sounds: Normal heart sounds, S1 normal and S2 normal. No murmur heard. ?  No gallop.  ?Pulmonary:  ?   Effort: Pulmonary effort is normal. No accessory muscle usage or respiratory distress.  ?   Breath sounds: Normal breath sounds.  ?Abdominal:  ?   General: Bowel sounds are normal. There is no distension.  ?   Palpations: Abdomen is soft.  ?   Tenderness: There is no abdominal tenderness.  ?Musculoskeletal:     ?   General: Normal range of motion.  ?   Cervical back: Normal range of motion and neck supple.  ?   Right lower leg: No edema.  ?   Left lower leg: No edema.  ?Skin: ?   General: Skin is warm and dry.  ?Neurological:  ?   Mental Status: He is alert and oriented to person, place, and time.  ?Psychiatric:     ?   Attention and Perception: Attention normal.     ?   Mood and Affect: Mood normal.     ?   Behavior: Behavior normal. Behavior is cooperative.     ?   Thought Content: Thought content normal.  ? ? ?Results for orders placed or performed in visit on 05/23/21  ?Bayer DCA Hb A1c Waived  ?Result Value Ref Range  ? HB A1C (BAYER DCA - WAIVED) 5.6 4.8 - 5.6 %  ?Basic metabolic panel  ?Result Value Ref Range  ? Glucose 131 (H) 70 - 99 mg/dL  ? BUN 8 8 - 27 mg/dL  ? Creatinine, Ser 0.91 0.76 - 1.27 mg/dL  ? eGFR 88 >59 mL/min/1.73  ? BUN/Creatinine Ratio 9 (L) 10 - 24  ? Sodium 142 134 - 144 mmol/L  ? Potassium 4.0 3.5 - 5.2 mmol/L  ? Chloride 104 96 - 106 mmol/L  ? CO2 24 20 - 29 mmol/L  ? Calcium 9.4 8.6 - 10.2 mg/dL  ? ?   ?Assessment & Plan:  ? ?Problem List Items Addressed This Visit   ? ?  ? Other  ? Cough  ?  Acute and ongoing for 7 days, initially presented with his wife who tested negative for Covid.  At this time will start Augmentin BID for 7 days and Prednisone 40 MG daily for 5 days.  Recommend he self quarantine until symptoms have improved and recommend: ?- Increasing Fluids ?- Acetaminophen as needed for fever/pain.  ?- Salt water gargling, chloraseptic spray and throat  lozenges ?- Mucinex and Corcidin as needed ?- Humidifying the air. ?Return to office if worsening or ongoing. ?  ?  ?  ? ?Follow up plan: ?Return if symptoms worsen or fail to improve. ? ? ? ? ? ?

## 2021-05-30 NOTE — Assessment & Plan Note (Signed)
Acute and ongoing for 7 days, initially presented with his wife who tested negative for Covid.  At this time will start Augmentin BID for 7 days and Prednisone 40 MG daily for 5 days.  Recommend he self quarantine until symptoms have improved and recommend: ?- Increasing Fluids ?- Acetaminophen as needed for fever/pain.  ?- Salt water gargling, chloraseptic spray and throat lozenges ?- Mucinex and Corcidin as needed ?- Humidifying the air. ?Return to office if worsening or ongoing. ?

## 2021-05-30 NOTE — Patient Instructions (Signed)

## 2021-06-13 ENCOUNTER — Ambulatory Visit (INDEPENDENT_AMBULATORY_CARE_PROVIDER_SITE_OTHER): Payer: Medicare Other

## 2021-06-13 DIAGNOSIS — I442 Atrioventricular block, complete: Secondary | ICD-10-CM | POA: Diagnosis not present

## 2021-06-14 LAB — CUP PACEART REMOTE DEVICE CHECK
Battery Remaining Longevity: 30 mo
Battery Voltage: 2.95 V
Brady Statistic AP VP Percent: 19.61 %
Brady Statistic AP VS Percent: 0 %
Brady Statistic AS VP Percent: 80.36 %
Brady Statistic AS VS Percent: 0.03 %
Brady Statistic RA Percent Paced: 19.54 %
Brady Statistic RV Percent Paced: 99.96 %
Date Time Interrogation Session: 20230522094928
Implantable Lead Implant Date: 20180124
Implantable Lead Implant Date: 20180124
Implantable Lead Location: 753859
Implantable Lead Location: 753860
Implantable Lead Model: 3830
Implantable Lead Model: 5076
Implantable Pulse Generator Implant Date: 20180124
Lead Channel Impedance Value: 323 Ohm
Lead Channel Impedance Value: 342 Ohm
Lead Channel Impedance Value: 361 Ohm
Lead Channel Impedance Value: 494 Ohm
Lead Channel Pacing Threshold Amplitude: 0.5 V
Lead Channel Pacing Threshold Amplitude: 0.75 V
Lead Channel Pacing Threshold Pulse Width: 0.4 ms
Lead Channel Pacing Threshold Pulse Width: 0.4 ms
Lead Channel Sensing Intrinsic Amplitude: 2.125 mV
Lead Channel Sensing Intrinsic Amplitude: 2.125 mV
Lead Channel Sensing Intrinsic Amplitude: 3.25 mV
Lead Channel Sensing Intrinsic Amplitude: 3.25 mV
Lead Channel Setting Pacing Amplitude: 2 V
Lead Channel Setting Pacing Amplitude: 2.25 V
Lead Channel Setting Pacing Pulse Width: 1 ms
Lead Channel Setting Sensing Sensitivity: 0.6 mV

## 2021-06-28 ENCOUNTER — Telehealth: Payer: Self-pay

## 2021-06-28 NOTE — Chronic Care Management (AMB) (Signed)
Chronic Care Management Pharmacy Assistant   Name: Nicholas [name]  MRN: [id number] DOB: Feb 02, 1945  Reason for Encounter: Disease State-General    Recent office visits:  05/30/21 [name] T, NP (Acute cough) No orders; Medication changes: amoxicillin-clavulanate (AUGMENTIN) 875-125 MG tablet, predniSONE (DELTASONE) 20 MG tablet  05/23/21 Cannady, [name], NP (Diabetes, Hyperlipidemia, Hypertension, Abdominal pain, Anxiety) Orders: BMP, Bayer DCA Hb A1c; Medication changes: OZEMPIC, 0.25 OR 0.5 MG/DOSE, 2 MG/3ML SOPN, Dulaglutide (TRULICITY) 8.76 OT/1.5BW SOPN(Patient not taking)   Recent consult visits:  05/23/21 [name], MD-Anestesiology (Lumbar radiculopathy) No orders; Medication changes: HYDROcodone-acetaminophen (NORCO/VICODIN) 5-325 MG tablet  Hospital visits:  None since last coordination call  Medications: Outpatient Encounter Medications as of 06/28/2021  Medication Sig   acetaminophen (TYLENOL) 650 MG CR tablet Take 650 mg by mouth every 8 (eight) hours as needed for pain.   apixaban (ELIQUIS) 5 MG TABS tablet Take 1 tablet (5 mg total) by mouth 2 (two) times daily.   Ascorbic Acid (VITAMIN C) 1000 MG tablet Take 1,000 mg by mouth daily.   Blood Glucose Monitoring Suppl (ONETOUCH VERIO) w/Device KIT Use to check blood sugar 3 times a day and document results, bring to appointments.  Goal is <130 fasting blood sugar and <180 two hours after meals.   Cranberry 500 MG CAPS Take 500 mg by mouth daily.   fludrocortisone (FLORINEF) 0.1 MG tablet Take 100 mcg by mouth daily.   gabapentin (NEURONTIN) 600 MG tablet TAKE 1 TABLET BY MOUTH EVERYDAY AT BEDTIME   glucose blood test strip Use to check blood sugar 3 times a day and document results, bring to appointments.  Goal is <130 fasting blood sugar and <180 two hours after meals.   HYDROcodone-acetaminophen (NORCO/VICODIN) 5-325 MG tablet Take 1 tablet by mouth 2 (two) times daily as needed for moderate pain or severe pain.    Lancets (ONETOUCH ULTRASOFT) lancets Use to check blood sugar 3 times a day and document results, bring to appointments.  Goal is <130 fasting blood sugar and <180 two hours after meals.   LORazepam (ATIVAN) 1 MG tablet TAKE 1 TABLET BY MOUTH EVERYDAY AT BEDTIME   Magnesium 400 MG TABS Take 400 mg by mouth daily.    MELATONIN PO Take by mouth.   midodrine (PROAMATINE) 10 MG tablet Take 1 tablet (10 mg total) by mouth 3 (three) times daily.   Multiple Vitamin (MULTIVITAMIN WITH MINERALS) TABS tablet Take 1 tablet by mouth daily. Centrum Silver   Omega-3 Fatty Acids (FISH OIL) 1200 MG CAPS Take 1,200 mg by mouth daily.   oxymetazoline (AFRIN) 0.05 % nasal spray Place 2 sprays into both nostrils at bedtime.   OZEMPIC, 0.25 OR 0.5 MG/DOSE, 2 MG/3ML SOPN Inject 0.5 mg into the skin once a week.   pantoprazole (PROTONIX) 20 MG tablet TAKE 1 TABLET BY MOUTH EVERY DAY   potassium chloride SA (KLOR-CON) 20 MEQ tablet Take 2 tablets (40 meq) x 1 dose as directed (Patient taking differently: Take 40 mEq by mouth daily as needed (with torsemide (fluid retention)).)   QUEtiapine Fumarate (SEROQUEL XR) 150 MG 24 hr tablet TAKE 1 TABLET BY MOUTH AT BEDTIME.   rOPINIRole (REQUIP) 0.25 MG tablet Take 0.25 mg by mouth 4 (four) times daily.   rosuvastatin (CRESTOR) 40 MG tablet TAKE 1 TABLET BY MOUTH EVERY DAY   solifenacin (VESICARE) 10 MG tablet TAKE 1 TABLET BY MOUTH EVERY DAY   tiZANidine (ZANAFLEX) 4 MG tablet Take 1 tablet (4 mg  total) by mouth every 6 (six) hours as needed for muscle spasms.   torsemide (DEMADEX) 20 MG tablet Take 40 mg by mouth daily as needed (fluid retention).   venlafaxine XR (EFFEXOR XR) 75 MG 24 hr capsule Take 3 capsules (225 mg total) by mouth daily.   No facility-administered encounter medications on file as of 06/28/2021.   [place] for General Review Call   Chart Review:  Have there been any documented new, changed, or discontinued medications since last  visit? Yes (If yes, include name, dose, frequency, date)05/30/21 Cannady, Jolene T, NP Medication changes: amoxicillin-clavulanate (AUGMENTIN) 875-125 MG tablet, predniSONE (DELTASONE) 20 MG tablet, 05/23/21 Cannady, Jolene T, NP Medication changes: OZEMPIC, 0.25 OR 0.5 MG/DOSE, 2 MG/3ML SOPN, Dulaglutide (TRULICITY) 7.07 EM/7.5QG SOPN(Patient not taking)  05/23/21 [name], MD-Anestesiology Medication changes: HYDROcodone-acetaminophen (NORCO/VICODIN) 5-325 MG tablet Has there been any documented recent hospitalizations or ED visits since last visit with Clinical Pharmacist? No Brief Summary (including medication and/or Diagnosis changes):   Adherence Review:  Does the Clinical Pharmacist Assistant have access to adherence rates? Yes Adherence rates for STAR metric medications (List medication(s)/day supply/ last 2 fill dates). Adherence rates for medications indicated for disease state being reviewed (List medication(s)/day supply/ last 2 fill dates). Does the patient have >5 day gap between last estimated fill dates for any of the above medications or other medication gaps? No Reason for medication gaps.   Disease State Questions:  Able to connect with Patient? Yes  Did patient have any problems with their health recently? No Note problems and Concerns:  Have you had any admissions or emergency room visits or worsening of your condition(s) since last visit? No Details of ED visit, hospital visit and/or worsening condition(s):  Have you had any visits with new specialists or providers since your last visit? No Explain:  Have you had any new health care problem(s) since your last visit? No New problem(s) reported:  Have you run out of any of your medications since you last spoke with clinical pharmacist? No What caused you to run out of your medications?  Are there any medications you are not taking as prescribed? Yes What kept you from taking your medications as  prescribed?Patient wife states that pt seen urologist about a month ago and the patients blood pressure was elevated so they told the patient to cut his midodrine in half. When they got home they forgot to tell the urologist that he was taking 10 mg, so the cut it in fourths but patient started to feel dizzy. So they just went back to taking the 5 mg and he has been doing find  Are you having any issues or side effects with your medications? Yes Note of issues or side effects:Patient states that since he has been on the Ozempic he has had diarrhea  Do you have any other health concerns or questions you want to discuss with your Clinical Pharmacist before your next visit? No Note additional concerns and questions from Patient.  Are there any health concerns that you feel we can do a better job addressing? No Note Patient's response.  Are you having any problems with any of the following since the last visit: (select all that apply)  None  Details:  12. Any falls since last visit? No  Details:  13. Any increased or uncontrolled pain since last visit? Yes  Details:Patient states that he has pain in his joints, but he has since joined [employer] and it  seems to be helping.  14. Next visit Type: telephone       Visit with:Clinical Pharmacist         Date:10/24/21        Time:9 am  15. Additional Details? No    Care Gaps: Colonoscopy-11/26/20 Diabetic Foot Exam-03/23/21 Ophthalmology-05/12/21 Dexa Scan - NA Annual Well Visit - NA Micro albumin-03/23/21 Hemoglobin A1c- 05/23/21  Star Rating Drugs: Ozempic 0.25-0.5 mg/dose pen-last fill 06/07/21 28 ds Rosuvastatin 40 mg-last fill 05/21/21 90 ds  Ethelene Hal Clinical Pharmacist Assistant [phone]

## 2021-06-29 NOTE — Progress Notes (Signed)
Remote pacemaker transmission.   

## 2021-07-01 ENCOUNTER — Other Ambulatory Visit: Payer: Self-pay | Admitting: Nurse Practitioner

## 2021-07-01 DIAGNOSIS — I10 Essential (primary) hypertension: Secondary | ICD-10-CM

## 2021-07-01 NOTE — Telephone Encounter (Signed)
Requested Prescriptions  Pending Prescriptions Disp Refills  . solifenacin (VESICARE) 10 MG tablet [Pharmacy Med Name: SOLIFENACIN 10 MG TABLET] 90 tablet 0    Sig: TAKE 1 TABLET BY MOUTH EVERY DAY     Urology:  Bladder Agents 2 Passed - 07/01/2021  9:07 AM      Passed - Cr in normal range and within 360 days    Creatinine  Date Value Ref Range Status  02/26/2014 0.94 0.60 - 1.30 mg/dL Final   Creatinine, Ser  Date Value Ref Range Status  05/23/2021 0.91 0.76 - 1.27 mg/dL Final         Passed - ALT in normal range and within 360 days    ALT  Date Value Ref Range Status  03/23/2021 28 0 - 44 IU/L Final   SGPT (ALT)  Date Value Ref Range Status  04/07/2013 22 12 - 78 U/L Final   ALT (SGPT) Piccolo, Waived  Date Value Ref Range Status  01/03/2018 24 10 - 47 U/L Final         Passed - AST in normal range and within 360 days    AST  Date Value Ref Range Status  03/23/2021 27 0 - 40 IU/L Final   SGOT(AST)  Date Value Ref Range Status  04/07/2013 28 15 - 37 Unit/L Final   AST (SGOT) Piccolo, Waived  Date Value Ref Range Status  01/03/2018 34 11 - 38 U/L Final         Passed - Valid encounter within last 12 months    Recent Outpatient Visits          1 month ago Acute cough   [place], [place] T, NP   1 month ago Type 2 diabetes mellitus with obesity ([place])   [place], Jolene T, NP   3 months ago Abdominal pain, chronic, right lower quadrant   [place], Jolene T, NP   4 months ago Type 2 diabetes mellitus with obesity ([place])   [place], Jolene T, NP   6 months ago Abdominal pain, chronic, right lower quadrant   [place], Barbaraann Faster, NP      Future Appointments            In 1 month Cannady, Barbaraann Faster, NP [facility]           . rosuvastatin (CRESTOR) 40 MG tablet [Pharmacy Med Name: ROSUVASTATIN CALCIUM 40 MG TAB] 90 tablet 0     Sig: TAKE 1 TABLET BY MOUTH EVERY DAY     Cardiovascular:  Antilipid - Statins 2 Failed - 07/01/2021  9:07 AM      Failed - Lipid Panel in normal range within the last 12 months    Cholesterol, Total  Date Value Ref Range Status  02/21/2021 154 100 - 199 mg/dL Final   Cholesterol Piccolo, Waived  Date Value Ref Range Status  01/03/2018 198 <200 mg/dL Final    Comment:                            Desirable                <200                         Borderline High      200- 239  High                     >239    LDL Chol Calc (NIH)  Date Value Ref Range Status  02/21/2021 67 0 - 99 mg/dL Final   HDL  Date Value Ref Range Status  02/21/2021 65 >39 mg/dL Final   Triglycerides  Date Value Ref Range Status  02/21/2021 127 0 - 149 mg/dL Final   Triglycerides Piccolo,Waived  Date Value Ref Range Status  01/03/2018 264 (H) <150 mg/dL Final    Comment:                            Normal                   <150                         Borderline High     150 - 199                         High                200 - 499                         Very High                >499          Passed - Cr in normal range and within 360 days    Creatinine  Date Value Ref Range Status  02/26/2014 0.94 0.60 - 1.30 mg/dL Final   Creatinine, Ser  Date Value Ref Range Status  05/23/2021 0.91 0.76 - 1.27 mg/dL Final         Passed - Patient is not pregnant      Passed - Valid encounter within last 12 months    Recent Outpatient Visits          1 month ago Acute cough   [place], [place] T, NP   1 month ago Type 2 diabetes mellitus with obesity ([place])   [place], Jolene T, NP   3 months ago Abdominal pain, chronic, right lower quadrant   [place], [place] T, NP   4 months ago Type 2 diabetes mellitus with obesity ([place])   [place], Jolene T, NP   6 months ago Abdominal pain,  chronic, right lower quadrant   [place], Barbaraann Faster, NP      Future Appointments            In 1 month Cannady, Barbaraann Faster, NP [facility]

## 2021-07-11 ENCOUNTER — Telehealth: Payer: Self-pay | Admitting: Anesthesiology

## 2021-07-11 NOTE — Telephone Encounter (Signed)
Sorry, meant to send to [facility].

## 2021-07-11 NOTE — Telephone Encounter (Signed)
Patient would like to come in for injections, can you go ahead and start authorization. He has [employer] MCR. Dr. Andree Elk patient

## 2021-07-21 ENCOUNTER — Other Ambulatory Visit: Payer: Self-pay | Admitting: Nurse Practitioner

## 2021-07-21 DIAGNOSIS — F419 Anxiety disorder, unspecified: Secondary | ICD-10-CM

## 2021-07-21 NOTE — Telephone Encounter (Signed)
Requested medication (s) are due for refill today:   Provider to review Ativan,  gabapentin Yes  Requested medication (s) are on the active medication list:   Yes for both  Future visit scheduled:   Yes in 1 month   Last ordered: Ativan 05/23/2021 #90, 0 refills;   gabapentin 01/19/2021 #90, 1 refill   Returned because non delegated refill  Requested Prescriptions  Pending Prescriptions Disp Refills   LORazepam (ATIVAN) 1 MG tablet [Pharmacy Med Name: LORAZEPAM 1 MG TABLET] 90 tablet 0    Sig: TAKE 1 TABLET BY MOUTH EVERYDAY AT BEDTIME     Not Delegated - Psychiatry: Anxiolytics/Hypnotics 2 Failed - 07/21/2021 10:49 AM      Failed - This refill cannot be delegated      Failed - Urine Drug Screen completed in last 360 days      Passed - Patient is not pregnant      Passed - Valid encounter within last 6 months    Recent Outpatient Visits           1 month ago Acute cough   [place], [place] T, NP   1 month ago Type 2 diabetes mellitus with obesity ([place])   [place], Jolene T, NP   4 months ago Abdominal pain, chronic, right lower quadrant   [place], Jolene T, NP   5 months ago Type 2 diabetes mellitus with obesity ([place])   [place], Jolene T, NP   7 months ago Abdominal pain, chronic, right lower quadrant   [place], [place] T, NP       Future Appointments             In 1 month Cannady, [place] T, NP [facility]             gabapentin (NEURONTIN) 600 MG tablet [Pharmacy Med Name: GABAPENTIN 600 MG TABLET] 90 tablet 1    Sig: TAKE 1 TABLET BY MOUTH EVERYDAY AT BEDTIME     Neurology: Anticonvulsants - gabapentin Passed - 07/21/2021 10:49 AM      Passed - Cr in normal range and within 360 days    Creatinine  Date Value Ref Range Status  02/26/2014 0.94 0.60 - 1.30 mg/dL Final   Creatinine, Ser  Date Value Ref Range Status   05/23/2021 0.91 0.76 - 1.27 mg/dL Final         Passed - Completed PHQ-2 or PHQ-9 in the last 360 days      Passed - Valid encounter within last 12 months    Recent Outpatient Visits           1 month ago Acute cough   [place], [place] T, NP   1 month ago Type 2 diabetes mellitus with obesity ([place])   [place], Jolene T, NP   4 months ago Abdominal pain, chronic, right lower quadrant   [place], Jolene T, NP   5 months ago Type 2 diabetes mellitus with obesity ([place])   [place] Cannady, Jolene T, NP   7 months ago Abdominal pain, chronic, right lower quadrant   [place], Barbaraann Faster, NP       Future Appointments             In 1 month Cannady, Barbaraann Faster, NP [facility]

## 2021-07-25 ENCOUNTER — Telehealth: Payer: Self-pay | Admitting: Nurse Practitioner

## 2021-07-25 DIAGNOSIS — F419 Anxiety disorder, unspecified: Secondary | ICD-10-CM

## 2021-07-25 MED ORDER — LORAZEPAM 1 MG PO TABS: ORAL_TABLET | ORAL | 0 refills | Status: DC

## 2021-07-25 NOTE — Telephone Encounter (Signed)
Copied from [place] [id number]. Topic: General - Other >> Jul 25, 2021  2:28 PM Oley Balm E wrote: Reason for CRM: Pt called reporting that he needs his PCP to notify his insurance that he needs more Lorazepam. Says if she has any questions to please call back  Best contact:  [phone]

## 2021-07-27 ENCOUNTER — Other Ambulatory Visit: Payer: Self-pay | Admitting: Internal Medicine

## 2021-07-27 NOTE — Telephone Encounter (Signed)
Spoke with [facility] in to follow up with patient's prescription for Lorazepam. Pharmacist says he is not sure why the patient was only prescribed 6 tablets. Pharmacist said they have a refill on file for patient and it is ready for pick up. Pharmacist says they have been trying to reach out to the patient. Patient was called and made aware and verbalized understanding.

## 2021-07-28 ENCOUNTER — Ambulatory Visit (HOSPITAL_BASED_OUTPATIENT_CLINIC_OR_DEPARTMENT_OTHER): Payer: Medicare Other | Admitting: Anesthesiology

## 2021-07-28 ENCOUNTER — Ambulatory Visit
Admission: RE | Admit: 2021-07-28 | Discharge: 2021-07-28 | Disposition: A | Discharge status: Home / Self Care | Payer: Medicare Other | Source: Ambulatory Visit | Attending: Anesthesiology | Admitting: Anesthesiology

## 2021-07-28 ENCOUNTER — Other Ambulatory Visit: Payer: Self-pay

## 2021-07-28 ENCOUNTER — Other Ambulatory Visit: Payer: Self-pay | Admitting: Anesthesiology

## 2021-07-28 ENCOUNTER — Encounter: Payer: Self-pay | Admitting: Anesthesiology

## 2021-07-28 VITALS — BP 173/96 | HR 93 | Temp 97.2°F | Resp 19 | Ht 71.0 in | Wt 272.0 lb

## 2021-07-28 DIAGNOSIS — M5416 Radiculopathy, lumbar region: Secondary | ICD-10-CM

## 2021-07-28 DIAGNOSIS — M5431 Sciatica, right side: Secondary | ICD-10-CM | POA: Diagnosis not present

## 2021-07-28 DIAGNOSIS — R1031 Right lower quadrant pain: Secondary | ICD-10-CM

## 2021-07-28 DIAGNOSIS — M5432 Sciatica, left side: Secondary | ICD-10-CM | POA: Diagnosis present

## 2021-07-28 DIAGNOSIS — M79642 Pain in left hand: Secondary | ICD-10-CM | POA: Diagnosis present

## 2021-07-28 DIAGNOSIS — M79641 Pain in right hand: Secondary | ICD-10-CM | POA: Diagnosis present

## 2021-07-28 DIAGNOSIS — M51369 Other intervertebral disc degeneration, lumbar region without mention of lumbar back pain or lower extremity pain: Secondary | ICD-10-CM

## 2021-07-28 DIAGNOSIS — G894 Chronic pain syndrome: Secondary | ICD-10-CM | POA: Insufficient documentation

## 2021-07-28 DIAGNOSIS — R52 Pain, unspecified: Secondary | ICD-10-CM

## 2021-07-28 DIAGNOSIS — M47816 Spondylosis without myelopathy or radiculopathy, lumbar region: Secondary | ICD-10-CM | POA: Insufficient documentation

## 2021-07-28 DIAGNOSIS — G8929 Other chronic pain: Secondary | ICD-10-CM

## 2021-07-28 DIAGNOSIS — M1611 Unilateral primary osteoarthritis, right hip: Secondary | ICD-10-CM | POA: Insufficient documentation

## 2021-07-28 DIAGNOSIS — F1190 Opioid use, unspecified, uncomplicated: Secondary | ICD-10-CM | POA: Insufficient documentation

## 2021-07-28 DIAGNOSIS — M5136 Other intervertebral disc degeneration, lumbar region: Secondary | ICD-10-CM | POA: Diagnosis not present

## 2021-07-28 MED ORDER — TRIAMCINOLONE ACETONIDE 40 MG/ML IJ SUSP
40.0000 mg | Freq: Once | INTRAMUSCULAR | Status: AC
  Administered 2021-07-28: 40 mg

## 2021-07-28 MED ORDER — SODIUM CHLORIDE 0.9% FLUSH
10.0000 mL | Freq: Once | INTRAVENOUS | Status: AC
  Administered 2021-07-28: 10 mL

## 2021-07-28 MED ORDER — ROPIVACAINE HCL 2 MG/ML IJ SOLN
10.0000 mL | Freq: Once | INTRAMUSCULAR | Status: AC
  Administered 2021-07-28: 1 mL via EPIDURAL

## 2021-07-28 MED ORDER — HYDROCODONE-ACETAMINOPHEN 5-325 MG PO TABS: 1.0000 | ORAL_TABLET | Freq: Two times a day (BID) | ORAL | 0 refills | Status: AC | PRN

## 2021-07-28 MED ORDER — MIDAZOLAM HCL 2 MG/2ML IJ SOLN: 2.0000 mg | Freq: Once | INTRAMUSCULAR | Status: DC

## 2021-07-28 MED ORDER — LIDOCAINE HCL (PF) 1 % IJ SOLN
5.0000 mL | Freq: Once | INTRAMUSCULAR | Status: AC
  Administered 2021-07-28: 5 mL via SUBCUTANEOUS

## 2021-07-28 MED ORDER — IOPAMIDOL (ISOVUE-M 200) INJECTION 41%: 20.0000 mL | Freq: Once | INTRAMUSCULAR | Status: DC | PRN

## 2021-07-28 MED ORDER — LACTATED RINGERS IV SOLN: INTRAVENOUS | Status: DC

## 2021-07-28 NOTE — Progress Notes (Signed)
Nursing Pain Medication Assessment:  Safety precautions to be maintained throughout the outpatient stay will include: orient to surroundings, keep bed in low position, maintain call bell within reach at all times, provide assistance with transfer out of bed and ambulation.  Medication Inspection Compliance: Pill count conducted under aseptic conditions, in front of the patient. Neither the pills nor the bottle was removed from the patient's sight at any time. Once count was completed pills were immediately returned to the patient in their original bottle.  Medication: Hydrocodone/APAP Pill/Patch Count:  11 of 60 pills remain Pill/Patch Appearance: Markings consistent with prescribed medication Bottle Appearance: Standard pharmacy container. Clearly labeled. Filled Date: 06 / 08 / 2023 Last Medication intake:  Today

## 2021-07-28 NOTE — Progress Notes (Signed)
A few elevated BP's noted.  Diastolic successfully under 100 when arm elevated to level of heart.  Upon admission when touching patient's arm he felt clammy.  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  Procedure went well.  Last BP reading was elevated. Assisted patient to sitting position and retook BP.  Diastolic down to below 814.  Patient denies feeling dizzy or having visual disturbance.  Remains clammy feeling, but again states he is fine and desires to be d/c.  Spoke with wife and let her know my findings.  States he has not eaten since 0900 this a.m.  English muffin.  Crackers and peanut butter offered.  Patient refused and is just ready to go.  Patient d/c's ambulatory with wife.

## 2021-07-28 NOTE — Patient Instructions (Signed)

## 2021-07-29 ENCOUNTER — Telehealth: Payer: Self-pay | Admitting: *Deleted

## 2021-07-29 NOTE — Telephone Encounter (Signed)
Post procedure call:   no  questions or concerns.  

## 2021-07-29 NOTE — Progress Notes (Signed)
Subjective:  Patient ID: Nicholas [name], male    DOB: 1945/05/17  Age: 76 y.o. MRN: [id number]  CC: Back Pain (LUMBAR MIDLINE)   Procedure: L5-S1 epidural steroid under fluoroscopic guidance with no sedation  HPI Nicholas [name] presents for reevaluation.  Nicholas [name] continues to have low back pain with radiation into the right posterior lateral leg.  He gets this periodically throughout the year and it remains quite recalcitrant and difficult to treat with standard conservative care including physical therapy stretching strengthening or medication management alone.  He does respond favorably to epidural steroid injections.  He reports approximately 75 to 80% relief in his low back pain and nearly 100% relief in his right lower extremity pain with epidural management lasting about 2 months before he gets a gradual slow recurrence of his baseline pain.  Otherwise the quality characteristic and distribution of pain that he does experience is stable in nature with no recent changes in lower extremity strength function or bowel or bladder function.  He desires to proceed with a repeat epidural today.  He is trying to stay active with golf and walking but these have been difficult as of recent.  Outpatient Medications Prior to Visit  Medication Sig Dispense Refill   acetaminophen (TYLENOL) 650 MG CR tablet Take 650 mg by mouth every 8 (eight) hours as needed for pain.     apixaban (ELIQUIS) 5 MG TABS tablet Take 1 tablet (5 mg total) by mouth 2 (two) times daily. 180 tablet 1   Ascorbic Acid (VITAMIN C) 1000 MG tablet Take 1,000 mg by mouth daily.     Blood Glucose Monitoring Suppl (ONETOUCH VERIO) w/Device KIT Use to check blood sugar 3 times a day and document results, bring to appointments.  Goal is <130 fasting blood sugar and <180 two hours after meals. 1 kit 0   Cranberry 500 MG CAPS Take 500 mg by mouth daily.     fludrocortisone (FLORINEF) 0.1 MG tablet Take 100 mcg by mouth daily.     gabapentin  (NEURONTIN) 600 MG tablet TAKE 1 TABLET BY MOUTH EVERYDAY AT BEDTIME 90 tablet 4   glucose blood test strip Use to check blood sugar 3 times a day and document results, bring to appointments.  Goal is <130 fasting blood sugar and <180 two hours after meals. 100 each 12   Lancets (ONETOUCH ULTRASOFT) lancets Use to check blood sugar 3 times a day and document results, bring to appointments.  Goal is <130 fasting blood sugar and <180 two hours after meals. 100 each 12   LORazepam (ATIVAN) 1 MG tablet TAKE 1 TABLET BY MOUTH EVERYDAY AT BEDTIME 90 tablet 0   Magnesium 400 MG TABS Take 400 mg by mouth daily.      MELATONIN PO Take by mouth.     midodrine (PROAMATINE) 10 MG tablet TAKE 1 TABLET BY MOUTH THREE TIMES A DAY 270 tablet 2   Multiple Vitamin (MULTIVITAMIN WITH MINERALS) TABS tablet Take 1 tablet by mouth daily. Centrum Silver     Omega-3 Fatty Acids (FISH OIL) 1200 MG CAPS Take 1,200 mg by mouth daily.     oxymetazoline (AFRIN) 0.05 % nasal spray Place 2 sprays into both nostrils at bedtime.     OZEMPIC, 0.25 OR 0.5 MG/DOSE, 2 MG/3ML SOPN Inject 0.5 mg into the skin once a week.     pantoprazole (PROTONIX) 20 MG tablet TAKE 1 TABLET BY MOUTH EVERY DAY 90 tablet 4   potassium chloride SA (KLOR-CON) 20  MEQ tablet Take 2 tablets (40 meq) x 1 dose as directed (Patient taking differently: Take 40 mEq by mouth daily as needed (with torsemide (fluid retention)).) 10 tablet 0   QUEtiapine Fumarate (SEROQUEL XR) 150 MG 24 hr tablet TAKE 1 TABLET BY MOUTH AT BEDTIME. 90 tablet 4   rOPINIRole (REQUIP) 0.25 MG tablet Take 0.25 mg by mouth 4 (four) times daily.     rosuvastatin (CRESTOR) 40 MG tablet TAKE 1 TABLET BY MOUTH EVERY DAY 90 tablet 0   solifenacin (VESICARE) 10 MG tablet TAKE 1 TABLET BY MOUTH EVERY DAY 90 tablet 0   tiZANidine (ZANAFLEX) 4 MG tablet Take 1 tablet (4 mg total) by mouth every 6 (six) hours as needed for muscle spasms. 90 tablet 4   torsemide (DEMADEX) 20 MG tablet Take 40 mg by  mouth daily as needed (fluid retention).     venlafaxine XR (EFFEXOR XR) 75 MG 24 hr capsule Take 3 capsules (225 mg total) by mouth daily. 270 capsule 4   No facility-administered medications prior to visit.    Review of Systems CNS: No confusion or sedation Cardiac: No angina or palpitations GI: No abdominal pain or constipation Constitutional: No nausea vomiting fevers or chills  Objective:  BP (!) 173/96   Pulse 93   Temp (!) 97.2 F (36.2 C) (Temporal)   Resp 19   Ht '5\' 11"'  (1.803 m)   Wt 272 lb (123.4 kg)   SpO2 96%   BMI 37.94 kg/m    BP Readings from Last 3 Encounters:  07/28/21 (!) 173/96  05/30/21 124/80  05/23/21 136/81     Wt Readings from Last 3 Encounters:  07/28/21 272 lb (123.4 kg)  05/30/21 280 lb 12.8 oz (127.4 kg)  05/23/21 282 lb 9.6 oz (128.2 kg)     Physical Exam Pt is alert and oriented PERRL EOMI HEART IS RRR no murmur or rub LCTA no wheezing or rales MUSCULOSKELETAL reveals some paraspinous muscle tenderness but no overt trigger points.  He has a positive straight leg raise on the right side negative on the left he walks with a slightly antalgic gait with good muscle tone and bulk to the lower extremities.  Labs  Lab Results  Component Value Date   HGBA1C 5.6 05/23/2021   HGBA1C 6.6 (H) 02/21/2021   HGBA1C 6.0 (H) 11/16/2020   Lab Results  Component Value Date   MICROALBUR 80 (H) 03/23/2021   LDLCALC 67 02/21/2021   CREATININE 0.91 05/23/2021    -------------------------------------------------------------------------------------------------------------------- Lab Results  Component Value Date   WBC 6.3 03/23/2021   HGB 15.1 03/23/2021   HCT 46.0 03/23/2021   PLT 172 03/23/2021   GLUCOSE 131 (H) 05/23/2021   CHOL 154 02/21/2021   TRIG 127 02/21/2021   HDL 65 02/21/2021   LDLCALC 67 02/21/2021   ALT 28 03/23/2021   AST 27 03/23/2021   NA 142 05/23/2021   K 4.0 05/23/2021   CL 104 05/23/2021   CREATININE 0.91  05/23/2021   BUN 8 05/23/2021   CO2 24 05/23/2021   TSH 1.420 02/21/2021   INR 1.2 02/16/2014   HGBA1C 5.6 05/23/2021   MICROALBUR 80 (H) 03/23/2021    --------------------------------------------------------------------------------------------------------------------- DG PAIN CLINIC C-ARM 1-60 MIN NO REPORT  Result Date: 07/28/2021 Fluoro was used, but no Radiologist interpretation will be provided. Please refer to "NOTES" tab for provider progress note.    Assessment & Plan:   There are no diagnoses linked to this encounter.      ----------------------------------------------------------------------------------------------------------------------  Problem List Items Addressed This Visit   None     ----------------------------------------------------------------------------------------------------------------------  There are no diagnoses linked to this encounter.   ----------------------------------------------------------------------------------------------------------------------  I am having Ameir T. Profit "Tommy" start on HYDROcodone-acetaminophen and HYDROcodone-acetaminophen. I am also having him maintain his oxymetazoline, Fish Oil, Cranberry, Magnesium, acetaminophen, potassium chloride SA, torsemide, rOPINIRole, vitamin C, multivitamin with minerals, venlafaxine XR, apixaban, MELATONIN PO, tiZANidine, OneTouch Verio, onetouch ultrasoft, glucose blood, QUEtiapine Fumarate, pantoprazole, Ozempic (0.25 or 0.5 MG/DOSE), fludrocortisone, solifenacin, rosuvastatin, gabapentin, LORazepam, and midodrine. We administered triamcinolone acetonide, sodium chloride flush, ropivacaine (PF) 2 mg/mL (0.2%), and lidocaine (PF).   Meds ordered this encounter  Medications   triamcinolone acetonide (KENALOG-40) injection 40 mg   sodium chloride flush (NS) 0.9 % injection 10 mL   ropivacaine (PF) 2 mg/mL (0.2%) (NAROPIN) injection 10 mL   midazolam (VERSED) injection 2 mg    lidocaine (PF) (XYLOCAINE) 1 % injection 5 mL   lactated ringers infusion   iopamidol (ISOVUE-M) 41 % intrathecal injection 20 mL   HYDROcodone-acetaminophen (NORCO/VICODIN) 5-325 MG tablet    Sig: Take 1 tablet by mouth 2 (two) times daily as needed for moderate pain or severe pain.    Dispense:  60 tablet    Refill:  0   HYDROcodone-acetaminophen (NORCO/VICODIN) 5-325 MG tablet    Sig: Take 1 tablet by mouth 2 (two) times daily as needed for moderate pain or severe pain.    Dispense:  60 tablet    Refill:  0   Patient's Medications  New Prescriptions   HYDROCODONE-ACETAMINOPHEN (NORCO/VICODIN) 5-325 MG TABLET    Take 1 tablet by mouth 2 (two) times daily as needed for moderate pain or severe pain.   HYDROCODONE-ACETAMINOPHEN (NORCO/VICODIN) 5-325 MG TABLET    Take 1 tablet by mouth 2 (two) times daily as needed for moderate pain or severe pain.  Previous Medications   ACETAMINOPHEN (TYLENOL) 650 MG CR TABLET    Take 650 mg by mouth every 8 (eight) hours as needed for pain.   APIXABAN (ELIQUIS) 5 MG TABS TABLET    Take 1 tablet (5 mg total) by mouth 2 (two) times daily.   ASCORBIC ACID (VITAMIN C) 1000 MG TABLET    Take 1,000 mg by mouth daily.   BLOOD GLUCOSE MONITORING SUPPL (ONETOUCH VERIO) W/DEVICE KIT    Use to check blood sugar 3 times a day and document results, bring to appointments.  Goal is <130 fasting blood sugar and <180 two hours after meals.   CRANBERRY 500 MG CAPS    Take 500 mg by mouth daily.   FLUDROCORTISONE (FLORINEF) 0.1 MG TABLET    Take 100 mcg by mouth daily.   GABAPENTIN (NEURONTIN) 600 MG TABLET    TAKE 1 TABLET BY MOUTH EVERYDAY AT BEDTIME   GLUCOSE BLOOD TEST STRIP    Use to check blood sugar 3 times a day and document results, bring to appointments.  Goal is <130 fasting blood sugar and <180 two hours after meals.   LANCETS (ONETOUCH ULTRASOFT) LANCETS    Use to check blood sugar 3 times a day and document results, bring to appointments.  Goal is <130 fasting  blood sugar and <180 two hours after meals.   LORAZEPAM (ATIVAN) 1 MG TABLET    TAKE 1 TABLET BY MOUTH EVERYDAY AT BEDTIME   MAGNESIUM 400 MG TABS    Take 400 mg by mouth daily.    MELATONIN PO    Take by mouth.   MIDODRINE (PROAMATINE) 10  MG TABLET    TAKE 1 TABLET BY MOUTH THREE TIMES A DAY   MULTIPLE VITAMIN (MULTIVITAMIN WITH MINERALS) TABS TABLET    Take 1 tablet by mouth daily. Centrum Silver   OMEGA-3 FATTY ACIDS (FISH OIL) 1200 MG CAPS    Take 1,200 mg by mouth daily.   OXYMETAZOLINE (AFRIN) 0.05 % NASAL SPRAY    Place 2 sprays into both nostrils at bedtime.   OZEMPIC, 0.25 OR 0.5 MG/DOSE, 2 MG/3ML SOPN    Inject 0.5 mg into the skin once a week.   PANTOPRAZOLE (PROTONIX) 20 MG TABLET    TAKE 1 TABLET BY MOUTH EVERY DAY   POTASSIUM CHLORIDE SA (KLOR-CON) 20 MEQ TABLET    Take 2 tablets (40 meq) x 1 dose as directed   QUETIAPINE FUMARATE (SEROQUEL XR) 150 MG 24 HR TABLET    TAKE 1 TABLET BY MOUTH AT BEDTIME.   ROPINIROLE (REQUIP) 0.25 MG TABLET    Take 0.25 mg by mouth 4 (four) times daily.   ROSUVASTATIN (CRESTOR) 40 MG TABLET    TAKE 1 TABLET BY MOUTH EVERY DAY   SOLIFENACIN (VESICARE) 10 MG TABLET    TAKE 1 TABLET BY MOUTH EVERY DAY   TIZANIDINE (ZANAFLEX) 4 MG TABLET    Take 1 tablet (4 mg total) by mouth every 6 (six) hours as needed for muscle spasms.   TORSEMIDE (DEMADEX) 20 MG TABLET    Take 40 mg by mouth daily as needed (fluid retention).   VENLAFAXINE XR (EFFEXOR XR) 75 MG 24 HR CAPSULE    Take 3 capsules (225 mg total) by mouth daily.  Modified Medications   No medications on file  Discontinued Medications   No medications on file   ----------------------------------------------------------------------------------------------------------------------  Follow-up: Return in about 2 months (around 09/28/2021) for evaluation, med refill.   1. Lumbar radiculopathy   2. Degeneration of lumbar intervertebral disc   3. Bilateral sciatica   4. Chronic, continuous use of opioids    5. Abdominal pain, chronic, right lower quadrant   6. Chronic pain syndrome   7. Facet syndrome, lumbar   8. Osteoarthritis of right hip, unspecified osteoarthritis type   9. Bilateral hand pain   Based on our discussion today I think it is appropriate to proceed with a repeat epidural.  We gone over the risks and benefits of this with him in full detail all questions were answered.  He has responded favorably in the past.  Hopefully this will get his pain under better control.  I want him to stay active with stretching strengthening exercises once again reviewed.  Return to golf is acceptable.  Continue with current medication management I have reviewed the [facility] practitioner database information it is appropriate to keep him on his current medication.  This continues to work well for him and he is able to derive good functional lifestyle improvement with the medications where he has failed more conservative therapy in the past.  Return to clinic in 2 months   Procedure: L5-S1 LESI with fluoroscopic guidance and without moderate sedation  NOTE: The risks, benefits, and expectations of the procedure have been discussed and explained to the patient who was understanding and in agreement with suggested treatment plan. No guarantees were made.  DESCRIPTION OF PROCEDURE: Lumbar epidural steroid injection with no IV Versed, EKG, blood pressure, pulse, and pulse oximetry monitoring. The procedure was performed with the patient in the prone position under fluoroscopic guidance.  Sterile prep x3 was initiated and I then  injected subcutaneous lidocaine to the overlying L5-S1 site after its fluoroscopic identifictation.  Using strict aseptic technique, I then advanced an 18-gauge Tuohy epidural needle in the midline using interlaminar approach via loss-of-resistance to saline technique. There was negative aspiration for heme or  CSF.  I then confirmed position with both AP and Lateral fluoroscan.  2 cc  of contrast dye were injected and a  total of 5 mL of Preservative-Free normal saline mixed with 40 mg of Kenalog and 1cc Ropicaine 0.2 percent were injected incrementally via the  epidurally placed needle. The needle was removed. The patient tolerated the injection well and was convalesced and discharged to home in stable condition. Should the patient have any post procedure difficulty they have been instructed on how to contact us for assistance.     [name], MD

## 2021-08-24 ENCOUNTER — Ambulatory Visit (INDEPENDENT_AMBULATORY_CARE_PROVIDER_SITE_OTHER): Payer: Medicare Other | Admitting: Nurse Practitioner

## 2021-08-24 ENCOUNTER — Encounter: Payer: Self-pay | Admitting: Nurse Practitioner

## 2021-08-24 VITALS — BP 127/81 | HR 90 | Temp 98.2°F | Ht 71.0 in | Wt 279.4 lb

## 2021-08-24 DIAGNOSIS — I152 Hypertension secondary to endocrine disorders: Secondary | ICD-10-CM

## 2021-08-24 DIAGNOSIS — E785 Hyperlipidemia, unspecified: Secondary | ICD-10-CM

## 2021-08-24 DIAGNOSIS — F339 Major depressive disorder, recurrent, unspecified: Secondary | ICD-10-CM

## 2021-08-24 DIAGNOSIS — N1831 Chronic kidney disease, stage 3a: Secondary | ICD-10-CM | POA: Diagnosis not present

## 2021-08-24 DIAGNOSIS — I442 Atrioventricular block, complete: Secondary | ICD-10-CM

## 2021-08-24 DIAGNOSIS — E1159 Type 2 diabetes mellitus with other circulatory complications: Secondary | ICD-10-CM

## 2021-08-24 DIAGNOSIS — F419 Anxiety disorder, unspecified: Secondary | ICD-10-CM

## 2021-08-24 DIAGNOSIS — G894 Chronic pain syndrome: Secondary | ICD-10-CM

## 2021-08-24 DIAGNOSIS — N400 Enlarged prostate without lower urinary tract symptoms: Secondary | ICD-10-CM

## 2021-08-24 DIAGNOSIS — Z79899 Other long term (current) drug therapy: Secondary | ICD-10-CM

## 2021-08-24 DIAGNOSIS — G4733 Obstructive sleep apnea (adult) (pediatric): Secondary | ICD-10-CM

## 2021-08-24 DIAGNOSIS — I480 Paroxysmal atrial fibrillation: Secondary | ICD-10-CM | POA: Diagnosis not present

## 2021-08-24 DIAGNOSIS — N3000 Acute cystitis without hematuria: Secondary | ICD-10-CM

## 2021-08-24 DIAGNOSIS — E1169 Type 2 diabetes mellitus with other specified complication: Secondary | ICD-10-CM | POA: Diagnosis not present

## 2021-08-24 DIAGNOSIS — I7143 Infrarenal abdominal aortic aneurysm, without rupture: Secondary | ICD-10-CM

## 2021-08-24 DIAGNOSIS — E669 Obesity, unspecified: Secondary | ICD-10-CM

## 2021-08-24 DIAGNOSIS — Z950 Presence of cardiac pacemaker: Secondary | ICD-10-CM

## 2021-08-24 DIAGNOSIS — R8281 Pyuria: Secondary | ICD-10-CM

## 2021-08-24 DIAGNOSIS — I951 Orthostatic hypotension: Secondary | ICD-10-CM

## 2021-08-24 LAB — BAYER DCA HB A1C WAIVED: HB A1C (BAYER DCA - WAIVED): 5.9 % — ABNORMAL HIGH (ref 4.8–5.6)

## 2021-08-24 LAB — URINALYSIS, ROUTINE W REFLEX MICROSCOPIC
Bilirubin, UA: NEGATIVE
Glucose, UA: NEGATIVE
Ketones, UA: NEGATIVE
Nitrite, UA: NEGATIVE
RBC, UA: NEGATIVE
Specific Gravity, UA: >1.03 — ABNORMAL HIGH (ref 1.005–1.030)
Urobilinogen, Ur: 1 mg/dL (ref 0.2–1.0)
pH, UA: 6 (ref 5.0–7.5)

## 2021-08-24 LAB — MICROSCOPIC EXAMINATION: RBC, Urine: NONE SEEN /hpf (ref 0–2)

## 2021-08-24 MED ORDER — MIDODRINE HCL 10 MG PO TABS: 5.0000 mg | ORAL_TABLET | Freq: Three times a day (TID) | ORAL | 2 refills | Status: DC

## 2021-08-24 MED ORDER — AMOXICILLIN-POT CLAVULANATE 875-125 MG PO TABS: 1.0000 | ORAL_TABLET | Freq: Two times a day (BID) | ORAL | 0 refills | Status: AC

## 2021-08-24 MED ORDER — PANTOPRAZOLE SODIUM 20 MG PO TBEC: 20.0000 mg | DELAYED_RELEASE_TABLET | Freq: Two times a day (BID) | ORAL | 4 refills | Status: DC

## 2021-08-24 NOTE — Assessment & Plan Note (Signed)
BMI 38.97 with HTN, A-Fib, Heart Block, and CKD.  Recommended eating smaller high protein, low fat meals more frequently and exercising 30 mins a day 5 times a week with a goal of 10-15lb weight loss in the next 3 months. Patient voiced their understanding and motivation to adhere to these recommendations.

## 2021-08-24 NOTE — Assessment & Plan Note (Signed)
Ongoing and stable, reports better sleep pattern with CPAP.  Have recommended 100% use and trial cut back on Ativan now that has CPAP.

## 2021-08-24 NOTE — Assessment & Plan Note (Signed)
Chronic, ongoing with BP at goal in office today.  Followed by cardiology.  With orthostatic BP presenting occasionally will continue current regimen at this time and have recommended utilizing compression hose at home, agrees to try this.  Continue collaboration with cardiology + neurology and current medication regimen.  Labs: CMP.  Recommend ensuring good fluid intake at home.

## 2021-08-24 NOTE — Assessment & Plan Note (Signed)
Ongoing and stable.  Followed by cardiology.  Pacemaker checks by them.  

## 2021-08-24 NOTE — Assessment & Plan Note (Signed)
Chronic, stable, followed by cardiology.  Rate control present.  Continue current medication regimen as prescribed by them.  Labs: CMP.  Ablation last on 05/17/20.

## 2021-08-24 NOTE — Assessment & Plan Note (Signed)
Has been on Ativan for many years, with trial of cutting back unsuccessful.  At length discussions about risk of opioid and benzo use had with patient and wife by both PCP and CCM PharmD.  Continue these discussions.  Refer to anxiety plan. 

## 2021-08-24 NOTE — Assessment & Plan Note (Signed)
Chronic, ongoing followed by pain clinic, Dr. Adams.  Discussed at length risk of benzo and opioid use in conjunction with each other.  Recommend he not take the two together at same hour during day or evening.   

## 2021-08-24 NOTE — Assessment & Plan Note (Signed)
Continue collaboration with cardiology + neurology + medication regimen as prescribed by them.  Recommend ensuring good hydration throughout daytime hours + use of compression hose at home (on during daytime and off during night), agrees to try them, although does endorse not wearing. 

## 2021-08-24 NOTE — Progress Notes (Signed)
BP 127/81   Pulse 90   Temp 98.2 F (36.8 C) (Oral)   Ht '5\' 11"'$  (1.803 m)   Wt 279 lb 6.4 oz (126.7 kg)   SpO2 94%   BMI 38.97 kg/m    Subjective:    Patient ID: Nicholas [name], male    DOB: 05/02/1945, 76 y.o.   MRN: [id number]  HPI: Nicholas [name] is a 76 y.o. male  Chief Complaint  Patient presents with   Diabetes    Patient is not due for an Eye Exam until October.    Hyperlipidemia   Hypertension   Chronic Kidney Disease   Medication Problem    Patient says he is having heartburn and acid reflux with prescription of Ozempic. Patient says he was supposed to take an injection yesterday and he skipped.    DIABETES Last visit in May A1c was 5.6% -- this is with Ozempic on board.  He has had increase heart burn and abdominal discomfort with this on board.  Has not had heart burn in years.  Would like to stop this medication. Hypoglycemic episodes:no Polydipsia/polyuria: no Visual disturbance: no Chest pain: no Paresthesias: no Glucose Monitoring: yes  Accucheck frequency: Daily  Fasting glucose: had one level 144 -- on average 110 range  Post prandial:  Evening:  Before meals: Taking Insulin?: no  Long acting insulin:  Short acting insulin: Blood Pressure Monitoring: daily Retinal Examination: Up To Date -- Dr. Hall Busing Exam: Up to Date Pneumovax: Up to Date Influenza: Up to Date Aspirin: no   HYPERTENSION / HYPERLIPIDEMIA/A-FIB Last saw cardiology 04/05/21, does not need to return for one year.  Had ablation on 05/17/20.  Echo last October 2021 noting EF 60-65%., no LVH, and Grade I Diastolic dysfunction.  History of AAA on imaging, but recent in March 2023 no aneurysm present. Last pacemaker check 06/13/21.  Diagnosed with OSA in June 2022 -- is using CPAP 100% of the time and finds benefit with sleep pattern and this.    Neurology last 05/10/21 for neuropathy and orthostatic BP -- continues Midodrine 5 MG TID for orthostatic hypotension (his wife reports they  tried 2.5 MG TID and this caused more dizziness) and on Florinef 100 MCG daily -- Parkinson's testing was negative.     Continues on Rosuvastatin, Eliquis, fish oil. Satisfied with current treatment? yes Duration of hypertension: chronic BP monitoring frequency: not checking BP range:  BP medication side effects: no Duration of hyperlipidemia: chronic Cholesterol medication side effects: no Cholesterol supplements: fish oil Medication compliance: good compliance Aspirin: no Recent stressors: no Recurrent headaches: no Visual changes: no Palpitations: no Dyspnea: if exerting self a lot -- walking up a hill Chest pain: no Lower extremity edema: no Dizzy/lightheaded: occasionally  CHRONIC KIDNEY DISEASE Improving on recent labs CKD status: stable Medications renally dose: yes Previous renal evaluation: no Pneumovax:  Up to Date Influenza Vaccine:  Up to Date   BPH Last saw urology, Dr. Erlene Quan, on 10/05/20, elevation in PSA, but CT scan most recent showed unremarkable prostate.  Taking Vesicare.  Does report lower abdominal pain ongoing recently and would like urine checked. BPH status: stable Satisfied with current treatment?: yes Medication side effects: no Medication compliance: good compliance Duration: chronic Nocturia: none Urinary frequency:no Incomplete voiding: yes Urgency: no Weak urinary stream: no Straining to start stream: no Dysuria: no Onset: gradual Severity: mild Alleviating factors:  Aggravating factors:  Treatments attempted: Tamsulosin caused more dizziness  DEPRESSION & CHRONIC PAIN He is a  Veteran.  Taking Effexor, Seroquel, and Ativan.  Pt and his wife at bedside made aware of risks of benzo medication use to include increased sedation, respiratory suppression, falls, extrapyramidal movements, dependence and cardiovascular events.  Pt and his wife would like to continue treatment as benefit determined to outweigh risk.  Have had at length  discussions with him that he is also on opioid therapy.  Discussed risks with taking these three medications together at same time and recommend to separate them when taking Seroquel, Ativan, and opioid.   He has tried in past taking 1/2 tablet Ativan, but this does not work well (per wife and patient) and wishes to maintain current dosing.  Last Ativan fill on PDMP review 07/25/21 and last opioid fill 07/30/21.  Recent UDS with pain management on 10/04/20. Saw Dr. Andree Elk on 07/28/21 last -- received injection for bursitis. Duration:stable Anxious mood: yes  Excessive worrying: yes Irritability: yes Sweating: no Nausea: no Palpitations:no Hyperventilation: no Panic attacks: no Agoraphobia: no  Obscessions/compulsions: no Depressed mood: yes    08/24/2021    8:38 AM 05/23/2021    9:05 AM 05/02/2021    8:39 AM 02/28/2021    1:07 PM 02/21/2021   11:10 AM  Depression screen PHQ 2/9  Decreased Interest '2 3 3 '$ 0 0  Down, Depressed, Hopeless '3 3 3 '$ 0 0  PHQ - 2 Score '5 6 6 '$ 0 0  Altered sleeping 3 0 1  1  Tired, decreased energy '3 2 3  '$ 0  Change in appetite '2 2 1  '$ 0  Feeling bad or failure about yourself  '3 2 2  '$ 0  Trouble concentrating 1 0 0  0  Moving slowly or fidgety/restless 0 0 0  0  Suicidal thoughts 3 3 0  0  PHQ-9 Score '20 15 13  1  '$ Difficult doing work/chores   Very difficult  Not difficult at all       08/24/2021    8:38 AM 05/23/2021    9:05 AM 02/21/2021   11:10 AM 05/12/2020    9:58 AM  GAD 7 : Generalized Anxiety Score  Nervous, Anxious, on Edge 3 0 0 0  Control/stop worrying 3 3 0 1  Worry too much - different things 3 3 0 1  Trouble relaxing 3 1 0 0  Restless 3 0 0 0  Easily annoyed or irritable 3 2 0 0  Afraid - awful might happen 3 2 0 0  Total GAD 7 Score 21 11 0 2  Anxiety Difficulty Extremely difficult Somewhat difficult Not difficult at all Not difficult at all    Relevant past medical, surgical, family and social history reviewed and updated as indicated. Interim medical  history since our last visit reviewed. Allergies and medications reviewed and updated.  Review of Systems  Constitutional:  Negative for activity change, appetite change, fatigue and fever.  Respiratory:  Negative for cough, chest tightness, shortness of breath and wheezing.   Cardiovascular:  Negative for chest pain, palpitations and leg swelling.  Endocrine: Negative for polydipsia, polyphagia and polyuria.  Musculoskeletal:  Positive for arthralgias.  Neurological: Negative.   Psychiatric/Behavioral:  Negative for decreased concentration, self-injury, sleep disturbance and suicidal ideas. The patient is not nervous/anxious.     Per HPI unless specifically indicated above     Objective:    BP 127/81   Pulse 90   Temp 98.2 F (36.8 C) (Oral)   Ht '5\' 11"'$  (1.803 m)   Wt 279 lb 6.4 oz (  126.7 kg)   SpO2 94%   BMI 38.97 kg/m   Wt Readings from Last 3 Encounters:  08/24/21 279 lb 6.4 oz (126.7 kg)  07/28/21 272 lb (123.4 kg)  05/30/21 280 lb 12.8 oz (127.4 kg)    Physical Exam Vitals and nursing note reviewed.  Constitutional:      General: He is awake. He is not in acute distress.    Appearance: He is well-developed and well-groomed. He is obese.  HENT:     Head: Normocephalic and atraumatic.     Right Ear: Hearing normal. No drainage.     Left Ear: Hearing normal. No drainage.  Eyes:     General: Lids are normal.        Right eye: No discharge.        Left eye: No discharge.     Conjunctiva/sclera: Conjunctivae normal.     Pupils: Pupils are equal, round, and reactive to light.  Neck:     Thyroid: No thyromegaly.     Vascular: No carotid bruit.  Cardiovascular:     Rate and Rhythm: Normal rate and regular rhythm.     Heart sounds: Normal heart sounds, S1 normal and S2 normal. No murmur heard.    No gallop.  Pulmonary:     Effort: Pulmonary effort is normal. No accessory muscle usage or respiratory distress.     Breath sounds: Normal breath sounds.  Abdominal:      General: Bowel sounds are normal. There is no distension.     Palpations: Abdomen is soft.     Tenderness: There is no abdominal tenderness.  Musculoskeletal:        General: Normal range of motion.     Cervical back: Normal range of motion and neck supple.     Right lower leg: No edema.     Left lower leg: No edema.  Skin:    General: Skin is warm and dry.  Neurological:     Mental Status: He is alert and oriented to person, place, and time.  Psychiatric:        Attention and Perception: Attention normal.        Mood and Affect: Mood normal.        Behavior: Behavior normal. Behavior is cooperative.        Thought Content: Thought content normal.    Results for orders placed or performed in visit on 08/24/21  Microscopic Examination   BLD  Result Value Ref Range   WBC, UA 11-30 (A) 0 - 5 /hpf   RBC, Urine None seen 0 - 2 /hpf   Epithelial Cells (non renal) 0-10 0 - 10 /hpf   Mucus, UA Present (A) Not Estab.   Bacteria, UA Few (A) None seen/Few  Bayer DCA Hb A1c Waived  Result Value Ref Range   HB A1C (BAYER DCA - WAIVED) 5.9 (H) 4.8 - 5.6 %  Urinalysis, Routine w reflex microscopic  Result Value Ref Range   Specific Gravity, UA >1.030 (H) 1.005 - 1.030   pH, UA 6.0 5.0 - 7.5   Color, UA Yellow Yellow   Appearance Ur Cloudy (A) Clear   Leukocytes,UA 2+ (A) Negative   Protein,UA Trace (A) Negative/Trace   Glucose, UA Negative Negative   Ketones, UA Negative Negative   RBC, UA Negative Negative   Bilirubin, UA Negative Negative   Urobilinogen, Ur 1.0 0.2 - 1.0 mg/dL   Nitrite, UA Negative Negative   Microscopic Examination See below:  Assessment & Plan:   Problem List Items Addressed This Visit       Cardiovascular and Mediastinum   PAF (paroxysmal atrial fibrillation) (HCC) (Chronic)    Chronic, stable, followed by cardiology.  Rate control present.  Continue current medication regimen as prescribed by them.  Labs: CMP.  Ablation last on 05/17/20.       Relevant Medications   midodrine (PROAMATINE) 10 MG tablet   Chronic orthostatic hypotension    Continue collaboration with cardiology + neurology + medication regimen as prescribed by them.  Recommend ensuring good hydration throughout daytime hours + use of compression hose at home (on during daytime and off during night), agrees to try them, although does endorse not wearing.      Relevant Medications   midodrine (PROAMATINE) 10 MG tablet   Complete heart block (HCC)    Ongoing and stable.  Followed by cardiology.  Pacemaker checks by them.       Relevant Medications   midodrine (PROAMATINE) 10 MG tablet   Hypertension associated with diabetes ([place])    Chronic, ongoing with BP at goal in office today.  Followed by cardiology.  With orthostatic BP presenting occasionally will continue current regimen at this time and have recommended utilizing compression hose at home, agrees to try this.  Continue collaboration with cardiology + neurology and current medication regimen.  Labs: CMP.  Recommend ensuring good fluid intake at home.        Relevant Medications   midodrine (PROAMATINE) 10 MG tablet   Other Relevant Orders   Bayer DCA Hb A1c Waived (Completed)   Comprehensive metabolic panel     Respiratory   OSA (obstructive sleep apnea)    Ongoing and stable, reports better sleep pattern with CPAP.  Have recommended 100% use and trial cut back on Ativan now that has CPAP.        Endocrine   Hyperlipidemia associated with type 2 diabetes mellitus (HCC)    Chronic, ongoing.  Continue current medication regimen and adjust as needed. Lipid panel today.      Relevant Medications   midodrine (PROAMATINE) 10 MG tablet   Other Relevant Orders   Bayer DCA Hb A1c Waived (Completed)   Comprehensive metabolic panel   Lipid Panel w/o Chol/HDL Ratio   Type 2 diabetes mellitus with obesity ([place]) - Primary    Diagnosed on 02/21/21, started on Ozempic, but is no longer tolerating.  A1c much  improved and remains stable today at 5.9%.  At this time will stop Ozempic due to GI affects and trial diet only focus, if elevations will consider SGLT2. Glucometer supplies present, recommend he check sugars at least daily fasting.  Labs today.  Return in 3 months.      Relevant Orders   Bayer DCA Hb A1c Waived (Completed)     Genitourinary   Stage 3a chronic kidney disease (HCC) (Chronic)    Ongoing stage 3a, recheck CMP today and adjust medications as needed.  His recent labs since July 2022 have been within good ranges and improved.      Relevant Orders   Comprehensive metabolic panel   BPH (benign prostatic hyperplasia)    Chronic, stable with minimal symptoms.  PSA has had some elevations but in normal range for age and ethnicity recent check -- suspect more related to BPH.  Recommend he continue to visit with urology.      Relevant Orders   PSA   Urinalysis, Routine w reflex microscopic  Other   Anxiety (Chronic)    Chronic, ongoing.  Discussed at length risk of benzo and opioid use in conjunction with each other.  Recommend he not take the two together at same hour during day or evening. Recommend now that has CPAP and improved sleep trial cut back slowly on Ativan.   Continue to collaborate with CCM team on education.  Provided wife and him with copy of [facility] ben[facility] risk information patient sheet in past and they are aware of risks.  They request 90 day supply, as previous PCP supplied this.  Are aware he does have to return every 3 months for refills.  UDS up to date with pain management.  Refills sent.        Chronic pain syndrome (Chronic)    Chronic, ongoing followed by pain clinic, Dr. Andree Elk.  Discussed at length risk of benzo and opioid use in conjunction with each other.  Recommend he not take the two together at same hour during day or evening.        Depression, recurrent (HCC) (Chronic)    Chronic, ongoing.  Denies SI/HI.  Continue current medication regimen and  adjust as needed.  Would benefit from trial off Effexor and trial of SSRI, but refuses this.        Long-term current use of benzodiazepine (Chronic)    Has been on Ativan for many years, with trial of cutting back unsuccessful.  At length discussions about risk of opioid and benzo use had with patient and wife by both PCP and CCM PharmD.  Continue these discussions.  Refer to anxiety plan.      Morbid obesity (HCC) (Chronic)    BMI 38.97 with HTN, A-Fib, Heart Block, and CKD.  Recommended eating smaller high protein, low fat meals more frequently and exercising 30 mins a day 5 times a week with a goal of 10-15lb weight loss in the next 3 months. Patient voiced their understanding and motivation to adhere to these recommendations.       Pacemaker - MDT (Chronic)    Regular checks with cardiology.      Other Visit Diagnoses     Acute cystitis without hematuria       UA noting possible UTI, will send for culture and start Augmentin -- adjust as needed based on culture.   Pyuria       Urine for culture   Relevant Orders   Urine Culture        Follow up plan: Return in about 3 months (around 11/24/2021) for T2DM, HTN/HLD, MOOD.

## 2021-08-24 NOTE — Assessment & Plan Note (Signed)
Ongoing stage 3a, recheck CMP today and adjust medications as needed.  His recent labs since July 2022 have been within good ranges and improved. 

## 2021-08-24 NOTE — Assessment & Plan Note (Signed)
Chronic, ongoing.  Discussed at length risk of benzo and opioid use in conjunction with each other.  Recommend he not take the two together at same hour during day or evening. Recommend now that has CPAP and improved sleep trial cut back slowly on Ativan.   Continue to collaborate with CCM team on education.  Provided wife and him with copy of [facility] ben[facility] risk information patient sheet in past and they are aware of risks.  They request 90 day supply, as previous PCP supplied this.  Are aware he does have to return every 3 months for refills.  UDS up to date with pain management.  Refills sent.   

## 2021-08-24 NOTE — Patient Instructions (Signed)

## 2021-08-24 NOTE — Assessment & Plan Note (Signed)
Chronic, stable with minimal symptoms.  PSA has had some elevations but in normal range for age and ethnicity recent check -- suspect more related to BPH.  Recommend he continue to visit with urology.

## 2021-08-24 NOTE — Assessment & Plan Note (Signed)
Regular checks with cardiology. 

## 2021-08-24 NOTE — Assessment & Plan Note (Signed)
Chronic, ongoing.  Denies SI/HI.  Continue current medication regimen and adjust as needed.  Would benefit from trial off Effexor and trial of SSRI, but refuses this.   

## 2021-08-24 NOTE — Assessment & Plan Note (Signed)
Chronic, ongoing.  Continue current medication regimen and adjust as needed. Lipid panel today. 

## 2021-08-24 NOTE — Progress Notes (Signed)
Contacted via MyChart   Does look like an infection may be present in urine.  I am sending Augmentin to treat and will send urine for culture -- if it returns noting need for different antibiotic I will let you know.  Any questions?

## 2021-08-24 NOTE — Assessment & Plan Note (Signed)
Diagnosed on 02/21/21, started on Ozempic, but is no longer tolerating.  A1c much improved and remains stable today at 5.9%.  At this time will stop Ozempic due to GI affects and trial diet only focus, if elevations will consider SGLT2. Glucometer supplies present, recommend he check sugars at least daily fasting.  Labs today.  Return in 3 months.

## 2021-08-25 LAB — PSA: Prostate Specific Ag, Serum: 7.1 ng/mL — ABNORMAL HIGH (ref 0.0–4.0)

## 2021-08-25 LAB — COMPREHENSIVE METABOLIC PANEL
ALT: 36 IU/L (ref 0–44)
AST: 32 IU/L (ref 0–40)
Albumin/Globulin Ratio: 2 (ref 1.2–2.2)
Albumin: 4.3 g/dL (ref 3.8–4.8)
Alkaline Phosphatase: 55 IU/L (ref 44–121)
BUN/Creatinine Ratio: 16 (ref 10–24)
BUN: 16 mg/dL (ref 8–27)
Bilirubin Total: 0.6 mg/dL (ref 0.0–1.2)
CO2: 22 mmol/L (ref 20–29)
Calcium: 9.1 mg/dL (ref 8.6–10.2)
Chloride: 106 mmol/L (ref 96–106)
Creatinine, Ser: 1.01 mg/dL (ref 0.76–1.27)
Globulin, Total: 2.1 g/dL (ref 1.5–4.5)
Glucose: 134 mg/dL — ABNORMAL HIGH (ref 70–99)
Potassium: 4.1 mmol/L (ref 3.5–5.2)
Sodium: 145 mmol/L — ABNORMAL HIGH (ref 134–144)
Total Protein: 6.4 g/dL (ref 6.0–8.5)
eGFR: 77 mL/min/{1.73_m2} (ref >59)

## 2021-08-25 LAB — LIPID PANEL W/O CHOL/HDL RATIO
Cholesterol, Total: 129 mg/dL (ref 100–199)
HDL: 55 mg/dL (ref >39)
LDL Chol Calc (NIH): 59 mg/dL (ref 0–99)
Triglycerides: 77 mg/dL (ref 0–149)
VLDL Cholesterol Cal: 15 mg/dL (ref 5–40)

## 2021-08-25 NOTE — Progress Notes (Signed)
Contacted via [place] -- please call to make sure he got below message, thanks--  Good evening Tommy, your labs have returned: - Kidney function, creatinine and eGFR, remains normal.  However, your sodium (salt) level is a little elevated.  Please cut back on salt intake and increase water daily.  We will recheck next visit. - Cholesterol levels stable. - My main concern is your PSA, prostate lab, recheck which has trended up quite a bit.  I would like you to return to see urology ASAP -- this could be related to current infection we are treating, but I would like Dr. Cherrie Gauze recommendations on this as previous levels were in more normal range for your age and ethnicity.  With this abdominal discomfort I would like you to get looked at ASAP.  Please let me know you got this message.  Thank you.  Any questions? Keep being amazing!!  Thank you for allowing me to participate in your care.  I appreciate you. Kindest regards, Yarethzy Croak

## 2021-09-06 ENCOUNTER — Ambulatory Visit: Payer: Medicare Other | Admitting: Urology

## 2021-09-06 ENCOUNTER — Encounter: Payer: Self-pay | Admitting: Urology

## 2021-09-06 VITALS — BP 118/74 | Ht 70.0 in | Wt 282.0 lb

## 2021-09-06 DIAGNOSIS — N400 Enlarged prostate without lower urinary tract symptoms: Secondary | ICD-10-CM

## 2021-09-06 DIAGNOSIS — R972 Elevated prostate specific antigen [PSA]: Secondary | ICD-10-CM | POA: Diagnosis not present

## 2021-09-06 DIAGNOSIS — R1031 Right lower quadrant pain: Secondary | ICD-10-CM | POA: Diagnosis not present

## 2021-09-06 NOTE — Progress Notes (Signed)
09/06/21 3:58 PM   Nicholas [name] 1945-02-09 [id number]  Referring provider:  Venita Lick, NP [address] [place],  [place] [address] Chief Complaint  Patient presents with   Follow-up      HPI: Nicholas [name] is a 76 y.o.male with a personal history of BX0 s/p circumcision in 08/2012 and urethral stricture disease s/p DVUI on 11/2013, elevated/ rising PSA, bladder thickening, prostate calcification and right hydronephrosis, who presents today for    He underwent a CT abdomen and pelvis w/ contrast on 09/20/2020 that revealed slight dilatation of the right renal collecting system with possible punctate stone at the right UVJ and thick-walled urinary bladder probably due to under distension though consider cystitis.  His most recent imaging was on 04/08/2021 to further evaluate ongoing right lower quadrant abdominal pain visualized adrenal glands are unremarkable. Kidneys are normal, without renal calculi, focal lesion, or hydronephrosis. Bladder is unremarkable.   He was told to follow-up today primarily for his PSA.  Has been elevated in the past.  Most recently, 7.1 on 08/24/2021.  Previously primarily been in the 4-5 range.  Continues to have right lower quadrant pain.  It is primarily located just over his right hip.  It is constant but worse with standing, moving, improves with sitting.  He had extensive evaluation including with orthopedics.  Dr. Andree Elk his pain doctor thinks that it is likely musculoskeletal related.  He had a CT scan in March 2023 which was negative for any GU pathology.  PMH: Past Medical History:  Diagnosis Date   Abdominal pain, chronic, right lower quadrant 12/13/2020   Anterior urethral stricture    Anxiety    Arthritis    a. knees, hips, hands;  b. 11/2013 s/p L TKA @ [place].   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 Dr  Caryl Comes   Depression    DVT (deep venous thrombosis) ([place])    Erosive esophagitis    Gross hematuria    Hyperlipemia    Hypertension    borderline   Internal hemorrhoids    Phimosis    Pulmonary embolism ([place])     Surgical History: Past Surgical History:  Procedure Laterality Date   ATRIAL FIBRILLATION ABLATION N/A 05/17/2020   Procedure: ATRIAL FIBRILLATION ABLATION;  Surgeon: Vickie Epley, MD;  Location: [place] [facility];  Service: Cardiovascular;  Laterality: N/A;   BUNIONECTOMY Bilateral 01/06/2015   Procedure: Lillard Anes;  Surgeon: [name], MD;  Location: [facility] ORS;  Service: Orthopedics;  Laterality: Bilateral;   CARDIAC CATHETERIZATION  ~ 2005   "once"   CATARACT EXTRACTION W/ INTRAOCULAR LENS  IMPLANT, BILATERAL Bilateral ~ 2010   COLONOSCOPY WITH PROPOFOL N/A 12/07/2015   Procedure: COLONOSCOPY WITH PROPOFOL;  Surgeon: Lollie Sails, MD;  Location: [facility] ENDOSCOPY;  Service: Endoscopy;  Laterality: N/A;   COLONOSCOPY WITH PROPOFOL N/A 11/26/2020   Procedure: COLONOSCOPY WITH PROPOFOL;  Surgeon: Lin Landsman, MD;  Location: [facility] ENDOSCOPY;  Service: Gastroenterology;  Laterality: N/A;   EP IMPLANTABLE DEVICE N/A 02/16/2016   MDT dual chamber (His Bundle) pacemaker implanted by Dr Caryl Comes for intermittent complete heart block   ESOPHAGOGASTRODUODENOSCOPY (EGD) WITH PROPOFOL N/A 12/07/2015   Procedure: ESOPHAGOGASTRODUODENOSCOPY (EGD) WITH PROPOFOL;  Surgeon: Lollie Sails, MD;  Location: [facility] ENDOSCOPY;  Service: Endoscopy;  Laterality: N/A;   ESOPHAGOGASTRODUODENOSCOPY (  EGD) WITH PROPOFOL N/A 05/17/2017   Procedure: ESOPHAGOGASTRODUODENOSCOPY (EGD) WITH PROPOFOL;  Surgeon: Lollie Sails, MD;  Location: [facility] ENDOSCOPY;  Service: Endoscopy;  Laterality: N/A;   HAMMER TOE SURGERY Bilateral 01/06/2015   Procedure: HAMMER TOE CORRECTION;  Surgeon: [name], MD;  Location: [facility] ORS;  Service: Orthopedics;  Laterality: Bilateral;   KNEE CARTILAGE  SURGERY Left 1965   "football injury"   Left Total Knee Arthroplasty     a. 11/2013 [facility].   PACEMAKER IMPLANT  2017   PILONIDAL CYST EXCISION  1970's   TOTAL HIP ARTHROPLASTY Right 2004   TOTAL HIP ARTHROPLASTY Left 2006   UPPER GI ENDOSCOPY      Home Medications:  Allergies as of 09/06/2021   No Known Allergies      Medication List        Accurate as of September 06, 2021  3:58 PM. If you have any questions, ask your nurse or doctor.          acetaminophen 650 MG CR tablet Commonly known as: TYLENOL Take 650 mg by mouth every 8 (eight) hours as needed for pain.   apixaban 5 MG Tabs tablet Commonly known as: Eliquis Take 1 tablet (5 mg total) by mouth 2 (two) times daily.   Cranberry 500 MG Caps Take 500 mg by mouth daily.   Fish Oil 1200 MG Caps Take 1,200 mg by mouth daily.   fludrocortisone 0.1 MG tablet Commonly known as: FLORINEF Take 100 mcg by mouth daily.   gabapentin 600 MG tablet Commonly known as: NEURONTIN TAKE 1 TABLET BY MOUTH EVERYDAY AT BEDTIME   glucose blood test strip Use to check blood sugar 3 times a day and document results, bring to appointments.  Goal is <130 fasting blood sugar and <180 two hours after meals.   HYDROcodone-acetaminophen 5-325 MG tablet Commonly known as: NORCO/VICODIN Take 1 tablet by mouth 2 (two) times daily as needed for moderate pain or severe pain.   LORazepam 1 MG tablet Commonly known as: ATIVAN TAKE 1 TABLET BY MOUTH EVERYDAY AT BEDTIME   Magnesium 400 MG Tabs Take 400 mg by mouth daily.   MELATONIN PO Take by mouth.   midodrine 10 MG tablet Commonly known as: PROAMATINE Take 0.5 tablets (5 mg total) by mouth 3 (three) times daily.   multivitamin with minerals Tabs tablet Take 1 tablet by mouth daily. Centrum Silver   onetouch ultrasoft lancets Use to check blood sugar 3 times a day and document results, bring to appointments.  Goal is <130 fasting blood sugar and <180 two hours after meals.    OneTouch Verio w/Device Kit Use to check blood sugar 3 times a day and document results, bring to appointments.  Goal is <130 fasting blood sugar and <180 two hours after meals.   oxymetazoline 0.05 % nasal spray Commonly known as: AFRIN Place 2 sprays into both nostrils at bedtime.   pantoprazole 20 MG tablet Commonly known as: PROTONIX Take 1 tablet (20 mg total) by mouth 2 (two) times daily before a meal.   potassium chloride SA 20 MEQ tablet Commonly known as: KLOR-CON M Take 2 tablets (40 meq) x 1 dose as directed What changed:  how much to take how to take this when to take this reasons to take this additional instructions   QUEtiapine Fumarate 150 MG 24 hr tablet Commonly known as: SEROQUEL XR TAKE 1 TABLET BY MOUTH AT BEDTIME.   rOPINIRole 0.25 MG tablet Commonly known as: REQUIP Take 0.25  mg by mouth 4 (four) times daily.   rosuvastatin 40 MG tablet Commonly known as: CRESTOR TAKE 1 TABLET BY MOUTH EVERY DAY   solifenacin 10 MG tablet Commonly known as: VESICARE TAKE 1 TABLET BY MOUTH EVERY DAY   tiZANidine 4 MG tablet Commonly known as: Zanaflex Take 1 tablet (4 mg total) by mouth every 6 (six) hours as needed for muscle spasms.   torsemide 20 MG tablet Commonly known as: DEMADEX Take 40 mg by mouth daily as needed (fluid retention).   venlafaxine XR 75 MG 24 hr capsule Commonly known as: Effexor XR Take 3 capsules (225 mg total) by mouth daily.   vitamin C 1000 MG tablet Take 1,000 mg by mouth daily.        Allergies:  No Known Allergies  Family History: Family History  Problem Relation Age of Onset   Alcoholism Mother        died in her 54's.   Cancer Mother    Stroke Father        deceased   Diabetes Father    Hypertension Father    Glaucoma Sister    Breast cancer Sister    Diabetes Daughter    Asthma Son    Stroke Paternal Grandmother    Prostate cancer Paternal Grandfather     Social History:  reports that he has never  smoked. He has never used smokeless tobacco. He reports that he does not currently use alcohol. He reports that he does not use drugs.   Physical Exam: BP 118/74   Ht '5\' 10"'  (1.778 m)   Wt 282 lb (127.9 kg)   BMI 40.46 kg/m   Constitutional:  Alert and oriented, No acute distress.  By wife today. Rectal: Normal sphincter tone.  40 cc prostate, nontender, no nodules HEENT: [place] AT, moist mucus membranes.  Trachea midline, no masses. Cardiovascular: No clubbing, cyanosis, or edema. Respiratory: Normal respiratory effort, no increased work of breathing. Skin: No rashes, bruises or suspicious lesions. Neurologic: Grossly intact, no focal deficits, moving all 4 extremities. Psychiatric: Normal mood and affect.  Laboratory Data: Lab Results  Component Value Date   CREATININE 1.01 08/24/2021   Lab Results  Component Value Date   HGBA1C 5.9 (H) 08/24/2021    Pertinent Imaging: CLINICAL DATA:  Ongoing right lower quadrant abdominal pain for the past year. Loss of appetite.   EXAM: CT ABDOMEN AND PELVIS WITHOUT CONTRAST   TECHNIQUE: Multidetector CT imaging of the abdomen and pelvis was performed following the standard protocol without IV contrast.   RADIATION DOSE REDUCTION: This exam was performed according to the departmental dose-optimization program which includes automated exposure control, adjustment of the mA and/or kV according to patient size and/or use of iterative reconstruction technique.   COMPARISON:  09/20/2020.   FINDINGS: Lower chest: Stable linear atelectasis/scarring at the right lung base. Stable large right diaphragmatic eventration. Intracardiac pacemaker lead artifacts. Atheromatous calcifications, including the coronary arteries and aorta. Small pericardial effusion with a maximum thickness of 1.4 cm.   Hepatobiliary: No focal liver abnormality is seen. No gallstones, gallbladder wall thickening, or biliary dilatation.   Pancreas: Moderate diffuse  atrophy.   Spleen: Normal in size without focal abnormality.   Adrenals/Urinary Tract: Adrenal glands are unremarkable. Kidneys are normal, without renal calculi, focal lesion, or hydronephrosis. Bladder is unremarkable.   Stomach/Bowel: Stomach distended by ingested material and contrast. Unremarkable small bowel, appendix and colon.   Vascular/Lymphatic: Atheromatous arterial calcifications without aneurysm. No enlarged lymph nodes.  Reproductive: Prostate is unremarkable.   Other: Stable small umbilical hernia containing fat.   Musculoskeletal: Bilateral hip prostheses. Lumbar and lower thoracic spine degenerative changes.   IMPRESSION: 1. Stable large right diaphragmatic eventration with associated chronic atelectasis/scarring at the right lung base. 2. Small pericardial effusion. 3.  Calcific coronary artery and aortic atherosclerosis. 4. Previously demonstrated mild right hydronephrosis has resolved. No urinary tract calculi seen at this time. 5. Stable normal appearing appendix.     Electronically Signed   By: Claudie Revering M.D.   On: 04/12/2021 14:04   Assessment & Plan:    1. Benign prostatic hyperplasia without lower urinary tract symptoms Mildly enlarged prostate, otherwise minimal urinary symptoms - PSA; Future  2. Right lower quadrant pain We discussed based on the nature of his pain, location, as well as reassuring CT scan back in March, do not suspect underlying GU pathology as cause  He will follow-up with his primary care physician.  May benefit from consideration of physical therapy.  3. Elevated PSA  We reviewed the implications of an elevated PSA and the uncertainty surrounding it. In general, a man's PSA increases with age and is produced by both normal and cancerous prostate tissue. Differential for elevated PSA is BPH, prostate cancer, infection, recent intercourse/ejaculation, prostate infarction, recent urethroscopic manipulation (foley  placement/cystoscopy) and prostatitis. Management of an elevated PSA can include observation or prostate biopsy and wediscussed this in detail. We discussed that indications for prostate biopsy are defined by age and race specific PSA cutoffs as well as a PSA velocity of 0.75/year.  Rectal exam today was fairly benign  Based on his age and comorbidities, would likely not have recommended PSA screening and thus will manage somewhat cautiously.  In light of elevated PSA, we will plan to repeat this in 6 months.  If it continues to rise, we discussed the option of biopsy versus MRI.  He did mention today that he has a pacemaker which may or may not be compatible.  Follow-up in 6 months with PSA prior    Return in about 6 months (around 03/09/2022).  Conley Rolls as a Education administrator for Hollice Espy, MD.,have documented all relevant documentation on the behalf of Hollice Espy, MD,as directed by  Hollice Espy, MD while in the presence of Hollice Espy, MD.   I have reviewed the above documentation for accuracy and completeness, and I agree with the above.   Hollice Espy, MD   [facility] [address], [place], [place] [address] [phone]

## 2021-09-12 ENCOUNTER — Ambulatory Visit (INDEPENDENT_AMBULATORY_CARE_PROVIDER_SITE_OTHER): Payer: Medicare Other

## 2021-09-12 DIAGNOSIS — I442 Atrioventricular block, complete: Secondary | ICD-10-CM | POA: Diagnosis not present

## 2021-09-13 ENCOUNTER — Other Ambulatory Visit: Payer: Self-pay | Admitting: Nurse Practitioner

## 2021-09-13 DIAGNOSIS — F339 Major depressive disorder, recurrent, unspecified: Secondary | ICD-10-CM

## 2021-09-13 LAB — CUP PACEART REMOTE DEVICE CHECK
Battery Remaining Longevity: 25 mo
Battery Voltage: 2.95 V
Brady Statistic AP VP Percent: 16.21 %
Brady Statistic AP VS Percent: 0 %
Brady Statistic AS VP Percent: 83.76 %
Brady Statistic AS VS Percent: 0.03 %
Brady Statistic RA Percent Paced: 16.16 %
Brady Statistic RV Percent Paced: 99.96 %
Date Time Interrogation Session: 20230821102250
Implantable Lead Implant Date: 20180124
Implantable Lead Implant Date: 20180124
Implantable Lead Location: 753859
Implantable Lead Location: 753860
Implantable Lead Model: 3830
Implantable Lead Model: 5076
Implantable Pulse Generator Implant Date: 20180124
Lead Channel Impedance Value: 323 Ohm
Lead Channel Impedance Value: 342 Ohm
Lead Channel Impedance Value: 361 Ohm
Lead Channel Impedance Value: 456 Ohm
Lead Channel Pacing Threshold Amplitude: 0.5 V
Lead Channel Pacing Threshold Amplitude: 0.75 V
Lead Channel Pacing Threshold Pulse Width: 0.4 ms
Lead Channel Pacing Threshold Pulse Width: 0.4 ms
Lead Channel Sensing Intrinsic Amplitude: 1.625 mV
Lead Channel Sensing Intrinsic Amplitude: 1.625 mV
Lead Channel Sensing Intrinsic Amplitude: 3.125 mV
Lead Channel Sensing Intrinsic Amplitude: 3.125 mV
Lead Channel Setting Pacing Amplitude: 2 V
Lead Channel Setting Pacing Amplitude: 2.25 V
Lead Channel Setting Pacing Pulse Width: 1 ms
Lead Channel Setting Sensing Sensitivity: 0.6 mV

## 2021-09-13 NOTE — Telephone Encounter (Signed)
Refilled 08/11/2020 #[id number] refills. Requested Prescriptions  Pending Prescriptions Disp Refills  . venlafaxine XR (EFFEXOR-XR) 75 MG 24 hr capsule [Pharmacy Med Name: VENLAFAXINE HCL ER 75 MG CAP] 270 capsule 4    Sig: TAKE 3 CAPSULES BY MOUTH DAILY.     Psychiatry: Antidepressants - SNRI - desvenlafaxine & venlafaxine Failed - 09/13/2021  2:01 AM      Failed - Lipid Panel in normal range within the last 12 months    Cholesterol, Total  Date Value Ref Range Status  08/24/2021 129 100 - 199 mg/dL Final   Cholesterol Piccolo, Waived  Date Value Ref Range Status  01/03/2018 198 <200 mg/dL Final    Comment:                            Desirable                <200                         Borderline High      200- 239                         High                     >239    LDL Chol Calc (NIH)  Date Value Ref Range Status  08/24/2021 59 0 - 99 mg/dL Final   HDL  Date Value Ref Range Status  08/24/2021 55 >39 mg/dL Final   Triglycerides  Date Value Ref Range Status  08/24/2021 77 0 - 149 mg/dL Final   Triglycerides Piccolo,Waived  Date Value Ref Range Status  01/03/2018 264 (H) <150 mg/dL Final    Comment:                            Normal                   <150                         Borderline High     150 - 199                         High                200 - 499                         Very High                >499          Passed - Cr in normal range and within 360 days    Creatinine  Date Value Ref Range Status  02/26/2014 0.94 0.60 - 1.30 mg/dL Final   Creatinine, Ser  Date Value Ref Range Status  08/24/2021 1.01 0.76 - 1.27 mg/dL Final         Passed - Completed PHQ-2 or PHQ-9 in the last 360 days      Passed - Last BP in normal range    BP Readings from Last 1 Encounters:  09/06/21 118/74         Passed - Valid encounter within last 6 months    Recent Outpatient  Visits          2 weeks ago Type 2 diabetes mellitus with obesity ([place])   [place] Crozier, Drummond T, NP   3 months ago Acute cough   [place] Herron, Shambaugh T, NP   3 months ago Type 2 diabetes mellitus with obesity ([place])   [place], Jolene T, NP   5 months ago Abdominal pain, chronic, right lower quadrant   [place], Hayden T, NP   6 months ago Type 2 diabetes mellitus with obesity ([place])   [place], Barbaraann Faster, NP      Future Appointments            In 2 months Cannady, Barbaraann Faster, NP [employer], [place]   In 5 months Hollice Espy, MD [place]

## 2021-09-30 ENCOUNTER — Encounter: Payer: Self-pay | Admitting: Nurse Practitioner

## 2021-10-03 ENCOUNTER — Ambulatory Visit: Payer: Medicare Other | Attending: Anesthesiology | Admitting: Anesthesiology

## 2021-10-03 ENCOUNTER — Encounter: Payer: Self-pay | Admitting: Anesthesiology

## 2021-10-03 DIAGNOSIS — F1190 Opioid use, unspecified, uncomplicated: Secondary | ICD-10-CM | POA: Diagnosis not present

## 2021-10-03 DIAGNOSIS — M5136 Other intervertebral disc degeneration, lumbar region: Secondary | ICD-10-CM | POA: Diagnosis not present

## 2021-10-03 DIAGNOSIS — M79642 Pain in left hand: Secondary | ICD-10-CM

## 2021-10-03 DIAGNOSIS — M1611 Unilateral primary osteoarthritis, right hip: Secondary | ICD-10-CM

## 2021-10-03 DIAGNOSIS — M5416 Radiculopathy, lumbar region: Secondary | ICD-10-CM

## 2021-10-03 DIAGNOSIS — M5431 Sciatica, right side: Secondary | ICD-10-CM | POA: Diagnosis not present

## 2021-10-03 DIAGNOSIS — G894 Chronic pain syndrome: Secondary | ICD-10-CM

## 2021-10-03 DIAGNOSIS — M47816 Spondylosis without myelopathy or radiculopathy, lumbar region: Secondary | ICD-10-CM

## 2021-10-03 DIAGNOSIS — M79641 Pain in right hand: Secondary | ICD-10-CM

## 2021-10-03 DIAGNOSIS — M5432 Sciatica, left side: Secondary | ICD-10-CM

## 2021-10-03 MED ORDER — HYDROCODONE-ACETAMINOPHEN 5-325 MG PO TABS: 1.0000 | ORAL_TABLET | Freq: Two times a day (BID) | ORAL | 0 refills | Status: DC

## 2021-10-03 NOTE — Progress Notes (Signed)
Virtual Visit via Telephone Note  I connected with Nicholas [name] on 10/03/21 at  3:00 PM EDT by telephone and verified that I am speaking with the correct person using two identifiers.  Location: Patient: Home Provider: Pain control center   I discussed the limitations, risks, security and privacy concerns of performing an evaluation and management service by telephone and the availability of in person appointments. I also discussed with the patient that there may be a patient responsible charge related to this service. The patient expressed understanding and agreed to proceed.   History of Present Illness: I spoke to Nicholas [name] regarding his low back pain and persistent right hip pain.  He still having a fair amount of low back pain but his primary pain complaint now is his right hip pain.  He reports that he recently saw Dr. Sabra Heck who did a bursa injection which yielded complete resolution of his hip pain for about 48 hours but is having gradual recurrence of the same pain and now may require surgical replacement of his 76 year old hip prosthesis.  His back pain and leg pain are stable in nature he still taking his hydrocodone twice a day as needed for pain relief and this works well for him without side effect.  The quality characteristic and distribution of his low back pain and leg pain are stable in nature with no recent changes reported.  Otherwise he is in his usual state of health trying to stay active and keep his weight off.  Review of systems: General: No fevers or chills Pulmonary: No shortness of breath or dyspnea Cardiac: No angina or palpitations or lightheadedness GI: No abdominal pain or constipation Psych: No depression    Observations/Objective:  Current Outpatient Medications:    [START ON 10/10/2021] HYDROcodone-acetaminophen (NORCO/VICODIN) 5-325 MG tablet, Take 1 tablet by mouth 2 (two) times daily., Disp: 60 tablet, Rfl: 0   [START ON 11/09/2021]  HYDROcodone-acetaminophen (NORCO/VICODIN) 5-325 MG tablet, Take 1 tablet by mouth 2 (two) times daily., Disp: 60 tablet, Rfl: 0   acetaminophen (TYLENOL) 650 MG CR tablet, Take 650 mg by mouth every 8 (eight) hours as needed for pain., Disp: , Rfl:    apixaban (ELIQUIS) 5 MG TABS tablet, Take 1 tablet (5 mg total) by mouth 2 (two) times daily., Disp: 180 tablet, Rfl: 1   Ascorbic Acid (VITAMIN C) 1000 MG tablet, Take 1,000 mg by mouth daily., Disp: , Rfl:    Blood Glucose Monitoring Suppl (ONETOUCH VERIO) w/Device KIT, Use to check blood sugar 3 times a day and document results, bring to appointments.  Goal is <130 fasting blood sugar and <180 two hours after meals., Disp: 1 kit, Rfl: 0   Cranberry 500 MG CAPS, Take 500 mg by mouth daily., Disp: , Rfl:    fludrocortisone (FLORINEF) 0.1 MG tablet, Take 100 mcg by mouth daily., Disp: , Rfl:    gabapentin (NEURONTIN) 600 MG tablet, TAKE 1 TABLET BY MOUTH EVERYDAY AT BEDTIME, Disp: 90 tablet, Rfl: 4   glucose blood test strip, Use to check blood sugar 3 times a day and document results, bring to appointments.  Goal is <130 fasting blood sugar and <180 two hours after meals., Disp: 100 each, Rfl: 12   Lancets (ONETOUCH ULTRASOFT) lancets, Use to check blood sugar 3 times a day and document results, bring to appointments.  Goal is <130 fasting blood sugar and <180 two hours after meals., Disp: 100 each, Rfl: 12   LORazepam (ATIVAN) 1 MG tablet, TAKE  1 TABLET BY MOUTH EVERYDAY AT BEDTIME, Disp: 90 tablet, Rfl: 0   Magnesium 400 MG TABS, Take 400 mg by mouth daily. , Disp: , Rfl:    MELATONIN PO, Take by mouth., Disp: , Rfl:    midodrine (PROAMATINE) 10 MG tablet, Take 0.5 tablets (5 mg total) by mouth 3 (three) times daily., Disp: 180 tablet, Rfl: 2   Multiple Vitamin (MULTIVITAMIN WITH MINERALS) TABS tablet, Take 1 tablet by mouth daily. Centrum Silver, Disp: , Rfl:    Omega-3 Fatty Acids (FISH OIL) 1200 MG CAPS, Take 1,200 mg by mouth daily., Disp: , Rfl:     oxymetazoline (AFRIN) 0.05 % nasal spray, Place 2 sprays into both nostrils at bedtime., Disp: , Rfl:    pantoprazole (PROTONIX) 20 MG tablet, Take 1 tablet (20 mg total) by mouth 2 (two) times daily before a meal., Disp: 180 tablet, Rfl: 4   potassium chloride SA (KLOR-CON) 20 MEQ tablet, Take 2 tablets (40 meq) x 1 dose as directed (Patient taking differently: Take 40 mEq by mouth daily as needed (with torsemide (fluid retention)).), Disp: 10 tablet, Rfl: 0   QUEtiapine Fumarate (SEROQUEL XR) 150 MG 24 hr tablet, TAKE 1 TABLET BY MOUTH AT BEDTIME., Disp: 90 tablet, Rfl: 4   rOPINIRole (REQUIP) 0.25 MG tablet, Take 0.25 mg by mouth 4 (four) times daily., Disp: , Rfl:    rosuvastatin (CRESTOR) 40 MG tablet, TAKE 1 TABLET BY MOUTH EVERY DAY, Disp: 90 tablet, Rfl: 0   solifenacin (VESICARE) 10 MG tablet, TAKE 1 TABLET BY MOUTH EVERY DAY, Disp: 90 tablet, Rfl: 0   tiZANidine (ZANAFLEX) 4 MG tablet, Take 1 tablet (4 mg total) by mouth every 6 (six) hours as needed for muscle spasms., Disp: 90 tablet, Rfl: 4   torsemide (DEMADEX) 20 MG tablet, Take 40 mg by mouth daily as needed (fluid retention)., Disp: , Rfl:    venlafaxine XR (EFFEXOR XR) 75 MG 24 hr capsule, Take 3 capsules (225 mg total) by mouth daily., Disp: 270 capsule, Rfl: 4   Past Medical History:  Diagnosis Date   Abdominal pain, chronic, right lower quadrant 12/13/2020   Anterior urethral stricture    Anxiety    Arthritis    a. knees, hips, hands;  b. 11/2013 s/p L TKA @ [place].   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 Lumbar radiculopathy   2.  Degeneration of lumbar intervertebral disc   3. Bilateral sciatica   4. Chronic, continuous use of opioids   5. Chronic pain syndrome   6. Bilateral hand pain   7. Facet syndrome, lumbar   8. Osteoarthritis of right hip, unspecified osteoarthritis type   Based on our discussion today and after review of the [facility] practitioner database information it is appropriate to refill his medicines for the next 2 months.  No other changes in his pharmacologic regimen will be initiated.  I want him to continue efforts at weight loss and stretching strengthening as tolerated with his current hip condition.  Follow-up in 2 months.  We requested a urine screen for routine purposes and continue follow-up with Dr. Sabra Heck for his hip evaluation and primary care physicians for baseline medical care.  Follow Up Instructions:    I discussed the assessment and treatment plan with the patient. The patient was provided an opportunity to ask questions and all were answered. The patient agreed with the plan and demonstrated an understanding of the instructions.   The patient was advised to call back or seek an in-person evaluation if the symptoms worsen or if the condition fails to improve as anticipated.  I provided 30 minutes of non-face-to-face time during this encounter.   [name], MD

## 2021-10-10 ENCOUNTER — Telehealth: Payer: Self-pay | Admitting: Anesthesiology

## 2021-10-10 NOTE — Progress Notes (Signed)
Remote pacemaker transmission.   

## 2021-10-10 NOTE — Telephone Encounter (Signed)
PT wants to know if doctor can up his prescription. PT stated that he has an prescription that's ready for pick up, however he was going to wait to see if the prescription can be change. Thanks

## 2021-10-11 ENCOUNTER — Telehealth: Payer: Self-pay | Admitting: Internal Medicine

## 2021-10-11 ENCOUNTER — Ambulatory Visit: Payer: Medicare Other | Admitting: Anesthesiology

## 2021-10-11 MED ORDER — APIXABAN 5 MG PO TABS: 5.0000 mg | ORAL_TABLET | Freq: Two times a day (BID) | ORAL | 3 refills | Status: DC

## 2021-10-11 NOTE — Telephone Encounter (Signed)
Pt c/o medication issue:  1. Name of Medication: apixaban (ELIQUIS) 5 MG TABS tablet  2. How are you currently taking this medication (dosage and times per day)? Take 1 tablet (5 mg total) by mouth 2 (two) times daily.  3. Are you having a reaction (difficulty breathing--STAT)? No   4. What is your medication issue? Patient wants to know that due to the cost of medication, if Dr. Caryl Comes would be willing to send prescription over to [place].

## 2021-10-11 NOTE — Telephone Encounter (Signed)
I spoke with Mrs. Nicholas [name]. I have advised her I can provide them with a written RX for Eliquis to send to the [facility] of their choice.  She is aware that Dr. Caryl Comes is out of the office this week, but I can have him sign this next week and call her when it is ready.  Mrs. Nicholas [name] voices understanding and is agreeable.

## 2021-10-18 ENCOUNTER — Ambulatory Visit: Payer: Medicare Other | Attending: Anesthesiology | Admitting: Anesthesiology

## 2021-10-18 ENCOUNTER — Encounter: Payer: Self-pay | Admitting: Anesthesiology

## 2021-10-18 VITALS — BP 164/101 | HR 88 | Temp 97.0°F | Resp 18 | Ht 70.0 in | Wt 288.0 lb

## 2021-10-18 DIAGNOSIS — M79642 Pain in left hand: Secondary | ICD-10-CM | POA: Diagnosis present

## 2021-10-18 DIAGNOSIS — M5432 Sciatica, left side: Secondary | ICD-10-CM | POA: Diagnosis present

## 2021-10-18 DIAGNOSIS — M5136 Other intervertebral disc degeneration, lumbar region: Secondary | ICD-10-CM | POA: Diagnosis present

## 2021-10-18 DIAGNOSIS — G8929 Other chronic pain: Secondary | ICD-10-CM

## 2021-10-18 DIAGNOSIS — M1611 Unilateral primary osteoarthritis, right hip: Secondary | ICD-10-CM | POA: Diagnosis present

## 2021-10-18 DIAGNOSIS — F1190 Opioid use, unspecified, uncomplicated: Secondary | ICD-10-CM | POA: Diagnosis present

## 2021-10-18 DIAGNOSIS — R1031 Right lower quadrant pain: Secondary | ICD-10-CM | POA: Insufficient documentation

## 2021-10-18 DIAGNOSIS — M5431 Sciatica, right side: Secondary | ICD-10-CM

## 2021-10-18 DIAGNOSIS — M47816 Spondylosis without myelopathy or radiculopathy, lumbar region: Secondary | ICD-10-CM | POA: Diagnosis present

## 2021-10-18 DIAGNOSIS — G894 Chronic pain syndrome: Secondary | ICD-10-CM | POA: Diagnosis present

## 2021-10-18 DIAGNOSIS — M51369 Other intervertebral disc degeneration, lumbar region without mention of lumbar back pain or lower extremity pain: Secondary | ICD-10-CM

## 2021-10-18 DIAGNOSIS — M79641 Pain in right hand: Secondary | ICD-10-CM | POA: Diagnosis present

## 2021-10-18 DIAGNOSIS — M62830 Muscle spasm of back: Secondary | ICD-10-CM

## 2021-10-18 DIAGNOSIS — M5416 Radiculopathy, lumbar region: Secondary | ICD-10-CM

## 2021-10-18 MED ORDER — HYDROCODONE-ACETAMINOPHEN 5-325 MG PO TABS: 1.0000 | ORAL_TABLET | Freq: Four times a day (QID) | ORAL | 0 refills | Status: DC | PRN

## 2021-10-18 NOTE — Telephone Encounter (Signed)
Dr. Caryl Comes has signed the patient's RX for Eliquis. I have called the patient's wife, Nicholas [name], and advised her this is ready for pick up.

## 2021-10-18 NOTE — Patient Instructions (Signed)
Prescriptions sent to pharmacy

## 2021-10-18 NOTE — Progress Notes (Unsigned)
Nursing Pain Medication Assessment:  Safety precautions to be maintained throughout the outpatient stay will include: orient to surroundings, keep bed in low position, maintain call bell within reach at all times, provide assistance with transfer out of bed and ambulation.  Medication Inspection Compliance: Pill count conducted under aseptic conditions, in front of the patient. Neither the pills nor the bottle was removed from the patient's sight at any time. Once count was completed pills were immediately returned to the patient in their original bottle.  Medication: Hydrocodone/APAP Pill/Patch Count:  45 of 60 pills remain Pill/Patch Appearance: Markings consistent with prescribed medication Bottle Appearance: Standard pharmacy container. Clearly labeled. Filled Date: 09 / 11 / 2023 Last Medication intake:  Today

## 2021-10-19 ENCOUNTER — Other Ambulatory Visit: Payer: Self-pay

## 2021-10-19 DIAGNOSIS — G894 Chronic pain syndrome: Secondary | ICD-10-CM

## 2021-10-19 NOTE — Progress Notes (Signed)
Subjective:  Patient ID: Nicholas [name], male    DOB: 09-Dec-1945  Age: 76 y.o. MRN: [id number]  CC: Hip Pain (right)   Procedure: None  HPI Nicholas [name] presents for reevaluation.  He was last seen a few months ago and continues to have problems with his low back of a chronic nature.  He is also complaining of a significant amount of hip pain on the right side.  Interestingly, he did have an epidural about the same time he had a right hip bursa injection back in February and notes that that hip pain resolved completely for 2 months.  He had a repeat bursa injection to the right hip not long ago with no significant improvement.  He still takes his medications as prescribed but is breaking through more recently and more frequently with less valuable pain control on his current regimen taking 5 mg of hydrocodone twice a day.  He is also due for a trip to [place] which is a 13-hour car ride and he is concerned that he will be unable to accommodate this with his current dosing.  Furthermore he has been unable to play any golf and is more limited on his activities.  He is not sleeping well at night.  No change in lower extremity strength or function or bowel or bladder function is noted.  The quality of the pain is comparable to what he had in the past but more severe with more right-sided hip pain noted.  Outpatient Medications Prior to Visit  Medication Sig Dispense Refill   acetaminophen (TYLENOL) 650 MG CR tablet Take 650 mg by mouth every 8 (eight) hours as needed for pain.     apixaban (ELIQUIS) 5 MG TABS tablet Take 1 tablet (5 mg total) by mouth 2 (two) times daily. 180 tablet 3   Ascorbic Acid (VITAMIN C) 1000 MG tablet Take 1,000 mg by mouth daily.     Blood Glucose Monitoring Suppl (ONETOUCH VERIO) w/Device KIT Use to check blood sugar 3 times a day and document results, bring to appointments.  Goal is <130 fasting blood sugar and <180 two hours after meals. 1 kit 0   Cranberry 500 MG CAPS  Take 500 mg by mouth daily.     fludrocortisone (FLORINEF) 0.1 MG tablet Take 100 mcg by mouth daily.     gabapentin (NEURONTIN) 600 MG tablet TAKE 1 TABLET BY MOUTH EVERYDAY AT BEDTIME 90 tablet 4   glucose blood test strip Use to check blood sugar 3 times a day and document results, bring to appointments.  Goal is <130 fasting blood sugar and <180 two hours after meals. 100 each 12   [START ON 11/09/2021] HYDROcodone-acetaminophen (NORCO/VICODIN) 5-325 MG tablet Take 1 tablet by mouth 2 (two) times daily. 60 tablet 0   Lancets (ONETOUCH ULTRASOFT) lancets Use to check blood sugar 3 times a day and document results, bring to appointments.  Goal is <130 fasting blood sugar and <180 two hours after meals. 100 each 12   LORazepam (ATIVAN) 1 MG tablet TAKE 1 TABLET BY MOUTH EVERYDAY AT BEDTIME 90 tablet 0   Magnesium 400 MG TABS Take 400 mg by mouth daily.      MELATONIN PO Take by mouth.     midodrine (PROAMATINE) 10 MG tablet Take 0.5 tablets (5 mg total) by mouth 3 (three) times daily. 180 tablet 2   Multiple Vitamin (MULTIVITAMIN WITH MINERALS) TABS tablet Take 1 tablet by mouth daily. Centrum Silver  Omega-3 Fatty Acids (FISH OIL) 1200 MG CAPS Take 1,200 mg by mouth daily.     oxymetazoline (AFRIN) 0.05 % nasal spray Place 2 sprays into both nostrils at bedtime.     pantoprazole (PROTONIX) 20 MG tablet Take 1 tablet (20 mg total) by mouth 2 (two) times daily before a meal. 180 tablet 4   potassium chloride SA (KLOR-CON) 20 MEQ tablet Take 2 tablets (40 meq) x 1 dose as directed (Patient taking differently: Take 40 mEq by mouth daily as needed (with torsemide (fluid retention)).) 10 tablet 0   QUEtiapine Fumarate (SEROQUEL XR) 150 MG 24 hr tablet TAKE 1 TABLET BY MOUTH AT BEDTIME. 90 tablet 4   rOPINIRole (REQUIP) 0.25 MG tablet Take 0.25 mg by mouth 4 (four) times daily.     rosuvastatin (CRESTOR) 40 MG tablet TAKE 1 TABLET BY MOUTH EVERY DAY 90 tablet 0   solifenacin (VESICARE) 10 MG tablet  TAKE 1 TABLET BY MOUTH EVERY DAY 90 tablet 0   tiZANidine (ZANAFLEX) 4 MG tablet Take 1 tablet (4 mg total) by mouth every 6 (six) hours as needed for muscle spasms. 90 tablet 4   torsemide (DEMADEX) 20 MG tablet Take 40 mg by mouth daily as needed (fluid retention).     venlafaxine XR (EFFEXOR XR) 75 MG 24 hr capsule Take 3 capsules (225 mg total) by mouth daily. 270 capsule 4   HYDROcodone-acetaminophen (NORCO/VICODIN) 5-325 MG tablet Take 1 tablet by mouth 2 (two) times daily. 60 tablet 0   No facility-administered medications prior to visit.    Review of Systems CNS: No confusion or sedation Cardiac: No angina or palpitations GI: No abdominal pain or constipation Constitutional: No nausea vomiting fevers or chills  Objective:  BP (!) 164/101   Pulse 88   Temp (!) 97 F (36.1 C)   Resp 18   Ht '5\' 10"'  (1.778 m)   Wt 288 lb (130.6 kg)   SpO2 97%   BMI 41.32 kg/m    BP Readings from Last 3 Encounters:  10/18/21 (!) 164/101  09/06/21 118/74  08/24/21 127/81     Wt Readings from Last 3 Encounters:  10/18/21 288 lb (130.6 kg)  09/06/21 282 lb (127.9 kg)  08/24/21 279 lb 6.4 oz (126.7 kg)     Physical Exam Pt is alert and oriented PERRL EOMI HEART IS RRR no murmur or rub LCTA no wheezing or rales MUSCULOSKELETAL reveals some paraspinous muscle tenderness and a severe antalgic gait with difficulty going from seated to standing.  Muscle tone and bulk appears to be at baseline.  Labs  Lab Results  Component Value Date   HGBA1C 5.9 (H) 08/24/2021   HGBA1C 5.6 05/23/2021   HGBA1C 6.6 (H) 02/21/2021   Lab Results  Component Value Date   MICROALBUR 80 (H) 03/23/2021   LDLCALC 59 08/24/2021   CREATININE 1.01 08/24/2021    -------------------------------------------------------------------------------------------------------------------- Lab Results  Component Value Date   WBC 6.3 03/23/2021   HGB 15.1 03/23/2021   HCT 46.0 03/23/2021   PLT 172 03/23/2021    GLUCOSE 134 (H) 08/24/2021   CHOL 129 08/24/2021   TRIG 77 08/24/2021   HDL 55 08/24/2021   LDLCALC 59 08/24/2021   ALT 36 08/24/2021   AST 32 08/24/2021   NA 145 (H) 08/24/2021   K 4.1 08/24/2021   CL 106 08/24/2021   CREATININE 1.01 08/24/2021   BUN 16 08/24/2021   CO2 22 08/24/2021   TSH 1.420 02/21/2021   INR 1.2 02/16/2014  HGBA1C 5.9 (H) 08/24/2021   MICROALBUR 80 (H) 03/23/2021    --------------------------------------------------------------------------------------------------------------------- [facility] C-ARM 1-60 MIN NO REPORT  Result Date: 07/28/2021 Fluoro was used, but no Radiologist interpretation will be provided. Please refer to "NOTES" tab for provider progress note.    Assessment & Plan:   Nicholas [name] was seen today for hip pain.  Diagnoses and all orders for this visit:  Lumbar radiculopathy  Degeneration of lumbar intervertebral disc  Bilateral sciatica  Chronic, continuous use of opioids  Chronic pain syndrome  Bilateral hand pain  Facet syndrome, lumbar  Osteoarthritis of right hip, unspecified osteoarthritis type  Spasm of muscle of lower back  Abdominal pain, chronic, right lower quadrant  Other orders -     HYDROcodone-acetaminophen (NORCO/VICODIN) 5-325 MG tablet; Take 1 tablet by mouth every 6 (six) hours as needed for moderate pain. -     HYDROcodone-acetaminophen (NORCO/VICODIN) 5-325 MG tablet; Take 1 tablet by mouth every 6 (six) hours as needed for moderate pain or severe pain.        ----------------------------------------------------------------------------------------------------------------------  Problem List Items Addressed This Visit       Unprioritized   Chronic pain syndrome (Chronic)   Relevant Medications   HYDROcodone-acetaminophen (NORCO/VICODIN) 5-325 MG tablet   HYDROcodone-acetaminophen (NORCO/VICODIN) 5-325 MG tablet (Start on 11/17/2021)   Abdominal pain, chronic, right lower quadrant   Relevant  Medications   HYDROcodone-acetaminophen (NORCO/VICODIN) 5-325 MG tablet   HYDROcodone-acetaminophen (NORCO/VICODIN) 5-325 MG tablet (Start on 11/17/2021)   Chronic, continuous use of opioids   Facet syndrome, lumbar   Relevant Medications   HYDROcodone-acetaminophen (NORCO/VICODIN) 5-325 MG tablet   HYDROcodone-acetaminophen (NORCO/VICODIN) 5-325 MG tablet (Start on 11/17/2021)   Lumbar radiculopathy - Primary   Other Visit Diagnoses     Degeneration of lumbar intervertebral disc       Relevant Medications   HYDROcodone-acetaminophen (NORCO/VICODIN) 5-325 MG tablet   HYDROcodone-acetaminophen (NORCO/VICODIN) 5-325 MG tablet (Start on 11/17/2021)   Bilateral sciatica       Bilateral hand pain       Osteoarthritis of right hip, unspecified osteoarthritis type       Relevant Medications   HYDROcodone-acetaminophen (NORCO/VICODIN) 5-325 MG tablet   HYDROcodone-acetaminophen (NORCO/VICODIN) 5-325 MG tablet (Start on 11/17/2021)   Spasm of muscle of lower back             ----------------------------------------------------------------------------------------------------------------------  1. Lumbar radiculopathy At this point I think he would be benefited by a repeat epidural scheduled for the next month or so.  He will need to come off of his Eliquis in advance of this.  We gone over the risk and benefits of the procedure with him in detail.  I am hopeful that this will be beneficial especially that he is failed his most recent hip injection.  2. Degeneration of lumbar intervertebral disc As above  3. Bilateral sciatica   4. Chronic, continuous use of opioids I have reviewed the [facility] practitioner database information and is appropriate for refill.  I am scheduling him to increase his dosing of hydrocodone to 4 tablets/day.  He can use to these of time in the morning or when his pain is most severe during the day.  We gone over the risks and benefits of chronic opioid  therapy.  He shows no sedation with his current dosing and I think his risk profile for dosing increase is minimal.  5. Chronic pain syndrome As above  6. Bilateral hand pain   7. Facet syndrome, lumbar   8. Osteoarthritis  of right hip, unspecified osteoarthritis type Continue core stretching strengthening exercises and continue follow-up with orthopedics regarding his right hip evaluation.  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 Spasm of muscle of lower back   10. Abdominal pain, chronic, right lower quadrant     ----------------------------------------------------------------------------------------------------------------------  I have changed Nicholas T. Wootan "Tommy"'s HYDROcodone-acetaminophen. I am also having him start on HYDROcodone-acetaminophen. Additionally, I am having him maintain his oxymetazoline, Fish Oil, Cranberry, Magnesium, acetaminophen, potassium chloride SA, torsemide, rOPINIRole, vitamin C, multivitamin with minerals, venlafaxine XR, MELATONIN PO, tiZANidine, OneTouch Verio, onetouch ultrasoft, glucose blood, QUEtiapine Fumarate, fludrocortisone, solifenacin, rosuvastatin, gabapentin, LORazepam, midodrine, pantoprazole, HYDROcodone-acetaminophen, and apixaban.   Meds ordered this encounter  Medications   HYDROcodone-acetaminophen (NORCO/VICODIN) 5-325 MG tablet    Sig: Take 1 tablet by mouth every 6 (six) hours as needed for moderate pain.    Dispense:  120 tablet    Refill:  0   HYDROcodone-acetaminophen (NORCO/VICODIN) 5-325 MG tablet    Sig: Take 1 tablet by mouth every 6 (six) hours as needed for moderate pain or severe pain.    Dispense:  120 tablet    Refill:  0   Patient's Medications  New Prescriptions   HYDROCODONE-ACETAMINOPHEN (NORCO/VICODIN) 5-325 MG TABLET    Take 1 tablet by mouth every 6 (six) hours as needed for moderate pain or severe pain.   Previous Medications   ACETAMINOPHEN (TYLENOL) 650 MG CR TABLET    Take 650 mg by mouth every 8 (eight) hours as needed for pain.   APIXABAN (ELIQUIS) 5 MG TABS TABLET    Take 1 tablet (5 mg total) by mouth 2 (two) times daily.   ASCORBIC ACID (VITAMIN C) 1000 MG TABLET    Take 1,000 mg by mouth daily.   BLOOD GLUCOSE MONITORING SUPPL (ONETOUCH VERIO) W/DEVICE KIT    Use to check blood sugar 3 times a day and document results, bring to appointments.  Goal is <130 fasting blood sugar and <180 two hours after meals.   CRANBERRY 500 MG CAPS    Take 500 mg by mouth daily.   FLUDROCORTISONE (FLORINEF) 0.1 MG TABLET    Take 100 mcg by mouth daily.   GABAPENTIN (NEURONTIN) 600 MG TABLET    TAKE 1 TABLET BY MOUTH EVERYDAY AT BEDTIME   GLUCOSE BLOOD TEST STRIP    Use to check blood sugar 3 times a day and document results, bring to appointments.  Goal is <130 fasting blood sugar and <180 two hours after meals.   HYDROCODONE-ACETAMINOPHEN (NORCO/VICODIN) 5-325 MG TABLET    Take 1 tablet by mouth 2 (two) times daily.   LANCETS (ONETOUCH ULTRASOFT) LANCETS    Use to check blood sugar 3 times a day and document results, bring to appointments.  Goal is <130 fasting blood sugar and <180 two hours after meals.   LORAZEPAM (ATIVAN) 1 MG TABLET    TAKE 1 TABLET BY MOUTH EVERYDAY AT BEDTIME   MAGNESIUM 400 MG TABS    Take 400 mg by mouth daily.    MELATONIN PO    Take by mouth.   MIDODRINE (PROAMATINE) 10 MG TABLET    Take 0.5 tablets (5 mg total) by mouth 3 (three) times daily.   MULTIPLE VITAMIN (MULTIVITAMIN WITH MINERALS) TABS TABLET    Take 1 tablet by mouth daily. Centrum Silver   OMEGA-3 FATTY ACIDS (FISH OIL) 1200 MG CAPS  Take 1,200 mg by mouth daily.   OXYMETAZOLINE (AFRIN) 0.05 % NASAL SPRAY    Place 2 sprays into both nostrils at bedtime.   PANTOPRAZOLE (PROTONIX) 20 MG TABLET    Take 1 tablet (20 mg total) by mouth 2 (two) times daily before a meal.   POTASSIUM CHLORIDE SA (KLOR-CON) 20 MEQ TABLET     Take 2 tablets (40 meq) x 1 dose as directed   QUETIAPINE FUMARATE (SEROQUEL XR) 150 MG 24 HR TABLET    TAKE 1 TABLET BY MOUTH AT BEDTIME.   ROPINIROLE (REQUIP) 0.25 MG TABLET    Take 0.25 mg by mouth 4 (four) times daily.   ROSUVASTATIN (CRESTOR) 40 MG TABLET    TAKE 1 TABLET BY MOUTH EVERY DAY   SOLIFENACIN (VESICARE) 10 MG TABLET    TAKE 1 TABLET BY MOUTH EVERY DAY   TIZANIDINE (ZANAFLEX) 4 MG TABLET    Take 1 tablet (4 mg total) by mouth every 6 (six) hours as needed for muscle spasms.   TORSEMIDE (DEMADEX) 20 MG TABLET    Take 40 mg by mouth daily as needed (fluid retention).   VENLAFAXINE XR (EFFEXOR XR) 75 MG 24 HR CAPSULE    Take 3 capsules (225 mg total) by mouth daily.  Modified Medications   Modified Medication Previous Medication   HYDROCODONE-ACETAMINOPHEN (NORCO/VICODIN) 5-325 MG TABLET HYDROcodone-acetaminophen (NORCO/VICODIN) 5-325 MG tablet      Take 1 tablet by mouth every 6 (six) hours as needed for moderate pain.    Take 1 tablet by mouth 2 (two) times daily.  Discontinued Medications   No medications on file   ----------------------------------------------------------------------------------------------------------------------  Follow-up: Return in about 6 weeks (around 11/29/2021) for evaluation, procedure.    [name], MD

## 2021-10-19 NOTE — Progress Notes (Deleted)
Virtual Visit via Telephone Note  I connected with Nicholas [name] on 10/19/21 at 10:40 AM EDT by telephone and verified that I am speaking with the correct person using two identifiers.  Location: Patient: *** Provider: ***   I discussed the limitations, risks, security and privacy concerns of performing an evaluation and management service by telephone and the availability of in person appointments. I also discussed with the patient that there may be a patient responsible charge related to this service. The patient expressed understanding and agreed to proceed.   History of Present Illness:    Observations/Objective:   Assessment and Plan:   Follow Up Instructions:    I discussed the assessment and treatment plan with the patient. The patient was provided an opportunity to ask questions and all were answered. The patient agreed with the plan and demonstrated an understanding of the instructions.   The patient was advised to call back or seek an in-person evaluation if the symptoms worsen or if the condition fails to improve as anticipated.  I provided *** minutes of non-face-to-face time during this encounter.   [name], MD

## 2021-10-19 NOTE — Progress Notes (Unsigned)
tox

## 2021-10-22 LAB — TOXASSURE SELECT 13 (MW), URINE

## 2021-10-23 ENCOUNTER — Other Ambulatory Visit: Payer: Self-pay | Admitting: Nurse Practitioner

## 2021-10-23 DIAGNOSIS — F419 Anxiety disorder, unspecified: Secondary | ICD-10-CM

## 2021-10-24 ENCOUNTER — Ambulatory Visit (INDEPENDENT_AMBULATORY_CARE_PROVIDER_SITE_OTHER): Payer: Medicare Other

## 2021-10-24 ENCOUNTER — Other Ambulatory Visit: Payer: Self-pay | Admitting: Nurse Practitioner

## 2021-10-24 ENCOUNTER — Telehealth: Payer: Self-pay | Admitting: Anesthesiology

## 2021-10-24 DIAGNOSIS — I480 Paroxysmal atrial fibrillation: Secondary | ICD-10-CM

## 2021-10-24 DIAGNOSIS — F419 Anxiety disorder, unspecified: Secondary | ICD-10-CM

## 2021-10-24 DIAGNOSIS — E1169 Type 2 diabetes mellitus with other specified complication: Secondary | ICD-10-CM

## 2021-10-24 MED ORDER — HYDROCODONE-ACETAMINOPHEN 5-325 MG PO TABS: 1.0000 | ORAL_TABLET | Freq: Four times a day (QID) | ORAL | 0 refills | Status: AC | PRN

## 2021-10-24 MED ORDER — LORAZEPAM 1 MG PO TABS: ORAL_TABLET | ORAL | 0 refills | Status: DC

## 2021-10-24 MED ORDER — HYDROCODONE-ACETAMINOPHEN 5-325 MG PO TABS: 1.0000 | ORAL_TABLET | Freq: Four times a day (QID) | ORAL | 0 refills | Status: DC | PRN

## 2021-10-24 NOTE — Progress Notes (Signed)
Chronic Care Management Pharmacy Note  10/24/2021 Name:  Nicholas [name] MRN:  [id number] DOB:  [birth year]  Summary: Pleasant 76 year old male presents for f/u CCM visit. 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 Used to play golf all the time but can't anymore due to knees/hips. Now reads a lot Sherlynn Carbon, [place], [place])  Subjective: Nicholas [name] is an 76 y.o. year old male who is a primary patient of Cannady, Barbaraann Faster, NP.  The CCM team was consulted for assistance with disease management and care coordination needs.    Engaged with patient by telephone for follow up visit in response to provider referral for pharmacy case management and/or care coordination services.   Consent to Services:  The patient was given information about Chronic Care Management services, agreed to services, and gave verbal consent prior to initiation of services.  Please see initial visit note for detailed documentation.   Patient Care Team: Venita Lick, NP as PCP - General (Nurse Practitioner) Vickie Epley, MD as PCP - Electrophysiology (Cardiology) Deboraha Sprang, MD as PCP - Cardiology (Cardiology) [name], MD (Specialist) Hollice Espy, MD as Consulting Physician (Urology) Tanda Rockers, MD as Consulting Physician (Pulmonary Disease) [name], MD as Consulting Physician (Anesthesiology) Deboraha Sprang, MD as Consulting Physician (Cardiology) Greg Cutter, LCSW as Social Worker (Licensed Clinical Social Worker) Lane Hacker [facility] (Pharmacist)  Hospital visits: None in previous 6 months  Objective:  Lab Results  Component Value Date   CREATININE 1.01 08/24/2021   CREATININE 0.91 05/23/2021   CREATININE 0.98 03/23/2021    Lab Results  Component Value Date   HGBA1C 5.9 (H) 08/24/2021   HGBA1C 5.6 05/23/2021   HGBA1C 6.6 (H) 02/21/2021   Last diabetic Eye exam: No results found for: "HMDIABEYEEXA"  Last diabetic Foot exam: No results found  for: "HMDIABFOOTEX"      Component Value Date/Time   CHOL 129 08/24/2021 0840   CHOL 154 02/21/2021 1108   CHOL 140 08/11/2020 0843   CHOL 198 01/03/2018 1610   CHOL 181 08/15/2016 0000   CHOL 178 10/20/2014 1600   TRIG 77 08/24/2021 0840   TRIG 127 02/21/2021 1108   TRIG 118 08/11/2020 0843   TRIG 264 (H) 01/03/2018 1610   TRIG 107 08/15/2016 0000   TRIG 147 10/20/2014 1600   HDL 55 08/24/2021 0840   HDL 65 02/21/2021 1108   HDL 54 08/11/2020 0843   CHOLHDL 3.6 07/16/2018 0813   VLDL 53 (H) 01/03/2018 1610   LDLCALC 59 08/24/2021 0840   LDLCALC 67 02/21/2021 1108   [place] 65 08/11/2020 0843       Latest Ref Rng & Units 08/24/2021    8:40 AM 03/23/2021    9:25 AM 02/21/2021   11:08 AM  Hepatic Function  Total Protein 6.0 - 8.5 g/dL 6.4  7.0  7.1   Albumin 3.8 - 4.8 g/dL 4.3  4.5  4.6   AST 0 - 40 IU/L 32  27  26   ALT 0 - 44 IU/L 36  28  24   Alk Phosphatase 44 - 121 IU/L 55  56  72   Total Bilirubin 0.0 - 1.2 mg/dL 0.6  0.6  0.7     Lab Results  Component Value Date/Time   TSH 1.420 02/21/2021 11:08 AM   TSH 2.180 08/11/2020 08:43 AM       Latest Ref Rng & Units 03/23/2021  9:25 AM 02/21/2021   11:08 AM 11/16/2020    9:14 AM  CBC  WBC 3.4 - 10.8 x10E3/uL 6.3  7.4  5.7   Hemoglobin 13.0 - 17.7 g/dL 15.1  16.0  15.3   Hematocrit 37.5 - 51.0 % 46.0  47.9  46.4   Platelets 150 - 450 x10E3/uL 172  200  170     No results found for: "VD25OH"  Clinical ASCVD:  The ASCVD Risk score (Arnett DK, et al., 2019) failed to calculate for the following reasons:   The valid total cholesterol range is 130 to 320 mg/dL   Social History   Tobacco Use  Smoking Status Never  Smokeless Tobacco Never   BP Readings from Last 3 Encounters:  10/18/21 (!) 164/101  09/06/21 118/74  08/24/21 127/81   Pulse Readings from Last 3 Encounters:  10/18/21 88  08/24/21 90  07/28/21 93   Wt Readings from Last 3 Encounters:  10/18/21 288 lb (130.6 kg)  09/06/21 282 lb (127.9 kg)   08/24/21 279 lb 6.4 oz (126.7 kg)   BMI Readings from Last 3 Encounters:  10/18/21 41.32 kg/m  09/06/21 40.46 kg/m  08/24/21 38.97 kg/m    Assessment: Review of patient past medical history, allergies, medications, health status, including review of consultants reports, laboratory and other test data, was performed as part of comprehensive evaluation and provision of chronic care management services.   SDOH:  (Social Determinants of Health) assessments and interventions performed: Yes SDOH Interventions    Flowsheet Row Chronic Care Management from 10/24/2021 in [place] Visit from 08/24/2021 in [place] from 05/02/2021 in [place] Visit from 02/21/2021 in [place] Visit from 08/11/2020 in [place] Visit from 02/11/2020 in [place] Interventions        Food Insecurity Interventions -- -- Intervention Not Indicated -- -- --  Housing Interventions -- -- Intervention Not Indicated -- -- --  Transportation Interventions Intervention Not Indicated -- Intervention Not Indicated -- -- --  Depression Interventions/Treatment  -- Currently on Treatment Currently on Treatment Medication, Currently on Treatment Currently on Treatment Currently on Treatment  Financial Strain Interventions Other (Comment)  [Make too much for PAP] -- Intervention Not Indicated -- -- --  Physical Activity Interventions -- -- Intervention Not Indicated -- -- --  Stress Interventions -- -- Other (Comment) -- -- --  Social Connections Interventions -- -- Intervention Not Indicated -- -- --       [place]  No Known Allergies  Medications Reviewed Today     Reviewed by Lane Hacker, [facility] (Pharmacist) on 10/24/21 at 0931  Med List Status: <None>   Medication Order Taking? Sig Documenting Provider Last Dose Status Informant  acetaminophen (TYLENOL) 650 MG CR tablet  [id number] No Take 76 mg by mouth every 8 (eight) hours as needed for pain. [provider] Taking Active Spouse/Significant Other  apixaban (ELIQUIS) 5 MG TABS tablet [id number] No Take 1 tablet (5 mg total) by mouth 2 (two) times daily. Deboraha Sprang, MD Taking Active   Ascorbic Acid (VITAMIN C) 1000 MG tablet [id number] No Take 1,000 mg by mouth daily. [provider] Taking Active Spouse/Significant Other  Blood Glucose Monitoring Suppl (ONETOUCH VERIO) w/Device KIT [id number] No Use to check blood sugar 3 times a day and document results, bring to appointments.  Goal is <130 fasting blood sugar and <180 two hours after meals. Cannady, Jolene  T, NP Taking Active   Cranberry 500 MG CAPS [id number] No Take 500 mg by mouth daily. [provider] Taking Active Spouse/Significant Other  fludrocortisone (FLORINEF) 0.1 MG tablet [id number] No Take 100 mcg by mouth daily. [provider] Taking Active   gabapentin (NEURONTIN) 600 MG tablet [id number] No TAKE 1 TABLET BY MOUTH EVERYDAY AT BEDTIME Cannady, Jolene T, NP Taking Active   glucose blood test strip [id number] No Use to check blood sugar 3 times a day and document results, bring to appointments.  Goal is <130 fasting blood sugar and <180 two hours after meals. [name] T, NP Taking Active   HYDROcodone-acetaminophen (NORCO/VICODIN) 5-325 MG tablet [id number] No Take 1 tablet by mouth 2 (two) times daily. [name], MD Taking Active   HYDROcodone-acetaminophen (NORCO/VICODIN) 5-325 MG tablet [id number]  Take 1 tablet by mouth every 6 (six) hours as needed for moderate pain. [name], MD  Active   HYDROcodone-acetaminophen (NORCO/VICODIN) 5-325 MG tablet [id number]  Take 1 tablet by mouth every 6 (six) hours as needed for moderate pain or severe pain. [name], MD  Active   Lancets [facility] ULTRASOFT) lancets [id number] No Use to check blood sugar 3 times a day and document results, bring to  appointments.  Goal is <130 fasting blood sugar and <180 two hours after meals. [name] T, NP Taking Active   LORazepam (ATIVAN) 1 MG tablet [id number] No TAKE 1 TABLET BY MOUTH EVERYDAY AT BEDTIME Cannady, Jolene T, NP Taking Active   Magnesium 400 MG TABS [id number] No Take 400 mg by mouth daily.  [provider] Taking Active Spouse/Significant Other  MELATONIN PO [id number] No Take by mouth. [provider] Taking Active   midodrine (PROAMATINE) 10 MG tablet [id number] No Take 0.5 tablets (5 mg total) by mouth 3 (three) times daily. [name] T, NP Taking Active   Multiple Vitamin (MULTIVITAMIN WITH MINERALS) TABS tablet [id number] No Take 1 tablet by mouth daily. Centrum Silver [provider] Taking Active Spouse/Significant Other  Omega-3 Fatty Acids (FISH OIL) 1200 MG CAPS [id number] No Take 1,200 mg by mouth daily. [provider] Taking Active Spouse/Significant Other  oxymetazoline (AFRIN) 0.05 % nasal spray [id number] No Place 2 sprays into both nostrils at bedtime. [provider] Taking Active Spouse/Significant Other  pantoprazole (PROTONIX) 20 MG tablet [id number] No Take 1 tablet (20 mg total) by mouth 2 (two) times daily before a meal. Cannady, Jolene T, NP Taking Active   potassium chloride SA (KLOR-CON) 20 MEQ tablet [id number] No Take 2 tablets (40 meq) x 1 dose as directed  Patient taking differently: Take 40 mEq by mouth daily as needed (with torsemide (fluid retention)).   Deboraha Sprang, MD Taking Active Spouse/Significant Other  QUEtiapine Fumarate (SEROQUEL XR) 150 MG 24 hr tablet [id number] No TAKE 1 TABLET BY MOUTH AT BEDTIME. [name] T, NP Taking Active   rOPINIRole (REQUIP) 0.25 MG tablet [id number] No Take 0.25 mg by mouth 4 (four) times daily. [provider] Taking Active Spouse/Significant Other  rosuvastatin (CRESTOR) 40 MG tablet [id number] No TAKE 1 TABLET BY MOUTH EVERY DAY Cannady, Jolene T, NP  Taking Active   solifenacin (VESICARE) 10 MG tablet [id number] No TAKE 1 TABLET BY MOUTH EVERY DAY Cannady, Jolene T, NP Taking Active   tiZANidine (ZANAFLEX) 4 MG tablet [id number] No Take 1 tablet (4 mg total) by mouth every 6 (six) hours as needed for muscle spasms. Venita Lick, NP Taking Active  torsemide (DEMADEX) 20 MG tablet [id number] No Take 40 mg by mouth daily as needed (fluid retention). [provider] Taking Active Spouse/Significant Other  venlafaxine XR (EFFEXOR XR) 75 MG 24 hr capsule [id number] No Take 3 capsules (225 mg total) by mouth daily. Venita Lick, NP Taking Active   Med List Note Morley Kos, RN 10/18/21 1107): UDS 10/18/21 MR 12/17/21 When searching in control substance database search exactly this: Emrik Burkitt jr. 03-06-18 Hydrocodone approved 02/03/18-03/05/19.             Patient Active Problem List   Diagnosis Date Noted   Abdominal pain, chronic, right lower quadrant 12/13/2020   S/P ablation of atrial fibrillation 05/17/2020   OSA (obstructive sleep apnea) 04/16/2020   Secondary hypercoagulable state ([place]) 02/07/2020   Abdominal aortic aneurysm (AAA) ([place]) 02/07/2020   Restless leg syndrome 11/10/2019   PAF (paroxysmal atrial fibrillation) ([place]) 10/14/2019   Pacemaker - MDT 10/14/2019   Morbid obesity ([place]) 07/01/2019   Long-term current use of benzodiazepine 03/26/2019   Stage 3a chronic kidney disease ([place]) 03/26/2019   Type 2 diabetes mellitus with obesity ([place]) 03/26/2019   BPH (benign prostatic hyperplasia) 07/11/2018   Chronic, continuous use of opioids 07/05/2018   Chronic pain syndrome 07/05/2018   Facet syndrome, lumbar 07/05/2018   Lumbar radiculopathy 06/05/2017   Complete heart block ([place]) 02/16/2016   History of total knee replacement, bilateral 08/10/2015   Aortic atherosclerosis ([place]) 05/11/2015   Depression, recurrent ([place]) 10/20/2014   Anxiety 10/20/2014   Chronic orthostatic hypotension 04/17/2013    Hypertension associated with diabetes ([place]) 04/17/2013   Hyperlipidemia associated with type 2 diabetes mellitus ([place]) 04/17/2013   GERD (gastroesophageal reflux disease) 04/17/2013   History of pulmonary embolism 04/17/2013   Advanced care planning/counseling discussion 04/14/2013    Immunization History  Administered Date(s) Administered   Fluad Quad(high Dose 65+) 10/22/2020   Influenza Split 11/13/2013   Influenza Whole 11/09/2015   Influenza, High Dose Seasonal PF 11/16/2015, 11/14/2016, 09/27/2017, 09/28/2018, 10/14/2019   Influenza,inj,Quad PF,6+ Mos 10/20/2014   Influenza-Unspecified 09/23/2012, 03/21/2018   Moderna Sars-Covid-2 Vaccination 03/07/2019, 04/07/2019, 12/01/2019   Pneumococcal Conjugate-13 04/28/2014, 09/28/2018   Pneumococcal Polysaccharide-23 04/10/2013, 10/14/2019   Pneumococcal-Unspecified 10/01/2008   Td 01/05/2005   Tdap 06/11/2017   Zoster Recombinat (Shingrix) 09/09/2021    Conditions to be addressed/monitored: HLD HTN DM CKD MDD  Care Plan : ccm pharmacy care plan  Updates made by Lane Hacker, [place] since 10/24/2021 12:00 AM     Problem: afid hld htn gad mdd aaa   Priority: High     Long-Range Goal: disease management   Start Date: 05/09/2021  Expected End Date: 10/25/2021  Recent Progress: On track  Priority: High  Note:    Current Barriers:  Unable to independently afford treatment regimen  Pharmacist Clinical Goal(s):  Patient will verbalize ability to afford treatment regimen achieve adherence to monitoring guidelines and medication adherence to achieve therapeutic efficacy through collaboration with PharmD and provider.   Interventions: 1:1 collaboration with Venita Lick, NP regarding development and update of comprehensive plan of care as evidenced by provider attestation and co-signature Inter-disciplinary care team collaboration (see longitudinal plan of care) Comprehensive medication review performed; medication list  updated in electronic medical record  Diabetes (A1c goal <7%) -Controlled -Had been doing well with ozempic, really helped with appetite but stopped d/t cost concerns. Trulcity copay was not better than ozempic. Might willing to start back on ozempic and accept the cost.  -Current medications:  Trulicity - (active but not currently taking. Rx Copay worse than Ozempic)  -Medications previously tried: none noted  -Current home glucose readings:  fasting glucose: 150s without GLP1, previously less than goal of 130.  post prandial glucose: n/a -Denies hypoglycemic/hyperglycemic symptoms -Current meal patterns: high carb diet, aware of which food might be high in carbs. Struggling with increased appepite off of ozempic.  -Educated on A1c and blood sugar goals; Benefits of weight loss; -Counseled to check feet daily and get yearly eye exams -Recommended to continue current medication Assessed patient finances. Does not qualify for patient assistance. Plan to have patient start back on ozempic and hopefully be able to manage financially    Hyperlipidemia: (LDL goal < 70) The ASCVD Risk score (Arnett DK, et al., 2019) failed to calculate for the following reasons:   The valid total cholesterol range is 130 to 320 mg/dL Lab Results  Component Value Date   CHOL 129 08/24/2021   CHOL 154 02/21/2021   CHOL 140 08/11/2020   Lab Results  Component Value Date   HDL 55 08/24/2021   HDL 65 02/21/2021   HDL 54 08/11/2020   Lab Results  Component Value Date   LDLCALC 59 08/24/2021   LDLCALC 67 02/21/2021   LDLCALC 65 08/11/2020   Lab Results  Component Value Date   TRIG 77 08/24/2021   TRIG 127 02/21/2021   TRIG 118 08/11/2020   Lab Results  Component Value Date   CHOLHDL 3.6 07/16/2018   CHOLHDL 2.9 07/02/2017   CHOLHDL 3.8 03/23/2016  No results found for: "LDLDIRECT" Last vitamin D No results found for: "25OHVITD2", "25OHVITD3", "VD25OH" Lab Results  Component Value Date   TSH  1.420 02/21/2021  -Controlled -BMI ~40, CKD, DM, HTN -Current treatment: Rosuvastatin 40 mg once daily Appropriate, Effective, Safe, Accessible -Educated on Cholesterol goals;  Benefits of statin for ASCVD risk reduction; -Recommended to continue current medication  Atrial Fibrillation (Goal: prevent stroke and major bleeding) -Controlled -s/p ablation 2022 -Current treatment: Rate control: n/a Anticoagulation: Eliquis 5 mg twice daily  -Medications previously tried: flecanide -Home BP and HR readings: none provded  -Counseled on increased risk of stroke due to Afib and benefits of anticoagulation for stroke prevention; 04/2021. Reviewed finances - would not qualify for patient assistance due to annual income.  -Recommended to continue current medication    Diabetes (A1c goal <7%) Lab Results  Component Value Date   HGBA1C 5.9 (H) 08/24/2021   HGBA1C 5.6 05/23/2021   HGBA1C 6.6 (H) 02/21/2021   Lab Results  Component Value Date   MICROALBUR 80 (H) 03/23/2021   LDLCALC 59 08/24/2021   CREATININE 1.01 08/24/2021   Lab Results  Component Value Date   NA 145 (H) 08/24/2021   K 4.1 08/24/2021   CREATININE 1.01 08/24/2021   EGFR 77 08/24/2021   GFRNONAA >60 04/08/2020   GLUCOSE 134 (H) 08/24/2021   Lab Results  Component Value Date   WBC 6.3 03/23/2021   HGB 15.1 03/23/2021   HCT 46.0 03/23/2021   MCV 94 03/23/2021   PLT 172 03/23/2021   Lab Results  Component Value Date   LABMICR See below: 08/24/2021   LABMICR See below: 03/23/2021   MICROALBUR 80 (H) 03/23/2021   MICROALBUR 150 (H) 08/11/2020  -Controlled -Current medications: None -Medications previously tried: Ozempic, trulicity  -Current home glucose readings fasting glucose:  October 2023:  Was in the shower, didn't have readings on him -Denies hypoglycemic/hyperglycemic symptoms -Current exercise: None due to hip -Educated  on A1c and blood sugar goals; -Counseled to check feet daily and get  yearly eye exams -Counseled on diet and exercise extensively  Depression/Anxiety -Not ideally controlled -Current treatment: Venlafaxine XR 277m QD Appropriate, Effective, Safe, Accessible Quetiapine 1542mAppropriate, Effective, Safe, Accessible Lorazepam 11m33muery Appropriate,  -Medications previously tried/failed: N/A -PHQ9:     10/18/2021   10:39 AM 08/24/2021    8:38 AM 05/23/2021    9:05 AM  Depression screen PHQ 2/9  Decreased Interest 0 2 3  Down, Depressed, Hopeless 0 3 3  PHQ - 2 Score 0 5 6  Altered sleeping  3 0  Tired, decreased energy  3 2  Change in appetite  2 2  Feeling bad or failure about yourself   3 2  Trouble concentrating  1 0  Moving slowly or fidgety/restless  0 0  Suicidal thoughts  3 3  PHQ-9 Score  20 15  -GAD7:     08/24/2021    8:38 AM 05/23/2021    9:05 AM 02/21/2021   11:10 AM 05/12/2020    9:58 AM  GAD 7 : Generalized Anxiety Score  Nervous, Anxious, on Edge 3 0 0 0  Control/stop worrying 3 3 0 1  Worry too much - different things 3 3 0 1  Trouble relaxing 3 1 0 0  Restless 3 0 0 0  Easily annoyed or irritable 3 2 0 0  Afraid - awful might happen 3 2 0 0  Total GAD 7 Score 21 11 0 2  Anxiety Difficulty Extremely difficult Somewhat difficult Not difficult at all Not difficult at all  -Educated on Benefits of medication for symptom control October 2023: Per PCP note and patient, he refuses to get off Lorazepam.  Chronic Pain -Not ideally controlled -Current treatment: Hydrocodone 5/325 QID Appropriate, Effective, Safe, Accessible -Medications previously tried: N/A  -Pain Scale  October 2023: There were no vitals filed for this visit.  Aggravating Factors: Movement  Pain Type: musculoskepetal  October 2023: Patient didn't state numeric Pain Scale. When asked, he said he had gotten a shot in his right hip months ago that helped for about 2 months. He got another shot recently and it helped for 2 days. Is going back to doctor Friday to  possibly have right hip redone (Has had both knees and both hips repaired). -Denied constipation   Patient Goals/Self-Care Activities Patient will:  - take medications as prescribed as evidenced by patient report and record review  Follow Up Plan: The patient has been provided with contact information for the care management team and has been advised to call with any health related questions or concerns.   NatArizona Constableharm.D. - [phone]       Patient Goals/Self-Care Activities Patient will:  - take medications as prescribed as evidenced by patient report and record review target a minimum of 150 minutes of moderate intensity exercise weekly  Medication Assistance: None required.  Patient affirms current coverage meets needs.  [facility]  [id number] [address] [place]one: [phone]: [phone]/pharmacy #[id number], [place] [address]: [phone]: [phone] Up:  Patient agrees to Care Plan and Follow-up. Plan: F/U PRN  Future Appointments  Date Time Provider DepaCocke/07/2021  8:20 AM  [name] T, NP [facility]  12/12/2021 11:05 AM [facility] DEVICE REMOTES [facility]  03/03/2022  2:00 PM B[facility]-LAB [facility] None  03/07/2022 10:45 AM Hollice Espy, MD [facility] None  03/13/2022 11:05 AM [facility] DEVICE REMOTES [facility]  06/12/2022 11:05 AM [facility] DEVICE REMOTES [facility]  09/11/2022 11:05 AM [facility] DEVICE REMOTES [facility]   [place] Constable, Pharm.D. - [phone]

## 2021-10-24 NOTE — Patient Instructions (Signed)
Visit Information   Goals Addressed   None    Patient Care Plan: ccm pharmacy care plan     Problem Identified: afid hld htn gad mdd aaa   Priority: High     Long-Range Goal: disease management   Start Date: 05/09/2021  Expected End Date: 10/25/2021  Recent Progress: On track  Priority: High  Note:    Current Barriers:  Unable to independently afford treatment regimen  Pharmacist Clinical Goal(s):  Patient will verbalize ability to afford treatment regimen achieve adherence to monitoring guidelines and medication adherence to achieve therapeutic efficacy through collaboration with PharmD and provider.   Interventions: 1:1 collaboration with Venita Lick, NP regarding development and update of comprehensive plan of care as evidenced by provider attestation and co-signature Inter-disciplinary care team collaboration (see longitudinal plan of care) Comprehensive medication review performed; medication list updated in electronic medical record  Diabetes (A1c goal <7%) -Controlled -Had been doing well with ozempic, really helped with appetite but stopped d/t cost concerns. Trulcity copay was not better than ozempic. Might willing to start back on ozempic and accept the cost.  -Current medications: Trulicity - (active but not currently taking. Rx Copay worse than Ozempic)  -Medications previously tried: none noted  -Current home glucose readings:  fasting glucose: 150s without GLP1, previously less than goal of 130.  post prandial glucose: n/a -Denies hypoglycemic/hyperglycemic symptoms -Current meal patterns: high carb diet, aware of which food might be high in carbs. Struggling with increased appepite off of ozempic.  -Educated on A1c and blood sugar goals; Benefits of weight loss; -Counseled to check feet daily and get yearly eye exams -Recommended to continue current medication Assessed patient finances. Does not qualify for patient assistance. Plan to have patient  start back on ozempic and hopefully be able to manage financially    Hyperlipidemia: (LDL goal < 70) The ASCVD Risk score (Arnett DK, et al., 2019) failed to calculate for the following reasons:   The valid total cholesterol range is 130 to 320 mg/dL Lab Results  Component Value Date   CHOL 129 08/24/2021   CHOL 154 02/21/2021   CHOL 140 08/11/2020   Lab Results  Component Value Date   HDL 55 08/24/2021   HDL 65 02/21/2021   HDL 54 08/11/2020   Lab Results  Component Value Date   LDLCALC 59 08/24/2021   LDLCALC 67 02/21/2021   LDLCALC 65 08/11/2020   Lab Results  Component Value Date   TRIG 77 08/24/2021   TRIG 127 02/21/2021   TRIG 118 08/11/2020   Lab Results  Component Value Date   CHOLHDL 3.6 07/16/2018   CHOLHDL 2.9 07/02/2017   CHOLHDL 3.8 03/23/2016  No results found for: "LDLDIRECT" Last vitamin D No results found for: "25OHVITD2", "25OHVITD3", "VD25OH" Lab Results  Component Value Date   TSH 1.420 02/21/2021  -Controlled -BMI ~40, CKD, DM, HTN -Current treatment: Rosuvastatin 40 mg once daily Appropriate, Effective, Safe, Accessible -Educated on Cholesterol goals;  Benefits of statin for ASCVD risk reduction; -Recommended to continue current medication  Atrial Fibrillation (Goal: prevent stroke and major bleeding) -Controlled -s/p ablation 2022 -Current treatment: Rate control: n/a Anticoagulation: Eliquis 5 mg twice daily  -Medications previously tried: flecanide -Home BP and HR readings: none provded  -Counseled on increased risk of stroke due to Afib and benefits of anticoagulation for stroke prevention; 04/2021. Reviewed finances - would not qualify for patient assistance due to annual income.  -Recommended to continue current medication    Diabetes (A1c  goal <7%) Lab Results  Component Value Date   HGBA1C 5.9 (H) 08/24/2021   HGBA1C 5.6 05/23/2021   HGBA1C 6.6 (H) 02/21/2021   Lab Results  Component Value Date   MICROALBUR 80 (H)  03/23/2021   LDLCALC 59 08/24/2021   CREATININE 1.01 08/24/2021   Lab Results  Component Value Date   NA 145 (H) 08/24/2021   K 4.1 08/24/2021   CREATININE 1.01 08/24/2021   EGFR 77 08/24/2021   GFRNONAA >60 04/08/2020   GLUCOSE 134 (H) 08/24/2021   Lab Results  Component Value Date   WBC 6.3 03/23/2021   HGB 15.1 03/23/2021   HCT 46.0 03/23/2021   MCV 94 03/23/2021   PLT 172 03/23/2021   Lab Results  Component Value Date   LABMICR See below: 08/24/2021   LABMICR See below: 03/23/2021   MICROALBUR 80 (H) 03/23/2021   MICROALBUR 150 (H) 08/11/2020  -Controlled -Current medications: None -Medications previously tried: Ozempic, trulicity  -Current home glucose readings fasting glucose:  October 2023:  Was in the shower, didn't have readings on him -Denies hypoglycemic/hyperglycemic symptoms -Current exercise: None due to hip -Educated on A1c and blood sugar goals; -Counseled to check feet daily and get yearly eye exams -Counseled on diet and exercise extensively  Depression/Anxiety -Not ideally controlled -Current treatment: Venlafaxine XR 270m QD Appropriate, Effective, Safe, Accessible Quetiapine 1582mAppropriate, Effective, Safe, Accessible Lorazepam 59m46muery Appropriate,  -Medications previously tried/failed: N/A -PHQ9:     10/18/2021   10:39 AM 08/24/2021    8:38 AM 05/23/2021    9:05 AM  Depression screen PHQ 2/9  Decreased Interest 0 2 3  Down, Depressed, Hopeless 0 3 3  PHQ - 2 Score 0 5 6  Altered sleeping  3 0  Tired, decreased energy  3 2  Change in appetite  2 2  Feeling bad or failure about yourself   3 2  Trouble concentrating  1 0  Moving slowly or fidgety/restless  0 0  Suicidal thoughts  3 3  PHQ-9 Score  20 15  -GAD7:     08/24/2021    8:38 AM 05/23/2021    9:05 AM 02/21/2021   11:10 AM 05/12/2020    9:58 AM  GAD 7 : Generalized Anxiety Score  Nervous, Anxious, on Edge 3 0 0 0  Control/stop worrying 3 3 0 1  Worry too much - different  things 3 3 0 1  Trouble relaxing 3 1 0 0  Restless 3 0 0 0  Easily annoyed or irritable 3 2 0 0  Afraid - awful might happen 3 2 0 0  Total GAD 7 Score 21 11 0 2  Anxiety Difficulty Extremely difficult Somewhat difficult Not difficult at all Not difficult at all  -Educated on Benefits of medication for symptom control October 2023: Per PCP note and patient, he refuses to get off Lorazepam.  Chronic Pain -Not ideally controlled -Current treatment: Hydrocodone 5/325 QID Appropriate, Effective, Safe, Accessible -Medications previously tried: N/A  -Pain Scale  October 2023: There were no vitals filed for this visit.  Aggravating Factors: Movement  Pain Type: musculoskepetal  October 2023: Patient didn't state numeric Pain Scale. When asked, he said he had gotten a shot in his right hip months ago that helped for about 2 months. He got another shot recently and it helped for 2 days. Is going back to doctor Friday to possibly have right hip redone (Has had both knees and both hips repaired). -Denied constipation  Patient Goals/Self-Care Activities Patient will:  - take medications as prescribed as evidenced by patient report and record review  Follow Up Plan: The patient has been provided with contact information for the care management team and has been advised to call with any health related questions or concerns.   [place] Constable, Pharm.D. - [phone]       Mr. Gosline was given information about Chronic Care Management services today including:  CCM service includes personalized support from designated clinical staff supervised by his physician, including individualized plan of care and coordination with other care providers 24/7 contact phone numbers for assistance for urgent and routine care needs. Standard insurance, coinsurance, copays and deductibles apply for chronic care management only during months in which we provide at least 20 minutes of these services. Most  insurances cover these services at 100%, however patients may be responsible for any copay, coinsurance and/or deductible if applicable. This service may help you avoid the need for more expensive face-to-face services. Only one practitioner may furnish and bill the service in a calendar month. The patient may stop CCM services at any time (effective at the end of the month) by phone call to the office staff.  Patient agreed to services and verbal consent obtained.   The patient verbalized understanding of instructions, educational materials, and care plan provided today and DECLINED offer to receive copy of patient instructions, educational materials, and care plan.  The pharmacy team will reach out to the patient again over the next 60 days.   Lane Hacker, [place]

## 2021-10-24 NOTE — Telephone Encounter (Signed)
PT stated that the phamacy couldn't understand doctor orders for patient prescription. So pharmacy couldn't fill patient prescription. Pt asked if doctor Andree Elk can resend prescription in to pharmacy. Please give patient a call. Thanks

## 2021-10-24 NOTE — Telephone Encounter (Signed)
Requested medication (s) are due for refill today: no  Requested medication (s) are on the active medication list: yes  Last refill:  10/24/21 #90  Future visit scheduled: yes  Notes to clinic:  [facility]   Requested Prescriptions  Pending Prescriptions Disp Refills   LORazepam (ATIVAN) 1 MG tablet [Pharmacy Med Name: LORAZEPAM 1 MG TABLET] 90 tablet 0    Sig: TAKE 1 TABLET BY MOUTH EVERYDAY AT BEDTIME     Not Delegated - Psychiatry: Anxiolytics/Hypnotics 2 Failed - 10/23/2021 12:27 PM      Failed - This refill cannot be delegated      Failed - Urine Drug Screen completed in last 360 days      Passed - Patient is not pregnant      Passed - Valid encounter within last 6 months    Recent Outpatient Visits           2 months ago Type 2 diabetes mellitus with obesity ([place])   [place], [place] T, NP   4 months ago Acute cough   [place], [place] T, NP   5 months ago Type 2 diabetes mellitus with obesity ([place])   [place], Jolene T, NP   7 months ago Abdominal pain, chronic, right lower quadrant   [place], Jolene T, NP   8 months ago Type 2 diabetes mellitus with obesity ([place])   [place], Barbaraann Faster, NP       Future Appointments             In 1 month Cannady, Barbaraann Faster, NP [employer], [place]   In 4 months Hollice Espy, MD [place]

## 2021-10-24 NOTE — Addendum Note (Signed)
Addended by: [name] on: 10/24/2021 02:54 PM   Modules accepted: Orders

## 2021-10-24 NOTE — Telephone Encounter (Signed)
Spoke with [facility], new script for Hydrocodone may be filled on 10-26-21. Patient notified.

## 2021-10-24 NOTE — Telephone Encounter (Signed)
Prescriptions very confusing. Will ask Dr. Andree Elk later today.

## 2021-10-25 ENCOUNTER — Other Ambulatory Visit: Payer: Self-pay | Admitting: Nurse Practitioner

## 2021-10-25 DIAGNOSIS — F419 Anxiety disorder, unspecified: Secondary | ICD-10-CM

## 2021-10-25 NOTE — Telephone Encounter (Signed)
Pt is calling requesting an update on medication refill.  Pt is completely out of the medication for about two days.  Please advise.

## 2021-10-26 NOTE — Telephone Encounter (Signed)
  Notes to clinic:  This is filled at [facility], pt notified. It is not delegated so I have to send back to you but it is completed.      Requested Prescriptions  Pending Prescriptions Disp Refills   LORazepam (ATIVAN) 1 MG tablet [Pharmacy Med Name: LORAZEPAM 1 MG TABLET] 90 tablet 0    Sig: TAKE 1 TABLET BY MOUTH EVERYDAY AT BEDTIME     Not Delegated - Psychiatry: Anxiolytics/Hypnotics 2 Failed - 10/25/2021  2:50 PM      Failed - This refill cannot be delegated      Failed - Urine Drug Screen completed in last 360 days      Passed - Patient is not pregnant      Passed - Valid encounter within last 6 months    Recent Outpatient Visits           2 months ago Type 2 diabetes mellitus with obesity ([place])   [place], [place] T, NP   4 months ago Acute cough   [place], [place] T, NP   5 months ago Type 2 diabetes mellitus with obesity ([place])   [place], Jolene T, NP   7 months ago Abdominal pain, chronic, right lower quadrant   [place], Jolene T, NP   8 months ago Type 2 diabetes mellitus with obesity ([place])   [place], Barbaraann Faster, NP       Future Appointments             In 1 month Cannady, Barbaraann Faster, NP [employer], [place]   In 4 months Hollice Espy, MD [place]

## 2021-10-26 NOTE — Telephone Encounter (Signed)
Requested medication (s) are due for refill today:   Provider to review  Requested medication (s) are on the active medication list:   Yes  Future visit scheduled:   Yes in 1 mo.   Last ordered: 10/24/2021 #90, 0 refills  Non delegated refill reason returned   Requested Prescriptions  Pending Prescriptions Disp Refills   LORazepam (ATIVAN) 1 MG tablet [Pharmacy Med Name: LORAZEPAM 1 MG TABLET] 90 tablet 0    Sig: TAKE 1 TABLET BY MOUTH EVERYDAY AT BEDTIME     Not Delegated - Psychiatry: Anxiolytics/Hypnotics 2 Failed - 10/25/2021  2:50 PM      Failed - This refill cannot be delegated      Failed - Urine Drug Screen completed in last 360 days      Passed - Patient is not pregnant      Passed - Valid encounter within last 6 months    Recent Outpatient Visits           2 months ago Type 2 diabetes mellitus with obesity ([place])   [place], [place] T, NP   4 months ago Acute cough   [place], [place] T, NP   5 months ago Type 2 diabetes mellitus with obesity ([place])   [place], Jolene T, NP   7 months ago Abdominal pain, chronic, right lower quadrant   [place], Jolene T, NP   8 months ago Type 2 diabetes mellitus with obesity ([place])   [place], Barbaraann Faster, NP       Future Appointments             In 1 month Cannady, Barbaraann Faster, NP [employer], [place]   In 4 months Hollice Espy, MD [place]

## 2021-10-28 ENCOUNTER — Other Ambulatory Visit: Payer: Self-pay | Admitting: Orthopedic Surgery

## 2021-10-28 ENCOUNTER — Other Ambulatory Visit (HOSPITAL_COMMUNITY): Payer: Self-pay | Admitting: Orthopedic Surgery

## 2021-10-28 ENCOUNTER — Ambulatory Visit
Admission: RE | Admit: 2021-10-28 | Discharge: 2021-10-28 | Disposition: A | Discharge status: Home / Self Care | Payer: Medicare Other | Source: Ambulatory Visit | Attending: Orthopedic Surgery | Admitting: Orthopedic Surgery

## 2021-10-28 DIAGNOSIS — Z96641 Presence of right artificial hip joint: Secondary | ICD-10-CM

## 2021-10-28 DIAGNOSIS — R2241 Localized swelling, mass and lump, right lower limb: Secondary | ICD-10-CM | POA: Diagnosis present

## 2021-10-30 ENCOUNTER — Other Ambulatory Visit: Payer: Self-pay | Admitting: Nurse Practitioner

## 2021-10-30 DIAGNOSIS — F339 Major depressive disorder, recurrent, unspecified: Secondary | ICD-10-CM

## 2021-10-31 NOTE — Telephone Encounter (Signed)
Requested Prescriptions  Pending Prescriptions Disp Refills  . venlafaxine XR (EFFEXOR-XR) 75 MG 24 hr capsule [Pharmacy Med Name: VENLAFAXINE HCL ER 75 MG CAP] 270 capsule 1    Sig: TAKE 3 CAPSULES BY MOUTH DAILY.     Psychiatry: Antidepressants - SNRI - desvenlafaxine & venlafaxine Failed - 10/30/2021  2:26 PM      Failed - Last BP in normal range    BP Readings from Last 1 Encounters:  10/18/21 (!) 164/101         Failed - Lipid Panel in normal range within the last 12 months    Cholesterol, Total  Date Value Ref Range Status  08/24/2021 129 100 - 199 mg/dL Final   Cholesterol Piccolo, [place]  Date Value Ref Range Status  01/03/2018 198 <200 mg/dL Final    Comment:                            Desirable                <200                         Borderline High      200- 239                         High                     >239    LDL Chol Calc (NIH)  Date Value Ref Range Status  08/24/2021 59 0 - 99 mg/dL Final   HDL  Date Value Ref Range Status  08/24/2021 55 >39 mg/dL Final   Triglycerides  Date Value Ref Range Status  08/24/2021 77 0 - 149 mg/dL Final   Triglycerides Piccolo,Waived  Date Value Ref Range Status  01/03/2018 264 (H) <150 mg/dL Final    Comment:                            Normal                   <150                         Borderline High     150 - 199                         High                200 - 499                         Very High                >499          Passed - Cr in normal range and within 360 days    Creatinine  Date Value Ref Range Status  02/26/2014 0.94 0.60 - 1.30 mg/dL Final   Creatinine, Ser  Date Value Ref Range Status  08/24/2021 1.01 0.76 - 1.27 mg/dL Final         Passed - Completed PHQ-2 or PHQ-9 in the last 360 days      Passed - Valid encounter within last 6 months    Recent Outpatient Visits  2 months ago Type 2 diabetes mellitus with obesity ([place])   [place], [place] T,  NP   5 months ago Acute cough   [place], [place] T, NP   5 months ago Type 2 diabetes mellitus with obesity ([place])   [place], Jolene T, NP   7 months ago Abdominal pain, chronic, right lower quadrant   [place], [place] T, NP   8 months ago Type 2 diabetes mellitus with obesity ([place])   [place], Barbaraann Faster, NP      Future Appointments            In 4 weeks Cannady, Barbaraann Faster, NP [employer], [place]   In 4 months Hollice Espy, MD [place]

## 2021-11-01 ENCOUNTER — Other Ambulatory Visit: Payer: Medicare Other

## 2021-11-04 ENCOUNTER — Other Ambulatory Visit: Payer: Self-pay | Admitting: Nurse Practitioner

## 2021-11-04 NOTE — Telephone Encounter (Signed)
Requested Prescriptions  Pending Prescriptions Disp Refills  . rosuvastatin (CRESTOR) 40 MG tablet [Pharmacy Med Name: ROSUVASTATIN CALCIUM 40 MG TAB] 90 tablet 0    Sig: TAKE 1 TABLET BY MOUTH EVERY DAY     Cardiovascular:  Antilipid - Statins 2 Failed - 11/04/2021  1:35 AM      Failed - Lipid Panel in normal range within the last 12 months    Cholesterol, Total  Date Value Ref Range Status  08/24/2021 129 100 - 199 mg/dL Final   Cholesterol Piccolo, Waived  Date Value Ref Range Status  01/03/2018 198 <200 mg/dL Final    Comment:                            Desirable                <200                         Borderline High      200- 239                         High                     >239    LDL Chol Calc (NIH)  Date Value Ref Range Status  08/24/2021 59 0 - 99 mg/dL Final   HDL  Date Value Ref Range Status  08/24/2021 55 >39 mg/dL Final   Triglycerides  Date Value Ref Range Status  08/24/2021 77 0 - 149 mg/dL Final   Triglycerides Piccolo,Waived  Date Value Ref Range Status  01/03/2018 264 (H) <150 mg/dL Final    Comment:                            Normal                   <150                         Borderline High     150 - 199                         High                200 - 499                         Very High                >499          Passed - Cr in normal range and within 360 days    Creatinine  Date Value Ref Range Status  02/26/2014 0.94 0.60 - 1.30 mg/dL Final   Creatinine, Ser  Date Value Ref Range Status  08/24/2021 1.01 0.76 - 1.27 mg/dL Final         Passed - Patient is not pregnant      Passed - Valid encounter within last 12 months    Recent Outpatient Visits          2 months ago Type 2 diabetes mellitus with obesity ([place])   [place], [place] T, NP   5 months ago Acute cough   [place],  Jolene T, NP   5 months ago Type 2 diabetes mellitus with obesity ([place])   [place], [place] T, NP   7 months ago Abdominal pain, chronic, right lower quadrant   [place], [place] T, NP   8 months ago Type 2 diabetes mellitus with obesity ([place])   [place], Barbaraann Faster, NP      Future Appointments            In 3 weeks Cannady, Barbaraann Faster, NP [employer], [place]   In 4 months Hollice Espy, MD [place]

## 2021-11-10 ENCOUNTER — Other Ambulatory Visit: Payer: Self-pay | Admitting: Nurse Practitioner

## 2021-11-10 DIAGNOSIS — I10 Essential (primary) hypertension: Secondary | ICD-10-CM

## 2021-11-10 NOTE — Telephone Encounter (Signed)
Requested Prescriptions  Pending Prescriptions Disp Refills  . solifenacin (VESICARE) 10 MG tablet [Pharmacy Med Name: SOLIFENACIN 10 MG TABLET] 90 tablet 0    Sig: TAKE 1 TABLET BY MOUTH EVERY DAY     Urology:  Bladder Agents 2 Passed - 11/10/2021  3:33 AM      Passed - Cr in normal range and within 360 days    Creatinine  Date Value Ref Range Status  02/26/2014 0.94 0.60 - 1.30 mg/dL Final   Creatinine, Ser  Date Value Ref Range Status  08/24/2021 1.01 0.76 - 1.27 mg/dL Final         Passed - ALT in normal range and within 360 days    ALT  Date Value Ref Range Status  08/24/2021 36 0 - 44 IU/L Final   SGPT (ALT)  Date Value Ref Range Status  04/07/2013 22 12 - 78 U/L Final   ALT (SGPT) Piccolo, Waived  Date Value Ref Range Status  01/03/2018 24 10 - 47 U/L Final         Passed - AST in normal range and within 360 days    AST  Date Value Ref Range Status  08/24/2021 32 0 - 40 IU/L Final   SGOT(AST)  Date Value Ref Range Status  04/07/2013 28 15 - 37 Unit/L Final   AST (SGOT) Piccolo, Waived  Date Value Ref Range Status  01/03/2018 34 11 - 38 U/L Final         Passed - Valid encounter within last 12 months    Recent Outpatient Visits          2 months ago Type 2 diabetes mellitus with obesity ([place])   [place], [place] T, NP   5 months ago Acute cough   [place], [place] T, NP   5 months ago Type 2 diabetes mellitus with obesity ([place])   [place], Jolene T, NP   7 months ago Abdominal pain, chronic, right lower quadrant   [place], Jolene T, NP   8 months ago Type 2 diabetes mellitus with obesity ([place])   [place], Barbaraann Faster, NP      Future Appointments            In 2 weeks Cannady, Barbaraann Faster, NP [employer], [place]   In 3 months Hollice Espy, MD [place]

## 2021-11-22 DIAGNOSIS — E1169 Type 2 diabetes mellitus with other specified complication: Secondary | ICD-10-CM

## 2021-11-22 DIAGNOSIS — F32A Depression, unspecified: Secondary | ICD-10-CM

## 2021-11-22 DIAGNOSIS — E785 Hyperlipidemia, unspecified: Secondary | ICD-10-CM

## 2021-11-22 DIAGNOSIS — I4891 Unspecified atrial fibrillation: Secondary | ICD-10-CM

## 2021-11-23 ENCOUNTER — Encounter
Admission: RE | Admit: 2021-11-23 | Discharge: 2021-11-23 | Disposition: A | Discharge status: Home / Self Care | Payer: Medicare Other | Source: Ambulatory Visit | Attending: Orthopedic Surgery | Admitting: Orthopedic Surgery

## 2021-11-23 DIAGNOSIS — R2241 Localized swelling, mass and lump, right lower limb: Secondary | ICD-10-CM | POA: Insufficient documentation

## 2021-11-23 DIAGNOSIS — Z96641 Presence of right artificial hip joint: Secondary | ICD-10-CM | POA: Diagnosis present

## 2021-11-23 MED ORDER — TECHNETIUM TC 99M MEDRONATE IV KIT
20.0000 | PACK | Freq: Once | INTRAVENOUS | Status: AC | PRN
  Administered 2021-11-23: 21.7 via INTRAVENOUS

## 2021-11-25 ENCOUNTER — Ambulatory Visit: Payer: Medicare Other | Admitting: Nurse Practitioner

## 2021-11-26 NOTE — Patient Instructions (Signed)
Diabetes Mellitus Basics  Diabetes mellitus, or diabetes, is a long-term (chronic) disease. It occurs when the body does not properly use sugar (glucose) that is released from food after you eat. Diabetes mellitus may be caused by one or both of these problems: Your pancreas does not make enough of a hormone called insulin. Your body does not react in a normal way to the insulin that it makes. Insulin lets glucose enter cells in your body. This gives you energy. If you have diabetes, glucose cannot get into cells. This causes high blood glucose (hyperglycemia). How to treat and manage diabetes You may need to take insulin or other diabetes medicines daily to keep your glucose in balance. If you are prescribed insulin, you will learn how to give yourself insulin by injection. You may need to adjust the amount of insulin you take based on the foods that you eat. You will need to check your blood glucose levels using a glucose monitor as told by your health care provider. The readings can help determine if you have low or high blood glucose. Generally, you should have these blood glucose levels: Before meals (preprandial): 80-130 mg/dL (4.4-7.2 mmol/L). After meals (postprandial): below 180 mg/dL (10 mmol/L). Hemoglobin A1c (HbA1c) level: less than 7%. Your health care provider will set treatment goals for you. Keep all follow-up visits. This is important. Follow these instructions at home: Diabetes medicines Take your diabetes medicines every day as told by your health care provider. List your diabetes medicines here: Name of medicine: ______________________________ Amount (dose): _______________ Time (a.m./p.m.): _______________ Notes: ___________________________________ Name of medicine: ______________________________ Amount (dose): _______________ Time (a.m./p.m.): _______________ Notes: ___________________________________ Name of medicine: ______________________________ Amount (dose):  _______________ Time (a.m./p.m.): _______________ Notes: ___________________________________ Insulin If you use insulin, list the types of insulin you use here: Insulin type: ______________________________ Amount (dose): _______________ Time (a.m./p.m.): _______________Notes: ___________________________________ Insulin type: ______________________________ Amount (dose): _______________ Time (a.m./p.m.): _______________ Notes: ___________________________________ Insulin type: ______________________________ Amount (dose): _______________ Time (a.m./p.m.): _______________ Notes: ___________________________________ Insulin type: ______________________________ Amount (dose): _______________ Time (a.m./p.m.): _______________ Notes: ___________________________________ Insulin type: ______________________________ Amount (dose): _______________ Time (a.m./p.m.): _______________ Notes: ___________________________________ Managing blood glucose  Check your blood glucose levels using a glucose monitor as told by your health care provider. Write down the times that you check your glucose levels here: Time: _______________ Notes: ___________________________________ Time: _______________ Notes: ___________________________________ Time: _______________ Notes: ___________________________________ Time: _______________ Notes: ___________________________________ Time: _______________ Notes: ___________________________________ Time: _______________ Notes: ___________________________________  Low blood glucose Low blood glucose (hypoglycemia) is when glucose is at or below 70 mg/dL (3.9 mmol/L). Symptoms may include: Feeling: Hungry. Sweaty and clammy. Irritable or easily upset. Dizzy. Sleepy. Having: A fast heartbeat. A headache. A change in your vision. Numbness around the mouth, lips, or tongue. Having trouble with: Moving (coordination). Sleeping. Treating low blood glucose To treat low blood  glucose, eat or drink something containing sugar right away. If you can think clearly and swallow safely, follow the 15:15 rule: Take 15 grams of a fast-acting carb (carbohydrate), as told by your health care provider. Some fast-acting carbs are: Glucose tablets: take 3-4 tablets. Hard candy: eat 3-5 pieces. Fruit juice: drink 4 oz (120 mL). Regular (not diet) soda: drink 4-6 oz (120-180 mL). Honey or sugar: eat 1 Tbsp (15 mL). Check your blood glucose levels 15 minutes after you take the carb. If your glucose is still at or below 70 mg/dL (3.9 mmol/L), take 15 grams of a carb again. If your glucose does not go above 70 mg/dL (3.9 mmol/L) after   3 tries, get help right away. After your glucose goes back to normal, eat a meal or a snack within 1 hour. Treating very low blood glucose If your glucose is at or below 54 mg/dL (3 mmol/L), you have very low blood glucose (severe hypoglycemia). This is an emergency. Do not wait to see if the symptoms will go away. Get medical help right away. Call your local emergency services (911 in the [place] Do not drive yourself to the hospital. Questions to ask your health care provider Should I talk with a diabetes educator? What equipment will I need to care for myself at home? What diabetes medicines do I need? When should I take them? How often do I need to check my blood glucose levels? What number can I call if I have questions? When is my follow-up visit? Where can I find a support group for people with diabetes? Where to find more information American Diabetes Association: www.diabetes.org Association of Diabetes Care and Education Specialists: www.diabeteseducator.org Contact a health care provider if: Your blood glucose is at or above 240 mg/dL (13.3 mmol/L) for 2 days in a row. You have been sick or have had a fever for 2 days or more, and you are not getting better. You have any of these problems for more than 6 hours: You cannot eat or  drink. You feel nauseous. You vomit. You have diarrhea. Get help right away if: Your blood glucose is lower than 54 mg/dL (3 mmol/L). You get confused. You have trouble thinking clearly. You have trouble breathing. These symptoms may represent a serious problem that is an emergency. Do not wait to see if the symptoms will go away. Get medical help right away. Call your local emergency services (911 in the [place] Do not drive yourself to the hospital. Summary Diabetes mellitus is a chronic disease that occurs when the body does not properly use sugar (glucose) that is released from food after you eat. Take insulin and diabetes medicines as told. Check your blood glucose every day, as often as told. Keep all follow-up visits. This is important. This information is not intended to replace advice given to you by your health care provider. Make sure you discuss any questions you have with your health care provider. Document Revised: 05/13/2019 Document Reviewed: 05/13/2019 Elsevier Patient Education  2023 [employer] Inc.  

## 2021-11-29 ENCOUNTER — Ambulatory Visit (INDEPENDENT_AMBULATORY_CARE_PROVIDER_SITE_OTHER): Payer: Medicare Other | Admitting: Nurse Practitioner

## 2021-11-29 ENCOUNTER — Encounter: Payer: Self-pay | Admitting: Nurse Practitioner

## 2021-11-29 VITALS — BP 124/78 | HR 77 | Temp 97.8°F | Ht 70.0 in | Wt 288.0 lb

## 2021-11-29 DIAGNOSIS — K219 Gastro-esophageal reflux disease without esophagitis: Secondary | ICD-10-CM

## 2021-11-29 DIAGNOSIS — I152 Hypertension secondary to endocrine disorders: Secondary | ICD-10-CM

## 2021-11-29 DIAGNOSIS — I442 Atrioventricular block, complete: Secondary | ICD-10-CM | POA: Diagnosis not present

## 2021-11-29 DIAGNOSIS — G4733 Obstructive sleep apnea (adult) (pediatric): Secondary | ICD-10-CM

## 2021-11-29 DIAGNOSIS — E1169 Type 2 diabetes mellitus with other specified complication: Secondary | ICD-10-CM | POA: Diagnosis not present

## 2021-11-29 DIAGNOSIS — E785 Hyperlipidemia, unspecified: Secondary | ICD-10-CM

## 2021-11-29 DIAGNOSIS — I951 Orthostatic hypotension: Secondary | ICD-10-CM

## 2021-11-29 DIAGNOSIS — I480 Paroxysmal atrial fibrillation: Secondary | ICD-10-CM | POA: Diagnosis not present

## 2021-11-29 DIAGNOSIS — N1831 Chronic kidney disease, stage 3a: Secondary | ICD-10-CM

## 2021-11-29 DIAGNOSIS — F339 Major depressive disorder, recurrent, unspecified: Secondary | ICD-10-CM

## 2021-11-29 DIAGNOSIS — E669 Obesity, unspecified: Secondary | ICD-10-CM

## 2021-11-29 DIAGNOSIS — E1159 Type 2 diabetes mellitus with other circulatory complications: Secondary | ICD-10-CM

## 2021-11-29 DIAGNOSIS — Z950 Presence of cardiac pacemaker: Secondary | ICD-10-CM

## 2021-11-29 DIAGNOSIS — G894 Chronic pain syndrome: Secondary | ICD-10-CM

## 2021-11-29 DIAGNOSIS — D6869 Other thrombophilia: Secondary | ICD-10-CM

## 2021-11-29 DIAGNOSIS — Z79899 Other long term (current) drug therapy: Secondary | ICD-10-CM

## 2021-11-29 DIAGNOSIS — F419 Anxiety disorder, unspecified: Secondary | ICD-10-CM

## 2021-11-29 DIAGNOSIS — I7143 Infrarenal abdominal aortic aneurysm, without rupture: Secondary | ICD-10-CM

## 2021-11-29 LAB — BAYER DCA HB A1C WAIVED: HB A1C (BAYER DCA - WAIVED): 6.8 % — ABNORMAL HIGH (ref 4.8–5.6)

## 2021-11-29 NOTE — Assessment & Plan Note (Signed)
Chronic, stable, followed by cardiology.  Rate control present.  Continue current medication regimen as prescribed by them.  Labs: CMP.  Ablation last on 05/17/20.  Would benefit repeat echo in new year.

## 2021-11-29 NOTE — Assessment & Plan Note (Signed)
BMI 41.32 with HTN, A-Fib, Heart Block, and CKD.  Recommended eating smaller high protein, low fat meals more frequently and exercising 30 mins a day 5 times a week with a goal of 10-15lb weight loss in the next 3 months. Patient voiced their understanding and motivation to adhere to these recommendations.

## 2021-11-29 NOTE — Assessment & Plan Note (Signed)
Ongoing and stable.  Have recommended 100% use and trial cut back on Ativan now that has CPAP.

## 2021-11-29 NOTE — Assessment & Plan Note (Signed)
Chronic, ongoing followed by pain clinic, Dr. Andree Elk.  Discussed at length risk of benzo and opioid use in conjunction with each other.  Recommend he not take the two together at same hour during day or evening.

## 2021-11-29 NOTE — Assessment & Plan Note (Signed)
Chronic, ongoing.  Continue current medication regimen and adjust as needed. Lipid panel today. 

## 2021-11-29 NOTE — Assessment & Plan Note (Signed)
Continue collaboration with cardiology + neurology + medication regimen as prescribed by them.  Recommend ensuring good hydration throughout daytime hours + use of compression hose at home (on during daytime and off during night), agrees to try them, although does endorse not wearing.

## 2021-11-29 NOTE — Assessment & Plan Note (Signed)
Ongoing stage 3a, recheck CMP today and adjust medications as needed.  His recent labs since July 2022 have been within good ranges and improved.

## 2021-11-29 NOTE — Assessment & Plan Note (Signed)
On Eliquis with A-fib.  Continue to monitor closely for bleeding or increased bruising.  CBC annually.

## 2021-11-29 NOTE — Progress Notes (Signed)
BP 124/78   Pulse 77   Temp 97.8 F (36.6 C) (Oral)   Ht '5\' 10"'$  (1.778 m)   Wt 288 lb (130.6 kg)   SpO2 94%   BMI 41.32 kg/m    Subjective:    Patient ID: Nicholas [name], male    DOB: [birth year], 76 y.o.   MRN: [id number]  HPI: Nicholas [name] is a 76 y.o. male  Chief Complaint  Patient presents with   Diabetes    Patient has an upcoming appointment in December.    Hypertension   Hyperlipidemia   Sleep Apnea   Gastroesophageal Reflux   DIABETES Last A1c in August 5.9% -- was still taking Ozempic at time, but we stopped this due to GI symptoms, including increased heart burn.  Currently takes Protonix daily for GERD and had been well controlled on this until taking Ozempic. Hypoglycemic episodes:no Polydipsia/polyuria: no Visual disturbance: no Chest pain: no Paresthesias: no Glucose Monitoring: yes  Accucheck frequency: not checking  Fasting glucose:   Post prandial:  Evening:  Before meals: Taking Insulin?: no  Long acting insulin:  Short acting insulin: Blood Pressure Monitoring: daily Retinal Examination: Up To Date -- Dr. Hall Busing Exam: Up to Date Pneumovax: Up to Date Influenza: Up to Date Aspirin: no   HYPERTENSION / HYPERLIPIDEMIA/A-FIB Saw cardiology 04/05/21 last, no need to return for one year.  Had ablation on 05/17/20.  Echo last October 2021 noting EF 60-65%., no LVH, and Grade I Diastolic dysfunction.  Last pacemaker check 09/12/21.  OSA diagnosed June 2022 -- is using CPAP 100% of the time and finds benefit with sleep pattern and this.    Neurology last 09/14/21 for neuropathy and orthostatic BP -- they stopped Midodrine due to elevations in BP and continued Florinef 100 MCG daily.  Parkinson's testing was negative past visit.     Continues on Rosuvastatin, Eliquis, fish oil. Satisfied with current treatment? yes Duration of hypertension: chronic BP monitoring frequency: not checking BP range:  BP medication side effects: no Duration of  hyperlipidemia: chronic Cholesterol medication side effects: no Cholesterol supplements: fish oil Medication compliance: good compliance Aspirin: no Recent stressors: no Recurrent headaches: no Visual changes: no Palpitations: no Dyspnea: if exerting self a lot -- recent episodes in [place] as forgot Torsemide  Chest pain: no Lower extremity edema: when forgot Torsemide Dizzy/lightheaded: occasional  CHRONIC KIDNEY DISEASE CKD status: stable Medications renally dose: yes Previous renal evaluation: no Pneumovax:  Up to Date Influenza Vaccine:  Up to Date   DEPRESSION & CHRONIC PAIN Taking Effexor, Seroquel, and Ativan.  Pt and his wife at bedside made aware of risks of benzo medication use to include increased sedation, respiratory suppression, falls, extrapyramidal movements, dependence and cardiovascular events.  Pt and his wife would like to continue treatment as benefit determined to outweigh risk.  Have had at length discussions with him that he is also on opioid therapy.  Discussed risks with taking these three medications together at same time and recommend to separate them when taking Seroquel, Ativan, and opioid.  He is a English as a second language teacher.   He has tried in past taking 1/2 tablet Ativan, but this does not work well (per wife and patient) and wishes to maintain current dosing.  Last Ativan fill on PDMP review 10/24/21 and last opioid fill 10/26/21.  Recent UDS with pain management on 10/19/21. Saw Dr. Andree Elk on 10/18/21 -- hydrocodone increased -- but he has been taking one in morning and one at night with benefit  at this time. Duration:stable Anxious mood: yes  Excessive worrying: yes Irritability: yes Sweating: no Nausea: no Palpitations:no Hyperventilation: no Panic attacks: no Agoraphobia: no  Obscessions/compulsions: no Depressed mood: yes    11/29/2021    8:53 AM 10/18/2021   10:39 AM 08/24/2021    8:38 AM 05/23/2021    9:05 AM 05/02/2021    8:39 AM  Depression screen PHQ 2/9   Decreased Interest 0 0 '2 3 3  '$ Down, Depressed, Hopeless 0 0 '3 3 3  '$ PHQ - 2 Score 0 0 '5 6 6  '$ Altered sleeping 0  3 0 1  Tired, decreased energy 0  '3 2 3  '$ Change in appetite 0  '2 2 1  '$ Feeling bad or failure about yourself  0  '3 2 2  '$ Trouble concentrating 0  1 0 0  Moving slowly or fidgety/restless 0  0 0 0  Suicidal thoughts 0  3 3 0  PHQ-9 Score 0  '20 15 13  '$ Difficult doing work/chores Not difficult at all    Very difficult       11/29/2021    8:53 AM 08/24/2021    8:38 AM 05/23/2021    9:05 AM 02/21/2021   11:10 AM  GAD 7 : Generalized Anxiety Score  Nervous, Anxious, on Edge 1 3 0 0  Control/stop worrying '2 3 3 '$ 0  Worry too much - different things '1 3 3 '$ 0  Trouble relaxing '1 3 1 '$ 0  Restless 1 3 0 0  Easily annoyed or irritable '1 3 2 '$ 0  Afraid - awful might happen '1 3 2 '$ 0  Total GAD 7 Score '8 21 11 '$ 0  Anxiety Difficulty Not difficult at all Extremely difficult Somewhat difficult Not difficult at all    Relevant past medical, surgical, family and social history reviewed and updated as indicated. Interim medical history since our last visit reviewed. Allergies and medications reviewed and updated.  Review of Systems  Constitutional:  Negative for activity change, appetite change, fatigue and fever.  Respiratory:  Negative for cough, chest tightness, shortness of breath and wheezing.   Cardiovascular:  Negative for chest pain, palpitations and leg swelling.  Endocrine: Negative for polydipsia, polyphagia and polyuria.  Musculoskeletal:  Positive for arthralgias.  Neurological: Negative.   Psychiatric/Behavioral:  Negative for decreased concentration, self-injury, sleep disturbance and suicidal ideas. The patient is not nervous/anxious.     Per HPI unless specifically indicated above     Objective:    BP 124/78   Pulse 77   Temp 97.8 F (36.6 C) (Oral)   Ht '5\' 10"'$  (1.778 m)   Wt 288 lb (130.6 kg)   SpO2 94%   BMI 41.32 kg/m   Wt Readings from Last 3 Encounters:   11/29/21 288 lb (130.6 kg)  10/18/21 288 lb (130.6 kg)  09/06/21 282 lb (127.9 kg)    Physical Exam Vitals and nursing note reviewed.  Constitutional:      General: He is awake. He is not in acute distress.    Appearance: He is well-developed and well-groomed. He is obese.  HENT:     Head: Normocephalic and atraumatic.     Right Ear: Hearing normal. No drainage.     Left Ear: Hearing normal. No drainage.  Eyes:     General: Lids are normal.        Right eye: No discharge.        Left eye: No discharge.     Conjunctiva/sclera: Conjunctivae normal.  Pupils: Pupils are equal, round, and reactive to light.  Neck:     Thyroid: No thyromegaly.     Vascular: No carotid bruit.  Cardiovascular:     Rate and Rhythm: Normal rate and regular rhythm.     Heart sounds: Normal heart sounds, S1 normal and S2 normal. No murmur heard.    No gallop.  Pulmonary:     Effort: Pulmonary effort is normal. No accessory muscle usage or respiratory distress.     Breath sounds: Normal breath sounds.  Abdominal:     General: Bowel sounds are normal. There is no distension.     Palpations: Abdomen is soft.     Tenderness: There is no abdominal tenderness.  Musculoskeletal:        General: Normal range of motion.     Cervical back: Normal range of motion and neck supple.     Right lower leg: No edema.     Left lower leg: No edema.  Skin:    General: Skin is warm and dry.  Neurological:     Mental Status: He is alert and oriented to person, place, and time.  Psychiatric:        Attention and Perception: Attention normal.        Mood and Affect: Mood normal.        Behavior: Behavior normal. Behavior is cooperative.        Thought Content: Thought content normal.    Results for orders placed or performed in visit on 10/19/21  ToxASSURE Select 13 (MW), Urine  Result Value Ref Range   Summary Note       Assessment & Plan:   Problem List Items Addressed This Visit       Cardiovascular  and Mediastinum   PAF (paroxysmal atrial fibrillation) (HCC) (Chronic)    Chronic, stable, followed by cardiology.  Rate control present.  Continue current medication regimen as prescribed by them.  Labs: CMP.  Ablation last on 05/17/20.  Would benefit repeat echo in new year.      Relevant Orders   Comprehensive metabolic panel   Chronic orthostatic hypotension    Continue collaboration with cardiology + neurology + medication regimen as prescribed by them.  Recommend ensuring good hydration throughout daytime hours + use of compression hose at home (on during daytime and off during night), agrees to try them, although does endorse not wearing.      Complete heart block (HCC)    Ongoing and stable.  Followed by cardiology.  Pacemaker checks by them.       Relevant Orders   Comprehensive metabolic panel   Hypertension associated with diabetes (HCC)    Chronic, stable.  BP at goal today.  Followed by cardiology.  With orthostatic BP presenting occasionally.  Continue current regimen at this time and have recommended utilizing compression hose at home, agrees to try this.  Continue collaboration with cardiology + neurology and current medication regimen.  Labs: CMP.  Recommend ensuring good fluid intake at home.        Relevant Orders   Bayer DCA Hb A1c Waived     Respiratory   OSA (obstructive sleep apnea)    Ongoing and stable.  Have recommended 100% use and trial cut back on Ativan now that has CPAP.        Digestive   GERD (gastroesophageal reflux disease) (Chronic)    Chronic, stable on Protonix.  Continue current medication regimen and check Mag level annually.  Relevant Orders   Magnesium     Endocrine   Hyperlipidemia associated with type 2 diabetes mellitus (HCC)    Chronic, ongoing.  Continue current medication regimen and adjust as needed. Lipid panel today.      Relevant Orders   Bayer DCA Hb A1c Waived   Comprehensive metabolic panel   Lipid Panel w/o  Chol/HDL Ratio   Type 2 diabetes mellitus with obesity ([place]) - Primary    Diagnosed on 02/21/21, started on Ozempic, but this was stopped in August 2023 due to poor tolerance.  A1c today trend up some to 6.8%.  His wife and him wish to not start new medication, but focus on diet over next 3 months.  Will consider SGLT2 in future which would offer heart and kidney protection. Glucometer supplies present, recommend he check sugars at least daily fasting.  Labs today.  Return in 3 months.      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  His recent labs since July 2022 have been within good ranges and improved.      Relevant Orders   Comprehensive metabolic panel     Other   Anxiety (Chronic)    Chronic, ongoing.  Discussed at length risk of benzo and opioid use in conjunction with each other.  Recommend he not take the two together at same hour during day or evening. Recommend now that has CPAP and improved sleep trial cut back slowly on Ativan.   Continue to collaborate with CCM team on education.  Provided wife and him with copy of [facility] ben[facility] risk information patient sheet in past and they are aware of risks.  They request 90 day supply, as previous PCP supplied this.  Are aware he does have to return every 3 months for refills.  UDS up to date with pain management.  Refills sent.        Chronic pain syndrome (Chronic)    Chronic, ongoing followed by pain clinic, Dr. Andree Elk.  Discussed at length risk of benzo and opioid use in conjunction with each other.  Recommend he not take the two together at same hour during day or evening.        Depression, recurrent (HCC) (Chronic)    Chronic, ongoing.  Denies SI/HI.  Continue current medication regimen and adjust as needed.  Would benefit from trial off Effexor and trial of SSRI, but refuses this.         Long-term current use of benzodiazepine (Chronic)    Has been on Ativan for many years, with trial of cutting back unsuccessful.  At length discussions about risk of opioid and benzo use had with patient and wife by both PCP and CCM PharmD.  Continue these discussions.  Refer to anxiety plan.      Morbid obesity (HCC) (Chronic)    BMI 41.32 with HTN, A-Fib, Heart Block, and CKD.  Recommended eating smaller high protein, low fat meals more frequently and exercising 30 mins a day 5 times a week with a goal of 10-15lb weight loss in the next 3 months. Patient voiced their understanding and motivation to adhere to these recommendations.       Pacemaker - MDT (Chronic)    Regular checks with cardiology.      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  Continue to monitor closely for bleeding or increased bruising.  CBC annually.        Follow up plan: Return in about 3 months (around 03/01/2022) for T2DM, HTN/HLD, GERD, OSA, MOOD.

## 2021-11-29 NOTE — Assessment & Plan Note (Signed)
Diagnosed on 02/21/21, started on Ozempic, but this was stopped in August 2023 due to poor tolerance.  A1c today trend up some to 6.8%.  His wife and him wish to not start new medication, but focus on diet over next 3 months.  Will consider SGLT2 in future which would offer heart and kidney protection. Glucometer supplies present, recommend he check sugars at least daily fasting.  Labs today.  Return in 3 months.

## 2021-11-29 NOTE — Assessment & Plan Note (Signed)
Chronic, ongoing.  Denies SI/HI.  Continue current medication regimen and adjust as needed.  Would benefit from trial off Effexor and trial of SSRI, but refuses this.

## 2021-11-29 NOTE — Assessment & Plan Note (Signed)
Regular checks with cardiology.

## 2021-11-29 NOTE — Assessment & Plan Note (Signed)
Chronic, stable.  BP at goal today.  Followed by cardiology.  With orthostatic BP presenting occasionally.  Continue current regimen at this time and have recommended utilizing compression hose at home, agrees to try this.  Continue collaboration with cardiology + neurology and current medication regimen.  Labs: CMP.  Recommend ensuring good fluid intake at home.

## 2021-11-29 NOTE — Assessment & Plan Note (Signed)
Chronic, stable on Protonix.  Continue current medication regimen and check Mag level annually.

## 2021-11-29 NOTE — Assessment & Plan Note (Signed)
Ongoing and stable.  Followed by cardiology.  Pacemaker checks by them.

## 2021-11-29 NOTE — Assessment & Plan Note (Signed)
Has been on Ativan for many years, with trial of cutting back unsuccessful.  At length discussions about risk of opioid and benzo use had with patient and wife by both PCP and CCM PharmD.  Continue these discussions.  Refer to anxiety plan.

## 2021-11-29 NOTE — Assessment & Plan Note (Signed)
Chronic, ongoing.  Discussed at length risk of benzo and opioid use in conjunction with each other.  Recommend he not take the two together at same hour during day or evening. Recommend now that has CPAP and improved sleep trial cut back slowly on Ativan.   Continue to collaborate with CCM team on education.  Provided wife and him with copy of [facility] benzo risk information patient sheet in past and they are aware of risks.  They request 90 day supply, as previous PCP supplied this.  Are aware he does have to return every 3 months for refills.  UDS up to date with pain management.  Refills sent.

## 2021-11-30 ENCOUNTER — Other Ambulatory Visit: Payer: Self-pay | Admitting: Nurse Practitioner

## 2021-11-30 DIAGNOSIS — I480 Paroxysmal atrial fibrillation: Secondary | ICD-10-CM

## 2021-11-30 DIAGNOSIS — E1159 Type 2 diabetes mellitus with other circulatory complications: Secondary | ICD-10-CM

## 2021-11-30 DIAGNOSIS — E1169 Type 2 diabetes mellitus with other specified complication: Secondary | ICD-10-CM

## 2021-11-30 LAB — COMPREHENSIVE METABOLIC PANEL
ALT: 13 IU/L (ref 0–44)
AST: 29 IU/L (ref 0–40)
Albumin/Globulin Ratio: 2 (ref 1.2–2.2)
Albumin: 4.4 g/dL (ref 3.8–4.8)
Alkaline Phosphatase: 55 IU/L (ref 44–121)
BUN/Creatinine Ratio: 20 (ref 10–24)
BUN: 23 mg/dL (ref 8–27)
Bilirubin Total: 0.6 mg/dL (ref 0.0–1.2)
CO2: 24 mmol/L (ref 20–29)
Calcium: 9.4 mg/dL (ref 8.6–10.2)
Chloride: 100 mmol/L (ref 96–106)
Creatinine, Ser: 1.14 mg/dL (ref 0.76–1.27)
Globulin, Total: 2.2 g/dL (ref 1.5–4.5)
Glucose: 144 mg/dL — ABNORMAL HIGH (ref 70–99)
Potassium: 4.3 mmol/L (ref 3.5–5.2)
Sodium: 140 mmol/L (ref 134–144)
Total Protein: 6.6 g/dL (ref 6.0–8.5)
eGFR: 67 mL/min/{1.73_m2} (ref >59)

## 2021-11-30 LAB — LIPID PANEL W/O CHOL/HDL RATIO
Cholesterol, Total: 135 mg/dL (ref 100–199)
HDL: 49 mg/dL (ref >39)
LDL Chol Calc (NIH): 63 mg/dL (ref 0–99)
Triglycerides: 129 mg/dL (ref 0–149)
VLDL Cholesterol Cal: 23 mg/dL (ref 5–40)

## 2021-11-30 LAB — MAGNESIUM: Magnesium: 2.2 mg/dL (ref 1.6–2.3)

## 2021-11-30 NOTE — Progress Notes (Signed)
Contacted via MyChart   Good day Nicholas [name], your labs have returned and overall remain stable.  Kidney function, creatinine and eGFR, remains normal, as is liver function, AST and ALT. Cholesterol levels at goal and magnesium level normal.  Great job!!  No changes needed.  Proud of you!! Keep being wonderful!!  Thank you for allowing me to participate in your care.  I appreciate you. Kindest regards, Shoshanah Dapper

## 2021-12-01 ENCOUNTER — Telehealth: Payer: Self-pay

## 2021-12-01 NOTE — Progress Notes (Signed)
  Chronic Care Management   Note  12/01/2021 Name: Nicholas [name] MRN: [id number] DOB: [birth year]  Nicholas [name] is a 76 y.o. year old male who is a primary care patient of Cannady, Barbaraann Faster, NP. I reached out to Nicholas [name] by phone today in response to a referral sent by Nicholas [name] PCP.  Nicholas [name]  agreedto scheduling an appointment with the CCM RN Case Manager   Follow up plan: Patient agreed to scheduled appointment with RN Case Manager on 12/07/2021(date/time).   Noreene Larsson, [place], [place] [address] Direct Dial: [phone] [email or web address]

## 2021-12-05 ENCOUNTER — Ambulatory Visit: Payer: Medicare Other | Attending: Anesthesiology | Admitting: Anesthesiology

## 2021-12-05 ENCOUNTER — Encounter: Payer: Self-pay | Admitting: Anesthesiology

## 2021-12-05 VITALS — BP 140/75 | HR 77 | Temp 98.1°F | Resp 18 | Ht 70.0 in | Wt 283.0 lb

## 2021-12-05 DIAGNOSIS — G8929 Other chronic pain: Secondary | ICD-10-CM

## 2021-12-05 DIAGNOSIS — M62830 Muscle spasm of back: Secondary | ICD-10-CM | POA: Insufficient documentation

## 2021-12-05 DIAGNOSIS — M47816 Spondylosis without myelopathy or radiculopathy, lumbar region: Secondary | ICD-10-CM | POA: Diagnosis present

## 2021-12-05 DIAGNOSIS — M79642 Pain in left hand: Secondary | ICD-10-CM

## 2021-12-05 DIAGNOSIS — R1031 Right lower quadrant pain: Secondary | ICD-10-CM | POA: Diagnosis present

## 2021-12-05 DIAGNOSIS — M79641 Pain in right hand: Secondary | ICD-10-CM | POA: Diagnosis present

## 2021-12-05 DIAGNOSIS — M5431 Sciatica, right side: Secondary | ICD-10-CM | POA: Diagnosis not present

## 2021-12-05 DIAGNOSIS — M5416 Radiculopathy, lumbar region: Secondary | ICD-10-CM | POA: Insufficient documentation

## 2021-12-05 DIAGNOSIS — M5136 Other intervertebral disc degeneration, lumbar region: Secondary | ICD-10-CM | POA: Diagnosis not present

## 2021-12-05 DIAGNOSIS — F1190 Opioid use, unspecified, uncomplicated: Secondary | ICD-10-CM | POA: Diagnosis present

## 2021-12-05 DIAGNOSIS — M5432 Sciatica, left side: Secondary | ICD-10-CM | POA: Insufficient documentation

## 2021-12-05 DIAGNOSIS — M1611 Unilateral primary osteoarthritis, right hip: Secondary | ICD-10-CM | POA: Insufficient documentation

## 2021-12-05 DIAGNOSIS — G894 Chronic pain syndrome: Secondary | ICD-10-CM | POA: Diagnosis present

## 2021-12-05 MED ORDER — HYDROCODONE-ACETAMINOPHEN 5-325 MG PO TABS: 1.0000 | ORAL_TABLET | Freq: Two times a day (BID) | ORAL | 0 refills | Status: AC | PRN

## 2021-12-05 MED ORDER — HYDROCODONE-ACETAMINOPHEN 5-325 MG PO TABS: 1.0000 | ORAL_TABLET | Freq: Two times a day (BID) | ORAL | 0 refills | Status: DC | PRN

## 2021-12-05 NOTE — Progress Notes (Signed)
Nursing Pain Medication Assessment:  Safety precautions to be maintained throughout the outpatient stay will include: orient to surroundings, keep bed in low position, maintain call bell within reach at all times, provide assistance with transfer out of bed and ambulation.  Medication Inspection Compliance: Pill count conducted under aseptic conditions, in front of the patient. Neither the pills nor the bottle was removed from the patient's sight at any time. Once count was completed pills were immediately returned to the patient in their original bottle.  Medication: See above Pill/Patch Count:  61 of 120 pills remain Pill/Patch Appearance: Markings consistent with prescribed medication Bottle Appearance: Standard pharmacy container. Clearly labeled. Filled Date: 10 / 02 / 2023 Last Medication intake:  Today

## 2021-12-07 ENCOUNTER — Telehealth: Payer: Medicare Other

## 2021-12-07 ENCOUNTER — Ambulatory Visit (INDEPENDENT_AMBULATORY_CARE_PROVIDER_SITE_OTHER): Payer: Medicare Other

## 2021-12-07 DIAGNOSIS — I480 Paroxysmal atrial fibrillation: Secondary | ICD-10-CM

## 2021-12-07 DIAGNOSIS — E1169 Type 2 diabetes mellitus with other specified complication: Secondary | ICD-10-CM

## 2021-12-07 DIAGNOSIS — E1159 Type 2 diabetes mellitus with other circulatory complications: Secondary | ICD-10-CM

## 2021-12-07 NOTE — Chronic Care Management (AMB) (Signed)
Chronic Care Management   CCM RN Visit Note  12/07/2021 Name: Nicholas [name] MRN: [id number] DOB: [birth year]  Subjective: Nicholas [name] is a 76 y.o. year old male who is a primary care patient of Cannady, Barbaraann Faster, NP. The patient was referred to the Chronic Care Management team for assistance with care management needs subsequent to provider initiation of CCM services and plan of care.    Today's Visit:  Engaged with patient by telephone for initial visit.     SDOH Interventions Today    Flowsheet Row Most Recent Value  SDOH Interventions   Food Insecurity Interventions Intervention Not Indicated  Housing Interventions Intervention Not Indicated  Transportation Interventions Intervention Not Indicated  Utilities Interventions Intervention Not Indicated  Alcohol Usage Interventions Intervention Not Indicated (Score <7)  Financial Strain Interventions Other (Comment)  [makes too much for PAP]  Physical Activity Interventions Other (Comments)  [the patient does not do structured activity]  Stress Interventions Intervention Not Indicated  Social Connections Interventions Intervention Not Indicated         Goals Addressed             This Visit's Progress    CCM Expected Outcome:  Monitor, Self-Manage and Reduce Symptoms of Afib       Current Barriers:  Knowledge Deficits related to how to tell if his heart rate is out of rhythm  with condition of AFIB Chronic Disease Management support and education needs related to effective management of AFIB  Planned Interventions: Provider order and care plan reviewed. Collaborated with PharmD regarding patient care and plan. Counseled on increased risk of stroke due to Afib and benefits of anticoagulation for stroke prevention           Reviewed importance of adherence to anticoagulant exactly as prescribed Advised patient to discuss changes in heart rate, changes in activity level, acute onset of shortness of breath, or other questions  and concerns related to AFIB with provider Counseled on bleeding risk associated with AFIB and importance of self-monitoring for signs/symptoms of bleeding Counseled on avoidance of NSAIDs due to increased bleeding risk with anticoagulants Counseled on importance of regular laboratory monitoring as prescribed Counseled on seeking medical attention after a head injury or if there is blood in the urine/stool Afib action plan reviewed Screening for signs and symptoms of depression related to chronic disease state Assessed social determinant of health barriers  Symptom Management: Take medications as prescribed   Attend all scheduled provider appointments Call provider office for new concerns or questions  call the Suicide and Crisis Lifeline: 988 call the [place] National Suicide Prevention Lifeline: (340) 226-3055 or TTY: 514-288-7472 TTY [phone]) to talk to a trained counselor call 1-800-273-TALK (toll free, 24 hour hotline) if experiencing a Mental Health or [place]  - make a plan to eat healthy - keep all lab appointments - take medicine as prescribed  Follow Up Plan: Telephone follow up appointment with care management team member scheduled for: 02-09-2022 at 1 pm       CCM Expected Outcome:  Monitor, Self-Manage and Reduce Symptoms of Diabetes       Current Barriers:  Knowledge Deficits related to monitoring for changes in blood sugars and the effect of elevated blood sugars on other body systems Chronic Disease Management support and education needs related to effective management of DM  Planned Interventions: Provided education to patient about basic DM disease process; Reviewed medications with patient and discussed importance of medication adherence;  Reviewed prescribed diet with patient heart healthy/ADA diet ; Counseled on importance of regular laboratory monitoring as prescribed;        Discussed plans with patient for ongoing care management  follow up and provided patient with direct contact information for care management team;      Provided patient with written educational materials related to hypo and hyperglycemia and importance of correct treatment;       Reviewed scheduled/upcoming provider appointments including: 03-01-2022 at 820 am;         Advised patient, providing education and rationale, to check cbg when you have symptoms of low or high blood sugar and as directed   and record        Referral made to pharmacy team for assistance with medication reconciliation, education and support. Works with [facility] D on a consistent basis;       Review of patient status, including review of consultants reports, relevant laboratory and other test results, and medications completed;       Advised patient to discuss changes in DM health and well being with provider;      Screening for signs and symptoms of depression related to chronic disease state;        Assessed social determinant of health barriers;         Symptom Management: Take medications as prescribed   Attend all scheduled provider appointments Call provider office for new concerns or questions  call the Suicide and Crisis Lifeline: 988 call the [place] National Suicide Prevention Lifeline: 863-471-9959 or TTY: 484-223-0032 TTY (636)154-3928) to talk to a trained counselor call 1-800-273-TALK (toll free, 24 hour hotline) if experiencing a Mental Health or [place]  check feet daily for cuts, sores or redness trim toenails straight across manage portion size wash and dry feet carefully every day wear comfortable, cotton socks wear comfortable, well-fitting shoes  Follow Up Plan: Telephone follow up appointment with care management team member scheduled for: 02-09-2022 at 1 pm       Goal: CCM (HTN) EXPECTED OUTCOME: MONITOR, SELF-MANAGE AND REDUCE SYMPTOMS OF HTN       Current Barriers:  Knowledge Deficits related to benefits of taking blood pressures on a  regular basis and normal values  Chronic Disease Management support and education needs related to effective management of HTN   BP Readings from Last 3 Encounters:  12/05/21 (!) 140/75  11/29/21 124/78  10/18/21 (!) 164/101     Planned Interventions: Evaluation of current treatment plan related to hypertension self management and patient's adherence to plan as established by provider;   Provided education to patient re: stroke prevention, s/s of heart attack and stroke; Reviewed prescribed diet heart healthy/ADA diet Reviewed medications with patient and discussed importance of compliance. Patient states compliance with medications.    Discussed plans with patient for ongoing care management follow up and provided patient with direct contact information for care management team; Advised patient, providing education and rationale, to monitor blood pressure daily and record, calling PCP for findings outside established parameters. The patient states that his blood pressures are higher now. He has not had to take the medications to raise his blood pressures. Education on systolic goal of <462 and diastolic of <70 Reviewed scheduled/upcoming provider appointments including: 03-01-2022 at 0820 am with the pcp Advised patient to discuss changes in blood pressures or heart health with provider; Provided education on prescribed diet heart healthy/ADA diet;  Discussed complications of poorly controlled blood pressure such as heart disease, stroke,  circulatory complications, vision complications, kidney impairment, sexual dysfunction;  Screening for signs and symptoms of depression related to chronic disease state;  Assessed social determinant of health barriers;   Symptom Management: Take medications as prescribed   Attend all scheduled provider appointments Perform all self care activities independently  Call provider office for new concerns or questions  call the Suicide and Crisis Lifeline:  988 call the [place] National Suicide Prevention Lifeline: 405-391-3085 or TTY: 628-502-7178 TTY [phone]) to talk to a trained counselor call 1-800-273-TALK (toll free, 24 hour hotline) if experiencing a Mental Health or [place]  check blood pressure weekly learn about high blood pressure keep a blood pressure log take blood pressure log to all doctor appointments call doctor for signs and symptoms of high blood pressure develop an action plan for high blood pressure keep all doctor appointments take medications for blood pressure exactly as prescribed report new symptoms to your doctor  Follow Up Plan: Telephone follow up appointment with care management team member scheduled for: 02-09-2022 at 1 pm          Plan:Telephone follow up appointment with care management team member scheduled for:  02-09-2022 at 1 pm  Noreene Larsson RN, MSN, CCM RN Care Manager  Chronic Care Management Direct Number: [phone]

## 2021-12-07 NOTE — Patient Instructions (Signed)
Please call the care guide team at [phone] if you need to cancel or reschedule your appointment.   If you are experiencing a Mental Health or [place] or need someone to talk to, please call the Suicide and Crisis Lifeline: 988 call the [place] National Suicide Prevention Lifeline: 510-794-3277 or TTY: 907-439-6219 TTY [phone]) to talk to a trained counselor call 1-800-273-TALK (toll free, 24 hour hotline)   Following is a copy of your full provider care plan:   Goals Addressed             This Visit's Progress    CCM Expected Outcome:  Monitor, Self-Manage and Reduce Symptoms of Afib       Current Barriers:  Knowledge Deficits related to how to tell if his heart rate is out of rhythm  with condition of AFIB Chronic Disease Management support and education needs related to effective management of AFIB  Planned Interventions: Provider order and care plan reviewed. Collaborated with PharmD regarding patient care and plan. Counseled on increased risk of stroke due to Afib and benefits of anticoagulation for stroke prevention           Reviewed importance of adherence to anticoagulant exactly as prescribed Advised patient to discuss changes in heart rate, changes in activity level, acute onset of shortness of breath, or other questions and concerns related to AFIB with provider Counseled on bleeding risk associated with AFIB and importance of self-monitoring for signs/symptoms of bleeding Counseled on avoidance of NSAIDs due to increased bleeding risk with anticoagulants Counseled on importance of regular laboratory monitoring as prescribed Counseled on seeking medical attention after a head injury or if there is blood in the urine/stool Afib action plan reviewed Screening for signs and symptoms of depression related to chronic disease state Assessed social determinant of health barriers  Symptom Management: Take medications as prescribed   Attend all scheduled  provider appointments Call provider office for new concerns or questions  call the Suicide and Crisis Lifeline: 988 call the [place] National Suicide Prevention Lifeline: 9797171246 or TTY: 559-820-6030 TTY [phone]) to talk to a trained counselor call 1-800-273-TALK (toll free, 24 hour hotline) if experiencing a Mental Health or [place]  - make a plan to eat healthy - keep all lab appointments - take medicine as prescribed  Follow Up Plan: Telephone follow up appointment with care management team member scheduled for: 02-09-2022 at 1 pm       CCM Expected Outcome:  Monitor, Self-Manage and Reduce Symptoms of Diabetes       Current Barriers:  Knowledge Deficits related to monitoring for changes in blood sugars and the effect of elevated blood sugars on other body systems Chronic Disease Management support and education needs related to effective management of DM  Planned Interventions: Provided education to patient about basic DM disease process; Reviewed medications with patient and discussed importance of medication adherence;        Reviewed prescribed diet with patient heart healthy/ADA diet ; Counseled on importance of regular laboratory monitoring as prescribed;        Discussed plans with patient for ongoing care management follow up and provided patient with direct contact information for care management team;      Provided patient with written educational materials related to hypo and hyperglycemia and importance of correct treatment;       Reviewed scheduled/upcoming provider appointments including: 03-01-2022 at 820 am;         Advised patient, providing education and rationale,  to check cbg when you have symptoms of low or high blood sugar and as directed   and record        Referral made to pharmacy team for assistance with medication reconciliation, education and support. Works with [place] D on a consistent basis;       Review of patient status, including  review of consultants reports, relevant laboratory and other test results, and medications completed;       Advised patient to discuss changes in DM health and well being with provider;      Screening for signs and symptoms of depression related to chronic disease state;        Assessed social determinant of health barriers;         Symptom Management: Take medications as prescribed   Attend all scheduled provider appointments Call provider office for new concerns or questions  call the Suicide and Crisis Lifeline: 988 call the [place] National Suicide Prevention Lifeline: 320 746 2396 or TTY: (825)056-5952 TTY 480-836-5082) to talk to a trained counselor call 1-800-273-TALK (toll free, 24 hour hotline) if experiencing a Mental Health or [place]  check feet daily for cuts, sores or redness trim toenails straight across manage portion size wash and dry feet carefully every day wear comfortable, cotton socks wear comfortable, well-fitting shoes  Follow Up Plan: Telephone follow up appointment with care management team member scheduled for: 02-09-2022 at 1 pm       Goal: CCM (HTN) EXPECTED OUTCOME: MONITOR, SELF-MANAGE AND REDUCE SYMPTOMS OF HTN       Current Barriers:  Knowledge Deficits related to benefits of taking blood pressures on a regular basis and normal values  Chronic Disease Management support and education needs related to effective management of HTN   BP Readings from Last 3 Encounters:  12/05/21 (!) 140/75  11/29/21 124/78  10/18/21 (!) 164/101     Planned Interventions: Evaluation of current treatment plan related to hypertension self management and patient's adherence to plan as established by provider;   Provided education to patient re: stroke prevention, s/s of heart attack and stroke; Reviewed prescribed diet heart healthy/ADA diet Reviewed medications with patient and discussed importance of compliance. Patient states compliance with medications.     Discussed plans with patient for ongoing care management follow up and provided patient with direct contact information for care management team; Advised patient, providing education and rationale, to monitor blood pressure daily and record, calling PCP for findings outside established parameters. The patient states that his blood pressures are higher now. He has not had to take the medications to raise his blood pressures. Education on systolic goal of <010 and diastolic of <27 Reviewed scheduled/upcoming provider appointments including: 03-01-2022 at 0820 am with the pcp Advised patient to discuss changes in blood pressures or heart health with provider; Provided education on prescribed diet heart healthy/ADA diet;  Discussed complications of poorly controlled blood pressure such as heart disease, stroke, circulatory complications, vision complications, kidney impairment, sexual dysfunction;  Screening for signs and symptoms of depression related to chronic disease state;  Assessed social determinant of health barriers;   Symptom Management: Take medications as prescribed   Attend all scheduled provider appointments Perform all self care activities independently  Call provider office for new concerns or questions  call the Suicide and Crisis Lifeline: 988 call the [place] National Suicide Prevention Lifeline: 506-732-1410 or TTY: 415-189-6070 TTY (678)238-1918) to talk to a trained counselor call 1-800-273-TALK (toll free, 24 hour hotline) if experiencing a Mental  Health or [place]  check blood pressure weekly learn about high blood pressure keep a blood pressure log take blood pressure log to all doctor appointments call doctor for signs and symptoms of high blood pressure develop an action plan for high blood pressure keep all doctor appointments take medications for blood pressure exactly as prescribed report new symptoms to your doctor  Follow Up Plan: Telephone  follow up appointment with care management team member scheduled for: 02-09-2022 at 1 pm          Patient verbalizes understanding of instructions and care plan provided today and agrees to view in [place]. Active MyChart status and patient understanding of how to access instructions and care plan via MyChart confirmed with patient.     Telephone follow up appointment with care management team member scheduled for: 02-09-2022 at 1 pm

## 2021-12-07 NOTE — Chronic Care Management (AMB) (Signed)
Chronic Care Management Provider Comprehensive Care Plan    12/07/2021 Name: KYLLE LALL MRN: [id number] DOB: 01-07-1946  Referral to Chronic Care Management (CCM) services was placed by Provider:  [name] on Date: 11-30-2021.  Chronic Condition 1: AFIB Provider Assessment and Plan   Chronic, stable, followed by cardiology.  Rate control present.  Continue current medication regimen as prescribed by them.  Labs: CMP.  Ablation last on 05/17/20.  Would benefit repeat echo in new year.     Expected Outcome/Goals Addressed This Visit (Provider CCM goals/Provider Assessment and plan   CCM (AFIB)  EXPECTED OUTCOME:  MONITOR, SELF- MANAGE AND REDUCE SYMPTOMS OF AFIB   Symptom Management Condition 1: Take all medications as prescribed Attend all scheduled provider appointments Call provider office for new concerns or questions  call the Suicide and Crisis Lifeline: 988 call the [place] National Suicide Prevention Lifeline: (773)233-2824 or TTY: (714) 658-8572 TTY 7156191429) to talk to a trained counselor call 1-800-273-TALK (toll free, 24 hour hotline) if experiencing a Mental Health or [place]  make a plan to eat healthy keep all lab appointments take medicine as prescribed  Chronic Condition 2: DM Provider Assessment and Plan   Diagnosed on 02/21/21, started on Ozempic, but this was stopped in August 2023 due to poor tolerance.  A1c today trend up some to 6.8%.  His wife and him wish to not start new medication, but focus on diet over next 3 months.  Will consider SGLT2 in future which would offer heart and kidney protection. Glucometer supplies present, recommend he check sugars at least daily fasting.  Labs today.  Return in 3 months.         Relevant Orders    Bayer DCA Hb A1c Waived     Expected Outcome/Goals Addressed This Visit (Provider CCM goals/Provider Assessment and plan   Goal: CCM (DIABETES) EXPECTED OUTCOME: MONITOR, SELF-MANAGE AND REDUCE  SYMPTOMS OF DIABETES   Symptom Management Condition 2: Take all medications as prescribed Attend all scheduled provider appointments Call provider office for new concerns or questions  call the Suicide and Crisis Lifeline: 988 call the [place] National Suicide Prevention Lifeline: 774-092-9582 or TTY: 551-668-9938 [place] 9717929565) to talk to a trained counselor call 1-800-273-TALK (toll free, 24 hour hotline) if experiencing a Mental Health or [place]  check feet daily for cuts, sores or redness trim toenails straight across manage portion size wash and dry feet carefully every day wear comfortable, cotton socks wear comfortable, well-fitting shoes   Chronic Condition 3: HTN Provider Assessment and Plan Chronic, stable.  BP at goal today.  Followed by cardiology.  With orthostatic BP presenting occasionally.  Continue current regimen at this time and have recommended utilizing compression hose at home, agrees to try this.  Continue collaboration with cardiology + neurology and current medication regimen.  Labs: CMP.  Recommend ensuring good fluid intake at home.       Expected Outcome/Goals Addressed This Visit (Provider CCM goals/Provider Assessment and plan   Goal: CCM (HYPERTENSION)  EXPECTED OUTCOME:  MONITOR,SELF- MANAGE AND REDUCE SYMPTOMS OF HYPERTENSION  Symptom Management Condition 3: Take all medications as prescribed Attend all scheduled provider appointments Call provider office for new concerns or questions  call the Suicide and Crisis Lifeline: 988 call the [place] National Suicide Prevention Lifeline: (463)849-6428 or TTY: (864)520-3640 TTY (718)295-3670) to talk to a trained counselor call 1-800-273-TALK (toll free, 24 hour hotline) if experiencing a Mental Health or [place]  check blood pressure  weekly learn about high blood pressure keep a blood pressure log take blood pressure log to all doctor appointments call doctor for signs  and symptoms of high blood pressure keep all doctor appointments take medications for blood pressure exactly as prescribed report new symptoms to your doctor  Problem List Patient Active Problem List   Diagnosis Date Noted   Abdominal pain, chronic, right lower quadrant 12/13/2020   S/P ablation of atrial fibrillation 05/17/2020   OSA (obstructive sleep apnea) 04/16/2020   Secondary hypercoagulable state ([place]) 02/07/2020   Abdominal aortic aneurysm (AAA) ([place]) 02/07/2020   Restless leg syndrome 11/10/2019   PAF (paroxysmal atrial fibrillation) ([place]) 10/14/2019   Pacemaker - MDT 10/14/2019   Morbid obesity ([place]) 07/01/2019   Long-term current use of benzodiazepine 03/26/2019   Stage 3a chronic kidney disease ([place]) 03/26/2019   Type 2 diabetes mellitus with obesity ([place]) 03/26/2019   BPH (benign prostatic hyperplasia) 07/11/2018   Chronic, continuous use of opioids 07/05/2018   Chronic pain syndrome 07/05/2018   Facet syndrome, lumbar 07/05/2018   Lumbar radiculopathy 06/05/2017   Complete heart block ([place]) 02/16/2016   History of total knee replacement, bilateral 08/10/2015   Aortic atherosclerosis ([place]) 05/11/2015   Depression, recurrent ([place]) 10/20/2014   Anxiety 10/20/2014   Chronic orthostatic hypotension 04/17/2013   Hypertension associated with diabetes ([place]) 04/17/2013   Hyperlipidemia associated with type 2 diabetes mellitus ([place]) 04/17/2013   GERD (gastroesophageal reflux disease) 04/17/2013   History of pulmonary embolism 04/17/2013   Advanced care planning/counseling discussion 04/14/2013    Medication Management  Current Outpatient Medications:    acetaminophen (TYLENOL) 650 MG CR tablet, Take 650 mg by mouth every 8 (eight) hours as needed for pain., Disp: , Rfl:    apixaban (ELIQUIS) 5 MG TABS tablet, Take 1 tablet (5 mg total) by mouth 2 (two) times daily., Disp: 180 tablet, Rfl: 3   Ascorbic Acid (VITAMIN C) 1000 MG tablet, Take 1,000 mg by mouth daily.,  Disp: , Rfl:    Blood Glucose Monitoring Suppl (ONETOUCH VERIO) w/Device KIT, Use to check blood sugar 3 times a day and document results, bring to appointments.  Goal is <130 fasting blood sugar and <180 two hours after meals., Disp: 1 kit, Rfl: 0   Cranberry 500 MG CAPS, Take 500 mg by mouth daily., Disp: , Rfl:    fludrocortisone (FLORINEF) 0.1 MG tablet, Take 100 mcg by mouth daily., Disp: , Rfl:    gabapentin (NEURONTIN) 600 MG tablet, TAKE 1 TABLET BY MOUTH EVERYDAY AT BEDTIME, Disp: 90 tablet, Rfl: 4   glucose blood test strip, Use to check blood sugar 3 times a day and document results, bring to appointments.  Goal is <130 fasting blood sugar and <180 two hours after meals., Disp: 100 each, Rfl: 12   [START ON 12/30/2021] HYDROcodone-acetaminophen (NORCO/VICODIN) 5-325 MG tablet, Take 1 tablet by mouth 2 (two) times daily as needed for moderate pain or severe pain., Disp: 60 tablet, Rfl: 0   [START ON 01/28/2022] HYDROcodone-acetaminophen (NORCO/VICODIN) 5-325 MG tablet, Take 1 tablet by mouth 2 (two) times daily as needed for moderate pain or severe pain., Disp: 60 tablet, Rfl: 0   Lancets (ONETOUCH ULTRASOFT) lancets, Use to check blood sugar 3 times a day and document results, bring to appointments.  Goal is <130 fasting blood sugar and <180 two hours after meals., Disp: 100 each, Rfl: 12   LORazepam (ATIVAN) 1 MG tablet, TAKE 1 TABLET BY MOUTH EVERYDAY AT BEDTIME, Disp: 90 tablet, Rfl: 0  Magnesium 400 MG TABS, Take 400 mg by mouth daily. , Disp: , Rfl:    MELATONIN PO, Take by mouth., Disp: , Rfl:    midodrine (PROAMATINE) 10 MG tablet, Take 0.5 tablets (5 mg total) by mouth 3 (three) times daily. (Patient not taking: Reported on 11/29/2021), Disp: 180 tablet, Rfl: 2   Multiple Vitamin (MULTIVITAMIN WITH MINERALS) TABS tablet, Take 1 tablet by mouth daily. Centrum Silver, Disp: , Rfl:    Omega-3 Fatty Acids (FISH OIL) 1200 MG CAPS, Take 1,200 mg by mouth daily., Disp: , Rfl:    oxymetazoline  (AFRIN) 0.05 % nasal spray, Place 2 sprays into both nostrils at bedtime., Disp: , Rfl:    pantoprazole (PROTONIX) 20 MG tablet, Take 1 tablet (20 mg total) by mouth 2 (two) times daily before a meal., Disp: 180 tablet, Rfl: 4   potassium chloride SA (KLOR-CON) 20 MEQ tablet, Take 2 tablets (40 meq) x 1 dose as directed (Patient taking differently: Take 40 mEq by mouth daily as needed (with torsemide (fluid retention)).), Disp: 10 tablet, Rfl: 0   QUEtiapine Fumarate (SEROQUEL XR) 150 MG 24 hr tablet, TAKE 1 TABLET BY MOUTH AT BEDTIME., Disp: 90 tablet, Rfl: 4   rOPINIRole (REQUIP) 0.25 MG tablet, Take 0.25 mg by mouth 4 (four) times daily., Disp: , Rfl:    rosuvastatin (CRESTOR) 40 MG tablet, TAKE 1 TABLET BY MOUTH EVERY DAY, Disp: 90 tablet, Rfl: 0   solifenacin (VESICARE) 10 MG tablet, TAKE 1 TABLET BY MOUTH EVERY DAY, Disp: 90 tablet, Rfl: 0   tiZANidine (ZANAFLEX) 4 MG tablet, Take 1 tablet (4 mg total) by mouth every 6 (six) hours as needed for muscle spasms., Disp: 90 tablet, Rfl: 4   torsemide (DEMADEX) 20 MG tablet, Take 40 mg by mouth daily as needed (fluid retention)., Disp: , Rfl:    venlafaxine XR (EFFEXOR-XR) 75 MG 24 hr capsule, TAKE 3 CAPSULES BY MOUTH DAILY., Disp: 270 capsule, Rfl: 1  Cognitive Assessment Identity Confirmed: : Name; DOB Cognitive Status: Normal   Functional Assessment Hearing Difficulty or Deaf: yes Hearing Management: a little hard of hearing Wear Glasses or Blind: no Concentrating, Remembering or Making Decisions Difficulty (CP): no Difficulty Communicating: no Difficulty Eating/Swallowing: no Walking or Climbing Stairs Difficulty: yes Walking or Climbing Stairs: ambulation difficulty, requires equipment Mobility Management: uses a cane when ambulating Dressing/Bathing Difficulty: no Doing Errands Independently Difficulty (such as shopping) (CP): no Change in Functional Status Since Onset of Current Illness/Injury: no   Caregiver Assessment   Primary Source of Support/Comfort: spouse Name of Support/Comfort Primary Source: [place]vil Martensen People in Home: spouse; child(ren), adult Name(s) of People in Home: [place] and adult son Family Caregiver if Needed: spouse Family Caregiver Names: wife, Berline Chough Primary Roles/Responsibilities: retired   Planned Interventions  Provider order and care plan reviewed. Collaborated with PharmD regarding patient care and plan. Counseled on increased risk of stroke due to Afib and benefits of anticoagulation for stroke prevention           Reviewed importance of adherence to anticoagulant exactly as prescribed Advised patient to discuss changes in heart rate, changes in activity level, acute onset of shortness of breath, or other questions and concerns related to AFIB with provider Counseled on bleeding risk associated with AFIB and importance of self-monitoring for signs/symptoms of bleeding Counseled on avoidance of NSAIDs due to increased bleeding risk with anticoagulants Counseled on importance of regular laboratory monitoring as prescribed Counseled on seeking medical attention after a head injury or  if there is blood in the urine/stool Afib action plan reviewed Screening for signs and symptoms of depression related to chronic disease state Assessed social determinant of health barriers Provided education to patient about basic DM disease process; Reviewed medications with patient and discussed importance of medication adherence;        Reviewed prescribed diet with patient heart healthy/ADA diet ; Counseled on importance of regular laboratory monitoring as prescribed;        Discussed plans with patient for ongoing care management follow up and provided patient with direct contact information for care management team;      Provided patient with written educational materials related to hypo and hyperglycemia and importance of correct treatment;       Reviewed scheduled/upcoming provider  appointments including: 03-01-2022 at 820 am;         Advised patient, providing education and rationale, to check cbg when you have symptoms of low or high blood sugar and as directed   and record        Referral made to pharmacy team for assistance with medication reconciliation, education and support. Works with [place] [facility] on a consistent basis;       Review of patient status, including review of consultants reports, relevant laboratory and other test results, and medications completed;       Advised patient to discuss changes in DM health and well being with provider;      Screening for signs and symptoms of depression related to chronic disease state;        Assessed social determinant of health barriers;        Evaluation of current treatment plan related to hypertension self management and patient's adherence to plan as established by provider;   Provided education to patient re: stroke prevention, s/s of heart attack and stroke; Reviewed prescribed diet heart healthy/ADA diet Reviewed medications with patient and discussed importance of compliance. Patient states compliance with medications.    Discussed plans with patient for ongoing care management follow up and provided patient with direct contact information for care management team; Advised patient, providing education and rationale, to monitor blood pressure daily and record, calling PCP for findings outside established parameters. The patient states that his blood pressures are higher now. He has not had to take the medications to raise his blood pressures. Education on systolic goal of <193 and diastolic of <79 Reviewed scheduled/upcoming provider appointments including: 03-01-2022 at 0820 am with the pcp Advised patient to discuss changes in blood pressures or heart health with provider; Provided education on prescribed diet heart healthy/ADA diet;  Discussed complications of poorly controlled blood pressure such as heart disease, stroke,  circulatory complications, vision complications, kidney impairment, sexual dysfunction;  Screening for signs and symptoms of depression related to chronic disease state;  Assessed social determinant of health barriers  Interaction and coordination with outside resources, practitioners, and providers See CCM Referral  Care Plan: Available in MyChart

## 2021-12-11 NOTE — Progress Notes (Signed)
Subjective:  Patient ID: Nicholas [name], male    DOB: [birth year]  Age: 76 y.o. MRN: [id number]  CC: No chief complaint on file.   Procedure: None  HPI Nicholas [name] presents for reevaluation.  He was last seen back in September and had a previous epidural earlier in the year in February and July.  He generally gets significant improvement in his low back pain and leg pain lasting 3 to 4 months following each epidural rated about 75 to 100% relief.  Recently he has had a slight increase in the low back pain component of his pain requiring a modest increase in hydrocodone.  He presents today stating this has been stable.  He has been able to manage effectively and has recently gone on a few road trips to [place] and was able to tolerate this reasonably well.  He is actually asking to reduce his current hydrocodone dosing regimen if reasonable.  He denies any side effects with his medication and continues to get good relief.  He generally takes a half a tablet 3-4 times a day and continues to derive good functional benefit and lifestyle improvement with his medications.  He has failed more conservative therapy.  He does try to ambulate and does his stretching exercises as tolerated.  No change in lower extremity strength function or bowel or bladder function is noted.  Outpatient Medications Prior to Visit  Medication Sig Dispense Refill   acetaminophen (TYLENOL) 650 MG CR tablet Take 650 mg by mouth every 8 (eight) hours as needed for pain.     apixaban (ELIQUIS) 5 MG TABS tablet Take 1 tablet (5 mg total) by mouth 2 (two) times daily. 180 tablet 3   Ascorbic Acid (VITAMIN C) 1000 MG tablet Take 1,000 mg by mouth daily.     Blood Glucose Monitoring Suppl (ONETOUCH VERIO) w/Device KIT Use to check blood sugar 3 times a day and document results, bring to appointments.  Goal is <130 fasting blood sugar and <180 two hours after meals. 1 kit 0   Cranberry 500 MG CAPS Take 500 mg by mouth daily.      fludrocortisone (FLORINEF) 0.1 MG tablet Take 100 mcg by mouth daily.     gabapentin (NEURONTIN) 600 MG tablet TAKE 1 TABLET BY MOUTH EVERYDAY AT BEDTIME 90 tablet 4   glucose blood test strip Use to check blood sugar 3 times a day and document results, bring to appointments.  Goal is <130 fasting blood sugar and <180 two hours after meals. 100 each 12   Lancets (ONETOUCH ULTRASOFT) lancets Use to check blood sugar 3 times a day and document results, bring to appointments.  Goal is <130 fasting blood sugar and <180 two hours after meals. 100 each 12   LORazepam (ATIVAN) 1 MG tablet TAKE 1 TABLET BY MOUTH EVERYDAY AT BEDTIME 90 tablet 0   Magnesium 400 MG TABS Take 400 mg by mouth daily.      MELATONIN PO Take by mouth.     Multiple Vitamin (MULTIVITAMIN WITH MINERALS) TABS tablet Take 1 tablet by mouth daily. Centrum Silver     Omega-3 Fatty Acids (FISH OIL) 1200 MG CAPS Take 1,200 mg by mouth daily.     oxymetazoline (AFRIN) 0.05 % nasal spray Place 2 sprays into both nostrils at bedtime.     pantoprazole (PROTONIX) 20 MG tablet Take 1 tablet (20 mg total) by mouth 2 (two) times daily before a meal. 180 tablet 4   potassium chloride SA (  KLOR-CON) 20 MEQ tablet Take 2 tablets (40 meq) x 1 dose as directed (Patient taking differently: Take 40 mEq by mouth daily as needed (with torsemide (fluid retention)).) 10 tablet 0   QUEtiapine Fumarate (SEROQUEL XR) 150 MG 24 hr tablet TAKE 1 TABLET BY MOUTH AT BEDTIME. 90 tablet 4   rOPINIRole (REQUIP) 0.25 MG tablet Take 0.25 mg by mouth 4 (four) times daily.     rosuvastatin (CRESTOR) 40 MG tablet TAKE 1 TABLET BY MOUTH EVERY DAY 90 tablet 0   solifenacin (VESICARE) 10 MG tablet TAKE 1 TABLET BY MOUTH EVERY DAY 90 tablet 0   tiZANidine (ZANAFLEX) 4 MG tablet Take 1 tablet (4 mg total) by mouth every 6 (six) hours as needed for muscle spasms. 90 tablet 4   torsemide (DEMADEX) 20 MG tablet Take 40 mg by mouth daily as needed (fluid retention).     venlafaxine  XR (EFFEXOR-XR) 75 MG 24 hr capsule TAKE 3 CAPSULES BY MOUTH DAILY. 270 capsule 1   HYDROcodone-acetaminophen (NORCO/VICODIN) 5-325 MG tablet Take 1 tablet by mouth every 6 (six) hours as needed for moderate pain or severe pain. (Patient taking differently: Take 1 tablet by mouth 2 (two) times daily as needed for moderate pain or severe pain.) 120 tablet 0   midodrine (PROAMATINE) 10 MG tablet Take 0.5 tablets (5 mg total) by mouth 3 (three) times daily. (Patient not taking: Reported on 11/29/2021) 180 tablet 2   No facility-administered medications prior to visit.    Review of Systems CNS: No confusion or sedation Cardiac: No angina or palpitations GI: No abdominal pain or constipation Constitutional: No nausea vomiting fevers or chills  Objective:  BP (!) 140/75   Pulse 77   Temp 98.1 F (36.7 C)   Resp 18   Ht _0  (1.778 m)   Wt 283 lb (128.4 kg)   SpO2 97%   BMI 40.61 kg/m    BP Readings from Last 3 Encounters:  12/05/21 (!) 140/75  11/29/21 124/78  10/18/21 (!) 164/101     Wt Readings from Last 3 Encounters:  12/05/21 283 lb (128.4 kg)  11/29/21 288 lb (130.6 kg)  10/18/21 288 lb (130.6 kg)     Physical Exam Pt is alert and oriented PERRL EOMI HEART IS RRR no murmur or rub LCTA no wheezing or rales MUSCULOSKELETAL reveals some paraspinous muscle tenderness in the lumbar region but no overt trigger points.  He has an antalgic gait.  He has mild difficulty going from seated to standing but overall no change on examination is noted.  Labs  Lab Results  Component Value Date   HGBA1C 6.8 (H) 11/29/2021   HGBA1C 5.9 (H) 08/24/2021   HGBA1C 5.6 05/23/2021   Lab Results  Component Value Date   MICROALBUR 80 (H) 03/23/2021   LDLCALC 63 11/29/2021   CREATININE 1.14 11/29/2021    -------------------------------------------------------------------------------------------------------------------- Lab Results  Component Value Date   WBC 6.3 03/23/2021   HGB  15.1 03/23/2021   HCT 46.0 03/23/2021   PLT 172 03/23/2021   GLUCOSE 144 (H) 11/29/2021   CHOL 135 11/29/2021   TRIG 129 11/29/2021   HDL 49 11/29/2021   LDLCALC 63 11/29/2021   ALT 13 11/29/2021   AST 29 11/29/2021   NA 140 11/29/2021   K 4.3 11/29/2021   CL 100 11/29/2021   CREATININE 1.14 11/29/2021   BUN 23 11/29/2021   CO2 24 11/29/2021   TSH 1.420 02/21/2021   INR 1.2 02/16/2014   HGBA1C 6.8 (  H) 11/29/2021   MICROALBUR 80 (H) 03/23/2021    --------------------------------------------------------------------------------------------------------------------- NM Bone Scan 3 Phase  Result Date: 11/23/2021 CLINICAL DATA:  RIGHT hip pain for 3 months, post BILATERAL hip arthroplasty, 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 No abnormal tracer uptake is seen adjacent to the hip prostheses to suggest aseptic loosening or infection. No abnormal pelvic localization of tracer. IMPRESSION: No scintigraphic abnormalities of the hip prostheses identified. Electronically Signed   By: MLavonia DanaM.D.   On: 11/23/2021 12:48     Assessment & Plan:   There are no diagnoses linked to this encounter.      ----------------------------------------------------------------------------------------------------------------------  Problem List Items Addressed This Visit    None     ----------------------------------------------------------------------------------------------------------------------     ----------------------------------------------------------------------------------------------------------------------  I have changed Preet T. Komperda "Tommy"'s HYDROcodone-acetaminophen. I am also having him start on HYDROcodone-acetaminophen. Additionally, I am having him maintain his oxymetazoline, Fish Oil, Cranberry, Magnesium, acetaminophen, potassium chloride SA, torsemide, rOPINIRole, vitamin C, multivitamin with minerals, MELATONIN PO, tiZANidine, OneTouch Verio, onetouch ultrasoft, glucose blood, QUEtiapine Fumarate, fludrocortisone, gabapentin, midodrine, pantoprazole, apixaban, LORazepam, venlafaxine XR, rosuvastatin, and solifenacin.   Meds ordered this encounter  Medications   HYDROcodone-acetaminophen (NORCO/VICODIN) 5-325 MG tablet    Sig: Take 1 tablet by mouth 2 (two) times daily as needed for moderate pain or severe pain.    Dispense:  60 tablet    Refill:  0   HYDROcodone-acetaminophen (NORCO/VICODIN) 5-325 MG tablet    Sig: Take 1 tablet by mouth 2 (two) times daily as needed for moderate pain or severe pain.    Dispense:  60 tablet    Refill:  0   Patient's Medications  New Prescriptions   HYDROCODONE-ACETAMINOPHEN (NORCO/VICODIN) 5-325 MG TABLET    Take 1 tablet by mouth 2 (two) times daily as needed for moderate pain or severe pain.  Previous Medications   ACETAMINOPHEN (TYLENOL) 650 MG CR TABLET    Take 650 mg by mouth every 8 (eight) hours as needed for pain.   APIXABAN (ELIQUIS) 5 MG TABS TABLET    Take 1 tablet (5 mg total) by mouth 2 (two) times daily.   ASCORBIC ACID (VITAMIN C) 1000 MG TABLET    Take 1,000 mg by mouth daily.   BLOOD GLUCOSE MONITORING SUPPL (ONETOUCH VERIO) W/DEVICE KIT    Use to check blood sugar 3 times a day and document results, bring to appointments.  Goal is <130 fasting blood sugar and <180 two  hours after meals.   CRANBERRY 500 MG CAPS    Take 500 mg by mouth daily.   FLUDROCORTISONE (FLORINEF) 0.1 MG TABLET    Take 100 mcg by mouth daily.   GABAPENTIN (NEURONTIN) 600 MG TABLET    TAKE 1 TABLET BY MOUTH EVERYDAY AT BEDTIME   GLUCOSE BLOOD TEST STRIP    Use to check blood sugar 3 times a day and document results, bring to appointments.  Goal is <130 fasting blood sugar and <180 two hours after meals.   LANCETS (ONETOUCH ULTRASOFT) LANCETS    Use to check blood sugar  3 times a day and document results, bring to appointments.  Goal is <130 fasting blood sugar and <180 two hours after meals.   LORAZEPAM (ATIVAN) 1 MG TABLET    TAKE 1 TABLET BY MOUTH EVERYDAY AT BEDTIME   MAGNESIUM 400 MG TABS    Take 400 mg by mouth daily.    MELATONIN PO    Take by mouth.   MIDODRINE (PROAMATINE) 10 MG TABLET    Take 0.5 tablets (5 mg total) by mouth 3 (three) times daily.   MULTIPLE VITAMIN (MULTIVITAMIN WITH MINERALS) TABS TABLET    Take 1 tablet by mouth daily. Centrum Silver   OMEGA-3 FATTY ACIDS (FISH OIL) 1200 MG CAPS    Take 1,200 mg by mouth daily.   OXYMETAZOLINE (AFRIN) 0.05 % NASAL SPRAY    Place 2 sprays into both nostrils at bedtime.   PANTOPRAZOLE (PROTONIX) 20 MG TABLET    Take 1 tablet (20 mg total) by mouth 2 (two) times daily before a meal.   POTASSIUM CHLORIDE SA (KLOR-CON) 20 MEQ TABLET    Take 2 tablets (40 meq) x 1 dose as directed   QUETIAPINE FUMARATE (SEROQUEL XR) 150 MG 24 HR TABLET    TAKE 1 TABLET BY MOUTH AT BEDTIME.   ROPINIROLE (REQUIP) 0.25 MG TABLET    Take 0.25 mg by mouth 4 (four) times daily.   ROSUVASTATIN (CRESTOR) 40 MG TABLET    TAKE 1 TABLET BY MOUTH EVERY DAY   SOLIFENACIN (VESICARE) 10 MG TABLET    TAKE 1 TABLET BY MOUTH EVERY DAY   TIZANIDINE (ZANAFLEX) 4 MG TABLET    Take 1 tablet (4 mg total) by mouth every 6 (six) hours as needed for muscle spasms.   TORSEMIDE (DEMADEX) 20 MG TABLET    Take 40 mg by mouth daily as needed (fluid retention).   VENLAFAXINE XR  (EFFEXOR-XR) 75 MG 24 HR CAPSULE    TAKE 3 CAPSULES BY MOUTH DAILY.  Modified Medications   Modified Medication Previous Medication   HYDROCODONE-ACETAMINOPHEN (NORCO/VICODIN) 5-325 MG TABLET HYDROcodone-acetaminophen (NORCO/VICODIN) 5-325 MG tablet      Take 1 tablet by mouth 2 (two) times daily as needed for moderate pain or severe pain.    Take 1 tablet by mouth every 6 (six) hours as needed for moderate pain or severe pain.  Discontinued Medications   No medications on file   ----------------------------------------------------------------------------------------------------------------------  Follow-up: Return in about 2 months (around 02/04/2022) for evaluation, med refill.  1. Chronic pain syndrome   2. Lumbar radiculopathy   3. Degeneration of lumbar intervertebral disc   4. Bilateral sciatica   5. Chronic, continuous use of opioids   6. Bilateral hand pain   7. Facet syndrome, lumbar   8. Osteoarthritis of right hip, unspecified osteoarthritis type   9. Spasm of muscle of lower back   10. Abdominal pain, chronic, right lower quadrant   Based on clinical findings and conversation today I think it is reasonable to reduce his hydrocodone dosing.  Fortunately he does get good relief with this medication and shows no evidence of any illicit or diverting use.  He gets good relief and has been able to control medication utilization effectively.  We have talked about continued stretching strengthening exercises and continue playing golf as tolerated.  We will schedule him for repeat epidural here within the next few months if necessary to help with the sciatica.  He does very well with these and they enable him to stay active and functional and  keep his sciatica symptoms under control.  Continue follow-up with his primary care physicians for baseline medical care.  [name], MD

## 2021-12-12 ENCOUNTER — Ambulatory Visit (INDEPENDENT_AMBULATORY_CARE_PROVIDER_SITE_OTHER): Payer: Medicare Other

## 2021-12-12 DIAGNOSIS — I442 Atrioventricular block, complete: Secondary | ICD-10-CM | POA: Diagnosis not present

## 2021-12-13 LAB — CUP PACEART REMOTE DEVICE CHECK
Battery Remaining Longevity: 23 mo
Battery Voltage: 2.94 V
Brady Statistic AP VP Percent: 19.73 %
Brady Statistic AP VS Percent: 0 %
Brady Statistic AS VP Percent: 80.23 %
Brady Statistic AS VS Percent: 0.04 %
Brady Statistic RA Percent Paced: 19.69 %
Brady Statistic RV Percent Paced: 99.95 %
Date Time Interrogation Session: 20231120100206
Implantable Lead Connection Status: 753985
Implantable Lead Connection Status: 753985
Implantable Lead Implant Date: 20180124
Implantable Lead Implant Date: 20180124
Implantable Lead Location: 753859
Implantable Lead Location: 753860
Implantable Lead Model: 3830
Implantable Lead Model: 5076
Implantable Pulse Generator Implant Date: 20180124
Lead Channel Impedance Value: 323 Ohm
Lead Channel Impedance Value: 342 Ohm
Lead Channel Impedance Value: 380 Ohm
Lead Channel Impedance Value: 456 Ohm
Lead Channel Pacing Threshold Amplitude: 0.5 V
Lead Channel Pacing Threshold Amplitude: 0.75 V
Lead Channel Pacing Threshold Pulse Width: 0.4 ms
Lead Channel Pacing Threshold Pulse Width: 0.4 ms
Lead Channel Sensing Intrinsic Amplitude: 1.625 mV
Lead Channel Sensing Intrinsic Amplitude: 1.625 mV
Lead Channel Sensing Intrinsic Amplitude: 3 mV
Lead Channel Sensing Intrinsic Amplitude: 3 mV
Lead Channel Setting Pacing Amplitude: 2 V
Lead Channel Setting Pacing Amplitude: 2.25 V
Lead Channel Setting Pacing Pulse Width: 1 ms
Lead Channel Setting Sensing Sensitivity: 0.6 mV
Zone Setting Status: 755011
Zone Setting Status: 755011

## 2021-12-20 ENCOUNTER — Ambulatory Visit: Payer: Medicare Other | Admitting: Anesthesiology

## 2021-12-22 DIAGNOSIS — I4891 Unspecified atrial fibrillation: Secondary | ICD-10-CM | POA: Diagnosis not present

## 2021-12-22 DIAGNOSIS — I10 Essential (primary) hypertension: Secondary | ICD-10-CM | POA: Diagnosis not present

## 2021-12-22 DIAGNOSIS — E1159 Type 2 diabetes mellitus with other circulatory complications: Secondary | ICD-10-CM

## 2021-12-31 ENCOUNTER — Other Ambulatory Visit: Payer: Self-pay | Admitting: Nurse Practitioner

## 2022-01-02 NOTE — Telephone Encounter (Signed)
Requested Prescriptions  Pending Prescriptions Disp Refills   rosuvastatin (CRESTOR) 40 MG tablet [Pharmacy Med Name: ROSUVASTATIN CALCIUM 40 MG TAB] 90 tablet 0    Sig: TAKE 1 TABLET BY MOUTH EVERY DAY     Cardiovascular:  Antilipid - Statins 2 Failed - 12/31/2021 10:37 AM      Failed - Lipid Panel in normal range within the last 12 months    Cholesterol, Total  Date Value Ref Range Status  11/29/2021 135 100 - 199 mg/dL Final   Cholesterol Piccolo, Waived  Date Value Ref Range Status  01/03/2018 198 <200 mg/dL Final    Comment:                            Desirable                <200                         Borderline High      200- 239                         High                     >239    LDL Chol Calc (NIH)  Date Value Ref Range Status  11/29/2021 63 0 - 99 mg/dL Final   HDL  Date Value Ref Range Status  11/29/2021 49 >39 mg/dL Final   Triglycerides  Date Value Ref Range Status  11/29/2021 129 0 - 149 mg/dL Final   Triglycerides Piccolo,Waived  Date Value Ref Range Status  01/03/2018 264 (H) <150 mg/dL Final    Comment:                            Normal                   <150                         Borderline High     150 - 199                         High                200 - 499                         Very High                >499          Passed - Cr in normal range and within 360 days    Creatinine  Date Value Ref Range Status  02/26/2014 0.94 0.60 - 1.30 mg/dL Final   Creatinine, Ser  Date Value Ref Range Status  11/29/2021 1.14 0.76 - 1.27 mg/dL Final         Passed - Patient is not pregnant      Passed - Valid encounter within last 12 months    Recent Outpatient Visits           1 month ago Type 2 diabetes mellitus with obesity ([place])   [place], Jolene T, NP   4 months ago Type 2 diabetes mellitus with obesity ([place])  [facility] [place], [place] T, NP   7 months ago Acute cough   [facility] [place], Sullivan T, NP   7 months ago Type 2 diabetes mellitus with obesity ([place])   [place], Jolene T, NP   9 months ago Abdominal pain, chronic, right lower quadrant   [place], Barbaraann Faster, NP       Future Appointments             In 1 month Cann[place], Barbaraann Faster, NP [employer] [place]   In 2 months Hollice Espy, MD [place]

## 2022-01-17 ENCOUNTER — Telehealth: Payer: Self-pay

## 2022-01-17 NOTE — Telephone Encounter (Signed)
Paperwork faxed back to [employer]  

## 2022-01-20 NOTE — Progress Notes (Signed)
Remote pacemaker transmission.   

## 2022-02-08 ENCOUNTER — Encounter: Payer: Self-pay | Admitting: Anesthesiology

## 2022-02-08 ENCOUNTER — Ambulatory Visit: Payer: Medicare Other | Attending: Anesthesiology | Admitting: Anesthesiology

## 2022-02-08 DIAGNOSIS — M5416 Radiculopathy, lumbar region: Secondary | ICD-10-CM | POA: Diagnosis not present

## 2022-02-08 DIAGNOSIS — M47817 Spondylosis without myelopathy or radiculopathy, lumbosacral region: Secondary | ICD-10-CM

## 2022-02-08 DIAGNOSIS — M47816 Spondylosis without myelopathy or radiculopathy, lumbar region: Secondary | ICD-10-CM

## 2022-02-08 DIAGNOSIS — M5136 Other intervertebral disc degeneration, lumbar region: Secondary | ICD-10-CM | POA: Diagnosis not present

## 2022-02-08 DIAGNOSIS — M5431 Sciatica, right side: Secondary | ICD-10-CM | POA: Diagnosis not present

## 2022-02-08 DIAGNOSIS — M79642 Pain in left hand: Secondary | ICD-10-CM

## 2022-02-08 DIAGNOSIS — F1190 Opioid use, unspecified, uncomplicated: Secondary | ICD-10-CM

## 2022-02-08 DIAGNOSIS — M62830 Muscle spasm of back: Secondary | ICD-10-CM

## 2022-02-08 DIAGNOSIS — M5432 Sciatica, left side: Secondary | ICD-10-CM

## 2022-02-08 DIAGNOSIS — G894 Chronic pain syndrome: Secondary | ICD-10-CM

## 2022-02-08 DIAGNOSIS — M1611 Unilateral primary osteoarthritis, right hip: Secondary | ICD-10-CM

## 2022-02-08 DIAGNOSIS — M79641 Pain in right hand: Secondary | ICD-10-CM

## 2022-02-08 MED ORDER — HYDROCODONE-ACETAMINOPHEN 5-325 MG PO TABS: 1.0000 | ORAL_TABLET | Freq: Four times a day (QID) | ORAL | 0 refills | Status: DC | PRN

## 2022-02-08 MED ORDER — HYDROCODONE-ACETAMINOPHEN 5-325 MG PO TABS: 1.0000 | ORAL_TABLET | Freq: Two times a day (BID) | ORAL | 0 refills | Status: AC | PRN

## 2022-02-08 NOTE — Progress Notes (Addendum)
Virtual Visit via Telephone Note  I connected with Nicholas [name] on 02/08/22 at  2:20 PM EST by telephone and verified that I am speaking with the correct person using two identifiers.  Location: Patient: Home Provider: Pain control center   I discussed the limitations, risks, security and privacy concerns of performing an evaluation and management service by telephone and the availability of in person appointments. I also discussed with the patient that there may be a patient responsible charge related to this service. The patient expressed understanding and agreed to proceed.   History of Present Illness: I spoke with Nicholas [name] today via telephone as we were unable link for the video portion of the conference.  He reports that his low back pain and hip pain have been stable in nature with no recent changes.  The quality characteristic and distribution pain remain unchanged and he primarily has an aching gnawing pain worse in the morning radiating from the low back into the hips buttocks and down the posterior legs.  The hip pain has been less of a problem recently.  He is unsure why.  He is try to stay active and has been doing his physical therapy stretching strengthening exercises.  He takes his hydrocodone twice a day and generally gets about 70% improvement for about 4 to 6 hours before he has recurrence of the same pain.  Otherwise he is in his usual state of health.  Review of systems: General: No fevers or chills Pulmonary: No shortness of breath or dyspnea Cardiac: No angina or palpitations or lightheadedness GI: No abdominal pain or constipation Psych: No depression    Observations/Objective:  Current Outpatient Medications:    acetaminophen (TYLENOL) 650 MG CR tablet, Take 650 mg by mouth every 8 (eight) hours as needed for pain., Disp: , Rfl:    apixaban (ELIQUIS) 5 MG TABS tablet, Take 1 tablet (5 mg total) by mouth 2 (two) times daily., Disp: 180 tablet, Rfl: 3   Ascorbic  Acid (VITAMIN C) 1000 MG tablet, Take 1,000 mg by mouth daily., Disp: , Rfl:    Blood Glucose Monitoring Suppl (ONETOUCH VERIO) w/Device KIT, Use to check blood sugar 3 times a day and document results, bring to appointments.  Goal is <130 fasting blood sugar and <180 two hours after meals., Disp: 1 kit, Rfl: 0   Cranberry 500 MG CAPS, Take 500 mg by mouth daily., Disp: , Rfl:    fludrocortisone (FLORINEF) 0.1 MG tablet, Take 100 mcg by mouth daily., Disp: , Rfl:    gabapentin (NEURONTIN) 600 MG tablet, TAKE 1 TABLET BY MOUTH EVERYDAY AT BEDTIME, Disp: 90 tablet, Rfl: 4   glucose blood test strip, Use to check blood sugar 3 times a day and document results, bring to appointments.  Goal is <130 fasting blood sugar and <180 two hours after meals., Disp: 100 each, Rfl: 12   [START ON 03/02/2022] HYDROcodone-acetaminophen (NORCO/VICODIN) 5-325 MG tablet, Take 1 tablet by mouth 2 (two) times daily as needed for moderate pain or severe pain., Disp: 60 tablet, Rfl: 0   [START ON 04/01/2022] HYDROcodone-acetaminophen (NORCO/VICODIN) 5-325 MG tablet, Take 1 tablet by mouth every 6 (six) hours as needed for moderate pain or severe pain., Disp: 60 tablet, Rfl: 0   Lancets (ONETOUCH ULTRASOFT) lancets, Use to check blood sugar 3 times a day and document results, bring to appointments.  Goal is <130 fasting blood sugar and <180 two hours after meals., Disp: 100 each, Rfl: 12   LORazepam (ATIVAN)  1 MG tablet, TAKE 1 TABLET BY MOUTH EVERYDAY AT BEDTIME, Disp: 90 tablet, Rfl: 0   Magnesium 400 MG TABS, Take 400 mg by mouth daily. , Disp: , Rfl:    MELATONIN PO, Take by mouth., Disp: , Rfl:    midodrine (PROAMATINE) 10 MG tablet, Take 0.5 tablets (5 mg total) by mouth 3 (three) times daily. (Patient not taking: Reported on 11/29/2021), Disp: 180 tablet, Rfl: 2   Multiple Vitamin (MULTIVITAMIN WITH MINERALS) TABS tablet, Take 1 tablet by mouth daily. Centrum Silver, Disp: , Rfl:    Omega-3 Fatty Acids (FISH OIL) 1200 MG  CAPS, Take 1,200 mg by mouth daily., Disp: , Rfl:    oxymetazoline (AFRIN) 0.05 % nasal spray, Place 2 sprays into both nostrils at bedtime., Disp: , Rfl:    pantoprazole (PROTONIX) 20 MG tablet, Take 1 tablet (20 mg total) by mouth 2 (two) times daily before a meal., Disp: 180 tablet, Rfl: 4   potassium chloride SA (KLOR-CON) 20 MEQ tablet, Take 2 tablets (40 meq) x 1 dose as directed (Patient taking differently: Take 40 mEq by mouth daily as needed (with torsemide (fluid retention)).), Disp: 10 tablet, Rfl: 0   QUEtiapine Fumarate (SEROQUEL XR) 150 MG 24 hr tablet, TAKE 1 TABLET BY MOUTH AT BEDTIME., Disp: 90 tablet, Rfl: 4   rOPINIRole (REQUIP) 0.25 MG tablet, Take 0.25 mg by mouth 4 (four) times daily., Disp: , Rfl:    rosuvastatin (CRESTOR) 40 MG tablet, TAKE 1 TABLET BY MOUTH EVERY DAY, Disp: 90 tablet, Rfl: 0   solifenacin (VESICARE) 10 MG tablet, TAKE 1 TABLET BY MOUTH EVERY DAY, Disp: 90 tablet, Rfl: 0   tiZANidine (ZANAFLEX) 4 MG tablet, Take 1 tablet (4 mg total) by mouth every 6 (six) hours as needed for muscle spasms., Disp: 90 tablet, Rfl: 4   torsemide (DEMADEX) 20 MG tablet, Take 40 mg by mouth daily as needed (fluid retention)., Disp: , Rfl:    venlafaxine XR (EFFEXOR-XR) 75 MG 24 hr capsule, TAKE 3 CAPSULES BY MOUTH DAILY., Disp: 270 capsule, Rfl: 1   Past Medical History:  Diagnosis Date   Abdominal pain, chronic, right lower quadrant 12/13/2020   Anterior urethral stricture    Anxiety    Arthritis    a. knees, hips, hands;  b. 11/2013 s/p L TKA @ [place].   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  Chronic pain syndrome   2. Lumbar radiculopathy   3. Degeneration of lumbar intervertebral disc   4. Bilateral sciatica   5. Chronic, continuous use of opioids   6. Bilateral hand pain   7. Facet syndrome, lumbar   8. Osteoarthritis of right hip, unspecified osteoarthritis type   9. Spasm of muscle of lower back   10. Facet arthritis of lumbosacral region   Based on her discussion today I think is appropriate to refill his medications.  I have reviewed the [facility] practitioner database information is appropriate.  He seems to be doing well with the current  regimen with no side effects reported.  He is getting good relief and this is enable him to stay active and sleep better at night.  I want him to continue with his physical therapy exercises as discussed and hopefully he will be able to play some golf this spring.  Continue follow-up with his primary care physician for baseline medical care with return to clinic in 2 months.    Follow Up Instructions:    I discussed the assessment and treatment plan with the patient. The patient was provided an opportunity to ask questions and all were answered. The patient agreed with the plan and demonstrated an understanding of the instructions.   The patient was advised to call back or seek an in-person evaluation if the symptoms worsen or if the condition fails to improve as anticipated.  I provided 30  Minutes of non-face-to-face time during this encounter.   [name], MD

## 2022-02-09 ENCOUNTER — Telehealth: Payer: Medicare Other

## 2022-02-09 ENCOUNTER — Ambulatory Visit (INDEPENDENT_AMBULATORY_CARE_PROVIDER_SITE_OTHER): Payer: Medicare Other

## 2022-02-09 DIAGNOSIS — E1169 Type 2 diabetes mellitus with other specified complication: Secondary | ICD-10-CM

## 2022-02-09 DIAGNOSIS — I480 Paroxysmal atrial fibrillation: Secondary | ICD-10-CM

## 2022-02-09 DIAGNOSIS — I152 Hypertension secondary to endocrine disorders: Secondary | ICD-10-CM

## 2022-02-09 NOTE — Patient Instructions (Signed)
Please call the care guide team at [phone] if you need to cancel or reschedule your appointment.   If you are experiencing a Mental Health or [place] or need someone to talk to, please call the Suicide and Crisis Lifeline: 988 call the [place] National Suicide Prevention Lifeline: 667-119-9514 or TTY: 416-491-8508 TTY (734)131-1991) to talk to a trained counselor call 1-800-273-TALK (toll free, 24 hour hotline)   Following is a copy of the CCM Program Consent:  CCM service includes personalized support from designated clinical staff supervised by the physician, including individualized plan of care and coordination with other care providers 24/7 contact phone numbers for assistance for urgent and routine care needs. Service will only be billed when office clinical staff spend 20 minutes or more in a month to coordinate care. Only one practitioner may furnish and bill the service in a calendar month. The patient may stop CCM services at amy time (effective at the end of the month) by phone call to the office staff. The patient will be responsible for cost sharing (co-pay) or up to 20% of the service fee (after annual deductible is met)  Following is a copy of your full provider care plan:   Goals Addressed             This Visit's Progress    CCM (HTN) EXPECTED OUTCOME: MONITOR, SELF-MANAGE AND REDUCE SYMPTOMS OF HTN       Current Barriers:  Knowledge Deficits related to benefits of taking blood pressures on a regular basis and normal values  Chronic Disease Management support and education needs related to effective management of HTN   BP Readings from Last 3 Encounters:  12/05/21 (!) 140/75  11/29/21 124/78  10/18/21 (!) 164/101     Planned Interventions: Evaluation of current treatment plan related to hypertension self management and patient's adherence to plan as established by provider. The patient denies any issues with HTN or heart health. States he is  doing well. Has upcoming appointments with pcp and does not see cardiologist, Dr. Jens Som for another year;   Provided education to patient re: stroke prevention, s/s of heart attack and stroke; Reviewed prescribed diet heart healthy/ADA diet. The patient is compliant with a heart healthy/ADA diet. Denies any needs related to dietary restrictions Reviewed medications with patient and discussed importance of compliance. Patient states compliance with medications. Works with pharm D for ongoing support and education.    Discussed plans with patient for ongoing care management follow up and provided patient with direct contact information for care management team; Advised patient, providing education and rationale, to monitor blood pressure daily and record, calling PCP for findings outside established parameters. The patient states that his blood pressures are higher now. He has not had to take the medications to raise his blood pressures. Education on systolic goal of <829 and diastolic of <93. Denies any issues with hypotension at this time. Has had to take medications in the past Reviewed scheduled/upcoming provider appointments including: 03-01-2022 at 0820 am with the pcp- reminder given today Advised patient to discuss changes in blood pressures or heart health with provider; Provided education on prescribed diet heart healthy/ADA diet;  Discussed complications of poorly controlled blood pressure such as heart disease, stroke, circulatory complications, vision complications, kidney impairment, sexual dysfunction;  Screening for signs and symptoms of depression related to chronic disease state;  Assessed social determinant of health barriers;   Symptom Management: Take medications as prescribed   Attend all scheduled provider appointments Perform  all self care activities independently  Call provider office for new concerns or questions  call the Suicide and Crisis Lifeline: 988 call the [place]  National Suicide Prevention Lifeline: 279-163-9740 or TTY: (669) 694-2680 TTY 952-101-0878) to talk to a trained counselor call 1-800-273-TALK (toll free, 24 hour hotline) if experiencing a Mental Health or [place]  check blood pressure weekly learn about high blood pressure keep a blood pressure log take blood pressure log to all doctor appointments call doctor for signs and symptoms of high blood pressure develop an action plan for high blood pressure keep all doctor appointments take medications for blood pressure exactly as prescribed report new symptoms to your doctor  Follow Up Plan: Telephone follow up appointment with care management team member scheduled for: 04-06-2022 at 1 pm       CCM Expected Outcome:  Monitor, Self-Manage and Reduce Symptoms of Afib       Current Barriers:  Knowledge Deficits related to how to tell if his heart rate is out of rhythm  with condition of AFIB Chronic Disease Management support and education needs related to effective management of AFIB  Planned Interventions: Provider order and care plan reviewed. Collaborated with PharmD regarding patient care and plan. Takes Eliquis '5mg'$  BID. Works with pharm D for ongoing support and education Counseled on increased risk of stroke due to Afib and benefits of anticoagulation for stroke prevention           Reviewed importance of adherence to anticoagulant exactly as prescribed. The patient is compliant with medications. Advised patient to discuss changes in heart rate, changes in activity level, acute onset of shortness of breath, or other questions and concerns related to AFIB with provider Counseled on bleeding risk associated with AFIB and importance of self-monitoring for signs/symptoms of bleeding Counseled on avoidance of NSAIDs due to increased bleeding risk with anticoagulants Counseled on importance of regular laboratory monitoring as prescribed. The patient has lab work as requested  by providers.  Counseled on seeking medical attention after a head injury or if there is blood in the urine/stool Afib action plan reviewed. The patient had an ablation 05-17-2020. Denies any acute changes in his AFIB or heart health. Saw cardiologist and does not have to see cardiologist again for a year unless needed before then. The patient states he is doing well and staying in because of the cold weather. Has upcoming follow up with the pcp, reminder given  today.  Screening for signs and symptoms of depression related to chronic disease state Assessed social determinant of health barriers  Symptom Management: Take medications as prescribed   Attend all scheduled provider appointments Call provider office for new concerns or questions  call the Suicide and Crisis Lifeline: 988 call the [place] National Suicide Prevention Lifeline: 815-807-7237 or TTY: 423-726-7035 TTY 4125885499) to talk to a trained counselor call 1-800-273-TALK (toll free, 24 hour hotline) if experiencing a Mental Health or [place]  - make a plan to eat healthy - keep all lab appointments - take medicine as prescribed  Follow Up Plan: Telephone follow up appointment with care management team member scheduled for: 04-06-2022 at 1 pm       CCM Expected Outcome:  Monitor, Self-Manage and Reduce Symptoms of Diabetes       Current Barriers:  Knowledge Deficits related to monitoring for changes in blood sugars and the effect of elevated blood sugars on other body systems Chronic Disease Management support and education needs related to effective management  of DM Lab Results  Component Value Date   HGBA1C 6.8 (H) 11/29/2021     Planned Interventions: Provided education to patient about basic DM disease process; Reviewed medications with patient and discussed importance of medication adherence. The patient is compliant with medications. Did not tolerate Ozempic. Is currently controlling DM with diet.          Reviewed prescribed diet with patient heart healthy/ADA diet. Patient is mindful of dietary restrictions. The patient denies any issues with dietary restrictions ; Counseled on importance of regular laboratory monitoring as prescribed. Has regular lab testing;        Discussed plans with patient for ongoing care management follow up and provided patient with direct contact information for care management team;      Provided patient with written educational materials related to hypo and hyperglycemia and importance of correct treatment;       Reviewed scheduled/upcoming provider appointments including: 03-01-2022 at 34 am. Reminder given today         Advised patient, providing education and rationale, to check cbg when you have symptoms of low or high blood sugar and as directed   and record        Referral made to pharmacy team for assistance with medication reconciliation, education and support. Works with [place] D on a consistent basis;       Review of patient status, including review of consultants reports, relevant laboratory and other test results, and medications completed;       Advised patient to discuss changes in DM health and well being with provider;      Screening for signs and symptoms of depression related to chronic disease state;        Assessed social determinant of health barriers;       Eye exam completed today (02-09-2022). Denies any concerns with vision and states he received a good report.    Symptom Management: Take medications as prescribed   Attend all scheduled provider appointments Call provider office for new concerns or questions  call the Suicide and Crisis Lifeline: 988 call the [place] National Suicide Prevention Lifeline: 513-451-2330 or TTY: 340-266-6734 TTY 2700153234) to talk to a trained counselor call 1-800-273-TALK (toll free, 24 hour hotline) if experiencing a Mental Health or [place]  check feet daily for cuts, sores or  redness trim toenails straight across manage portion size wash and dry feet carefully every day wear comfortable, cotton socks wear comfortable, well-fitting shoes  Follow Up Plan: Telephone follow up appointment with care management team member scheduled for: 04-06-2022 at 1 pm          Patient verbalizes understanding of instructions and care plan provided today and agrees to view in [place]. Active MyChart status and patient understanding of how to access instructions and care plan via MyChart confirmed with patient.     Telephone follow up appointment with care management team member scheduled for: 04-06-2022 at 1 pm

## 2022-02-09 NOTE — Chronic Care Management (AMB) (Signed)
Chronic Care Management   CCM RN Visit Note  02/09/2022 Name: Nicholas [name] MRN: [id number] DOB: 07-21-1945  Subjective: Nicholas [name] is a 77 y.o. year old male who is a primary care patient of Cannady, Barbaraann Faster, NP. The patient was referred to the Chronic Care Management team for assistance with care management needs subsequent to provider initiation of CCM services and plan of care.    Today's Visit:  Engaged with patient by telephone for follow up visit.        Goals Addressed             This Visit's Progress    CCM (HTN) EXPECTED OUTCOME: MONITOR, SELF-MANAGE AND REDUCE SYMPTOMS OF HTN       Current Barriers:  Knowledge Deficits related to benefits of taking blood pressures on a regular basis and normal values  Chronic Disease Management support and education needs related to effective management of HTN   BP Readings from Last 3 Encounters:  12/05/21 (!) 140/75  11/29/21 124/78  10/18/21 (!) 164/101     Planned Interventions: Evaluation of current treatment plan related to hypertension self management and patient's adherence to plan as established by provider. The patient denies any issues with HTN or heart health. States he is doing well. Has upcoming appointments with pcp and does not see cardiologist, Dr. Jens Som for another year;   Provided education to patient re: stroke prevention, s/s of heart attack and stroke; Reviewed prescribed diet heart healthy/ADA diet. The patient is compliant with a heart healthy/ADA diet. Denies any needs related to dietary restrictions Reviewed medications with patient and discussed importance of compliance. Patient states compliance with medications. Works with pharm D for ongoing support and education.    Discussed plans with patient for ongoing care management follow up and provided patient with direct contact information for care management team; Advised patient, providing education and rationale, to monitor blood pressure daily and  record, calling PCP for findings outside established parameters. The patient states that his blood pressures are higher now. He has not had to take the medications to raise his blood pressures. Education on systolic goal of <086 and diastolic of <57. Denies any issues with hypotension at this time. Has had to take medications in the past Reviewed scheduled/upcoming provider appointments including: 03-01-2022 at 0820 am with the pcp- reminder given today Advised patient to discuss changes in blood pressures or heart health with provider; Provided education on prescribed diet heart healthy/ADA diet;  Discussed complications of poorly controlled blood pressure such as heart disease, stroke, circulatory complications, vision complications, kidney impairment, sexual dysfunction;  Screening for signs and symptoms of depression related to chronic disease state;  Assessed social determinant of health barriers;   Symptom Management: Take medications as prescribed   Attend all scheduled provider appointments Perform all self care activities independently  Call provider office for new concerns or questions  call the Suicide and Crisis Lifeline: 988 call the [place] National Suicide Prevention Lifeline: 774 725 5658 or TTY: 831-599-5232 TTY 9105188217) to talk to a trained counselor call 1-800-273-TALK (toll free, 24 hour hotline) if experiencing a Mental Health or [place]  check blood pressure weekly learn about high blood pressure keep a blood pressure log take blood pressure log to all doctor appointments call doctor for signs and symptoms of high blood pressure develop an action plan for high blood pressure keep all doctor appointments take medications for blood pressure exactly as prescribed report new symptoms to your doctor  Follow  Up Plan: Telephone follow up appointment with care management team member scheduled for: 04-06-2022 at 1 pm       CCM Expected Outcome:  Monitor,  Self-Manage and Reduce Symptoms of Afib       Current Barriers:  Knowledge Deficits related to how to tell if his heart rate is out of rhythm  with condition of AFIB Chronic Disease Management support and education needs related to effective management of AFIB  Planned Interventions: Provider order and care plan reviewed. Collaborated with PharmD regarding patient care and plan. Takes Eliquis '5mg'$  BID. Works with pharm D for ongoing support and education Counseled on increased risk of stroke due to Afib and benefits of anticoagulation for stroke prevention           Reviewed importance of adherence to anticoagulant exactly as prescribed. The patient is compliant with medications. Advised patient to discuss changes in heart rate, changes in activity level, acute onset of shortness of breath, or other questions and concerns related to AFIB with provider Counseled on bleeding risk associated with AFIB and importance of self-monitoring for signs/symptoms of bleeding Counseled on avoidance of NSAIDs due to increased bleeding risk with anticoagulants Counseled on importance of regular laboratory monitoring as prescribed. The patient has lab work as requested by providers.  Counseled on seeking medical attention after a head injury or if there is blood in the urine/stool Afib action plan reviewed. The patient had an ablation 05-17-2020. Denies any acute changes in his AFIB or heart health. Saw cardiologist and does not have to see cardiologist again for a year unless needed before then. The patient states he is doing well and staying in because of the cold weather. Has upcoming follow up with the pcp, reminder given  today.  Screening for signs and symptoms of depression related to chronic disease state Assessed social determinant of health barriers  Symptom Management: Take medications as prescribed   Attend all scheduled provider appointments Call provider office for new concerns or questions  call  the Suicide and Crisis Lifeline: 988 call the [place] National Suicide Prevention Lifeline: 251-453-3705 or TTY: 618-673-1526 TTY 808-415-8831) to talk to a trained counselor call 1-800-273-TALK (toll free, 24 hour hotline) if experiencing a Mental Health or [place]  - make a plan to eat healthy - keep all lab appointments - take medicine as prescribed  Follow Up Plan: Telephone follow up appointment with care management team member scheduled for: 04-06-2022 at 1 pm       CCM Expected Outcome:  Monitor, Self-Manage and Reduce Symptoms of Diabetes       Current Barriers:  Knowledge Deficits related to monitoring for changes in blood sugars and the effect of elevated blood sugars on other body systems Chronic Disease Management support and education needs related to effective management of DM Lab Results  Component Value Date   HGBA1C 6.8 (H) 11/29/2021     Planned Interventions: Provided education to patient about basic DM disease process; Reviewed medications with patient and discussed importance of medication adherence. The patient is compliant with medications. Did not tolerate Ozempic. Is currently controlling DM with diet.         Reviewed prescribed diet with patient heart healthy/ADA diet. Patient is mindful of dietary restrictions. The patient denies any issues with dietary restrictions ; Counseled on importance of regular laboratory monitoring as prescribed. Has regular lab testing;        Discussed plans with patient for ongoing care management follow up and provided  patient with direct contact information for care management team;      Provided patient with written educational materials related to hypo and hyperglycemia and importance of correct treatment;       Reviewed scheduled/upcoming provider appointments including: 03-01-2022 at 50 am. Reminder given today         Advised patient, providing education and rationale, to check cbg when you have symptoms of low  or high blood sugar and as directed   and record        Referral made to pharmacy team for assistance with medication reconciliation, education and support. Works with [place] D on a consistent basis;       Review of patient status, including review of consultants reports, relevant laboratory and other test results, and medications completed;       Advised patient to discuss changes in DM health and well being with provider;      Screening for signs and symptoms of depression related to chronic disease state;        Assessed social determinant of health barriers;       Eye exam completed today (02-09-2022). Denies any concerns with vision and states he received a good report.    Symptom Management: Take medications as prescribed   Attend all scheduled provider appointments Call provider office for new concerns or questions  call the Suicide and Crisis Lifeline: 988 call the [place] National Suicide Prevention Lifeline: 984-561-5560 or TTY: 989-136-1862 TTY 323-288-0298) to talk to a trained counselor call 1-800-273-TALK (toll free, 24 hour hotline) if experiencing a Mental Health or [place]  check feet daily for cuts, sores or redness trim toenails straight across manage portion size wash and dry feet carefully every day wear comfortable, cotton socks wear comfortable, well-fitting shoes  Follow Up Plan: Telephone follow up appointment with care management team member scheduled for: 04-06-2022 at 1 pm          Plan:Telephone follow up appointment with care management team member scheduled for:  04-06-2022 at 1 pm  [place], MSN, CCM RN Care Manager  Chronic Care Management Direct Number: [phone]

## 2022-02-22 DIAGNOSIS — E1159 Type 2 diabetes mellitus with other circulatory complications: Secondary | ICD-10-CM | POA: Diagnosis not present

## 2022-02-22 DIAGNOSIS — I10 Essential (primary) hypertension: Secondary | ICD-10-CM

## 2022-02-22 DIAGNOSIS — I4891 Unspecified atrial fibrillation: Secondary | ICD-10-CM | POA: Diagnosis not present

## 2022-02-26 NOTE — Patient Instructions (Signed)
Diabetes Mellitus Basics  Diabetes mellitus, or diabetes, is a long-term (chronic) disease. It occurs when the body does not properly use sugar (glucose) that is released from food after you eat. Diabetes mellitus may be caused by one or both of these problems: Your pancreas does not make enough of a hormone called insulin. Your body does not react in a normal way to the insulin that it makes. Insulin lets glucose enter cells in your body. This gives you energy. If you have diabetes, glucose cannot get into cells. This causes high blood glucose (hyperglycemia). How to treat and manage diabetes You may need to take insulin or other diabetes medicines daily to keep your glucose in balance. If you are prescribed insulin, you will learn how to give yourself insulin by injection. You may need to adjust the amount of insulin you take based on the foods that you eat. You will need to check your blood glucose levels using a glucose monitor as told by your health care provider. The readings can help determine if you have low or high blood glucose. Generally, you should have these blood glucose levels: Before meals (preprandial): 80-130 mg/dL (4.4-7.2 mmol/L). After meals (postprandial): below 180 mg/dL (10 mmol/L). Hemoglobin A1c (HbA1c) level: less than 7%. Your health care provider will set treatment goals for you. Keep all follow-up visits. This is important. Follow these instructions at home: Diabetes medicines Take your diabetes medicines every day as told by your health care provider. List your diabetes medicines here: Name of medicine: ______________________________ Amount (dose): _______________ Time (a.m./p.m.): _______________ Notes: ___________________________________ Name of medicine: ______________________________ Amount (dose): _______________ Time (a.m./p.m.): _______________ Notes: ___________________________________ Name of medicine: ______________________________ Amount (dose):  _______________ Time (a.m./p.m.): _______________ Notes: ___________________________________ Insulin If you use insulin, list the types of insulin you use here: Insulin type: ______________________________ Amount (dose): _______________ Time (a.m./p.m.): _______________Notes: ___________________________________ Insulin type: ______________________________ Amount (dose): _______________ Time (a.m./p.m.): _______________ Notes: ___________________________________ Insulin type: ______________________________ Amount (dose): _______________ Time (a.m./p.m.): _______________ Notes: ___________________________________ Insulin type: ______________________________ Amount (dose): _______________ Time (a.m./p.m.): _______________ Notes: ___________________________________ Insulin type: ______________________________ Amount (dose): _______________ Time (a.m./p.m.): _______________ Notes: ___________________________________ Managing blood glucose  Check your blood glucose levels using a glucose monitor as told by your health care provider. Write down the times that you check your glucose levels here: Time: _______________ Notes: ___________________________________ Time: _______________ Notes: ___________________________________ Time: _______________ Notes: ___________________________________ Time: _______________ Notes: ___________________________________ Time: _______________ Notes: ___________________________________ Time: _______________ Notes: ___________________________________  Low blood glucose Low blood glucose (hypoglycemia) is when glucose is at or below 70 mg/dL (3.9 mmol/L). Symptoms may include: Feeling: Hungry. Sweaty and clammy. Irritable or easily upset. Dizzy. Sleepy. Having: A fast heartbeat. A headache. A change in your vision. Numbness around the mouth, lips, or tongue. Having trouble with: Moving (coordination). Sleeping. Treating low blood glucose To treat low blood  glucose, eat or drink something containing sugar right away. If you can think clearly and swallow safely, follow the 15:15 rule: Take 15 grams of a fast-acting carb (carbohydrate), as told by your health care provider. Some fast-acting carbs are: Glucose tablets: take 3-4 tablets. Hard candy: eat 3-5 pieces. Fruit juice: drink 4 oz (120 mL). Regular (not diet) soda: drink 4-6 oz (120-180 mL). Honey or sugar: eat 1 Tbsp (15 mL). Check your blood glucose levels 15 minutes after you take the carb. If your glucose is still at or below 70 mg/dL (3.9 mmol/L), take 15 grams of a carb again. If your glucose does not go above 70 mg/dL (3.9 mmol/L) after   3 tries, get help right away. After your glucose goes back to normal, eat a meal or a snack within 1 hour. Treating very low blood glucose If your glucose is at or below 54 mg/dL (3 mmol/L), you have very low blood glucose (severe hypoglycemia). This is an emergency. Do not wait to see if the symptoms will go away. Get medical help right away. Call your local emergency services (911 in the [place] Do not drive yourself to the hospital. Questions to ask your health care provider Should I talk with a diabetes educator? What equipment will I need to care for myself at home? What diabetes medicines do I need? When should I take them? How often do I need to check my blood glucose levels? What number can I call if I have questions? When is my follow-up visit? Where can I find a support group for people with diabetes? Where to find more information American Diabetes Association: www.diabetes.org Association of Diabetes Care and Education Specialists: www.diabeteseducator.org Contact a health care provider if: Your blood glucose is at or above 240 mg/dL (13.3 mmol/L) for 2 days in a row. You have been sick or have had a fever for 2 days or more, and you are not getting better. You have any of these problems for more than 6 hours: You cannot eat or  drink. You feel nauseous. You vomit. You have diarrhea. Get help right away if: Your blood glucose is lower than 54 mg/dL (3 mmol/L). You get confused. You have trouble thinking clearly. You have trouble breathing. These symptoms may represent a serious problem that is an emergency. Do not wait to see if the symptoms will go away. Get medical help right away. Call your local emergency services (911 in the [place] Do not drive yourself to the hospital. Summary Diabetes mellitus is a chronic disease that occurs when the body does not properly use sugar (glucose) that is released from food after you eat. Take insulin and diabetes medicines as told. Check your blood glucose every day, as often as told. Keep all follow-up visits. This is important. This information is not intended to replace advice given to you by your health care provider. Make sure you discuss any questions you have with your health care provider. Document Revised: 05/13/2019 Document Reviewed: 05/13/2019 Elsevier Patient Education  2023 [employer] Inc.  

## 2022-03-01 ENCOUNTER — Encounter: Payer: Self-pay | Admitting: Nurse Practitioner

## 2022-03-01 ENCOUNTER — Other Ambulatory Visit: Payer: Self-pay | Admitting: Internal Medicine

## 2022-03-01 ENCOUNTER — Ambulatory Visit (INDEPENDENT_AMBULATORY_CARE_PROVIDER_SITE_OTHER): Payer: Medicare Other | Admitting: Nurse Practitioner

## 2022-03-01 VITALS — BP 122/86 | HR 62 | Temp 97.9°F | Ht 70.0 in | Wt 292.9 lb

## 2022-03-01 DIAGNOSIS — I480 Paroxysmal atrial fibrillation: Secondary | ICD-10-CM

## 2022-03-01 DIAGNOSIS — I951 Orthostatic hypotension: Secondary | ICD-10-CM

## 2022-03-01 DIAGNOSIS — G2581 Restless legs syndrome: Secondary | ICD-10-CM

## 2022-03-01 DIAGNOSIS — F1190 Opioid use, unspecified, uncomplicated: Secondary | ICD-10-CM

## 2022-03-01 DIAGNOSIS — E538 Deficiency of other specified B group vitamins: Secondary | ICD-10-CM

## 2022-03-01 DIAGNOSIS — F339 Major depressive disorder, recurrent, unspecified: Secondary | ICD-10-CM

## 2022-03-01 DIAGNOSIS — E669 Obesity, unspecified: Secondary | ICD-10-CM

## 2022-03-01 DIAGNOSIS — I7143 Infrarenal abdominal aortic aneurysm, without rupture: Secondary | ICD-10-CM | POA: Diagnosis not present

## 2022-03-01 DIAGNOSIS — E785 Hyperlipidemia, unspecified: Secondary | ICD-10-CM

## 2022-03-01 DIAGNOSIS — E1169 Type 2 diabetes mellitus with other specified complication: Secondary | ICD-10-CM

## 2022-03-01 DIAGNOSIS — I4819 Other persistent atrial fibrillation: Secondary | ICD-10-CM

## 2022-03-01 DIAGNOSIS — E1159 Type 2 diabetes mellitus with other circulatory complications: Secondary | ICD-10-CM | POA: Diagnosis not present

## 2022-03-01 DIAGNOSIS — Z950 Presence of cardiac pacemaker: Secondary | ICD-10-CM

## 2022-03-01 DIAGNOSIS — Z79899 Other long term (current) drug therapy: Secondary | ICD-10-CM

## 2022-03-01 DIAGNOSIS — E559 Vitamin D deficiency, unspecified: Secondary | ICD-10-CM

## 2022-03-01 DIAGNOSIS — I152 Hypertension secondary to endocrine disorders: Secondary | ICD-10-CM

## 2022-03-01 DIAGNOSIS — I700 Atherosclerosis of aorta: Secondary | ICD-10-CM

## 2022-03-01 DIAGNOSIS — G894 Chronic pain syndrome: Secondary | ICD-10-CM

## 2022-03-01 DIAGNOSIS — N400 Enlarged prostate without lower urinary tract symptoms: Secondary | ICD-10-CM

## 2022-03-01 DIAGNOSIS — D6869 Other thrombophilia: Secondary | ICD-10-CM

## 2022-03-01 DIAGNOSIS — N1831 Chronic kidney disease, stage 3a: Secondary | ICD-10-CM

## 2022-03-01 DIAGNOSIS — F419 Anxiety disorder, unspecified: Secondary | ICD-10-CM

## 2022-03-01 DIAGNOSIS — G4733 Obstructive sleep apnea (adult) (pediatric): Secondary | ICD-10-CM

## 2022-03-01 DIAGNOSIS — I442 Atrioventricular block, complete: Secondary | ICD-10-CM

## 2022-03-01 NOTE — Assessment & Plan Note (Addendum)
Ongoing and stable.  Have recommended 100% use.

## 2022-03-01 NOTE — Assessment & Plan Note (Signed)
Chronic, ongoing.  Discussed at length risk of benzo and opioid use in conjunction with each other.  Recommend he not take the two together at same hour during day or evening. Recommend now that has CPAP and improved sleep trial cut back slowly on Ativan.   Continue to collaborate with CCM team on education.  Provided wife and him with copy of [facility] benzo risk information patient sheet in past and they are aware of risks.  They request 90 day supply, as previous PCP supplied this.  Are aware he does have to return every 3 months for refills.  UDS up to date with pain management.  Refills sent.

## 2022-03-01 NOTE — Assessment & Plan Note (Signed)
Ongoing and stable.  Followed by cardiology.  Pacemaker checks by them.

## 2022-03-01 NOTE — Assessment & Plan Note (Signed)
Regular checks with cardiology.

## 2022-03-01 NOTE — Telephone Encounter (Signed)
Prescription refill request for Eliquis received. Indication: Afib  Last office visit: 3/14/263 Caryl Comes)  Scr: 1.14 (11/29/21)  Age: 77 Weight: 132.9kg  Appropriate dose. Refill sent.

## 2022-03-01 NOTE — Assessment & Plan Note (Signed)
Chronic, ongoing. Continue current medication regimen and adjust as needed.  Lipid panel today. 

## 2022-03-01 NOTE — Assessment & Plan Note (Signed)
Diagnosed on 02/21/21, started on Ozempic, but this was stopped in August 2023 due to poor tolerance.  A1c 6.8%, last visit, will recheck today and get urine ALB.  His wife and him wish to not start new medication, but focus on diet over next 3 months.  Will consider SGLT2 in future which would offer heart and kidney protection. Glucometer supplies present, recommend he check sugars at least daily fasting.  Labs today.  Return in 3 months.

## 2022-03-01 NOTE — Assessment & Plan Note (Signed)
Chronic, ongoing.  Denies SI/HI.  Continue current medication regimen and adjust as needed.  Would benefit from trial off Effexor and trial of SSRI, but refuses this.

## 2022-03-01 NOTE — Assessment & Plan Note (Addendum)
Chronic, stable, followed by cardiology.  Rate control present.  Continue current medication regimen as prescribed by them.  Labs: CMP.  Ablation last on 05/17/20.  Would benefit repeat echo this year.

## 2022-03-01 NOTE — Assessment & Plan Note (Signed)
Noted on past imaging, continue statin and BP control for prevention.

## 2022-03-01 NOTE — Assessment & Plan Note (Signed)
Chronic, ongoing followed by pain clinic, Dr. Andree Elk.  Discussed at length risk of benzo and opioid use in conjunction with each other.  Recommend he not take the two together at same hour during day or evening.

## 2022-03-01 NOTE — Assessment & Plan Note (Signed)
Has been on Ativan for many years, with trial of cutting back unsuccessful.  At length discussions about risk of opioid and benzo use had with patient and wife by both PCP and CCM PharmD.  Continue these discussions.  Refer to anxiety plan.

## 2022-03-01 NOTE — Assessment & Plan Note (Signed)
Followed by neurology, continue current medication regimen as prescribed by them.

## 2022-03-01 NOTE — Assessment & Plan Note (Signed)
Continue collaboration with cardiology + neurology + medication regimen as prescribed by them.  Recommend ensuring good hydration throughout daytime hours + use of compression hose at home (on during daytime and off during night), agrees to try them, although does endorse not wearing.

## 2022-03-01 NOTE — Assessment & Plan Note (Signed)
BMI 42.03 with HTN, A-Fib, Heart Block, and CKD.  Recommended eating smaller high protein, low fat meals more frequently and exercising 30 mins a day 5 times a week with a goal of 10-15lb weight loss in the next 3 months. Patient voiced their understanding and motivation to adhere to these recommendations.

## 2022-03-01 NOTE — Progress Notes (Signed)
BP 122/86 (BP Location: Left Arm, Patient Position: Sitting, Cuff Size: Large)   Pulse 62   Temp 97.9 F (36.6 C) (Oral)   Ht '5\' 10"'$  (1.778 m)   Wt 292 lb 14.4 oz (132.9 kg)   SpO2 96%   BMI 42.03 kg/m    Subjective:    Patient ID: Nicholas [name], male    DOB: [birth year], 77 y.o.   MRN: [id number]  HPI: Nicholas [name] is a 77 y.o. male  Chief Complaint  Patient presents with   Diabetes   Hypertension   Hyperlipidemia   Chronic Kidney Disease   DIABETES A1c 6.8% in November.  Stopped Ozempic months back due to GI symptoms, including increased heart burn.  Currently takes Protonix daily for GERD. Hypoglycemic episodes:no Polydipsia/polyuria: no Visual disturbance: no Chest pain: no Paresthesias: no Glucose Monitoring: yes  Accucheck frequency: not checking  Fasting glucose:   Post prandial:  Evening:  Before meals: Taking Insulin?: no  Long acting insulin:  Short acting insulin: Blood Pressure Monitoring: daily Retinal Examination: Up To Date -- Dr. Waynetta Sandy daughter Foot Exam: Up to Date Pneumovax: Up to Date Influenza: Up to Date Aspirin: no   HYPERTENSION / HYPERLIPIDEMIA/A-FIB Saw cardiology 04/05/21 last, to return annually.  Had ablation on 05/17/20.  Echo last October 2021 noting EF 60-65%., no LVH, and Grade I Diastolic dysfunction.  Last pacemaker check 12/12/21 -- was placed January 2018. OSA diagnosed June 2022 -- is using CPAP 100% of the time and finds benefit with sleep pattern and this.  Continues on Rosuvastatin, Eliquis, fish oil.  Last neurology visit 09/14/21 for neuropathy and orthostatic BP -- they stopped Midodrine due to elevations in BP and continued Florinef 100 MCG daily.  Parkinson's testing was negative.  Satisfied with current treatment? yes Duration of hypertension: chronic BP monitoring frequency: not checking BP range:  BP medication side effects: no Duration of hyperlipidemia: chronic Cholesterol medication side effects:  no Cholesterol supplements: fish oil Medication compliance: good compliance Aspirin: no Recent stressors: no Recurrent headaches: no Visual changes: no Palpitations: no Dyspnea: if exerting self a lot, even with showering (although wife reports this is baseline).  Sometimes notices orthopnea -- sleeps on one pillow at baselines. Chest pain: no Lower extremity edema: occasional, takes Torsemide as needed Dizzy/lightheaded: this has improved some, not as often  CHRONIC KIDNEY DISEASE CKD status: stable Medications renally dose: yes Previous renal evaluation: no Pneumovax:  Up to Date Influenza Vaccine:  Up to Date   DEPRESSION & CHRONIC PAIN Taking Effexor, Seroquel, and Ativan.  Pt and his wife at bedside made aware of risks of benzo medication use to include increased sedation, respiratory suppression, falls, extrapyramidal movements, dependence and cardiovascular events.  Pt and his wife would like to continue treatment as benefit determined to outweigh risk.  Multiple at length discussions with him that he is also on opioid therapy.  Discussed risks with taking these three medications together at same time and recommend to separate them when taking Seroquel, Ativan, and opioid.  Is a [employer].   He has tried in past taking 1/2 tablet Ativan, but this does not work well (per wife and patient) and wishes to maintain current dosing.  Last Ativan fill on PDMP review 01/24/22 and last opioid fill 01/31/22.  Last UDS with pain management on 10/19/21. Saw Dr. Andree Elk on 02/08/22. Duration:stable Anxious mood: not as much now Excessive worrying: yes Irritability: yes Sweating: no Nausea: no Palpitations:no Hyperventilation: no Panic attacks: no Agoraphobia: no  Obscessions/compulsions: no Depressed mood: no    12/05/2021    1:06 PM 11/29/2021    8:53 AM 10/18/2021   10:39 AM 08/24/2021    8:38 AM 05/23/2021    9:05 AM  Depression screen PHQ 2/9  Decreased Interest 0 0 0 2 3  Down, Depressed,  Hopeless 0 0 0 3 3  PHQ - 2 Score 0 0 0 5 6  Altered sleeping  0  3 0  Tired, decreased energy  0  3 2  Change in appetite  0  2 2  Feeling bad or failure about yourself   0  3 2  Trouble concentrating  0  1 0  Moving slowly or fidgety/restless  0  0 0  Suicidal thoughts  0  3 3  PHQ-9 Score  0  20 15  Difficult doing work/chores  Not difficult at all          11/29/2021    8:53 AM 08/24/2021    8:38 AM 05/23/2021    9:05 AM 02/21/2021   11:10 AM  GAD 7 : Generalized Anxiety Score  Nervous, Anxious, on Edge 1 3 0 0  Control/stop worrying '2 3 3 '$ 0  Worry too much - different things '1 3 3 '$ 0  Trouble relaxing '1 3 1 '$ 0  Restless 1 3 0 0  Easily annoyed or irritable '1 3 2 '$ 0  Afraid - awful might happen '1 3 2 '$ 0  Total GAD 7 Score '8 21 11 '$ 0  Anxiety Difficulty Not difficult at all Extremely difficult Somewhat difficult Not difficult at all    Relevant past medical, surgical, family and social history reviewed and updated as indicated. Interim medical history since our last visit reviewed. Allergies and medications reviewed and updated.  Review of Systems  Constitutional:  Negative for activity change, appetite change, fatigue and fever.  Respiratory:  Negative for cough, chest tightness, shortness of breath and wheezing.   Cardiovascular:  Negative for chest pain, palpitations and leg swelling.  Endocrine: Negative for polydipsia, polyphagia and polyuria.  Musculoskeletal:  Positive for arthralgias.  Neurological: Negative.   Psychiatric/Behavioral:  Negative for decreased concentration, self-injury, sleep disturbance and suicidal ideas. The patient is not nervous/anxious.     Per HPI unless specifically indicated above     Objective:    BP 122/86 (BP Location: Left Arm, Patient Position: Sitting, Cuff Size: Large)   Pulse 62   Temp 97.9 F (36.6 C) (Oral)   Ht '5\' 10"'$  (1.778 m)   Wt 292 lb 14.4 oz (132.9 kg)   SpO2 96%   BMI 42.03 kg/m   Wt Readings from Last 3 Encounters:   03/01/22 292 lb 14.4 oz (132.9 kg)  12/05/21 283 lb (128.4 kg)  11/29/21 288 lb (130.6 kg)    Physical Exam Vitals and nursing note reviewed.  Constitutional:      General: He is awake. He is not in acute distress.    Appearance: He is well-developed and well-groomed. He is obese.  HENT:     Head: Normocephalic and atraumatic.     Right Ear: Hearing normal. No drainage.     Left Ear: Hearing normal. No drainage.  Eyes:     General: Lids are normal.        Right eye: No discharge.        Left eye: No discharge.     Conjunctiva/sclera: Conjunctivae normal.     Pupils: Pupils are equal, round, and reactive to light.  Neck:  Thyroid: No thyromegaly.     Vascular: No carotid bruit.  Cardiovascular:     Rate and Rhythm: Normal rate and regular rhythm.     Heart sounds: Normal heart sounds, S1 normal and S2 normal. No murmur heard.    No gallop.  Pulmonary:     Effort: Pulmonary effort is normal. No accessory muscle usage or respiratory distress.     Breath sounds: Normal breath sounds.  Abdominal:     General: Bowel sounds are normal. There is no distension.     Palpations: Abdomen is soft.     Tenderness: There is no abdominal tenderness.  Musculoskeletal:        General: Normal range of motion.     Cervical back: Normal range of motion and neck supple.     Right lower leg: No edema.     Left lower leg: No edema.  Skin:    General: Skin is warm and dry.  Neurological:     Mental Status: He is alert and oriented to person, place, and time.  Psychiatric:        Attention and Perception: Attention normal.        Mood and Affect: Mood normal.        Behavior: Behavior normal. Behavior is cooperative.        Thought Content: Thought content normal.    Results for orders placed or performed in visit on 12/12/21  CUP PACEART REMOTE DEVICE CHECK  Result Value Ref Range   Date Time Interrogation Session [id number]    Pulse Generator Manufacturer MERM    Pulse Gen  Model A2DR01 Advisa DR MRI    Pulse Gen Serial Number [id number]    Clinic Name [facility]    Implantable Pulse Generator Type Implantable Pulse Generator    Implantable Pulse Generator Implant Date [id number]    Implantable Lead Manufacturer MERM    Implantable Lead Model 3830 SelectSecure MRI SureScan    Implantable Lead Serial Number [id number]    Implantable Lead Implant Date [id number]    Implantable Lead Location Detail 1 UNKNOWN    Implantable Lead Special Function Bundle of His    Implantable Lead Location [id number]    Implantable Lead Connection Status [id number]    Implantable Lead Manufacturer MERM    Implantable Lead Model 5076 CapSureFix Novus MRI SureScan    Implantable Lead Serial Number [id number]    Implantable Lead Implant Date 25053976    Implantable Lead Location Detail 1 APPENDAGE    Implantable Lead Location [id number]    Implantable Lead Connection Status [id number]    Lead Channel Setting Sensing Sensitivity 0.6 mV   Lead Channel Setting Pacing Amplitude 2 V   Lead Channel Setting Pacing Pulse Width 1 ms   Lead Channel Setting Pacing Amplitude 2.25 V   Zone Setting Status [id number]    Zone Setting Status [phone]    Lead Channel Impedance Value 380 ohm   Lead Channel Impedance Value 342 ohm   Lead Channel Sensing Intrinsic Amplitude 3 mV   Lead Channel Sensing Intrinsic Amplitude 3 mV   Lead Channel Pacing Threshold Amplitude 0.5 V   Lead Channel Pacing Threshold Pulse Width 0.4 ms   Lead Channel Impedance Value 456 ohm   Lead Channel Impedance Value 323 ohm   Lead Channel Sensing Intrinsic Amplitude 1.625 mV   Lead Channel Sensing Intrinsic Amplitude 1.625 mV   Lead Channel Pacing Threshold Amplitude 0.75 V   Lead Channel Pacing Threshold Pulse Width 0.4 ms  Battery Status OK    Battery Remaining Longevity 23 mo   Battery Voltage 2.94 V   Brady Statistic RA Percent Paced 19.69 %   Brady Statistic RV Percent Paced 99.95 %   Brady Statistic AP VP Percent 19.73 %    Brady Statistic AS VP Percent 80.23 %   Brady Statistic AP VS Percent 0 %   Brady Statistic AS VS Percent 0.04 %      Assessment & Plan:   Problem List Items Addressed This Visit       Cardiovascular and Mediastinum   Abdominal aortic aneurysm (AAA) (HCC) (Chronic)    Ongoing.  Noted on imaging on 11/22/19.  With recommendation to monitor annually.  Will continue statin and monitor BP control.  Last CT abdomen in March 2023 with no aneurysm reported.  Will repeat in April 2024.      Relevant Orders   Lipid Panel w/o Chol/HDL Ratio   Comprehensive metabolic panel   Aortic atherosclerosis (HCC) (Chronic)    Noted on past imaging, continue statin and BP control for prevention.      Relevant Orders   Lipid Panel w/o Chol/HDL Ratio   Comprehensive metabolic panel   PAF (paroxysmal atrial fibrillation) (HCC) (Chronic)    Chronic, stable, followed by cardiology.  Rate control present.  Continue current medication regimen as prescribed by them.  Labs: CMP.  Ablation last on 05/17/20.  Would benefit repeat echo this year.      Relevant Orders   Comprehensive metabolic panel   Chronic orthostatic hypotension    Continue collaboration with cardiology + neurology + medication regimen as prescribed by them.  Recommend ensuring good hydration throughout daytime hours + use of compression hose at home (on during daytime and off during night), agrees to try them, although does endorse not wearing.      Complete heart block (HCC)    Ongoing and stable.  Followed by cardiology.  Pacemaker checks by them.       Relevant Orders   Comprehensive metabolic panel   Hypertension associated with diabetes (HCC)    Chronic, stable.  BP at goal today on recheck.  Followed by cardiology.  With orthostatic BP presenting occasionally.  Continue current regimen at this time and have recommended utilizing compression hose at home, agrees to try this.  Continue collaboration with cardiology + neurology and  current medication regimen.  Labs: CMP.  Recommend ensuring good fluid intake at home.        Relevant Orders   Urine Microalbumin w/creat. ratio   TSH   HgB A1c   Comprehensive metabolic panel     Respiratory   OSA (obstructive sleep apnea)    Ongoing and stable.  Have recommended 100% use.        Endocrine   Hyperlipidemia associated with type 2 diabetes mellitus (HCC)    Chronic, ongoing.  Continue current medication regimen and adjust as needed. Lipid panel today.      Relevant Orders   Lipid Panel w/o Chol/HDL Ratio   HgB A1c   Comprehensive metabolic panel   Type 2 diabetes mellitus with obesity ([place]) - Primary    Diagnosed on 02/21/21, started on Ozempic, but this was stopped in August 2023 due to poor tolerance.  A1c 6.8%, last visit, will recheck today and get urine ALB.  His wife and him wish to not start new medication, but focus on diet over next 3 months.  Will consider SGLT2 in future which would offer heart and  kidney protection. Glucometer supplies present, recommend he check sugars at least daily fasting.  Labs today.  Return in 3 months.      Relevant Orders   Urine Microalbumin w/creat. ratio   HgB A1c     Genitourinary   Stage 3a chronic kidney disease (HCC) (Chronic)    Ongoing stage 3a, recheck CMP today and adjust medications as needed.  His recent labs since July 2022 have been within good ranges and improved.  Will discontinue this diagnosis.      Relevant Orders   Urine Microalbumin w/creat. ratio   Comprehensive metabolic panel   BPH (benign prostatic hyperplasia)   Relevant Orders   PSA, total and free     Other   Anxiety (Chronic)    Chronic, ongoing.  Discussed at length risk of benzo and opioid use in conjunction with each other.  Recommend he not take the two together at same hour during day or evening. Recommend now that has CPAP and improved sleep trial cut back slowly on Ativan.   Continue to collaborate with CCM team on education.   Provided wife and him with copy of [facility] ben[facility] risk information patient sheet in past and they are aware of risks.  They request 90 day supply, as previous PCP supplied this.  Are aware he does have to return every 3 months for refills.  UDS up to date with pain management.  Refills sent.        Chronic pain syndrome (Chronic)    Chronic, ongoing followed by pain clinic, Dr. Andree Elk.  Discussed at length risk of benzo and opioid use in conjunction with each other.  Recommend he not take the two together at same hour during day or evening.        Depression, recurrent (HCC) (Chronic)    Chronic, ongoing.  Denies SI/HI.  Continue current medication regimen and adjust as needed.  Would benefit from trial off Effexor and trial of SSRI, but refuses this.        Long-term current use of benzodiazepine (Chronic)    Has been on Ativan for many years, with trial of cutting back unsuccessful.  At length discussions about risk of opioid and benzo use had with patient and wife by both PCP and CCM PharmD.  Continue these discussions.  Refer to anxiety plan.      Morbid obesity (HCC) (Chronic)    BMI 42.03 with HTN, A-Fib, Heart Block, and CKD.  Recommended eating smaller high protein, low fat meals more frequently and exercising 30 mins a day 5 times a week with a goal of 10-15lb weight loss in the next 3 months. Patient voiced their understanding and motivation to adhere to these recommendations.       Pacemaker - MDT (Chronic)    Regular checks with cardiology.      Restless leg syndrome (Chronic)    Followed by neurology, continue current medication regimen as prescribed by them.      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  Continue to monitor closely for bleeding or increased bruising.  CBC annually.      Relevant Orders   CBC with Differential/Platelet   Chronic, continuous use of opioids    Chronic, ongoing.  Continue collaboration with pain management.       Other Visit Diagnoses     B12 deficiency       History of low level reported, check today and initiate supplement as needed.   Relevant  Orders   Vitamin B12   Vitamin D deficiency       History of low level reported, check today and initiate supplement as needed.   Relevant Orders   VITAMIN D 25 Hydroxy (Vit-D Deficiency, Fractures)        Follow up plan: Return in about 3 months (around 05/30/2022) for T2DM, HTN/HLD, MOOD.

## 2022-03-01 NOTE — Assessment & Plan Note (Signed)
Chronic, ongoing.  Continue collaboration with pain management.

## 2022-03-01 NOTE — Assessment & Plan Note (Signed)
On Eliquis with A-fib.  Continue to monitor closely for bleeding or increased bruising.  CBC annually.

## 2022-03-01 NOTE — Assessment & Plan Note (Signed)
Chronic, stable.  BP at goal today on recheck.  Followed by cardiology.  With orthostatic BP presenting occasionally.  Continue current regimen at this time and have recommended utilizing compression hose at home, agrees to try this.  Continue collaboration with cardiology + neurology and current medication regimen.  Labs: CMP.  Recommend ensuring good fluid intake at home.

## 2022-03-01 NOTE — Assessment & Plan Note (Signed)
Ongoing.  Noted on imaging on 11/22/19.  With recommendation to monitor annually.  Will continue statin and monitor BP control.  Last CT abdomen in March 2023 with no aneurysm reported.  Will repeat in April 2024.

## 2022-03-01 NOTE — Assessment & Plan Note (Signed)
Ongoing stage 3a, recheck CMP today and adjust medications as needed.  His recent labs since July 2022 have been within good ranges and improved.  Will discontinue this diagnosis.

## 2022-03-02 LAB — SPECIMEN STATUS REPORT

## 2022-03-02 LAB — PSA, TOTAL AND FREE
PSA, Free Pct: 11.9 %
PSA, Free: 0.74 ng/mL
Prostate Specific Ag, Serum: 6.2 ng/mL — ABNORMAL HIGH (ref 0.0–4.0)

## 2022-03-02 NOTE — Progress Notes (Signed)
Contacted via MyChart   Good afternoon Nicholas [name], your labs have returned: - A1c is 6.8%, remaining same as previous.  Continue current focus on diet and regular activity. - Kidney function, creatinine and eGFR, remains normal, as is liver function, AST and ALT.  - PSA levels have returned -- will pass these on to urology for your visit. - Remainder of labs are stable, no medication changes needed.  If any questions let me know.   Keep being amazing!!  Thank you for allowing me to participate in your care.  I appreciate you. Kindest regards, Refugia Laneve

## 2022-03-03 ENCOUNTER — Other Ambulatory Visit: Payer: Medicare Other

## 2022-03-03 LAB — MICROALBUMIN / CREATININE URINE RATIO
Creatinine, Urine: 216.1 mg/dL
Microalb/Creat Ratio: 17 mg/g creat (ref 0–29)
Microalbumin, Urine: 37 ug/mL

## 2022-03-03 LAB — COMPREHENSIVE METABOLIC PANEL
ALT: 14 IU/L (ref 0–44)
AST: 23 IU/L (ref 0–40)
Albumin/Globulin Ratio: 1.9 (ref 1.2–2.2)
Albumin: 4.2 g/dL (ref 3.8–4.8)
Alkaline Phosphatase: 56 IU/L (ref 44–121)
BUN/Creatinine Ratio: 13 (ref 10–24)
BUN: 12 mg/dL (ref 8–27)
Bilirubin Total: 0.6 mg/dL (ref 0.0–1.2)
CO2: 25 mmol/L (ref 20–29)
Calcium: 9.3 mg/dL (ref 8.6–10.2)
Chloride: 100 mmol/L (ref 96–106)
Creatinine, Ser: 0.95 mg/dL (ref 0.76–1.27)
Globulin, Total: 2.2 g/dL (ref 1.5–4.5)
Glucose: 161 mg/dL — ABNORMAL HIGH (ref 70–99)
Potassium: 4.4 mmol/L (ref 3.5–5.2)
Sodium: 138 mmol/L (ref 134–144)
Total Protein: 6.4 g/dL (ref 6.0–8.5)
eGFR: 83 mL/min/{1.73_m2} (ref >59)

## 2022-03-03 LAB — LIPID PANEL W/O CHOL/HDL RATIO
Cholesterol, Total: 134 mg/dL (ref 100–199)
HDL: 58 mg/dL (ref >39)
LDL Chol Calc (NIH): 58 mg/dL (ref 0–99)
Triglycerides: 97 mg/dL (ref 0–149)
VLDL Cholesterol Cal: 18 mg/dL (ref 5–40)

## 2022-03-03 LAB — CBC WITH DIFFERENTIAL/PLATELET
Basophils Absolute: 0 10*3/uL (ref 0.0–0.2)
Basos: 1 %
EOS (ABSOLUTE): 0.2 10*3/uL (ref 0.0–0.4)
Eos: 5 %
Hematocrit: 46.2 % (ref 37.5–51.0)
Hemoglobin: 14.7 g/dL (ref 13.0–17.7)
Immature Grans (Abs): 0 10*3/uL (ref 0.0–0.1)
Immature Granulocytes: 0 %
Lymphocytes Absolute: 1.1 10*3/uL (ref 0.7–3.1)
Lymphs: 23 %
MCH: 29.6 pg (ref 26.6–33.0)
MCHC: 31.8 g/dL (ref 31.5–35.7)
MCV: 93 fL (ref 79–97)
Monocytes Absolute: 0.5 10*3/uL (ref 0.1–0.9)
Monocytes: 9 %
Neutrophils Absolute: 3.1 10*3/uL (ref 1.4–7.0)
Neutrophils: 62 %
Platelets: 159 10*3/uL (ref 150–450)
RBC: 4.97 x10E6/uL (ref 4.14–5.80)
RDW: 12.6 % (ref 11.6–15.4)
WBC: 5.1 10*3/uL (ref 3.4–10.8)

## 2022-03-03 LAB — TSH: TSH: 1.23 u[IU]/mL (ref 0.450–4.500)

## 2022-03-03 LAB — HEMOGLOBIN A1C
Est. average glucose Bld gHb Est-mCnc: 148 mg/dL
Hgb A1c MFr Bld: 6.8 % — ABNORMAL HIGH (ref 4.8–5.6)

## 2022-03-03 LAB — SPECIMEN STATUS REPORT

## 2022-03-03 LAB — VITAMIN B12: Vitamin B-12: 783 pg/mL (ref 232–1245)

## 2022-03-03 LAB — VITAMIN D 25 HYDROXY (VIT D DEFICIENCY, FRACTURES): Vit D, 25-Hydroxy: 30 ng/mL (ref 30.0–100.0)

## 2022-03-07 ENCOUNTER — Ambulatory Visit: Payer: Medicare Other | Admitting: Urology

## 2022-03-07 VITALS — BP 161/77 | HR 76 | Ht 71.0 in | Wt 288.4 lb

## 2022-03-07 DIAGNOSIS — R972 Elevated prostate specific antigen [PSA]: Secondary | ICD-10-CM

## 2022-03-07 DIAGNOSIS — N3281 Overactive bladder: Secondary | ICD-10-CM | POA: Diagnosis not present

## 2022-03-07 DIAGNOSIS — R350 Frequency of micturition: Secondary | ICD-10-CM

## 2022-03-07 DIAGNOSIS — N400 Enlarged prostate without lower urinary tract symptoms: Secondary | ICD-10-CM

## 2022-03-07 NOTE — Progress Notes (Signed)
I,Nicholas [name],acting as a scribe for Nicholas Espy, MD.,have documented all relevant documentation on the behalf of Nicholas Espy, MD,as directed by  Nicholas Espy, MD while in the presence of Nicholas Espy, MD.   03/07/22 12:53 PM   [name] [birth year] [id number]  Referring provider: Venita Lick, NP [id number] [place],  [place] [address]  Chief Complaint  Patient presents with   Benign Prostatic Hypertrophy   Elevated PSA    HPI: 77 year-old male ho returns today for a six month follow up with PSA.   At last visit he was referred back for a bump in his PSA up to 7.1 on 08/24/2021; previously in the 4-5 range.   His most recent PSA on 03/01/2022 was 6.2.   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  He reports that his hip pain has gone away which he is happy about.   He has minimal leakage after urination. He is taking VESIcare as prescribed but doesn't particularly recall the extent of his urgency and frequency issues.    PMH: Past Medical History:  Diagnosis Date   Abdominal pain, chronic, right lower quadrant 12/13/2020   Anterior urethral stricture    Anxiety    Arthritis    a. knees, hips, hands;  b. 11/2013 s/p L TKA @ [place].   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 Dr Caryl Comes   Depression    DVT (deep venous thrombosis) ([place])    Erosive esophagitis    Gross hematuria    Hyperlipemia    Hypertension    borderline   Internal hemorrhoids    Phimosis    Pulmonary embolism ([place])     Surgical History: Past Surgical History:  Procedure Laterality Date   ATRIAL FIBRILLATION ABLATION N/A 05/17/2020   Procedure: ATRIAL FIBRILLATION ABLATION;  Surgeon: Vickie Epley, MD;  Location: [facility];  Service: Cardiovascular;  Laterality: N/A;    BUNIONECTOMY Bilateral 01/06/2015   Procedure: Lillard Anes;  Surgeon: [name], MD;  Location: [facility];  Service: Orthopedics;  Laterality: Bilateral;   CARDIAC CATHETERIZATION  ~ 2005   "once"   CATARACT EXTRACTION W/ INTRAOCULAR LENS  IMPLANT, BILATERAL Bilateral ~ 2010   COLONOSCOPY WITH PROPOFOL N/A 12/07/2015   Procedure: COLONOSCOPY WITH PROPOFOL;  Surgeon: Lollie Sails, MD;  Location: [facility] END[facility]COPY;  Service: Endoscopy;  Laterality: N/A;   COLONOSCOPY WITH PROPOFOL N/A 11/26/2020   Procedure: COLONOSCOPY WITH PROPOFOL;  Surgeon: Lin Landsman, MD;  Location: [facility] ENDOSCOPY;  Service: Gastroenterology;  Laterality: N/A;   EP IMPLANTABLE DEVICE N/A 02/16/2016   MDT dual chamber (His Bundle) pacemaker implanted by Dr Caryl Comes for intermittent complete heart block   ESOPHAGOGASTRODUODENOSCOPY (EGD) WITH PROPOFOL N/A 12/07/2015   Procedure: ESOPHAGOGASTRODUODENOSCOPY (EGD) WITH PROPOFOL;  Surgeon: Lollie Sails, MD;  Location: [facility] ENDOSCOPY;  Service: Endoscopy;  Laterality: N/A;   ESOPHAGOGASTRODUODENOSCOPY (EGD) WITH PROPOFOL N/A 05/17/2017   Procedure: ESOPHAGOGASTRODUODENOSCOPY (EGD) WITH PROPOFOL;  Surgeon: Lollie Sails, MD;  Location: [facility] ENDOSCOPY;  Service: Endoscopy;  Laterality: N/A;   HAMMER TOE SURGERY Bilateral 01/06/2015   Procedure: HAMMER TOE CORRECTION;  Surgeon: [name], MD;  Location: [facility]S;  Service: Orthopedics;  Laterality: Bilateral;   KNEE CARTILAGE SURGERY Left 1965   "  football injury"   Left Total Knee Arthroplasty     a. 11/2013 [facility].   PACEMAKER IMPLANT  2017   PILONIDAL CYST EXCISION  1970's   TOTAL HIP ARTHROPLASTY Right 2004   TOTAL HIP ARTHROPLASTY Left 2006   UPPER GI ENDOSCOPY      Home Medications:  Allergies as of 03/07/2022   No Known Allergies      Medication List        Accurate as of March 07, 2022 12:53 PM. If you have any questions, ask your nurse or doctor.          acetaminophen 650 MG CR  tablet Commonly known as: TYLENOL Take 650 mg by mouth every 8 (eight) hours as needed for pain.   Cranberry 500 MG Caps Take 500 mg by mouth daily.   Eliquis 5 MG Tabs tablet Generic drug: apixaban TAKE 1 TABLET BY MOUTH TWICE A DAY   Fish Oil 1200 MG Caps Take 1,200 mg by mouth daily.   fludrocortisone 0.1 MG tablet Commonly known as: FLORINEF Take 100 mcg by mouth daily.   gabapentin 600 MG tablet Commonly known as: NEURONTIN TAKE 1 TABLET BY MOUTH EVERYDAY AT BEDTIME   glucose blood test strip Use to check blood sugar 3 times a day and document results, bring to appointments.  Goal is <130 fasting blood sugar and <180 two hours after meals.   HYDROcodone-acetaminophen 5-325 MG tablet Commonly known as: NORCO/VICODIN Take 1 tablet by mouth 2 (two) times daily as needed for moderate pain or severe pain.   LORazepam 1 MG tablet Commonly known as: ATIVAN TAKE 1 TABLET BY MOUTH EVERYDAY AT BEDTIME   Magnesium 400 MG Tabs Take 400 mg by mouth daily.   MELATONIN PO Take by mouth.   midodrine 10 MG tablet Commonly known as: PROAMATINE Take 0.5 tablets (5 mg total) by mouth 3 (three) times daily. What changed:  when to take this reasons to take this   multivitamin with minerals Tabs tablet Take 1 tablet by mouth daily. Centrum Silver   onetouch ultrasoft lancets Use to check blood sugar 3 times a day and document results, bring to appointments.  Goal is <130 fasting blood sugar and <180 two hours after meals.   OneTouch Verio w/Device Kit Use to check blood sugar 3 times a day and document results, bring to appointments.  Goal is <130 fasting blood sugar and <180 two hours after meals.   oxymetazoline 0.05 % nasal spray Commonly known as: AFRIN Place 2 sprays into both nostrils at bedtime.   pantoprazole 20 MG tablet Commonly known as: PROTONIX Take 1 tablet (20 mg total) by mouth 2 (two) times daily before a meal.   potassium chloride SA 20 MEQ  tablet Commonly known as: KLOR-CON M Take 2 tablets (40 meq) x 1 dose as directed What changed:  how much to take how to take this when to take this reasons to take this additional instructions   QUEtiapine Fumarate 150 MG 24 hr tablet Commonly known as: SEROQUEL XR TAKE 1 TABLET BY MOUTH AT BEDTIME.   rOPINIRole 0.25 MG tablet Commonly known as: REQUIP Take 0.25 mg by mouth 4 (four) times daily.   rosuvastatin 40 MG tablet Commonly known as: CRESTOR TAKE 1 TABLET BY MOUTH EVERY DAY   solifenacin 10 MG tablet Commonly known as: VESICARE TAKE 1 TABLET BY MOUTH EVERY DAY   tiZANidine 4 MG tablet Commonly known as: Zanaflex Take 1 tablet (4 mg total) by mouth every  6 (six) hours as needed for muscle spasms.   torsemide 20 MG tablet Commonly known as: DEMADEX Take 40 mg by mouth daily as needed (fluid retention).   venlafaxine XR 75 MG 24 hr capsule Commonly known as: EFFEXOR-XR TAKE 3 CAPSULES BY MOUTH DAILY.   vitamin C 1000 MG tablet Take 1,000 mg by mouth daily.        Family History: Family History  Problem Relation Age of Onset   Alcoholism Mother        died in her 54's.   Cancer Mother    Stroke Father        deceased   Diabetes Father    Hypertension Father    Glaucoma Sister    Breast cancer Sister    Diabetes Daughter    Asthma Son    Stroke Paternal Grandmother    Prostate cancer Paternal Grandfather     Social History:  reports that he has never smoked. He has never used smokeless tobacco. He reports that he does not currently use alcohol. He reports that he does not use drugs.   Physical Exam: BP (!) 161/77   Pulse 76   Ht 5' 11"$  (1.803 m)   Wt 288 lb 6 oz (130.8 kg)   BMI 40.22 kg/m   Constitutional:  Alert and oriented, No acute distress. HEENT: [place] AT, moist mucus membranes.  Trachea midline, no masses. GU: Normal sphincter tone, moderately enlarged prostate. No nodules; benign.  Neurologic: Grossly intact, no focal deficits,  moving all 4 extremities. Psychiatric: Normal mood and affect.  Assessment & Plan:    Elevated PSA   - Trending back downwards. We'll continue conservative management, especially in light of his age.  Burnis Medin plan for PSA only in six months here, and PSA with M.D. in a year as well as a rectal exam.  -His rectal exam today was benign.  2. OAB  - He's been on VESIcare for a prolonged period of time. Questioning whether he really needs this medication, so he is going to take a holiday from it and will let us know how it goes and if he needs to start it again.   Return in about 6 months (around 09/05/2022) for PSA., f/u MD 12 mo with PSA/ DRE/ IPSS/ PVR  I have reviewed the above documentation for accuracy and completeness, and I agree with the above.   Nicholas Espy, MD   [facility] [address], [place], [place] [address] [phone]

## 2022-03-13 ENCOUNTER — Ambulatory Visit (INDEPENDENT_AMBULATORY_CARE_PROVIDER_SITE_OTHER): Payer: Medicare Other

## 2022-03-13 DIAGNOSIS — I442 Atrioventricular block, complete: Secondary | ICD-10-CM

## 2022-03-13 LAB — CUP PACEART REMOTE DEVICE CHECK
Battery Remaining Longevity: 22 mo
Battery Voltage: 2.93 V
Brady Statistic AP VP Percent: 38.23 %
Brady Statistic AP VS Percent: 0 %
Brady Statistic AS VP Percent: 61.74 %
Brady Statistic AS VS Percent: 0.02 %
Brady Statistic RA Percent Paced: 38.13 %
Brady Statistic RV Percent Paced: 99.97 %
Date Time Interrogation Session: 20240219091427
Implantable Lead Connection Status: 753985
Implantable Lead Connection Status: 753985
Implantable Lead Implant Date: 20180124
Implantable Lead Implant Date: 20180124
Implantable Lead Location: 753859
Implantable Lead Location: 753860
Implantable Lead Model: 3830
Implantable Lead Model: 5076
Implantable Pulse Generator Implant Date: 20180124
Lead Channel Impedance Value: 323 Ohm
Lead Channel Impedance Value: 342 Ohm
Lead Channel Impedance Value: 361 Ohm
Lead Channel Impedance Value: 437 Ohm
Lead Channel Pacing Threshold Amplitude: 0.5 V
Lead Channel Pacing Threshold Amplitude: 0.75 V
Lead Channel Pacing Threshold Pulse Width: 0.4 ms
Lead Channel Pacing Threshold Pulse Width: 0.4 ms
Lead Channel Sensing Intrinsic Amplitude: 0.75 mV
Lead Channel Sensing Intrinsic Amplitude: 0.75 mV
Lead Channel Sensing Intrinsic Amplitude: 2.875 mV
Lead Channel Sensing Intrinsic Amplitude: 2.875 mV
Lead Channel Setting Pacing Amplitude: 2 V
Lead Channel Setting Pacing Amplitude: 2.25 V
Lead Channel Setting Pacing Pulse Width: 1 ms
Lead Channel Setting Sensing Sensitivity: 0.6 mV
Zone Setting Status: 755011
Zone Setting Status: 755011

## 2022-04-02 ENCOUNTER — Other Ambulatory Visit: Payer: Self-pay | Admitting: Nurse Practitioner

## 2022-04-02 DIAGNOSIS — I10 Essential (primary) hypertension: Secondary | ICD-10-CM

## 2022-04-03 ENCOUNTER — Emergency Department: Payer: Medicare Other

## 2022-04-03 ENCOUNTER — Other Ambulatory Visit: Payer: Self-pay

## 2022-04-03 ENCOUNTER — Ambulatory Visit: Payer: Self-pay

## 2022-04-03 ENCOUNTER — Ambulatory Visit (INDEPENDENT_AMBULATORY_CARE_PROVIDER_SITE_OTHER): Payer: Medicare Other | Admitting: Family Medicine

## 2022-04-03 ENCOUNTER — Emergency Department
Admission: EM | Admit: 2022-04-03 | Discharge: 2022-04-03 | Disposition: A | Discharge status: Home / Self Care | Payer: Medicare Other | Attending: Emergency Medicine | Admitting: Emergency Medicine

## 2022-04-03 ENCOUNTER — Encounter: Payer: Self-pay | Admitting: Family Medicine

## 2022-04-03 VITALS — BP 143/87 | HR 91 | Temp 98.2°F | Ht 71.0 in | Wt 292.7 lb

## 2022-04-03 DIAGNOSIS — Z950 Presence of cardiac pacemaker: Secondary | ICD-10-CM | POA: Diagnosis not present

## 2022-04-03 DIAGNOSIS — M7989 Other specified soft tissue disorders: Secondary | ICD-10-CM | POA: Diagnosis not present

## 2022-04-03 DIAGNOSIS — I129 Hypertensive chronic kidney disease with stage 1 through stage 4 chronic kidney disease, or unspecified chronic kidney disease: Secondary | ICD-10-CM | POA: Insufficient documentation

## 2022-04-03 DIAGNOSIS — N189 Chronic kidney disease, unspecified: Secondary | ICD-10-CM | POA: Insufficient documentation

## 2022-04-03 DIAGNOSIS — Z86711 Personal history of pulmonary embolism: Secondary | ICD-10-CM | POA: Diagnosis not present

## 2022-04-03 DIAGNOSIS — R0609 Other forms of dyspnea: Secondary | ICD-10-CM | POA: Insufficient documentation

## 2022-04-03 DIAGNOSIS — E119 Type 2 diabetes mellitus without complications: Secondary | ICD-10-CM | POA: Diagnosis not present

## 2022-04-03 DIAGNOSIS — Z7901 Long term (current) use of anticoagulants: Secondary | ICD-10-CM | POA: Insufficient documentation

## 2022-04-03 DIAGNOSIS — I4891 Unspecified atrial fibrillation: Secondary | ICD-10-CM | POA: Diagnosis not present

## 2022-04-03 DIAGNOSIS — R0602 Shortness of breath: Secondary | ICD-10-CM | POA: Insufficient documentation

## 2022-04-03 LAB — TROPONIN I (HIGH SENSITIVITY)
Troponin I (High Sensitivity): 27 ng/L — ABNORMAL HIGH (ref <18)
Troponin I (High Sensitivity): 23 ng/L — ABNORMAL HIGH (ref <18)

## 2022-04-03 LAB — CBC
HCT: 46.8 % (ref 39.0–52.0)
Hemoglobin: 15.5 g/dL (ref 13.0–17.0)
MCH: 30.3 pg (ref 26.0–34.0)
MCHC: 33.1 g/dL (ref 30.0–36.0)
MCV: 91.4 fL (ref 80.0–100.0)
Platelets: 167 10*3/uL (ref 150–400)
RBC: 5.12 MIL/uL (ref 4.22–5.81)
RDW: 12.7 % (ref 11.5–15.5)
WBC: 6.3 10*3/uL (ref 4.0–10.5)
nRBC: 0 % (ref 0.0–0.2)

## 2022-04-03 LAB — COMPREHENSIVE METABOLIC PANEL
ALT: 21 U/L (ref 0–44)
AST: 32 U/L (ref 15–41)
Albumin: 4.6 g/dL (ref 3.5–5.0)
Alkaline Phosphatase: 50 U/L (ref 38–126)
Anion gap: 11 (ref 5–15)
BUN: 22 mg/dL (ref 8–23)
CO2: 23 mmol/L (ref 22–32)
Calcium: 9.5 mg/dL (ref 8.9–10.3)
Chloride: 104 mmol/L (ref 98–111)
Creatinine, Ser: 0.91 mg/dL (ref 0.61–1.24)
GFR, Estimated: >60 mL/min (ref >60)
Glucose, Bld: 166 mg/dL — ABNORMAL HIGH (ref 70–99)
Potassium: 4.6 mmol/L (ref 3.5–5.1)
Sodium: 138 mmol/L (ref 135–145)
Total Bilirubin: 0.9 mg/dL (ref 0.3–1.2)
Total Protein: 7.7 g/dL (ref 6.5–8.1)

## 2022-04-03 LAB — BRAIN NATRIURETIC PEPTIDE: B Natriuretic Peptide: 34.4 pg/mL (ref 0.0–100.0)

## 2022-04-03 LAB — APTT: aPTT: 30 seconds (ref 24–36)

## 2022-04-03 MED ORDER — IOHEXOL 350 MG/ML SOLN
75.0000 mL | Freq: Once | INTRAVENOUS | Status: AC | PRN
  Administered 2022-04-03: 75 mL via INTRAVENOUS

## 2022-04-03 NOTE — Telephone Encounter (Signed)
Requested Prescriptions  Pending Prescriptions Disp Refills   solifenacin (VESICARE) 10 MG tablet [Pharmacy Med Name: SOLIFENACIN 10 MG TABLET] 90 tablet 3    Sig: TAKE 1 TABLET BY MOUTH EVERY DAY     Urology:  Bladder Agents 2 Passed - 04/02/2022  8:36 AM      Passed - Cr in normal range and within 360 days    Creatinine  Date Value Ref Range Status  02/26/2014 0.94 0.60 - 1.30 mg/dL Final   Creatinine, Ser  Date Value Ref Range Status  04/03/2022 0.91 0.61 - 1.24 mg/dL Final         Passed - ALT in normal range and within 360 days    ALT  Date Value Ref Range Status  04/03/2022 21 0 - 44 U/L Final   SGPT (ALT)  Date Value Ref Range Status  04/07/2013 22 12 - 78 U/L Final   ALT (SGPT) Piccolo, Waived  Date Value Ref Range Status  01/03/2018 24 10 - 47 U/L Final         Passed - AST in normal range and within 360 days    AST  Date Value Ref Range Status  04/03/2022 32 15 - 41 U/L Final   SGOT(AST)  Date Value Ref Range Status  04/07/2013 28 15 - 37 Unit/L Final   AST (SGOT) Piccolo, Waived  Date Value Ref Range Status  01/03/2018 34 11 - 38 U/L Final         Passed - Valid encounter within last 12 months    Recent Outpatient Visits           Today SOB (shortness of breath)   [facility], Megan P, DO   1 month ago Type 2 diabetes mellitus with obesity ([place])   [place], [place] T, NP   4 months ago Type 2 diabetes mellitus with obesity ([place])   [place], [place] T, NP   7 months ago Type 2 diabetes mellitus with obesity ([place])   [place], [name], NP   10 months ago Acute cough   [place], Barbaraann Faster, NP       Future Appointments             In 1 month [place], Barbaraann Faster, NP [place], [place]   In 11 months Hollice Espy, MD [facility] [place]

## 2022-04-03 NOTE — Telephone Encounter (Signed)
    Chief Complaint: "I have had SOB, but it's getting worse." Symptoms: Above Frequency: 2 days Pertinent Negatives: Patient denies chest pain Disposition: [] ED /[] Urgent Care (no appt availability in office) / [x] Appointment(In office/virtual)/ []  [facility]/ [] Home Care/ [] Refused Recommended Disposition /[] [facility]/ []  Follow-up with PCP Additional Notes:    Reason for Disposition  [1] Longstanding difficulty breathing (e.g., CHF, COPD, emphysema) AND [2] WORSE than normal  Answer Assessment - Initial Assessment Questions 1. RESPIRATORY STATUS: "Describe your breathing?" (e.g., wheezing, shortness of breath, unable to speak, severe coughing)      SOB 2. ONSET: "When did this breathing problem begin?"      2 nights ago 3. PATTERN "Does the difficult breathing come and go, or has it been constant since it started?"      constant 4. SEVERITY: "How bad is your breathing?" (e.g., mild, moderate, severe)    - MILD: No SOB at rest, mild SOB with walking, speaks normally in sentences, can lie down, no retractions, pulse < 100.    - MODERATE: SOB at rest, SOB with minimal exertion and prefers to sit, cannot lie down flat, speaks in phrases, mild retractions, audible wheezing, pulse 100-120.    - SEVERE: Very SOB at rest, speaks in single words, struggling to breathe, sitting hunched forward, retractions, pulse > 120      Moderate 5. RECURRENT SYMPTOM: "Have you had difficulty breathing before?" If Yes, ask: "When was the last time?" and "What happened that time?"      Yes 6. CARDIAC HISTORY: "Do you have any history of heart disease?" (e.g., heart attack, angina, bypass surgery, angioplasty)      No 7. LUNG HISTORY: "Do you have any history of lung disease?"  (e.g., pulmonary embolus, asthma, emphysema)     OSA 8. CAUSE: "What do you think is causing the breathing problem?"      Unsure 9. OTHER SYMPTOMS: "Do you have any other symptoms? (e.g., dizziness, runny  nose, cough, chest pain, fever)     No 10. O2 SATURATION MONITOR:  "Do you use an oxygen saturation monitor (pulse oximeter) at home?" If Yes, ask: "What is your reading (oxygen level) today?" "What is your usual oxygen saturation reading?" (e.g., 95%)       No 11. PREGNANCY: "Is there any chance you are pregnant?" "When was your last menstrual period?"       N/a 12. TRAVEL: "Have you traveled out of the country in the last month?" (e.g., travel history, exposures)       No  Protocols used: Breathing Difficulty-A-AH

## 2022-04-03 NOTE — ED Triage Notes (Signed)
Pt here with SOB for a few days. Pt sent here from primary  office. Pt denies CP. Pt has hx of pacemaker and blood clots in lungs. Pt is on a blood thinner.

## 2022-04-03 NOTE — Progress Notes (Signed)
Sinus rhythm at 87bpm. Slight elevation of ST segment in V3. Patient to go to ER.

## 2022-04-03 NOTE — ED Provider Notes (Signed)
[facility] Provider Note    Event Date/Time   First MD Initiated Contact with Patient 04/03/22 1107     (approximate)   History   Chief Complaint Shortness of Breath   HPI  Nicholas [name] is a 77 y.o. male with past medical history of hypertension, diabetes, atrial fibrillation on Eliquis, complete heart block status post pacemaker, PE, AAA, CKD, and chronic pain syndrome who presents to the ED complaining shortness of breath.  Patient reports that he has been dealing with increasing difficulty breathing for about the past week, has noticed some swelling in his legs during this time.  Symptoms are present primarily with exertion, but he has begun to notice difficulty breathing more even at rest.  He states that symptoms became worse last night, when he had difficulty laying down even while using his CPAP mask.  He denies any fevers, cough, or chest pain, has not noticed any pain in either leg.  He reports a history of pulmonary embolism, but has been compliant with his Eliquis and has not missed any doses.     Physical Exam   Triage Vital Signs: ED Triage Vitals  Enc Vitals Group     BP 04/03/22 1053 (!) 168/94     Pulse Rate 04/03/22 1053 92     Resp 04/03/22 1053 20     Temp 04/03/22 1053 98 F (36.7 C)     Temp Source 04/03/22 1053 Oral     SpO2 04/03/22 1053 94 %     Weight 04/03/22 1054 292 lb 12.3 oz (132.8 kg)     Height 04/03/22 1054 '5\' 11"'$  (1.803 m)     Head Circumference --      Peak Flow --      Pain Score 04/03/22 1053 0     Pain Loc --      Pain Edu? --      Excl. in [place]? --     Most recent vital signs: Vitals:   04/03/22 1400 04/03/22 1430  BP: (!) 167/98 (!) 148/89  Pulse: 88 82  Resp:    Temp:    SpO2: 95% 95%    Constitutional: Alert and oriented. Eyes: Conjunctivae are normal. Head: Atraumatic. Nose: No congestion/rhinnorhea. Mouth/Throat: Mucous membranes are moist.  Cardiovascular: Normal rate, regular rhythm.  Grossly normal heart sounds.  2+ radial pulses bilaterally. Respiratory: Normal respiratory effort.  No retractions. Lungs with crackles to bilateral bases. Gastrointestinal: Soft and nontender. No distention. Musculoskeletal: No lower extremity tenderness, 1+ pitting edema to knees bilaterally. Neurologic:  Normal speech and language. No gross focal neurologic deficits are appreciated.    ED Results / Procedures / Treatments   Labs (all labs ordered are listed, but only abnormal results are displayed) Labs Reviewed  COMPREHENSIVE METABOLIC PANEL - Abnormal; Notable for the following components:      Result Value   Glucose, Bld 166 (*)    All other components within normal limits  TROPONIN I (HIGH SENSITIVITY) - Abnormal; Notable for the following components:   Troponin I (High Sensitivity) 27 (*)    All other components within normal limits  TROPONIN I (HIGH SENSITIVITY) - Abnormal; Notable for the following components:   Troponin I (High Sensitivity) 23 (*)    All other components within normal limits  CBC  APTT  BRAIN NATRIURETIC PEPTIDE     EKG  ED ECG REPORT I, Blake Divine, the attending physician, personally viewed and interpreted this ECG.   Date: 04/03/2022  EKG Time: 10:50  Rate: 92  Rhythm: Atrial sensed ventricular paced rhythm  Axis: LAD  Intervals:none  ST&T Change: None  RADIOLOGY Chest x-ray reviewed and interpreted by me with no infiltrate, edema, or effusion.  PROCEDURES:  Critical Care performed: No  Procedures   MEDICATIONS ORDERED IN ED: Medications  iohexol (OMNIPAQUE) 350 MG/ML injection 75 mL (75 mLs Intravenous Contrast Given 04/03/22 1342)     IMPRESSION / MDM / ASSESSMENT AND PLAN / ED COURSE  I reviewed the triage vital signs and the nursing notes.                              77 y.o. male with past medical history of hypertension, diabetes, atrial fibrillation on Eliquis, PE, complete heart block status post pacemaker, AAA,  CKD, and chronic pain syndrome who presents to the ED with increasing difficulty breathing over the past couple of days, especially with exertion and when lying flat.  Patient's presentation is most consistent with acute presentation with potential threat to life or bodily function.  Differential diagnosis includes, but is not limited to, ACS, PE, pneumonia, pneumothorax, CHF, COPD, anemia, AKI, electrolyte abnormality.  Patient nontoxic-appearing and in no acute distress, vital signs are reassuring and patient is maintaining oxygen saturations at 93 to 94% on room air.  He does have crackles to bilateral bases and edema to his lower extremities concerning for CHF.  We will further assess with chest x-ray, EKG shows no evidence of arrhythmia or ischemia, additional labs are pending.  Labs are reassuring with no significant anemia, leukocytosis, electrolyte abnormality, or AKI.  BNP within normal limits, troponin mildly elevated compared to prior baseline at 27 but patient continues to deny any chest pain.  Given no alternative explanation for his shortness of breath, CTA was performed and is negative for PE or other acute process.  Repeat troponin is stable and case discussed with Dr. Fletcher Anon of cardiology, who agrees that patient is appropriate for discharge home with outpatient cardiology follow-up.  He has an appointment scheduled with his cardiologist tomorrow, was counseled to return to the ED for new or worsening symptoms, patient agrees with plan.      FINAL CLINICAL IMPRESSION(S) / ED DIAGNOSES   Final diagnoses:  Dyspnea on exertion     Rx / DC Orders   ED Discharge Orders     None        Note:  This document was prepared using Dragon voice recognition software and may include unintentional dictation errors.   Blake Divine, MD 04/03/22 [phone]

## 2022-04-03 NOTE — Progress Notes (Signed)
BP (!) 143/87 (BP Location: Left Arm, Cuff Size: Normal)   Pulse 91   Temp 98.2 F (36.8 C) (Oral)   Ht '5\' 11"'$  (1.803 m)   Wt 292 lb 11.2 oz (132.8 kg)   SpO2 96%   BMI 40.82 kg/m    Subjective:    Patient ID: Nicholas [name], male    DOB: Jan 08, 1946, 77 y.o.   MRN: [id number]  HPI: Nicholas [name] is a 77 y.o. male  Chief Complaint  Patient presents with   Shortness of Breath    Patient says always has issues with his breathing. Patient says the last couple of days have just gotten worse and it is only with movement. Patient is scheduled to see his Cardiology tomorrow. Patient is not sure if his batteries are low in his pacemaker or if he has blood clots again.    SHORTNESS OF BREATH Duration: Has been going on for a "while", but has been significantly worse past couple of days, worse with moving around of the weekend, but last night started to have it with rest ans has been feeling significantly worse since about 1AM Onset: sudden Description of breathing discomfort: "couldn't get his breath" Severity: severe Episode duration: with any activity Frequency: with any activity Related to exertion: yes Cough: no Chest tightness: no Wheezing: no Fevers: no Chest pain: no Palpitations: no  Nausea: no Diaphoresis: yes Deconditioning: yes Status: worse Aggravating factors: movement Alleviating factors: deep breaths and rest   Relevant past medical, surgical, family and social history reviewed and updated as indicated. Interim medical history since our last visit reviewed. Allergies and medications reviewed and updated.  Review of Systems  Constitutional:  Positive for diaphoresis and fatigue. Negative for activity change, appetite change, chills, fever and unexpected weight change.  Respiratory:  Positive for shortness of breath. Negative for apnea, cough, choking, chest tightness, wheezing and stridor.   Cardiovascular: Negative.   Gastrointestinal: Negative.    Musculoskeletal: Negative.   Psychiatric/Behavioral: Negative.      Per HPI unless specifically indicated above     Objective:    BP (!) 143/87 (BP Location: Left Arm, Cuff Size: Normal)   Pulse 91   Temp 98.2 F (36.8 C) (Oral)   Ht '5\' 11"'$  (1.803 m)   Wt 292 lb 11.2 oz (132.8 kg)   SpO2 96%   BMI 40.82 kg/m   Wt Readings from Last 3 Encounters:  04/03/22 292 lb 11.2 oz (132.8 kg)  03/07/22 288 lb 6 oz (130.8 kg)  03/01/22 292 lb 14.4 oz (132.9 kg)    Physical Exam Vitals and nursing note reviewed.  Constitutional:      General: He is not in acute distress.    Appearance: Normal appearance. He is diaphoretic. He is not ill-appearing or toxic-appearing.  HENT:     Head: Normocephalic and atraumatic.     Right Ear: External ear normal.     Left Ear: External ear normal.     Nose: Nose normal.     Mouth/Throat:     Mouth: Mucous membranes are moist.     Pharynx: Oropharynx is clear.  Eyes:     General: No scleral icterus.       Right eye: No discharge.        Left eye: No discharge.     Extraocular Movements: Extraocular movements intact.     Conjunctiva/sclera: Conjunctivae normal.     Pupils: Pupils are equal, round, and reactive to light.  Cardiovascular:  Rate and Rhythm: Normal rate and regular rhythm.     Pulses: Normal pulses.     Heart sounds: Normal heart sounds. No murmur heard.    No friction rub. No gallop.  Pulmonary:     Effort: Pulmonary effort is normal. No respiratory distress.     Breath sounds: No stridor. Examination of the right-lower field reveals decreased breath sounds. Decreased breath sounds present. No wheezing, rhonchi or rales.  Chest:     Chest wall: No tenderness.  Musculoskeletal:        General: Normal range of motion.     Cervical back: Normal range of motion and neck supple.  Skin:    General: Skin is warm.     Capillary Refill: Capillary refill takes less than 2 seconds.     Coloration: Skin is not jaundiced or pale.      Findings: No bruising, erythema, lesion or rash.  Neurological:     General: No focal deficit present.     Mental Status: He is alert and oriented to person, place, and time. Mental status is at baseline.  Psychiatric:        Mood and Affect: Mood is anxious.        Behavior: Behavior normal.        Thought Content: Thought content normal.        Judgment: Judgment normal.     Results for orders placed or performed in visit on 03/13/22  CUP PACEART REMOTE DEVICE CHECK  Result Value Ref Range   Date Time Interrogation Session [id number]    Pulse Generator Manufacturer MERM    Pulse Gen Model [id number] Advisa DR MRI    Pulse Gen Serial Number [id number]    Clinic [facility]    Implantable Pulse Generator Type Implantable Pulse Generator    Implantable Pulse Generator Implant Date [id number]    Implantable Lead Manufacturer MERM    Implantable Lead Model 3830 SelectSecure MRI SureScan    Implantable Lead Serial Number [id number]    Implantable Lead Implant Date TE:[id number]    Implantable Lead Location Detail 1 UNKNOWN    Implantable Lead Special Function Bundle of His    Implantable Lead Location [id number]    Implantable Lead Connection Status [id number]    Implantable Lead Manufacturer MERM    Implantable Lead Model 5076 CapSureFix Novus MRI SureScan    Implantable Lead Serial Number [id number]    Implantable Lead Implant Date TE:[id number]    Implantable Lead Location Detail 1 APPENDAGE    Implantable Lead Location [id number]    Implantable Lead Connection Status [id number]    Lead Channel Setting Sensing Sensitivity 0.6 mV   Lead Channel Setting Pacing Amplitude 2 V   Lead Channel Setting Pacing Pulse Width 1 ms   Lead Channel Setting Pacing Amplitude 2.25 V   Zone Setting Status [id number]    Zone Setting Status [phone]    Lead Channel Impedance Value 361 ohm   Lead Channel Impedance Value 342 ohm   Lead Channel Sensing Intrinsic Amplitude 2.875 mV   Lead Channel Sensing Intrinsic  Amplitude 2.875 mV   Lead Channel Pacing Threshold Amplitude 0.5 V   Lead Channel Pacing Threshold Pulse Width 0.4 ms   Lead Channel Impedance Value 437 ohm   Lead Channel Impedance Value 323 ohm   Lead Channel Sensing Intrinsic Amplitude 0.75 mV   Lead Channel Sensing Intrinsic Amplitude 0.75 mV   Lead Channel Pacing Threshold Amplitude 0.75 V   Lead Channel Pacing Threshold Pulse  Width 0.4 ms   Battery Status OK    Battery Remaining Longevity 22 mo   Battery Voltage 2.93 V   Brady Statistic RA Percent Paced 38.13 %   Brady Statistic RV Percent Paced 99.97 %   Brady Statistic AP VP Percent 38.23 %   Brady Statistic AS VP Percent 61.74 %   Brady Statistic AP VS Percent 0 %   Brady Statistic AS VS Percent 0.02 %      Assessment & Plan:   Problem List Items Addressed This Visit   None Visit Diagnoses     SOB (shortness of breath)    -  Primary   Slightly elevating ST segment of V3 on EKG. Diaphoretic and SOB. Will send to ER. Called over to ER. Call with any concerns. Follow up after ER visit.   Relevant Orders   EKG 12-Lead (Completed)   EKG 12-Lead (Completed)        Follow up plan: Return After ER visit.

## 2022-04-03 NOTE — Progress Notes (Signed)
Poor baseline- will repeat

## 2022-04-04 ENCOUNTER — Ambulatory Visit: Payer: Medicare Other | Attending: Internal Medicine | Admitting: Internal Medicine

## 2022-04-04 ENCOUNTER — Encounter: Payer: Self-pay | Admitting: Internal Medicine

## 2022-04-04 VITALS — BP 114/70 | HR 97 | Ht 70.0 in | Wt 278.4 lb

## 2022-04-04 DIAGNOSIS — I4819 Other persistent atrial fibrillation: Secondary | ICD-10-CM

## 2022-04-04 DIAGNOSIS — I5033 Acute on chronic diastolic (congestive) heart failure: Secondary | ICD-10-CM

## 2022-04-04 DIAGNOSIS — Z79899 Other long term (current) drug therapy: Secondary | ICD-10-CM

## 2022-04-04 DIAGNOSIS — I951 Orthostatic hypotension: Secondary | ICD-10-CM

## 2022-04-04 DIAGNOSIS — Z950 Presence of cardiac pacemaker: Secondary | ICD-10-CM | POA: Diagnosis not present

## 2022-04-04 DIAGNOSIS — I442 Atrioventricular block, complete: Secondary | ICD-10-CM

## 2022-04-04 LAB — PACEMAKER DEVICE OBSERVATION

## 2022-04-04 MED ORDER — POTASSIUM CHLORIDE CRYS ER 20 MEQ PO TBCR: EXTENDED_RELEASE_TABLET | ORAL | 3 refills | Status: DC

## 2022-04-04 MED ORDER — MIDODRINE HCL 10 MG PO TABS: 5.0000 mg | ORAL_TABLET | Freq: Three times a day (TID) | ORAL | 3 refills | Status: DC

## 2022-04-04 MED ORDER — TORSEMIDE 20 MG PO TABS: 40.0000 mg | ORAL_TABLET | Freq: Every day | ORAL | 3 refills | Status: DC | PRN

## 2022-04-04 NOTE — Progress Notes (Signed)
Patient Care Team: Venita Lick, NP as PCP - General (Nurse Practitioner) Vickie Epley, MD as PCP - Electrophysiology (Cardiology) Deboraha Sprang, MD as PCP - Cardiology (Cardiology) [name], MD (Specialist) Hollice Espy, MD as Consulting Physician (Urology) Tanda Rockers, MD as Consulting Physician (Pulmonary Disease) [name], MD as Consulting Physician (Anesthesiology) Deboraha Sprang, MD as Consulting Physician (Cardiology) Greg Cutter, LCSW as Social Worker (Licensed Clinical Social Worker) Lane Hacker, [facility] (Pharmacist) Vanita Ingles, RN as Case Manager ([place])   HPI  Nicholas [name] is a 77 y.o. male Seen in follow-up for His bundle pacemaker implanted 1/18 for symptomatic bradycardia  and profound first degree AV block with intermittent third and second-degree heart block; also atrial fibrillation, previously on flecainide but discontinued because of coronary disease diagnosed by CTA 10/21.>> propafenone with subsequent increase in burden Saw CL and underwent RFCA-PVI 4/22  No significant interval atrial fibrillationz.  No bleeding on the Eliquis.  History of significant orthostatic hypotension which has been managed in the past by fludrocortisone and midodrine the latter recently discontinued by neurology currently without significant lightheadedness  Was in the emergency room yesterday 04/03/2022 with profound dyspnea, PND for about a month, worsening bendopnea and dyspnea on exertion.  Evaluation was notable for negative CTA normal BNP and was discharged without specific plans went home took torsemide with notably quite hypertensive in the emergency room (BNP was 34)  Denies chest pain.  Only trace edema.  Some abdominal swelling.    DATE TEST EF   10/15 echo     50 %   Mild RV dysfunction   11/17 echo    60 %    Nl RV function   11/17 Myoview   46 ([facility])   10/21 Cardiac-CTA  LADd- FFR 0.75; o/w >90  10/21 Echo  60-65%   10/21 CTA-PE    3/24 CTA-PE  neg    hxstory of pulmonary embolism with repeat venous Dopplers 1/16 demonstrated small clots Managed with chronic apixaban        Date Cr K LDL Hgb  6/19 1.08   14.6  6/20 1.13   14.7  6/21 1.17<<1.46   15.6  1/22 1.34 4.2 85 13.6 (10/21)  8/22 0.9 4.6 65 15.5  3./23 0.98 4.1 67 15.1  3/24 0.91 4.6  [id number]     Thromboembolic risk factors ( age -32 , HTN-1) for a CHADSVASc Score of 3      Past Medical History:  Diagnosis Date   Abdominal pain, chronic, right lower quadrant 12/13/2020   Anterior urethral stricture    Anxiety    Arthritis    a. knees, hips, hands;  b. 11/2013 s/p L TKA @ [place].   Bile reflux gastritis    Bulging lumbar disc    BXO (balanitis xerotica obliterans)    Complete heart block ([place])    a. s/p MDT dual chamber (His bundle) pacemaker 01/2016 Dr Caryl Comes   Depression    DVT (deep venous thrombosis) ([place])    Erosive esophagitis    Gross hematuria    Hyperlipemia    Hypertension    borderline   Internal hemorrhoids    Phimosis    Pulmonary embolism ([place])     Past Surgical History:  Procedure Laterality Date   ATRIAL FIBRILLATION ABLATION N/A 05/17/2020   Procedure: ATRIAL FIBRILLATION ABLATION;  Surgeon: Vickie Epley, MD;  Location: [place] [facility];  Service: Cardiovascular;  Laterality: N/A;   BUNIONECTOMY Bilateral 01/06/2015   Procedure: Lillard Anes;  Surgeon: [name], MD;  Location: [facility] ORS;  Service: Orthopedics;  Laterality: Bilateral;   CARDIAC CATHETERIZATION  ~ 2005   "once"   CATARACT EXTRACTION W/ INTRAOCULAR LENS  IMPLANT, BILATERAL Bilateral ~ 2010   COLONOSCOPY WITH PROPOFOL N/A 12/07/2015   Procedure: COLONOSCOPY WITH PROPOFOL;  Surgeon: Lollie Sails, MD;  Location: [facility] ENDOSCOPY;  Service: Endoscopy;  Laterality: N/A;   COLONOSCOPY WITH PROPOFOL N/A 11/26/2020   Procedure: COLONOSCOPY WITH PROPOFOL;  Surgeon: Lin Landsman, MD;  Location: [facility] ENDOSCOPY;  Service:  Gastroenterology;  Laterality: N/A;   EP IMPLANTABLE DEVICE N/A 02/16/2016   MDT dual chamber (His Bundle) pacemaker implanted by Dr Caryl Comes for intermittent complete heart block   ESOPHAGOGASTRODUODENOSCOPY (EGD) WITH PROPOFOL N/A 12/07/2015   Procedure: ESOPHAGOGASTRODUODENOSCOPY (EGD) WITH PROPOFOL;  Surgeon: Lollie Sails, MD;  Location: [facility] ENDOSCOPY;  Service: Endoscopy;  Laterality: N/A;   ESOPHAGOGASTRODUODENOSCOPY (EGD) WITH PROPOFOL N/A 05/17/2017   Procedure: ESOPHAGOGASTRODUODENOSCOPY (EGD) WITH PROPOFOL;  Surgeon: Lollie Sails, MD;  Location: [facility] ENDOSCOPY;  Service: Endoscopy;  Laterality: N/A;   HAMMER TOE SURGERY Bilateral 01/06/2015   Procedure: HAMMER TOE CORRECTION;  Surgeon: [name], MD;  Location: [facility] ORS;  Service: Orthopedics;  Laterality: Bilateral;   KNEE CARTILAGE SURGERY Left 1965   "football injury"   Left Total Knee Arthroplasty     a. 11/2013 [facility].   PACEMAKER IMPLANT  2017   PILONIDAL CYST EXCISION  1970's   TOTAL HIP ARTHROPLASTY Right 2004   TOTAL HIP ARTHROPLASTY Left 2006   UPPER GI ENDOSCOPY      Current Outpatient Medications  Medication Sig Dispense Refill   acetaminophen (TYLENOL) 650 MG CR tablet Take 650 mg by mouth every 8 (eight) hours as needed for pain.     Ascorbic Acid (VITAMIN C) 1000 MG tablet Take 1,000 mg by mouth daily.     Blood Glucose Monitoring Suppl (ONETOUCH VERIO) w/Device KIT Use to check blood sugar 3 times a day and document results, bring to appointments.  Goal is <130 fasting blood sugar and <180 two hours after meals. 1 kit 0   calcium-vitamin D (OSCAL WITH D) 500-5 MG-MCG tablet Take 1 tablet by mouth daily with breakfast.     Cranberry 500 MG CAPS Take 500 mg by mouth daily.     ELIQUIS 5 MG TABS tablet TAKE 1 TABLET BY MOUTH TWICE A DAY 180 tablet 1   fludrocortisone (FLORINEF) 0.1 MG tablet Take 100 mcg by mouth daily.     gabapentin (NEURONTIN) 600 MG tablet TAKE 1 TABLET BY MOUTH EVERYDAY AT BEDTIME  90 tablet 4   glucose blood test strip Use to check blood sugar 3 times a day and document results, bring to appointments.  Goal is <130 fasting blood sugar and <180 two hours after meals. 100 each 12   Lancets (ONETOUCH ULTRASOFT) lancets Use to check blood sugar 3 times a day and document results, bring to appointments.  Goal is <130 fasting blood sugar and <180 two hours after meals. 100 each 12   LORazepam (ATIVAN) 1 MG tablet TAKE 1 TABLET BY MOUTH EVERYDAY AT BEDTIME 90 tablet 0   Magnesium 400 MG TABS Take 400 mg by mouth daily.      MELATONIN PO Take by mouth.     midodrine (PROAMATINE) 10 MG tablet Take 0.5 tablets (5 mg total) by mouth 3 (three) times daily. (Patient taking differently: Take 5 mg by  mouth 3 (three) times daily as needed.) 180 tablet 2   Multiple Vitamin (MULTIVITAMIN WITH MINERALS) TABS tablet Take 1 tablet by mouth daily. Centrum Silver     Omega-3 Fatty Acids (FISH OIL) 1200 MG CAPS Take 1,200 mg by mouth daily.     oxymetazoline (AFRIN) 0.05 % nasal spray Place 2 sprays into both nostrils at bedtime.     pantoprazole (PROTONIX) 20 MG tablet Take 1 tablet (20 mg total) by mouth 2 (two) times daily before a meal. 180 tablet 4   potassium chloride SA (KLOR-CON) 20 MEQ tablet Take 2 tablets (40 meq) x 1 dose as directed (Patient taking differently: Take 40 mEq by mouth daily as needed (with torsemide (fluid retention)).) 10 tablet 0   QUEtiapine Fumarate (SEROQUEL XR) 150 MG 24 hr tablet TAKE 1 TABLET BY MOUTH AT BEDTIME. 90 tablet 4   rOPINIRole (REQUIP) 0.25 MG tablet Take 0.25 mg by mouth 4 (four) times daily.     rosuvastatin (CRESTOR) 40 MG tablet TAKE 1 TABLET BY MOUTH EVERY DAY 90 tablet 0   solifenacin (VESICARE) 10 MG tablet TAKE 1 TABLET BY MOUTH EVERY DAY 90 tablet 3   tiZANidine (ZANAFLEX) 4 MG tablet Take 1 tablet (4 mg total) by mouth every 6 (six) hours as needed for muscle spasms. 90 tablet 4   torsemide (DEMADEX) 20 MG tablet Take 40 mg by mouth daily as  needed (fluid retention).     venlafaxine XR (EFFEXOR-XR) 75 MG 24 hr capsule TAKE 3 CAPSULES BY MOUTH DAILY. 270 capsule 1   No current facility-administered medications for this visit.    No Known Allergies    Review of Systems negative except from HPI and PMH  Physical Exam  BP 114/70 (BP Location: Left Arm, Patient Position: Sitting, Cuff Size: Large)   Pulse 97   Ht '5\' 10"'$  (1.778 m)   Wt 278 lb 6.4 oz (126.3 kg)   SpO2 96%   BMI 39.95 kg/m  Well developed and well nourished in no acute distress HENT normal Neck supple with JVP 10 +HJR Bibasilar crackles Device pocket well healed; without hematoma or erythema.  There is no tethering  Regular rate and rhythm, no  gallop No  murmur Abd-soft with active BS No Clubbing cyanosis trace edema Skin-warm and dry A & Oriented  Grossly normal sensory and motor function  ECG sinus rhythm with P synchronous pacing QRSd of 102 ms  Device function is normal. 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  BNP was negative, I suspect false negative because of his obesity.  Much improved after he took a dose of torsemide after getting home with which he diuresed briskly   and was able to sleep a little bit last night.  Chest x-ray was abnormal consistent with edema.  We will continue diuresis with daily torsemide x 5 days and then every other day x 1 week.  The risks of orthostasis were reviewed and in this  regard  also we will discontinue his fludrocortisone and utilize ProAmatine previously stopped by his neurologist and resume at 5 mg 3 times a day.  This can be uptitrated back to 10 mg 3 times daily as needed  Undertake echocardiogram.  Blood pressure is reasonable at this point.  Suspect was elevated yesterday in the context of sympathetic overdrive

## 2022-04-04 NOTE — Patient Instructions (Addendum)
Medication Instructions:  Torsemide '20mg'$  (2 tablets-'40mg'$ ) by mouth daily x 5 days then 2 tablets ('40mg'$ ) by mouth every other day x 1 week then 2 tablets('40mg'$ ) daily as needed.  ** Stop Fludrocortisone  **  Restart Midodrine '10mg'$  - 1/2 tablet by mouth three times daily.  *If you need a refill on your cardiac medications before your next appointment, please call your pharmacy*   Lab Work: BMET in 2 weeks  If you have labs (blood work) drawn today and your tests are completely normal, you will receive your results only by: [place] (if you have MyChart) OR A paper copy in the mail If you have any lab test that is abnormal or we need to change your treatment, we will call you to review the results.   Testing/Procedures: Your physician has requested that you have an echocardiogram. Echocardiography is a painless test that uses sound waves to create images of your heart. It provides your doctor with information about the size and shape of your heart and how well your heart's chambers and valves are working. This procedure takes approximately one hour. There are no restrictions for this procedure. Please do NOT wear cologne, perfume, aftershave, or lotions (deodorant is allowed). Please arrive 15 minutes prior to your appointment time.    Follow-Up: At [facility], you and your health needs are our priority.  As part of our continuing mission to provide you with exceptional heart care, we have created designated Provider Care Teams.  These Care Teams include your primary Cardiologist (physician) and Advanced Practice Providers (APPs -  Physician Assistants and Nurse Practitioners) who all work together to provide you with the care you need, when you need it.  We recommend signing up for the patient portal called "MyChart".  Sign up information is provided on this After Visit Summary.  MyChart is used to connect with patients for Virtual Visits (Telemedicine).  Patients are able  to view lab/test results, encounter notes, upcoming appointments, etc.  Non-urgent messages can be sent to your provider as well.   To learn more about what you can do with MyChart, go to [email or web address].    Your next appointment:   As scheduled

## 2022-04-05 ENCOUNTER — Telehealth: Payer: Self-pay | Admitting: Nurse Practitioner

## 2022-04-05 NOTE — Telephone Encounter (Signed)
Copied from [place] [id number]. Topic: Medicare AWV >> Apr 05, 2022 11:12 AM Devoria Glassing wrote: Reason for CRM: Called patient to schedule Medicare Annual Wellness Visit (AWV). Left message for patient to call back and schedule Medicare Annual Wellness Visit (AWV).  Last date of AWV: 05/02/21  Please schedule an appointment at any time with Kirke Shaggy, LPN .  If any questions, please contact me.  Thank you ,  Sherol Dade; [place] Direct Dial: [phone]

## 2022-04-06 ENCOUNTER — Telehealth: Payer: Self-pay

## 2022-04-06 ENCOUNTER — Telehealth: Payer: Self-pay | Admitting: Internal Medicine

## 2022-04-06 ENCOUNTER — Ambulatory Visit: Payer: Medicare Other | Attending: Internal Medicine

## 2022-04-06 ENCOUNTER — Telehealth: Payer: Medicare Other

## 2022-04-06 DIAGNOSIS — Z950 Presence of cardiac pacemaker: Secondary | ICD-10-CM

## 2022-04-06 DIAGNOSIS — I442 Atrioventricular block, complete: Secondary | ICD-10-CM | POA: Diagnosis not present

## 2022-04-06 DIAGNOSIS — I951 Orthostatic hypotension: Secondary | ICD-10-CM | POA: Diagnosis not present

## 2022-04-06 DIAGNOSIS — I5033 Acute on chronic diastolic (congestive) heart failure: Secondary | ICD-10-CM

## 2022-04-06 DIAGNOSIS — I4819 Other persistent atrial fibrillation: Secondary | ICD-10-CM | POA: Diagnosis not present

## 2022-04-06 NOTE — Telephone Encounter (Signed)
Spoke with pt's wife, DPR and advised pt will just have labs in 2 weeks and will see Dr Caryl Comes again in 6 months.  Pt will receive a letter closer to the time to schedule next appointment with Dr Caryl Comes.  Pt's wife verbalizes understanding and thanked Therapist, sports for the call.

## 2022-04-06 NOTE — Telephone Encounter (Signed)
Patient's wife states that pt needs two week follow up.  Per AVS, "as scheduled". Pt is due to see Dr. Radford Pax @ the [facility] office in March. Please advise if patient needs to schedule with Caryl Comes.

## 2022-04-06 NOTE — Telephone Encounter (Signed)
   CCM RN Visit Note   04-06-2022 Name: Nicholas [name] MRN: [id number]      DOB: 1945/05/02  Subjective: Nicholas [name] is a 77 y.o. year old male who is a primary care patient of [name], NP The patient was referred to the Chronic Care Management team for assistance with care management needs subsequent to provider initiation of CCM services and plan of care.      An unsuccessful telephone outreach was attempted today to contact the patient about Chronic Care Management needs.    Plan:A HIPAA compliant phone message was left for the patient providing contact information and requesting a return call.  Noreene Larsson RN, MSN, CCM RN Care Manager  Chronic Care Management Direct Number: [phone]

## 2022-04-07 ENCOUNTER — Encounter: Payer: Self-pay | Admitting: Internal Medicine

## 2022-04-07 LAB — ECHOCARDIOGRAM COMPLETE
AR max vel: 1.97 cm2
AV Area VTI: 2.31 cm2
AV Area mean vel: 2.33 cm2
AV Mean grad: 6 mmHg
AV Peak grad: 9.4 mmHg
Ao pk vel: 1.53 m/s
Area-P 1/2: 7.29 cm2
Calc EF: 49.9 %
S' Lateral: 3.6 cm
Single Plane A2C EF: 49.2 %
Single Plane A4C EF: 50.9 %

## 2022-04-11 ENCOUNTER — Other Ambulatory Visit: Payer: Self-pay | Admitting: Internal Medicine

## 2022-04-13 ENCOUNTER — Telehealth: Payer: Self-pay | Admitting: Internal Medicine

## 2022-04-13 ENCOUNTER — Encounter: Payer: Self-pay | Admitting: Internal Medicine

## 2022-04-13 NOTE — Telephone Encounter (Signed)
Nicholas Sprang, MD 04/08/2022 10:32 AM EDT     Please Inform Patient Echo showed  normal heart muscle function Ask him to discuss with his PCP about whether he might be a candidate for SGLT2 to help with his DM and perhaps also his SOB       Thanks

## 2022-04-13 NOTE — Telephone Encounter (Signed)
Attempted to call the patient. No answer- I left a detailed message of results on the patient's identified home voice mail (ok per DPR).   I advised I will forward a copy of this report to his PCP as well to review for consideration of SGLT2.   I asked that he call back with any further questions/ concerns.

## 2022-04-14 ENCOUNTER — Other Ambulatory Visit: Payer: Self-pay | Admitting: Nurse Practitioner

## 2022-04-14 DIAGNOSIS — F339 Major depressive disorder, recurrent, unspecified: Secondary | ICD-10-CM

## 2022-04-14 NOTE — Telephone Encounter (Signed)
Requested Prescriptions  Pending Prescriptions Disp Refills   venlafaxine XR (EFFEXOR-XR) 75 MG 24 hr capsule [Pharmacy Med Name: VENLAFAXINE HCL ER 75 MG CAP] 270 capsule 0    Sig: TAKE 3 CAPSULES BY MOUTH EVERY DAY     Psychiatry: Antidepressants - SNRI - desvenlafaxine & venlafaxine Failed - 04/14/2022  2:11 AM      Failed - Lipid Panel in normal range within the last 12 months    Cholesterol, Total  Date Value Ref Range Status  03/01/2022 134 100 - 199 mg/dL Final   Cholesterol Piccolo, Waived  Date Value Ref Range Status  01/03/2018 198 <200 mg/dL Final    Comment:                            Desirable                <200                         Borderline High      200- 239                         High                     >239    LDL Chol Calc (NIH)  Date Value Ref Range Status  03/01/2022 58 0 - 99 mg/dL Final   HDL  Date Value Ref Range Status  03/01/2022 58 >39 mg/dL Final   Triglycerides  Date Value Ref Range Status  03/01/2022 97 0 - 149 mg/dL Final   Triglycerides Piccolo,Waived  Date Value Ref Range Status  01/03/2018 264 (H) <150 mg/dL Final    Comment:                            Normal                   <150                         Borderline High     150 - 199                         High                200 - 499                         Very High                >499          Passed - Cr in normal range and within 360 days    Creatinine  Date Value Ref Range Status  02/26/2014 0.94 0.60 - 1.30 mg/dL Final   Creatinine, Ser  Date Value Ref Range Status  04/03/2022 0.91 0.61 - 1.24 mg/dL Final         Passed - Completed PHQ-2 or PHQ-9 in the last 360 days      Passed - Last BP in normal range    BP Readings from Last 1 Encounters:  04/04/22 114/70         Passed - Valid encounter within last 6 months    Recent Outpatient Visits  1 week ago SOB (shortness of breath)   [facility] [place], Megan P, DO   1  month ago Type 2 diabetes mellitus with obesity ([place])   [place], Johnson T, NP   4 months ago Type 2 diabetes mellitus with obesity ([place])   [place], Stratford T, NP   7 months ago Type 2 diabetes mellitus with obesity ([place])   [place], [name], NP   10 months ago Acute cough   [place], Barbaraann Faster, NP       Future Appointments             In 1 month [place], Barbaraann Faster, NP [place], [place]   In 11 months Hollice Espy, MD [place]

## 2022-04-25 ENCOUNTER — Other Ambulatory Visit: Payer: Self-pay | Admitting: Nurse Practitioner

## 2022-04-25 DIAGNOSIS — F419 Anxiety disorder, unspecified: Secondary | ICD-10-CM

## 2022-04-25 NOTE — Telephone Encounter (Signed)
Requested medications are due for refill today.  Provider to decide  Requested medications are on the active medications list.  yes  Last refill. 10/24/2021 #90 0 rf  Future visit scheduled.   yes  Notes to clinic.  Refill not delegated.    Requested Prescriptions  Pending Prescriptions Disp Refills   LORazepam (ATIVAN) 1 MG tablet [Pharmacy Med Name: LORAZEPAM 1 MG TABLET] 90 tablet     Sig: TAKE 1 TABLET BY MOUTH EVERYDAY AT BEDTIME     Not Delegated - Psychiatry: Anxiolytics/Hypnotics 2 Failed - 04/25/2022  5:50 PM      Failed - This refill cannot be delegated      Failed - Urine Drug Screen completed in last 360 days      Passed - Patient is not pregnant      Passed - Valid encounter within last 6 months    Recent Outpatient Visits           3 weeks ago SOB (shortness of breath)   [place], Megan P, DO   1 month ago Type 2 diabetes mellitus with obesity ([place])   [place], Mebane T, NP   4 months ago Type 2 diabetes mellitus with obesity ([place])   [place], Wayne T, NP   8 months ago Type 2 diabetes mellitus with obesity ([place])   [place], [name], NP   11 months ago Acute cough   [place], Barbaraann Faster, NP       Future Appointments             In 1 month [place], Barbaraann Faster, NP [place], [place]   In 10 months Hollice Espy, [place]

## 2022-04-26 ENCOUNTER — Encounter: Payer: Self-pay | Admitting: Nurse Practitioner

## 2022-04-26 ENCOUNTER — Ambulatory Visit: Payer: Self-pay

## 2022-04-26 ENCOUNTER — Ambulatory Visit
Admission: RE | Admit: 2022-04-26 | Discharge: 2022-04-26 | Disposition: A | Discharge status: Home / Self Care | Payer: Medicare Other | Source: Home / Self Care | Attending: Nurse Practitioner | Admitting: Nurse Practitioner

## 2022-04-26 ENCOUNTER — Ambulatory Visit
Admission: RE | Admit: 2022-04-26 | Discharge: 2022-04-26 | Disposition: A | Discharge status: Home / Self Care | Payer: Medicare Other | Source: Ambulatory Visit | Attending: Nurse Practitioner | Admitting: Nurse Practitioner

## 2022-04-26 ENCOUNTER — Ambulatory Visit (INDEPENDENT_AMBULATORY_CARE_PROVIDER_SITE_OTHER): Payer: Medicare Other | Admitting: Nurse Practitioner

## 2022-04-26 VITALS — BP 129/82 | HR 88 | Temp 98.3°F | Ht 70.0 in | Wt 292.8 lb

## 2022-04-26 DIAGNOSIS — R0602 Shortness of breath: Secondary | ICD-10-CM | POA: Insufficient documentation

## 2022-04-26 MED ORDER — POTASSIUM CHLORIDE CRYS ER 20 MEQ PO TBCR: EXTENDED_RELEASE_TABLET | ORAL | 3 refills | Status: AC

## 2022-04-26 MED ORDER — TORSEMIDE 20 MG PO TABS: 40.0000 mg | ORAL_TABLET | Freq: Every day | ORAL | 3 refills | Status: DC | PRN

## 2022-04-26 MED ORDER — ALBUTEROL SULFATE HFA 108 (90 BASE) MCG/ACT IN AERS: 2.0000 | INHALATION_SPRAY | Freq: Four times a day (QID) | RESPIRATORY_TRACT | 0 refills | Status: DC | PRN

## 2022-04-26 NOTE — Telephone Encounter (Signed)
In for visit today  

## 2022-04-26 NOTE — Patient Instructions (Signed)
Jardiance and Wilder Glade -- look into cost  Shortness of Breath, Adult Shortness of breath means you have trouble breathing. Shortness of breath could be a sign of a medical problem. Follow these instructions at home:  Pollution Do not smoke or use any products that contain nicotine or tobacco. If you need help quitting, ask your doctor. Avoid things that can make it harder to breathe, such as: Smoke of all kinds. This includes smoke from campfires or forest fires. Do not smoke or allow others to smoke in your home. Mold. Dust. Air pollution. Chemical smells. Things that can give you an allergic reaction (allergens) if you have allergies. Keep your living space clean. Use products that help remove mold and dust. General instructions Watch for any changes in your symptoms. Take over-the-counter and prescription medicines only as told by your doctor. This includes oxygen therapy and inhaled medicines. Rest as needed. Return to your normal activities when your doctor says that it is safe. Keep all follow-up visits. Contact a doctor if: Your condition does not get better as soon as expected. You have a hard time doing your normal activities, even after you rest. You have new symptoms. You cannot walk up stairs. You cannot exercise the way you normally do. Get help right away if: Your shortness of breath gets worse. You have trouble breathing when you are resting. You feel light-headed or you faint. You have a cough that is not helped by medicines. You cough up blood. You have pain with breathing. You have pain in your chest, arms, shoulders, or belly (abdomen). You have a fever. These symptoms may be an emergency. Get help right away. Call 911. Do not wait to see if the symptoms will go away. Do not drive yourself to the hospital. Summary Shortness of breath is when you have trouble breathing enough air. It can be a sign of a medical problem. Avoid things that make it hard for you  to breathe, such as smoking, pollution, mold, and dust. Watch for any changes in your symptoms. Contact your doctor if you do not get better or you get worse. This information is not intended to replace advice given to you by your health care provider. Make sure you discuss any questions you have with your health care provider. Document Revised: 08/28/2020 Document Reviewed: 08/28/2020 Elsevier Patient Education  [place].

## 2022-04-26 NOTE — Progress Notes (Signed)
BP 129/82   Pulse 88   Temp 98.3 F (36.8 C) (Oral)   Ht 5\' 10"  (1.778 m)   Wt 292 lb 12.8 oz (132.8 kg)   SpO2 94%   BMI 42.01 kg/m    Subjective:    Patient ID: Nicholas [name], male    DOB: 1945/07/13, 77 y.o.   MRN: [id number]  HPI: Nicholas [name] is a 77 y.o. male  Chief Complaint  Patient presents with   Shortness of Breath    Past 2 to 3 days has been having congestion, but SOB has been getting worse for past several weeks.   SHORTNESS OF BREATH Has been having congestion for 2-3 days, but reports having worsening shortness of breath for past several weeks -- seen on 04/03/22 for this and sent to ER where he was evaluated.  Labs showed BNP 34.4, glucose 166 & CT angio noted no PE -- has history of PE and takes Eliquis as ordered. Symptoms improved with Torsemide in ER. Saw cardiology last on 04/04/22 where SGLT2 was recommended due to HF + they started Torsemide daily for 5 days and then every other day for one week -- discontinued fludrocortisone and changes to Proamatine.  Continues to take Torsemide at home dependent on weight per wife.  Had echo done on 04/06/22 -- EF 60-65%, mild left ventricular hypertrophy.  Recent A1c February 2024 = 6.8%.  No history of smoking.    Shortness of breath has worsened since congestion started 2-3 days ago.   Recurrent headaches: no Visual changes: yes -- started 3 weeks ago with some double but has new glasses now and this helped Palpitations: no Dyspnea: yes -- notices this more with activity -- turning over in bed, bending over to pick something up, and drying off in shower  Chest pain: no Lower extremity edema: yes -- every other day, fluid pills help Dizzy/lightheaded: no   Relevant past medical, surgical, family and social history reviewed and updated as indicated. Interim medical history since our last visit reviewed. Allergies and medications reviewed and updated.  Review of Systems  Constitutional:  Negative for activity change,  appetite change, diaphoresis, fatigue and fever.  HENT:  Positive for congestion. Negative for ear discharge, ear pain, postnasal drip, rhinorrhea, sinus pressure, sinus pain and sore throat.   Respiratory:  Positive for cough (x one this morning), chest tightness, shortness of breath and wheezing (has noticed some wheezing).   Cardiovascular:  Positive for leg swelling (taking Torsemide). Negative for chest pain and palpitations.  Gastrointestinal:  Positive for abdominal distention. Negative for abdominal pain, anal bleeding, constipation, diarrhea, nausea and vomiting.  Neurological: Negative.   Psychiatric/Behavioral: Negative.     Per HPI unless specifically indicated above     Objective:    BP 129/82   Pulse 88   Temp 98.3 F (36.8 C) (Oral)   Ht 5\' 10"  (1.778 m)   Wt 292 lb 12.8 oz (132.8 kg)   SpO2 94%   BMI 42.01 kg/m   Wt Readings from Last 3 Encounters:  04/26/22 292 lb 12.8 oz (132.8 kg)  04/04/22 278 lb 6.4 oz (126.3 kg)  04/03/22 292 lb 12.3 oz (132.8 kg)    Physical Exam Vitals and nursing note reviewed.  Constitutional:      General: He is awake. He is not in acute distress.    Appearance: He is well-developed and well-groomed. He is obese. He is not ill-appearing or toxic-appearing.  HENT:     Head:  Normocephalic.     Right Ear: Hearing and external ear normal.     Left Ear: Hearing and external ear normal.  Eyes:     General: Lids are normal.     Extraocular Movements: Extraocular movements intact.     Conjunctiva/sclera: Conjunctivae normal.  Neck:     Thyroid: No thyromegaly.     Vascular: No carotid bruit.  Cardiovascular:     Rate and Rhythm: Normal rate and regular rhythm.     Heart sounds: Normal heart sounds. No murmur heard.    No gallop.  Pulmonary:     Effort: No accessory muscle usage or respiratory distress.     Breath sounds: Examination of the right-lower field reveals rhonchi. Examination of the left-lower field reveals rhonchi.  Rhonchi present. No decreased breath sounds or wheezing.  Abdominal:     General: Bowel sounds are normal. There is no distension or abdominal bruit.     Palpations: Abdomen is soft.     Tenderness: There is no abdominal tenderness.  Musculoskeletal:     Cervical back: Full passive range of motion without pain.     Right lower leg: 1+ Edema present.     Left lower leg: 1+ Edema present.  Lymphadenopathy:     Cervical: No cervical adenopathy.  Skin:    General: Skin is warm.     Capillary Refill: Capillary refill takes less than 2 seconds.  Neurological:     Mental Status: He is alert and oriented to person, place, and time.     Cranial Nerves: Cranial nerves 2-12 are intact.     Gait: Gait is intact.     Deep Tendon Reflexes: Reflexes are normal and symmetric.     Reflex Scores:      Brachioradialis reflexes are 2+ on the right side and 2+ on the left side.      Patellar reflexes are 2+ on the right side and 2+ on the left side. Psychiatric:        Attention and Perception: Attention normal.        Mood and Affect: Mood normal.        Speech: Speech normal.        Behavior: Behavior normal. Behavior is cooperative.        Thought Content: Thought content normal.    Results for orders placed or performed in visit on 04/07/22  Pacemaker Device Observation  Result Value Ref Range   PACEART PDF     DEVICE MODEL PM     BATTERY VOLTAGE        Assessment & Plan:   Problem List Items Addressed This Visit       Other   SOB (shortness of breath) - Primary    Ongoing since 04/03/22, although with recent worsening and new symptoms of congestion present.  Obtain Covid testing today.  Had recent work-up in ER that was reassuring + recent echo, but will recheck CXR due to coarse sounds lower bases noted.  LABS: CBC, CMP, BNP.  Discussed addition of SGLT2, but cost is concern so they wish to speak to insurance first and will alert provider if assistance needed for medication.  At this  time recommend Torsemide daily for next 7 days and then return to office.  This appears to be offering benefit, however would prefer SGLT2 for long term -- educated them on this.  Albuterol inhaler sent to use as needed.  May need to return to pulmonary if ongoing -- ?some decompensation as  sitting more and less active.      Relevant Orders   Novel Coronavirus, NAA ([facility])   DG Chest 2 View   CBC with Differential/Platelet   Comprehensive metabolic panel   B Nat Peptide     Follow up plan: Return in about 1 week (around 05/03/2022) for SOB with exertion.

## 2022-04-26 NOTE — Telephone Encounter (Addendum)
Chief Complaint: SOB Symptoms: chest congestion, cough Frequency: Onset 7 days ago Pertinent Negatives: Patient denies fever, chest pain, other symptoms Disposition: [] ED /[] Urgent Care (no appt availability in office) / [x] Appointment(In office/virtual)/ []  [facility]/ [] Home Care/ [] Refused Recommended Disposition /[] [facility]/ []  Follow-up with PCP Additional Notes: Patient says 7 days ago he started having SOB especially lying down and he started using the CPAP to get air in at night. He says 2-3 days ago he developed chest congestion and cough at times with brown sputum coughed up today. He says any activity causes SOB, but he recovers once he is sitting and resting. He says he rests better sitting in the recliner. Advised he will need to be evaluated in the office, no availability with PCP, agrees any provider to see him, scheduled tomorrow morning per request at [place] with Dierdre Forth, NP. He asked for refill of Lorazepam, advised it was sent in this morning. He and his wife mentions that Dr. Caryl Comes, cardiologist, was supposed to send a note to [facility] about starting on a medication and they would like to know if Jolene could review and let them know about the medication, they spoke with cardiology yesterday. Advised I will send this to her for evaluation, but they could mention to the provider tomorrow.    Reason for Disposition  [1] MODERATE longstanding difficulty breathing (e.g., speaks in phrases, SOB even at rest, pulse 100-120) AND [2] SAME as normal  Answer Assessment - Initial Assessment Questions 1. RESPIRATORY STATUS: "Describe your breathing?" (e.g., wheezing, shortness of breath, unable to speak, severe coughing)      SOB 2. ONSET: "When did this breathing problem begin?"      7 days ago it started, last 2-3 days congestion started and that made it worse 3. PATTERN "Does the difficult breathing come and go, or has it been constant since it started?"      With  any type exertion/activity 4. SEVERITY: "How bad is your breathing?" (e.g., mild, moderate, severe)    - MILD: No SOB at rest, mild SOB with walking, speaks normally in sentences, can lie down, no retractions, pulse < 100.    - MODERATE: SOB at rest, SOB with minimal exertion and prefers to sit, cannot lie down flat, speaks in phrases, mild retractions, audible wheezing, pulse 100-120.    - SEVERE: Very SOB at rest, speaks in single words, struggling to breathe, sitting hunched forward, retractions, pulse > 120      Moderate to Severe 5. RECURRENT SYMPTOM: "Have you had difficulty breathing before?" If Yes, ask: "When was the last time?" and "What happened that time?"      Yes 6. CARDIAC HISTORY: "Do you have any history of heart disease?" (e.g., heart attack, angina, bypass surgery, angioplasty)      Yes 7. LUNG HISTORY: "Do you have any history of lung disease?"  (e.g., pulmonary embolus, asthma, emphysema)     Yes, sleep apnea 8. CAUSE: "What do you think is causing the breathing problem?"       I don't really know 9. OTHER SYMPTOMS: "Do you have any other symptoms? (e.g., dizziness, runny nose, cough, chest pain, fever)     Chest congestion, cough (brown phlegm) 10. O2 SATURATION MONITOR:  "Do you use an oxygen saturation monitor (pulse oximeter) at home?" If Yes, ask: "What is your reading (oxygen level) today?" "What is your usual oxygen saturation reading?" (e.g., 95%)       No  Protocols used: Breathing  Difficulty-A-AH

## 2022-04-26 NOTE — Progress Notes (Signed)
Remote pacemaker transmission.   

## 2022-04-26 NOTE — Assessment & Plan Note (Signed)
Ongoing since 04/03/22, although with recent worsening and new symptoms of congestion present.  Obtain Covid testing today.  Had recent work-up in ER that was reassuring + recent echo, but will recheck CXR due to coarse sounds lower bases noted.  LABS: CBC, CMP, BNP.  Discussed addition of SGLT2, but cost is concern so they wish to speak to insurance first and will alert provider if assistance needed for medication.  At this time recommend Torsemide daily for next 7 days and then return to office.  This appears to be offering benefit, however would prefer SGLT2 for long term -- educated them on this.  Albuterol inhaler sent to use as needed.  May need to return to pulmonary if ongoing -- ?some decompensation as sitting more and less active.

## 2022-04-27 ENCOUNTER — Ambulatory Visit: Payer: Medicare Other | Admitting: Family Medicine

## 2022-04-27 LAB — CBC WITH DIFFERENTIAL/PLATELET
Basophils Absolute: 0.1 10*3/uL (ref 0.0–0.2)
Basos: 1 %
EOS (ABSOLUTE): 0.3 10*3/uL (ref 0.0–0.4)
Eos: 4 %
Hematocrit: 43.4 % (ref 37.5–51.0)
Hemoglobin: 14.9 g/dL (ref 13.0–17.7)
Immature Grans (Abs): 0 10*3/uL (ref 0.0–0.1)
Immature Granulocytes: 1 %
Lymphocytes Absolute: 0.8 10*3/uL (ref 0.7–3.1)
Lymphs: 13 %
MCH: 31.7 pg (ref 26.6–33.0)
MCHC: 34.3 g/dL (ref 31.5–35.7)
MCV: 92 fL (ref 79–97)
Monocytes Absolute: 0.7 10*3/uL (ref 0.1–0.9)
Monocytes: 11 %
Neutrophils Absolute: 4.4 10*3/uL (ref 1.4–7.0)
Neutrophils: 70 %
Platelets: 168 10*3/uL (ref 150–450)
RBC: 4.7 x10E6/uL (ref 4.14–5.80)
RDW: 12.7 % (ref 11.6–15.4)
WBC: 6.3 10*3/uL (ref 3.4–10.8)

## 2022-04-27 LAB — COMPREHENSIVE METABOLIC PANEL
ALT: 15 IU/L (ref 0–44)
AST: 27 IU/L (ref 0–40)
Albumin/Globulin Ratio: 1.9 (ref 1.2–2.2)
Albumin: 4.6 g/dL (ref 3.8–4.8)
Alkaline Phosphatase: 72 IU/L (ref 44–121)
BUN/Creatinine Ratio: 19 (ref 10–24)
BUN: 23 mg/dL (ref 8–27)
Bilirubin Total: 1.2 mg/dL (ref 0.0–1.2)
CO2: 24 mmol/L (ref 20–29)
Calcium: 9.8 mg/dL (ref 8.6–10.2)
Chloride: 99 mmol/L (ref 96–106)
Creatinine, Ser: 1.21 mg/dL (ref 0.76–1.27)
Globulin, Total: 2.4 g/dL (ref 1.5–4.5)
Glucose: 170 mg/dL — ABNORMAL HIGH (ref 70–99)
Potassium: 4.3 mmol/L (ref 3.5–5.2)
Sodium: 140 mmol/L (ref 134–144)
Total Protein: 7 g/dL (ref 6.0–8.5)
eGFR: 62 mL/min/{1.73_m2} (ref >59)

## 2022-04-27 LAB — NOVEL CORONAVIRUS, NAA: SARS-CoV-2, NAA: NOT DETECTED

## 2022-04-27 LAB — BRAIN NATRIURETIC PEPTIDE: BNP: 23.2 pg/mL (ref 0.0–100.0)

## 2022-04-27 NOTE — Progress Notes (Signed)
Contacted via MyChart   Good afternoon Nicholas [name], your labs have returned.  Overall these all look great, including BNP with no elevation.  Glucose is elevated.  I would recommend we try to get you on Jardiance or Farxiga like we discussed to help diabetes and sugars come down.  Have you looked into cost of these?  Do we need to involve assistance team for this?  Let me know. Keep being amazing!!  Thank you for allowing me to participate in your care.  I appreciate you. Kindest regards, Harutyun Monteverde

## 2022-04-28 ENCOUNTER — Other Ambulatory Visit
Admission: RE | Admit: 2022-04-28 | Discharge: 2022-04-28 | Disposition: A | Discharge status: Home / Self Care | Payer: Medicare Other | Attending: Internal Medicine | Admitting: Internal Medicine

## 2022-04-28 ENCOUNTER — Encounter: Payer: Self-pay | Admitting: Nurse Practitioner

## 2022-04-28 DIAGNOSIS — Z79899 Other long term (current) drug therapy: Secondary | ICD-10-CM | POA: Insufficient documentation

## 2022-04-28 DIAGNOSIS — Z950 Presence of cardiac pacemaker: Secondary | ICD-10-CM | POA: Diagnosis present

## 2022-04-28 DIAGNOSIS — I442 Atrioventricular block, complete: Secondary | ICD-10-CM | POA: Insufficient documentation

## 2022-04-28 DIAGNOSIS — I4819 Other persistent atrial fibrillation: Secondary | ICD-10-CM | POA: Insufficient documentation

## 2022-04-28 DIAGNOSIS — I951 Orthostatic hypotension: Secondary | ICD-10-CM | POA: Diagnosis present

## 2022-04-28 LAB — BASIC METABOLIC PANEL
Anion gap: 12 (ref 5–15)
BUN: 29 mg/dL — ABNORMAL HIGH (ref 8–23)
CO2: 31 mmol/L (ref 22–32)
Calcium: 9 mg/dL (ref 8.9–10.3)
Chloride: 95 mmol/L — ABNORMAL LOW (ref 98–111)
Creatinine, Ser: 1.42 mg/dL — ABNORMAL HIGH (ref 0.61–1.24)
GFR, Estimated: 51 mL/min — ABNORMAL LOW (ref >60)
Glucose, Bld: 182 mg/dL — ABNORMAL HIGH (ref 70–99)
Potassium: 3.9 mmol/L (ref 3.5–5.1)
Sodium: 138 mmol/L (ref 135–145)

## 2022-04-29 ENCOUNTER — Other Ambulatory Visit: Payer: Self-pay | Admitting: Nurse Practitioner

## 2022-04-29 MED ORDER — AZITHROMYCIN 250 MG PO TABS: ORAL_TABLET | ORAL | 0 refills | Status: AC

## 2022-04-29 MED ORDER — EMPAGLIFLOZIN 25 MG PO TABS: 25.0000 mg | ORAL_TABLET | Freq: Every day | ORAL | 4 refills | Status: DC

## 2022-04-29 MED ORDER — AMOXICILLIN-POT CLAVULANATE 875-125 MG PO TABS: 1.0000 | ORAL_TABLET | Freq: Two times a day (BID) | ORAL | 0 refills | Status: DC

## 2022-04-29 NOTE — Progress Notes (Signed)
Contacted via MyChart   Good evening Nicholas [name], your imaging returned.  There is an "opacity" present which could represent either fluid or infection.  I am going to be cautious with this since we have been giving Torsemide and he is still not 100%, which should be helping with fluid.  I would continue this until Monday or Tuesday and start the Jardiance I sent in, then stop the Torsemide as I do not want him get too dehydrated either with adding on Jardiance.  I have also sent in antibiotics to treat pneumonia as I am a bit concerned for this, will treat as community acquired pneumonia with two antibiotics.  Then we will recheck next week.  If any worsening over weekend then immediately go to ER.  Any questions? Keep being stellar!!  Thank you for allowing me to participate in your care.  I appreciate you. Kindest regards, Tyrel Lex

## 2022-04-30 NOTE — Patient Instructions (Signed)

## 2022-05-04 ENCOUNTER — Encounter: Payer: Self-pay | Admitting: Nurse Practitioner

## 2022-05-04 ENCOUNTER — Ambulatory Visit (INDEPENDENT_AMBULATORY_CARE_PROVIDER_SITE_OTHER): Payer: Medicare Other | Admitting: Nurse Practitioner

## 2022-05-04 VITALS — BP 138/83 | HR 91 | Temp 98.0°F | Ht 70.0 in | Wt 287.1 lb

## 2022-05-04 DIAGNOSIS — R0602 Shortness of breath: Secondary | ICD-10-CM

## 2022-05-04 DIAGNOSIS — R052 Subacute cough: Secondary | ICD-10-CM | POA: Diagnosis not present

## 2022-05-04 DIAGNOSIS — R059 Cough, unspecified: Secondary | ICD-10-CM | POA: Insufficient documentation

## 2022-05-04 NOTE — Assessment & Plan Note (Addendum)
Ongoing with opacities noted recent imaging ?Fluid vs infection -- he has lost 5 pounds since last visit with Torsemide burst and addition of Jardiance.  Continues to have ongoing symptoms and on exam is cold/clammy today + O2 sats dropping to mid-80's with walk test.  Reports weakness and dizziness.  At this time has been treated for CAP and diuretic care for fluid, however not improving.  Have recommended ER visit today as concern he may need IV treatment at this point and further work-up.

## 2022-05-04 NOTE — Assessment & Plan Note (Signed)
Ongoing since 04/03/22, refer to CAP plan of care.

## 2022-05-04 NOTE — Progress Notes (Signed)
BP 138/83   Pulse 91   Temp 98 F (36.7 C) (Oral)   Ht 5\' 10"  (1.778 m)   Wt 287 lb 1.6 oz (130.2 kg)   SpO2 91%   BMI 41.19 kg/m    Subjective:    Patient ID: Nicholas [name], male    DOB: 08/19/1945, 77 y.o.   MRN: [id number]  HPI: Nicholas SawyersCoy T Hadlock is a 77 y.o. male  Chief Complaint  Patient presents with   SOB with exertion    Here for follow up, Still having a hard time breathing, having a lot of coughing and wheezing.   COUGH Follow-up today for cough and SOB.  Seen last 04/26/22 and imaging done noting mild diffuse interstitial opacity -- consistent with edema or atypical infection.  Was treated with Torsemide + started Jardiance after discussion with cardiology due to heart and diabetes.  Was given Augmentin for 7 days and Zpack outpatient for concern for CAP.  Wife stopped Torsemide on Sunday.  He reports good urination pattern -- has been urinating frequently.  Has ongoing symptoms, weakness, SOB, dizziness. Duration: weeks Circumstances of initial development of cough: unknown Cough severity: moderate Cough description: productive Aggravating factors:  worse in the AM, talking, and exercise Alleviating factors: sitting and not moving Status:  fluctuating Treatments attempted: antibiotics, diuretic as above Wheezing: yes Shortness of breath: yes Chest pain: no Chest tightness:yes Nasal congestion: yes Runny nose: yes Postnasal drip: no Frequent throat clearing or swallowing: yes Hemoptysis: no Fevers: no Night sweats: no Weight loss:  with diuretic Heartburn: no Recent foreign travel: no Tuberculosis contacts: no   Relevant past medical, surgical, family and social history reviewed and updated as indicated. Interim medical history since our last visit reviewed. Allergies and medications reviewed and updated.  Review of Systems  Constitutional:  Positive for fatigue. Negative for activity change, appetite change, chills, diaphoresis and fever.  HENT:  Positive  for congestion, postnasal drip and rhinorrhea. Negative for ear discharge, ear pain, sinus pressure, sinus pain, sneezing, sore throat and voice change.   Respiratory:  Positive for cough, chest tightness, shortness of breath and wheezing.   Cardiovascular:  Negative for chest pain, palpitations and leg swelling.  Gastrointestinal: Negative.   Neurological:  Positive for dizziness, weakness and light-headedness. Negative for headaches.  Psychiatric/Behavioral: Negative.      Per HPI unless specifically indicated above     Objective:    BP 138/83   Pulse 91   Temp 98 F (36.7 C) (Oral)   Ht 5\' 10"  (1.778 m)   Wt 287 lb 1.6 oz (130.2 kg)   SpO2 91%   BMI 41.19 kg/m   Wt Readings from Last 3 Encounters:  05/04/22 287 lb 1.6 oz (130.2 kg)  04/26/22 292 lb 12.8 oz (132.8 kg)  04/04/22 278 lb 6.4 oz (126.3 kg)    Physical Exam Vitals and nursing note reviewed.  Constitutional:      General: He is awake. He is not in acute distress.    Appearance: He is well-developed and well-groomed. He is obese. He is not ill-appearing or toxic-appearing.     Comments: Skin cold and clammy to touch.  HENT:     Head: Normocephalic.     Right Ear: Hearing and external ear normal.     Left Ear: Hearing and external ear normal.  Eyes:     General: Lids are normal.     Extraocular Movements: Extraocular movements intact.     Conjunctiva/sclera: Conjunctivae normal.  Neck:     Thyroid: No thyromegaly.     Vascular: No carotid bruit.  Cardiovascular:     Rate and Rhythm: Normal rate and regular rhythm.     Heart sounds: Normal heart sounds. No murmur heard.    No gallop.  Pulmonary:     Effort: No accessory muscle usage or respiratory distress.     Breath sounds: Examination of the right-lower field reveals rhonchi. Examination of the left-lower field reveals rhonchi. Wheezing and rhonchi present. No decreased breath sounds.     Comments: Expiratory wheezes noted throughout with rhonchi to  lower fields.  Walk test in office with O2 sats dropping into mid-80 range and reports of SOB. Abdominal:     General: Bowel sounds are normal. There is no distension or abdominal bruit.     Palpations: Abdomen is soft.     Tenderness: There is no abdominal tenderness.  Musculoskeletal:     Cervical back: Full passive range of motion without pain.     Right lower leg: No edema.     Left lower leg: No edema.  Lymphadenopathy:     Cervical: No cervical adenopathy.  Skin:    General: Skin is warm.     Capillary Refill: Capillary refill takes less than 2 seconds.  Neurological:     Mental Status: He is alert and oriented to person, place, and time.     Cranial Nerves: Cranial nerves 2-12 are intact.     Gait: Gait is intact.     Deep Tendon Reflexes: Reflexes are normal and symmetric.     Reflex Scores:      Brachioradialis reflexes are 2+ on the right side and 2+ on the left side.      Patellar reflexes are 2+ on the right side and 2+ on the left side. Psychiatric:        Attention and Perception: Attention normal.        Mood and Affect: Mood normal.        Speech: Speech normal.        Behavior: Behavior normal. Behavior is cooperative.        Thought Content: Thought content normal.    Results for orders placed or performed during the hospital encounter of 04/28/22  Basic metabolic panel  Result Value Ref Range   Sodium 138 135 - 145 mmol/L   Potassium 3.9 3.5 - 5.1 mmol/L   Chloride 95 (L) 98 - 111 mmol/L   CO2 31 22 - 32 mmol/L   Glucose, Bld 182 (H) 70 - 99 mg/dL   BUN 29 (H) 8 - 23 mg/dL   Creatinine, Ser 5.49 (H) 0.61 - 1.24 mg/dL   Calcium 9.0 8.9 - 82.6 mg/dL   GFR, Estimated 51 (L) >60 mL/min   Anion gap 12 5 - 15      Assessment & Plan:   Problem List Items Addressed This Visit       Other   Cough - Primary    Ongoing with opacities noted recent imaging ?Fluid vs infection -- he has lost 5 pounds since last visit with Torsemide burst and addition of  Jardiance.  Continues to have ongoing symptoms and on exam is cold/clammy today + O2 sats dropping to mid-80's with walk test.  Reports weakness and dizziness.  At this time has been treated for CAP and diuretic care for fluid, however not improving.  Have recommended ER visit today as concern he may need IV treatment at this point and  further work-up.        SOB (shortness of breath)    Ongoing since 04/03/22, refer to CAP plan of care.       Time: 25 minutes, >50% spent counseling/or care coordination   Follow up plan: Return in about 1 week (around 05/11/2022) for PNEUMONIA.

## 2022-05-06 NOTE — Patient Instructions (Addendum)
B12 1000 MCG a day oral  Cough, Adult A cough helps to clear your throat and lungs. It may be a sign of an illness or another condition. A short-term (acute) cough may last 2-3 weeks. A long-term (chronic) cough may last 8 or more weeks. Many things can cause a cough. They include: Illnesses such as: An infection in your throat or lungs. Asthma or other heart or lung problems. Gastroesophageal reflux. This is when acid comes back up from your stomach. Breathing in things that bother (irritate) your lungs. Allergies. Postnasal drip. This is when mucus runs down the back of your throat. Smoking. Some medicines. Follow these instructions at home: Medicines Take over-the-counter and prescription medicines only as told by your doctor. Talk with your doctor before you take cough medicine (cough suppressants). Eating and drinking Do not drink alcohol. Do not drink caffeine. Drink enough fluid to keep your pee (urine) pale yellow. Lifestyle Stay away from cigarette smoke. Do not smoke or use any products that contain nicotine or tobacco. If you need help quitting, ask your doctor. Stay away from things that make you cough. These may include perfume, candles, cleaning products, or campfire smoke. General instructions  Watch for any changes to your cough. Tell your doctor about them. Always cover your mouth when you cough. If the air is dry in your home, use a cool mist vaporizer or humidifier. If your cough is worse at night, try using extra pillows to raise your head up higher while you sleep. Rest as needed. Contact a doctor if: You have new symptoms. Your symptoms get worse. You cough up pus. You have a fever that does not go away. Your cough does not get better after 2-3 weeks. Cough medicine does not help, and you are not sleeping well. You have pain that gets worse or is not helped with medicine. You are losing weight and do not know why. You have night sweats. Get help right  away if: You cough up blood. You have trouble breathing. Your heart is beating very fast. These symptoms may be an emergency. Get help right away. Call 911. Do not wait to see if the symptoms will go away. Do not drive yourself to the hospital. This information is not intended to replace advice given to you by your health care provider. Make sure you discuss any questions you have with your health care provider. Document Revised: 09/09/2021 Document Reviewed: 09/09/2021 Elsevier Patient Education  2023 [employer].

## 2022-05-08 ENCOUNTER — Telehealth: Payer: Self-pay

## 2022-05-08 NOTE — Telephone Encounter (Signed)
-----   Message from Pablo Ledger, [place] sent at 05/08/2022  4:22 PM EDT -----

## 2022-05-08 NOTE — Transitions of Care (Post Inpatient/ED Visit) (Signed)
   05/08/2022  Name: Nicholas [name] MRN: [id number] DOB: 19-May-1945  Today's TOC FU Call Status: Today's TOC FU Call Status:: Successful TOC FU Call Competed TOC FU Call Complete Date: 05/08/22  Transition Care Management Follow-up Telephone Call Date of Discharge: 05/04/22 Discharge Facility: Other ([facility]) Name of Other ([facility]) Discharge Facility: [facility] Type of Discharge: Emergency Department Reason for ED Visit: Respiratory How have you been since you were released from the hospital?: Better Any questions or concerns?: No  Items Reviewed: Did you receive and understand the discharge instructions provided?: Yes Medications obtained and verified?: Yes (Medications Reviewed) Any new allergies since your discharge?: No Dietary orders reviewed?: No Do you have support at home?: Yes People in Home: spouse  Home Care and Equipment/Supplies: Were Home Health Services Ordered?: NA Any new equipment or medical supplies ordered?: NA Were you able to get the equipment/medical supplies?: No Do you have any questions related to the use of the equipment/supplies?: No  Functional Questionnaire: Do you need assistance with bathing/showering or dressing?: No Do you need assistance with meal preparation?: No Do you need assistance with eating?: No Do you have difficulty maintaining continence: No Do you need assistance with getting out of bed/getting out of a chair/moving?: No Do you have difficulty managing or taking your medications?: No  Follow up appointments reviewed: PCP Follow-up appointment confirmed?: Yes Date of PCP follow-up appointment?: 05/12/22 Follow-up Provider: [facility] Follow-up appointment confirmed?: Yes Reason Specialist Follow-Up Not Confirmed: Patient has Specialist Provider Number and will Call for Appointment Do you understand care options if your condition(s) worsen?: Yes-patient verbalized  understanding    SIGNATURE:  Leward Quan, [facility]

## 2022-05-10 ENCOUNTER — Telehealth: Payer: Self-pay

## 2022-05-10 NOTE — Progress Notes (Signed)
  Chronic Care Management Note  05/10/2022 Name: [name] MRN: [id number] DOB: 28-May-1945  Nicholas [name] is a 77 y.o. year old male who is a primary care patient of Marjie Skiff, NP and is actively engaged with the Chronic Care Management team. I reached out to Nicholas [name] by phone today to assist with re-scheduling a follow up visit with the RN Case Manager  Follow up plan: Unsuccessful telephone outreach attempt made. A HIPAA compliant phone message was left for the patient providing contact information and requesting a return call.  The care management team will reach out to the patient again over the next 7 days.  If patient returns call to provider office, please advise to call CCM Care Guide Nicholas [name]  at [phone]  Nicholas [name], RMA Care Guide [facility]  [place], [place] [address] Direct Dial: [phone] [name]

## 2022-05-11 LAB — HM DIABETES EYE EXAM

## 2022-05-12 ENCOUNTER — Telehealth: Payer: Self-pay | Admitting: Nurse Practitioner

## 2022-05-12 ENCOUNTER — Encounter: Payer: Self-pay | Admitting: Nurse Practitioner

## 2022-05-12 ENCOUNTER — Ambulatory Visit (INDEPENDENT_AMBULATORY_CARE_PROVIDER_SITE_OTHER): Payer: Medicare Other | Admitting: Nurse Practitioner

## 2022-05-12 VITALS — BP 139/90 | HR 74 | Temp 98.0°F | Resp 18 | Ht 70.0 in | Wt 285.6 lb

## 2022-05-12 DIAGNOSIS — R052 Subacute cough: Secondary | ICD-10-CM

## 2022-05-12 DIAGNOSIS — R0602 Shortness of breath: Secondary | ICD-10-CM

## 2022-05-12 NOTE — Assessment & Plan Note (Signed)
Ongoing, will get into pulmonary for further assessment.  [facility] concerned for diaphragm paralysis based on imaging they performed and recommended pulmonary assessment.

## 2022-05-12 NOTE — Assessment & Plan Note (Signed)
Acute and improved.  Opacities noted recent imaging ?Fluid vs infection -- he has lost weight with Jardiance.  Overall feeling better with exception of some weakness.  Recommend plenty of rest and fluids at home.

## 2022-05-12 NOTE — Progress Notes (Signed)
BP (!) 139/90   Pulse 74   Temp 98 F (36.7 C)   Resp 18   Ht  (1.778 m)   Wt 285 lb 9.6 oz (129.5 kg)   SpO2 95%   BMI 40.98 kg/m    Subjective:    Patient ID: Nicholas [name], male    DOB: October 12, 1945, 77 y.o.   MRN: [id number]  HPI: Nicholas [name] is a 77 y.o. male  Chief Complaint  Patient presents with   Cough   COUGH Follow-up today for cough and SOB, seen last 05/04/22 and was feeling worse.  Went to [facility] ER with overall reassuring assessment, provided Albuterol nebulizer and placed a pulmonary referral.  Imaging at [facility] note moderate elevation of right hemidiaphragm.    On 04/26/22 seen and imaging done noting mild diffuse interstitial opacity -- consistent with edema or atypical infection.  Was treated with Torsemide (concern for fluid build up) + started Jardiance after discussion with cardiology due to heart and diabetes.  Was given Augmentin for 7 days and Zpack outpatient for concern for CAP.   Today he reports starting to feel better, but some weakness remains.  Not coughing and no tightness in chest. Duration: weeks Circumstances of initial development of cough: unknown Cough severity: none Cough description: none Aggravating factors:  worse in the AM, talking, and exercise Alleviating factors: sitting and not moving Status: improved Treatments attempted: antibiotics, diuretic as above Wheezing: no Shortness of breath: no Chest pain: no Chest tightness: no Nasal congestion: no Runny nose: no Postnasal drip: no Frequent throat clearing or swallowing: no Hemoptysis: no Fevers: no Night sweats: no Weight loss:  with diuretic Heartburn: no Recent foreign travel: no Tuberculosis contacts: no   Relevant past medical, surgical, family and social history reviewed and updated as indicated. Interim medical history since our last visit reviewed. Allergies and medications reviewed and updated.  Review of Systems  Constitutional:  Negative for activity change,  appetite change, chills, diaphoresis, fatigue and fever.  HENT: Negative.    Respiratory:  Negative for cough, chest tightness, shortness of breath and wheezing.   Cardiovascular:  Negative for chest pain, palpitations and leg swelling.  Gastrointestinal: Negative.   Neurological:  Positive for weakness. Negative for dizziness, light-headedness and headaches.  Psychiatric/Behavioral: Negative.      Per HPI unless specifically indicated above     Objective:    BP (!) 139/90   Pulse 74   Temp 98 F (36.7 C)   Resp 18   Ht  (1.778 m)   Wt 285 lb 9.6 oz (129.5 kg)   SpO2 95%   BMI 40.98 kg/m   Wt Readings from Last 3 Encounters:  05/12/22 285 lb 9.6 oz (129.5 kg)  05/04/22 287 lb 1.6 oz (130.2 kg)  04/26/22 292 lb 12.8 oz (132.8 kg)    Physical Exam Vitals and nursing note reviewed.  Constitutional:      General: He is awake. He is not in acute distress.    Appearance: He is well-developed and well-groomed. He is obese. He is not ill-appearing or toxic-appearing.     Comments: Skin cold and clammy to touch.  HENT:     Head: Normocephalic.     Right Ear: Hearing and external ear normal.     Left Ear: Hearing and external ear normal.  Eyes:     General: Lids are normal.     Extraocular Movements: Extraocular movements intact.     Conjunctiva/sclera: Conjunctivae normal.  Neck:     Thyroid: No thyromegaly.     Vascular: No carotid bruit.  Cardiovascular:     Rate and Rhythm: Normal rate and regular rhythm.     Heart sounds: Normal heart sounds. No murmur heard.    No gallop.  Pulmonary:     Effort: No accessory muscle usage or respiratory distress.     Breath sounds: No decreased breath sounds, wheezing or rhonchi.     Comments: Lung sounds overall clear on exam today. Abdominal:     General: Bowel sounds are normal. There is no distension or abdominal bruit.     Palpations: Abdomen is soft.     Tenderness: There is no abdominal tenderness.  Musculoskeletal:      Cervical back: Full passive range of motion without pain.     Right lower leg: No edema.     Left lower leg: No edema.  Lymphadenopathy:     Cervical: No cervical adenopathy.  Skin:    General: Skin is warm.     Capillary Refill: Capillary refill takes less than 2 seconds.  Neurological:     Mental Status: He is alert and oriented to person, place, and time.     Cranial Nerves: Cranial nerves 2-12 are intact.     Gait: Gait is intact.     Deep Tendon Reflexes: Reflexes are normal and symmetric.     Reflex Scores:      Brachioradialis reflexes are 2+ on the right side and 2+ on the left side.      Patellar reflexes are 2+ on the right side and 2+ on the left side. Psychiatric:        Attention and Perception: Attention normal.        Mood and Affect: Mood normal.        Speech: Speech normal.        Behavior: Behavior normal. Behavior is cooperative.        Thought Content: Thought content normal.    Results for orders placed or performed during the hospital encounter of 04/28/22  Basic metabolic panel  Result Value Ref Range   Sodium 138 135 - 145 mmol/L   Potassium 3.9 3.5 - 5.1 mmol/L   Chloride 95 (L) 98 - 111 mmol/L   CO2 31 22 - 32 mmol/L   Glucose, Bld 182 (H) 70 - 99 mg/dL   BUN 29 (H) 8 - 23 mg/dL   Creatinine, Ser 1.61 (H) 0.61 - 1.24 mg/dL   Calcium 9.0 8.9 - 09.6 mg/dL   GFR, Estimated 51 (L) >60 mL/min   Anion gap 12 5 - 15      Assessment & Plan:   Problem List Items Addressed This Visit       Other   Cough    Acute and improved.  Opacities noted recent imaging ?Fluid vs infection -- he has lost weight with Jardiance.  Overall feeling better with exception of some weakness.  Recommend plenty of rest and fluids at home.      SOB (shortness of breath) - Primary    Ongoing, will get into pulmonary for further assessment.  [facility] concerned for diaphragm paralysis based on imaging they performed and recommended pulmonary assessment.      Relevant Orders    Ambulatory referral to Pulmonology     Follow up plan: Return for as scheduled in May.

## 2022-05-12 NOTE — Telephone Encounter (Signed)
Copied from CRM [id number]. Topic: Medicare AWV >> May 12, 2022  1:32 PM Payton Doughty wrote: Reason for CRM: Called patient to schedule Medicare Annual Wellness Visit (AWV). Left message for patient to call back and schedule Medicare Annual Wellness Visit (AWV).  Last date of AWV: 05/02/21  Please schedule an appointment at any time with Kennedy Bucker, LPN  .  If any questions, please contact me.  Thank you ,  Verlee Rossetti; Care Guide Ambulatory Clinical Support [facility] Direct Dial: [phone]

## 2022-05-16 ENCOUNTER — Other Ambulatory Visit: Payer: Self-pay | Admitting: Nurse Practitioner

## 2022-05-16 NOTE — Telephone Encounter (Signed)
Requested Prescriptions  Pending Prescriptions Disp Refills   rosuvastatin (CRESTOR) 40 MG tablet [Pharmacy Med Name: ROSUVASTATIN CALCIUM 40 MG TAB] 90 tablet 1    Sig: TAKE 1 TABLET BY MOUTH EVERY DAY     Cardiovascular:  Antilipid - Statins 2 Failed - 05/16/2022  1:44 AM      Failed - Cr in normal range and within 360 days    Creatinine  Date Value Ref Range Status  02/26/2014 0.94 0.60 - 1.30 mg/dL Final   Creatinine, Ser  Date Value Ref Range Status  04/28/2022 1.42 (H) 0.61 - 1.24 mg/dL Final         Failed - Lipid Panel in normal range within the last 12 months    Cholesterol, Total  Date Value Ref Range Status  03/01/2022 134 100 - 199 mg/dL Final   Cholesterol Piccolo, Waived  Date Value Ref Range Status  01/03/2018 198 <200 mg/dL Final    Comment:                            Desirable                <200                         Borderline High      200- 239                         High                     >239    LDL Chol Calc (NIH)  Date Value Ref Range Status  03/01/2022 58 0 - 99 mg/dL Final   HDL  Date Value Ref Range Status  03/01/2022 58 >39 mg/dL Final   Triglycerides  Date Value Ref Range Status  03/01/2022 97 0 - 149 mg/dL Final   Triglycerides Piccolo,Waived  Date Value Ref Range Status  01/03/2018 264 (H) <150 mg/dL Final    Comment:                            Normal                   <150                         Borderline High     150 - 199                         High                200 - 499                         Very High                >499          Passed - Patient is not pregnant      Passed - Valid encounter within last 12 months    Recent Outpatient Visits           4 days ago SOB (shortness of breath)   [place] [facility] [place], Corrie Dandy T, NP   1 week ago Subacute cough   [place] [facility] [place], Corrie Dandy T, NP   2 weeks ago SOB (shortness of breath)   [place] [facility] [place], Corrie Dandy T, NP   1 month ago SOB (shortness of breath)   [place] [facility] [place], Megan P, DO   2 months ago Type 2 diabetes mellitus with obesity (HCC)   [facility] [place], [name], NP       Future Appointments             In 2 weeks [place], [name], NP [facility]   In 10 months Vanna Scotland, MD [facility]

## 2022-05-17 ENCOUNTER — Other Ambulatory Visit: Payer: Self-pay | Admitting: Nurse Practitioner

## 2022-05-17 DIAGNOSIS — F339 Major depressive disorder, recurrent, unspecified: Secondary | ICD-10-CM

## 2022-05-17 DIAGNOSIS — F419 Anxiety disorder, unspecified: Secondary | ICD-10-CM

## 2022-05-17 NOTE — Telephone Encounter (Signed)
Requested medication (s) are due for refill today: yes  Requested medication (s) are on the active medication list: yes  Last refill:  03/29/21  Future visit scheduled: yes  Notes to clinic:  Unable to refill per protocol, cannot delegate.      Requested Prescriptions  Pending Prescriptions Disp Refills   QUEtiapine Fumarate (SEROQUEL XR) 150 MG 24 hr tablet [Pharmacy Med Name: QUETIAPINE ER 150 MG TABLET] 90 tablet 4    Sig: TAKE 1 TABLET BY MOUTH EVERYDAY AT BEDTIME     Not Delegated - Psychiatry:  Antipsychotics - Second Generation (Atypical) - quetiapine Failed - 05/17/2022  1:37 AM      Failed - This refill cannot be delegated      Failed - Last BP in normal range    BP Readings from Last 1 Encounters:  05/12/22 (!) 139/90         Failed - Lipid Panel in normal range within the last 12 months    Cholesterol, Total  Date Value Ref Range Status  03/01/2022 134 100 - 199 mg/dL Final   Cholesterol Piccolo, Waived  Date Value Ref Range Status  01/03/2018 198 <200 mg/dL Final    Comment:                            Desirable                <200                         Borderline High      200- 239                         High                     >239    LDL Chol Calc (NIH)  Date Value Ref Range Status  03/01/2022 58 0 - 99 mg/dL Final   HDL  Date Value Ref Range Status  03/01/2022 58 >39 mg/dL Final   Triglycerides  Date Value Ref Range Status  03/01/2022 97 0 - 149 mg/dL Final   Triglycerides Piccolo,Waived  Date Value Ref Range Status  01/03/2018 264 (H) <150 mg/dL Final    Comment:                            Normal                   <150                         Borderline High     150 - 199                         High                200 - 499                         Very High                >499          Passed - TSH in normal range and within 360 days    TSH  Date Value Ref Range Status  03/01/2022  1.230 0.450 - 4.500 uIU/mL Final         Passed  - Completed PHQ-2 or PHQ-9 in the last 360 days      Passed - Last Heart Rate in normal range    Pulse Readings from Last 1 Encounters:  05/12/22 74         Passed - Valid encounter within last 6 months    Recent Outpatient Visits           5 days ago SOB (shortness of breath)   [facility] [place], Corrie Dandy T, NP   1 week ago Subacute cough   [facility] [place], Armington T, NP   3 weeks ago SOB (shortness of breath)   [place] [facility] [place], Corrie Dandy T, NP   1 month ago SOB (shortness of breath)   [place] [facility] [place], Megan P, DO   2 months ago Type 2 diabetes mellitus with obesity (HCC)   [place] [facility] [place], Corrie Dandy T, NP       Future Appointments             In 1 week Marjie Skiff, NP [facility]   In 10 months Vanna Scotland, MD [facility] [place]            Passed - CBC within normal limits and completed in the last 12 months    WBC  Date Value Ref Range Status  04/26/2022 6.3 3.4 - 10.8 x10E3/uL Final  04/03/2022 6.3 4.0 - 10.5 K/uL Final   RBC  Date Value Ref Range Status  04/26/2022 4.70 4.14 - 5.80 x10E6/uL Final  04/03/2022 5.12 4.22 - 5.81 MIL/uL Final   Hemoglobin  Date Value Ref Range Status  04/26/2022 14.9 13.0 - 17.7 g/dL Final   Hematocrit  Date Value Ref Range Status  04/26/2022 43.4 37.5 - 51.0 % Final   MCHC  Date Value Ref Range Status  04/26/2022 34.3 31.5 - 35.7 g/dL Final  57/84/6962 95.2 30.0 - 36.0 g/dL Final   [facility]  Date Value Ref Range Status  04/26/2022 31.7 26.6 - 33.0 pg Final  04/03/2022 30.3 26.0 - 34.0 pg Final   MCV  Date Value Ref Range Status  04/26/2022 92 79 - 97 fL Final  02/26/2014 88 80 - 100 fL Final   No results found for: "PLTCOUNTKUC", "LABPLAT", "POCPLA" RDW  Date Value Ref Range Status  04/26/2022 12.7 11.6 - 15.4 % Final  02/26/2014  14.0 11.5 - 14.5 % Final         Passed - CMP within normal limits and completed in the last 12 months    Albumin  Date Value Ref Range Status  04/26/2022 4.6 3.8 - 4.8 g/dL Final  84/13/2440 4.3 3.4 - 5.0 g/dL Final   Alkaline Phosphatase  Date Value Ref Range Status  04/26/2022 72 44 - 121 IU/L Final  04/07/2013 45 Unit/L Final    Comment:    45-117 NOTE: New Reference Range 12/13/12    ALT  Date Value Ref Range Status  04/26/2022 15 0 - 44 IU/L Final   SGPT (ALT)  Date Value Ref Range Status  04/07/2013 22 12 - 78 U/L Final   ALT (SGPT) Piccolo, Waived  Date Value Ref Range Status  01/03/2018 24 10 - 47 U/L Final   AST  Date Value Ref Range Status  04/26/2022 27 0 - 40 IU/L Final  SGOT(AST)  Date Value Ref Range Status  04/07/2013 28 15 - 37 Unit/L Final   AST (SGOT) Piccolo, Waived  Date Value Ref Range Status  01/03/2018 34 11 - 38 U/L Final   BUN  Date Value Ref Range Status  04/28/2022 29 (H) 8 - 23 mg/dL Final  09/81/1914 23 8 - 27 mg/dL Final  78/29/5621 20 (H) 7 - 18 mg/dL Final   Calcium  Date Value Ref Range Status  04/28/2022 9.0 8.9 - 10.3 mg/dL Final   Calcium, Total  Date Value Ref Range Status  02/26/2014 8.4 (L) 8.5 - 10.1 mg/dL Final   Calcium, Ion  Date Value Ref Range Status  04/17/2013 1.20 1.13 - 1.30 mmol/L Final   CO2  Date Value Ref Range Status  04/28/2022 31 22 - 32 mmol/L Final   Co2  Date Value Ref Range Status  02/26/2014 25 21 - 32 mmol/L Final   TCO2  Date Value Ref Range Status  04/17/2013 21 0 - 100 mmol/L Final   Creatinine  Date Value Ref Range Status  02/26/2014 0.94 0.60 - 1.30 mg/dL Final   Creatinine, Ser  Date Value Ref Range Status  04/28/2022 1.42 (H) 0.61 - 1.24 mg/dL Final   Glucose  Date Value Ref Range Status  02/26/2014 137 (H) 65 - 99 mg/dL Final   Glucose, Bld  Date Value Ref Range Status  04/28/2022 182 (H) 70 - 99 mg/dL Final    Comment:    Glucose reference range applies  only to samples taken after fasting for at least 8 hours.   Glucose-Capillary  Date Value Ref Range Status  04/17/2013 102 (H) 70 - 99 mg/dL Final   Potassium  Date Value Ref Range Status  04/28/2022 3.9 3.5 - 5.1 mmol/L Final  02/26/2014 3.8 3.5 - 5.1 mmol/L Final   Sodium  Date Value Ref Range Status  04/28/2022 138 135 - 145 mmol/L Final  04/26/2022 140 134 - 144 mmol/L Final  02/26/2014 137 136 - 145 mmol/L Final   Bilirubin,Total  Date Value Ref Range Status  04/07/2013 0.5 0.2 - 1.0 mg/dL Final   Bilirubin Total  Date Value Ref Range Status  04/26/2022 1.2 0.0 - 1.2 mg/dL Final   Bilirubin, Direct  Date Value Ref Range Status  04/07/2013 <0.2 0.0 - 0.3 mg/dL Final   Indirect Bilirubin  Date Value Ref Range Status  04/07/2013 NOT CALCULATED 0.3 - 0.9 mg/dL Final   Protein, ur  Date Value Ref Range Status  01/05/2018 100 (A) NEGATIVE mg/dL Final   Protein,UA  Date Value Ref Range Status  08/24/2021 Trace (A) Negative/Trace Final   Total Protein  Date Value Ref Range Status  04/26/2022 7.0 6.0 - 8.5 g/dL Final  30/86/5784 7.6 6.4 - 8.2 g/dL Final   EGFR (African American)  Date Value Ref Range Status  02/26/2014 >60 >16mL/min Final  04/07/2013 >60  Final   GFR calc Af Amer  Date Value Ref Range Status  02/11/2020 60 >59 mL/min/1.73 Final    Comment:    **In accordance with recommendations from the NKF-ASN Task force,**   [facility] is in the process of updating its eGFR calculation to the   2021 CKD-EPI creatinine equation that estimates kidney function   without a race variable.    eGFR  Date Value Ref Range Status  04/26/2022 62 >59 mL/min/1.73 Final   EGFR (Non-African Amer.)  Date Value Ref Range Status  02/26/2014 >60 >82mL/min Final    Comment:  eGFR values <7mL/min/1.73 m2 may be an indication of chronic kidney disease (CKD). Calculated eGFR, using the MRDR Study equation, is useful in  patients with stable renal function. The eGFR  calculation will not be reliable in acutely ill patients when serum creatinine is changing rapidly. It is not useful in patients on dialysis. The eGFR calculation may not be applicable to patients at the low and high extremes of body sizes, pregnant women, and vegetarians.   04/07/2013 59 (L)  Final    Comment:    eGFR values <37mL/min/1.73 m2 may be an indication of chronic kidney disease (CKD). Calculated eGFR is useful in patients with stable renal function. The eGFR calculation will not be reliable in acutely ill patients when serum creatinine is changing rapidly. It is not useful in  patients on dialysis. The eGFR calculation may not be applicable to patients at the low and high extremes of body sizes, pregnant women, and vegetarians.    GFR, Estimated  Date Value Ref Range Status  04/28/2022 51 (L) >60 mL/min Final    Comment:    (NOTE) Calculated using the CKD-EPI Creatinine Equation (2021)

## 2022-05-23 ENCOUNTER — Ambulatory Visit: Payer: Medicare Other | Attending: Anesthesiology | Admitting: Anesthesiology

## 2022-05-23 ENCOUNTER — Telehealth: Payer: Self-pay | Admitting: Nurse Practitioner

## 2022-05-23 ENCOUNTER — Encounter: Payer: Self-pay | Admitting: Anesthesiology

## 2022-05-23 DIAGNOSIS — R1031 Right lower quadrant pain: Secondary | ICD-10-CM

## 2022-05-23 DIAGNOSIS — M62830 Muscle spasm of back: Secondary | ICD-10-CM

## 2022-05-23 DIAGNOSIS — M1611 Unilateral primary osteoarthritis, right hip: Secondary | ICD-10-CM

## 2022-05-23 DIAGNOSIS — G894 Chronic pain syndrome: Secondary | ICD-10-CM

## 2022-05-23 DIAGNOSIS — M5432 Sciatica, left side: Secondary | ICD-10-CM | POA: Diagnosis not present

## 2022-05-23 DIAGNOSIS — G8929 Other chronic pain: Secondary | ICD-10-CM

## 2022-05-23 DIAGNOSIS — F1190 Opioid use, unspecified, uncomplicated: Secondary | ICD-10-CM

## 2022-05-23 DIAGNOSIS — M4726 Other spondylosis with radiculopathy, lumbar region: Secondary | ICD-10-CM

## 2022-05-23 DIAGNOSIS — M79641 Pain in right hand: Secondary | ICD-10-CM

## 2022-05-23 DIAGNOSIS — M5431 Sciatica, right side: Secondary | ICD-10-CM

## 2022-05-23 DIAGNOSIS — M79642 Pain in left hand: Secondary | ICD-10-CM

## 2022-05-23 DIAGNOSIS — M5136 Other intervertebral disc degeneration, lumbar region: Secondary | ICD-10-CM | POA: Diagnosis not present

## 2022-05-23 DIAGNOSIS — M47816 Spondylosis without myelopathy or radiculopathy, lumbar region: Secondary | ICD-10-CM

## 2022-05-23 DIAGNOSIS — M5416 Radiculopathy, lumbar region: Secondary | ICD-10-CM

## 2022-05-23 MED ORDER — HYDROCODONE-ACETAMINOPHEN 5-325 MG PO TABS: 1.0000 | ORAL_TABLET | Freq: Four times a day (QID) | ORAL | 0 refills | Status: AC | PRN

## 2022-05-23 NOTE — Progress Notes (Signed)
Virtual Visit via Telephone Note  I connected with Nicholas [name] on 05/23/22 at  1:20 PM EDT by telephone and verified that I am speaking with the correct person using two identifiers.  Location: Patient: Home Provider: Pain control center   I discussed the limitations, risks, security and privacy concerns of performing an evaluation and management service by telephone and the availability of in person appointments. I also discussed with the patient that there may be a patient responsible charge related to this service. The patient expressed understanding and agreed to proceed.   History of Present Illness: I spoke with Nicholas [name] via telephone as we were unable link for the video portion of the conference.  He reports that his low back has been stable with no recent changes.  He still gets some sciatica symptoms but generally these have been stable and somewhat quiet.  He has been less active.  He is undergoing the stress of the recent loss of a grand son.  This has been a challenge for obvious reasons.  He is taking his medications as prescribed and he is overdue for these.  He has been using a half of his Vicodin tablet on average right now to help until he could get in for an evaluation.  When he takes his medicines he is generally getting about 75 to 80% of his chronic low back pain and leg pain.  Unfortunately nothing else has worked.  He has been through physical therapy injection therapy and conservative care without significant relief.  Medicines continue to enable him to stay functional active and sleep better.  No side effects are reported.  The quality of his low back pain has been stable.  No change in lower extremity strength or function is noted.  Review of systems: General: No fevers or chills Pulmonary: No shortness of breath or dyspnea Cardiac: No angina or palpitations or lightheadedness GI: No abdominal pain or constipation Psych: No depression     Observations/Objective:  Current Outpatient Medications:    HYDROcodone-acetaminophen (NORCO/VICODIN) 5-325 MG tablet, Take 1 tablet by mouth every 6 (six) hours as needed for moderate pain or severe pain., Disp: 60 tablet, Rfl: 0   [START ON 06/21/2022] HYDROcodone-acetaminophen (NORCO/VICODIN) 5-325 MG tablet, Take 1 tablet by mouth every 6 (six) hours as needed for moderate pain or severe pain., Disp: 60 tablet, Rfl: 0   acetaminophen (TYLENOL) 650 MG CR tablet, Take 650 mg by mouth every 8 (eight) hours as needed for pain., Disp: , Rfl:    albuterol (VENTOLIN HFA) 108 (90 Base) MCG/ACT inhaler, Inhale 2 puffs into the lungs every 6 (six) hours as needed., Disp: 18 g, Rfl: 0   Ascorbic Acid (VITAMIN C) 1000 MG tablet, Take 1,000 mg by mouth daily., Disp: , Rfl:    Blood Glucose Monitoring Suppl (ONETOUCH VERIO) w/Device KIT, Use to check blood sugar 3 times a day and document results, bring to appointments.  Goal is <130 fasting blood sugar and <180 two hours after meals., Disp: 1 kit, Rfl: 0   calcium-vitamin D (OSCAL WITH D) 500-5 MG-MCG tablet, Take 1 tablet by mouth daily with breakfast., Disp: , Rfl:    Cranberry 500 MG CAPS, Take 500 mg by mouth daily., Disp: , Rfl:    ELIQUIS 5 MG TABS tablet, TAKE 1 TABLET BY MOUTH TWICE A DAY, Disp: 180 tablet, Rfl: 1   empagliflozin (JARDIANCE) 25 MG TABS tablet, Take 1 tablet (25 mg total) by mouth daily., Disp: 30 tablet, Rfl: 4  gabapentin (NEURONTIN) 600 MG tablet, TAKE 1 TABLET BY MOUTH EVERYDAY AT BEDTIME, Disp: 90 tablet, Rfl: 4   glucose blood test strip, Use to check blood sugar 3 times a day and document results, bring to appointments.  Goal is <130 fasting blood sugar and <180 two hours after meals., Disp: 100 each, Rfl: 12   Lancets (ONETOUCH ULTRASOFT) lancets, Use to check blood sugar 3 times a day and document results, bring to appointments.  Goal is <130 fasting blood sugar and <180 two hours after meals., Disp: 100 each, Rfl: 12    LORazepam (ATIVAN) 1 MG tablet, TAKE 1 TABLET BY MOUTH EVERYDAY AT BEDTIME, Disp: 90 tablet, Rfl: 0   Magnesium 400 MG TABS, Take 400 mg by mouth daily. , Disp: , Rfl:    MELATONIN PO, Take by mouth., Disp: , Rfl:    midodrine (PROAMATINE) 10 MG tablet, Take 0.5 tablets (5 mg total) by mouth 3 (three) times daily., Disp: 45 tablet, Rfl: 3   Multiple Vitamin (MULTIVITAMIN WITH MINERALS) TABS tablet, Take 1 tablet by mouth daily. Centrum Silver, Disp: , Rfl:    Omega-3 Fatty Acids (FISH OIL) 1200 MG CAPS, Take 1,200 mg by mouth daily., Disp: , Rfl:    oxymetazoline (AFRIN) 0.05 % nasal spray, Place 2 sprays into both nostrils at bedtime., Disp: , Rfl:    pantoprazole (PROTONIX) 20 MG tablet, Take 1 tablet (20 mg total) by mouth 2 (two) times daily before a meal., Disp: 180 tablet, Rfl: 4   potassium chloride SA (KLOR-CON M) 20 MEQ tablet, Take 2 tablets (40 meq) with Torsemide, Disp: 30 tablet, Rfl: 3   QUEtiapine Fumarate (SEROQUEL XR) 150 MG 24 hr tablet, TAKE 1 TABLET BY MOUTH EVERYDAY AT BEDTIME, Disp: 90 tablet, Rfl: 4   rOPINIRole (REQUIP) 0.25 MG tablet, Take 0.25 mg by mouth 4 (four) times daily., Disp: , Rfl:    rosuvastatin (CRESTOR) 40 MG tablet, TAKE 1 TABLET BY MOUTH EVERY DAY, Disp: 90 tablet, Rfl: 1   solifenacin (VESICARE) 10 MG tablet, TAKE 1 TABLET BY MOUTH EVERY DAY, Disp: 90 tablet, Rfl: 3   tiZANidine (ZANAFLEX) 4 MG tablet, Take 1 tablet (4 mg total) by mouth every 6 (six) hours as needed for muscle spasms., Disp: 90 tablet, Rfl: 4   torsemide (DEMADEX) 20 MG tablet, Take 2 tablets (40 mg total) by mouth daily as needed (fluid retention)., Disp: 30 tablet, Rfl: 3   venlafaxine XR (EFFEXOR-XR) 75 MG 24 hr capsule, TAKE 3 CAPSULES BY MOUTH EVERY DAY, Disp: 270 capsule, Rfl: 0  Past Medical History:  Diagnosis Date   Abdominal pain, chronic, right lower quadrant 12/13/2020   Anterior urethral stricture    Anxiety    Arthritis    a. knees, hips, hands;  b. 11/2013 s/p L TKA @  [facility].   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 Chronic pain syndrome   2. Lumbar radiculopathy   3. Degeneration of lumbar intervertebral disc   4. Bilateral sciatica   5.  Chronic, continuous use of opioids   6. Bilateral hand pain   7. Facet syndrome, lumbar   8. Osteoarthritis of right hip, unspecified osteoarthritis type   9. Spasm of muscle of lower back   10. Abdominal pain, chronic, right lower quadrant    This would be considered Tylenol at this point question Does not keep doing based on our conversation today I think is appropriate to refill his medicines for the next 2 months dated for April 30 and May 29.  This will be for 1 tablet hydrocodone twice a day.  No other changes in his pharmacologic regimen will be initiated.  Will have him continue with stretching strengthening exercises.  Will defer on any interventional therapy.  Continue follow-up with his primary care physicians for baseline medical care with scheduled return to clinic in 2 months.  Follow Up Instructions:    I discussed the assessment and treatment plan with the patient. The patient was provided an opportunity to ask questions and all were answered. The patient agreed with the plan and demonstrated an understanding of the instructions.   The patient was advised to call back or seek an in-person evaluation if the symptoms worsen or if the condition fails to improve as anticipated.  I provided 30 minutes of non-face-to-face time during this encounter.   Yevette Edwards, MD

## 2022-05-23 NOTE — Telephone Encounter (Signed)
Pt is calling to schedule AWV Telephone Please advise CB- [phone]

## 2022-05-27 NOTE — Patient Instructions (Signed)
Diabetes Mellitus Basics  Diabetes mellitus, or diabetes, is a long-term (chronic) disease. It occurs when the body does not properly use sugar (glucose) that is released from food after you eat. Diabetes mellitus may be caused by one or both of these problems: Your pancreas does not make enough of a hormone called insulin. Your body does not react in a normal way to the insulin that it makes. Insulin lets glucose enter cells in your body. This gives you energy. If you have diabetes, glucose cannot get into cells. This causes high blood glucose (hyperglycemia). How to treat and manage diabetes You may need to take insulin or other diabetes medicines daily to keep your glucose in balance. If you are prescribed insulin, you will learn how to give yourself insulin by injection. You may need to adjust the amount of insulin you take based on the foods that you eat. You will need to check your blood glucose levels using a glucose monitor as told by your health care provider. The readings can help determine if you have low or high blood glucose. Generally, you should have these blood glucose levels: Before meals (preprandial): 80-130 mg/dL (4.4-7.2 mmol/L). After meals (postprandial): below 180 mg/dL (10 mmol/L). Hemoglobin A1c (HbA1c) level: less than 7%. Your health care provider will set treatment goals for you. Keep all follow-up visits. This is important. Follow these instructions at home: Diabetes medicines Take your diabetes medicines every day as told by your health care provider. List your diabetes medicines here: Name of medicine: ______________________________ Amount (dose): _______________ Time (a.m./p.m.): _______________ Notes: ___________________________________ Name of medicine: ______________________________ Amount (dose): _______________ Time (a.m./p.m.): _______________ Notes: ___________________________________ Name of medicine: ______________________________ Amount (dose):  _______________ Time (a.m./p.m.): _______________ Notes: ___________________________________ Insulin If you use insulin, list the types of insulin you use here: Insulin type: ______________________________ Amount (dose): _______________ Time (a.m./p.m.): _______________Notes: ___________________________________ Insulin type: ______________________________ Amount (dose): _______________ Time (a.m./p.m.): _______________ Notes: ___________________________________ Insulin type: ______________________________ Amount (dose): _______________ Time (a.m./p.m.): _______________ Notes: ___________________________________ Insulin type: ______________________________ Amount (dose): _______________ Time (a.m./p.m.): _______________ Notes: ___________________________________ Insulin type: ______________________________ Amount (dose): _______________ Time (a.m./p.m.): _______________ Notes: ___________________________________ Managing blood glucose  Check your blood glucose levels using a glucose monitor as told by your health care provider. Write down the times that you check your glucose levels here: Time: _______________ Notes: ___________________________________ Time: _______________ Notes: ___________________________________ Time: _______________ Notes: ___________________________________ Time: _______________ Notes: ___________________________________ Time: _______________ Notes: ___________________________________ Time: _______________ Notes: ___________________________________  Low blood glucose Low blood glucose (hypoglycemia) is when glucose is at or below 70 mg/dL (3.9 mmol/L). Symptoms may include: Feeling: Hungry. Sweaty and clammy. Irritable or easily upset. Dizzy. Sleepy. Having: A fast heartbeat. A headache. A change in your vision. Numbness around the mouth, lips, or tongue. Having trouble with: Moving (coordination). Sleeping. Treating low blood glucose To treat low blood  glucose, eat or drink something containing sugar right away. If you can think clearly and swallow safely, follow the 15:15 rule: Take 15 grams of a fast-acting carb (carbohydrate), as told by your health care provider. Some fast-acting carbs are: Glucose tablets: take 3-4 tablets. Hard candy: eat 3-5 pieces. Fruit juice: drink 4 oz (120 mL). Regular (not diet) soda: drink 4-6 oz (120-180 mL). Honey or sugar: eat 1 Tbsp (15 mL). Check your blood glucose levels 15 minutes after you take the carb. If your glucose is still at or below 70 mg/dL (3.9 mmol/L), take 15 grams of a carb again. If your glucose does not go above 70 mg/dL (3.9 mmol/L) after   3 tries, get help right away. After your glucose goes back to normal, eat a meal or a snack within 1 hour. Treating very low blood glucose If your glucose is at or below 54 mg/dL (3 mmol/L), you have very low blood glucose (severe hypoglycemia). This is an emergency. Do not wait to see if the symptoms will go away. Get medical help right away. Call your local emergency services (911 in the [place] Do not drive yourself to the hospital. Questions to ask your health care provider Should I talk with a diabetes educator? What equipment will I need to care for myself at home? What diabetes medicines do I need? When should I take them? How often do I need to check my blood glucose levels? What number can I call if I have questions? When is my follow-up visit? Where can I find a support group for people with diabetes? Where to find more information American Diabetes Association: www.diabetes.org Association of Diabetes Care and Education Specialists: www.diabeteseducator.org Contact a health care provider if: Your blood glucose is at or above 240 mg/dL (13.3 mmol/L) for 2 days in a row. You have been sick or have had a fever for 2 days or more, and you are not getting better. You have any of these problems for more than 6 hours: You cannot eat or  drink. You feel nauseous. You vomit. You have diarrhea. Get help right away if: Your blood glucose is lower than 54 mg/dL (3 mmol/L). You get confused. You have trouble thinking clearly. You have trouble breathing. These symptoms may represent a serious problem that is an emergency. Do not wait to see if the symptoms will go away. Get medical help right away. Call your local emergency services (911 in the [place] Do not drive yourself to the hospital. Summary Diabetes mellitus is a chronic disease that occurs when the body does not properly use sugar (glucose) that is released from food after you eat. Take insulin and diabetes medicines as told. Check your blood glucose every day, as often as told. Keep all follow-up visits. This is important. This information is not intended to replace advice given to you by your health care provider. Make sure you discuss any questions you have with your health care provider. Document Revised: 05/13/2019 Document Reviewed: 05/13/2019 Elsevier Patient Education  2023 [employer] Inc.  

## 2022-05-29 ENCOUNTER — Telehealth: Payer: Self-pay | Admitting: Nurse Practitioner

## 2022-05-29 NOTE — Telephone Encounter (Signed)
Copied from CRM [id number]. Topic: Medicare AWV >> May 29, 2022 10:38 AM Payton Doughty wrote: Reason for CRM: Called patient to schedule Medicare Annual Wellness Visit (AWV). Left message for patient to call back and schedule Medicare Annual Wellness Visit (AWV).  Last date of AWV: 05/02/21  Please schedule an appointment at any time with Kennedy Bucker, LPN  .  If any questions, please contact me.  Thank you ,  Verlee Rossetti; Care Guide Ambulatory Clinical Support [place] [facility] Direct Dial: [phone]

## 2022-05-29 NOTE — Telephone Encounter (Signed)
Contacted [name] to schedule their annual wellness visit. Appointment made for 06/06/2022.  Verlee Rossetti; Care Guide Ambulatory Clinical Support [place] [facility] Direct Dial: [phone]

## 2022-05-30 ENCOUNTER — Encounter: Payer: Self-pay | Admitting: Nurse Practitioner

## 2022-05-30 ENCOUNTER — Ambulatory Visit (INDEPENDENT_AMBULATORY_CARE_PROVIDER_SITE_OTHER): Payer: Medicare Other | Admitting: Nurse Practitioner

## 2022-05-30 VITALS — BP 128/80 | HR 77 | Temp 98.0°F | Ht 70.0 in | Wt 285.4 lb

## 2022-05-30 DIAGNOSIS — I7143 Infrarenal abdominal aortic aneurysm, without rupture: Secondary | ICD-10-CM

## 2022-05-30 DIAGNOSIS — F419 Anxiety disorder, unspecified: Secondary | ICD-10-CM

## 2022-05-30 DIAGNOSIS — F1190 Opioid use, unspecified, uncomplicated: Secondary | ICD-10-CM

## 2022-05-30 DIAGNOSIS — I480 Paroxysmal atrial fibrillation: Secondary | ICD-10-CM

## 2022-05-30 DIAGNOSIS — I442 Atrioventricular block, complete: Secondary | ICD-10-CM

## 2022-05-30 DIAGNOSIS — E1159 Type 2 diabetes mellitus with other circulatory complications: Secondary | ICD-10-CM

## 2022-05-30 DIAGNOSIS — N1831 Chronic kidney disease, stage 3a: Secondary | ICD-10-CM | POA: Diagnosis not present

## 2022-05-30 DIAGNOSIS — G894 Chronic pain syndrome: Secondary | ICD-10-CM

## 2022-05-30 DIAGNOSIS — E1169 Type 2 diabetes mellitus with other specified complication: Secondary | ICD-10-CM

## 2022-05-30 DIAGNOSIS — Z950 Presence of cardiac pacemaker: Secondary | ICD-10-CM

## 2022-05-30 DIAGNOSIS — I152 Hypertension secondary to endocrine disorders: Secondary | ICD-10-CM

## 2022-05-30 DIAGNOSIS — F339 Major depressive disorder, recurrent, unspecified: Secondary | ICD-10-CM

## 2022-05-30 DIAGNOSIS — Z79899 Other long term (current) drug therapy: Secondary | ICD-10-CM

## 2022-05-30 DIAGNOSIS — D6869 Other thrombophilia: Secondary | ICD-10-CM

## 2022-05-30 DIAGNOSIS — G4733 Obstructive sleep apnea (adult) (pediatric): Secondary | ICD-10-CM

## 2022-05-30 DIAGNOSIS — I700 Atherosclerosis of aorta: Secondary | ICD-10-CM

## 2022-05-30 LAB — BAYER DCA HB A1C WAIVED: HB A1C (BAYER DCA - WAIVED): 7 % — ABNORMAL HIGH (ref 4.8–5.6)

## 2022-05-30 NOTE — Assessment & Plan Note (Signed)
Ongoing stage 3a, recheck CMP today and adjust medications as needed.  His recent labs since July 2022 have been within good ranges and improved.  Will discontinue this diagnosis. 

## 2022-05-30 NOTE — Assessment & Plan Note (Signed)
On Eliquis with A-fib.  Continue to monitor closely for bleeding or increased bruising.  CBC annually. ?

## 2022-05-30 NOTE — Assessment & Plan Note (Signed)
Chronic, ongoing.  Discussed at length risk of benzo and opioid use in conjunction with each other.  Recommend he not take the two together at same hour during day or evening. Recommend now that has CPAP and improved sleep trial cut back slowly on Ativan.   Continue to collaborate with CCM team on education.  Provided wife and him with copy of [facility] ben[facility] risk information patient sheet in past and they are aware of risks.  They request 90 day supply, as previous PCP supplied this.  Are aware he does have to return every 3 months for refills.  UDS up to date with pain management.  Refills sent.   

## 2022-05-30 NOTE — Assessment & Plan Note (Signed)
Chronic, ongoing followed by pain clinic, Dr. Adams.  Discussed at length risk of benzo and opioid use in conjunction with each other.  Recommend he not take the two together at same hour during day or evening.   

## 2022-05-30 NOTE — Assessment & Plan Note (Signed)
Has been on Ativan for many years, with trial of cutting back unsuccessful.  At length discussions about risk of opioid and benzo use had with patient and wife by both PCP and CCM PharmD.  Continue these discussions.  Refer to anxiety plan. 

## 2022-05-30 NOTE — Assessment & Plan Note (Signed)
Chronic, ongoing.  Continue collaboration with pain management. 

## 2022-05-30 NOTE — Assessment & Plan Note (Signed)
BMI 40.95 with HTN, A-Fib, Heart Block, and CKD.  Recommended eating smaller high protein, low fat meals more frequently and exercising 30 mins a day 5 times a week with a goal of 10-15lb weight loss in the next 3 months. Patient voiced their understanding and motivation to adhere to these recommendations.

## 2022-05-30 NOTE — Assessment & Plan Note (Signed)
Chronic, ongoing.  Denies SI/HI.  Continue current medication regimen and adjust as needed.  Would benefit from trial off Effexor and trial of SSRI, but refuses this.   

## 2022-05-30 NOTE — Assessment & Plan Note (Signed)
Ongoing and stable.  Have recommended 100% use. 

## 2022-05-30 NOTE — Assessment & Plan Note (Signed)
Chronic, stable.  BP at goal today.  Followed by cardiology.  With orthostatic BP presenting occasionally.  Continue current regimen at this time and have recommended utilizing compression hose at home, agrees to try this.  Continue collaboration with cardiology + neurology and current medication regimen.  Labs: CMP.  Recommend ensuring good fluid intake at home.   

## 2022-05-30 NOTE — Assessment & Plan Note (Signed)
Chronic, ongoing.  Continue current medication regimen and adjust as needed. Lipid panel today. 

## 2022-05-30 NOTE — Assessment & Plan Note (Signed)
Diagnosed on 02/21/21, currently taking Jardiance with tolerance.  A1c 7% today.  Glucometer supplies present, recommend he check sugars at least daily fasting.  Labs today.  Recommend cutting back on snacking during day and minimize sweets.  Return in 3 months.

## 2022-05-30 NOTE — Assessment & Plan Note (Signed)
Chronic, stable, followed by cardiology.  Rate control present.  Continue current medication regimen as prescribed by them.  Labs: CMP.  Ablation last on 05/17/20.  Would benefit repeat echo this year. 

## 2022-05-30 NOTE — Assessment & Plan Note (Signed)
Regular checks with cardiology. 

## 2022-05-30 NOTE — Progress Notes (Signed)
BP 128/80 (BP Location: Left Arm, Patient Position: Sitting, Cuff Size: Normal)   Pulse 77   Temp 98 F (36.7 C) (Oral)   Ht 5\' 10"  (1.778 m)   Wt 285 lb 6.4 oz (129.5 kg)   SpO2 94%   BMI 40.95 kg/m    Subjective:    Patient ID: Nicholas [name], male    DOB: [birth year], 77 y.o.   MRN: [id number]  HPI: Nicholas [name] is a 77 y.o. male  Chief Complaint  Patient presents with   Diabetes    Eye exam requested from [facility]     Hypertension   Hyperlipidemia   Mood   DIABETES A1c 6.8% February.  Taking Jardiance 25 MG daily, which he is tolerating -- he does find it makes him urinate more, so on some days misses it if has something going on (like activity).  Ozempic caused GI symptoms, including increased heart burn.   Hypoglycemic episodes:no Polydipsia/polyuria: no Visual disturbance: no Chest pain: no Paresthesias: no Glucose Monitoring: yes  Accucheck frequency: not checking  Fasting glucose:   Post prandial:  Evening:  Before meals: Taking Insulin?: no  Long acting insulin:  Short acting insulin: Blood Pressure Monitoring: daily Retinal Examination: Up To Date -- Dr. Leonides Cave daughter, macular degeneration started Foot Exam: Up to Date Pneumovax: Up to Date Influenza: Up to Date Aspirin: no   HYPERTENSION / HYPERLIPIDEMIA/A-FIB Saw cardiology 04/28/22 last, to return annually.  Continues on Rosuvastatin, Eliquis, fish oil.  Had ablation on 05/17/20.  Echo last October 2021 noting EF 60-65%., no LVH, and Grade I Diastolic dysfunction.  Last pacemaker check 12/12/21 -- was placed January 2018. OSA present.  Diagnosed June 2022 -- is using CPAP 100% of the time and finds benefit with sleep pattern and this.   Satisfied with current treatment? yes Duration of hypertension: chronic BP monitoring frequency: not checking BP range:  BP medication side effects: no Duration of hyperlipidemia: chronic Cholesterol medication side effects: no Cholesterol supplements: fish  oil Medication compliance: good compliance Aspirin: no Recent stressors: no Recurrent headaches: no Visual changes: no Palpitations: no Dyspnea: if exerting self a lot, even with showering -- sees pulmonary today Chest pain: no Lower extremity edema: occasional, takes Torsemide as needed Dizzy/lightheaded: ongoing since restarting Midodrine, notices it when standing up  CHRONIC KIDNEY DISEASE CKD status: stable Medications renally dose: yes Previous renal evaluation: no Pneumovax:  Up to Date Influenza Vaccine:  Up to Date   DEPRESSION & CHRONIC PAIN Taking Effexor, Seroquel, and Ativan.  Pt and his wife at bedside made aware of risks of benzo medication use to include increased sedation, respiratory suppression, falls, extrapyramidal movements, dependence and cardiovascular events.  Pt and his wife would like to continue treatment as benefit determined to outweigh risk.  Multiple at length discussions with him that he is also on opioid therapy.  Discussed risks with taking these three medications together at same time and recommend to separate them when taking Seroquel, Ativan, and opioid.  Is a [employer].   He has tried in past taking 1/2 tablet Ativan, but this does not work well (per wife and patient) and wishes to maintain current dosing.  Last Ativan fill on PDMP review 04/26/22 and last opioid fill 05/23/22.  Last UDS with pain management on 10/19/21. Saw Dr. Pernell Dupre on 05/23/22. Duration:stable Anxious mood: not as much now Excessive worrying: yes Irritability: yes Sweating: no Nausea: no Palpitations:no Hyperventilation: no Panic attacks: no Agoraphobia: no  Obscessions/compulsions: no Depressed mood:  no    04/26/2022   11:32 AM 12/05/2021    1:06 PM 11/29/2021    8:53 AM 10/18/2021   10:39 AM 08/24/2021    8:38 AM  Depression screen PHQ 2/9  Decreased Interest 2 0 0 0 2  Down, Depressed, Hopeless 2 0 0 0 3  PHQ - 2 Score 4 0 0 0 5  Altered sleeping 3  0  3  Tired, decreased  energy 3  0  3  Change in appetite 0  0  2  Feeling bad or failure about yourself  2  0  3  Trouble concentrating 0  0  1  Moving slowly or fidgety/restless 1  0  0  Suicidal thoughts 0  0  3  PHQ-9 Score 13  0  20  Difficult doing work/chores   Not difficult at all         11/29/2021    8:53 AM 08/24/2021    8:38 AM 05/23/2021    9:05 AM 02/21/2021   11:10 AM  GAD 7 : Generalized Anxiety Score  Nervous, Anxious, on Edge 1 3 0 0  Control/stop worrying 2 3 3  0  Worry too much - different things 1 3 3  0  Trouble relaxing 1 3 1  0  Restless 1 3 0 0  Easily annoyed or irritable 1 3 2  0  Afraid - awful might happen 1 3 2  0  Total GAD 7 Score 8 21 11  0  Anxiety Difficulty Not difficult at all Extremely difficult Somewhat difficult Not difficult at all    Relevant past medical, surgical, family and social history reviewed and updated as indicated. Interim medical history since our last visit reviewed. Allergies and medications reviewed and updated.  Review of Systems  Constitutional:  Negative for activity change, appetite change, fatigue and fever.  Respiratory:  Negative for cough, chest tightness, shortness of breath and wheezing.   Cardiovascular:  Negative for chest pain, palpitations and leg swelling.  Endocrine: Negative for polydipsia, polyphagia and polyuria.  Musculoskeletal:  Positive for arthralgias.  Neurological: Negative.   Psychiatric/Behavioral:  Negative for decreased concentration, self-injury, sleep disturbance and suicidal ideas. The patient is not nervous/anxious.     Per HPI unless specifically indicated above     Objective:    BP 128/80 (BP Location: Left Arm, Patient Position: Sitting, Cuff Size: Normal)   Pulse 77   Temp 98 F (36.7 C) (Oral)   Ht 5\' 10"  (1.778 m)   Wt 285 lb 6.4 oz (129.5 kg)   SpO2 94%   BMI 40.95 kg/m   Wt Readings from Last 3 Encounters:  05/30/22 285 lb 6.4 oz (129.5 kg)  05/12/22 285 lb 9.6 oz (129.5 kg)  05/04/22 287 lb 1.6  oz (130.2 kg)    Physical Exam Vitals and nursing note reviewed.  Constitutional:      General: He is awake. He is not in acute distress.    Appearance: He is well-developed and well-groomed. He is obese.  HENT:     Head: Normocephalic and atraumatic.     Right Ear: Hearing normal. No drainage.     Left Ear: Hearing normal. No drainage.  Eyes:     General: Lids are normal.        Right eye: No discharge.        Left eye: No discharge.     Conjunctiva/sclera: Conjunctivae normal.     Pupils: Pupils are equal, round, and reactive to light.  Neck:  Thyroid: No thyromegaly.     Vascular: No carotid bruit.  Cardiovascular:     Rate and Rhythm: Normal rate and regular rhythm.     Heart sounds: Normal heart sounds, S1 normal and S2 normal. No murmur heard.    No gallop.  Pulmonary:     Effort: Pulmonary effort is normal. No accessory muscle usage or respiratory distress.     Breath sounds: Normal breath sounds.  Abdominal:     General: Bowel sounds are normal. There is no distension.     Palpations: Abdomen is soft.     Tenderness: There is no abdominal tenderness.  Musculoskeletal:        General: Normal range of motion.     Cervical back: Normal range of motion and neck supple.     Right lower leg: No edema.     Left lower leg: No edema.  Skin:    General: Skin is warm and dry.  Neurological:     Mental Status: He is alert and oriented to person, place, and time.  Psychiatric:        Attention and Perception: Attention normal.        Mood and Affect: Mood normal.        Behavior: Behavior normal. Behavior is cooperative.        Thought Content: Thought content normal.    Diabetic Foot Exam - Simple   Simple Foot Form Visual Inspection No deformities, no ulcerations, no other skin breakdown bilaterally: Yes Sensation Testing Intact to touch and monofilament testing bilaterally: Yes Pulse Check Posterior Tibialis and Dorsalis pulse intact bilaterally: Yes Comments      Results for orders placed or performed during the hospital encounter of 04/28/22  Basic metabolic panel  Result Value Ref Range   Sodium 138 135 - 145 mmol/L   Potassium 3.9 3.5 - 5.1 mmol/L   Chloride 95 (L) 98 - 111 mmol/L   CO2 31 22 - 32 mmol/L   Glucose, Bld 182 (H) 70 - 99 mg/dL   BUN 29 (H) 8 - 23 mg/dL   Creatinine, Ser 1.61 (H) 0.61 - 1.24 mg/dL   Calcium 9.0 8.9 - 09.6 mg/dL   GFR, Estimated 51 (L) >60 mL/min   Anion gap 12 5 - 15      Assessment & Plan:   Problem List Items Addressed This Visit       Cardiovascular and Mediastinum   PAF (paroxysmal atrial fibrillation) (HCC) (Chronic)    Chronic, stable, followed by cardiology.  Rate control present.  Continue current medication regimen as prescribed by them.  Labs: CMP.  Ablation last on 05/17/20.  Would benefit repeat echo this year.      Relevant Orders   Comprehensive metabolic panel   Lipid Panel w/o Chol/HDL Ratio   Hypertension associated with diabetes (HCC)    Chronic, stable.  BP at goal today.  Followed by cardiology.  With orthostatic BP presenting occasionally.  Continue current regimen at this time and have recommended utilizing compression hose at home, agrees to try this.  Continue collaboration with cardiology + neurology and current medication regimen.  Labs: CMP.  Recommend ensuring good fluid intake at home.        Relevant Orders   Bayer DCA Hb A1c Waived   Comprehensive metabolic panel     Respiratory   OSA (obstructive sleep apnea)    Ongoing and stable.  Have recommended 100% use.        Endocrine   Hyperlipidemia  associated with type 2 diabetes mellitus (HCC)    Chronic, ongoing.  Continue current medication regimen and adjust as needed. Lipid panel today.      Relevant Orders   Bayer DCA Hb A1c Waived   Comprehensive metabolic panel   Lipid Panel w/o Chol/HDL Ratio   Type 2 diabetes mellitus with obesity (HCC) - Primary    Diagnosed on 02/21/21, currently taking Jardiance  with tolerance.  A1c 7% today.  Glucometer supplies present, recommend he check sugars at least daily fasting.  Labs today.  Recommend cutting back on snacking during day and minimize sweets.  Return in 3 months.      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  His recent labs since July 2022 have been within good ranges and improved.  Will discontinue this diagnosis.      Relevant Orders   Comprehensive metabolic panel     Other   Anxiety (Chronic)    Chronic, ongoing.  Discussed at length risk of benzo and opioid use in conjunction with each other.  Recommend he not take the two together at same hour during day or evening. Recommend now that has CPAP and improved sleep trial cut back slowly on Ativan.   Continue to collaborate with CCM team on education.  Provided wife and him with copy of [facility] ben[facility] risk information patient sheet in past and they are aware of risks.  They request 90 day supply, as previous PCP supplied this.  Are aware he does have to return every 3 months for refills.  UDS up to date with pain management.  Refills sent.        Chronic pain syndrome (Chronic)    Chronic, ongoing followed by pain clinic, Dr. Pernell Dupre.  Discussed at length risk of benzo and opioid use in conjunction with each other.  Recommend he not take the two together at same hour during day or evening.        Depression, recurrent (HCC) (Chronic)    Chronic, ongoing.  Denies SI/HI.  Continue current medication regimen and adjust as needed.  Would benefit from trial off Effexor and trial of SSRI, but refuses this.        Long-term current use of benzodiazepine (Chronic)    Has been on Ativan for many years, with trial of cutting back unsuccessful.  At length discussions about risk of opioid and benzo use had with patient and wife by both PCP and CCM PharmD.  Continue  these discussions.  Refer to anxiety plan.      Morbid obesity (HCC) (Chronic)    BMI 40.95 with HTN, A-Fib, Heart Block, and CKD.  Recommended eating smaller high protein, low fat meals more frequently and exercising 30 mins a day 5 times a week with a goal of 10-15lb weight loss in the next 3 months. Patient voiced their understanding and motivation to adhere to these recommendations.       Pacemaker - MDT (Chronic)    Regular checks with cardiology.      Secondary hypercoagulable state (HCC) (Chronic)    On Eliquis with A-fib.  Continue to monitor closely for bleeding or increased bruising.  CBC annually.      Chronic, continuous use of opioids    Chronic, ongoing.  Continue collaboration with pain management.  Follow up plan: Return in about 3 months (around 08/30/2022) for T2DM, HTN/HLD, MOOD, CKD, GERD.

## 2022-05-30 NOTE — Progress Notes (Signed)
  Chronic Care Management Note  05/30/2022 Name: Nicholas [name] MRN: [id number] DOB: 01-25-1945  Nicholas [name] is a 77 y.o. year old male who is a primary care patient of Marjie Skiff, NP and is actively engaged with the Chronic Care Management team. I reached out to Nicholas [name] by phone today to assist with re-scheduling a follow up visit with the RN Case Manager  Follow up plan: Unsuccessful telephone outreach attempt made. A HIPAA compliant phone message was left for the patient providing contact information and requesting a return call.  The care management team will reach out to the patient again over the next 7 days.  If patient returns call to provider office, please advise to call CCM Care Guide Penne Lash  at [phone]  Penne Lash, RMA Care Guide [facility]  [place], [place] [address] Direct Dial: [phone] [email or web address]

## 2022-05-31 LAB — COMPREHENSIVE METABOLIC PANEL
ALT: 22 IU/L (ref 0–44)
AST: 29 IU/L (ref 0–40)
Albumin/Globulin Ratio: 2 (ref 1.2–2.2)
Albumin: 4.5 g/dL (ref 3.8–4.8)
Alkaline Phosphatase: 62 IU/L (ref 44–121)
BUN/Creatinine Ratio: 14 (ref 10–24)
BUN: 15 mg/dL (ref 8–27)
Bilirubin Total: 0.7 mg/dL (ref 0.0–1.2)
CO2: 21 mmol/L (ref 20–29)
Calcium: 9.5 mg/dL (ref 8.6–10.2)
Chloride: 101 mmol/L (ref 96–106)
Creatinine, Ser: 1.11 mg/dL (ref 0.76–1.27)
Globulin, Total: 2.2 g/dL (ref 1.5–4.5)
Glucose: 172 mg/dL — ABNORMAL HIGH (ref 70–99)
Potassium: 4.6 mmol/L (ref 3.5–5.2)
Sodium: 139 mmol/L (ref 134–144)
Total Protein: 6.7 g/dL (ref 6.0–8.5)
eGFR: 68 mL/min/{1.73_m2} (ref >59)

## 2022-05-31 LAB — LIPID PANEL W/O CHOL/HDL RATIO
Cholesterol, Total: 133 mg/dL (ref 100–199)
HDL: 49 mg/dL (ref >39)
LDL Chol Calc (NIH): 59 mg/dL (ref 0–99)
Triglycerides: 143 mg/dL (ref 0–149)
VLDL Cholesterol Cal: 25 mg/dL (ref 5–40)

## 2022-05-31 NOTE — Progress Notes (Signed)
Contacted via MyChart   Good evening Nicholas [name], your labs have returned: - Kidney function, creatinine and eGFR, remains normal, as is liver function, AST and ALT.  - Cholesterol labs are normal. Keep being amazing!!  Thank you for allowing me to participate in your care.  I appreciate you. Kindest regards, Antonyo Hinderer

## 2022-06-06 ENCOUNTER — Ambulatory Visit (INDEPENDENT_AMBULATORY_CARE_PROVIDER_SITE_OTHER): Payer: Medicare Other

## 2022-06-06 VITALS — Ht 70.0 in | Wt 285.0 lb

## 2022-06-06 DIAGNOSIS — Z0000 Encounter for general adult medical examination without abnormal findings: Secondary | ICD-10-CM

## 2022-06-06 NOTE — Patient Instructions (Signed)
Nicholas [name] , Thank you for taking time to come for your Medicare Wellness Visit. I appreciate your ongoing commitment to your health goals. Please review the following plan we discussed and let me know if I can assist you in the future.   These are the goals we discussed:  Goals       CCM (HTN) EXPECTED OUTCOME: MONITOR, SELF-MANAGE AND REDUCE SYMPTOMS OF HTN      Current Barriers:  Knowledge Deficits related to benefits of taking blood pressures on a regular basis and normal values  Chronic Disease Management support and education needs related to effective management of HTN   BP Readings from Last 3 Encounters:  12/05/21 (!) 140/75  11/29/21 124/78  10/18/21 (!) 164/101     Planned Interventions: Evaluation of current treatment plan related to hypertension self management and patient's adherence to plan as established by provider. The patient denies any issues with HTN or heart health. States he is doing well. Has upcoming appointments with pcp and does not see cardiologist, Dr. Clide Cliff for another year;   Provided education to patient re: stroke prevention, s/s of heart attack and stroke; Reviewed prescribed diet heart healthy/ADA diet. The patient is compliant with a heart healthy/ADA diet. Denies any needs related to dietary restrictions Reviewed medications with patient and discussed importance of compliance. Patient states compliance with medications. Works with pharm D for ongoing support and education.    Discussed plans with patient for ongoing care management follow up and provided patient with direct contact information for care management team; Advised patient, providing education and rationale, to monitor blood pressure daily and record, calling PCP for findings outside established parameters. The patient states that his blood pressures are higher now. He has not had to take the medications to raise his blood pressures. Education on systolic goal of <478 and diastolic of <90.  Denies any issues with hypotension at this time. Has had to take medications in the past Reviewed scheduled/upcoming provider appointments including: 03-01-2022 at 0820 am with the pcp- reminder given today Advised patient to discuss changes in blood pressures or heart health with provider; Provided education on prescribed diet heart healthy/ADA diet;  Discussed complications of poorly controlled blood pressure such as heart disease, stroke, circulatory complications, vision complications, kidney impairment, sexual dysfunction;  Screening for signs and symptoms of depression related to chronic disease state;  Assessed social determinant of health barriers;   Symptom Management: Take medications as prescribed   Attend all scheduled provider appointments Perform all self care activities independently  Call provider office for new concerns or questions  call the Suicide and Crisis Lifeline: 988 call the [place] National Suicide Prevention Lifeline: (619)302-7296 or TTY: (346) 156-3496 TTY (701)886-0460) to talk to a trained counselor call 1-800-273-TALK (toll free, 24 hour hotline) if experiencing a Mental Health or Behavioral Health Crisis  check blood pressure weekly learn about high blood pressure keep a blood pressure log take blood pressure log to all doctor appointments call doctor for signs and symptoms of high blood pressure develop an action plan for high blood pressure keep all doctor appointments take medications for blood pressure exactly as prescribed report new symptoms to your doctor  Follow Up Plan: Telephone follow up appointment with care management team member scheduled for: 04-06-2022 at 1 pm        CCM Expected Outcome:  Monitor, Self-Manage and Reduce Symptoms of Afib      Current Barriers:  Knowledge Deficits related to how to tell if his heart  rate is out of rhythm  with condition of AFIB Chronic Disease Management support and education needs related to effective  management of AFIB  Planned Interventions: Provider order and care plan reviewed. Collaborated with PharmD regarding patient care and plan. Takes Eliquis 5mg  BID. Works with pharm D for ongoing support and education Counseled on increased risk of stroke due to Afib and benefits of anticoagulation for stroke prevention           Reviewed importance of adherence to anticoagulant exactly as prescribed. The patient is compliant with medications. Advised patient to discuss changes in heart rate, changes in activity level, acute onset of shortness of breath, or other questions and concerns related to AFIB with provider Counseled on bleeding risk associated with AFIB and importance of self-monitoring for signs/symptoms of bleeding Counseled on avoidance of NSAIDs due to increased bleeding risk with anticoagulants Counseled on importance of regular laboratory monitoring as prescribed. The patient has lab work as requested by providers.  Counseled on seeking medical attention after a head injury or if there is blood in the urine/stool Afib action plan reviewed. The patient had an ablation 05-17-2020. Denies any acute changes in his AFIB or heart health. Saw cardiologist and does not have to see cardiologist again for a year unless needed before then. The patient states he is doing well and staying in because of the cold weather. Has upcoming follow up with the pcp, reminder given  today.  Screening for signs and symptoms of depression related to chronic disease state Assessed social determinant of health barriers  Symptom Management: Take medications as prescribed   Attend all scheduled provider appointments Call provider office for new concerns or questions  call the Suicide and Crisis Lifeline: 988 call the [place] National Suicide Prevention Lifeline: (682)349-1015 or TTY: 236-365-2887 TTY 323-826-5892) to talk to a trained counselor call 1-800-273-TALK (toll free, 24 hour hotline) if experiencing a  Mental Health or Behavioral Health Crisis  - make a plan to eat healthy - keep all lab appointments - take medicine as prescribed  Follow Up Plan: Telephone follow up appointment with care management team member scheduled for: 04-06-2022 at 1 pm        CCM Expected Outcome:  Monitor, Self-Manage and Reduce Symptoms of Diabetes      Current Barriers:  Knowledge Deficits related to monitoring for changes in blood sugars and the effect of elevated blood sugars on other body systems Chronic Disease Management support and education needs related to effective management of DM Lab Results  Component Value Date   HGBA1C 6.8 (H) 11/29/2021     Planned Interventions: Provided education to patient about basic DM disease process; Reviewed medications with patient and discussed importance of medication adherence. The patient is compliant with medications. Did not tolerate Ozempic. Is currently controlling DM with diet.         Reviewed prescribed diet with patient heart healthy/ADA diet. Patient is mindful of dietary restrictions. The patient denies any issues with dietary restrictions ; Counseled on importance of regular laboratory monitoring as prescribed. Has regular lab testing;        Discussed plans with patient for ongoing care management follow up and provided patient with direct contact information for care management team;      Provided patient with written educational materials related to hypo and hyperglycemia and importance of correct treatment;       Reviewed scheduled/upcoming provider appointments including: 03-01-2022 at 820 am. Reminder given today  Advised patient, providing education and rationale, to check cbg when you have symptoms of low or high blood sugar and as directed   and record        Referral made to pharmacy team for assistance with medication reconciliation, education and support. Works with pharm D on a consistent basis;       Review of patient status, including  review of consultants reports, relevant laboratory and other test results, and medications completed;       Advised patient to discuss changes in DM health and well being with provider;      Screening for signs and symptoms of depression related to chronic disease state;        Assessed social determinant of health barriers;       Eye exam completed today (02-09-2022). Denies any concerns with vision and states he received a good report.    Symptom Management: Take medications as prescribed   Attend all scheduled provider appointments Call provider office for new concerns or questions  call the Suicide and Crisis Lifeline: 988 call the [place] National Suicide Prevention Lifeline: 980 726 5394 or TTY: 269-519-3341 TTY 6063337343) to talk to a trained counselor call 1-800-273-TALK (toll free, 24 hour hotline) if experiencing a Mental Health or Behavioral Health Crisis  check feet daily for cuts, sores or redness trim toenails straight across manage portion size wash and dry feet carefully every day wear comfortable, cotton socks wear comfortable, well-fitting shoes  Follow Up Plan: Telephone follow up appointment with care management team member scheduled for: 04-06-2022 at 1 pm        DIET - EAT MORE FRUITS AND VEGETABLES      DIET - INCREASE WATER INTAKE      recommend drinking at least 6-8 glasses of water a day       Patient Stated      04/30/2020, get back to playing golf      Patient Stated      Would like to have energy      PharmD "I need help with Eliquis copay"      CARE PLAN ENTRY (see longitudinal plan of care for additional care plan information)  Current Barriers:  Chronic Disease Management support, education, and care coordination needs related to Hypertension, Hyperlipidemia, Atrial Fibrillation, and Coronary Artery Disease Eliquis copay recently increased causing financial strain.    Hypertension with orthostatic hypotension BP Readings from Last 3 Encounters:   07/01/19 128/78  03/26/19 127/82  03/25/19 110/72  Currently taking midodrine 5 mg bid.   Hyperlipidemia Lab Results  Component Value Date/Time   LDLCALC 86 07/01/2019 04:31 PM  Currently taking simvastatin 20 mg daily and fish oil 1200 mg qd.   AFIB with H/O PE and DVT Flecainide 100 mg bid Eliquis 5 mg bid    : 21Per Mrs. Torpey's report Nicholas [name] is doing well on above regimen. Their copayment for Eliquis has recently gone from $40 to >$100 and she assumes this is because of the coverage gap.  Patient and spouse income exceed limits for PAP. Options include use of free 30 day voucher for Eliquis  this is only available once per lifetime). Another option is converting to Xarelto for $85/month through [employer]. Per Register, patient is not opposed to switching agents if necessary.  Will follow up with patient next week and coordinate with Cardiology for authorization to switch to Xarelto if preferable to patient.60   Pharmacist Clinical Goal(s): Over the next 90 days, patient will work with  PharmD and providers to maintain BP goal <130/80, achieve LDL goal< 70, increase access to Eliquis or other alternative Interventions: Comprehensive medication review performed, medication list updated in electronic medical record Inter-disciplinary care team collaboration (see longitudinal plan of care)  Will pursue affordable option for anticoagulation Patient self care activities - Over the next 90days, patient will: Check BP  twice daily document, and provide at future appointments Ensure daily salt intake < 2300 mg/day Focus on medication adherence by utilizing pill box Take medications as prescribed Report any questions or concerns to PharmD and/or provider(s)  Initial goal documentation       PharmD "I want to stay healthy" (pt-stated)      Current Barriers:  Polypharmacy; complex patient with multiple comorbidities including chronic pain, anxiety, Afib, hx PE, heart  block 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 Notes he has tried to apply for VA benefits "a few times", with no success.  His wife helps w/ management of regimen Afib/ hx PE: flecanide 100 mg BID, Eliquis 5 mg BID Chronic pain: hydrocodone/APAP 5/325 mg BID, gabapentin 400 mg QPM, diclofenac 1% cream. Follows w/ pain management Dr. Pernell Dupre.  Anxiety/depression/sleep: venlafaxine 225 mg daily, quetiapine 150 mg QPM, lorazepam 1 mg QPM, melatonin 10 mg QPM. Notes that he is unable to sleep without lorazepam. No hx trazodone.  Denies caffeine in the afternoons, is not using screens/watching TV in bed. Does note napping more during the day lately, due to being "stuck at home" and not being out playing golf as much OAB: solifenacin 10 mg daily, hyoscyamine 0.125 mg PRN bladder spasms ASCVD risk reduction: simvastatin 20 mg QPM, omega 3 fatty acids 1200 mg daily Suspected tinea pedis. No improvement w/ miconazole, patient has seen dermatology and is using OTC terbinafine topical. Notes that he is supposed to call dermatology if no improvement in a week to discuss oral terbinafine therapy Nasal congestion: Afrin QPM at bedtime. He notes no other nasal sprays have helped relieve his bedtime congestion. No daytime symptoms GERD: pantoprazole 40 mg daily  Supplements: Vitamin C, cranberry, vitamin B12, MVI, probiotic  Pharmacist Clinical Goal(s):  Over the next 90 days, patient will work with PharmD and provider towards optimized medication management  Interventions: Comprehensive medication review performed; medication list updated in electronic medical record Reviewed risks of combination benzodiazepine/opioid therapies. Discussed goal to minimize lorazepam need. Recommended he try 1/2 lorazepam tablet (0.5 mg) for sleep, instead of a full tablet. Discussed good sleep hygiene habits. SNRI may be more activating than other antidepressant options. Could consider trying SSRI; appears  to have hx fluoxetine, but no others. No hx trazodone. He would like an appointment w/ PCP to discuss options. Will collaborate w/ administrative staff to outreach to schedule on a Wednesday when I am here.  Discussed rebound congestion w/ Afrin, however, patient has been on this medication for years.  Discussed oral terbinafine. He notes that dermatology noted the medication could impact his liver. Encouraged him to call dermatology to follow up.  Will collaborate w/ LCSW to outreach patient for support in pursuing veterans services  Patient Self Care Activities:  Patient will take medications as prescribed  Initial goal documentation       SW: We need more help in the home for Tommy (pt-stated)      CARE PLAN ENTRY (see longtitudinal plan of care for additional care plan information)  Current Barriers:  Financial constraints related to managing health care expenses Limited social support ADL IADL  limitations Social Isolation Limited access to caregiver Inability to perform ADL's independently Inability to perform IADL's independently  Clinical Social Work Clinical Goal(s):  Over the next 120 days, patient will work with SW to address concerns related to gaining additional support within the home and [place] benefits  Interventions: Patient interviewed and appropriate assessments performed Provided patient with information about how to apply for VA benefits Discussed plans with patient for ongoing care management follow up and provided patient with direct contact information for care management team Advised patient to check email for sent community resources. Spouse agreeabel to contact [employer]. Resources discussed-  [employer] in [employer].  [employer] in [place] [employer] is a Dentist where veterans are assisted and educated on obtaining benefits in which they earn serving their country. Here you, the veteran, are our priority. [employer] prides itself on  offering the personal assistant and care, so many veterans need. Mail address: [address] [address] [place] [address] Physical address: [address] [address] Contact person: Aundria Mems Phone: [phone] Email: [email or web address] [facility] [address] [place], [place] [address] [phone] [facility] Benefit Office [address] [place], [place] [address] Monday-Thursday, 8am-4pm Friday, 9am-4pm [employer] [place]  [phone] As a [place] wartime Thomasene Ripple or spouse, you can earn up to $3,032 a month TAX FREE to help you pay for your Senior Living. As a wartime hero, you've earned the Aid and Attendance benefit through the [place] Department of [employer]. [employer] helps you streamline your VA benefit application and cuts through the government red tape to properly serve you. Every month, thousands of U.[place] wartime heroes (and widows) collect millions of dollars paying for their Senior Living expenses. Email sent on 03/31/19 Assisted patient/caregiver with obtaining information about health plan benefits Provided education and assistance to client regarding Advanced Directives. Provided education to patient/caregiver regarding level of care options. Provided education to patient/caregiver about Hospice and/or Palliative Care services  Patient Self Care Activities:  Attends all scheduled provider appointments Calls provider office for new concerns or questions Lacks social connections  Initial goal documentation         This is a list of the screening recommended for you and due dates:  Health Maintenance  Topic Date Due   COVID-19 Vaccine (5 - 2023-24 season) 12/15/2021   Eye exam for diabetics  05/13/2022   Flu Shot  08/24/2022   Hemoglobin A1C  11/30/2022   Yearly kidney health urinalysis for diabetes  03/02/2023   Yearly kidney function blood test for diabetes  05/30/2023   Complete foot exam   05/30/2023   Medicare Annual  Wellness Visit  06/06/2023   DTaP/Tdap/Td vaccine (3 - Td or Tdap) 06/12/2027   Pneumonia Vaccine  Completed   Hepatitis C Screening: USPSTF Recommendation to screen - Ages 74-79 yo.  Completed   Zoster (Shingles) Vaccine  Completed   HPV Vaccine  Aged Out   Colon Cancer Screening  Discontinued    Advanced directives: no  Conditions/risks identified: none  Next appointment: Follow up in one year for your annual wellness visit. 06/12/23 @ 8:45 am by phone  Preventive Care 65 Years and Older, Male  Preventive care refers to lifestyle choices and visits with your health care provider that can promote health and wellness. What does preventive care include? A yearly physical exam. This is also called an annual well check. Dental exams once or twice a year. Routine eye exams. Ask your health  care provider how often you should have your eyes checked. Personal lifestyle choices, including: Daily care of your teeth and gums. Regular physical activity. Eating a healthy diet. Avoiding tobacco and drug use. Limiting alcohol use. Practicing safe sex. Taking low doses of aspirin every day. Taking vitamin and mineral supplements as recommended by your health care provider. What happens during an annual well check? The services and screenings done by your health care provider during your annual well check will depend on your age, overall health, lifestyle risk factors, and family history of disease. Counseling  Your health care provider may ask you questions about your: Alcohol use. Tobacco use. Drug use. Emotional well-being. Home and relationship well-being. Sexual activity. Eating habits. History of falls. Memory and ability to understand (cognition). Work and work Astronomer. Screening  You may have the following tests or measurements: Height, weight, and BMI. Blood pressure. Lipid and cholesterol levels. These may be checked every 5 years, or more frequently if you are over 30  years old. Skin check. Lung cancer screening. You may have this screening every year starting at age 94 if you have a 30-pack-year history of smoking and currently smoke or have quit within the past 15 years. Fecal occult blood test (FOBT) of the stool. You may have this test every year starting at age 68. Flexible sigmoidoscopy or colonoscopy. You may have a sigmoidoscopy every 5 years or a colonoscopy every 10 years starting at age 58. Prostate cancer screening. Recommendations will vary depending on your family history and other risks. Hepatitis C blood test. Hepatitis B blood test. Sexually transmitted disease (STD) testing. Diabetes screening. This is done by checking your blood sugar (glucose) after you have not eaten for a while (fasting). You may have this done every 1-3 years. Abdominal aortic aneurysm (AAA) screening. You may need this if you are a current or former smoker. Osteoporosis. You may be screened starting at age 88 if you are at high risk. Talk with your health care provider about your test results, treatment options, and if necessary, the need for more tests. Vaccines  Your health care provider may recommend certain vaccines, such as: Influenza vaccine. This is recommended every year. Tetanus, diphtheria, and acellular pertussis (Tdap, Td) vaccine. You may need a Td booster every 10 years. Zoster vaccine. You may need this after age 12. Pneumococcal 13-valent conjugate (PCV13) vaccine. One dose is recommended after age 60. Pneumococcal polysaccharide (PPSV23) vaccine. One dose is recommended after age 72. Talk to your health care provider about which screenings and vaccines you need and how often you need them. This information is not intended to replace advice given to you by your health care provider. Make sure you discuss any questions you have with your health care provider. Document Released: 02/05/2015 Document Revised: 09/29/2015 Document Reviewed:  11/10/2014 Elsevier Interactive Patient Education  2017 [employer].  Fall Prevention in the Home Falls can cause injuries. They can happen to people of all ages. There are many things you can do to make your home safe and to help prevent falls. What can I do on the outside of my home? Regularly fix the edges of walkways and driveways and fix any cracks. Remove anything that might make you trip as you walk through a door, such as a raised step or threshold. Trim any bushes or trees on the path to your home. Use bright outdoor lighting. Clear any walking paths of anything that might make someone trip, such as rocks or tools. Regularly  check to see if handrails are loose or broken. Make sure that both sides of any steps have handrails. Any raised decks and porches should have guardrails on the edges. Have any leaves, snow, or ice cleared regularly. Use sand or salt on walking paths during winter. Clean up any spills in your garage right away. This includes oil or grease spills. What can I do in the bathroom? Use night lights. Install grab bars by the toilet and in the tub and shower. Do not use towel bars as grab bars. Use non-skid mats or decals in the tub or shower. If you need to sit down in the shower, use a plastic, non-slip stool. Keep the floor dry. Clean up any water that spills on the floor as soon as it happens. Remove soap buildup in the tub or shower regularly. Attach bath mats securely with double-sided non-slip rug tape. Do not have throw rugs and other things on the floor that can make you trip. What can I do in the bedroom? Use night lights. Make sure that you have a light by your bed that is easy to reach. Do not use any sheets or blankets that are too big for your bed. They should not hang down onto the floor. Have a firm chair that has side arms. You can use this for support while you get dressed. Do not have throw rugs and other things on the floor that can make you  trip. What can I do in the kitchen? Clean up any spills right away. Avoid walking on wet floors. Keep items that you use a lot in easy-to-reach places. If you need to reach something above you, use a strong step stool that has a grab bar. Keep electrical cords out of the way. Do not use floor polish or wax that makes floors slippery. If you must use wax, use non-skid floor wax. Do not have throw rugs and other things on the floor that can make you trip. What can I do with my stairs? Do not leave any items on the stairs. Make sure that there are handrails on both sides of the stairs and use them. Fix handrails that are broken or loose. Make sure that handrails are as long as the stairways. Check any carpeting to make sure that it is firmly attached to the stairs. Fix any carpet that is loose or worn. Avoid having throw rugs at the top or bottom of the stairs. If you do have throw rugs, attach them to the floor with carpet tape. Make sure that you have a light switch at the top of the stairs and the bottom of the stairs. If you do not have them, ask someone to add them for you. What else can I do to help prevent falls? Wear shoes that: Do not have high heels. Have rubber bottoms. Are comfortable and fit you well. Are closed at the toe. Do not wear sandals. If you use a stepladder: Make sure that it is fully opened. Do not climb a closed stepladder. Make sure that both sides of the stepladder are locked into place. Ask someone to hold it for you, if possible. Clearly mark and make sure that you can see: Any grab bars or handrails. First and last steps. Where the edge of each step is. Use tools that help you move around (mobility aids) if they are needed. These include: Canes. Walkers. Scooters. Crutches. Turn on the lights when you go into a dark area. Replace any light bulbs  as soon as they burn out. Set up your furniture so you have a clear path. Avoid moving your furniture  around. If any of your floors are uneven, fix them. If there are any pets around you, be aware of where they are. Review your medicines with your doctor. Some medicines can make you feel dizzy. This can increase your chance of falling. Ask your doctor what other things that you can do to help prevent falls. This information is not intended to replace advice given to you by your health care provider. Make sure you discuss any questions you have with your health care provider. Document Released: 11/05/2008 Document Revised: 06/17/2015 Document Reviewed: 02/13/2014 Elsevier Interactive Patient Education  2017 [employer].

## 2022-06-06 NOTE — Progress Notes (Signed)
I connected with  Nicholas [name] on 06/06/22 by a audio enabled telemedicine application and verified that I am speaking with the correct person using two identifiers.  Patient Location: Home  Provider Location: Office/Clinic  I discussed the limitations of evaluation and management by telemedicine. The patient expressed understanding and agreed to proceed.  Subjective:   Nicholas [name] is a 77 y.o. male who presents for Medicare Annual/Subsequent preventive examination.  Review of Systems     Cardiac Risk Factors include: advanced age (>11men, >76 women);diabetes mellitus;hypertension;male gender;sedentary lifestyle;obesity (BMI >30kg/m2)     Objective:    Today's Vitals   06/06/22 0900  PainSc: 4    There is no height or weight on file to calculate BMI.     06/06/2022    9:07 AM 04/03/2022   10:54 AM 12/05/2021    1:07 PM 10/18/2021   10:39 AM 05/02/2021    8:37 AM 02/28/2021    1:07 PM 01/12/2021   10:04 AM  Advanced Directives  Does Patient Have a Medical Advance Directive? No No No No No Yes Yes  Type of Tax inspector;Living will  Would patient like information on creating a medical advance directive? No - Patient declined No - Patient declined No - Patient declined No - Patient declined No - Patient declined  No - Patient declined    Current Medications (verified) Outpatient Encounter Medications as of 06/06/2022  Medication Sig   acetaminophen (TYLENOL) 650 MG CR tablet Take 650 mg by mouth every 8 (eight) hours as needed for pain.   Ascorbic Acid (VITAMIN C) 1000 MG tablet Take 1,000 mg by mouth daily.   Blood Glucose Monitoring Suppl (ONETOUCH VERIO) w/Device KIT Use to check blood sugar 3 times a day and document results, bring to appointments.  Goal is <130 fasting blood sugar and <180 two hours after meals.   calcium-vitamin D (OSCAL WITH D) 500-5 MG-MCG tablet Take 1 tablet by mouth daily with breakfast.   Cranberry 500 MG  CAPS Take 500 mg by mouth daily.   ELIQUIS 5 MG TABS tablet TAKE 1 TABLET BY MOUTH TWICE A DAY   empagliflozin (JARDIANCE) 25 MG TABS tablet Take 1 tablet (25 mg total) by mouth daily.   gabapentin (NEURONTIN) 600 MG tablet TAKE 1 TABLET BY MOUTH EVERYDAY AT BEDTIME   glucose blood test strip Use to check blood sugar 3 times a day and document results, bring to appointments.  Goal is <130 fasting blood sugar and <180 two hours after meals.   HYDROcodone-acetaminophen (NORCO/VICODIN) 5-325 MG tablet Take 1 tablet by mouth every 6 (six) hours as needed for moderate pain or severe pain.   [START ON 06/21/2022] HYDROcodone-acetaminophen (NORCO/VICODIN) 5-325 MG tablet Take 1 tablet by mouth every 6 (six) hours as needed for moderate pain or severe pain.   Lancets (ONETOUCH ULTRASOFT) lancets Use to check blood sugar 3 times a day and document results, bring to appointments.  Goal is <130 fasting blood sugar and <180 two hours after meals.   LORazepam (ATIVAN) 1 MG tablet TAKE 1 TABLET BY MOUTH EVERYDAY AT BEDTIME   Magnesium 400 MG TABS Take 400 mg by mouth daily.    MELATONIN PO Take by mouth.   midodrine (PROAMATINE) 10 MG tablet Take 0.5 tablets (5 mg total) by mouth 3 (three) times daily.   Multiple Vitamin (MULTIVITAMIN WITH MINERALS) TABS tablet Take 1 tablet by mouth daily. Centrum Silver   Omega-3  Fatty Acids (FISH OIL) 1200 MG CAPS Take 1,200 mg by mouth daily.   oxymetazoline (AFRIN) 0.05 % nasal spray Place 2 sprays into both nostrils at bedtime.   pantoprazole (PROTONIX) 20 MG tablet Take 1 tablet (20 mg total) by mouth 2 (two) times daily before a meal.   potassium chloride SA (KLOR-CON M) 20 MEQ tablet Take 2 tablets (40 meq) with Torsemide   QUEtiapine Fumarate (SEROQUEL XR) 150 MG 24 hr tablet TAKE 1 TABLET BY MOUTH EVERYDAY AT BEDTIME   rOPINIRole (REQUIP) 0.25 MG tablet Take 0.25 mg by mouth 4 (four) times daily.   rosuvastatin (CRESTOR) 40 MG tablet TAKE 1 TABLET BY MOUTH EVERY DAY    solifenacin (VESICARE) 10 MG tablet TAKE 1 TABLET BY MOUTH EVERY DAY   tiZANidine (ZANAFLEX) 4 MG tablet Take 1 tablet (4 mg total) by mouth every 6 (six) hours as needed for muscle spasms.   torsemide (DEMADEX) 20 MG tablet Take 2 tablets (40 mg total) by mouth daily as needed (fluid retention).   venlafaxine XR (EFFEXOR-XR) 75 MG 24 hr capsule TAKE 3 CAPSULES BY MOUTH EVERY DAY   albuterol (VENTOLIN HFA) 108 (90 Base) MCG/ACT inhaler Inhale 2 puffs into the lungs every 6 (six) hours as needed. (Patient not taking: Reported on 06/06/2022)   No facility-administered encounter medications on file as of 06/06/2022.    Allergies (verified) Patient has no known allergies.   History: Past Medical History:  Diagnosis Date   Abdominal pain, chronic, right lower quadrant 12/13/2020   Anterior urethral stricture    Anxiety    Arthritis    a. knees, hips, hands;  b. 11/2013 s/p L TKA @ [facility].   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 Dr Graciela Husbands   Depression    DVT (deep venous thrombosis) (HCC)    Erosive esophagitis    Gross hematuria    Hyperlipemia    Hypertension    borderline   Internal hemorrhoids    Phimosis    Pulmonary embolism (HCC)    Past Surgical History:  Procedure Laterality Date   ATRIAL FIBRILLATION ABLATION N/A 05/17/2020   Procedure: ATRIAL FIBRILLATION ABLATION;  Surgeon: Lanier Prude, MD;  Location: [facility] INVASIVE CV LAB;  Service: Cardiovascular;  Laterality: N/A;   BUNIONECTOMY Bilateral 01/06/2015   Procedure: Arbutus Leas;  Surgeon: Deeann Saint, MD;  Location: [facility] ORS;  Service: Orthopedics;  Laterality: Bilateral;   CARDIAC CATHETERIZATION  ~ 2005   "once"   CATARACT EXTRACTION W/ INTRAOCULAR LENS  IMPLANT, BILATERAL Bilateral ~ 2010   COLONOSCOPY WITH PROPOFOL N/A 12/07/2015   Procedure: COLONOSCOPY WITH PROPOFOL;   Surgeon: Christena Deem, MD;  Location: [facility] ENDOSCOPY;  Service: Endoscopy;  Laterality: N/A;   COLONOSCOPY WITH PROPOFOL N/A 11/26/2020   Procedure: COLONOSCOPY WITH PROPOFOL;  Surgeon: Toney Reil, MD;  Location: [facility] ENDOSCOPY;  Service: Gastroenterology;  Laterality: N/A;   EP IMPLANTABLE DEVICE N/A 02/16/2016   MDT dual chamber (His Bundle) pacemaker implanted by Dr Graciela Husbands for intermittent complete heart block   ESOPHAGOGASTRODUODENOSCOPY (EGD) WITH PROPOFOL N/A 12/07/2015   Procedure: ESOPHAGOGASTRODUODENOSCOPY (EGD) WITH PROPOFOL;  Surgeon: Christena Deem, MD;  Location: [facility] ENDOSCOPY;  Service: Endoscopy;  Laterality: N/A;   ESOPHAGOGASTRODUODENOSCOPY (EGD) WITH PROPOFOL N/A 05/17/2017   Procedure: ESOPHAGOGASTRODUODENOSCOPY (EGD) WITH PROPOFOL;  Surgeon: Christena Deem, MD;  Location: [facility]  ENDOSCOPY;  Service: Endoscopy;  Laterality: N/A;   HAMMER TOE SURGERY Bilateral 01/06/2015   Procedure: HAMMER TOE CORRECTION;  Surgeon: Deeann Saint, MD;  Location: [facility] ORS;  Service: Orthopedics;  Laterality: Bilateral;   KNEE CARTILAGE SURGERY Left 1965   "football injury"   Left Total Knee Arthroplasty     a. 11/2013 [facility].   PACEMAKER IMPLANT  2017   PILONIDAL CYST EXCISION  1970's   TOTAL HIP ARTHROPLASTY Right 2004   TOTAL HIP ARTHROPLASTY Left 2006   UPPER GI ENDOSCOPY     Family History  Problem Relation Age of Onset   Alcoholism Mother        died in her 62's.   Cancer Mother    Stroke Father        deceased   Diabetes Father    Hypertension Father    Glaucoma Sister    Breast cancer Sister    Diabetes Daughter    Asthma Son    Stroke Paternal Grandmother    Prostate cancer Paternal Grandfather    Social History   Socioeconomic History   Marital status: Married    Spouse name: Not on file   Number of children: Not on file   Years of education: Not on file   Highest education level: High school graduate  Occupational History   Occupation: retired    Tobacco Use   Smoking status: Never   Smokeless tobacco: Never  Vaping Use   Vaping Use: Never used  Substance and Sexual Activity   Alcohol use: Not Currently   Drug use: No   Sexual activity: Not Currently  Other Topics Concern   Not on file  Social History Narrative   Lives in [place] with wife.  Does not routinely exercise.  Activity severely limited by bilateral knee pain.   Social Determinants of Health   Financial Resource Strain: Low Risk  (06/06/2022)   Overall Financial Resource Strain (CARDIA)    Difficulty of Paying Living Expenses: Not hard at all  Food Insecurity: No Food Insecurity (06/06/2022)   Hunger Vital Sign    Worried About Running Out of Food in the Last Year: Never true    Ran Out of Food in the Last Year: Never true  Transportation Needs: No Transportation Needs (06/06/2022)   PRAPARE - Administrator, Civil Service (Medical): No    Lack of Transportation (Non-Medical): No  Physical Activity: Inactive (06/06/2022)   Exercise Vital Sign    Days of Exercise per Week: 0 days    Minutes of Exercise per Session: 0 min  Stress: No Stress Concern Present (06/06/2022)   [employer] of Occupational Health - Occupational Stress Questionnaire    Feeling of Stress : Only a little  Social Connections: Moderately Integrated (06/06/2022)   Social Connection and Isolation Panel [NHANES]    Frequency of Communication with Friends and Family: More than three times a week    Frequency of Social Gatherings with Friends and Family: Once a week    Attends Religious Services: Never    Database administrator or Organizations: Yes    Attends Engineer, structural: More than 4 times per year    Marital Status: Married    Tobacco Counseling Counseling given: Not Answered   Clinical Intake:  Pre-visit preparation completed: Yes  Pain : 0-10 Pain Score: 4  Pain Type: Acute pain Pain Location: Hand Pain Orientation: Right Pain Radiating Towards:  fell yesterday     Nutritional Risks: None  Diabetes: Yes CBG done?: No Did pt. bring in CBG monitor from home?: No  How often do you need to have someone help you when you read instructions, pamphlets, or other written materials from your doctor or pharmacy?: 1 - Never  Diabetic?yes Nutrition Risk Assessment:  Has the patient had any N/V/D within the last 2 months?  No  Does the patient have any non-healing wounds?  No  Has the patient had any unintentional weight loss or weight gain?  No   Diabetes:  Is the patient diabetic?  Yes  If diabetic, was a CBG obtained today?  No  Did the patient bring in their glucometer from home?  No  How often do you monitor your CBG's? never.   Financial Strains and Diabetes Management:  Are you having any financial strains with the device, your supplies or your medication? No .  Does the patient want to be seen by Chronic Care Management for management of their diabetes?  No  Would the patient like to be referred to a Nutritionist or for Diabetic Management?  No   Diabetic Exams:  Diabetic Eye Exam: Completed 05/12/21. Overdue for diabetic eye exam. Pt has been advised about the importance in completing this exam.  Diabetic Foot Exam: Completed 05/30/22. Pt has been advised about the importance in completing this exam.   Interpreter Needed?: No  Information entered by :: Kennedy Bucker, LPN   Activities of Daily Living    06/06/2022    9:08 AM 12/07/2021    1:37 PM  In your present state of health, do you have any difficulty performing the following activities:  Hearing? 0 0  Vision? 0 0  Difficulty concentrating or making decisions? 0 0  Walking or climbing stairs? 1 0  Dressing or bathing? 0 0  Doing errands, shopping? 0 0  Preparing Food and eating ? N N  Using the Toilet? N N  In the past six months, have you accidently leaked urine? N N  Do you have problems with loss of bowel control? N N  Managing your Medications? N N   Managing your Finances? N N  Housekeeping or managing your Housekeeping? N N    Patient Care Team: Marjie Skiff, NP as PCP - General (Nurse Practitioner) Lanier Prude, MD as PCP - Electrophysiology (Cardiology) Duke Salvia, MD as PCP - Cardiology (Cardiology) Deeann Saint, MD (Specialist) Vanna Scotland, MD as Consulting Physician (Urology) Nyoka Cowden, MD as Consulting Physician (Pulmonary Disease) Yevette Edwards, MD as Consulting Physician (Anesthesiology) Duke Salvia, MD as Consulting Physician (Cardiology) Gustavus Bryant, LCSW as Social Worker (Licensed Clinical Social Worker) Zettie Pho, [facility] (Pharmacist) Marlowe Sax, RN as Case Manager (General Practice)  Indicate any recent Medical Services you may have received from other than [facility] providers in the past year (date may be approximate).     Assessment:   This is a routine wellness examination for [facility].  Hearing/Vision screen Hearing Screening - Comments:: No aids Vision Screening - Comments:: Wears glasses- [facility]  Dietary issues and exercise activities discussed: Current Exercise Habits: The patient does not participate in regular exercise at present   Goals Addressed             This Visit's Progress    DIET - EAT MORE FRUITS AND VEGETABLES         Depression Screen    06/06/2022    9:05 AM 05/30/2022    8:31 AM 04/26/2022  11:32 AM 03/01/2022    8:32 AM 12/05/2021    1:06 PM 11/29/2021    8:53 AM 11/29/2021    8:24 AM  PHQ 2/9 Scores  PHQ - 2 Score 2  4  0 0   PHQ- 9 Score 4  13   0   Exception Documentation  Patient refusal  Patient refusal   Patient refusal    Fall Risk    06/06/2022    9:08 AM 05/30/2022    8:30 AM 04/26/2022   11:18 AM 03/01/2022    8:32 AM 12/07/2021    1:37 PM  Fall Risk   Falls in the past year? 1 0 0 0 1  Number falls in past yr: 0 0 0 0 1  Injury with Fall? 1 0 0 0 0  Risk for fall due to : History of fall(s) No Fall Risks No Fall Risks  No Fall Risks History of fall(s);Impaired balance/gait  Follow up Falls prevention discussed;Falls evaluation completed Falls evaluation completed Falls evaluation completed  Falls evaluation completed;Education provided;Falls prevention discussed    FALL RISK PREVENTION PERTAINING TO THE HOME:  Any stairs in or around the home? Yes  If so, are there any without handrails? No  Home free of loose throw rugs in walkways, pet beds, electrical cords, etc? Yes  Adequate lighting in your home to reduce risk of falls? Yes   ASSISTIVE DEVICES UTILIZED TO PREVENT FALLS:  Life alert? No  Use of a cane, walker or w/c? Yes - cane if out Grab bars in the bathroom? Yes  Shower chair or bench in shower? Yes  Elevated toilet seat or a handicapped toilet? Yes    Cognitive Function:        06/06/2022    9:13 AM 05/02/2021    8:36 AM 04/30/2020    8:27 AM 11/10/2019    8:09 AM 04/18/2018    8:27 AM  6CIT Screen  What Year? 0 points 0 points 0 points 0 points 0 points  What month? 0 points 0 points 0 points 0 points 0 points  What time? 0 points 0 points 0 points 0 points 0 points  Count back from 20 2 points 0 points 0 points 0 points 0 points  Months in reverse 2 points 0 points 0 points 0 points 0 points  Repeat phrase 0 points 0 points 2 points 0 points 0 points  Total Score 4 points 0 points 2 points 0 points 0 points    Immunizations Immunization History  Administered Date(s) Administered   COVID-19, mRNA, vaccine(Comirnaty)12 years and older 10/20/2021   Fluad Quad(high Dose 65+) 10/22/2020   Influenza Split 11/13/2013   Influenza Whole 11/09/2015   Influenza, High Dose Seasonal PF 11/16/2015, 11/14/2016, 09/27/2017, 09/28/2018, 10/14/2019, 10/20/2021   Influenza,inj,Quad PF,6+ Mos 10/20/2014   Influenza-Unspecified 09/23/2012, 03/21/2018   Moderna Sars-Covid-2 Vaccination 03/07/2019, 04/07/2019, 12/01/2019   Pneumococcal Conjugate-13 04/28/2014, 09/28/2018   Pneumococcal  Polysaccharide-23 04/10/2013, 10/14/2019   Pneumococcal-Unspecified 10/01/2008   Td 01/05/2005   Td (Adult), 2 Lf Tetanus Toxid, Preservative Free 01/05/2005   Tdap 06/11/2017   Zoster Recombinat (Shingrix) 09/09/2021, 01/25/2022    TDAP status: Up to date  Flu Vaccine status: Up to date  Pneumococcal vaccine status: Up to date  Covid-19 vaccine status: Completed vaccines  Qualifies for Shingles Vaccine? Yes   Zostavax completed No   Shingrix Completed?: Yes  Screening Tests Health Maintenance  Topic Date Due   COVID-19 Vaccine (5 - 2023-24 season) 12/15/2021  OPHTHALMOLOGY EXAM  05/13/2022   INFLUENZA VACCINE  08/24/2022   HEMOGLOBIN A1C  11/30/2022   Diabetic kidney evaluation - Urine ACR  03/02/2023   Diabetic kidney evaluation - eGFR measurement  05/30/2023   FOOT EXAM  05/30/2023   Medicare Annual Wellness (AWV)  06/06/2023   DTaP/Tdap/Td (3 - Td or Tdap) 06/12/2027   Pneumonia Vaccine 64+ Years old  Completed   Hepatitis C Screening  Completed   Zoster Vaccines- Shingrix  Completed   HPV VACCINES  Aged Out   COLONOSCOPY (Pts 45-62yrs Insurance coverage will need to be confirmed)  Discontinued    Health Maintenance  Health Maintenance Due  Topic Date Due   COVID-19 Vaccine (5 - 2023-24 season) 12/15/2021   OPHTHALMOLOGY EXAM  05/13/2022    Colorectal cancer screening: No longer required.   Lung Cancer Screening: (Low Dose CT Chest recommended if Age 76-80 years, 30 pack-year currently smoking OR have quit w/in 15years.) does not qualify.   Additional Screening:  Hepatitis C Screening: does qualify; Completed 05/11/15  Vision Screening: Recommended annual ophthalmology exams for early detection of glaucoma and other disorders of the eye. Is the patient up to date with their annual eye exam?  Yes  Who is the provider or what is the name of the office in which the patient attends annual eye exams? [facility] If pt is not established with a provider, would  they like to be referred to a provider to establish care? No .   Dental Screening: Recommended annual dental exams for proper oral hygiene  Community Resource Referral / Chronic Care Management: CRR required this visit?  No   CCM required this visit?  No      Plan:     I have personally reviewed and noted the following in the patient's chart:   Medical and social history Use of alcohol, tobacco or illicit drugs  Current medications and supplements including opioid prescriptions. Patient is currently taking opioid prescriptions. Information provided to patient regarding non-opioid alternatives. Patient advised to discuss non-opioid treatment plan with their provider. Functional ability and status Nutritional status Physical activity Advanced directives List of other physicians Hospitalizations, surgeries, and ER visits in previous 12 months Vitals Screenings to include cognitive, depression, and falls Referrals and appointments  In addition, I have reviewed and discussed with patient certain preventive protocols, quality metrics, and best practice recommendations. A written personalized care plan for preventive services as well as general preventive health recommendations were provided to patient.     Hal Hope, LPN   1/61/0960   Nurse Notes: none

## 2022-06-12 ENCOUNTER — Ambulatory Visit (INDEPENDENT_AMBULATORY_CARE_PROVIDER_SITE_OTHER): Payer: Medicare Other

## 2022-06-12 DIAGNOSIS — I442 Atrioventricular block, complete: Secondary | ICD-10-CM | POA: Diagnosis not present

## 2022-06-12 LAB — CUP PACEART REMOTE DEVICE CHECK
Battery Remaining Longevity: 20 mo
Battery Voltage: 2.92 V
Brady Statistic AP VP Percent: 30.2 %
Brady Statistic AP VS Percent: 0 %
Brady Statistic AS VP Percent: 69.79 %
Brady Statistic AS VS Percent: 0.01 %
Brady Statistic RA Percent Paced: 30.14 %
Brady Statistic RV Percent Paced: 99.98 %
Date Time Interrogation Session: 20240520115015
Implantable Lead Connection Status: 753985
Implantable Lead Connection Status: 753985
Implantable Lead Implant Date: 20180124
Implantable Lead Implant Date: 20180124
Implantable Lead Location: 753859
Implantable Lead Location: 753860
Implantable Lead Model: 3830
Implantable Lead Model: 5076
Implantable Pulse Generator Implant Date: 20180124
Lead Channel Impedance Value: 361 Ohm
Lead Channel Impedance Value: 361 Ohm
Lead Channel Impedance Value: 380 Ohm
Lead Channel Impedance Value: 437 Ohm
Lead Channel Pacing Threshold Amplitude: 0.5 V
Lead Channel Pacing Threshold Amplitude: 0.75 V
Lead Channel Pacing Threshold Pulse Width: 0.4 ms
Lead Channel Pacing Threshold Pulse Width: 0.4 ms
Lead Channel Sensing Intrinsic Amplitude: 0.75 mV
Lead Channel Sensing Intrinsic Amplitude: 0.75 mV
Lead Channel Sensing Intrinsic Amplitude: 3.625 mV
Lead Channel Sensing Intrinsic Amplitude: 3.625 mV
Lead Channel Setting Pacing Amplitude: 2 V
Lead Channel Setting Pacing Amplitude: 2.25 V
Lead Channel Setting Pacing Pulse Width: 1 ms
Lead Channel Setting Sensing Sensitivity: 0.6 mV
Zone Setting Status: 755011
Zone Setting Status: 755011

## 2022-06-13 ENCOUNTER — Other Ambulatory Visit: Payer: Self-pay | Admitting: Nurse Practitioner

## 2022-06-13 DIAGNOSIS — F339 Major depressive disorder, recurrent, unspecified: Secondary | ICD-10-CM

## 2022-06-13 NOTE — Progress Notes (Signed)
  Chronic Care Management Note  06/13/2022 Name: [name] MRN: [id number] DOB: [birth year]  Nicholas [name] is a 77 y.o. year old male who is a primary care patient of Marjie Skiff, NP and is actively engaged with the Chronic Care Management team. I reached out to Nicholas [name] by phone today to assist with re-scheduling a follow up visit with the RN Case Manager  Follow up plan: Telephone appointment with care management team member scheduled for:06/29/2022  Penne Lash, RMA Care Guide [facility]  [place], [place] [address] Direct Dial: [phone] [email or web address]

## 2022-06-14 NOTE — Telephone Encounter (Signed)
Requested by interface sureascripts.  Last labs 05/30/22.  Future visit in 2 months.  Requested Prescriptions  Pending Prescriptions Disp Refills   venlafaxine XR (EFFEXOR-XR) 75 MG 24 hr capsule [Pharmacy Med Name: VENLAFAXINE HCL ER 75 MG CAP] 270 capsule 0    Sig: TAKE 3 CAPSULES BY MOUTH EVERY DAY     Psychiatry: Antidepressants - SNRI - desvenlafaxine & venlafaxine Failed - 06/13/2022  7:08 PM      Failed - Lipid Panel in normal range within the last 12 months    Cholesterol, Total  Date Value Ref Range Status  05/30/2022 133 100 - 199 mg/dL Final   Cholesterol Piccolo, Waived  Date Value Ref Range Status  01/03/2018 198 <200 mg/dL Final    Comment:                            Desirable                <200                         Borderline High      200- 239                         High                     >239    LDL Chol Calc (NIH)  Date Value Ref Range Status  05/30/2022 59 0 - 99 mg/dL Final   HDL  Date Value Ref Range Status  05/30/2022 49 >39 mg/dL Final   Triglycerides  Date Value Ref Range Status  05/30/2022 143 0 - 149 mg/dL Final   Triglycerides Piccolo,Waived  Date Value Ref Range Status  01/03/2018 264 (H) <150 mg/dL Final    Comment:                            Normal                   <150                         Borderline High     150 - 199                         High                200 - 499                         Very High                >499          Passed - Cr in normal range and within 360 days    Creatinine  Date Value Ref Range Status  02/26/2014 0.94 0.60 - 1.30 mg/dL Final   Creatinine, Ser  Date Value Ref Range Status  05/30/2022 1.11 0.76 - 1.27 mg/dL Final         Passed - Completed PHQ-2 or PHQ-9 in the last 360 days      Passed - Last BP in normal range    BP Readings from Last 1 Encounters:  05/30/22 128/80         Passed -  Valid encounter within last 6 months    Recent Outpatient Visits           2 weeks ago Type  2 diabetes mellitus with obesity (HCC)   [place] [facility] [place], [place] T, NP   1 month ago SOB (shortness of breath)   [place] [facility] [place], [name], NP   1 month ago Subacute cough   [place] [facility] [place], [place] T, NP   1 month ago SOB (shortness of breath)   [place] [facility] [place], Corrie Dandy T, NP   2 months ago SOB (shortness of breath)   [facility] [place] Dorcas Carrow, DO       Future Appointments             In 2 months [place], [name], NP  [facility]   In 9 months Vanna Scotland, MD [facility]

## 2022-06-29 ENCOUNTER — Ambulatory Visit (INDEPENDENT_AMBULATORY_CARE_PROVIDER_SITE_OTHER): Payer: Medicare Other

## 2022-06-29 ENCOUNTER — Telehealth: Payer: Medicare Other

## 2022-06-29 DIAGNOSIS — E1159 Type 2 diabetes mellitus with other circulatory complications: Secondary | ICD-10-CM

## 2022-06-29 DIAGNOSIS — I480 Paroxysmal atrial fibrillation: Secondary | ICD-10-CM

## 2022-06-29 DIAGNOSIS — E119 Type 2 diabetes mellitus without complications: Secondary | ICD-10-CM

## 2022-06-29 DIAGNOSIS — E1169 Type 2 diabetes mellitus with other specified complication: Secondary | ICD-10-CM

## 2022-06-29 NOTE — Patient Instructions (Signed)
Please call the care guide team at [phone] if you need to cancel or reschedule your appointment.   If you are experiencing a Mental Health or Behavioral Health Crisis or need someone to talk to, please call the Suicide and Crisis Lifeline: 988 call the [place] National Suicide Prevention Lifeline: 5198878135 or TTY: 646 636 7532 TTY [phone]) to talk to a trained counselor call 1-800-273-TALK (toll free, 24 hour hotline)   Following is a copy of the CCM Program Consent:  CCM service includes personalized support from designated clinical staff supervised by the physician, including individualized plan of care and coordination with other care providers 24/7 contact phone numbers for assistance for urgent and routine care needs. Service will only be billed when office clinical staff spend 20 minutes or more in a month to coordinate care. Only one practitioner may furnish and bill the service in a calendar month. The patient may stop CCM services at amy time (effective at the end of the month) by phone call to the office staff. The patient will be responsible for cost sharing (co-pay) or up to 20% of the service fee (after annual deductible is met)  Following is a copy of your full provider care plan:   Goals Addressed             This Visit's Progress    CCM (HTN) EXPECTED OUTCOME: MONITOR, SELF-MANAGE AND REDUCE SYMPTOMS OF HTN       Current Barriers:  Knowledge Deficits related to benefits of taking blood pressures on a regular basis and normal values  Chronic Disease Management support and education needs related to effective management of HTN   BP Readings from Last 3 Encounters:  05/30/22 128/80  05/12/22 (!) 139/90  05/04/22 138/83     Planned Interventions: Evaluation of current treatment plan related to hypertension self management and patient's adherence to plan as established by provider. The patient denies any issues with HTN or heart health. States he is having  shortness of breath and no energy. Saw pulmonologist and has been referred to [facility] for follow up.;   Provided education to patient re: stroke prevention, s/s of heart attack and stroke; Reviewed prescribed diet heart healthy/ADA diet. The patient is compliant with a heart healthy/ADA diet. Denies any needs related to dietary restrictions Reviewed medications with patient and discussed importance of compliance. Patient states compliance with medications. Works with pharm D for ongoing support and education.    Discussed plans with patient for ongoing care management follow up and provided patient with direct contact information for care management team; Advised patient, providing education and rationale, to monitor blood pressure daily and record, calling PCP for findings outside established parameters. The patient states that his blood pressures are higher now. He has not had to take the medications to raise his blood pressures. Education on systolic goal of <951 and diastolic of <90. Denies any issues with hypotension at this time. Has had to take medications in the past Reviewed scheduled/upcoming provider appointments including: 08-30-2022 at 08 am with the pcp- reminder given today Advised patient to discuss changes in blood pressures or heart health with provider; Provided education on prescribed diet heart healthy/ADA diet;  Discussed complications of poorly controlled blood pressure such as heart disease, stroke, circulatory complications, vision complications, kidney impairment, sexual dysfunction;  Screening for signs and symptoms of depression related to chronic disease state;  Assessed social determinant of health barriers;   Symptom Management: Take medications as prescribed   Attend all scheduled provider appointments Perform  all self care activities independently  Call provider office for new concerns or questions  call the Suicide and Crisis Lifeline: 988 call the [place] National Suicide  Prevention Lifeline: 934-314-9577 or TTY: 204-732-6233 TTY 2145509003) to talk to a trained counselor call 1-800-273-TALK (toll free, 24 hour hotline) if experiencing a Mental Health or Behavioral Health Crisis  check blood pressure weekly learn about high blood pressure keep a blood pressure log take blood pressure log to all doctor appointments call doctor for signs and symptoms of high blood pressure develop an action plan for high blood pressure keep all doctor appointments take medications for blood pressure exactly as prescribed report new symptoms to your doctor  Follow Up Plan: Telephone follow up appointment with care management team member scheduled for: 09-14-2022 at 1030 am        CCM Expected Outcome:  Monitor, Self-Manage and Reduce Symptoms of Afib       Current Barriers:  Knowledge Deficits related to how to tell if his heart rate is out of rhythm  with condition of AFIB Chronic Disease Management support and education needs related to effective management of AFIB  Planned Interventions: Provider order and care plan reviewed. Collaborated with PharmD regarding patient care and plan. Takes Eliquis 5mg  BID. Works with pharm D for ongoing support and education Counseled on increased risk of stroke due to Afib and benefits of anticoagulation for stroke prevention           Reviewed importance of adherence to anticoagulant exactly as prescribed. The patient is compliant with medications. Advised patient to discuss changes in heart rate, changes in activity level, acute onset of shortness of breath, or other questions and concerns related to AFIB with provider Counseled on bleeding risk associated with AFIB and importance of self-monitoring for signs/symptoms of bleeding Counseled on avoidance of NSAIDs due to increased bleeding risk with anticoagulants Counseled on importance of regular laboratory monitoring as prescribed. The patient has lab work as requested by  providers.  Counseled on seeking medical attention after a head injury or if there is blood in the urine/stool Afib action plan reviewed. The patient had an ablation 05-17-2020. Denies any acute changes in his AFIB or heart health. Saw cardiologist and does not have to see cardiologist again for a year unless needed before then. The patient is having shortness of breath and no energy level. Saw the pulmonary provider at [facility] recently. The pulmonary provider thinks he has a blood clot that could be the cause of his shortness of breath. He has been referred to [name] in [place]. The patient has talked with them briefly over the phone and they told him he would hear back in one week. He states it has been over a week and he has not heard back. Provided the patient with the direct number to [name] office at [phone]. The patient wants to get in to see the provider to see what they can do to help with his sx and sx.  Screening for signs and symptoms of depression related to chronic disease state Assessed social determinant of health barriers  Symptom Management: Take medications as prescribed   Attend all scheduled provider appointments Call provider office for new concerns or questions  call the Suicide and Crisis Lifeline: 988 call the [place] National Suicide Prevention Lifeline: 7255415990 or TTY: 306-729-9617 TTY (626)234-4183) to talk to a trained counselor call 1-800-273-TALK (toll free, 24 hour hotline) if experiencing a Mental Health or Behavioral Health Crisis  -  make a plan to eat healthy - keep all lab appointments - take medicine as prescribed  Follow Up Plan: Telephone follow up appointment with care management team member scheduled for: 09-14-2022 at 1030 am       CCM Expected Outcome:  Monitor, Self-Manage and Reduce Symptoms of Diabetes       Current Barriers:  Knowledge Deficits related to monitoring for changes in blood sugars and the effect of elevated  blood sugars on other body systems Chronic Disease Management support and education needs related to effective management of DM Lab Results  Component Value Date   HGBA1C 7.0 (H) 05/30/2022     Planned Interventions: Provided education to patient about basic DM disease process. The patient with slightly elevated A1C level. The patient is mindful of changes. He feels overall he is doing well except for the shortness of breath and low energy level. He is being proactive with appointments and follow up with specialist. Education and support given. ; Reviewed medications with patient and discussed importance of medication adherence. The patient is compliant with medications. Did not tolerate Ozempic. Is currently controlling DM with diet.         Reviewed prescribed diet with patient heart healthy/ADA diet. Patient is mindful of dietary restrictions. The patient denies any issues with dietary restrictions ; Counseled on importance of regular laboratory monitoring as prescribed. Has regular lab testing;        Discussed plans with patient for ongoing care management follow up and provided patient with direct contact information for care management team;      Provided patient with written educational materials related to hypo and hyperglycemia and importance of correct treatment;       Reviewed scheduled/upcoming provider appointments including: 08-30-2022 at 800 am. Reminder given today         Advised patient, providing education and rationale, to check cbg when you have symptoms of low or high blood sugar and as directed   and record        Referral made to pharmacy team for assistance with medication reconciliation, education and support. Works with pharm D on a consistent basis;       Review of patient status, including review of consultants reports, relevant laboratory and other test results, and medications completed;       Advised patient to discuss changes in DM health and well being with  provider;      Screening for signs and symptoms of depression related to chronic disease state;        Assessed social determinant of health barriers;       Eye exam completed today (02-09-2022). Denies any concerns with vision and states he received a good report.    Symptom Management: Take medications as prescribed   Attend all scheduled provider appointments Call provider office for new concerns or questions  call the Suicide and Crisis Lifeline: 988 call the [place] National Suicide Prevention Lifeline: 469-443-2232 or TTY: (484)814-9837 TTY 215-548-2530) to talk to a trained counselor call 1-800-273-TALK (toll free, 24 hour hotline) if experiencing a Mental Health or Behavioral Health Crisis  check feet daily for cuts, sores or redness trim toenails straight across manage portion size wash and dry feet carefully every day wear comfortable, cotton socks wear comfortable, well-fitting shoes  Follow Up Plan: Telephone follow up appointment with care management team member scheduled for: 09-14-2022 at 1030 am          Patient verbalizes understanding of instructions and care plan provided  today and agrees to view in MyChart. Active MyChart status and patient understanding of how to access instructions and care plan via MyChart confirmed with patient.  Telephone follow up appointment with care management team member scheduled for: 09-14-2022 at 1030 am

## 2022-06-29 NOTE — Chronic Care Management (AMB) (Signed)
Chronic Care Management   CCM RN Visit Note  06/29/2022 Name: Nicholas [name] MRN: [id number] DOB: Jan 19, 1946  Subjective: Nicholas [name] is a 77 y.o. year old male who is a primary care patient of Cannady, [name], NP. The patient was referred to the Chronic Care Management team for assistance with care management needs subsequent to provider initiation of CCM services and plan of care.    Today's Visit:  Engaged with patient by telephone for follow up visit.        Goals Addressed             This Visit's Progress    CCM (HTN) EXPECTED OUTCOME: MONITOR, SELF-MANAGE AND REDUCE SYMPTOMS OF HTN       Current Barriers:  Knowledge Deficits related to benefits of taking blood pressures on a regular basis and normal values  Chronic Disease Management support and education needs related to effective management of HTN   BP Readings from Last 3 Encounters:  05/30/22 128/80  05/12/22 (!) 139/90  05/04/22 138/83     Planned Interventions: Evaluation of current treatment plan related to hypertension self management and patient's adherence to plan as established by provider. The patient denies any issues with HTN or heart health. States he is having shortness of breath and no energy. Saw pulmonologist and has been referred to [facility] for follow up.;   Provided education to patient re: stroke prevention, s/s of heart attack and stroke; Reviewed prescribed diet heart healthy/ADA diet. The patient is compliant with a heart healthy/ADA diet. Denies any needs related to dietary restrictions Reviewed medications with patient and discussed importance of compliance. Patient states compliance with medications. Works with pharm D for ongoing support and education.    Discussed plans with patient for ongoing care management follow up and provided patient with direct contact information for care management team; Advised patient, providing education and rationale, to monitor blood pressure daily and record,  calling PCP for findings outside established parameters. The patient states that his blood pressures are higher now. He has not had to take the medications to raise his blood pressures. Education on systolic goal of <308 and diastolic of <90. Denies any issues with hypotension at this time. Has had to take medications in the past Reviewed scheduled/upcoming provider appointments including: 08-30-2022 at 08 am with the pcp- reminder given today Advised patient to discuss changes in blood pressures or heart health with provider; Provided education on prescribed diet heart healthy/ADA diet;  Discussed complications of poorly controlled blood pressure such as heart disease, stroke, circulatory complications, vision complications, kidney impairment, sexual dysfunction;  Screening for signs and symptoms of depression related to chronic disease state;  Assessed social determinant of health barriers;   Symptom Management: Take medications as prescribed   Attend all scheduled provider appointments Perform all self care activities independently  Call provider office for new concerns or questions  call the Suicide and Crisis Lifeline: 988 call the [place] National Suicide Prevention Lifeline: (337)700-6866 or TTY: 414-363-6400 TTY [phone]) to talk to a trained counselor call 1-800-273-TALK (toll free, 24 hour hotline) if experiencing a Mental Health or Behavioral Health Crisis  check blood pressure weekly learn about high blood pressure keep a blood pressure log take blood pressure log to all doctor appointments call doctor for signs and symptoms of high blood pressure develop an action plan for high blood pressure keep all doctor appointments take medications for blood pressure exactly as prescribed report new symptoms to your doctor  Follow  Up Plan: Telephone follow up appointment with care management team member scheduled for: 09-14-2022 at 1030 am        CCM Expected Outcome:  Monitor,  Self-Manage and Reduce Symptoms of Afib       Current Barriers:  Knowledge Deficits related to how to tell if his heart rate is out of rhythm  with condition of AFIB Chronic Disease Management support and education needs related to effective management of AFIB  Planned Interventions: Provider order and care plan reviewed. Collaborated with PharmD regarding patient care and plan. Takes Eliquis 5mg  BID. Works with pharm D for ongoing support and education Counseled on increased risk of stroke due to Afib and benefits of anticoagulation for stroke prevention           Reviewed importance of adherence to anticoagulant exactly as prescribed. The patient is compliant with medications. Advised patient to discuss changes in heart rate, changes in activity level, acute onset of shortness of breath, or other questions and concerns related to AFIB with provider Counseled on bleeding risk associated with AFIB and importance of self-monitoring for signs/symptoms of bleeding Counseled on avoidance of NSAIDs due to increased bleeding risk with anticoagulants Counseled on importance of regular laboratory monitoring as prescribed. The patient has lab work as requested by providers.  Counseled on seeking medical attention after a head injury or if there is blood in the urine/stool Afib action plan reviewed. The patient had an ablation 05-17-2020. Denies any acute changes in his AFIB or heart health. Saw cardiologist and does not have to see cardiologist again for a year unless needed before then. The patient is having shortness of breath and no energy level. Saw the pulmonary provider at [facility] recently. The pulmonary provider thinks he has a blood clot that could be the cause of his shortness of breath. He has been referred to [name] in [place]. The patient has talked with them briefly over the phone and they told him he would hear back in one week. He states it has been over a week and he has not heard  back. Provided the patient with the direct number to [name] office at [phone]. The patient wants to get in to see the provider to see what they can do to help with his sx and sx.  Screening for signs and symptoms of depression related to chronic disease state Assessed social determinant of health barriers  Symptom Management: Take medications as prescribed   Attend all scheduled provider appointments Call provider office for new concerns or questions  call the Suicide and Crisis Lifeline: 988 call the [place] National Suicide Prevention Lifeline: 939-790-9618 or TTY: 248-562-6376 TTY (605)705-4723) to talk to a trained counselor call 1-800-273-TALK (toll free, 24 hour hotline) if experiencing a Mental Health or Behavioral Health Crisis  - make a plan to eat healthy - keep all lab appointments - take medicine as prescribed  Follow Up Plan: Telephone follow up appointment with care management team member scheduled for: 09-14-2022 at 1030 am       CCM Expected Outcome:  Monitor, Self-Manage and Reduce Symptoms of Diabetes       Current Barriers:  Knowledge Deficits related to monitoring for changes in blood sugars and the effect of elevated blood sugars on other body systems Chronic Disease Management support and education needs related to effective management of DM Lab Results  Component Value Date   HGBA1C 7.0 (H) 05/30/2022     Planned Interventions: Provided education to patient  about basic DM disease process. The patient with slightly elevated A1C level. The patient is mindful of changes. He feels overall he is doing well except for the shortness of breath and low energy level. He is being proactive with appointments and follow up with specialist. Education and support given. ; Reviewed medications with patient and discussed importance of medication adherence. The patient is compliant with medications. Did not tolerate Ozempic. Is currently controlling DM with diet.          Reviewed prescribed diet with patient heart healthy/ADA diet. Patient is mindful of dietary restrictions. The patient denies any issues with dietary restrictions ; Counseled on importance of regular laboratory monitoring as prescribed. Has regular lab testing;        Discussed plans with patient for ongoing care management follow up and provided patient with direct contact information for care management team;      Provided patient with written educational materials related to hypo and hyperglycemia and importance of correct treatment;       Reviewed scheduled/upcoming provider appointments including: 08-30-2022 at 800 am. Reminder given today         Advised patient, providing education and rationale, to check cbg when you have symptoms of low or high blood sugar and as directed   and record        Referral made to pharmacy team for assistance with medication reconciliation, education and support. Works with pharm D on a consistent basis;       Review of patient status, including review of consultants reports, relevant laboratory and other test results, and medications completed;       Advised patient to discuss changes in DM health and well being with provider;      Screening for signs and symptoms of depression related to chronic disease state;        Assessed social determinant of health barriers;       Eye exam completed today (02-09-2022). Denies any concerns with vision and states he received a good report.    Symptom Management: Take medications as prescribed   Attend all scheduled provider appointments Call provider office for new concerns or questions  call the Suicide and Crisis Lifeline: 988 call the [place] National Suicide Prevention Lifeline: 518-162-9791 or TTY: 9397110518 TTY 281-117-7579) to talk to a trained counselor call 1-800-273-TALK (toll free, 24 hour hotline) if experiencing a Mental Health or Behavioral Health Crisis  check feet daily for cuts, sores or redness trim  toenails straight across manage portion size wash and dry feet carefully every day wear comfortable, cotton socks wear comfortable, well-fitting shoes  Follow Up Plan: Telephone follow up appointment with care management team member scheduled for: 09-14-2022 at 1030 am          Plan:Telephone follow up appointment with care management team member scheduled for:  09-14-2022 at 1030 am  Alto Denver RN, MSN, CCM RN Care Manager  Chronic Care Management Direct Number: [phone]

## 2022-06-30 ENCOUNTER — Other Ambulatory Visit: Payer: Self-pay | Admitting: Internal Medicine

## 2022-06-30 NOTE — Telephone Encounter (Signed)
Please advise If ok to refill Torsemide last filled by PCP.

## 2022-07-03 ENCOUNTER — Ambulatory Visit: Payer: Self-pay

## 2022-07-03 NOTE — Telephone Encounter (Signed)
  Chief Complaint: Rash between legs Symptoms: itch Frequency: Did not ask Pertinent Negatives: Patient denies  Disposition: [] ED /[] Urgent Care (no appt availability in office) / [] Appointment(In office/virtual)/ []  [facility]/ [] Home Care/ [] Refused Recommended Disposition /[] [facility]/ [x]  Follow-up with PCP Additional Notes: Pt states that he has a itchy rash between his legs. He has tried hydrocortisone cram, sera v healing cream and a powder for chafing without relief. Pt does not want to see any other provider than [facility]. Pt is hoping that [facility] will call in a medication. PT has not tried any antifungal, but will go to the pharmacy to get an antifungal.   Per protocol - pt should be seen within 3 days. First available was scheduled - 07/17/2022. Please advise.  Summary: rash   rash in between legs for a week and 1/2, cream and powder is not working, patient declined to see another provider at this time and would like to see if PCP would call in something first.     Reason for Disposition  Male  Answer Assessment - Initial Assessment Questions 1. APPEARANCE of RASH: "Describe the rash."      Itchy rash 2. LOCATION: "Where is the rash located?"      Between legs 6. ITCHING: "Does the rash itch?" If Yes, ask: "How bad is the itch?"  (Scale 0-10; or none, mild, moderate, severe)     yes  Answer Assessment - Initial Assessment Questions 2. LOCATION: "Where is the rash located?"      Between legs  4. ITCHING: "Does the rash itch?" If Yes, ask: "How bad is the itch?"  (Scale 1-10; or mild, moderate, severe)     yes  Protocols used: Rash or Redness - Localized-A-AH, Jock Itch-A-AH

## 2022-07-03 NOTE — Telephone Encounter (Signed)
Needs appt please.  

## 2022-07-03 NOTE — Telephone Encounter (Signed)
Appointment has been made

## 2022-07-11 NOTE — Progress Notes (Signed)
Remote pacemaker transmission.   

## 2022-07-16 NOTE — Patient Instructions (Signed)
Be Involved in Your Health Care:  Taking Medications When medications are taken as directed, they can greatly improve your health. But if they are not taken as instructed, they may not work. In some cases, not taking them correctly can be harmful. To help ensure your treatment remains effective and safe, understand your medications and how to take them.  Your lab results, notes and after visit summary will be available on My Chart. We strongly encourage you to use this feature. If lab results are abnormal the clinic will contact you with the appropriate steps. If the clinic does not contact you assume the results are satisfactory. You can always see your results on My Chart. If you have questions regarding your condition, please contact the clinic during office hours. You can also ask questions on My Chart.  We at [facility] are grateful that you chose [place] to provide care. We strive to provide excellent and compassionate care and are always looking for feedback. If you get a survey from the clinic please complete this.    Rash, Adult  A rash is a breakout of spots or blotches on the skin. It can change the way your skin looks and feels. Many things can cause a rash. The goal of treatment is to stop the itching and keep the rash from spreading. Follow these instructions at home: Medicine Take or apply over-the-counter and prescription medicines only as told by your doctor. These may include medicines to treat: Red or swollen skin. Itching. An allergy. Pain. An infection.  Skin care Put a cool, wet cloth on the rash. Do not scratch or rub your skin. Try not to cover the rash. Keep it exposed to air as often as you can. Managing itching and discomfort Avoid hot showers or baths. These can make itching worse. A cold shower may help. Try taking a bath with: Epsom salts. You can get these at your pharmacy or grocery store. Follow the instructions on the package. Baking soda.  Pour a small amount into the bath as told by your doctor. Colloidal oatmeal. You can get this at your pharmacy or grocery store. Follow the instructions on the package. Try putting baking soda paste on your skin. Stir water into baking soda until it gets like a paste. Try putting on a lotion to help with itching (calamine lotion). Keep cool. Stay out of the sun. Sweating and being hot can make itching worse. General instructions  Rest as needed. Drink enough fluid to keep your pee (urine) pale yellow. Wear loose-fitting clothes. Avoid scented soaps, detergents, and perfumes. Use gentle soaps, detergents, perfumes, and cosmetics. Avoid the things that cause your rash. Keep a journal to help keep track of what causes your rash. Write down: What you eat. What cosmetics you use. What you drink. What you wear. This includes jewelry. Contact a doctor if: You sweat a lot at night. You pee (urinate) more or less than normal. Your pee is a darker color than normal. Your eyes are sensitive to light. Your skin or the white parts of your eyes turn yellow. Your skin tingles or is numb. You get painful blisters in your nose or mouth. Your rash does not go away after a few days, or it gets worse. You are more tired than normal. You are more thirsty than normal. You have new or worse symptoms. These may include: Pain in your belly. A fever. Watery poop (diarrhea). Vomiting. Weakness. Weight loss. Get help right away if: You start  to feel mixed up (confused). You have a very bad headache or a stiff neck. You have very bad joint pain or stiffness. You get very sleepy or not responsive. You have a seizure. This information is not intended to replace advice given to you by your health care provider. Make sure you discuss any questions you have with your health care provider. Document Revised: 10/28/2021 Document Reviewed: 10/28/2021 Elsevier Patient Education  2024 [employer].

## 2022-07-17 ENCOUNTER — Encounter: Payer: Self-pay | Admitting: Nurse Practitioner

## 2022-07-17 ENCOUNTER — Ambulatory Visit (INDEPENDENT_AMBULATORY_CARE_PROVIDER_SITE_OTHER): Payer: Medicare Other | Admitting: Nurse Practitioner

## 2022-07-17 VITALS — BP 127/83 | HR 86 | Temp 98.0°F | Ht 70.0 in | Wt 281.2 lb

## 2022-07-17 DIAGNOSIS — B356 Tinea cruris: Secondary | ICD-10-CM | POA: Diagnosis not present

## 2022-07-17 MED ORDER — NYSTATIN 100000 UNIT/GM EX CREA: 1.0000 | TOPICAL_CREAM | Freq: Two times a day (BID) | CUTANEOUS | 4 refills | Status: DC

## 2022-07-17 MED ORDER — FLUCONAZOLE 150 MG PO TABS: 150.0000 mg | ORAL_TABLET | Freq: Once | ORAL | 0 refills | Status: AC

## 2022-07-17 MED ORDER — NYSTATIN 100000 UNIT/GM EX POWD: 1.0000 | Freq: Three times a day (TID) | CUTANEOUS | 2 refills | Status: DC

## 2022-07-17 NOTE — Progress Notes (Signed)
BP 127/83   Pulse 86   Temp 98 F (36.7 C) (Oral)   Ht 5\' 10"  (1.778 m)   Wt 281 lb 3.2 oz (127.6 kg)   SpO2 94%   BMI 40.35 kg/m    Subjective:    Patient ID: Nicholas [name], male    DOB: 1945/07/11, 77 y.o.   MRN: [id number]  HPI: Nicholas [name] is a 77 y.o. male  Chief Complaint  Patient presents with   Rash    For past 2 months in groin   RASH Presents today for rash that has been present for 2 months.  Tried chafing cream over the counter, cortisone 10.   Duration:  months  Location: groin  Itching: yes Burning: yes Redness: yes Oozing: no Scaling: no Blisters: no Painful: no Fevers: no Change in detergents/soaps/personal care products: no Recent illness: no Recent travel:no History of same: no Context: fluctuating Alleviating factors: nothing Treatments attempted:hydrocortisone cream and lotion/moisturizer Shortness of breath: no  Throat/tongue swelling: no Myalgias/arthralgias: no   Relevant past medical, surgical, family and social history reviewed and updated as indicated. Interim medical history since our last visit reviewed. Allergies and medications reviewed and updated.  Review of Systems  Constitutional:  Negative for activity change, appetite change, fatigue and fever.  Respiratory:  Positive for shortness of breath. Negative for cough, chest tightness and wheezing.   Cardiovascular:  Negative for chest pain, palpitations and leg swelling.  Endocrine: Negative for polydipsia, polyphagia and polyuria.  Skin:  Positive for rash.  Neurological: Negative.   Psychiatric/Behavioral: Negative.      Per HPI unless specifically indicated above     Objective:    BP 127/83   Pulse 86   Temp 98 F (36.7 C) (Oral)   Ht 5\' 10"  (1.778 m)   Wt 281 lb 3.2 oz (127.6 kg)   SpO2 94%   BMI 40.35 kg/m   Wt Readings from Last 3 Encounters:  07/17/22 281 lb 3.2 oz (127.6 kg)  06/06/22 285 lb (129.3 kg)  05/30/22 285 lb 6.4 oz (129.5 kg)    Physical  Exam Vitals and nursing note reviewed.  Constitutional:      General: He is awake. He is not in acute distress.    Appearance: He is well-developed and well-groomed. He is obese.  HENT:     Head: Normocephalic and atraumatic.     Right Ear: Hearing normal. No drainage.     Left Ear: Hearing normal. No drainage.  Eyes:     General: Lids are normal.        Right eye: No discharge.        Left eye: No discharge.     Conjunctiva/sclera: Conjunctivae normal.     Pupils: Pupils are equal, round, and reactive to light.  Neck:     Thyroid: No thyromegaly.     Vascular: No carotid bruit.  Cardiovascular:     Rate and Rhythm: Normal rate and regular rhythm.     Heart sounds: Normal heart sounds, S1 normal and S2 normal. No murmur heard.    No gallop.  Pulmonary:     Effort: Pulmonary effort is normal. No accessory muscle usage or respiratory distress.     Breath sounds: Normal breath sounds.  Abdominal:     General: Bowel sounds are normal. There is no distension.     Palpations: Abdomen is soft.     Tenderness: There is no abdominal tenderness.  Musculoskeletal:  General: Normal range of motion.     Cervical back: Normal range of motion and neck supple.     Right lower leg: No edema.     Left lower leg: No edema.  Skin:    General: Skin is warm and dry.     Findings: Rash present.     Comments: Moderate erythema around groin area.  Neurological:     Mental Status: He is alert and oriented to person, place, and time.  Psychiatric:        Attention and Perception: Attention normal.        Mood and Affect: Mood normal.        Behavior: Behavior normal. Behavior is cooperative.        Thought Content: Thought content normal.    Results for orders placed or performed in visit on 06/12/22  CUP PACEART REMOTE DEVICE CHECK  Result Value Ref Range   Date Time Interrogation Session [id number]    Pulse Generator Manufacturer MERM    Pulse Gen Model [id number] Advisa DR MRI     Pulse Gen Serial Number [id number]    Clinic Name [facility]    Implantable Pulse Generator Type Implantable Pulse Generator    Implantable Pulse Generator Implant Date 19147829    Implantable Lead Manufacturer MERM    Implantable Lead Model 3830 SelectSecure MRI SureScan    Implantable Lead Serial Number [id number]    Implantable Lead Implant Date 56213086    Implantable Lead Location Detail 1 UNKNOWN    Implantable Lead Special Function Bundle of His    Implantable Lead Location [id number]    Implantable Lead Connection Status [id number]    Implantable Lead Manufacturer MERM    Implantable Lead Model 5076 CapSureFix Novus MRI SureScan    Implantable Lead Serial Number [id number]    Implantable Lead Implant Date 57846962    Implantable Lead Location Detail 1 APPENDAGE    Implantable Lead Location [id number]    Implantable Lead Connection Status [id number]    Lead Channel Setting Sensing Sensitivity 0.6 mV   Lead Channel Setting Pacing Amplitude 2 V   Lead Channel Setting Pacing Pulse Width 1 ms   Lead Channel Setting Pacing Amplitude 2.25 V   Zone Setting Status 755011    Zone Setting Status [phone]    Lead Channel Impedance Value 380 ohm   Lead Channel Impedance Value 361 ohm   Lead Channel Sensing Intrinsic Amplitude 3.625 mV   Lead Channel Sensing Intrinsic Amplitude 3.625 mV   Lead Channel Pacing Threshold Amplitude 0.5 V   Lead Channel Pacing Threshold Pulse Width 0.4 ms   Lead Channel Impedance Value 437 ohm   Lead Channel Impedance Value 361 ohm   Lead Channel Sensing Intrinsic Amplitude 0.75 mV   Lead Channel Sensing Intrinsic Amplitude 0.75 mV   Lead Channel Pacing Threshold Amplitude 0.75 V   Lead Channel Pacing Threshold Pulse Width 0.4 ms   Battery Status OK    Battery Remaining Longevity 20 mo   Battery Voltage 2.92 V   Brady Statistic RA Percent Paced 30.14 %   Brady Statistic RV Percent Paced 99.98 %   Brady Statistic AP VP Percent 30.2 %   Brady Statistic AS VP Percent  69.79 %   Brady Statistic AP VS Percent 0 %   Brady Statistic AS VS Percent 0.01 %      Assessment & Plan:   Problem List Items Addressed This Visit       Musculoskeletal and Integument  Tinea cruris - Primary    Ongoing, is on Jardiance will need to monitor closely.  Start Nystatin cream and powder, alternating use -- educated on this. One dose of Diflucan sent in, hold statin for 48 hours.        Relevant Medications   nystatin (MYCOSTATIN/NYSTOP) powder   nystatin cream (MYCOSTATIN)   fluconazole (DIFLUCAN) 150 MG tablet     Follow up plan: Return for Scheduled August 7th, but please move this out to beginning of September.

## 2022-07-17 NOTE — Assessment & Plan Note (Signed)
Ongoing, is on Jardiance will need to monitor closely.  Start Nystatin cream and powder, alternating use -- educated on this. One dose of Diflucan sent in, hold statin for 48 hours.

## 2022-07-19 ENCOUNTER — Encounter: Payer: Self-pay | Admitting: Nurse Practitioner

## 2022-07-21 ENCOUNTER — Other Ambulatory Visit: Payer: Self-pay | Admitting: Internal Medicine

## 2022-07-21 ENCOUNTER — Other Ambulatory Visit: Payer: Self-pay | Admitting: Nurse Practitioner

## 2022-07-21 DIAGNOSIS — I4819 Other persistent atrial fibrillation: Secondary | ICD-10-CM

## 2022-07-21 DIAGNOSIS — F419 Anxiety disorder, unspecified: Secondary | ICD-10-CM

## 2022-07-21 DIAGNOSIS — F339 Major depressive disorder, recurrent, unspecified: Secondary | ICD-10-CM

## 2022-07-21 NOTE — Telephone Encounter (Signed)
Prescription refill request for Eliquis received. Indication:PE/DVT Last office visit: 04/04/22 Nicholas [name])  Scr: 1.11 (05/30/22)  Age: 77 Weight: 127.6kg  Appropriate dose. Refill sent.

## 2022-07-23 DIAGNOSIS — E1159 Type 2 diabetes mellitus with other circulatory complications: Secondary | ICD-10-CM | POA: Diagnosis not present

## 2022-07-23 DIAGNOSIS — I4891 Unspecified atrial fibrillation: Secondary | ICD-10-CM

## 2022-07-23 DIAGNOSIS — I10 Essential (primary) hypertension: Secondary | ICD-10-CM | POA: Diagnosis not present

## 2022-07-24 NOTE — Telephone Encounter (Signed)
Requested medication (s) are due for refill today - yes  Requested medication (s) are on the active medication list -yes  Future visit scheduled -yes  Last refill: 04/26/22 #90  Notes to clinic: non delegated Rx  Requested Prescriptions  Pending Prescriptions Disp Refills   LORazepam (ATIVAN) 1 MG tablet [Pharmacy Med Name: LORAZEPAM 1 MG TABLET] 90 tablet 0    Sig: TAKE 1 TABLET BY MOUTH EVERYDAY AT BEDTIME     Not Delegated - Psychiatry: Anxiolytics/Hypnotics 2 Failed - 07/21/2022 11:18 AM      Failed - This refill cannot be delegated      Failed - Urine Drug Screen completed in last 360 days      Passed - Patient is not pregnant      Passed - Valid encounter within last 6 months    Recent Outpatient Visits           1 week ago Tinea cruris   [place] [facility] [place], [place] T, NP   1 month ago Type 2 diabetes mellitus with obesity (HCC)   [place] [facility] [place], [place]ett[place] T, NP   2 months ago SOB (shortness of breath)   [place] [facility] [place], Corrie Dandy T, NP   2 months ago Subacute cough   [place] [facility] [place], [place] T, NP   2 months ago SOB (shortness of breath)   [place] [facility] [place], [name], NP       Future Appointments             In 2 months Cannady, [name], NP [facility], [facility]   In 7 months Vanna Scotland, MD [facility] [place]            Signed Prescriptions Disp Refills   albuterol (VENTOLIN HFA) 108 (90 Base) MCG/ACT inhaler 6.7 each 0    Sig: TAKE 2 PUFFS BY MOUTH EVERY 6 HOURS AS NEEDED     Pulmonology:  Beta Agonists 2 Passed - 07/21/2022 11:18 AM      Passed - Last BP in normal range    BP Readings from Last 1 Encounters:  07/17/22 127/83         Passed - Last Heart Rate in normal range    Pulse Readings from Last 1 Encounters:  07/17/22 86         Passed - Valid encounter within last 12  months    Recent Outpatient Visits           1 week ago Tinea cruris   [place] [facility] [place], [place] T, NP   1 month ago Type 2 diabetes mellitus with obesity (HCC)   [place] [facility] [place], [place] T, NP   2 months ago SOB (shortness of breath)   [place] [facility] [place], Corrie Dandy T, NP   2 months ago Subacute cough   [place] [facility] [place], [place] T, NP   2 months ago SOB (shortness of breath)   [place] [facility] [facility] [place], [name], NP       Future Appointments             In 2 months Cannady, [name], NP [place] [facility], [facility]   In 7 months Vanna Scotland, MD [facility] [place]            Refused Prescriptions Disp Refills   venlafaxine  XR (EFFEXOR-XR) 75 MG 24 hr capsule [Pharmacy Med Name: VENLAFAXINE HCL ER 75 MG CAP] 270 capsule 0    Sig: TAKE 3 CAPSULES BY MOUTH EVERY DAY     Psychiatry: Antidepressants - SNRI - desvenlafaxine & venlafaxine Failed - 07/21/2022 11:18 AM      Failed - Lipid Panel in normal range within the last 12 months    Cholesterol, Total  Date Value Ref Range Status  05/30/2022 133 100 - 199 mg/dL Final   Cholesterol Piccolo, Waived  Date Value Ref Range Status  01/03/2018 198 <200 mg/dL Final    Comment:                            Desirable                <200                         Borderline High      200- 239                         High                     >239    LDL Chol Calc (NIH)  Date Value Ref Range Status  05/30/2022 59 0 - 99 mg/dL Final   HDL  Date Value Ref Range Status  05/30/2022 49 >39 mg/dL Final   Triglycerides  Date Value Ref Range Status  05/30/2022 143 0 - 149 mg/dL Final   Triglycerides Piccolo,Waived  Date Value Ref Range Status  01/03/2018 264 (H) <150 mg/dL Final    Comment:                            Normal                   <150                         Borderline  High     150 - 199                         High                200 - 499                         Very High                >499          Passed - Cr in normal range and within 360 days    Creatinine  Date Value Ref Range Status  02/26/2014 0.94 0.60 - 1.30 mg/dL Final   Creatinine, Ser  Date Value Ref Range Status  05/30/2022 1.11 0.76 - 1.27 mg/dL Final         Passed - Completed PHQ-2 or PHQ-9 in the last 360 days      Passed - Last BP in normal range    BP Readings from Last 1 Encounters:  07/17/22 127/83         Passed - Valid encounter within last 6 months    Recent Outpatient Visits           1 week ago  Tinea cruris   [place] [facility] [place], [place] T, NP   1 month ago Type 2 diabetes mellitus with obesity (HCC)   [place] [facility] [place], Corrie Dandy T, NP   2 months ago SOB (shortness of breath)   [place] [facility] [place], Corrie Dandy T, NP   2 months ago Subacute cough   [place] [facility] [place], [place] T, NP   2 months ago SOB (shortness of breath)   [place] [facility] [place], [name], NP       Future Appointments             In 2 months Cannady, [name], NP [place] [facility], [facility]   In 7 months Vanna Scotland, MD [facility] [place]               Requested Prescriptions  Pending Prescriptions Disp Refills   LORazepam (ATIVAN) 1 MG tablet [Pharmacy Med Name: LORAZEPAM 1 MG TABLET] 90 tablet 0    Sig: TAKE 1 TABLET BY MOUTH EVERYDAY AT BEDTIME     Not Delegated - Psychiatry: Anxiolytics/Hypnotics 2 Failed - 07/21/2022 11:18 AM      Failed - This refill cannot be delegated      Failed - Urine Drug Screen completed in last 360 days      Passed - Patient is not pregnant      Passed - Valid encounter within last 6 months    Recent Outpatient Visits           1 week ago Tinea cruris   [place] [facility]  [place], [place] T, NP   1 month ago Type 2 diabetes mellitus with obesity (HCC)   [place] [facility] [place], [place] T, NP   2 months ago SOB (shortness of breath)   [place] [facility] [place], Corrie Dandy T, NP   2 months ago Subacute cough   [place] [facility] [place], [place] T, NP   2 months ago SOB (shortness of breath)   [place] [facility] [place], [name], NP       Future Appointments             In 2 months Cannady, [name], NP [place] [facility], [facility]   In 7 months Vanna Scotland, MD [facility] [place]            Signed Prescriptions Disp Refills   albuterol (VENTOLIN HFA) 108 (90 Base) MCG/ACT inhaler 6.7 each 0    Sig: TAKE 2 PUFFS BY MOUTH EVERY 6 HOURS AS NEEDED     Pulmonology:  Beta Agonists 2 Passed - 07/21/2022 11:18 AM      Passed - Last BP in normal range    BP Readings from Last 1 Encounters:  07/17/22 127/83         Passed - Last Heart Rate in normal range    Pulse Readings from Last 1 Encounters:  07/17/22 86         Passed - Valid encounter within last 12 months    Recent Outpatient Visits           1 week ago Tinea cruris   [place] [facility] [place], [place] T, NP   1 month ago Type 2 diabetes mellitus with obesity (HCC)   [place] [facility] [place], [place] T, NP   2 months ago SOB (shortness  of breath)   [place] [place], [place] T, NP   2 months ago Subacute cough   [place] [facility] [place], [place] T, NP   2 months ago SOB (shortness of breath)   [place] [facility] [place], Corrie Dandy T, NP       Future Appointments             In 2 months Cannady, [name], NP [facility], [facility]   In 7 months Vanna Scotland, MD [facility] [place]            Refused Prescriptions Disp Refills   venlafaxine XR  (EFFEXOR-XR) 75 MG 24 hr capsule [Pharmacy Med Name: VENLAFAXINE HCL ER 75 MG CAP] 270 capsule 0    Sig: TAKE 3 CAPSULES BY MOUTH EVERY DAY     Psychiatry: Antidepressants - SNRI - desvenlafaxine & venlafaxine Failed - 07/21/2022 11:18 AM      Failed - Lipid Panel in normal range within the last 12 months    Cholesterol, Total  Date Value Ref Range Status  05/30/2022 133 100 - 199 mg/dL Final   Cholesterol Piccolo, Waived  Date Value Ref Range Status  01/03/2018 198 <200 mg/dL Final    Comment:                            Desirable                <200                         Borderline High      200- 239                         High                     >239    LDL Chol Calc (NIH)  Date Value Ref Range Status  05/30/2022 59 0 - 99 mg/dL Final   HDL  Date Value Ref Range Status  05/30/2022 49 >39 mg/dL Final   Triglycerides  Date Value Ref Range Status  05/30/2022 143 0 - 149 mg/dL Final   Triglycerides Piccolo,Waived  Date Value Ref Range Status  01/03/2018 264 (H) <150 mg/dL Final    Comment:                            Normal                   <150                         Borderline High     150 - 199                         High                200 - 499                         Very High                >499          Passed - Cr in normal range and within 360 days  Creatinine  Date Value Ref Range Status  02/26/2014 0.94 0.60 - 1.30 mg/dL Final   Creatinine, Ser  Date Value Ref Range Status  05/30/2022 1.11 0.76 - 1.27 mg/dL Final         Passed - Completed PHQ-2 or PHQ-9 in the last 360 days      Passed - Last BP in normal range    BP Readings from Last 1 Encounters:  07/17/22 127/83         Passed - Valid encounter within last 6 months    Recent Outpatient Visits           1 week ago Tinea cruris   [place] [facility] [place], [place] T, NP   1 month ago Type 2 diabetes mellitus with obesity (HCC)   [place] [facility] [place], Corrie Dandy T, NP   2 months ago SOB (shortness of breath)   [place] [facility] [place], Corrie Dandy T, NP   2 months ago Subacute cough   [place] [facility] [place], [place] T, NP   2 months ago SOB (shortness of breath)   [place] [facility] [place], [name], NP       Future Appointments             In 2 months Cannady, [name], NP  [facility], [facility]   In 7 months Vanna Scotland, MD [facility]

## 2022-07-24 NOTE — Telephone Encounter (Signed)
Rx- venlafaxine XR  06/14/22 #270- too soon Requested Prescriptions  Pending Prescriptions Disp Refills   LORazepam (ATIVAN) 1 MG tablet [Pharmacy Med Name: LORAZEPAM 1 MG TABLET] 90 tablet 0    Sig: TAKE 1 TABLET BY MOUTH EVERYDAY AT BEDTIME     Not Delegated - Psychiatry: Anxiolytics/Hypnotics 2 Failed - 07/21/2022 11:18 AM      Failed - This refill cannot be delegated      Failed - Urine Drug Screen completed in last 360 days      Passed - Patient is not pregnant      Passed - Valid encounter within last 6 months    Recent Outpatient Visits           1 week ago Tinea cruris   [place] [facility] [place], [place] T, NP   1 month ago Type 2 diabetes mellitus with obesity (HCC)   [place] [facility] [place], [place] T, NP   2 months ago SOB (shortness of breath)   [place] [facility] [place], Corrie Dandy T, NP   2 months ago Subacute cough   [place] [facility] [place], [place] T, NP   2 months ago SOB (shortness of breath)   [place] [facility] [place], [name], NP       Future Appointments             In 2 months Cannady, [name], NP [facility]   In 7 months Vanna Scotland, MD [facility] [place]             albuterol (VENTOLIN HFA) 108 (90 Base) MCG/ACT inhaler [Pharmacy Med Name: ALBUTEROL HFA (PROVENTIL) INH] 6.7 each 0    Sig: TAKE 2 PUFFS BY MOUTH EVERY 6 HOURS AS NEEDED     Pulmonology:  Beta Agonists 2 Passed - 07/21/2022 11:18 AM      Passed - Last BP in normal range    BP Readings from Last 1 Encounters:  07/17/22 127/83         Passed - Last Heart Rate in normal range    Pulse Readings from Last 1 Encounters:  07/17/22 86         Passed - Valid encounter within last 12 months    Recent Outpatient Visits           1 week ago Tinea cruris   [place] [facility] [place], [place] T, NP   1 month ago Type 2 diabetes  mellitus with obesity (HCC)   [place] [facility] [place], [place] T, NP   2 months ago SOB (shortness of breath)   [place] [facility] [place], Corrie Dandy T, NP   2 months ago Subacute cough   [place] [facility] [place], [place] T, NP   2 months ago SOB (shortness of breath)   [place] [facility] [place], [name], NP       Future Appointments             In 2 months Marjie Skiff, NP  [facility]   In 7 months Vanna Scotland, MD [facility] [place]            Refused Prescriptions Disp Refills   venlafaxine XR (EFFEXOR-XR) 75 MG 24 hr capsule [Pharmacy Med Name: VENLAFAXINE HCL ER 75 MG CAP] 270 capsule 0    Sig: TAKE 3 CAPSULES BY MOUTH EVERY  DAY     Psychiatry: Antidepressants - SNRI - desvenlafaxine & venlafaxine Failed - 07/21/2022 11:18 AM      Failed - Lipid Panel in normal range within the last 12 months    Cholesterol, Total  Date Value Ref Range Status  05/30/2022 133 100 - 199 mg/dL Final   Cholesterol Piccolo, Waived  Date Value Ref Range Status  01/03/2018 198 <200 mg/dL Final    Comment:                            Desirable                <200                         Borderline High      200- 239                         High                     >239    LDL Chol Calc (NIH)  Date Value Ref Range Status  05/30/2022 59 0 - 99 mg/dL Final   HDL  Date Value Ref Range Status  05/30/2022 49 >39 mg/dL Final   Triglycerides  Date Value Ref Range Status  05/30/2022 143 0 - 149 mg/dL Final   Triglycerides Piccolo,Waived  Date Value Ref Range Status  01/03/2018 264 (H) <150 mg/dL Final    Comment:                            Normal                   <150                         Borderline High     150 - 199                         High                200 - 499                         Very High                >499          Passed - Cr in normal  range and within 360 days    Creatinine  Date Value Ref Range Status  02/26/2014 0.94 0.60 - 1.30 mg/dL Final   Creatinine, Ser  Date Value Ref Range Status  05/30/2022 1.11 0.76 - 1.27 mg/dL Final         Passed - Completed PHQ-2 or PHQ-9 in the last 360 days      Passed - Last BP in normal range    BP Readings from Last 1 Encounters:  07/17/22 127/83         Passed - Valid encounter within last 6 months    Recent Outpatient Visits           1 week ago Tinea cruris   [place] [facility] [place], Joaquin T, NP   1 month ago Type 2 diabetes mellitus with obesity (HCC)   [place]  [facility] [place], Jaconita T, NP   2 months ago SOB (shortness of breath)   [place] [facility] [place], [name], NP   2 months ago Subacute cough   [place] [facility] [place], [place] T, NP   2 months ago SOB (shortness of breath)   [place] [facility] Marjie Skiff, NP       Future Appointments             In 2 months Cannady, [name], NP [place] [facility], [facility]   In 7 months Vanna Scotland, MD [facility]

## 2022-08-01 ENCOUNTER — Ambulatory Visit: Payer: Medicare Other | Attending: Anesthesiology | Admitting: Anesthesiology

## 2022-08-01 ENCOUNTER — Encounter: Payer: Self-pay | Admitting: Anesthesiology

## 2022-08-01 DIAGNOSIS — M62830 Muscle spasm of back: Secondary | ICD-10-CM

## 2022-08-01 DIAGNOSIS — M5416 Radiculopathy, lumbar region: Secondary | ICD-10-CM

## 2022-08-01 DIAGNOSIS — M47817 Spondylosis without myelopathy or radiculopathy, lumbosacral region: Secondary | ICD-10-CM

## 2022-08-01 DIAGNOSIS — G8929 Other chronic pain: Secondary | ICD-10-CM

## 2022-08-01 DIAGNOSIS — Z79891 Long term (current) use of opiate analgesic: Secondary | ICD-10-CM

## 2022-08-01 DIAGNOSIS — F1190 Opioid use, unspecified, uncomplicated: Secondary | ICD-10-CM

## 2022-08-01 DIAGNOSIS — G894 Chronic pain syndrome: Secondary | ICD-10-CM | POA: Diagnosis not present

## 2022-08-01 DIAGNOSIS — R1031 Right lower quadrant pain: Secondary | ICD-10-CM

## 2022-08-01 DIAGNOSIS — M5431 Sciatica, right side: Secondary | ICD-10-CM | POA: Diagnosis not present

## 2022-08-01 DIAGNOSIS — M5136 Other intervertebral disc degeneration, lumbar region: Secondary | ICD-10-CM

## 2022-08-01 DIAGNOSIS — M5432 Sciatica, left side: Secondary | ICD-10-CM

## 2022-08-01 DIAGNOSIS — M79642 Pain in left hand: Secondary | ICD-10-CM

## 2022-08-01 DIAGNOSIS — M47816 Spondylosis without myelopathy or radiculopathy, lumbar region: Secondary | ICD-10-CM

## 2022-08-01 DIAGNOSIS — M1611 Unilateral primary osteoarthritis, right hip: Secondary | ICD-10-CM

## 2022-08-01 DIAGNOSIS — M79641 Pain in right hand: Secondary | ICD-10-CM

## 2022-08-01 DIAGNOSIS — M4726 Other spondylosis with radiculopathy, lumbar region: Secondary | ICD-10-CM

## 2022-08-01 MED ORDER — HYDROCODONE-ACETAMINOPHEN 5-325 MG PO TABS: 1.0000 | ORAL_TABLET | Freq: Four times a day (QID) | ORAL | 0 refills | Status: DC | PRN

## 2022-08-01 MED ORDER — HYDROCODONE-ACETAMINOPHEN 5-325 MG PO TABS: 1.0000 | ORAL_TABLET | Freq: Four times a day (QID) | ORAL | 0 refills | Status: AC | PRN

## 2022-08-02 NOTE — Progress Notes (Signed)
Virtual Visit via Telephone Note  I connected with Nicholas [name] on 08/02/22 at  1:00 PM EDT by telephone and verified that I am speaking with the correct person using two identifiers.  Location: Patient: Home Provider: Pain control center   I discussed the limitations, risks, security and privacy concerns of performing an evaluation and management service by telephone and the availability of in person appointments. I also discussed with the patient that there may be a patient responsible charge related to this service. The patient expressed understanding and agreed to proceed.   History of Present Illness:  I spoke with Nicholas [name] via telephone as we are unable lengthen the video portion of the conference.  He reports that he is doing reasonably well with his low back pain.  He is trying to stay active but has had some other health root related problems that have limited his overall activity level.  He is hoping that once he gets these resolved he will be able to get back to playing more golf.  In regards to the medications he feels that these are enabling him to stay active and functional to the best of his ability at this present time.  He gets about 75% relief with the medicines and is without side effect.  No change in lower extremity strength function bowel or bladder function is noted at this time.  Review of systems: General: No fevers or chills Pulmonary: No shortness of breath or dyspnea Cardiac: No angina or palpitations or lightheadedness GI: No abdominal pain or constipation Psych: No depression  Observations/Objective:  Current Outpatient Medications:    HYDROcodone-acetaminophen (NORCO/VICODIN) 5-325 MG tablet, Take 1 tablet by mouth every 6 (six) hours as needed for moderate pain or severe pain., Disp: 60 tablet, Rfl: 0   [START ON 08/31/2022] HYDROcodone-acetaminophen (NORCO/VICODIN) 5-325 MG tablet, Take 1 tablet by mouth every 6 (six) hours as needed for moderate pain or  severe pain., Disp: 60 tablet, Rfl: 0   acetaminophen (TYLENOL) 650 MG CR tablet, Take 650 mg by mouth every 8 (eight) hours as needed for pain., Disp: , Rfl:    albuterol (VENTOLIN HFA) 108 (90 Base) MCG/ACT inhaler, TAKE 2 PUFFS BY MOUTH EVERY 6 HOURS AS NEEDED, Disp: 6.7 each, Rfl: 0   Ascorbic Acid (VITAMIN C) 1000 MG tablet, Take 1,000 mg by mouth daily., Disp: , Rfl:    Blood Glucose Monitoring Suppl (ONETOUCH VERIO) w/Device KIT, Use to check blood sugar 3 times a day and document results, bring to appointments.  Goal is <130 fasting blood sugar and <180 two hours after meals., Disp: 1 kit, Rfl: 0   calcium-vitamin D (OSCAL WITH D) 500-5 MG-MCG tablet, Take 1 tablet by mouth daily with breakfast., Disp: , Rfl:    Cranberry 500 MG CAPS, Take 500 mg by mouth daily., Disp: , Rfl:    ELIQUIS 5 MG TABS tablet, TAKE 1 TABLET BY MOUTH TWICE A DAY, Disp: 180 tablet, Rfl: 1   empagliflozin (JARDIANCE) 25 MG TABS tablet, Take 1 tablet (25 mg total) by mouth daily., Disp: 30 tablet, Rfl: 4   gabapentin (NEURONTIN) 600 MG tablet, TAKE 1 TABLET BY MOUTH EVERYDAY AT BEDTIME, Disp: 90 tablet, Rfl: 4   glucose blood test strip, Use to check blood sugar 3 times a day and document results, bring to appointments.  Goal is <130 fasting blood sugar and <180 two hours after meals., Disp: 100 each, Rfl: 12   Lancets (ONETOUCH ULTRASOFT) lancets, Use to check blood sugar  3 times a day and document results, bring to appointments.  Goal is <130 fasting blood sugar and <180 two hours after meals., Disp: 100 each, Rfl: 12   LORazepam (ATIVAN) 1 MG tablet, TAKE 1 TABLET BY MOUTH EVERYDAY AT BEDTIME, Disp: 90 tablet, Rfl: 0   Magnesium 400 MG TABS, Take 400 mg by mouth daily. , Disp: , Rfl:    MELATONIN PO, Take by mouth., Disp: , Rfl:    midodrine (PROAMATINE) 10 MG tablet, Take 0.5 tablets (5 mg total) by mouth 3 (three) times daily., Disp: 45 tablet, Rfl: 3   Multiple Vitamin (MULTIVITAMIN WITH MINERALS) TABS tablet,  Take 1 tablet by mouth daily. Centrum Silver, Disp: , Rfl:    nystatin (MYCOSTATIN/NYSTOP) powder, Apply 1 Application topically 3 (three) times daily., Disp: 60 g, Rfl: 2   nystatin cream (MYCOSTATIN), Apply 1 Application topically 2 (two) times daily., Disp: 30 g, Rfl: 4   Omega-3 Fatty Acids (FISH OIL) 1200 MG CAPS, Take 1,200 mg by mouth daily., Disp: , Rfl:    oxymetazoline (AFRIN) 0.05 % nasal spray, Place 2 sprays into both nostrils at bedtime., Disp: , Rfl:    pantoprazole (PROTONIX) 20 MG tablet, Take 1 tablet (20 mg total) by mouth 2 (two) times daily before a meal., Disp: 180 tablet, Rfl: 4   potassium chloride SA (KLOR-CON M) 20 MEQ tablet, Take 2 tablets (40 meq) with Torsemide, Disp: 30 tablet, Rfl: 3   QUEtiapine Fumarate (SEROQUEL XR) 150 MG 24 hr tablet, TAKE 1 TABLET BY MOUTH EVERYDAY AT BEDTIME, Disp: 90 tablet, Rfl: 4   rOPINIRole (REQUIP) 0.25 MG tablet, Take 0.25 mg by mouth 4 (four) times daily., Disp: , Rfl:    rosuvastatin (CRESTOR) 40 MG tablet, TAKE 1 TABLET BY MOUTH EVERY DAY, Disp: 90 tablet, Rfl: 1   solifenacin (VESICARE) 10 MG tablet, TAKE 1 TABLET BY MOUTH EVERY DAY, Disp: 90 tablet, Rfl: 3   tiZANidine (ZANAFLEX) 4 MG tablet, Take 1 tablet (4 mg total) by mouth every 6 (six) hours as needed for muscle spasms., Disp: 90 tablet, Rfl: 4   torsemide (DEMADEX) 20 MG tablet, TAKE 2 TABLETS (40 MG TOTAL) BY MOUTH DAILY AS NEEDED (FLUID RETENTION)., Disp: 90 tablet, Rfl: 0   venlafaxine XR (EFFEXOR-XR) 75 MG 24 hr capsule, TAKE 3 CAPSULES BY MOUTH EVERY DAY, Disp: 270 capsule, Rfl: 0   Past Medical History:  Diagnosis Date   Abdominal pain, chronic, right lower quadrant 12/13/2020   Anterior urethral stricture    Anxiety    Arthritis    a. knees, hips, hands;  b. 11/2013 s/p L TKA @ [facility].   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 Chronic pain syndrome   2. Lumbar radiculopathy   3. Degeneration of lumbar intervertebral disc   4. Bilateral sciatica   5. Chronic, continuous use of opioids   6. Bilateral hand pain   7. Facet syndrome, lumbar   8. Osteoarthritis of right hip, unspecified osteoarthritis type   9. Spasm  of muscle of lower back   10. Abdominal pain, chronic, right lower quadrant   11. Facet arthritis of lumbosacral region    Based on our conversation it is appropriate to refill his medicines for the next 2 months dated for July 9 and August 8.  No other changes in his pharmacologic regimen will be initiated.  I encouraged him to stay active and continue efforts at weight loss and hopefully he will be feeling better soon and able to get back out to the golf course.  I have reviewed the [facility] practitioner database information and it is appropriate.  Will schedule him for return to clinic in 2 months.  I encouraged him to continue follow-up with his primary care physicians for baseline medical care. Follow Up Instructions:    I discussed the assessment and treatment plan with the patient. The patient was provided an opportunity to ask questions and all were answered. The patient agreed with the plan and demonstrated an understanding of the instructions.   The patient was advised to call back or seek an in-person evaluation if the symptoms worsen or if the condition fails to improve as anticipated.  I provided 30 minutes of non-face-to-face time during this encounter.   Yevette Edwards, MD

## 2022-08-30 ENCOUNTER — Ambulatory Visit: Payer: Medicare Other | Admitting: Nurse Practitioner

## 2022-09-05 ENCOUNTER — Other Ambulatory Visit: Payer: Medicare Other

## 2022-09-05 DIAGNOSIS — N400 Enlarged prostate without lower urinary tract symptoms: Secondary | ICD-10-CM

## 2022-09-11 ENCOUNTER — Ambulatory Visit (INDEPENDENT_AMBULATORY_CARE_PROVIDER_SITE_OTHER): Payer: Medicare Other

## 2022-09-11 DIAGNOSIS — I442 Atrioventricular block, complete: Secondary | ICD-10-CM

## 2022-09-12 ENCOUNTER — Other Ambulatory Visit: Payer: Self-pay

## 2022-09-12 ENCOUNTER — Telehealth: Payer: Self-pay | Admitting: Internal Medicine

## 2022-09-12 ENCOUNTER — Telehealth: Payer: Self-pay | Admitting: Anesthesiology

## 2022-09-12 LAB — CUP PACEART REMOTE DEVICE CHECK
Battery Remaining Longevity: 16 mo
Battery Voltage: 2.91 V
Brady Statistic AP VP Percent: 35.67 %
Brady Statistic AP VS Percent: 0 %
Brady Statistic AS VP Percent: 64.32 %
Brady Statistic AS VS Percent: 0.01 %
Brady Statistic RA Percent Paced: 35.63 %
Brady Statistic RV Percent Paced: 99.98 %
Date Time Interrogation Session: 20240819100253
Implantable Lead Connection Status: 753985
Implantable Lead Connection Status: 753985
Implantable Lead Implant Date: 20180124
Implantable Lead Implant Date: 20180124
Implantable Lead Location: 753859
Implantable Lead Location: 753860
Implantable Lead Model: 3830
Implantable Lead Model: 5076
Implantable Pulse Generator Implant Date: 20180124
Lead Channel Impedance Value: 342 Ohm
Lead Channel Impedance Value: 342 Ohm
Lead Channel Impedance Value: 380 Ohm
Lead Channel Impedance Value: 437 Ohm
Lead Channel Pacing Threshold Amplitude: 0.5 V
Lead Channel Pacing Threshold Amplitude: 0.75 V
Lead Channel Pacing Threshold Pulse Width: 0.4 ms
Lead Channel Pacing Threshold Pulse Width: 0.4 ms
Lead Channel Sensing Intrinsic Amplitude: 0.75 mV
Lead Channel Sensing Intrinsic Amplitude: 0.75 mV
Lead Channel Sensing Intrinsic Amplitude: 3.375 mV
Lead Channel Sensing Intrinsic Amplitude: 3.375 mV
Lead Channel Setting Pacing Amplitude: 2 V
Lead Channel Setting Pacing Amplitude: 2.25 V
Lead Channel Setting Pacing Pulse Width: 1 ms
Lead Channel Setting Sensing Sensitivity: 0.6 mV
Zone Setting Status: 755011
Zone Setting Status: 755011

## 2022-09-12 MED ORDER — MIDODRINE HCL 10 MG PO TABS: 5.0000 mg | ORAL_TABLET | Freq: Three times a day (TID) | ORAL | 0 refills | Status: DC

## 2022-09-12 NOTE — Telephone Encounter (Signed)
*  STAT* If patient is at the pharmacy, call can be transferred to refill team.   1. Which medications need to be refilled? (please list name of each medication and dose if known)  midodrine (PROAMATINE) 10 MG tablet     2. Would you like to learn more about the convenience, safety, & potential cost savings by using the [facility]? No      3. Are you open to using the [facility] (Type [facility]. No ).   4. Which pharmacy/location (including street and city if local pharmacy) is medication to be sent to? [id number] - [place], [place] - [address]    5. Do they need a 30 day or 90 day supply? 90

## 2022-09-12 NOTE — Telephone Encounter (Signed)
Refill request sent to Dr Andree Elk

## 2022-09-12 NOTE — Telephone Encounter (Signed)
Requested Prescriptions   Signed Prescriptions Disp Refills   midodrine (PROAMATINE) 10 MG tablet 135 tablet 0    Sig: Take 0.5 tablets (5 mg total) by mouth 3 (three) times daily.    Authorizing Provider: Duke Salvia    Ordering User: Guerry Minors

## 2022-09-12 NOTE — Telephone Encounter (Signed)
PT will like for Hydrocodone 5-325 to be send to [facility] in [place]. PT states that the current pharmacy is out of stock. Please give patient a call. TY

## 2022-09-12 NOTE — Telephone Encounter (Signed)
This is a [place] pt 

## 2022-09-13 MED ORDER — HYDROCODONE-ACETAMINOPHEN 5-325 MG PO TABS: 1.0000 | ORAL_TABLET | Freq: Four times a day (QID) | ORAL | 0 refills | Status: AC | PRN

## 2022-09-14 ENCOUNTER — Telehealth: Payer: Self-pay

## 2022-09-14 ENCOUNTER — Other Ambulatory Visit: Payer: Medicare Other

## 2022-09-14 ENCOUNTER — Other Ambulatory Visit: Payer: Self-pay

## 2022-09-14 NOTE — Patient Outreach (Signed)
  Care Management   Follow Up Note   09/14/2022 Name: Nicholas [name] MRN: [id number] DOB: 1945/10/05   Referred by: Marjie Skiff, NP Reason for referral : Care Management (RNCM: Follow up for Chronic Disease Management and Care Coordination Needs- attempt)   An unsuccessful telephone outreach was attempted today. The patient was referred to the case management team for assistance with care management and care coordination.   Follow Up Plan: A HIPPA compliant phone message was left for the patient providing contact information and requesting a return call.   Alto Denver RN, MSN, CCM RN Care Manager  [facility]  Ambulatory Care Management  Direct Number: [phone]

## 2022-09-14 NOTE — Patient Outreach (Signed)
Care Management   Visit Note  09/14/2022 Name: ABDIFATAH TARWATER MRN: [id number] DOB: 1945/07/12  Subjective: Nicholas [name] is a 77 y.o. year old male who is a primary care patient of Cannady, [name], NP. The Care Management team was consulted for assistance.      Engaged with patient spoke with patient by telephone.    Goals Addressed             This Visit's Progress    RNCM (HTN) EXPECTED OUTCOME: MONITOR, SELF-MANAGE AND REDUCE SYMPTOMS OF HTN       Current Barriers:  Knowledge Deficits related to benefits of taking blood pressures on a regular basis and normal values  Chronic Disease Management support and education needs related to effective management of HTN   BP Readings from Last 3 Encounters:  07/17/22 127/83  05/30/22 128/80  05/12/22 (!) 139/90     Planned Interventions: Evaluation of current treatment plan related to hypertension self management and patient's adherence to plan as established by provider. The patient denies any issues with HTN or heart health. States he is having shortness of breath and no energy. Saw pulmonologist and has been referred to [facility] for follow up. He goes to [facility] on 09-18-2022 and will see several different providers at this time. The patient states he can tell the shortness of breath and no energy is getting worse. Reflective listening and support given.;   Provided education to patient re: stroke prevention, s/s of heart attack and stroke; Reviewed prescribed diet heart healthy/ADA diet. The patient is compliant with a heart healthy/ADA diet. Denies any needs related to dietary restrictions Reviewed medications with patient and discussed importance of compliance. Patient states compliance with medications. Works with pharm D for ongoing support and education.   New appointment with the pharm D scheduled for 09-29-2022 Discussed plans with patient for ongoing care management follow up and provided patient with direct contact information for care  management team; Advised patient, providing education and rationale, to monitor blood pressure daily and record, calling PCP for findings outside established parameters. The patient states that his blood pressures are higher now. He has not had to take the medications to raise his blood pressures. Education on systolic goal of <409 and diastolic of <90. Denies any issues with hypotension at this time. Has had to take medications in the past Reviewed scheduled/upcoming provider appointments including: 10-02-2022 at 0920 am with the pcp- reminder given today Advised patient to discuss changes in blood pressures or heart health with provider; Provided education on prescribed diet heart healthy/ADA diet;  Discussed complications of poorly controlled blood pressure such as heart disease, stroke, circulatory complications, vision complications, kidney impairment, sexual dysfunction;  Screening for signs and symptoms of depression related to chronic disease state;  Assessed social determinant of health barriers;   Symptom Management: Take medications as prescribed   Attend all scheduled provider appointments Perform all self care activities independently  Call provider office for new concerns or questions  call the Suicide and Crisis Lifeline: 988 call the [place] National Suicide Prevention Lifeline: 403-715-6178 or TTY: (940)655-7250 TTY [phone]) to talk to a trained counselor call 1-800-273-TALK (toll free, 24 hour hotline) if experiencing a Mental Health or Behavioral Health Crisis  check blood pressure weekly learn about high blood pressure keep a blood pressure log take blood pressure log to all doctor appointments call doctor for signs and symptoms of high blood pressure develop an action plan for high blood pressure keep all doctor appointments  take medications for blood pressure exactly as prescribed report new symptoms to your doctor  Follow Up Plan: Telephone follow up appointment  with care management team member scheduled for: 10-26-2022 at 230 pm       RNCM Care Management Expected Outcome:  Monitor, Self-Manage and Reduce Symptoms of Afib       Current Barriers:  Knowledge Deficits related to how to tell if his heart rate is out of rhythm  with condition of AFIB Chronic Disease Management support and education needs related to effective management of AFIB  Planned Interventions: Provider order and care plan reviewed. Collaborated with PharmD regarding patient care and plan. Takes Eliquis 5mg  BID. Works with pharm D for ongoing support and education. The patient states he is having a hard time paying for his Eliquis. Is receptive to talking to the pharm D for help with financial assistance with medication cost. Appointment scheduled for September with pharm D.  Counseled on increased risk of stroke due to Afib and benefits of anticoagulation for stroke prevention. Review of sx and sx and the patient states he cannot tell when he is going into AFIB. Most recent pacemaker record indicated in the last 90 days he had one episode of AFIB that last 90 minutes. Review of sx and sx and the patient is having dizziness, increased fatigue and shortness of breath. The patient is going to [facility] on  09-18-2022 and will see several providers on this date to see what may be going on and is hopeful for answers.          Reviewed importance of adherence to anticoagulant exactly as prescribed. The patient is compliant with medications. Is compliant with medications. Is willing to talk to the pharm D for medications assistance for Eliquis cost constraints.  Advised patient to discuss changes in heart rate, changes in activity level, acute onset of shortness of breath, or other questions and concerns related to AFIB with provider Counseled on bleeding risk associated with AFIB and importance of self-monitoring for signs/symptoms of bleeding Counseled on avoidance of NSAIDs due to increased  bleeding risk with anticoagulants Counseled on importance of regular laboratory monitoring as prescribed. The patient has lab work as requested by providers.  Counseled on seeking medical attention after a head injury or if there is blood in the urine/stool Afib action plan reviewed. The patient goes to [facility] on 09-18-2022 and will see several providers. The patient and his wife were asking questions and the RNCM used EBP tool to educate the patient and his wife on what a 2nd degree heart block, Wenckebach is. The patient has been having sx and sx of dizziness, shortness of breath, and fatigue. He hopes Monday will offer answers to why he is feeling the way he is. He denies any acute distress today. Reflective listening and support given. Will continue to monitor.  Screening for signs and symptoms of depression related to chronic disease state Assessed social determinant of health barriers  Symptom Management: Take medications as prescribed   Attend all scheduled provider appointments Call provider office for new concerns or questions  call the Suicide and Crisis Lifeline: 988 call the [place] National Suicide Prevention Lifeline: 504-199-9637 or TTY: 2505560307 TTY 737-457-6129) to talk to a trained counselor call 1-800-273-TALK (toll free, 24 hour hotline) if experiencing a Mental Health or Behavioral Health Crisis  - make a plan to eat healthy - keep all lab appointments - take medicine as prescribed  Follow Up Plan: Telephone follow up appointment  with care management team member scheduled for: 10-26-2022 at 230 pm       RNCM Care Management:  Expected Outcome:  Monitor, Self-Manage and Reduce Symptoms of Diabetes       Current Barriers:  Knowledge Deficits related to monitoring for changes in blood sugars and the effect of elevated blood sugars on other body systems Chronic Disease Management support and education needs related to effective management of DM Lab Results  Component Value  Date   HGBA1C 7.0 (H) 05/30/2022     Planned Interventions: Provided education to patient about basic DM disease process. The patient with slightly elevated A1C level. The patient is mindful of changes. He feels overall he is doing well except for the shortness of breath and low energy level. He is being proactive with appointments and follow up with specialist. Education and support given. ; Reviewed medications with patient and discussed importance of medication adherence. The patient is compliant with medications. Did not tolerate Ozempic. Is currently controlling DM with diet.         Reviewed prescribed diet with patient heart healthy/ADA diet. Patient is mindful of dietary restrictions. The patient denies any issues with dietary restrictions ; Counseled on importance of regular laboratory monitoring as prescribed. Has regular lab testing;        Discussed plans with patient for ongoing care management follow up and provided patient with direct contact information for care management team;      Provided patient with written educational materials related to hypo and hyperglycemia and importance of correct treatment;       Reviewed scheduled/upcoming provider appointments including: 10-02-2022 at 920 am. Reminder given today         Advised patient, providing education and rationale, to check cbg when you have symptoms of low or high blood sugar and as directed   and record. The patient has not been checking his blood sugars.        Referral made to pharmacy team for assistance with medication reconciliation, education and support. Works with pharm D on a consistent basis;       Review of patient status, including review of consultants reports, relevant laboratory and other test results, and medications completed;       Advised patient to discuss changes in DM health and well being with provider;      Screening for signs and symptoms of depression related to chronic disease state;        Assessed  social determinant of health barriers;       Eye exam completed today (02-09-2022). Denies any concerns with vision and states he received a good report.    Symptom Management: Take medications as prescribed   Attend all scheduled provider appointments Call provider office for new concerns or questions  call the Suicide and Crisis Lifeline: 988 call the [place] National Suicide Prevention Lifeline: 709-248-9041 or TTY: 225-780-1677 TTY 719-613-1482) to talk to a trained counselor call 1-800-273-TALK (toll free, 24 hour hotline) if experiencing a Mental Health or Behavioral Health Crisis  check feet daily for cuts, sores or redness trim toenails straight across manage portion size wash and dry feet carefully every day wear comfortable, cotton socks wear comfortable, well-fitting shoes  Follow Up Plan: Telephone follow up appointment with care management team member scheduled for: 10-26-2022 at 230 pm          Consent to Services:  Patient was given information about care management services, agreed to services, and gave verbal consent to participate.  Plan: Telephone follow up appointment with care management team member scheduled for: 10-26-2022 at 230 pm  Alto Denver RN, MSN, CCM RN Care Manager  [facility]  Ambulatory Care Management  Direct Number: [phone]

## 2022-09-14 NOTE — Patient Instructions (Signed)
Visit Information  Thank you for taking time to visit with me today. Please don't hesitate to contact me if I can be of assistance to you before our next scheduled telephone appointment.  Following are the goals we discussed today:   Goals Addressed             This Visit's Progress    RNCM (HTN) EXPECTED OUTCOME: MONITOR, SELF-MANAGE AND REDUCE SYMPTOMS OF HTN       Current Barriers:  Knowledge Deficits related to benefits of taking blood pressures on a regular basis and normal values  Chronic Disease Management support and education needs related to effective management of HTN   BP Readings from Last 3 Encounters:  07/17/22 127/83  05/30/22 128/80  05/12/22 (!) 139/90     Planned Interventions: Evaluation of current treatment plan related to hypertension self management and patient's adherence to plan as established by provider. The patient denies any issues with HTN or heart health. States he is having shortness of breath and no energy. Saw pulmonologist and has been referred to [facility] for follow up. He goes to [facility] on 09-18-2022 and will see several different providers at this time. The patient states he can tell the shortness of breath and no energy is getting worse. Reflective listening and support given.;   Provided education to patient re: stroke prevention, s/s of heart attack and stroke; Reviewed prescribed diet heart healthy/ADA diet. The patient is compliant with a heart healthy/ADA diet. Denies any needs related to dietary restrictions Reviewed medications with patient and discussed importance of compliance. Patient states compliance with medications. Works with pharm D for ongoing support and education.   New appointment with the pharm D scheduled for 09-29-2022 Discussed plans with patient for ongoing care management follow up and provided patient with direct contact information for care management team; Advised patient, providing education and rationale, to monitor blood  pressure daily and record, calling PCP for findings outside established parameters. The patient states that his blood pressures are higher now. He has not had to take the medications to raise his blood pressures. Education on systolic goal of <416 and diastolic of <90. Denies any issues with hypotension at this time. Has had to take medications in the past Reviewed scheduled/upcoming provider appointments including: 10-02-2022 at 0920 am with the pcp- reminder given today Advised patient to discuss changes in blood pressures or heart health with provider; Provided education on prescribed diet heart healthy/ADA diet;  Discussed complications of poorly controlled blood pressure such as heart disease, stroke, circulatory complications, vision complications, kidney impairment, sexual dysfunction;  Screening for signs and symptoms of depression related to chronic disease state;  Assessed social determinant of health barriers;   Symptom Management: Take medications as prescribed   Attend all scheduled provider appointments Perform all self care activities independently  Call provider office for new concerns or questions  call the Suicide and Crisis Lifeline: 988 call the [place] National Suicide Prevention Lifeline: 928-727-5704 or TTY: 603-083-7777 TTY 850-042-6807) to talk to a trained counselor call 1-800-273-TALK (toll free, 24 hour hotline) if experiencing a Mental Health or Behavioral Health Crisis  check blood pressure weekly learn about high blood pressure keep a blood pressure log take blood pressure log to all doctor appointments call doctor for signs and symptoms of high blood pressure develop an action plan for high blood pressure keep all doctor appointments take medications for blood pressure exactly as prescribed report new symptoms to your doctor  Follow Up Plan: Telephone follow  up appointment with care management team member scheduled for: 10-26-2022 at 230 pm       RNCM Care  Management Expected Outcome:  Monitor, Self-Manage and Reduce Symptoms of Afib       Current Barriers:  Knowledge Deficits related to how to tell if his heart rate is out of rhythm  with condition of AFIB Chronic Disease Management support and education needs related to effective management of AFIB  Planned Interventions: Provider order and care plan reviewed. Collaborated with PharmD regarding patient care and plan. Takes Eliquis 5mg  BID. Works with pharm D for ongoing support and education. The patient states he is having a hard time paying for his Eliquis. Is receptive to talking to the pharm D for help with financial assistance with medication cost. Appointment scheduled for September with pharm D.  Counseled on increased risk of stroke due to Afib and benefits of anticoagulation for stroke prevention. Review of sx and sx and the patient states he cannot tell when he is going into AFIB. Most recent pacemaker record indicated in the last 90 days he had one episode of AFIB that last 90 minutes. Review of sx and sx and the patient is having dizziness, increased fatigue and shortness of breath. The patient is going to [facility] on  09-18-2022 and will see several providers on this date to see what may be going on and is hopeful for answers.          Reviewed importance of adherence to anticoagulant exactly as prescribed. The patient is compliant with medications. Is compliant with medications. Is willing to talk to the pharm D for medications assistance for Eliquis cost constraints.  Advised patient to discuss changes in heart rate, changes in activity level, acute onset of shortness of breath, or other questions and concerns related to AFIB with provider Counseled on bleeding risk associated with AFIB and importance of self-monitoring for signs/symptoms of bleeding Counseled on avoidance of NSAIDs due to increased bleeding risk with anticoagulants Counseled on importance of regular laboratory  monitoring as prescribed. The patient has lab work as requested by providers.  Counseled on seeking medical attention after a head injury or if there is blood in the urine/stool Afib action plan reviewed. The patient goes to [facility] on 09-18-2022 and will see several providers. The patient and his wife were asking questions and the RNCM used EBP tool to educate the patient and his wife on what a 2nd degree heart block, Wenckebach is. The patient has been having sx and sx of dizziness, shortness of breath, and fatigue. He hopes Monday will offer answers to why he is feeling the way he is. He denies any acute distress today. Reflective listening and support given. Will continue to monitor.  Screening for signs and symptoms of depression related to chronic disease state Assessed social determinant of health barriers  Symptom Management: Take medications as prescribed   Attend all scheduled provider appointments Call provider office for new concerns or questions  call the Suicide and Crisis Lifeline: 988 call the [place] National Suicide Prevention Lifeline: 432-866-3330 or TTY: (951)672-6208 TTY 7806724533) to talk to a trained counselor call 1-800-273-TALK (toll free, 24 hour hotline) if experiencing a Mental Health or Behavioral Health Crisis  - make a plan to eat healthy - keep all lab appointments - take medicine as prescribed  Follow Up Plan: Telephone follow up appointment with care management team member scheduled for: 10-26-2022 at 230 pm       RNCM Care Management:  Expected Outcome:  Monitor, Self-Manage and Reduce Symptoms of Diabetes       Current Barriers:  Knowledge Deficits related to monitoring for changes in blood sugars and the effect of elevated blood sugars on other body systems Chronic Disease Management support and education needs related to effective management of DM Lab Results  Component Value Date   HGBA1C 7.0 (H) 05/30/2022     Planned Interventions: Provided  education to patient about basic DM disease process. The patient with slightly elevated A1C level. The patient is mindful of changes. He feels overall he is doing well except for the shortness of breath and low energy level. He is being proactive with appointments and follow up with specialist. Education and support given. ; Reviewed medications with patient and discussed importance of medication adherence. The patient is compliant with medications. Did not tolerate Ozempic. Is currently controlling DM with diet.         Reviewed prescribed diet with patient heart healthy/ADA diet. Patient is mindful of dietary restrictions. The patient denies any issues with dietary restrictions ; Counseled on importance of regular laboratory monitoring as prescribed. Has regular lab testing;        Discussed plans with patient for ongoing care management follow up and provided patient with direct contact information for care management team;      Provided patient with written educational materials related to hypo and hyperglycemia and importance of correct treatment;       Reviewed scheduled/upcoming provider appointments including: 10-02-2022 at 920 am. Reminder given today         Advised patient, providing education and rationale, to check cbg when you have symptoms of low or high blood sugar and as directed   and record. The patient has not been checking his blood sugars.        Referral made to pharmacy team for assistance with medication reconciliation, education and support. Works with pharm D on a consistent basis;       Review of patient status, including review of consultants reports, relevant laboratory and other test results, and medications completed;       Advised patient to discuss changes in DM health and well being with provider;      Screening for signs and symptoms of depression related to chronic disease state;        Assessed social determinant of health barriers;       Eye exam completed today  (02-09-2022). Denies any concerns with vision and states he received a good report.    Symptom Management: Take medications as prescribed   Attend all scheduled provider appointments Call provider office for new concerns or questions  call the Suicide and Crisis Lifeline: 988 call the [place] National Suicide Prevention Lifeline: (519) 540-6359 or TTY: 641-224-8312 TTY 818-518-3369) to talk to a trained counselor call 1-800-273-TALK (toll free, 24 hour hotline) if experiencing a Mental Health or Behavioral Health Crisis  check feet daily for cuts, sores or redness trim toenails straight across manage portion size wash and dry feet carefully every day wear comfortable, cotton socks wear comfortable, well-fitting shoes  Follow Up Plan: Telephone follow up appointment with care management team member scheduled for: 10-26-2022 at 230 pm           Our next appointment is by telephone on 10-26-2022 at 230 pm  Please call the care guide team at [phone] if you need to cancel or reschedule your appointment.   If you are experiencing a Mental Health or Behavioral Health  Crisis or need someone to talk to, please call the Suicide and Crisis Lifeline: 988 call the [place] National Suicide Prevention Lifeline: 320-588-1568 or TTY: 7012012043 TTY (903) 316-1419) to talk to a trained counselor call 1-800-273-TALK (toll free, 24 hour hotline)   Patient verbalizes understanding of instructions and care plan provided today and agrees to view in MyChart. Active MyChart status and patient understanding of how to access instructions and care plan via MyChart confirmed with patient.       Alto Denver RN, MSN, CCM RN Care Manager  [facility]  Ambulatory Care Management  Direct Number: [phone]

## 2022-09-18 ENCOUNTER — Other Ambulatory Visit: Payer: Self-pay | Admitting: Nurse Practitioner

## 2022-09-19 NOTE — Telephone Encounter (Signed)
Requested Prescriptions  Pending Prescriptions Disp Refills   pantoprazole (PROTONIX) 20 MG tablet [Pharmacy Med Name: PANTOPRAZOLE SOD DR 20 MG TAB] 180 tablet 2    Sig: TAKE 1 TABLET (20 MG TOTAL) BY MOUTH 2 (TWO) TIMES DAILY BEFORE A MEAL.     Gastroenterology: Proton Pump Inhibitors Passed - 09/18/2022  1:24 AM      Passed - Valid encounter within last 12 months    Recent Outpatient Visits           2 months ago Tinea cruris   [place] [facility] [place], Elsie T, NP   3 months ago Type 2 diabetes mellitus with obesity (HCC)   [place] [facility] [place], Corrie Dandy T, NP   4 months ago SOB (shortness of breath)   [place] [facility] [place], [name], NP   4 months ago Subacute cough   [place] [facility], [place] T, NP   4 months ago SOB (shortness of breath)   [facility] [place], [name], NP       Future Appointments             In 1 week Marjie Skiff, NP [place] [facility], [facility]   In 5 months Vanna Scotland, MD [facility]

## 2022-09-21 NOTE — Progress Notes (Signed)
Remote pacemaker transmission.   

## 2022-09-23 ENCOUNTER — Other Ambulatory Visit: Payer: Self-pay | Admitting: Nurse Practitioner

## 2022-09-23 DIAGNOSIS — F339 Major depressive disorder, recurrent, unspecified: Secondary | ICD-10-CM

## 2022-09-26 NOTE — Telephone Encounter (Signed)
Requested by interface surescripts. Future visit in 6 days  Requested Prescriptions  Pending Prescriptions Disp Refills   rosuvastatin (CRESTOR) 40 MG tablet [Pharmacy Med Name: ROSUVASTATIN CALCIUM 40 MG TAB] 90 tablet 1    Sig: TAKE 1 TABLET BY MOUTH EVERY DAY     Cardiovascular:  Antilipid - Statins 2 Failed - 09/23/2022 12:38 PM      Failed - Lipid Panel in normal range within the last 12 months    Cholesterol, Total  Date Value Ref Range Status  05/30/2022 133 100 - 199 mg/dL Final   Cholesterol Piccolo, Waived  Date Value Ref Range Status  01/03/2018 198 <200 mg/dL Final    Comment:                            Desirable                <200                         Borderline High      200- 239                         High                     >239    LDL Chol Calc (NIH)  Date Value Ref Range Status  05/30/2022 59 0 - 99 mg/dL Final   HDL  Date Value Ref Range Status  05/30/2022 49 >39 mg/dL Final   Triglycerides  Date Value Ref Range Status  05/30/2022 143 0 - 149 mg/dL Final   Triglycerides Piccolo,Waived  Date Value Ref Range Status  01/03/2018 264 (H) <150 mg/dL Final    Comment:                            Normal                   <150                         Borderline High     150 - 199                         High                200 - 499                         Very High                >499          Passed - Cr in normal range and within 360 days    Creatinine  Date Value Ref Range Status  02/26/2014 0.94 0.60 - 1.30 mg/dL Final   Creatinine, Ser  Date Value Ref Range Status  05/30/2022 1.11 0.76 - 1.27 mg/dL Final         Passed - Patient is not pregnant      Passed - Valid encounter within last 12 months    Recent Outpatient Visits           2 months ago Tinea cruris   [place] [facility] [place], [place] T, NP   3 months ago Type  2 diabetes mellitus with obesity (HCC)   [facility] [place], [place] T,  NP   4 months ago SOB (shortness of breath)   [place] [facility] [place] [facility], [name], NP   4 months ago Subacute cough   [facility] [place], [place] T, NP   5 months ago SOB (shortness of breath)   [place] [facility] [place], [name], NP       Future Appointments             In 6 days Marjie Skiff, NP  [facility]   In 5 months Vanna Scotland, MD [facility] [place]             venlafaxine XR (EFFEXOR-XR) 75 MG 24 hr capsule [Pharmacy Med Name: VENLAFAXINE HCL ER 75 MG CAP] 270 capsule 0    Sig: TAKE 3 CAPSULES BY MOUTH EVERY DAY     Psychiatry: Antidepressants - SNRI - desvenlafaxine & venlafaxine Failed - 09/23/2022 12:38 PM      Failed - Lipid Panel in normal range within the last 12 months    Cholesterol, Total  Date Value Ref Range Status  05/30/2022 133 100 - 199 mg/dL Final   Cholesterol Piccolo, Waived  Date Value Ref Range Status  01/03/2018 198 <200 mg/dL Final    Comment:                            Desirable                <200                         Borderline High      200- 239                         High                     >239    LDL Chol Calc (NIH)  Date Value Ref Range Status  05/30/2022 59 0 - 99 mg/dL Final   HDL  Date Value Ref Range Status  05/30/2022 49 >39 mg/dL Final   Triglycerides  Date Value Ref Range Status  05/30/2022 143 0 - 149 mg/dL Final   Triglycerides Piccolo,Waived  Date Value Ref Range Status  01/03/2018 264 (H) <150 mg/dL Final    Comment:                            Normal                   <150                         Borderline High     150 - 199                         High                200 - 499                         Very High                >499  Passed - Cr in normal range and within 360 days    Creatinine  Date Value Ref Range Status  02/26/2014 0.94 0.60 - 1.30 mg/dL Final    Creatinine, Ser  Date Value Ref Range Status  05/30/2022 1.11 0.76 - 1.27 mg/dL Final         Passed - Completed PHQ-2 or PHQ-9 in the last 360 days      Passed - Last BP in normal range    BP Readings from Last 1 Encounters:  07/17/22 127/83         Passed - Valid encounter within last 6 months    Recent Outpatient Visits           2 months ago Tinea cruris   [facility] [place], [place] T, NP   3 months ago Type 2 diabetes mellitus with obesity (HCC)   [facility] [place], Corrie Dandy T, NP   4 months ago SOB (shortness of breath)   [place] [place], [name], NP   4 months ago Subacute cough   [facility] [place], [place] T, NP   5 months ago SOB (shortness of breath)   [place] [facility] [place], [name], NP       Future Appointments             In 6 days Marjie Skiff, NP [facility]   In 5 months Vanna Scotland, MD [facility] [place]             gabapentin (NEURONTIN) 600 MG tablet [Pharmacy Med Name: GABAPENTIN 600 MG TABLET] 90 tablet 4    Sig: TAKE 1 TABLET BY MOUTH EVERYDAY AT BEDTIME     Neurology: Anticonvulsants - gabapentin Passed - 09/23/2022 12:38 PM      Passed - Cr in normal range and within 360 days    Creatinine  Date Value Ref Range Status  02/26/2014 0.94 0.60 - 1.30 mg/dL Final   Creatinine, Ser  Date Value Ref Range Status  05/30/2022 1.11 0.76 - 1.27 mg/dL Final         Passed - Completed PHQ-2 or PHQ-9 in the last 360 days      Passed - Valid encounter within last 12 months    Recent Outpatient Visits           2 months ago Tinea cruris   [facility] [place], [place] T, NP   3 months ago Type 2 diabetes mellitus with obesity (HCC)   [place] [facility] [place], Hector T, NP   4 months ago SOB (shortness of breath)   [facility] [place], [name], NP   4 months ago Subacute cough   [facility] [place], [place] T, NP   5 months ago SOB (shortness of breath)   [place] [facility] [place], [name], NP       Future Appointments             In 6 days Marjie Skiff, NP  [facility]   In 5 months Vanna Scotland, MD [facility]

## 2022-09-26 NOTE — Telephone Encounter (Signed)
Requested medication (s) are due for refill today: expired medication  Requested medication (s) are on the active medication list: yes   Last refill:  07/21/21 #90 4 refills  Future visit scheduled: yes in 6 days   Notes to clinic:  expired medication. Do you want to renew Rx?     Requested Prescriptions  Pending Prescriptions Disp Refills   gabapentin (NEURONTIN) 600 MG tablet [Pharmacy Med Name: GABAPENTIN 600 MG TABLET] 90 tablet 4    Sig: TAKE 1 TABLET BY MOUTH EVERYDAY AT BEDTIME     Neurology: Anticonvulsants - gabapentin Passed - 09/23/2022 12:38 PM      Passed - Cr in normal range and within 360 days    Creatinine  Date Value Ref Range Status  02/26/2014 0.94 0.60 - 1.30 mg/dL Final   Creatinine, Ser  Date Value Ref Range Status  05/30/2022 1.11 0.76 - 1.27 mg/dL Final         Passed - Completed PHQ-2 or PHQ-9 in the last 360 days      Passed - Valid encounter within last 12 months    Recent Outpatient Visits           2 months ago Tinea cruris   [place] [facility] [place], [place] T, NP   3 months ago Type 2 diabetes mellitus with obesity (HCC)   [place] [facility] [place], [place] T, NP   4 months ago SOB (shortness of breath)   [place] [facility] [place], Corrie Dandy T, NP   4 months ago Subacute cough   [place] [facility] [place], [place] T, NP   5 months ago SOB (shortness of breath)   [place] [facility] [place], [name], NP       Future Appointments             In 6 days Marjie Skiff, NP  [facility]   In 5 months Vanna Scotland, MD [facility] [place]            Signed Prescriptions Disp Refills   rosuvastatin (CRESTOR) 40 MG tablet 90 tablet 1    Sig: TAKE 1 TABLET BY MOUTH EVERY DAY     Cardiovascular:  Antilipid - Statins 2 Failed - 09/23/2022 12:38 PM      Failed - Lipid Panel in normal range within the last  12 months    Cholesterol, Total  Date Value Ref Range Status  05/30/2022 133 100 - 199 mg/dL Final   Cholesterol Piccolo, Waived  Date Value Ref Range Status  01/03/2018 198 <200 mg/dL Final    Comment:                            Desirable                <200                         Borderline High      200- 239                         High                     >239    LDL Chol Calc (NIH)  Date Value Ref Range Status  05/30/2022 59 0 - 99 mg/dL Final  HDL  Date Value Ref Range Status  05/30/2022 49 >39 mg/dL Final   Triglycerides  Date Value Ref Range Status  05/30/2022 143 0 - 149 mg/dL Final   Triglycerides Piccolo,Waived  Date Value Ref Range Status  01/03/2018 264 (H) <150 mg/dL Final    Comment:                            Normal                   <150                         Borderline High     150 - 199                         High                200 - 499                         Very High                >499          Passed - Cr in normal range and within 360 days    Creatinine  Date Value Ref Range Status  02/26/2014 0.94 0.60 - 1.30 mg/dL Final   Creatinine, Ser  Date Value Ref Range Status  05/30/2022 1.11 0.76 - 1.27 mg/dL Final         Passed - Patient is not pregnant      Passed - Valid encounter within last 12 months    Recent Outpatient Visits           2 months ago Tinea cruris   [place] [facility] [place], [place] T, NP   3 months ago Type 2 diabetes mellitus with obesity (HCC)   [place] [facility] [place], [place] T, NP   4 months ago SOB (shortness of breath)   [place] [facility] [place], Corrie Dandy T, NP   4 months ago Subacute cough   [place] [facility] [place], [place] T, NP   5 months ago SOB (shortness of breath)   [place] [facility] [place], [name], NP       Future Appointments             In 6 days [place], Corrie Dandy T, NP [place]  [facility]   In 5 months Vanna Scotland, MD [facility] [place]             venlafaxine XR (EFFEXOR-XR) 75 MG 24 hr capsule 270 capsule 0    Sig: TAKE 3 CAPSULES BY MOUTH EVERY DAY     Psychiatry: Antidepressants - SNRI - desvenlafaxine & venlafaxine Failed - 09/23/2022 12:38 PM      Failed - Lipid Panel in normal range within the last 12 months    Cholesterol, Total  Date Value Ref Range Status  05/30/2022 133 100 - 199 mg/dL Final   Cholesterol Piccolo, Waived  Date Value Ref Range Status  01/03/2018 198 <200 mg/dL Final    Comment:                            Desirable                <  200                         Borderline High      200- 239                         High                     >239    LDL Chol Calc (NIH)  Date Value Ref Range Status  05/30/2022 59 0 - 99 mg/dL Final   HDL  Date Value Ref Range Status  05/30/2022 49 >39 mg/dL Final   Triglycerides  Date Value Ref Range Status  05/30/2022 143 0 - 149 mg/dL Final   Triglycerides Piccolo,Waived  Date Value Ref Range Status  01/03/2018 264 (H) <150 mg/dL Final    Comment:                            Normal                   <150                         Borderline High     150 - 199                         High                200 - 499                         Very High                >499          Passed - Cr in normal range and within 360 days    Creatinine  Date Value Ref Range Status  02/26/2014 0.94 0.60 - 1.30 mg/dL Final   Creatinine, Ser  Date Value Ref Range Status  05/30/2022 1.11 0.76 - 1.27 mg/dL Final         Passed - Completed PHQ-2 or PHQ-9 in the last 360 days      Passed - Last BP in normal range    BP Readings from Last 1 Encounters:  07/17/22 127/83         Passed - Valid encounter within last 6 months    Recent Outpatient Visits           2 months ago Tinea cruris   [facility] [place], [place] T, NP   3 months  ago Type 2 diabetes mellitus with obesity (HCC)   [place] [facility] [place], [place] T, NP   4 months ago SOB (shortness of breath)   [place] [facility] [place], [name], NP   4 months ago Subacute cough   [facility] [place], [place] T, NP   5 months ago SOB (shortness of breath)   [place] [place], [name], NP       Future Appointments             In 6 days Marjie Skiff, NP [facility]   In 5 months Vanna Scotland, MD [facility]

## 2022-09-29 ENCOUNTER — Other Ambulatory Visit: Payer: Medicare Other

## 2022-09-29 NOTE — Progress Notes (Signed)
   09/29/2022  Patient ID: [name], male   DOB: June 28, 1945, 76 y.o.   MRN: [id number]  Telephone visit to assist with affordability of Eliquis and Jardiance.  -Prescriptions were previously affordable to the patient, but he is now in the donut hole and Eliquis is >$400 every 3 months.  Jardiance likely to be similar since there is no generic. -Based on [facility], patient does not qualify for PAP for either medication -Wife endorses they have ordered Eliquis from [place] for nearly half of the cost their copay was going to be.  These medications do not necessarily meet the same regulations as those filled in the [place], so I do recommend close follow-up in regard to A-fib -They are not sure what the [place] will cost to refill, but they will reach out if this is not going to be feasible; so an alternative can be prescribed until the first of the year at least  Lenna Gilford, PharmD, DPLA

## 2022-09-30 DIAGNOSIS — I2724 Chronic thromboembolic pulmonary hypertension: Secondary | ICD-10-CM | POA: Insufficient documentation

## 2022-09-30 NOTE — Patient Instructions (Incomplete)

## 2022-10-02 ENCOUNTER — Encounter: Payer: Self-pay | Admitting: Nurse Practitioner

## 2022-10-02 ENCOUNTER — Ambulatory Visit (INDEPENDENT_AMBULATORY_CARE_PROVIDER_SITE_OTHER): Payer: Medicare Other | Admitting: Nurse Practitioner

## 2022-10-02 VITALS — BP 131/71 | HR 85 | Temp 98.0°F | Ht 70.0 in | Wt 284.0 lb

## 2022-10-02 DIAGNOSIS — N1831 Chronic kidney disease, stage 3a: Secondary | ICD-10-CM

## 2022-10-02 DIAGNOSIS — Z23 Encounter for immunization: Secondary | ICD-10-CM

## 2022-10-02 DIAGNOSIS — F339 Major depressive disorder, recurrent, unspecified: Secondary | ICD-10-CM

## 2022-10-02 DIAGNOSIS — G894 Chronic pain syndrome: Secondary | ICD-10-CM

## 2022-10-02 DIAGNOSIS — I2724 Chronic thromboembolic pulmonary hypertension: Secondary | ICD-10-CM

## 2022-10-02 DIAGNOSIS — L03818 Cellulitis of other sites: Secondary | ICD-10-CM

## 2022-10-02 DIAGNOSIS — E1159 Type 2 diabetes mellitus with other circulatory complications: Secondary | ICD-10-CM

## 2022-10-02 DIAGNOSIS — E669 Obesity, unspecified: Secondary | ICD-10-CM

## 2022-10-02 DIAGNOSIS — I480 Paroxysmal atrial fibrillation: Secondary | ICD-10-CM | POA: Diagnosis not present

## 2022-10-02 DIAGNOSIS — Z79899 Other long term (current) drug therapy: Secondary | ICD-10-CM

## 2022-10-02 DIAGNOSIS — E1169 Type 2 diabetes mellitus with other specified complication: Secondary | ICD-10-CM | POA: Diagnosis not present

## 2022-10-02 DIAGNOSIS — F419 Anxiety disorder, unspecified: Secondary | ICD-10-CM

## 2022-10-02 LAB — BAYER DCA HB A1C WAIVED: HB A1C (BAYER DCA - WAIVED): 7 % — ABNORMAL HIGH (ref 4.8–5.6)

## 2022-10-02 MED ORDER — NYSTATIN 100000 UNIT/GM EX CREA: 1.0000 | TOPICAL_CREAM | Freq: Two times a day (BID) | CUTANEOUS | 4 refills | Status: DC

## 2022-10-02 MED ORDER — MUPIROCIN 2 % EX OINT: 1.0000 | TOPICAL_OINTMENT | Freq: Two times a day (BID) | CUTANEOUS | 0 refills | Status: DC

## 2022-10-02 MED ORDER — LORAZEPAM 1 MG PO TABS
ORAL_TABLET | ORAL | 0 refills | Status: DC
Start: 2022-10-24 — End: 2023-01-01

## 2022-10-02 MED ORDER — DOXYCYCLINE HYCLATE 100 MG PO TABS: 100.0000 mg | ORAL_TABLET | Freq: Two times a day (BID) | ORAL | 0 refills | Status: AC

## 2022-10-02 NOTE — Assessment & Plan Note (Signed)
Chronic, stable.  BP at goal today.  Followed by cardiology.  With orthostatic BP presenting occasionally.  Continue current regimen at this time and have recommended utilizing compression hose at home, agrees to try this.  Continue collaboration with cardiology + neurology and current medication regimen.  Labs: BMP.  Recommend ensuring good fluid intake at home.

## 2022-10-02 NOTE — Assessment & Plan Note (Signed)
Chronic, ongoing.  Continue current medication regimen and adjust as needed. Lipid panel today. 

## 2022-10-02 NOTE — Assessment & Plan Note (Signed)
Diagnosed on 02/21/21, currently taking Jardiance with tolerance.  A1c 7% today, no change.  Glucometer supplies present, recommend he check sugars at least daily fasting.  Labs today.  Recommend cutting back on snacking during day and minimize sweets.  Return in 3 months. - Statin on board.  No ACE or ARB due to hypotension episodes. - Vaccinations up to date. - Eye and foot exams up to date.

## 2022-10-02 NOTE — Assessment & Plan Note (Signed)
Chronic, ongoing.  Denies SI/HI.  Continue current medication regimen and adjust as needed.  Would benefit from trial off Effexor and trial of SSRI, but refuses this.   

## 2022-10-02 NOTE — Assessment & Plan Note (Signed)
Diagnosed at [facility] August 2024.  Will continue collaboration with pulmonary, recent notes and tests reviewed.  Recommend they go to website for [facility] that was recommended by pulmonary.

## 2022-10-02 NOTE — Assessment & Plan Note (Signed)
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  Recommended eating smaller high protein, low fat meals more frequently and exercising 30 mins a day 5 times a week with a goal of 10-15lb weight loss in the next 3 months. Patient voiced their understanding and motivation to adhere to these recommendations.

## 2022-10-02 NOTE — Assessment & Plan Note (Signed)
Has been on Ativan for many years, with trial of cutting back unsuccessful.  At length discussions about risk of opioid and benzo use had with patient and wife by both PCP and CCM PharmD.  Continue these discussions.  Refer to anxiety plan. 

## 2022-10-02 NOTE — Assessment & Plan Note (Signed)
Ongoing stage 3a, recheck BMP today and adjust medications as needed.  His recent labs since July 2022 have been within good ranges and improved.

## 2022-10-02 NOTE — Assessment & Plan Note (Signed)
Chronic, stable, followed by cardiology.  Rate control present.  Continue current medication regimen as prescribed by them.  Labs: BMP.  Ablation last on 05/17/20.

## 2022-10-02 NOTE — Assessment & Plan Note (Signed)
Chronic, ongoing followed by pain clinic, Dr. Andree Elk.  Discussed at length risk of benzo and opioid use in conjunction with each other.  Recommend he not take the two together at same hour during day or evening.

## 2022-10-02 NOTE — Assessment & Plan Note (Signed)
Chronic, ongoing.  Discussed at length risk of benzo and opioid use in conjunction with each other.  Recommend he not take the two together at same hour during day or evening. Recommend now that has CPAP and improved sleep trial cut back slowly on Ativan.  They request 90 day supply, as previous PCP supplied this.  Are aware he does have to return every 3 months for refills.  UDS up to date with pain management.  Refills sent.

## 2022-10-02 NOTE — Progress Notes (Signed)
BP 131/71   Pulse 85   Temp 98 F (36.7 C) (Oral)   Ht 5\' 10"  (1.778 m)   Wt 284 lb (128.8 kg)   SpO2 97%   BMI 40.75 kg/m    Subjective:    Patient ID: Nicholas [name], male    DOB: 1945/03/14, 77 y.o.   MRN: [id number]  HPI: Nicholas [name] is a 77 y.o. male  Chief Complaint  Patient presents with   Depression   Diabetes   Hyperlipidemia   Hypertension   Has irritation to left exterior ear after scratching area and would like this looked at.  DIABETES A1c in May was 7%.  Taking Jardiance 25 MG daily for heart and diabetes. Ozempic caused GI symptoms, including increased heart burn.   Hypoglycemic episodes:no Polydipsia/polyuria: no Visual disturbance: no Chest pain: no Paresthesias: no Glucose Monitoring: yes  Accucheck frequency: not checking  Fasting glucose:   Post prandial:  Evening:  Before meals: Taking Insulin?: no  Long acting insulin:  Short acting insulin: Blood Pressure Monitoring: daily Retinal Examination: Up To Date -- Dr. Leonides Cave daughter, macular degeneration noted Foot Exam: Up to Date Pneumovax: Up to Date Influenza: Up to Date Aspirin: no   HYPERTENSION / HYPERLIPIDEMIA/A-FIB Saw cardiology 04/28/22 -- returns 11/14/22.  Continues on Rosuvastatin, Eliquis, fish oil.  Last ablation was 05/17/20.  Echo 04/06/22 EF 60-65% and mild LVH.  Last pacemaker check 09/11/22 -- was placed January 2018. OSA present.  Diagnosed June 2022 -- is using CPAP 100% of the time and finds benefit with sleep pattern and this.    Currently being followed by pulmonary for his SOB and diagnosis of CTEPH at present, continues to have OB.  Last visit was 09/18/22 -- they want him to go to heart track and talk to cardiology about pacemaker, they feel it is too slow. Satisfied with current treatment? yes Duration of hypertension: chronic BP monitoring frequency: occasional BP range:  BP medication side effects: no Duration of hyperlipidemia: chronic Cholesterol medication  side effects: no Cholesterol supplements: fish oil Medication compliance: good compliance Aspirin: no Recent stressors: no Recurrent headaches: no Visual changes: no Palpitations: no Dyspnea: if exerting self a lot, even with showering -- ongoing issue Chest pain: no Lower extremity edema: no Dizzy/lightheaded: occasional  CHRONIC KIDNEY DISEASE Recent labs remain stable -- CKD 3a. CKD status: stable Medications renally dose: yes Previous renal evaluation: no Pneumovax:  Up to Date Influenza Vaccine:  Up to Date    Latest Ref Rng & Units 05/30/2022    8:33 AM 04/28/2022   12:45 PM 04/26/2022   11:49 AM  BMP  Glucose 70 - 99 mg/dL 595  638  756   BUN 8 - 27 mg/dL 15  29  23    Creatinine 0.76 - 1.27 mg/dL 4.33  2.95  1.88   BUN/Creat Ratio 10 - 24 14   19    Sodium 134 - 144 mmol/L 139  138  140   Potassium 3.5 - 5.2 mmol/L 4.6  3.9  4.3   Chloride 96 - 106 mmol/L 101  95  99   CO2 20 - 29 mmol/L 21  31  24    Calcium 8.6 - 10.2 mg/dL 9.5  9.0  9.8     DEPRESSION & CHRONIC PAIN Taking Effexor, Seroquel, and Ativan.  Pt and his wife at bedside made aware of risks of benzo medication use to include increased sedation, respiratory suppression, falls, extrapyramidal movements, dependence and cardiovascular events.  Pt  and his wife would like to continue treatment as benefit determined to outweigh risk.  Multiple at length discussions with him that he is also on opioid therapy.  Discussed risks with taking these three medications together at same time and recommend to separate them when taking Seroquel, Ativan, and opioid.  Is a Thomasene Ripple, has been on this regimen for years.   Has tried in past taking 1/2 tablet Ativan, but does not work well (per wife and patient) and wishes to maintain current dosing.  Last Ativan fill on PDMP review 07/24/22  and last opioid fill 09/14/22.  Last UDS with pain management on 10/19/21. Saw Dr. Pernell Dupre on 08/01/22. Duration:stable Anxious mood: yes,  occasional Excessive worrying: yes Irritability: no Sweating: no Nausea: no Palpitations:no Hyperventilation: no Panic attacks: no Agoraphobia: no  Obscessions/compulsions: no Depressed mood: sometimes    10/02/2022    9:09 AM 06/06/2022    9:05 AM 04/26/2022   11:32 AM 12/05/2021    1:06 PM 11/29/2021    8:53 AM  Depression screen PHQ 2/9  Decreased Interest 1 1 2  0 0  Down, Depressed, Hopeless 1 1 2  0 0  PHQ - 2 Score 2 2 4  0 0  Altered sleeping 0 1 3  0  Tired, decreased energy 3 1 3   0  Change in appetite 0 0 0  0  Feeling bad or failure about yourself  0 0 2  0  Trouble concentrating 0 0 0  0  Moving slowly or fidgety/restless 0 0 1  0  Suicidal thoughts 0 0 0  0  PHQ-9 Score 5 4 13   0  Difficult doing work/chores Somewhat difficult    Not difficult at all       10/02/2022    9:09 AM 11/29/2021    8:53 AM 08/24/2021    8:38 AM 05/23/2021    9:05 AM  GAD 7 : Generalized Anxiety Score  Nervous, Anxious, on Edge 0 1 3 0  Control/stop worrying 3 2 3 3   Worry too much - different things 3 1 3 3   Trouble relaxing 0 1 3 1   Restless 0 1 3 0  Easily annoyed or irritable 0 1 3 2   Afraid - awful might happen 2 1 3 2   Total GAD 7 Score 8 8 21 11   Anxiety Difficulty Somewhat difficult Not difficult at all Extremely difficult Somewhat difficult   Relevant past medical, surgical, family and social history reviewed and updated as indicated. Interim medical history since our last visit reviewed. Allergies and medications reviewed and updated.  Review of Systems  Constitutional:  Positive for fatigue. Negative for activity change, appetite change and fever.  Respiratory:  Positive for shortness of breath. Negative for cough, chest tightness and wheezing.   Cardiovascular:  Negative for chest pain, palpitations and leg swelling.  Endocrine: Negative for polydipsia, polyphagia and polyuria.  Musculoskeletal:  Positive for arthralgias.  Neurological: Negative.   Psychiatric/Behavioral:   Negative for decreased concentration, self-injury, sleep disturbance and suicidal ideas. The patient is not nervous/anxious.     Per HPI unless specifically indicated above     Objective:    BP 131/71   Pulse 85   Temp 98 F (36.7 C) (Oral)   Ht 5\' 10"  (1.778 m)   Wt 284 lb (128.8 kg)   SpO2 97%   BMI 40.75 kg/m   Wt Readings from Last 3 Encounters:  10/02/22 284 lb (128.8 kg)  07/17/22 281 lb 3.2 oz (127.6 kg)  06/06/22 285 lb (129.3 kg)    Physical Exam Vitals and nursing note reviewed.  Constitutional:      General: He is awake. He is not in acute distress.    Appearance: He is well-developed and well-groomed. He is obese. He is not ill-appearing or toxic-appearing.  HENT:     Head: Normocephalic and atraumatic.     Right Ear: Hearing and external ear normal. No drainage.     Left Ear: Hearing and external ear normal. No drainage.     Ears:   Eyes:     General: Lids are normal.        Right eye: No discharge.        Left eye: No discharge.     Extraocular Movements: Extraocular movements intact.     Conjunctiva/sclera: Conjunctivae normal.     Pupils: Pupils are equal, round, and reactive to light.  Neck:     Thyroid: No thyromegaly.     Vascular: No carotid bruit.  Cardiovascular:     Rate and Rhythm: Normal rate and regular rhythm.     Heart sounds: Normal heart sounds, S1 normal and S2 normal. No murmur heard.    No gallop.  Pulmonary:     Effort: Pulmonary effort is normal. No accessory muscle usage or respiratory distress.     Breath sounds: Normal breath sounds.  Abdominal:     General: Bowel sounds are normal. There is no distension.     Palpations: Abdomen is soft.     Tenderness: There is no abdominal tenderness.  Musculoskeletal:        General: Normal range of motion.     Cervical back: Normal range of motion and neck supple.     Right lower leg: No edema.     Left lower leg: No edema.  Lymphadenopathy:     Cervical: No cervical adenopathy.   Skin:    General: Skin is warm and dry.  Neurological:     Mental Status: He is alert and oriented to person, place, and time.     Deep Tendon Reflexes: Reflexes are normal and symmetric.     Reflex Scores:      Brachioradialis reflexes are 2+ on the right side and 2+ on the left side.      Patellar reflexes are 2+ on the right side and 2+ on the left side. Psychiatric:        Attention and Perception: Attention normal.        Mood and Affect: Mood normal.        Behavior: Behavior normal. Behavior is cooperative.        Thought Content: Thought content normal.    Results for orders placed or performed in visit on 10/02/22  Bayer DCA Hb A1c Waived  Result Value Ref Range   HB A1C (BAYER DCA - WAIVED) 7.0 (H) 4.8 - 5.6 %      Assessment & Plan:   Problem List Items Addressed This Visit       Cardiovascular and Mediastinum   PAF (paroxysmal atrial fibrillation) (HCC) (Chronic)    Chronic, stable, followed by cardiology.  Rate control present.  Continue current medication regimen as prescribed by them.  Labs: BMP.  Ablation last on 05/17/20.        Relevant Orders   Basic metabolic panel   Lipid Panel w/o Chol/HDL Ratio   CTEPH (chronic thromboembolic pulmonary hypertension) (HCC)    Diagnosed at [place] August 2024.  Will continue collaboration with pulmonary,  recent notes and tests reviewed.  Recommend they go to website for [facility] that was recommended by pulmonary.      Hypertension associated with diabetes (HCC)    Chronic, stable.  BP at goal today.  Followed by cardiology.  With orthostatic BP presenting occasionally.  Continue current regimen at this time and have recommended utilizing compression hose at home, agrees to try this.  Continue collaboration with cardiology + neurology and current medication regimen.  Labs: BMP.  Recommend ensuring good fluid intake at home.        Relevant Orders   Bayer DCA Hb A1c Waived (Completed)   Basic metabolic panel     Endocrine    Hyperlipidemia associated with type 2 diabetes mellitus (HCC)    Chronic, ongoing.  Continue current medication regimen and adjust as needed. Lipid panel today.      Relevant Orders   Bayer DCA Hb A1c Waived (Completed)   Lipid Panel w/o Chol/HDL Ratio   Type 2 diabetes mellitus with obesity (HCC) - Primary    Diagnosed on 02/21/21, currently taking Jardiance with tolerance.  A1c 7% today, no change.  Glucometer supplies present, recommend he check sugars at least daily fasting.  Labs today.  Recommend cutting back on snacking during day and minimize sweets.  Return in 3 months. - Statin on board.  No ACE or ARB due to hypotension episodes. - Vaccinations up to date. - Eye and foot exams up to date.      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  His recent labs since July 2022 have been within good ranges and improved.        Relevant Orders   Basic metabolic panel     Other   Anxiety (Chronic)    Chronic, ongoing.  Discussed at length risk of benzo and opioid use in conjunction with each other.  Recommend he not take the two together at same hour during day or evening. Recommend now that has CPAP and improved sleep trial cut back slowly on Ativan.  They request 90 day supply, as previous PCP supplied this.  Are aware he does have to return every 3 months for refills.  UDS up to date with pain management.  Refills sent.        Relevant Medications   LORazepam (ATIVAN) 1 MG tablet (Start on 10/24/2022)   Chronic pain syndrome (Chronic)    Chronic, ongoing followed by pain clinic, Dr. Pernell Dupre.  Discussed at length risk of benzo and opioid use in conjunction with each other.  Recommend he not take the two together at same hour during day or evening.        Depression, recurrent (HCC) (Chronic)    Chronic, ongoing.  Denies SI/HI.   Continue current medication regimen and adjust as needed.  Would benefit from trial off Effexor and trial of SSRI, but refuses this.        Relevant Medications   LORazepam (ATIVAN) 1 MG tablet (Start on 10/24/2022)   Long-term current use of benzodiazepine (Chronic)    Has been on Ativan for many years, with trial of cutting back unsuccessful.  At length discussions about risk of opioid and benzo use had with patient and wife by both PCP and CCM PharmD.  Continue these discussions.  Refer to anxiety plan.  Morbid obesity (HCC) (Chronic)    BMI 40.75 with HTN, A-Fib, Heart Block, and CKD.  Recommended eating smaller high protein, low fat meals more frequently and exercising 30 mins a day 5 times a week with a goal of 10-15lb weight loss in the next 3 months. Patient voiced their understanding and motivation to adhere to these recommendations.       Other Visit Diagnoses     Cellulitis of other specified site       To outside left ear.  Sent in Doxycycline and Mupirocin -- return if worsening or ongoing symptoms.   Flu vaccine need       Flu vaccine in office today, educated patient.        Follow up plan: Return in about 3 months (around 01/01/2023) for T2DM, HTN/HLD, PULMONARY HTN.

## 2022-10-03 LAB — LIPID PANEL W/O CHOL/HDL RATIO
Cholesterol, Total: 191 mg/dL (ref 100–199)
HDL: 42 mg/dL (ref >39)
LDL Chol Calc (NIH): 117 mg/dL — ABNORMAL HIGH (ref 0–99)
Triglycerides: 183 mg/dL — ABNORMAL HIGH (ref 0–149)
VLDL Cholesterol Cal: 32 mg/dL (ref 5–40)

## 2022-10-03 LAB — BASIC METABOLIC PANEL
BUN/Creatinine Ratio: 15 (ref 10–24)
BUN: 17 mg/dL (ref 8–27)
CO2: 24 mmol/L (ref 20–29)
Calcium: 9.3 mg/dL (ref 8.6–10.2)
Chloride: 101 mmol/L (ref 96–106)
Creatinine, Ser: 1.16 mg/dL (ref 0.76–1.27)
Glucose: 177 mg/dL — ABNORMAL HIGH (ref 70–99)
Potassium: 4.1 mmol/L (ref 3.5–5.2)
Sodium: 140 mmol/L (ref 134–144)
eGFR: 65 mL/min/{1.73_m2} (ref >59)

## 2022-10-03 NOTE — Progress Notes (Signed)
Contacted via MyChart   Good afternoon Nicholas [name], your labs have returned: - Kidney function, creatinine and eGFR, remains normal.  Glucose is quite elevated though, but A1c remains 7%.  Continue current medication regimen. - Cholesterol levels trended up this check, but had been at goal.  Have you been taking Rosuvastatin?  Let me know ASAP and let me know if you need refills on this.  Thank you!! Keep being amazing!!  Thank you for allowing me to participate in your care.  I appreciate you. Kindest regards, Brettney Ficken

## 2022-10-09 ENCOUNTER — Other Ambulatory Visit: Payer: Self-pay

## 2022-10-09 ENCOUNTER — Encounter: Payer: Medicare Other | Attending: Internal Medicine

## 2022-10-09 DIAGNOSIS — I2724 Chronic thromboembolic pulmonary hypertension: Secondary | ICD-10-CM | POA: Insufficient documentation

## 2022-10-09 DIAGNOSIS — R0609 Other forms of dyspnea: Secondary | ICD-10-CM | POA: Insufficient documentation

## 2022-10-09 NOTE — Progress Notes (Signed)
Virtual Visit completed. Patient informed on EP and RD appointment and 6 Minute walk test. Patient also informed of patient health questionnaires on My Chart. Patient Verbalizes understanding. Visit diagnosis can be found in [facility] 09/18/2022.

## 2022-10-16 ENCOUNTER — Other Ambulatory Visit: Payer: Self-pay | Admitting: Nurse Practitioner

## 2022-10-16 DIAGNOSIS — F339 Major depressive disorder, recurrent, unspecified: Secondary | ICD-10-CM

## 2022-10-17 ENCOUNTER — Encounter: Payer: Self-pay | Admitting: Anesthesiology

## 2022-10-17 ENCOUNTER — Ambulatory Visit: Payer: Medicare Other | Attending: Anesthesiology | Admitting: Anesthesiology

## 2022-10-17 DIAGNOSIS — M5416 Radiculopathy, lumbar region: Secondary | ICD-10-CM

## 2022-10-17 DIAGNOSIS — G894 Chronic pain syndrome: Secondary | ICD-10-CM | POA: Diagnosis not present

## 2022-10-17 DIAGNOSIS — M51369 Other intervertebral disc degeneration, lumbar region without mention of lumbar back pain or lower extremity pain: Secondary | ICD-10-CM

## 2022-10-17 DIAGNOSIS — M79641 Pain in right hand: Secondary | ICD-10-CM

## 2022-10-17 DIAGNOSIS — M79642 Pain in left hand: Secondary | ICD-10-CM

## 2022-10-17 DIAGNOSIS — M5136 Other intervertebral disc degeneration, lumbar region: Secondary | ICD-10-CM | POA: Diagnosis not present

## 2022-10-17 DIAGNOSIS — M5431 Sciatica, right side: Secondary | ICD-10-CM

## 2022-10-17 DIAGNOSIS — M47816 Spondylosis without myelopathy or radiculopathy, lumbar region: Secondary | ICD-10-CM

## 2022-10-17 DIAGNOSIS — M5432 Sciatica, left side: Secondary | ICD-10-CM

## 2022-10-17 DIAGNOSIS — M1611 Unilateral primary osteoarthritis, right hip: Secondary | ICD-10-CM

## 2022-10-17 DIAGNOSIS — F1190 Opioid use, unspecified, uncomplicated: Secondary | ICD-10-CM

## 2022-10-17 MED ORDER — HYDROCODONE-ACETAMINOPHEN 5-325 MG PO TABS
1.0000 | ORAL_TABLET | Freq: Two times a day (BID) | ORAL | 0 refills | Status: DC
Start: 2022-10-17 — End: 2022-12-30

## 2022-10-17 MED ORDER — HYDROCODONE-ACETAMINOPHEN 5-325 MG PO TABS: 1.0000 | ORAL_TABLET | Freq: Two times a day (BID) | ORAL | 0 refills | Status: DC

## 2022-10-17 NOTE — Telephone Encounter (Signed)
Pharmacy did not receive refill 09/26/22. Requested Prescriptions  Pending Prescriptions Disp Refills   venlafaxine XR (EFFEXOR-XR) 75 MG 24 hr capsule [Pharmacy Med Name: VENLAFAXINE HCL ER 75 MG CAP] 270 capsule 0    Sig: TAKE 3 CAPSULES BY MOUTH EVERY DAY     Psychiatry: Antidepressants - SNRI - desvenlafaxine & venlafaxine Failed - 10/16/2022 10:17 AM      Failed - Lipid Panel in normal range within the last 12 months    Cholesterol, Total  Date Value Ref Range Status  10/02/2022 191 100 - 199 mg/dL Final   Cholesterol Piccolo, Waived  Date Value Ref Range Status  01/03/2018 198 <200 mg/dL Final    Comment:                            Desirable                <200                         Borderline High      200- 239                         High                     >239    LDL Chol Calc (NIH)  Date Value Ref Range Status  10/02/2022 117 (H) 0 - 99 mg/dL Final   HDL  Date Value Ref Range Status  10/02/2022 42 >39 mg/dL Final   Triglycerides  Date Value Ref Range Status  10/02/2022 183 (H) 0 - 149 mg/dL Final   Triglycerides Piccolo,Waived  Date Value Ref Range Status  01/03/2018 264 (H) <150 mg/dL Final    Comment:                            Normal                   <150                         Borderline High     150 - 199                         High                200 - 499                         Very High                >499          Passed - Cr in normal range and within 360 days    Creatinine  Date Value Ref Range Status  02/26/2014 0.94 0.60 - 1.30 mg/dL Final   Creatinine, Ser  Date Value Ref Range Status  10/02/2022 1.16 0.76 - 1.27 mg/dL Final         Passed - Completed PHQ-2 or PHQ-9 in the last 360 days      Passed - Last BP in normal range    BP Readings from Last 1 Encounters:  10/02/22 131/71         Passed - Valid encounter within last 6 months  Recent Outpatient Visits           2 weeks ago Type 2 diabetes mellitus with obesity  (HCC)   [place] [facility] [place], [place] T, NP   3 months ago Tinea cruris   [place] [facility] [place], [place] T, NP   4 months ago Type 2 diabetes mellitus with obesity (HCC)   [place] [facility] [place], Nicholas Dandy T, NP   5 months ago SOB (shortness of breath)   [place] [facility] [place], Nicholas Rank, NP   5 months ago Subacute cough   [place] [facility] [place], Nicholas Rank, NP       Future Appointments             In 2 months Cann[place], Nicholas Rank, NP [place] [facility]   In 4 months Vanna Scotland, MD [place] [facility] [place] [facility]

## 2022-10-17 NOTE — Progress Notes (Signed)
Virtual Visit via Telephone Note  I connected with Nicholas [name] on 10/17/22 at  1:45 PM EDT by telephone and verified that I am speaking with the correct person using two identifiers.  Location: Patient: Home Provider: [facility]   I discussed the limitations, risks, security and privacy concerns of performing an evaluation and management service by telephone and the availability of in person appointments. I also discussed with the patient that there may be a patient responsible charge related to this service. The patient expressed understanding and agreed to proceed.   History of Present Illness:  I spoke with Nicholas [name] via telephone as we are unable lengthen the video portion conference.  He reports that he is doing well in regards to his back pain and the medicines continue to work well for him.  He takes his hydrocodone twice a day and is trying to do some home stretches/exercises to stay active.  He has had some troubles with his heart.  He has a pacemaker and recently had a stress test with diminished responsiveness with his heart rate response.  It is likely this is one of the reasons for his lack of energy.  Otherwise he is in his usual state of health.  No changes in lower extremity strength function bowel or bladder function noted at this time.  He takes his medications as prescribed these continue to work well for him giving him about 75% relief of his low back pain hip pain and lower extremity  Review of systems: General: No fevers or chills Pulmonary: No shortness of breath or dyspnea Cardiac: No angina or palpitations or lightheadedness GI: No abdominal pain or constipation Psych: No depression   Assessment and Plan:  Current Outpatient Medications:    HYDROcodone-acetaminophen (NORCO/VICODIN) 5-325 MG tablet, Take 1 tablet by mouth 2 (two) times daily., Disp: 60 tablet, Rfl: 0   [START ON 11/16/2022] HYDROcodone-acetaminophen (NORCO/VICODIN) 5-325 MG tablet,  Take 1 tablet by mouth 2 (two) times daily., Disp: 60 tablet, Rfl: 0   acetaminophen (TYLENOL) 650 MG CR tablet, Take 650 mg by mouth every 8 (eight) hours as needed for pain., Disp: , Rfl:    albuterol (VENTOLIN HFA) 108 (90 Base) MCG/ACT inhaler, TAKE 2 PUFFS BY MOUTH EVERY 6 HOURS AS NEEDED, Disp: 6.7 each, Rfl: 0   Ascorbic Acid (VITAMIN C) 1000 MG tablet, Take 1,000 mg by mouth daily., Disp: , Rfl:    Blood Glucose Monitoring Suppl (ONETOUCH VERIO) w/Device KIT, Use to check blood sugar 3 times a day and document results, bring to appointments.  Goal is <130 fasting blood sugar and <180 two hours after meals., Disp: 1 kit, Rfl: 0   calcium-vitamin D (OSCAL WITH D) 500-5 MG-MCG tablet, Take 1 tablet by mouth daily with breakfast., Disp: , Rfl:    Cranberry 500 MG CAPS, Take 500 mg by mouth daily., Disp: , Rfl:    ELIQUIS 5 MG TABS tablet, TAKE 1 TABLET BY MOUTH TWICE A DAY, Disp: 180 tablet, Rfl: 1   empagliflozin (JARDIANCE) 25 MG TABS tablet, Take 1 tablet (25 mg total) by mouth daily., Disp: 30 tablet, Rfl: 4   gabapentin (NEURONTIN) 600 MG tablet, TAKE 1 TABLET BY MOUTH EVERYDAY AT BEDTIME, Disp: 90 tablet, Rfl: 4   glucose blood test strip, Use to check blood sugar 3 times a day and document results, bring to appointments.  Goal is <130 fasting blood sugar and <180 two hours after meals., Disp: 100 each, Rfl: 12   Lancets (  ONETOUCH ULTRASOFT) lancets, Use to check blood sugar 3 times a day and document results, bring to appointments.  Goal is <130 fasting blood sugar and <180 two hours after meals., Disp: 100 each, Rfl: 12   [START ON 10/24/2022] LORazepam (ATIVAN) 1 MG tablet, TAKE 1 TABLET BY MOUTH EVERYDAY AT BEDTIME, Disp: 90 tablet, Rfl: 0   Magnesium 400 MG TABS, Take 400 mg by mouth daily. , Disp: , Rfl:    MELATONIN PO, Take by mouth., Disp: , Rfl:    midodrine (PROAMATINE) 10 MG tablet, Take 0.5 tablets (5 mg total) by mouth 3 (three) times daily., Disp: 135 tablet, Rfl: 0   Multiple  Vitamin (MULTIVITAMIN WITH MINERALS) TABS tablet, Take 1 tablet by mouth daily. Centrum Silver, Disp: , Rfl:    mupirocin ointment (BACTROBAN) 2 %, Apply 1 Application topically 2 (two) times daily., Disp: 22 g, Rfl: 0   nystatin (MYCOSTATIN/NYSTOP) powder, Apply 1 Application topically 3 (three) times daily., Disp: 60 g, Rfl: 2   nystatin cream (MYCOSTATIN), Apply 1 Application topically 2 (two) times daily., Disp: 30 g, Rfl: 4   Omega-3 Fatty Acids (FISH OIL) 1200 MG CAPS, Take 1,200 mg by mouth daily., Disp: , Rfl:    oxymetazoline (AFRIN) 0.05 % nasal spray, Place 2 sprays into both nostrils at bedtime., Disp: , Rfl:    pantoprazole (PROTONIX) 20 MG tablet, TAKE 1 TABLET (20 MG TOTAL) BY MOUTH 2 (TWO) TIMES DAILY BEFORE A MEAL. (Patient taking differently: Take 20 mg by mouth daily.), Disp: 180 tablet, Rfl: 2   potassium chloride SA (KLOR-CON M) 20 MEQ tablet, Take 2 tablets (40 meq) with Torsemide, Disp: 30 tablet, Rfl: 3   QUEtiapine Fumarate (SEROQUEL XR) 150 MG 24 hr tablet, TAKE 1 TABLET BY MOUTH EVERYDAY AT BEDTIME, Disp: 90 tablet, Rfl: 4   rOPINIRole (REQUIP) 0.25 MG tablet, Take 0.25 mg by mouth 4 (four) times daily., Disp: , Rfl:    rosuvastatin (CRESTOR) 40 MG tablet, TAKE 1 TABLET BY MOUTH EVERY DAY, Disp: 90 tablet, Rfl: 1   solifenacin (VESICARE) 10 MG tablet, TAKE 1 TABLET BY MOUTH EVERY DAY, Disp: 90 tablet, Rfl: 3   tiZANidine (ZANAFLEX) 4 MG tablet, Take 1 tablet (4 mg total) by mouth every 6 (six) hours as needed for muscle spasms., Disp: 90 tablet, Rfl: 4   torsemide (DEMADEX) 20 MG tablet, TAKE 2 TABLETS (40 MG TOTAL) BY MOUTH DAILY AS NEEDED (FLUID RETENTION)., Disp: 90 tablet, Rfl: 0   venlafaxine XR (EFFEXOR-XR) 75 MG 24 hr capsule, TAKE 3 CAPSULES BY MOUTH EVERY DAY, Disp: 270 capsule, Rfl: 0   Past Medical History:  Diagnosis Date   Abdominal pain, chronic, right lower quadrant 12/13/2020   Anterior urethral stricture    Anxiety    Arthritis    a. knees, hips,  hands;  b. 11/2013 s/p L TKA @ [facility].   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 Chronic pain syndrome   2. Lumbar radiculopathy   3. Degeneration of lumbar  intervertebral disc   4. Bilateral sciatica   5. Chronic, continuous use of opioids   6. Bilateral hand pain   7. Facet syndrome, lumbar   8. Osteoarthritis of right hip, unspecified osteoarthritis type    Based on our conversation it is appropriate to refill his medicines for the next 2 months dated for 921 and 1021.  This to be for twice daily dosing.  Continue follow-up with cardiology for evaluation of his ongoing Cardiologic issues.  Will schedule him for return to clinic in 2 months.  No other changes in his pharmacologic regimen will be initiated.  He continues to respond favorably to chronic opioid medications without side effect.  Continue efforts at weight loss and strengthening with continue follow-up with his primary care physician for baseline medical care.   I discussed the assessment and treatment plan with the patient. The patient was provided an opportunity to ask questions and all were answered. The patient agreed with the plan and demonstrated an understanding of the instructions.   The patient was advised to call back or seek an in-person evaluation if the symptoms worsen or if the condition fails to improve as anticipated.  I provided 30 minutes of non-face-to-face time during this encounter.   Yevette Edwards, MD

## 2022-10-18 ENCOUNTER — Encounter: Payer: Medicare Other | Admitting: *Deleted

## 2022-10-18 VITALS — Ht 71.5 in | Wt 280.4 lb

## 2022-10-18 DIAGNOSIS — R0609 Other forms of dyspnea: Secondary | ICD-10-CM | POA: Diagnosis present

## 2022-10-18 DIAGNOSIS — I2724 Chronic thromboembolic pulmonary hypertension: Secondary | ICD-10-CM

## 2022-10-18 NOTE — Progress Notes (Signed)
Assessment start time: 11:13 AM  Digestive issues/concerns: no known food allergies, reports some pork and other foods can get caught in his throat. Is aware he needs to chew well, take smaller bites.   24-hours Recall: B: raisin bran, banana, fruit cup, 2% milk L: nothing - a few lifesavers D: chicken breast, green beans, noodles   Beverages Fruit2O (20oz), glass of 2% milk Caffeine coffee sometimes  Supplements MVI, Vitamin D, fish oil Intake Patterns Snacks on sweet, but appetite is less than it used to be.   Education r/t nutrition plan Patient drinking ~20oz of water daily. Spoke about the importance of hydration and set short term goal of drinking 2 bottles per day (~32oz), with a long term goal of ~64oz daily. He reports that he likes to snack on sweets, but his appetite has been less than it used to be, often not missing a meal or getting full quickly. Reviewed mediterranean diet handout, educated on types of fats, sources, how to read food label. Educated on how healthy fats can be heart healthy and high in calories. Explain how choosing calorie dense foods such as peanut butter, olives, olives or avocado oils. Reviewed his 24hr food recall, explained how he should look to pair complex carbs with a protein or healthy fats when making meals or snacks. Brainstormed and built out a few example meals and snacks focusing on getting enough nutrition with his poor appetite.     Goal 1: Drink 2 bottles of water daily Goal 2: Eat something nutrient/calorie dense at every meal hour Goal 3: Pair a complex carb with a protein or healthy fat  End time 12:05 PM

## 2022-10-18 NOTE — Patient Instructions (Signed)
Patient Instructions  Patient Details  Name: Nicholas [name] MRN: [id number] Date of Birth: 1945/05/09 Referring Provider:  Birdena Jubilee, MD  Below are your personal goals for exercise, nutrition, and risk factors. Our goal is to help you stay on track towards obtaining and maintaining these goals. We will be discussing your progress on these goals with you throughout the program.  Initial Exercise Prescription:  Initial Exercise Prescription - 10/18/22 1200       Date of Initial Exercise RX and Referring Provider   Date 10/18/22    Referring Provider Dr. Birdena Jubilee      Oxygen   Maintain Oxygen Saturation 88% or higher      NuStep   Level 1   T6   SPM 80    Minutes 15    METs 1.07      REL-XR   Level 1    Speed 50    Minutes 15    METs 1.07      Track   Laps 10    Minutes 15    METs 1.54      Prescription Details   Frequency (times per week) 2    Duration Progress to 30 minutes of continuous aerobic without signs/symptoms of physical distress      Intensity   THRR 40-80% of Max Heartrate 116-134    Ratings of Perceived Exertion 11-13    Perceived Dyspnea 0-4      Progression   Progression Continue to progress workloads to maintain intensity without signs/symptoms of physical distress.      Resistance Training   Training Prescription Yes    Weight 3    Reps 10-15             Exercise Goals: Frequency: Be able to perform aerobic exercise two to three times per week in program working toward 2-5 days per week of home exercise.  Intensity: Work with a perceived exertion of 11 (fairly light) - 15 (hard) while following your exercise prescription.  We will make changes to your prescription with you as you progress through the program.   Duration: Be able to do 30 to 45 minutes of continuous aerobic exercise in addition to a 5 minute warm-up and a 5 minute cool-down routine.   Nutrition Goals: Your personal nutrition goals will be established  when you do your nutrition analysis with the dietician.  The following are general nutrition guidelines to follow: Cholesterol < 200mg /day Sodium < 1500mg /day Fiber: Men over 50 yrs - 30 grams per day  Personal Goals:  Personal Goals and Risk Factors at Admission - 10/18/22 1251       Core Components/Risk Factors/Patient Goals on Admission    Weight Management Yes;Weight Loss    Intervention Weight Management: Develop a combined nutrition and exercise program designed to reach desired caloric intake, while maintaining appropriate intake of nutrient and fiber, sodium and fats, and appropriate energy expenditure required for the weight goal.;Weight Management: Provide education and appropriate resources to help participant work on and attain dietary goals.;Weight Management/Obesity: Establish reasonable short term and long term weight goals.;Obesity: Provide education and appropriate resources to help participant work on and attain dietary goals.    Admit Weight 280 lb 6.4 oz (127.2 kg)    Goal Weight: Short Term 270 lb (122.5 kg)    Goal Weight: Long Term 225 lb (102.1 kg)    Expected Outcomes Short Term: Continue to assess and modify interventions until short term weight is achieved;Long Term: Adherence to  nutrition and physical activity/exercise program aimed toward attainment of established weight goal;Weight Loss: Understanding of general recommendations for a balanced deficit meal plan, which promotes 1-2 lb weight loss per week and includes a negative energy balance of [phone] kcal/d;Understanding recommendations for meals to include 15-35% energy as protein, 25-35% energy from fat, 35-60% energy from carbohydrates, less than 200mg  of dietary cholesterol, 20-35 gm of total fiber daily;Understanding of distribution of calorie intake throughout the day with the consumption of 4-5 meals/snacks    Improve shortness of breath with ADL's Yes    Intervention Provide education, individualized  exercise plan and daily activity instruction to help decrease symptoms of SOB with activities of daily living.    Expected Outcomes Short Term: Improve cardiorespiratory fitness to achieve a reduction of symptoms when performing ADLs;Long Term: Be able to perform more ADLs without symptoms or delay the onset of symptoms    Diabetes Yes    Intervention Provide education about signs/symptoms and action to take for hypo/hyperglycemia.;Provide education about proper nutrition, including hydration, and aerobic/resistive exercise prescription along with prescribed medications to achieve blood glucose in normal ranges: Fasting glucose 65-99 mg/dL    Expected Outcomes Short Term: Participant verbalizes understanding of the signs/symptoms and immediate care of hyper/hypoglycemia, proper foot care and importance of medication, aerobic/resistive exercise and nutrition plan for blood glucose control.;Long Term: Attainment of HbA1C < 7%.    Hypertension Yes    Intervention Provide education on lifestyle modifcations including regular physical activity/exercise, weight management, moderate sodium restriction and increased consumption of fresh fruit, vegetables, and low fat dairy, alcohol moderation, and smoking cessation.;Monitor prescription use compliance.    Expected Outcomes Short Term: Continued assessment and intervention until BP is < 140/74mm HG in hypertensive participants. < 130/54mm HG in hypertensive participants with diabetes, heart failure or chronic kidney disease.;Long Term: Maintenance of blood pressure at goal levels.    Lipids Yes    Intervention Provide education and support for participant on nutrition & aerobic/resistive exercise along with prescribed medications to achieve LDL 70mg , HDL >40mg .    Expected Outcomes Short Term: Participant states understanding of desired cholesterol values and is compliant with medications prescribed. Participant is following exercise prescription and nutrition  guidelines.;Long Term: Cholesterol controlled with medications as prescribed, with individualized exercise RX and with personalized nutrition plan. Value goals: LDL < 70mg , HDL > 40 mg.             Tobacco Use Initial Evaluation: Social History   Tobacco Use  Smoking Status Never  Smokeless Tobacco Never    Exercise Goals and Review:  Exercise Goals     Row Name 10/18/22 1247             Exercise Goals   Increase Physical Activity Yes       Intervention Provide advice, education, support and counseling about physical activity/exercise needs.;Develop an individualized exercise prescription for aerobic and resistive training based on initial evaluation findings, risk stratification, comorbidities and participant's personal goals.       Expected Outcomes Short Term: Attend rehab on a regular basis to increase amount of physical activity.;Long Term: Add in home exercise to make exercise part of routine and to increase amount of physical activity.;Long Term: Exercising regularly at least 3-5 days a week.       Increase Strength and Stamina Yes       Intervention Provide advice, education, support and counseling about physical activity/exercise needs.;Develop an individualized exercise prescription for aerobic and resistive training based on initial evaluation  findings, risk stratification, comorbidities and participant's personal goals.       Expected Outcomes Short Term: Increase workloads from initial exercise prescription for resistance, speed, and METs.;Short Term: Perform resistance training exercises routinely during rehab and add in resistance training at home;Long Term: Improve cardiorespiratory fitness, muscular endurance and strength as measured by increased METs and functional capacity ( )       Able to understand and use rate of perceived exertion (RPE) scale Yes       Intervention Provide education and explanation on how to use RPE scale       Expected Outcomes Short  Term: Able to use RPE daily in rehab to express subjective intensity level;Long Term:  Able to use RPE to guide intensity level when exercising independently       Able to understand and use Dyspnea scale Yes       Intervention Provide education and explanation on how to use Dyspnea scale       Expected Outcomes Short Term: Able to use Dyspnea scale daily in rehab to express subjective sense of shortness of breath during exertion;Long Term: Able to use Dyspnea scale to guide intensity level when exercising independently       Knowledge and understanding of Target Heart Rate Range (THRR) Yes       Intervention Provide education and explanation of THRR including how the numbers were predicted and where they are located for reference       Expected Outcomes Short Term: Able to state/look up THRR;Long Term: Able to use THRR to govern intensity when exercising independently;Short Term: Able to use daily as guideline for intensity in rehab       Able to check pulse independently Yes       Intervention Provide education and demonstration on how to check pulse in carotid and radial arteries.;Review the importance of being able to check your own pulse for safety during independent exercise       Expected Outcomes Short Term: Able to explain why pulse checking is important during independent exercise;Long Term: Able to check pulse independently and accurately       Understanding of Exercise Prescription Yes       Intervention Provide education, explanation, and written materials on patient's individual exercise prescription       Expected Outcomes Short Term: Able to explain program exercise prescription;Long Term: Able to explain home exercise prescription to exercise independently                Copy of goals given to participant.

## 2022-10-18 NOTE — Progress Notes (Signed)
Pulmonary Individual Treatment Plan  Patient Details  Name: Nicholas [name] MRN: [id number] Date of Birth: July 28, 1945 Referring Provider:   Flowsheet Row Pulmonary Rehab from 10/18/2022 in [facility] Cardiac and Pulmonary Rehab  Referring Provider Dr. Birdena Jubilee       Initial Encounter Date:  Flowsheet Row Pulmonary Rehab from 10/18/2022 in [facility] Cardiac and Pulmonary Rehab  Date 10/18/22       Visit Diagnosis: DOE (dyspnea on exertion)  Chronic thromboembolic pulmonary hypertension (HCC)  Patient's Home Medications on Admission:  Current Outpatient Medications:    acetaminophen (TYLENOL) 650 MG CR tablet, Take 650 mg by mouth every 8 (eight) hours as needed for pain., Disp: , Rfl:    albuterol (VENTOLIN HFA) 108 (90 Base) MCG/ACT inhaler, TAKE 2 PUFFS BY MOUTH EVERY 6 HOURS AS NEEDED, Disp: 6.7 each, Rfl: 0   Ascorbic Acid (VITAMIN C) 1000 MG tablet, Take 1,000 mg by mouth daily., Disp: , Rfl:    Blood Glucose Monitoring Suppl (ONETOUCH VERIO) w/Device KIT, Use to check blood sugar 3 times a day and document results, bring to appointments.  Goal is <130 fasting blood sugar and <180 two hours after meals., Disp: 1 kit, Rfl: 0   calcium-vitamin D (OSCAL WITH D) 500-5 MG-MCG tablet, Take 1 tablet by mouth daily with breakfast., Disp: , Rfl:    Cranberry 500 MG CAPS, Take 500 mg by mouth daily., Disp: , Rfl:    ELIQUIS 5 MG TABS tablet, TAKE 1 TABLET BY MOUTH TWICE A DAY, Disp: 180 tablet, Rfl: 1   empagliflozin (JARDIANCE) 25 MG TABS tablet, Take 1 tablet (25 mg total) by mouth daily., Disp: 30 tablet, Rfl: 4   gabapentin (NEURONTIN) 600 MG tablet, TAKE 1 TABLET BY MOUTH EVERYDAY AT BEDTIME, Disp: 90 tablet, Rfl: 4   glucose blood test strip, Use to check blood sugar 3 times a day and document results, bring to appointments.  Goal is <130 fasting blood sugar and <180 two hours after meals., Disp: 100 each, Rfl: 12   HYDROcodone-acetaminophen (NORCO/VICODIN) 5-325 MG tablet, Take 1  tablet by mouth 2 (two) times daily., Disp: 60 tablet, Rfl: 0   [START ON 11/16/2022] HYDROcodone-acetaminophen (NORCO/VICODIN) 5-325 MG tablet, Take 1 tablet by mouth 2 (two) times daily., Disp: 60 tablet, Rfl: 0   Lancets (ONETOUCH ULTRASOFT) lancets, Use to check blood sugar 3 times a day and document results, bring to appointments.  Goal is <130 fasting blood sugar and <180 two hours after meals., Disp: 100 each, Rfl: 12   [START ON 10/24/2022] LORazepam (ATIVAN) 1 MG tablet, TAKE 1 TABLET BY MOUTH EVERYDAY AT BEDTIME, Disp: 90 tablet, Rfl: 0   Magnesium 400 MG TABS, Take 400 mg by mouth daily. , Disp: , Rfl:    MELATONIN PO, Take by mouth., Disp: , Rfl:    midodrine (PROAMATINE) 10 MG tablet, Take 0.5 tablets (5 mg total) by mouth 3 (three) times daily., Disp: 135 tablet, Rfl: 0   Multiple Vitamin (MULTIVITAMIN WITH MINERALS) TABS tablet, Take 1 tablet by mouth daily. Centrum Silver, Disp: , Rfl:    mupirocin ointment (BACTROBAN) 2 %, Apply 1 Application topically 2 (two) times daily., Disp: 22 g, Rfl: 0   nystatin (MYCOSTATIN/NYSTOP) powder, Apply 1 Application topically 3 (three) times daily., Disp: 60 g, Rfl: 2   nystatin cream (MYCOSTATIN), Apply 1 Application topically 2 (two) times daily., Disp: 30 g, Rfl: 4   Omega-3 Fatty Acids (FISH OIL) 1200 MG CAPS, Take 1,200 mg by mouth daily., Disp: ,  Rfl:    oxymetazoline (AFRIN) 0.05 % nasal spray, Place 2 sprays into both nostrils at bedtime., Disp: , Rfl:    pantoprazole (PROTONIX) 20 MG tablet, TAKE 1 TABLET (20 MG TOTAL) BY MOUTH 2 (TWO) TIMES DAILY BEFORE A MEAL. (Patient taking differently: Take 20 mg by mouth daily.), Disp: 180 tablet, Rfl: 2   potassium chloride SA (KLOR-CON M) 20 MEQ tablet, Take 2 tablets (40 meq) with Torsemide, Disp: 30 tablet, Rfl: 3   QUEtiapine Fumarate (SEROQUEL XR) 150 MG 24 hr tablet, TAKE 1 TABLET BY MOUTH EVERYDAY AT BEDTIME, Disp: 90 tablet, Rfl: 4   rOPINIRole (REQUIP) 0.25 MG tablet, Take 0.25 mg by mouth 4  (four) times daily., Disp: , Rfl:    rosuvastatin (CRESTOR) 40 MG tablet, TAKE 1 TABLET BY MOUTH EVERY DAY, Disp: 90 tablet, Rfl: 1   solifenacin (VESICARE) 10 MG tablet, TAKE 1 TABLET BY MOUTH EVERY DAY, Disp: 90 tablet, Rfl: 3   tiZANidine (ZANAFLEX) 4 MG tablet, Take 1 tablet (4 mg total) by mouth every 6 (six) hours as needed for muscle spasms., Disp: 90 tablet, Rfl: 4   torsemide (DEMADEX) 20 MG tablet, TAKE 2 TABLETS (40 MG TOTAL) BY MOUTH DAILY AS NEEDED (FLUID RETENTION)., Disp: 90 tablet, Rfl: 0   venlafaxine XR (EFFEXOR-XR) 75 MG 24 hr capsule, TAKE 3 CAPSULES BY MOUTH EVERY DAY, Disp: 270 capsule, Rfl: 0  Past Medical History: Past Medical History:  Diagnosis Date   Abdominal pain, chronic, right lower quadrant 12/13/2020   Anterior urethral stricture    Anxiety    Arthritis    a. knees, hips, hands;  b. 11/2013 s/p L TKA @ [facility].   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 Dr Graciela Husbands   Depression    DVT (deep venous thrombosis) (HCC)    Erosive esophagitis    Gross hematuria    Hyperlipemia    Hypertension    borderline   Internal hemorrhoids    Phimosis    Pulmonary embolism (HCC)     Tobacco Use: Social History   Tobacco Use  Smoking Status Never  Smokeless Tobacco Never    Labs: Review Flowsheet  More data exists      Latest Ref Rng & Units 08/24/2021 11/29/2021 03/01/2022 05/30/2022 10/02/2022  Labs for ITP Cardiac and Pulmonary Rehab  Cholestrol 100 - 199 mg/dL 409  811  914  782  956   LDL (calc) 0 - 99 mg/dL 59  63  58  59  213   HDL-C >39 mg/dL 55  49  58  49  42   Trlycerides 0 - 149 mg/dL 77  086  97  578  469   Hemoglobin A1c 4.8 - 5.6 % 5.9  6.8  6.8  7.0  7.0     Details             Pulmonary Assessment Scores:  Pulmonary Assessment Scores     Row Name 10/18/22 1252         ADL UCSD   ADL Phase Entry     SOB  Score total 89     Rest 0     Walk 5     Stairs 5     Bath 5     Dress 4     Shop 5  CAT Score   CAT Score 20       mMRC Score   mMRC Score 3              UCSD: Self-administered rating of dyspnea associated with activities of daily living (ADLs) 6-point scale (0 = "not at all" to 5 = "maximal or unable to do because of breathlessness")  Scoring Scores range from 0 to 120.  Minimally important difference is 5 units  CAT: CAT can identify the health impairment of COPD patients and is better correlated with disease progression.  CAT has a scoring range of zero to 40. The CAT score is classified into four groups of low (less than 10), medium (10 - 20), high (21-30) and very high (31-40) based on the impact level of disease on health status. A CAT score over 10 suggests significant symptoms.  A worsening CAT score could be explained by an exacerbation, poor medication adherence, poor inhaler technique, or progression of COPD or comorbid conditions.  CAT MCID is 2 points  mMRC: mMRC (Modified Medical Research Council) Dyspnea Scale is used to assess the degree of baseline functional disability in patients of respiratory disease due to dyspnea. No minimal important difference is established. A decrease in score of 1 point or greater is considered a positive change.   Pulmonary Function Assessment:  Pulmonary Function Assessment - 10/09/22 1023       Breath   Shortness of Breath Yes;Limiting activity             Exercise Target Goals: Exercise Program Goal: Individual exercise prescription set using results from initial 6 min walk test and THRR while considering  patient's activity barriers and safety.   Exercise Prescription Goal: Initial exercise prescription builds to 30-45 minutes a day of aerobic activity, 2-3 days per week.  Home exercise guidelines will be given to patient during program as part of exercise prescription that the participant will  acknowledge.  Education: Aerobic Exercise: - Group verbal and visual presentation on the components of exercise prescription. Introduces F.I.T.T principle from ACSM for exercise prescriptions.  Reviews F.I.T.T. principles of aerobic exercise including progression. Written material given at graduation.   Education: Resistance Exercise: - Group verbal and visual presentation on the components of exercise prescription. Introduces F.I.T.T principle from ACSM for exercise prescriptions  Reviews F.I.T.T. principles of resistance exercise including progression. Written material given at graduation.    Education: Exercise & Equipment Safety: - Individual verbal instruction and demonstration of equipment use and safety with use of the equipment. Flowsheet Row Pulmonary Rehab from 10/18/2022 in [facility] Cardiac and Pulmonary Rehab  Date 10/18/22  Educator [facility]  Instruction Review Code 1- Verbalizes Understanding       Education: Exercise Physiology & General Exercise Guidelines: - Group verbal and written instruction with models to review the exercise physiology of the cardiovascular system and associated critical values. Provides general exercise guidelines with specific guidelines to those with heart or lung disease.    Education: Flexibility, Balance, Mind/Body Relaxation: - Group verbal and visual presentation with interactive activity on the components of exercise prescription. Introduces F.I.T.T principle from ACSM for exercise prescriptions. Reviews F.I.T.T. principles of flexibility and balance exercise training including progression. Also discusses the mind body connection.  Reviews various relaxation techniques to help reduce and manage stress (i.e. Deep breathing, progressive muscle relaxation, and visualization). Balance handout provided to take home. Written material given at graduation.   Activity Barriers & Risk Stratification:  Activity Barriers & Cardiac Risk Stratification -  10/18/22  1236       Activity Barriers & Cardiac Risk Stratification   Activity Barriers Right Hip Replacement;Left Knee Replacement;Left Hip Replacement;Right Knee Replacement             6 Minute Walk:  6 Minute Walk     Row Name 10/18/22 1233         6 Minute Walk   Phase Initial     Distance 825 feet     Walk Time 6 minutes     # of Rest Breaks 0     MPH 1.56     METS 1.07     RPE 13     Perceived Dyspnea  3     VO2 Peak 3.76     Symptoms Yes (comment)     Comments SOB     Resting HR 99 bpm     Resting BP 110/60     Resting Oxygen Saturation  95 %     Exercise Oxygen Saturation  during 6 min walk 93 %     Max Ex. HR 107 bpm     Max Ex. BP 128/70     2 Minute Post BP 118/76       Interval HR   1 Minute HR 81     2 Minute HR 70     3 Minute HR 84     4 Minute HR 82     5 Minute HR 77     6 Minute HR 107     2 Minute Post HR 102     Interval Heart Rate? Yes       Interval Oxygen   Interval Oxygen? Yes     Baseline Oxygen Saturation % 95 %     1 Minute Oxygen Saturation % 93 %     1 Minute Liters of Oxygen 0 L     2 Minute Oxygen Saturation % 94 %     2 Minute Liters of Oxygen 0 L     3 Minute Oxygen Saturation % 93 %     3 Minute Liters of Oxygen 0 L     4 Minute Oxygen Saturation % 93 %     4 Minute Liters of Oxygen 0 L     5 Minute Oxygen Saturation % 94 %     5 Minute Liters of Oxygen 0 L     6 Minute Oxygen Saturation % 94 %     6 Minute Liters of Oxygen 0 L     2 Minute Post Oxygen Saturation % 94 %     2 Minute Post Liters of Oxygen 0 L             Oxygen Initial Assessment:  Oxygen Initial Assessment - 10/18/22 1252       Home Oxygen   Home Oxygen Device None    Sleep Oxygen Prescription CPAP    Liters per minute 0    Home Exercise Oxygen Prescription None    Home Resting Oxygen Prescription None    Compliance with Home Oxygen Use Yes      Initial 6 min Walk   Oxygen Used None      Program Oxygen Prescription   Program Oxygen  Prescription None      Intervention   Short Term Goals To learn and exhibit compliance with exercise, home and travel O2 prescription;To learn and understand importance of monitoring SPO2 with pulse oximeter and demonstrate accurate use of the pulse oximeter.;To  learn and understand importance of maintaining oxygen saturations>88%;To learn and demonstrate proper pursed lip breathing techniques or other breathing techniques. ;To learn and demonstrate proper use of respiratory medications    Long  Term Goals Exhibits compliance with exercise, home  and travel O2 prescription;Verbalizes importance of monitoring SPO2 with pulse oximeter and return demonstration;Maintenance of O2 saturations>88%;Exhibits proper breathing techniques, such as pursed lip breathing or other method taught during program session;Compliance with respiratory medication;Demonstrates proper use of MDI's             Oxygen Re-Evaluation:   Oxygen Discharge (Final Oxygen Re-Evaluation):   Initial Exercise Prescription:  Initial Exercise Prescription - 10/18/22 1200       Date of Initial Exercise RX and Referring Provider   Date 10/18/22    Referring Provider Dr. Birdena Jubilee      Oxygen   Maintain Oxygen Saturation 88% or higher      NuStep   Level 1   T6   SPM 80    Minutes 15    METs 1.07      REL-XR   Level 1    Speed 50    Minutes 15    METs 1.07      Track   Laps 10    Minutes 15    METs 1.54      Prescription Details   Frequency (times per week) 2    Duration Progress to 30 minutes of continuous aerobic without signs/symptoms of physical distress      Intensity   THRR 40-80% of Max Heartrate 116-134    Ratings of Perceived Exertion 11-13    Perceived Dyspnea 0-4      Progression   Progression Continue to progress workloads to maintain intensity without signs/symptoms of physical distress.      Resistance Training   Training Prescription Yes    Weight 3    Reps 10-15              Perform Capillary Blood Glucose checks as needed.  Exercise Prescription Changes:   Exercise Prescription Changes     Row Name 10/18/22 1200             Response to Exercise   Blood Pressure (Admit) 110/60       Blood Pressure (Exercise) 128/70       Blood Pressure (Exit) 118/76       Heart Rate (Admit) 99 bpm       Heart Rate (Exercise) 107 bpm       Heart Rate (Exit) 92 bpm       Oxygen Saturation (Admit) 95 %       Oxygen Saturation (Exercise) 93 %       Oxygen Saturation (Exit) 94 %       Rating of Perceived Exertion (Exercise) 13       Perceived Dyspnea (Exercise) 3       Symptoms SOB       Comments 6 MWT results                Exercise Comments:   Exercise Goals and Review:   Exercise Goals     Row Name 10/18/22 1247             Exercise Goals   Increase Physical Activity Yes       Intervention Provide advice, education, support and counseling about physical activity/exercise needs.;Develop an individualized exercise prescription for aerobic and resistive training based on initial evaluation findings, risk stratification, comorbidities  and participant's personal goals.       Expected Outcomes Short Term: Attend rehab on a regular basis to increase amount of physical activity.;Long Term: Add in home exercise to make exercise part of routine and to increase amount of physical activity.;Long Term: Exercising regularly at least 3-5 days a week.       Increase Strength and Stamina Yes       Intervention Provide advice, education, support and counseling about physical activity/exercise needs.;Develop an individualized exercise prescription for aerobic and resistive training based on initial evaluation findings, risk stratification, comorbidities and participant's personal goals.       Expected Outcomes Short Term: Increase workloads from initial exercise prescription for resistance, speed, and METs.;Short Term: Perform resistance training exercises  routinely during rehab and add in resistance training at home;Long Term: Improve cardiorespiratory fitness, muscular endurance and strength as measured by increased METs and functional capacity ( )       Able to understand and use rate of perceived exertion (RPE) scale Yes       Intervention Provide education and explanation on how to use RPE scale       Expected Outcomes Short Term: Able to use RPE daily in rehab to express subjective intensity level;Long Term:  Able to use RPE to guide intensity level when exercising independently       Able to understand and use Dyspnea scale Yes       Intervention Provide education and explanation on how to use Dyspnea scale       Expected Outcomes Short Term: Able to use Dyspnea scale daily in rehab to express subjective sense of shortness of breath during exertion;Long Term: Able to use Dyspnea scale to guide intensity level when exercising independently       Knowledge and understanding of Target Heart Rate Range (THRR) Yes       Intervention Provide education and explanation of THRR including how the numbers were predicted and where they are located for reference       Expected Outcomes Short Term: Able to state/look up THRR;Long Term: Able to use THRR to govern intensity when exercising independently;Short Term: Able to use daily as guideline for intensity in rehab       Able to check pulse independently Yes       Intervention Provide education and demonstration on how to check pulse in carotid and radial arteries.;Review the importance of being able to check your own pulse for safety during independent exercise       Expected Outcomes Short Term: Able to explain why pulse checking is important during independent exercise;Long Term: Able to check pulse independently and accurately       Understanding of Exercise Prescription Yes       Intervention Provide education, explanation, and written materials on patient's individual exercise prescription        Expected Outcomes Short Term: Able to explain program exercise prescription;Long Term: Able to explain home exercise prescription to exercise independently                Exercise Goals Re-Evaluation :   Discharge Exercise Prescription (Final Exercise Prescription Changes):  Exercise Prescription Changes - 10/18/22 1200       Response to Exercise   Blood Pressure (Admit) 110/60    Blood Pressure (Exercise) 128/70    Blood Pressure (Exit) 118/76    Heart Rate (Admit) 99 bpm    Heart Rate (Exercise) 107 bpm    Heart Rate (Exit) 92 bpm  Oxygen Saturation (Admit) 95 %    Oxygen Saturation (Exercise) 93 %    Oxygen Saturation (Exit) 94 %    Rating of Perceived Exertion (Exercise) 13    Perceived Dyspnea (Exercise) 3    Symptoms SOB    Comments 6 MWT results             Nutrition:  Target Goals: Understanding of nutrition guidelines, daily intake of sodium 1500mg , cholesterol 200mg , calories 30% from fat and 7% or less from saturated fats, daily to have 5 or more servings of fruits and vegetables.  Education: All About Nutrition: -Group instruction provided by verbal, written material, interactive activities, discussions, models, and posters to present general guidelines for heart healthy nutrition including fat, fiber, MyPlate, the role of sodium in heart healthy nutrition, utilization of the nutrition label, and utilization of this knowledge for meal planning. Follow up email sent as well. Written material given at graduation.   Biometrics:  Pre Biometrics - 10/18/22 1248       Pre Biometrics   Height 5' 11.5" (1.816 m)    Weight 280 lb 6.4 oz (127.2 kg)    Waist Circumference 51 inches    Hip Circumference 53.5 inches    Waist to Hip Ratio 0.95 %    BMI (Calculated) 38.57    Single Leg Stand 1.6 seconds              Nutrition Therapy Plan and Nutrition Goals:   Nutrition Assessments:  MEDIFICTS Score Key: >=70 Need to make dietary changes  40-70  Heart Healthy Diet <= 40 Therapeutic Level Cholesterol Diet  Flowsheet Row Pulmonary Rehab from 10/18/2022 in [facility] Cardiac and Pulmonary Rehab  Picture Your Plate Total Score on Admission 58      Picture Your Plate Scores: <40 Unhealthy dietary pattern with much room for improvement. 41-50 Dietary pattern unlikely to meet recommendations for good health and room for improvement. 51-60 More healthful dietary pattern, with some room for improvement.  >60 Healthy dietary pattern, although there may be some specific behaviors that could be improved.   Nutrition Goals Re-Evaluation:   Nutrition Goals Discharge (Final Nutrition Goals Re-Evaluation):   Psychosocial: Target Goals: Acknowledge presence or absence of significant depression and/or stress, maximize coping skills, provide positive support system. Participant is able to verbalize types and ability to use techniques and skills needed for reducing stress and depression.   Education: Stress, Anxiety, and Depression - Group verbal and visual presentation to define topics covered.  Reviews how body is impacted by stress, anxiety, and depression.  Also discusses healthy ways to reduce stress and to treat/manage anxiety and depression.  Written material given at graduation.   Education: Sleep Hygiene -Provides group verbal and written instruction about how sleep can affect your health.  Define sleep hygiene, discuss sleep cycles and impact of sleep habits. Review good sleep hygiene tips.    Initial Review & Psychosocial Screening:  Initial Psych Review & Screening - 10/09/22 1025       Initial Review   Current issues with Current Psychotropic Meds;Current Sleep Concerns      Family Dynamics   Good Support System? Yes    Comments Nicholas [name] has a problem with his son and is not on speaking. He can look to his wife as a great support system. His sleep has been doing well and takes medication.      Barriers   Psychosocial barriers to  participate in program The patient should benefit from training in stress  management and relaxation.      Screening Interventions   Interventions Encouraged to exercise;To provide support and resources with identified psychosocial needs;Provide feedback about the scores to participant    Expected Outcomes Short Term goal: Utilizing psychosocial counselor, staff and physician to assist with identification of specific Stressors or current issues interfering with healing process. Setting desired goal for each stressor or current issue identified.;Long Term Goal: Stressors or current issues are controlled or eliminated.;Short Term goal: Identification and review with participant of any Quality of Life or Depression concerns found by scoring the questionnaire.;Long Term goal: The participant improves quality of Life and PHQ9 Scores as seen by post scores and/or verbalization of changes             Quality of Life Scores:  Scores of 19 and below usually indicate a poorer quality of life in these areas.  A difference of  2-3 points is a clinically meaningful difference.  A difference of 2-3 points in the total score of the Quality of Life Index has been associated with significant improvement in overall quality of life, self-image, physical symptoms, and general health in studies assessing change in quality of life.  PHQ-9: Review Flowsheet  More data exists      10/18/2022 10/02/2022 06/06/2022 04/26/2022 12/05/2021  Depression screen PHQ 2/9  Decreased Interest 1 1 1 2  0  Down, Depressed, Hopeless 1 1 1 2  0  PHQ - 2 Score 2 2 2 4  0  Altered sleeping 0 0 1 3 -  Tired, decreased energy 3 3 1 3  -  Change in appetite 1 0 0 0 -  Feeling bad or failure about yourself  2 0 0 2 -  Trouble concentrating 0 0 0 0 -  Moving slowly or fidgety/restless 0 0 0 1 -  Suicidal thoughts 1 0 0 0 -  PHQ-9 Score 9 5 4 13  -  Difficult doing work/chores Very difficult Somewhat difficult - - -    Details            Interpretation of Total Score  Total Score Depression Severity:  1-4 = Minimal depression, 5-9 = Mild depression, 10-14 = Moderate depression, 15-19 = Moderately severe depression, 20-27 = Severe depression   Psychosocial Evaluation and Intervention:  Psychosocial Evaluation - 10/09/22 1027       Psychosocial Evaluation & Interventions   Interventions Relaxation education;Encouraged to exercise with the program and follow exercise prescription;Stress management education    Comments Nicholas [name] has a problem with his son and is not on speaking. He can look to his wife as a great support system. His sleep has been doing well and takes medication.    Expected Outcomes Short: Start [facility] to help with mood. Long: Maintain a healthy mental state.    Continue Psychosocial Services  Follow up required by staff             Psychosocial Re-Evaluation:   Psychosocial Discharge (Final Psychosocial Re-Evaluation):   Education: Education Goals: Education classes will be provided on a weekly basis, covering required topics. Participant will state understanding/return demonstration of topics presented.  Learning Barriers/Preferences:  Learning Barriers/Preferences - 10/09/22 1022       Learning Barriers/Preferences   Learning Barriers None    Learning Preferences None             General Pulmonary Education Topics:  Infection Prevention: - Provides verbal and written material to individual with discussion of infection control including proper hand washing and proper equipment  cleaning during exercise session. Flowsheet Row Pulmonary Rehab from 10/18/2022 in [facility] Cardiac and Pulmonary Rehab  Date 10/18/22  Educator [facility]  Instruction Review Code 1- Verbalizes Understanding       Falls Prevention: - Provides verbal and written material to individual with discussion of falls prevention and safety. Flowsheet Row Pulmonary Rehab from 10/18/2022 in [facility] Cardiac and Pulmonary Rehab   Date 10/18/22  Educator [facility]  Instruction Review Code 1- Verbalizes Understanding       Chronic Lung Disease Review: - Group verbal instruction with posters, models, PowerPoint presentations and videos,  to review new updates, new respiratory medications, new advancements in procedures and treatments. Providing information on websites and "800" numbers for continued self-education. Includes information about supplement oxygen, available portable oxygen systems, continuous and intermittent flow rates, oxygen safety, concentrators, and Medicare reimbursement for oxygen. Explanation of Pulmonary Drugs, including class, frequency, complications, importance of spacers, rinsing mouth after steroid MDI's, and proper cleaning methods for nebulizers. Review of basic lung anatomy and physiology related to function, structure, and complications of lung disease. Review of risk factors. Discussion about methods for diagnosing sleep apnea and types of masks and machines for OSA. Includes a review of the use of types of environmental controls: home humidity, furnaces, filters, dust mite/pet prevention, HEPA vacuums. Discussion about weather changes, air quality and the benefits of nasal washing. Instruction on Warning signs, infection symptoms, calling MD promptly, preventive modes, and value of vaccinations. Review of effective airway clearance, coughing and/or vibration techniques. Emphasizing that all should Create an Action Plan. Written material given at graduation. Flowsheet Row Pulmonary Rehab from 10/18/2022 in [facility] Cardiac and Pulmonary Rehab  Education need identified 10/18/22       AED/CPR: - Group verbal and written instruction with the use of models to demonstrate the basic use of the AED with the basic ABC's of resuscitation.    Anatomy and Cardiac Procedures: - Group verbal and visual presentation and models provide information about basic cardiac anatomy and function. Reviews the testing methods  done to diagnose heart disease and the outcomes of the test results. Describes the treatment choices: Medical Management, Angioplasty, or Coronary Bypass Surgery for treating various heart conditions including Myocardial Infarction, Angina, Valve Disease, and Cardiac Arrhythmias.  Written material given at graduation.   Medication Safety: - Group verbal and visual instruction to review commonly prescribed medications for heart and lung disease. Reviews the medication, class of the drug, and side effects. Includes the steps to properly store meds and maintain the prescription regimen.  Written material given at graduation.   Other: -Provides group and verbal instruction on various topics (see comments)   Knowledge Questionnaire Score:  Knowledge Questionnaire Score - 10/18/22 1252       Knowledge Questionnaire Score   Pre Score 15/18              Core Components/Risk Factors/Patient Goals at Admission:  Personal Goals and Risk Factors at Admission - 10/18/22 1251       Core Components/Risk Factors/Patient Goals on Admission    Weight Management Yes;Weight Loss    Intervention Weight Management: Develop a combined nutrition and exercise program designed to reach desired caloric intake, while maintaining appropriate intake of nutrient and fiber, sodium and fats, and appropriate energy expenditure required for the weight goal.;Weight Management: Provide education and appropriate resources to help participant work on and attain dietary goals.;Weight Management/Obesity: Establish reasonable short term and long term weight goals.;Obesity: Provide education and appropriate resources to help participant work  on and attain dietary goals.    Admit Weight 280 lb 6.4 oz (127.2 kg)    Goal Weight: Short Term 270 lb (122.5 kg)    Goal Weight: Long Term 225 lb (102.1 kg)    Expected Outcomes Short Term: Continue to assess and modify interventions until short term weight is achieved;Long Term:  Adherence to nutrition and physical activity/exercise program aimed toward attainment of established weight goal;Weight Loss: Understanding of general recommendations for a balanced deficit meal plan, which promotes 1-2 lb weight loss per week and includes a negative energy balance of [id number] kcal/d;Understanding recommendations for meals to include 15-35% energy as protein, 25-35% energy from fat, 35-60% energy from carbohydrates, less than 200mg  of dietary cholesterol, 20-35 gm of total fiber daily;Understanding of distribution of calorie intake throughout the day with the consumption of 4-5 meals/snacks    Improve shortness of breath with ADL's Yes    Intervention Provide education, individualized exercise plan and daily activity instruction to help decrease symptoms of SOB with activities of daily living.    Expected Outcomes Short Term: Improve cardiorespiratory fitness to achieve a reduction of symptoms when performing ADLs;Long Term: Be able to perform more ADLs without symptoms or delay the onset of symptoms    Diabetes Yes    Intervention Provide education about signs/symptoms and action to take for hypo/hyperglycemia.;Provide education about proper nutrition, including hydration, and aerobic/resistive exercise prescription along with prescribed medications to achieve blood glucose in normal ranges: Fasting glucose 65-99 mg/dL    Expected Outcomes Short Term: Participant verbalizes understanding of the signs/symptoms and immediate care of hyper/hypoglycemia, proper foot care and importance of medication, aerobic/resistive exercise and nutrition plan for blood glucose control.;Long Term: Attainment of HbA1C < 7%.    Hypertension Yes    Intervention Provide education on lifestyle modifcations including regular physical activity/exercise, weight management, moderate sodium restriction and increased consumption of fresh fruit, vegetables, and low fat dairy, alcohol moderation, and smoking  cessation.;Monitor prescription use compliance.    Expected Outcomes Short Term: Continued assessment and intervention until BP is < 140/27mm HG in hypertensive participants. < 130/63mm HG in hypertensive participants with diabetes, heart failure or chronic kidney disease.;Long Term: Maintenance of blood pressure at goal levels.    Lipids Yes    Intervention Provide education and support for participant on nutrition & aerobic/resistive exercise along with prescribed medications to achieve LDL 70mg , HDL >40mg .    Expected Outcomes Short Term: Participant states understanding of desired cholesterol values and is compliant with medications prescribed. Participant is following exercise prescription and nutrition guidelines.;Long Term: Cholesterol controlled with medications as prescribed, with individualized exercise RX and with personalized nutrition plan. Value goals: LDL < 70mg , HDL > 40 mg.             Education:Diabetes - Individual verbal and written instruction to review signs/symptoms of diabetes, desired ranges of glucose level fasting, after meals and with exercise. Acknowledge that pre and post exercise glucose checks will be done for 3 sessions at entry of program. Flowsheet Row Pulmonary Rehab from 10/18/2022 in [facility] Cardiac and Pulmonary Rehab  Date 10/18/22  Educator [facility]  Instruction Review Code 1- Verbalizes Understanding       Know Your Numbers and Heart Failure: - Group verbal and visual instruction to discuss disease risk factors for cardiac and pulmonary disease and treatment options.  Reviews associated critical values for Overweight/Obesity, Hypertension, Cholesterol, and Diabetes.  Discusses basics of heart failure: signs/symptoms and treatments.  Introduces Heart Failure Zone chart for  action plan for heart failure.  Written material given at graduation.   Core Components/Risk Factors/Patient Goals Review:    Core Components/Risk Factors/Patient Goals at Discharge  (Final Review):    ITP Comments:  ITP Comments     Row Name 10/09/22 1024 10/18/22 1233         ITP Comments Virtual Visit completed. Patient informed on EP and RD appointment and 6 Minute walk test. Patient also informed of patient health questionnaires on My Chart. Patient Verbalizes understanding. Visit diagnosis can be found in [facility] 09/18/2022. Completed and gym orientation. Initial ITP created and sent for review to Dr. Jinny Sanders, Medical Director.               Comments: initial ITP

## 2022-10-19 ENCOUNTER — Encounter: Payer: Medicare Other | Admitting: *Deleted

## 2022-10-19 DIAGNOSIS — R0609 Other forms of dyspnea: Secondary | ICD-10-CM | POA: Diagnosis not present

## 2022-10-19 LAB — GLUCOSE, CAPILLARY
Glucose-Capillary: 206 mg/dL — ABNORMAL HIGH (ref 70–99)
Glucose-Capillary: 135 mg/dL — ABNORMAL HIGH (ref 70–99)

## 2022-10-19 NOTE — Progress Notes (Signed)
Daily Session Note  Patient Details  Name: Nicholas [name] MRN: [id number] Date of Birth: Apr 23, 1945 Referring Provider:   Flowsheet Row Pulmonary Rehab from 10/18/2022 in [facility] Cardiac and Pulmonary Rehab  Referring Provider Dr. Birdena Jubilee       Encounter Date: 10/19/2022  Check In:  Session Check In - 10/19/22 1006       Check-In   Supervising physician immediately available to respond to emergencies See telemetry face sheet for immediately available ER MD    Location [facility]-Cardiac & Pulmonary Rehab    Staff Present Susann Givens, RN BSN;Laureen Manson Passey, BS, RRT, CPFT;Maxon Conetta BS, , Exercise Physiologist;Billal Rollo Katrinka Blazing, RN, ADN    Virtual Visit No    Medication changes reported     No    Fall or balance concerns reported    No    Warm-up and Cool-down Performed on first and last piece of equipment    Resistance Training Performed Yes    VAD Patient? No    PAD/SET Patient? No      Pain Assessment   Currently in Pain? No/denies                Social History   Tobacco Use  Smoking Status Never  Smokeless Tobacco Never    Goals Met:  Independence with exercise equipment Exercise tolerated well No report of concerns or symptoms today Strength training completed today  Goals Unmet:  Not Applicable  Comments: First full day of exercise!  Patient was oriented to gym and equipment including functions, settings, policies, and procedures.  Patient's individual exercise prescription and treatment plan were reviewed.  All starting workloads were established based on the results of the 6 minute walk test done at initial orientation visit.  The plan for exercise progression was also introduced and progression will be customized based on patient's performance and goals.    Dr. Bethann Punches is Medical Director for [facility] Cardiac Rehabilitation.  Dr. Vida Rigger is Medical Director for [facility] Pulmonary Rehabilitation.

## 2022-10-23 ENCOUNTER — Ambulatory Visit (INDEPENDENT_AMBULATORY_CARE_PROVIDER_SITE_OTHER): Payer: Medicare Other | Admitting: Family Medicine

## 2022-10-23 VITALS — BP 109/78 | HR 89 | Temp 98.0°F | Ht 71.65 in | Wt 276.8 lb

## 2022-10-23 DIAGNOSIS — R300 Dysuria: Secondary | ICD-10-CM | POA: Diagnosis not present

## 2022-10-23 DIAGNOSIS — N3001 Acute cystitis with hematuria: Secondary | ICD-10-CM | POA: Diagnosis not present

## 2022-10-23 LAB — URINALYSIS, ROUTINE W REFLEX MICROSCOPIC
Bilirubin, UA: NEGATIVE
Glucose, UA: NEGATIVE
Ketones, UA: NEGATIVE
Nitrite, UA: POSITIVE — AB
Specific Gravity, UA: 1.025 (ref 1.005–1.030)
Urobilinogen, Ur: 0.2 mg/dL (ref 0.2–1.0)
pH, UA: 5.5 (ref 5.0–7.5)

## 2022-10-23 LAB — MICROSCOPIC EXAMINATION

## 2022-10-23 MED ORDER — AMOXICILLIN-POT CLAVULANATE 875-125 MG PO TABS: 1.0000 | ORAL_TABLET | Freq: Two times a day (BID) | ORAL | 0 refills | Status: AC

## 2022-10-23 NOTE — Assessment & Plan Note (Deleted)
Awaiting patient to bring back urine sample. Will treat with Augmentin if positive for leuks,nitrites. Acute, stable. {RP LABS:31091}. Will treat with {RPUTIMEDS:31083}. Recommend daily water hydration to prevent dehydration. Return if symptoms worsen or fail to improve.

## 2022-10-23 NOTE — Assessment & Plan Note (Addendum)
Acute, ongoing. UA positive for leuks and nitrites, awaiting urine culture. Will treat with Augmentin BID for 7 days. Recommend increased daily water intake. Return if symptoms worsen or fail to improve.

## 2022-10-23 NOTE — Progress Notes (Addendum)
BP 109/78   Pulse 89   Temp 98 F (36.7 C) (Oral)   Ht 5' 11.65" (1.82 m)   Wt 276 lb 12.8 oz (125.6 kg)   SpO2 96%   BMI 37.90 kg/m    Subjective:    Patient ID: Nicholas [name], male    DOB: 04/24/1945, 77 y.o.   MRN: [id number]  HPI: Nicholas [name] is a 77 y.o. male  Chief Complaint  Patient presents with   Urinary Incontinence    Functional incontinence started Friday; 3 days ago   URINARY SYMPTOMS Dysuria: burning Urinary frequency: yes Urgency: yes Small volume voids: no Symptom severity: yes Urinary incontinence: yes Foul odor: no Hematuria: no Abdominal pain: yes Back pain: no Suprapubic pain/pressure: yes Flank pain: no Fever:  no Vomiting: no Relief with cranberry juice: mild Relief with pyridium: no Status: better Previous urinary tract infection: yes Recurrent urinary tract infection: no History of sexually transmitted disease: no Penile discharge: no Treatments attempted: increasing fluids to two water bottles daily   Relevant past medical, surgical, family and social history reviewed and updated as indicated. Interim medical history since our last visit reviewed. Allergies and medications reviewed and updated.  Review of Systems  Constitutional:  Negative for fever.  Respiratory: Negative.    Cardiovascular: Negative.   Gastrointestinal:  Positive for abdominal pain (suprapubic pressure).  Genitourinary:  Positive for dysuria and frequency. Negative for decreased urine volume, flank pain, hematuria and urgency.       Functional incontinence    Per HPI unless specifically indicated above     Objective:    BP 109/78   Pulse 89   Temp 98 F (36.7 C) (Oral)   Ht 5' 11.65" (1.82 m)   Wt 276 lb 12.8 oz (125.6 kg)   SpO2 96%   BMI 37.90 kg/m   Wt Readings from Last 3 Encounters:  10/23/22 276 lb 12.8 oz (125.6 kg)  10/18/22 280 lb 6.4 oz (127.2 kg)  10/02/22 284 lb (128.8 kg)    Physical Exam Vitals and nursing note reviewed.   Constitutional:      General: He is awake. He is not in acute distress.    Appearance: He is well-developed and well-groomed. He is obese. He is not ill-appearing.  HENT:     Head: Normocephalic and atraumatic.     Right Ear: Hearing and external ear normal. No drainage.     Left Ear: Hearing and external ear normal. No drainage.     Nose: Nose normal.  Eyes:     General: Lids are normal.        Right eye: No discharge.        Left eye: No discharge.     Conjunctiva/sclera: Conjunctivae normal.  Cardiovascular:     Rate and Rhythm: Normal rate and regular rhythm.     Pulses:          Radial pulses are 2+ on the right side and 2+ on the left side.       Posterior tibial pulses are 2+ on the right side and 2+ on the left side.     Heart sounds: Normal heart sounds, S1 normal and S2 normal. No murmur heard.    No gallop.     Comments: Pacemaker present Pulmonary:     Effort: Pulmonary effort is normal. No accessory muscle usage or respiratory distress.     Breath sounds: Normal breath sounds.  Abdominal:     General: Bowel sounds are  normal.     Tenderness: There is abdominal tenderness in the suprapubic area. There is no right CVA tenderness or left CVA tenderness.  Musculoskeletal:        General: Normal range of motion.     Cervical back: Full passive range of motion without pain and normal range of motion.     Right lower leg: No edema.     Left lower leg: No edema.  Skin:    General: Skin is warm and dry.     Capillary Refill: Capillary refill takes less than 2 seconds.  Neurological:     Mental Status: He is alert and oriented to person, place, and time.  Psychiatric:        Attention and Perception: Attention normal.        Mood and Affect: Mood normal.        Speech: Speech normal.        Behavior: Behavior normal. Behavior is cooperative.        Thought Content: Thought content normal.     Results for orders placed or performed in visit on 10/23/22  Microscopic  Examination   Urine  Result Value Ref Range   WBC, UA 6-10 (A) 0 - 5 /hpf   RBC, Urine 0-2 0 - 2 /hpf   Epithelial Cells (non renal) 0-10 0 - 10 /hpf   Bacteria, UA Many (A) None seen/Few  Urinalysis, Routine w reflex microscopic  Result Value Ref Range   Specific Gravity, UA 1.025 1.005 - 1.030   pH, UA 5.5 5.0 - 7.5   Color, UA Yellow Yellow   Appearance Ur Cloudy (A) Clear   Leukocytes,UA Trace (A) Negative   Protein,UA Trace (A) Negative/Trace   Glucose, UA Negative Negative   Ketones, UA Negative Negative   RBC, UA Trace (A) Negative   Bilirubin, UA Negative Negative   Urobilinogen, Ur 0.2 0.2 - 1.0 mg/dL   Nitrite, UA Positive (A) Negative   Microscopic Examination See below:       Assessment & Plan:   Problem List Items Addressed This Visit     Dysuria - Primary   Relevant Orders   Urinalysis, Routine w reflex microscopic (Completed)   Urine Culture   Acute cystitis with hematuria    Acute, ongoing. UA positive for leuks and nitrites, awaiting urine culture. Will treat with Augmentin BID for 7 days. Recommend increased daily water intake. Return if symptoms worsen or fail to improve.          Follow up plan: Return if symptoms worsen or fail to improve.

## 2022-10-24 ENCOUNTER — Encounter: Payer: Medicare Other | Attending: Internal Medicine | Admitting: *Deleted

## 2022-10-24 DIAGNOSIS — R0609 Other forms of dyspnea: Secondary | ICD-10-CM | POA: Diagnosis present

## 2022-10-24 DIAGNOSIS — I2724 Chronic thromboembolic pulmonary hypertension: Secondary | ICD-10-CM | POA: Insufficient documentation

## 2022-10-24 LAB — GLUCOSE, CAPILLARY
Glucose-Capillary: 216 mg/dL — ABNORMAL HIGH (ref 70–99)
Glucose-Capillary: 141 mg/dL — ABNORMAL HIGH (ref 70–99)

## 2022-10-24 NOTE — Progress Notes (Signed)
Daily Session Note  Patient Details  Name: Nicholas [name] MRN: [id number] Date of Birth: 06-Sep-1945 Referring Provider:   Flowsheet Row Pulmonary Rehab from 10/18/2022 in [facility] Cardiac and Pulmonary Rehab  Referring Provider Dr. Birdena Jubilee       Encounter Date: 10/24/2022  Check In:  Session Check In - 10/24/22 1009       Check-In   Supervising physician immediately available to respond to emergencies See telemetry face sheet for immediately available ER MD    Location [facility]-Cardiac & Pulmonary Rehab    Staff Present Cora Collum, RN, BSN, CCRP;Noah Tickle, BS, Exercise Physiologist;Maxon Conetta BS, , Exercise Physiologist    Virtual Visit No    Medication changes reported     No    Fall or balance concerns reported    No    Warm-up and Cool-down Performed on first and last piece of equipment    Resistance Training Performed Yes    VAD Patient? No    PAD/SET Patient? No      Pain Assessment   Currently in Pain? No/denies                Social History   Tobacco Use  Smoking Status Never  Smokeless Tobacco Never    Goals Met:  Proper associated with RPD/PD & O2 Sat Independence with exercise equipment Exercise tolerated well No report of concerns or symptoms today  Goals Unmet:  Not Applicable  Comments: Pt able to follow exercise prescription today without complaint.  Will continue to monitor for progression.    Dr. Bethann Punches is Medical Director for [facility] Cardiac Rehabilitation.  Dr. Vida Rigger is Medical Director for [facility] Pulmonary Rehabilitation.

## 2022-10-25 ENCOUNTER — Encounter: Payer: Self-pay | Admitting: *Deleted

## 2022-10-25 DIAGNOSIS — R0609 Other forms of dyspnea: Secondary | ICD-10-CM

## 2022-10-25 LAB — URINE CULTURE

## 2022-10-25 NOTE — Progress Notes (Signed)
Pulmonary Individual Treatment Plan  Patient Details  Name: CHIP DAVIN MRN: [id number] Date of Birth: [birth year] Referring Provider:   Flowsheet Row Pulmonary Rehab from 10/18/2022 in [facility] Cardiac and Pulmonary Rehab  Referring Provider Dr. Birdena Jubilee       Initial Encounter Date:  Flowsheet Row Pulmonary Rehab from 10/18/2022 in [facility] Cardiac and Pulmonary Rehab  Date 10/18/22       Visit Diagnosis: DOE (dyspnea on exertion)  Patient's Home Medications on Admission:  Current Outpatient Medications:    acetaminophen (TYLENOL) 650 MG CR tablet, Take 650 mg by mouth every 8 (eight) hours as needed for pain., Disp: , Rfl:    albuterol (VENTOLIN HFA) 108 (90 Base) MCG/ACT inhaler, TAKE 2 PUFFS BY MOUTH EVERY 6 HOURS AS NEEDED, Disp: 6.7 each, Rfl: 0   amoxicillin-clavulanate (AUGMENTIN) 875-125 MG tablet, Take 1 tablet by mouth 2 (two) times daily for 7 days., Disp: 14 tablet, Rfl: 0   Ascorbic Acid (VITAMIN C) 1000 MG tablet, Take 1,000 mg by mouth daily., Disp: , Rfl:    Blood Glucose Monitoring Suppl (ONETOUCH VERIO) w/Device KIT, Use to check blood sugar 3 times a day and document results, bring to appointments.  Goal is <130 fasting blood sugar and <180 two hours after meals., Disp: 1 kit, Rfl: 0   calcium-vitamin D (OSCAL WITH D) 500-5 MG-MCG tablet, Take 1 tablet by mouth daily with breakfast., Disp: , Rfl:    Cranberry 500 MG CAPS, Take 500 mg by mouth daily., Disp: , Rfl:    ELIQUIS 5 MG TABS tablet, TAKE 1 TABLET BY MOUTH TWICE A DAY, Disp: 180 tablet, Rfl: 1   empagliflozin (JARDIANCE) 25 MG TABS tablet, Take 1 tablet (25 mg total) by mouth daily., Disp: 30 tablet, Rfl: 4   gabapentin (NEURONTIN) 600 MG tablet, TAKE 1 TABLET BY MOUTH EVERYDAY AT BEDTIME, Disp: 90 tablet, Rfl: 4   glucose blood test strip, Use to check blood sugar 3 times a day and document results, bring to appointments.  Goal is <130 fasting blood sugar and <180 two hours after meals., Disp: 100 each,  Rfl: 12   HYDROcodone-acetaminophen (NORCO/VICODIN) 5-325 MG tablet, Take 1 tablet by mouth 2 (two) times daily., Disp: 60 tablet, Rfl: 0   [START ON 11/16/2022] HYDROcodone-acetaminophen (NORCO/VICODIN) 5-325 MG tablet, Take 1 tablet by mouth 2 (two) times daily., Disp: 60 tablet, Rfl: 0   Lancets (ONETOUCH ULTRASOFT) lancets, Use to check blood sugar 3 times a day and document results, bring to appointments.  Goal is <130 fasting blood sugar and <180 two hours after meals., Disp: 100 each, Rfl: 12   LORazepam (ATIVAN) 1 MG tablet, TAKE 1 TABLET BY MOUTH EVERYDAY AT BEDTIME, Disp: 90 tablet, Rfl: 0   Magnesium 400 MG TABS, Take 400 mg by mouth daily. , Disp: , Rfl:    MELATONIN PO, Take by mouth., Disp: , Rfl:    midodrine (PROAMATINE) 10 MG tablet, Take 0.5 tablets (5 mg total) by mouth 3 (three) times daily., Disp: 135 tablet, Rfl: 0   Multiple Vitamin (MULTIVITAMIN WITH MINERALS) TABS tablet, Take 1 tablet by mouth daily. Centrum Silver, Disp: , Rfl:    mupirocin ointment (BACTROBAN) 2 %, Apply 1 Application topically 2 (two) times daily., Disp: 22 g, Rfl: 0   nystatin (MYCOSTATIN/NYSTOP) powder, Apply 1 Application topically 3 (three) times daily., Disp: 60 g, Rfl: 2   nystatin cream (MYCOSTATIN), Apply 1 Application topically 2 (two) times daily., Disp: 30 g, Rfl: 4   Omega-3  Fatty Acids (FISH OIL) 1200 MG CAPS, Take 1,200 mg by mouth daily., Disp: , Rfl:    oxymetazoline (AFRIN) 0.05 % nasal spray, Place 2 sprays into both nostrils at bedtime., Disp: , Rfl:    pantoprazole (PROTONIX) 20 MG tablet, TAKE 1 TABLET (20 MG TOTAL) BY MOUTH 2 (TWO) TIMES DAILY BEFORE A MEAL. (Patient taking differently: Take 20 mg by mouth daily.), Disp: 180 tablet, Rfl: 2   potassium chloride SA (KLOR-CON M) 20 MEQ tablet, Take 2 tablets (40 meq) with Torsemide, Disp: 30 tablet, Rfl: 3   QUEtiapine Fumarate (SEROQUEL XR) 150 MG 24 hr tablet, TAKE 1 TABLET BY MOUTH EVERYDAY AT BEDTIME, Disp: 90 tablet, Rfl: 4    rOPINIRole (REQUIP) 0.25 MG tablet, Take 0.25 mg by mouth 4 (four) times daily., Disp: , Rfl:    rosuvastatin (CRESTOR) 40 MG tablet, TAKE 1 TABLET BY MOUTH EVERY DAY, Disp: 90 tablet, Rfl: 1   solifenacin (VESICARE) 10 MG tablet, TAKE 1 TABLET BY MOUTH EVERY DAY, Disp: 90 tablet, Rfl: 3   tiZANidine (ZANAFLEX) 4 MG tablet, Take 1 tablet (4 mg total) by mouth every 6 (six) hours as needed for muscle spasms., Disp: 90 tablet, Rfl: 4   torsemide (DEMADEX) 20 MG tablet, TAKE 2 TABLETS (40 MG TOTAL) BY MOUTH DAILY AS NEEDED (FLUID RETENTION)., Disp: 90 tablet, Rfl: 0   venlafaxine XR (EFFEXOR-XR) 75 MG 24 hr capsule, TAKE 3 CAPSULES BY MOUTH EVERY DAY, Disp: 270 capsule, Rfl: 0  Past Medical History: Past Medical History:  Diagnosis Date   Abdominal pain, chronic, right lower quadrant 12/13/2020   Anterior urethral stricture    Anxiety    Arthritis    a. knees, hips, hands;  b. 11/2013 s/p L TKA @ [facility].   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 Dr Graciela Husbands   Depression    DVT (deep venous thrombosis) (HCC)    Erosive esophagitis    Gross hematuria    Hyperlipemia    Hypertension    borderline   Internal hemorrhoids    Phimosis    Pulmonary embolism (HCC)     Tobacco Use: Social History   Tobacco Use  Smoking Status Never  Smokeless Tobacco Never    Labs: Review Flowsheet  More data exists      Latest Ref Rng & Units 08/24/2021 11/29/2021 03/01/2022 05/30/2022 10/02/2022  Labs for ITP Cardiac and Pulmonary Rehab  Cholestrol 100 - 199 mg/dL 409  811  914  782  956   LDL (calc) 0 - 99 mg/dL 59  63  58  59  213   HDL-C >39 mg/dL 55  49  58  49  42   Trlycerides 0 - 149 mg/dL 77  086  97  578  469   Hemoglobin A1c 4.8 - 5.6 % 5.9  6.8  6.8  7.0  7.0     Details             Pulmonary Assessment Scores:  Pulmonary Assessment Scores     Row Name  10/18/22 1252         ADL UCSD   ADL Phase Entry     SOB Score total 89     Rest 0     Walk 5     Stairs 5     Bath 5  Dress 4     Shop 5       CAT Score   CAT Score 20       mMRC Score   mMRC Score 3              UCSD: Self-administered rating of dyspnea associated with activities of daily living (ADLs) 6-point scale (0 = "not at all" to 5 = "maximal or unable to do because of breathlessness")  Scoring Scores range from 0 to 120.  Minimally important difference is 5 units  CAT: CAT can identify the health impairment of COPD patients and is better correlated with disease progression.  CAT has a scoring range of zero to 40. The CAT score is classified into four groups of low (less than 10), medium (10 - 20), high (21-30) and very high (31-40) based on the impact level of disease on health status. A CAT score over 10 suggests significant symptoms.  A worsening CAT score could be explained by an exacerbation, poor medication adherence, poor inhaler technique, or progression of COPD or comorbid conditions.  CAT MCID is 2 points  mMRC: mMRC (Modified Medical Research Council) Dyspnea Scale is used to assess the degree of baseline functional disability in patients of respiratory disease due to dyspnea. No minimal important difference is established. A decrease in score of 1 point or greater is considered a positive change.   Pulmonary Function Assessment:  Pulmonary Function Assessment - 10/09/22 1023       Breath   Shortness of Breath Yes;Limiting activity             Exercise Target Goals: Exercise Program Goal: Individual exercise prescription set using results from initial 6 min walk test and THRR while considering  patient's activity barriers and safety.   Exercise Prescription Goal: Initial exercise prescription builds to 30-45 minutes a day of aerobic activity, 2-3 days per week.  Home exercise guidelines will be given to patient during program as part of  exercise prescription that the participant will acknowledge.  Education: Aerobic Exercise: - Group verbal and visual presentation on the components of exercise prescription. Introduces F.I.T.T principle from ACSM for exercise prescriptions.  Reviews F.I.T.T. principles of aerobic exercise including progression. Written material given at graduation.   Education: Resistance Exercise: - Group verbal and visual presentation on the components of exercise prescription. Introduces F.I.T.T principle from ACSM for exercise prescriptions  Reviews F.I.T.T. principles of resistance exercise including progression. Written material given at graduation.    Education: Exercise & Equipment Safety: - Individual verbal instruction and demonstration of equipment use and safety with use of the equipment. Flowsheet Row Pulmonary Rehab from 10/19/2022 in [facility] Cardiac and Pulmonary Rehab  Date 10/18/22  Educator [facility]  Instruction Review Code 1- Verbalizes Understanding       Education: Exercise Physiology & General Exercise Guidelines: - Group verbal and written instruction with models to review the exercise physiology of the cardiovascular system and associated critical values. Provides general exercise guidelines with specific guidelines to those with heart or lung disease.    Education: Flexibility, Balance, Mind/Body Relaxation: - Group verbal and visual presentation with interactive activity on the components of exercise prescription. Introduces F.I.T.T principle from ACSM for exercise prescriptions. Reviews F.I.T.T. principles of flexibility and balance exercise training including progression. Also discusses the mind body connection.  Reviews various relaxation techniques to help reduce and manage stress (i.e. Deep breathing, progressive muscle relaxation, and visualization). Balance handout provided to take home. Written material given at graduation.  Activity Barriers & Risk Stratification:  Activity  Barriers & Cardiac Risk Stratification - 10/18/22 1236       Activity Barriers & Cardiac Risk Stratification   Activity Barriers Right Hip Replacement;Left Knee Replacement;Left Hip Replacement;Right Knee Replacement             6 Minute Walk:  6 Minute Walk     Row Name 10/18/22 1233         6 Minute Walk   Phase Initial     Distance 825 feet     Walk Time 6 minutes     # of Rest Breaks 0     MPH 1.56     METS 1.07     RPE 13     Perceived Dyspnea  3     VO2 Peak 3.76     Symptoms Yes (comment)     Comments SOB     Resting HR 99 bpm     Resting BP 110/60     Resting Oxygen Saturation  95 %     Exercise Oxygen Saturation  during 6 min walk 93 %     Max Ex. HR 107 bpm     Max Ex. BP 128/70     2 Minute Post BP 118/76       Interval HR   1 Minute HR 81     2 Minute HR 70     3 Minute HR 84     4 Minute HR 82     5 Minute HR 77     6 Minute HR 107     2 Minute Post HR 102     Interval Heart Rate? Yes       Interval Oxygen   Interval Oxygen? Yes     Baseline Oxygen Saturation % 95 %     1 Minute Oxygen Saturation % 93 %     1 Minute Liters of Oxygen 0 L     2 Minute Oxygen Saturation % 94 %     2 Minute Liters of Oxygen 0 L     3 Minute Oxygen Saturation % 93 %     3 Minute Liters of Oxygen 0 L     4 Minute Oxygen Saturation % 93 %     4 Minute Liters of Oxygen 0 L     5 Minute Oxygen Saturation % 94 %     5 Minute Liters of Oxygen 0 L     6 Minute Oxygen Saturation % 94 %     6 Minute Liters of Oxygen 0 L     2 Minute Post Oxygen Saturation % 94 %     2 Minute Post Liters of Oxygen 0 L             Oxygen Initial Assessment:  Oxygen Initial Assessment - 10/18/22 1252       Home Oxygen   Home Oxygen Device None    Sleep Oxygen Prescription CPAP    Liters per minute 0    Home Exercise Oxygen Prescription None    Home Resting Oxygen Prescription None    Compliance with Home Oxygen Use Yes      Initial 6 min Walk   Oxygen Used None       Program Oxygen Prescription   Program Oxygen Prescription None      Intervention   Short Term Goals To learn and exhibit compliance with exercise, home and travel O2 prescription;To learn and understand importance of  monitoring SPO2 with pulse oximeter and demonstrate accurate use of the pulse oximeter.;To learn and understand importance of maintaining oxygen saturations>88%;To learn and demonstrate proper pursed lip breathing techniques or other breathing techniques. ;To learn and demonstrate proper use of respiratory medications    Long  Term Goals Exhibits compliance with exercise, home  and travel O2 prescription;Verbalizes importance of monitoring SPO2 with pulse oximeter and return demonstration;Maintenance of O2 saturations>88%;Exhibits proper breathing techniques, such as pursed lip breathing or other method taught during program session;Compliance with respiratory medication;Demonstrates proper use of MDI's             Oxygen Re-Evaluation:   Oxygen Discharge (Final Oxygen Re-Evaluation):   Initial Exercise Prescription:  Initial Exercise Prescription - 10/18/22 1200       Date of Initial Exercise RX and Referring Provider   Date 10/18/22    Referring Provider Dr. Birdena Jubilee      Oxygen   Maintain Oxygen Saturation 88% or higher      NuStep   Level 1   T6   SPM 80    Minutes 15    METs 1.07      REL-XR   Level 1    Speed 50    Minutes 15    METs 1.07      Track   Laps 10    Minutes 15    METs 1.54      Prescription Details   Frequency (times per week) 2    Duration Progress to 30 minutes of continuous aerobic without signs/symptoms of physical distress      Intensity   THRR 40-80% of Max Heartrate 116-134    Ratings of Perceived Exertion 11-13    Perceived Dyspnea 0-4      Progression   Progression Continue to progress workloads to maintain intensity without signs/symptoms of physical distress.      Resistance Training   Training  Prescription Yes    Weight 3    Reps 10-15             Perform Capillary Blood Glucose checks as needed.  Exercise Prescription Changes:   Exercise Prescription Changes     Row Name 10/18/22 1200 10/19/22 1000           Response to Exercise   Blood Pressure (Admit) 110/60 110/60      Blood Pressure (Exercise) 128/70 128/70      Blood Pressure (Exit) 118/76 118/76      Heart Rate (Admit) 99 bpm 99 bpm      Heart Rate (Exercise) 107 bpm 107 bpm      Heart Rate (Exit) 92 bpm 92 bpm      Oxygen Saturation (Admit) 95 % 95 %      Oxygen Saturation (Exercise) 93 % 93 %      Oxygen Saturation (Exit) 94 % 94 %      Rating of Perceived Exertion (Exercise) 13 13      Perceived Dyspnea (Exercise) 3 3      Symptoms SOB SOB      Comments 6 MWT results 6 MWT results        Resistance Training   Training Prescription -- Yes      Weight -- 3      Reps -- 10-15        NuStep   Level -- 1  T6      SPM -- 80      Minutes -- 15  METs -- 1.07        REL-XR   Level -- 1      Speed -- 50      Minutes -- 15      METs -- 1.07        Track   Laps -- 10      Minutes -- 15      METs -- 1.54               Exercise Comments:   Exercise Comments     Row Name 10/19/22 1007           Exercise Comments First full day of exercise!  Patient was oriented to gym and equipment including functions, settings, policies, and procedures.  Patient's individual exercise prescription and treatment plan were reviewed.  All starting workloads were established based on the results of the 6 minute walk test done at initial orientation visit.  The plan for exercise progression was also introduced and progression will be customized based on patient's performance and goals.                Exercise Goals and Review:   Exercise Goals     Row Name 10/18/22 1247             Exercise Goals   Increase Physical Activity Yes       Intervention Provide advice, education, support and  counseling about physical activity/exercise needs.;Develop an individualized exercise prescription for aerobic and resistive training based on initial evaluation findings, risk stratification, comorbidities and participant's personal goals.       Expected Outcomes Short Term: Attend rehab on a regular basis to increase amount of physical activity.;Long Term: Add in home exercise to make exercise part of routine and to increase amount of physical activity.;Long Term: Exercising regularly at least 3-5 days a week.       Increase Strength and Stamina Yes       Intervention Provide advice, education, support and counseling about physical activity/exercise needs.;Develop an individualized exercise prescription for aerobic and resistive training based on initial evaluation findings, risk stratification, comorbidities and participant's personal goals.       Expected Outcomes Short Term: Increase workloads from initial exercise prescription for resistance, speed, and METs.;Short Term: Perform resistance training exercises routinely during rehab and add in resistance training at home;Long Term: Improve cardiorespiratory fitness, muscular endurance and strength as measured by increased METs and functional capacity ( )       Able to understand and use rate of perceived exertion (RPE) scale Yes       Intervention Provide education and explanation on how to use RPE scale       Expected Outcomes Short Term: Able to use RPE daily in rehab to express subjective intensity level;Long Term:  Able to use RPE to guide intensity level when exercising independently       Able to understand and use Dyspnea scale Yes       Intervention Provide education and explanation on how to use Dyspnea scale       Expected Outcomes Short Term: Able to use Dyspnea scale daily in rehab to express subjective sense of shortness of breath during exertion;Long Term: Able to use Dyspnea scale to guide intensity level when exercising independently        Knowledge and understanding of Target Heart Rate Range (THRR) Yes       Intervention Provide education and explanation of THRR including how the numbers were predicted and where  they are located for reference       Expected Outcomes Short Term: Able to state/look up THRR;Long Term: Able to use THRR to govern intensity when exercising independently;Short Term: Able to use daily as guideline for intensity in rehab       Able to check pulse independently Yes       Intervention Provide education and demonstration on how to check pulse in carotid and radial arteries.;Review the importance of being able to check your own pulse for safety during independent exercise       Expected Outcomes Short Term: Able to explain why pulse checking is important during independent exercise;Long Term: Able to check pulse independently and accurately       Understanding of Exercise Prescription Yes       Intervention Provide education, explanation, and written materials on patient's individual exercise prescription       Expected Outcomes Short Term: Able to explain program exercise prescription;Long Term: Able to explain home exercise prescription to exercise independently                Exercise Goals Re-Evaluation :  Exercise Goals Re-Evaluation     Row Name 10/19/22 1007             Exercise Goal Re-Evaluation   Exercise Goals Review Able to understand and use rate of perceived exertion (RPE) scale;Able to understand and use Dyspnea scale;Knowledge and understanding of Target Heart Rate Range (THRR);Understanding of Exercise Prescription       Comments Reviewed RPE and dyspnea scale, THR and program prescription with pt today.  Pt voiced understanding and was given a copy of goals to take home.       Expected Outcomes Short: Use RPE daily to regulate intensity. Long: Follow program prescription in THR.                Discharge Exercise Prescription (Final Exercise Prescription Changes):   Exercise Prescription Changes - 10/19/22 1000       Response to Exercise   Blood Pressure (Admit) 110/60    Blood Pressure (Exercise) 128/70    Blood Pressure (Exit) 118/76    Heart Rate (Admit) 99 bpm    Heart Rate (Exercise) 107 bpm    Heart Rate (Exit) 92 bpm    Oxygen Saturation (Admit) 95 %    Oxygen Saturation (Exercise) 93 %    Oxygen Saturation (Exit) 94 %    Rating of Perceived Exertion (Exercise) 13    Perceived Dyspnea (Exercise) 3    Symptoms SOB    Comments 6 MWT results      Resistance Training   Training Prescription Yes    Weight 3    Reps 10-15      NuStep   Level 1   T6   SPM 80    Minutes 15    METs 1.07      REL-XR   Level 1    Speed 50    Minutes 15    METs 1.07      Track   Laps 10    Minutes 15    METs 1.54             Nutrition:  Target Goals: Understanding of nutrition guidelines, daily intake of sodium 1500mg , cholesterol 200mg , calories 30% from fat and 7% or less from saturated fats, daily to have 5 or more servings of fruits and vegetables.  Education: All About Nutrition: -Group instruction provided by verbal, written material, interactive activities, discussions,  models, and posters to present general guidelines for heart healthy nutrition including fat, fiber, MyPlate, the role of sodium in heart healthy nutrition, utilization of the nutrition label, and utilization of this knowledge for meal planning. Follow up email sent as well. Written material given at graduation.   Biometrics:  Pre Biometrics - 10/18/22 1248       Pre Biometrics   Height 5' 11.5" (1.816 m)    Weight 280 lb 6.4 oz (127.2 kg)    Waist Circumference 51 inches    Hip Circumference 53.5 inches    Waist to Hip Ratio 0.95 %    BMI (Calculated) 38.57    Single Leg Stand 1.6 seconds              Nutrition Therapy Plan and Nutrition Goals:  Nutrition Therapy & Goals - 10/18/22 1319       Nutrition Therapy   Diet Cardiac, Low Na    Protein  (specify units) 75    Fiber 30 grams    Whole Grain Foods 3 servings    Saturated Fats 15 max. grams    Fruits and Vegetables 5 servings/day    Sodium 2 grams      Personal Nutrition Goals   Nutrition Goal Drink 2 bottles of water daily    Personal Goal #2 Eat something nutrient/calorie dense at every meal hour    Personal Goal #3 Pair a complex carb with a protein or healthy fat    Comments Patient drinking ~20oz of water daily. Spoke about the importance of hydration and set short term goal of drinking 2 bottles per day (~32oz), with a long term goal of ~64oz daily. He reports that he likes to snack on sweets, but his appetite has been less than it used to be, often not missing a meal or getting full quickly. Reviewed mediterranean diet handout, educated on types of fats, sources, how to read food label. Educated on how healthy fats can be heart healthy and high in calories. Explain how choosing calorie dense foods such as peanut butter, olives, olives or avocado oils. Reviewed his 24hr food recall, explained how he should look to pair complex carbs with a protein or healthy fats when making meals or snacks. Brainstormed and built out a few example meals and snacks focusing on getting enough nutrition with his poor appetite.      Intervention Plan   Intervention Prescribe, educate and counsel regarding individualized specific dietary modifications aiming towards targeted core components such as weight, hypertension, lipid management, diabetes, heart failure and other comorbidities.;Nutrition handout(s) given to patient.    Expected Outcomes Short Term Goal: Understand basic principles of dietary content, such as calories, fat, sodium, cholesterol and nutrients.;Short Term Goal: A plan has been developed with personal nutrition goals set during dietitian appointment.;Long Term Goal: Adherence to prescribed nutrition plan.             Nutrition Assessments:  MEDIFICTS Score Key: >=70 Need  to make dietary changes  40-70 Heart Healthy Diet <= 40 Therapeutic Level Cholesterol Diet  Flowsheet Row Pulmonary Rehab from 10/18/2022 in [facility] Cardiac and Pulmonary Rehab  Picture Your Plate Total Score on Admission 58      Picture Your Plate Scores: <16 Unhealthy dietary pattern with much room for improvement. 41-50 Dietary pattern unlikely to meet recommendations for good health and room for improvement. 51-60 More healthful dietary pattern, with some room for improvement.  >60 Healthy dietary pattern, although there may be some specific behaviors that  could be improved.   Nutrition Goals Re-Evaluation:   Nutrition Goals Discharge (Final Nutrition Goals Re-Evaluation):   Psychosocial: Target Goals: Acknowledge presence or absence of significant depression and/or stress, maximize coping skills, provide positive support system. Participant is able to verbalize types and ability to use techniques and skills needed for reducing stress and depression.   Education: Stress, Anxiety, and Depression - Group verbal and visual presentation to define topics covered.  Reviews how body is impacted by stress, anxiety, and depression.  Also discusses healthy ways to reduce stress and to treat/manage anxiety and depression.  Written material given at graduation.   Education: Sleep Hygiene -Provides group verbal and written instruction about how sleep can affect your health.  Define sleep hygiene, discuss sleep cycles and impact of sleep habits. Review good sleep hygiene tips.    Initial Review & Psychosocial Screening:  Initial Psych Review & Screening - 10/09/22 1025       Initial Review   Current issues with Current Psychotropic Meds;Current Sleep Concerns      Family Dynamics   Good Support System? Yes    Comments Orvilla Fus has a problem with his son and is not on speaking. He can look to his wife as a great support system. His sleep has been doing well and takes medication.       Barriers   Psychosocial barriers to participate in program The patient should benefit from training in stress management and relaxation.      Screening Interventions   Interventions Encouraged to exercise;To provide support and resources with identified psychosocial needs;Provide feedback about the scores to participant    Expected Outcomes Short Term goal: Utilizing psychosocial counselor, staff and physician to assist with identification of specific Stressors or current issues interfering with healing process. Setting desired goal for each stressor or current issue identified.;Long Term Goal: Stressors or current issues are controlled or eliminated.;Short Term goal: Identification and review with participant of any Quality of Life or Depression concerns found by scoring the questionnaire.;Long Term goal: The participant improves quality of Life and PHQ9 Scores as seen by post scores and/or verbalization of changes             Quality of Life Scores:  Scores of 19 and below usually indicate a poorer quality of life in these areas.  A difference of  2-3 points is a clinically meaningful difference.  A difference of 2-3 points in the total score of the Quality of Life Index has been associated with significant improvement in overall quality of life, self-image, physical symptoms, and general health in studies assessing change in quality of life.  PHQ-9: Review Flowsheet  More data exists      10/23/2022 10/18/2022 10/02/2022 06/06/2022 04/26/2022  Depression screen PHQ 2/9  Decreased Interest 1 1 1 1 2   Down, Depressed, Hopeless 1 1 1 1 2   PHQ - 2 Score 2 2 2 2 4   Altered sleeping 1 0 0 1 3  Tired, decreased energy 3 3 3 1 3   Change in appetite 1 1 0 0 0  Feeling bad or failure about yourself  1 2 0 0 2  Trouble concentrating 0 0 0 0 0  Moving slowly or fidgety/restless 1 0 0 0 1  Suicidal thoughts 0 1 0 0 0  PHQ-9 Score 9 9 5 4 13   Difficult doing work/chores Very difficult Very difficult  Somewhat difficult - -    Details  Interpretation of Total Score  Total Score Depression Severity:  1-4 = Minimal depression, 5-9 = Mild depression, 10-14 = Moderate depression, 15-19 = Moderately severe depression, 20-27 = Severe depression   Psychosocial Evaluation and Intervention:  Psychosocial Evaluation - 10/09/22 1027       Psychosocial Evaluation & Interventions   Interventions Relaxation education;Encouraged to exercise with the program and follow exercise prescription;Stress management education    Comments Orvilla Fus has a problem with his son and is not on speaking. He can look to his wife as a great support system. His sleep has been doing well and takes medication.    Expected Outcomes Short: Start [facility] to help with mood. Long: Maintain a healthy mental state.    Continue Psychosocial Services  Follow up required by staff             Psychosocial Re-Evaluation:   Psychosocial Discharge (Final Psychosocial Re-Evaluation):   Education: Education Goals: Education classes will be provided on a weekly basis, covering required topics. Participant will state understanding/return demonstration of topics presented.  Learning Barriers/Preferences:  Learning Barriers/Preferences - 10/09/22 1022       Learning Barriers/Preferences   Learning Barriers None    Learning Preferences None             General Pulmonary Education Topics:  Infection Prevention: - Provides verbal and written material to individual with discussion of infection control including proper hand washing and proper equipment cleaning during exercise session. Flowsheet Row Pulmonary Rehab from 10/19/2022 in [facility] Cardiac and Pulmonary Rehab  Date 10/18/22  Educator [facility]  Instruction Review Code 1- Verbalizes Understanding       Falls Prevention: - Provides verbal and written material to individual with discussion of falls prevention and safety. Flowsheet Row Pulmonary Rehab from  10/19/2022 in [facility] Card[facility] Pulmonary Rehab  Date 10/18/22  Educator [facility]  Instruction Review Code 1- Verbalizes Understanding       Chronic Lung Disease Review: - Group verbal instruction with posters, models, PowerPoint presentations and videos,  to review new updates, new respiratory medications, new advancements in procedures and treatments. Providing information on websites and "800" numbers for continued self-education. Includes information about supplement oxygen, available portable oxygen systems, continuous and intermittent flow rates, oxygen safety, concentrators, and Medicare reimbursement for oxygen. Explanation of Pulmonary Drugs, including class, frequency, complications, importance of spacers, rinsing mouth after steroid MDI's, and proper cleaning methods for nebulizers. Review of basic lung anatomy and physiology related to function, structure, and complications of lung disease. Review of risk factors. Discussion about methods for diagnosing sleep apnea and types of masks and machines for OSA. Includes a review of the use of types of environmental controls: home humidity, furnaces, filters, dust mite/pet prevention, HEPA vacuums. Discussion about weather changes, air quality and the benefits of nasal washing. Instruction on Warning signs, infection symptoms, calling MD promptly, preventive modes, and value of vaccinations. Review of effective airway clearance, coughing and/or vibration techniques. Emphasizing that all should Create an Action Plan. Written material given at graduation. Flowsheet Row Pulmonary Rehab from 10/19/2022 in [facility] Cardiac and Pulmonary Rehab  Education need identified 10/18/22  Date 10/19/22  Educator [facility]  Instruction Review Code 1- Verbalizes Understanding       AED/CPR: - Group verbal and written instruction with the use of models to demonstrate the basic use of the AED with the basic ABC's of resuscitation.    Anatomy and Cardiac Procedures: - Group  verbal and visual presentation and models provide information about  basic cardiac anatomy and function. Reviews the testing methods done to diagnose heart disease and the outcomes of the test results. Describes the treatment choices: Medical Management, Angioplasty, or Coronary Bypass Surgery for treating various heart conditions including Myocardial Infarction, Angina, Valve Disease, and Cardiac Arrhythmias.  Written material given at graduation.   Medication Safety: - Group verbal and visual instruction to review commonly prescribed medications for heart and lung disease. Reviews the medication, class of the drug, and side effects. Includes the steps to properly store meds and maintain the prescription regimen.  Written material given at graduation.   Other: -Provides group and verbal instruction on various topics (see comments)   Knowledge Questionnaire Score:  Knowledge Questionnaire Score - 10/18/22 1252       Knowledge Questionnaire Score   Pre Score 15/18              Core Components/Risk Factors/Patient Goals at Admission:  Personal Goals and Risk Factors at Admission - 10/18/22 1251       Core Components/Risk Factors/Patient Goals on Admission    Weight Management Yes;Weight Loss    Intervention Weight Management: Develop a combined nutrition and exercise program designed to reach desired caloric intake, while maintaining appropriate intake of nutrient and fiber, sodium and fats, and appropriate energy expenditure required for the weight goal.;Weight Management: Provide education and appropriate resources to help participant work on and attain dietary goals.;Weight Management/Obesity: Establish reasonable short term and long term weight goals.;Obesity: Provide education and appropriate resources to help participant work on and attain dietary goals.    Admit Weight 280 lb 6.4 oz (127.2 kg)    Goal Weight: Short Term 270 lb (122.5 kg)    Goal Weight: Long Term 225 lb (102.1  kg)    Expected Outcomes Short Term: Continue to assess and modify interventions until short term weight is achieved;Long Term: Adherence to nutrition and physical activity/exercise program aimed toward attainment of established weight goal;Weight Loss: Understanding of general recommendations for a balanced deficit meal plan, which promotes 1-2 lb weight loss per week and includes a negative energy balance of [phone] kcal/d;Understanding recommendations for meals to include 15-35% energy as protein, 25-35% energy from fat, 35-60% energy from carbohydrates, less than 200mg  of dietary cholesterol, 20-35 gm of total fiber daily;Understanding of distribution of calorie intake throughout the day with the consumption of 4-5 meals/snacks    Improve shortness of breath with ADL's Yes    Intervention Provide education, individualized exercise plan and daily activity instruction to help decrease symptoms of SOB with activities of daily living.    Expected Outcomes Short Term: Improve cardiorespiratory fitness to achieve a reduction of symptoms when performing ADLs;Long Term: Be able to perform more ADLs without symptoms or delay the onset of symptoms    Diabetes Yes    Intervention Provide education about signs/symptoms and action to take for hypo/hyperglycemia.;Provide education about proper nutrition, including hydration, and aerobic/resistive exercise prescription along with prescribed medications to achieve blood glucose in normal ranges: Fasting glucose 65-99 mg/dL    Expected Outcomes Short Term: Participant verbalizes understanding of the signs/symptoms and immediate care of hyper/hypoglycemia, proper foot care and importance of medication, aerobic/resistive exercise and nutrition plan for blood glucose control.;Long Term: Attainment of HbA1C < 7%.    Hypertension Yes    Intervention Provide education on lifestyle modifcations including regular physical activity/exercise, weight management, moderate sodium  restriction and increased consumption of fresh fruit, vegetables, and low fat dairy, alcohol moderation, and smoking cessation.;Monitor prescription use compliance.  Expected Outcomes Short Term: Continued assessment and intervention until BP is < 140/62mm HG in hypertensive participants. < 130/71mm HG in hypertensive participants with diabetes, heart failure or chronic kidney disease.;Long Term: Maintenance of blood pressure at goal levels.    Lipids Yes    Intervention Provide education and support for participant on nutrition & aerobic/resistive exercise along with prescribed medications to achieve LDL 70mg , HDL >40mg .    Expected Outcomes Short Term: Participant states understanding of desired cholesterol values and is compliant with medications prescribed. Participant is following exercise prescription and nutrition guidelines.;Long Term: Cholesterol controlled with medications as prescribed, with individualized exercise RX and with personalized nutrition plan. Value goals: LDL < 70mg , HDL > 40 mg.             Education:Diabetes - Individual verbal and written instruction to review signs/symptoms of diabetes, desired ranges of glucose level fasting, after meals and with exercise. Acknowledge that pre and post exercise glucose checks will be done for 3 sessions at entry of program. Flowsheet Row Pulmonary Rehab from 10/19/2022 in [facility] Cardiac and Pulmonary Rehab  Date 10/18/22  Educator [facility]  Instruction Review Code 1- Verbalizes Understanding       Know Your Numbers and Heart Failure: - Group verbal and visual instruction to discuss disease risk factors for cardiac and pulmonary disease and treatment options.  Reviews associated critical values for Overweight/Obesity, Hypertension, Cholesterol, and Diabetes.  Discusses basics of heart failure: signs/symptoms and treatments.  Introduces Heart Failure Zone chart for action plan for heart failure.  Written material given at  graduation.   Core Components/Risk Factors/Patient Goals Review:    Core Components/Risk Factors/Patient Goals at Discharge (Final Review):    ITP Comments:  ITP Comments     Row Name 10/09/22 1024 10/18/22 1233 10/19/22 1007 10/25/22 1233     ITP Comments Virtual Visit completed. Patient informed on EP and RD appointment and 6 Minute walk test. Patient also informed of patient health questionnaires on My Chart. Patient Verbalizes understanding. Visit diagnosis can be found in [facility] 09/18/2022. Completed and gym orientation. Initial ITP created and sent for review to Dr. Jinny Sanders, Medical Director. First full day of exercise!  Patient was oriented to gym and equipment including functions, settings, policies, and procedures.  Patient's individual exercise prescription and treatment plan were reviewed.  All starting workloads were established based on the results of the 6 minute walk test done at initial orientation visit.  The plan for exercise progression was also introduced and progression will be customized based on patient's performance and goals. 30 Day review completed. Medical Director ITP review done, changes made as directed, and signed approval by Medical Director.    new to program             Comments:

## 2022-10-26 ENCOUNTER — Other Ambulatory Visit: Payer: Self-pay

## 2022-10-26 ENCOUNTER — Encounter: Payer: Medicare Other | Admitting: *Deleted

## 2022-10-26 ENCOUNTER — Other Ambulatory Visit: Payer: Medicare Other

## 2022-10-26 DIAGNOSIS — R0609 Other forms of dyspnea: Secondary | ICD-10-CM | POA: Diagnosis not present

## 2022-10-26 LAB — GLUCOSE, CAPILLARY
Glucose-Capillary: 234 mg/dL — ABNORMAL HIGH (ref 70–99)
Glucose-Capillary: 117 mg/dL — ABNORMAL HIGH (ref 70–99)

## 2022-10-26 NOTE — Progress Notes (Signed)
Daily Session Note  Patient Details  Name: Nicholas [name] MRN: [id number] Date of Birth: 09/28/1945 Referring Provider:   Flowsheet Row Pulmonary Rehab from 10/18/2022 in [facility] Cardiac and Pulmonary Rehab  Referring Provider Dr. Birdena Jubilee       Encounter Date: 10/26/2022  Check In:  Session Check In - 10/26/22 0953       Check-In   Supervising physician immediately available to respond to emergencies See telemetry face sheet for immediately available ER MD    Location [facility]-Cardiac & Pulmonary Rehab    Staff Present Ronette Deter, BS, Exercise Physiologist;Maxon Conetta BS, , Exercise Physiologist;Coe Angelos Katrinka Blazing, RN, ADN    Virtual Visit No    Medication changes reported     No    Fall or balance concerns reported    No    Warm-up and Cool-down Performed on first and last piece of equipment    Resistance Training Performed Yes    VAD Patient? No    PAD/SET Patient? No      Pain Assessment   Currently in Pain? No/denies                Social History   Tobacco Use  Smoking Status Never  Smokeless Tobacco Never    Goals Met:  Independence with exercise equipment Exercise tolerated well No report of concerns or symptoms today Strength training completed today  Goals Unmet:  Not Applicable  Comments: Pt able to follow exercise prescription today without complaint.  Will continue to monitor for progression.    Dr. Bethann Punches is Medical Director for [facility] Cardiac Rehabilitation.  Dr. Vida Rigger is Medical Director for [facility] Pulmonary Rehabilitation.

## 2022-10-26 NOTE — Patient Instructions (Signed)
Visit Information  Thank you for taking time to visit with me today. Please don't hesitate to contact me if I can be of assistance to you before our next scheduled telephone appointment.  Following are the goals we discussed today:   Goals Addressed             This Visit's Progress    RNCM (HTN) EXPECTED OUTCOME: MONITOR, SELF-MANAGE AND REDUCE SYMPTOMS OF HTN       Current Barriers:  Knowledge Deficits related to benefits of taking blood pressures on a regular basis and normal values  Chronic Disease Management support and education needs related to effective management of HTN   BP Readings from Last 3 Encounters:  10/23/22 109/78  10/02/22 131/71  07/17/22 127/83     Planned Interventions: Evaluation of current treatment plan related to hypertension self management and patient's adherence to plan as established by provider. The patient denies any issues with HTN or heart health. States he is having shortness of breath and no energy. The patient has been to [facility] and states it did not go as he planned. He wanted to have surgery and recover but the doctor wanted him to to do some rehab and have his pacemaker checked. He states that he can tell that the rehab his helping. He has been 3 times already. He goes back to the specialist in November.    Provided education to patient re: stroke prevention, s/s of heart attack and stroke; Reviewed prescribed diet heart healthy/ADA diet. The patient is compliant with a heart healthy/ADA diet. Denies any needs related to dietary restrictions Reviewed medications with patient and discussed importance of compliance. Patient states compliance with medications. Works with pharm D for ongoing support and education.    Discussed plans with patient for ongoing care management follow up and provided patient with direct contact information for care management team; Advised patient, providing education and rationale, to monitor blood pressure daily and  record, calling PCP for findings outside established parameters. The patient states that his blood pressures are higher now. He has not had to take the medications to raise his blood pressures. Education on systolic goal of <604 and diastolic of <90. Denies any issues with hypotension at this time. Has had to take medications in the past Reviewed scheduled/upcoming provider appointments including: saw the pcp in September. Is seeing specialist and taking pulmonary/Cardiac rehab.  Advised patient to discuss changes in blood pressures or heart health with provider; Provided education on prescribed diet heart healthy/ADA diet;  Discussed complications of poorly controlled blood pressure such as heart disease, stroke, circulatory complications, vision complications, kidney impairment, sexual dysfunction;  Screening for signs and symptoms of depression related to chronic disease state;  Assessed social determinant of health barriers;   Symptom Management: Take medications as prescribed   Attend all scheduled provider appointments Perform all self care activities independently  Call provider office for new concerns or questions  call the Suicide and Crisis Lifeline: 988 call the [place] National Suicide Prevention Lifeline: 253-229-6529 or TTY: 9788200261 TTY [phone]) to talk to a trained counselor call 1-800-273-TALK (toll free, 24 hour hotline) if experiencing a Mental Health or Behavioral Health Crisis  check blood pressure weekly learn about high blood pressure keep a blood pressure log take blood pressure log to all doctor appointments call doctor for signs and symptoms of high blood pressure develop an action plan for high blood pressure keep all doctor appointments take medications for blood pressure exactly as prescribed report new  symptoms to your doctor  Follow Up Plan: Telephone follow up appointment with care management team member scheduled for: 01-10-2023 at 230 pm        RNCM Care Management Expected Outcome:  Monitor, Self-Manage and Reduce Symptoms of Afib       Current Barriers:  Knowledge Deficits related to how to tell if his heart rate is out of rhythm  with condition of AFIB Chronic Disease Management support and education needs related to effective management of AFIB  Planned Interventions: Provider order and care plan reviewed. Collaborated with PharmD regarding patient care and plan. Takes Eliquis 5mg  BID. Works with pharm D for ongoing support and education. The patient states he is having a hard time paying for his Eliquis. Is receptive to talking to the pharm D for help with financial assistance with medication cost. Works with the pharm D on a regular basis.  Counseled on increased risk of stroke due to Afib and benefits of anticoagulation for stroke prevention. Review of sx and sx and the patient states he cannot tell when he is going into AFIB. Most recent pacemaker record indicated in the last 90 days he had one episode of AFIB that last 90 minutes. Review of sx and sx and the patient is having dizziness, increased fatigue and shortness of breath. When he saw the specialist at [facility] they did a 6 minute walk test and the provider states that his heart rate stayed in the 90's and it should have been over 100. The provider ask him to have his pacemaker checked. He is working on this and doing [employer]. He is telling a positive difference.         Reviewed importance of adherence to anticoagulant exactly as prescribed. The patient is compliant with medications. Is compliant with medications. Is willing to talk to the pharm D for medications assistance for Eliquis cost constraints.  Advised patient to discuss changes in heart rate, changes in activity level, acute onset of shortness of breath, or other questions and concerns related to AFIB with provider Counseled on bleeding risk associated with AFIB and importance of self-monitoring for  signs/symptoms of bleeding Counseled on avoidance of NSAIDs due to increased bleeding risk with anticoagulants Counseled on importance of regular laboratory monitoring as prescribed. The patient has lab work as requested by providers.  Counseled on seeking medical attention after a head injury or if there is blood in the urine/stool Afib action plan reviewed. The patient goes to [facility] on 09-18-2022 and will see several providers. The patient and his wife were asking questions and the RNCM used EBP tool to educate the patient and his wife on what a 2nd degree heart block, Wenckebach is. The patient has been having sx and sx of dizziness, shortness of breath, and fatigue. He hopes Monday will offer answers to why he is feeling the way he is. He denies any acute distress today. Reflective listening and support given. Will continue to monitor.  Screening for signs and symptoms of depression related to chronic disease state Assessed social determinant of health barriers  Symptom Management: Take medications as prescribed   Attend all scheduled provider appointments Call provider office for new concerns or questions  call the Suicide and Crisis Lifeline: 988 call the [place] National Suicide Prevention Lifeline: 9894546802 or TTY: [phone] TTY (916) 192-9161) to talk to a trained counselor call 1-800-273-TALK (toll free, 24 hour hotline) if experiencing a Mental Health or Behavioral Health Crisis  - make a plan to eat healthy -  keep all lab appointments - take medicine as prescribed  Follow Up Plan: Telephone follow up appointment with care management team member scheduled for: 01-10-2023 at 230 pm       RNCM Care Management:  Expected Outcome:  Monitor, Self-Manage and Reduce Symptoms of Diabetes       Current Barriers:  Knowledge Deficits related to monitoring for changes in blood sugars and the effect of elevated blood sugars on other body systems Chronic Disease Management support and  education needs related to effective management of DM Lab Results  Component Value Date   HGBA1C 7.0 (H) 10/02/2022     Planned Interventions: Provided education to patient about basic DM disease process. The patient with slightly elevated A1C level, has stayed the same the last 6 months. The patient is mindful of changes. He feels overall he is doing well except for the shortness of breath and low energy level. He is being proactive with appointments and follow up with specialist. Education and support given. ; Reviewed medications with patient and discussed importance of medication adherence. The patient is compliant with medications. Did not tolerate Ozempic. Is currently controlling DM with diet.         Reviewed prescribed diet with patient heart healthy/ADA diet. Patient is mindful of dietary restrictions. The patient denies any issues with dietary restrictions ; Counseled on importance of regular laboratory monitoring as prescribed. Has regular lab testing;        Discussed plans with patient for ongoing care management follow up and provided patient with direct contact information for care management team;      Provided patient with written educational materials related to hypo and hyperglycemia and importance of correct treatment;       Reviewed scheduled/upcoming provider appointments including: Saw pcp in September. Has several specialist appointments coming up.       Advised patient, providing education and rationale, to check cbg when you have symptoms of low or high blood sugar and as directed   and record. The patient has not been checking his blood sugars.        Referral made to pharmacy team for assistance with medication reconciliation, education and support. Works with pharm D on a consistent basis;       Review of patient status, including review of consultants reports, relevant laboratory and other test results, and medications completed;       Advised patient to discuss  changes in DM health and well being with provider;      Screening for signs and symptoms of depression related to chronic disease state;        Assessed social determinant of health barriers;       Eye exam completed today (02-09-2022). Denies any concerns with vision and states he received a good report.   The patient saw the pcp on 10-23-2022 and has a UTI. He is taking antibiotics for the UTI. The patient states he will follow up with the pcp if he is not any better. Education provided. Will continue to monitor.   Symptom Management: Take medications as prescribed   Attend all scheduled provider appointments Call provider office for new concerns or questions  call the Suicide and Crisis Lifeline: 988 call the [place] National Suicide Prevention Lifeline: 838-128-0069 or TTY: 225-038-2406 TTY (807) 853-6772) to talk to a trained counselor call 1-800-273-TALK (toll free, 24 hour hotline) if experiencing a Mental Health or Behavioral Health Crisis  check feet daily for cuts, sores or redness trim toenails straight across  manage portion size wash and dry feet carefully every day wear comfortable, cotton socks wear comfortable, well-fitting shoes  Follow Up Plan: Telephone follow up appointment with care management team member scheduled for: 01-10-2023 at 230 pm           Our next appointment is by telephone on 01-10-2023 at 230 pm  Please call the care guide team at [phone] if you need to cancel or reschedule your appointment.   If you are experiencing a Mental Health or Behavioral Health Crisis or need someone to talk to, please call the Suicide and Crisis Lifeline: 988 call the [place] National Suicide Prevention Lifeline: 505-399-7950 or TTY: 530-019-0231 TTY 250-425-2720) to talk to a trained counselor call 1-800-273-TALK (toll free, 24 hour hotline)   Patient verbalizes understanding of instructions and care plan provided today and agrees to view in MyChart. Active MyChart  status and patient understanding of how to access instructions and care plan via MyChart confirmed with patient.     Telephone follow up appointment with care management team member scheduled for: 01-10-2023 at 230 pm  Alto Denver RN, MSN, CCM RN Care Manager  [facility]  Ambulatory Care Management  Direct Number: [phone]

## 2022-10-26 NOTE — Patient Outreach (Signed)
Care Management   Visit Note  10/26/2022 Name: Nicholas [name] MRN: [id number] DOB: 08/19/1945  Subjective: Nicholas [name] is a 77 y.o. year old male who is a primary care patient of Cannady, [name], NP. The Care Management team was consulted for assistance.      Engaged with patient spoke with patient by telephone.    Goals Addressed             This Visit's Progress    RNCM (HTN) EXPECTED OUTCOME: MONITOR, SELF-MANAGE AND REDUCE SYMPTOMS OF HTN       Current Barriers:  Knowledge Deficits related to benefits of taking blood pressures on a regular basis and normal values  Chronic Disease Management support and education needs related to effective management of HTN   BP Readings from Last 3 Encounters:  10/23/22 109/78  10/02/22 131/71  07/17/22 127/83     Planned Interventions: Evaluation of current treatment plan related to hypertension self management and patient's adherence to plan as established by provider. The patient denies any issues with HTN or heart health. States he is having shortness of breath and no energy. The patient has been to [facility] and states it did not go as he planned. He wanted to have surgery and recover but the doctor wanted him to to do some rehab and have his pacemaker checked. He states that he can tell that the rehab his helping. He has been 3 times already. He goes back to the specialist in November.    Provided education to patient re: stroke prevention, s/s of heart attack and stroke; Reviewed prescribed diet heart healthy/ADA diet. The patient is compliant with a heart healthy/ADA diet. Denies any needs related to dietary restrictions Reviewed medications with patient and discussed importance of compliance. Patient states compliance with medications. Works with pharm D for ongoing support and education.    Discussed plans with patient for ongoing care management follow up and provided patient with direct contact information for care management  team; Advised patient, providing education and rationale, to monitor blood pressure daily and record, calling PCP for findings outside established parameters. The patient states that his blood pressures are higher now. He has not had to take the medications to raise his blood pressures. Education on systolic goal of <244 and diastolic of <90. Denies any issues with hypotension at this time. Has had to take medications in the past Reviewed scheduled/upcoming provider appointments including: saw the pcp in September. Is seeing specialist and taking pulmonary/Cardiac rehab.  Advised patient to discuss changes in blood pressures or heart health with provider; Provided education on prescribed diet heart healthy/ADA diet;  Discussed complications of poorly controlled blood pressure such as heart disease, stroke, circulatory complications, vision complications, kidney impairment, sexual dysfunction;  Screening for signs and symptoms of depression related to chronic disease state;  Assessed social determinant of health barriers;   Symptom Management: Take medications as prescribed   Attend all scheduled provider appointments Perform all self care activities independently  Call provider office for new concerns or questions  call the Suicide and Crisis Lifeline: 988 call the [place] National Suicide Prevention Lifeline: 667-602-6602 or TTY: (430)164-6000 TTY 6184817518) to talk to a trained counselor call 1-800-273-TALK (toll free, 24 hour hotline) if experiencing a Mental Health or Behavioral Health Crisis  check blood pressure weekly learn about high blood pressure keep a blood pressure log take blood pressure log to all doctor appointments call doctor for signs and symptoms of high blood pressure develop an  action plan for high blood pressure keep all doctor appointments take medications for blood pressure exactly as prescribed report new symptoms to your doctor  Follow Up Plan: Telephone follow  up appointment with care management team member scheduled for: 01-10-2023 at 230 pm       RNCM Care Management Expected Outcome:  Monitor, Self-Manage and Reduce Symptoms of Afib       Current Barriers:  Knowledge Deficits related to how to tell if his heart rate is out of rhythm  with condition of AFIB Chronic Disease Management support and education needs related to effective management of AFIB  Planned Interventions: Provider order and care plan reviewed. Collaborated with PharmD regarding patient care and plan. Takes Eliquis 5mg  BID. Works with pharm D for ongoing support and education. The patient states he is having a hard time paying for his Eliquis. Is receptive to talking to the pharm D for help with financial assistance with medication cost. Works with the pharm D on a regular basis.  Counseled on increased risk of stroke due to Afib and benefits of anticoagulation for stroke prevention. Review of sx and sx and the patient states he cannot tell when he is going into AFIB. Most recent pacemaker record indicated in the last 90 days he had one episode of AFIB that last 90 minutes. Review of sx and sx and the patient is having dizziness, increased fatigue and shortness of breath. When he saw the specialist at [facility] they did a 6 minute walk test and the provider states that his heart rate stayed in the 90's and it should have been over 100. The provider ask him to have his pacemaker checked. He is working on this and doing [employer]. He is telling a positive difference.         Reviewed importance of adherence to anticoagulant exactly as prescribed. The patient is compliant with medications. Is compliant with medications. Is willing to talk to the pharm D for medications assistance for Eliquis cost constraints.  Advised patient to discuss changes in heart rate, changes in activity level, acute onset of shortness of breath, or other questions and concerns related to AFIB with  provider Counseled on bleeding risk associated with AFIB and importance of self-monitoring for signs/symptoms of bleeding Counseled on avoidance of NSAIDs due to increased bleeding risk with anticoagulants Counseled on importance of regular laboratory monitoring as prescribed. The patient has lab work as requested by providers.  Counseled on seeking medical attention after a head injury or if there is blood in the urine/stool Afib action plan reviewed. The patient goes to [facility] on 09-18-2022 and will see several providers. The patient and his wife were asking questions and the RNCM used EBP tool to educate the patient and his wife on what a 2nd degree heart block, Wenckebach is. The patient has been having sx and sx of dizziness, shortness of breath, and fatigue. He hopes Monday will offer answers to why he is feeling the way he is. He denies any acute distress today. Reflective listening and support given. Will continue to monitor.  Screening for signs and symptoms of depression related to chronic disease state Assessed social determinant of health barriers  Symptom Management: Take medications as prescribed   Attend all scheduled provider appointments Call provider office for new concerns or questions  call the Suicide and Crisis Lifeline: 988 call the [place] National Suicide Prevention Lifeline: 432-006-1535 or TTY: 830-036-2945 TTY [phone]) to talk to a trained counselor call 1-800-273-TALK (toll free,  24 hour hotline) if experiencing a Mental Health or Behavioral Health Crisis  - make a plan to eat healthy - keep all lab appointments - take medicine as prescribed  Follow Up Plan: Telephone follow up appointment with care management team member scheduled for: 01-10-2023 at 230 pm       RNCM Care Management:  Expected Outcome:  Monitor, Self-Manage and Reduce Symptoms of Diabetes       Current Barriers:  Knowledge Deficits related to monitoring for changes in blood sugars and the  effect of elevated blood sugars on other body systems Chronic Disease Management support and education needs related to effective management of DM Lab Results  Component Value Date   HGBA1C 7.0 (H) 10/02/2022     Planned Interventions: Provided education to patient about basic DM disease process. The patient with slightly elevated A1C level, has stayed the same the last 6 months. The patient is mindful of changes. He feels overall he is doing well except for the shortness of breath and low energy level. He is being proactive with appointments and follow up with specialist. Education and support given. ; Reviewed medications with patient and discussed importance of medication adherence. The patient is compliant with medications. Did not tolerate Ozempic. Is currently controlling DM with diet.         Reviewed prescribed diet with patient heart healthy/ADA diet. Patient is mindful of dietary restrictions. The patient denies any issues with dietary restrictions ; Counseled on importance of regular laboratory monitoring as prescribed. Has regular lab testing;        Discussed plans with patient for ongoing care management follow up and provided patient with direct contact information for care management team;      Provided patient with written educational materials related to hypo and hyperglycemia and importance of correct treatment;       Reviewed scheduled/upcoming provider appointments including: Saw pcp in September. Has several specialist appointments coming up.       Advised patient, providing education and rationale, to check cbg when you have symptoms of low or high blood sugar and as directed   and record. The patient has not been checking his blood sugars.        Referral made to pharmacy team for assistance with medication reconciliation, education and support. Works with pharm D on a consistent basis;       Review of patient status, including review of consultants reports, relevant  laboratory and other test results, and medications completed;       Advised patient to discuss changes in DM health and well being with provider;      Screening for signs and symptoms of depression related to chronic disease state;        Assessed social determinant of health barriers;       Eye exam completed today (02-09-2022). Denies any concerns with vision and states he received a good report.   The patient saw the pcp on 10-23-2022 and has a UTI. He is taking antibiotics for the UTI. The patient states he will follow up with the pcp if he is not any better. Education provided. Will continue to monitor.   Symptom Management: Take medications as prescribed   Attend all scheduled provider appointments Call provider office for new concerns or questions  call the Suicide and Crisis Lifeline: 988 call the [place] National Suicide Prevention Lifeline: 220-643-2700 or TTY: 229-576-6452 TTY 5311826144) to talk to a trained counselor call 1-800-273-TALK (toll free, 24 hour hotline) if  experiencing a Mental Health or Behavioral Health Crisis  check feet daily for cuts, sores or redness trim toenails straight across manage portion size wash and dry feet carefully every day wear comfortable, cotton socks wear comfortable, well-fitting shoes  Follow Up Plan: Telephone follow up appointment with care management team member scheduled for: 01-10-2023 at 230 pm             Consent to Services:  Patient was given information about care management services, agreed to services, and gave verbal consent to participate.   Plan: Telephone follow up appointment with care management team member scheduled for: 01-10-2023 at 230 pm  Alto Denver RN, MSN, CCM RN Care Manager  [facility]  Ambulatory Care Management  Direct Number: [phone]

## 2022-10-31 ENCOUNTER — Encounter: Payer: Medicare Other | Admitting: *Deleted

## 2022-10-31 DIAGNOSIS — R0609 Other forms of dyspnea: Secondary | ICD-10-CM | POA: Diagnosis not present

## 2022-10-31 NOTE — Progress Notes (Signed)
Daily Session Note  Patient Details  Name: Nicholas [name] MRN: [id number] Date of Birth: 1945/06/20 Referring Provider:   Flowsheet Row Pulmonary Rehab from 10/18/2022 in [facility] Cardiac and Pulmonary Rehab  Referring Provider Dr. Birdena Jubilee       Encounter Date: 10/31/2022  Check In:  Session Check In - 10/31/22 0950       Check-In   Supervising physician immediately available to respond to emergencies See telemetry face sheet for immediately available ER MD    Location [facility]-Cardiac & Pulmonary Rehab    Staff Present Cora Collum, RN, BSN, CCRP;Margaret Best, MS, Exercise Physiologist;Noah Tickle, BS, Exercise Physiologist;Maxon Conetta BS, , Exercise Physiologist    Virtual Visit No    Medication changes reported     No    Fall or balance concerns reported    No    Warm-up and Cool-down Performed on first and last piece of equipment    Resistance Training Performed Yes    VAD Patient? No    PAD/SET Patient? No      Pain Assessment   Currently in Pain? No/denies                Social History   Tobacco Use  Smoking Status Never  Smokeless Tobacco Never    Goals Met:  Proper associated with RPD/PD & O2 Sat Independence with exercise equipment Exercise tolerated well No report of concerns or symptoms today  Goals Unmet:  Not Applicable  Comments: Pt able to follow exercise prescription today without complaint.  Will continue to monitor for progression.    Dr. Bethann Punches is Medical Director for [facility] Cardiac Rehabilitation.  Dr. Vida Rigger is Medical Director for [facility] Pulmonary Rehabilitation.

## 2022-11-02 ENCOUNTER — Encounter: Payer: Medicare Other | Admitting: *Deleted

## 2022-11-02 DIAGNOSIS — R0609 Other forms of dyspnea: Secondary | ICD-10-CM

## 2022-11-02 NOTE — Progress Notes (Signed)
Daily Session Note  Patient Details  Name: Nicholas [name] MRN: [id number] Date of Birth: 02/22/1945 Referring Provider:   Flowsheet Row Pulmonary Rehab from 10/18/2022 in [facility] Cardiac and Pulmonary Rehab  Referring Provider Dr. Birdena Jubilee       Encounter Date: 11/02/2022  Check In:  Session Check In - 11/02/22 0926       Check-In   Supervising physician immediately available to respond to emergencies See telemetry face sheet for immediately available ER MD    Location [facility]-Cardiac & Pulmonary Rehab    Staff Present Ronette Deter, BS, Exercise Physiologist;Joseph Panola, RCP,RRT,BSRT;Maxon [place] BS, , Exercise Physiologist;Jaydeen Odor Katrinka Blazing, RN, [place]    Virtual Visit No    Medication changes reported     No    Fall or balance concerns reported    No    Warm-up and Cool-down Performed on first and last piece of equipment    Resistance Training Performed Yes    VAD Patient? No    PAD/SET Patient? No      Pain Assessment   Currently in Pain? No/denies                Social History   Tobacco Use  Smoking Status Never  Smokeless Tobacco Never    Goals Met:  Independence with exercise equipment Exercise tolerated well No report of concerns or symptoms today Strength training completed today  Goals Unmet:  Not Applicable  Comments: Pt able to follow exercise prescription today without complaint.  Will continue to monitor for progression.    Dr. Bethann Punches is Medical Director for [facility] Cardiac Rehabilitation.  Dr. Vida Rigger is Medical Director for [facility] Pulmonary Rehabilitation.

## 2022-11-07 ENCOUNTER — Encounter: Payer: Medicare Other | Admitting: *Deleted

## 2022-11-07 DIAGNOSIS — R0609 Other forms of dyspnea: Secondary | ICD-10-CM

## 2022-11-07 NOTE — Progress Notes (Signed)
Daily Session Note  Patient Details  Name: RAYHAN GROLEAU MRN: [id number] Date of Birth: 1945-05-09 Referring Provider:   Flowsheet Row Pulmonary Rehab from 10/18/2022 in [facility] Cardiac and Pulmonary Rehab  Referring Provider Dr. Birdena Jubilee       Encounter Date: 11/07/2022  Check In:  Session Check In - 11/07/22 0930       Check-In   Supervising physician immediately available to respond to emergencies See telemetry face sheet for immediately available ER MD    Location [facility]-Cardiac & Pulmonary Rehab    Staff Present Rory Percy, MS, Exercise Physiologist;Maxon Conetta BS, , Exercise Physiologist;Noah Tickle, BS, Exercise Physiologist;Bernadine Melecio, RN, BSN, CCRP    Virtual Visit No    Medication changes reported     No    Fall or balance concerns reported    No    Warm-up and Cool-down Performed on first and last piece of equipment    Resistance Training Performed Yes    VAD Patient? No    PAD/SET Patient? No      Pain Assessment   Currently in Pain? No/denies                Social History   Tobacco Use  Smoking Status Never  Smokeless Tobacco Never    Goals Met:  Proper associated with RPD/PD & O2 Sat Independence with exercise equipment Exercise tolerated well No report of concerns or symptoms today  Goals Unmet:  Not Applicable  Comments: Pt able to follow exercise prescription today without complaint.  Will continue to monitor for progression.    Dr. Bethann Punches is Medical Director for [facility] Cardiac Rehabilitation.  Dr. Vida Rigger is Medical Director for [facility] Pulmonary Rehabilitation.

## 2022-11-09 ENCOUNTER — Encounter: Payer: Medicare Other | Admitting: *Deleted

## 2022-11-09 DIAGNOSIS — R0609 Other forms of dyspnea: Secondary | ICD-10-CM | POA: Diagnosis not present

## 2022-11-09 NOTE — Progress Notes (Signed)
Daily Session Note  Patient Details  Name: Nicholas [name] MRN: [id number] Date of Birth: 1945/06/28 Referring Provider:   Flowsheet Row Pulmonary Rehab from 10/18/2022 in [facility] Cardiac and Pulmonary Rehab  Referring Provider Dr. Birdena Jubilee       Encounter Date: 11/09/2022  Check In:  Session Check In - 11/09/22 0926       Check-In   Supervising physician immediately available to respond to emergencies See telemetry face sheet for immediately available ER MD    Location [facility]-Cardiac & Pulmonary Rehab    Staff Present Cora Collum, RN, BSN, CCRP;Joseph Hood, RCP,RRT,BSRT;Maxon [place] BS, , Exercise Physiologist;Berit Raczkowski Katrinka Blazing, RN, [place]    Virtual Visit No    Medication changes reported     No    Fall or balance concerns reported    No    Warm-up and Cool-down Performed on first and last piece of equipment    Resistance Training Performed Yes    VAD Patient? No    PAD/SET Patient? No      Pain Assessment   Currently in Pain? No/denies                Social History   Tobacco Use  Smoking Status Never  Smokeless Tobacco Never    Goals Met:  Independence with exercise equipment Exercise tolerated well No report of concerns or symptoms today Strength training completed today  Goals Unmet:  Not Applicable  Comments: Pt able to follow exercise prescription today without complaint.  Will continue to monitor for progression.    Dr. Bethann Punches is Medical Director for [facility] Cardiac Rehabilitation.  Dr. Vida Rigger is Medical Director for [facility] Pulmonary Rehabilitation.

## 2022-11-13 DIAGNOSIS — I4819 Other persistent atrial fibrillation: Secondary | ICD-10-CM | POA: Insufficient documentation

## 2022-11-14 ENCOUNTER — Encounter: Payer: Self-pay | Admitting: Internal Medicine

## 2022-11-14 ENCOUNTER — Ambulatory Visit: Payer: Medicare Other | Attending: Internal Medicine | Admitting: Internal Medicine

## 2022-11-14 ENCOUNTER — Encounter: Payer: Medicare Other | Admitting: *Deleted

## 2022-11-14 VITALS — BP 111/75 | HR 79 | Ht 71.0 in | Wt 273.6 lb

## 2022-11-14 DIAGNOSIS — I951 Orthostatic hypotension: Secondary | ICD-10-CM | POA: Diagnosis not present

## 2022-11-14 DIAGNOSIS — I4819 Other persistent atrial fibrillation: Secondary | ICD-10-CM | POA: Diagnosis not present

## 2022-11-14 DIAGNOSIS — Z950 Presence of cardiac pacemaker: Secondary | ICD-10-CM | POA: Diagnosis not present

## 2022-11-14 DIAGNOSIS — I442 Atrioventricular block, complete: Secondary | ICD-10-CM | POA: Diagnosis not present

## 2022-11-14 DIAGNOSIS — R0609 Other forms of dyspnea: Secondary | ICD-10-CM

## 2022-11-14 MED ORDER — APIXABAN 5 MG PO TABS: 5.0000 mg | ORAL_TABLET | Freq: Two times a day (BID) | ORAL | 1 refills | Status: DC

## 2022-11-14 MED ORDER — MIDODRINE HCL 5 MG PO TABS: 5.0000 mg | ORAL_TABLET | Freq: Three times a day (TID) | ORAL | 3 refills | Status: DC

## 2022-11-14 NOTE — Progress Notes (Signed)
Daily Session Note  Patient Details  Name: Nicholas [name] MRN: [id number] Date of Birth: [birth year] Referring Provider:   Flowsheet Row Pulmonary Rehab from 10/18/2022 in [facility] Cardiac and Pulmonary Rehab  Referring Provider Dr. Birdena Jubilee       Encounter Date: 11/14/2022  Check In:  Session Check In - 11/14/22 0920       Check-In   Supervising physician immediately available to respond to emergencies See telemetry face sheet for immediately available ER MD    Location [facility]-Cardiac & Pulmonary Rehab    Staff Present Cora Collum, RN, BSN, CCRP;Margaret Best, MS, Exercise Physiologist;Noah Tickle, BS, Exercise Physiologist;Maxon Conetta BS, , Exercise Physiologist    Virtual Visit No    Medication changes reported     No    Fall or balance concerns reported    No    Warm-up and Cool-down Performed on first and last piece of equipment    Resistance Training Performed Yes    VAD Patient? No    PAD/SET Patient? No      Pain Assessment   Currently in Pain? No/denies                Social History   Tobacco Use  Smoking Status Never  Smokeless Tobacco Never    Goals Met:  Proper associated with RPD/PD & O2 Sat Independence with exercise equipment Exercise tolerated well No report of concerns or symptoms today  Goals Unmet:  Not Applicable  Comments: Pt able to follow exercise prescription today without complaint.  Will continue to monitor for progression.    Dr. Bethann Punches is Medical Director for [facility] Cardiac Rehabilitation.  Dr. Vida Rigger is Medical Director for [facility] Pulmonary Rehabilitation.

## 2022-11-14 NOTE — Progress Notes (Signed)
Patient Care Team: Marjie Skiff, NP as PCP - General (Nurse Practitioner) Lanier Prude, MD as PCP - Electrophysiology (Cardiology) Duke Salvia, MD as PCP - Cardiology (Cardiology) Deeann Saint, MD (Specialist) Vanna Scotland, MD as Consulting Physician (Urology) Nyoka Cowden, MD as Consulting Physician (Pulmonary Disease) Yevette Edwards, MD as Consulting Physician (Anesthesiology) Duke Salvia, MD as Consulting Physician (Cardiology) Gustavus Bryant, LCSW as Social Worker (Licensed Clinical Social Worker) Marlowe Sax, RN as Case Manager (General Practice)   HPI  Nicholas [name] is a 77 y.o. male Seen in follow-up for His bundle pacemaker implanted 1/18 for symptomatic bradycardia  and profound first degree AV block with intermittent third and second-degree heart block; also atrial fibrillation, previously on flecainide but discontinued because of coronary disease diagnosed by CTA 10/21.>> propafenone with subsequent increase in burden  Saw CL and underwent RFCA-PVI 4/22  The patient denies chest pain, nocturnal dyspnea, orthopnea or peripheral edema.  There have been no palpitations or syncope.  Complains of some dyspnea on exertion which has improved actually since he started at cardiac rehab; has had occasional dyspnea upon lying down; sleeps with a CPAP. Lightheadedness upon standing is frequent       DATE TEST EF   10/15 echo     50 %   Mild RV dysfunction   11/17 echo    60 %    Nl RV function   11/17 Myoview   46 ([facility])   10/21 Cardiac-CTA  LADd- FFR 0.75; o/w >90  10/21 Echo 60-65%   10/21 CTA-PE    3/24 CTA-PE  neg  3/24 Echo  60-65%    Hx of pulmonary embolism with repeat venous Dopplers 1/16 demonstrated small clots Managed with chronic apixaban        Date Cr K LDL Hgb  6/19 1.08   14.6  6/20 1.13   14.7  6/21 1.17<<1.46   15.6  1/22 1.34 4.2 85 13.6 (10/21)  8/22 0.9 4.6 65 15.5  3./23 0.98 4.1 67 15.1  3/24 0.91 4.6  15.5      Thromboembolic risk factors ( age -74 , HTN-1) for a CHADSVASc Score of 3      Past Medical History:  Diagnosis Date   Abdominal pain, chronic, right lower quadrant 12/13/2020   Anterior urethral stricture    Anxiety    Arthritis    a. knees, hips, hands;  b. 11/2013 s/p L TKA @ [facility].   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 Dr Graciela Husbands   Depression    DVT (deep venous thrombosis) (HCC)    Erosive esophagitis    Gross hematuria    Hyperlipemia    Hypertension    borderline   Internal hemorrhoids    Phimosis    Pulmonary embolism (HCC)     Past Surgical History:  Procedure Laterality Date   ATRIAL FIBRILLATION ABLATION N/A 05/17/2020   Procedure: ATRIAL FIBRILLATION ABLATION;  Surgeon: Lanier Prude, MD;  Location: [facility] INVASIVE CV LAB;  Service: Cardiovascular;  Laterality: N/A;   BUNIONECTOMY Bilateral 01/06/2015   Procedure: Arbutus Leas;  Surgeon: Deeann Saint, MD;  Location: [facility] ORS;  Service: Orthopedics;  Laterality: Bilateral;   CARDIAC CATHETERIZATION  ~ 2005   "once"   CATARACT EXTRACTION W/ INTRAOCULAR LENS  IMPLANT,  BILATERAL Bilateral ~ 2010   COLONOSCOPY WITH PROPOFOL N/A 12/07/2015   Procedure: COLONOSCOPY WITH PROPOFOL;  Surgeon: Christena Deem, MD;  Location: [facility] ENDOSCOPY;  Service: Endoscopy;  Laterality: N/A;   COLONOSCOPY WITH PROPOFOL N/A 11/26/2020   Procedure: COLONOSCOPY WITH PROPOFOL;  Surgeon: Toney Reil, MD;  Location: [facility] ENDOSCOPY;  Service: Gastroenterology;  Laterality: N/A;   EP IMPLANTABLE DEVICE N/A 02/16/2016   MDT dual chamber (His Bundle) pacemaker implanted by Dr Graciela Husbands for intermittent complete heart block   ESOPHAGOGASTRODUODENOSCOPY (EGD) WITH PROPOFOL N/A 12/07/2015   Procedure: ESOPHAGOGASTRODUODENOSCOPY (EGD) WITH PROPOFOL;  Surgeon: Christena Deem, MD;  Location: [facility]  ENDOSCOPY;  Service: Endoscopy;  Laterality: N/A;   ESOPHAGOGASTRODUODENOSCOPY (EGD) WITH PROPOFOL N/A 05/17/2017   Procedure: ESOPHAGOGASTRODUODENOSCOPY (EGD) WITH PROPOFOL;  Surgeon: Christena Deem, MD;  Location: [facility] ENDOSCOPY;  Service: Endoscopy;  Laterality: N/A;   HAMMER TOE SURGERY Bilateral 01/06/2015   Procedure: HAMMER TOE CORRECTION;  Surgeon: Deeann Saint, MD;  Location: [facility] ORS;  Service: Orthopedics;  Laterality: Bilateral;   KNEE CARTILAGE SURGERY Left 1965   "football injury"   Left Total Knee Arthroplasty     a. 11/2013 [facility].   PACEMAKER IMPLANT  2017   PILONIDAL CYST EXCISION  1970's   TOTAL HIP ARTHROPLASTY Right 2004   TOTAL HIP ARTHROPLASTY Left 2006   UPPER GI ENDOSCOPY      Current Outpatient Medications  Medication Sig Dispense Refill   acetaminophen (TYLENOL) 650 MG CR tablet Take 650 mg by mouth every 8 (eight) hours as needed for pain.     albuterol (VENTOLIN HFA) 108 (90 Base) MCG/ACT inhaler TAKE 2 PUFFS BY MOUTH EVERY 6 HOURS AS NEEDED 6.7 each 0   Ascorbic Acid (VITAMIN C) 1000 MG tablet Take 1,000 mg by mouth daily.     Blood Glucose Monitoring Suppl (ONETOUCH VERIO) w/Device KIT Use to check blood sugar 3 times a day and document results, bring to appointments.  Goal is <130 fasting blood sugar and <180 two hours after meals. 1 kit 0   calcium-vitamin D (OSCAL WITH D) 500-5 MG-MCG tablet Take 1 tablet by mouth daily with breakfast.     Cranberry 500 MG CAPS Take 500 mg by mouth daily.     empagliflozin (JARDIANCE) 25 MG TABS tablet Take 1 tablet (25 mg total) by mouth daily. 30 tablet 4   gabapentin (NEURONTIN) 600 MG tablet TAKE 1 TABLET BY MOUTH EVERYDAY AT BEDTIME 90 tablet 4   glucose blood test strip Use to check blood sugar 3 times a day and document results, bring to appointments.  Goal is <130 fasting blood sugar and <180 two hours after meals. 100 each 12   HYDROcodone-acetaminophen (NORCO/VICODIN) 5-325 MG tablet Take 1 tablet by mouth 2  (two) times daily. 60 tablet 0   [START ON 11/16/2022] HYDROcodone-acetaminophen (NORCO/VICODIN) 5-325 MG tablet Take 1 tablet by mouth 2 (two) times daily. 60 tablet 0   Lancets (ONETOUCH ULTRASOFT) lancets Use to check blood sugar 3 times a day and document results, bring to appointments.  Goal is <130 fasting blood sugar and <180 two hours after meals. 100 each 12   LORazepam (ATIVAN) 1 MG tablet TAKE 1 TABLET BY MOUTH EVERYDAY AT BEDTIME 90 tablet 0   Magnesium 400 MG TABS Take 400 mg by mouth daily.      MELATONIN PO Take by mouth.     midodrine (PROAMATINE) 10 MG tablet Take 0.5 tablets (5 mg total) by mouth 3 (three) times daily. 135  tablet 0   Multiple Vitamin (MULTIVITAMIN WITH MINERALS) TABS tablet Take 1 tablet by mouth daily. Centrum Silver     nystatin (MYCOSTATIN/NYSTOP) powder Apply 1 Application topically 3 (three) times daily. 60 g 2   nystatin cream (MYCOSTATIN) Apply 1 Application topically 2 (two) times daily. 30 g 4   Omega-3 Fatty Acids (FISH OIL) 1200 MG CAPS Take 1,200 mg by mouth daily.     oxymetazoline (AFRIN) 0.05 % nasal spray Place 2 sprays into both nostrils at bedtime.     pantoprazole (PROTONIX) 20 MG tablet TAKE 1 TABLET (20 MG TOTAL) BY MOUTH 2 (TWO) TIMES DAILY BEFORE A MEAL. (Patient taking differently: Take 20 mg by mouth daily.) 180 tablet 2   potassium chloride SA (KLOR-CON M) 20 MEQ tablet Take 2 tablets (40 meq) with Torsemide 30 tablet 3   QUEtiapine Fumarate (SEROQUEL XR) 150 MG 24 hr tablet TAKE 1 TABLET BY MOUTH EVERYDAY AT BEDTIME 90 tablet 4   rOPINIRole (REQUIP) 0.25 MG tablet Take 0.25 mg by mouth 4 (four) times daily.     rosuvastatin (CRESTOR) 40 MG tablet TAKE 1 TABLET BY MOUTH EVERY DAY 90 tablet 1   solifenacin (VESICARE) 10 MG tablet TAKE 1 TABLET BY MOUTH EVERY DAY 90 tablet 3   tiZANidine (ZANAFLEX) 4 MG tablet Take 1 tablet (4 mg total) by mouth every 6 (six) hours as needed for muscle spasms. 90 tablet 4   torsemide (DEMADEX) 20 MG tablet  TAKE 2 TABLETS (40 MG TOTAL) BY MOUTH DAILY AS NEEDED (FLUID RETENTION). 90 tablet 0   venlafaxine XR (EFFEXOR-XR) 75 MG 24 hr capsule TAKE 3 CAPSULES BY MOUTH EVERY DAY 270 capsule 0   apixaban (ELIQUIS) 5 MG TABS tablet Take 1 tablet (5 mg total) by mouth 2 (two) times daily. 180 tablet 1   mupirocin ointment (BACTROBAN) 2 % Apply 1 Application topically 2 (two) times daily. (Patient not taking: Reported on 11/14/2022) 22 g 0   No current facility-administered medications for this visit.    No Known Allergies    Review of Systems negative except from HPI and PMH  Physical Exam  BP 111/75 (Patient Position: Standing)   Pulse 79   Ht 5\' 11"  (1.803 m)   Wt 273 lb 9.6 oz (124.1 kg)   SpO2 97%   BMI 38.16 kg/m  Well developed and Morbidly obese in no acute distress HENT normal Neck supple   Clear Device pocket well healed; without hematoma or erythema.  There is no tethering  Regular rate and rhythm, no  gallop No  murmur Abd-soft with active BS No Clubbing cyanosis  edema Skin-warm and dry A & Oriented  Grossly normal sensory and motor function  ECG sinus rhythm with P synchronous pacing at 77 Intervals 22/11/41  Device function is abnormal.  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  Functional status improving.  Continue current medications.  No bleeding.  Continue anticoagulation.  Increasing RV pacing threshold.  At device generator  replacement we will have to  change the lead.  We will increase the frequency of surveillance as he is modestly dependent  Continue Eliquis for his atrial fibrillation.  Prescription written for [place].  Orthostatic lightheadedness continues to be a problem continue on ProAmatine.  Reviewed the importance of isometric contraction.

## 2022-11-14 NOTE — Patient Instructions (Signed)
Medication Instructions:  Your physician recommends that you continue on your current medications as directed. Please refer to the Current Medication list given to you today.  *If you need a refill on your cardiac medications before your next appointment, please call your pharmacy*   Lab Work: None ordered.  If you have labs (blood work) drawn today and your tests are completely normal, you will receive your results only by: MyChart Message (if you have MyChart) OR A paper copy in the mail If you have any lab test that is abnormal or we need to change your treatment, we will call you to review the results.   Testing/Procedures: None ordered.    Follow-Up: At [facility], you and your health needs are our priority.  As part of our continuing mission to provide you with exceptional heart care, we have created designated Provider Care Teams.  These Care Teams include your primary Cardiologist (physician) and Advanced Practice Providers (APPs -  Physician Assistants and Nurse Practitioners) who all work together to provide you with the care you need, when you need it.  We recommend signing up for the patient portal called "MyChart".  Sign up information is provided on this After Visit Summary.  MyChart is used to connect with patients for Virtual Visits (Telemedicine).  Patients are able to view lab/test results, encounter notes, upcoming appointments, etc.  Non-urgent messages can be sent to your provider as well.   To learn more about what you can do with MyChart, go to [email or web address].    Your next appointment:   3 months with Dr Odessa Fleming PA

## 2022-11-16 ENCOUNTER — Encounter: Payer: Medicare Other | Admitting: *Deleted

## 2022-11-16 DIAGNOSIS — R0609 Other forms of dyspnea: Secondary | ICD-10-CM

## 2022-11-16 NOTE — Progress Notes (Signed)
Daily Session Note  Patient Details  Name: Nicholas [name] MRN: [id number] Date of Birth: 1945/12/03 Referring Provider:   Flowsheet Row Pulmonary Rehab from 10/18/2022 in [facility] Cardiac and Pulmonary Rehab  Referring Provider Dr. Birdena Jubilee       Encounter Date: 11/16/2022  Check In:  Session Check In - 11/16/22 0926       Check-In   Supervising physician immediately available to respond to emergencies See telemetry face sheet for immediately available ER MD    Location [facility]-Cardiac & Pulmonary Rehab    Staff Present Cora Collum, RN, BSN, CCRP;Joseph Hood, RCP,RRT,BSRT;Maxon [place] BS, , Exercise Physiologist;Brelynn Wheller Katrinka Blazing, RN, [place]    Virtual Visit No    Medication changes reported     No    Fall or balance concerns reported    No    Warm-up and Cool-down Performed on first and last piece of equipment    Resistance Training Performed Yes    VAD Patient? No    PAD/SET Patient? No      Pain Assessment   Currently in Pain? No/denies                Social History   Tobacco Use  Smoking Status Never  Smokeless Tobacco Never    Goals Met:  Independence with exercise equipment Exercise tolerated well No report of concerns or symptoms today Strength training completed today  Goals Unmet:  Not Applicable  Comments: Pt able to follow exercise prescription today without complaint.  Will continue to monitor for progression.    Dr. Bethann Punches is Medical Director for [facility] Cardiac Rehabilitation.  Dr. Vida Rigger is Medical Director for [facility] Pulmonary Rehabilitation.

## 2022-11-21 ENCOUNTER — Encounter: Payer: Medicare Other | Admitting: *Deleted

## 2022-11-21 DIAGNOSIS — R0609 Other forms of dyspnea: Secondary | ICD-10-CM

## 2022-11-21 NOTE — Progress Notes (Signed)
Daily Session Note  Patient Details  Name: DESTAN CONVERSE MRN: [id number] Date of Birth: Sep 23, 1945 Referring Provider:   Flowsheet Row Pulmonary Rehab from 10/18/2022 in [facility] Cardiac and Pulmonary Rehab  Referring Provider Dr. Birdena Jubilee       Encounter Date: 11/21/2022  Check In:  Session Check In - 11/21/22 0905       Check-In   Supervising physician immediately available to respond to emergencies See telemetry face sheet for immediately available ER MD    Location [facility]-Cardiac & Pulmonary Rehab    Staff Present Cora Collum, RN, BSN, CCRP;Margaret Best, MS, Exercise Physiologist;Maxon Conetta BS, , Exercise Physiologist;Noah Tickle, BS, Exercise Physiologist    Virtual Visit No    Medication changes reported     No    Fall or balance concerns reported    No    Warm-up and Cool-down Performed on first and last piece of equipment    Resistance Training Performed Yes    VAD Patient? No    PAD/SET Patient? No      Pain Assessment   Currently in Pain? No/denies                Social History   Tobacco Use  Smoking Status Never  Smokeless Tobacco Never    Goals Met:  Proper associated with RPD/PD & O2 Sat Independence with exercise equipment Exercise tolerated well No report of concerns or symptoms today  Goals Unmet:  Not Applicable  Comments: Pt able to follow exercise prescription today without complaint.  Will continue to monitor for progression.  Tommy fell out of bed last night,he was having a bad dream.  He did scrape his left knee and is sore.     Dr. Bethann Punches is Medical Director for [facility] Cardiac Rehabilitation.  Dr. Vida Rigger is Medical Director for [facility] Pulmonary Rehabilitation.

## 2022-11-22 ENCOUNTER — Ambulatory Visit (INDEPENDENT_AMBULATORY_CARE_PROVIDER_SITE_OTHER): Payer: Medicare Other | Admitting: Family Medicine

## 2022-11-22 ENCOUNTER — Encounter: Payer: Self-pay | Admitting: Family Medicine

## 2022-11-22 ENCOUNTER — Encounter: Payer: Self-pay | Admitting: *Deleted

## 2022-11-22 ENCOUNTER — Ambulatory Visit: Payer: Self-pay | Admitting: *Deleted

## 2022-11-22 VITALS — BP 145/87 | HR 78 | Ht 71.0 in | Wt 278.8 lb

## 2022-11-22 DIAGNOSIS — R300 Dysuria: Secondary | ICD-10-CM

## 2022-11-22 DIAGNOSIS — R0609 Other forms of dyspnea: Secondary | ICD-10-CM

## 2022-11-22 DIAGNOSIS — N3000 Acute cystitis without hematuria: Secondary | ICD-10-CM | POA: Diagnosis not present

## 2022-11-22 MED ORDER — SULFAMETHOXAZOLE-TRIMETHOPRIM 800-160 MG PO TABS: 1.0000 | ORAL_TABLET | Freq: Two times a day (BID) | ORAL | 0 refills | Status: DC

## 2022-11-22 MED ORDER — NYSTATIN 100000 UNIT/GM EX CREA: 1.0000 | TOPICAL_CREAM | Freq: Two times a day (BID) | CUTANEOUS | 4 refills | Status: AC

## 2022-11-22 NOTE — Progress Notes (Signed)
Pulmonary Individual Treatment Plan  Patient Details  Name: Nicholas [name] MRN: [id number] Date of Birth: 28-Apr-1945 Referring Provider:   Flowsheet Row Pulmonary Rehab from 10/18/2022 in [facility] Cardiac and Pulmonary Rehab  Referring Provider Dr. Birdena Jubilee       Initial Encounter Date:  Flowsheet Row Pulmonary Rehab from 10/18/2022 in [facility] Cardiac and Pulmonary Rehab  Date 10/18/22       Visit Diagnosis: DOE (dyspnea on exertion)  Patient's Home Medications on Admission:  Current Outpatient Medications:    acetaminophen (TYLENOL) 650 MG CR tablet, Take 650 mg by mouth every 8 (eight) hours as needed for pain., Disp: , Rfl:    albuterol (VENTOLIN HFA) 108 (90 Base) MCG/ACT inhaler, TAKE 2 PUFFS BY MOUTH EVERY 6 HOURS AS NEEDED, Disp: 6.7 each, Rfl: 0   apixaban (ELIQUIS) 5 MG TABS tablet, Take 1 tablet (5 mg total) by mouth 2 (two) times daily., Disp: 180 tablet, Rfl: 1   Ascorbic Acid (VITAMIN C) 1000 MG tablet, Take 1,000 mg by mouth daily., Disp: , Rfl:    Blood Glucose Monitoring Suppl (ONETOUCH VERIO) w/Device KIT, Use to check blood sugar 3 times a day and document results, bring to appointments.  Goal is <130 fasting blood sugar and <180 two hours after meals., Disp: 1 kit, Rfl: 0   calcium-vitamin D (OSCAL WITH D) 500-5 MG-MCG tablet, Take 1 tablet by mouth daily with breakfast., Disp: , Rfl:    Cranberry 500 MG CAPS, Take 500 mg by mouth daily., Disp: , Rfl:    empagliflozin (JARDIANCE) 25 MG TABS tablet, Take 1 tablet (25 mg total) by mouth daily., Disp: 30 tablet, Rfl: 4   gabapentin (NEURONTIN) 600 MG tablet, TAKE 1 TABLET BY MOUTH EVERYDAY AT BEDTIME, Disp: 90 tablet, Rfl: 4   glucose blood test strip, Use to check blood sugar 3 times a day and document results, bring to appointments.  Goal is <130 fasting blood sugar and <180 two hours after meals., Disp: 100 each, Rfl: 12   HYDROcodone-acetaminophen (NORCO/VICODIN) 5-325 MG tablet, Take 1 tablet by mouth 2 (two)  times daily., Disp: 60 tablet, Rfl: 0   Lancets (ONETOUCH ULTRASOFT) lancets, Use to check blood sugar 3 times a day and document results, bring to appointments.  Goal is <130 fasting blood sugar and <180 two hours after meals., Disp: 100 each, Rfl: 12   LORazepam (ATIVAN) 1 MG tablet, TAKE 1 TABLET BY MOUTH EVERYDAY AT BEDTIME, Disp: 90 tablet, Rfl: 0   Magnesium 400 MG TABS, Take 400 mg by mouth daily. , Disp: , Rfl:    MELATONIN PO, Take by mouth., Disp: , Rfl:    midodrine (PROAMATINE) 5 MG tablet, Take 1 tablet (5 mg total) by mouth 3 (three) times daily with meals., Disp: 270 tablet, Rfl: 3   Multiple Vitamin (MULTIVITAMIN WITH MINERALS) TABS tablet, Take 1 tablet by mouth daily. Centrum Silver, Disp: , Rfl:    mupirocin ointment (BACTROBAN) 2 %, Apply 1 Application topically 2 (two) times daily. (Patient not taking: Reported on 11/14/2022), Disp: 22 g, Rfl: 0   nystatin (MYCOSTATIN/NYSTOP) powder, Apply 1 Application topically 3 (three) times daily., Disp: 60 g, Rfl: 2   nystatin cream (MYCOSTATIN), Apply 1 Application topically 2 (two) times daily., Disp: 30 g, Rfl: 4   Omega-3 Fatty Acids (FISH OIL) 1200 MG CAPS, Take 1,200 mg by mouth daily., Disp: , Rfl:    oxymetazoline (AFRIN) 0.05 % nasal spray, Place 2 sprays into both nostrils at bedtime., Disp: ,  Rfl:    pantoprazole (PROTONIX) 20 MG tablet, TAKE 1 TABLET (20 MG TOTAL) BY MOUTH 2 (TWO) TIMES DAILY BEFORE A MEAL. (Patient taking differently: Take 20 mg by mouth daily.), Disp: 180 tablet, Rfl: 2   potassium chloride SA (KLOR-CON M) 20 MEQ tablet, Take 2 tablets (40 meq) with Torsemide, Disp: 30 tablet, Rfl: 3   QUEtiapine Fumarate (SEROQUEL XR) 150 MG 24 hr tablet, TAKE 1 TABLET BY MOUTH EVERYDAY AT BEDTIME, Disp: 90 tablet, Rfl: 4   rOPINIRole (REQUIP) 0.25 MG tablet, Take 0.25 mg by mouth 4 (four) times daily., Disp: , Rfl:    rosuvastatin (CRESTOR) 40 MG tablet, TAKE 1 TABLET BY MOUTH EVERY DAY, Disp: 90 tablet, Rfl: 1    solifenacin (VESICARE) 10 MG tablet, TAKE 1 TABLET BY MOUTH EVERY DAY, Disp: 90 tablet, Rfl: 3   tiZANidine (ZANAFLEX) 4 MG tablet, Take 1 tablet (4 mg total) by mouth every 6 (six) hours as needed for muscle spasms., Disp: 90 tablet, Rfl: 4   torsemide (DEMADEX) 20 MG tablet, TAKE 2 TABLETS (40 MG TOTAL) BY MOUTH DAILY AS NEEDED (FLUID RETENTION)., Disp: 90 tablet, Rfl: 0   venlafaxine XR (EFFEXOR-XR) 75 MG 24 hr capsule, TAKE 3 CAPSULES BY MOUTH EVERY DAY, Disp: 270 capsule, Rfl: 0  Past Medical History: Past Medical History:  Diagnosis Date   Abdominal pain, chronic, right lower quadrant 12/13/2020   Anterior urethral stricture    Anxiety    Arthritis    a. knees, hips, hands;  b. 11/2013 s/p L TKA @ [facility].   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 Dr Graciela Husbands   Depression    DVT (deep venous thrombosis) (HCC)    Erosive esophagitis    Gross hematuria    Hyperlipemia    Hypertension    borderline   Internal hemorrhoids    Phimosis    Pulmonary embolism (HCC)     Tobacco Use: Social History   Tobacco Use  Smoking Status Never  Smokeless Tobacco Never    Labs: Review Flowsheet  More data exists      Latest Ref Rng & Units 08/24/2021 11/29/2021 03/01/2022 05/30/2022 10/02/2022  Labs for ITP Cardiac and Pulmonary Rehab  Cholestrol 100 - 199 mg/dL 235  361  443  154  008   LDL (calc) 0 - 99 mg/dL 59  63  58  59  676   HDL-C >39 mg/dL 55  49  58  49  42   Trlycerides 0 - 149 mg/dL 77  195  97  093  267   Hemoglobin A1c 4.8 - 5.6 % 5.9  6.8  6.8  7.0  7.0     Details             Pulmonary Assessment Scores:  Pulmonary Assessment Scores     Row Name 10/18/22 1252         ADL UCSD   ADL Phase Entry     SOB Score total 89     Rest 0     Walk 5     Stairs 5     Bath 5     Dress 4     Shop 5       CAT Score   CAT Score 20       mMRC  Score   mMRC  Score 3              UCSD: Self-administered rating of dyspnea associated with activities of daily living (ADLs) 6-point scale (0 = "not at all" to 5 = "maximal or unable to do because of breathlessness")  Scoring Scores range from 0 to 120.  Minimally important difference is 5 units  CAT: CAT can identify the health impairment of COPD patients and is better correlated with disease progression.  CAT has a scoring range of zero to 40. The CAT score is classified into four groups of low (less than 10), medium (10 - 20), high (21-30) and very high (31-40) based on the impact level of disease on health status. A CAT score over 10 suggests significant symptoms.  A worsening CAT score could be explained by an exacerbation, poor medication adherence, poor inhaler technique, or progression of COPD or comorbid conditions.  CAT MCID is 2 points  mMRC: mMRC (Modified Medical Research Council) Dyspnea Scale is used to assess the degree of baseline functional disability in patients of respiratory disease due to dyspnea. No minimal important difference is established. A decrease in score of 1 point or greater is considered a positive change.   Pulmonary Function Assessment:  Pulmonary Function Assessment - 10/09/22 1023       Breath   Shortness of Breath Yes;Limiting activity             Exercise Target Goals: Exercise Program Goal: Individual exercise prescription set using results from initial 6 min walk test and THRR while considering  patient's activity barriers and safety.   Exercise Prescription Goal: Initial exercise prescription builds to 30-45 minutes a day of aerobic activity, 2-3 days per week.  Home exercise guidelines will be given to patient during program as part of exercise prescription that the participant will acknowledge.  Education: Aerobic Exercise: - Group verbal and visual presentation on the components of exercise prescription. Introduces F.I.T.T  principle from ACSM for exercise prescriptions.  Reviews F.I.T.T. principles of aerobic exercise including progression. Written material given at graduation.   Education: Resistance Exercise: - Group verbal and visual presentation on the components of exercise prescription. Introduces F.I.T.T principle from ACSM for exercise prescriptions  Reviews F.I.T.T. principles of resistance exercise including progression. Written material given at graduation.    Education: Exercise & Equipment Safety: - Individual verbal instruction and demonstration of equipment use and safety with use of the equipment. Flowsheet Row Pulmonary Rehab from 11/16/2022 in [facility] Cardiac and Pulmonary Rehab  Date 10/18/22  Educator [facility]  Instruction Review Code 1- Verbalizes Understanding       Education: Exercise Physiology & General Exercise Guidelines: - Group verbal and written instruction with models to review the exercise physiology of the cardiovascular system and associated critical values. Provides general exercise guidelines with specific guidelines to those with heart or lung disease.  Flowsheet Row Pulmonary Rehab from 11/16/2022 in [facility] Cardiac and Pulmonary Rehab  Date 11/09/22  Educator [facility]  Instruction Review Code 1- [employer] Understanding       Education: Flexibility, Balance, Mind/Body Relaxation: - Group verbal and visual presentation with interactive activity on the components of exercise prescription. Introduces F.I.T.T principle from ACSM for exercise prescriptions. Reviews F.I.T.T. principles of flexibility and balance exercise training including progression. Also discusses the mind body connection.  Reviews various relaxation techniques to help reduce and manage stress (i.e. Deep breathing, progressive muscle relaxation, and visualization). Balance handout provided to take home. Written material given at graduation.   Activity Barriers &  Risk Stratification:  Activity Barriers & Cardiac Risk  Stratification - 10/18/22 1236       Activity Barriers & Cardiac Risk Stratification   Activity Barriers Right Hip Replacement;Left Knee Replacement;Left Hip Replacement;Right Knee Replacement             6 Minute Walk:  6 Minute Walk     Row Name 10/18/22 1233         6 Minute Walk   Phase Initial     Distance 825 feet     Walk Time 6 minutes     # of Rest Breaks 0     MPH 1.56     METS 1.07     RPE 13     Perceived Dyspnea  3     VO2 Peak 3.76     Symptoms Yes (comment)     Comments SOB     Resting HR 99 bpm     Resting BP 110/60     Resting Oxygen Saturation  95 %     Exercise Oxygen Saturation  during 6 min walk 93 %     Max Ex. HR 107 bpm     Max Ex. BP 128/70     2 Minute Post BP 118/76       Interval HR   1 Minute HR 81     2 Minute HR 70     3 Minute HR 84     4 Minute HR 82     5 Minute HR 77     6 Minute HR 107     2 Minute Post HR 102     Interval Heart Rate? Yes       Interval Oxygen   Interval Oxygen? Yes     Baseline Oxygen Saturation % 95 %     1 Minute Oxygen Saturation % 93 %     1 Minute Liters of Oxygen 0 L     2 Minute Oxygen Saturation % 94 %     2 Minute Liters of Oxygen 0 L     3 Minute Oxygen Saturation % 93 %     3 Minute Liters of Oxygen 0 L     4 Minute Oxygen Saturation % 93 %     4 Minute Liters of Oxygen 0 L     5 Minute Oxygen Saturation % 94 %     5 Minute Liters of Oxygen 0 L     6 Minute Oxygen Saturation % 94 %     6 Minute Liters of Oxygen 0 L     2 Minute Post Oxygen Saturation % 94 %     2 Minute Post Liters of Oxygen 0 L             Oxygen Initial Assessment:  Oxygen Initial Assessment - 10/18/22 1252       Home Oxygen   Home Oxygen Device None    Sleep Oxygen Prescription CPAP    Liters per minute 0    Home Exercise Oxygen Prescription None    Home Resting Oxygen Prescription None    Compliance with Home Oxygen Use Yes      Initial 6 min Walk   Oxygen Used None      Program Oxygen  Prescription   Program Oxygen Prescription None      Intervention   Short Term Goals To learn and exhibit compliance with exercise, home and travel O2 prescription;To learn and understand importance of monitoring SPO2 with  pulse oximeter and demonstrate accurate use of the pulse oximeter.;To learn and understand importance of maintaining oxygen saturations>88%;To learn and demonstrate proper pursed lip breathing techniques or other breathing techniques. ;To learn and demonstrate proper use of respiratory medications    Long  Term Goals Exhibits compliance with exercise, home  and travel O2 prescription;Verbalizes importance of monitoring SPO2 with pulse oximeter and return demonstration;Maintenance of O2 saturations>88%;Exhibits proper breathing techniques, such as pursed lip breathing or other method taught during program session;Compliance with respiratory medication;Demonstrates proper use of MDI's             Oxygen Re-Evaluation:  Oxygen Re-Evaluation     Row Name 11/09/22 0928             Program Oxygen Prescription   Program Oxygen Prescription None         Home Oxygen   Home Oxygen Device None       Sleep Oxygen Prescription CPAP       Liters per minute 0       Home Exercise Oxygen Prescription None       Home Resting Oxygen Prescription None       Compliance with Home Oxygen Use Yes         Goals/Expected Outcomes   Short Term Goals To learn and demonstrate proper pursed lip breathing techniques or other breathing techniques.        Long  Term Goals Exhibits proper breathing techniques, such as pursed lip breathing or other method taught during program session       Comments Informed patient how to perform the Pursed Lipped breathing technique. Told patient to Inhale through the nose and out the mouth with pursed lips to keep their airways open, help oxygenate them better, practice when at rest or doing strenuous activity. Patient Verbalizes understanding of technique and  will work on and be reiterated during [facility].       Goals/Expected Outcomes Short: use PLB with exertion. Long: use PLB on exertion proficiently and independently.                Oxygen Discharge (Final Oxygen Re-Evaluation):  Oxygen Re-Evaluation - 11/09/22 0928       Program Oxygen Prescription   Program Oxygen Prescription None      Home Oxygen   Home Oxygen Device None    Sleep Oxygen Prescription CPAP    Liters per minute 0    Home Exercise Oxygen Prescription None    Home Resting Oxygen Prescription None    Compliance with Home Oxygen Use Yes      Goals/Expected Outcomes   Short Term Goals To learn and demonstrate proper pursed lip breathing techniques or other breathing techniques.     Long  Term Goals Exhibits proper breathing techniques, such as pursed lip breathing or other method taught during program session    Comments Informed patient how to perform the Pursed Lipped breathing technique. Told patient to Inhale through the nose and out the mouth with pursed lips to keep their airways open, help oxygenate them better, practice when at rest or doing strenuous activity. Patient Verbalizes understanding of technique and will work on and be reiterated during [facility].    Goals/Expected Outcomes Short: use PLB with exertion. Long: use PLB on exertion proficiently and independently.             Initial Exercise Prescription:  Initial Exercise Prescription - 10/18/22 1200       Date of Initial Exercise RX and Referring  Provider   Date 10/18/22    Referring Provider Dr. Birdena Jubilee      Oxygen   Maintain Oxygen Saturation 88% or higher      NuStep   Level 1   T6   SPM 80    Minutes 15    METs 1.07      REL-XR   Level 1    Speed 50    Minutes 15    METs 1.07      Track   Laps 10    Minutes 15    METs 1.54      Prescription Details   Frequency (times per week) 2    Duration Progress to 30 minutes of continuous aerobic without  signs/symptoms of physical distress      Intensity   THRR 40-80% of Max Heartrate 116-134    Ratings of Perceived Exertion 11-13    Perceived Dyspnea 0-4      Progression   Progression Continue to progress workloads to maintain intensity without signs/symptoms of physical distress.      Resistance Training   Training Prescription Yes    Weight 3    Reps 10-15             Perform Capillary Blood Glucose checks as needed.  Exercise Prescription Changes:   Exercise Prescription Changes     Row Name 10/18/22 1200 10/19/22 1000 10/26/22 1600 11/09/22 1600       Response to Exercise   Blood Pressure (Admit) 110/60 110/60 114/64 120/68    Blood Pressure (Exercise) 128/70 128/70 128/70 136/68    Blood Pressure (Exit) 118/76 118/76 130/78 108/58    Heart Rate (Admit) 99 bpm 99 bpm 98 bpm 87 bpm    Heart Rate (Exercise) 107 bpm 107 bpm 98 bpm 108 bpm    Heart Rate (Exit) 92 bpm 92 bpm 99 bpm 94 bpm    Oxygen Saturation (Admit) 95 % 95 % 93 % 94 %    Oxygen Saturation (Exercise) 93 % 93 % 89 % 91 %    Oxygen Saturation (Exit) 94 % 94 % 93 % 94 %    Rating of Perceived Exertion (Exercise) 13 13 15 15     Perceived Dyspnea (Exercise) 3 3 2 2     Symptoms SOB SOB -- SOB    Comments 6 MWT results 6 MWT results -- --    Duration -- -- Continue with 30 min of aerobic exercise without signs/symptoms of physical distress. Continue with 30 min of aerobic exercise without signs/symptoms of physical distress.    Intensity -- -- THRR unchanged THRR unchanged      Progression   Progression -- -- Continue to progress workloads to maintain intensity without signs/symptoms of physical distress. Continue to progress workloads to maintain intensity without signs/symptoms of physical distress.    Average METs -- -- -- 2.21      Resistance Training   Training Prescription -- Yes Yes Yes    Weight -- 3 3 3  lb    Reps -- 10-15 10-15 10-15      Interval Training   Interval Training -- -- -- No       NuStep   Level -- 1  T6 1  T6 4  T6 nustep    SPM -- 80 80 --    Minutes -- 15 15 15     METs -- 1.07 2.4 2.6      REL-XR   Level -- 1 1 2     Speed --  50 50 --    Minutes -- 15 15 15     METs -- 1.07 1.07 3.4      Track   Laps -- 10 -- 15  Hallway    Minutes -- 15 -- 15    METs -- 1.54 -- 1.65      Oxygen   Maintain Oxygen Saturation -- -- -- 88% or higher             Exercise Comments:   Exercise Comments     Row Name 10/19/22 1007           Exercise Comments First full day of exercise!  Patient was oriented to gym and equipment including functions, settings, policies, and procedures.  Patient's individual exercise prescription and treatment plan were reviewed.  All starting workloads were established based on the results of the 6 minute walk test done at initial orientation visit.  The plan for exercise progression was also introduced and progression will be customized based on patient's performance and goals.                Exercise Goals and Review:   Exercise Goals     Row Name 10/18/22 1247             Exercise Goals   Increase Physical Activity Yes       Intervention Provide advice, education, support and counseling about physical activity/exercise needs.;Develop an individualized exercise prescription for aerobic and resistive training based on initial evaluation findings, risk stratification, comorbidities and participant's personal goals.       Expected Outcomes Short Term: Attend rehab on a regular basis to increase amount of physical activity.;Long Term: Add in home exercise to make exercise part of routine and to increase amount of physical activity.;Long Term: Exercising regularly at least 3-5 days a week.       Increase Strength and Stamina Yes       Intervention Provide advice, education, support and counseling about physical activity/exercise needs.;Develop an individualized exercise prescription for aerobic and resistive training based  on initial evaluation findings, risk stratification, comorbidities and participant's personal goals.       Expected Outcomes Short Term: Increase workloads from initial exercise prescription for resistance, speed, and METs.;Short Term: Perform resistance training exercises routinely during rehab and add in resistance training at home;Long Term: Improve cardiorespiratory fitness, muscular endurance and strength as measured by increased METs and functional capacity ( )       Able to understand and use rate of perceived exertion (RPE) scale Yes       Intervention Provide education and explanation on how to use RPE scale       Expected Outcomes Short Term: Able to use RPE daily in rehab to express subjective intensity level;Long Term:  Able to use RPE to guide intensity level when exercising independently       Able to understand and use Dyspnea scale Yes       Intervention Provide education and explanation on how to use Dyspnea scale       Expected Outcomes Short Term: Able to use Dyspnea scale daily in rehab to express subjective sense of shortness of breath during exertion;Long Term: Able to use Dyspnea scale to guide intensity level when exercising independently       Knowledge and understanding of Target Heart Rate Range (THRR) Yes       Intervention Provide education and explanation of THRR including how the numbers were predicted and where they are located for reference  Expected Outcomes Short Term: Able to state/look up THRR;Long Term: Able to use THRR to govern intensity when exercising independently;Short Term: Able to use daily as guideline for intensity in rehab       Able to check pulse independently Yes       Intervention Provide education and demonstration on how to check pulse in carotid and radial arteries.;Review the importance of being able to check your own pulse for safety during independent exercise       Expected Outcomes Short Term: Able to explain why pulse checking is  important during independent exercise;Long Term: Able to check pulse independently and accurately       Understanding of Exercise Prescription Yes       Intervention Provide education, explanation, and written materials on patient's individual exercise prescription       Expected Outcomes Short Term: Able to explain program exercise prescription;Long Term: Able to explain home exercise prescription to exercise independently                Exercise Goals Re-Evaluation :  Exercise Goals Re-Evaluation     Row Name 10/19/22 1007 10/26/22 1700 11/09/22 1603         Exercise Goal Re-Evaluation   Exercise Goals Review Able to understand and use rate of perceived exertion (RPE) scale;Able to understand and use Dyspnea scale;Knowledge and understanding of Target Heart Rate Range (THRR);Understanding of Exercise Prescription Increase Physical Activity;Understanding of Exercise Prescription;Increase Strength and Stamina Increase Physical Activity;Increase Strength and Stamina;Understanding of Exercise Prescription     Comments Reviewed RPE and dyspnea scale, THR and program prescription with pt today.  Pt voiced understanding and was given a copy of goals to take home. Nicholas [name] is off to a good start in the program. He has completed one exercise session on this review. On his first day, he was able to follow his exercise prescription with the T6, and only was able to do 5 minutes on the XR. We will continue to monitor his progress in the program. Nicholas [name] is doing well in rehab. He has increased his laps walking to 15 laps in the hallway. He also improved to level 4 on the T6 nustep and level 2 on the XR. He continues to do well with 3 lb handweights for resistance training as well. We will continue to monitor his progress in the program.     Expected Outcomes Short: Use RPE daily to regulate intensity. Long: Follow program prescription in THR. Short: Continue to follow current exercise prescription. Long:  Continue exercise to improve strength and stamina. Short: Continue to push for more laps on the track. Long: Continue exercise to improve strength and stamina.              Discharge Exercise Prescription (Final Exercise Prescription Changes):  Exercise Prescription Changes - 11/09/22 1600       Response to Exercise   Blood Pressure (Admit) 120/68    Blood Pressure (Exercise) 136/68    Blood Pressure (Exit) 108/58    Heart Rate (Admit) 87 bpm    Heart Rate (Exercise) 108 bpm    Heart Rate (Exit) 94 bpm    Oxygen Saturation (Admit) 94 %    Oxygen Saturation (Exercise) 91 %    Oxygen Saturation (Exit) 94 %    Rating of Perceived Exertion (Exercise) 15    Perceived Dyspnea (Exercise) 2    Symptoms SOB    Duration Continue with 30 min of aerobic exercise without signs/symptoms of physical distress.  Intensity THRR unchanged      Progression   Progression Continue to progress workloads to maintain intensity without signs/symptoms of physical distress.    Average METs 2.21      Resistance Training   Training Prescription Yes    Weight 3 lb    Reps 10-15      Interval Training   Interval Training No      NuStep   Level 4   T6 nustep   Minutes 15    METs 2.6      REL-XR   Level 2    Minutes 15    METs 3.4      Track   Laps 15   Hallway   Minutes 15    METs 1.65      Oxygen   Maintain Oxygen Saturation 88% or higher             Nutrition:  Target Goals: Understanding of nutrition guidelines, daily intake of sodium 1500mg , cholesterol 200mg , calories 30% from fat and 7% or less from saturated fats, daily to have 5 or more servings of fruits and vegetables.  Education: All About Nutrition: -Group instruction provided by verbal, written material, interactive activities, discussions, models, and posters to present general guidelines for heart healthy nutrition including fat, fiber, MyPlate, the role of sodium in heart healthy nutrition, utilization of the  nutrition label, and utilization of this knowledge for meal planning. Follow up email sent as well. Written material given at graduation.   Biometrics:  Pre Biometrics - 10/18/22 1248       Pre Biometrics   Height 5' 11.5" (1.816 m)    Weight 280 lb 6.4 oz (127.2 kg)    Waist Circumference 51 inches    Hip Circumference 53.5 inches    Waist to Hip Ratio 0.95 %    BMI (Calculated) 38.57    Single Leg Stand 1.6 seconds              Nutrition Therapy Plan and Nutrition Goals:  Nutrition Therapy & Goals - 10/18/22 1319       Nutrition Therapy   Diet Cardiac, Low Na    Protein (specify units) 75    Fiber 30 grams    Whole Grain Foods 3 servings    Saturated Fats 15 max. grams    Fruits and Vegetables 5 servings/day    Sodium 2 grams      Personal Nutrition Goals   Nutrition Goal Drink 2 bottles of water daily    Personal Goal #2 Eat something nutrient/calorie dense at every meal hour    Personal Goal #3 Pair a complex carb with a protein or healthy fat    Comments Patient drinking ~20oz of water daily. Spoke about the importance of hydration and set short term goal of drinking 2 bottles per day (~32oz), with a long term goal of ~64oz daily. He reports that he likes to snack on sweets, but his appetite has been less than it used to be, often not missing a meal or getting full quickly. Reviewed mediterranean diet handout, educated on types of fats, sources, how to read food label. Educated on how healthy fats can be heart healthy and high in calories. Explain how choosing calorie dense foods such as peanut butter, olives, olives or avocado oils. Reviewed his 24hr food recall, explained how he should look to pair complex carbs with a protein or healthy fats when making meals or snacks. Brainstormed and built out a few example meals  and snacks focusing on getting enough nutrition with his poor appetite.      Intervention Plan   Intervention Prescribe, educate and counsel regarding  individualized specific dietary modifications aiming towards targeted core components such as weight, hypertension, lipid management, diabetes, heart failure and other comorbidities.;Nutrition handout(s) given to patient.    Expected Outcomes Short Term Goal: Understand basic principles of dietary content, such as calories, fat, sodium, cholesterol and nutrients.;Short Term Goal: A plan has been developed with personal nutrition goals set during dietitian appointment.;Long Term Goal: Adherence to prescribed nutrition plan.             Nutrition Assessments:  MEDIFICTS Score Key: >=70 Need to make dietary changes  40-70 Heart Healthy Diet <= 40 Therapeutic Level Cholesterol Diet  Flowsheet Row Pulmonary Rehab from 10/18/2022 in [facility] Cardiac and Pulmonary Rehab  Picture Your Plate Total Score on Admission 58      Picture Your Plate Scores: <16 Unhealthy dietary pattern with much room for improvement. 41-50 Dietary pattern unlikely to meet recommendations for good health and room for improvement. 51-60 More healthful dietary pattern, with some room for improvement.  >60 Healthy dietary pattern, although there may be some specific behaviors that could be improved.   Nutrition Goals Re-Evaluation:  Nutrition Goals Re-Evaluation     Row Name 11/09/22 0929             Goals   Current Weight 276 lb (125.2 kg)       Comment Patient was informed on why it is important to maintain a balanced diet when dealing with Respiratory issues. Explained that it takes a lot of energy to breath and when they are short of breath often they will need to have a good diet to help keep up with the calories they are expending for breathing.       Expected Outcome Short: Choose and plan snacks accordingly to patients caloric intake to improve breathing. Long: Maintain a diet independently that meets their caloric intake to aid in daily shortness of breath.                Nutrition Goals Discharge  (Final Nutrition Goals Re-Evaluation):  Nutrition Goals Re-Evaluation - 11/09/22 0929       Goals   Current Weight 276 lb (125.2 kg)    Comment Patient was informed on why it is important to maintain a balanced diet when dealing with Respiratory issues. Explained that it takes a lot of energy to breath and when they are short of breath often they will need to have a good diet to help keep up with the calories they are expending for breathing.    Expected Outcome Short: Choose and plan snacks accordingly to patients caloric intake to improve breathing. Long: Maintain a diet independently that meets their caloric intake to aid in daily shortness of breath.             Psychosocial: Target Goals: Acknowledge presence or absence of significant depression and/or stress, maximize coping skills, provide positive support system. Participant is able to verbalize types and ability to use techniques and skills needed for reducing stress and depression.   Education: Stress, Anxiety, and Depression - Group verbal and visual presentation to define topics covered.  Reviews how body is impacted by stress, anxiety, and depression.  Also discusses healthy ways to reduce stress and to treat/manage anxiety and depression.  Written material given at graduation. Flowsheet Row Pulmonary Rehab from 11/16/2022 in [facility] Cardiac and Pulmonary Rehab  Date 11/02/22  Educator SB  Instruction Review Code 1- [employer] Understanding       Education: Sleep Hygiene -Provides group verbal and written instruction about how sleep can affect your health.  Define sleep hygiene, discuss sleep cycles and impact of sleep habits. Review good sleep hygiene tips.    Initial Review & Psychosocial Screening:  Initial Psych Review & Screening - 10/09/22 1025       Initial Review   Current issues with Current Psychotropic Meds;Current Sleep Concerns      Family Dynamics   Good Support System? Yes    Comments Nicholas [name] has a  problem with his son and is not on speaking. He can look to his wife as a great support system. His sleep has been doing well and takes medication.      Barriers   Psychosocial barriers to participate in program The patient should benefit from training in stress management and relaxation.      Screening Interventions   Interventions Encouraged to exercise;To provide support and resources with identified psychosocial needs;Provide feedback about the scores to participant    Expected Outcomes Short Term goal: Utilizing psychosocial counselor, staff and physician to assist with identification of specific Stressors or current issues interfering with healing process. Setting desired goal for each stressor or current issue identified.;Long Term Goal: Stressors or current issues are controlled or eliminated.;Short Term goal: Identification and review with participant of any Quality of Life or Depression concerns found by scoring the questionnaire.;Long Term goal: The participant improves quality of Life and PHQ9 Scores as seen by post scores and/or verbalization of changes             Quality of Life Scores:  Scores of 19 and below usually indicate a poorer quality of life in these areas.  A difference of  2-3 points is a clinically meaningful difference.  A difference of 2-3 points in the total score of the Quality of Life Index has been associated with significant improvement in overall quality of life, self-image, physical symptoms, and general health in studies assessing change in quality of life.  PHQ-9: Review Flowsheet  More data exists      11/09/2022 10/23/2022 10/18/2022 10/02/2022 06/06/2022  Depression screen PHQ 2/9  Decreased Interest 0 1 1 1 1   Down, Depressed, Hopeless 1 1 1 1 1   PHQ - 2 Score 1 2 2 2 2   Altered sleeping 0 1 0 0 1  Tired, decreased energy 2 3 3 3 1   Change in appetite 1 1 1  0 0  Feeling bad or failure about yourself  1 1 2  0 0  Trouble concentrating 0 0 0 0 0   Moving slowly or fidgety/restless 0 1 0 0 0  Suicidal thoughts 0 0 1 0 0  PHQ-9 Score 5 9 9 5 4   Difficult doing work/chores Not difficult at all Very difficult Very difficult Somewhat difficult -    Details           Interpretation of Total Score  Total Score Depression Severity:  1-4 = Minimal depression, 5-9 = Mild depression, 10-14 = Moderate depression, 15-19 = Moderately severe depression, 20-27 = Severe depression   Psychosocial Evaluation and Intervention:  Psychosocial Evaluation - 10/09/22 1027       Psychosocial Evaluation & Interventions   Interventions Relaxation education;Encouraged to exercise with the program and follow exercise prescription;Stress management education    Comments Nicholas [name] has a problem with his son and is not on speaking. He  can look to his wife as a great support system. His sleep has been doing well and takes medication.    Expected Outcomes Short: Start [facility] to help with mood. Long: Maintain a healthy mental state.    Continue Psychosocial Services  Follow up required by staff             Psychosocial Re-Evaluation:  Psychosocial Re-Evaluation     Row Name 11/09/22 0935             Psychosocial Re-Evaluation   Current issues with Current Stress Concerns       Comments Reviewed patient health questionnaire (PHQ-9) with patient for follow up. Previously, patients score indicated signs/symptoms of depression.  Reviewed to see if patient is improving symptom wise while in program.  Score improved and patient states that it is because he has been able to workout and have a little more energy.       Expected Outcomes Short: Continue to attend [facility] regularly for regular exercise and social engagement. Long: Continue to improve symptoms and manage a positive mental state.       Interventions Encouraged to attend Pulmonary Rehabilitation for the exercise       Continue Psychosocial Services  Follow up required by staff                 Psychosocial Discharge (Final Psychosocial Re-Evaluation):  Psychosocial Re-Evaluation - 11/09/22 0935       Psychosocial Re-Evaluation   Current issues with Current Stress Concerns    Comments Reviewed patient health questionnaire (PHQ-9) with patient for follow up. Previously, patients score indicated signs/symptoms of depression.  Reviewed to see if patient is improving symptom wise while in program.  Score improved and patient states that it is because he has been able to workout and have a little more energy.    Expected Outcomes Short: Continue to attend [facility] regularly for regular exercise and social engagement. Long: Continue to improve symptoms and manage a positive mental state.    Interventions Encouraged to attend Pulmonary Rehabilitation for the exercise    Continue Psychosocial Services  Follow up required by staff             Education: Education Goals: Education classes will be provided on a weekly basis, covering required topics. Participant will state understanding/return demonstration of topics presented.  Learning Barriers/Preferences:  Learning Barriers/Preferences - 10/09/22 1022       Learning Barriers/Preferences   Learning Barriers None    Learning Preferences None             General Pulmonary Education Topics:  Infection Prevention: - Provides verbal and written material to individual with discussion of infection control including proper hand washing and proper equipment cleaning during exercise session. Flowsheet Row Pulmonary Rehab from 11/16/2022 in [facility] Cardiac and Pulmonary Rehab  Date 10/18/22  Educator [facility]  Instruction Review Code 1- Verbalizes Understanding       Falls Prevention: - Provides verbal and written material to individual with discussion of falls prevention and safety. Flowsheet Row Pulmonary Rehab from 11/16/2022 in [facility] Cardiac and Pulmonary Rehab  Date 10/18/22  Educator [facility]  Instruction Review Code 1-  Verbalizes Understanding       Chronic Lung Disease Review: - Group verbal instruction with posters, models, PowerPoint presentations and videos,  to review new updates, new respiratory medications, new advancements in procedures and treatments. Providing information on websites and "800" numbers for continued self-education. Includes information about supplement oxygen, available portable oxygen  systems, continuous and intermittent flow rates, oxygen safety, concentrators, and Medicare reimbursement for oxygen. Explanation of Pulmonary Drugs, including class, frequency, complications, importance of spacers, rinsing mouth after steroid MDI's, and proper cleaning methods for nebulizers. Review of basic lung anatomy and physiology related to function, structure, and complications of lung disease. Review of risk factors. Discussion about methods for diagnosing sleep apnea and types of masks and machines for OSA. Includes a review of the use of types of environmental controls: home humidity, furnaces, filters, dust mite/pet prevention, HEPA vacuums. Discussion about weather changes, air quality and the benefits of nasal washing. Instruction on Warning signs, infection symptoms, calling MD promptly, preventive modes, and value of vaccinations. Review of effective airway clearance, coughing and/or vibration techniques. Emphasizing that all should Create an Action Plan. Written material given at graduation. Flowsheet Row Pulmonary Rehab from 11/16/2022 in [facility] Cardiac and Pulmonary Rehab  Education need identified 10/18/22  Date 10/19/22  Educator [facility]  Instruction Review Code 1- Verbalizes Understanding       AED/CPR: - Group verbal and written instruction with the use of models to demonstrate the basic use of the AED with the basic ABC's of resuscitation.    Anatomy and Cardiac Procedures: - Group verbal and visual presentation and models provide information about basic cardiac anatomy and function.  Reviews the testing methods done to diagnose heart disease and the outcomes of the test results. Describes the treatment choices: Medical Management, Angioplasty, or Coronary Bypass Surgery for treating various heart conditions including Myocardial Infarction, Angina, Valve Disease, and Cardiac Arrhythmias.  Written material given at graduation.   Medication Safety: - Group verbal and visual instruction to review commonly prescribed medications for heart and lung disease. Reviews the medication, class of the drug, and side effects. Includes the steps to properly store meds and maintain the prescription regimen.  Written material given at graduation.   Other: -Provides group and verbal instruction on various topics (see comments)   Knowledge Questionnaire Score:  Knowledge Questionnaire Score - 10/18/22 1252       Knowledge Questionnaire Score   Pre Score 15/18              Core Components/Risk Factors/Patient Goals at Admission:  Personal Goals and Risk Factors at Admission - 10/18/22 1251       Core Components/Risk Factors/Patient Goals on Admission    Weight Management Yes;Weight Loss    Intervention Weight Management: Develop a combined nutrition and exercise program designed to reach desired caloric intake, while maintaining appropriate intake of nutrient and fiber, sodium and fats, and appropriate energy expenditure required for the weight goal.;Weight Management: Provide education and appropriate resources to help participant work on and attain dietary goals.;Weight Management/Obesity: Establish reasonable short term and long term weight goals.;Obesity: Provide education and appropriate resources to help participant work on and attain dietary goals.    Admit Weight 280 lb 6.4 oz (127.2 kg)    Goal Weight: Short Term 270 lb (122.5 kg)    Goal Weight: Long Term 225 lb (102.1 kg)    Expected Outcomes Short Term: Continue to assess and modify interventions until short term weight  is achieved;Long Term: Adherence to nutrition and physical activity/exercise program aimed toward attainment of established weight goal;Weight Loss: Understanding of general recommendations for a balanced deficit meal plan, which promotes 1-2 lb weight loss per week and includes a negative energy balance of [phone] kcal/d;Understanding recommendations for meals to include 15-35% energy as protein, 25-35% energy from fat, 35-60% energy from carbohydrates,  less than 200mg  of dietary cholesterol, 20-35 gm of total fiber daily;Understanding of distribution of calorie intake throughout the day with the consumption of 4-5 meals/snacks    Improve shortness of breath with ADL's Yes    Intervention Provide education, individualized exercise plan and daily activity instruction to help decrease symptoms of SOB with activities of daily living.    Expected Outcomes Short Term: Improve cardiorespiratory fitness to achieve a reduction of symptoms when performing ADLs;Long Term: Be able to perform more ADLs without symptoms or delay the onset of symptoms    Diabetes Yes    Intervention Provide education about signs/symptoms and action to take for hypo/hyperglycemia.;Provide education about proper nutrition, including hydration, and aerobic/resistive exercise prescription along with prescribed medications to achieve blood glucose in normal ranges: Fasting glucose 65-99 mg/dL    Expected Outcomes Short Term: Participant verbalizes understanding of the signs/symptoms and immediate care of hyper/hypoglycemia, proper foot care and importance of medication, aerobic/resistive exercise and nutrition plan for blood glucose control.;Long Term: Attainment of HbA1C < 7%.    Hypertension Yes    Intervention Provide education on lifestyle modifcations including regular physical activity/exercise, weight management, moderate sodium restriction and increased consumption of fresh fruit, vegetables, and low fat dairy, alcohol moderation,  and smoking cessation.;Monitor prescription use compliance.    Expected Outcomes Short Term: Continued assessment and intervention until BP is < 140/26mm HG in hypertensive participants. < 130/60mm HG in hypertensive participants with diabetes, heart failure or chronic kidney disease.;Long Term: Maintenance of blood pressure at goal levels.    Lipids Yes    Intervention Provide education and support for participant on nutrition & aerobic/resistive exercise along with prescribed medications to achieve LDL 70mg , HDL >40mg .    Expected Outcomes Short Term: Participant states understanding of desired cholesterol values and is compliant with medications prescribed. Participant is following exercise prescription and nutrition guidelines.;Long Term: Cholesterol controlled with medications as prescribed, with individualized exercise RX and with personalized nutrition plan. Value goals: LDL < 70mg , HDL > 40 mg.             Education:Diabetes - Individual verbal and written instruction to review signs/symptoms of diabetes, desired ranges of glucose level fasting, after meals and with exercise. Acknowledge that pre and post exercise glucose checks will be done for 3 sessions at entry of program. Flowsheet Row Pulmonary Rehab from 11/16/2022 in [facility] Cardiac and Pulmonary Rehab  Date 10/18/22  Educator [facility]  Instruction Review Code 1- Verbalizes Understanding       Know Your Numbers and Heart Failure: - Group verbal and visual instruction to discuss disease risk factors for cardiac and pulmonary disease and treatment options.  Reviews associated critical values for Overweight/Obesity, Hypertension, Cholesterol, and Diabetes.  Discusses basics of heart failure: signs/symptoms and treatments.  Introduces Heart Failure Zone chart for action plan for heart failure.  Written material given at graduation. Flowsheet Row Pulmonary Rehab from 11/16/2022 in [facility] Cardiac and Pulmonary Rehab  Date 10/26/22  Educator  SB  Instruction Review Code 1- Verbalizes Understanding       Core Components/Risk Factors/Patient Goals Review:   Goals and Risk Factor Review     Row Name 11/09/22 0930             Core Components/Risk Factors/Patient Goals Review   Personal Goals Review Improve shortness of breath with ADL's       Review Spoke to patient about their shortness of breath and what they can do to improve. Patient has been informed of  breathing techniques when starting the program. Patient is informed to tell staff if they have had any med changes and that certain meds they are taking or not taking can be causing shortness of breath.       Expected Outcomes Short: Attend [facility] regularly to improve shortness of breath with ADL's. Long: maintain independence with ADL's                Core Components/Risk Factors/Patient Goals at Discharge (Final Review):   Goals and Risk Factor Review - 11/09/22 0930       Core Components/Risk Factors/Patient Goals Review   Personal Goals Review Improve shortness of breath with ADL's    Review Spoke to patient about their shortness of breath and what they can do to improve. Patient has been informed of breathing techniques when starting the program. Patient is informed to tell staff if they have had any med changes and that certain meds they are taking or not taking can be causing shortness of breath.    Expected Outcomes Short: Attend [facility] regularly to improve shortness of breath with ADL's. Long: maintain independence with ADL's             ITP Comments:  ITP Comments     Row Name 10/09/22 1024 10/18/22 1233 10/19/22 1007 10/25/22 1233 11/22/22 0848   ITP Comments Virtual Visit completed. Patient informed on EP and RD appointment and 6 Minute walk test. Patient also informed of patient health questionnaires on My Chart. Patient Verbalizes understanding. Visit diagnosis can be found in [facility] 09/18/2022. Completed and gym orientation. Initial ITP  created and sent for review to Dr. Jinny Sanders, Medical Director. First full day of exercise!  Patient was oriented to gym and equipment including functions, settings, policies, and procedures.  Patient's individual exercise prescription and treatment plan were reviewed.  All starting workloads were established based on the results of the 6 minute walk test done at initial orientation visit.  The plan for exercise progression was also introduced and progression will be customized based on patient's performance and goals. 30 Day review completed. Medical Director ITP review done, changes made as directed, and signed approval by Medical Director.    new to program 30 Day review completed. Medical Director ITP review done, changes made as directed, and signed approval by Medical Director.            Comments:

## 2022-11-22 NOTE — Telephone Encounter (Signed)
  Chief Complaint: urinary symptoms Symptoms: pain with urination Frequency: started 1 week ago Pertinent Negatives: Patient denies fever Disposition: [] ED /[] Urgent Care (no appt availability in office) / [x] Appointment(In office/virtual)/ []  [place] [facility]/ [] Home Care/ [] Refused Recommended Disposition /[] [place] [facility]/ []  Follow-up with PCP Additional Notes: Patient has been scheduled for appointment with provider

## 2022-11-22 NOTE — Progress Notes (Signed)
BP (!) 145/87 (BP Location: Left Arm, Cuff Size: Normal)   Pulse 78   Ht 5\' 11"  (1.803 m)   Wt 278 lb 12.8 oz (126.5 kg)   SpO2 98%   BMI 38.88 kg/m    Subjective:    Patient ID: Nicholas [name], male    DOB: Feb 05, 1945, 77 y.o.   MRN: [id number]  HPI: Nicholas [name] is a 77 y.o. male  Chief Complaint  Patient presents with   Urinary Tract Infection    Patient says this week and last week he had some pain with urination. Patient declines having any back pain or abdominal pain. Patient denies trying any over the counter medication. Patient says he only has the discomfort when he uses the restroom.    Medication Refill    Patient is requesting a refill on Nystatin at today's visit.    URINARY SYMPTOMS Duration: about 8 days Dysuria: yes Urinary frequency: yes Urgency: yes Small volume voids: no Symptom severity: moderate Urinary incontinence: no Foul odor: yes Hematuria: no Abdominal pain: yes Back pain: no Suprapubic pain/pressure: yes Flank pain: no Fever:  no Vomiting: no Relief with cranberry juice: no Relief with pyridium: no Status: stable Previous urinary tract infection: no Recurrent urinary tract infection: no History of sexually transmitted disease: no Penile discharge: no Treatments attempted: increasing fluids    Relevant past medical, surgical, family and social history reviewed and updated as indicated. Interim medical history since our last visit reviewed. Allergies and medications reviewed and updated.  Review of Systems  Constitutional: Negative.   Respiratory: Negative.    Cardiovascular: Negative.   Genitourinary:  Positive for dysuria, frequency and urgency. Negative for decreased urine volume, difficulty urinating, enuresis, flank pain, genital sores, hematuria, penile discharge, penile pain, penile swelling, scrotal swelling and testicular pain.  Musculoskeletal: Negative.   Psychiatric/Behavioral: Negative.      Per HPI unless  specifically indicated above     Objective:    BP (!) 145/87 (BP Location: Left Arm, Cuff Size: Normal)   Pulse 78   Ht 5\' 11"  (1.803 m)   Wt 278 lb 12.8 oz (126.5 kg)   SpO2 98%   BMI 38.88 kg/m   Wt Readings from Last 3 Encounters:  11/22/22 278 lb 12.8 oz (126.5 kg)  11/14/22 273 lb 9.6 oz (124.1 kg)  10/23/22 276 lb 12.8 oz (125.6 kg)    Physical Exam Vitals and nursing note reviewed.  Constitutional:      General: He is not in acute distress.    Appearance: Normal appearance. He is obese. He is not ill-appearing, toxic-appearing or diaphoretic.  HENT:     Head: Normocephalic and atraumatic.     Right Ear: External ear normal.     Left Ear: External ear normal.     Nose: Nose normal.     Mouth/Throat:     Mouth: Mucous membranes are moist.     Pharynx: Oropharynx is clear.  Eyes:     General: No scleral icterus.       Right eye: No discharge.        Left eye: No discharge.     Extraocular Movements: Extraocular movements intact.     Conjunctiva/sclera: Conjunctivae normal.     Pupils: Pupils are equal, round, and reactive to light.  Cardiovascular:     Rate and Rhythm: Normal rate and regular rhythm.     Pulses: Normal pulses.     Heart sounds: Normal heart sounds. No murmur heard.  No friction rub. No gallop.  Pulmonary:     Effort: Pulmonary effort is normal. No respiratory distress.     Breath sounds: Normal breath sounds. No stridor. No wheezing, rhonchi or rales.  Chest:     Chest wall: No tenderness.  Musculoskeletal:        General: Normal range of motion.     Cervical back: Normal range of motion and neck supple.  Skin:    General: Skin is warm and dry.     Capillary Refill: Capillary refill takes less than 2 seconds.     Coloration: Skin is not jaundiced or pale.     Findings: No bruising, erythema, lesion or rash.  Neurological:     General: No focal deficit present.     Mental Status: He is alert and oriented to person, place, and time. Mental  status is at baseline.  Psychiatric:        Mood and Affect: Mood normal.        Behavior: Behavior normal.        Thought Content: Thought content normal.        Judgment: Judgment normal.     Results for orders placed or performed in visit on 10/26/22  Glucose, capillary  Result Value Ref Range   Glucose-Capillary 234 (H) 70 - 99 mg/dL  Glucose, capillary  Result Value Ref Range   Glucose-Capillary 117 (H) 70 - 99 mg/dL      Assessment & Plan:   Problem List Items Addressed This Visit       Other   Dysuria   Relevant Orders   Urinalysis, Routine w reflex microscopic   Other Visit Diagnoses     Acute cystitis without hematuria    -  Primary   Last UTI was 1 month ago. Will treat with bactrim and send for culture. Return in about 10 days to recheck urine and confirm resolution. Call with any concerns.   Relevant Orders   Urinalysis, Routine w reflex microscopic   Urine Culture   Urine Culture        Follow up plan: Return As scheduled with Jolene.

## 2022-11-22 NOTE — Telephone Encounter (Signed)
Urinary pain Reason for Disposition  All other males with painful urination  Answer Assessment - Initial Assessment Questions 1. SEVERITY: "How bad is the pain?"  (e.g., Scale 1-10; mild, moderate, or severe)   - MILD (1-3): Complains slightly about urination hurting.   - MODERATE (4-7): Interferes with normal activities.     - SEVERE (8-10): Excruciating, unwilling or unable to urinate because of the pain.      mild 2. FREQUENCY: "How many times have you had painful urination today?"      Every time 3. PATTERN: "Is pain present every time you urinate or just sometimes?"      Every time 4. ONSET: "When did the painful urination start?"      1 week 5. FEVER: "Do you have a fever?" If Yes, ask: "What is your temperature, how was it measured, and when did it start?"     no 6. PAST UTI: "Have you had a urine infection before?" If Yes, ask: "When was the last time?" and "What happened that time?"      Yes- 1 month- was treated with antibiotic 7. CAUSE: "What do you think is causing the painful urination?"      Possible UTI 8. OTHER SYMPTOMS: "Do you have any other symptoms?" (e.g., flank pain, penis discharge, scrotal pain, blood in urine)     no  Protocols used: Urination Pain - Male-A-AH

## 2022-11-23 LAB — MICROSCOPIC EXAMINATION

## 2022-11-23 LAB — URINALYSIS, ROUTINE W REFLEX MICROSCOPIC
Bilirubin, UA: NEGATIVE
Glucose, UA: NEGATIVE
Nitrite, UA: POSITIVE — AB
Specific Gravity, UA: 1.02 (ref 1.005–1.030)
Urobilinogen, Ur: 1 mg/dL (ref 0.2–1.0)
pH, UA: 6 (ref 5.0–7.5)

## 2022-11-24 ENCOUNTER — Encounter: Payer: Medicare Other | Attending: Internal Medicine | Admitting: *Deleted

## 2022-11-24 DIAGNOSIS — M5416 Radiculopathy, lumbar region: Secondary | ICD-10-CM | POA: Insufficient documentation

## 2022-11-24 DIAGNOSIS — M47896 Other spondylosis, lumbar region: Secondary | ICD-10-CM | POA: Diagnosis not present

## 2022-11-24 DIAGNOSIS — M1611 Unilateral primary osteoarthritis, right hip: Secondary | ICD-10-CM | POA: Insufficient documentation

## 2022-11-24 DIAGNOSIS — M79642 Pain in left hand: Secondary | ICD-10-CM | POA: Insufficient documentation

## 2022-11-24 DIAGNOSIS — M5431 Sciatica, right side: Secondary | ICD-10-CM | POA: Diagnosis not present

## 2022-11-24 DIAGNOSIS — G894 Chronic pain syndrome: Secondary | ICD-10-CM | POA: Diagnosis present

## 2022-11-24 DIAGNOSIS — M5432 Sciatica, left side: Secondary | ICD-10-CM | POA: Diagnosis not present

## 2022-11-24 DIAGNOSIS — R0609 Other forms of dyspnea: Secondary | ICD-10-CM | POA: Diagnosis not present

## 2022-11-24 DIAGNOSIS — M79641 Pain in right hand: Secondary | ICD-10-CM | POA: Insufficient documentation

## 2022-11-24 DIAGNOSIS — M47816 Spondylosis without myelopathy or radiculopathy, lumbar region: Secondary | ICD-10-CM | POA: Diagnosis not present

## 2022-11-24 DIAGNOSIS — M62830 Muscle spasm of back: Secondary | ICD-10-CM | POA: Diagnosis not present

## 2022-11-24 DIAGNOSIS — Z79891 Long term (current) use of opiate analgesic: Secondary | ICD-10-CM | POA: Insufficient documentation

## 2022-11-24 DIAGNOSIS — M47817 Spondylosis without myelopathy or radiculopathy, lumbosacral region: Secondary | ICD-10-CM | POA: Diagnosis not present

## 2022-11-24 NOTE — Progress Notes (Signed)
Daily Session Note  Patient Details  Name: Nicholas [name] MRN: [id number] Date of Birth: Aug 12, 1945 Referring Provider:   Flowsheet Row Pulmonary Rehab from 10/18/2022 in [facility] Cardiac and Pulmonary Rehab  Referring Provider Dr. Birdena Jubilee       Encounter Date: 11/24/2022  Check In:  Session Check In - 11/24/22 0858       Check-In   Supervising physician immediately available to respond to emergencies See telemetry face sheet for immediately available ER MD    Location [facility]-Cardiac & Pulmonary Rehab    Staff Present Ronette Deter, BS, Exercise Physiologist;Joseph Shelbie Proctor, RN, [place]    Virtual Visit No    Medication changes reported     No    Fall or balance concerns reported    No    Warm-up and Cool-down Performed on first and last piece of equipment    Resistance Training Performed Yes    VAD Patient? No    PAD/SET Patient? No      Pain Assessment   Currently in Pain? No/denies                Social History   Tobacco Use  Smoking Status Never  Smokeless Tobacco Never    Goals Met:  Independence with exercise equipment Exercise tolerated well No report of concerns or symptoms today Strength training completed today  Goals Unmet:  Not Applicable  Comments: Pt able to follow exercise prescription today without complaint.  Will continue to monitor for progression.    Dr. Bethann Punches is Medical Director for [facility] Cardiac Rehabilitation.  Dr. Vida Rigger is Medical Director for [facility] Pulmonary Rehabilitation.

## 2022-11-28 ENCOUNTER — Encounter: Payer: Medicare Other | Admitting: *Deleted

## 2022-11-28 DIAGNOSIS — G894 Chronic pain syndrome: Secondary | ICD-10-CM | POA: Diagnosis not present

## 2022-11-28 DIAGNOSIS — R0609 Other forms of dyspnea: Secondary | ICD-10-CM

## 2022-11-28 NOTE — Progress Notes (Signed)
Daily Session Note  Patient Details  Name: Nicholas [name] MRN: [id number] Date of Birth: 1945/06/11 Referring Provider:   Flowsheet Row Pulmonary Rehab from 10/18/2022 in [facility] Cardiac and Pulmonary Rehab  Referring Provider Dr. Birdena Jubilee       Encounter Date: 11/28/2022  Check In:  Session Check In - 11/28/22 0937       Check-In   Supervising physician immediately available to respond to emergencies See telemetry face sheet for immediately available ER MD    Location [facility]-Cardiac & Pulmonary Rehab    Staff Present Cora Collum, RN, BSN, CCRP;Noah Tickle, BS, Exercise Physiologist;Margaret Best, MS, Exercise Physiologist;Maxon Conetta BS, , Exercise Physiologist    Virtual Visit No    Medication changes reported     No    Fall or balance concerns reported    No    Warm-up and Cool-down Performed on first and last piece of equipment    Resistance Training Performed Yes    VAD Patient? No    PAD/SET Patient? No      Pain Assessment   Currently in Pain? No/denies                Social History   Tobacco Use  Smoking Status Never  Smokeless Tobacco Never    Goals Met:  Proper associated with RPD/PD & O2 Sat Independence with exercise equipment Exercise tolerated well No report of concerns or symptoms today  Goals Unmet:  Not Applicable  Comments: Pt able to follow exercise prescription today without complaint.  Will continue to monitor for progression.    Dr. Bethann Punches is Medical Director for [facility] Cardiac Rehabilitation.  Dr. Vida Rigger is Medical Director for [facility] Pulmonary Rehabilitation.

## 2022-11-29 LAB — CUP PACEART INCLINIC DEVICE CHECK
Date Time Interrogation Session: 20241022182128
Implantable Lead Connection Status: 753985
Implantable Lead Connection Status: 753985
Implantable Lead Implant Date: 20180124
Implantable Lead Implant Date: 20180124
Implantable Lead Location: 753859
Implantable Lead Location: 753860
Implantable Lead Model: 3830
Implantable Lead Model: 5076
Implantable Pulse Generator Implant Date: 20180124

## 2022-11-30 ENCOUNTER — Encounter: Payer: Medicare Other | Admitting: *Deleted

## 2022-11-30 DIAGNOSIS — R0609 Other forms of dyspnea: Secondary | ICD-10-CM

## 2022-11-30 DIAGNOSIS — G894 Chronic pain syndrome: Secondary | ICD-10-CM | POA: Diagnosis not present

## 2022-11-30 NOTE — Progress Notes (Signed)
Daily Session Note  Patient Details  Name: Nicholas [name] MRN: [id number] Date of Birth: 11-28-1945 Referring Provider:   Flowsheet Row Pulmonary Rehab from 10/18/2022 in [facility] Cardiac and Pulmonary Rehab  Referring Provider Dr. Birdena Jubilee       Encounter Date: 11/30/2022  Check In:  Session Check In - 11/30/22 0937       Check-In   Supervising physician immediately available to respond to emergencies See telemetry face sheet for immediately available ER MD    Location [facility]-Cardiac & Pulmonary Rehab    Staff Present Ronette Deter, BS, Exercise Physiologist;Susanne Bice, RN, BSN, CCRP;Margaret Best, MS, Exercise Physiologist;Elius Etheredge Katrinka Blazing, RN, ADN    Virtual Visit No    Medication changes reported     No    Fall or balance concerns reported    No    Warm-up and Cool-down Performed on first and last piece of equipment    Resistance Training Performed Yes    VAD Patient? No    PAD/SET Patient? No      Pain Assessment   Currently in Pain? No/denies                Social History   Tobacco Use  Smoking Status Never  Smokeless Tobacco Never    Goals Met:  Independence with exercise equipment Exercise tolerated well No report of concerns or symptoms today Strength training completed today  Goals Unmet:  Not Applicable  Comments: Pt able to follow exercise prescription today without complaint.  Will continue to monitor for progression.    Dr. Bethann Punches is Medical Director for [facility] Cardiac Rehabilitation.  Dr. Vida Rigger is Medical Director for [facility] Pulmonary Rehabilitation.

## 2022-12-03 ENCOUNTER — Other Ambulatory Visit: Payer: Self-pay | Admitting: Internal Medicine

## 2022-12-05 DIAGNOSIS — G894 Chronic pain syndrome: Secondary | ICD-10-CM | POA: Diagnosis not present

## 2022-12-05 DIAGNOSIS — R0609 Other forms of dyspnea: Secondary | ICD-10-CM

## 2022-12-05 NOTE — Progress Notes (Signed)
Daily Session Note  Patient Details  Name: Nicholas [name] MRN: [id number] Date of Birth: 06-19-1945 Referring Provider:   Flowsheet Row Pulmonary Rehab from 10/18/2022 in [facility] Cardiac and Pulmonary Rehab  Referring Provider Dr. Birdena Jubilee       Encounter Date: 12/05/2022  Check In:  Session Check In - 12/05/22 0924       Check-In   Supervising physician immediately available to respond to emergencies See telemetry face sheet for immediately available ER MD    Location [facility]-Cardiac & Pulmonary Rehab    Staff Present Maxon Conetta BS, , Exercise Physiologist;Demetres Prochnow, RN, BSN;Margaret Best, MS, Exercise Physiologist;Noah Tickle, BS, Exercise Physiologist    Virtual Visit No    Medication changes reported     No    Fall or balance concerns reported    No    Warm-up and Cool-down Performed on first and last piece of equipment    Resistance Training Performed Yes    VAD Patient? No    PAD/SET Patient? No      Pain Assessment   Currently in Pain? No/denies                Social History   Tobacco Use  Smoking Status Never  Smokeless Tobacco Never    Goals Met:  Independence with exercise equipment Exercise tolerated well No report of concerns or symptoms today Strength training completed today  Goals Unmet:  Not Applicable  Comments: Pt able to follow exercise prescription today without complaint.  Will continue to monitor for progression.   Dr. Bethann Punches is Medical Director for [facility] Cardiac Rehabilitation.  Dr. Vida Rigger is Medical Director for [facility] Pulmonary Rehabilitation.

## 2022-12-06 LAB — URINE CULTURE

## 2022-12-07 ENCOUNTER — Encounter: Payer: Medicare Other | Admitting: *Deleted

## 2022-12-07 DIAGNOSIS — G894 Chronic pain syndrome: Secondary | ICD-10-CM | POA: Diagnosis not present

## 2022-12-07 DIAGNOSIS — R0609 Other forms of dyspnea: Secondary | ICD-10-CM

## 2022-12-07 NOTE — Progress Notes (Signed)
Daily Session Note  Patient Details  Name: Nicholas [name] MRN: [id number] Date of Birth: 1945-03-04 Referring Provider:   Flowsheet Row Pulmonary Rehab from 10/18/2022 in [facility] Cardiac and Pulmonary Rehab  Referring Provider Dr. Birdena Jubilee       Encounter Date: 12/07/2022  Check In:  Session Check In - 12/07/22 0927       Check-In   Supervising physician immediately available to respond to emergencies See telemetry face sheet for immediately available ER MD    Location [facility]-Cardiac & Pulmonary Rehab    Staff Present Cora Collum, RN, BSN, CCRP;Margaret Best, MS, Exercise Physiologist;Cheyann Blecha Katrinka Blazing, RN, ADN    Virtual Visit No    Medication changes reported     No    Fall or balance concerns reported    No    Warm-up and Cool-down Performed on first and last piece of equipment    Resistance Training Performed Yes    VAD Patient? No    PAD/SET Patient? No      Pain Assessment   Currently in Pain? No/denies                Social History   Tobacco Use  Smoking Status Never  Smokeless Tobacco Never    Goals Met:  Independence with exercise equipment Exercise tolerated well No report of concerns or symptoms today Strength training completed today  Goals Unmet:  Not Applicable  Comments: Pt able to follow exercise prescription today without complaint.  Will continue to monitor for progression.    Dr. Bethann Punches is Medical Director for [facility] Cardiac Rehabilitation.  Dr. Vida Rigger is Medical Director for [facility] Pulmonary Rehabilitation.

## 2022-12-11 ENCOUNTER — Ambulatory Visit: Payer: Medicare Other

## 2022-12-11 DIAGNOSIS — I442 Atrioventricular block, complete: Secondary | ICD-10-CM | POA: Diagnosis not present

## 2022-12-12 ENCOUNTER — Encounter: Payer: Self-pay | Admitting: Anesthesiology

## 2022-12-12 ENCOUNTER — Ambulatory Visit (HOSPITAL_BASED_OUTPATIENT_CLINIC_OR_DEPARTMENT_OTHER): Payer: Medicare Other | Admitting: Anesthesiology

## 2022-12-12 ENCOUNTER — Encounter: Payer: Medicare Other | Admitting: *Deleted

## 2022-12-12 ENCOUNTER — Telehealth: Payer: Self-pay

## 2022-12-12 VITALS — BP 114/75 | HR 78 | Temp 98.1°F | Resp 16 | Ht 70.0 in | Wt 273.0 lb

## 2022-12-12 DIAGNOSIS — M5432 Sciatica, left side: Secondary | ICD-10-CM

## 2022-12-12 DIAGNOSIS — M47816 Spondylosis without myelopathy or radiculopathy, lumbar region: Secondary | ICD-10-CM | POA: Insufficient documentation

## 2022-12-12 DIAGNOSIS — G894 Chronic pain syndrome: Secondary | ICD-10-CM | POA: Insufficient documentation

## 2022-12-12 DIAGNOSIS — M79641 Pain in right hand: Secondary | ICD-10-CM | POA: Insufficient documentation

## 2022-12-12 DIAGNOSIS — M79642 Pain in left hand: Secondary | ICD-10-CM

## 2022-12-12 DIAGNOSIS — F1190 Opioid use, unspecified, uncomplicated: Secondary | ICD-10-CM | POA: Diagnosis not present

## 2022-12-12 DIAGNOSIS — M47817 Spondylosis without myelopathy or radiculopathy, lumbosacral region: Secondary | ICD-10-CM

## 2022-12-12 DIAGNOSIS — M62830 Muscle spasm of back: Secondary | ICD-10-CM

## 2022-12-12 DIAGNOSIS — R0609 Other forms of dyspnea: Secondary | ICD-10-CM

## 2022-12-12 DIAGNOSIS — M1611 Unilateral primary osteoarthritis, right hip: Secondary | ICD-10-CM

## 2022-12-12 DIAGNOSIS — M5431 Sciatica, right side: Secondary | ICD-10-CM

## 2022-12-12 DIAGNOSIS — M5416 Radiculopathy, lumbar region: Secondary | ICD-10-CM

## 2022-12-12 LAB — CUP PACEART REMOTE DEVICE CHECK
Battery Remaining Longevity: 5 mo
Battery Voltage: 2.87 V
Brady Statistic AP VP Percent: 38.69 %
Brady Statistic AP VS Percent: 0.01 %
Brady Statistic AS VP Percent: 61.27 %
Brady Statistic AS VS Percent: 0.03 %
Brady Statistic RA Percent Paced: 38.67 %
Brady Statistic RV Percent Paced: 99.96 %
Date Time Interrogation Session: 20241118122427
Implantable Lead Connection Status: 753985
Implantable Lead Connection Status: 753985
Implantable Lead Implant Date: 20180124
Implantable Lead Implant Date: 20180124
Implantable Lead Location: 753859
Implantable Lead Location: 753860
Implantable Lead Model: 3830
Implantable Lead Model: 5076
Implantable Pulse Generator Implant Date: 20180124
Lead Channel Impedance Value: 323 Ohm
Lead Channel Impedance Value: 342 Ohm
Lead Channel Impedance Value: 342 Ohm
Lead Channel Impedance Value: 437 Ohm
Lead Channel Pacing Threshold Amplitude: 0.5 V
Lead Channel Pacing Threshold Amplitude: 0.75 V
Lead Channel Pacing Threshold Pulse Width: 0.4 ms
Lead Channel Pacing Threshold Pulse Width: 0.4 ms
Lead Channel Sensing Intrinsic Amplitude: 0.75 mV
Lead Channel Sensing Intrinsic Amplitude: 0.75 mV
Lead Channel Sensing Intrinsic Amplitude: 3.5 mV
Lead Channel Sensing Intrinsic Amplitude: 3.5 mV
Lead Channel Setting Pacing Amplitude: 2 V
Lead Channel Setting Pacing Amplitude: 4 V
Lead Channel Setting Pacing Pulse Width: 1 ms
Lead Channel Setting Sensing Sensitivity: 0.6 mV
Zone Setting Status: 755011
Zone Setting Status: 755011

## 2022-12-12 NOTE — Progress Notes (Signed)
Daily Session Note  Patient Details  Name: Nicholas [name] MRN: [id number] Date of Birth: 22-Feb-1945 Referring Provider:   Flowsheet Row Pulmonary Rehab from 10/18/2022 in [facility] Cardiac and Pulmonary Rehab  Referring Provider Dr. Birdena Jubilee       Encounter Date: 12/12/2022  Check In:  Session Check In - 12/12/22 0936       Check-In   Supervising physician immediately available to respond to emergencies See telemetry face sheet for immediately available ER MD    Location [facility]-Cardiac & Pulmonary Rehab    Staff Present Cora Collum, RN, BSN, CCRP;Noah Tickle, BS, Exercise Physiologist;Margaret Best, MS, Exercise Physiologist;Maxon Conetta BS, , Exercise Physiologist    Virtual Visit No    Medication changes reported     No    Fall or balance concerns reported    No    Warm-up and Cool-down Performed on first and last piece of equipment    Resistance Training Performed Yes    VAD Patient? No    PAD/SET Patient? No      Pain Assessment   Currently in Pain? No/denies                Social History   Tobacco Use  Smoking Status Never  Smokeless Tobacco Never    Goals Met:  Proper associated with RPD/PD & O2 Sat Independence with exercise equipment Exercise tolerated well No report of concerns or symptoms today  Goals Unmet:  Not Applicable  Comments: Pt able to follow exercise prescription today without complaint.  Will continue to monitor for progression.    Dr. Bethann Punches is Medical Director for [facility] Cardiac Rehabilitation.  Dr. Vida Rigger is Medical Director for [facility] Pulmonary Rehabilitation.

## 2022-12-12 NOTE — Progress Notes (Unsigned)
Nursing Pain Medication Assessment:  Safety precautions to be maintained throughout the outpatient stay will include: orient to surroundings, keep bed in low position, maintain call bell within reach at all times, provide assistance with transfer out of bed and ambulation.  Medication Inspection Compliance: Pill count conducted under aseptic conditions, in front of the patient. Neither the pills nor the bottle was removed from the patient's sight at any time. Once count was completed pills were immediately returned to the patient in their original bottle.  Medication: Hydrocodone/APAP Pill/Patch Count:  19 of 60 pills remain Pill/Patch Appearance: Markings consistent with prescribed medication Bottle Appearance: Standard pharmacy container. Clearly labeled. Filled Date: 60 / 27 / 2024 Last Medication intake:  TodaySafety precautions to be maintained throughout the outpatient stay will include: orient to surroundings, keep bed in low position, maintain call bell within reach at all times, provide assistance with transfer out of bed and ambulation.

## 2022-12-12 NOTE — Telephone Encounter (Signed)
Alert received from [employer] Scheduled remote reviewed. Normal device function.   The device estimates 5 months until ERI, needs to start IFU, sent to triage  Spoke with pt's wife regarding pt's estimated 5 months until ERI. Advised of monthly battery checks will be scheduled. Also advised of upcoming appointment on 1/24.

## 2022-12-13 ENCOUNTER — Encounter: Payer: Self-pay | Admitting: *Deleted

## 2022-12-13 ENCOUNTER — Encounter: Payer: Self-pay | Admitting: Anesthesiology

## 2022-12-13 DIAGNOSIS — R0609 Other forms of dyspnea: Secondary | ICD-10-CM

## 2022-12-13 MED ORDER — HYDROCODONE-ACETAMINOPHEN 5-325 MG PO TABS: 1.0000 | ORAL_TABLET | Freq: Two times a day (BID) | ORAL | 0 refills | Status: AC

## 2022-12-13 MED ORDER — HYDROCODONE-ACETAMINOPHEN 5-325 MG PO TABS: 1.0000 | ORAL_TABLET | Freq: Two times a day (BID) | ORAL | 0 refills | Status: DC

## 2022-12-13 NOTE — Progress Notes (Signed)
Subjective:  Patient ID: Nicholas [name], male    DOB: Mar 14, 1945  Age: 77 y.o. MRN: [id number]  CC: Back Pain (lower)   Procedure: None  HPI Nicholas [name] presents for reevaluation.  He continues to have struggles with his low back pain.  The quality characteristic and distribution of this has been stable in nature with no recent changes.  His sciatica presents with a bilateral gnawing aching pain that radiates from the low back hips buttocks and into the calves.  It is worse with prolonged activity and standing.  It is without change in quality characteristic or distribution.  He still taking his medications as prescribed and these continue to work well for him.  Otherwise no change in strength or function is noted.  He is trying to do core strengthening exercises as best he can.  Otherwise he is in his usual state of health.  Outpatient Medications Prior to Visit  Medication Sig Dispense Refill   acetaminophen (TYLENOL) 650 MG CR tablet Take 650 mg by mouth every 8 (eight) hours as needed for pain.     albuterol (VENTOLIN HFA) 108 (90 Base) MCG/ACT inhaler TAKE 2 PUFFS BY MOUTH EVERY 6 HOURS AS NEEDED 6.7 each 0   apixaban (ELIQUIS) 5 MG TABS tablet Take 1 tablet (5 mg total) by mouth 2 (two) times daily. 180 tablet 1   Ascorbic Acid (VITAMIN C) 1000 MG tablet Take 1,000 mg by mouth daily.     Blood Glucose Monitoring Suppl (ONETOUCH VERIO) w/Device KIT Use to check blood sugar 3 times a day and document results, bring to appointments.  Goal is <130 fasting blood sugar and <180 two hours after meals. 1 kit 0   calcium-vitamin D (OSCAL WITH D) 500-5 MG-MCG tablet Take 1 tablet by mouth daily with breakfast.     Cranberry 500 MG CAPS Take 500 mg by mouth daily.     empagliflozin (JARDIANCE) 25 MG TABS tablet Take 1 tablet (25 mg total) by mouth daily. 30 tablet 4   gabapentin (NEURONTIN) 600 MG tablet TAKE 1 TABLET BY MOUTH EVERYDAY AT BEDTIME 90 tablet 4   glucose blood test strip Use to check  blood sugar 3 times a day and document results, bring to appointments.  Goal is <130 fasting blood sugar and <180 two hours after meals. 100 each 12   HYDROcodone-acetaminophen (NORCO/VICODIN) 5-325 MG tablet Take 1 tablet by mouth 2 (two) times daily. 60 tablet 0   Lancets (ONETOUCH ULTRASOFT) lancets Use to check blood sugar 3 times a day and document results, bring to appointments.  Goal is <130 fasting blood sugar and <180 two hours after meals. 100 each 12   LORazepam (ATIVAN) 1 MG tablet TAKE 1 TABLET BY MOUTH EVERYDAY AT BEDTIME 90 tablet 0   Magnesium 400 MG TABS Take 400 mg by mouth daily.      MELATONIN PO Take by mouth.     midodrine (PROAMATINE) 5 MG tablet Take 1 tablet (5 mg total) by mouth 3 (three) times daily with meals. 270 tablet 3   Multiple Vitamin (MULTIVITAMIN WITH MINERALS) TABS tablet Take 1 tablet by mouth daily. Centrum Silver     mupirocin ointment (BACTROBAN) 2 % Apply 1 Application topically 2 (two) times daily. 22 g 0   nystatin (MYCOSTATIN/NYSTOP) powder Apply 1 Application topically 3 (three) times daily. 60 g 2   nystatin cream (MYCOSTATIN) Apply 1 Application topically 2 (two) times daily. 30 g 4   Omega-3 Fatty Acids (FISH  OIL) 1200 MG CAPS Take 1,200 mg by mouth daily.     oxymetazoline (AFRIN) 0.05 % nasal spray Place 2 sprays into both nostrils at bedtime.     pantoprazole (PROTONIX) 20 MG tablet TAKE 1 TABLET (20 MG TOTAL) BY MOUTH 2 (TWO) TIMES DAILY BEFORE A MEAL. (Patient taking differently: Take 20 mg by mouth daily.) 180 tablet 2   potassium chloride SA (KLOR-CON M) 20 MEQ tablet Take 2 tablets (40 meq) with Torsemide 30 tablet 3   QUEtiapine Fumarate (SEROQUEL XR) 150 MG 24 hr tablet TAKE 1 TABLET BY MOUTH EVERYDAY AT BEDTIME 90 tablet 4   rOPINIRole (REQUIP) 0.25 MG tablet Take 0.25 mg by mouth 4 (four) times daily.     rosuvastatin (CRESTOR) 40 MG tablet TAKE 1 TABLET BY MOUTH EVERY DAY 90 tablet 1   solifenacin (VESICARE) 10 MG tablet TAKE 1 TABLET  BY MOUTH EVERY DAY 90 tablet 3   tiZANidine (ZANAFLEX) 4 MG tablet Take 1 tablet (4 mg total) by mouth every 6 (six) hours as needed for muscle spasms. 90 tablet 4   torsemide (DEMADEX) 20 MG tablet TAKE 2 TABLETS (40 MG TOTAL) BY MOUTH DAILY AS NEEDED (FLUID RETENTION). 90 tablet 0   venlafaxine XR (EFFEXOR-XR) 75 MG 24 hr capsule TAKE 3 CAPSULES BY MOUTH EVERY DAY 270 capsule 0   midodrine (PROAMATINE) 10 MG tablet TAKE 0.5 TABLETS (5 MG TOTAL) BY MOUTH 3 (THREE) TIMES DAILY. (Patient not taking: Reported on 12/12/2022) 135 tablet 3   sulfamethoxazole-trimethoprim (BACTRIM DS) 800-160 MG tablet Take 1 tablet by mouth 2 (two) times daily. (Patient not taking: Reported on 12/12/2022) 14 tablet 0   No facility-administered medications prior to visit.    Review of Systems CNS: No confusion or sedation Cardiac: No angina or palpitations GI: No abdominal pain or constipation Constitutional: No nausea vomiting fevers or chills  Objective:  BP 114/75   Pulse 78   Temp 98.1 F (36.7 C) (Temporal)   Resp 16   Ht 5\' 10"  (1.778 m)   Wt 273 lb (123.8 kg)   SpO2 98%   BMI 39.17 kg/m    BP Readings from Last 3 Encounters:  12/12/22 114/75  11/22/22 (!) 145/87  11/14/22 111/75     Wt Readings from Last 3 Encounters:  12/12/22 273 lb (123.8 kg)  11/22/22 278 lb 12.8 oz (126.5 kg)  11/14/22 273 lb 9.6 oz (124.1 kg)     Physical Exam Pt is alert and oriented PERRL EOMI HEART IS RRR no murmur or rub LCTA no wheezing or rales MUSCULOSKELETAL reveals some paraspinous muscle tenderness in the lumbar region but no overt trigger points.  Muscle tone and bulk to the lower extremities is at baseline.  He does have some mild swelling at the pretibial region consistent with some problems with some chronic diastolic dysfunction  Labs  Lab Results  Component Value Date   HGBA1C 7.0 (H) 10/02/2022   HGBA1C 7.0 (H) 05/30/2022   HGBA1C 6.8 (H) 03/01/2022   Lab Results  Component Value Date    MICROALBUR 80 (H) 03/23/2021   LDLCALC 117 (H) 10/02/2022   CREATININE 1.16 10/02/2022    -------------------------------------------------------------------------------------------------------------------- Lab Results  Component Value Date   WBC 6.3 04/26/2022   HGB 14.9 04/26/2022   HCT 43.4 04/26/2022   PLT 168 04/26/2022   GLUCOSE 177 (H) 10/02/2022   CHOL 191 10/02/2022   TRIG 183 (H) 10/02/2022   HDL 42 10/02/2022   LDLCALC 117 (H) 10/02/2022  ALT 22 05/30/2022   AST 29 05/30/2022   NA 140 10/02/2022   K 4.1 10/02/2022   CL 101 10/02/2022   CREATININE 1.16 10/02/2022   BUN 17 10/02/2022   CO2 24 10/02/2022   TSH 1.230 03/01/2022   INR 1.2 02/16/2014   HGBA1C 7.0 (H) 10/02/2022   MICROALBUR 80 (H) 03/23/2021    --------------------------------------------------------------------------------------------------------------------- No results found.   Assessment & Plan:   Roxas "Orvilla Fus" was seen today for back pain.  Diagnoses and all orders for this visit:  Chronic pain syndrome  Lumbar radiculopathy  Bilateral sciatica  Chronic, continuous use of opioids  Bilateral hand pain  Facet syndrome, lumbar  Osteoarthritis of right hip, unspecified osteoarthritis type  Spasm of muscle of lower back  Facet arthritis of lumbosacral region        ----------------------------------------------------------------------------------------------------------------------  Problem List Items Addressed This Visit       Unprioritized   Chronic pain syndrome - Primary (Chronic)   Chronic, continuous use of opioids   Facet syndrome, lumbar   Lumbar radiculopathy   Other Visit Diagnoses     Bilateral sciatica       Bilateral hand pain       Osteoarthritis of right hip, unspecified osteoarthritis type       Spasm of muscle of lower back       Facet arthritis of lumbosacral region              ----------------------------------------------------------------------------------------------------------------------  1. Chronic pain syndrome I have reviewed the [facility] practitioner database information is appropriate for refill.  2. Lumbar radiculopathy He continues to do well with chronic opioid management.  Refills will be generated for December 20 28 November 2024.  Return to clinic in 2 months.  3. Bilateral sciatica   Continue core stretching strengthening exercises and efforts at weight loss  4. Chronic, continuous use of opioids  as above  5. Bilateral hand pain   6. Facet syndrome, lumbar   7. Osteoarthritis of right hip, unspecified osteoarthritis type   8. Spasm of muscle of lower back   9. Facet arthritis of lumbosacral region     ----------------------------------------------------------------------------------------------------------------------  I am having Julies T. Litaker "Tommy" maintain his oxymetazoline, Fish Oil, Cranberry, Magnesium, acetaminophen, rOPINIRole, vitamin C, multivitamin with minerals, MELATONIN PO, tiZANidine, OneTouch Verio, onetouch ultrasoft, glucose blood, solifenacin, calcium-vitamin D, potassium chloride SA, empagliflozin, QUEtiapine Fumarate, torsemide, nystatin, albuterol, pantoprazole, rosuvastatin, gabapentin, mupirocin ointment, LORazepam, venlafaxine XR, HYDROcodone-acetaminophen, apixaban, midodrine, nystatin cream, sulfamethoxazole-trimethoprim, and midodrine.   No orders of the defined types were placed in this encounter.  Patient's Medications  New Prescriptions   No medications on file  Previous Medications   ACETAMINOPHEN (TYLENOL) 650 MG CR TABLET    Take 650 mg by mouth every 8 (eight) hours as needed for pain.   ALBUTEROL (VENTOLIN HFA) 108 (90 BASE) MCG/ACT INHALER    TAKE 2 PUFFS BY MOUTH EVERY 6 HOURS AS NEEDED   APIXABAN (ELIQUIS) 5 MG TABS TABLET    Take 1 tablet (5 mg total) by mouth 2 (two)  times daily.   ASCORBIC ACID (VITAMIN C) 1000 MG TABLET    Take 1,000 mg by mouth daily.   BLOOD GLUCOSE MONITORING SUPPL (ONETOUCH VERIO) W/DEVICE KIT    Use to check blood sugar 3 times a day and document results, bring to appointments.  Goal is <130 fasting blood sugar and <180 two hours after meals.   CALCIUM-VITAMIN D (OSCAL WITH D) 500-5 MG-MCG TABLET    Take  1 tablet by mouth daily with breakfast.   CRANBERRY 500 MG CAPS    Take 500 mg by mouth daily.   EMPAGLIFLOZIN (JARDIANCE) 25 MG TABS TABLET    Take 1 tablet (25 mg total) by mouth daily.   GABAPENTIN (NEURONTIN) 600 MG TABLET    TAKE 1 TABLET BY MOUTH EVERYDAY AT BEDTIME   GLUCOSE BLOOD TEST STRIP    Use to check blood sugar 3 times a day and document results, bring to appointments.  Goal is <130 fasting blood sugar and <180 two hours after meals.   HYDROCODONE-ACETAMINOPHEN (NORCO/VICODIN) 5-325 MG TABLET    Take 1 tablet by mouth 2 (two) times daily.   LANCETS (ONETOUCH ULTRASOFT) LANCETS    Use to check blood sugar 3 times a day and document results, bring to appointments.  Goal is <130 fasting blood sugar and <180 two hours after meals.   LORAZEPAM (ATIVAN) 1 MG TABLET    TAKE 1 TABLET BY MOUTH EVERYDAY AT BEDTIME   MAGNESIUM 400 MG TABS    Take 400 mg by mouth daily.    MELATONIN PO    Take by mouth.   MIDODRINE (PROAMATINE) 10 MG TABLET    TAKE 0.5 TABLETS (5 MG TOTAL) BY MOUTH 3 (THREE) TIMES DAILY.   MIDODRINE (PROAMATINE) 5 MG TABLET    Take 1 tablet (5 mg total) by mouth 3 (three) times daily with meals.   MULTIPLE VITAMIN (MULTIVITAMIN WITH MINERALS) TABS TABLET    Take 1 tablet by mouth daily. Centrum Silver   MUPIROCIN OINTMENT (BACTROBAN) 2 %    Apply 1 Application topically 2 (two) times daily.   NYSTATIN (MYCOSTATIN/NYSTOP) POWDER    Apply 1 Application topically 3 (three) times daily.   NYSTATIN CREAM (MYCOSTATIN)    Apply 1 Application topically 2 (two) times daily.   OMEGA-3 FATTY ACIDS (FISH OIL) 1200 MG CAPS     Take 1,200 mg by mouth daily.   OXYMETAZOLINE (AFRIN) 0.05 % NASAL SPRAY    Place 2 sprays into both nostrils at bedtime.   PANTOPRAZOLE (PROTONIX) 20 MG TABLET    TAKE 1 TABLET (20 MG TOTAL) BY MOUTH 2 (TWO) TIMES DAILY BEFORE A MEAL.   POTASSIUM CHLORIDE SA (KLOR-CON M) 20 MEQ TABLET    Take 2 tablets (40 meq) with Torsemide   QUETIAPINE FUMARATE (SEROQUEL XR) 150 MG 24 HR TABLET    TAKE 1 TABLET BY MOUTH EVERYDAY AT BEDTIME   ROPINIROLE (REQUIP) 0.25 MG TABLET    Take 0.25 mg by mouth 4 (four) times daily.   ROSUVASTATIN (CRESTOR) 40 MG TABLET    TAKE 1 TABLET BY MOUTH EVERY DAY   SOLIFENACIN (VESICARE) 10 MG TABLET    TAKE 1 TABLET BY MOUTH EVERY DAY   SULFAMETHOXAZOLE-TRIMETHOPRIM (BACTRIM DS) 800-160 MG TABLET    Take 1 tablet by mouth 2 (two) times daily.   TIZANIDINE (ZANAFLEX) 4 MG TABLET    Take 1 tablet (4 mg total) by mouth every 6 (six) hours as needed for muscle spasms.   TORSEMIDE (DEMADEX) 20 MG TABLET    TAKE 2 TABLETS (40 MG TOTAL) BY MOUTH DAILY AS NEEDED (FLUID RETENTION).   VENLAFAXINE XR (EFFEXOR-XR) 75 MG 24 HR CAPSULE    TAKE 3 CAPSULES BY MOUTH EVERY DAY  Modified Medications   No medications on file  Discontinued Medications   No medications on file   ----------------------------------------------------------------------------------------------------------------------  Follow-up: No follow-ups on file.  30  Yevette Edwards, MD

## 2022-12-13 NOTE — Progress Notes (Signed)
Pulmonary Individual Treatment Plan  Patient Details  Name: CORY KNOTT MRN: [id number] Date of Birth: 1945-10-01 Referring Provider:   Flowsheet Row Pulmonary Rehab from 10/18/2022 in [facility] Cardiac and Pulmonary Rehab  Referring Provider Dr. Birdena Jubilee       Initial Encounter Date:  Flowsheet Row Pulmonary Rehab from 10/18/2022 in [facility] Cardiac and Pulmonary Rehab  Date 10/18/22       Visit Diagnosis: DOE (dyspnea on exertion)  Patient's Home Medications on Admission:  Current Outpatient Medications:    acetaminophen (TYLENOL) 650 MG CR tablet, Take 650 mg by mouth every 8 (eight) hours as needed for pain., Disp: , Rfl:    albuterol (VENTOLIN HFA) 108 (90 Base) MCG/ACT inhaler, TAKE 2 PUFFS BY MOUTH EVERY 6 HOURS AS NEEDED, Disp: 6.7 each, Rfl: 0   apixaban (ELIQUIS) 5 MG TABS tablet, Take 1 tablet (5 mg total) by mouth 2 (two) times daily., Disp: 180 tablet, Rfl: 1   Ascorbic Acid (VITAMIN C) 1000 MG tablet, Take 1,000 mg by mouth daily., Disp: , Rfl:    Blood Glucose Monitoring Suppl (ONETOUCH VERIO) w/Device KIT, Use to check blood sugar 3 times a day and document results, bring to appointments.  Goal is <130 fasting blood sugar and <180 two hours after meals., Disp: 1 kit, Rfl: 0   calcium-vitamin D (OSCAL WITH D) 500-5 MG-MCG tablet, Take 1 tablet by mouth daily with breakfast., Disp: , Rfl:    Cranberry 500 MG CAPS, Take 500 mg by mouth daily., Disp: , Rfl:    empagliflozin (JARDIANCE) 25 MG TABS tablet, Take 1 tablet (25 mg total) by mouth daily., Disp: 30 tablet, Rfl: 4   gabapentin (NEURONTIN) 600 MG tablet, TAKE 1 TABLET BY MOUTH EVERYDAY AT BEDTIME, Disp: 90 tablet, Rfl: 4   glucose blood test strip, Use to check blood sugar 3 times a day and document results, bring to appointments.  Goal is <130 fasting blood sugar and <180 two hours after meals., Disp: 100 each, Rfl: 12   HYDROcodone-acetaminophen (NORCO/VICODIN) 5-325 MG tablet, Take 1 tablet by mouth 2 (two)  times daily., Disp: 60 tablet, Rfl: 0   Lancets (ONETOUCH ULTRASOFT) lancets, Use to check blood sugar 3 times a day and document results, bring to appointments.  Goal is <130 fasting blood sugar and <180 two hours after meals., Disp: 100 each, Rfl: 12   LORazepam (ATIVAN) 1 MG tablet, TAKE 1 TABLET BY MOUTH EVERYDAY AT BEDTIME, Disp: 90 tablet, Rfl: 0   Magnesium 400 MG TABS, Take 400 mg by mouth daily. , Disp: , Rfl:    MELATONIN PO, Take by mouth., Disp: , Rfl:    midodrine (PROAMATINE) 10 MG tablet, TAKE 0.5 TABLETS (5 MG TOTAL) BY MOUTH 3 (THREE) TIMES DAILY. (Patient not taking: Reported on 12/12/2022), Disp: 135 tablet, Rfl: 3   midodrine (PROAMATINE) 5 MG tablet, Take 1 tablet (5 mg total) by mouth 3 (three) times daily with meals., Disp: 270 tablet, Rfl: 3   Multiple Vitamin (MULTIVITAMIN WITH MINERALS) TABS tablet, Take 1 tablet by mouth daily. Centrum Silver, Disp: , Rfl:    mupirocin ointment (BACTROBAN) 2 %, Apply 1 Application topically 2 (two) times daily., Disp: 22 g, Rfl: 0   nystatin (MYCOSTATIN/NYSTOP) powder, Apply 1 Application topically 3 (three) times daily., Disp: 60 g, Rfl: 2   nystatin cream (MYCOSTATIN), Apply 1 Application topically 2 (two) times daily., Disp: 30 g, Rfl: 4   Omega-3 Fatty Acids (FISH OIL) 1200 MG CAPS, Take 1,200 mg by  mouth daily., Disp: , Rfl:    oxymetazoline (AFRIN) 0.05 % nasal spray, Place 2 sprays into both nostrils at bedtime., Disp: , Rfl:    pantoprazole (PROTONIX) 20 MG tablet, TAKE 1 TABLET (20 MG TOTAL) BY MOUTH 2 (TWO) TIMES DAILY BEFORE A MEAL. (Patient taking differently: Take 20 mg by mouth daily.), Disp: 180 tablet, Rfl: 2   potassium chloride SA (KLOR-CON M) 20 MEQ tablet, Take 2 tablets (40 meq) with Torsemide, Disp: 30 tablet, Rfl: 3   QUEtiapine Fumarate (SEROQUEL XR) 150 MG 24 hr tablet, TAKE 1 TABLET BY MOUTH EVERYDAY AT BEDTIME, Disp: 90 tablet, Rfl: 4   rOPINIRole (REQUIP) 0.25 MG tablet, Take 0.25 mg by mouth 4 (four) times  daily., Disp: , Rfl:    rosuvastatin (CRESTOR) 40 MG tablet, TAKE 1 TABLET BY MOUTH EVERY DAY, Disp: 90 tablet, Rfl: 1   solifenacin (VESICARE) 10 MG tablet, TAKE 1 TABLET BY MOUTH EVERY DAY, Disp: 90 tablet, Rfl: 3   sulfamethoxazole-trimethoprim (BACTRIM DS) 800-160 MG tablet, Take 1 tablet by mouth 2 (two) times daily. (Patient not taking: Reported on 12/12/2022), Disp: 14 tablet, Rfl: 0   tiZANidine (ZANAFLEX) 4 MG tablet, Take 1 tablet (4 mg total) by mouth every 6 (six) hours as needed for muscle spasms., Disp: 90 tablet, Rfl: 4   torsemide (DEMADEX) 20 MG tablet, TAKE 2 TABLETS (40 MG TOTAL) BY MOUTH DAILY AS NEEDED (FLUID RETENTION)., Disp: 90 tablet, Rfl: 0   venlafaxine XR (EFFEXOR-XR) 75 MG 24 hr capsule, TAKE 3 CAPSULES BY MOUTH EVERY DAY, Disp: 270 capsule, Rfl: 0  Past Medical History: Past Medical History:  Diagnosis Date   Abdominal pain, chronic, right lower quadrant 12/13/2020   Anterior urethral stricture    Anxiety    Arthritis    a. knees, hips, hands;  b. 11/2013 s/p L TKA @ [facility].   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 Dr Graciela Husbands   Depression    DVT (deep venous thrombosis) (HCC)    Erosive esophagitis    Gross hematuria    Hyperlipemia    Hypertension    borderline   Internal hemorrhoids    Phimosis    Pulmonary embolism (HCC)     Tobacco Use: Social History   Tobacco Use  Smoking Status Never  Smokeless Tobacco Never    Labs: Review Flowsheet  More data exists      Latest Ref Rng & Units 08/24/2021 11/29/2021 03/01/2022 05/30/2022 10/02/2022  Labs for ITP Cardiac and Pulmonary Rehab  Cholestrol 100 - 199 mg/dL 130  865  784  696  295   LDL (calc) 0 - 99 mg/dL 59  63  58  59  284   HDL-C >39 mg/dL 55  49  58  49  42   Trlycerides 0 - 149 mg/dL 77  132  97  440  102   Hemoglobin A1c 4.8 - 5.6 % 5.9  6.8  6.8  7.0  7.0      Details             Pulmonary Assessment Scores:  Pulmonary Assessment Scores     Row Name 10/18/22 1252         ADL UCSD   ADL Phase Entry     SOB Score total 89     Rest 0  Walk 5     Stairs 5     Bath 5     Dress 4     Shop 5       CAT Score   CAT Score 20       mMRC Score   mMRC Score 3              UCSD: Self-administered rating of dyspnea associated with activities of daily living (ADLs) 6-point scale (0 = "not at all" to 5 = "maximal or unable to do because of breathlessness")  Scoring Scores range from 0 to 120.  Minimally important difference is 5 units  CAT: CAT can identify the health impairment of COPD patients and is better correlated with disease progression.  CAT has a scoring range of zero to 40. The CAT score is classified into four groups of low (less than 10), medium (10 - 20), high (21-30) and very high (31-40) based on the impact level of disease on health status. A CAT score over 10 suggests significant symptoms.  A worsening CAT score could be explained by an exacerbation, poor medication adherence, poor inhaler technique, or progression of COPD or comorbid conditions.  CAT MCID is 2 points  mMRC: mMRC (Modified Medical Research Council) Dyspnea Scale is used to assess the degree of baseline functional disability in patients of respiratory disease due to dyspnea. No minimal important difference is established. A decrease in score of 1 point or greater is considered a positive change.   Pulmonary Function Assessment:  Pulmonary Function Assessment - 10/09/22 1023       Breath   Shortness of Breath Yes;Limiting activity             Exercise Target Goals: Exercise Program Goal: Individual exercise prescription set using results from initial 6 min walk test and THRR while considering  patient's activity barriers and safety.   Exercise Prescription Goal: Initial exercise prescription builds to 30-45 minutes a day of aerobic  activity, 2-3 days per week.  Home exercise guidelines will be given to patient during program as part of exercise prescription that the participant will acknowledge.  Education: Aerobic Exercise: - Group verbal and visual presentation on the components of exercise prescription. Introduces F.I.T.T principle from ACSM for exercise prescriptions.  Reviews F.I.T.T. principles of aerobic exercise including progression. Written material given at graduation.   Education: Resistance Exercise: - Group verbal and visual presentation on the components of exercise prescription. Introduces F.I.T.T principle from ACSM for exercise prescriptions  Reviews F.I.T.T. principles of resistance exercise including progression. Written material given at graduation.    Education: Exercise & Equipment Safety: - Individual verbal instruction and demonstration of equipment use and safety with use of the equipment. Flowsheet Row Pulmonary Rehab from 12/07/2022 in [facility] Cardiac and Pulmonary Rehab  Date 10/18/22  Educator [facility]  Instruction Review Code 1- Verbalizes Understanding       Education: Exercise Physiology & General Exercise Guidelines: - Group verbal and written instruction with models to review the exercise physiology of the cardiovascular system and associated critical values. Provides general exercise guidelines with specific guidelines to those with heart or lung disease.  Flowsheet Row Pulmonary Rehab from 12/07/2022 in [facility] Cardiac and Pulmonary Rehab  Date 11/09/22  Educator [facility]  Instruction Review Code 1- [employer] Understanding       Education: Flexibility, Balance, Mind/Body Relaxation: - Group verbal and visual presentation with interactive activity on the components of exercise prescription. Introduces F.I.T.T principle from ACSM for exercise prescriptions. Reviews  F.I.T.T. principles of flexibility and balance exercise training including progression. Also discusses the mind body connection.   Reviews various relaxation techniques to help reduce and manage stress (i.e. Deep breathing, progressive muscle relaxation, and visualization). Balance handout provided to take home. Written material given at graduation.   Activity Barriers & Risk Stratification:  Activity Barriers & Cardiac Risk Stratification - 10/18/22 1236       Activity Barriers & Cardiac Risk Stratification   Activity Barriers Right Hip Replacement;Left Knee Replacement;Left Hip Replacement;Right Knee Replacement             6 Minute Walk:  6 Minute Walk     Row Name 10/18/22 1233         6 Minute Walk   Phase Initial     Distance 825 feet     Walk Time 6 minutes     # of Rest Breaks 0     MPH 1.56     METS 1.07     RPE 13     Perceived Dyspnea  3     VO2 Peak 3.76     Symptoms Yes (comment)     Comments SOB     Resting HR 99 bpm     Resting BP 110/60     Resting Oxygen Saturation  95 %     Exercise Oxygen Saturation  during 6 min walk 93 %     Max Ex. HR 107 bpm     Max Ex. BP 128/70     2 Minute Post BP 118/76       Interval HR   1 Minute HR 81     2 Minute HR 70     3 Minute HR 84     4 Minute HR 82     5 Minute HR 77     6 Minute HR 107     2 Minute Post HR 102     Interval Heart Rate? Yes       Interval Oxygen   Interval Oxygen? Yes     Baseline Oxygen Saturation % 95 %     1 Minute Oxygen Saturation % 93 %     1 Minute Liters of Oxygen 0 L     2 Minute Oxygen Saturation % 94 %     2 Minute Liters of Oxygen 0 L     3 Minute Oxygen Saturation % 93 %     3 Minute Liters of Oxygen 0 L     4 Minute Oxygen Saturation % 93 %     4 Minute Liters of Oxygen 0 L     5 Minute Oxygen Saturation % 94 %     5 Minute Liters of Oxygen 0 L     6 Minute Oxygen Saturation % 94 %     6 Minute Liters of Oxygen 0 L     2 Minute Post Oxygen Saturation % 94 %     2 Minute Post Liters of Oxygen 0 L             Oxygen Initial Assessment:  Oxygen Initial Assessment - 10/18/22 1252        Home Oxygen   Home Oxygen Device None    Sleep Oxygen Prescription CPAP    Liters per minute 0    Home Exercise Oxygen Prescription None    Home Resting Oxygen Prescription None    Compliance with Home Oxygen Use Yes      Initial 6 min  Walk   Oxygen Used None      Program Oxygen Prescription   Program Oxygen Prescription None      Intervention   Short Term Goals To learn and exhibit compliance with exercise, home and travel O2 prescription;To learn and understand importance of monitoring SPO2 with pulse oximeter and demonstrate accurate use of the pulse oximeter.;To learn and understand importance of maintaining oxygen saturations>88%;To learn and demonstrate proper pursed lip breathing techniques or other breathing techniques. ;To learn and demonstrate proper use of respiratory medications    Long  Term Goals Exhibits compliance with exercise, home  and travel O2 prescription;Verbalizes importance of monitoring SPO2 with pulse oximeter and return demonstration;Maintenance of O2 saturations>88%;Exhibits proper breathing techniques, such as pursed lip breathing or other method taught during program session;Compliance with respiratory medication;Demonstrates proper use of MDI's             Oxygen Re-Evaluation:  Oxygen Re-Evaluation     Row Name 11/09/22 0928 11/30/22 0952           Program Oxygen Prescription   Program Oxygen Prescription None None        Home Oxygen   Home Oxygen Device None None      Sleep Oxygen Prescription CPAP CPAP      Liters per minute 0 0      Home Exercise Oxygen Prescription None None      Home Resting Oxygen Prescription None None      Compliance with Home Oxygen Use Yes Yes        Goals/Expected Outcomes   Short Term Goals To learn and demonstrate proper pursed lip breathing techniques or other breathing techniques.  To learn and demonstrate proper pursed lip breathing techniques or other breathing techniques.       Long  Term Goals Exhibits  proper breathing techniques, such as pursed lip breathing or other method taught during program session Exhibits proper breathing techniques, such as pursed lip breathing or other method taught during program session      Comments Informed patient how to perform the Pursed Lipped breathing technique. Told patient to Inhale through the nose and out the mouth with pursed lips to keep their airways open, help oxygenate them better, practice when at rest or doing strenuous activity. Patient Verbalizes understanding of technique and will work on and be reiterated during [facility]. Reviewed PLB techique with pt. He states that he continues to practice PLB at rest and during exercise. He reports that his pulmonologist also encouraged him to contnue to eat correctly and exercise to improve SOB.      Goals/Expected Outcomes Short: use PLB with exertion. Long: use PLB on exertion proficiently and independently. Short: Continue to use PLB with exertion. Long: Become proficient and independent with PLB.               Oxygen Discharge (Final Oxygen Re-Evaluation):  Oxygen Re-Evaluation - 11/30/22 0952       Program Oxygen Prescription   Program Oxygen Prescription None      Home Oxygen   Home Oxygen Device None    Sleep Oxygen Prescription CPAP    Liters per minute 0    Home Exercise Oxygen Prescription None    Home Resting Oxygen Prescription None    Compliance with Home Oxygen Use Yes      Goals/Expected Outcomes   Short Term Goals To learn and demonstrate proper pursed lip breathing techniques or other breathing techniques.     Long  Term Goals Exhibits  proper breathing techniques, such as pursed lip breathing or other method taught during program session    Comments Reviewed PLB techique with pt. He states that he continues to practice PLB at rest and during exercise. He reports that his pulmonologist also encouraged him to contnue to eat correctly and exercise to improve SOB.    Goals/Expected  Outcomes Short: Continue to use PLB with exertion. Long: Become proficient and independent with PLB.             Initial Exercise Prescription:  Initial Exercise Prescription - 10/18/22 1200       Date of Initial Exercise RX and Referring Provider   Date 10/18/22    Referring Provider Dr. Birdena Jubilee      Oxygen   Maintain Oxygen Saturation 88% or higher      NuStep   Level 1   T6   SPM 80    Minutes 15    METs 1.07      REL-XR   Level 1    Speed 50    Minutes 15    METs 1.07      Track   Laps 10    Minutes 15    METs 1.54      Prescription Details   Frequency (times per week) 2    Duration Progress to 30 minutes of continuous aerobic without signs/symptoms of physical distress      Intensity   THRR 40-80% of Max Heartrate 116-134    Ratings of Perceived Exertion 11-13    Perceived Dyspnea 0-4      Progression   Progression Continue to progress workloads to maintain intensity without signs/symptoms of physical distress.      Resistance Training   Training Prescription Yes    Weight 3    Reps 10-15             Perform Capillary Blood Glucose checks as needed.  Exercise Prescription Changes:   Exercise Prescription Changes     Row Name 10/18/22 1200 10/19/22 1000 10/26/22 1600 11/09/22 1600 11/22/22 1100     Response to Exercise   Blood Pressure (Admit) 110/60 110/60 114/64 120/68 124/74   Blood Pressure (Exercise) 128/70 128/70 128/70 136/68 150/70   Blood Pressure (Exit) 118/76 118/76 130/78 108/58 122/62   Heart Rate (Admit) 99 bpm 99 bpm 98 bpm 87 bpm 86 bpm   Heart Rate (Exercise) 107 bpm 107 bpm 98 bpm 108 bpm 109 bpm   Heart Rate (Exit) 92 bpm 92 bpm 99 bpm 94 bpm 97 bpm   Oxygen Saturation (Admit) 95 % 95 % 93 % 94 % 93 %   Oxygen Saturation (Exercise) 93 % 93 % 89 % 91 % 91 %   Oxygen Saturation (Exit) 94 % 94 % 93 % 94 % 93 %   Rating of Perceived Exertion (Exercise) 13 13 15 15 15    Perceived Dyspnea (Exercise) 3 3 2 2 1     Symptoms SOB SOB -- SOB SOB   Comments 6 MWT results 6 MWT results -- -- --   Duration -- -- Continue with 30 min of aerobic exercise without signs/symptoms of physical distress. Continue with 30 min of aerobic exercise without signs/symptoms of physical distress. Continue with 30 min of aerobic exercise without signs/symptoms of physical distress.   Intensity -- -- THRR unchanged THRR unchanged THRR unchanged     Progression   Progression -- -- Continue to progress workloads to maintain intensity without signs/symptoms of physical distress. Continue to  progress workloads to maintain intensity without signs/symptoms of physical distress. Continue to progress workloads to maintain intensity without signs/symptoms of physical distress.   Average METs -- -- -- 2.21 2.49     Resistance Training   Training Prescription -- Yes Yes Yes Yes   Weight -- 3 3 3  lb 3 lb   Reps -- 10-15 10-15 10-15 10-15     Interval Training   Interval Training -- -- -- No No     NuStep   Level -- 1  T6 1  T6 4  T6 nustep 4  T6 nustep   SPM -- 80 80 -- --   Minutes -- 15 15 15 15    METs -- 1.07 2.4 2.6 2.9     REL-XR   Level -- 1 1 2 4    Speed -- 50 50 -- --   Minutes -- 15 15 15 15    METs -- 1.07 1.07 3.4 2.2     Track   Laps -- 10 -- 15  Hallway 24  hallway   Minutes -- 15 -- 15 15   METs -- 1.54 -- 1.65 2.03     Oxygen   Maintain Oxygen Saturation -- -- -- 88% or higher 88% or higher    Row Name 12/07/22 1500             Response to Exercise   Blood Pressure (Admit) 120/72       Blood Pressure (Exercise) 130/70       Blood Pressure (Exit) 138/80       Heart Rate (Admit) 80 bpm       Heart Rate (Exercise) 113 bpm       Heart Rate (Exit) 91 bpm       Oxygen Saturation (Admit) 91 %       Oxygen Saturation (Exercise) 91 %       Oxygen Saturation (Exit) 93 %       Rating of Perceived Exertion (Exercise) 13       Perceived Dyspnea (Exercise) 1       Symptoms none       Duration Continue  with 30 min of aerobic exercise without signs/symptoms of physical distress.       Intensity THRR unchanged         Progression   Progression Continue to progress workloads to maintain intensity without signs/symptoms of physical distress.       Average METs 2.9         Resistance Training   Training Prescription Yes       Weight 3 lb       Reps 10-15         Interval Training   Interval Training No         NuStep   Level 4  T6       Minutes 15       METs 3.1         REL-XR   Level 4       Minutes 15       METs 4.2         Biostep-RELP   Level 3       Minutes 15       METs 3         Track   Laps 30       Minutes 15       METs 2.63         Oxygen   Maintain Oxygen Saturation  88% or higher                Exercise Comments:   Exercise Comments     Row Name 10/19/22 1007           Exercise Comments First full day of exercise!  Patient was oriented to gym and equipment including functions, settings, policies, and procedures.  Patient's individual exercise prescription and treatment plan were reviewed.  All starting workloads were established based on the results of the 6 minute walk test done at initial orientation visit.  The plan for exercise progression was also introduced and progression will be customized based on patient's performance and goals.                Exercise Goals and Review:   Exercise Goals     Row Name 10/18/22 1247             Exercise Goals   Increase Physical Activity Yes       Intervention Provide advice, education, support and counseling about physical activity/exercise needs.;Develop an individualized exercise prescription for aerobic and resistive training based on initial evaluation findings, risk stratification, comorbidities and participant's personal goals.       Expected Outcomes Short Term: Attend rehab on a regular basis to increase amount of physical activity.;Long Term: Add in home exercise to make exercise part  of routine and to increase amount of physical activity.;Long Term: Exercising regularly at least 3-5 days a week.       Increase Strength and Stamina Yes       Intervention Provide advice, education, support and counseling about physical activity/exercise needs.;Develop an individualized exercise prescription for aerobic and resistive training based on initial evaluation findings, risk stratification, comorbidities and participant's personal goals.       Expected Outcomes Short Term: Increase workloads from initial exercise prescription for resistance, speed, and METs.;Short Term: Perform resistance training exercises routinely during rehab and add in resistance training at home;Long Term: Improve cardiorespiratory fitness, muscular endurance and strength as measured by increased METs and functional capacity ( )       Able to understand and use rate of perceived exertion (RPE) scale Yes       Intervention Provide education and explanation on how to use RPE scale       Expected Outcomes Short Term: Able to use RPE daily in rehab to express subjective intensity level;Long Term:  Able to use RPE to guide intensity level when exercising independently       Able to understand and use Dyspnea scale Yes       Intervention Provide education and explanation on how to use Dyspnea scale       Expected Outcomes Short Term: Able to use Dyspnea scale daily in rehab to express subjective sense of shortness of breath during exertion;Long Term: Able to use Dyspnea scale to guide intensity level when exercising independently       Knowledge and understanding of Target Heart Rate Range (THRR) Yes       Intervention Provide education and explanation of THRR including how the numbers were predicted and where they are located for reference       Expected Outcomes Short Term: Able to state/look up THRR;Long Term: Able to use THRR to govern intensity when exercising independently;Short Term: Able to use daily as guideline  for intensity in rehab       Able to check pulse independently Yes       Intervention Provide education and demonstration  on how to check pulse in carotid and radial arteries.;Review the importance of being able to check your own pulse for safety during independent exercise       Expected Outcomes Short Term: Able to explain why pulse checking is important during independent exercise;Long Term: Able to check pulse independently and accurately       Understanding of Exercise Prescription Yes       Intervention Provide education, explanation, and written materials on patient's individual exercise prescription       Expected Outcomes Short Term: Able to explain program exercise prescription;Long Term: Able to explain home exercise prescription to exercise independently                Exercise Goals Re-Evaluation :  Exercise Goals Re-Evaluation     Row Name 10/19/22 1007 10/26/22 1700 11/09/22 1603 11/22/22 1107 12/07/22 1512     Exercise Goal Re-Evaluation   Exercise Goals Review Able to understand and use rate of perceived exertion (RPE) scale;Able to understand and use Dyspnea scale;Knowledge and understanding of Target Heart Rate Range (THRR);Understanding of Exercise Prescription Increase Physical Activity;Understanding of Exercise Prescription;Increase Strength and Stamina Increase Physical Activity;Increase Strength and Stamina;Understanding of Exercise Prescription Increase Physical Activity;Increase Strength and Stamina;Understanding of Exercise Prescription Increase Physical Activity;Increase Strength and Stamina;Understanding of Exercise Prescription   Comments Reviewed RPE and dyspnea scale, THR and program prescription with pt today.  Pt voiced understanding and was given a copy of goals to take home. Nicholas [name] is off to a good start in the program. He has completed one exercise session on this review. On his first day, he was able to follow his exercise prescription with the T6, and only  was able to do 5 minutes on the XR. We will continue to monitor his progress in the program. Nicholas [name] is doing well in rehab. He has increased his laps walking to 15 laps in the hallway. He also improved to level 4 on the T6 nustep and level 2 on the XR. He continues to do well with 3 lb handweights for resistance training as well. We will continue to monitor his progress in the program. Nicholas [name] continues to do well in rehab. He recently increased his number of laps walked in the hallways to 24 laps! He also increased to level 4 on the XR and stayed consistent at level 4 on the T6 nustep. We will continue to monitor his progress in the program. Nicholas [name] continues to make improvements in rehab. He has been able to increase his number of laps in the hallway to 30!! He was also able to maintain level 4 on the T6 nustep. We will continue to monitor his progress in the program.   Expected Outcomes Short: Use RPE daily to regulate intensity. Long: Follow program prescription in THR. Short: Continue to follow current exercise prescription. Long: Continue exercise to improve strength and stamina. Short: Continue to push for more laps on the track. Long: Continue exercise to improve strength and stamina. Short: Continue to progressively increase workloads. Long: Continue exercise to improve strength and stamina. Short: Continue to progressively increase workloads. Long: Continue exercise to improve strength and stamina.            Discharge Exercise Prescription (Final Exercise Prescription Changes):  Exercise Prescription Changes - 12/07/22 1500       Response to Exercise   Blood Pressure (Admit) 120/72    Blood Pressure (Exercise) 130/70    Blood Pressure (Exit) 138/80    Heart Rate (Admit) 80  bpm    Heart Rate (Exercise) 113 bpm    Heart Rate (Exit) 91 bpm    Oxygen Saturation (Admit) 91 %    Oxygen Saturation (Exercise) 91 %    Oxygen Saturation (Exit) 93 %    Rating of Perceived Exertion (Exercise) 13     Perceived Dyspnea (Exercise) 1    Symptoms none    Duration Continue with 30 min of aerobic exercise without signs/symptoms of physical distress.    Intensity THRR unchanged      Progression   Progression Continue to progress workloads to maintain intensity without signs/symptoms of physical distress.    Average METs 2.9      Resistance Training   Training Prescription Yes    Weight 3 lb    Reps 10-15      Interval Training   Interval Training No      NuStep   Level 4   T6   Minutes 15    METs 3.1      REL-XR   Level 4    Minutes 15    METs 4.2      Biostep-RELP   Level 3    Minutes 15    METs 3      Track   Laps 30    Minutes 15    METs 2.63      Oxygen   Maintain Oxygen Saturation 88% or higher             Nutrition:  Target Goals: Understanding of nutrition guidelines, daily intake of sodium 1500mg , cholesterol 200mg , calories 30% from fat and 7% or less from saturated fats, daily to have 5 or more servings of fruits and vegetables.  Education: All About Nutrition: -Group instruction provided by verbal, written material, interactive activities, discussions, models, and posters to present general guidelines for heart healthy nutrition including fat, fiber, MyPlate, the role of sodium in heart healthy nutrition, utilization of the nutrition label, and utilization of this knowledge for meal planning. Follow up email sent as well. Written material given at graduation. Flowsheet Row Pulmonary Rehab from 12/07/2022 in [facility] Cardiac and Pulmonary Rehab  Date 12/07/22  Educator JG  Instruction Review Code 1- Verbalizes Understanding       Biometrics:  Pre Biometrics - 10/18/22 1248       Pre Biometrics   Height 5' 11.5" (1.816 m)    Weight 280 lb 6.4 oz (127.2 kg)    Waist Circumference 51 inches    Hip Circumference 53.5 inches    Waist to Hip Ratio 0.95 %    BMI (Calculated) 38.57    Single Leg Stand 1.6 seconds              Nutrition  Therapy Plan and Nutrition Goals:  Nutrition Therapy & Goals - 10/18/22 1319       Nutrition Therapy   Diet Cardiac, Low Na    Protein (specify units) 75    Fiber 30 grams    Whole Grain Foods 3 servings    Saturated Fats 15 max. grams    Fruits and Vegetables 5 servings/day    Sodium 2 grams      Personal Nutrition Goals   Nutrition Goal Drink 2 bottles of water daily    Personal Goal #2 Eat something nutrient/calorie dense at every meal hour    Personal Goal #3 Pair a complex carb with a protein or healthy fat    Comments Patient drinking ~20oz of water daily. Spoke about the  importance of hydration and set short term goal of drinking 2 bottles per day (~32oz), with a long term goal of ~64oz daily. He reports that he likes to snack on sweets, but his appetite has been less than it used to be, often not missing a meal or getting full quickly. Reviewed mediterranean diet handout, educated on types of fats, sources, how to read food label. Educated on how healthy fats can be heart healthy and high in calories. Explain how choosing calorie dense foods such as peanut butter, olives, olives or avocado oils. Reviewed his 24hr food recall, explained how he should look to pair complex carbs with a protein or healthy fats when making meals or snacks. Brainstormed and built out a few example meals and snacks focusing on getting enough nutrition with his poor appetite.      Intervention Plan   Intervention Prescribe, educate and counsel regarding individualized specific dietary modifications aiming towards targeted core components such as weight, hypertension, lipid management, diabetes, heart failure and other comorbidities.;Nutrition handout(s) given to patient.    Expected Outcomes Short Term Goal: Understand basic principles of dietary content, such as calories, fat, sodium, cholesterol and nutrients.;Short Term Goal: A plan has been developed with personal nutrition goals set during dietitian  appointment.;Long Term Goal: Adherence to prescribed nutrition plan.             Nutrition Assessments:  MEDIFICTS Score Key: >=70 Need to make dietary changes  40-70 Heart Healthy Diet <= 40 Therapeutic Level Cholesterol Diet  Flowsheet Row Pulmonary Rehab from 10/18/2022 in [facility] Cardiac and Pulmonary Rehab  Picture Your Plate Total Score on Admission 58      Picture Your Plate Scores: <56 Unhealthy dietary pattern with much room for improvement. 41-50 Dietary pattern unlikely to meet recommendations for good health and room for improvement. 51-60 More healthful dietary pattern, with some room for improvement.  >60 Healthy dietary pattern, although there may be some specific behaviors that could be improved.   Nutrition Goals Re-Evaluation:  Nutrition Goals Re-Evaluation     Row Name 11/09/22 0929 11/30/22 0943           Goals   Current Weight 276 lb (125.2 kg) --      Comment Patient was informed on why it is important to maintain a balanced diet when dealing with Respiratory issues. Explained that it takes a lot of energy to breath and when they are short of breath often they will need to have a good diet to help keep up with the calories they are expending for breathing. Nicholas [name] states that he still is working on drinking plenty of water. He has been drinking a flavored water called Fruit2O. He also has been working on getting more protein as discussed with the RD. He has been eating more peanut butter with fruit as a snack to help with this. He also has not been adding salt at the table to help limit sodium intake.      Expected Outcome Short: Choose and plan snacks accordingly to patients caloric intake to improve breathing. Long: Maintain a diet independently that meets their caloric intake to aid in daily shortness of breath. Short: Continue to get more protein and water. Long: Continue to practice healthy eating patterns discussed with RD.               Nutrition  Goals Discharge (Final Nutrition Goals Re-Evaluation):  Nutrition Goals Re-Evaluation - 11/30/22 0943       Goals   Comment  Nicholas [name] states that he still is working on drinking plenty of water. He has been drinking a flavored water called Fruit2O. He also has been working on getting more protein as discussed with the RD. He has been eating more peanut butter with fruit as a snack to help with this. He also has not been adding salt at the table to help limit sodium intake.    Expected Outcome Short: Continue to get more protein and water. Long: Continue to practice healthy eating patterns discussed with RD.             Psychosocial: Target Goals: Acknowledge presence or absence of significant depression and/or stress, maximize coping skills, provide positive support system. Participant is able to verbalize types and ability to use techniques and skills needed for reducing stress and depression.   Education: Stress, Anxiety, and Depression - Group verbal and visual presentation to define topics covered.  Reviews how body is impacted by stress, anxiety, and depression.  Also discusses healthy ways to reduce stress and to treat/manage anxiety and depression.  Written material given at graduation. Flowsheet Row Pulmonary Rehab from 12/07/2022 in [facility] Cardiac and Pulmonary Rehab  Date 11/02/22  Educator SB  Instruction Review Code 1- [employer] Understanding       Education: Sleep Hygiene -Provides group verbal and written instruction about how sleep can affect your health.  Define sleep hygiene, discuss sleep cycles and impact of sleep habits. Review good sleep hygiene tips.    Initial Review & Psychosocial Screening:  Initial Psych Review & Screening - 10/09/22 1025       Initial Review   Current issues with Current Psychotropic Meds;Current Sleep Concerns      Family Dynamics   Good Support System? Yes    Comments Nicholas [name] has a problem with his son and is not on speaking. He can look  to his wife as a great support system. His sleep has been doing well and takes medication.      Barriers   Psychosocial barriers to participate in program The patient should benefit from training in stress management and relaxation.      Screening Interventions   Interventions Encouraged to exercise;To provide support and resources with identified psychosocial needs;Provide feedback about the scores to participant    Expected Outcomes Short Term goal: Utilizing psychosocial counselor, staff and physician to assist with identification of specific Stressors or current issues interfering with healing process. Setting desired goal for each stressor or current issue identified.;Long Term Goal: Stressors or current issues are controlled or eliminated.;Short Term goal: Identification and review with participant of any Quality of Life or Depression concerns found by scoring the questionnaire.;Long Term goal: The participant improves quality of Life and PHQ9 Scores as seen by post scores and/or verbalization of changes             Quality of Life Scores:  Scores of 19 and below usually indicate a poorer quality of life in these areas.  A difference of  2-3 points is a clinically meaningful difference.  A difference of 2-3 points in the total score of the Quality of Life Index has been associated with significant improvement in overall quality of life, self-image, physical symptoms, and general health in studies assessing change in quality of life.  PHQ-9: Review Flowsheet  More data exists      12/12/2022 11/30/2022 11/09/2022 10/23/2022 10/18/2022  Depression screen PHQ 2/9  Decreased Interest 0 0 0 1 1  Down, Depressed, Hopeless 0 0 1 1  1  PHQ - 2 Score 0 0 1 2 2   Altered sleeping - 0 0 1 0  Tired, decreased energy - 1 2 3 3   Change in appetite - 0 1 1 1   Feeling bad or failure about yourself  - 0 1 1 2   Trouble concentrating - 0 0 0 0  Moving slowly or fidgety/restless - 0 0 1 0  Suicidal  thoughts - 0 0 0 1  PHQ-9 Score - 1 5 9 9   Difficult doing work/chores - Somewhat difficult Not difficult at all Very difficult Very difficult    Details           Interpretation of Total Score  Total Score Depression Severity:  1-4 = Minimal depression, 5-9 = Mild depression, 10-14 = Moderate depression, 15-19 = Moderately severe depression, 20-27 = Severe depression   Psychosocial Evaluation and Intervention:  Psychosocial Evaluation - 10/09/22 1027       Psychosocial Evaluation & Interventions   Interventions Relaxation education;Encouraged to exercise with the program and follow exercise prescription;Stress management education    Comments Nicholas [name] has a problem with his son and is not on speaking. He can look to his wife as a great support system. His sleep has been doing well and takes medication.    Expected Outcomes Short: Start [facility] to help with mood. Long: Maintain a healthy mental state.    Continue Psychosocial Services  Follow up required by staff             Psychosocial Re-Evaluation:  Psychosocial Re-Evaluation     Row Name 11/09/22 0935 11/30/22 0940           Psychosocial Re-Evaluation   Current issues with Current Stress Concerns Current Stress Concerns      Comments Reviewed patient health questionnaire (PHQ-9) with patient for follow up. Previously, patients score indicated signs/symptoms of depression.  Reviewed to see if patient is improving symptom wise while in program.  Score improved and patient states that it is because he has been able to workout and have a little more energy. Reviewed patient health questionnaire (PHQ-9) with patient for follow up. Previously, patients score indicated signs/symptoms of depression. Reviewed to see if patient is improving symptom wise while in program. Score improved as patient states that reading and exercise have been good stress relievers for him. He has some stress related to family issues with his son, but it  is overall doing well.      Expected Outcomes Short: Continue to attend [facility] regularly for regular exercise and social engagement. Long: Continue to improve symptoms and manage a positive mental state. Short: Continue to attend [facility] regularly for regular exercise and social engagement. Long: Continue to maintain positive outlook.      Interventions Encouraged to attend Pulmonary Rehabilitation for the exercise Encouraged to attend Pulmonary Rehabilitation for the exercise      Continue Psychosocial Services  Follow up required by staff Follow up required by staff        Initial Review   Source of Stress Concerns -- Family               Psychosocial Discharge (Final Psychosocial Re-Evaluation):  Psychosocial Re-Evaluation - 11/30/22 0940       Psychosocial Re-Evaluation   Current issues with Current Stress Concerns    Comments Reviewed patient health questionnaire (PHQ-9) with patient for follow up. Previously, patients score indicated signs/symptoms of depression. Reviewed to see if patient is improving symptom wise while in program.  Score improved as patient states that reading and exercise have been good stress relievers for him. He has some stress related to family issues with his son, but it is overall doing well.    Expected Outcomes Short: Continue to attend [facility] regularly for regular exercise and social engagement. Long: Continue to maintain positive outlook.    Interventions Encouraged to attend Pulmonary Rehabilitation for the exercise    Continue Psychosocial Services  Follow up required by staff      Initial Review   Source of Stress Concerns Family             Education: Education Goals: Education classes will be provided on a weekly basis, covering required topics. Participant will state understanding/return demonstration of topics presented.  Learning Barriers/Preferences:  Learning Barriers/Preferences - 10/09/22 1022       Learning  Barriers/Preferences   Learning Barriers None    Learning Preferences None             General Pulmonary Education Topics:  Infection Prevention: - Provides verbal and written material to individual with discussion of infection control including proper hand washing and proper equipment cleaning during exercise session. Flowsheet Row Pulmonary Rehab from 12/07/2022 in [facility] Cardiac and Pulmonary Rehab  Date 10/18/22  Educator [facility]  Instruction Review Code 1- Verbalizes Understanding       Falls Prevention: - Provides verbal and written material to individual with discussion of falls prevention and safety. Flowsheet Row Pulmonary Rehab from 12/07/2022 in [facility] Cardiac and Pulmonary Rehab  Date 10/18/22  Educator [facility]  Instruction Review Code 1- Verbalizes Understanding       Chronic Lung Disease Review: - Group verbal instruction with posters, models, PowerPoint presentations and videos,  to review new updates, new respiratory medications, new advancements in procedures and treatments. Providing information on websites and "800" numbers for continued self-education. Includes information about supplement oxygen, available portable oxygen systems, continuous and intermittent flow rates, oxygen safety, concentrators, and Medicare reimbursement for oxygen. Explanation of Pulmonary Drugs, including class, frequency, complications, importance of spacers, rinsing mouth after steroid MDI's, and proper cleaning methods for nebulizers. Review of basic lung anatomy and physiology related to function, structure, and complications of lung disease. Review of risk factors. Discussion about methods for diagnosing sleep apnea and types of masks and machines for OSA. Includes a review of the use of types of environmental controls: home humidity, furnaces, filters, dust mite/pet prevention, HEPA vacuums. Discussion about weather changes, air quality and the benefits of nasal washing. Instruction on Warning  signs, infection symptoms, calling MD promptly, preventive modes, and value of vaccinations. Review of effective airway clearance, coughing and/or vibration techniques. Emphasizing that all should Create an Action Plan. Written material given at graduation. Flowsheet Row Pulmonary Rehab from 12/07/2022 in [facility] Cardiac and Pulmonary Rehab  Education need identified 10/18/22  Date 10/19/22  Educator [facility]  Instruction Review Code 1- Verbalizes Understanding       AED/CPR: - Group verbal and written instruction with the use of models to demonstrate the basic use of the AED with the basic ABC's of resuscitation.    Anatomy and Cardiac Procedures: - Group verbal and visual presentation and models provide information about basic cardiac anatomy and function. Reviews the testing methods done to diagnose heart disease and the outcomes of the test results. Describes the treatment choices: Medical Management, Angioplasty, or Coronary Bypass Surgery for treating various heart conditions including Myocardial Infarction, Angina, Valve Disease, and Cardiac Arrhythmias.  Written material given at graduation.  Medication Safety: - Group verbal and visual instruction to review commonly prescribed medications for heart and lung disease. Reviews the medication, class of the drug, and side effects. Includes the steps to properly store meds and maintain the prescription regimen.  Written material given at graduation.   Other: -Provides group and verbal instruction on various topics (see comments)   Knowledge Questionnaire Score:  Knowledge Questionnaire Score - 10/18/22 1252       Knowledge Questionnaire Score   Pre Score 15/18              Core Components/Risk Factors/Patient Goals at Admission:  Personal Goals and Risk Factors at Admission - 10/18/22 1251       Core Components/Risk Factors/Patient Goals on Admission    Weight Management Yes;Weight Loss    Intervention Weight Management:  Develop a combined nutrition and exercise program designed to reach desired caloric intake, while maintaining appropriate intake of nutrient and fiber, sodium and fats, and appropriate energy expenditure required for the weight goal.;Weight Management: Provide education and appropriate resources to help participant work on and attain dietary goals.;Weight Management/Obesity: Establish reasonable short term and long term weight goals.;Obesity: Provide education and appropriate resources to help participant work on and attain dietary goals.    Admit Weight 280 lb 6.4 oz (127.2 kg)    Goal Weight: Short Term 270 lb (122.5 kg)    Goal Weight: Long Term 225 lb (102.1 kg)    Expected Outcomes Short Term: Continue to assess and modify interventions until short term weight is achieved;Long Term: Adherence to nutrition and physical activity/exercise program aimed toward attainment of established weight goal;Weight Loss: Understanding of general recommendations for a balanced deficit meal plan, which promotes 1-2 lb weight loss per week and includes a negative energy balance of [phone] kcal/d;Understanding recommendations for meals to include 15-35% energy as protein, 25-35% energy from fat, 35-60% energy from carbohydrates, less than 200mg  of dietary cholesterol, 20-35 gm of total fiber daily;Understanding of distribution of calorie intake throughout the day with the consumption of 4-5 meals/snacks    Improve shortness of breath with ADL's Yes    Intervention Provide education, individualized exercise plan and daily activity instruction to help decrease symptoms of SOB with activities of daily living.    Expected Outcomes Short Term: Improve cardiorespiratory fitness to achieve a reduction of symptoms when performing ADLs;Long Term: Be able to perform more ADLs without symptoms or delay the onset of symptoms    Diabetes Yes    Intervention Provide education about signs/symptoms and action to take for  hypo/hyperglycemia.;Provide education about proper nutrition, including hydration, and aerobic/resistive exercise prescription along with prescribed medications to achieve blood glucose in normal ranges: Fasting glucose 65-99 mg/dL    Expected Outcomes Short Term: Participant verbalizes understanding of the signs/symptoms and immediate care of hyper/hypoglycemia, proper foot care and importance of medication, aerobic/resistive exercise and nutrition plan for blood glucose control.;Long Term: Attainment of HbA1C < 7%.    Hypertension Yes    Intervention Provide education on lifestyle modifcations including regular physical activity/exercise, weight management, moderate sodium restriction and increased consumption of fresh fruit, vegetables, and low fat dairy, alcohol moderation, and smoking cessation.;Monitor prescription use compliance.    Expected Outcomes Short Term: Continued assessment and intervention until BP is < 140/69mm HG in hypertensive participants. < 130/64mm HG in hypertensive participants with diabetes, heart failure or chronic kidney disease.;Long Term: Maintenance of blood pressure at goal levels.    Lipids Yes    Intervention Provide education and  support for participant on nutrition & aerobic/resistive exercise along with prescribed medications to achieve LDL 70mg , HDL >40mg .    Expected Outcomes Short Term: Participant states understanding of desired cholesterol values and is compliant with medications prescribed. Participant is following exercise prescription and nutrition guidelines.;Long Term: Cholesterol controlled with medications as prescribed, with individualized exercise RX and with personalized nutrition plan. Value goals: LDL < 70mg , HDL > 40 mg.             Education:Diabetes - Individual verbal and written instruction to review signs/symptoms of diabetes, desired ranges of glucose level fasting, after meals and with exercise. Acknowledge that pre and post exercise  glucose checks will be done for 3 sessions at entry of program. Flowsheet Row Pulmonary Rehab from 12/07/2022 in [facility] Cardiac and Pulmonary Rehab  Date 10/18/22  Educator [facility]  Instruction Review Code 1- Verbalizes Understanding       Know Your Numbers and Heart Failure: - Group verbal and visual instruction to discuss disease risk factors for cardiac and pulmonary disease and treatment options.  Reviews associated critical values for Overweight/Obesity, Hypertension, Cholesterol, and Diabetes.  Discusses basics of heart failure: signs/symptoms and treatments.  Introduces Heart Failure Zone chart for action plan for heart failure.  Written material given at graduation. Flowsheet Row Pulmonary Rehab from 12/07/2022 in [facility] Cardiac and Pulmonary Rehab  Date 10/26/22  Educator SB  Instruction Review Code 1- Verbalizes Understanding       Core Components/Risk Factors/Patient Goals Review:   Goals and Risk Factor Review     Row Name 11/09/22 0930 11/30/22 0948           Core Components/Risk Factors/Patient Goals Review   Personal Goals Review Improve shortness of breath with ADL's Improve shortness of breath with ADL's;Diabetes;Weight Management/Obesity      Review Spoke to patient about their shortness of breath and what they can do to improve. Patient has been informed of breathing techniques when starting the program. Patient is informed to tell staff if they have had any med changes and that certain meds they are taking or not taking can be causing shortness of breath. Nicholas [name] states that he would still like to lose some weight. He came in today weighing 276 lbs, and would like to lose down to 250 lb. He has stayed busy with taking care of his wife after her surgery and has not been checking his blood sugar levels. He states that he does have a BS monitor, and will return to checking it routinely once his wife is feeling better. He recently saw his pulmonologist who encouraged him to continue  diet and exercise to help with SOB.      Expected Outcomes Short: Attend [facility] regularly to improve shortness of breath with ADL's. Long: maintain independence with ADL's Short: Continue to work towards weight goal with diet and exercise. Long: Continue to manage lifestyle risk factors.               Core Components/Risk Factors/Patient Goals at Discharge (Final Review):   Goals and Risk Factor Review - 11/30/22 0948       Core Components/Risk Factors/Patient Goals Review   Personal Goals Review Improve shortness of breath with ADL's;Diabetes;Weight Management/Obesity    Review Nicholas [name] states that he would still like to lose some weight. He came in today weighing 276 lbs, and would like to lose down to 250 lb. He has stayed busy with taking care of his wife after her surgery and has not been checking  his blood sugar levels. He states that he does have a BS monitor, and will return to checking it routinely once his wife is feeling better. He recently saw his pulmonologist who encouraged him to continue diet and exercise to help with SOB.    Expected Outcomes Short: Continue to work towards weight goal with diet and exercise. Long: Continue to manage lifestyle risk factors.             ITP Comments:  ITP Comments     Row Name 10/09/22 1024 10/18/22 1233 10/19/22 1007 10/25/22 1233 11/22/22 0848   ITP Comments Virtual Visit completed. Patient informed on EP and RD appointment and 6 Minute walk test. Patient also informed of patient health questionnaires on My Chart. Patient Verbalizes understanding. Visit diagnosis can be found in [facility] 09/18/[facility]22. Completed and gym orientation. Initial ITP created and sent for review to Dr. Jinny Sanders, Medical Director. First full day of exercise!  Patient was oriented to gym and equipment including functions, settings, policies, and procedures.  Patient's individual exercise prescription and treatment plan were reviewed.  All starting workloads  were established based on the results of the 6 minute walk test done at initial orientation visit.  The plan for exercise progression was also introduced and progression will be customized based on patient's performance and goals. 30 Day review completed. Medical Director ITP review done, changes made as directed, and signed approval by Medical Director.    new to program 30 Day review completed. Medical Director ITP review done, changes made as directed, and signed approval by Medical Director.    Row Name 12/13/22 0923           ITP Comments 30 Day review completed. Medical Director ITP review done, changes made as directed, and signed approval by Medical Director.                Comments:

## 2022-12-14 ENCOUNTER — Encounter: Payer: Medicare Other | Admitting: *Deleted

## 2022-12-14 DIAGNOSIS — G894 Chronic pain syndrome: Secondary | ICD-10-CM | POA: Diagnosis not present

## 2022-12-14 DIAGNOSIS — R0609 Other forms of dyspnea: Secondary | ICD-10-CM

## 2022-12-14 NOTE — Progress Notes (Signed)
Daily Session Note  Patient Details  Name: Nicholas [name] MRN: [id number] Date of Birth: [birth year] Referring Provider:   Flowsheet Row Pulmonary Rehab from 10/18/2022 in [facility] Cardiac and Pulmonary Rehab  Referring Provider Dr. Birdena Jubilee       Encounter Date: 12/14/2022  Check In:  Session Check In - 12/14/22 1005       Check-In   Supervising physician immediately available to respond to emergencies See telemetry face sheet for immediately available ER MD    Location [facility]-Cardiac & Pulmonary Rehab    Staff Present Ronette Deter, BS, Exercise Physiologist;Joseph Aviston, RCP,RRT,BSRT;Maxon Delta BS, , Exercise Physiologist;Ashika Apuzzo Katrinka Blazing, RN, ADN    Virtual Visit No    Medication changes reported     No    Fall or balance concerns reported    No    Warm-up and Cool-down Performed on first and last piece of equipment    Resistance Training Performed Yes    VAD Patient? No    PAD/SET Patient? No      Pain Assessment   Currently in Pain? No/denies                Social History   Tobacco Use  Smoking Status Never  Smokeless Tobacco Never    Goals Met:  Independence with exercise equipment Exercise tolerated well No report of concerns or symptoms today Strength training completed today  Goals Unmet:  Not Applicable  Comments: Pt able to follow exercise prescription today without complaint.  Will continue to monitor for progression.    Dr. Bethann Punches is Medical Director for [facility] Cardiac Rehabilitation.  Dr. Vida Rigger is Medical Director for [facility] Pulmonary Rehabilitation.

## 2022-12-14 NOTE — Addendum Note (Signed)
Addended by: Concepcion Elk on: 12/14/2022 03:43 PM   Modules accepted: Orders

## 2022-12-19 ENCOUNTER — Encounter: Payer: Medicare Other | Admitting: *Deleted

## 2022-12-19 DIAGNOSIS — G894 Chronic pain syndrome: Secondary | ICD-10-CM | POA: Diagnosis not present

## 2022-12-19 DIAGNOSIS — R0609 Other forms of dyspnea: Secondary | ICD-10-CM

## 2022-12-19 LAB — TOXASSURE SELECT 13 (MW), URINE

## 2022-12-19 NOTE — Progress Notes (Signed)
Daily Session Note  Patient Details  Name: Nicholas [name] MRN: [id number] Date of Birth: [birth year] Referring Provider:   Flowsheet Row Pulmonary Rehab from 10/18/2022 in [facility] Cardiac and Pulmonary Rehab  Referring Provider Dr. Birdena Jubilee       Encounter Date: 12/19/2022  Check In:  Session Check In - 12/19/22 0936       Check-In   Supervising physician immediately available to respond to emergencies See telemetry face sheet for immediately available ER MD    Location [facility]-Cardiac & Pulmonary Rehab    Staff Present Cora Collum, RN, BSN, CCRP;Margaret Best, MS, Exercise Physiologist;Noah Tickle, BS, Exercise Physiologist;Maxon Conetta BS, , Exercise Physiologist;Meredith Jewel Baize, RN BSN    Virtual Visit No    Medication changes reported     No    Fall or balance concerns reported    No    Warm-up and Cool-down Performed on first and last piece of equipment    Resistance Training Performed Yes    VAD Patient? No    PAD/SET Patient? No      Pain Assessment   Currently in Pain? No/denies                Social History   Tobacco Use  Smoking Status Never  Smokeless Tobacco Never    Goals Met:  Proper associated with RPD/PD & O2 Sat Independence with exercise equipment Exercise tolerated well No report of concerns or symptoms today  Goals Unmet:  Not Applicable  Comments: Pt able to follow exercise prescription today without complaint.  Will continue to monitor for progression.    Dr. Bethann Punches is Medical Director for [facility] Cardiac Rehabilitation.  Dr. Vida Rigger is Medical Director for [facility] Pulmonary Rehabilitation.

## 2022-12-20 ENCOUNTER — Ambulatory Visit (INDEPENDENT_AMBULATORY_CARE_PROVIDER_SITE_OTHER): Payer: Medicare Other | Admitting: Family Medicine

## 2022-12-20 ENCOUNTER — Encounter: Payer: Self-pay | Admitting: Family Medicine

## 2022-12-20 ENCOUNTER — Encounter: Payer: Medicare Other | Admitting: *Deleted

## 2022-12-20 ENCOUNTER — Ambulatory Visit: Payer: Self-pay

## 2022-12-20 VITALS — BP 115/70 | HR 71 | Temp 98.7°F | Resp 16 | Wt 275.6 lb

## 2022-12-20 DIAGNOSIS — N390 Urinary tract infection, site not specified: Secondary | ICD-10-CM | POA: Diagnosis not present

## 2022-12-20 DIAGNOSIS — R0609 Other forms of dyspnea: Secondary | ICD-10-CM

## 2022-12-20 LAB — URINALYSIS, ROUTINE W REFLEX MICROSCOPIC
Bilirubin, UA: NEGATIVE
Glucose, UA: NEGATIVE
Ketones, UA: NEGATIVE
Nitrite, UA: NEGATIVE
Specific Gravity, UA: >1.03 — ABNORMAL HIGH (ref 1.005–1.030)
Urobilinogen, Ur: 1 mg/dL (ref 0.2–1.0)
pH, UA: 5.5 (ref 5.0–7.5)

## 2022-12-20 LAB — MICROSCOPIC EXAMINATION: WBC, UA: >30 /[HPF] — AB (ref 0–5)

## 2022-12-20 MED ORDER — SULFAMETHOXAZOLE-TRIMETHOPRIM 800-160 MG PO TABS: 1.0000 | ORAL_TABLET | Freq: Two times a day (BID) | ORAL | 0 refills | Status: DC

## 2022-12-20 NOTE — Progress Notes (Signed)
BP 115/70 (BP Location: Left Arm, Patient Position: Sitting, Cuff Size: Large)   Pulse 71   Temp 98.7 F (37.1 C) (Oral)   Resp 16   Wt 275 lb 9.6 oz (125 kg)   SpO2 95%   BMI 39.54 kg/m    Subjective:    Patient ID: Nicholas [name], male    DOB: 1945/10/29, 77 y.o.   MRN: [id number]  HPI: Nicholas [name] is a 77 y.o. male  Chief Complaint  Patient presents with   Urinary Tract Infection    Started 4-5 days ago but much worse this morning. Urgency, frequency and pain/burning. No blood in urine.    URINARY SYMPTOMS Duration: 4-5 days, worse today Dysuria: burning Urinary frequency: yes Urgency: yes Small volume voids: yes Symptom severity: moderate to severe Urinary incontinence: yes Foul odor: no Hematuria: no Abdominal pain: no Back pain: no Suprapubic pain/pressure: yes Flank pain: no Fever:  no Vomiting: no Relief with cranberry juice: no Relief with pyridium: no Status: worse Previous urinary tract infection: yes Recurrent urinary tract infection: yes History of sexually transmitted disease: no Penile discharge: no Treatments attempted: increasing fluids   Relevant past medical, surgical, family and social history reviewed and updated as indicated. Interim medical history since our last visit reviewed. Allergies and medications reviewed and updated.  Review of Systems  Constitutional: Negative.   Respiratory: Negative.    Cardiovascular: Negative.   Gastrointestinal: Negative.   Genitourinary:  Positive for dysuria, frequency and urgency. Negative for decreased urine volume, difficulty urinating, enuresis, flank pain, genital sores, hematuria, penile discharge, penile pain, penile swelling, scrotal swelling and testicular pain.  Musculoskeletal: Negative.   Psychiatric/Behavioral: Negative.      Per HPI unless specifically indicated above     Objective:    BP 115/70 (BP Location: Left Arm, Patient Position: Sitting, Cuff Size: Large)   Pulse 71    Temp 98.7 F (37.1 C) (Oral)   Resp 16   Wt 275 lb 9.6 oz (125 kg)   SpO2 95%   BMI 39.54 kg/m   Wt Readings from Last 3 Encounters:  12/20/22 275 lb 9.6 oz (125 kg)  12/12/22 273 lb (123.8 kg)  11/22/22 278 lb 12.8 oz (126.5 kg)    Physical Exam Vitals and nursing note reviewed.  Constitutional:      General: He is not in acute distress.    Appearance: Normal appearance. He is obese. He is not ill-appearing, toxic-appearing or diaphoretic.  HENT:     Head: Normocephalic and atraumatic.     Right Ear: External ear normal.     Left Ear: External ear normal.     Nose: Nose normal.     Mouth/Throat:     Mouth: Mucous membranes are moist.     Pharynx: Oropharynx is clear.  Eyes:     General: No scleral icterus.       Right eye: No discharge.        Left eye: No discharge.     Extraocular Movements: Extraocular movements intact.     Conjunctiva/sclera: Conjunctivae normal.     Pupils: Pupils are equal, round, and reactive to light.  Cardiovascular:     Rate and Rhythm: Normal rate and regular rhythm.     Pulses: Normal pulses.     Heart sounds: Normal heart sounds. No murmur heard.    No friction rub. No gallop.  Pulmonary:     Effort: Pulmonary effort is normal. No respiratory distress.  Breath sounds: Normal breath sounds. No stridor. No wheezing, rhonchi or rales.  Chest:     Chest wall: No tenderness.  Abdominal:     General: Abdomen is flat. Bowel sounds are normal. There is no distension.     Palpations: Abdomen is soft. There is no mass.     Tenderness: There is no abdominal tenderness. There is no right CVA tenderness, left CVA tenderness, guarding or rebound.     Hernia: No hernia is present.  Musculoskeletal:        General: Normal range of motion.     Cervical back: Normal range of motion and neck supple.  Skin:    General: Skin is warm and dry.     Capillary Refill: Capillary refill takes less than 2 seconds.     Coloration: Skin is not jaundiced or  pale.     Findings: No bruising, erythema, lesion or rash.  Neurological:     General: No focal deficit present.     Mental Status: He is alert and oriented to person, place, and time. Mental status is at baseline.  Psychiatric:        Mood and Affect: Mood normal.        Behavior: Behavior normal.        Thought Content: Thought content normal.        Judgment: Judgment normal.     Results for orders placed or performed in visit on 12/12/22  ToxASSURE Select 13 (MW), Urine  Result Value Ref Range   Summary FINAL       Assessment & Plan:   Problem List Items Addressed This Visit   None Visit Diagnoses     Recurrent UTI    -  Primary   Will treat with bactrim and return in about 10 days for repeat UA. Call if not getting better or getting worse.   Relevant Medications   sulfamethoxazole-trimethoprim (BACTRIM DS) 800-160 MG tablet   Other Relevant Orders   Urinalysis, Routine w reflex microscopic   Urine Culture   EKG 12-Lead   Urinalysis, Routine w reflex microscopic   Urine Culture        Follow up plan: Return for As scheduled.

## 2022-12-20 NOTE — Progress Notes (Signed)
Daily Session Note  Patient Details  Name: Nicholas [name] MRN: [id number] Date of Birth: 02-27-1945 Referring Provider:   Flowsheet Row Pulmonary Rehab from 10/18/2022 in [facility] Cardiac and Pulmonary Rehab  Referring Provider Dr. Birdena Jubilee       Encounter Date: 12/20/2022  Check In:  Session Check In - 12/20/22 0913       Check-In   Supervising physician immediately available to respond to emergencies See telemetry face sheet for immediately available ER MD    Location [facility]-Cardiac & Pulmonary Rehab    Staff Present Rory Percy, MS, Exercise Physiologist;Maxon Conetta BS, , Exercise Physiologist;Montrey Buist Katrinka Blazing, RN, ADN    Virtual Visit No    Medication changes reported     No    Fall or balance concerns reported    No    Warm-up and Cool-down Performed on first and last piece of equipment    Resistance Training Performed Yes    VAD Patient? No    PAD/SET Patient? No      Pain Assessment   Currently in Pain? No/denies                Social History   Tobacco Use  Smoking Status Never  Smokeless Tobacco Never    Goals Met:  Independence with exercise equipment Exercise tolerated well No report of concerns or symptoms today Strength training completed today  Goals Unmet:  Not Applicable  Comments: Pt able to follow exercise prescription today without complaint.  Will continue to monitor for progression.    Dr. Bethann Punches is Medical Director for [facility] Cardiac Rehabilitation.  Dr. Vida Rigger is Medical Director for [facility] Pulmonary Rehabilitation.

## 2022-12-20 NOTE — Telephone Encounter (Signed)
  Chief Complaint: UTI Symptoms: dysuria, pain, trouble urinating Frequency: 4-5 days Pertinent Negatives: NA Disposition: [] ED /[] Urgent Care (no appt availability in office) / [x] Appointment(In office/virtual)/ []  [facility]/ [] Home Care/ [] Refused Recommended Disposition /[] [facility]/ []  Follow-up with PCP Additional Notes: scheduled OV today at 1120 with Dr. Laural Benes.   Summary: urinary discomfort / rx req   The patient has experienced urinary discomfort for roughly 4 days  The patient shares that they have a history of bladder infections and would like to have a prescription to help with their urinary discomfort  Please contact the patient further when possible     Reason for Disposition  Urination is difficult to start (i.e., hesitancy) or straining  Answer Assessment - Initial Assessment Questions 1. SYMPTOM: "What's the main symptom you're concerned about?" (e.g., frequency, incontinence)     dysuria 2. ONSET: "When did the  sx  start?"     4-5 days 3. PAIN: "Is there any pain?" If Yes, ask: "How bad is it?" (Scale: 1-10; mild, moderate, severe)     yes 4. CAUSE: "What do you think is causing the symptoms?"     Possible UTI 5. OTHER SYMPTOMS: "Do you have any other symptoms?" (e.g., blood in urine, fever, flank pain, pain with urination)    Trouble urinating  Protocols used: Urinary Symptoms-A-AH

## 2022-12-23 LAB — URINE CULTURE

## 2022-12-26 ENCOUNTER — Encounter: Payer: Medicare Other | Attending: Internal Medicine | Admitting: *Deleted

## 2022-12-26 DIAGNOSIS — R0609 Other forms of dyspnea: Secondary | ICD-10-CM | POA: Diagnosis present

## 2022-12-26 NOTE — Progress Notes (Signed)
Daily Session Note  Patient Details  Name: Nicholas [name] MRN: [id number] Date of Birth: Sep 11, 1945 Referring Provider:   Flowsheet Row Pulmonary Rehab from 10/18/2022 in [facility] Cardiac and Pulmonary Rehab  Referring Provider Dr. Birdena Jubilee       Encounter Date: 12/26/2022  Check In:  Session Check In - 12/26/22 0930       Check-In   Supervising physician immediately available to respond to emergencies See telemetry face sheet for immediately available ER MD    Location [facility]-Cardiac & Pulmonary Rehab    Staff Present Cora Collum, RN, BSN, CCRP;Noah Tickle, BS, Exercise Physiologist;Margaret Best, MS, Exercise Physiologist;Maxon Conetta BS, , Exercise Physiologist    Virtual Visit No    Medication changes reported     No    Fall or balance concerns reported    No    Warm-up and Cool-down Performed on first and last piece of equipment    Resistance Training Performed Yes    VAD Patient? No    PAD/SET Patient? No      Pain Assessment   Currently in Pain? No/denies                Social History   Tobacco Use  Smoking Status Never  Smokeless Tobacco Never    Goals Met:  Proper associated with RPD/PD & O2 Sat Independence with exercise equipment Exercise tolerated well No report of concerns or symptoms today  Goals Unmet:  Not Applicable  Comments: Pt able to follow exercise prescription today without complaint.  Will continue to monitor for progression.    Dr. Bethann Punches is Medical Director for [facility] Cardiac Rehabilitation.  Dr. Vida Rigger is Medical Director for [facility] Pulmonary Rehabilitation.

## 2022-12-27 ENCOUNTER — Other Ambulatory Visit: Payer: Self-pay | Admitting: Nurse Practitioner

## 2022-12-28 ENCOUNTER — Encounter: Payer: Medicare Other | Admitting: *Deleted

## 2022-12-28 DIAGNOSIS — R0609 Other forms of dyspnea: Secondary | ICD-10-CM

## 2022-12-28 NOTE — Progress Notes (Signed)
Daily Session Note  Patient Details  Name: Nicholas [name] MRN: [id number] Date of Birth: Nov 21, 1945 Referring Provider:   Flowsheet Row Pulmonary Rehab from 10/18/2022 in [facility] Cardiac and Pulmonary Rehab  Referring Provider Dr. Birdena Jubilee       Encounter Date: 12/28/2022  Check In:  Session Check In - 12/28/22 0958       Check-In   Supervising physician immediately available to respond to emergencies See telemetry face sheet for immediately available ER MD    Location [facility]-Cardiac & Pulmonary Rehab    Staff Present Cora Collum, RN, BSN, CCRP;Margaret Best, MS, Exercise Physiologist;Joseph Reino Kent, RCP,RRT,BSRT;Noah Tickle, BS, Exercise Physiologist    Virtual Visit No    Medication changes reported     No    Fall or balance concerns reported    No    Warm-up and Cool-down Performed on first and last piece of equipment    Resistance Training Performed Yes    VAD Patient? No    PAD/SET Patient? No      Pain Assessment   Currently in Pain? No/denies                Social History   Tobacco Use  Smoking Status Never  Smokeless Tobacco Never    Goals Met:  Proper associated with RPD/PD & O2 Sat Independence with exercise equipment Exercise tolerated well No report of concerns or symptoms today  Goals Unmet:  Not Applicable  Comments: Pt able to follow exercise prescription today without complaint.  Will continue to monitor for progression.    Dr. Bethann Punches is Medical Director for [facility] Cardiac Rehabilitation.  Dr. Vida Rigger is Medical Director for [facility] Pulmonary Rehabilitation.

## 2022-12-29 LAB — URINALYSIS, ROUTINE W REFLEX MICROSCOPIC
Bilirubin, UA: NEGATIVE
Glucose, UA: NEGATIVE
Ketones, UA: NEGATIVE
Leukocytes,UA: NEGATIVE
Nitrite, UA: NEGATIVE
Protein,UA: NEGATIVE
RBC, UA: NEGATIVE
Specific Gravity, UA: 1.02 (ref 1.005–1.030)
Urobilinogen, Ur: 0.2 mg/dL (ref 0.2–1.0)
pH, UA: 5.5 (ref 5.0–7.5)

## 2022-12-29 NOTE — Telephone Encounter (Signed)
Requested Prescriptions  Pending Prescriptions Disp Refills   albuterol (VENTOLIN HFA) 108 (90 Base) MCG/ACT inhaler [Pharmacy Med Name: ALBUTEROL HFA (PROVENTIL) INH] 6.7 each 2    Sig: INHALE 2 PUFFS BY MOUTH EVERY 6 HOURS AS NEEDED     Pulmonology:  Beta Agonists 2 Passed - 12/27/2022  2:17 PM      Passed - Last BP in normal range    BP Readings from Last 1 Encounters:  12/20/22 115/70         Passed - Last Heart Rate in normal range    Pulse Readings from Last 1 Encounters:  12/20/22 71         Passed - Valid encounter within last 12 months    Recent Outpatient Visits           1 week ago Recurrent UTI   [facility] [place], Megan P, DO   1 month ago Acute cystitis without hematuria   [place] [facility] [place], Megan P, DO   2 months ago Dysuria   [place] [facility] [place], Sherran Needs, NP   2 months ago Type 2 diabetes mellitus with obesity (HCC)   [place] [facility] [place], [name], NP   5 months ago Tinea cruris   [place] [facility] [place], [name], NP       Future Appointments             In 3 days Marjie Skiff, NP [facility]   In 2 months Vanna Scotland, MD [facility]

## 2022-12-30 NOTE — Patient Instructions (Signed)
Be Involved in Caring For Your Health:  Taking Medications When medications are taken as directed, they can greatly improve your health. But if they are not taken as prescribed, they may not work. In some cases, not taking them correctly can be harmful. To help ensure your treatment remains effective and safe, understand your medications and how to take them. Bring your medications to each visit for review by your provider.  Your lab results, notes, and after visit summary will be available on My Chart. We strongly encourage you to use this feature. If lab results are abnormal the clinic will contact you with the appropriate steps. If the clinic does not contact you assume the results are satisfactory. You can always view your results on My Chart. If you have questions regarding your health or results, please contact the clinic during office hours. You can also ask questions on My Chart.  We at [facility] are grateful that you chose [place] to provide your care. We strive to provide evidence-based and compassionate care and are always looking for feedback. If you get a survey from the clinic please complete this so we can hear your opinions.  Diabetes Mellitus and Foot Care Diabetes, also called diabetes mellitus, may cause problems with your feet and legs because of poor blood flow (circulation). Poor circulation may make your skin: Become thinner and drier. Break more easily. Heal more slowly. Peel and crack. You may also have nerve damage (neuropathy). This can cause decreased feeling in your legs and feet. This means that you may not notice minor injuries to your feet that could lead to more serious problems. Finding and treating problems early is the best way to prevent future foot problems. How to care for your feet Foot hygiene  Wash your feet daily with warm water and mild soap. Do not use hot water. Then, pat your feet and the areas between your toes until they are fully dry. Do  not soak your feet. This can dry your skin. Trim your toenails straight across. Do not dig under them or around the cuticle. File the edges of your nails with an emery board or nail file. Apply a moisturizing lotion or petroleum jelly to the skin on your feet and to dry, brittle toenails. Use lotion that does not contain alcohol and is unscented. Do not apply lotion between your toes. Shoes and socks Wear clean socks or stockings every day. Make sure they are not too tight. Do not wear knee-high stockings. These may decrease blood flow to your legs. Wear shoes that fit well and have enough cushioning. Always look in your shoes before you put them on to be sure there are no objects inside. To break in new shoes, wear them for just a few hours a day. This prevents injuries on your feet. Wounds, scrapes, corns, and calluses  Check your feet daily for blisters, cuts, bruises, sores, and redness. If you cannot see the bottom of your feet, use a mirror or ask someone for help. Do not cut off corns or calluses or try to remove them with medicine. If you find a minor scrape, cut, or break in the skin on your feet, keep it and the skin around it clean and dry. You may clean these areas with mild soap and water. Do not clean the area with peroxide, alcohol, or iodine. If you have a wound, scrape, corn, or callus on your foot, look at it several times a day to make sure it  is healing and not infected. Check for: Redness, swelling, or pain. Fluid or blood. Warmth. Pus or a bad smell. General tips Do not cross your legs. This may decrease blood flow to your feet. Do not use heating pads or hot water bottles on your feet. They may burn your skin. If you have lost feeling in your feet or legs, you may not know this is happening until it is too late. Protect your feet from hot and cold by wearing shoes, such as at the beach or on hot pavement. Schedule a complete foot exam at least once a year or more often if  you have foot problems. Report any cuts, sores, or bruises to your health care provider right away. Where to find more information American Diabetes Association: diabetes.org Association of Diabetes Care & Education Specialists: diabeteseducator.org Contact a health care provider if: You have a condition that increases your risk of infection, and you have any cuts, sores, or bruises on your feet. You have an injury that is not healing. You have redness on your legs or feet. You feel burning or tingling in your legs or feet. You have pain or cramps in your legs and feet. Your legs or feet are numb. Your feet always feel cold. You have pain around any toenails. Get help right away if: You have a wound, scrape, corn, or callus on your foot and: You have signs of infection. You have a fever. You have a red line going up your leg. This information is not intended to replace advice given to you by your health care provider. Make sure you discuss any questions you have with your health care provider. Document Revised: 07/13/2021 Document Reviewed: 07/13/2021 Elsevier Patient Education  2024 [employer].

## 2023-01-01 ENCOUNTER — Ambulatory Visit (INDEPENDENT_AMBULATORY_CARE_PROVIDER_SITE_OTHER): Payer: Medicare Other | Admitting: Nurse Practitioner

## 2023-01-01 ENCOUNTER — Encounter: Payer: Self-pay | Admitting: Nurse Practitioner

## 2023-01-01 VITALS — BP 103/63 | HR 80 | Temp 98.0°F | Ht 70.0 in | Wt 274.2 lb

## 2023-01-01 DIAGNOSIS — N1831 Chronic kidney disease, stage 3a: Secondary | ICD-10-CM

## 2023-01-01 DIAGNOSIS — I442 Atrioventricular block, complete: Secondary | ICD-10-CM | POA: Diagnosis not present

## 2023-01-01 DIAGNOSIS — E1169 Type 2 diabetes mellitus with other specified complication: Secondary | ICD-10-CM | POA: Diagnosis not present

## 2023-01-01 DIAGNOSIS — I4819 Other persistent atrial fibrillation: Secondary | ICD-10-CM | POA: Diagnosis not present

## 2023-01-01 DIAGNOSIS — F419 Anxiety disorder, unspecified: Secondary | ICD-10-CM

## 2023-01-01 DIAGNOSIS — E1159 Type 2 diabetes mellitus with other circulatory complications: Secondary | ICD-10-CM

## 2023-01-01 DIAGNOSIS — I951 Orthostatic hypotension: Secondary | ICD-10-CM

## 2023-01-01 DIAGNOSIS — Z79899 Other long term (current) drug therapy: Secondary | ICD-10-CM

## 2023-01-01 DIAGNOSIS — I152 Hypertension secondary to endocrine disorders: Secondary | ICD-10-CM

## 2023-01-01 DIAGNOSIS — G894 Chronic pain syndrome: Secondary | ICD-10-CM

## 2023-01-01 DIAGNOSIS — F339 Major depressive disorder, recurrent, unspecified: Secondary | ICD-10-CM

## 2023-01-01 DIAGNOSIS — F1190 Opioid use, unspecified, uncomplicated: Secondary | ICD-10-CM

## 2023-01-01 LAB — BAYER DCA HB A1C WAIVED: HB A1C (BAYER DCA - WAIVED): 6.3 % — ABNORMAL HIGH (ref 4.8–5.6)

## 2023-01-01 LAB — URINE CULTURE

## 2023-01-01 MED ORDER — LORAZEPAM 1 MG PO TABS: ORAL_TABLET | ORAL | 0 refills | Status: DC

## 2023-01-01 NOTE — Assessment & Plan Note (Signed)
Chronic, ongoing.  Discussed at length risk of benzo and opioid use in conjunction with each other.  Recommend he not take the two together at same hour during day or evening. Recommend now that has CPAP and improved sleep trial cut back slowly on Ativan.  They request 90 day supply, as previous PCP supplied this.  Are aware he does have to return every 3 months for refills.  UDS up to date with pain management.  Refills up to date.

## 2023-01-01 NOTE — Assessment & Plan Note (Signed)
BMI 39.34 with HTN, A-Fib, Heart Block, and CKD.  Recommended eating smaller high protein, low fat meals more frequently and exercising 30 mins a day 5 times a week with a goal of 10-15lb weight loss in the next 3 months. Patient voiced their understanding and motivation to adhere to these recommendations.

## 2023-01-01 NOTE — Assessment & Plan Note (Signed)
Chronic, ongoing.  Followed by cardiology.  Will continue this collaboration and medication as ordered by them.

## 2023-01-01 NOTE — Assessment & Plan Note (Signed)
Labs have been stable for some time with Jardiance on board.  His recent labs since July 2022 have been within good ranges and improved.  If ongoing stability will discontinue this diagnosis.

## 2023-01-01 NOTE — Assessment & Plan Note (Signed)
Chronic, ongoing followed by pain clinic, Dr. Andree Elk.  Discussed at length risk of benzo and opioid use in conjunction with each other.  Recommend he not take the two together at same hour during day or evening.

## 2023-01-01 NOTE — Assessment & Plan Note (Signed)
Ongoing and stable.  Followed by cardiology.  Pacemaker checks by them.

## 2023-01-01 NOTE — Assessment & Plan Note (Signed)
Diagnosed on 02/21/21, currently taking Jardiance with tolerance.  A1c 6.3% today, trend down.  Glucometer supplies present, recommend he check sugars at least daily fasting.  Labs today.  Recommend cutting back on snacking during day and minimize sweets.  Return in 3 months. - Statin on board.  No ACE or ARB due to hypotension episodes. - Vaccinations up to date. - Eye and foot exams up to date.

## 2023-01-01 NOTE — Assessment & Plan Note (Signed)
Chronic, stable.  BP well below goal today.  Followed by cardiology.  With orthostatic BP presenting occasionally.  Continue current regimen at this time and have recommended utilizing compression hose at home, agrees to try this.  Continue collaboration with cardiology + neurology and current medication regimen.  Labs: CMP.  Recommend ensuring good fluid intake at home.

## 2023-01-01 NOTE — Progress Notes (Signed)
BP 103/63   Pulse 80   Temp 98 F (36.7 C) (Oral)   Ht 5\' 10"  (1.778 m)   Wt 274 lb 3.2 oz (124.4 kg)   SpO2 97%   BMI 39.34 kg/m    Subjective:    Patient ID: [name], male    DOB: 1945/06/06, 77 y.o.   MRN: [id number]  HPI: Nicholas [name] is a 77 y.o. male  Chief Complaint  Patient presents with   Diabetes   Hyperlipidemia   Hypertension   DIABETES A1c September was 7%.  Taking Jardiance 25 MG daily for heart and diabetes.  Ozempic caused GI symptoms, including increased heart burn.   Hypoglycemic episodes:no Polydipsia/polyuria: no Visual disturbance: no Chest pain: no Paresthesias: no Glucose Monitoring: yes  Accucheck frequency: not checking  Fasting glucose:   Post prandial:  Evening:  Before meals: Taking Insulin?: no  Long acting insulin:  Short acting insulin: Blood Pressure Monitoring: daily Retinal Examination: Up To Date -- Dr. Leonides Cave daughter, macular degeneration noted Foot Exam: Up to Date Pneumovax: Up to Date Influenza: Up to Date Aspirin: no   HYPERTENSION / HYPERLIPIDEMIA/A-FIB Last cardiology visit on 11/14/22 with no changes, they did increase pacemaker settings months back and on recent assessment battery was getting low.  Taking Rosuvastatin, Eliquis, fish oil.  His last ablation was 05/17/20.  Echo 04/06/22 EF 60-65% and mild LVH.  Pacemaker check 09/11/22 -- was placed January 2018. CPAP 90% of the time and finds benefit with sleep pattern.  He continues on Midodrine for hypotensive episodes.  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  Last saw 12/18/22 -- he is currently doing heart track and finding benefit from this program. Satisfied with current treatment? yes Duration of hypertension: chronic BP monitoring frequency: occasional BP range:  BP medication side effects: no Duration of hyperlipidemia: chronic Cholesterol medication side effects: no Cholesterol supplements: fish oil Medication  compliance: good compliance Aspirin: no Recent stressors: no Recurrent headaches: no Visual changes: no Palpitations: no Dyspnea: if exerting self a lot, even with showering -- ongoing issue Chest pain: no Lower extremity edema: no Dizzy/lightheaded: occasional  CHRONIC KIDNEY DISEASE Recent labs have showed no kidney disease. CKD status: stable Medications renally dose: yes Previous renal evaluation: no Pneumovax:  Up to Date Influenza Vaccine:  Up to Date    Latest Ref Rng & Units 10/02/2022    9:10 AM 05/30/2022    8:33 AM 04/28/2022   12:45 PM  BMP  Glucose 70 - 99 mg/dL 409  811  914   BUN 8 - 27 mg/dL 17  15  29    Creatinine 0.76 - 1.27 mg/dL 7.82  9.56  2.13   BUN/Creat Ratio 10 - 24 15  14     Sodium 134 - 144 mmol/L 140  139  138   Potassium 3.5 - 5.2 mmol/L 4.1  4.6  3.9   Chloride 96 - 106 mmol/L 101  101  95   CO2 20 - 29 mmol/L 24  21  31    Calcium 8.6 - 10.2 mg/dL 9.3  9.5  9.0     DEPRESSION & CHRONIC PAIN Taking Effexor, Seroquel, and Ativan.  Pt and his wife at bedside made aware of risks of benzo medication use to include increased sedation, respiratory suppression, falls, extrapyramidal movements, dependence and cardiovascular events.  Pt and his wife would like to continue treatment as benefit determined to outweigh risk.  Multiple at length discussions with  him that he is also on opioid therapy.  Discussed risks with taking these three medications together at same time and recommend to separate them when taking Seroquel, Ativan, and opioid.  Is a Thomasene Ripple, has been on this regimen for years.   Tried in past taking 1/2 tablet Ativan, but does not work well (per wife and patient) and wishes to maintain current dosing.  Last Ativan fill on PDMP review 10/19/22 and last opioid fill 12/19/22.  Last UDS with pain management on 12/14/22. Saw Dr. Pernell Dupre with pain management last 12/12/22. Duration:stable Anxious mood: yes, occasional Excessive worrying: yes Irritability:  no Sweating: no Nausea: no Palpitations:no Hyperventilation: no Panic attacks: no Agoraphobia: no  Obscessions/compulsions: no Depressed mood: sometimes    12/12/2022    1:00 PM 11/30/2022    9:39 AM 11/09/2022    9:31 AM 10/23/2022    9:11 AM 10/18/2022   12:53 PM  Depression screen PHQ 2/9  Decreased Interest 0 0 0 1 1  Down, Depressed, Hopeless 0 0 1 1 1   PHQ - 2 Score 0 0 1 2 2   Altered sleeping  0 0 1 0  Tired, decreased energy  1 2 3 3   Change in appetite  0 1 1 1   Feeling bad or failure about yourself   0 1 1 2   Trouble concentrating  0 0 0 0  Moving slowly or fidgety/restless  0 0 1 0  Suicidal thoughts  0 0 0 1  PHQ-9 Score  1 5 9 9   Difficult doing work/chores  Somewhat difficult Not difficult at all Very difficult Very difficult       10/23/2022    9:11 AM 10/02/2022    9:09 AM 11/29/2021    8:53 AM 08/24/2021    8:38 AM  GAD 7 : Generalized Anxiety Score  Nervous, Anxious, on Edge 1 0 1 3  Control/stop worrying 2 3 2 3   Worry too much - different things 2 3 1 3   Trouble relaxing 1 0 1 3  Restless 0 0 1 3  Easily annoyed or irritable 1 0 1 3  Afraid - awful might happen 2 2 1 3   Total GAD 7 Score 9 8 8 21   Anxiety Difficulty Somewhat difficult Somewhat difficult Not difficult at all Extremely difficult   AIMS:  Facial and Oral Movements  Muscles of Facial Expression: None Lips and Perioral Area: None Jaw: None Tongue: None Extremity Movements: Upper (arms, wrists, hands, fingers): None Lower (legs, knees, ankles, toes): None Trunk Movements:  Neck, shoulders, hips: None Overall Severity : Severity of abnormal movements: None Incapacitation due to abnormal movements: None Patient's awareness of abnormal movements (rate only patient's report): none present Dental Status  Current problems with teeth and/or dentures?: No  Does patient usually wear dentures?: No   Relevant past medical, surgical, family and social history reviewed and updated as  indicated. Interim medical history since our last visit reviewed. Allergies and medications reviewed and updated.  Review of Systems  Constitutional:  Negative for activity change, appetite change, fatigue and fever.  Respiratory:  Positive for shortness of breath (occasional). Negative for cough, chest tightness and wheezing.   Cardiovascular:  Negative for chest pain, palpitations and leg swelling.  Endocrine: Negative for polydipsia, polyphagia and polyuria.  Musculoskeletal:  Positive for arthralgias.  Neurological: Negative.   Psychiatric/Behavioral:  Negative for decreased concentration, self-injury, sleep disturbance and suicidal ideas. The patient is not nervous/anxious.     Per HPI unless specifically indicated above  Objective:    BP 103/63   Pulse 80   Temp 98 F (36.7 C) (Oral)   Ht 5\' 10"  (1.778 m)   Wt 274 lb 3.2 oz (124.4 kg)   SpO2 97%   BMI 39.34 kg/m   Wt Readings from Last 3 Encounters:  01/01/23 274 lb 3.2 oz (124.4 kg)  12/20/22 275 lb 9.6 oz (125 kg)  12/12/22 273 lb (123.8 kg)    Physical Exam Vitals and nursing note reviewed.  Constitutional:      General: He is awake. He is not in acute distress.    Appearance: Normal appearance. He is well-developed and well-groomed. He is obese. He is not ill-appearing or toxic-appearing.  HENT:     Head: Normocephalic.     Right Ear: Hearing and external ear normal.     Left Ear: Hearing and external ear normal.  Eyes:     General: Lids are normal.     Extraocular Movements: Extraocular movements intact.     Conjunctiva/sclera: Conjunctivae normal.  Neck:     Thyroid: No thyromegaly.     Vascular: No carotid bruit.  Cardiovascular:     Rate and Rhythm: Normal rate and regular rhythm.     Heart sounds: Normal heart sounds. No murmur heard.    No gallop.  Pulmonary:     Effort: No accessory muscle usage or respiratory distress.     Breath sounds: Normal breath sounds.  Abdominal:     General: Bowel  sounds are normal. There is no distension.     Palpations: Abdomen is soft.     Tenderness: There is no abdominal tenderness.  Musculoskeletal:     Cervical back: Full passive range of motion without pain.     Right lower leg: No edema.     Left lower leg: No edema.  Lymphadenopathy:     Cervical: No cervical adenopathy.  Skin:    General: Skin is warm.     Capillary Refill: Capillary refill takes less than 2 seconds.  Neurological:     Mental Status: He is alert and oriented to person, place, and time.     Deep Tendon Reflexes: Reflexes are normal and symmetric.     Reflex Scores:      Brachioradialis reflexes are 2+ on the right side and 2+ on the left side.      Patellar reflexes are 2+ on the right side and 2+ on the left side. Psychiatric:        Attention and Perception: Attention normal.        Mood and Affect: Mood normal.        Speech: Speech normal.        Behavior: Behavior normal. Behavior is cooperative.        Thought Content: Thought content normal.    Results for orders placed or performed in visit on 01/01/23  Bayer DCA Hb A1c Waived  Result Value Ref Range   HB A1C (BAYER DCA - WAIVED) 6.3 (H) 4.8 - 5.6 %      Assessment & Plan:   Problem List Items Addressed This Visit       Cardiovascular and Mediastinum   Chronic orthostatic hypotension   Complete heart block (HCC)    Ongoing and stable.  Followed by cardiology.  Pacemaker checks by them.       Relevant Orders   Comprehensive metabolic panel   Hypertension associated with diabetes (HCC)    Chronic, stable.  BP well below goal today.  Followed by cardiology.  With orthostatic BP presenting occasionally.  Continue current regimen at this time and have recommended utilizing compression hose at home, agrees to try this.  Continue collaboration with cardiology + neurology and current medication regimen.  Labs: CMP.  Recommend ensuring good fluid intake at home.        Relevant Orders   Bayer DCA Hb  A1c Waived (Completed)   Comprehensive metabolic panel   Persistent atrial fibrillation (HCC)    Chronic, ongoing.  Followed by cardiology.  Will continue this collaboration and medication as ordered by them.      Relevant Orders   Comprehensive metabolic panel     Endocrine   Hyperlipidemia associated with type 2 diabetes mellitus (HCC)    Chronic, ongoing.  Continue current medication regimen and adjust as needed. Lipid panel today.      Relevant Orders   Bayer DCA Hb A1c Waived (Completed)   Comprehensive metabolic panel   Lipid Panel w/o Chol/HDL Ratio   Type 2 diabetes mellitus with obesity (HCC) - Primary    Diagnosed on 02/21/21, currently taking Jardiance with tolerance.  A1c 6.3% today, trend down.  Glucometer supplies present, recommend he check sugars at least daily fasting.  Labs today.  Recommend cutting back on snacking during day and minimize sweets.  Return in 3 months. - Statin on board.  No ACE or ARB due to hypotension episodes. - Vaccinations up to date. - Eye and foot exams up to date.      Relevant Orders   Bayer DCA Hb A1c Waived (Completed)     Genitourinary   Stage 3a chronic kidney disease (HCC) (Chronic)    Labs have been stable for some time with Jardiance on board.  His recent labs since July 2022 have been within good ranges and improved.  If ongoing stability will discontinue this diagnosis.      Relevant Orders   Comprehensive metabolic panel     Other   Anxiety (Chronic)    Chronic, ongoing.  Discussed at length risk of benzo and opioid use in conjunction with each other.  Recommend he not take the two together at same hour during day or evening. Recommend now that has CPAP and improved sleep trial cut back slowly on Ativan.  They request 90 day supply, as previous PCP supplied this.  Are aware he does have to return every 3 months for refills.  UDS up to date with pain management.  Refills up to date.      Chronic pain syndrome (Chronic)     Chronic, ongoing followed by pain clinic, Dr. Pernell Dupre.  Discussed at length risk of benzo and opioid use in conjunction with each other.  Recommend he not take the two together at same hour during day or evening.        Depression, recurrent (HCC) (Chronic)    Chronic, ongoing.  Denies SI/HI.  Continue current medication regimen and adjust as needed.  Would benefit from trial off Effexor and trial of SSRI, but refuses this.        Long-term current use of benzodiazepine (Chronic)    Has been on Ativan for many years, with trial of cutting back unsuccessful.  At length discussions about risk of opioid and benzo use had with patient and wife by both PCP and CCM PharmD.  Continue these discussions.  Refer to anxiety plan.      Morbid obesity (HCC) (Chronic)    BMI 39.34 with HTN, A-Fib, Heart Block, and  CKD.  Recommended eating smaller high protein, low fat meals more frequently and exercising 30 mins a day 5 times a week with a goal of 10-15lb weight loss in the next 3 months. Patient voiced their understanding and motivation to adhere to these recommendations.       Chronic, continuous use of opioids    Chronic, ongoing.  Continue collaboration with pain management.        Follow up plan: Return in about 3 months (around 04/01/2023) for T2DM, HTN/HLD, PAIN, MOOD, A-FIB.

## 2023-01-01 NOTE — Assessment & Plan Note (Signed)
Chronic, ongoing.  Denies SI/HI.  Continue current medication regimen and adjust as needed.  Would benefit from trial off Effexor and trial of SSRI, but refuses this.   

## 2023-01-01 NOTE — Assessment & Plan Note (Signed)
Chronic, ongoing.  Continue collaboration with pain management.

## 2023-01-01 NOTE — Assessment & Plan Note (Signed)
Has been on Ativan for many years, with trial of cutting back unsuccessful.  At length discussions about risk of opioid and benzo use had with patient and wife by both PCP and CCM PharmD.  Continue these discussions.  Refer to anxiety plan. 

## 2023-01-01 NOTE — Assessment & Plan Note (Signed)
Chronic, ongoing.  Continue current medication regimen and adjust as needed. Lipid panel today. 

## 2023-01-02 ENCOUNTER — Encounter: Payer: Medicare Other | Admitting: *Deleted

## 2023-01-02 DIAGNOSIS — R0609 Other forms of dyspnea: Secondary | ICD-10-CM | POA: Diagnosis not present

## 2023-01-02 LAB — LIPID PANEL W/O CHOL/HDL RATIO
Cholesterol, Total: 131 mg/dL (ref 100–199)
HDL: 50 mg/dL (ref >39)
LDL Chol Calc (NIH): 63 mg/dL (ref 0–99)
Triglycerides: 95 mg/dL (ref 0–149)
VLDL Cholesterol Cal: 18 mg/dL (ref 5–40)

## 2023-01-02 LAB — COMPREHENSIVE METABOLIC PANEL
ALT: 25 [IU]/L (ref 0–44)
AST: 33 [IU]/L (ref 0–40)
Albumin: 4.6 g/dL (ref 3.8–4.8)
Alkaline Phosphatase: 63 [IU]/L (ref 44–121)
BUN/Creatinine Ratio: 13 (ref 10–24)
BUN: 18 mg/dL (ref 8–27)
Bilirubin Total: 0.5 mg/dL (ref 0.0–1.2)
CO2: 27 mmol/L (ref 20–29)
Calcium: 9.2 mg/dL (ref 8.6–10.2)
Chloride: 101 mmol/L (ref 96–106)
Creatinine, Ser: 1.34 mg/dL — ABNORMAL HIGH (ref 0.76–1.27)
Globulin, Total: 2.4 g/dL (ref 1.5–4.5)
Glucose: 129 mg/dL — ABNORMAL HIGH (ref 70–99)
Potassium: 4.7 mmol/L (ref 3.5–5.2)
Sodium: 140 mmol/L (ref 134–144)
Total Protein: 7 g/dL (ref 6.0–8.5)
eGFR: 55 mL/min/{1.73_m2} — ABNORMAL LOW (ref >59)

## 2023-01-02 NOTE — Progress Notes (Signed)
Daily Session Note  Patient Details  Name: Nicholas [name] MRN: [id number] Date of Birth: 1945/05/19 Referring Provider:   Flowsheet Row Pulmonary Rehab from 10/18/2022 in [facility] Cardiac and Pulmonary Rehab  Referring Provider Dr. Birdena Jubilee       Encounter Date: 01/02/2023  Check In:  Session Check In - 01/02/23 0937       Check-In   Supervising physician immediately available to respond to emergencies See telemetry face sheet for immediately available ER MD    Location [facility]-Cardiac & Pulmonary Rehab    Staff Present Cora Collum, RN, BSN, CCRP;Margaret Best, MS, Exercise Physiologist;Noah Tickle, BS, Exercise Physiologist;Maxon Conetta BS, , Exercise Physiologist    Virtual Visit No    Medication changes reported     No    Fall or balance concerns reported    No    Warm-up and Cool-down Performed on first and last piece of equipment    Resistance Training Performed Yes    VAD Patient? No    PAD/SET Patient? No      Pain Assessment   Currently in Pain? No/denies                Social History   Tobacco Use  Smoking Status Never  Smokeless Tobacco Never    Goals Met:  Proper associated with RPD/PD & O2 Sat Independence with exercise equipment Exercise tolerated well No report of concerns or symptoms today  Goals Unmet:  Not Applicable  Comments: Pt able to follow exercise prescription today without complaint.  Will continue to monitor for progression.    Dr. Bethann Punches is Medical Director for [facility] Cardiac Rehabilitation.  Dr. Vida Rigger is Medical Director for [facility] Pulmonary Rehabilitation.

## 2023-01-02 NOTE — Progress Notes (Signed)
Contacted via MyChart   Good morning Nicholas [name], your labs have returned and overall remain stable with exception of mild decline in kidney function again, eGFR and creatinine.  Ensure to get good water intake daily and continue all medications.  Any questions? Keep being stellar!!  Thank you for allowing me to participate in your care.  I appreciate you. Kindest regards, Tehya Leath

## 2023-01-04 ENCOUNTER — Encounter: Payer: Medicare Other | Admitting: *Deleted

## 2023-01-04 DIAGNOSIS — R0609 Other forms of dyspnea: Secondary | ICD-10-CM | POA: Diagnosis not present

## 2023-01-04 NOTE — Progress Notes (Signed)
Remote pacemaker transmission.   

## 2023-01-04 NOTE — Addendum Note (Signed)
Addended by: Geralyn Flash D on: 01/04/2023 03:20 PM   Modules accepted: Orders

## 2023-01-04 NOTE — Progress Notes (Signed)
Daily Session Note  Patient Details  Name: Nicholas [name] MRN: [id number] Date of Birth: Oct 18, 1945 Referring Provider:   Flowsheet Row Pulmonary Rehab from 10/18/2022 in [facility] Cardiac and Pulmonary Rehab  Referring Provider Dr. Birdena Jubilee       Encounter Date: 01/04/2023  Check In:  Session Check In - 01/04/23 0934       Check-In   Supervising physician immediately available to respond to emergencies See telemetry face sheet for immediately available ER MD    Location [facility]-Cardiac & Pulmonary Rehab    Staff Present Ronette Deter, BS, Exercise Physiologist;Meredith Jewel Baize, RN BSN;Maxon Conetta BS, , Exercise Physiologist;Everson Mott Katrinka Blazing, RN, ADN    Virtual Visit No    Medication changes reported     No    Fall or balance concerns reported    No    Warm-up and Cool-down Performed on first and last piece of equipment    Resistance Training Performed Yes    VAD Patient? No    PAD/SET Patient? No      Pain Assessment   Currently in Pain? No/denies                Social History   Tobacco Use  Smoking Status Never  Smokeless Tobacco Never    Goals Met:  Independence with exercise equipment Exercise tolerated well No report of concerns or symptoms today Strength training completed today  Goals Unmet:  Not Applicable  Comments: Pt able to follow exercise prescription today without complaint.  Will continue to monitor for progression.    Dr. Bethann Punches is Medical Director for [facility] Cardiac Rehabilitation.  Dr. Vida Rigger is Medical Director for [facility] Pulmonary Rehabilitation.

## 2023-01-07 ENCOUNTER — Other Ambulatory Visit: Payer: Self-pay | Admitting: Nurse Practitioner

## 2023-01-07 DIAGNOSIS — F339 Major depressive disorder, recurrent, unspecified: Secondary | ICD-10-CM

## 2023-01-07 DIAGNOSIS — I10 Essential (primary) hypertension: Secondary | ICD-10-CM

## 2023-01-09 ENCOUNTER — Encounter: Payer: Medicare Other | Admitting: *Deleted

## 2023-01-09 DIAGNOSIS — R0609 Other forms of dyspnea: Secondary | ICD-10-CM | POA: Diagnosis not present

## 2023-01-09 NOTE — Progress Notes (Signed)
Daily Session Note  Patient Details  Name: Nicholas [name] MRN: [id number] Date of Birth: 1945/03/25 Referring Provider:   Flowsheet Row Pulmonary Rehab from 10/18/2022 in [facility] Cardiac and Pulmonary Rehab  Referring Provider Dr. Birdena Jubilee       Encounter Date: 01/09/2023  Check In:  Session Check In - 01/09/23 0921       Check-In   Supervising physician immediately available to respond to emergencies See telemetry face sheet for immediately available ER MD    Location [facility]-Cardiac & Pulmonary Rehab    Staff Present Maxon Conetta BS, , Exercise Physiologist;Margaret Best, MS, Exercise Physiologist;Ashey Tramontana, RN, BSN, CCRP;Noah Tickle, BS, Exercise Physiologist    Virtual Visit No    Medication changes reported     No    Fall or balance concerns reported    No    Warm-up and Cool-down Performed on first and last piece of equipment    Resistance Training Performed Yes    VAD Patient? No    PAD/SET Patient? No      Pain Assessment   Currently in Pain? No/denies                Social History   Tobacco Use  Smoking Status Never  Smokeless Tobacco Never    Goals Met:  Proper associated with RPD/PD & O2 Sat Independence with exercise equipment Exercise tolerated well No report of concerns or symptoms today  Goals Unmet:  Not Applicable  Comments: Pt able to follow exercise prescription today without complaint.  Will continue to monitor for progression.    Dr. Bethann Punches is Medical Director for [facility] Cardiac Rehabilitation.  Dr. Vida Rigger is Medical Director for [facility] Pulmonary Rehabilitation.

## 2023-01-09 NOTE — Telephone Encounter (Signed)
Requested by interface surescripts. Last labs 01/01/23. Future visit in 2 months. Requested Prescriptions  Pending Prescriptions Disp Refills   solifenacin (VESICARE) 10 MG tablet [Pharmacy Med Name: SOLIFENACIN 10 MG TABLET] 90 tablet 0    Sig: TAKE 1 TABLET BY MOUTH EVERY DAY     Urology:  Bladder Agents 2 Failed - 01/09/2023  8:31 AM      Failed - Cr in normal range and within 360 days    Creatinine  Date Value Ref Range Status  02/26/2014 0.94 0.60 - 1.30 mg/dL Final   Creatinine, Ser  Date Value Ref Range Status  01/01/2023 1.34 (H) 0.76 - 1.27 mg/dL Final         Passed - ALT in normal range and within 360 days    ALT  Date Value Ref Range Status  01/01/2023 25 0 - 44 IU/L Final   SGPT (ALT)  Date Value Ref Range Status  04/07/2013 22 12 - 78 U/L Final   ALT (SGPT) Piccolo, Waived  Date Value Ref Range Status  01/03/2018 24 10 - 47 U/L Final         Passed - AST in normal range and within 360 days    AST  Date Value Ref Range Status  01/01/2023 33 0 - 40 IU/L Final   SGOT(AST)  Date Value Ref Range Status  04/07/2013 28 15 - 37 Unit/L Final   AST (SGOT) Piccolo, Waived  Date Value Ref Range Status  01/03/2018 34 11 - 38 U/L Final         Passed - Valid encounter within last 12 months    Recent Outpatient Visits           1 week ago Type 2 diabetes mellitus with obesity (HCC)   [place] [facility] [place], Corrie Dandy T, NP   2 weeks ago Recurrent UTI   [place] [facility] [place], Megan P, DO   1 month ago Acute cystitis without hematuria   [place] [facility] [place], Megan P, DO   2 months ago Dysuria   [place] [facility] [place], Sherran Needs, NP   3 months ago Type 2 diabetes mellitus with obesity (HCC)   [place] [facility] [place], Corrie Dandy T, NP       Future Appointments             In 2 months Vanna Scotland, MD [facility] [place]   In 2 months  [place], [name], NP [place] [facility], [facility]             venlafaxine XR (EFFEXOR-XR) 75 MG 24 hr capsule [Pharmacy Med Name: VENLAFAXINE HCL ER 75 MG CAP] 270 capsule 0    Sig: TAKE 3 CAPSULES BY MOUTH EVERY DAY     Psychiatry: Antidepressants - SNRI - desvenlafaxine & venlafaxine Failed - 01/09/2023  8:31 AM      Failed - Cr in normal range and within 360 days    Creatinine  Date Value Ref Range Status  02/26/2014 0.94 0.60 - 1.30 mg/dL Final   Creatinine, Ser  Date Value Ref Range Status  01/01/2023 1.34 (H) 0.76 - 1.27 mg/dL Final         Failed - Lipid Panel in normal range within the last 12 months    Cholesterol, Total  Date Value Ref Range Status  01/01/2023 131 100 - 199 mg/dL Final   Cholesterol Piccolo, Waived  Date Value Ref Range  Status  01/03/2018 198 <200 mg/dL Final    Comment:                            Desirable                <200                         Borderline High      200- 239                         High                     >239    LDL Chol Calc (NIH)  Date Value Ref Range Status  01/01/2023 63 0 - 99 mg/dL Final   HDL  Date Value Ref Range Status  01/01/2023 50 >39 mg/dL Final   Triglycerides  Date Value Ref Range Status  01/01/2023 95 0 - 149 mg/dL Final   Triglycerides Piccolo,Waived  Date Value Ref Range Status  01/03/2018 264 (H) <150 mg/dL Final    Comment:                            Normal                   <150                         Borderline High     150 - 199                         High                200 - 499                         Very High                >499          Passed - Completed PHQ-2 or PHQ-9 in the last 360 days      Passed - Last BP in normal range    BP Readings from Last 1 Encounters:  01/01/23 103/63         Passed - Valid encounter within last 6 months    Recent Outpatient Visits           1 week ago Type 2 diabetes mellitus with obesity (HCC)   [place] [facility] [place], Corrie Dandy T, NP   2 weeks ago Recurrent UTI   [place] [facility] [place], Megan P, DO   1 month ago Acute cystitis without hematuria   [place] [facility] [place], Megan P, DO   2 months ago Dysuria   [place] [facility] [place], Sherran Needs, NP   3 months ago Type 2 diabetes mellitus with obesity [facility])   [place] [facility] Marjie Skiff, NP       Future Appointments             In 2 months Vanna Scotland, MD [facility] Urology [place]   In 2 months [place], [name], NP [place] [facility]

## 2023-01-10 ENCOUNTER — Ambulatory Visit: Payer: Self-pay | Admitting: *Deleted

## 2023-01-10 ENCOUNTER — Other Ambulatory Visit: Payer: Medicare Other

## 2023-01-10 ENCOUNTER — Encounter: Payer: Self-pay | Admitting: *Deleted

## 2023-01-10 DIAGNOSIS — R0609 Other forms of dyspnea: Secondary | ICD-10-CM

## 2023-01-10 NOTE — Progress Notes (Signed)
Pulmonary Individual Treatment Plan  Patient Details  Name: Nicholas [name] MRN: [id number] Date of Birth: September 16, 1945 Referring Provider:   Flowsheet Row Pulmonary Rehab from 10/18/2022 in [facility] Cardiac and Pulmonary Rehab  Referring Provider Dr. Birdena Jubilee       Initial Encounter Date:  Flowsheet Row Pulmonary Rehab from 10/18/2022 in [facility] Cardiac and Pulmonary Rehab  Date 10/18/22       Visit Diagnosis: DOE (dyspnea on exertion)  Patient's Home Medications on Admission:  Current Outpatient Medications:    acetaminophen (TYLENOL) 650 MG CR tablet, Take 650 mg by mouth every 8 (eight) hours as needed for pain., Disp: , Rfl:    albuterol (VENTOLIN HFA) 108 (90 Base) MCG/ACT inhaler, INHALE 2 PUFFS BY MOUTH EVERY 6 HOURS AS NEEDED, Disp: 6.7 each, Rfl: 2   apixaban (ELIQUIS) 5 MG TABS tablet, Take 1 tablet (5 mg total) by mouth 2 (two) times daily., Disp: 180 tablet, Rfl: 1   Ascorbic Acid (VITAMIN C) 1000 MG tablet, Take 1,000 mg by mouth daily., Disp: , Rfl:    Blood Glucose Monitoring Suppl (ONETOUCH VERIO) w/Device KIT, Use to check blood sugar 3 times a day and document results, bring to appointments.  Goal is <130 fasting blood sugar and <180 two hours after meals., Disp: 1 kit, Rfl: 0   calcium-vitamin D (OSCAL WITH D) 500-5 MG-MCG tablet, Take 1 tablet by mouth daily with breakfast., Disp: , Rfl:    Cranberry 500 MG CAPS, Take 500 mg by mouth daily., Disp: , Rfl:    empagliflozin (JARDIANCE) 25 MG TABS tablet, Take 1 tablet (25 mg total) by mouth daily., Disp: 30 tablet, Rfl: 4   gabapentin (NEURONTIN) 600 MG tablet, TAKE 1 TABLET BY MOUTH EVERYDAY AT BEDTIME, Disp: 90 tablet, Rfl: 4   glucose blood test strip, Use to check blood sugar 3 times a day and document results, bring to appointments.  Goal is <130 fasting blood sugar and <180 two hours after meals., Disp: 100 each, Rfl: 12   HYDROcodone-acetaminophen (NORCO/VICODIN) 5-325 MG tablet, Take 1 tablet by mouth 2 (two)  times daily., Disp: 60 tablet, Rfl: 0   Lancets (ONETOUCH ULTRASOFT) lancets, Use to check blood sugar 3 times a day and document results, bring to appointments.  Goal is <130 fasting blood sugar and <180 two hours after meals., Disp: 100 each, Rfl: 12   LORazepam (ATIVAN) 1 MG tablet, TAKE 1 TABLET BY MOUTH EVERYDAY AT BEDTIME, Disp: 90 tablet, Rfl: 0   Magnesium 400 MG TABS, Take 400 mg by mouth daily. , Disp: , Rfl:    MELATONIN PO, Take by mouth., Disp: , Rfl:    midodrine (PROAMATINE) 10 MG tablet, TAKE 0.5 TABLETS (5 MG TOTAL) BY MOUTH 3 (THREE) TIMES DAILY., Disp: 135 tablet, Rfl: 3   midodrine (PROAMATINE) 5 MG tablet, Take 1 tablet (5 mg total) by mouth 3 (three) times daily with meals., Disp: 270 tablet, Rfl: 3   Multiple Vitamin (MULTIVITAMIN WITH MINERALS) TABS tablet, Take 1 tablet by mouth daily. Centrum Silver, Disp: , Rfl:    nystatin (MYCOSTATIN/NYSTOP) powder, Apply 1 Application topically 3 (three) times daily., Disp: 60 g, Rfl: 2   nystatin cream (MYCOSTATIN), Apply 1 Application topically 2 (two) times daily., Disp: 30 g, Rfl: 4   Omega-3 Fatty Acids (FISH OIL) 1200 MG CAPS, Take 1,200 mg by mouth daily., Disp: , Rfl:    oxymetazoline (AFRIN) 0.05 % nasal spray, Place 2 sprays into both nostrils at bedtime., Disp: , Rfl:  pantoprazole (PROTONIX) 20 MG tablet, TAKE 1 TABLET (20 MG TOTAL) BY MOUTH 2 (TWO) TIMES DAILY BEFORE A MEAL. (Patient taking differently: Take 20 mg by mouth daily.), Disp: 180 tablet, Rfl: 2   potassium chloride SA (KLOR-CON M) 20 MEQ tablet, Take 2 tablets (40 meq) with Torsemide, Disp: 30 tablet, Rfl: 3   QUEtiapine Fumarate (SEROQUEL XR) 150 MG 24 hr tablet, TAKE 1 TABLET BY MOUTH EVERYDAY AT BEDTIME, Disp: 90 tablet, Rfl: 4   rOPINIRole (REQUIP) 0.25 MG tablet, Take 0.25 mg by mouth 4 (four) times daily., Disp: , Rfl:    rosuvastatin (CRESTOR) 40 MG tablet, TAKE 1 TABLET BY MOUTH EVERY DAY, Disp: 90 tablet, Rfl: 1   solifenacin (VESICARE) 10 MG tablet,  TAKE 1 TABLET BY MOUTH EVERY DAY, Disp: 90 tablet, Rfl: 0   tiZANidine (ZANAFLEX) 4 MG tablet, Take 1 tablet (4 mg total) by mouth every 6 (six) hours as needed for muscle spasms., Disp: 90 tablet, Rfl: 4   torsemide (DEMADEX) 20 MG tablet, TAKE 2 TABLETS (40 MG TOTAL) BY MOUTH DAILY AS NEEDED (FLUID RETENTION)., Disp: 90 tablet, Rfl: 0   venlafaxine XR (EFFEXOR-XR) 75 MG 24 hr capsule, TAKE 3 CAPSULES BY MOUTH EVERY DAY, Disp: 270 capsule, Rfl: 0  Past Medical History: Past Medical History:  Diagnosis Date   Abdominal pain, chronic, right lower quadrant 12/13/2020   Anterior urethral stricture    Anxiety    Arthritis    a. knees, hips, hands;  b. 11/2013 s/p L TKA @ [facility].   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 Dr Graciela Husbands   Depression    DVT (deep venous thrombosis) (HCC)    Erosive esophagitis    Gross hematuria    Hyperlipemia    Hypertension    borderline   Internal hemorrhoids    Phimosis    Pulmonary embolism (HCC)     Tobacco Use: Social History   Tobacco Use  Smoking Status Never  Smokeless Tobacco Never    Labs: Review Flowsheet  More data exists      Latest Ref Rng & Units 11/29/2021 03/01/2022 05/30/2022 10/02/2022 01/01/2023  Labs for ITP Cardiac and Pulmonary Rehab  Cholestrol 100 - 199 mg/dL 308  657  846  962  952   LDL (calc) 0 - 99 mg/dL 63  58  59  841  63   HDL-C >39 mg/dL 49  58  49  42  50   Trlycerides 0 - 149 mg/dL 324  97  401  027  95   Hemoglobin A1c 4.8 - 5.6 % 6.8  6.8  7.0  7.0  6.3      Pulmonary Assessment Scores:  Pulmonary Assessment Scores     Row Name 10/18/22 1252         ADL UCSD   ADL Phase Entry     SOB Score total 89     Rest 0     Walk 5     Stairs 5     Bath 5     Dress 4     Shop 5       CAT Score   CAT Score 20       mMRC Score   mMRC Score 3               UCSD:  Self-administered rating of dyspnea associated with activities of daily living (ADLs) 6-point scale (0 = "not at all" to 5 = "maximal or unable to do because of breathlessness")  Scoring Scores range from 0 to 120.  Minimally important difference is 5 units  CAT: CAT can identify the health impairment of COPD patients and is better correlated with disease progression.  CAT has a scoring range of zero to 40. The CAT score is classified into four groups of low (less than 10), medium (10 - 20), high (21-30) and very high (31-40) based on the impact level of disease on health status. A CAT score over 10 suggests significant symptoms.  A worsening CAT score could be explained by an exacerbation, poor medication adherence, poor inhaler technique, or progression of COPD or comorbid conditions.  CAT MCID is 2 points  mMRC: mMRC (Modified Medical Research Council) Dyspnea Scale is used to assess the degree of baseline functional disability in patients of respiratory disease due to dyspnea. No minimal important difference is established. A decrease in score of 1 point or greater is considered a positive change.   Pulmonary Function Assessment:  Pulmonary Function Assessment - 10/09/22 1023       Breath   Shortness of Breath Yes;Limiting activity             Exercise Target Goals: Exercise Program Goal: Individual exercise prescription set using results from initial 6 min walk test and THRR while considering  patient's activity barriers and safety.   Exercise Prescription Goal: Initial exercise prescription builds to 30-45 minutes a day of aerobic activity, 2-3 days per week.  Home exercise guidelines will be given to patient during program as part of exercise prescription that the participant will acknowledge.  Education: Aerobic Exercise: - Group verbal and visual presentation on the components of exercise prescription. Introduces F.I.T.T principle from ACSM for exercise  prescriptions.  Reviews F.I.T.T. principles of aerobic exercise including progression. Written material given at graduation.   Education: Resistance Exercise: - Group verbal and visual presentation on the components of exercise prescription. Introduces F.I.T.T principle from ACSM for exercise prescriptions  Reviews F.I.T.T. principles of resistance exercise including progression. Written material given at graduation.    Education: Exercise & Equipment Safety: - Individual verbal instruction and demonstration of equipment use and safety with use of the equipment. Flowsheet Row Pulmonary Rehab from 12/07/2022 in [facility] Cardiac and Pulmonary Rehab  Date 10/18/22  Educator [facility]  Instruction Review Code 1- Verbalizes Understanding       Education: Exercise Physiology & General Exercise Guidelines: - Group verbal and written instruction with models to review the exercise physiology of the cardiovascular system and associated critical values. Provides general exercise guidelines with specific guidelines to those with heart or lung disease.  Flowsheet Row Pulmonary Rehab from 12/07/2022 in [facility] Cardiac and Pulmonary Rehab  Date 11/09/22  Educator [facility]  Instruction Review Code 1- [employer] Understanding       Education: Flexibility, Balance, Mind/Body Relaxation: - Group verbal and visual presentation with interactive activity on the components of exercise prescription. Introduces F.I.T.T principle from ACSM for exercise prescriptions. Reviews F.I.T.T. principles of flexibility and balance exercise training including progression. Also discusses the mind body connection.  Reviews various relaxation techniques to help reduce and manage stress (i.e. Deep breathing, progressive muscle relaxation, and visualization). Balance handout provided to take home. Written material given at graduation.   Activity Barriers & Risk Stratification:  Activity Barriers & Cardiac Risk Stratification - 10/18/22 1236  Activity Barriers & Cardiac Risk Stratification   Activity Barriers Right Hip Replacement;Left Knee Replacement;Left Hip Replacement;Right Knee Replacement             6 Minute Walk:  6 Minute Walk     Row Name 10/18/22 1233         6 Minute Walk   Phase Initial     Distance 825 feet     Walk Time 6 minutes     # of Rest Breaks 0     MPH 1.56     METS 1.07     RPE 13     Perceived Dyspnea  3     VO2 Peak 3.76     Symptoms Yes (comment)     Comments SOB     Resting HR 99 bpm     Resting BP 110/60     Resting Oxygen Saturation  95 %     Exercise Oxygen Saturation  during 6 min walk 93 %     Max Ex. HR 107 bpm     Max Ex. BP 128/70     2 Minute Post BP 118/76       Interval HR   1 Minute HR 81     2 Minute HR 70     3 Minute HR 84     4 Minute HR 82     5 Minute HR 77     6 Minute HR 107     2 Minute Post HR 102     Interval Heart Rate? Yes       Interval Oxygen   Interval Oxygen? Yes     Baseline Oxygen Saturation % 95 %     1 Minute Oxygen Saturation % 93 %     1 Minute Liters of Oxygen 0 L     2 Minute Oxygen Saturation % 94 %     2 Minute Liters of Oxygen 0 L     3 Minute Oxygen Saturation % 93 %     3 Minute Liters of Oxygen 0 L     4 Minute Oxygen Saturation % 93 %     4 Minute Liters of Oxygen 0 L     5 Minute Oxygen Saturation % 94 %     5 Minute Liters of Oxygen 0 L     6 Minute Oxygen Saturation % 94 %     6 Minute Liters of Oxygen 0 L     2 Minute Post Oxygen Saturation % 94 %     2 Minute Post Liters of Oxygen 0 L             Oxygen Initial Assessment:  Oxygen Initial Assessment - 10/18/22 1252       Home Oxygen   Home Oxygen Device None    Sleep Oxygen Prescription CPAP    Liters per minute 0    Home Exercise Oxygen Prescription None    Home Resting Oxygen Prescription None    Compliance with Home Oxygen Use Yes      Initial 6 min Walk   Oxygen Used None      Program Oxygen Prescription   Program Oxygen  Prescription None      Intervention   Short Term Goals To learn and exhibit compliance with exercise, home and travel O2 prescription;To learn and understand importance of monitoring SPO2 with pulse oximeter and demonstrate accurate use of the pulse oximeter.;To learn and understand importance of maintaining oxygen saturations>88%;To learn  and demonstrate proper pursed lip breathing techniques or other breathing techniques. ;To learn and demonstrate proper use of respiratory medications    Long  Term Goals Exhibits compliance with exercise, home  and travel O2 prescription;Verbalizes importance of monitoring SPO2 with pulse oximeter and return demonstration;Maintenance of O2 saturations>88%;Exhibits proper breathing techniques, such as pursed lip breathing or other method taught during program session;Compliance with respiratory medication;Demonstrates proper use of MDI's             Oxygen Re-Evaluation:  Oxygen Re-Evaluation     Row Name 11/09/22 0928 11/30/22 0952           Program Oxygen Prescription   Program Oxygen Prescription None None        Home Oxygen   Home Oxygen Device None None      Sleep Oxygen Prescription CPAP CPAP      Liters per minute 0 0      Home Exercise Oxygen Prescription None None      Home Resting Oxygen Prescription None None      Compliance with Home Oxygen Use Yes Yes        Goals/Expected Outcomes   Short Term Goals To learn and demonstrate proper pursed lip breathing techniques or other breathing techniques.  To learn and demonstrate proper pursed lip breathing techniques or other breathing techniques.       Long  Term Goals Exhibits proper breathing techniques, such as pursed lip breathing or other method taught during program session Exhibits proper breathing techniques, such as pursed lip breathing or other method taught during program session      Comments Informed patient how to perform the Pursed Lipped breathing technique. Told patient to  Inhale through the nose and out the mouth with pursed lips to keep their airways open, help oxygenate them better, practice when at rest or doing strenuous activity. Patient Verbalizes understanding of technique and will work on and be reiterated during [facility]. Reviewed PLB techique with pt. He states that he continues to practice PLB at rest and during exercise. He reports that his pulmonologist also encouraged him to contnue to eat correctly and exercise to improve SOB.      Goals/Expected Outcomes Short: use PLB with exertion. Long: use PLB on exertion proficiently and independently. Short: Continue to use PLB with exertion. Long: Become proficient and independent with PLB.               Oxygen Discharge (Final Oxygen Re-Evaluation):  Oxygen Re-Evaluation - 11/30/22 0952       Program Oxygen Prescription   Program Oxygen Prescription None      Home Oxygen   Home Oxygen Device None    Sleep Oxygen Prescription CPAP    Liters per minute 0    Home Exercise Oxygen Prescription None    Home Resting Oxygen Prescription None    Compliance with Home Oxygen Use Yes      Goals/Expected Outcomes   Short Term Goals To learn and demonstrate proper pursed lip breathing techniques or other breathing techniques.     Long  Term Goals Exhibits proper breathing techniques, such as pursed lip breathing or other method taught during program session    Comments Reviewed PLB techique with pt. He states that he continues to practice PLB at rest and during exercise. He reports that his pulmonologist also encouraged him to contnue to eat correctly and exercise to improve SOB.    Goals/Expected Outcomes Short: Continue to use PLB with exertion. Long: Become proficient  and independent with PLB.             Initial Exercise Prescription:  Initial Exercise Prescription - 10/18/22 1200       Date of Initial Exercise RX and Referring Provider   Date 10/18/22    Referring Provider Dr. Birdena Jubilee      Oxygen   Maintain Oxygen Saturation 88% or higher      NuStep   Level 1   T6   SPM 80    Minutes 15    METs 1.07      REL-XR   Level 1    Speed 50    Minutes 15    METs 1.07      Track   Laps 10    Minutes 15    METs 1.54      Prescription Details   Frequency (times per week) 2    Duration Progress to 30 minutes of continuous aerobic without signs/symptoms of physical distress      Intensity   THRR 40-80% of Max Heartrate 116-134    Ratings of Perceived Exertion 11-13    Perceived Dyspnea 0-4      Progression   Progression Continue to progress workloads to maintain intensity without signs/symptoms of physical distress.      Resistance Training   Training Prescription Yes    Weight 3    Reps 10-15             Perform Capillary Blood Glucose checks as needed.  Exercise Prescription Changes:   Exercise Prescription Changes     Row Name 10/18/22 1200 10/19/22 1000 10/26/22 1600 11/09/22 1600 11/22/22 1100     Response to Exercise   Blood Pressure (Admit) 110/60 110/60 114/64 120/68 124/74   Blood Pressure (Exercise) 128/70 128/70 128/70 136/68 150/70   Blood Pressure (Exit) 118/76 118/76 130/78 108/58 122/62   Heart Rate (Admit) 99 bpm 99 bpm 98 bpm 87 bpm 86 bpm   Heart Rate (Exercise) 107 bpm 107 bpm 98 bpm 108 bpm 109 bpm   Heart Rate (Exit) 92 bpm 92 bpm 99 bpm 94 bpm 97 bpm   Oxygen Saturation (Admit) 95 % 95 % 93 % 94 % 93 %   Oxygen Saturation (Exercise) 93 % 93 % 89 % 91 % 91 %   Oxygen Saturation (Exit) 94 % 94 % 93 % 94 % 93 %   Rating of Perceived Exertion (Exercise) 13 13 15 15 15    Perceived Dyspnea (Exercise) 3 3 2 2 1    Symptoms SOB SOB -- SOB SOB   Comments 6 MWT results 6 MWT results -- -- --   Duration -- -- Continue with 30 min of aerobic exercise without signs/symptoms of physical distress. Continue with 30 min of aerobic exercise without signs/symptoms of physical distress. Continue with 30 min of aerobic exercise  without signs/symptoms of physical distress.   Intensity -- -- THRR unchanged THRR unchanged THRR unchanged     Progression   Progression -- -- Continue to progress workloads to maintain intensity without signs/symptoms of physical distress. Continue to progress workloads to maintain intensity without signs/symptoms of physical distress. Continue to progress workloads to maintain intensity without signs/symptoms of physical distress.   Average METs -- -- -- 2.21 2.49     Resistance Training   Training Prescription -- Yes Yes Yes Yes   Weight -- 3 3 3  lb 3 lb   Reps -- 10-15 10-15 10-15 10-15     Interval Training  Interval Training -- -- -- No No     NuStep   Level -- 1  T6 1  T6 4  T6 nustep 4  T6 nustep   SPM -- 80 80 -- --   Minutes -- 15 15 15 15    METs -- 1.07 2.4 2.6 2.9     REL-XR   Level -- 1 1 2 4    Speed -- 50 50 -- --   Minutes -- 15 15 15 15    METs -- 1.07 1.07 3.4 2.2     Track   Laps -- 10 -- 15  Hallway 24  hallway   Minutes -- 15 -- 15 15   METs -- 1.54 -- 1.65 2.03     Oxygen   Maintain Oxygen Saturation -- -- -- 88% or higher 88% or higher    Row Name 12/07/22 1500 12/19/22 1500 12/26/22 1000 01/04/23 1400       Response to Exercise   Blood Pressure (Admit) 120/72 128/70 -- 134/70    Blood Pressure (Exercise) 130/70 -- -- --    Blood Pressure (Exit) 138/80 126/72 -- 118/70    Heart Rate (Admit) 80 bpm 77 bpm -- 82 bpm    Heart Rate (Exercise) 113 bpm 106 bpm -- 106 bpm    Heart Rate (Exit) 91 bpm 90 bpm -- 89 bpm    Oxygen Saturation (Admit) 91 % 93 % -- 90 %    Oxygen Saturation (Exercise) 91 % 90 % -- 91 %    Oxygen Saturation (Exit) 93 % 96 % -- 92 %    Rating of Perceived Exertion (Exercise) 13 14 -- 13    Perceived Dyspnea (Exercise) 1 2 -- 1    Symptoms none none -- none    Duration Continue with 30 min of aerobic exercise without signs/symptoms of physical distress. Continue with 30 min of aerobic exercise without signs/symptoms of physical  distress. -- Continue with 30 min of aerobic exercise without signs/symptoms of physical distress.    Intensity THRR unchanged THRR unchanged -- THRR unchanged      Progression   Progression Continue to progress workloads to maintain intensity without signs/symptoms of physical distress. Continue to progress workloads to maintain intensity without signs/symptoms of physical distress. -- Continue to progress workloads to maintain intensity without signs/symptoms of physical distress.    Average METs 2.9 2.94 -- 3.38      Resistance Training   Training Prescription Yes Yes -- Yes    Weight 3 lb 3 lb -- 3 lb    Reps 10-15 10-15 -- 10-15      Interval Training   Interval Training No No -- No      NuStep   Level 4  T6 5  T6 nustep -- 5  T6 nustep    Minutes 15 15 -- 15    METs 3.1 3.1 -- 3.2      REL-XR   Level 4 5 -- 5    Minutes 15 15 -- 15    METs 4.2 4.4 -- 4.4      Biostep-RELP   Level 3 -- -- --    Minutes 15 -- -- --    METs 3 -- -- --      Track   Laps 30 30 -- 30  Hallway    Minutes 15 15 -- 15    METs 2.63 2.63 -- 2.63      Home Exercise Plan   Plans to continue exercise at -- --  Home (comment)  states that he will continue his use of hand weights as well as walking around with his wife while they run errands or go to doctors appointments. Home (comment)  states that he will continue his use of hand weights as well as walking around with his wife while they run errands or go to doctors appointments.    Frequency -- -- Add 2 additional days to program exercise sessions. Add 2 additional days to program exercise sessions.    Initial Home Exercises Provided -- -- 12/26/22 12/26/22      Oxygen   Maintain Oxygen Saturation 88% or higher 88% or higher -- 88% or higher             Exercise Comments:   Exercise Comments     Row Name 10/19/22 1007           Exercise Comments First full day of exercise!  Patient was oriented to gym and equipment including  functions, settings, policies, and procedures.  Patient's individual exercise prescription and treatment plan were reviewed.  All starting workloads were established based on the results of the 6 minute walk test done at initial orientation visit.  The plan for exercise progression was also introduced and progression will be customized based on patient's performance and goals.                Exercise Goals and Review:   Exercise Goals     Row Name 10/18/22 1247             Exercise Goals   Increase Physical Activity Yes       Intervention Provide advice, education, support and counseling about physical activity/exercise needs.;Develop an individualized exercise prescription for aerobic and resistive training based on initial evaluation findings, risk stratification, comorbidities and participant's personal goals.       Expected Outcomes Short Term: Attend rehab on a regular basis to increase amount of physical activity.;Long Term: Add in home exercise to make exercise part of routine and to increase amount of physical activity.;Long Term: Exercising regularly at least 3-5 days a week.       Increase Strength and Stamina Yes       Intervention Provide advice, education, support and counseling about physical activity/exercise needs.;Develop an individualized exercise prescription for aerobic and resistive training based on initial evaluation findings, risk stratification, comorbidities and participant's personal goals.       Expected Outcomes Short Term: Increase workloads from initial exercise prescription for resistance, speed, and METs.;Short Term: Perform resistance training exercises routinely during rehab and add in resistance training at home;Long Term: Improve cardiorespiratory fitness, muscular endurance and strength as measured by increased METs and functional capacity ( )       Able to understand and use rate of perceived exertion (RPE) scale Yes       Intervention Provide  education and explanation on how to use RPE scale       Expected Outcomes Short Term: Able to use RPE daily in rehab to express subjective intensity level;Long Term:  Able to use RPE to guide intensity level when exercising independently       Able to understand and use Dyspnea scale Yes       Intervention Provide education and explanation on how to use Dyspnea scale       Expected Outcomes Short Term: Able to use Dyspnea scale daily in rehab to express subjective sense of shortness of breath during exertion;Long Term: Able to use Dyspnea scale to  guide intensity level when exercising independently       Knowledge and understanding of Target Heart Rate Range (THRR) Yes       Intervention Provide education and explanation of THRR including how the numbers were predicted and where they are located for reference       Expected Outcomes Short Term: Able to state/look up THRR;Long Term: Able to use THRR to govern intensity when exercising independently;Short Term: Able to use daily as guideline for intensity in rehab       Able to check pulse independently Yes       Intervention Provide education and demonstration on how to check pulse in carotid and radial arteries.;Review the importance of being able to check your own pulse for safety during independent exercise       Expected Outcomes Short Term: Able to explain why pulse checking is important during independent exercise;Long Term: Able to check pulse independently and accurately       Understanding of Exercise Prescription Yes       Intervention Provide education, explanation, and written materials on patient's individual exercise prescription       Expected Outcomes Short Term: Able to explain program exercise prescription;Long Term: Able to explain home exercise prescription to exercise independently                Exercise Goals Re-Evaluation :  Exercise Goals Re-Evaluation     Row Name 10/19/22 1007 10/26/22 1700 11/09/22 1603 11/22/22  1107 12/07/22 1512     Exercise Goal Re-Evaluation   Exercise Goals Review Able to understand and use rate of perceived exertion (RPE) scale;Able to understand and use Dyspnea scale;Knowledge and understanding of Target Heart Rate Range (THRR);Understanding of Exercise Prescription Increase Physical Activity;Understanding of Exercise Prescription;Increase Strength and Stamina Increase Physical Activity;Increase Strength and Stamina;Understanding of Exercise Prescription Increase Physical Activity;Increase Strength and Stamina;Understanding of Exercise Prescription Increase Physical Activity;Increase Strength and Stamina;Understanding of Exercise Prescription   Comments Reviewed RPE and dyspnea scale, THR and program prescription with pt today.  Pt voiced understanding and was given a copy of goals to take home. Orvilla Fus is off to a good start in the program. He has completed one exercise session on this review. On his first day, he was able to follow his exercise prescription with the T6, and only was able to do 5 minutes on the XR. We will continue to monitor his progress in the program. Orvilla Fus is doing well in rehab. He has increased his laps walking to 15 laps in the hallway. He also improved to level 4 on the T6 nustep and level 2 on the XR. He continues to do well with 3 lb handweights for resistance training as well. We will continue to monitor his progress in the program. Orvilla Fus continues to do well in rehab. He recently increased his number of laps walked in the hallways to 24 laps! He also increased to level 4 on the XR and stayed consistent at level 4 on the T6 nustep. We will continue to monitor his progress in the program. Orvilla Fus continues to make improvements in rehab. He has been able to increase his number of laps in the hallway to 30!! He was also able to maintain level 4 on the T6 nustep. We will continue to monitor his progress in the program.   Expected Outcomes Short: Use RPE daily to regulate  intensity. Long: Follow program prescription in THR. Short: Continue to follow current exercise prescription. Long: Continue exercise to improve strength  and stamina. Short: Continue to push for more laps on the track. Long: Continue exercise to improve strength and stamina. Short: Continue to progressively increase workloads. Long: Continue exercise to improve strength and stamina. Short: Continue to progressively increase workloads. Long: Continue exercise to improve strength and stamina.    Row Name 12/19/22 1524 12/26/22 1009 01/04/23 1436         Exercise Goal Re-Evaluation   Exercise Goals Review Increase Physical Activity;Increase Strength and Stamina;Understanding of Exercise Prescription Increase Physical Activity;Able to understand and use Dyspnea scale;Understanding of Exercise Prescription;Knowledge and understanding of Target Heart Rate Range (THRR);Increase Strength and Stamina;Able to understand and use rate of perceived exertion (RPE) scale;Able to check pulse independently Increase Physical Activity;Increase Strength and Stamina;Understanding of Exercise Prescription     Comments Orvilla Fus continues to do well in rehab. He recently improved to level 5 on the T6 nustep and XR. He also continues to walk 30 laps on the track and use 3 lb hand weights for resistance training. We will continue to monitor his progress in the program. Reviewed home exercise with pt today from 9:50 to 10:02.  Pt plans to continue strength training with his 5 lb hand weights, as well as walking with his wife inside the mall or while running errands for exercise.  Reviewed THR, pulse, RPE, sign and symptoms, pulse oximetery and when to call 911 or MD.  Also discussed weather considerations and indoor options.  Pt voiced understanding. Orvilla Fus is doing well in the program. He continues to do well on both the XR and T6 nustep at level 5. He also continues to walk the hallway and has consistently reached 30 laps. He is doing  well with 3 lb hand weights for resistance training as well. We will continue to monitor his progress in the program.     Expected Outcomes Short: Increase to 4 lb hand weights for resistance training. Long: Continue exercise to improve strength and stamina. Short: Implement and designate time at home for exercise. Long: Continue exercise to improve strength and stamina. Short: Continue to push for more laps during walking. Long: Continue exercise to improve strength and stamina.              Discharge Exercise Prescription (Final Exercise Prescription Changes):  Exercise Prescription Changes - 01/04/23 1400       Response to Exercise   Blood Pressure (Admit) 134/70    Blood Pressure (Exit) 118/70    Heart Rate (Admit) 82 bpm    Heart Rate (Exercise) 106 bpm    Heart Rate (Exit) 89 bpm    Oxygen Saturation (Admit) 90 %    Oxygen Saturation (Exercise) 91 %    Oxygen Saturation (Exit) 92 %    Rating of Perceived Exertion (Exercise) 13    Perceived Dyspnea (Exercise) 1    Symptoms none    Duration Continue with 30 min of aerobic exercise without signs/symptoms of physical distress.    Intensity THRR unchanged      Progression   Progression Continue to progress workloads to maintain intensity without signs/symptoms of physical distress.    Average METs 3.38      Resistance Training   Training Prescription Yes    Weight 3 lb    Reps 10-15      Interval Training   Interval Training No      NuStep   Level 5   T6 nustep   Minutes 15    METs 3.2      REL-XR  Level 5    Minutes 15    METs 4.4      Track   Laps 30   Hallway   Minutes 15    METs 2.63      Home Exercise Plan   Plans to continue exercise at Home (comment)   states that he will continue his use of hand weights as well as walking around with his wife while they run errands or go to doctors appointments.   Frequency Add 2 additional days to program exercise sessions.    Initial Home Exercises Provided  12/26/22      Oxygen   Maintain Oxygen Saturation 88% or higher             Nutrition:  Target Goals: Understanding of nutrition guidelines, daily intake of sodium 1500mg , cholesterol 200mg , calories 30% from fat and 7% or less from saturated fats, daily to have 5 or more servings of fruits and vegetables.  Education: All About Nutrition: -Group instruction provided by verbal, written material, interactive activities, discussions, models, and posters to present general guidelines for heart healthy nutrition including fat, fiber, MyPlate, the role of sodium in heart healthy nutrition, utilization of the nutrition label, and utilization of this knowledge for meal planning. Follow up email sent as well. Written material given at graduation. Flowsheet Row Pulmonary Rehab from 12/07/2022 in [facility] Cardiac and Pulmonary Rehab  Date 12/07/22  Educator JG  Instruction Review Code 1- Verbalizes Understanding       Biometrics:  Pre Biometrics - 10/18/22 1248       Pre Biometrics   Height 5' 11.5" (1.816 m)    Weight 280 lb 6.4 oz (127.2 kg)    Waist Circumference 51 inches    Hip Circumference 53.5 inches    Waist to Hip Ratio 0.95 %    BMI (Calculated) 38.57    Single Leg Stand 1.6 seconds              Nutrition Therapy Plan and Nutrition Goals:  Nutrition Therapy & Goals - 10/18/22 1319       Nutrition Therapy   Diet Cardiac, Low Na    Protein (specify units) 75    Fiber 30 grams    Whole Grain Foods 3 servings    Saturated Fats 15 max. grams    Fruits and Vegetables 5 servings/day    Sodium 2 grams      Personal Nutrition Goals   Nutrition Goal Drink 2 bottles of water daily    Personal Goal #2 Eat something nutrient/calorie dense at every meal hour    Personal Goal #3 Pair a complex carb with a protein or healthy fat    Comments Patient drinking ~20oz of water daily. Spoke about the importance of hydration and set short term goal of drinking 2 bottles per day  (~32oz), with a long term goal of ~64oz daily. He reports that he likes to snack on sweets, but his appetite has been less than it used to be, often not missing a meal or getting full quickly. Reviewed mediterranean diet handout, educated on types of fats, sources, how to read food label. Educated on how healthy fats can be heart healthy and high in calories. Explain how choosing calorie dense foods such as peanut butter, olives, olives or avocado oils. Reviewed his 24hr food recall, explained how he should look to pair complex carbs with a protein or healthy fats when making meals or snacks. Brainstormed and built out a few example meals and  snacks focusing on getting enough nutrition with his poor appetite.      Intervention Plan   Intervention Prescribe, educate and counsel regarding individualized specific dietary modifications aiming towards targeted core components such as weight, hypertension, lipid management, diabetes, heart failure and other comorbidities.;Nutrition handout(s) given to patient.    Expected Outcomes Short Term Goal: Understand basic principles of dietary content, such as calories, fat, sodium, cholesterol and nutrients.;Short Term Goal: A plan has been developed with personal nutrition goals set during dietitian appointment.;Long Term Goal: Adherence to prescribed nutrition plan.             Nutrition Assessments:  MEDIFICTS Score Key: >=70 Need to make dietary changes  40-70 Heart Healthy Diet <= 40 Therapeutic Level Cholesterol Diet  Flowsheet Row Pulmonary Rehab from 10/18/2022 in [facility] Cardiac and Pulmonary Rehab  Picture Your Plate Total Score on Admission 58      Picture Your Plate Scores: <16 Unhealthy dietary pattern with much room for improvement. 41-50 Dietary pattern unlikely to meet recommendations for good health and room for improvement. 51-60 More healthful dietary pattern, with some room for improvement.  >60 Healthy dietary pattern, although there  may be some specific behaviors that could be improved.   Nutrition Goals Re-Evaluation:  Nutrition Goals Re-Evaluation     Row Name 11/09/22 [phone] 11/30/22 0943 01/02/23 0937         Goals   Current Weight 276 lb (125.2 kg) -- --     Comment Patient was informed on why it is important to maintain a balanced diet when dealing with Respiratory issues. Explained that it takes a lot of energy to breath and when they are short of breath often they will need to have a good diet to help keep up with the calories they are expending for breathing. Orvilla Fus states that he still is working on drinking plenty of water. He has been drinking a flavored water called Fruit2O. He also has been working on getting more protein as discussed with the RD. He has been eating more peanut butter with fruit as a snack to help with this. He also has not been adding salt at the table to help limit sodium intake. He is doing well with the water goal of 2 bottles per day,  set goat to aim for 2-2.5bottles per day. He is eating apples and peanut butter. He is doing better with protien, discussed protein shakes. his portions are smaller. Watching sodium intake when reading labels     Expected Outcome Short: Choose and plan snacks accordingly to patients caloric intake to improve breathing. Long: Maintain a diet independently that meets their caloric intake to aid in daily shortness of breath. Short: Continue to get more protein and water. Long: Continue to practice healthy eating patterns discussed with RD. Short: drink 2-2.5bottles of water daily, try protein shakes. Long: COntinue to work on healthy eating patterns while meeting nutrition goals              Nutrition Goals Discharge (Final Nutrition Goals Re-Evaluation):  Nutrition Goals Re-Evaluation - 01/02/23 0937       Goals   Comment He is doing well with the water goal of 2 bottles per day,  set goat to aim for 2-2.5bottles per day. He is eating apples and peanut  butter. He is doing better with protien, discussed protein shakes. his portions are smaller. Watching sodium intake when reading labels    Expected Outcome Short: drink 2-2.5bottles of water daily, try protein shakes.  Long: COntinue to work on healthy eating patterns while meeting nutrition goals             Psychosocial: Target Goals: Acknowledge presence or absence of significant depression and/or stress, maximize coping skills, provide positive support system. Participant is able to verbalize types and ability to use techniques and skills needed for reducing stress and depression.   Education: Stress, Anxiety, and Depression - Group verbal and visual presentation to define topics covered.  Reviews how body is impacted by stress, anxiety, and depression.  Also discusses healthy ways to reduce stress and to treat/manage anxiety and depression.  Written material given at graduation. Flowsheet Row Pulmonary Rehab from 12/07/2022 in [facility] Cardiac and Pulmonary Rehab  Date 11/02/22  Educator SB  Instruction Review Code 1- [employer] Understanding       Education: Sleep Hygiene -Provides group verbal and written instruction about how sleep can affect your health.  Define sleep hygiene, discuss sleep cycles and impact of sleep habits. Review good sleep hygiene tips.    Initial Review & Psychosocial Screening:  Initial Psych Review & Screening - 10/09/22 1025       Initial Review   Current issues with Current Psychotropic Meds;Current Sleep Concerns      Family Dynamics   Good Support System? Yes    Comments Orvilla Fus has a problem with his son and is not on speaking. He can look to his wife as a great support system. His sleep has been doing well and takes medication.      Barriers   Psychosocial barriers to participate in program The patient should benefit from training in stress management and relaxation.      Screening Interventions   Interventions Encouraged to exercise;To provide  support and resources with identified psychosocial needs;Provide feedback about the scores to participant    Expected Outcomes Short Term goal: Utilizing psychosocial counselor, staff and physician to assist with identification of specific Stressors or current issues interfering with healing process. Setting desired goal for each stressor or current issue identified.;Long Term Goal: Stressors or current issues are controlled or eliminated.;Short Term goal: Identification and review with participant of any Quality of Life or Depression concerns found by scoring the questionnaire.;Long Term goal: The participant improves quality of Life and PHQ9 Scores as seen by post scores and/or verbalization of changes             Quality of Life Scores:  Scores of 19 and below usually indicate a poorer quality of life in these areas.  A difference of  2-3 points is a clinically meaningful difference.  A difference of 2-3 points in the total score of the Quality of Life Index has been associated with significant improvement in overall quality of life, self-image, physical symptoms, and general health in studies assessing change in quality of life.  PHQ-9: Review Flowsheet  More data exists      12/12/2022 11/30/2022 11/09/2022 10/23/2022 10/18/2022  Depression screen PHQ 2/9  Decreased Interest 0 0 0 1 1  Down, Depressed, Hopeless 0 0 1 1 1   PHQ - 2 Score 0 0 1 2 2   Altered sleeping - 0 0 1 0  Tired, decreased energy - 1 2 3 3   Change in appetite - 0 1 1 1   Feeling bad or failure about yourself  - 0 1 1 2   Trouble concentrating - 0 0 0 0  Moving slowly or fidgety/restless - 0 0 1 0  Suicidal thoughts - 0 0 0 1  PHQ-9 Score - 1 5 9 9   Difficult doing work/chores - Somewhat difficult Not difficult at all Very difficult Very difficult   Interpretation of Total Score  Total Score Depression Severity:  1-4 = Minimal depression, 5-9 = Mild depression, 10-14 = Moderate depression, 15-19 = Moderately severe  depression, 20-27 = Severe depression   Psychosocial Evaluation and Intervention:  Psychosocial Evaluation - 10/09/22 1027       Psychosocial Evaluation & Interventions   Interventions Relaxation education;Encouraged to exercise with the program and follow exercise prescription;Stress management education    Comments Orvilla Fus has a problem with his son and is not on speaking. He can look to his wife as a great support system. His sleep has been doing well and takes medication.    Expected Outcomes Short: Start [facility] to help with mood. Long: Maintain a healthy mental state.    Continue Psychosocial Services  Follow up required by staff             Psychosocial Re-Evaluation:  Psychosocial Re-Evaluation     Row Name 11/09/22 0935 11/30/22 0940 01/02/23 0934         Psychosocial Re-Evaluation   Current issues with Current Stress Concerns Current Stress Concerns None Identified     Comments Reviewed patient health questionnaire (PHQ-9) with patient for follow up. Previously, patients score indicated signs/symptoms of depression.  Reviewed to see if patient is improving symptom wise while in program.  Score improved and patient states that it is because he has been able to workout and have a little more energy. Reviewed patient health questionnaire (PHQ-9) with patient for follow up. Previously, patients score indicated signs/symptoms of depression. Reviewed to see if patient is improving symptom wise while in program. Score improved as patient states that reading and exercise have been good stress relievers for him. He has some stress related to family issues with his son, but it is overall doing well. Patient reports no anxiety depression or stress. He reports has good support system. has his wife to help him with dealing with stress and stay positive mindset. Reports no sleep issues at this time. Knows the holiday season will be busy but generally enjoys the time of the year.     Expected  Outcomes Short: Continue to attend [facility] regularly for regular exercise and social engagement. Long: Continue to improve symptoms and manage a positive mental state. Short: Continue to attend [facility] regularly for regular exercise and social engagement. Long: Continue to maintain positive outlook. Short: continue to attend pulm rehab for exercise and social engagement. Long: Continue to use support system to maintain postive outlook     Interventions Encouraged to attend Pulmonary Rehabilitation for the exercise Encouraged to attend Pulmonary Rehabilitation for the exercise Encouraged to attend Pulmonary Rehabilitation for the exercise     Continue Psychosocial Services  Follow up required by staff Follow up required by staff Follow up required by staff       Initial Review   Source of Stress Concerns -- Family --              Psychosocial Discharge (Final Psychosocial Re-Evaluation):  Psychosocial Re-Evaluation - 01/02/23 0934       Psychosocial Re-Evaluation   Current issues with None Identified    Comments Patient reports no anxiety depression or stress. He reports has good support system. has his wife to help him with dealing with stress and stay positive mindset. Reports no sleep issues at this time. Knows the holiday season will  be busy but generally enjoys the time of the year.    Expected Outcomes Short: continue to attend pulm rehab for exercise and social engagement. Long: Continue to use support system to maintain postive outlook    Interventions Encouraged to attend Pulmonary Rehabilitation for the exercise    Continue Psychosocial Services  Follow up required by staff             Education: Education Goals: Education classes will be provided on a weekly basis, covering required topics. Participant will state understanding/return demonstration of topics presented.  Learning Barriers/Preferences:  Learning Barriers/Preferences - 10/09/22 1022       Learning  Barriers/Preferences   Learning Barriers None    Learning Preferences None             General Pulmonary Education Topics:  Infection Prevention: - Provides verbal and written material to individual with discussion of infection control including proper hand washing and proper equipment cleaning during exercise session. Flowsheet Row Pulmonary Rehab from 12/07/2022 in [facility] Cardiac and Pulmonary Rehab  Date 10/18/22  Educator [facility]  Instruction Review Code 1- Verbalizes Understanding       Falls Prevention: - Provides verbal and written material to individual with discussion of falls prevention and safety. Flowsheet Row Pulmonary Rehab from 12/07/2022 in [facility] Cardiac and Pulmonary Rehab  Date 10/18/22  Educator [facility]  Instruction Review Code 1- Verbalizes Understanding       Chronic Lung Disease Review: - Group verbal instruction with posters, models, PowerPoint presentations and videos,  to review new updates, new respiratory medications, new advancements in procedures and treatments. Providing information on websites and "800" numbers for continued self-education. Includes information about supplement oxygen, available portable oxygen systems, continuous and intermittent flow rates, oxygen safety, concentrators, and Medicare reimbursement for oxygen. Explanation of Pulmonary Drugs, including class, frequency, complications, importance of spacers, rinsing mouth after steroid MDI's, and proper cleaning methods for nebulizers. Review of basic lung anatomy and physiology related to function, structure, and complications of lung disease. Review of risk factors. Discussion about methods for diagnosing sleep apnea and types of masks and machines for OSA. Includes a review of the use of types of environmental controls: home humidity, furnaces, filters, dust mite/pet prevention, HEPA vacuums. Discussion about weather changes, air quality and the benefits of nasal washing. Instruction on Warning  signs, infection symptoms, calling MD promptly, preventive modes, and value of vaccinations. Review of effective airway clearance, coughing and/or vibration techniques. Emphasizing that all should Create an Action Plan. Written material given at graduation. Flowsheet Row Pulmonary Rehab from 12/07/2022 in [facility] Cardiac and Pulmonary Rehab  Education need identified 10/18/22  Date 10/19/22  Educator [facility]  Instruction Review Code 1- Verbalizes Understanding       AED/CPR: - Group verbal and written instruction with the use of models to demonstrate the basic use of the AED with the basic ABC's of resuscitation.    Anatomy and Cardiac Procedures: - Group verbal and visual presentation and models provide information about basic cardiac anatomy and function. Reviews the testing methods done to diagnose heart disease and the outcomes of the test results. Describes the treatment choices: Medical Management, Angioplasty, or Coronary Bypass Surgery for treating various heart conditions including Myocardial Infarction, Angina, Valve Disease, and Cardiac Arrhythmias.  Written material given at graduation.   Medication Safety: - Group verbal and visual instruction to review commonly prescribed medications for heart and lung disease. Reviews the medication, class of the drug, and side effects. Includes the steps to  properly store meds and maintain the prescription regimen.  Written material given at graduation.   Other: -Provides group and verbal instruction on various topics (see comments)   Knowledge Questionnaire Score:  Knowledge Questionnaire Score - 10/18/22 1252       Knowledge Questionnaire Score   Pre Score 15/18              Core Components/Risk Factors/Patient Goals at Admission:  Personal Goals and Risk Factors at Admission - 10/18/22 1251       Core Components/Risk Factors/Patient Goals on Admission    Weight Management Yes;Weight Loss    Intervention Weight Management:  Develop a combined nutrition and exercise program designed to reach desired caloric intake, while maintaining appropriate intake of nutrient and fiber, sodium and fats, and appropriate energy expenditure required for the weight goal.;Weight Management: Provide education and appropriate resources to help participant work on and attain dietary goals.;Weight Management/Obesity: Establish reasonable short term and long term weight goals.;Obesity: Provide education and appropriate resources to help participant work on and attain dietary goals.    Admit Weight 280 lb 6.4 oz (127.2 kg)    Goal Weight: Short Term 270 lb (122.5 kg)    Goal Weight: Long Term 225 lb (102.1 kg)    Expected Outcomes Short Term: Continue to assess and modify interventions until short term weight is achieved;Long Term: Adherence to nutrition and physical activity/exercise program aimed toward attainment of established weight goal;Weight Loss: Understanding of general recommendations for a balanced deficit meal plan, which promotes 1-2 lb weight loss per week and includes a negative energy balance of [phone] kcal/d;Understanding recommendations for meals to include 15-35% energy as protein, 25-35% energy from fat, 35-60% energy from carbohydrates, less than 200mg  of dietary cholesterol, 20-35 gm of total fiber daily;Understanding of distribution of calorie intake throughout the day with the consumption of 4-5 meals/snacks    Improve shortness of breath with ADL's Yes    Intervention Provide education, individualized exercise plan and daily activity instruction to help decrease symptoms of SOB with activities of daily living.    Expected Outcomes Short Term: Improve cardiorespiratory fitness to achieve a reduction of symptoms when performing ADLs;Long Term: Be able to perform more ADLs without symptoms or delay the onset of symptoms    Diabetes Yes    Intervention Provide education about signs/symptoms and action to take for  hypo/hyperglycemia.;Provide education about proper nutrition, including hydration, and aerobic/resistive exercise prescription along with prescribed medications to achieve blood glucose in normal ranges: Fasting glucose 65-99 mg/dL    Expected Outcomes Short Term: Participant verbalizes understanding of the signs/symptoms and immediate care of hyper/hypoglycemia, proper foot care and importance of medication, aerobic/resistive exercise and nutrition plan for blood glucose control.;Long Term: Attainment of HbA1C < 7%.    Hypertension Yes    Intervention Provide education on lifestyle modifcations including regular physical activity/exercise, weight management, moderate sodium restriction and increased consumption of fresh fruit, vegetables, and low fat dairy, alcohol moderation, and smoking cessation.;Monitor prescription use compliance.    Expected Outcomes Short Term: Continued assessment and intervention until BP is < 140/73mm HG in hypertensive participants. < 130/85mm HG in hypertensive participants with diabetes, heart failure or chronic kidney disease.;Long Term: Maintenance of blood pressure at goal levels.    Lipids Yes    Intervention Provide education and support for participant on nutrition & aerobic/resistive exercise along with prescribed medications to achieve LDL 70mg , HDL >40mg .    Expected Outcomes Short Term: Participant states understanding of desired cholesterol values  and is compliant with medications prescribed. Participant is following exercise prescription and nutrition guidelines.;Long Term: Cholesterol controlled with medications as prescribed, with individualized exercise RX and with personalized nutrition plan. Value goals: LDL < 70mg , HDL > 40 mg.             Education:Diabetes - Individual verbal and written instruction to review signs/symptoms of diabetes, desired ranges of glucose level fasting, after meals and with exercise. Acknowledge that pre and post exercise  glucose checks will be done for 3 sessions at entry of program. Flowsheet Row Pulmonary Rehab from 12/07/2022 in [facility] Cardiac [facility] Pulmonary Re[facility]  Date 10/18/22  Educator [facility]  Instruction Review Code 1- Verbalizes Understanding       Know Your Numbers and Heart Failure: - Group verbal and visual instruction to discuss disease risk factors for cardiac and pulmonary disease and treatment options.  Reviews associated critical values for Overweight/Obesity, Hypertension, Cholesterol, and Diabetes.  Discusses basics of heart failure: signs/symptoms and treatments.  Introduces Heart Failure Zone chart for action plan for heart failure.  Written material given at graduation. Flowsheet Row Pulmonary Rehab from 12/07/2022 in [facility] Cardiac and Pulmonary Rehab  Date 10/26/22  Educator SB  Instruction Review Code 1- Verbalizes Understanding       Core Components/Risk Factors/Patient Goals Review:   Goals and Risk Factor Review     Row Name 11/09/22 0930 11/30/22 0948 01/02/23 0941         Core Components/Risk Factors/Patient Goals Review   Personal Goals Review Improve shortness of breath with ADL's Improve shortness of breath with ADL's;Diabetes;Weight Management/Obesity Improve shortness of breath with ADL's;Diabetes;Weight Management/Obesity     Review Spoke to patient about their shortness of breath and what they can do to improve. Patient has been informed of breathing techniques when starting the program. Patient is informed to tell staff if they have had any med changes and that certain meds they are taking or not taking can be causing shortness of breath. Orvilla Fus states that he would still like to lose some weight. He came in today weighing 276 lbs, and would like to lose down to 250 lb. He has stayed busy with taking care of his wife after her surgery and has not been checking his blood sugar levels. He states that he does have a BS monitor, and will return to checking it routinely once his wife  is feeling better. He recently saw his pulmonologist who encouraged him to continue diet and exercise to help with SOB. He reports he is losing some weight but not as much as he would like. did dicusss what steady progress looks like. Spoke about holiday season and his plan. He likes sweets and knows he will need to be mindful of them. With his wife recovering from shoulder surgery he has taken on new skills like cooking. He hasnt been checking his blood sugars as much. He has a monitor, set goal today to get back into routine of using the monitor. He feels he is mkaing progress with stamina and SOB. encouraged him to continue to work on strenght and breathing techiques her at pulm rehab     Expected Outcomes Short: Attend [facility] regularly to improve shortness of breath with ADL's. Long: maintain independence with ADL's Short: Continue to work towards weight goal with diet and exercise. Long: Continue to manage lifestyle risk factors. Short: get back into routine of checking BS. Long: continue to manage lifestyle risk factors  Core Components/Risk Factors/Patient Goals at Discharge (Final Review):   Goals and Risk Factor Review - 01/02/23 0941       Core Components/Risk Factors/Patient Goals Review   Personal Goals Review Improve shortness of breath with ADL's;Diabetes;Weight Management/Obesity    Review He reports he is losing some weight but not as much as he would like. did dicusss what steady progress looks like. Spoke about holiday season and his plan. He likes sweets and knows he will need to be mindful of them. With his wife recovering from shoulder surgery he has taken on new skills like cooking. He hasnt been checking his blood sugars as much. He has a monitor, set goal today to get back into routine of using the monitor. He feels he is mkaing progress with stamina and SOB. encouraged him to continue to work on strenght and breathing techiques her at pulm rehab    Expected  Outcomes Short: get back into routine of checking BS. Long: continue to manage lifestyle risk factors             ITP Comments:  ITP Comments     Row Name 10/09/22 1024 10/18/22 1233 10/19/22 1007 10/25/22 1233 11/22/22 0848   ITP Comments Virtual Visit completed. Patient informed on EP and RD appointment and 6 Minute walk test. Patient also informed of patient health questionnaires on My Chart. Patient Verbalizes understanding. Visit diagnosis can be found in [facility] 09/18/2022. Completed and gym orientation. Initial ITP created and sent for review to Dr. Jinny Sanders, Medical Director. First full day of exercise!  Patient was oriented to gym and equipment including functions, settings, policies, and procedures.  Patient's individual exercise prescription and treatment plan were reviewed.  All starting workloads were established based on the results of the 6 minute walk test done at initial orientation visit.  The plan for exercise progression was also introduced and progression will be customized based on patient's performance and goals. 30 Day review completed. Medical Director ITP review done, changes made as directed, and signed approval by Medical Director.    new to program 30 Day review completed. Medical Director ITP review done, changes made as directed, and signed approval by Medical Director.    Row Name 12/13/22 0923 01/10/23 1141         ITP Comments 30 Day review completed. Medical Director ITP review done, changes made as directed, and signed approval by Medical Director. 30 Day review completed. Medical Director ITP review done, changes made as directed, and signed approval by Medical Director.               Comments:

## 2023-01-10 NOTE — Patient Outreach (Signed)
Care Coordination   Follow Up Visit Note   01/10/2023 Name: Nicholas [name] MRN: [id number] DOB: [birth year]  Nicholas [name] is a 77 y.o. year old male who sees Cannady, Corrie Dandy T, NP for primary care. I spoke with  Nicholas [name] by phone today.  What matters to the patients health and wellness today?  Report he is doing well, feels better, increased energy since starting pulmonary/cardiac rehab.  Looking forward to completing sessions.      Goals Addressed             This Visit's Progress    COMPLETED: RNCM (HTN) EXPECTED OUTCOME: MONITOR, SELF-MANAGE AND REDUCE SYMPTOMS OF HTN   On track    Current Barriers:  Knowledge Deficits related to benefits of taking blood pressures on a regular basis and normal values  Chronic Disease Management support and education needs related to effective management of HTN   BP Readings from Last 3 Encounters:  01/01/23 103/63  12/20/22 115/70  12/12/22 114/75     Planned Interventions: Evaluation of current treatment plan related to hypertension self management and patient's adherence to plan as established by provider. The patient denies any issues with HTN or heart health. States he is having shortness of breath and no energy. The patient has been to [facility] and states it did not go as he planned. He wanted to have surgery and recover but the doctor wanted him to to do some rehab and have his pacemaker checked. He states that he can tell that the rehab his helping. He has been 3 times already. He goes back to the specialist in November.    Provided education to patient re: stroke prevention, s/s of heart attack and stroke; Reviewed prescribed diet heart healthy/ADA diet. The patient is compliant with a heart healthy/ADA diet. Denies any needs related to dietary restrictions Reviewed medications with patient and discussed importance of compliance. Patient states compliance with medications. Works with pharm D for ongoing support and education.    Discussed  plans with patient for ongoing care management follow up and provided patient with direct contact information for care management team; Advised patient, providing education and rationale, to monitor blood pressure daily and record, calling PCP for findings outside established parameters. The patient states that his blood pressures are higher now. He has not had to take the medications to raise his blood pressures. Education on systolic goal of <829 and diastolic of <90. Denies any issues with hypotension at this time. Has had to take medications in the past Reviewed scheduled/upcoming provider appointments including: saw the pcp in September. Is seeing specialist and taking pulmonary/Cardiac rehab.  Advised patient to discuss changes in blood pressures or heart health with provider; Provided education on prescribed diet heart healthy/ADA diet;  Discussed complications of poorly controlled blood pressure such as heart disease, stroke, circulatory complications, vision complications, kidney impairment, sexual dysfunction;  Screening for signs and symptoms of depression related to chronic disease state;  Assessed social determinant of health barriers;   Symptom Management: Take medications as prescribed   Attend all scheduled provider appointments Perform all self care activities independently  Call provider office for new concerns or questions  call the Suicide and Crisis Lifeline: 988 call the [place] National Suicide Prevention Lifeline: (352)809-1892 or TTY: 2283928543 TTY 3041449152) to talk to a trained counselor call 1-800-273-TALK (toll free, 24 hour hotline) if experiencing a Mental Health or Behavioral Health Crisis  check blood pressure weekly learn about high blood pressure keep a  blood pressure log take blood pressure log to all doctor appointments call doctor for signs and symptoms of high blood pressure develop an action plan for high blood pressure keep all doctor  appointments take medications for blood pressure exactly as prescribed report new symptoms to your doctor  Follow Up Plan: Telephone follow up appointment with care management team member scheduled for: 01-10-2023 at 230 pm       COMPLETED: RNCM Care Management Expected Outcome:  Monitor, Self-Manage and Reduce Symptoms of Afib   On track    Current Barriers:  Knowledge Deficits related to how to tell if his heart rate is out of rhythm  with condition of AFIB Chronic Disease Management support and education needs related to effective management of AFIB  Planned Interventions: Provider order and care plan reviewed. Collaborated with PharmD regarding patient care and plan. Takes Eliquis 5mg  BID. Works with pharm D for ongoing support and education. The patient states he is having a hard time paying for his Eliquis. Is receptive to talking to the pharm D for help with financial assistance with medication cost. Works with the pharm D on a regular basis.  Counseled on increased risk of stroke due to Afib and benefits of anticoagulation for stroke prevention. Review of sx and sx and the patient states he cannot tell when he is going into AFIB. Most recent pacemaker record indicated in the last 90 days he had one episode of AFIB that last 90 minutes. Review of sx and sx and the patient is having dizziness, increased fatigue and shortness of breath. When he saw the specialist at [facility] they did a 6 minute walk test and the provider states that his heart rate stayed in the 90's and it should have been over 100. The provider ask him to have his pacemaker checked. He is working on this and doing [employer]. He is telling a positive difference.         Reviewed importance of adherence to anticoagulant exactly as prescribed. The patient is compliant with medications. Is compliant with medications. Is willing to talk to the pharm D for medications assistance for Eliquis cost constraints.  Advised  patient to discuss changes in heart rate, changes in activity level, acute onset of shortness of breath, or other questions and concerns related to AFIB with provider Counseled on bleeding risk associated with AFIB and importance of self-monitoring for signs/symptoms of bleeding Counseled on avoidance of NSAIDs due to increased bleeding risk with anticoagulants Counseled on importance of regular laboratory monitoring as prescribed. The patient has lab work as requested by providers.  Counseled on seeking medical attention after a head injury or if there is blood in the urine/stool Afib action plan reviewed. The patient goes to [facility] on 09-18-2022 and will see several providers. The patient and his wife were asking questions and the RNCM used EBP tool to educate the patient and his wife on what a 2nd degree heart block, Wenckebach is. The patient has been having sx and sx of dizziness, shortness of breath, and fatigue. He hopes Monday will offer answers to why he is feeling the way he is. He denies any acute distress today. Reflective listening and support given. Will continue to monitor.  Screening for signs and symptoms of depression related to chronic disease state Assessed social determinant of health barriers  Symptom Management: Take medications as prescribed   Attend all scheduled provider appointments Call provider office for new concerns or questions  call the Suicide and Crisis  Lifeline: 988 call the [place] National Suicide Prevention Lifeline: 206-466-6282 or TTY: (364)562-6035 TTY 7018497898) to talk to a trained counselor call 1-800-273-TALK (toll free, 24 hour hotline) if experiencing a Mental Health or Behavioral Health Crisis  - make a plan to eat healthy - keep all lab appointments - take medicine as prescribed  Follow Up Plan: Telephone follow up appointment with care management team member scheduled for: 01-10-2023 at 230 pm       COMPLETED: RNCM Care Management:  Expected  Outcome:  Monitor, Self-Manage and Reduce Symptoms of Diabetes   On track    Current Barriers:  Knowledge Deficits related to monitoring for changes in blood sugars and the effect of elevated blood sugars on other body systems Chronic Disease Management support and education needs related to effective management of DM Lab Results  Component Value Date   HGBA1C 6.3 (H) 01/01/2023     Planned Interventions: Provided education to patient about basic DM disease process. The patient with slightly elevated A1C level, has stayed the same the last 6 months. The patient is mindful of changes. He feels overall he is doing well except for the shortness of breath and low energy level. He is being proactive with appointments and follow up with specialist. Education and support given. ; Reviewed medications with patient and discussed importance of medication adherence. The patient is compliant with medications. Did not tolerate Ozempic. Is currently controlling DM with diet.         Reviewed prescribed diet with patient heart healthy/ADA diet. Patient is mindful of dietary restrictions. The patient denies any issues with dietary restrictions ; Counseled on importance of regular laboratory monitoring as prescribed. Has regular lab testing;        Discussed plans with patient for ongoing care management follow up and provided patient with direct contact information for care management team;      Provided patient with written educational materials related to hypo and hyperglycemia and importance of correct treatment;       Reviewed scheduled/upcoming provider appointments including: Saw pcp in September. Has several specialist appointments coming up.       Advised patient, providing education and rationale, to check cbg when you have symptoms of low or high blood sugar and as directed   and record. The patient has not been checking his blood sugars.        Referral made to pharmacy team for assistance with  medication reconciliation, education and support. Works with pharm D on a consistent basis;       Review of patient status, including review of consultants reports, relevant laboratory and other test results, and medications completed;       Advised patient to discuss changes in DM health and well being with provider;      Screening for signs and symptoms of depression related to chronic disease state;        Assessed social determinant of health barriers;       Eye exam completed today (02-09-2022). Denies any concerns with vision and states he received a good report.   The patient saw the pcp on 10-23-2022 and has a UTI. He is taking antibiotics for the UTI. The patient states he will follow up with the pcp if he is not any better. Education provided. Will continue to monitor.   Symptom Management: Take medications as prescribed   Attend all scheduled provider appointments Call provider office for new concerns or questions  call the Suicide and Crisis Lifeline: 988  call the [place] National Suicide Prevention Lifeline: 917-312-4987 or TTY: (267)550-5974 TTY 714-561-7965) to talk to a trained counselor call 1-800-273-TALK (toll free, 24 hour hotline) if experiencing a Mental Health or Behavioral Health Crisis  check feet daily for cuts, sores or redness trim toenails straight across manage portion size wash and dry feet carefully every day wear comfortable, cotton socks wear comfortable, well-fitting shoes  Follow Up Plan: Telephone follow up appointment with care management team member scheduled for: 01-10-2023 at 230 pm          SDOH assessments and interventions completed:  No     Care Coordination Interventions:  Yes, provided   Interventions Today    Flowsheet Row Most Recent Value  Chronic Disease   Chronic disease during today's visit Atrial Fibrillation (AFib), Hypertension (HTN), Diabetes  General Interventions   General Interventions Discussed/Reviewed General  Interventions Reviewed, Doctor Visits  Doctor Visits Discussed/Reviewed Doctor Visits Reviewed, PCP, Specialist  [has device check tomorrow, cardiology 1/24, PCP 3/10, and multiple visit with cardiac rehab]  PCP/Specialist Visits Compliance with follow-up visit  Exercise Interventions   Exercise Discussed/Reviewed Weight Managment  Weight Management Weight maintenance  Education Interventions   Education Provided Provided Education  Provided Verbal Education On Medication, Blood Sugar Monitoring, When to see the doctor, Other, Labs, Nutrition  [Encouraged to continue daily monitoring of HR/BP as well as blood sugars, report all are stable.]  Labs Reviewed Hgb A1c  [decreased to 6.3, remains below goal]  Nutrition Interventions   Nutrition Discussed/Reviewed Nutrition Reviewed, Adding fruits and vegetables, Decreasing salt, Decreasing sugar intake  [Report he has changed his diet to better manager hearth health and DM]       Follow up plan: Follow up call scheduled for 3/12    Encounter Outcome:  Patient Visit Completed   Rodney Langton, RN, MSN, CCM [place]  [facility] RN Care Coordinator Direct Dial: [phone] / Main [phone] Fax [phone] Email: [email or web address]: [email or web address]

## 2023-01-11 ENCOUNTER — Ambulatory Visit (INDEPENDENT_AMBULATORY_CARE_PROVIDER_SITE_OTHER): Payer: Medicare Other

## 2023-01-11 ENCOUNTER — Encounter: Payer: Medicare Other | Admitting: *Deleted

## 2023-01-11 DIAGNOSIS — R0609 Other forms of dyspnea: Secondary | ICD-10-CM

## 2023-01-11 DIAGNOSIS — I442 Atrioventricular block, complete: Secondary | ICD-10-CM

## 2023-01-11 NOTE — Progress Notes (Signed)
Daily Session Note  Patient Details  Name: Nicholas [name] MRN: [id number] Date of Birth: 1945-07-01 Referring Provider:   Flowsheet Row Pulmonary Rehab from 10/18/2022 in [facility] Cardiac and Pulmonary Rehab  Referring Provider Dr. Birdena Jubilee       Encounter Date: 01/11/2023  Check In:  Session Check In - 01/11/23 0927       Check-In   Supervising physician immediately available to respond to emergencies See telemetry face sheet for immediately available ER MD    Location [facility]-Cardiac & Pulmonary Rehab    Staff Present Ronette Deter, BS, Exercise Physiologist;Joseph Reino Kent, Guinevere Ferrari, RN, ADN;[employer], , Exercise Physiologist    Virtual Visit No    Medication changes reported     No    Fall or balance concerns reported    No    Warm-up and Cool-down Performed on first and last piece of equipment    Resistance Training Performed Yes    VAD Patient? No    PAD/SET Patient? No      Pain Assessment   Currently in Pain? No/denies                Social History   Tobacco Use  Smoking Status Never  Smokeless Tobacco Never    Goals Met:  Independence with exercise equipment Exercise tolerated well No report of concerns or symptoms today Strength training completed today  Goals Unmet:  Not Applicable  Comments: Pt able to follow exercise prescription today without complaint.  Will continue to monitor for progression.    Dr. Bethann Punches is Medical Director for [facility] Cardiac Rehabilitation.  Dr. Vida Rigger is Medical Director for [facility] Pulmonary Rehabilitation.

## 2023-01-12 ENCOUNTER — Ambulatory Visit (INDEPENDENT_AMBULATORY_CARE_PROVIDER_SITE_OTHER): Payer: Medicare Other | Admitting: Nurse Practitioner

## 2023-01-12 ENCOUNTER — Encounter: Payer: Self-pay | Admitting: Nurse Practitioner

## 2023-01-12 VITALS — BP 136/81 | HR 77 | Temp 97.1°F | Ht 70.0 in | Wt 276.4 lb

## 2023-01-12 DIAGNOSIS — N3090 Cystitis, unspecified without hematuria: Secondary | ICD-10-CM | POA: Diagnosis not present

## 2023-01-12 DIAGNOSIS — N3001 Acute cystitis with hematuria: Secondary | ICD-10-CM

## 2023-01-12 LAB — CUP PACEART REMOTE DEVICE CHECK
Battery Remaining Longevity: 4 mo
Battery Voltage: 2.87 V
Brady Statistic AP VP Percent: 37.77 %
Brady Statistic AP VS Percent: 0.01 %
Brady Statistic AS VP Percent: 62.17 %
Brady Statistic AS VS Percent: 0.05 %
Brady Statistic RA Percent Paced: 37.73 %
Brady Statistic RV Percent Paced: 99.89 %
Date Time Interrogation Session: 20241219084410
Implantable Lead Connection Status: 753985
Implantable Lead Connection Status: 753985
Implantable Lead Implant Date: 20180124
Implantable Lead Implant Date: 20180124
Implantable Lead Location: 753859
Implantable Lead Location: 753860
Implantable Lead Model: 3830
Implantable Lead Model: 5076
Implantable Pulse Generator Implant Date: 20180124
Lead Channel Impedance Value: 323 Ohm
Lead Channel Impedance Value: 342 Ohm
Lead Channel Impedance Value: 361 Ohm
Lead Channel Impedance Value: 437 Ohm
Lead Channel Pacing Threshold Amplitude: 0.5 V
Lead Channel Pacing Threshold Amplitude: 0.75 V
Lead Channel Pacing Threshold Pulse Width: 0.4 ms
Lead Channel Pacing Threshold Pulse Width: 0.4 ms
Lead Channel Sensing Intrinsic Amplitude: 1.625 mV
Lead Channel Sensing Intrinsic Amplitude: 1.625 mV
Lead Channel Sensing Intrinsic Amplitude: 3.25 mV
Lead Channel Sensing Intrinsic Amplitude: 3.25 mV
Lead Channel Setting Pacing Amplitude: 2 V
Lead Channel Setting Pacing Amplitude: 4 V
Lead Channel Setting Pacing Pulse Width: 1 ms
Lead Channel Setting Sensing Sensitivity: 0.6 mV
Zone Setting Status: 755011
Zone Setting Status: 755011

## 2023-01-12 LAB — URINALYSIS, ROUTINE W REFLEX MICROSCOPIC
Bilirubin, UA: NEGATIVE
Glucose, UA: NEGATIVE
Ketones, UA: NEGATIVE
Nitrite, UA: POSITIVE — AB
Specific Gravity, UA: 1.025 (ref 1.005–1.030)
Urobilinogen, Ur: 1 mg/dL (ref 0.2–1.0)
pH, UA: 6 (ref 5.0–7.5)

## 2023-01-12 LAB — MICROSCOPIC EXAMINATION

## 2023-01-12 MED ORDER — SULFAMETHOXAZOLE-TRIMETHOPRIM 800-160 MG PO TABS: 1.0000 | ORAL_TABLET | Freq: Two times a day (BID) | ORAL | 0 refills | Status: DC

## 2023-01-12 NOTE — Progress Notes (Signed)
BP 136/81 (BP Location: Left Arm, Patient Position: Sitting, Cuff Size: Large)   Pulse 77   Temp (!) 97.1 F (36.2 C) (Oral)   Ht 5\' 10"  (1.778 m)   Wt 276 lb 6.4 oz (125.4 kg)   SpO2 93%   BMI 39.66 kg/m    Subjective:    Patient ID: Nicholas [name], male    DOB: 21-Sep-1945, 77 y.o.   MRN: [id number]  HPI: [name] is a 77 y.o. male  Chief Complaint  Patient presents with   Cystitis    Symptoms started around 1-2 weeks ago, symptoms are frequent urination and pain when going    URINARY SYMPTOMS Patient states he has been having symptoms for 1-2 weeks.   Dysuria: yes Urinary frequency: yes Urgency: yes Small volume voids: yes Symptom severity: no Urinary incontinence: no Foul odor: yes Hematuria: no Abdominal pain: yes Back pain: no Suprapubic pain/pressure: no Flank pain: no Fever:  no Vomiting: no Relief with cranberry juice: taking cranberry pill Relief with pyridium: no Status: worse Previous urinary tract infection: yes Recurrent urinary tract infection: yes Sexual activity: No sexually active/monogomous/practicing safe sex   Relevant past medical, surgical, family and social history reviewed and updated as indicated. Interim medical history since our last visit reviewed. Allergies and medications reviewed and updated.  Review of Systems  Constitutional:  Negative for fatigue.  Gastrointestinal:  Positive for abdominal pain.  Genitourinary:  Positive for decreased urine volume, dysuria and urgency. Negative for flank pain, frequency and hematuria.  Musculoskeletal:  Negative for back pain.    Per HPI unless specifically indicated above     Objective:    BP 136/81 (BP Location: Left Arm, Patient Position: Sitting, Cuff Size: Large)   Pulse 77   Temp (!) 97.1 F (36.2 C) (Oral)   Ht 5\' 10"  (1.778 m)   Wt 276 lb 6.4 oz (125.4 kg)   SpO2 93%   BMI 39.66 kg/m   Wt Readings from Last 3 Encounters:  01/12/23 276 lb 6.4 oz (125.4 kg)  01/01/23  274 lb 3.2 oz (124.4 kg)  12/20/22 275 lb 9.6 oz (125 kg)    Physical Exam Vitals and nursing note reviewed.  Constitutional:      General: He is not in acute distress.    Appearance: Normal appearance. He is not ill-appearing, toxic-appearing or diaphoretic.  HENT:     Head: Normocephalic.     Right Ear: External ear normal.     Left Ear: External ear normal.     Nose: Nose normal. No congestion or rhinorrhea.     Mouth/Throat:     Mouth: Mucous membranes are moist.  Eyes:     General:        Right eye: No discharge.        Left eye: No discharge.     Extraocular Movements: Extraocular movements intact.     Conjunctiva/sclera: Conjunctivae normal.     Pupils: Pupils are equal, round, and reactive to light.  Cardiovascular:     Rate and Rhythm: Normal rate and regular rhythm.     Heart sounds: No murmur heard. Pulmonary:     Effort: Pulmonary effort is normal. No respiratory distress.     Breath sounds: Normal breath sounds. No wheezing, rhonchi or rales.  Abdominal:     General: Abdomen is flat. Bowel sounds are normal. There is no distension.     Tenderness: There is abdominal tenderness. There is no right CVA tenderness, left CVA  tenderness or guarding.  Musculoskeletal:     Cervical back: Normal range of motion and neck supple.  Skin:    General: Skin is warm and dry.     Capillary Refill: Capillary refill takes less than 2 seconds.  Neurological:     General: No focal deficit present.     Mental Status: He is alert and oriented to person, place, and time.  Psychiatric:        Mood and Affect: Mood normal.        Behavior: Behavior normal.        Thought Content: Thought content normal.        Judgment: Judgment normal.     Results for orders placed or performed in visit on 01/01/23  Bayer DCA Hb A1c Waived   Collection Time: 01/01/23  9:03 AM  Result Value Ref Range   HB A1C (BAYER DCA - WAIVED) 6.3 (H) 4.8 - 5.6 %  Comprehensive metabolic panel   Collection  Time: 01/01/23  9:03 AM  Result Value Ref Range   Glucose 129 (H) 70 - 99 mg/dL   BUN 18 8 - 27 mg/dL   Creatinine, Ser 1.61 (H) 0.76 - 1.27 mg/dL   eGFR 55 (L) >09 UE/AVW/0.98   BUN/Creatinine Ratio 13 10 - 24   Sodium 140 134 - 144 mmol/L   Potassium 4.7 3.5 - 5.2 mmol/L   Chloride 101 96 - 106 mmol/L   CO2 27 20 - 29 mmol/L   Calcium 9.2 8.6 - 10.2 mg/dL   Total Protein 7.0 6.0 - 8.5 g/dL   Albumin 4.6 3.8 - 4.8 g/dL   Globulin, Total 2.4 1.5 - 4.5 g/dL   Bilirubin Total 0.5 0.0 - 1.2 mg/dL   Alkaline Phosphatase 63 44 - 121 IU/L   AST 33 0 - 40 IU/L   ALT 25 0 - 44 IU/L  Lipid Panel w/o Chol/HDL Ratio   Collection Time: 01/01/23  9:03 AM  Result Value Ref Range   Cholesterol, Total 131 100 - 199 mg/dL   Triglycerides 95 0 - 149 mg/dL   HDL 50 >11 mg/dL   VLDL Cholesterol Cal 18 5 - 40 mg/dL   LDL Chol Calc (NIH) 63 0 - 99 mg/dL      Assessment & Plan:   Problem List Items Addressed This Visit   None Visit Diagnoses       Acute cystitis with hematuria    -  Primary   Complete course of bactrim.  Has had several UTIs over recent months. Appointment made for patient to see urology.  Urine culture sent.   Relevant Orders   Urine Culture     Bladder infection       Relevant Medications   sulfamethoxazole-trimethoprim (BACTRIM DS) 800-160 MG tablet   Other Relevant Orders   Urinalysis, Routine w reflex microscopic        Follow up plan: Return if symptoms worsen or fail to improve.

## 2023-01-16 ENCOUNTER — Encounter: Payer: Medicare Other | Admitting: *Deleted

## 2023-01-16 DIAGNOSIS — R0609 Other forms of dyspnea: Secondary | ICD-10-CM

## 2023-01-16 NOTE — Progress Notes (Signed)
Daily Session Note  Patient Details  Name: Nicholas [name] MRN: [id number] Date of Birth: 1945-10-04 Referring Provider:   Flowsheet Row Pulmonary Rehab from 10/18/2022 in [facility] Cardiac and Pulmonary Rehab  Referring Provider Dr. Birdena Jubilee       Encounter Date: 01/16/2023  Check In:  Session Check In - 01/16/23 0929       Check-In   Supervising physician immediately available to respond to emergencies See telemetry face sheet for immediately available ER MD    Location [facility]-Cardiac & Pulmonary Rehab    Staff Present Cora Collum, RN, BSN, CCRP;Margaret Best, MS, Exercise Physiologist;Kristen Coble, RN,BC,MSN;Noah Tickle, BS, Exercise Physiologist;Jason Wallace Cullens, RDN, LDN    Virtual Visit No    Medication changes reported     No    Fall or balance concerns reported    No    Warm-up and Cool-down Performed on first and last piece of equipment    Resistance Training Performed Yes    VAD Patient? No    PAD/SET Patient? No      Pain Assessment   Currently in Pain? No/denies                Social History   Tobacco Use  Smoking Status Never  Smokeless Tobacco Never    Goals Met:  Proper associated with RPD/PD & O2 Sat Independence with exercise equipment Exercise tolerated well No report of concerns or symptoms today  Goals Unmet:  Not Applicable  Comments: Pt able to follow exercise prescription today without complaint.  Will continue to monitor for progression.    Dr. Bethann Punches is Medical Director for [facility] Cardiac Rehabilitation.  Dr. Vida Rigger is Medical Director for [facility] Pulmonary Rehabilitation.

## 2023-01-17 LAB — URINE CULTURE

## 2023-01-18 ENCOUNTER — Encounter: Payer: Medicare Other | Admitting: *Deleted

## 2023-01-18 DIAGNOSIS — R0609 Other forms of dyspnea: Secondary | ICD-10-CM | POA: Diagnosis not present

## 2023-01-18 NOTE — Progress Notes (Signed)
Daily Session Note  Patient Details  Name: Nicholas [name] MRN: [id number] Date of Birth: 18-Jul-1945 Referring Provider:   Flowsheet Row Pulmonary Rehab from 10/18/2022 in [facility] Cardiac and Pulmonary Rehab  Referring Provider Dr. Birdena Jubilee       Encounter Date: 01/18/2023  Check In:  Session Check In - 01/18/23 0928       Check-In   Supervising physician immediately available to respond to emergencies See telemetry face sheet for immediately available ER MD    Location [facility]-Cardiac & Pulmonary Rehab    Staff Present Cora Collum, RN, BSN, CCRP;Joseph McGehee, Guinevere Ferrari, RN, [place]    Virtual Visit No    Medication changes reported     No    Fall or balance concerns reported    No    Warm-up and Cool-down Performed on first and last piece of equipment    Resistance Training Performed Yes    VAD Patient? No    PAD/SET Patient? No      Pain Assessment   Currently in Pain? No/denies                Social History   Tobacco Use  Smoking Status Never  Smokeless Tobacco Never    Goals Met:  Independence with exercise equipment Exercise tolerated well No report of concerns or symptoms today Strength training completed today  Goals Unmet:  Not Applicable  Comments: Pt able to follow exercise prescription today without complaint.  Will continue to monitor for progression.    Dr. Bethann Punches is Medical Director for [facility] Cardiac Rehabilitation.  Dr. Vida Rigger is Medical Director for [facility] Pulmonary Rehabilitation.

## 2023-01-23 ENCOUNTER — Encounter: Payer: Medicare Other | Admitting: *Deleted

## 2023-01-23 DIAGNOSIS — R0609 Other forms of dyspnea: Secondary | ICD-10-CM

## 2023-01-23 NOTE — Progress Notes (Signed)
 Daily Session Note  Patient Details  Name: Nicholas [name] MRN: [id number] Date of Birth: [birth year] Referring Provider:   Flowsheet Row Pulmonary Rehab from 10/18/2022 in [facility] Cardiac and Pulmonary Rehab  Referring Provider Dr. Bertrand Rosales       Encounter Date: 01/23/2023  Check In:  Session Check In - 01/23/23 0932       Check-In   Supervising physician immediately available to respond to emergencies See telemetry face sheet for immediately available ER MD    Location [facility]-Cardiac & Pulmonary Rehab    Staff Present Othel Durand, RN, BSN, CCRP;Margaret Best, MS, Exercise Physiologist;Maxon Conetta BS, , Exercise Physiologist    Virtual Visit No    Medication changes reported     No    Fall or balance concerns reported    No    Warm-up and Cool-down Performed on first and last piece of equipment    Resistance Training Performed Yes    VAD Patient? No    PAD/SET Patient? No      Pain Assessment   Currently in Pain? No/denies                Social History   Tobacco Use  Smoking Status Never  Smokeless Tobacco Never    Goals Met:  Proper associated with RPD/PD & O2 Sat Independence with exercise equipment Exercise tolerated well No report of concerns or symptoms today  Goals Unmet:  Not Applicable  Comments: Pt able to follow exercise prescription today without complaint.  Will continue to monitor for progression.    Dr. Oneil Pinal is Medical Director for [facility] Cardiac Rehabilitation.  Dr. Fuad Aleskerov is Medical Director for [facility] Pulmonary Rehabilitation.

## 2023-01-25 ENCOUNTER — Encounter: Payer: Medicare Other | Attending: Internal Medicine | Admitting: *Deleted

## 2023-01-25 DIAGNOSIS — I2724 Chronic thromboembolic pulmonary hypertension: Secondary | ICD-10-CM | POA: Diagnosis present

## 2023-01-25 DIAGNOSIS — R0609 Other forms of dyspnea: Secondary | ICD-10-CM | POA: Insufficient documentation

## 2023-01-25 NOTE — Progress Notes (Signed)
 Daily Session Note  Patient Details  Name: Nicholas [name] MRN: [id number] Date of Birth: 1945-08-02 Referring Provider:   Flowsheet Row Pulmonary Rehab from 10/18/2022 in [facility] Cardiac and Pulmonary Rehab  Referring Provider Dr. Bertrand Rosales       Encounter Date: 01/25/2023  Check In:  Session Check In - 01/25/23 0928       Check-In   Supervising physician immediately available to respond to emergencies See telemetry face sheet for immediately available ER MD    Location [facility]-Cardiac & Pulmonary Rehab    Staff Present Othel Durand, RN, BSN, CCRP;Margaret Best, MS, Exercise Physiologist;Maxon Conetta BS, , Exercise Physiologist;Perkins Molina Claudene, RN, ADN    Virtual Visit No    Medication changes reported     No    Fall or balance concerns reported    No    Warm-up and Cool-down Performed on first and last piece of equipment    Resistance Training Performed Yes    VAD Patient? No    PAD/SET Patient? No      Pain Assessment   Currently in Pain? No/denies                Social History   Tobacco Use  Smoking Status Never  Smokeless Tobacco Never    Goals Met:  Independence with exercise equipment Exercise tolerated well No report of concerns or symptoms today Strength training completed today  Goals Unmet:  Not Applicable  Comments: Pt able to follow exercise prescription today without complaint.  Will continue to monitor for progression.    Dr. Oneil Pinal is Medical Director for [facility] Cardiac Rehabilitation.  Dr. Fuad Aleskerov is Medical Director for [facility] Pulmonary Rehabilitation.

## 2023-01-29 ENCOUNTER — Ambulatory Visit: Payer: Medicare Other | Admitting: Physician Assistant

## 2023-01-29 VITALS — BP 139/80 | HR 73

## 2023-01-29 DIAGNOSIS — N390 Urinary tract infection, site not specified: Secondary | ICD-10-CM

## 2023-01-29 LAB — BLADDER SCAN AMB NON-IMAGING: Scan Result: 95

## 2023-01-29 LAB — MICROSCOPIC EXAMINATION: RBC, Urine: NONE SEEN /[HPF] (ref 0–2)

## 2023-01-29 LAB — URINALYSIS, COMPLETE
Bilirubin, UA: NEGATIVE
Ketones, UA: NEGATIVE
Leukocytes,UA: NEGATIVE
Nitrite, UA: NEGATIVE
Protein,UA: NEGATIVE
RBC, UA: NEGATIVE
Specific Gravity, UA: 1.015 (ref 1.005–1.030)
Urobilinogen, Ur: 0.2 mg/dL (ref 0.2–1.0)
pH, UA: 5.5 (ref 5.0–7.5)

## 2023-01-29 MED ORDER — TRIMETHOPRIM 100 MG PO TABS: 100.0000 mg | ORAL_TABLET | Freq: Every day | ORAL | 3 refills | Status: DC

## 2023-01-29 NOTE — Progress Notes (Signed)
 01/29/2023 3:16 PM   Nicholas [name] 03-10-1945 [id number]  CC: Chief Complaint  Patient presents with   Recurrent UTI   HPI: NIV DARLEY is a 78 y.o. male with PMH DM2 on Jardiance , OAB, elevated PSA, BXO s/p circumcision, and urethral stricture s/p DVIU in 2015 who presents today for evaluation of recurrent UTI.  He is accompanied today by his wife, who contributes to HPI.  Today he reports a history of occasional UTI that became much more frequent about 4 months ago in the setting of starting Jardiance .  Symptoms typically include dysuria, frequency, and malodorous urine.  He feels the symptoms recur within 1 to 2 weeks of stopping antibiotics.  He denies gross hematuria.  He is asymptomatic today.  He has not noticed a change in his voiding stream. He does notice increased frequency when he takes Jardiance .  Urine culture history as follows: 10/23/2022: Pansensitive E. coli 12/04/2022: Mixed urogenital flora 12/20/2022: Pansensitive E. coli 12/29/2022: Mixed urogenital flora 01/12/2023: Pansensitive E. coli  In-office UA today positive for 3+ glucose; urine microscopy pan negative. PVR 95mL.  PMH: Past Medical History:  Diagnosis Date   Abdominal pain, chronic, right lower quadrant 12/13/2020   Anterior urethral stricture    Anxiety    Arthritis    a. knees, hips, hands;  b. 11/2013 s/p L TKA @ [facility].   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 Dr Fernande   Depression    DVT (deep venous thrombosis) (HCC)    Erosive esophagitis    Gross hematuria    Hyperlipemia    Hypertension    borderline   Internal hemorrhoids    Phimosis    Pulmonary embolism (HCC)     Surgical History: Past Surgical History:  Procedure Laterality Date   ATRIAL FIBRILLATION ABLATION N/A 05/17/2020   Procedure: ATRIAL FIBRILLATION ABLATION;  Surgeon: Cindie Ole ONEIDA, MD;  [facility]: [facility] INVASIVE CV LAB;  Service: Cardiovascular;  Laterality: N/A;   BUNIONECTOMY Bilateral 01/06/2015   Procedure: ROMAYNE;  Surgeon: Kayla Pinal, MD;  Location: [facility] ORS;  Service: Orthopedics;  Laterality: Bilateral;   CARDIAC CATHETERIZATION  ~ 2005   once   CATARACT EXTRACTION W/ INTRAOCULAR LENS  IMPLANT, BILATERAL Bilateral ~ 2010   COLONOSCOPY WITH PROPOFOL  N/A 12/07/2015   Procedure: COLONOSCOPY WITH PROPOFOL ;  Surgeon: Gladis RAYMOND Mariner, MD;  Location: [facility] ENDOSCOPY;  Service: Endoscopy;  Laterality: N/A;   COLONOSCOPY WITH PROPOFOL  N/A 11/26/2020   Procedure: COLONOSCOPY WITH PROPOFOL ;  Surgeon: Unk Corinn Skiff, MD;  Location: [facility] ENDOSCOPY;  Service: Gastroenterology;  Laterality: N/A;   EP IMPLANTABLE DEVICE N/A 02/16/2016   MDT dual chamber (His Bundle) pacemaker implanted by Dr Fernande for intermittent complete heart block   ESOPHAGOGASTRODUODENOSCOPY (EGD) WITH PROPOFOL  N/A 12/07/2015   Procedure: ESOPHAGOGASTRODUODENOSCOPY (EGD) WITH PROPOFOL ;  Surgeon: Gladis RAYMOND Mariner, MD;  Location: [facility] ENDOSCOPY;  Service: Endoscopy;  Laterality: N/A;   ESOPHAGOGASTRODUODENOSCOPY (EGD) WITH PROPOFOL  N/A 05/17/2017   Procedure: ESOPHAGOGASTRODUODENOSCOPY (EGD) WITH PROPOFOL ;  Surgeon: Mariner Gladis RAYMOND, MD;  Location: [facility] ENDOSCOPY;  Service: Endoscopy;  Laterality: N/A;   HAMMER TOE SURGERY Bilateral 01/06/2015   Procedure: HAMMER TOE CORRECTION;  Surgeon: Kayla Pinal, MD;  Location: [facility] ORS;  Service: Orthopedics;  Laterality: Bilateral;   KNEE CARTILAGE SURGERY Left 1965   football injury   Left Total Knee  Arthroplasty     a. 11/2013 [facility].   PACEMAKER IMPLANT  2017   PILONIDAL CYST EXCISION  1970's   TOTAL HIP ARTHROPLASTY Right 2004   TOTAL HIP ARTHROPLASTY Left 2006   UPPER GI ENDOSCOPY      Home Medications:  Allergies as of 01/29/2023   No Known Allergies      Medication List        Accurate as of January 29, 2023  3:16 PM. If  you have any questions, ask your nurse or doctor.          STOP taking these medications    sulfamethoxazole -trimethoprim  800-160 MG tablet Commonly known as: BACTRIM  DS       TAKE these medications    acetaminophen  650 MG CR tablet Commonly known as: TYLENOL  Take 650 mg by mouth every 8 (eight) hours as needed for pain.   albuterol  108 (90 Base) MCG/ACT inhaler Commonly known as: VENTOLIN  HFA INHALE 2 PUFFS BY MOUTH EVERY 6 HOURS AS NEEDED   apixaban  5 MG Tabs tablet Commonly known as: Eliquis  Take 1 tablet (5 mg total) by mouth 2 (two) times daily.   calcium -vitamin D  500-5 MG-MCG tablet Commonly known as: OSCAL WITH D Take 1 tablet by mouth daily with breakfast.   Cranberry 500 MG Caps Take 500 mg by mouth daily.   empagliflozin  25 MG Tabs tablet Commonly known as: Jardiance  Take 1 tablet (25 mg total) by mouth daily.   Fish Oil 1200 MG Caps Take 1,200 mg by mouth daily.   gabapentin  600 MG tablet Commonly known as: NEURONTIN  TAKE 1 TABLET BY MOUTH EVERYDAY AT BEDTIME   glucose blood test strip Use to check blood sugar 3 times a day and document results, bring to appointments.  Goal is <130 fasting blood sugar and <180 two hours after meals.   LORazepam  1 MG tablet Commonly known as: ATIVAN  TAKE 1 TABLET BY MOUTH EVERYDAY AT BEDTIME   Magnesium  400 MG Tabs Take 400 mg by mouth daily.   MELATONIN PO Take by mouth.   midodrine  5 MG tablet Commonly known as: PROAMATINE  Take 1 tablet (5 mg total) by mouth 3 (three) times daily with meals. What changed: Another medication with the same name was removed. Continue taking this medication, and follow the directions you see here.   multivitamin with minerals Tabs tablet Take 1 tablet by mouth daily. Centrum Silver   nystatin  powder Commonly known as: MYCOSTATIN /NYSTOP  Apply 1 Application topically 3 (three) times daily.   nystatin  cream Commonly known as: MYCOSTATIN  Apply 1 Application topically 2  (two) times daily.   onetouch ultrasoft lancets Use to check blood sugar 3 times a day and document results, bring to appointments.  Goal is <130 fasting blood sugar and <180 two hours after meals.   OneTouch Verio w/Device Kit Use to check blood sugar 3 times a day and document results, bring to appointments.  Goal is <130 fasting blood sugar and <180 two hours after meals.   oxymetazoline  0.05 % nasal spray Commonly known as: AFRIN Place 2 sprays into both nostrils at bedtime.   pantoprazole  20 MG tablet Commonly known as: PROTONIX  TAKE 1 TABLET (20 MG TOTAL) BY MOUTH 2 (TWO) TIMES DAILY BEFORE A MEAL. What changed: when to take this   potassium chloride  SA 20 MEQ tablet Commonly known as: KLOR-CON  M Take 2 tablets (40 meq) with Torsemide    QUEtiapine  Fumarate 150 MG 24 hr tablet Commonly known as: SEROQUEL  XR TAKE 1 TABLET BY MOUTH  EVERYDAY AT BEDTIME   rOPINIRole  0.25 MG tablet Commonly known as: REQUIP  Take 0.25 mg by mouth 4 (four) times daily.   rosuvastatin  40 MG tablet Commonly known as: CRESTOR  TAKE 1 TABLET BY MOUTH EVERY DAY   solifenacin  10 MG tablet Commonly known as: VESICARE  TAKE 1 TABLET BY MOUTH EVERY DAY   tiZANidine  4 MG tablet Commonly known as: Zanaflex  Take 1 tablet (4 mg total) by mouth every 6 (six) hours as needed for muscle spasms.   torsemide  20 MG tablet Commonly known as: DEMADEX  TAKE 2 TABLETS (40 MG TOTAL) BY MOUTH DAILY AS NEEDED (FLUID RETENTION).   venlafaxine  XR 75 MG 24 hr capsule Commonly known as: EFFEXOR -XR TAKE 3 CAPSULES BY MOUTH EVERY DAY   vitamin C  1000 MG tablet Take 1,000 mg by mouth daily.        Allergies:  No Known Allergies  Family History: Family History  Problem Relation Age of Onset   Alcoholism Mother        died in her 67's.   Cancer Mother    Stroke Father        deceased   Diabetes Father    Hypertension Father    Glaucoma Sister    Breast cancer Sister    Diabetes Daughter    Asthma Son     Stroke Paternal Grandmother    Prostate cancer Paternal Grandfather     Social History:   reports that he has never smoked. He has never used smokeless tobacco. He reports that he does not currently use alcohol. He reports that he does not use drugs.  Physical Exam: BP 139/80   Pulse 73   Constitutional:  Alert and oriented, no acute distress, nontoxic appearing HEENT: [place], AT Cardiovascular: No clubbing, cyanosis, or edema Respiratory: Normal respiratory effort, no increased work of breathing Skin: No rashes, bruises or suspicious lesions Neurologic: Grossly intact, no focal deficits, moving all 4 extremities Psychiatric: Normal mood and affect  Laboratory Data: Results for orders placed or performed in visit on 01/29/23  Bladder Scan (Post Void Residual) in office   Collection Time: 01/29/23  3:16 PM  Result Value Ref Range   Scan Result 95 ml    Assessment & Plan:   1. Recurrent UTI (Primary) Recurrent UTI in the setting of Jardiance  use.  PVR WNL today.  His UA is notable for glucosuria, but otherwise bland today.  We discussed the role of SGLT2 inhibitors in glucosuria and subsequent urogenital infections.  I encouraged him to follow-up with his PCP to discuss alternative pharmacotherapy.  I also offered him cystoscopy to evaluate him for recurrent stricture, which may be contributory, however he declined this.  I also offered him low-dose antibiotic prophylaxis and we discussed the risks of increased antibiotic resistance with this use.  He would like to pursue this, starting trimethoprim  today.  I'd like him to keep scheduled follow-up with Dr. Penne next month. - Urinalysis, Complete - Bladder Scan (Post Void Residual) in office - trimethoprim  (TRIMPEX ) 100 MG tablet; Take 1 tablet (100 mg total) by mouth daily.  Dispense: 30 tablet; Refill: 3   Return for Keep follow-up as scheduled.  Lucie Hones, PA-C  [facility] [address] [place], [place] [address] [phone]

## 2023-01-30 ENCOUNTER — Encounter: Payer: Medicare Other | Admitting: *Deleted

## 2023-01-30 VITALS — Ht 71.5 in | Wt 272.6 lb

## 2023-01-30 DIAGNOSIS — R0609 Other forms of dyspnea: Secondary | ICD-10-CM | POA: Diagnosis not present

## 2023-01-30 DIAGNOSIS — I2724 Chronic thromboembolic pulmonary hypertension: Secondary | ICD-10-CM

## 2023-01-30 NOTE — Progress Notes (Signed)
 Daily Session Note  Patient Details  Name: Nicholas [name] MRN: [id number] Date of Birth: 09-30-1945 Referring Provider:   Flowsheet Row Pulmonary Rehab from 10/18/2022 in [facility] Cardiac and Pulmonary Rehab  Referring Provider Dr. Bertrand Rosales       Encounter Date: 01/30/2023  Check In:  Session Check In - 01/30/23 1120       Check-In   Supervising physician immediately available to respond to emergencies See telemetry face sheet for immediately available ER MD    Location [facility]-Cardiac & Pulmonary Rehab    Staff Present Othel Durand, RN, BSN, CCRP;Margaret Best, MS, Exercise Physiologist;Maxon Conetta BS, , Exercise Physiologist;Meredith Tressa, RN BSN    Virtual Visit No    Medication changes reported     No    Fall or balance concerns reported    No    Warm-up and Cool-down Performed on first and last piece of equipment    Resistance Training Performed Yes    VAD Patient? No    PAD/SET Patient? No      Pain Assessment   Currently in Pain? No/denies                Social History   Tobacco Use  Smoking Status Never  Smokeless Tobacco Never    Goals Met:  Proper associated with RPD/PD & O2 Sat Independence with exercise equipment Exercise tolerated well No report of concerns or symptoms today  Goals Unmet:  Not Applicable  Comments: Pt able to follow exercise prescription today without complaint.  Will continue to monitor for progression.    Dr. Oneil Pinal is Medical Director for [facility] Cardiac Rehabilitation.  Dr. Fuad Aleskerov is Medical Director for [facility] Pulmonary Rehabilitation.

## 2023-01-30 NOTE — Patient Instructions (Signed)
 Discharge Patient Instructions  Patient Details  Name: Nicholas [name] MRN: [id number] Date of Birth: 01/26/1945 Referring Provider:  Valerio Melanie ONEIDA, NP   Number of Visits: 60  Reason for Discharge:  Patient reached a stable level of exercise. Patient independent in their exercise. Patient has met program and personal goals.   Diagnosis:  DOE (dyspnea on exertion)  Chronic thromboembolic pulmonary hypertension (HCC)  Initial Exercise Prescription:  Initial Exercise Prescription - 10/18/22 1200       Date of Initial Exercise RX and Referring Provider   Date 10/18/22    Referring Provider Dr. Bertrand Rosales      Oxygen   Maintain Oxygen Saturation 88% or higher      NuStep   Level 1   T6   SPM 80    Minutes 15    METs 1.07      REL-XR   Level 1    Speed 50    Minutes 15    METs 1.07      Track   Laps 10    Minutes 15    METs 1.54      Prescription Details   Frequency (times per week) 2    Duration Progress to 30 minutes of continuous aerobic without signs/symptoms of physical distress      Intensity   THRR 40-80% of Max Heartrate 116-134    Ratings of Perceived Exertion 11-13    Perceived Dyspnea 0-4      Progression   Progression Continue to progress workloads to maintain intensity without signs/symptoms of physical distress.      Resistance Training   Training Prescription Yes    Weight 3    Reps 10-15             Discharge Exercise Prescription (Final Exercise Prescription Changes):  Exercise Prescription Changes - 01/18/23 0700       Response to Exercise   Blood Pressure (Admit) 142/72    Blood Pressure (Exit) 120/70    Heart Rate (Admit) 80 bpm    Heart Rate (Exercise) 111 bpm    Heart Rate (Exit) 95 bpm    Oxygen Saturation (Admit) 93 %    Oxygen Saturation (Exercise) 91 %    Oxygen Saturation (Exit) 93 %    Rating of Perceived Exertion (Exercise) 12    Perceived Dyspnea (Exercise) 1    Symptoms none    Duration Continue  with 30 min of aerobic exercise without signs/symptoms of physical distress.    Intensity THRR unchanged      Progression   Progression Continue to progress workloads to maintain intensity without signs/symptoms of physical distress.    Average METs 2.93      Resistance Training   Training Prescription Yes    Weight 3 lb    Reps 10-15      Interval Training   Interval Training No      NuStep   Level 5   T6   Minutes 15    METs 3      REL-XR   Level 5    Minutes 15    METs 4.2      Track   Laps 30    Minutes 15    METs 2.31      Home Exercise Plan   Plans to continue exercise at Home (comment)   states that he will continue his use of hand weights as well as walking around with his wife while they run errands or go to  doctors appointments.   Frequency Add 2 additional days to program exercise sessions.    Initial Home Exercises Provided 12/26/22      Oxygen   Maintain Oxygen Saturation 88% or higher             Functional Capacity:  6 Minute Walk     Row Name 10/18/22 1233 01/30/23 0940       6 Minute Walk   Phase Initial Discharge    Distance 825 feet 1260 feet    Distance % Change -- 52.7 %    Distance Feet Change -- 435 ft    Walk Time 6 minutes 6 minutes    # of Rest Breaks 0 0    MPH 1.56 2.39    METS 1.07 1.85    RPE 13 11    Perceived Dyspnea  3 1    VO2 Peak 3.76 6.47    Symptoms Yes (comment) No    Comments SOB --    Resting HR 99 bpm 77 bpm    Resting BP 110/60 116/62    Resting Oxygen Saturation  95 % 93 %    Exercise Oxygen Saturation  during 6 min walk 93 % 94 %    Max Ex. HR 107 bpm 103 bpm    Max Ex. BP 128/70 126/64    2 Minute Post BP 118/76 124/68      Interval HR   1 Minute HR 81 92    2 Minute HR 70 94    3 Minute HR 84 95    4 Minute HR 82 99    5 Minute HR 77 100    6 Minute HR 107 103    2 Minute Post HR 102 96    Interval Heart Rate? Yes Yes      Interval Oxygen   Interval Oxygen? Yes Yes    Baseline Oxygen  Saturation % 95 % 93 %    1 Minute Oxygen Saturation % 93 % 96 %    1 Minute Liters of Oxygen 0 L 0 L  RA    2 Minute Oxygen Saturation % 94 % 94 %    2 Minute Liters of Oxygen 0 L 0 L    3 Minute Oxygen Saturation % 93 % 95 %    3 Minute Liters of Oxygen 0 L 0 L    4 Minute Oxygen Saturation % 93 % 94 %    4 Minute Liters of Oxygen 0 L 0 L    5 Minute Oxygen Saturation % 94 % 95 %    5 Minute Liters of Oxygen 0 L 0 L    6 Minute Oxygen Saturation % 94 % 95 %    6 Minute Liters of Oxygen 0 L 0 L    2 Minute Post Oxygen Saturation % 94 % 98 %    2 Minute Post Liters of Oxygen 0 L 0 L            Nutrition & Weight - Outcomes:  Pre Biometrics - 10/18/22 1248       Pre Biometrics   Height 5' 11.5 (1.816 m)    Weight 280 lb 6.4 oz (127.2 kg)    Waist Circumference 51 inches    Hip Circumference 53.5 inches    Waist to Hip Ratio 0.95 %    BMI (Calculated) 38.57    Single Leg Stand 1.6 seconds  Post Biometrics - 01/30/23 9057        Post  Biometrics   Height 5' 11.5 (1.816 m)    Weight 272 lb 9.6 oz (123.7 kg)    Waist Circumference 48.5 inches    Hip Circumference 51.5 inches    Waist to Hip Ratio 0.94 %    BMI (Calculated) 37.49    Single Leg Stand 2.96 seconds             Nutrition:  Nutrition Therapy & Goals - 10/18/22 1319       Nutrition Therapy   Diet Cardiac, Low Na    Protein (specify units) 75    Fiber 30 grams    Whole Grain Foods 3 servings    Saturated Fats 15 max. grams    Fruits and Vegetables 5 servings/day    Sodium 2 grams      Personal Nutrition Goals   Nutrition Goal Drink 2 bottles of water  daily    Personal Goal #2 Eat something nutrient/calorie dense at every meal hour    Personal Goal #3 Pair a complex carb with a protein or healthy fat    Comments Patient drinking ~20oz of water  daily. Spoke about the importance of hydration and set short term goal of drinking 2 bottles per day (~32oz), with a long term goal of  ~64oz daily. He reports that he likes to snack on sweets, but his appetite has been less than it used to be, often not missing a meal or getting full quickly. Reviewed mediterranean diet handout, educated on types of fats, sources, how to read food label. Educated on how healthy fats can be heart healthy and high in calories. Explain how choosing calorie dense foods such as peanut butter, olives, olives or avocado oils. Reviewed his 24hr food recall, explained how he should look to pair complex carbs with a protein or healthy fats when making meals or snacks. Brainstormed and built out a few example meals and snacks focusing on getting enough nutrition with his poor appetite.      Intervention Plan   Intervention Prescribe, educate and counsel regarding individualized specific dietary modifications aiming towards targeted core components such as weight, hypertension, lipid management, diabetes, heart failure and other comorbidities.;Nutrition handout(s) given to patient.    Expected Outcomes Short Term Goal: Understand basic principles of dietary content, such as calories, fat, sodium, cholesterol and nutrients.;Short Term Goal: A plan has been developed with personal nutrition goals set during dietitian appointment.;Long Term Goal: Adherence to prescribed nutrition plan.

## 2023-02-01 ENCOUNTER — Encounter: Payer: Medicare Other | Admitting: *Deleted

## 2023-02-01 DIAGNOSIS — R0609 Other forms of dyspnea: Secondary | ICD-10-CM

## 2023-02-01 NOTE — Progress Notes (Signed)
 Daily Session Note  Patient Details  Name: Nicholas [name] MRN: [id number] Date of Birth: 01-Nov-1945 Referring Provider:   Flowsheet Row Pulmonary Rehab from 10/18/2022 in [facility] Cardiac and Pulmonary Rehab  Referring Provider Dr. Bertrand Rosales       Encounter Date: 02/01/2023  Check In:  Session Check In - 02/01/23 0926       Check-In   Supervising physician immediately available to respond to emergencies See telemetry face sheet for immediately available ER MD    Location [facility]-Cardiac & Pulmonary Rehab    Staff Present Othel Durand, RN, BSN, CCRP;Joseph Hood, RCP,RRT,BSRT;Maxon Time BS, , Exercise Physiologist;Annalysia Willenbring Claudene, RN, [place]    Virtual Visit No    Medication changes reported     Yes    Comments started trimethoprim     Fall or balance concerns reported    No    Warm-up and Cool-down Performed on first and last piece of equipment    Resistance Training Performed Yes    VAD Patient? No    PAD/SET Patient? No      Pain Assessment   Currently in Pain? No/denies                Social History   Tobacco Use  Smoking Status Never  Smokeless Tobacco Never    Goals Met:  Independence with exercise equipment Exercise tolerated well No report of concerns or symptoms today Strength training completed today  Goals Unmet:  Not Applicable  Comments: Pt able to follow exercise prescription today without complaint.  Will continue to monitor for progression.    Dr. Oneil Pinal is Medical Director for [facility] Cardiac Rehabilitation.  Dr. Fuad Aleskerov is Medical Director for [facility] Pulmonary Rehabilitation.

## 2023-02-06 ENCOUNTER — Encounter: Payer: Medicare Other | Admitting: *Deleted

## 2023-02-06 DIAGNOSIS — R0609 Other forms of dyspnea: Secondary | ICD-10-CM | POA: Diagnosis not present

## 2023-02-06 NOTE — Progress Notes (Signed)
 Daily Session Note  Patient Details  Name: Nicholas [name] MRN: [id number] Date of Birth: 07-18-1945 Referring Provider:   Flowsheet Row Pulmonary Rehab from 10/18/2022 in [facility] Cardiac and Pulmonary Rehab  Referring Provider Dr. Bertrand Rosales       Encounter Date: 02/06/2023  Check In:  Session Check In - 02/06/23 0928       Check-In   Supervising physician immediately available to respond to emergencies See telemetry face sheet for immediately available ER MD    Location [facility]-Cardiac & Pulmonary Rehab    Staff Present Rollene Paterson, MS, Exercise Physiologist;Araceli Coufal, RN, BSN, CCRP;Maxon Conetta BS, , Exercise Physiologist;Krista Firefighter, BSN    Virtual Visit No    Medication changes reported     No    Fall or balance concerns reported    No    Warm-up and Cool-down Performed on first and last piece of equipment    Resistance Training Performed Yes    VAD Patient? No    PAD/SET Patient? No      Pain Assessment   Currently in Pain? No/denies                Social History   Tobacco Use  Smoking Status Never  Smokeless Tobacco Never    Goals Met:  Proper associated with RPD/PD & O2 Sat Independence with exercise equipment Exercise tolerated well No report of concerns or symptoms today  Goals Unmet:  Not Applicable  Comments: Pt able to follow exercise prescription today without complaint.  Will continue to monitor for progression.    Dr. Oneil Pinal is Medical Director for [facility] Cardiac Rehabilitation.  Dr. Fuad Aleskerov is Medical Director for [facility] Pulmonary Rehabilitation.

## 2023-02-07 DIAGNOSIS — R0609 Other forms of dyspnea: Secondary | ICD-10-CM

## 2023-02-07 NOTE — Progress Notes (Signed)
 Pulmonary Individual Treatment Plan  Patient Details  Name: Nicholas [name] MRN: [id number] Date of Birth: December 22, 1945 Referring Provider:   Flowsheet Row Pulmonary Rehab from 10/18/2022 in [facility] Cardiac and Pulmonary Rehab  Referring Provider Dr. Sherol Dixie       Initial Encounter Date:  Flowsheet Row Pulmonary Rehab from 10/18/2022 in [facility] Cardiac and Pulmonary Rehab  Date 10/18/22       Visit Diagnosis: DOE (dyspnea on exertion)  Patient's Home Medications on Admission:  Current Outpatient Medications:    acetaminophen  (TYLENOL ) 650 MG CR tablet, Take 650 mg by mouth every 8 (eight) hours as needed for pain., Disp: , Rfl:    albuterol  (VENTOLIN  HFA) 108 (90 Base) MCG/ACT inhaler, INHALE 2 PUFFS BY MOUTH EVERY 6 HOURS AS NEEDED, Disp: 6.7 each, Rfl: 2   apixaban  (ELIQUIS ) 5 MG TABS tablet, Take 1 tablet (5 mg total) by mouth 2 (two) times daily., Disp: 180 tablet, Rfl: 1   Ascorbic Acid  (VITAMIN C ) 1000 MG tablet, Take 1,000 mg by mouth daily., Disp: , Rfl:    Blood Glucose Monitoring Suppl (ONETOUCH VERIO) w/Device KIT, Use to check blood sugar 3 times a day and document results, bring to appointments.  Goal is <130 fasting blood sugar and <180 two hours after meals., Disp: 1 kit, Rfl: 0   calcium -vitamin D  (OSCAL WITH D) 500-5 MG-MCG tablet, Take 1 tablet by mouth daily with breakfast., Disp: , Rfl:    Cranberry 500 MG CAPS, Take 500 mg by mouth daily., Disp: , Rfl:    empagliflozin  (JARDIANCE ) 25 MG TABS tablet, Take 1 tablet (25 mg total) by mouth daily., Disp: 30 tablet, Rfl: 4   gabapentin  (NEURONTIN ) 600 MG tablet, TAKE 1 TABLET BY MOUTH EVERYDAY AT BEDTIME, Disp: 90 tablet, Rfl: 4   glucose blood test strip, Use to check blood sugar 3 times a day and document results, bring to appointments.  Goal is <130 fasting blood sugar and <180 two hours after meals., Disp: 100 each, Rfl: 12   Lancets (ONETOUCH ULTRASOFT) lancets, Use to check blood sugar 3 times a day and document  results, bring to appointments.  Goal is <130 fasting blood sugar and <180 two hours after meals., Disp: 100 each, Rfl: 12   LORazepam  (ATIVAN ) 1 MG tablet, TAKE 1 TABLET BY MOUTH EVERYDAY AT BEDTIME, Disp: 90 tablet, Rfl: 0   Magnesium  400 MG TABS, Take 400 mg by mouth daily. , Disp: , Rfl:    MELATONIN PO, Take by mouth., Disp: , Rfl:    midodrine  (PROAMATINE ) 5 MG tablet, Take 1 tablet (5 mg total) by mouth 3 (three) times daily with meals., Disp: 270 tablet, Rfl: 3   Multiple Vitamin (MULTIVITAMIN WITH MINERALS) TABS tablet, Take 1 tablet by mouth daily. Centrum Silver, Disp: , Rfl:    nystatin  (MYCOSTATIN /NYSTOP ) powder, Apply 1 Application topically 3 (three) times daily., Disp: 60 g, Rfl: 2   nystatin  cream (MYCOSTATIN ), Apply 1 Application topically 2 (two) times daily., Disp: 30 g, Rfl: 4   Omega-3 Fatty Acids (FISH OIL) 1200 MG CAPS, Take 1,200 mg by mouth daily., Disp: , Rfl:    oxymetazoline  (AFRIN) 0.05 % nasal spray, Place 2 sprays into both nostrils at bedtime., Disp: , Rfl:    pantoprazole  (PROTONIX ) 20 MG tablet, TAKE 1 TABLET (20 MG TOTAL) BY MOUTH 2 (TWO) TIMES DAILY BEFORE A MEAL. (Patient taking differently: Take 20 mg by mouth daily.), Disp: 180 tablet, Rfl: 2   potassium chloride  SA (KLOR-CON  M) 20 MEQ  tablet, Take 2 tablets (40 meq) with Torsemide , Disp: 30 tablet, Rfl: 3   QUEtiapine  Fumarate (SEROQUEL  XR) 150 MG 24 hr tablet, TAKE 1 TABLET BY MOUTH EVERYDAY AT BEDTIME, Disp: 90 tablet, Rfl: 4   rOPINIRole  (REQUIP ) 0.25 MG tablet, Take 0.25 mg by mouth 4 (four) times daily., Disp: , Rfl:    rosuvastatin  (CRESTOR ) 40 MG tablet, TAKE 1 TABLET BY MOUTH EVERY DAY, Disp: 90 tablet, Rfl: 1   solifenacin  (VESICARE ) 10 MG tablet, TAKE 1 TABLET BY MOUTH EVERY DAY, Disp: 90 tablet, Rfl: 0   tiZANidine  (ZANAFLEX ) 4 MG tablet, Take 1 tablet (4 mg total) by mouth every 6 (six) hours as needed for muscle spasms., Disp: 90 tablet, Rfl: 4   torsemide  (DEMADEX ) 20 MG tablet, TAKE 2 TABLETS  (40 MG TOTAL) BY MOUTH DAILY AS NEEDED (FLUID RETENTION)., Disp: 90 tablet, Rfl: 0   trimethoprim  (TRIMPEX ) 100 MG tablet, Take 1 tablet (100 mg total) by mouth daily., Disp: 30 tablet, Rfl: 3   venlafaxine  XR (EFFEXOR -XR) 75 MG 24 hr capsule, TAKE 3 CAPSULES BY MOUTH EVERY DAY, Disp: 270 capsule, Rfl: 0  Past Medical History: Past Medical History:  Diagnosis Date   Abdominal pain, chronic, right lower quadrant 12/13/2020   Anterior urethral stricture    Anxiety    Arthritis    a. knees, hips, hands;  b. 11/2013 s/p L TKA @ [facility].   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 Dr Rodolfo Clan   Depression    DVT (deep venous thrombosis) (HCC)    Erosive esophagitis    Gross hematuria    Hyperlipemia    Hypertension    borderline   Internal hemorrhoids    Phimosis    Pulmonary embolism (HCC)     Tobacco Use: Social History   Tobacco Use  Smoking Status Never  Smokeless Tobacco Never    Labs: Review Flowsheet  More data exists      Latest Ref Rng & Units 11/29/2021 03/01/2022 05/30/2022 10/02/2022 01/01/2023  Labs for ITP Cardiac and Pulmonary Rehab  Cholestrol 100 - 199 mg/dL 841  324  401  027  253   LDL (calc) 0 - 99 mg/dL 63  58  59  664  63   HDL-C >39 mg/dL 49  58  49  42  50   Trlycerides 0 - 149 mg/dL 403  97  474  259  95   Hemoglobin A1c 4.8 - 5.6 % 6.8  6.8  7.0  7.0  6.3      Pulmonary Assessment Scores:  Pulmonary Assessment Scores     Row Name 10/18/22 1252 02/01/23 0947       ADL UCSD   ADL Phase Entry Exit    SOB Score total 89 30    Rest 0 0    Walk 5 1    Stairs 5 3    Bath 5 2    Dress 4 0    Shop 5 1      CAT Score   CAT Score 20 11      mMRC Score   mMRC Score 3 --             UCSD: Self-administered rating of dyspnea associated with activities of daily living (ADLs) 6-point scale (0 = "not at all" to 5 =  "maximal  or unable to do because of breathlessness")  Scoring Scores range from 0 to 120.  Minimally important difference is 5 units  CAT: CAT can identify the health impairment of COPD patients and is better correlated with disease progression.  CAT has a scoring range of zero to 40. The CAT score is classified into four groups of low (less than 10), medium (10 - 20), high (21-30) and very high (31-40) based on the impact level of disease on health status. A CAT score over 10 suggests significant symptoms.  A worsening CAT score could be explained by an exacerbation, poor medication adherence, poor inhaler technique, or progression of COPD or comorbid conditions.  CAT MCID is 2 points  mMRC: mMRC (Modified Medical Research Council) Dyspnea Scale is used to assess the degree of baseline functional disability in patients of respiratory disease due to dyspnea. No minimal important difference is established. A decrease in score of 1 point or greater is considered a positive change.   Pulmonary Function Assessment:  Pulmonary Function Assessment - 10/09/22 1023       Breath   Shortness of Breath Yes;Limiting activity             Exercise Target Goals: Exercise Program Goal: Individual exercise prescription set using results from initial 6 min walk test and THRR while considering  patient's activity barriers and safety.   Exercise Prescription Goal: Initial exercise prescription builds to 30-45 minutes a day of aerobic activity, 2-3 days per week.  Home exercise guidelines will be given to patient during program as part of exercise prescription that the participant will acknowledge.  Education: Aerobic Exercise: - Group verbal and visual presentation on the components of exercise prescription. Introduces F.I.T.T principle from ACSM for exercise prescriptions.  Reviews F.I.T.T. principles of aerobic exercise including progression. Written material given at graduation.   Education:  Resistance Exercise: - Group verbal and visual presentation on the components of exercise prescription. Introduces F.I.T.T principle from ACSM for exercise prescriptions  Reviews F.I.T.T. principles of resistance exercise including progression. Written material given at graduation.    Education: Exercise & Equipment Safety: - Individual verbal instruction and demonstration of equipment use and safety with use of the equipment. Flowsheet Row Pulmonary Rehab from 12/07/2022 in [facility] Cardiac and Pulmonary Rehab  Date 10/18/22  Educator [facility]  Instruction Review Code 1- Verbalizes Understanding       Education: Exercise Physiology & General Exercise Guidelines: - Group verbal and written instruction with models to review the exercise physiology of the cardiovascular system and associated critical values. Provides general exercise guidelines with specific guidelines to those with heart or lung disease.  Flowsheet Row Pulmonary Rehab from 12/07/2022 in [facility] Cardiac and Pulmonary Rehab  Date 11/09/22  Educator [facility]  Instruction Review Code 1- [employer] Understanding       Education: Flexibility, Balance, Mind/Body Relaxation: - Group verbal and visual presentation with interactive activity on the components of exercise prescription. Introduces F.I.T.T principle from ACSM for exercise prescriptions. Reviews F.I.T.T. principles of flexibility and balance exercise training including progression. Also discusses the mind body connection.  Reviews various relaxation techniques to help reduce and manage stress (i.e. Deep breathing, progressive muscle relaxation, and visualization). Balance handout provided to take home. Written material given at graduation.   Activity Barriers & Risk Stratification:  Activity Barriers & Cardiac Risk Stratification - 10/18/22 1236       Activity Barriers & Cardiac Risk Stratification   Activity Barriers Right Hip Replacement;Left Knee Replacement;Left Hip  Replacement;Right Knee  Replacement             6 Minute Walk:  6 Minute Walk     Row Name 10/18/22 1233 01/30/23 0940       6 Minute Walk   Phase Initial Discharge    Distance 825 feet 1260 feet    Distance % Change -- 52.7 %    Distance Feet Change -- 435 ft    Walk Time 6 minutes 6 minutes    # of Rest Breaks 0 0    MPH 1.56 2.39    METS 1.07 1.85    RPE 13 11    Perceived Dyspnea  3 1    VO2 Peak 3.76 6.47    Symptoms Yes (comment) No    Comments SOB --    Resting HR 99 bpm 77 bpm    Resting BP 110/60 116/62    Resting Oxygen Saturation  95 % 93 %    Exercise Oxygen Saturation  during 6 min walk 93 % 94 %    Max Ex. HR 107 bpm 103 bpm    Max Ex. BP 128/70 126/64    2 Minute Post BP 118/76 124/68      Interval HR   1 Minute HR 81 92    2 Minute HR 70 94    3 Minute HR 84 95    4 Minute HR 82 99    5 Minute HR 77 100    6 Minute HR 107 103    2 Minute Post HR 102 96    Interval Heart Rate? Yes Yes      Interval Oxygen   Interval Oxygen? Yes Yes    Baseline Oxygen Saturation % 95 % 93 %    1 Minute Oxygen Saturation % 93 % 96 %    1 Minute Liters of Oxygen 0 L 0 L  RA    2 Minute Oxygen Saturation % 94 % 94 %    2 Minute Liters of Oxygen 0 L 0 L    3 Minute Oxygen Saturation % 93 % 95 %    3 Minute Liters of Oxygen 0 L 0 L    4 Minute Oxygen Saturation % 93 % 94 %    4 Minute Liters of Oxygen 0 L 0 L    5 Minute Oxygen Saturation % 94 % 95 %    5 Minute Liters of Oxygen 0 L 0 L    6 Minute Oxygen Saturation % 94 % 95 %    6 Minute Liters of Oxygen 0 L 0 L    2 Minute Post Oxygen Saturation % 94 % 98 %    2 Minute Post Liters of Oxygen 0 L 0 L            Oxygen Initial Assessment:  Oxygen Initial Assessment - 10/18/22 1252       Home Oxygen   Home Oxygen Device None    Sleep Oxygen Prescription CPAP    Liters per minute 0    Home Exercise Oxygen Prescription None    Home Resting Oxygen Prescription None    Compliance with Home Oxygen Use  Yes      Initial 6 min Walk   Oxygen Used None      Program Oxygen Prescription   Program Oxygen Prescription None      Intervention   Short Term Goals To learn and exhibit compliance with exercise, home and travel O2 prescription;To learn and understand importance  of monitoring SPO2 with pulse oximeter and demonstrate accurate use of the pulse oximeter.;To learn and understand importance of maintaining oxygen saturations>88%;To learn and demonstrate proper pursed lip breathing techniques or other breathing techniques. ;To learn and demonstrate proper use of respiratory medications    Long  Term Goals Exhibits compliance with exercise, home  and travel O2 prescription;Verbalizes importance of monitoring SPO2 with pulse oximeter and return demonstration;Maintenance of O2 saturations>88%;Exhibits proper breathing techniques, such as pursed lip breathing or other method taught during program session;Compliance with respiratory medication;Demonstrates proper use of MDI's             Oxygen Re-Evaluation:  Oxygen Re-Evaluation     Row Name 11/09/22 0928 11/30/22 0952 01/18/23 1009         Program Oxygen Prescription   Program Oxygen Prescription None None None       Home Oxygen   Home Oxygen Device None None None     Sleep Oxygen Prescription CPAP CPAP CPAP     Liters per minute 0 0 0     Home Exercise Oxygen Prescription None None None     Home Resting Oxygen Prescription None None None     Compliance with Home Oxygen Use Yes Yes Yes       Goals/Expected Outcomes   Short Term Goals To learn and demonstrate proper pursed lip breathing techniques or other breathing techniques.  To learn and demonstrate proper pursed lip breathing techniques or other breathing techniques.  To learn and demonstrate proper pursed lip breathing techniques or other breathing techniques.      Long  Term Goals Exhibits proper breathing techniques, such as pursed lip breathing or other method taught during  program session Exhibits proper breathing techniques, such as pursed lip breathing or other method taught during program session Exhibits proper breathing techniques, such as pursed lip breathing or other method taught during program session     Comments Informed patient how to perform the Pursed Lipped breathing technique. Told patient to Inhale through the nose and out the mouth with pursed lips to keep their airways open, help oxygenate them better, practice when at rest or doing strenuous activity. Patient Verbalizes understanding of technique and will work on and be reiterated during [facility]. Reviewed PLB techique with pt. He states that he continues to practice PLB at rest and during exercise. He reports that his pulmonologist also encouraged him to contnue to eat correctly and exercise to improve SOB. Nicholas [name] states that he continues to practice PLB at rest and during exercise. He reports tthat his breathing is easier with exercise and ADL since he started the program.     Goals/Expected Outcomes Short: use PLB with exertion. Long: use PLB on exertion proficiently and independently. Short: Continue to use PLB with exertion. Long: Become proficient and independent with PLB. Short: Continue to use PLB with exertion. Long: Become proficient and independent with PLB.              Oxygen Discharge (Final Oxygen Re-Evaluation):  Oxygen Re-Evaluation - 01/18/23 1009       Program Oxygen Prescription   Program Oxygen Prescription None      Home Oxygen   Home Oxygen Device None    Sleep Oxygen Prescription CPAP    Liters per minute 0    Home Exercise Oxygen Prescription None    Home Resting Oxygen Prescription None    Compliance with Home Oxygen Use Yes      Goals/Expected Outcomes   Short Term Goals  To learn and demonstrate proper pursed lip breathing techniques or other breathing techniques.     Long  Term Goals Exhibits proper breathing techniques, such as pursed lip breathing or other  method taught during program session    Comments Nicholas [name] states that he continues to practice PLB at rest and during exercise. He reports tthat his breathing is easier with exercise and ADL since he started the program.    Goals/Expected Outcomes Short: Continue to use PLB with exertion. Long: Become proficient and independent with PLB.             Initial Exercise Prescription:  Initial Exercise Prescription - 10/18/22 1200       Date of Initial Exercise RX and Referring Provider   Date 10/18/22    Referring Provider Dr. Sherol Dixie      Oxygen   Maintain Oxygen Saturation 88% or higher      NuStep   Level 1   T6   SPM 80    Minutes 15    METs 1.07      REL-XR   Level 1    Speed 50    Minutes 15    METs 1.07      Track   Laps 10    Minutes 15    METs 1.54      Prescription Details   Frequency (times per week) 2    Duration Progress to 30 minutes of continuous aerobic without signs/symptoms of physical distress      Intensity   THRR 40-80% of Max Heartrate 116-134    Ratings of Perceived Exertion 11-13    Perceived Dyspnea 0-4      Progression   Progression Continue to progress workloads to maintain intensity without signs/symptoms of physical distress.      Resistance Training   Training Prescription Yes    Weight 3    Reps 10-15             Perform Capillary Blood Glucose checks as needed.  Exercise Prescription Changes:   Exercise Prescription Changes     Row Name 10/18/22 1200 10/19/22 1000 10/26/22 1600 11/09/22 1600 11/22/22 1100     Response to Exercise   Blood Pressure (Admit) 110/60 110/60 114/64 120/68 124/74   Blood Pressure (Exercise) 128/70 128/70 128/70 136/68 150/70   Blood Pressure (Exit) 118/76 118/76 130/78 108/58 122/62   Heart Rate (Admit) 99 bpm 99 bpm 98 bpm 87 bpm 86 bpm   Heart Rate (Exercise) 107 bpm 107 bpm 98 bpm 108 bpm 109 bpm   Heart Rate (Exit) 92 bpm 92 bpm 99 bpm 94 bpm 97 bpm   Oxygen Saturation  (Admit) 95 % 95 % 93 % 94 % 93 %   Oxygen Saturation (Exercise) 93 % 93 % 89 % 91 % 91 %   Oxygen Saturation (Exit) 94 % 94 % 93 % 94 % 93 %   Rating of Perceived Exertion (Exercise) 13 13 15 15 15    Perceived Dyspnea (Exercise) 3 3 2 2 1    Symptoms SOB SOB -- SOB SOB   Comments 6 MWT results 6 MWT results -- -- --   Duration -- -- Continue with 30 min of aerobic exercise without signs/symptoms of physical distress. Continue with 30 min of aerobic exercise without signs/symptoms of physical distress. Continue with 30 min of aerobic exercise without signs/symptoms of physical distress.   Intensity -- -- THRR unchanged THRR unchanged THRR unchanged     Progression   Progression -- --  Continue to progress workloads to maintain intensity without signs/symptoms of physical distress. Continue to progress workloads to maintain intensity without signs/symptoms of physical distress. Continue to progress workloads to maintain intensity without signs/symptoms of physical distress.   Average METs -- -- -- 2.21 2.49     Resistance Training   Training Prescription -- Yes Yes Yes Yes   Weight -- 3 3 3  lb 3 lb   Reps -- 10-15 10-15 10-15 10-15     Interval Training   Interval Training -- -- -- No No     NuStep   Level -- 1  T6 1  T6 4  T6 nustep 4  T6 nustep   SPM -- 80 80 -- --   Minutes -- 15 15 15 15    METs -- 1.07 2.4 2.6 2.9     REL-XR   Level -- 1 1 2 4    Speed -- 50 50 -- --   Minutes -- 15 15 15 15    METs -- 1.07 1.07 3.4 2.2     Track   Laps -- 10 -- 15  Hallway 24  hallway   Minutes -- 15 -- 15 15   METs -- 1.54 -- 1.65 2.03     Oxygen   Maintain Oxygen Saturation -- -- -- 88% or higher 88% or higher    Row Name 12/07/22 1500 12/19/22 1500 12/26/22 1000 01/04/23 1400 01/18/23 0700     Response to Exercise   Blood Pressure (Admit) 120/72 128/70 -- 134/70 142/72   Blood Pressure (Exercise) 130/70 -- -- -- --   Blood Pressure (Exit) 138/80 126/72 -- 118/70 120/70   Heart Rate  (Admit) 80 bpm 77 bpm -- 82 bpm 80 bpm   Heart Rate (Exercise) 113 bpm 106 bpm -- 106 bpm 111 bpm   Heart Rate (Exit) 91 bpm 90 bpm -- 89 bpm 95 bpm   Oxygen Saturation (Admit) 91 % 93 % -- 90 % 93 %   Oxygen Saturation (Exercise) 91 % 90 % -- 91 % 91 %   Oxygen Saturation (Exit) 93 % 96 % -- 92 % 93 %   Rating of Perceived Exertion (Exercise) 13 14 -- 13 12   Perceived Dyspnea (Exercise) 1 2 -- 1 1   Symptoms none none -- none none   Duration Continue with 30 min of aerobic exercise without signs/symptoms of physical distress. Continue with 30 min of aerobic exercise without signs/symptoms of physical distress. -- Continue with 30 min of aerobic exercise without signs/symptoms of physical distress. Continue with 30 min of aerobic exercise without signs/symptoms of physical distress.   Intensity THRR unchanged THRR unchanged -- THRR unchanged THRR unchanged     Progression   Progression Continue to progress workloads to maintain intensity without signs/symptoms of physical distress. Continue to progress workloads to maintain intensity without signs/symptoms of physical distress. -- Continue to progress workloads to maintain intensity without signs/symptoms of physical distress. Continue to progress workloads to maintain intensity without signs/symptoms of physical distress.   Average METs 2.9 2.94 -- 3.38 2.93     Resistance Training   Training Prescription Yes Yes -- Yes Yes   Weight 3 lb 3 lb -- 3 lb 3 lb   Reps 10-15 10-15 -- 10-15 10-15     Interval Training   Interval Training No No -- No No     NuStep   Level 4  T6 5  T6 nustep -- 5  T6 nustep 5  T6  Minutes 15 15 -- 15 15   METs 3.1 3.1 -- 3.2 3     REL-XR   Level 4 5 -- 5 5   Minutes 15 15 -- 15 15   METs 4.2 4.4 -- 4.4 4.2     Biostep-RELP   Level 3 -- -- -- --   Minutes 15 -- -- -- --   METs 3 -- -- -- --     Track   Laps 30 30 -- 30  Hallway 30   Minutes 15 15 -- 15 15   METs 2.63 2.63 -- 2.63 2.31     Home  Exercise Plan   Plans to continue exercise at -- -- Home (comment)  states that he will continue his use of hand weights as well as walking around with his wife while they run errands or go to doctors appointments. Home (comment)  states that he will continue his use of hand weights as well as walking around with his wife while they run errands or go to doctors appointments. Home (comment)  states that he will continue his use of hand weights as well as walking around with his wife while they run errands or go to doctors appointments.   Frequency -- -- Add 2 additional days to program exercise sessions. Add 2 additional days to program exercise sessions. Add 2 additional days to program exercise sessions.   Initial Home Exercises Provided -- -- 12/26/22 12/26/22 12/26/22     Oxygen   Maintain Oxygen Saturation 88% or higher 88% or higher -- 88% or higher 88% or higher    Row Name 02/02/23 0800             Response to Exercise   Blood Pressure (Admit) 126/76       Blood Pressure (Exit) 114/62       Heart Rate (Admit) 72 bpm       Heart Rate (Exercise) 111 bpm       Heart Rate (Exit) 90 bpm       Oxygen Saturation (Admit) 95 %       Oxygen Saturation (Exercise) 91 %       Oxygen Saturation (Exit) 97 %       Rating of Perceived Exertion (Exercise) 12       Perceived Dyspnea (Exercise) 1       Symptoms none       Duration Continue with 30 min of aerobic exercise without signs/symptoms of physical distress.       Intensity THRR unchanged         Progression   Progression Continue to progress workloads to maintain intensity without signs/symptoms of physical distress.       Average METs 3.33         Resistance Training   Training Prescription Yes       Weight 5 lb       Reps 10-15         Interval Training   Interval Training No         NuStep   Level 6  T6 nustep       Minutes 15       METs 3.5         REL-XR   Level 6       Minutes 15       METs 4.9         Track    Laps 30  Hallway       Minutes 15  METs 2.31         Home Exercise Plan   Plans to continue exercise at Home (comment)  states that he will continue his use of hand weights as well as walking around with his wife while they run errands or go to doctors appointments.       Frequency Add 2 additional days to program exercise sessions.       Initial Home Exercises Provided 12/26/22         Oxygen   Maintain Oxygen Saturation 88% or higher                Exercise Comments:   Exercise Comments     Row Name 10/19/22 1007           Exercise Comments First full day of exercise!  Patient was oriented to gym and equipment including functions, settings, policies, and procedures.  Patient's individual exercise prescription and treatment plan were reviewed.  All starting workloads were established based on the results of the 6 minute walk test done at initial orientation visit.  The plan for exercise progression was also introduced and progression will be customized based on patient's performance and goals.                Exercise Goals and Review:   Exercise Goals     Row Name 10/18/22 1247             Exercise Goals   Increase Physical Activity Yes       Intervention Provide advice, education, support and counseling about physical activity/exercise needs.;Develop an individualized exercise prescription for aerobic and resistive training based on initial evaluation findings, risk stratification, comorbidities and participant's personal goals.       Expected Outcomes Short Term: Attend rehab on a regular basis to increase amount of physical activity.;Long Term: Add in home exercise to make exercise part of routine and to increase amount of physical activity.;Long Term: Exercising regularly at least 3-5 days a week.       Increase Strength and Stamina Yes       Intervention Provide advice, education, support and counseling about physical activity/exercise needs.;Develop an  individualized exercise prescription for aerobic and resistive training based on initial evaluation findings, risk stratification, comorbidities and participant's personal goals.       Expected Outcomes Short Term: Increase workloads from initial exercise prescription for resistance, speed, and METs.;Short Term: Perform resistance training exercises routinely during rehab and add in resistance training at home;Long Term: Improve cardiorespiratory fitness, muscular endurance and strength as measured by increased METs and functional capacity ( )       Able to understand and use rate of perceived exertion (RPE) scale Yes       Intervention Provide education and explanation on how to use RPE scale       Expected Outcomes Short Term: Able to use RPE daily in rehab to express subjective intensity level;Long Term:  Able to use RPE to guide intensity level when exercising independently       Able to understand and use Dyspnea scale Yes       Intervention Provide education and explanation on how to use Dyspnea scale       Expected Outcomes Short Term: Able to use Dyspnea scale daily in rehab to express subjective sense of shortness of breath during exertion;Long Term: Able to use Dyspnea scale to guide intensity level when exercising independently       Knowledge and understanding of Target Heart Rate  Range (THRR) Yes       Intervention Provide education and explanation of THRR including how the numbers were predicted and where they are located for reference       Expected Outcomes Short Term: Able to state/look up THRR;Long Term: Able to use THRR to govern intensity when exercising independently;Short Term: Able to use daily as guideline for intensity in rehab       Able to check pulse independently Yes       Intervention Provide education and demonstration on how to check pulse in carotid and radial arteries.;Review the importance of being able to check your own pulse for safety during independent exercise        Expected Outcomes Short Term: Able to explain why pulse checking is important during independent exercise;Long Term: Able to check pulse independently and accurately       Understanding of Exercise Prescription Yes       Intervention Provide education, explanation, and written materials on patient's individual exercise prescription       Expected Outcomes Short Term: Able to explain program exercise prescription;Long Term: Able to explain home exercise prescription to exercise independently                Exercise Goals Re-Evaluation :  Exercise Goals Re-Evaluation     Row Name 10/19/22 1007 10/26/22 1700 11/09/22 1603 11/22/22 1107 12/07/22 1512     Exercise Goal Re-Evaluation   Exercise Goals Review Able to understand and use rate of perceived exertion (RPE) scale;Able to understand and use Dyspnea scale;Knowledge and understanding of Target Heart Rate Range (THRR);Understanding of Exercise Prescription Increase Physical Activity;Understanding of Exercise Prescription;Increase Strength and Stamina Increase Physical Activity;Increase Strength and Stamina;Understanding of Exercise Prescription Increase Physical Activity;Increase Strength and Stamina;Understanding of Exercise Prescription Increase Physical Activity;Increase Strength and Stamina;Understanding of Exercise Prescription   Comments Reviewed RPE and dyspnea scale, THR and program prescription with pt today.  Pt voiced understanding and was given a copy of goals to take home. Nicholas [name] is off to a good start in the program. He has completed one exercise session on this review. On his first day, he was able to follow his exercise prescription with the T6, and only was able to do 5 minutes on the XR. We will continue to monitor his progress in the program. Nicholas [name] is doing well in rehab. He has increased his laps walking to 15 laps in the hallway. He also improved to level 4 on the T6 nustep and level 2 on the XR. He continues to do well with  3 lb handweights for resistance training as well. We will continue to monitor his progress in the program. Nicholas [name] continues to do well in rehab. He recently increased his number of laps walked in the hallways to 24 laps! He also increased to level 4 on the XR and stayed consistent at level 4 on the T6 nustep. We will continue to monitor his progress in the program. Nicholas [name] continues to make improvements in rehab. He has been able to increase his number of laps in the hallway to 30!! He was also able to maintain level 4 on the T6 nustep. We will continue to monitor his progress in the program.   Expected Outcomes Short: Use RPE daily to regulate intensity. Long: Follow program prescription in THR. Short: Continue to follow current exercise prescription. Long: Continue exercise to improve strength and stamina. Short: Continue to push for more laps on the track. Long: Continue exercise to improve strength and  stamina. Short: Continue to progressively increase workloads. Long: Continue exercise to improve strength and stamina. Short: Continue to progressively increase workloads. Long: Continue exercise to improve strength and stamina.    Row Name 12/19/22 1524 12/26/22 1009 01/04/23 1436 01/18/23 0730 01/18/23 1006     Exercise Goal Re-Evaluation   Exercise Goals Review Increase Physical Activity;Increase Strength and Stamina;Understanding of Exercise Prescription Increase Physical Activity;Able to understand and use Dyspnea scale;Understanding of Exercise Prescription;Knowledge and understanding of Target Heart Rate Range (THRR);Increase Strength and Stamina;Able to understand and use rate of perceived exertion (RPE) scale;Able to check pulse independently Increase Physical Activity;Increase Strength and Stamina;Understanding of Exercise Prescription Increase Physical Activity;Increase Strength and Stamina;Understanding of Exercise Prescription Increase Physical Activity   Comments Nicholas [name] continues to do well in  rehab. He recently improved to level 5 on the T6 nustep and XR. He also continues to walk 30 laps on the track and use 3 lb hand weights for resistance training. We will continue to monitor his progress in the program. Reviewed home exercise with pt today from 9:50 to 10:02.  Pt plans to continue strength training with his 5 lb hand weights, as well as walking with his wife inside the mall or while running errands for exercise.  Reviewed THR, pulse, RPE, sign and symptoms, pulse oximetery and when to call 911 or MD.  Also discussed weather considerations and indoor options.  Pt voiced understanding. Nicholas [name] is doing well in the program. He continues to do well on both the XR and T6 nustep at level 5. He also continues to walk the hallway and has consistently reached 30 laps. He is doing well with 3 lb hand weights for resistance training as well. We will continue to monitor his progress in the program. Nicholas [name] continues to do well in the program. He continues to maintain level 5 on both the XR and T6 nustep, and 30 hallway laps in 15 minutes. We will continue to  encourage and monitor his progression in the program. Nicholas [name] is using his resistive weights at home at least 1-2 times a week.  He will walk some in his house, not as consistent with htis activity.   Expected Outcomes Short: Increase to 4 lb hand weights for resistance training. Long: Continue exercise to improve strength and stamina. Short: Implement and designate time at home for exercise. Long: Continue exercise to improve strength and stamina. Short: Continue to push for more laps during walking. Long: Continue exercise to improve strength and stamina. Short: Increase to level 6 on XR and T6 nustep. Long: Continue exercise to improve strength and stamina. STG more walking at home and continue with the hand weights for Home RX.  Continue with exercise progression in program  LTG maintain exercise progression after discahrge    Row Name 02/02/23 0856              Exercise Goal Re-Evaluation   Exercise Goals Review Increase Physical Activity;Increase Strength and Stamina;Understanding of Exercise Prescription       Comments Nicholas [name] is doing well and is cloe to graduating from the program. He recently completed his post and improved by 52.7%! He also improved to level 6 on the T6 nustep and XR. We will continue to monitor his progress untl he graduates from the program.       Expected Outcomes Short: Graduate. Long: Continue to exercise independently.                Discharge Exercise Prescription (Final Exercise Prescription  Changes):  Exercise Prescription Changes - 02/02/23 0800       Response to Exercise   Blood Pressure (Admit) 126/76    Blood Pressure (Exit) 114/62    Heart Rate (Admit) 72 bpm    Heart Rate (Exercise) 111 bpm    Heart Rate (Exit) 90 bpm    Oxygen Saturation (Admit) 95 %    Oxygen Saturation (Exercise) 91 %    Oxygen Saturation (Exit) 97 %    Rating of Perceived Exertion (Exercise) 12    Perceived Dyspnea (Exercise) 1    Symptoms none    Duration Continue with 30 min of aerobic exercise without signs/symptoms of physical distress.    Intensity THRR unchanged      Progression   Progression Continue to progress workloads to maintain intensity without signs/symptoms of physical distress.    Average METs 3.33      Resistance Training   Training Prescription Yes    Weight 5 lb    Reps 10-15      Interval Training   Interval Training No      NuStep   Level 6   T6 nustep   Minutes 15    METs 3.5      REL-XR   Level 6    Minutes 15    METs 4.9      Track   Laps 30   Hallway   Minutes 15    METs 2.31      Home Exercise Plan   Plans to continue exercise at Home (comment)   states that he will continue his use of hand weights as well as walking around with his wife while they run errands or go to doctors appointments.   Frequency Add 2 additional days to program exercise sessions.    Initial  Home Exercises Provided 12/26/22      Oxygen   Maintain Oxygen Saturation 88% or higher             Nutrition:  Target Goals: Understanding of nutrition guidelines, daily intake of sodium 1500mg , cholesterol 200mg , calories 30% from fat and 7% or less from saturated fats, daily to have 5 or more servings of fruits and vegetables.  Education: All About Nutrition: -Group instruction provided by verbal, written material, interactive activities, discussions, models, and posters to present general guidelines for heart healthy nutrition including fat, fiber, MyPlate, the role of sodium in heart healthy nutrition, utilization of the nutrition label, and utilization of this knowledge for meal planning. Follow up email sent as well. Written material given at graduation. Flowsheet Row Pulmonary Rehab from 12/07/2022 in [facility]  Date 12/07/22  Educator JG  Instruction Review Code 1- Verbalizes Understanding       Biometrics:  Pre Biometrics - 10/18/22 1248       Pre Biometrics   Height 5' 11.5" (1.816 m)    Weight 280 lb 6.4 oz (127.2 kg)    Waist Circumference 51 inches    Hip Circumference 53.5 inches    Waist to Hip Ratio 0.95 %    BMI (Calculated) 38.57    Single Leg Stand 1.6 seconds             Post Biometrics - 01/30/23 0942        Post  Biometrics   Height 5' 11.5" (1.816 m)    Weight 272 lb 9.6 oz (123.7 kg)    Waist Circumference 48.5 inches    Hip Circumference 51.5 inches  Waist to Hip Ratio 0.94 %    BMI (Calculated) 37.49    Single Leg Stand 2.96 seconds             Nutrition Therapy Plan and Nutrition Goals:  Nutrition Therapy & Goals - 10/18/22 1319       Nutrition Therapy   Diet Cardiac, Low Na    Protein (specify units) 75    Fiber 30 grams    Whole Grain Foods 3 servings    Saturated Fats 15 max. grams    Fruits and Vegetables 5 servings/day    Sodium 2 grams      Personal Nutrition Goals   Nutrition Goal  Drink 2 bottles of water daily    Personal Goal #2 Eat something nutrient/calorie dense at every meal hour    Personal Goal #3 Pair a complex carb with a protein or healthy fat    Comments Patient drinking ~20oz of water daily. Spoke about the importance of hydration and set short term goal of drinking 2 bottles per day (~32oz), with a long term goal of ~64oz daily. He reports that he likes to snack on sweets, but his appetite has been less than it used to be, often not missing a meal or getting full quickly. Reviewed mediterranean diet handout, educated on types of fats, sources, how to read food label. Educated on how healthy fats can be heart healthy and high in calories. Explain how choosing calorie dense foods such as peanut butter, olives, olives or avocado oils. Reviewed his 24hr food recall, explained how he should look to pair complex carbs with a protein or healthy fats when making meals or snacks. Brainstormed and built out a few example meals and snacks focusing on getting enough nutrition with his poor appetite.      Intervention Plan   Intervention Prescribe, educate and counsel regarding individualized specific dietary modifications aiming towards targeted core components such as weight, hypertension, lipid management, diabetes, heart failure and other comorbidities.;Nutrition handout(s) given to patient.    Expected Outcomes Short Term Goal: Understand basic principles of dietary content, such as calories, fat, sodium, cholesterol and nutrients.;Short Term Goal: A plan has been developed with personal nutrition goals set during dietitian appointment.;Long Term Goal: Adherence to prescribed nutrition plan.             Nutrition Assessments:  MEDIFICTS Score Key: >=70 Need to make dietary changes  40-70 Heart Healthy Diet <= 40 Therapeutic Level Cholesterol Diet  Flowsheet Row Pulmonary Rehab from 02/01/2023 in [facility]  Picture Your Plate Total Score on  Discharge 53      Picture Your Plate Scores: <40 Unhealthy dietary pattern with much room for improvement. 41-50 Dietary pattern unlikely to meet recommendations for good health and room for improvement. 51-60 More healthful dietary pattern, with some room for improvement.  >60 Healthy dietary pattern, although there may be some specific behaviors that could be improved.   Nutrition Goals Re-Evaluation:  Nutrition Goals Re-Evaluation     Row Name 11/09/22 [phone] 11/30/22 0943 01/02/23 0937 01/18/23 1015       Goals   Current Weight 276 lb (125.2 kg) -- -- --    Nutrition Goal -- -- -- Drink 2 bottles of water daily    Comment Patient was informed on why it is important to maintain a balanced diet when dealing with Respiratory issues. Explained that it takes a lot of energy to breath and when they are short of breath often they  will need to have a good diet to help keep up with the calories they are expending for breathing. Nicholas [name] states that he still is working on drinking plenty of water. He has been drinking a flavored water called Fruit2O. He also has been working on getting more protein as discussed with the RD. He has been eating more peanut butter with fruit as a snack to help with this. He also has not been adding salt at the table to help limit sodium intake. He is doing well with the water goal of 2 bottles per day,  set goat to aim for 2-2.5bottles per day. He is eating apples and peanut butter. He is doing better with protien, discussed protein shakes. his portions are smaller. Watching sodium intake when reading labels Goal #1 and #3.    Nicholas [name] continues to drink 2 or more bottles of water daily, he is eating apples with peanut butter.  His wife and he are watching the sodium intake as well as eating healthy fats, no fried foods.    Expected Outcome Short: Choose and plan snacks accordingly to patients caloric intake to improve breathing. Long: Maintain a diet independently that meets  their caloric intake to aid in daily shortness of breath. Short: Continue to get more protein and water. Long: Continue to practice healthy eating patterns discussed with RD. Short: drink 2-2.5bottles of water daily, try protein shakes. Long: COntinue to work on healthy eating patterns while meeting nutrition goals STG continue to work on the nutrition goals.  LTG maintaining healthy food choices after discharge      Personal Goal #3 Re-Evaluation   Personal Goal #3 -- -- -- Pair a complex carb with a protein or healthy fat             Nutrition Goals Discharge (Final Nutrition Goals Re-Evaluation):  Nutrition Goals Re-Evaluation - 01/18/23 1015       Goals   Nutrition Goal Drink 2 bottles of water daily    Comment Goal #1 and #3.    Nicholas [name] continues to drink 2 or more bottles of water daily, he is eating apples with peanut butter.  His wife and he are watching the sodium intake as well as eating healthy fats, no fried foods.    Expected Outcome STG continue to work on the nutrition goals.  LTG maintaining healthy food choices after discharge      Personal Goal #3 Re-Evaluation   Personal Goal #3 Pair a complex carb with a protein or healthy fat             Psychosocial: Target Goals: Acknowledge presence or absence of significant depression and/or stress, maximize coping skills, provide positive support system. Participant is able to verbalize types and ability to use techniques and skills needed for reducing stress and depression.   Education: Stress, Anxiety, and Depression - Group verbal and visual presentation to define topics covered.  Reviews how body is impacted by stress, anxiety, and depression.  Also discusses healthy ways to reduce stress and to treat/manage anxiety and depression.  Written material given at graduation. Flowsheet Row Pulmonary Rehab from 12/07/2022 in [facility]  Date 11/02/22  Educator SB  Instruction Review Code 1- [employer]  Understanding       Education: Sleep Hygiene -Provides group verbal and written instruction about how sleep can affect your health.  Define sleep hygiene, discuss sleep cycles and impact of sleep habits. Review good sleep hygiene tips.    Initial Review &  Psychosocial Screening:  Initial Psych Review & Screening - 10/09/22 1025       Initial Review   Current issues with Current Psychotropic Meds;Current Sleep Concerns      Family Dynamics   Good Support System? Yes    Comments Nicholas [name] has a problem with his son and is not on speaking. He can look to his wife as a great support system. His sleep has been doing well and takes medication.      Barriers   Psychosocial barriers to participate in program The patient should benefit from training in stress management and relaxation.      Screening Interventions   Interventions Encouraged to exercise;To provide support and resources with identified psychosocial needs;Provide feedback about the scores to participant    Expected Outcomes Short Term goal: Utilizing psychosocial counselor, staff and physician to assist with identification of specific Stressors or current issues interfering with healing process. Setting desired goal for each stressor or current issue identified.;Long Term Goal: Stressors or current issues are controlled or eliminated.;Short Term goal: Identification and review with participant of any Quality of Life or Depression concerns found by scoring the questionnaire.;Long Term goal: The participant improves quality of Life and PHQ9 Scores as seen by post scores and/or verbalization of changes             Quality of Life Scores:  Scores of 19 and below usually indicate a poorer quality of life in these areas.  A difference of  2-3 points is a clinically meaningful difference.  A difference of 2-3 points in the total score of the Quality of Life Index has been associated with significant improvement in overall quality of  life, self-image, physical symptoms, and general health in studies assessing change in quality of life.  PHQ-9: Review Flowsheet  More data exists      02/01/2023 12/12/2022 11/30/2022 11/09/2022 10/23/2022  Depression screen PHQ 2/9  Decreased Interest 0 0 0 0 1  Down, Depressed, Hopeless 0 0 0 1 1  PHQ - 2 Score 0 0 0 1 2  Altered sleeping 1 - 0 0 1  Tired, decreased energy 1 - 1 2 3   Change in appetite 0 - 0 1 1  Feeling bad or failure about yourself  0 - 0 1 1  Trouble concentrating 0 - 0 0 0  Moving slowly or fidgety/restless 0 - 0 0 1  Suicidal thoughts 0 - 0 0 0  PHQ-9 Score 2 - 1 5 9   Difficult doing work/chores Somewhat difficult - Somewhat difficult Not difficult at all Very difficult   Interpretation of Total Score  Total Score Depression Severity:  1-4 = Minimal depression, 5-9 = Mild depression, 10-14 = Moderate depression, 15-19 = Moderately severe depression, 20-27 = Severe depression   Psychosocial Evaluation and Intervention:  Psychosocial Evaluation - 10/09/22 1027       Psychosocial Evaluation & Interventions   Interventions Relaxation education;Encouraged to exercise with the program and follow exercise prescription;Stress management education    Comments Nicholas [name] has a problem with his son and is not on speaking. He can look to his wife as a great support system. His sleep has been doing well and takes medication.    Expected Outcomes Short: Start [facility] to help with mood. Long: Maintain a healthy mental state.    Continue Psychosocial Services  Follow up required by staff             Psychosocial Re-Evaluation:  Psychosocial Re-Evaluation  Row Name 11/09/22 0935 11/30/22 0940 01/02/23 0934 01/18/23 1011       Psychosocial Re-Evaluation   Current issues with Current Stress Concerns Current Stress Concerns None Identified None Identified    Comments Reviewed patient health questionnaire (PHQ-9) with patient for follow up. Previously, patients score  indicated signs/symptoms of depression.  Reviewed to see if patient is improving symptom wise while in program.  Score improved and patient states that it is because he has been able to workout and have a little more energy. Reviewed patient health questionnaire (PHQ-9) with patient for follow up. Previously, patients score indicated signs/symptoms of depression. Reviewed to see if patient is improving symptom wise while in program. Score improved as patient states that reading and exercise have been good stress relievers for him. He has some stress related to family issues with his son, but it is overall doing well. Patient reports no anxiety depression or stress. He reports has good support system. has his wife to help him with dealing with stress and stay positive mindset. Reports no sleep issues at this time. Knows the holiday season will be busy but generally enjoys the time of the year. Nicholas [name] is doing well, sleeping well and manageing his stress with exercise and reading.  "Reading takes me into another place." He continues to have long term family stress with a son that will not communicate with him and his wife. Nicholas [name] is managing this with living his life and taking measures to reduce any stress related symptoms. STG Nicholas [name] continues to manage any stress with his reading, exericse.  Will call MD for assistance if symptoms become enough to disturb his daily life.    Expected Outcomes Short: Continue to attend [facility] regularly for regular exercise and social engagement. Long: Continue to improve symptoms and manage a positive mental state. Short: Continue to attend [facility] regularly for regular exercise and social engagement. Long: Continue to maintain positive outlook. Short: continue to attend pulm rehab for exercise and social engagement. Long: Continue to use support system to maintain postive outlook STG continue to manage any stress with his reading and walking, attend all scheduled sessions. LTG  Continue with stress managment activities and talk with MD if techniques do not work.    Interventions Encouraged to attend Pulmonary Rehabilitation for the exercise Encouraged to attend Pulmonary Rehabilitation for the exercise Encouraged to attend Pulmonary Rehabilitation for the exercise Encouraged to attend Pulmonary Rehabilitation for the exercise    Continue Psychosocial Services  Follow up required by staff Follow up required by staff Follow up required by staff Follow up required by staff      Initial Review   Source of Stress Concerns -- Family -- --             Psychosocial Discharge (Final Psychosocial Re-Evaluation):  Psychosocial Re-Evaluation - 01/18/23 1011       Psychosocial Re-Evaluation   Current issues with None Identified    Comments Nicholas [name] is doing well, sleeping well and manageing his stress with exercise and reading.  "Reading takes me into another place." He continues to have long term family stress with a son that will not communicate with him and his wife. Nicholas [name] is managing this with living his life and taking measures to reduce any stress related symptoms. STG Nicholas [name] continues to manage any stress with his reading, exericse.  Will call MD for assistance if symptoms become enough to disturb his daily life.    Expected Outcomes STG continue to manage any stress  with his reading and walking, attend all scheduled sessions. LTG Continue with stress managment activities and talk with MD if techniques do not work.    Interventions Encouraged to attend Pulmonary Rehabilitation for the exercise    Continue Psychosocial Services  Follow up required by staff             Education: Education Goals: Education classes will be provided on a weekly basis, covering required topics. Participant will state understanding/return demonstration of topics presented.  Learning Barriers/Preferences:  Learning Barriers/Preferences - 10/09/22 1022       Learning  Barriers/Preferences   Learning Barriers None    Learning Preferences None             General Pulmonary Education Topics:  Infection Prevention: - Provides verbal and written material to individual with discussion of infection control including proper hand washing and proper equipment cleaning during exercise session. Flowsheet Row Pulmonary Rehab from 12/07/2022 in [facility] Cardiac and Pulmonary Rehab  Date 10/18/22  Educator [facility]  Instruction Review Code 1- Verbalizes Understanding       Falls Prevention: - Provides verbal and written material to individual with discussion of falls prevention and safety. Flowsheet Row Pulmonary Rehab from 12/07/2022 in [facility] Cardiac and Pulmonary Rehab  Date 10/18/22  Educator [facility]  Instruction Review Code 1- Verbalizes Understanding       Chronic Lung Disease Review: - Group verbal instruction with posters, models, PowerPoint presentations and videos,  to review new updates, new respiratory medications, new advancements in procedures and treatments. Providing information on websites and "800" numbers for continued self-education. Includes information about supplement oxygen, available portable oxygen systems, continuous and intermittent flow rates, oxygen safety, concentrators, and Medicare reimbursement for oxygen. Explanation of Pulmonary Drugs, including class, frequency, complications, importance of spacers, rinsing mouth after steroid MDI's, and proper cleaning methods for nebulizers. Review of basic lung anatomy and physiology related to function, structure, and complications of lung disease. Review of risk factors. Discussion about methods for diagnosing sleep apnea and types of masks and machines for OSA. Includes a review of the use of types of environmental controls: home humidity, furnaces, filters, dust mite/pet prevention, HEPA vacuums. Discussion about weather changes, air quality and the benefits of nasal washing. Instruction on Warning  signs, infection symptoms, calling MD promptly, preventive modes, and value of vaccinations. Review of effective airway clearance, coughing and/or vibration techniques. Emphasizing that all should Create an Action Plan. Written material given at graduation. Flowsheet Row Pulmonary Rehab from 12/07/2022 in [facility] Cardiac and Pulmonary Rehab  Education need identified 10/18/22  Date 10/19/22  Educator [facility]  Instruction Review Code 1- Verbalizes Understanding       AED/CPR: - Group verbal and written instruction with the use of models to demonstrate the basic use of the AED with the basic ABC's of resuscitation.    Anatomy and Cardiac Procedures: - Group verbal and visual presentation and models provide information about basic cardiac anatomy and function. Reviews the testing methods done to diagnose heart disease and the outcomes of the test results. Describes the treatment choices: Medical Management, Angioplasty, or Coronary Bypass Surgery for treating various heart conditions including Myocardial Infarction, Angina, Valve Disease, and Cardiac Arrhythmias.  Written material given at graduation.   Medication Safety: - Group verbal and visual instruction to review commonly prescribed medications for heart and lung disease. Reviews the medication, class of the drug, and side effects. Includes the steps to properly store meds and maintain the prescription regimen.  Written material given  at graduation.   Other: -Provides group and verbal instruction on various topics (see comments)   Knowledge Questionnaire Score:  Knowledge Questionnaire Score - 02/01/23 0947       Knowledge Questionnaire Score   Pre Score 15/18    Post Score 15/18              Core Components/Risk Factors/Patient Goals at Admission:  Personal Goals and Risk Factors at Admission - 10/18/22 1251       Core Components/Risk Factors/Patient Goals on Admission    Weight Management Yes;Weight Loss    Intervention  Weight Management: Develop a combined nutrition and exercise program designed to reach desired caloric intake, while maintaining appropriate intake of nutrient and fiber, sodium and fats, and appropriate energy expenditure required for the weight goal.;Weight Management: Provide education and appropriate resources to help participant work on and attain dietary goals.;Weight Management/Obesity: Establish reasonable short term and long term weight goals.;Obesity: Provide education and appropriate resources to help participant work on and attain dietary goals.    Admit Weight 280 lb 6.4 oz (127.2 kg)    Goal Weight: Short Term 270 lb (122.5 kg)    Goal Weight: Long Term 225 lb (102.1 kg)    Expected Outcomes Short Term: Continue to assess and modify interventions until short term weight is achieved;Long Term: Adherence to nutrition and physical activity/exercise program aimed toward attainment of established weight goal;Weight Loss: Understanding of general recommendations for a balanced deficit meal plan, which promotes 1-2 lb weight loss per week and includes a negative energy balance of [phone] kcal/d;Understanding recommendations for meals to include 15-35% energy as protein, 25-35% energy from fat, 35-60% energy from carbohydrates, less than 200mg  of dietary cholesterol, 20-35 gm of total fiber daily;Understanding of distribution of calorie intake throughout the day with the consumption of 4-5 meals/snacks    Improve shortness of breath with ADL's Yes    Intervention Provide education, individualized exercise plan and daily activity instruction to help decrease symptoms of SOB with activities of daily living.    Expected Outcomes Short Term: Improve cardiorespiratory fitness to achieve a reduction of symptoms when performing ADLs;Long Term: Be able to perform more ADLs without symptoms or delay the onset of symptoms    Diabetes Yes    Intervention Provide education about signs/symptoms and action to take  for hypo/hyperglycemia.;Provide education about proper nutrition, including hydration, and aerobic/resistive exercise prescription along with prescribed medications to achieve blood glucose in normal ranges: Fasting glucose 65-99 mg/dL    Expected Outcomes Short Term: Participant verbalizes understanding of the signs/symptoms and immediate care of hyper/hypoglycemia, proper foot care and importance of medication, aerobic/resistive exercise and nutrition plan for blood glucose control.;Long Term: Attainment of HbA1C < 7%.    Hypertension Yes    Intervention Provide education on lifestyle modifcations including regular physical activity/exercise, weight management, moderate sodium restriction and increased consumption of fresh fruit, vegetables, and low fat dairy, alcohol moderation, and smoking cessation.;Monitor prescription use compliance.    Expected Outcomes Short Term: Continued assessment and intervention until BP is < 140/2mm HG in hypertensive participants. < 130/47mm HG in hypertensive participants with diabetes, heart failure or chronic kidney disease.;Long Term: Maintenance of blood pressure at goal levels.    Lipids Yes    Intervention Provide education and support for participant on nutrition & aerobic/resistive exercise along with prescribed medications to achieve LDL 70mg , HDL >40mg .    Expected Outcomes Short Term: Participant states understanding of desired cholesterol values and is compliant with medications prescribed.  Participant is following exercise prescription and nutrition guidelines.;Long Term: Cholesterol controlled with medications as prescribed, with individualized exercise RX and with personalized nutrition plan. Value goals: LDL < 70mg , HDL > 40 mg.             Education:Diabetes - Individual verbal and written instruction to review signs/symptoms of diabetes, desired ranges of glucose level fasting, after meals and with exercise. Acknowledge that pre and post exercise  glucose checks will be done for 3 sessions at entry of program. Flowsheet Row Pulmonary Rehab from 12/07/2022 in [facility] Cardiac and Pulmonary Rehab  Date 10/18/22  Educator [facility]  Instruction Review Code 1- Verbalizes Understanding       Know Your Numbers and Heart Failure: - Group verbal and visual instruction to discuss disease risk factors for cardiac and pulmonary disease and treatment options.  Reviews associated critical values for Overweight/Obesity, Hypertension, Cholesterol, and Diabetes.  Discusses basics of heart failure: signs/symptoms and treatments.  Introduces Heart Failure Zone chart for action plan for heart failure.  Written material given at graduation. Flowsheet Row Pulmonary Rehab from 12/07/2022 in [facility] Cardiac and Pulmonary Rehab  Date 10/26/22  Educator [facility]  Instruction Review Code 1- Verbalizes Understanding       Core Components/Risk Factors/Patient Goals Review:   Goals and Risk Factor Review     Row Name 11/09/22 0930 11/30/22 0948 01/02/23 0941 01/18/23 1018       Core Components/Risk Factors/Patient Goals Review   Personal Goals Review Improve shortness of breath with ADL's Improve shortness of breath with ADL's;Diabetes;Weight Management/Obesity Improve shortness of breath with ADL's;Diabetes;Weight Management/Obesity Weight Management/Obesity;Diabetes;Lipids;Hypertension;Improve shortness of breath with ADL's    Review Spoke to patient about their shortness of breath and what they can do to improve. Patient has been informed of breathing techniques when starting the program. Patient is informed to tell staff if they have had any med changes and that certain meds they are taking or not taking can be causing shortness of breath. Nicholas [name] states that he would still like to lose some weight. He came in today weighing 276 lbs, and would like to lose down to 250 lb. He has stayed busy with taking care of his wife after her surgery and has not been checking his blood sugar  levels. He states that he does have a BS monitor, and will return to checking it routinely once his wife is feeling better. He recently saw his pulmonologist who encouraged him to continue diet and exercise to help with SOB. He reports he is losing some weight but not as much as he would like. did dicusss what steady progress looks like. Spoke about holiday season and his plan. He likes sweets and knows he will need to be mindful of them. With his wife recovering from shoulder surgery he has taken on new skills like cooking. He hasnt been checking his blood sugars as much. He has a monitor, set goal today to get back into routine of using the monitor. He feels he is mkaing progress with stamina and SOB. encouraged him to continue to work on strenght and breathing techiques her at [employer] is concerned he is not losing weight. He is losing inches. Reviewedwith him that inch loss is a healthy indicatator for weight loss.  Losing inches;fat, and gaining muscle mass will help him with his weight loss journey.  Will ask RD to chack in with him to see what nutrition changes may help. HIs blood pressure readings are good, he has improved  his cholseterol numbers [id number]  and HDL 42-50.  HIs latest A1C is 6.3%. He is compliant with his meds and is increasing laps walked in program.  He is able to walk 30 laps (100yd a lap) without shortness of breath.    Expected Outcomes Short: Attend [facility] regularly to improve shortness of breath with ADL's. Long: maintain independence with ADL's Short: Continue to work towards weight goal with diet and exercise. Long: Continue to manage lifestyle risk factors. Short: get back into routine of checking BS. Long: continue to manage lifestyle risk factors STG continue his exercise progression, nutrition changes and medication compliance  LTG continue with healthy lifestyle changes             Core Components/Risk Factors/Patient Goals at Discharge (Final Review):    Goals and Risk Factor Review - 01/18/23 1018       Core Components/Risk Factors/Patient Goals Review   Personal Goals Review Weight Management/Obesity;Diabetes;Lipids;Hypertension;Improve shortness of breath with ADL's    Review Nicholas [name] is concerned he is not losing weight. He is losing inches. Reviewedwith him that inch loss is a healthy indicatator for weight loss.  Losing inches;fat, and gaining muscle mass will help him with his weight loss journey.  Will ask RD to chack in with him to see what nutrition changes may help. HIs blood pressure readings are good, he has improved his cholseterol numbers [id number]  and HDL 42-50.  HIs latest A1C is 6.3%. He is compliant with his meds and is increasing laps walked in program.  He is able to walk 30 laps (100yd a lap) without shortness of breath.    Expected Outcomes STG continue his exercise progression, nutrition changes and medication compliance  LTG continue with healthy lifestyle changes             ITP Comments:  ITP Comments     Row Name 10/09/22 1024 10/18/22 1233 10/19/22 1007 10/25/22 1233 11/22/22 0848   ITP Comments Virtual Visit completed. Patient informed on EP and RD appointment and 6 Minute walk test. Patient also informed of patient health questionnaires on My Chart. Patient Verbalizes understanding. Visit diagnosis can be found in [facility] 09/18/2022. Completed and gym orientation. Initial ITP created and sent for review to Dr. Faud Aleskerov, Medical Director. First full day of exercise!  Patient was oriented to gym and equipment including functions, settings, policies, and procedures.  Patient's individual exercise prescription and treatment plan were reviewed.  All starting workloads were established based on the results of the 6 minute walk test done at initial orientation visit.  The plan for exercise progression was also introduced and progression will be customized based on patient's performance and goals. 30 Day review completed.  Medical Director ITP review done, changes made as directed, and signed approval by Medical Director.    new to program 30 Day review completed. Medical Director ITP review done, changes made as directed, and signed approval by Medical Director.    Row Name 12/13/22 0923 01/10/23 1141         ITP Comments 30 Day review completed. Medical Director ITP review done, changes made as directed, and signed approval by Medical Director. 30 Day review completed. Medical Director ITP review done, changes made as directed, and signed approval by Medical Director.               Comments: 30 day ITP.

## 2023-02-13 DIAGNOSIS — R0609 Other forms of dyspnea: Secondary | ICD-10-CM

## 2023-02-13 NOTE — Progress Notes (Signed)
Daily Session Note  Patient Details  Name: Nicholas [name] MRN: [id number] Date of Birth: 1945/05/21 Referring Provider:   Flowsheet Row Pulmonary Rehab from 10/18/2022 in [facility] Cardiac and Pulmonary Rehab  Referring Provider Dr. Birdena Jubilee       Encounter Date: 02/13/2023  Check In:  Session Check In - 02/13/23 0907       Check-In   Supervising physician immediately available to respond to emergencies See telemetry face sheet for immediately available ER MD    Location [facility]-Cardiac & Pulmonary Rehab    Staff Present Kelton Pillar RN,BSN,MPA;Margaret Best, MS, Exercise Physiologist;Maxon Conetta BS, Exercise Physiologist;Noah Tickle, BS, Exercise Physiologist    Virtual Visit No    Medication changes reported     No    Fall or balance concerns reported    No    Warm-up and Cool-down Performed on first and last piece of equipment    Resistance Training Performed Yes    VAD Patient? No    PAD/SET Patient? No      Pain Assessment   Currently in Pain? No/denies                Social History   Tobacco Use  Smoking Status Never  Smokeless Tobacco Never    Goals Met:  Independence with exercise equipment Exercise tolerated well No report of concerns or symptoms today Strength training completed today  Goals Unmet:  Not Applicable  Comments: Pt able to follow exercise prescription today without complaint.  Will continue to monitor for progression.    Dr. Bethann Punches is Medical Director for [facility] Cardiac Rehabilitation.  Dr. Vida Rigger is Medical Director for [facility] Pulmonary Rehabilitation.

## 2023-02-14 ENCOUNTER — Encounter: Payer: Medicare Other | Admitting: Cardiology

## 2023-02-16 ENCOUNTER — Encounter: Payer: Self-pay | Admitting: Cardiology

## 2023-02-16 ENCOUNTER — Ambulatory Visit: Payer: Medicare Other | Attending: Cardiology | Admitting: Cardiology

## 2023-02-16 VITALS — BP 128/70 | HR 72 | Ht 70.0 in | Wt 275.5 lb

## 2023-02-16 DIAGNOSIS — Z950 Presence of cardiac pacemaker: Secondary | ICD-10-CM

## 2023-02-16 DIAGNOSIS — I442 Atrioventricular block, complete: Secondary | ICD-10-CM | POA: Diagnosis not present

## 2023-02-16 DIAGNOSIS — I4819 Other persistent atrial fibrillation: Secondary | ICD-10-CM

## 2023-02-16 DIAGNOSIS — D6869 Other thrombophilia: Secondary | ICD-10-CM | POA: Diagnosis not present

## 2023-02-16 LAB — CUP PACEART INCLINIC DEVICE CHECK
Date Time Interrogation Session: 20250124163700
Implantable Lead Connection Status: 753985
Implantable Lead Connection Status: 753985
Implantable Lead Implant Date: 20180124
Implantable Lead Implant Date: 20180124
Implantable Lead Location: 753859
Implantable Lead Location: 753860
Implantable Lead Model: 3830
Implantable Lead Model: 5076
Implantable Pulse Generator Implant Date: 20180124

## 2023-02-16 NOTE — Progress Notes (Signed)
Electrophysiology Clinic Note    Date:  02/16/2023  Patient ID:  Nicholas [name], Granja [birth year], MRN [id number] PCP:  Marjie Skiff, NP  Cardiologist:  Sherryl Manges, MD Electrophysiologist: Lanier Prude, MD   Discussed the use of AI scribe software for clinical note transcription with the patient, who gave verbal consent to proceed.   Patient Profile    Chief Complaint: PPM device follow-up  History of Present Illness: UNNAMED Nicholas [name] is a 78 y.o. 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 Graciela Husbands for routine electrophysiology followup.   He is s/p RF AF ablation  with PVI 2022 by Dr. Lalla Brothers. He has a His bundle lead with chronically elevated thresholds. He last saw Dr. Graciela Husbands 10/2022, who recommended close follow-up d/t lead thresholds. He will likely need a new RV lead during generator replacement.   On follow-up today, he is doing well without complaints. He is somewhat anxious about his device nearing ERI. Continues to take eliquis BID, no bleeding concerns.    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 All other systems are reviewed and otherwise negative.    Physical Exam    VS:  BP 128/70 (BP Location: Left Arm, Patient Position: Sitting, Cuff Size: Normal)   Pulse 72   Ht 5\' 10"  (1.778 m)   Wt 275 lb 8 oz (125 kg)   SpO2 95%   BMI 39.53 kg/m  BMI: Body mass index is 39.53 kg/m.  Wt Readings from Last 3 Encounters:  02/16/23 275 lb 8 oz (125 kg)  01/30/23 272 lb 9.6 oz (123.7 kg)  01/12/23 276 lb 6.4 oz (125.4 kg)     GEN- The patient is well appearing, alert and oriented x 3 today.   Lungs- Clear to ausculation bilaterally, normal work of breathing.  Heart- Regular rate and  rhythm, no murmurs, rubs or gallops Extremities- Trace peripheral edema, warm, dry Skin-  device pocket well-healed, no tethering   Device interrogation done today and reviewed by myself:  Battery 3mon Lead thresholds, impedence, sensing stable  No episodes No changes made today   Studies Reviewed   Previous EP, cardiology notes.    EKG is ordered. Personal review of EKG from today shows:    EKG Interpretation Date/Time:  Friday February 16 2023 14:52:17 EST Ventricular Rate:  72 PR Interval:  202 QRS Duration:  116 QT Interval:  422 QTC Calculation: 462 R Axis:   -33  Text Interpretation: Atrial-sensed ventricular-paced rhythm Confirmed by Sherie Don [phone]) on 02/16/2023 3:13:36 PM     TTE, 04/06/2022  1. Left ventricular ejection fraction, by estimation, is 60 to 65%. Left ventricular ejection fraction by PLAX is 52 %. The left ventricle has normal function. The left ventricle has no regional wall motion abnormalities. There is mild left ventricular hypertrophy. Left ventricular diastolic parameters are indeterminate.   2. Right ventricular systolic function is normal. The right ventricular size is normal.   3. The mitral valve is normal in structure. No evidence of mitral valve regurgitation. No evidence of mitral stenosis.   4. The aortic valve is normal in structure. Aortic valve regurgitation is not visualized. Aortic valve sclerosis is present, with no  evidence of aortic valve stenosis.   5. There is mild dilatation of the ascending aorta, measuring 40 mm. There is borderline dilatation of the aortic root, measuring 39 mm.   6. The inferior vena cava is normal in size with greater than 50% respiratory variability, suggesting right atrial pressure of 3 mmHg.   Comparison(s): Previous Echo showed LV EF 60-65%, no RWMA, grade I diastolic dysfunction, aortic root 39mm, ascending aorta 41mm.      Assessment and Plan     #) CHB s/p PPM His bundle RV lead Chronically  elevated RV thresholds 3mon until ERI Briefly discussed lead extraction vs RV lead abandonment, will discuss with Dr. Graciela Husbands for final recs  #) persis afib S/p AF ablation with Dr. Lalla Brothers Maintaining sinus rhythm  #) Hypercoag d/t persis afib CHA2DS2-VASc Score = at least 4 [CHF History: 0, HTN History: 1, Diabetes History: 0, Stroke History: 0, Vascular Disease History: 1, Age Score: 2, Gender Score: 0].  Therefore, the patient's annual risk of stroke is 4.8 %.    Stroke ppx - 5mg  eliquis BID, appropriately dosed No bleeding concerns        Current medicines are reviewed at length with the patient today.   The patient does not have concerns regarding his medicines.  The following changes were made today:  none  Labs/ tests ordered today include:  Orders Placed This Encounter  Procedures   EKG 12-Lead     Disposition: Follow up with Dr. Graciela Husbands or EP APP  2 months    Signed, Sherie Don, NP  02/16/23  4:30 PM  Electrophysiology [facility]

## 2023-02-16 NOTE — Patient Instructions (Signed)
Medication Instructions:  The current medical regimen is effective;  continue present plan and medications.  *If you need a refill on your cardiac medications before your next appointment, please call your pharmacy*   Follow-Up: At [facility], you and your health needs are our priority.  As part of our continuing mission to provide you with exceptional heart care, we have created designated Provider Care Teams.  These Care Teams include your primary Cardiologist (physician) and Advanced Practice Providers (APPs -  Physician Assistants and Nurse Practitioners) who all work together to provide you with the care you need, when you need it.  We recommend signing up for the patient portal called "MyChart".  Sign up information is provided on this After Visit Summary.  MyChart is used to connect with patients for Virtual Visits (Telemedicine).  Patients are able to view lab/test results, encounter notes, upcoming appointments, etc.  Non-urgent messages can be sent to your provider as well.   To learn more about what you can do with MyChart, go to [email or web address].    Your next appointment:   2 month(s)  Provider:   Sherryl Manges, MD

## 2023-02-20 ENCOUNTER — Encounter: Payer: Medicare Other | Admitting: *Deleted

## 2023-02-20 DIAGNOSIS — R0609 Other forms of dyspnea: Secondary | ICD-10-CM | POA: Diagnosis not present

## 2023-02-20 NOTE — Progress Notes (Signed)
Daily Session Note  Patient Details  Name: Nicholas [name] MRN: [id number] Date of Birth: Jun 11, 1945 Referring Provider:   Flowsheet Row Pulmonary Rehab from 10/18/2022 in [facility] Cardiac and Pulmonary Rehab  Referring Provider Dr. Birdena Jubilee       Encounter Date: 02/20/2023  Check In:  Session Check In - 02/20/23 0919       Check-In   Supervising physician immediately available to respond to emergencies See telemetry face sheet for immediately available ER MD    Location [facility]-Cardiac & Pulmonary Rehab    Staff Present Rory Percy, MS, Exercise Physiologist;Tamaka Sawin, RN, BSN, CCRP;Maxon Conetta BS, Exercise Physiologist;Noah Tickle, BS, Exercise Physiologist    Virtual Visit No    Medication changes reported     No    Fall or balance concerns reported    No    Warm-up and Cool-down Performed on first and last piece of equipment    Resistance Training Performed Yes    VAD Patient? No    PAD/SET Patient? No      Pain Assessment   Currently in Pain? No/denies                Social History   Tobacco Use  Smoking Status Never  Smokeless Tobacco Never    Goals Met:  Proper associated with RPD/PD & O2 Sat Independence with exercise equipment Exercise tolerated well No report of concerns or symptoms today  Goals Unmet:  Not Applicable  Comments: Pt able to follow exercise prescription today without complaint.  Will continue to monitor for progression.    Dr. Bethann Punches is Medical Director for [facility] Cardiac Rehabilitation.  Dr. Vida Rigger is Medical Director for [facility] Pulmonary Rehabilitation.

## 2023-02-20 NOTE — Progress Notes (Signed)
Remote pacemaker transmission.

## 2023-02-20 NOTE — Addendum Note (Signed)
Addended by: Geralyn Flash D on: 02/20/2023 10:25 AM   Modules accepted: Orders, Level of Service

## 2023-02-21 ENCOUNTER — Ambulatory Visit: Payer: Medicare Other | Attending: Anesthesiology | Admitting: Anesthesiology

## 2023-02-21 ENCOUNTER — Encounter: Payer: Self-pay | Admitting: Anesthesiology

## 2023-02-21 ENCOUNTER — Telehealth: Payer: Self-pay | Admitting: Cardiology

## 2023-02-21 DIAGNOSIS — M47816 Spondylosis without myelopathy or radiculopathy, lumbar region: Secondary | ICD-10-CM

## 2023-02-21 DIAGNOSIS — M5431 Sciatica, right side: Secondary | ICD-10-CM | POA: Diagnosis not present

## 2023-02-21 DIAGNOSIS — M5432 Sciatica, left side: Secondary | ICD-10-CM

## 2023-02-21 DIAGNOSIS — M79642 Pain in left hand: Secondary | ICD-10-CM

## 2023-02-21 DIAGNOSIS — M47817 Spondylosis without myelopathy or radiculopathy, lumbosacral region: Secondary | ICD-10-CM

## 2023-02-21 DIAGNOSIS — M5416 Radiculopathy, lumbar region: Secondary | ICD-10-CM

## 2023-02-21 DIAGNOSIS — M79641 Pain in right hand: Secondary | ICD-10-CM

## 2023-02-21 DIAGNOSIS — M1611 Unilateral primary osteoarthritis, right hip: Secondary | ICD-10-CM

## 2023-02-21 DIAGNOSIS — G894 Chronic pain syndrome: Secondary | ICD-10-CM | POA: Diagnosis not present

## 2023-02-21 DIAGNOSIS — F1190 Opioid use, unspecified, uncomplicated: Secondary | ICD-10-CM

## 2023-02-21 MED ORDER — HYDROCODONE-ACETAMINOPHEN 5-325 MG PO TABS: 1.0000 | ORAL_TABLET | Freq: Two times a day (BID) | ORAL | 0 refills | Status: DC

## 2023-02-21 MED ORDER — HYDROCODONE-ACETAMINOPHEN 5-325 MG PO TABS: 2.0000 | ORAL_TABLET | Freq: Two times a day (BID) | ORAL | 0 refills | Status: DC

## 2023-02-21 NOTE — Telephone Encounter (Signed)
Per Dr. Lalla Brothers - scheduled for next week with him 2/5. Patient is aware.

## 2023-02-21 NOTE — Telephone Encounter (Signed)
I called patient to follow-up after our visit last week regarding his RV pacemaker lead.  In discussion with Dr. Graciela Husbands, the recommendation would be to have lead extraction, if appropriate from a risk/benefit perspective. Dr. Graciela Husbands does not perform this procedure, so recommend the patient discuss with one of our extraction Mds to further discuss risks/benefits.   Patient agreeable to this.

## 2023-02-22 ENCOUNTER — Encounter: Payer: Medicare Other | Admitting: *Deleted

## 2023-02-22 DIAGNOSIS — R0609 Other forms of dyspnea: Secondary | ICD-10-CM

## 2023-02-22 NOTE — Progress Notes (Signed)
Virtual Visit via Telephone Note  I connected with Nicholas [name] on 02/22/23 at 11:20 AM EST by telephone and verified that I am speaking with the correct person using two identifiers.  Location: Patient: Home Provider: Pain control center   I discussed the limitations, risks, security and privacy concerns of performing an evaluation and management service by telephone and the availability of in person appointments. I also discussed with the patient that there may be a patient responsible charge related to this service. The patient expressed understanding and agreed to proceed.   History of Present Illness: I was able to reach out to Nicholas [name] for our video conference.  We were able to do this via telephone as we could not connect for video portion of this.  He is doing well as of recent.  Doing better with her current regimen on opioid medication helping with the low back pain and leg pain.  No change in the quality characteristic or distribution of the pain is noted at this time.  He still getting pain in the hands right hip and bilateral lower extremities with chronic low back pain.  He is using his medications as prescribed and these continue to give him good relief throughout the day.  He generally gets about 75% improvement with the opioid medications lasting about 4 to 6 hours before he gets recurrence of his baseline pain.  No side effects reported.  Otherwise in his usual state of health..  No change in lower extremity strength function bowel or bladder function is noted.  Review of systems: General: No fevers or chills Pulmonary: No shortness of breath or dyspnea Cardiac: No angina or palpitations or lightheadedness GI: No abdominal pain or constipation Psych: No depression    Observations/Objective:  Current Outpatient Medications:    [START ON 03/23/2023] HYDROcodone-acetaminophen (NORCO/VICODIN) 5-325 MG tablet, Take 1 tablet by mouth 2 (two) times daily., Disp: 60 tablet,  Rfl: 0   acetaminophen (TYLENOL) 650 MG CR tablet, Take 650 mg by mouth every 8 (eight) hours as needed for pain., Disp: , Rfl:    albuterol (VENTOLIN HFA) 108 (90 Base) MCG/ACT inhaler, INHALE 2 PUFFS BY MOUTH EVERY 6 HOURS AS NEEDED, Disp: 6.7 each, Rfl: 2   apixaban (ELIQUIS) 5 MG TABS tablet, Take 1 tablet (5 mg total) by mouth 2 (two) times daily., Disp: 180 tablet, Rfl: 1   Ascorbic Acid (VITAMIN C) 1000 MG tablet, Take 1,000 mg by mouth daily., Disp: , Rfl:    Blood Glucose Monitoring Suppl (ONETOUCH VERIO) w/Device KIT, Use to check blood sugar 3 times a day and document results, bring to appointments.  Goal is <130 fasting blood sugar and <180 two hours after meals., Disp: 1 kit, Rfl: 0   calcium-vitamin D (OSCAL WITH D) 500-5 MG-MCG tablet, Take 1 tablet by mouth daily with breakfast., Disp: , Rfl:    Cranberry 500 MG CAPS, Take 500 mg by mouth daily., Disp: , Rfl:    empagliflozin (JARDIANCE) 25 MG TABS tablet, Take 1 tablet (25 mg total) by mouth daily., Disp: 30 tablet, Rfl: 4   gabapentin (NEURONTIN) 600 MG tablet, TAKE 1 TABLET BY MOUTH EVERYDAY AT BEDTIME, Disp: 90 tablet, Rfl: 4   glucose blood test strip, Use to check blood sugar 3 times a day and document results, bring to appointments.  Goal is <130 fasting blood sugar and <180 two hours after meals., Disp: 100 each, Rfl: 12   HYDROcodone-acetaminophen (NORCO/VICODIN) 5-325 MG tablet, Take 2 tablets by mouth  in the morning and at bedtime., Disp: 60 tablet, Rfl: 0   Lancets (ONETOUCH ULTRASOFT) lancets, Use to check blood sugar 3 times a day and document results, bring to appointments.  Goal is <130 fasting blood sugar and <180 two hours after meals., Disp: 100 each, Rfl: 12   LORazepam (ATIVAN) 1 MG tablet, TAKE 1 TABLET BY MOUTH EVERYDAY AT BEDTIME, Disp: 90 tablet, Rfl: 0   Magnesium 400 MG TABS, Take 400 mg by mouth daily. , Disp: , Rfl:    MELATONIN PO, Take by mouth., Disp: , Rfl:    midodrine (PROAMATINE) 5 MG tablet, Take 1  tablet (5 mg total) by mouth 3 (three) times daily with meals., Disp: 270 tablet, Rfl: 3   Multiple Vitamin (MULTIVITAMIN WITH MINERALS) TABS tablet, Take 1 tablet by mouth daily. Centrum Silver, Disp: , Rfl:    nystatin (MYCOSTATIN/NYSTOP) powder, Apply 1 Application topically 3 (three) times daily., Disp: 60 g, Rfl: 2   nystatin cream (MYCOSTATIN), Apply 1 Application topically 2 (two) times daily., Disp: 30 g, Rfl: 4   Omega-3 Fatty Acids (FISH OIL) 1200 MG CAPS, Take 1,200 mg by mouth daily., Disp: , Rfl:    oxymetazoline (AFRIN) 0.05 % nasal spray, Place 2 sprays into both nostrils at bedtime., Disp: , Rfl:    pantoprazole (PROTONIX) 20 MG tablet, TAKE 1 TABLET (20 MG TOTAL) BY MOUTH 2 (TWO) TIMES DAILY BEFORE A MEAL. (Patient taking differently: Take 20 mg by mouth daily.), Disp: 180 tablet, Rfl: 2   potassium chloride SA (KLOR-CON M) 20 MEQ tablet, Take 2 tablets (40 meq) with Torsemide, Disp: 30 tablet, Rfl: 3   QUEtiapine Fumarate (SEROQUEL XR) 150 MG 24 hr tablet, TAKE 1 TABLET BY MOUTH EVERYDAY AT BEDTIME, Disp: 90 tablet, Rfl: 4   rOPINIRole (REQUIP) 0.25 MG tablet, Take 0.25 mg by mouth 4 (four) times daily., Disp: , Rfl:    rosuvastatin (CRESTOR) 40 MG tablet, TAKE 1 TABLET BY MOUTH EVERY DAY, Disp: 90 tablet, Rfl: 1   solifenacin (VESICARE) 10 MG tablet, TAKE 1 TABLET BY MOUTH EVERY DAY, Disp: 90 tablet, Rfl: 0   tiZANidine (ZANAFLEX) 4 MG tablet, Take 1 tablet (4 mg total) by mouth every 6 (six) hours as needed for muscle spasms., Disp: 90 tablet, Rfl: 4   torsemide (DEMADEX) 20 MG tablet, TAKE 2 TABLETS (40 MG TOTAL) BY MOUTH DAILY AS NEEDED (FLUID RETENTION)., Disp: 90 tablet, Rfl: 0   trimethoprim (TRIMPEX) 100 MG tablet, Take 1 tablet (100 mg total) by mouth daily., Disp: 30 tablet, Rfl: 3   venlafaxine XR (EFFEXOR-XR) 75 MG 24 hr capsule, TAKE 3 CAPSULES BY MOUTH EVERY DAY, Disp: 270 capsule, Rfl: 0   Past Medical History:  Diagnosis Date   Abdominal pain, chronic, right  lower quadrant 12/13/2020   Anterior urethral stricture    Anxiety    Arthritis    a. knees, hips, hands;  b. 11/2013 s/p L TKA @ [facility].   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 Dr Graciela Husbands   Depression    DVT (deep venous thrombosis) (HCC)    Erosive esophagitis    Gross hematuria    Hyperlipemia    Hypertension    borderline   Internal hemorrhoids    Phimosis    Pulmonary embolism (HCC)    Assessment and Plan:  1. Chronic  pain syndrome   2. Lumbar radiculopathy   3. Bilateral sciatica   4. Chronic, continuous use of opioids   5. Bilateral hand pain   6. Facet syndrome, lumbar   7. Osteoarthritis of right hip, unspecified osteoarthritis type   8. Facet arthritis of lumbosacral region    Patient our conversation I gets appropriate to refill his medicines for the next 2 months dated for January 25 and February 24.  No other changes will be initiated in his pharmacologic regimen.  Continue stretching and strengthening efforts and increase ambulation as the warmer weather comes.  Will defer on any repeat injections at this time.  Continue follow-up with his primary care physicians for baseline medical care.  Return to clinic in 2 months. Follow Up Instructions:    I discussed the assessment and treatment plan with the patient. The patient was provided an opportunity to ask questions and all were answered. The patient agreed with the plan and demonstrated an understanding of the instructions.   The patient was advised to call back or seek an in-person evaluation if the symptoms worsen or if the condition fails to improve as anticipated.  I provided 30 minutes of non-face-to-face time during this encounter.   Yevette Edwards, MD

## 2023-02-22 NOTE — Progress Notes (Signed)
Daily Session Note  Patient Details  Name: Nicholas [name] MRN: [id number] Date of Birth: 1946-01-11 Referring Provider:   Flowsheet Row Pulmonary Rehab from 10/18/2022 in [facility] Cardiac and Pulmonary Rehab  Referring Provider Dr. Birdena Jubilee       Encounter Date: 02/22/2023  Check In:  Session Check In - 02/22/23 0931       Check-In   Supervising physician immediately available to respond to emergencies See telemetry face sheet for immediately available ER MD    Location [facility]-Cardiac & Pulmonary Rehab    Staff Present Cora Collum, RN, BSN, CCRP;Maxon Conetta BS, Exercise Physiologist;Joseph Hood RCP,RRT,BSRT;Noah Tickle, [place], Exercise Physiologist    Virtual Visit No    Medication changes reported     No    Fall or balance concerns reported    No    Warm-up and Cool-down Performed on first and last piece of equipment    Resistance Training Performed Yes    VAD Patient? No    PAD/SET Patient? No      Pain Assessment   Currently in Pain? No/denies                Social History   Tobacco Use  Smoking Status Never  Smokeless Tobacco Never    Goals Met:  Proper associated with RPD/PD & O2 Sat Independence with exercise equipment Exercise tolerated well No report of concerns or symptoms today  Goals Unmet:  Not Applicable  Comments: Pt able to follow exercise prescription today without complaint.  Will continue to monitor for progression.    Dr. Bethann Punches is Medical Director for [facility] Cardiac Rehabilitation.  Dr. Vida Rigger is Medical Director for [facility] Pulmonary Rehabilitation.

## 2023-02-27 NOTE — Progress Notes (Signed)
  Electrophysiology Office Follow up Visit Note:    Date:  02/28/2023   ID:  Nicholas [name], DOB 1945/05/12, MRN [id number]  PCP:  Marjie Skiff, NP  [facility] Cardiologist:  Sherryl Manges, MD  [facility] Electrophysiologist:  Lanier Prude, MD    Interval History:     Nicholas [name] is a 78 y.o. male who presents for a follow up visit.   He last saw Suzann 02/16/2023. Follows with Dr Graciela Husbands.  Has a history of CHB s/p PPM, persistent AF, orthostatic hypotension, PE, OSA on CPAP.  For his AF, had a PVI 2022 by me.  He is with his wife today who have previously met.        Past medical, surgical, social and family history were reviewed.  ROS:   Please see the history of present illness.    All other systems reviewed and are negative.  EKGs/Labs/Other Studies Reviewed:    The following studies were reviewed today:  02/28/2023 In clinic device interrogation personally reviewed Implanted 01/2016, medtronic dual chamber system His bundle area lead Remaining longevity estimated at 3 months Threshold today on the RV lead is 2.75 at 1 Sensing on the RV lead is 1.1 Atrial lead parameters are stable 100% V pacing  04/07/2022 Echo EF 60-65% RV normal No MR        Physical Exam:    VS:  BP 122/68   Pulse 86   Ht 5\' 11"  (1.803 m)   Wt 273 lb 6.4 oz (124 kg)   SpO2 95%   BMI 38.13 kg/m     Wt Readings from Last 3 Encounters:  02/28/23 273 lb 6.4 oz (124 kg)  02/16/23 275 lb 8 oz (125 kg)  01/30/23 272 lb 9.6 oz (123.7 kg)     GEN: no distress CARD: RRR, No MRG. PPM pocket well healed RESP: No IWOB. CTAB.      ASSESSMENT:    1. Persistent atrial fibrillation (HCC)   2. Complete heart block (HCC)   3. Cardiac pacemaker in situ   4. Failure of pacemaker lead, initial encounter    PLAN:    In order of problems listed above:  #CHB #PPM in situ #RV lead exit block Progressively worsening exit block. He is dependent. I discussed strategies for  RV lead management including implant of new lead with abandonment of old lead vs extraction/replacement. He would like to have his old lead extracted and implant a new lead.  Risks, benefits, alternatives to PPM extraction/implantation were discussed in detail with the patient today. The patient understands that the risks include but are not limited to bleeding, infection, pneumothorax, perforation, tamponade, vascular damage, renal failure, MI, stroke, death, life-threatening bleeding requiring emergent open heart surgery and lead dislodgement and wishes to proceed.  We will therefore schedule device implantation at the next available time.  Plan for CT lead extraction protocol prior to the procedure for risk stratification  #AF AF burden <0.1%. On OAC. Hold OAC for 3 days prior to surgery. Risks/benefits discussed.   Signed, Steffanie Dunn, MD, [facility], [facility] 02/28/2023 8:32 AM    Electrophysiology [facility]

## 2023-02-27 NOTE — H&P (View-Only) (Signed)
  Electrophysiology Office Follow up Visit Note:    Date:  02/28/2023   ID:  Nicholas [name], DOB 1945/10/15, MRN [id number]  PCP:  Valerio Melanie ONEIDA, NP  [facility] Cardiologist:  Elspeth Sage, MD  [facility] Electrophysiologist:  OLE ONEIDA HOLTS, MD    Interval History:     Nicholas [name] is a 78 y.o. male who presents for a follow up visit.   He last saw Suzann 02/16/2023. Follows with Dr Sage.  Has a history of CHB s/p PPM, persistent AF, orthostatic hypotension, PE, OSA on CPAP.  For his AF, had a PVI 2022 by me.  He is with his wife today who have previously met.        Past medical, surgical, social and family history were reviewed.  ROS:   Please see the history of present illness.    All other systems reviewed and are negative.  EKGs/Labs/Other Studies Reviewed:    The following studies were reviewed today:  02/28/2023 In clinic device interrogation personally reviewed Implanted 01/2016, medtronic dual chamber system His bundle area lead Remaining longevity estimated at 3 months Threshold today on the RV lead is 2.75 at 1 Sensing on the RV lead is 1.1 Atrial lead parameters are stable 100% V pacing  04/07/2022 Echo EF 60-65% RV normal No MR        Physical Exam:    VS:  BP 122/68   Pulse 86   Ht 5' 11 (1.803 m)   Wt 273 lb 6.4 oz (124 kg)   SpO2 95%   BMI 38.13 kg/m     Wt Readings from Last 3 Encounters:  02/28/23 273 lb 6.4 oz (124 kg)  02/16/23 275 lb 8 oz (125 kg)  01/30/23 272 lb 9.6 oz (123.7 kg)     GEN: no distress CARD: RRR, No MRG. PPM pocket well healed RESP: No IWOB. CTAB.      ASSESSMENT:    1. Persistent atrial fibrillation (HCC)   2. Complete heart block (HCC)   3. Cardiac pacemaker in situ   4. Failure of pacemaker lead, initial encounter    PLAN:    In order of problems listed above:  #CHB #PPM in situ #RV lead exit block Progressively worsening exit block. He is dependent. I discussed strategies for  RV lead management including implant of new lead with abandonment of old lead vs extraction/replacement. He would like to have his old lead extracted and implant a new lead.  Risks, benefits, alternatives to PPM extraction/implantation were discussed in detail with the patient today. The patient understands that the risks include but are not limited to bleeding, infection, pneumothorax, perforation, tamponade, vascular damage, renal failure, MI, stroke, death, life-threatening bleeding requiring emergent open heart surgery and lead dislodgement and wishes to proceed.  We will therefore schedule device implantation at the next available time.  Plan for CT lead extraction protocol prior to the procedure for risk stratification  #AF AF burden <0.1%. On OAC. Hold OAC for 3 days prior to surgery. Risks/benefits discussed.   Signed, Ole Holts, MD, [facility], [facility] 02/28/2023 8:32 AM    Electrophysiology [facility]

## 2023-02-28 ENCOUNTER — Ambulatory Visit: Payer: Medicare Other | Attending: Cardiology | Admitting: Cardiology

## 2023-02-28 VITALS — BP 122/68 | HR 86 | Ht 71.0 in | Wt 273.4 lb

## 2023-02-28 DIAGNOSIS — T82110A Breakdown (mechanical) of cardiac electrode, initial encounter: Secondary | ICD-10-CM

## 2023-02-28 DIAGNOSIS — I442 Atrioventricular block, complete: Secondary | ICD-10-CM

## 2023-02-28 DIAGNOSIS — I4819 Other persistent atrial fibrillation: Secondary | ICD-10-CM

## 2023-02-28 DIAGNOSIS — Z950 Presence of cardiac pacemaker: Secondary | ICD-10-CM

## 2023-02-28 LAB — CBC WITH DIFFERENTIAL/PLATELET

## 2023-02-28 NOTE — Patient Instructions (Addendum)
 Medication Instructions:  Your physician recommends that you continue on your current medications as directed. Please refer to the Current Medication list given to you today.  *If you need a refill on your cardiac medications before your next appointment, please call your pharmacy*  Lab Work: TODAY: BMET and CBC If you have labs (blood work) drawn today and your tests are completely normal, you will receive your results only by: MyChart Message (if you have MyChart) OR A paper copy in the mail If you have any lab test that is abnormal or we need to change your treatment, we will call you to review the results.  Testing/Procedures: CT scan - you will be called to schedule this test - see instruction letter  Lead Extraction - Monday, February 17th - see instruction letter   Follow-Up: At [facility], you and your health needs are our priority.  As part of our continuing mission to provide you with exceptional heart care, we have created designated Provider Care Teams.  These Care Teams include your primary Cardiologist (physician) and Advanced Practice Providers (APPs -  Physician Assistants and Nurse Practitioners) who all work together to provide you with the care you need, when you need it.   Your next appointment:   We will call you to schedule your follow up appointments

## 2023-03-01 LAB — CBC WITH DIFFERENTIAL/PLATELET
Basos: 1 %
EOS (ABSOLUTE): 0.1 10*3/uL (ref 0.0–0.2)
Eos: 4 %
Hematocrit: 43.4 % (ref 37.5–51.0)
Hemoglobin: 14.7 g/dL (ref 13.0–17.7)
Immature Granulocytes: 0 10*3/uL (ref 0.0–0.1)
Immature Granulocytes: 1 %
Lymphs: 18 %
MCH: 31.5 pg (ref 26.6–33.0)
MCHC: 33.9 g/dL (ref 31.5–35.7)
MCV: 93 fL (ref 79–97)
Monocytes Absolute: 0.3 10*3/uL (ref 0.0–0.4)
Monocytes Absolute: 0.6 10*3/uL (ref 0.1–0.9)
Monocytes: 8 %
Neutrophils Absolute: 1.4 10*3/uL (ref 0.7–3.1)
Neutrophils Absolute: 5.1 10*3/uL (ref 1.4–7.0)
Neutrophils: 68 %
Platelets: 175 10*3/uL (ref 150–450)
RBC: 4.67 x10E6/uL (ref 4.14–5.80)
RDW: 12.5 % (ref 11.6–15.4)
WBC: 7.5 10*3/uL (ref 3.4–10.8)

## 2023-03-01 LAB — BASIC METABOLIC PANEL
BUN/Creatinine Ratio: 18 (ref 10–24)
BUN: 24 mg/dL (ref 8–27)
CO2: 23 mmol/L (ref 20–29)
Calcium: 9.8 mg/dL (ref 8.6–10.2)
Chloride: 100 mmol/L (ref 96–106)
Creatinine, Ser: 1.34 mg/dL — ABNORMAL HIGH (ref 0.76–1.27)
Glucose: 147 mg/dL — ABNORMAL HIGH (ref 70–99)
Potassium: 5 mmol/L (ref 3.5–5.2)
Sodium: 141 mmol/L (ref 134–144)
eGFR: 55 mL/min/{1.73_m2} — ABNORMAL LOW (ref >59)

## 2023-03-07 NOTE — Progress Notes (Signed)
 Pulmonary Individual Treatment Plan  Patient Details  Name: Nicholas [name] MRN: [id number] Date of Birth: 1945-09-09 Referring Provider:   Flowsheet Row Pulmonary Rehab from 10/18/2022 in [facility] Cardiac and Pulmonary Rehab  Referring Provider Dr. Birdena Jubilee       Initial Encounter Date:  Flowsheet Row Pulmonary Rehab from 10/18/2022 in [facility] Cardiac and Pulmonary Rehab  Date 10/18/22       Visit Diagnosis: DOE (dyspnea on exertion)  Patient's Home Medications on Admission:  Current Outpatient Medications:    acetaminophen (TYLENOL) 650 MG CR tablet, Take 650 mg by mouth every 8 (eight) hours as needed for pain., Disp: , Rfl:    albuterol (VENTOLIN HFA) 108 (90 Base) MCG/ACT inhaler, INHALE 2 PUFFS BY MOUTH EVERY 6 HOURS AS NEEDED, Disp: 6.7 each, Rfl: 2   apixaban (ELIQUIS) 5 MG TABS tablet, Take 1 tablet (5 mg total) by mouth 2 (two) times daily., Disp: 180 tablet, Rfl: 1   Ascorbic Acid (VITAMIN C) 1000 MG tablet, Take 1,000 mg by mouth daily., Disp: , Rfl:    Blood Glucose Monitoring Suppl (ONETOUCH VERIO) w/Device KIT, Use to check blood sugar 3 times a day and document results, bring to appointments.  Goal is <130 fasting blood sugar and <180 two hours after meals., Disp: 1 kit, Rfl: 0   calcium-vitamin D (OSCAL WITH D) 500-5 MG-MCG tablet, Take 1 tablet by mouth daily with breakfast., Disp: , Rfl:    Cranberry 500 MG CAPS, Take 500 mg by mouth daily., Disp: , Rfl:    empagliflozin (JARDIANCE) 25 MG TABS tablet, Take 1 tablet (25 mg total) by mouth daily., Disp: 30 tablet, Rfl: 4   gabapentin (NEURONTIN) 600 MG tablet, TAKE 1 TABLET BY MOUTH EVERYDAY AT BEDTIME, Disp: 90 tablet, Rfl: 4   glucose blood test strip, Use to check blood sugar 3 times a day and document results, bring to appointments.  Goal is <130 fasting blood sugar and <180 two hours after meals., Disp: 100 each, Rfl: 12   [START ON 03/23/2023] HYDROcodone-acetaminophen (NORCO/VICODIN) 5-325 MG tablet, Take 1  tablet by mouth 2 (two) times daily., Disp: 60 tablet, Rfl: 0   Lancets (ONETOUCH ULTRASOFT) lancets, Use to check blood sugar 3 times a day and document results, bring to appointments.  Goal is <130 fasting blood sugar and <180 two hours after meals., Disp: 100 each, Rfl: 12   LORazepam (ATIVAN) 1 MG tablet, TAKE 1 TABLET BY MOUTH EVERYDAY AT BEDTIME, Disp: 90 tablet, Rfl: 0   Magnesium 400 MG TABS, Take 400 mg by mouth daily. , Disp: , Rfl:    MELATONIN PO, Take 10 mg by mouth at bedtime., Disp: , Rfl:    midodrine (PROAMATINE) 5 MG tablet, Take 1 tablet (5 mg total) by mouth 3 (three) times daily with meals., Disp: 270 tablet, Rfl: 3   Multiple Vitamin (MULTIVITAMIN WITH MINERALS) TABS tablet, Take 1 tablet by mouth daily. Centrum Silver, Disp: , Rfl:    NON FORMULARY, Pt uses a cpap nightly, Disp: , Rfl:    nystatin (MYCOSTATIN/NYSTOP) powder, Apply 1 Application topically 3 (three) times daily. (Patient not taking: Reported on 03/06/2023), Disp: 60 g, Rfl: 2   nystatin cream (MYCOSTATIN), Apply 1 Application topically 2 (two) times daily., Disp: 30 g, Rfl: 4   Omega-3 Fatty Acids (FISH OIL) 1200 MG CAPS, Take 1,200 mg by mouth daily., Disp: , Rfl:    oxymetazoline (AFRIN) 0.05 % nasal spray, Place 2 sprays into both nostrils at bedtime., Disp: ,  Rfl:    pantoprazole (PROTONIX) 20 MG tablet, TAKE 1 TABLET (20 MG TOTAL) BY MOUTH 2 (TWO) TIMES DAILY BEFORE A MEAL. (Patient taking differently: Take 20 mg by mouth daily.), Disp: 180 tablet, Rfl: 2   potassium chloride SA (KLOR-CON M) 20 MEQ tablet, Take 2 tablets (40 meq) with Torsemide (Patient taking differently: Take 40 mEq by mouth daily as needed (when taking torsemide).), Disp: 30 tablet, Rfl: 3   QUEtiapine Fumarate (SEROQUEL XR) 150 MG 24 hr tablet, TAKE 1 TABLET BY MOUTH EVERYDAY AT BEDTIME, Disp: 90 tablet, Rfl: 4   rOPINIRole (REQUIP) 0.25 MG tablet, Take 0.25 mg by mouth 4 (four) times daily., Disp: , Rfl:    rosuvastatin (CRESTOR) 40 MG  tablet, TAKE 1 TABLET BY MOUTH EVERY DAY, Disp: 90 tablet, Rfl: 1   solifenacin (VESICARE) 10 MG tablet, TAKE 1 TABLET BY MOUTH EVERY DAY, Disp: 90 tablet, Rfl: 0   tiZANidine (ZANAFLEX) 4 MG tablet, Take 1 tablet (4 mg total) by mouth every 6 (six) hours as needed for muscle spasms., Disp: 90 tablet, Rfl: 4   torsemide (DEMADEX) 20 MG tablet, TAKE 2 TABLETS (40 MG TOTAL) BY MOUTH DAILY AS NEEDED (FLUID RETENTION)., Disp: 90 tablet, Rfl: 0   trimethoprim (TRIMPEX) 100 MG tablet, Take 1 tablet (100 mg total) by mouth daily., Disp: 30 tablet, Rfl: 3   venlafaxine XR (EFFEXOR-XR) 75 MG 24 hr capsule, TAKE 3 CAPSULES BY MOUTH EVERY DAY, Disp: 270 capsule, Rfl: 0  Past Medical History: Past Medical History:  Diagnosis Date   Abdominal pain, chronic, right lower quadrant 12/13/2020   Anterior urethral stricture    Anxiety    Arthritis    a. knees, hips, hands;  b. 11/2013 s/p L TKA @ [facility].   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 Dr Graciela Husbands   Depression    DVT (deep venous thrombosis) (HCC)    Erosive esophagitis    Gross hematuria    Hyperlipemia    Hypertension    borderline   Internal hemorrhoids    Phimosis    Pulmonary embolism (HCC)     Tobacco Use: Social History   Tobacco Use  Smoking Status Never  Smokeless Tobacco Never    Labs: Review Flowsheet  More data exists      Latest Ref Rng & Units 11/29/2021 03/01/2022 05/30/2022 10/02/2022 01/01/2023  Labs for ITP Cardiac and Pulmonary Rehab  Cholestrol 100 - 199 mg/dL 161  096  045  409  811   LDL (calc) 0 - 99 mg/dL 63  58  59  914  63   HDL-C >39 mg/dL 49  58  49  42  50   Trlycerides 0 - 149 mg/dL 782  97  956  213  95   Hemoglobin A1c 4.8 - 5.6 % 6.8  6.8  7.0  7.0  6.3      Pulmonary Assessment Scores:  Pulmonary Assessment Scores     Row Name 10/18/22 1252 02/01/23 0947       ADL  UCSD   ADL Phase Entry Exit    SOB Score total 89 30    Rest 0 0    Walk 5 1    Stairs 5 3    Bath 5 2    Dress 4 0    Shop 5 1  CAT Score   CAT Score 20 11      mMRC Score   mMRC Score 3 --             UCSD: Self-administered rating of dyspnea associated with activities of daily living (ADLs) 6-point scale (0 = "not at all" to 5 = "maximal or unable to do because of breathlessness")  Scoring Scores range from 0 to 120.  Minimally important difference is 5 units  CAT: CAT can identify the health impairment of COPD patients and is better correlated with disease progression.  CAT has a scoring range of zero to 40. The CAT score is classified into four groups of low (less than 10), medium (10 - 20), high (21-30) and very high (31-40) based on the impact level of disease on health status. A CAT score over 10 suggests significant symptoms.  A worsening CAT score could be explained by an exacerbation, poor medication adherence, poor inhaler technique, or progression of COPD or comorbid conditions.  CAT MCID is 2 points  mMRC: mMRC (Modified Medical Research Council) Dyspnea Scale is used to assess the degree of baseline functional disability in patients of respiratory disease due to dyspnea. No minimal important difference is established. A decrease in score of 1 point or greater is considered a positive change.   Pulmonary Function Assessment:  Pulmonary Function Assessment - 10/09/22 1023       Breath   Shortness of Breath Yes;Limiting activity             Exercise Target Goals: Exercise Program Goal: Individual exercise prescription set using results from initial 6 min walk test and THRR while considering  patient's activity barriers and safety.   Exercise Prescription Goal: Initial exercise prescription builds to 30-45 minutes a day of aerobic activity, 2-3 days per week.  Home exercise guidelines will be given to patient during program as part of exercise  prescription that the participant will acknowledge.  Education: Aerobic Exercise: - Group verbal and visual presentation on the components of exercise prescription. Introduces F.I.T.T principle from ACSM for exercise prescriptions.  Reviews F.I.T.T. principles of aerobic exercise including progression. Written material given at graduation.   Education: Resistance Exercise: - Group verbal and visual presentation on the components of exercise prescription. Introduces F.I.T.T principle from ACSM for exercise prescriptions  Reviews F.I.T.T. principles of resistance exercise including progression. Written material given at graduation.    Education: Exercise & Equipment Safety: - Individual verbal instruction and demonstration of equipment use and safety with use of the equipment. Flowsheet Row Pulmonary Rehab from 12/07/2022 in [facility] Cardiac and Pulmonary Rehab  Date 10/18/22  Educator [facility]  Instruction Review Code 1- Verbalizes Understanding       Education: Exercise Physiology & General Exercise Guidelines: - Group verbal and written instruction with models to review the exercise physiology of the cardiovascular system and associated critical values. Provides general exercise guidelines with specific guidelines to those with heart or lung disease.  Flowsheet Row Pulmonary Rehab from 12/07/2022 in [facility] Cardiac and Pulmonary Rehab  Date 11/09/22  Educator [facility]  Instruction Review Code 1- [employer] Understanding       Education: Flexibility, Balance, Mind/Body Relaxation: - Group verbal and visual presentation with interactive activity on the components of exercise prescription. Introduces F.I.T.T principle from ACSM for exercise prescriptions. Reviews F.I.T.T. principles of flexibility and balance exercise training including progression. Also discusses the mind body connection.  Reviews various relaxation techniques to help reduce and manage stress (i.e. Deep breathing, progressive muscle  relaxation, and visualization). Balance handout provided to take home. Written material given at graduation.   Activity Barriers & Risk Stratification:  Activity Barriers & Cardiac Risk Stratification - 10/18/22 1236       Activity Barriers & Cardiac Risk Stratification   Activity Barriers Right Hip Replacement;Left Knee Replacement;Left Hip Replacement;Right Knee Replacement             6 Minute Walk:  6 Minute Walk     Row Name 10/18/22 1233 01/30/23 0940       6 Minute Walk   Phase Initial Discharge    Distance 825 feet 1260 feet    Distance % Change -- 52.7 %    Distance Feet Change -- 435 ft    Walk Time 6 minutes 6 minutes    # of Rest Breaks 0 0    MPH 1.56 2.39    METS 1.07 1.85    RPE 13 11    Perceived Dyspnea  3 1    VO2 Peak 3.76 6.47    Symptoms Yes (comment) No    Comments SOB --    Resting HR 99 bpm 77 bpm    Resting BP 110/60 116/62    Resting Oxygen Saturation  95 % 93 %    Exercise Oxygen Saturation  during 6 min walk 93 % 94 %    Max Ex. HR 107 bpm 103 bpm    Max Ex. BP 128/70 126/64    2 Minute Post BP 118/76 124/68      Interval HR   1 Minute HR 81 92    2 Minute HR 70 94    3 Minute HR 84 95    4 Minute HR 82 99    5 Minute HR 77 100    6 Minute HR 107 103    2 Minute Post HR 102 96    Interval Heart Rate? Yes Yes      Interval Oxygen   Interval Oxygen? Yes Yes    Baseline Oxygen Saturation % 95 % 93 %    1 Minute Oxygen Saturation % 93 % 96 %    1 Minute Liters of Oxygen 0 L 0 L  RA    2 Minute Oxygen Saturation % 94 % 94 %    2 Minute Liters of Oxygen 0 L 0 L    3 Minute Oxygen Saturation % 93 % 95 %    3 Minute Liters of Oxygen 0 L 0 L    4 Minute Oxygen Saturation % 93 % 94 %    4 Minute Liters of Oxygen 0 L 0 L    5 Minute Oxygen Saturation % 94 % 95 %    5 Minute Liters of Oxygen 0 L 0 L    6 Minute Oxygen Saturation % 94 % 95 %    6 Minute Liters of Oxygen 0 L 0 L    2 Minute Post Oxygen Saturation % 94 % 98 %    2  Minute Post Liters of Oxygen 0 L 0 L            Oxygen Initial Assessment:  Oxygen Initial Assessment - 10/18/22 1252       Home Oxygen   Home Oxygen Device None    Sleep Oxygen Prescription CPAP    Liters per minute 0    Home Exercise Oxygen Prescription None    Home Resting Oxygen Prescription None    Compliance with Home Oxygen Use Yes  Initial 6 min Walk   Oxygen Used None      Program Oxygen Prescription   Program Oxygen Prescription None      Intervention   Short Term Goals To learn and exhibit compliance with exercise, home and travel O2 prescription;To learn and understand importance of monitoring SPO2 with pulse oximeter and demonstrate accurate use of the pulse oximeter.;To learn and understand importance of maintaining oxygen saturations>88%;To learn and demonstrate proper pursed lip breathing techniques or other breathing techniques. ;To learn and demonstrate proper use of respiratory medications    Long  Term Goals Exhibits compliance with exercise, home  and travel O2 prescription;Verbalizes importance of monitoring SPO2 with pulse oximeter and return demonstration;Maintenance of O2 saturations>88%;Exhibits proper breathing techniques, such as pursed lip breathing or other method taught during program session;Compliance with respiratory medication;Demonstrates proper use of MDI's             Oxygen Re-Evaluation:  Oxygen Re-Evaluation     Row Name 11/09/22 0928 11/30/22 0952 01/18/23 1009 02/20/23 0951       Program Oxygen Prescription   Program Oxygen Prescription None None None None      Home Oxygen   Home Oxygen Device None None None None    Sleep Oxygen Prescription CPAP CPAP CPAP CPAP    Liters per minute 0 0 0 0    Home Exercise Oxygen Prescription None None None None    Home Resting Oxygen Prescription None None None None    Compliance with Home Oxygen Use Yes Yes Yes Yes      Goals/Expected Outcomes   Short Term Goals To learn and  demonstrate proper pursed lip breathing techniques or other breathing techniques.  To learn and demonstrate proper pursed lip breathing techniques or other breathing techniques.  To learn and demonstrate proper pursed lip breathing techniques or other breathing techniques.  To learn and demonstrate proper pursed lip breathing techniques or other breathing techniques.     Long  Term Goals Exhibits proper breathing techniques, such as pursed lip breathing or other method taught during program session Exhibits proper breathing techniques, such as pursed lip breathing or other method taught during program session Exhibits proper breathing techniques, such as pursed lip breathing or other method taught during program session Exhibits proper breathing techniques, such as pursed lip breathing or other method taught during program session    Comments Informed patient how to perform the Pursed Lipped breathing technique. Told patient to Inhale through the nose and out the mouth with pursed lips to keep their airways open, help oxygenate them better, practice when at rest or doing strenuous activity. Patient Verbalizes understanding of technique and will work on and be reiterated during [facility]. Reviewed PLB techique with pt. He states that he continues to practice PLB at rest and during exercise. He reports that his pulmonologist also encouraged him to contnue to eat correctly and exercise to improve SOB. Orvilla Fus states that he continues to practice PLB at rest and during exercise. He reports tthat his breathing is easier with exercise and ADL since he started the program. Orvilla Fus states that he continues to practice PLB at rest and during exercise. He reports that his breathing is easier with exercise and ADL since starting rehab. We encouraged him to continue to practice PLB after graduating from the program.    Goals/Expected Outcomes Short: use PLB with exertion. Long: use PLB on exertion proficiently and independently.  Short: Continue to use PLB with exertion. Long: Become proficient and independent with  PLB. Short: Continue to use PLB with exertion. Long: Become proficient and independent with PLB. Short: Continue to use PLB with exertion. Long: Become proficient and independent with PLB.             Oxygen Discharge (Final Oxygen Re-Evaluation):  Oxygen Re-Evaluation - 02/20/23 0951       Program Oxygen Prescription   Program Oxygen Prescription None      Home Oxygen   Home Oxygen Device None    Sleep Oxygen Prescription CPAP    Liters per minute 0    Home Exercise Oxygen Prescription None    Home Resting Oxygen Prescription None    Compliance with Home Oxygen Use Yes      Goals/Expected Outcomes   Short Term Goals To learn and demonstrate proper pursed lip breathing techniques or other breathing techniques.     Long  Term Goals Exhibits proper breathing techniques, such as pursed lip breathing or other method taught during program session    Comments Orvilla Fus states that he continues to practice PLB at rest and during exercise. He reports that his breathing is easier with exercise and ADL since starting rehab. We encouraged him to continue to practice PLB after graduating from the program.    Goals/Expected Outcomes Short: Continue to use PLB with exertion. Long: Become proficient and independent with PLB.             Initial Exercise Prescription:  Initial Exercise Prescription - 10/18/22 1200       Date of Initial Exercise RX and Referring Provider   Date 10/18/22    Referring Provider Dr. Birdena Jubilee      Oxygen   Maintain Oxygen Saturation 88% or higher      NuStep   Level 1   T6   SPM 80    Minutes 15    METs 1.07      REL-XR   Level 1    Speed 50    Minutes 15    METs 1.07      Track   Laps 10    Minutes 15    METs 1.54      Prescription Details   Frequency (times per week) 2    Duration Progress to 30 minutes of continuous aerobic without  signs/symptoms of physical distress      Intensity   THRR 40-80% of Max Heartrate 116-134    Ratings of Perceived Exertion 11-13    Perceived Dyspnea 0-4      Progression   Progression Continue to progress workloads to maintain intensity without signs/symptoms of physical distress.      Resistance Training   Training Prescription Yes    Weight 3    Reps 10-15             Perform Capillary Blood Glucose checks as needed.  Exercise Prescription Changes:   Exercise Prescription Changes     Row Name 10/18/22 1200 10/19/22 1000 10/26/22 1600 11/09/22 1600 11/22/22 1100     Response to Exercise   Blood Pressure (Admit) 110/60 110/60 114/64 120/68 124/74   Blood Pressure (Exercise) 128/70 128/70 128/70 136/68 150/70   Blood Pressure (Exit) 118/76 118/76 130/78 108/58 122/62   Heart Rate (Admit) 99 bpm 99 bpm 98 bpm 87 bpm 86 bpm   Heart Rate (Exercise) 107 bpm 107 bpm 98 bpm 108 bpm 109 bpm   Heart Rate (Exit) 92 bpm 92 bpm 99 bpm 94 bpm 97 bpm   Oxygen Saturation (Admit) 95 %  95 % 93 % 94 % 93 %   Oxygen Saturation (Exercise) 93 % 93 % 89 % 91 % 91 %   Oxygen Saturation (Exit) 94 % 94 % 93 % 94 % 93 %   Rating of Perceived Exertion (Exercise) 13 13 15 15 15    Perceived Dyspnea (Exercise) 3 3 2 2 1    Symptoms SOB SOB -- SOB SOB   Comments 6 MWT results 6 MWT results -- -- --   Duration -- -- Continue with 30 min of aerobic exercise without signs/symptoms of physical distress. Continue with 30 min of aerobic exercise without signs/symptoms of physical distress. Continue with 30 min of aerobic exercise without signs/symptoms of physical distress.   Intensity -- -- THRR unchanged THRR unchanged THRR unchanged     Progression   Progression -- -- Continue to progress workloads to maintain intensity without signs/symptoms of physical distress. Continue to progress workloads to maintain intensity without signs/symptoms of physical distress. Continue to progress workloads to maintain  intensity without signs/symptoms of physical distress.   Average METs -- -- -- 2.21 2.49     Resistance Training   Training Prescription -- Yes Yes Yes Yes   Weight -- 3 3 3  lb 3 lb   Reps -- 10-15 10-15 10-15 10-15     Interval Training   Interval Training -- -- -- No No     NuStep   Level -- 1  T6 1  T6 4  T6 nustep 4  T6 nustep   SPM -- 80 80 -- --   Minutes -- 15 15 15 15    METs -- 1.07 2.4 2.6 2.9     REL-XR   Level -- 1 1 2 4    Speed -- 50 50 -- --   Minutes -- 15 15 15 15    METs -- 1.07 1.07 3.4 2.2     Track   Laps -- 10 -- 15  Hallway 24  hallway   Minutes -- 15 -- 15 15   METs -- 1.54 -- 1.65 2.03     Oxygen   Maintain Oxygen Saturation -- -- -- 88% or higher 88% or higher    Row Name 12/07/22 1500 12/19/22 1500 12/26/22 1000 01/04/23 1400 01/18/23 0700     Response to Exercise   Blood Pressure (Admit) 120/72 128/70 -- 134/70 142/72   Blood Pressure (Exercise) 130/70 -- -- -- --   Blood Pressure (Exit) 138/80 126/72 -- 118/70 120/70   Heart Rate (Admit) 80 bpm 77 bpm -- 82 bpm 80 bpm   Heart Rate (Exercise) 113 bpm 106 bpm -- 106 bpm 111 bpm   Heart Rate (Exit) 91 bpm 90 bpm -- 89 bpm 95 bpm   Oxygen Saturation (Admit) 91 % 93 % -- 90 % 93 %   Oxygen Saturation (Exercise) 91 % 90 % -- 91 % 91 %   Oxygen Saturation (Exit) 93 % 96 % -- 92 % 93 %   Rating of Perceived Exertion (Exercise) 13 14 -- 13 12   Perceived Dyspnea (Exercise) 1 2 -- 1 1   Symptoms none none -- none none   Duration Continue with 30 min of aerobic exercise without signs/symptoms of physical distress. Continue with 30 min of aerobic exercise without signs/symptoms of physical distress. -- Continue with 30 min of aerobic exercise without signs/symptoms of physical distress. Continue with 30 min of aerobic exercise without signs/symptoms of physical distress.   Intensity THRR unchanged THRR unchanged -- THRR unchanged  THRR unchanged     Progression   Progression Continue to progress  workloads to maintain intensity without signs/symptoms of physical distress. Continue to progress workloads to maintain intensity without signs/symptoms of physical distress. -- Continue to progress workloads to maintain intensity without signs/symptoms of physical distress. Continue to progress workloads to maintain intensity without signs/symptoms of physical distress.   Average METs 2.9 2.94 -- 3.38 2.93     Resistance Training   Training Prescription Yes Yes -- Yes Yes   Weight 3 lb 3 lb -- 3 lb 3 lb   Reps 10-15 10-15 -- 10-15 10-15     Interval Training   Interval Training No No -- No No     NuStep   Level 4  T6 5  T6 nustep -- 5  T6 nustep 5  T6   Minutes 15 15 -- 15 15   METs 3.1 3.1 -- 3.2 3     REL-XR   Level 4 5 -- 5 5   Minutes 15 15 -- 15 15   METs 4.2 4.4 -- 4.4 4.2     Biostep-RELP   Level 3 -- -- -- --   Minutes 15 -- -- -- --   METs 3 -- -- -- --     Track   Laps 30 30 -- 30  Hallway 30   Minutes 15 15 -- 15 15   METs 2.63 2.63 -- 2.63 2.31     Home Exercise Plan   Plans to continue exercise at -- -- Home (comment)  states that he will continue his use of hand weights as well as walking around with his wife while they run errands or go to doctors appointments. Home (comment)  states that he will continue his use of hand weights as well as walking around with his wife while they run errands or go to doctors appointments. Home (comment)  states that he will continue his use of hand weights as well as walking around with his wife while they run errands or go to doctors appointments.   Frequency -- -- Add 2 additional days to program exercise sessions. Add 2 additional days to program exercise sessions. Add 2 additional days to program exercise sessions.   Initial Home Exercises Provided -- -- 12/26/22 12/26/22 12/26/22     Oxygen   Maintain Oxygen Saturation 88% or higher 88% or higher -- 88% or higher 88% or higher    Row Name 02/02/23 0800 02/15/23 1100            Response to Exercise   Blood Pressure (Admit) 126/76 108/64      Blood Pressure (Exit) 114/62 114/66      Heart Rate (Admit) 72 bpm 86 bpm      Heart Rate (Exercise) 111 bpm 111 bpm      Heart Rate (Exit) 90 bpm 105 bpm      Oxygen Saturation (Admit) 95 % 94 %      Oxygen Saturation (Exercise) 91 % 96 %      Oxygen Saturation (Exit) 97 % 93 %      Rating of Perceived Exertion (Exercise) 12 12      Perceived Dyspnea (Exercise) 1 1      Symptoms none none      Duration Continue with 30 min of aerobic exercise without signs/symptoms of physical distress. Continue with 30 min of aerobic exercise without signs/symptoms of physical distress.      Intensity THRR unchanged THRR unchanged  Progression   Progression Continue to progress workloads to maintain intensity without signs/symptoms of physical distress. Continue to progress workloads to maintain intensity without signs/symptoms of physical distress.      Average METs 3.33 3.22        Resistance Training   Training Prescription Yes Yes      Weight 5 lb 5 lb      Reps 10-15 10-15        Interval Training   Interval Training No No        NuStep   Level 6  T6 nustep 7  T6 nustep      Minutes 15 15      METs 3.5 3.4        REL-XR   Level 6 6      Minutes 15 15      METs 4.9 4        Track   Laps 30  Hallway 37  hallway      Minutes 15 15      METs 2.31 2.62        Home Exercise Plan   Plans to continue exercise at Home (comment)  states that he will continue his use of hand weights as well as walking around with his wife while they run errands or go to doctors appointments. Home (comment)  states that he will continue his use of hand weights as well as walking around with his wife while they run errands or go to doctors appointments.      Frequency Add 2 additional days to program exercise sessions. Add 2 additional days to program exercise sessions.      Initial Home Exercises Provided 12/26/22 12/26/22         Oxygen   Maintain Oxygen Saturation 88% or higher 88% or higher               Exercise Comments:   Exercise Comments     Row Name 10/19/22 1007           Exercise Comments First full day of exercise!  Patient was oriented to gym and equipment including functions, settings, policies, and procedures.  Patient's individual exercise prescription and treatment plan were reviewed.  All starting workloads were established based on the results of the 6 minute walk test done at initial orientation visit.  The plan for exercise progression was also introduced and progression will be customized based on patient's performance and goals.                Exercise Goals and Review:   Exercise Goals     Row Name 10/18/22 1247             Exercise Goals   Increase Physical Activity Yes       Intervention Provide advice, education, support and counseling about physical activity/exercise needs.;Develop an individualized exercise prescription for aerobic and resistive training based on initial evaluation findings, risk stratification, comorbidities and participant's personal goals.       Expected Outcomes Short Term: Attend rehab on a regular basis to increase amount of physical activity.;Long Term: Add in home exercise to make exercise part of routine and to increase amount of physical activity.;Long Term: Exercising regularly at least 3-5 days a week.       Increase Strength and Stamina Yes       Intervention Provide advice, education, support and counseling about physical activity/exercise needs.;Develop an individualized exercise prescription for aerobic and resistive training based on initial  evaluation findings, risk stratification, comorbidities and participant's personal goals.       Expected Outcomes Short Term: Increase workloads from initial exercise prescription for resistance, speed, and METs.;Short Term: Perform resistance training exercises routinely during rehab and add in  resistance training at home;Long Term: Improve cardiorespiratory fitness, muscular endurance and strength as measured by increased METs and functional capacity ( )       Able to understand and use rate of perceived exertion (RPE) scale Yes       Intervention Provide education and explanation on how to use RPE scale       Expected Outcomes Short Term: Able to use RPE daily in rehab to express subjective intensity level;Long Term:  Able to use RPE to guide intensity level when exercising independently       Able to understand and use Dyspnea scale Yes       Intervention Provide education and explanation on how to use Dyspnea scale       Expected Outcomes Short Term: Able to use Dyspnea scale daily in rehab to express subjective sense of shortness of breath during exertion;Long Term: Able to use Dyspnea scale to guide intensity level when exercising independently       Knowledge and understanding of Target Heart Rate Range (THRR) Yes       Intervention Provide education and explanation of THRR including how the numbers were predicted and where they are located for reference       Expected Outcomes Short Term: Able to state/look up THRR;Long Term: Able to use THRR to govern intensity when exercising independently;Short Term: Able to use daily as guideline for intensity in rehab       Able to check pulse independently Yes       Intervention Provide education and demonstration on how to check pulse in carotid and radial arteries.;Review the importance of being able to check your own pulse for safety during independent exercise       Expected Outcomes Short Term: Able to explain why pulse checking is important during independent exercise;Long Term: Able to check pulse independently and accurately       Understanding of Exercise Prescription Yes       Intervention Provide education, explanation, and written materials on patient's individual exercise prescription       Expected Outcomes Short Term: Able to  explain program exercise prescription;Long Term: Able to explain home exercise prescription to exercise independently                Exercise Goals Re-Evaluation :  Exercise Goals Re-Evaluation     Row Name 10/19/22 1007 10/26/22 1700 11/09/22 1603 11/22/22 1107 12/07/22 1512     Exercise Goal Re-Evaluation   Exercise Goals Review Able to understand and use rate of perceived exertion (RPE) scale;Able to understand and use Dyspnea scale;Knowledge and understanding of Target Heart Rate Range (THRR);Understanding of Exercise Prescription Increase Physical Activity;Understanding of Exercise Prescription;Increase Strength and Stamina Increase Physical Activity;Increase Strength and Stamina;Understanding of Exercise Prescription Increase Physical Activity;Increase Strength and Stamina;Understanding of Exercise Prescription Increase Physical Activity;Increase Strength and Stamina;Understanding of Exercise Prescription   Comments Reviewed RPE and dyspnea scale, THR and program prescription with pt today.  Pt voiced understanding and was given a copy of goals to take home. Orvilla Fus is off to a good start in the program. He has completed one exercise session on this review. On his first day, he was able to follow his exercise prescription with the T6, and only was able to  do 5 minutes on the XR. We will continue to monitor his progress in the program. Orvilla Fus is doing well in rehab. He has increased his laps walking to 15 laps in the hallway. He also improved to level 4 on the T6 nustep and level 2 on the XR. He continues to do well with 3 lb handweights for resistance training as well. We will continue to monitor his progress in the program. Orvilla Fus continues to do well in rehab. He recently increased his number of laps walked in the hallways to 24 laps! He also increased to level 4 on the XR and stayed consistent at level 4 on the T6 nustep. We will continue to monitor his progress in the program. Orvilla Fus continues to  make improvements in rehab. He has been able to increase his number of laps in the hallway to 30!! He was also able to maintain level 4 on the T6 nustep. We will continue to monitor his progress in the program.   Expected Outcomes Short: Use RPE daily to regulate intensity. Long: Follow program prescription in THR. Short: Continue to follow current exercise prescription. Long: Continue exercise to improve strength and stamina. Short: Continue to push for more laps on the track. Long: Continue exercise to improve strength and stamina. Short: Continue to progressively increase workloads. Long: Continue exercise to improve strength and stamina. Short: Continue to progressively increase workloads. Long: Continue exercise to improve strength and stamina.    Row Name 12/19/22 1524 12/26/22 1009 01/04/23 1436 01/18/23 0730 01/18/23 1006     Exercise Goal Re-Evaluation   Exercise Goals Review Increase Physical Activity;Increase Strength and Stamina;Understanding of Exercise Prescription Increase Physical Activity;Able to understand and use Dyspnea scale;Understanding of Exercise Prescription;Knowledge and understanding of Target Heart Rate Range (THRR);Increase Strength and Stamina;Able to understand and use rate of perceived exertion (RPE) scale;Able to check pulse independently Increase Physical Activity;Increase Strength and Stamina;Understanding of Exercise Prescription Increase Physical Activity;Increase Strength and Stamina;Understanding of Exercise Prescription Increase Physical Activity   Comments Orvilla Fus continues to do well in rehab. He recently improved to level 5 on the T6 nustep and XR. He also continues to walk 30 laps on the track and use 3 lb hand weights for resistance training. We will continue to monitor his progress in the program. Reviewed home exercise with pt today from 9:50 to 10:02.  Pt plans to continue strength training with his 5 lb hand weights, as well as walking with his wife inside the  mall or while running errands for exercise.  Reviewed THR, pulse, RPE, sign and symptoms, pulse oximetery and when to call 911 or MD.  Also discussed weather considerations and indoor options.  Pt voiced understanding. Orvilla Fus is doing well in the program. He continues to do well on both the XR and T6 nustep at level 5. He also continues to walk the hallway and has consistently reached 30 laps. He is doing well with 3 lb hand weights for resistance training as well. We will continue to monitor his progress in the program. Orvilla Fus continues to do well in the program. He continues to maintain level 5 on both the XR and T6 nustep, and 30 hallway laps in 15 minutes. We will continue to  encourage and monitor his progression in the program. Orvilla Fus is using his resistive weights at home at least 1-2 times a week.  He will walk some in his house, not as consistent with htis activity.   Expected Outcomes Short: Increase to 4 lb hand weights  for resistance training. Long: Continue exercise to improve strength and stamina. Short: Implement and designate time at home for exercise. Long: Continue exercise to improve strength and stamina. Short: Continue to push for more laps during walking. Long: Continue exercise to improve strength and stamina. Short: Increase to level 6 on XR and T6 nustep. Long: Continue exercise to improve strength and stamina. STG more walking at home and continue with the hand weights for Home RX.  Continue with exercise progression in program  LTG maintain exercise progression after discahrge    Row Name 02/02/23 0856 02/15/23 1104 02/20/23 0939         Exercise Goal Re-Evaluation   Exercise Goals Review Increase Physical Activity;Increase Strength and Stamina;Understanding of Exercise Prescription Increase Physical Activity;Increase Strength and Stamina;Understanding of Exercise Prescription Increase Physical Activity;Increase Strength and Stamina;Understanding of Exercise Prescription     Comments  Orvilla Fus is doing well and is cloe to graduating from the program. He recently completed his post and improved by 52.7%! He also improved to level 6 on the T6 nustep and XR. We will continue to monitor his progress untl he graduates from the program. Orvilla Fus continues to do well in the program. He recently increased his walking distance to 37 laps in the hallway. He also improved to level 7 on the T6 nustep. We will continue to monitor his progress until he graduates from the program. Orvilla Fus states that he believes that rehab has helped him to improve his stamina and breathing. He also plans to get back to playing golf. 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 He also has flat ground at home to walk on for exercise.     Expected Outcomes Short: Graduate. Long: Continue to exercise independently. Short: Graduate. Long: Continue to exercise independently. Short: Graduate. Long: Continue to exercise independently.              Discharge Exercise Prescription (Final Exercise Prescription Changes):  Exercise Prescription Changes - 02/15/23 1100       Response to Exercise   Blood Pressure (Admit) 108/64    Blood Pressure (Exit) 114/66    Heart Rate (Admit) 86 bpm    Heart Rate (Exercise) 111 bpm    Heart Rate (Exit) 105 bpm    Oxygen Saturation (Admit) 94 %    Oxygen Saturation (Exercise) 96 %    Oxygen Saturation (Exit) 93 %    Rating of Perceived Exertion (Exercise) 12    Perceived Dyspnea (Exercise) 1    Symptoms none    Duration Continue with 30 min of aerobic exercise without signs/symptoms of physical distress.    Intensity THRR unchanged      Progression   Progression Continue to progress workloads to maintain intensity without signs/symptoms of physical distress.    Average METs 3.22      Resistance Training   Training Prescription Yes    Weight 5 lb    Reps 10-15      Interval Training   Interval Training No       NuStep   Level 7   T6 nustep   Minutes 15    METs 3.4      REL-XR   Level 6    Minutes 15    METs 4      Track   Laps 37   hallway   Minutes 15    METs 2.62      Home Exercise  Plan   Plans to continue exercise at Home (comment)   states that he will continue his use of hand weights as well as walking around with his wife while they run errands or go to doctors appointments.   Frequency Add 2 additional days to program exercise sessions.    Initial Home Exercises Provided 12/26/22      Oxygen   Maintain Oxygen Saturation 88% or higher             Nutrition:  Target Goals: Understanding of nutrition guidelines, daily intake of sodium 1500mg , cholesterol 200mg , calories 30% from fat and 7% or less from saturated fats, daily to have 5 or more servings of fruits and vegetables.  Education: All About Nutrition: -Group instruction provided by verbal, written material, interactive activities, discussions, models, and posters to present general guidelines for heart healthy nutrition including fat, fiber, MyPlate, the role of sodium in heart healthy nutrition, utilization of the nutrition label, and utilization of this knowledge for meal planning. Follow up email sent as well. Written material given at graduation. Flowsheet Row Pulmonary Rehab from 12/07/2022 in [facility] Cardiac and Pulmonary Rehab  Date 12/07/22  Educator JG  Instruction Review Code 1- Verbalizes Understanding       Biometrics:  Pre Biometrics - 10/18/22 1248       Pre Biometrics   Height 5' 11.5" (1.816 m)    Weight 280 lb 6.4 oz (127.2 kg)    Waist Circumference 51 inches    Hip Circumference 53.5 inches    Waist to Hip Ratio 0.95 %    BMI (Calculated) 38.57    Single Leg Stand 1.6 seconds             Post Biometrics - 01/30/23 0942        Post  Biometrics   Height 5' 11.5" (1.816 m)    Weight 272 lb 9.6 oz (123.7 kg)    Waist Circumference 48.5 inches    Hip Circumference 51.5 inches     Waist to Hip Ratio 0.94 %    BMI (Calculated) 37.49    Single Leg Stand 2.96 seconds             Nutrition Therapy Plan and Nutrition Goals:  Nutrition Therapy & Goals - 10/18/22 1319       Nutrition Therapy   Diet Cardiac, Low Na    Protein (specify units) 75    Fiber 30 grams    Whole Grain Foods 3 servings    Saturated Fats 15 max. grams    Fruits and Vegetables 5 servings/day    Sodium 2 grams      Personal Nutrition Goals   Nutrition Goal Drink 2 bottles of water daily    Personal Goal #2 Eat something nutrient/calorie dense at every meal hour    Personal Goal #3 Pair a complex carb with a protein or healthy fat    Comments Patient drinking ~20oz of water daily. Spoke about the importance of hydration and set short term goal of drinking 2 bottles per day (~32oz), with a long term goal of ~64oz daily. He reports that he likes to snack on sweets, but his appetite has been less than it used to be, often not missing a meal or getting full quickly. Reviewed mediterranean diet handout, educated on types of fats, sources, how to read food label. Educated on how healthy fats can be heart healthy and high in calories. Explain how choosing calorie dense foods such as peanut butter, olives,  olives or avocado oils. Reviewed his 24hr food recall, explained how he should look to pair complex carbs with a protein or healthy fats when making meals or snacks. Brainstormed and built out a few example meals and snacks focusing on getting enough nutrition with his poor appetite.      Intervention Plan   Intervention Prescribe, educate and counsel regarding individualized specific dietary modifications aiming towards targeted core components such as weight, hypertension, lipid management, diabetes, heart failure and other comorbidities.;Nutrition handout(s) given to patient.    Expected Outcomes Short Term Goal: Understand basic principles of dietary content, such as calories, fat, sodium, cholesterol  and nutrients.;Short Term Goal: A plan has been developed with personal nutrition goals set during dietitian appointment.;Long Term Goal: Adherence to prescribed nutrition plan.             Nutrition Assessments:  MEDIFICTS Score Key: >=70 Need to make dietary changes  40-70 Heart Healthy Diet <= 40 Therapeutic Level Cholesterol Diet  Flowsheet Row Pulmonary Rehab from 02/01/2023 in [facility] Cardiac and Pulmonary Rehab  Picture Your Plate Total Score on Discharge 53      Picture Your Plate Scores: <45 Unhealthy dietary pattern with much room for improvement. 41-50 Dietary pattern unlikely to meet recommendations for good health and room for improvement. 51-60 More healthful dietary pattern, with some room for improvement.  >60 Healthy dietary pattern, although there may be some specific behaviors that could be improved.   Nutrition Goals Re-Evaluation:  Nutrition Goals Re-Evaluation     Row Name 11/09/22 [id number] 11/30/22 0943 01/02/23 0937 01/18/23 1015 02/20/23 0945     Goals   Current Weight 276 lb (125.2 kg) -- -- -- --   Nutrition Goal -- -- -- Drink 2 bottles of water daily --   Comment Patient was informed on why it is important to maintain a balanced diet when dealing with Respiratory issues. Explained that it takes a lot of energy to breath and when they are short of breath often they will need to have a good diet to help keep up with the calories they are expending for breathing. Orvilla Fus states that he still is working on drinking plenty of water. He has been drinking a flavored water called Fruit2O. He also has been working on getting more protein as discussed with the RD. He has been eating more peanut butter with fruit as a snack to help with this. He also has not been adding salt at the table to help limit sodium intake. He is doing well with the water goal of 2 bottles per day,  set goat to aim for 2-2.5bottles per day. He is eating apples and peanut butter. He is doing better  with protien, discussed protein shakes. his portions are smaller. Watching sodium intake when reading labels Goal #1 and #3.    Orvilla Fus continues to drink 2 or more bottles of water daily, he is eating apples with peanut butter.  His wife and he are watching the sodium intake as well as eating healthy fats, no fried foods. Orvilla Fus states that his eating habits have really changed. He is doing better with portion control and not overeating. He also reports drinking more water, and averaging a little over 2 bottles a day. He is limiting his fried foods and sodium also.   Expected Outcome Short: Choose and plan snacks accordingly to patients caloric intake to improve breathing. Long: Maintain a diet independently that meets their caloric intake to aid in daily shortness of breath. Short:  Continue to get more protein and water. Long: Continue to practice healthy eating patterns discussed with RD. Short: drink 2-2.5bottles of water daily, try protein shakes. Long: COntinue to work on healthy eating patterns while meeting nutrition goals STG continue to work on the nutrition goals.  LTG maintaining healthy food choices after discharge Short: Continue to watch portion control. Long: Continue to practice healthy eating patterns as discussed with RD.     Personal Goal #3 Re-Evaluation   Personal Goal #3 -- -- -- Pair a complex carb with a protein or healthy fat --            Nutrition Goals Discharge (Final Nutrition Goals Re-Evaluation):  Nutrition Goals Re-Evaluation - 02/20/23 0945       Goals   Comment Tommy states that his eating habits have really changed. He is doing better with portion control and not overeating. He also reports drinking more water, and averaging a little over 2 bottles a day. He is limiting his fried foods and sodium also.    Expected Outcome Short: Continue to watch portion control. Long: Continue to practice healthy eating patterns as discussed with RD.              Psychosocial: Target Goals: Acknowledge presence or absence of significant depression and/or stress, maximize coping skills, provide positive support system. Participant is able to verbalize types and ability to use techniques and skills needed for reducing stress and depression.   Education: Stress, Anxiety, and Depression - Group verbal and visual presentation to define topics covered.  Reviews how body is impacted by stress, anxiety, and depression.  Also discusses healthy ways to reduce stress and to treat/manage anxiety and depression.  Written material given at graduation. Flowsheet Row Pulmonary Rehab from 12/07/2022 in [facility] Cardiac and Pulmonary Rehab  Date 11/02/22  Educator SB  Instruction Review Code 1- [employer] Understanding       Education: Sleep Hygiene -Provides group verbal and written instruction about how sleep can affect your health.  Define sleep hygiene, discuss sleep cycles and impact of sleep habits. Review good sleep hygiene tips.    Initial Review & Psychosocial Screening:  Initial Psych Review & Screening - 10/09/22 1025       Initial Review   Current issues with Current Psychotropic Meds;Current Sleep Concerns      Family Dynamics   Good Support System? Yes    Comments Orvilla Fus has a problem with his son and is not on speaking. He can look to his wife as a great support system. His sleep has been doing well and takes medication.      Barriers   Psychosocial barriers to participate in program The patient should benefit from training in stress management and relaxation.      Screening Interventions   Interventions Encouraged to exercise;To provide support and resources with identified psychosocial needs;Provide feedback about the scores to participant    Expected Outcomes Short Term goal: Utilizing psychosocial counselor, staff and physician to assist with identification of specific Stressors or current issues interfering with healing process. Setting  desired goal for each stressor or current issue identified.;Long Term Goal: Stressors or current issues are controlled or eliminated.;Short Term goal: Identification and review with participant of any Quality of Life or Depression concerns found by scoring the questionnaire.;Long Term goal: The participant improves quality of Life and PHQ9 Scores as seen by post scores and/or verbalization of changes             Quality of  Life Scores:  Scores of 19 and below usually indicate a poorer quality of life in these areas.  A difference of  2-3 points is a clinically meaningful difference.  A difference of 2-3 points in the total score of the Quality of Life Index has been associated with significant improvement in overall quality of life, self-image, physical symptoms, and general health in studies assessing change in quality of life.  PHQ-9: Review Flowsheet  More data exists      02/01/2023 12/12/2022 11/30/2022 11/09/2022 10/23/2022  Depression screen PHQ 2/9  Decreased Interest 0 0 0 0 1  Down, Depressed, Hopeless 0 0 0 1 1  PHQ - 2 Score 0 0 0 1 2  Altered sleeping 1 - 0 0 1  Tired, decreased energy 1 - 1 2 3   Change in appetite 0 - 0 1 1  Feeling bad or failure about yourself  0 - 0 1 1  Trouble concentrating 0 - 0 0 0  Moving slowly or fidgety/restless 0 - 0 0 1  Suicidal thoughts 0 - 0 0 0  PHQ-9 Score 2 - 1 5 9   Difficult doing work/chores Somewhat difficult - Somewhat difficult Not difficult at all Very difficult   Interpretation of Total Score  Total Score Depression Severity:  1-4 = Minimal depression, 5-9 = Mild depression, 10-14 = Moderate depression, 15-19 = Moderately severe depression, 20-27 = Severe depression   Psychosocial Evaluation and Intervention:  Psychosocial Evaluation - 10/09/22 1027       Psychosocial Evaluation & Interventions   Interventions Relaxation education;Encouraged to exercise with the program and follow exercise prescription;Stress management  education    Comments Orvilla Fus has a problem with his son and is not on speaking. He can look to his wife as a great support system. His sleep has been doing well and takes medication.    Expected Outcomes Short: Start [facility] to help with mood. Long: Maintain a healthy mental state.    Continue Psychosocial Services  Follow up required by staff             Psychosocial Re-Evaluation:  Psychosocial Re-Evaluation     Row Name 11/09/22 0935 11/30/22 0940 01/02/23 0934 01/18/23 1011 02/20/23 0942     Psychosocial Re-Evaluation   Current issues with Current Stress Concerns Current Stress Concerns None Identified None Identified Current Stress Concerns   Comments Reviewed patient health questionnaire (PHQ-9) with patient for follow up. Previously, patients score indicated signs/symptoms of depression.  Reviewed to see if patient is improving symptom wise while in program.  Score improved and patient states that it is because he has been able to workout and have a little more energy. Reviewed patient health questionnaire (PHQ-9) with patient for follow up. Previously, patients score indicated signs/symptoms of depression. Reviewed to see if patient is improving symptom wise while in program. Score improved as patient states that reading and exercise have been good stress relievers for him. He has some stress related to family issues with his son, but it is overall doing well. Patient reports no anxiety depression or stress. He reports has good support system. has his wife to help him with dealing with stress and stay positive mindset. Reports no sleep issues at this time. Knows the holiday season will be busy but generally enjoys the time of the year. Orvilla Fus is doing well, sleeping well and manageing his stress with exercise and reading.  "Reading takes me into another place." He continues to have long term family stress with  a son that will not communicate with him and his wife. Tiommy is managing this  with living his life and taking measures to reduce any stress related symptoms. STG Orvilla Fus continues to manage any stress with his reading, exericse.  Will call MD for assistance if symptoms become enough to disturb his daily life. Tommy reports that his son has not spoken with him in a over a year which he states hurts quite a bit. Other than this issue he has no other major stress concerns. Orvilla Fus states that he sleeps well overall, with the occasional issue with his CPAP machine. He enjoys reading for stress relief and is looking forward to getting back to playing golf.   Expected Outcomes Short: Continue to attend [facility] regularly for regular exercise and social engagement. Long: Continue to improve symptoms and manage a positive mental state. Short: Continue to attend [facility] regularly for regular exercise and social engagement. Long: Continue to maintain positive outlook. Short: continue to attend pulm rehab for exercise and social engagement. Long: Continue to use support system to maintain postive outlook STG continue to manage any stress with his reading and walking, attend all scheduled sessions. LTG Continue with stress managment activities and talk with MD if techniques do not work. Short: Continue to manage stress relief through healthy avenues. Long: Continue to maintain positive outlook.   Interventions Encouraged to attend [facility] for the exercise Encouraged to attend Pulmonary Rehabilitation for the exercise Encouraged to attend Pulmonary Rehabilitation for the exercise Encouraged to attend [facility] Rehabilitation for the exercise Encouraged to attend Pulmonary Rehabilitation for the exercise   Continue Psychosocial Services  Follow up required by staff Follow up required by staff Follow up required by staff Follow up required by staff Follow up required by staff     Initial Review   Source of Stress Concerns -- Family -- -- Family            Psychosocial Discharge  (Final Psychosocial Re-Evaluation):  Psychosocial Re-Evaluation - 02/20/23 0942       Psychosocial Re-Evaluation   Current issues with Current Stress Concerns    Comments Orvilla Fus reports that his son has not spoken with him in a over a year which he states hurts quite a bit. Other than this issue he has no other major stress concerns. Orvilla Fus states that he sleeps well overall, with the occasional issue with his CPAP machine. He enjoys reading for stress relief and is looking forward to getting back to playing golf.    Expected Outcomes Short: Continue to manage stress relief through healthy avenues. Long: Continue to maintain positive outlook.    Interventions Encouraged to attend Pulmonary Rehabilitation for the exercise    Continue Psychosocial Services  Follow up required by staff      Initial Review   Source of Stress Concerns Family             Education: Education Goals: Education classes will be provided on a weekly basis, covering required topics. Participant will state understanding/return demonstration of topics presented.  Learning Barriers/Preferences:  Learning Barriers/Preferences - 10/09/22 1022       Learning Barriers/Preferences   Learning Barriers None    Learning Preferences None             General Pulmonary Education Topics:  Infection Prevention: - Provides verbal and written material to individual with discussion of infection control including proper hand washing and proper equipment cleaning during exercise session. Flowsheet Row Pulmonary Rehab from 12/07/2022 in [facility]  Cardiac and Pulmonary Rehab  Date 10/18/22  Educator [facility]  Instruction Review Code 1- Verbalizes Understanding       Falls Prevention: - Provides verbal and written material to individual with discussion of falls prevention and safety. Flowsheet Row Pulmonary Rehab from 12/07/2022 in [facility] Cardiac and Pulmonary Rehab  Date 10/18/22  Educator [facility]  Instruction Review Code 1-  Verbalizes Understanding       Chronic Lung Disease Review: - Group verbal instruction with posters, models, PowerPoint presentations and videos,  to review new updates, new respiratory medications, new advancements in procedures and treatments. Providing information on websites and "800" numbers for continued self-education. Includes information about supplement oxygen, available portable oxygen systems, continuous and intermittent flow rates, oxygen safety, concentrators, and Medicare reimbursement for oxygen. Explanation of Pulmonary Drugs, including class, frequency, complications, importance of spacers, rinsing mouth after steroid MDI's, and proper cleaning methods for nebulizers. Review of basic lung anatomy and physiology related to function, structure, and complications of lung disease. Review of risk factors. Discussion about methods for diagnosing sleep apnea and types of masks and machines for OSA. Includes a review of the use of types of environmental controls: home humidity, furnaces, filters, dust mite/pet prevention, HEPA vacuums. Discussion about weather changes, air quality and the benefits of nasal washing. Instruction on Warning signs, infection symptoms, calling MD promptly, preventive modes, and value of vaccinations. Review of effective airway clearance, coughing and/or vibration techniques. Emphasizing that all should Create an Action Plan. Written material given at graduation. Flowsheet Row Pulmonary Rehab from 12/07/2022 in [facility] Cardiac and Pulmonary Rehab  Education need identified 10/18/22  Date 10/19/22  Educator [facility]  Instruction Review Code 1- Verbalizes Understanding       AED/CPR: - Group verbal and written instruction with the use of models to demonstrate the basic use of the AED with the basic ABC's of resuscitation.    Anatomy and Cardiac Procedures: - Group verbal and visual presentation and models provide information about basic cardiac anatomy and function.  Reviews the testing methods done to diagnose heart disease and the outcomes of the test results. Describes the treatment choices: Medical Management, Angioplasty, or Coronary Bypass Surgery for treating various heart conditions including Myocardial Infarction, Angina, Valve Disease, and Cardiac Arrhythmias.  Written material given at graduation.   Medication Safety: - Group verbal and visual instruction to review commonly prescribed medications for heart and lung disease. Reviews the medication, class of the drug, and side effects. Includes the steps to properly store meds and maintain the prescription regimen.  Written material given at graduation.   Other: -Provides group and verbal instruction on various topics (see comments)   Knowledge Questionnaire Score:  Knowledge Questionnaire Score - 02/01/23 0947       Knowledge Questionnaire Score   Pre Score 15/18    Post Score 15/18              Core Components/Risk Factors/Patient Goals at Admission:  Personal Goals and Risk Factors at Admission - 10/18/22 1251       Core Components/Risk Factors/Patient Goals on Admission    Weight Management Yes;Weight Loss    Intervention Weight Management: Develop a combined nutrition and exercise program designed to reach desired caloric intake, while maintaining appropriate intake of nutrient and fiber, sodium and fats, and appropriate energy expenditure required for the weight goal.;Weight Management: Provide education and appropriate resources to help participant work on and attain dietary goals.;Weight Management/Obesity: Establish reasonable short term and long term weight goals.;Obesity: Provide education  and appropriate resources to help participant work on and attain dietary goals.    Admit Weight 280 lb 6.4 oz (127.2 kg)    Goal Weight: Short Term 270 lb (122.5 kg)    Goal Weight: Long Term 225 lb (102.1 kg)    Expected Outcomes Short Term: Continue to assess and modify interventions  until short term weight is achieved;Long Term: Adherence to nutrition and physical activity/exercise program aimed toward attainment of established weight goal;Weight Loss: Understanding of general recommendations for a balanced deficit meal plan, which promotes 1-2 lb weight loss per week and includes a negative energy balance of [phone] kcal/d;Understanding recommendations for meals to include 15-35% energy as protein, 25-35% energy from fat, 35-60% energy from carbohydrates, less than 200mg  of dietary cholesterol, 20-35 gm of total fiber daily;Understanding of distribution of calorie intake throughout the day with the consumption of 4-5 meals/snacks    Improve shortness of breath with ADL's Yes    Intervention Provide education, individualized exercise plan and daily activity instruction to help decrease symptoms of SOB with activities of daily living.    Expected Outcomes Short Term: Improve cardiorespiratory fitness to achieve a reduction of symptoms when performing ADLs;Long Term: Be able to perform more ADLs without symptoms or delay the onset of symptoms    Diabetes Yes    Intervention Provide education about signs/symptoms and action to take for hypo/hyperglycemia.;Provide education about proper nutrition, including hydration, and aerobic/resistive exercise prescription along with prescribed medications to achieve blood glucose in normal ranges: Fasting glucose 65-99 mg/dL    Expected Outcomes Short Term: Participant verbalizes understanding of the signs/symptoms and immediate care of hyper/hypoglycemia, proper foot care and importance of medication, aerobic/resistive exercise and nutrition plan for blood glucose control.;Long Term: Attainment of HbA1C < 7%.    Hypertension Yes    Intervention Provide education on lifestyle modifcations including regular physical activity/exercise, weight management, moderate sodium restriction and increased consumption of fresh fruit, vegetables, and low fat  dairy, alcohol moderation, and smoking cessation.;Monitor prescription use compliance.    Expected Outcomes Short Term: Continued assessment and intervention until BP is < 140/74mm HG in hypertensive participants. < 130/63mm HG in hypertensive participants with diabetes, heart failure or chronic kidney disease.;Long Term: Maintenance of blood pressure at goal levels.    Lipids Yes    Intervention Provide education and support for participant on nutrition & aerobic/resistive exercise along with prescribed medications to achieve LDL 70mg , HDL >40mg .    Expected Outcomes Short Term: Participant states understanding of desired cholesterol values and is compliant with medications prescribed. Participant is following exercise prescription and nutrition guidelines.;Long Term: Cholesterol controlled with medications as prescribed, with individualized exercise RX and with personalized nutrition plan. Value goals: LDL < 70mg , HDL > 40 mg.             Education:Diabetes - Individual verbal and written instruction to review signs/symptoms of diabetes, desired ranges of glucose level fasting, after meals and with exercise. Acknowledge that pre and post exercise glucose checks will be done for 3 sessions at entry of program. Flowsheet Row Pulmonary Rehab from 12/07/2022 in [facility] Cardiac and Pulmonary Rehab  Date 10/18/22  Educator [facility]  Instruction Review Code 1- Verbalizes Understanding       Know Your Numbers and Heart Failure: - Group verbal and visual instruction to discuss disease risk factors for cardiac and pulmonary disease and treatment options.  Reviews associated critical values for Overweight/Obesity, Hypertension, Cholesterol, and Diabetes.  Discusses basics of heart failure: signs/symptoms and treatments.  Introduces Heart Failure Zone chart for action plan for heart failure.  Written material given at graduation. Flowsheet Row Pulmonary Rehab from 12/07/2022 in [facility] Cardiac and Pulmonary Rehab   Date 10/26/22  Educator [facility]  Instruction Review Code 1- Verbalizes Understanding       Core Components/Risk Factors/Patient Goals Review:   Goals and Risk Factor Review     Row Name 11/09/22 0930 11/30/22 0948 01/02/23 0941 01/18/23 1018 02/20/23 0948     Core Components/Risk Factors/Patient Goals Review   Personal Goals Review Improve shortness of breath with ADL's Improve shortness of breath with ADL's;Diabetes;Weight Management/Obesity Improve shortness of breath with ADL's;Diabetes;Weight Management/Obesity Weight Management/Obesity;Diabetes;Lipids;Hypertension;Improve shortness of breath with ADL's Weight Management/Obesity;Diabetes;Hypertension   Review Spoke to patient about their shortness of breath and what they can do to improve. Patient has been informed of breathing techniques when starting the program. Patient is informed to tell staff if they have had any med changes and that certain meds they are taking or not taking can be causing shortness of breath. Orvilla Fus states that he would still like to lose some weight. He came in today weighing 276 lbs, and would like to lose down to 250 lb. He has stayed busy with taking care of his wife after her surgery and has not been checking his blood sugar levels. He states that he does have a BS monitor, and will return to checking it routinely once his wife is feeling better. He recently saw his pulmonologist who encouraged him to continue diet and exercise to help with SOB. He reports he is losing some weight but not as much as he would like. did dicusss what steady progress looks like. Spoke about holiday season and his plan. He likes sweets and knows he will need to be mindful of them. With his wife recovering from shoulder surgery he has taken on new skills like cooking. He hasnt been checking his blood sugars as much. He has a monitor, set goal today to get back into routine of using the monitor. He feels he is mkaing progress with stamina and  SOB. encouraged him to continue to work on strenght and breathing techiques her at [employer] is concerned he is not losing weight. He is losing inches. Reviewedwith him that inch loss is a healthy indicatator for weight loss.  Losing inches;fat, and gaining muscle mass will help him with his weight loss journey.  Will ask RD to chack in with him to see what nutrition changes may help. HIs blood pressure readings are good, he has improved his cholseterol numbers [id number]  and HDL 42-50.  HIs latest A1C is 6.3%. He is compliant with his meds and is increasing laps walked in program.  He is able to walk 30 laps (100yd a lap) without shortness of breath. Orvilla Fus states he is still working towards his weight goal of 250 lbs. However he weighed in today at 271.8 lb which he states is the lowest he has been in a while. He states that his sugars have been within normal ranges and he is checking those once a week. He does own a BP cuff but only uses it when he is feeling dizzy. We encouraged him to return to checking his BP regularly once he graduates from the program.   Expected Outcomes Short: Attend [facility] regularly to improve shortness of breath with ADL's. Long: maintain independence with ADL's Short: Continue to work towards weight goal with diet and exercise. Long: Continue to manage lifestyle risk factors.  Short: get back into routine of checking BS. Long: continue to manage lifestyle risk factors STG continue his exercise progression, nutrition changes and medication compliance  LTG continue with healthy lifestyle changes Short: Continue to work towards weight goal through diet and exercise. Long: Continue to manage lifestyle risk factors.            Core Components/Risk Factors/Patient Goals at Discharge (Final Review):   Goals and Risk Factor Review - 02/20/23 0948       Core Components/Risk Factors/Patient Goals Review   Personal Goals Review Weight Management/Obesity;Diabetes;Hypertension     Review Orvilla Fus states he is still working towards his weight goal of 250 lbs. However he weighed in today at 271.8 lb which he states is the lowest he has been in a while. He states that his sugars have been within normal ranges and he is checking those once a week. He does own a BP cuff but only uses it when he is feeling dizzy. We encouraged him to return to checking his BP regularly once he graduates from the program.    Expected Outcomes Short: Continue to work towards weight goal through diet and exercise. Long: Continue to manage lifestyle risk factors.             ITP Comments:  ITP Comments     Row Name 10/09/22 1024 10/18/22 1233 10/19/22 1007 10/25/22 1233 11/22/22 0848   ITP Comments Virtual Visit completed. Patient informed on EP and RD appointment and 6 Minute walk test. Patient also informed of patient health questionnaires on My Chart. Patient Verbalizes understanding. Visit diagnosis can be found in [facility] 09/18/2022. Completed and gym orientation. Initial ITP created and sent for review to Dr. Jinny Sanders, Medical Director. First full day of exercise!  Patient was oriented to gym and equipment including functions, settings, policies, and procedures.  Patient's individual exercise prescription and treatment plan were reviewed.  All starting workloads were established based on the results of the 6 minute walk test done at initial orientation visit.  The plan for exercise progression was also introduced and progression will be customized based on patient's performance and goals. 30 Day review completed. Medical Director ITP review done, changes made as directed, and signed approval by Medical Director.    new to program 30 Day review completed. Medical Director ITP review done, changes made as directed, and signed approval by Medical Director.    Row Name 12/13/22 0923 01/10/23 1141         ITP Comments 30 Day review completed. Medical Director ITP review done, changes made as  directed, and signed approval by Medical Director. 30 Day review completed. Medical Director ITP review done, changes made as directed, and signed approval by Medical Director.               Comments:  Usman graduated today from  rehab with 36 sessions completed.  Details of the patient's exercise prescription and what He needs to do in order to continue the prescription and progress were discussed with patient.  Patient was given a copy of prescription and goals.  Patient verbalized understanding. Akin plans to continue to exercise by walking and using his hand weights.

## 2023-03-07 NOTE — Progress Notes (Signed)
Discharge Note for  Nicholas [name]     1945-10-14        Nicholas [name] graduated t1/30/2025 from  rehab with 36 sessions completed.  Details of the patient's exercise prescription and what He needs to do in order to continue the prescription and progress were discussed with patient.  Patient was given a copy of prescription and goals.  Patient verbalized understanding. Nicholas [name] plans to continue to exercise by walking and using his hand weights.      6 Minute Walk     Row Name 10/18/22 1233 01/30/23 0940       6 Minute Walk   Phase Initial Discharge    Distance 825 feet 1260 feet    Distance % Change -- 52.7 %    Distance Feet Change -- 435 ft    Walk Time 6 minutes 6 minutes    # of Rest Breaks 0 0    MPH 1.56 2.39    METS 1.07 1.85    RPE 13 11    Perceived Dyspnea  3 1    VO2 Peak 3.76 6.47    Symptoms Yes (comment) No    Comments SOB --    Resting HR 99 bpm 77 bpm    Resting BP 110/60 116/62    Resting Oxygen Saturation  95 % 93 %    Exercise Oxygen Saturation  during 6 min walk 93 % 94 %    Max Ex. HR 107 bpm 103 bpm    Max Ex. BP 128/70 126/64    2 Minute Post BP 118/76 124/68      Interval HR   1 Minute HR 81 92    2 Minute HR 70 94    3 Minute HR 84 95    4 Minute HR 82 99    5 Minute HR 77 100    6 Minute HR 107 103    2 Minute Post HR 102 96    Interval Heart Rate? Yes Yes      Interval Oxygen   Interval Oxygen? Yes Yes    Baseline Oxygen Saturation % 95 % 93 %    1 Minute Oxygen Saturation % 93 % 96 %    1 Minute Liters of Oxygen 0 L 0 L  RA    2 Minute Oxygen Saturation % 94 % 94 %    2 Minute Liters of Oxygen 0 L 0 L    3 Minute Oxygen Saturation % 93 % 95 %    3 Minute Liters of Oxygen 0 L 0 L    4 Minute Oxygen Saturation % 93 % 94 %    4 Minute Liters of Oxygen 0 L 0 L    5 Minute Oxygen Saturation % 94 % 95 %    5 Minute Liters of Oxygen 0 L 0 L    6 Minute Oxygen Saturation % 94 % 95 %    6 Minute Liters of Oxygen 0 L 0 L    2 Minute Post Oxygen Saturation %  94 % 98 %    2 Minute Post Liters of Oxygen 0 L 0 L

## 2023-03-08 ENCOUNTER — Other Ambulatory Visit: Payer: Self-pay

## 2023-03-08 ENCOUNTER — Encounter (HOSPITAL_COMMUNITY): Payer: Self-pay | Admitting: Cardiology

## 2023-03-08 ENCOUNTER — Ambulatory Visit (HOSPITAL_COMMUNITY)
Admission: RE | Admit: 2023-03-08 | Discharge: 2023-03-08 | Disposition: A | Discharge status: Home / Self Care | Payer: Medicare Other | Source: Ambulatory Visit | Attending: Cardiology | Admitting: Cardiology

## 2023-03-08 DIAGNOSIS — Z950 Presence of cardiac pacemaker: Secondary | ICD-10-CM | POA: Diagnosis present

## 2023-03-08 DIAGNOSIS — T82110A Breakdown (mechanical) of cardiac electrode, initial encounter: Secondary | ICD-10-CM

## 2023-03-08 DIAGNOSIS — I4819 Other persistent atrial fibrillation: Secondary | ICD-10-CM | POA: Diagnosis present

## 2023-03-08 DIAGNOSIS — I442 Atrioventricular block, complete: Secondary | ICD-10-CM

## 2023-03-08 MED ORDER — IOHEXOL 350 MG/ML SOLN
75.0000 mL | Freq: Once | INTRAVENOUS | Status: AC | PRN
  Administered 2023-03-08: 75 mL via INTRAVENOUS

## 2023-03-08 NOTE — Progress Notes (Signed)
PCP - Aura Dials, NP Cardiologist - Dr. Sherryl Manges EP- Dr. Steffanie Dunn  PPM/ICD - [employer] PPM Device Orders - n/a- notified by office   Chest x-ray - 09/18/22- CE EKG - 02/16/23 Stress Test - 03/13/17 ECHO - 04/06/22 Cardiac Cath - 2005  CPAP - OSA+, CPAP nightly, unsure of pressure settings  Fasting Blood Sugar - 140-150 Pt does not check CBG often at home (maybe once a week)  Blood Thinner Instructions:  Hold Eliquis 3 days. Last dose 2/13. Aspirin Instructions: n/a  ERAS Protcol - no, NPO  COVID TEST- n/a  Anesthesia review: yes, cardiac hx  Patient verbally denies any shortness of breath, fever, cough and chest pain during phone call      Questions were answered. Patient verbalized understanding of instructions.

## 2023-03-12 ENCOUNTER — Ambulatory Visit (HOSPITAL_COMMUNITY): Payer: Medicare Other

## 2023-03-12 ENCOUNTER — Encounter (HOSPITAL_COMMUNITY): Payer: Self-pay | Admitting: Cardiology

## 2023-03-12 ENCOUNTER — Encounter (HOSPITAL_COMMUNITY)
Admission: RE | Disposition: A | Discharge status: Home / Self Care | Payer: Medicare Other | Source: Home / Self Care | Attending: Cardiology

## 2023-03-12 ENCOUNTER — Ambulatory Visit (HOSPITAL_COMMUNITY)
Admission: RE | Admit: 2023-03-12 | Discharge: 2023-03-13 | Disposition: A | Discharge status: Home / Self Care | Payer: Medicare Other | Attending: Cardiology | Admitting: Cardiology

## 2023-03-12 ENCOUNTER — Other Ambulatory Visit: Payer: Self-pay

## 2023-03-12 DIAGNOSIS — T82110A Breakdown (mechanical) of cardiac electrode, initial encounter: Secondary | ICD-10-CM

## 2023-03-12 DIAGNOSIS — I4819 Other persistent atrial fibrillation: Secondary | ICD-10-CM | POA: Insufficient documentation

## 2023-03-12 DIAGNOSIS — X58XXXA Exposure to other specified factors, initial encounter: Secondary | ICD-10-CM | POA: Diagnosis not present

## 2023-03-12 DIAGNOSIS — K219 Gastro-esophageal reflux disease without esophagitis: Secondary | ICD-10-CM | POA: Diagnosis not present

## 2023-03-12 DIAGNOSIS — M1990 Unspecified osteoarthritis, unspecified site: Secondary | ICD-10-CM | POA: Insufficient documentation

## 2023-03-12 DIAGNOSIS — T82118A Breakdown (mechanical) of other cardiac electronic device, initial encounter: Secondary | ICD-10-CM

## 2023-03-12 DIAGNOSIS — G4733 Obstructive sleep apnea (adult) (pediatric): Secondary | ICD-10-CM | POA: Diagnosis not present

## 2023-03-12 DIAGNOSIS — Z86711 Personal history of pulmonary embolism: Secondary | ICD-10-CM | POA: Diagnosis not present

## 2023-03-12 DIAGNOSIS — Z01818 Encounter for other preprocedural examination: Secondary | ICD-10-CM

## 2023-03-12 DIAGNOSIS — I442 Atrioventricular block, complete: Secondary | ICD-10-CM | POA: Insufficient documentation

## 2023-03-12 DIAGNOSIS — I10 Essential (primary) hypertension: Secondary | ICD-10-CM | POA: Diagnosis not present

## 2023-03-12 DIAGNOSIS — Z7901 Long term (current) use of anticoagulants: Secondary | ICD-10-CM | POA: Diagnosis not present

## 2023-03-12 DIAGNOSIS — Z7984 Long term (current) use of oral hypoglycemic drugs: Secondary | ICD-10-CM | POA: Insufficient documentation

## 2023-03-12 DIAGNOSIS — F418 Other specified anxiety disorders: Secondary | ICD-10-CM | POA: Diagnosis not present

## 2023-03-12 DIAGNOSIS — Z950 Presence of cardiac pacemaker: Secondary | ICD-10-CM | POA: Diagnosis present

## 2023-03-12 DIAGNOSIS — E119 Type 2 diabetes mellitus without complications: Secondary | ICD-10-CM | POA: Insufficient documentation

## 2023-03-12 DIAGNOSIS — Z8711 Personal history of peptic ulcer disease: Secondary | ICD-10-CM | POA: Diagnosis not present

## 2023-03-12 HISTORY — DX: Type 2 diabetes mellitus without complications: E11.9

## 2023-03-12 HISTORY — PX: PACEMAKER IMPLANT: EP1218

## 2023-03-12 HISTORY — PX: LEAD EXTRACTION: EP1211

## 2023-03-12 HISTORY — DX: Gastro-esophageal reflux disease without esophagitis: K21.9

## 2023-03-12 LAB — ECHO INTRAOPERATIVE TEE
Height: 71 in
Weight: 4368 [oz_av]

## 2023-03-12 LAB — ABO/RH: ABO/RH(D): A NEG

## 2023-03-12 LAB — SURGICAL PCR SCREEN
MRSA, PCR: NEGATIVE
Staphylococcus aureus: NEGATIVE

## 2023-03-12 LAB — GLUCOSE, CAPILLARY
Glucose-Capillary: 144 mg/dL — ABNORMAL HIGH (ref 70–99)
Glucose-Capillary: 136 mg/dL — ABNORMAL HIGH (ref 70–99)

## 2023-03-12 LAB — PREPARE RBC (CROSSMATCH)

## 2023-03-12 SURGERY — LEAD EXTRACTION
Anesthesia: General

## 2023-03-12 MED ORDER — HYDROCODONE-ACETAMINOPHEN 5-325 MG PO TABS
1.0000 | ORAL_TABLET | Freq: Four times a day (QID) | ORAL | Status: DC | PRN
  Administered 2023-03-12 – 2023-03-13 (×3): 1 via ORAL
  Filled 2023-03-12 (×2): qty 1

## 2023-03-12 MED ORDER — ONDANSETRON HCL 4 MG/2ML IJ SOLN: 4.0000 mg | Freq: Four times a day (QID) | INTRAMUSCULAR | Status: DC | PRN

## 2023-03-12 MED ORDER — ROPINIROLE HCL 0.25 MG PO TABS
0.2500 mg | ORAL_TABLET | Freq: Four times a day (QID) | ORAL | Status: DC
Start: 2023-03-13 — End: 2023-03-13
  Administered 2023-03-13: 0.25 mg via ORAL
  Filled 2023-03-12 (×2): qty 1

## 2023-03-12 MED ORDER — HYDROCODONE-ACETAMINOPHEN 5-325 MG PO TABS
ORAL_TABLET | ORAL | Status: AC
  Filled 2023-03-12: qty 1

## 2023-03-12 MED ORDER — HEPARIN (PORCINE) IN NACL 1000-0.9 UT/500ML-% IV SOLN
INTRAVENOUS | Status: DC | PRN
  Administered 2023-03-12: 500 mL

## 2023-03-12 MED ORDER — POVIDONE-IODINE 10 % EX SWAB: 2.0000 | Freq: Once | CUTANEOUS | Status: DC

## 2023-03-12 MED ORDER — MAGNESIUM OXIDE -MG SUPPLEMENT 400 (240 MG) MG PO TABS
400.0000 mg | ORAL_TABLET | Freq: Every day | ORAL | Status: DC
  Administered 2023-03-13: 400 mg via ORAL
  Filled 2023-03-12: qty 1

## 2023-03-12 MED ORDER — SODIUM CHLORIDE 0.9 % IV SOLN: INTRAVENOUS | Status: DC

## 2023-03-12 MED ORDER — ROCURONIUM BROMIDE 10 MG/ML (PF) SYRINGE
PREFILLED_SYRINGE | INTRAVENOUS | Status: DC | PRN
  Administered 2023-03-12: 50 mg via INTRAVENOUS
  Administered 2023-03-12: 30 mg via INTRAVENOUS

## 2023-03-12 MED ORDER — SUGAMMADEX SODIUM 200 MG/2ML IV SOLN
INTRAVENOUS | Status: DC | PRN
  Administered 2023-03-12: 200 mg via INTRAVENOUS
  Administered 2023-03-12: 100 mg via INTRAVENOUS

## 2023-03-12 MED ORDER — SODIUM CHLORIDE 0.9 % IV SOLN
80.0000 mg | INTRAVENOUS | Status: AC
  Administered 2023-03-12: 80 mg

## 2023-03-12 MED ORDER — PANTOPRAZOLE SODIUM 20 MG PO TBEC
20.0000 mg | DELAYED_RELEASE_TABLET | Freq: Every day | ORAL | Status: DC
  Administered 2023-03-13: 20 mg via ORAL
  Filled 2023-03-12: qty 1

## 2023-03-12 MED ORDER — CHLORHEXIDINE GLUCONATE 4 % EX SOLN: 4.0000 | Freq: Once | CUTANEOUS | Status: DC

## 2023-03-12 MED ORDER — LORAZEPAM 1 MG PO TABS
1.0000 mg | ORAL_TABLET | Freq: Every day | ORAL | Status: DC
  Administered 2023-03-12: 1 mg via ORAL
  Filled 2023-03-12: qty 1

## 2023-03-12 MED ORDER — CHLORHEXIDINE GLUCONATE 0.12 % MT SOLN
OROMUCOSAL | Status: AC
Start: 2023-03-12 — End: 2023-03-12
  Administered 2023-03-12: 15 mL
  Filled 2023-03-12: qty 15

## 2023-03-12 MED ORDER — EMPAGLIFLOZIN 25 MG PO TABS
25.0000 mg | ORAL_TABLET | Freq: Every day | ORAL | Status: DC
  Administered 2023-03-13: 25 mg via ORAL
  Filled 2023-03-12: qty 1

## 2023-03-12 MED ORDER — FENTANYL CITRATE (PF) 250 MCG/5ML IJ SOLN
INTRAMUSCULAR | Status: DC | PRN
Start: 2023-03-12 — End: 2023-03-12
  Administered 2023-03-12 (×3): 50 ug via INTRAVENOUS

## 2023-03-12 MED ORDER — PHENYLEPHRINE HCL-NACL 20-0.9 MG/250ML-% IV SOLN
INTRAVENOUS | Status: DC | PRN
  Administered 2023-03-12: 30 ug/min via INTRAVENOUS

## 2023-03-12 MED ORDER — VENLAFAXINE HCL ER 75 MG PO CP24
225.0000 mg | ORAL_CAPSULE | Freq: Every day | ORAL | Status: DC
  Administered 2023-03-13: 225 mg via ORAL
  Filled 2023-03-12: qty 3

## 2023-03-12 MED ORDER — PHENYLEPHRINE HCL (PRESSORS) 10 MG/ML IV SOLN
INTRAVENOUS | Status: DC | PRN
  Administered 2023-03-12: 320 ug via INTRAVENOUS
  Administered 2023-03-12 (×2): 80 ug via INTRAVENOUS

## 2023-03-12 MED ORDER — SODIUM CHLORIDE 0.9% IV SOLUTION: Freq: Once | INTRAVENOUS | Status: AC

## 2023-03-12 MED ORDER — CEFAZOLIN SODIUM-DEXTROSE 3-4 GM/150ML-% IV SOLN
3.0000 g | INTRAVENOUS | Status: AC
  Administered 2023-03-12: 3 g via INTRAVENOUS
  Filled 2023-03-12: qty 150

## 2023-03-12 MED ORDER — LIDOCAINE 2% (20 MG/ML) 5 ML SYRINGE
INTRAMUSCULAR | Status: DC | PRN
  Administered 2023-03-12: 60 mg via INTRAVENOUS

## 2023-03-12 MED ORDER — MIDODRINE HCL 5 MG PO TABS
5.0000 mg | ORAL_TABLET | Freq: Three times a day (TID) | ORAL | Status: DC
  Administered 2023-03-13: 5 mg via ORAL
  Filled 2023-03-12: qty 1

## 2023-03-12 MED ORDER — HYDRALAZINE HCL 10 MG PO TABS
20.0000 mg | ORAL_TABLET | Freq: Four times a day (QID) | ORAL | Status: DC | PRN
  Administered 2023-03-12: 20 mg via ORAL
  Filled 2023-03-12 (×2): qty 2

## 2023-03-12 MED ORDER — ONDANSETRON HCL 4 MG/2ML IJ SOLN
INTRAMUSCULAR | Status: DC | PRN
  Administered 2023-03-12: 4 mg via INTRAVENOUS

## 2023-03-12 MED ORDER — GABAPENTIN 300 MG PO CAPS
600.0000 mg | ORAL_CAPSULE | Freq: Every day | ORAL | Status: DC
Start: 2023-03-12 — End: 2023-03-13
  Administered 2023-03-12: 600 mg via ORAL
  Filled 2023-03-12: qty 2

## 2023-03-12 MED ORDER — PROPOFOL 10 MG/ML IV BOLUS
INTRAVENOUS | Status: DC | PRN
  Administered 2023-03-12: 100 mg via INTRAVENOUS
  Administered 2023-03-12: 20 mg via INTRAVENOUS
  Administered 2023-03-12: 30 mg via INTRAVENOUS

## 2023-03-12 MED ORDER — ESMOLOL HCL 100 MG/10ML IV SOLN
INTRAVENOUS | Status: DC | PRN
  Administered 2023-03-12: 30 mg via INTRAVENOUS

## 2023-03-12 MED ORDER — QUETIAPINE FUMARATE ER 50 MG PO TB24
150.0000 mg | ORAL_TABLET | Freq: Every day | ORAL | Status: DC
  Administered 2023-03-12: 150 mg via ORAL
  Filled 2023-03-12: qty 3

## 2023-03-12 MED ORDER — CEFAZOLIN SODIUM-DEXTROSE 2-4 GM/100ML-% IV SOLN
INTRAVENOUS | Status: DC
Start: 2023-03-12 — End: 2023-03-12
  Filled 2023-03-12: qty 100

## 2023-03-12 MED ORDER — ACETAMINOPHEN 325 MG PO TABS: 325.0000 mg | ORAL_TABLET | ORAL | Status: DC | PRN

## 2023-03-12 MED ORDER — INSULIN ASPART 100 UNIT/ML IJ SOLN: 0.0000 [IU] | INTRAMUSCULAR | Status: DC | PRN

## 2023-03-12 MED ORDER — PHENYLEPHRINE 80 MCG/ML (10ML) SYRINGE FOR IV PUSH (FOR BLOOD PRESSURE SUPPORT)
PREFILLED_SYRINGE | INTRAVENOUS | Status: DC | PRN
  Administered 2023-03-12: 80 ug via INTRAVENOUS
  Administered 2023-03-12: 60 ug via INTRAVENOUS

## 2023-03-12 MED ORDER — SODIUM CHLORIDE 0.9 % IV SOLN
INTRAVENOUS | Status: AC
  Filled 2023-03-12: qty 2

## 2023-03-12 MED ORDER — FENTANYL CITRATE (PF) 100 MCG/2ML IJ SOLN
INTRAMUSCULAR | Status: AC
  Filled 2023-03-12: qty 2

## 2023-03-12 MED ORDER — METOPROLOL TARTRATE 5 MG/5ML IV SOLN
INTRAVENOUS | Status: DC | PRN
Start: 2023-03-12 — End: 2023-03-12
  Administered 2023-03-12: 2.5 mg via INTRAVENOUS

## 2023-03-12 SURGICAL SUPPLY — 26 items
BALLN OCL BRIDGE 80X90X.9 60 (BALLOONS) ×1 IMPLANT
BALLOON OCL BRIDGE 80X90X.9 60 (BALLOONS) IMPLANT
BRIDGE PREP KIT (KITS) ×1 IMPLANT
CABLE ADAPT PACING TEMP 12FT (ADAPTER) IMPLANT
CABLE SURGICAL S-101-97-12 (CABLE) ×1 IMPLANT
CATH RIGHTSITE C315HIS02 (CATHETERS) IMPLANT
CATH S G BIP PACING (CATHETERS) IMPLANT
CLOSURE PERCLOSE PROSTYLE (VASCULAR PRODUCTS) IMPLANT
IPG PACE AZUR XT DR MRI W1DR01 (Pacemaker) IMPLANT
KIT BRIDGE PREP (KITS) IMPLANT
KIT WRENCH (KITS) IMPLANT
LEAD SELECT SECURE 3830 383069 (Lead) IMPLANT
MAT PREVALON FULL STRYKER (MISCELLANEOUS) IMPLANT
PACE AZURE XT DR MRI W1DR01 (Pacemaker) ×1 IMPLANT
PAD DEFIB RADIO PHYSIO CONN (PAD) ×1 IMPLANT
POUCH AIGIS-R ANTIBACT PPM (Mesh General) ×1 IMPLANT
POUCH AIGIS-R ANTIBACT PPM MED (Mesh General) IMPLANT
SELECT SECURE 3830 383069 (Lead) ×1 IMPLANT
SHEATH 7FR PRELUDE SNAP 13 (SHEATH) IMPLANT
SHEATH 7FR PRELUDE SNAP 25 (SHEATH) IMPLANT
SHEATH LASER 14 FR GLIDELIGHT (SHEATH) IMPLANT
SHEATH PROBE COVER 6X72 (BAG) IMPLANT
SHEATH TIGHTRAIL MECH 11F (SHEATH) IMPLANT
SLITTER 6232ADJ (MISCELLANEOUS) IMPLANT
TRAY PACEMAKER INSERTION (PACKS) ×1 IMPLANT
WIRE HI TORQ VERSACORE-J 145CM (WIRE) IMPLANT

## 2023-03-12 NOTE — Progress Notes (Signed)
Arm sling placed on left arm.

## 2023-03-12 NOTE — Anesthesia Preprocedure Evaluation (Addendum)
 Anesthesia Evaluation  Patient identified by MRN, date of birth, ID band Patient awake    Reviewed: Allergy & Precautions, NPO status , Patient's Chart, lab work & pertinent test results  Airway Mallampati: II  TM Distance: >3 FB Neck ROM: Full    Dental no notable dental hx.    Pulmonary sleep apnea , PE   Pulmonary exam normal        Cardiovascular hypertension, + dysrhythmias + pacemaker  Rhythm:Regular Rate:Normal  ECHO 03/24:  1. Left ventricular ejection fraction, by estimation, is 60 to 65%. Left  ventricular ejection fraction by PLAX is 52 %. The left ventricle has  normal function. The left ventricle has no regional wall motion  abnormalities. There is mild left ventricular  hypertrophy. Left ventricular diastolic parameters are indeterminate.   2. Right ventricular systolic function is normal. The right ventricular  size is normal.   3. The mitral valve is normal in structure. No evidence of mitral valve  regurgitation. No evidence of mitral stenosis.   4. The aortic valve is normal in structure. Aortic valve regurgitation is  not visualized. Aortic valve sclerosis is present, with no evidence of  aortic valve stenosis.   5. There is mild dilatation of the ascending aorta, measuring 40 mm.  There is borderline dilatation of the aortic root, measuring 39 mm.   6. The inferior vena cava is normal in size with greater than 50%  respiratory variability, suggesting right atrial pressure of 3 mmHg.   Comparison(s): Previous Echo showed LV EF 60-65%, no RWMA, grade I  diastolic dysfunction, aortic root 39mm, ascending aorta 41mm.     Neuro/Psych   Anxiety Depression    negative neurological ROS     GI/Hepatic Neg liver ROS, PUD,GERD  ,,  Endo/Other  diabetes    Renal/GU   negative genitourinary   Musculoskeletal  (+) Arthritis , Osteoarthritis,    Abdominal Normal abdominal exam  (+)   Peds  Hematology Lab  Results      Component                Value               Date                      WBC                      7.5                 02/28/2023                HGB                      14.7                02/28/2023                HCT                      43.4                02/28/2023                MCV                      93  02/28/2023                PLT                      175                 02/28/2023              Anesthesia Other Findings   Reproductive/Obstetrics                             Anesthesia Physical Anesthesia Plan  ASA: 3  Anesthesia Plan: General   Post-op Pain Management:    Induction: Intravenous  PONV Risk Score and Plan: 2 and Ondansetron, Dexamethasone and Treatment may vary due to age or medical condition  Airway Management Planned: Mask and Oral ETT  Additional Equipment: None  Intra-op Plan:   Post-operative Plan: Extubation in OR  Informed Consent: I have reviewed the patients History and Physical, chart, labs and discussed the procedure including the risks, benefits and alternatives for the proposed anesthesia with the patient or authorized representative who has indicated his/her understanding and acceptance.     Dental advisory given  Plan Discussed with: CRNA  Anesthesia Plan Comments: (TEE MONITORING)       Anesthesia Quick Evaluation

## 2023-03-12 NOTE — Plan of Care (Signed)
  Problem: Education: Goal: Knowledge of cardiac device and self-care will improve Outcome: Progressing   Problem: Cardiac: Goal: Ability to achieve and maintain adequate cardiopulmonary perfusion will improve Outcome: Progressing   Problem: Education: Goal: Knowledge of General Education information will improve Description: Including pain rating scale, medication(s)/side effects and non-pharmacologic comfort measures Outcome: Progressing   

## 2023-03-12 NOTE — Transfer of Care (Cosign Needed)
 Immediate Anesthesia Transfer of Care Note  Patient: Nicholas [name]  Procedure(s) Performed: LEAD EXTRACTION PACEMAKER IMPLANT  Patient Location: PACU and Cath Lab  Anesthesia Type:General  Level of Consciousness: awake, oriented, and patient cooperative  Airway & Oxygen Therapy: Patient Spontanous Breathing and Patient connected to face mask oxygen  Post-op Assessment: Report given to RN and Post -op Vital signs reviewed and stable  Post vital signs: Reviewed and stable  Last Vitals:  Vitals Value Taken Time  BP 189/75   Temp    Pulse 68 03/12/23 1411  Resp 14 03/12/23 1411  SpO2 98 % 03/12/23 1411  Vitals shown include unfiled device data.  Last Pain:  Vitals:   03/12/23 0928  TempSrc:   PainSc: 0-No pain         Complications: There were no known notable events for this encounter.

## 2023-03-12 NOTE — Progress Notes (Signed)
  Echocardiogram Echocardiogram Transesophageal has been performed.  Janalyn Harder 03/12/2023, 2:12 PM

## 2023-03-12 NOTE — Anesthesia Procedure Notes (Signed)
 Procedure Name: Intubation Date/Time: 03/12/2023 11:34 AM  Performed by: Kayleen Memos, CRNAPre-anesthesia Checklist: Patient identified, Emergency Drugs available, Suction available and Patient being monitored Patient Re-evaluated:Patient Re-evaluated prior to induction Oxygen Delivery Method: Circle system utilized Preoxygenation: Pre-oxygenation with 100% oxygen Induction Type: IV induction Ventilation: Mask ventilation without difficulty Laryngoscope Size: Mac and 3 Grade View: Grade I Tube type: Oral Tube size: 7.5 mm Number of attempts: 1 Airway Equipment and Method: Stylet and Oral airway Placement Confirmation: ETT inserted through vocal cords under direct vision, positive ETCO2 and breath sounds checked- equal and bilateral Secured at: 23 cm Tube secured with: Tape Dental Injury: Teeth and Oropharynx as per pre-operative assessment

## 2023-03-12 NOTE — Anesthesia Procedure Notes (Signed)
Anesthesia Procedure Note     

## 2023-03-12 NOTE — TOC CM/SW Note (Signed)
 Transition of Care [facility]) - Inpatient Brief Assessment   Patient Details  Name: Nicholas [name] MRN: [id number] Date of Birth: 21-Sep-1945  Transition of Care [facility]) CM/SW Contact:    Leone Haven, RN Phone Number: 03/12/2023, 4:17 PM   Clinical Narrative: From home with spouse, has PCP, Dr. Jolene [place]  and insurance on file, states has no [facility] services in place at this time , has cpap machine and lift chair at home.  States family member will transport them home at [employer] and family is support system, states gets medications from [facility] in [place].  Pta self ambulatory.    Transition of Care Asessment: Insurance and Status: Insurance coverage has been reviewed Patient has primary care physician: Yes Home environment has been reviewed: yes Prior level of function:: indep Prior/Current Home Services: No current home services Social Drivers of Health Review: SDOH reviewed no interventions necessary Readmission risk has been reviewed: Yes Transition of care needs: no transition of care needs at this time

## 2023-03-12 NOTE — Interval H&P Note (Signed)
 History and Physical Interval Note:  03/12/2023 10:37 AM  Nicholas [name]  has presented today for surgery, with the diagnosis of lead malfunction.  The various methods of treatment have been discussed with the patient and family. After consideration of risks, benefits and other options for treatment, the patient has consented to  Procedure(s): LEAD EXTRACTION (N/A) PPM GENERATOR CHANGEOUT (N/A) as a surgical intervention.  The patient's history has been reviewed, patient examined, no change in status, stable for surgery.  I have reviewed the patient's chart and labs.  Questions were answered to the patient's satisfaction.     Josee Speece T Ailyne Pawley

## 2023-03-12 NOTE — Anesthesia Procedure Notes (Signed)
 Arterial Line Insertion Start/End2/17/2025 10:25 AM, 03/12/2023 10:30 AM Performed by: Ulla Potash, RN  Patient location: Pre-op. Preanesthetic checklist: patient identified, IV checked, site marked, risks and benefits discussed, surgical consent, monitors and equipment checked, pre-op evaluation, timeout performed and anesthesia consent Lidocaine 1% used for infiltration Right, radial was placed Catheter size: 20 G Hand hygiene performed  and maximum sterile barriers used   Attempts: 1 Procedure performed without using ultrasound guided technique. Following insertion, dressing applied and Biopatch. Post procedure assessment: normal and unchanged  Patient tolerated the procedure well with no immediate complications.

## 2023-03-13 ENCOUNTER — Ambulatory Visit (HOSPITAL_COMMUNITY): Payer: Medicare Other

## 2023-03-13 DIAGNOSIS — Z950 Presence of cardiac pacemaker: Secondary | ICD-10-CM

## 2023-03-13 DIAGNOSIS — T82110A Breakdown (mechanical) of cardiac electrode, initial encounter: Secondary | ICD-10-CM | POA: Diagnosis not present

## 2023-03-13 DIAGNOSIS — I10 Essential (primary) hypertension: Secondary | ICD-10-CM | POA: Diagnosis not present

## 2023-03-13 DIAGNOSIS — I4819 Other persistent atrial fibrillation: Secondary | ICD-10-CM | POA: Diagnosis not present

## 2023-03-13 DIAGNOSIS — I442 Atrioventricular block, complete: Secondary | ICD-10-CM | POA: Diagnosis not present

## 2023-03-13 NOTE — Discharge Instructions (Signed)
 After Your Pacemaker   You have a Abbott Pacemaker  ACTIVITY Do not lift your arm above shoulder height for 1 week after your procedure. After 7 days, you may progress as below.  You should remove your sling 24 hours after your procedure, unless otherwise instructed by your provider.     Tuesday March 20, 2023  Wednesday March 21, 2023 Thursday March 22, 2023 Friday March 23, 2023   Do not lift, push, pull, or carry anything over 10 pounds with the affected arm until 6 weeks (Tuesday April 24, 2023 ) after your procedure.   You may drive AFTER your wound check, unless you have been told otherwise by your provider.   Ask your healthcare provider when you can go back to work   INCISION/Dressing HOLD your Eliquis.  You may resume on 2/23 am.   Do not remove steri-strips.    Monitor your Pacemaker site for redness, swelling, and drainage. Call the device clinic at [phone] if you experience these symptoms or fever/chills.  If your incision is sealed with Steri-strips or staples, you may shower 7 days after your procedure or when told by your provider. Do not remove the steri-strips or let the shower hit directly on your site. You may wash around your site with soap and water.    If you were discharged in a sling, please do not wear this during the day more than 48 hours after your surgery unless otherwise instructed. This may increase the risk of stiffness and soreness in your shoulder.   Avoid lotions, ointments, or perfumes over your incision until it is well-healed.  You may use a hot tub or a pool AFTER your wound check appointment if the incision is completely closed.  Pacemaker Alerts:  Some alerts are vibratory and others beep. These are NOT emergencies. Please call our office to let us know. If this occurs at night or on weekends, it can wait until the next business day. Send a remote transmission.  If your device is capable of reading fluid status (for heart  failure), you will be offered monthly monitoring to review this with you.   DEVICE MANAGEMENT Remote monitoring is used to monitor your pacemaker from home. This monitoring is scheduled every 91 days by our office. It allows [place] to keep an eye on the functioning of your device to ensure it is working properly. You will routinely see your Electrophysiologist annually (more often if necessary).   You should receive your ID card for your new device in 4-8 weeks. Keep this card with you at all times once received. Consider wearing a medical alert bracelet or necklace.  Your Pacemaker may be MRI compatible. This will be discussed at your next office visit/wound check.  You should avoid contact with strong electric or magnetic fields.   Do not use amateur (ham) radio equipment or electric (arc) welding torches. MP3 player headphones with magnets should not be used. Some devices are safe to use if held at least 12 inches (30 cm) from your Pacemaker. These include power tools, lawn mowers, and speakers. If you are unsure if something is safe to use, ask your health care provider.  When using your cell phone, hold it to the ear that is on the opposite side from the Pacemaker. Do not leave your cell phone in a pocket over the Pacemaker.  You may safely use electric blankets, heating pads, computers, and microwave ovens.  Call the office right away if: You have chest pain.  You feel more short of breath than you have felt before. You feel more light-headed than you have felt before. Your incision starts to open up.  This information is not intended to replace advice given to you by your health care provider. Make sure you discuss any questions you have with your health care provider.

## 2023-03-13 NOTE — Anesthesia Postprocedure Evaluation (Signed)
 Anesthesia Post Note  Patient: Nicholas [name]  Procedure(s) Performed: LEAD EXTRACTION PACEMAKER IMPLANT     Patient location during evaluation: PACU Anesthesia Type: General Level of consciousness: awake and alert Pain management: pain level controlled Vital Signs Assessment: post-procedure vital signs reviewed and stable Respiratory status: spontaneous breathing, nonlabored ventilation, respiratory function stable and patient connected to nasal cannula oxygen Cardiovascular status: blood pressure returned to baseline and stable Postop Assessment: no apparent nausea or vomiting Anesthetic complications: no   There were no known notable events for this encounter.  Last Vitals:  Vitals:   03/13/23 0740 03/13/23 1104  BP: (!) 131/56 (!) 136/58  Pulse: 80 76  Resp: 18 18  Temp: 36.8 C 37.1 C  SpO2: 92% 95%    Last Pain:  Vitals:   03/13/23 1104  TempSrc: Oral  PainSc:                  Nelle Don Keltie Labell

## 2023-03-13 NOTE — Progress Notes (Signed)
 TCTS:  I provided surgical backup in the cath lab for pacemaker lead extraction and revision by Dr. Lalla Brothers for 1 hr, 31 minutes.  Alleen Borne, MD

## 2023-03-13 NOTE — TOC Transition Note (Signed)
 Transition of Care [facility]) - Discharge Note   Patient Details  Name: Nicholas [name] MRN: [id number] Date of Birth: 08-15-1945  Transition of Care [facility]) CM/SW Contact:  Leone Haven, RN Phone Number: 03/13/2023, 10:56 AM   Clinical Narrative:    For dc today, has no needs.          Patient Goals and CMS Choice            Discharge Placement                       Discharge Plan and Services Additional resources added to the After Visit Summary for                                       Social Drivers of Health (SDOH) Interventions SDOH Screenings   Food Insecurity: No Food Insecurity (09/28/2022)  Housing: Low Risk  (09/28/2022)  Transportation Needs: No Transportation Needs (09/28/2022)  Utilities: Not At Risk (06/06/2022)  Alcohol Screen: Low Risk  (09/28/2022)  Depression (PHQ2-9): Low Risk  (02/01/2023)  Recent Concern: Depression (PHQ2-9) - Medium Risk (11/09/2022)  Financial Resource Strain: Low Risk  (09/28/2022)  Physical Activity: Inactive (09/28/2022)  Social Connections: Unknown (09/28/2022)  Stress: Stress Concern Present (09/28/2022)  Tobacco Use: Low Risk  (03/12/2023)  Health Literacy: Adequate Health Literacy (09/14/2022)     Readmission Risk Interventions     No data to display

## 2023-03-13 NOTE — Discharge Summary (Addendum)
 ELECTROPHYSIOLOGY PROCEDURE DISCHARGE SUMMARY    Patient ID: Nicholas [name],  MRN: [id number], DOB/AGE: 05/22/1945 78 y.o.  Admit date: 03/12/2023 Discharge date: 03/13/2023  Primary Care Physician: Marjie Skiff, NP  Primary Cardiologist: Sherryl Manges, MD  Electrophysiologist: Dr. 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   Medtronic RV Lead Revision due to progressively worsening exit block  There were no immediate post procedure complications.   2.  CXR on 03/13/2023 demonstrated no pneumothorax status post device implantation.       Brief HPI: Nicholas [name] is a 78 y.o. male was referred to electrophysiology in the outpatient setting for  consideration of RV lead revision in the setting of lead failure with progressive exit block.  Past medical history includes CHB s/p PPM, persistent AF s/p PVI ablation, orthostatic hypotension, PE, OSA on CPAP.   Risks, benefits, and alternatives to PPM implantation were reviewed with the patient who wished to proceed.   Hospital Course:  The patient was admitted and underwent a Medtronic  RV lead revision . The patient undwerwent RV lead revision with TCTS back up on 03/12/23 without complications.  He was monitored on telemetry overnight which demonstrated appropriate pacing.  Left chest was without hematoma or ecchymosis.  The device was interrogated and found to be functioning normally.  CXR was obtained and demonstrated no pneumothorax status post device implantation.  Wound care, arm mobility, and restrictions were reviewed with the patient.  The patient was examined and considered stable for discharge to home.    Anticoagulation resumption This patient should resume their Eliquis on  03/18/23 AM.     Physical Exam: Vitals:    03/13/23 0016 03/13/23 0432 03/13/23 0740 03/13/23 1104  BP: (!) 142/64 123/60 (!) 131/56 (!) 136/58  Pulse: 83 78 80 76  Resp: 19 16 18 18   Temp: 99.6 F (37.6 C) 98.5 F (36.9 C) 98.3 F (36.8 C) 98.7 F (37.1 C)  TempSrc: Oral Oral Oral Oral  SpO2: 93% 92% 92% 95%  Weight:  123.9 kg    Height:        GEN- NAD. A&O x 3.  HEENT: Normocephalic, atraumatic Lungs- CTAB, Normal effort.  Heart- RRR, No M/G/R. Left chest implant site clean/dry/intact, steri strips in place without bleeding, hematoma or erythema  GI- Soft, NT, ND.  Extremities- No clubbing, cyanosis, or edema;  Skin- warm and dry, no rash or lesion, left chest without hematoma/ecchymosis  Discharge Medications:  Allergies as of 03/13/2023   No Known Allergies      Medication List     TAKE these medications    acetaminophen 650 MG CR tablet Commonly known as: TYLENOL Take 650 mg by mouth every 8 (eight) hours as needed for pain.   albuterol 108 (90 Base) MCG/ACT inhaler Commonly known as: VENTOLIN HFA INHALE 2 PUFFS BY MOUTH EVERY 6 HOURS AS NEEDED   apixaban 5 MG Tabs tablet Commonly known as: Eliquis Take 1 tablet (5 mg total) by mouth 2 (two) times daily. Notes to patient: HOLD.  Ok to restart on 2/23 AM.    calcium-vitamin D 500-5 MG-MCG tablet Commonly known as: OSCAL WITH D Take 1 tablet by mouth daily with breakfast.   Cranberry 500 MG Caps Take 500 mg  by mouth daily.   empagliflozin 25 MG Tabs tablet Commonly known as: Jardiance Take 1 tablet (25 mg total) by mouth daily.   Fish Oil 1200 MG Caps Take 1,200 mg by mouth daily.   gabapentin 600 MG tablet Commonly known as: NEURONTIN TAKE 1 TABLET BY MOUTH EVERYDAY AT BEDTIME   glucose blood test strip Use to check blood sugar 3 times a day and document results, bring to appointments.  Goal is <130 fasting blood sugar and <180 two hours after meals.   HYDROcodone-acetaminophen 5-325 MG tablet Commonly known as: NORCO/VICODIN Take 1  tablet by mouth 2 (two) times daily. Start taking on: March 23, 2023   LORazepam 1 MG tablet Commonly known as: ATIVAN TAKE 1 TABLET BY MOUTH EVERYDAY AT BEDTIME   Magnesium 400 MG Tabs Take 400 mg by mouth daily.   MELATONIN PO Take 10 mg by mouth at bedtime.   midodrine 5 MG tablet Commonly known as: PROAMATINE Take 1 tablet (5 mg total) by mouth 3 (three) times daily with meals.   multivitamin with minerals Tabs tablet Take 1 tablet by mouth daily. Centrum Silver   NON FORMULARY Pt uses a cpap nightly   nystatin powder Commonly known as: MYCOSTATIN/NYSTOP Apply 1 Application topically 3 (three) times daily.   nystatin cream Commonly known as: MYCOSTATIN Apply 1 Application topically 2 (two) times daily.   onetouch ultrasoft lancets Use to check blood sugar 3 times a day and document results, bring to appointments.  Goal is <130 fasting blood sugar and <180 two hours after meals.   OneTouch Verio w/Device Kit Use to check blood sugar 3 times a day and document results, bring to appointments.  Goal is <130 fasting blood sugar and <180 two hours after meals.   oxymetazoline 0.05 % nasal spray Commonly known as: AFRIN Place 2 sprays into both nostrils at bedtime.   pantoprazole 20 MG tablet Commonly known as: PROTONIX TAKE 1 TABLET (20 MG TOTAL) BY MOUTH 2 (TWO) TIMES DAILY BEFORE A MEAL. What changed: when to take this   potassium chloride SA 20 MEQ tablet Commonly known as: KLOR-CON M Take 2 tablets (40 meq) with Torsemide What changed:  how much to take how to take this when to take this reasons to take this additional instructions   QUEtiapine Fumarate 150 MG 24 hr tablet Commonly known as: SEROQUEL XR TAKE 1 TABLET BY MOUTH EVERYDAY AT BEDTIME   rOPINIRole 0.25 MG tablet Commonly known as: REQUIP Take 0.25 mg by mouth 4 (four) times daily.   rosuvastatin 40 MG tablet Commonly known as: CRESTOR TAKE 1 TABLET BY MOUTH EVERY DAY   solifenacin 10  MG tablet Commonly known as: VESICARE TAKE 1 TABLET BY MOUTH EVERY DAY   tiZANidine 4 MG tablet Commonly known as: Zanaflex Take 1 tablet (4 mg total) by mouth every 6 (six) hours as needed for muscle spasms.   torsemide 20 MG tablet Commonly known as: DEMADEX TAKE 2 TABLETS (40 MG TOTAL) BY MOUTH DAILY AS NEEDED (FLUID RETENTION).   trimethoprim 100 MG tablet Commonly known as: TRIMPEX Take 1 tablet (100 mg total) by mouth daily.   venlafaxine XR 75 MG 24 hr capsule Commonly known as: EFFEXOR-XR TAKE 3 CAPSULES BY MOUTH EVERY DAY   vitamin C 1000 MG tablet Take 1,000 mg by mouth daily.        Disposition:  Home with usual follow up as in AVS   Duration of Discharge Encounter:  APP time: 35 minutes  Signed, Canary Brim, NP-C, AGACNP-BC [facility] - Electrophysiology  03/13/2023, 11:20 AM

## 2023-03-14 ENCOUNTER — Ambulatory Visit: Payer: Self-pay | Admitting: Urology

## 2023-03-14 LAB — BPAM RBC
Blood Product Expiration Date: 202503072359
Blood Product Expiration Date: 202503082359
Blood Product Expiration Date: 202503082359
Blood Product Expiration Date: 202503082359
ISSUE DATE / TIME: 202502171102
ISSUE DATE / TIME: 202502171102
Unit Type and Rh: 600
Unit Type and Rh: 600
Unit Type and Rh: 600
Unit Type and Rh: 600

## 2023-03-14 LAB — TYPE AND SCREEN
ABO/RH(D): A NEG
Antibody Screen: NEGATIVE
Unit division: 0
Unit division: 0
Unit division: 0
Unit division: 0

## 2023-03-18 MED FILL — Fentanyl Citrate Preservative Free (PF) Inj 100 MCG/2ML: INTRAMUSCULAR | Qty: 3 | Status: AC

## 2023-03-19 ENCOUNTER — Telehealth: Payer: Self-pay | Admitting: Cardiology

## 2023-03-19 NOTE — Telephone Encounter (Signed)
 Attempted to contact dentist office to inquire about what all patient is having done. Pt has gen change and new lead placed 03/12/23.

## 2023-03-19 NOTE — Telephone Encounter (Signed)
 Office called patient advised patient was to have routine cleaning. Fax number provided who requested [place] fill out clearance form. Currently waiting on fax.

## 2023-03-19 NOTE — Telephone Encounter (Signed)
 What dental office are you calling from? [facility]    What is your office phone number? [phone]   What is your fax number?[phone]  What type of procedure is the patient having performed? Dental Cleaning   What date is procedure scheduled or is the patient there now? 03/20/23  (if the patient is at the dentist's office question goes to their cardiologist if he/she is in the office.  If not, question should go to the DOD).   What is your question (ex. Antibiotics prior to procedure, holding medication-we need to know how long dentist wants pt to hold med)? Pt told dentist office he just had battery replaced in pacemaker.  He has antibiotics, but they just want to make sure since he just had that done are the any precautions

## 2023-03-20 ENCOUNTER — Encounter: Payer: Medicare Other | Admitting: Cardiology

## 2023-03-27 NOTE — Patient Instructions (Signed)
   After Your Pacemaker   Monitor your pacemaker site for redness, swelling, and drainage. Call the device clinic at [phone] if you experience these symptoms or fever/chills.  Your incision was closed with Steri-strips or staples:  You may shower 7 days after your procedure and wash your incision with soap and water. Avoid lotions, ointments, or perfumes over your incision until it is well-healed.  You may use a hot tub or a pool after your wound check appointment if the incision is completely closed.  Do not lift, push or pull greater than 10 pounds with the affected arm until 6 weeks after your procedure. UNTIL AFTER MARCH 31ST. There are no other restrictions in arm movement after your wound check appointment.  You may drive, unless driving has been restricted by your healthcare providers.  Remote monitoring is used to monitor your pacemaker from home. This monitoring is scheduled every 91 days by our office. It allows [place] to keep an eye on the functioning of your device to ensure it is working properly. You will routinely see your Electrophysiologist annually (more often if necessary).

## 2023-03-28 ENCOUNTER — Telehealth: Payer: Self-pay

## 2023-03-28 ENCOUNTER — Ambulatory Visit: Payer: Medicare Other | Attending: Cardiology

## 2023-03-28 DIAGNOSIS — I442 Atrioventricular block, complete: Secondary | ICD-10-CM

## 2023-03-28 LAB — CUP PACEART INCLINIC DEVICE CHECK
Date Time Interrogation Session: 20250305125640
Implantable Lead Connection Status: 753985
Implantable Lead Connection Status: 753985
Implantable Lead Implant Date: 20180124
Implantable Lead Implant Date: 20250217
Implantable Lead Location: 753859
Implantable Lead Location: 753860
Implantable Lead Model: 3830
Implantable Lead Model: 5076
Implantable Pulse Generator Implant Date: 20250217

## 2023-03-28 NOTE — Telephone Encounter (Signed)
 Patient in today for 10 day post - gen change/new RV lead implant.   Normal device check with slightly blunted HG's, rate response is on.    Patient reports that he has had more dizziness with positional changes and decreased stamina since gen change. He is having difficulty increasing his activity level and feels overall weaker.      Patient would like a referral back to [facility] - Cardiac Rehab. Can we put in referral?    He is going to check orthostatics and routine BP readings at home and report back, also encouraged fluids as wife states he drinks very little.    Rate Response is on and left at implant programmed settings.  Will re-assess support needs as patient works to increase activity.  He has our device clinic number to call with updates.

## 2023-03-28 NOTE — Telephone Encounter (Signed)
 Referral has been placed.

## 2023-03-28 NOTE — Progress Notes (Signed)
 Normal Pacemaker wound check. Wound well healed. Thresholds, sensing, and impedances consistent with implant measurements and at 3.5V safety margin/auto capture until 3 month visit. No episodes. Reviewed arm restrictions to continue for 6 weeks total post op.  Pt enrolled in remote follow-up.  NOTE: patient reports increased dizziness with positional changes since gen change.  In addition, reports decreased stamina and difficulty with increasing his activity level.  HG's are slightly blunted, he does have rate response on.  Plan for patient to check bp's, including orthostatics at home and when dizzy, and report back his readings (may need medication adjustment if bp related).  Will keep rate response on as programmed, patient would like referral back to cardiac rehab - I will let Dr. Lalla Brothers know.  As patient increases activity level with rehab program, we can further assess if rate response adjustments are needed.  He will follow up with provider in 3 months or sooner if symptomatic.

## 2023-03-29 LAB — HM DIABETES EYE EXAM

## 2023-03-29 NOTE — Telephone Encounter (Signed)
 Noted, thanks!

## 2023-03-31 NOTE — Patient Instructions (Signed)

## 2023-04-02 ENCOUNTER — Encounter: Payer: Self-pay | Admitting: Nurse Practitioner

## 2023-04-02 ENCOUNTER — Ambulatory Visit: Payer: Medicare Other | Admitting: Nurse Practitioner

## 2023-04-02 VITALS — BP 136/78 | HR 76 | Temp 97.6°F | Ht 70.8 in | Wt 276.2 lb

## 2023-04-02 DIAGNOSIS — I442 Atrioventricular block, complete: Secondary | ICD-10-CM

## 2023-04-02 DIAGNOSIS — E1169 Type 2 diabetes mellitus with other specified complication: Secondary | ICD-10-CM | POA: Diagnosis not present

## 2023-04-02 DIAGNOSIS — E119 Type 2 diabetes mellitus without complications: Secondary | ICD-10-CM | POA: Diagnosis not present

## 2023-04-02 DIAGNOSIS — E1159 Type 2 diabetes mellitus with other circulatory complications: Secondary | ICD-10-CM | POA: Diagnosis not present

## 2023-04-02 DIAGNOSIS — G894 Chronic pain syndrome: Secondary | ICD-10-CM

## 2023-04-02 DIAGNOSIS — I7143 Infrarenal abdominal aortic aneurysm, without rupture: Secondary | ICD-10-CM

## 2023-04-02 DIAGNOSIS — F419 Anxiety disorder, unspecified: Secondary | ICD-10-CM

## 2023-04-02 DIAGNOSIS — F339 Major depressive disorder, recurrent, unspecified: Secondary | ICD-10-CM

## 2023-04-02 DIAGNOSIS — I4819 Other persistent atrial fibrillation: Secondary | ICD-10-CM

## 2023-04-02 DIAGNOSIS — I2724 Chronic thromboembolic pulmonary hypertension: Secondary | ICD-10-CM

## 2023-04-02 DIAGNOSIS — Z7984 Long term (current) use of oral hypoglycemic drugs: Secondary | ICD-10-CM

## 2023-04-02 DIAGNOSIS — N1831 Chronic kidney disease, stage 3a: Secondary | ICD-10-CM

## 2023-04-02 MED ORDER — LORAZEPAM 1 MG PO TABS: ORAL_TABLET | ORAL | 0 refills | Status: DC

## 2023-04-02 MED ORDER — ROSUVASTATIN CALCIUM 40 MG PO TABS: 40.0000 mg | ORAL_TABLET | Freq: Every day | ORAL | 3 refills | Status: AC

## 2023-04-02 MED ORDER — VENLAFAXINE HCL ER 75 MG PO CP24: 225.0000 mg | ORAL_CAPSULE | Freq: Every day | ORAL | 3 refills | Status: AC

## 2023-04-02 MED ORDER — QUETIAPINE FUMARATE ER 150 MG PO TB24: 150.0000 mg | ORAL_TABLET | Freq: Every day | ORAL | 4 refills | Status: AC

## 2023-04-02 NOTE — Assessment & Plan Note (Signed)
 Chronic, ongoing.  Continue current medication regimen and adjust as needed. Lipid panel today.

## 2023-04-02 NOTE — Assessment & Plan Note (Addendum)
 Chronic, stable.  BP at goal.  Followed by cardiology.  With orthostatic BP presenting occasionally.  Continue current regimen at this time and have recommended utilizing compression hose at home, agrees to try this.  Continue collaboration with cardiology + neurology and current medication regimen.  Labs: TSH, CMP.  Recommend ensuring good fluid intake at home.

## 2023-04-02 NOTE — Assessment & Plan Note (Signed)
Ongoing and stable.  Followed by cardiology.  Pacemaker checks by them.

## 2023-04-02 NOTE — Assessment & Plan Note (Signed)
 Chronic, ongoing.  Discussed at length risk of benzo and opioid use in conjunction with each other.  Recommend he not take the two together at same hour during day or evening. Recommend now that has CPAP and improved sleep trial cut back slowly on Ativan.  They request 90 day supply, as previous PCP supplied this.  Are aware he does have to return every 3 months for refills.  UDS up to date with pain management.

## 2023-04-02 NOTE — Assessment & Plan Note (Signed)
Chronic, ongoing followed by pain clinic, Dr. Andree Elk.  Discussed at length risk of benzo and opioid use in conjunction with each other.  Recommend he not take the two together at same hour during day or evening.

## 2023-04-02 NOTE — Progress Notes (Signed)
 BP 136/78 (BP Location: Left Arm, Cuff Size: Normal)   Pulse 76   Temp 97.6 F (36.4 C) (Oral)   Ht 5' 10.8" (1.798 m)   Wt 276 lb 3.2 oz (125.3 kg)   SpO2 98%   BMI 38.74 kg/m    Subjective:    Patient ID: Nicholas [name], male    DOB: 05/04/1945, 78 y.o.   MRN: [id number]  HPI: Nicholas [name] is a 78 y.o. male  Chief Complaint  Patient presents with   Hyperlipidemia   Hypertension   Diabetes   Depression   DIABETES A1c 6.3% in December. Tries to eat healthy, smaller portions.  Not taking Jardiance, has not taken for some time due to urology concern with frequent UTI.  Has not taken in 2-3 months.  Ozempic caused GI symptoms, including increased heart burn.   Hypoglycemic episodes:no Polydipsia/polyuria: no Visual disturbance: no Chest pain: no Paresthesias: no Glucose Monitoring: yes  Accucheck frequency: not checking  Fasting glucose:   Post prandial:  Evening:  Before meals: Taking Insulin?: no  Long acting insulin:  Short acting insulin: Blood Pressure Monitoring: daily Retinal Examination: Up To Date -- [place], macular degeneration noted Foot Exam: Up to Date Pneumovax: Up to Date Influenza: Up to Date Aspirin: no   HYPERTENSION / HYPERLIPIDEMIA/A-FIB Saw cardiology last on 02/28/23, had lead extraction on 03/12/23.  Takes Rosuvastatin, Eliquis, fish oil.  Last ablation was 05/17/20. Echo 04/06/22 EF 60-65% and mild LVH.  Pacemaker placed January 2018. Uses CPAP 85% of the time.  Continues on Midodrine for hypotensive episodes.  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  Saw 12/18/22 last.  He reports continuing to have SOB at baseline. Satisfied with current treatment? yes Duration of hypertension: chronic BP monitoring frequency: occasional BP range:  BP medication side effects: no Duration of hyperlipidemia: chronic Cholesterol medication side effects: no Cholesterol supplements: fish oil Medication compliance: good  compliance Aspirin: no Recent stressors: no Recurrent headaches: no Visual changes: no Palpitations: no Dyspnea: if exerting self a lot, at baseline Chest pain: no Lower extremity edema: no Dizzy/lightheaded: improving  CHRONIC KIDNEY DISEASE CKD status: stable Medications renally dose: yes Previous renal evaluation: no Pneumovax:  Up to Date Influenza Vaccine:  Up to Date    Latest Ref Rng & Units 02/28/2023    9:16 AM 01/01/2023    9:03 AM 10/02/2022    9:10 AM  BMP  Glucose 70 - 99 mg/dL 355  732  202   BUN 8 - 27 mg/dL 24  18  17    Creatinine 0.76 - 1.27 mg/dL 5.42  7.06  2.37   BUN/Creat Ratio 10 - 24 18  13  15    Sodium 134 - 144 mmol/L 141  140  140   Potassium 3.5 - 5.2 mmol/L 5.0  4.7  4.1   Chloride 96 - 106 mmol/L 100  101  101   CO2 20 - 29 mmol/L 23  27  24    Calcium 8.6 - 10.2 mg/dL 9.8  9.2  9.3     DEPRESSION & CHRONIC PAIN Continues Effexor, Seroquel, and Ativan.  Pt and his wife at bedside made aware of risks of benzo medication use to include increased sedation, respiratory suppression, falls, extrapyramidal movements, dependence and cardiovascular events.  Pt and his wife would like to continue treatment as benefit determined to outweigh risk.  Multiple at length discussions with him that he is also on opioid therapy.  Discussed  risks with taking these three medications together at same time and recommend to separate them when taking Seroquel, Ativan, and opioid.  Veteran, has been on this regimen for years.   Tried in past taking 1/2 tablet Ativan, but does not work well (per wife and patient) and wishes to maintain current dosing.  Ativan fill on PDMP review 01/17/23 and last opioid fill 03/24/23.  UDS with pain management on 12/14/22. Dr. Pernell Dupre with pain management last 02/21/23. Duration: stable Anxious mood: no Excessive worrying: occasional Irritability: no Sweating: no Nausea: no Palpitations:no Hyperventilation: no Panic attacks: no Agoraphobia: no   Obscessions/compulsions: no Depressed mood: no    02/01/2023    9:50 AM 12/12/2022    1:00 PM 11/30/2022    9:39 AM 11/09/2022    9:31 AM 10/23/2022    9:11 AM  Depression screen PHQ 2/9  Decreased Interest 0 0 0 0 1  Down, Depressed, Hopeless 0 0 0 1 1  PHQ - 2 Score 0 0 0 1 2  Altered sleeping 1  0 0 1  Tired, decreased energy 1  1 2 3   Change in appetite 0  0 1 1  Feeling bad or failure about yourself  0  0 1 1  Trouble concentrating 0  0 0 0  Moving slowly or fidgety/restless 0  0 0 1  Suicidal thoughts 0  0 0 0  PHQ-9 Score 2  1 5 9   Difficult doing work/chores Somewhat difficult  Somewhat difficult Not difficult at all Very difficult       10/23/2022    9:11 AM 10/02/2022    9:09 AM 11/29/2021    8:53 AM 08/24/2021    8:38 AM  GAD 7 : Generalized Anxiety Score  Nervous, Anxious, on Edge 1 0 1 3  Control/stop worrying 2 3 2 3   Worry too much - different things 2 3 1 3   Trouble relaxing 1 0 1 3  Restless 0 0 1 3  Easily annoyed or irritable 1 0 1 3  Afraid - awful might happen 2 2 1 3   Total GAD 7 Score 9 8 8 21   Anxiety Difficulty Somewhat difficult Somewhat difficult Not difficult at all Extremely difficult   AIMS:  Facial and Oral Movements  Muscles of Facial Expression: None Lips and Perioral Area: None Jaw: None Tongue: None Extremity Movements: Upper (arms, wrists, hands, fingers): None Lower (legs, knees, ankles, toes): None Trunk Movements:  Neck, shoulders, hips: None Overall Severity : Severity of abnormal movements: None Incapacitation due to abnormal movements: None Patient's awareness of abnormal movements (rate only patient's report): none present Dental Status  Current problems with teeth and/or dentures?: No  Does patient usually wear dentures?: No   Relevant past medical, surgical, family and social history reviewed and updated as indicated. Interim medical history since our last visit reviewed. Allergies and medications reviewed and  updated.  Review of Systems  Constitutional:  Negative for activity change, appetite change, fatigue and fever.  Respiratory:  Positive for shortness of breath (occasional). Negative for cough, chest tightness and wheezing.   Cardiovascular:  Negative for chest pain, palpitations and leg swelling.  Endocrine: Negative for polydipsia, polyphagia and polyuria.  Musculoskeletal:  Positive for arthralgias.  Neurological: Negative.   Psychiatric/Behavioral:  Negative for decreased concentration, self-injury, sleep disturbance and suicidal ideas. The patient is not nervous/anxious.    Per HPI unless specifically indicated above     Objective:    BP 136/78 (BP Location: Left Arm, Cuff  Size: Normal)   Pulse 76   Temp 97.6 F (36.4 C) (Oral)   Ht 5' 10.8" (1.798 m)   Wt 276 lb 3.2 oz (125.3 kg)   SpO2 98%   BMI 38.74 kg/m   Wt Readings from Last 3 Encounters:  04/02/23 276 lb 3.2 oz (125.3 kg)  03/13/23 273 lb 3.2 oz (123.9 kg)  02/28/23 273 lb 6.4 oz (124 kg)    Physical Exam Vitals and nursing note reviewed.  Constitutional:      General: He is awake. He is not in acute distress.    Appearance: Normal appearance. He is well-developed and well-groomed. He is obese. He is not ill-appearing or toxic-appearing.  HENT:     Head: Normocephalic.     Right Ear: Hearing and external ear normal.     Left Ear: Hearing and external ear normal.  Eyes:     General: Lids are normal.     Extraocular Movements: Extraocular movements intact.     Conjunctiva/sclera: Conjunctivae normal.  Neck:     Thyroid: No thyromegaly.     Vascular: No carotid bruit.  Cardiovascular:     Rate and Rhythm: Normal rate and regular rhythm.     Heart sounds: Normal heart sounds. No murmur heard.    No gallop.  Pulmonary:     Effort: No accessory muscle usage or respiratory distress.     Breath sounds: Normal breath sounds.  Abdominal:     General: Bowel sounds are normal. There is no distension.      Palpations: Abdomen is soft.     Tenderness: There is no abdominal tenderness.  Musculoskeletal:     Cervical back: Full passive range of motion without pain.     Right lower leg: No edema.     Left lower leg: No edema.  Lymphadenopathy:     Cervical: No cervical adenopathy.  Skin:    General: Skin is warm.     Capillary Refill: Capillary refill takes less than 2 seconds.  Neurological:     Mental Status: He is alert and oriented to person, place, and time.     Deep Tendon Reflexes: Reflexes are normal and symmetric.     Reflex Scores:      Brachioradialis reflexes are 2+ on the right side and 2+ on the left side.      Patellar reflexes are 2+ on the right side and 2+ on the left side. Psychiatric:        Attention and Perception: Attention normal.        Mood and Affect: Mood normal.        Speech: Speech normal.        Behavior: Behavior normal. Behavior is cooperative.        Thought Content: Thought content normal.    Results for orders placed or performed in visit on 03/28/23  CUP PACEART [facility] DEVICE CHECK   Collection Time: 03/28/23 12:56 PM  Result Value Ref Range   Pulse Generator Manufacturer MERM    Date Time Interrogation Session [id number]    Pulse Gen Model [id number] Azure XT DR MRI    Pulse Gen Serial Number [id number]    Clinic Name [facility]    Implantable Pulse Generator Type Implantable Pulse Generator    Implantable Pulse Generator Implant Date [id number]    Implantable Lead Manufacturer [facility]    Implantable Lead Model 3830 SelectSecure MRI SureScan    Implantable Lead Serial Number [id number]    Implantable Lead Implant Date  32440102    Implantable Lead Location Detail 1 UNKNOWN    Implantable Lead Location [id number]    Implantable Lead Connection Status 725366    Implantable Lead Manufacturer MERM    Implantable Lead Model 5076 CapSureFix Novus MRI SureScan    Implantable Lead Serial Number [id number]    Implantable Lead Implant Date [id number]     Implantable Lead Location Detail 1 APPENDAGE    Implantable Lead Location [id number]    Implantable Lead Connection Status [id number]    Eval Rhythm AS 69/ VS 38       Assessment & Plan:   Problem List Items Addressed This Visit       Cardiovascular and Mediastinum   Abdominal aortic aneurysm (AAA) (HCC) (Chronic)   Ongoing.  Noted on imaging on 11/22/19.  With recommendation to monitor annually.  Will continue statin and monitor BP control.  Last CT abdomen in March 2023 with no aneurysm reported, similar in March 2024.      Relevant Medications   rosuvastatin (CRESTOR) 40 MG tablet   Complete heart block (HCC)   Ongoing and stable.  Followed by cardiology.  Pacemaker checks by them.       Relevant Medications   rosuvastatin (CRESTOR) 40 MG tablet   CTEPH (chronic thromboembolic pulmonary hypertension) (HCC)   Diagnosed at [facility] August 2024.  Will continue collaboration with pulmonary, recent notes and tests reviewed.  Recommend they go to website for [facility] that was recommended by pulmonary.      Relevant Medications   rosuvastatin (CRESTOR) 40 MG tablet   Hypertension associated with diabetes (HCC)   Chronic, stable.  BP at goal.  Followed by cardiology.  With orthostatic BP presenting occasionally.  Continue current regimen at this time and have recommended utilizing compression hose at home, agrees to try this.  Continue collaboration with cardiology + neurology and current medication regimen.  Labs: TSH, CMP.  Recommend ensuring good fluid intake at home.        Relevant Medications   rosuvastatin (CRESTOR) 40 MG tablet   Other Relevant Orders   Comprehensive metabolic panel   TSH   Persistent atrial fibrillation (HCC)   Chronic, ongoing.  Followed by cardiology.  Will continue this collaboration and medication as ordered by them.      Relevant Medications   rosuvastatin (CRESTOR) 40 MG tablet     Endocrine   Diabetes mellitus treated with oral medication (HCC)   Refer to  diabetes with obesity plan of care.      Relevant Medications   rosuvastatin (CRESTOR) 40 MG tablet   Hyperlipidemia associated with type 2 diabetes mellitus (HCC)   Chronic, ongoing.  Continue current medication regimen and adjust as needed. Lipid panel today.      Relevant Medications   rosuvastatin (CRESTOR) 40 MG tablet   Other Relevant Orders   Comprehensive metabolic panel   Lipid Panel w/o Chol/HDL Ratio   Type 2 diabetes mellitus with obesity (HCC) - Primary   Diagnosed on 02/21/21, currently no medications.  Jardiance caused frequent UTI.  A1c 6.3% last visit, trend down, recheck today.  Glucometer supplies present, recommend he check sugars at least daily fasting.  Labs today.  Recommend cutting back on snacking during day and minimize sweets.  Return in 3 months. - Statin on board.  No ACE or ARB due to hypotension episodes. - Vaccinations up to date. - Eye and foot exams up to date.      Relevant Medications   rosuvastatin (CRESTOR) 40  MG tablet   Other Relevant Orders   HgB A1c   Urine Microalbumin w/creat. ratio     Genitourinary   Stage 3a chronic kidney disease (HCC) (Chronic)   Stable.  His recent labs since July 2022 have been within good ranges and improved.  If ongoing stability will discontinue this diagnosis.      Relevant Orders   Magnesium     Other   Anxiety (Chronic)   Chronic, ongoing.  Discussed at length risk of benzo and opioid use in conjunction with each other.  Recommend he not take the two together at same hour during day or evening. Recommend now that has CPAP and improved sleep trial cut back slowly on Ativan.  They request 90 day supply, as previous PCP supplied this.  Are aware he does have to return every 3 months for refills.  UDS up to date with pain management.        Relevant Medications   LORazepam (ATIVAN) 1 MG tablet   QUEtiapine Fumarate (SEROQUEL XR) 150 MG 24 hr tablet   venlafaxine XR (EFFEXOR-XR) 75 MG 24 hr capsule    Chronic pain syndrome (Chronic)   Chronic, ongoing followed by pain clinic, Dr. Pernell Dupre.  Discussed at length risk of benzo and opioid use in conjunction with each other.  Recommend he not take the two together at same hour during day or evening.        Relevant Medications   venlafaxine XR (EFFEXOR-XR) 75 MG 24 hr capsule   Depression, recurrent (HCC) (Chronic)   Chronic, ongoing.  Denies SI/HI.  Continue current medication regimen and adjust as needed.  Would benefit from trial off Effexor and trial of SSRI, but refuses this.        Relevant Medications   LORazepam (ATIVAN) 1 MG tablet   QUEtiapine Fumarate (SEROQUEL XR) 150 MG 24 hr tablet   venlafaxine XR (EFFEXOR-XR) 75 MG 24 hr capsule   Morbid obesity (HCC) (Chronic)   BMI 38.74 with HTN, A-Fib, Heart Block, and CKD.  Recommended eating smaller high protein, low fat meals more frequently and exercising 30 mins a day 5 times a week with a goal of 10-15lb weight loss in the next 3 months. Patient voiced their understanding and motivation to adhere to these recommendations.         Follow up plan: Return in about 3 months (around 07/03/2023) for T2DM, HTN/HLD, Depression, ANXIETY.

## 2023-04-02 NOTE — Assessment & Plan Note (Signed)
 Diagnosed on 02/21/21, currently no medications.  Jardiance caused frequent UTI.  A1c 6.3% last visit, trend down, recheck today.  Glucometer supplies present, recommend he check sugars at least daily fasting.  Labs today.  Recommend cutting back on snacking during day and minimize sweets.  Return in 3 months. - Statin on board.  No ACE or ARB due to hypotension episodes. - Vaccinations up to date. - Eye and foot exams up to date.

## 2023-04-02 NOTE — Assessment & Plan Note (Signed)
 Chronic, ongoing.  Followed by cardiology.  Will continue this collaboration and medication as ordered by them.

## 2023-04-02 NOTE — Assessment & Plan Note (Signed)
 BMI 38.74 with HTN, A-Fib, Heart Block, and CKD.  Recommended eating smaller high protein, low fat meals more frequently and exercising 30 mins a day 5 times a week with a goal of 10-15lb weight loss in the next 3 months. Patient voiced their understanding and motivation to adhere to these recommendations.

## 2023-04-02 NOTE — Assessment & Plan Note (Signed)
Chronic, ongoing.  Denies SI/HI.  Continue current medication regimen and adjust as needed.  Would benefit from trial off Effexor and trial of SSRI, but refuses this.   

## 2023-04-02 NOTE — Assessment & Plan Note (Signed)
 Stable.  His recent labs since July 2022 have been within good ranges and improved.  If ongoing stability will discontinue this diagnosis.

## 2023-04-02 NOTE — Assessment & Plan Note (Signed)
 Ongoing.  Noted on imaging on 11/22/19.  With recommendation to monitor annually.  Will continue statin and monitor BP control.  Last CT abdomen in March 2023 with no aneurysm reported, similar in March 2024.

## 2023-04-02 NOTE — Assessment & Plan Note (Signed)
 Refer to diabetes with obesity plan of care.

## 2023-04-02 NOTE — Assessment & Plan Note (Signed)
Diagnosed at [facility] August 2024.  Will continue collaboration with pulmonary, recent notes and tests reviewed.  Recommend they go to website for [facility] that was recommended by pulmonary.

## 2023-04-03 ENCOUNTER — Encounter: Payer: Self-pay | Admitting: Nurse Practitioner

## 2023-04-03 LAB — COMPREHENSIVE METABOLIC PANEL
ALT: 27 IU/L (ref 0–44)
AST: 28 IU/L (ref 0–40)
Albumin: 4.4 g/dL (ref 3.8–4.8)
Alkaline Phosphatase: 60 IU/L (ref 44–121)
BUN/Creatinine Ratio: 21 (ref 10–24)
BUN: 23 mg/dL (ref 8–27)
Bilirubin Total: 0.7 mg/dL (ref 0.0–1.2)
CO2: 25 mmol/L (ref 20–29)
Calcium: 9.7 mg/dL (ref 8.6–10.2)
Chloride: 101 mmol/L (ref 96–106)
Creatinine, Ser: 1.12 mg/dL (ref 0.76–1.27)
Globulin, Total: 2.1 g/dL (ref 1.5–4.5)
Glucose: 163 mg/dL — ABNORMAL HIGH (ref 70–99)
Potassium: 5 mmol/L (ref 3.5–5.2)
Sodium: 138 mmol/L (ref 134–144)
Total Protein: 6.5 g/dL (ref 6.0–8.5)
eGFR: 68 mL/min/{1.73_m2} (ref >59)

## 2023-04-03 LAB — MICROALBUMIN / CREATININE URINE RATIO
Creatinine, Urine: 131.7 mg/dL
Microalb/Creat Ratio: 27 mg/g{creat} (ref 0–29)
Microalbumin, Urine: 35.5 ug/mL

## 2023-04-03 LAB — LIPID PANEL W/O CHOL/HDL RATIO
Cholesterol, Total: 125 mg/dL (ref 100–199)
HDL: 51 mg/dL (ref >39)
LDL Chol Calc (NIH): 55 mg/dL (ref 0–99)
Triglycerides: 100 mg/dL (ref 0–149)
VLDL Cholesterol Cal: 19 mg/dL (ref 5–40)

## 2023-04-03 LAB — HEMOGLOBIN A1C
Est. average glucose Bld gHb Est-mCnc: 146 mg/dL
Hgb A1c MFr Bld: 6.7 % — ABNORMAL HIGH (ref 4.8–5.6)

## 2023-04-03 LAB — MAGNESIUM: Magnesium: 2.3 mg/dL (ref 1.6–2.3)

## 2023-04-03 LAB — TSH: TSH: 1.25 u[IU]/mL (ref 0.450–4.500)

## 2023-04-03 NOTE — Telephone Encounter (Signed)
 Received outreach from Cardiac Rehab team:   Patient does not have a new cardiac  event that would qualify him for insurance to cover the program.  They suggested he look in to the [place] [facility] for gym enrollment and it's possible insurance could cover his membership through [employer].   I spoke with patient.  He actually has a [employer] membership and is not interested in [place].  He just really liked the cardiac rehab program and was hoping he would be able to pick it back up.   Plans to resume [employer] once he has graduated from his 6 week device/arm restrictions.  Currently is doing some in and around the house lap walking to begin rebuilding of his strength.  He thanks [place] for our attempts.

## 2023-04-03 NOTE — Progress Notes (Signed)
 Contacted via MyChart   Good morning Tommy, your labs have returned: - Kidney function, creatinine and eGFR, remains normal, as is liver function, AST and ALT.  - A1c is 6.7%, slight trend up from 6.3% without Jardiance on board.  For now focus on diet and regular activity.  Lowering carbs and sugar rich foods.  We will recheck in 3 months and if needed try a different medication. - Remainder of labs are stable.  Any questions? Keep being stellar!!  Thank you for allowing me to participate in your care.  I appreciate you. Kindest regards, Rylon Poitra

## 2023-04-04 ENCOUNTER — Ambulatory Visit: Payer: Self-pay | Admitting: *Deleted

## 2023-04-04 NOTE — Patient Instructions (Signed)
 Visit Information  Thank you for taking time to visit with me today. Please don't hesitate to contact me if I can be of assistance to you before our next scheduled telephone appointment.  Following are the goals we discussed today:  Reminder to check blood sugars Continue activity restrictions for total of 6weeks   Our next appointment is by telephone on Wednesday, April 16 at 2:00pm  Please call the care guide team at [phone] if you need to cancel or reschedule your appointment.   Please call the Suicide and Crisis Lifeline: 988 if you are experiencing a Mental Health or Behavioral Health Crisis or need someone to talk to.  Patient verbalizes understanding of instructions and care plan provided today and agrees to view in MyChart. Active MyChart status and patient understanding of how to access instructions and care plan via MyChart confirmed with patient.     The patient has been provided with contact information for the care management team and has been advised to call with any health related questions or concerns.   Rodney Langton, RN, MSN, CCM [facility], [facility] RN Care Coordinator Direct Dial: [phone] / Main [phone] Fax [phone] Email: [email or web address]: [email or web address]

## 2023-04-04 NOTE — Patient Outreach (Signed)
 Care Coordination   Follow Up Visit Note   04/04/2023 Name: Nicholas [name] MRN: [id number] DOB: [birth year]  [name] is a 78 y.o. year old male who sees Cannady, Corrie Dandy T, NP for primary care. I spoke with  [name] by phone today.  What matters to the patients health and wellness today?  Patient is doing well, in good spirits today after Pacemaker battery/lead change.  He is eager to start back on exercise program after his 6 week activity restriction is over.     Goals Addressed             This Visit's Progress    Effective Management of Health Conditions       Interventions Today    Flowsheet Row Most Recent Value  Chronic Disease   Chronic disease during today's visit Diabetes, Atrial Fibrillation (AFib), Hypertension (HTN)  General Interventions   General Interventions Discussed/Reviewed General Interventions Discussed, General Interventions Reviewed, Durable Medical Equipment (DME), Doctor Visits, Labs  Labs Hgb A1c every 3 months  [A1C 6.7 (04/02/23)]  Doctor Visits Discussed/Reviewed Doctor Visits Discussed, Doctor Visits Reviewed, PCP, Specialist  [3/25 Pain, 3/26 Urology, 3/27 Cardio,]  Durable Medical Equipment (DME) BP Cuff  [Today's BP 139/67, HR 71, O2SAT 94%]  PCP/Specialist Visits Compliance with follow-up visit  Exercise Interventions   Exercise Discussed/Reviewed Exercise Discussed, Exercise Reviewed, Physical Activity  Physical Activity Discussed/Reviewed Physical Activity Discussed, Gym  [He is a member at [employer], will return after 6 week restriction is over.]  Education Interventions   Education Provided Provided Education  Provided Verbal Education On Nutrition, Other, Medication, Blood Sugar Monitoring  [Pacemaker battery/lead change, discussed signs/symptoms of infection at insertion site, denies fever or pain today.  Reminded to monitor blood sugars.]  Nutrition Interventions   Nutrition Discussed/Reviewed Nutrition Discussed, Nutrition  Reviewed, Decreasing sugar intake, Portion sizes, Adding fruits and vegetables, Increasing proteins, Decreasing fats  Pharmacy Interventions   Pharmacy Dicussed/Reviewed Pharmacy Topics Discussed, Affording Medications  [Cardio is providing paper script so he can get med from [place] for $230/90 day.]              SDOH assessments and interventions completed:  No     Care Coordination Interventions:  Yes, provided   Follow up plan: Follow up call scheduled for April 16 at 2:00pm with [facility], patient aware.   Encounter Outcome:  Patient Visit Completed   Rodney Langton, RN, MSN, CCM [place]  [facility], [facility] RN Care Coordinator Direct Dial: [phone] / Main [phone] Fax [phone] Email: [email or web address]

## 2023-04-09 ENCOUNTER — Other Ambulatory Visit: Payer: Self-pay

## 2023-04-09 DIAGNOSIS — R972 Elevated prostate specific antigen [PSA]: Secondary | ICD-10-CM

## 2023-04-09 DIAGNOSIS — N400 Enlarged prostate without lower urinary tract symptoms: Secondary | ICD-10-CM

## 2023-04-16 ENCOUNTER — Other Ambulatory Visit: Payer: Medicare Other

## 2023-04-16 DIAGNOSIS — R972 Elevated prostate specific antigen [PSA]: Secondary | ICD-10-CM

## 2023-04-16 DIAGNOSIS — N400 Enlarged prostate without lower urinary tract symptoms: Secondary | ICD-10-CM

## 2023-04-17 ENCOUNTER — Ambulatory Visit: Payer: Medicare Other | Attending: Anesthesiology | Admitting: Anesthesiology

## 2023-04-17 ENCOUNTER — Encounter: Payer: Self-pay | Admitting: Anesthesiology

## 2023-04-17 ENCOUNTER — Telehealth: Payer: Self-pay | Admitting: Anesthesiology

## 2023-04-17 DIAGNOSIS — M5416 Radiculopathy, lumbar region: Secondary | ICD-10-CM

## 2023-04-17 DIAGNOSIS — M5431 Sciatica, right side: Secondary | ICD-10-CM

## 2023-04-17 DIAGNOSIS — M5432 Sciatica, left side: Secondary | ICD-10-CM | POA: Diagnosis not present

## 2023-04-17 DIAGNOSIS — R1031 Right lower quadrant pain: Secondary | ICD-10-CM

## 2023-04-17 DIAGNOSIS — G894 Chronic pain syndrome: Secondary | ICD-10-CM | POA: Diagnosis not present

## 2023-04-17 DIAGNOSIS — M62830 Muscle spasm of back: Secondary | ICD-10-CM

## 2023-04-17 DIAGNOSIS — M79642 Pain in left hand: Secondary | ICD-10-CM

## 2023-04-17 DIAGNOSIS — G8929 Other chronic pain: Secondary | ICD-10-CM

## 2023-04-17 DIAGNOSIS — M1611 Unilateral primary osteoarthritis, right hip: Secondary | ICD-10-CM

## 2023-04-17 DIAGNOSIS — M4726 Other spondylosis with radiculopathy, lumbar region: Secondary | ICD-10-CM

## 2023-04-17 DIAGNOSIS — Z79891 Long term (current) use of opiate analgesic: Secondary | ICD-10-CM

## 2023-04-17 DIAGNOSIS — F1190 Opioid use, unspecified, uncomplicated: Secondary | ICD-10-CM

## 2023-04-17 DIAGNOSIS — M79641 Pain in right hand: Secondary | ICD-10-CM

## 2023-04-17 DIAGNOSIS — M47817 Spondylosis without myelopathy or radiculopathy, lumbosacral region: Secondary | ICD-10-CM

## 2023-04-17 DIAGNOSIS — M47816 Spondylosis without myelopathy or radiculopathy, lumbar region: Secondary | ICD-10-CM

## 2023-04-17 LAB — PSA: Prostate Specific Ag, Serum: 6 ng/mL — ABNORMAL HIGH (ref 0.0–4.0)

## 2023-04-17 MED ORDER — HYDROCODONE-ACETAMINOPHEN 5-325 MG PO TABS: 1.0000 | ORAL_TABLET | Freq: Two times a day (BID) | ORAL | 0 refills | Status: AC

## 2023-04-17 NOTE — Patient Instructions (Signed)

## 2023-04-17 NOTE — Telephone Encounter (Signed)
 Patient will like for Adams to give him a call today, for his vv, mm appt at [phone]. TY

## 2023-04-17 NOTE — Progress Notes (Signed)
 Virtual Visit via Telephone Note  I connected with Nicholas [name] on 04/17/23 at  4:00 PM EDT by telephone and verified that I am speaking with the correct person using two identifiers.  Location: Patient: Home Provider: Pain control center   I discussed the limitations, risks, security and privacy concerns of performing an evaluation and management service by telephone and the availability of in person appointments. I also discussed with the patient that there may be a patient responsible charge related to this service. The patient expressed understanding and agreed to proceed.   History of Present Illness: I spoke with Nicholas [name] via telephone as we were unable link for the video portion of the conference.  He reports that his low back pain has been doing reasonably well lately.  He has been less active secondary to some issues with his pacemaker but his heart function is doing well and will likely enable him to be more active, late March.  In regards to his low back pain.  The quality characteristic and distribution of this is otherwise unchanged.  He has mainly some chronic aching gnawing pain in the low back radiating into the hips buttocks which is failed more conservative therapy.  He takes his hydrocodone twice a day once in the morning once in the afternoon and generally gets about 80 to 90% relief lasting about 6 or 8 hours.  This enables him to stay functional active and do well.  Otherwise he is in his usual state of health with no new side effects reported.  Review of systems: General: No fevers or chills Pulmonary: No shortness of breath or dyspnea Cardiac: No angina or palpitations or lightheadedness GI: No abdominal pain or constipation Psych: No depression    Observations/Objective:  Current Outpatient Medications:    [START ON 05/23/2023] HYDROcodone-acetaminophen (NORCO/VICODIN) 5-325 MG tablet, Take 1 tablet by mouth 2 (two) times daily., Disp: 60 tablet, Rfl: 0    acetaminophen (TYLENOL) 650 MG CR tablet, Take 650 mg by mouth every 8 (eight) hours as needed for pain., Disp: , Rfl:    albuterol (VENTOLIN HFA) 108 (90 Base) MCG/ACT inhaler, INHALE 2 PUFFS BY MOUTH EVERY 6 HOURS AS NEEDED, Disp: 6.7 each, Rfl: 2   apixaban (ELIQUIS) 5 MG TABS tablet, Take 1 tablet (5 mg total) by mouth 2 (two) times daily., Disp: 180 tablet, Rfl: 1   Ascorbic Acid (VITAMIN C) 1000 MG tablet, Take 1,000 mg by mouth daily., Disp: , Rfl:    Blood Glucose Monitoring Suppl (ONETOUCH VERIO) w/Device KIT, Use to check blood sugar 3 times a day and document results, bring to appointments.  Goal is <130 fasting blood sugar and <180 two hours after meals., Disp: 1 kit, Rfl: 0   calcium-vitamin D (OSCAL WITH D) 500-5 MG-MCG tablet, Take 1 tablet by mouth daily with breakfast., Disp: , Rfl:    Cranberry 500 MG CAPS, Take 500 mg by mouth daily., Disp: , Rfl:    gabapentin (NEURONTIN) 600 MG tablet, TAKE 1 TABLET BY MOUTH EVERYDAY AT BEDTIME, Disp: 90 tablet, Rfl: 4   glucose blood test strip, Use to check blood sugar 3 times a day and document results, bring to appointments.  Goal is <130 fasting blood sugar and <180 two hours after meals., Disp: 100 each, Rfl: 12   [START ON 04/23/2023] HYDROcodone-acetaminophen (NORCO/VICODIN) 5-325 MG tablet, Take 1 tablet by mouth 2 (two) times daily., Disp: 60 tablet, Rfl: 0   Lancets (ONETOUCH ULTRASOFT) lancets, Use to check blood sugar  3 times a day and document results, bring to appointments.  Goal is <130 fasting blood sugar and <180 two hours after meals., Disp: 100 each, Rfl: 12   LORazepam (ATIVAN) 1 MG tablet, TAKE 1 TABLET BY MOUTH EVERYDAY AT BEDTIME, Disp: 90 tablet, Rfl: 0   Magnesium 400 MG TABS, Take 400 mg by mouth daily. , Disp: , Rfl:    MELATONIN PO, Take 10 mg by mouth at bedtime., Disp: , Rfl:    midodrine (PROAMATINE) 5 MG tablet, Take 1 tablet (5 mg total) by mouth 3 (three) times daily with meals., Disp: 270 tablet, Rfl: 3    Multiple Vitamin (MULTIVITAMIN WITH MINERALS) TABS tablet, Take 1 tablet by mouth daily. Centrum Silver, Disp: , Rfl:    NON FORMULARY, Pt uses a cpap nightly, Disp: , Rfl:    nystatin cream (MYCOSTATIN), Apply 1 Application topically 2 (two) times daily., Disp: 30 g, Rfl: 4   Omega-3 Fatty Acids (FISH OIL) 1200 MG CAPS, Take 1,200 mg by mouth daily., Disp: , Rfl:    oxymetazoline (AFRIN) 0.05 % nasal spray, Place 2 sprays into both nostrils at bedtime., Disp: , Rfl:    pantoprazole (PROTONIX) 20 MG tablet, TAKE 1 TABLET (20 MG TOTAL) BY MOUTH 2 (TWO) TIMES DAILY BEFORE A MEAL. (Patient taking differently: Take 20 mg by mouth daily.), Disp: 180 tablet, Rfl: 2   potassium chloride SA (KLOR-CON M) 20 MEQ tablet, Take 2 tablets (40 meq) with Torsemide (Patient taking differently: Take 40 mEq by mouth daily as needed (when taking torsemide).), Disp: 30 tablet, Rfl: 3   QUEtiapine Fumarate (SEROQUEL XR) 150 MG 24 hr tablet, Take 1 tablet (150 mg total) by mouth at bedtime., Disp: 90 tablet, Rfl: 4   rOPINIRole (REQUIP) 0.25 MG tablet, Take 0.25 mg by mouth 4 (four) times daily., Disp: , Rfl:    rosuvastatin (CRESTOR) 40 MG tablet, Take 1 tablet (40 mg total) by mouth daily., Disp: 90 tablet, Rfl: 3   solifenacin (VESICARE) 10 MG tablet, TAKE 1 TABLET BY MOUTH EVERY DAY, Disp: 90 tablet, Rfl: 0   tiZANidine (ZANAFLEX) 4 MG tablet, Take 1 tablet (4 mg total) by mouth every 6 (six) hours as needed for muscle spasms., Disp: 90 tablet, Rfl: 4   torsemide (DEMADEX) 20 MG tablet, TAKE 2 TABLETS (40 MG TOTAL) BY MOUTH DAILY AS NEEDED (FLUID RETENTION)., Disp: 90 tablet, Rfl: 0   trimethoprim (TRIMPEX) 100 MG tablet, Take 1 tablet (100 mg total) by mouth daily., Disp: 30 tablet, Rfl: 3   venlafaxine XR (EFFEXOR-XR) 75 MG 24 hr capsule, Take 3 capsules (225 mg total) by mouth daily., Disp: 270 capsule, Rfl: 3   Past Medical History:  Diagnosis Date   Abdominal pain, chronic, right lower quadrant 12/13/2020    Anterior urethral stricture    Anxiety    Arthritis    a. knees, hips, hands;  b. 11/2013 s/p L TKA @ [facility].   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 Dr Graciela Husbands   Depression    Diabetes mellitus without complication (HCC)    DVT (deep venous thrombosis) (HCC)    Erosive esophagitis    GERD (gastroesophageal reflux disease)    Gross hematuria    Hyperlipemia    Hypertension    borderline   Internal hemorrhoids    Phimosis  Pulmonary embolism (HCC)    Assessment and Plan: 1. Chronic pain syndrome   2. Lumbar radiculopathy   3. Bilateral sciatica   4. Facet syndrome, lumbar   5. Bilateral hand pain   6. Chronic, continuous use of opioids   7. Osteoarthritis of right hip, unspecified osteoarthritis type   8. Facet arthritis of lumbosacral region   9. Spasm of muscle of lower back   10. Abdominal pain, chronic, right lower quadrant    Based our conversation and after review of the [facility] practitioner database information I think is appropriate to refill his medicines for the next 2 months dated for March 31 and April 30.  No other changes in his pharmacologic regimen will be initiated.  Continue with stretching strengthening efforts and weight loss.  I have encouraged him to get back to golf in late March as allowed by his cardiology doctors and we will schedule him for return to clinic in 2 months.  Continue follow-up with his primary care physician for baseline medical care.  Follow Up Instructions:    I discussed the assessment and treatment plan with the patient. The patient was provided an opportunity to ask questions and all were answered. The patient agreed with the plan and demonstrated an understanding of the instructions.   The patient was advised to call back or seek an in-person evaluation if the symptoms worsen or if the condition  fails to improve as anticipated.  I provided 30 minutes of non-face-to-face time during this encounter.   Yevette Edwards, MD this is good to hear your voice and we wish you well good luck to you get out of the course and I am

## 2023-04-18 ENCOUNTER — Ambulatory Visit: Payer: Medicare Other | Admitting: Urology

## 2023-04-18 VITALS — BP 143/81 | HR 90 | Ht 70.0 in | Wt 277.5 lb

## 2023-04-18 DIAGNOSIS — N400 Enlarged prostate without lower urinary tract symptoms: Secondary | ICD-10-CM

## 2023-04-18 LAB — BLADDER SCAN AMB NON-IMAGING: Scan Result: 14

## 2023-04-18 NOTE — Progress Notes (Signed)
 Nicholas [name],acting as a scribe for Vanna Scotland, MD.,have documented all relevant documentation on the behalf of Vanna Scotland, MD,as directed by  Vanna Scotland, MD while in the presence of Vanna Scotland, MD.  04/18/23 1:24 PM   Nicholas [name] January 20, 1946 [id number]  Referring provider: Marjie Skiff, NP [address] [place],  [place] [address]  Chief Complaint  Patient presents with   Benign Prostatic Hypertrophy    HPI: 78 y/o male presents for a routine annual follow-up.   He has a personal history of OAB, elevated PSA, BXO status post circumcision, and urethral stricture status post DVIU. He was last seen by Carman Ching, PA in January and by the provider a year ago. He has been experiencing recurrent urinary tract infections and was placed on suppressive trimethoprim for three months. He declined a cystoscopy for re evaluation of his urethral stricture.   His PSA has been stable, with the most recent value being 6.0 on 04/16/2023, which is within his normal range. The decision was made to defer further PSA testing due to stable levels and in consideration of his medical comorbidities.   He was previously on Vesicare for OAB but was advised to take a break to assess its necessity. He has since resumed the medication. He reports issues with constipation and has been advised to use Miralax and a stool softener like Colace or Metamucil Fiber.   He was admitted to the hospital for observation after an electrophysiology procedure since his last visit.   He is comfortable with the plan to stop PSA screening, as it aligns with current guidelines for his age group.   Prostate Specific Ag, Serum  Latest Ref Rng 0.0 - 4.0 ng/mL  07/16/2018 3.8   11/10/2019 5.0 (H)   02/11/2020 4.2 (H)   10/05/2020 4.9 (H)   11/16/2020 5.4 (H)   02/21/2021 4.6 (H)   08/24/2021 7.1 (H)   03/01/2022 6.2 (H)   09/05/2022 6.8 (H)   04/16/2023 6.0 (H)      Results for orders placed or performed  in visit on 04/18/23  Bladder Scan (Post Void Residual) in office  Result Value Ref Range   Scan Result 14 ml      IPSS     Row Name 04/18/23 1000         International Prostate Symptom Score   How often have you had the sensation of not emptying your bladder? Not at All     How often have you had to urinate less than every two hours? Not at All     How often have you found you stopped and started again several times when you urinated? Not at All     How often have you found it difficult to postpone urination? Not at All     How often have you had a weak urinary stream? Not at All     How often have you had to strain to start urination? Not at All     How many times did you typically get up at night to urinate? 1 Time     Total IPSS Score 1       Quality of Life due to urinary symptoms   If you were to spend the rest of your life with your urinary condition just the way it is now how would you feel about that? Pleased              Score:  1-7 Mild 8-19  Moderate 20-35 Severe  PMH: Past Medical History:  Diagnosis Date   Abdominal pain, chronic, right lower quadrant 12/13/2020   Anterior urethral stricture    Anxiety    Arthritis    a. knees, hips, hands;  b. 11/2013 s/p L TKA @ [facility].   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 Dr Graciela Husbands   Depression    Diabetes mellitus without complication (HCC)    DVT (deep venous thrombosis) (HCC)    Erosive esophagitis    GERD (gastroesophageal reflux disease)    Gross hematuria    Hyperlipemia    Hypertension    borderline   Internal hemorrhoids    Phimosis    Pulmonary embolism (HCC)     Surgical History: Past Surgical History:  Procedure Laterality Date   ATRIAL FIBRILLATION ABLATION N/A 05/17/2020   Procedure: ATRIAL FIBRILLATION ABLATION;  Surgeon: Lanier Prude, MD;  Location:  [facility] INVASIVE CV LAB;  Service: Cardiovascular;  Laterality: N/A;   BUNIONECTOMY Bilateral 01/06/2015   Procedure: Arbutus Leas;  Surgeon: Deeann Saint, MD;  Location: [facility] ORS;  Service: Orthopedics;  Laterality: Bilateral;   CARDIAC CATHETERIZATION  ~ 2005   "once"   CATARACT EXTRACTION W/ INTRAOCULAR LENS  IMPLANT, BILATERAL Bilateral ~ 2010   COLONOSCOPY WITH PROPOFOL N/A 12/07/2015   Procedure: COLONOSCOPY WITH PROPOFOL;  Surgeon: Christena Deem, MD;  Location: [facility] ENDOSCOPY;  Service: Endoscopy;  Laterality: N/A;   COLONOSCOPY WITH PROPOFOL N/A 11/26/2020   Procedure: COLONOSCOPY WITH PROPOFOL;  Surgeon: Toney Reil, MD;  Location: [facility] ENDOSCOPY;  Service: Gastroenterology;  Laterality: N/A;   EP IMPLANTABLE DEVICE N/A 02/16/2016   MDT dual chamber (His Bundle) pacemaker implanted by Dr Graciela Husbands for intermittent complete heart block   ESOPHAGOGASTRODUODENOSCOPY (EGD) WITH PROPOFOL N/A 12/07/2015   Procedure: ESOPHAGOGASTRODUODENOSCOPY (EGD) WITH PROPOFOL;  Surgeon: Christena Deem, MD;  Location[facility] ENDOSCOPY;  Service: Endoscopy;  Laterality: N/A;   ESOPHAGOGASTRODUODENOSCOPY (EGD) WITH PROPOFOL N/A 05/17/2017   Procedure: ESOPHAGOGASTRODUODENOSCOPY (EGD) WITH PROPOFOL;  Surgeon: Christena Deem, MD;  Location: [facility] ENDOSCOPY;  Service: Endoscopy;  Laterality: N/A;   HAMMER TOE SURGERY Bilateral 01/06/2015   Procedure: HAMMER TOE CORRECTION;  Surgeon: Deeann Saint, MD;  Location: [facility] ORS;  Service: Orthopedics;  Laterality: Bilateral;   KNEE CARTILAGE SURGERY Left 1965   "football injury"   LEAD EXTRACTION N/A 03/12/2023   Procedure: LEAD EXTRACTION;  Surgeon: Lanier Prude, MD;  Location[facility] INVASIVE CV LAB;  Service: Cardiovascular;  Laterality: N/A;   Left Total Knee Arthroplasty     a. 11/2013 [facility].   PACEMAKER IMPLANT  2017   PACEMAKER IMPLANT N/A 03/12/2023   Procedure: PACEMAKER IMPLANT;  Surgeon: Lanier Prude, MD;  Location: [facility] INVASIVE CV LAB;   Service: Cardiovascular;  Laterality: N/A;   PILONIDAL CYST EXCISION  1970's   TOTAL HIP ARTHROPLASTY Right 2004   TOTAL HIP ARTHROPLASTY Left 2006   TOTAL KNEE ARTHROPLASTY Right    UPPER GI ENDOSCOPY      Home Medications:  Allergies as of 04/18/2023   No Known Allergies      Medication List        Accurate as of April 18, 2023  1:24 PM. If you have any questions, ask your nurse or doctor.          acetaminophen 650 MG CR tablet Commonly  known as: TYLENOL Take 650 mg by mouth every 8 (eight) hours as needed for pain.   albuterol 108 (90 Base) MCG/ACT inhaler Commonly known as: VENTOLIN HFA INHALE 2 PUFFS BY MOUTH EVERY 6 HOURS AS NEEDED   apixaban 5 MG Tabs tablet Commonly known as: Eliquis Take 1 tablet (5 mg total) by mouth 2 (two) times daily.   calcium-vitamin D 500-5 MG-MCG tablet Commonly known as: OSCAL WITH D Take 1 tablet by mouth daily with breakfast.   Cranberry 500 MG Caps Take 500 mg by mouth daily.   Fish Oil 1200 MG Caps Take 1,200 mg by mouth daily.   gabapentin 600 MG tablet Commonly known as: NEURONTIN TAKE 1 TABLET BY MOUTH EVERYDAY AT BEDTIME   glucose blood test strip Use to check blood sugar 3 times a day and document results, bring to appointments.  Goal is <130 fasting blood sugar and <180 two hours after meals.   HYDROcodone-acetaminophen 5-325 MG tablet Commonly known as: NORCO/VICODIN Take 1 tablet by mouth 2 (two) times daily. Start taking on: April 23, 2023   HYDROcodone-acetaminophen 5-325 MG tablet Commonly known as: NORCO/VICODIN Take 1 tablet by mouth 2 (two) times daily. Start taking on: May 23, 2023   LORazepam 1 MG tablet Commonly known as: ATIVAN TAKE 1 TABLET BY MOUTH EVERYDAY AT BEDTIME   Magnesium 400 MG Tabs Take 400 mg by mouth daily.   MELATONIN PO Take 10 mg by mouth at bedtime.   midodrine 5 MG tablet Commonly known as: PROAMATINE Take 1 tablet (5 mg total) by mouth 3 (three) times daily with  meals.   multivitamin with minerals Tabs tablet Take 1 tablet by mouth daily. Centrum Silver   NON FORMULARY Pt uses a cpap nightly   nystatin cream Commonly known as: MYCOSTATIN Apply 1 Application topically 2 (two) times daily.   onetouch ultrasoft lancets Use to check blood sugar 3 times a day and document results, bring to appointments.  Goal is <130 fasting blood sugar and <180 two hours after meals.   OneTouch Verio w/Device Kit Use to check blood sugar 3 times a day and document results, bring to appointments.  Goal is <130 fasting blood sugar and <180 two hours after meals.   oxymetazoline 0.05 % nasal spray Commonly known as: AFRIN Place 2 sprays into both nostrils at bedtime.   pantoprazole 20 MG tablet Commonly known as: PROTONIX TAKE 1 TABLET (20 MG TOTAL) BY MOUTH 2 (TWO) TIMES DAILY BEFORE A MEAL. What changed: when to take this   potassium chloride SA 20 MEQ tablet Commonly known as: KLOR-CON M Take 2 tablets (40 meq) with Torsemide What changed:  how much to take how to take this when to take this reasons to take this additional instructions   QUEtiapine Fumarate 150 MG 24 hr tablet Commonly known as: SEROQUEL XR Take 1 tablet (150 mg total) by mouth at bedtime.   rOPINIRole 0.25 MG tablet Commonly known as: REQUIP Take 0.25 mg by mouth 4 (four) times daily.   rosuvastatin 40 MG tablet Commonly known as: CRESTOR Take 1 tablet (40 mg total) by mouth daily.   solifenacin 10 MG tablet Commonly known as: VESICARE TAKE 1 TABLET BY MOUTH EVERY DAY   tiZANidine 4 MG tablet Commonly known as: Zanaflex Take 1 tablet (4 mg total) by mouth every 6 (six) hours as needed for muscle spasms.   torsemide 20 MG tablet Commonly known as: DEMADEX TAKE 2 TABLETS (40 MG TOTAL) BY MOUTH DAILY AS  NEEDED (FLUID RETENTION).   trimethoprim 100 MG tablet Commonly known as: TRIMPEX Take 1 tablet (100 mg total) by mouth daily.   venlafaxine XR 75 MG 24 hr  capsule Commonly known as: EFFEXOR-XR Take 3 capsules (225 mg total) by mouth daily.   vitamin C 1000 MG tablet Take 1,000 mg by mouth daily.         Family History: Family History  Problem Relation Age of Onset   Alcoholism Mother        died in her 26's.   Cancer Mother    Stroke Father        deceased   Diabetes Father    Hypertension Father    Glaucoma Sister    Breast cancer Sister    Diabetes Daughter    Asthma Son    Stroke Paternal Grandmother    Prostate cancer Paternal Grandfather     Social History:  reports that he has never smoked. He has never used smokeless tobacco. He reports that he does not currently use alcohol. He reports that he does not use drugs.   Physical Exam: BP (!) 143/81   Pulse 90   Ht 5\' 10"  (1.778 m)   Wt 277 lb 8 oz (125.9 kg)   BMI 39.82 kg/m   Constitutional:  Alert and oriented, No acute distress. HEENT:  AT, moist mucus membranes.  Trachea midline, no masses. Neurologic: Grossly intact, no focal deficits, moving all 4 extremities. Psychiatric: Normal mood and affect.   Assessment & Plan:    1. OAB - He has been on Vesicare for a long time. Previously discussed taking a break from it to assess necessity.  - He decided to resume Vesicare due to urgency and frequency symptoms.  - Continue Vesicare as it seems to be beneficial.  2. Elevated PSA - His PSA is stable at 6.0, within his normal range.  - In light of stable PSA and age, further PSA testing will be deferred.  - Discussed with him the rationale based on guidelines, which suggest stopping PSA screening after age 62 due to the slow-growing nature of prostate cancer at this age and the potential for overtreatment.  3. Recurrent UTI - He was placed on suppressive trimethoprim for three months due to recurrent infections.  - The prescription is nearing completion.  - No current symptoms of UTI, so no urine test is needed today.  - Plan to monitor symptoms and avoid  long-term suppressive antibiotics unless necessary.  4. Constipation - He experiences constipation, possibly exacerbated by Vesicare.  - Recommended increasing water intake, using a stool softener like Colace or Metamucil Fiber twice daily, and taking Miralax as needed for constipation relief.  Return in about 1 year (around 04/17/2024) for PA visit with IPSS, PVR.  I have reviewed the above documentation for accuracy and completeness, and I agree with the above.   Vanna Scotland, MD   [facility] [address], [address] [place], [place] [address] [phone]

## 2023-04-19 ENCOUNTER — Encounter: Payer: Self-pay | Admitting: Internal Medicine

## 2023-04-19 ENCOUNTER — Ambulatory Visit: Payer: Medicare Other | Attending: Internal Medicine | Admitting: Internal Medicine

## 2023-04-19 ENCOUNTER — Other Ambulatory Visit: Payer: Self-pay | Admitting: Nurse Practitioner

## 2023-04-19 VITALS — BP 122/70 | HR 69 | Ht 70.0 in | Wt 278.0 lb

## 2023-04-19 DIAGNOSIS — I4819 Other persistent atrial fibrillation: Secondary | ICD-10-CM | POA: Diagnosis not present

## 2023-04-19 DIAGNOSIS — I10 Essential (primary) hypertension: Secondary | ICD-10-CM

## 2023-04-19 DIAGNOSIS — Z950 Presence of cardiac pacemaker: Secondary | ICD-10-CM | POA: Diagnosis not present

## 2023-04-19 DIAGNOSIS — I442 Atrioventricular block, complete: Secondary | ICD-10-CM

## 2023-04-19 LAB — CUP PACEART INCLINIC DEVICE CHECK
Date Time Interrogation Session: 20250327141730
Implantable Lead Connection Status: 753985
Implantable Lead Connection Status: 753985
Implantable Lead Implant Date: 20180124
Implantable Lead Implant Date: 20250217
Implantable Lead Location: 753859
Implantable Lead Location: 753860
Implantable Lead Model: 3830
Implantable Lead Model: 5076
Implantable Pulse Generator Implant Date: 20250217

## 2023-04-19 LAB — PACEMAKER DEVICE OBSERVATION

## 2023-04-19 MED ORDER — APIXABAN 5 MG PO TABS: 5.0000 mg | ORAL_TABLET | Freq: Two times a day (BID) | ORAL | 1 refills | Status: AC

## 2023-04-19 MED ORDER — MIDODRINE HCL 5 MG PO TABS: ORAL_TABLET | ORAL | 3 refills | Status: DC

## 2023-04-19 NOTE — Patient Instructions (Signed)
 Medication Instructions:  Increase Proamatine (Midodrine) 10 mg in the morning, 5 mg for the afternoon, 5 mg in the evening.   *If you need a refill on your cardiac medications before your next appointment, please call your pharmacy*  Follow-Up: At [facility], you and your health needs are our priority.  As part of our continuing mission to provide you with exceptional heart care, we have created designated Provider Care Teams.  These Care Teams include your primary Cardiologist (physician) and Advanced Practice Providers (APPs -  Physician Assistants and Nurse Practitioners) who all work together to provide you with the care you need, when you need it.  We recommend signing up for the patient portal called "MyChart".  Sign up information is provided on this After Visit Summary.  MyChart is used to connect with patients for Virtual Visits (Telemedicine).  Patients are able to view lab/test results, encounter notes, upcoming appointments, etc.  Non-urgent messages can be sent to your provider as well.   To learn more about what you can do with MyChart, go to [email or web address].    Your next appointment:   Keep follow up appointment    Keep bottle of water by your bed, drink this in the morning.

## 2023-04-19 NOTE — Progress Notes (Unsigned)
 Patient Care Team: Marjie Skiff, NP as PCP - General (Nurse Practitioner) Lanier Prude, MD as PCP - Electrophysiology (Cardiology) Duke Salvia, MD as PCP - Cardiology (Cardiology) Deeann Saint, MD (Specialist) Vanna Scotland, MD as Consulting Physician (Urology) Nyoka Cowden, MD as Consulting Physician (Pulmonary Disease) Yevette Edwards, MD as Consulting Physician (Anesthesiology) Duke Salvia, MD as Consulting Physician (Cardiology) Gustavus Bryant, LCSW as Social Worker (Licensed Clinical Social Worker) Juanell Fairly, RN as Registered Nurse   HPI  Nicholas [name] is a 78 y.o. male Seen in follow-up for His bundle pacemaker implanted 1/18 for symptomatic bradycardia  and profound first degree AV block with intermittent third and second-degree heart block; also atrial fibrillation, previously on flecainide but discontinued because of coronary disease diagnosed by CTA 10/21.>> propafenone with subsequent increase in burden  Saw CL and underwent RFCA-PVI 4/22  Underwent 2/25 lead extraction and insertion of LBBA pacing lead (CL); tolerated well.  Denies chest pain.  Some mild shortness of breath which is chronic.  Wife has had surgery and he has been doing a great job caring for her.   Also Orthostatic hypotension.  Still struggles with dizziness particularly in the morning.  Hx of pulmonary embolism with repeat venous Dopplers 1/16 demonstrated small clots Managed with chronic apixaban DATE TEST EF   10/15 echo     50 %   Mild RV dysfunction   11/17 echo    60 %    Nl RV function   11/17 Myoview   46 ([facility])   10/21 Cardiac-CTA  LADd- FFR 0.75; o/w >90  10/21 Echo 60-65%   10/21 CTA-PE    3/24 CTA-PE  neg  3/24 Echo  60-65%            Date Cr K LDL Hgb  6/19 1.08   14.6  6/20 1.13   14.7  6/21 1.17<<1.46   15.6  1/22 1.34 4.2 85 13.6 (10/21)  8/22 0.9 4.6 65 15.5  3./23 0.98 4.1 67 15.1  3/24 0.91 4.6  15.5  3/25 1.12 5.0 55 14.7      Thromboembolic risk factors ( age -18 , HTN-1) for a CHADSVASc Score of 3      Past Medical History:  Diagnosis Date   Abdominal pain, chronic, right lower quadrant 12/13/2020   Anterior urethral stricture    Anxiety    Arthritis    a. knees, hips, hands;  b. 11/2013 s/p L TKA @ [facility].   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 Dr Graciela Husbands   Depression    Diabetes mellitus without complication (HCC)    DVT (deep venous thrombosis) (HCC)    Erosive esophagitis    GERD (gastroesophageal reflux disease)    Gross hematuria    Hyperlipemia    Hypertension    borderline   Internal hemorrhoids    Phimosis    Pulmonary embolism (HCC)     Past Surgical History:  Procedure Laterality Date   ATRIAL FIBRILLATION ABLATION N/A 05/17/2020   Procedure: ATRIAL FIBRILLATION ABLATION;  Surgeon: Lanier Prude, MD;  Location: [facility] INVASIVE CV LAB;  Service: Cardiovascular;  Laterality: N/A;   BUNIONECTOMY Bilateral 01/06/2015   Procedure: Arbutus Leas;  Surgeon: Deeann Saint, MD;  Location: [facility] ORS;  Service: Orthopedics;  Laterality: Bilateral;  CARDIAC CATHETERIZATION  ~ 2005   "once"   CATARACT EXTRACTION W/ INTRAOCULAR LENS  IMPLANT, BILATERAL Bilateral ~ 2010   COLONOSCOPY WITH PROPOFOL N/A 12/07/2015   Procedure: COLONOSCOPY WITH PROPOFOL;  Surgeon: Christena Deem, MD;  Location: [facility] ENDOSCOPY;  Service: Endoscopy;  Laterality: N/A;   COLONOSCOPY WITH PROPOFOL N/A 11/26/2020   Procedure: COLONOSCOPY WITH PROPOFOL;  Surgeon: Toney Reil, MD;  Location: [facility] ENDOSCOPY;  Service: Gastroenterology;  Laterality: N/A;   EP IMPLANTABLE DEVICE N/A 02/16/2016   MDT dual chamber (His Bundle) pacemaker implanted by Dr Graciela Husbands for intermittent complete heart block   ESOPHAGOGASTRODUODENOSCOPY (EGD) WITH PROPOFOL N/A 12/07/2015   Procedure:  ESOPHAGOGASTRODUODENOSCOPY (EGD) WITH PROPOFOL;  Surgeon: Christena Deem, MD;  Location: [facility] ENDOSCOPY;  Service: Endoscopy;  Laterality: N/A;   ESOPHAGOGASTRODUODENOSCOPY (EGD) WITH PROPOFOL N/A 05/17/2017   Procedure: ESOPHAGOGASTRODUODENOSCOPY (EGD) WITH PROPOFOL;  Surgeon: Christena Deem, MD;  Location: [facility] ENDOSCOPY;  Service: Endoscopy;  Laterality: N/A;   HAMMER TOE SURGERY Bilateral 01/06/2015   Procedure: HAMMER TOE CORRECTION;  Surgeon: Deeann Saint, MD;  Location: [facility] ORS;  Service: Orthopedics;  Laterality: Bilateral;   KNEE CARTILAGE SURGERY Left 1965   "football injury"   LEAD EXTRACTION N/A 03/12/2023   Procedure: LEAD EXTRACTION;  Surgeon: Lanier Prude, MD;  Location: [facility] INVASIVE CV LAB;  Service: Cardiovascular;  Laterality: N/A;   Left Total Knee Arthroplasty     a. 11/2013 [facility].   PACEMAKER IMPLANT  2017   PACEMAKER IMPLANT N/A 03/12/2023   Procedure: PACEMAKER IMPLANT;  Surgeon: Lanier Prude, MD;  Location: [facility] INVASIVE CV LAB;  Service: Cardiovascular;  Laterality: N/A;   PILONIDAL CYST EXCISION  1970's   TOTAL HIP ARTHROPLASTY Right 2004   TOTAL HIP ARTHROPLASTY Left 2006   TOTAL KNEE ARTHROPLASTY Right    UPPER GI ENDOSCOPY      Current Outpatient Medications  Medication Sig Dispense Refill   acetaminophen (TYLENOL) 650 MG CR tablet Take 650 mg by mouth every 8 (eight) hours as needed for pain.     albuterol (VENTOLIN HFA) 108 (90 Base) MCG/ACT inhaler INHALE 2 PUFFS BY MOUTH EVERY 6 HOURS AS NEEDED 6.7 each 2   apixaban (ELIQUIS) 5 MG TABS tablet Take 1 tablet (5 mg total) by mouth 2 (two) times daily. 180 tablet 1   Ascorbic Acid (VITAMIN C) 1000 MG tablet Take 1,000 mg by mouth daily.     Blood Glucose Monitoring Suppl (ONETOUCH VERIO) w/Device KIT Use to check blood sugar 3 times a day and document results, bring to appointments.  Goal is <130 fasting blood sugar and <180 two hours after meals. 1 kit 0   calcium-vitamin D (OSCAL WITH D) 500-5  MG-MCG tablet Take 1 tablet by mouth daily with breakfast.     Cranberry 500 MG CAPS Take 500 mg by mouth daily.     gabapentin (NEURONTIN) 600 MG tablet TAKE 1 TABLET BY MOUTH EVERYDAY AT BEDTIME 90 tablet 4   glucose blood test strip Use to check blood sugar 3 times a day and document results, bring to appointments.  Goal is <130 fasting blood sugar and <180 two hours after meals. 100 each 12   [START ON 04/23/2023] HYDROcodone-acetaminophen (NORCO/VICODIN) 5-325 MG tablet Take 1 tablet by mouth 2 (two) times daily. 60 tablet 0   [START ON 05/23/2023] HYDROcodone-acetaminophen (NORCO/VICODIN) 5-325 MG tablet Take 1 tablet by mouth 2 (two) times daily. 60 tablet 0   Lancets (ONETOUCH ULTRASOFT) lancets Use to check blood sugar  3 times a day and document results, bring to appointments.  Goal is <130 fasting blood sugar and <180 two hours after meals. 100 each 12   LORazepam (ATIVAN) 1 MG tablet TAKE 1 TABLET BY MOUTH EVERYDAY AT BEDTIME 90 tablet 0   Magnesium 400 MG TABS Take 400 mg by mouth daily.      MELATONIN PO Take 10 mg by mouth at bedtime.     midodrine (PROAMATINE) 5 MG tablet Take 1 tablet (5 mg total) by mouth 3 (three) times daily with meals. 270 tablet 3   Multiple Vitamin (MULTIVITAMIN WITH MINERALS) TABS tablet Take 1 tablet by mouth daily. Centrum Silver     NON FORMULARY Pt uses a cpap nightly     nystatin cream (MYCOSTATIN) Apply 1 Application topically 2 (two) times daily. 30 g 4   Omega-3 Fatty Acids (FISH OIL) 1200 MG CAPS Take 1,200 mg by mouth daily.     oxymetazoline (AFRIN) 0.05 % nasal spray Place 2 sprays into both nostrils at bedtime.     pantoprazole (PROTONIX) 20 MG tablet TAKE 1 TABLET (20 MG TOTAL) BY MOUTH 2 (TWO) TIMES DAILY BEFORE A MEAL. (Patient taking differently: Take 20 mg by mouth daily.) 180 tablet 2   potassium chloride SA (KLOR-CON M) 20 MEQ tablet Take 2 tablets (40 meq) with Torsemide (Patient taking differently: Take 40 mEq by mouth daily as needed (when  taking torsemide).) 30 tablet 3   QUEtiapine Fumarate (SEROQUEL XR) 150 MG 24 hr tablet Take 1 tablet (150 mg total) by mouth at bedtime. 90 tablet 4   rOPINIRole (REQUIP) 0.25 MG tablet Take 0.25 mg by mouth 4 (four) times daily.     rosuvastatin (CRESTOR) 40 MG tablet Take 1 tablet (40 mg total) by mouth daily. 90 tablet 3   solifenacin (VESICARE) 10 MG tablet TAKE 1 TABLET BY MOUTH EVERY DAY 90 tablet 0   tiZANidine (ZANAFLEX) 4 MG tablet Take 1 tablet (4 mg total) by mouth every 6 (six) hours as needed for muscle spasms. 90 tablet 4   torsemide (DEMADEX) 20 MG tablet TAKE 2 TABLETS (40 MG TOTAL) BY MOUTH DAILY AS NEEDED (FLUID RETENTION). 90 tablet 0   trimethoprim (TRIMPEX) 100 MG tablet Take 1 tablet (100 mg total) by mouth daily. 30 tablet 3   venlafaxine XR (EFFEXOR-XR) 75 MG 24 hr capsule Take 3 capsules (225 mg total) by mouth daily. 270 capsule 3   No current facility-administered medications for this visit.    No Known Allergies    Review of Systems negative except from HPI and PMH  Physical Exam  BP 122/70 (BP Location: Left Arm)   Pulse 69   Ht 5\' 10"  (1.778 m)   Wt 278 lb (126.1 kg)   SpO2 95%   BMI 39.89 kg/m  Well developed and well nourished in no acute distress HENT normal Neck supple with JVP-flat Clear Device pocket well healed; without hematoma or erythema.  There is no tethering  Regular rate and rhythm, no  gallop No  murmur Abd-soft with active BS No Clubbing cyanosis edema Skin-warm and dry A & Oriented  Grossly normal sensory and motor function  ECG AV pacing QRSd 115 msec   Device function is normal. Programming changes none  See Paceart for details    Device function is abnormal.  With increasing RV pacing threshold Programming changes RV pacing output increased to 4 V at 1.0 ms for threshold of 2.25 V See Paceart for details  Assessment and  Plan HFpEF  /chronic  Complete heart block  Pacemaker implantation left bundle  branch.  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  Not very active. But his wife says he is doing a really good job taking care of her following her foot surgery.  No bleeding.  Will continue anticoagulation with Eliquis.  It continues to be a cost burden.  Will have him follow-up with Dr. Merita Norton in about 2 months for his 61-month post pacemaker follow-up and at that time they can discuss Watchman  Orthostasis continues to be a problem worse in the morning.  Have suggested a bottle of water by his bedside in the morning, to consider raising the head of the bed 4 inches, as those things are insufficient to increase his ProAmatine to 10 mg in the morning followed by 5 mg for his second and third dose.

## 2023-04-20 NOTE — Telephone Encounter (Signed)
 Requested Prescriptions  Pending Prescriptions Disp Refills   solifenacin (VESICARE) 10 MG tablet [Pharmacy Med Name: SOLIFENACIN 10 MG TABLET] 90 tablet 0    Sig: TAKE 1 TABLET BY MOUTH EVERY DAY     Urology:  Bladder Agents 2 Passed - 04/20/2023  3:36 PM      Passed - Cr in normal range and within 360 days    Creatinine  Date Value Ref Range Status  02/26/2014 0.94 0.60 - 1.30 mg/dL Final   Creatinine, Ser  Date Value Ref Range Status  04/02/2023 1.12 0.76 - 1.27 mg/dL Final         Passed - ALT in normal range and within 360 days    ALT  Date Value Ref Range Status  04/02/2023 27 0 - 44 IU/L Final   SGPT (ALT)  Date Value Ref Range Status  04/07/2013 22 12 - 78 U/L Final   ALT (SGPT) Piccolo, Waived  Date Value Ref Range Status  01/03/2018 24 10 - 47 U/L Final         Passed - AST in normal range and within 360 days    AST  Date Value Ref Range Status  04/02/2023 28 0 - 40 IU/L Final   SGOT(AST)  Date Value Ref Range Status  04/07/2013 28 15 - 37 Unit/L Final   AST (SGOT) Piccolo, Waived  Date Value Ref Range Status  01/03/2018 34 11 - 38 U/L Final         Passed - Valid encounter within last 12 months    Recent Outpatient Visits           2 weeks ago Type 2 diabetes mellitus with obesity (HCC)   [facility] [facility] [place], [name], NP       Future Appointments             In 12 months [place], [place] [facility] [place]

## 2023-05-02 NOTE — Telephone Encounter (Signed)
 At 8 weeks post procedure in February, ok to teturn to driving Putting and chipping are ok even now [facility] this helps SK

## 2023-05-09 ENCOUNTER — Other Ambulatory Visit: Payer: Self-pay

## 2023-05-09 NOTE — Patient Instructions (Signed)
 Thank you for allowing the Care Management team to participate in your care. It was great speaking with you! Stay motivated and allow time to gradually reintegrate physical activity into your daily routine.  We will follow up on Jun 22, 2023 at 0930. Please do not hesitate to contact me if you require assistance prior to our next outreach.    Roxie Cord [facility] Population Health RN Care Manager Direct Dial: [phone]  Fax: [phone] Website: [email or web address]

## 2023-05-09 NOTE — Patient Outreach (Unsigned)
 Complex Care Management   Visit Note  05/09/2023  Name:  Nicholas [name] MRN: [id number] DOB: 1945-03-01  Situation: Referral received for Complex Care Management related to Atrial Fibrillation, HTN, HLD  and DM. I obtained verbal consent from Patient.  Visit completed with Nicholas [name] via telephone.    Background:   Past Medical History:  Diagnosis Date   Abdominal pain, chronic, right lower quadrant 12/13/2020   Anterior urethral stricture    Anxiety    Arthritis    a. knees, hips, hands;  b. 11/2013 s/p L TKA @ [facility].   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 Dr Rodolfo Clan   Depression    Diabetes mellitus without complication (HCC)    DVT (deep venous thrombosis) (HCC)    Erosive esophagitis    GERD (gastroesophageal reflux disease)    Gross hematuria    Hyperlipemia    Hypertension    borderline   Internal hemorrhoids    Phimosis    Pulmonary embolism (HCC)      Assessment: Patient Reported Symptoms: Cognitive Cognitive Status: Alert and oriented to person, place, and time, Normal speech and language skills Cognitive/Intellectual Conditions Management [RPT]: None reported or documented in medical history or problem list   Health Maintenance Behaviors: Sleep adequate Healing Pattern: Unsure Health Facilitated by: Rest, Stress management  Neurological   Neurological Comment: No Symptoms Reported  HEENT HEENT Symptoms Reported: No symptoms reported HEENT Comment: No Symptoms Reported    Cardiovascular Cardiovascular Symptoms Reported: Fatigue Does patient have uncontrolled Hypertension?: No Cardiovascular Conditions: Coronary artery disease, Dysrhythmia, Heart failure, High blood cholesterol, Hypertension (Has a pacemaker) Cardiovascular Management Strategies: Adequate rest, Coping strategies, Medical device, Medication therapy, Routine  screening Cardiovascular Self-Management Outcome: 3 (uncertain)  Respiratory Respiratory Symptoms Reported: No symptoms reported Respiratory Conditions: Sleep disordered breathing Respiratory Self-Management Outcome: 4 (good)  Endocrine Patient reports the following symptoms related to hypoglycemia or hyperglycemia : No symptoms reported Is patient diabetic?: Yes Is patient checking blood sugars at home?: No (Provided education regarding importance of monitoring blood sugars and maintaining a log) Endocrine Management Strategies: Diet modification, Medication therapy, Routine screening, Medical device Endocrine Self-Management Outcome: 4 (good)  Gastrointestinal Gastrointestinal Symptoms Reported: No symptoms reported Gastrointestinal Conditions: Reflux/heartburn Gastrointestinal Management Strategies: Diet modification, Coping strategies, Medication therapy Gastrointestinal Self-Management Outcome: 4 (good)    Genitourinary   Genitourinary Conditions: Chronic kidney disease (BPH) Genitourinary Management Strategies: Medication therapy Genitourinary Self-Management Outcome: 4 (good)  Integumentary Integumentary Symptoms Reported: No symptoms reported Skin Comment: No Symptoms Reported  Musculoskeletal Musculoskelatal Symptoms Reviewed: No symptoms reported Musculoskeletal Conditions:  (History of left knee replacement and joint pain) Musculoskeletal Management Strategies: Medication therapy, Routine screening Musculoskeletal Self-Management Outcome: 4 (good) Falls in the past year?: No Number of falls in past year: 1 or less Was there an injury with Fall?: No Fall Risk Category Calculator: 0 Patient Fall Risk Level: Low Fall Risk Patient at Risk for Falls Due to: Medication side effect, Other (Comment) (Reports significant decline in physical tolerance following cardiac lead batter change)  Psychosocial Psychosocial Symptoms Reported: Other (Reports feeling down several days due to  decline in activity tolerance) Behavioral Health Conditions: Depression, Anxiety Behavioral Management Strategies: Coping strategies, Medication therapy, Support system, Adequate rest Behavioral Health Self-Management Outcome: 4 (good) Major Change/Loss/Stressor/Fears (CP): Medical condition, self Behaviors When Feeling Stressed/Fearful: Rest Techniques to  Cope with Loss/Stress/Change:  (Rest) Quality of Family Relationships: supportive Do you feel physically threatened by others?: No      05/09/2023    2:30 PM  Depression screen PHQ 2/9  Decreased Interest 0  Down, Depressed, Hopeless 1  PHQ - 2 Score 1    There were no vitals filed for this visit.  Medications Reviewed Today     Reviewed by Juanell Fairly, RN (Registered Nurse) on 05/09/23 at 2240  Med List Status: <None>   Medication Order Taking? Sig Documenting Provider Last Dose Status Informant  acetaminophen (TYLENOL) 650 MG CR tablet [id number] No Take 650 mg by mouth every 8 (eight) hours as needed for pain. [provider] Taking Active Spouse/Significant Other  albuterol (VENTOLIN HFA) 108 (90 Base) MCG/ACT inhaler [id number] No INHALE 2 PUFFS BY MOUTH EVERY 6 HOURS AS NEEDED Cannady, Jolene T, NP Taking Active Spouse/Significant Other  apixaban (ELIQUIS) 5 MG TABS tablet [id number]  Take 1 tablet (5 mg total) by mouth 2 (two) times daily. Duke Salvia, MD  Active   Ascorbic Acid (VITAMIN C) 1000 MG tablet [id number] No Take 1,000 mg by mouth daily. [provider] Taking Active Spouse/Significant Other  Blood Glucose Monitoring Suppl (ONETOUCH VERIO) w/Device KIT [id number] No Use to check blood sugar 3 times a day and document results, bring to appointments.  Goal is <130 fasting blood sugar and <180 two hours after meals. Marjie Skiff, NP Taking Active Spouse/Significant Other  calcium-vitamin D (OSCAL WITH D) 500-5 MG-MCG tablet [id number] No Take 1 tablet by mouth daily with breakfast. [provider] Taking Active Spouse/Significant Other  Cranberry 500 MG CAPS [id number] No Take 500 mg by mouth daily. [provider] Taking Active Spouse/Significant Other  gabapentin (NEURONTIN) 600 MG tablet [id number] No TAKE 1 TABLET BY MOUTH EVERYDAY AT BEDTIME Cannady, Jolene T, NP Taking Active Spouse/Significant Other  glucose blood test strip [id number] No Use to check blood sugar 3 times a day and document results, bring to appointments.  Goal is <130 fasting blood sugar and <180 two hours after meals. Aura Dials T, NP Taking Active Spouse/Significant Other  HYDROcodone-acetaminophen (NORCO/VICODIN) 5-325 MG tablet [id number] No Take 1 tablet by mouth 2 (two) times daily. Yevette Edwards, MD Taking Active   HYDROcodone-acetaminophen (NORCO/VICODIN) 5-325 MG tablet [id number] No Take 1 tablet by mouth 2 (two) times daily. Yevette Edwards, MD Taking Active   Lancets [facility] ULTRASOFT) lancets [id number] No Use to check blood sugar 3 times a day and document results, bring to appointments.  Goal is <130 fasting blood sugar and <180 two hours after meals. Aura Dials T, NP Taking Active Spouse/Significant Other  LORazepam (ATIVAN) 1 MG tablet [id number] No TAKE 1 TABLET BY MOUTH EVERYDAY AT BEDTIME Cannady, Jolene T, NP Taking Active   Magnesium 400 MG TABS [id number] No Take 400 mg by mouth daily.  [provider] Taking Active Spouse/Significant Other  MELATONIN PO [id number] No Take 10 mg by mouth at bedtime. [provider] Taking Active Spouse/Significant Other  midodrine (PROAMATINE) 5 MG tablet [id number]  Take 10 mg in the morning, take 5 mg in the afternoon, and 5 mg in the evening Duke Salvia, MD  Active   Multiple Vitamin (MULTIVITAMIN WITH MINERALS) TABS tablet [id number] No Take 1 tablet by mouth daily. Centrum Silver [provider] Taking Active Spouse/Significant Other  NON FORMULARY [id number] No Pt uses a cpap nightly [provider]  Taking Active Spouse/Significant Other  nystatin cream (MYCOSTATIN)  [id number] No Apply 1 Application topically 2 (two) times daily. Solomon Dupre, DO Taking Active Spouse/Significant Other  Omega-3 Fatty Acids (FISH OIL) 1200 MG CAPS [id number] No Take 1,200 mg by mouth daily. [provider] Taking Active Spouse/Significant Other  oxymetazoline (AFRIN) 0.05 % nasal spray [id number] No Place 2 sprays into both nostrils at bedtime. [provider] Taking Active Spouse/Significant Other  pantoprazole (PROTONIX) 20 MG tablet [id number] No TAKE 1 TABLET (20 MG TOTAL) BY MOUTH 2 (TWO) TIMES DAILY BEFORE A MEAL.  Patient taking differently: Take 20 mg by mouth daily.   Lemar Pyles, NP Taking Active Spouse/Significant Other  potassium chloride SA (KLOR-CON M) 20 MEQ tablet [id number] No Take 2 tablets (40 meq) with Torsemide  Patient taking differently: Take 40 mEq by mouth daily as needed (when taking torsemide).   Cannady, Jolene T, NP Taking Active Spouse/Significant Other  QUEtiapine Fumarate (SEROQUEL XR) 150 MG 24 hr tablet [id number] No Take 1 tablet (150 mg total) by mouth at bedtime. Cannady, Jolene T, NP Taking Active   rOPINIRole (REQUIP) 0.25 MG tablet [id number] No Take 0.25 mg by mouth 4 (four) times daily. [provider] Taking Active Spouse/Significant Other  rosuvastatin (CRESTOR) 40 MG tablet [id number] No Take 1 tablet (40 mg total) by mouth daily. Cannady, Jolene T, NP Taking Active   solifenacin (VESICARE) 10 MG tablet [id number]  TAKE 1 TABLET BY MOUTH EVERY DAY Cannady, Jolene T, NP  Active   tiZANidine (ZANAFLEX) 4 MG tablet [id number] No Take 1 tablet (4 mg total) by mouth every 6 (six) hours as needed for muscle spasms. Cannady, Jolene T, NP Taking Active Spouse/Significant Other  torsemide (DEMADEX) 20 MG tablet [id number] No TAKE 2 TABLETS (40 MG TOTAL) BY MOUTH DAILY AS NEEDED (FLUID RETENTION). Verona Goodwill, MD Taking Active Spouse/Significant Other   trimethoprim (TRIMPEX) 100 MG tablet [id number] No Take 1 tablet (100 mg total) by mouth daily. Vaillancourt, Samantha, PA-C Taking Active Spouse/Significant Other  venlafaxine XR (EFFEXOR-XR) 75 MG 24 hr capsule [id number] No Take 3 capsules (225 mg total) by mouth daily. Lemar Pyles, NP Taking Active   Med List Note [place], [place] 04/18/23 1326): UDS 12-14-22 MR 06-22-23  When searching in control substance database search exactly this: X[employer] Freiberger jr.            Recommendation:   Complete medical appointments as scheduled  Follow Up Plan:   Telephone follow up appointment on Jun 22, 2023   Roxie Cord [facility] RN Care Manager Direct Dial: [phone]  Fax: [phone] Website: [email or web address]

## 2023-05-22 ENCOUNTER — Encounter: Payer: Self-pay | Admitting: Internal Medicine

## 2023-06-12 ENCOUNTER — Ambulatory Visit (INDEPENDENT_AMBULATORY_CARE_PROVIDER_SITE_OTHER): Payer: Self-pay | Admitting: Emergency Medicine

## 2023-06-12 VITALS — Ht 70.0 in | Wt 275.0 lb

## 2023-06-12 DIAGNOSIS — Z0000 Encounter for general adult medical examination without abnormal findings: Secondary | ICD-10-CM

## 2023-06-12 DIAGNOSIS — I495 Sick sinus syndrome: Secondary | ICD-10-CM | POA: Insufficient documentation

## 2023-06-12 NOTE — Progress Notes (Signed)
 Electrophysiology Clinic Note    Date:  06/13/2023  Patient ID:  [name], Nicholas [birth year], MRN [id number] PCP:  Lemar Pyles, NP  Cardiologist:  None Electrophysiologist: Dr. Rodolfo Clan >  Nicholas Byes, MD   Discussed the use of AI scribe software for clinical note transcription with the patient, who gave verbal consent to proceed.   Patient Profile    Chief Complaint: routine 91d device follow-up  History of Present Illness: Nicholas [name] is a 78 y.o. 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   He is s/p AF ablation w isolation of pulm vein in 2022 by Dr. Marven Slimmer.   He is now s/p gen change with extraction of HIS bundle lead and implantation of left bundle area lead on 02/2023.   On follow-up today, he is doing very well. He recent started midodrine  for dizziness and has not had any dizzy episodes since starting the medication. He works out at gym regularly, has avoided overdoing it with his L shoulder movements. Otherwise, does not notice his PPM at all. He continues to take eliquis  BID, no bleeding concerns.  His wife is concerned about the cost of eliquis , gets it from [place]. They question whether a watchman procedure would be appropriate.  He has not had any AFib episodes that he is aware of.   He denies chest pain, chest pressure, palpitations.    Patient's wife joined for appt, whom I have met before.   Arrhythmia/Device History Medtronic dual chamber PPM, imp 2018; SND  02/2023 - gen change and HIS bundle lead extraction, imp LBBA lead   AAD -  Flecainide  - stopped d/t CAD Propafenone  - ineffective     ROS:  Please see the history of present illness. All other systems are reviewed and otherwise negative.    Physical Exam    VS:   BP 138/86 (BP Location: Left Arm, Patient Position: Sitting)   Pulse 86   Ht 5\' 10"  (1.778 m)   Wt 277 lb 9.6 oz (125.9 kg)   SpO2 91%   BMI 39.83 kg/m  BMI: Body mass index is 39.83 kg/m.  Wt Readings from Last 3 Encounters:  06/13/23 277 lb 9.6 oz (125.9 kg)  06/12/23 275 lb (124.7 kg)  04/19/23 278 lb (126.1 kg)     GEN- The patient is well appearing, alert and oriented x 3 today.   Lungs- Clear to ausculation bilaterally, normal work of breathing.  Heart- Regular rate and rhythm, no murmurs, rubs or gallops Extremities- No peripheral edema, warm, dry Skin-  device pocket well-healed, no tethering   Device interrogation done today and reviewed by myself:  Battery 12.5 years Lead thresholds, impedence, sensing stable  Dependent No episodes  No changes made today   Studies Reviewed   Previous EP, cardiology notes.    EKG is ordered. Personal review of EKG from today shows:    EKG Interpretation Date/Time:  Wednesday Jun 13 2023 10:38:32 EDT Ventricular Rate:  82 PR Interval:  210 QRS Duration:  118 QT Interval:  410 QTC Calculation: 479 R Axis:   -46  Text Interpretation: Atrial-sensed ventricular-paced rhythm with prolonged AV conduction Confirmed by Jamea Robicheaux [phone]) on 06/13/2023 10:40:41 AM    TTE, 04/06/2022  1. Left ventricular ejection fraction,  by estimation, is 60 to 65%. Left ventricular ejection fraction by PLAX is 52 %. The left ventricle has normal function. The left ventricle has no regional wall motion abnormalities. There is mild left ventricular hypertrophy. Left ventricular diastolic parameters are indeterminate.   2. Right ventricular systolic function is normal. The right ventricular size is normal.   3. The mitral valve is normal in structure. No evidence of mitral valve regurgitation. No evidence of mitral stenosis.   4. The aortic valve is normal in structure. Aortic valve regurgitation is not visualized. Aortic valve sclerosis is present,  with no evidence of aortic valve stenosis.   5. There is mild dilatation of the ascending aorta, measuring 40 mm. There is borderline dilatation of the aortic root, measuring 39 mm.   6. The inferior vena cava is normal in size with greater than 50% respiratory variability, suggesting right atrial pressure of 3 mmHg.   Comparison(s): Previous Echo showed LV EF 60-65%, no RWMA, grade I  diastolic dysfunction, aortic root 39mm, ascending aorta 41mm.    Assessment and Plan     #) SND s/p PPM S/p recent HIS bundle lead extraction and implantation of LB lead Narrow QRS on EKG High VP  #) persis afib No recent episodes  #) Hypercoag d/t persis afib CHA2DS2-VASc Score = at least 4 [CHF History: 0, HTN History: 1, Diabetes History: 0, Stroke History: 0, Vascular Disease History: 1, Age Score: 2, Gender Score: 0].  Therefore, the patient's annual risk of stroke is 4.8 %.    Stroke ppx - 5mg  eliquis  BID, appropriately dosed No bleeding concerns Long discussion about whether LAAO would be appropriate with patient's chronic DVT, h/o PE. Prior to moving forward with LAAO procedure, I've asked him to discuss treatment of his DVT and PE with his PCP to determine safety of stopping eliquis . Also discussed eliquis  affordability, offered to send test claim to [facility] pharmacy, or switch to xarelto  or warfarin.  Patient's wife did not wish to proceed with this, will continue to obtain medication from [place] at this time  #) HTN #) orthostatic hypotension Significant improvement in morning dizziness since initiating midodrine  Will continue at 10mg  in AM, 5mg  for 2nd and 3rd dose       Current medicines are reviewed at length with the patient today.   The patient has concerns regarding his medicines.  The following changes were made today:  none  Labs/ tests ordered today include:  Orders Placed This Encounter  Procedures   EKG 12-Lead     Disposition: Follow up with Dr. Marven Slimmer or EP APP in 6  months   Signed, Deretha Ertle, NP  06/13/23  12:45 PM  Electrophysiology [facility]

## 2023-06-12 NOTE — Progress Notes (Signed)
 Subjective:   Nicholas [name] is a 78 y.o. who presents for a Medicare Wellness preventive visit.  As a reminder, Annual Wellness Visits don't include a physical exam, and some assessments may be limited, especially if this visit is performed virtually. We may recommend an in-person follow-up visit with your provider if needed.  Visit Complete: Virtual I connected with  Nicholas [name] on 06/12/23 by a audio enabled telemedicine application and verified that I am speaking with the correct person using two identifiers.  Patient Location: Home  Provider Location: Home Office  I discussed the limitations of evaluation and management by telemedicine. The patient expressed understanding and agreed to proceed.  Vital Signs: Because this visit was a virtual/telehealth visit, some criteria may be missing or patient reported. Any vitals not documented were not able to be obtained and vitals that have been documented are patient reported.  VideoDeclined- This patient declined Librarian, academic. Therefore the visit was completed with audio only.  Persons Participating in Visit: Patient assisted by Larence Pleas, wife.  AWV Questionnaire: No: Patient Medicare AWV questionnaire was not completed prior to this visit.  Cardiac Risk Factors include: advanced age (>6men, >76 women);male gender;diabetes mellitus;hypertension;dyslipidemia;obesity (BMI >30kg/m2);Other (see comment), Risk factor comments: OSA (cpap)     Objective:     Today's Vitals   06/12/23 0839  Weight: 275 lb (124.7 kg)  Height: 5\' 10"  (1.778 m)   Body mass index is 39.46 kg/m.     06/12/2023    8:56 AM 05/09/2023    2:40 PM 03/12/2023    9:26 AM 03/08/2023    2:24 PM 12/12/2022    1:00 PM 10/09/2022   10:22 AM 06/06/2022    9:07 AM  Advanced Directives  Does Patient Have a Medical Advance Directive? No No No No No No No  Would patient like information on creating a medical advance directive? No -  Patient declined No - Patient declined No - Patient declined   Yes (MAU/Ambulatory/Procedural Areas - Information given) No - Patient declined    Current Medications (verified) Outpatient Encounter Medications as of 06/12/2023  Medication Sig   acetaminophen  (TYLENOL ) 650 MG CR tablet Take 650 mg by mouth every 8 (eight) hours as needed for pain.   albuterol  (VENTOLIN  HFA) 108 (90 Base) MCG/ACT inhaler INHALE 2 PUFFS BY MOUTH EVERY 6 HOURS AS NEEDED   apixaban  (ELIQUIS ) 5 MG TABS tablet Take 1 tablet (5 mg total) by mouth 2 (two) times daily.   Ascorbic Acid  (VITAMIN C ) 1000 MG tablet Take 1,000 mg by mouth daily.   Blood Glucose Monitoring Suppl (ONETOUCH VERIO) w/Device KIT Use to check blood sugar 3 times a day and document results, bring to appointments.  Goal is <130 fasting blood sugar and <180 two hours after meals.   calcium -vitamin D  (OSCAL WITH D) 500-5 MG-MCG tablet Take 1 tablet by mouth daily with breakfast.   Cranberry 500 MG CAPS Take 500 mg by mouth daily.   gabapentin  (NEURONTIN ) 600 MG tablet TAKE 1 TABLET BY MOUTH EVERYDAY AT BEDTIME   glucose blood test strip Use to check blood sugar 3 times a day and document results, bring to appointments.  Goal is <130 fasting blood sugar and <180 two hours after meals.   HYDROcodone -acetaminophen  (NORCO/VICODIN) 5-325 MG tablet Take 1 tablet by mouth 2 (two) times daily.   Lancets (ONETOUCH ULTRASOFT) lancets Use to check blood sugar 3 times a day and document results, bring to appointments.  Goal is <130  fasting blood sugar and <180 two hours after meals.   LORazepam  (ATIVAN ) 1 MG tablet TAKE 1 TABLET BY MOUTH EVERYDAY AT BEDTIME   Magnesium  400 MG TABS Take 400 mg by mouth daily.    MELATONIN PO Take 10 mg by mouth at bedtime.   midodrine  (PROAMATINE ) 5 MG tablet Take 10 mg in the morning, take 5 mg in the afternoon, and 5 mg in the evening   Multiple Vitamin (MULTIVITAMIN WITH MINERALS) TABS tablet Take 1 tablet by mouth daily. Centrum  Silver   NON FORMULARY Pt uses a cpap nightly   nystatin  cream (MYCOSTATIN ) Apply 1 Application topically 2 (two) times daily.   Omega-3 Fatty Acids (FISH OIL) 1200 MG CAPS Take 1,200 mg by mouth daily.   oxymetazoline  (AFRIN) 0.05 % nasal spray Place 2 sprays into both nostrils at bedtime.   pantoprazole  (PROTONIX ) 20 MG tablet TAKE 1 TABLET (20 MG TOTAL) BY MOUTH 2 (TWO) TIMES DAILY BEFORE A MEAL. (Patient taking differently: Take 20 mg by mouth daily.)   potassium chloride  SA (KLOR-CON  M) 20 MEQ tablet Take 2 tablets (40 meq) with Torsemide  (Patient taking differently: Take 40 mEq by mouth daily as needed (when taking torsemide ).)   QUEtiapine  Fumarate (SEROQUEL  XR) 150 MG 24 hr tablet Take 1 tablet (150 mg total) by mouth at bedtime.   rOPINIRole  (REQUIP ) 0.25 MG tablet Take 0.25 mg by mouth 4 (four) times daily.   rosuvastatin  (CRESTOR ) 40 MG tablet Take 1 tablet (40 mg total) by mouth daily.   solifenacin  (VESICARE ) 10 MG tablet TAKE 1 TABLET BY MOUTH EVERY DAY   tiZANidine  (ZANAFLEX ) 4 MG tablet Take 1 tablet (4 mg total) by mouth every 6 (six) hours as needed for muscle spasms.   torsemide  (DEMADEX ) 20 MG tablet TAKE 2 TABLETS (40 MG TOTAL) BY MOUTH DAILY AS NEEDED (FLUID RETENTION).   trimethoprim  (TRIMPEX ) 100 MG tablet Take 1 tablet (100 mg total) by mouth daily.   venlafaxine  XR (EFFEXOR -XR) 75 MG 24 hr capsule Take 3 capsules (225 mg total) by mouth daily.   No facility-administered encounter medications on file as of 06/12/2023.    Allergies (verified) Patient has no known allergies.   History: Past Medical History:  Diagnosis Date   Abdominal pain, chronic, right lower quadrant 12/13/2020   Anterior urethral stricture    Anxiety    Arthritis    a. knees, hips, hands;  b. 11/2013 s/p L TKA @ [facility].   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 Dr  Rodolfo Clan   Depression    Diabetes mellitus without complication (HCC)    DVT (deep venous thrombosis) (HCC)    Erosive esophagitis    GERD (gastroesophageal reflux disease)    Gross hematuria    Hyperlipemia    Hypertension    borderline   Internal hemorrhoids    Phimosis    Pulmonary embolism (HCC)    Past Surgical History:  Procedure Laterality Date   ATRIAL FIBRILLATION ABLATION N/A 05/17/2020   Procedure: ATRIAL FIBRILLATION ABLATION;  Surgeon: Boyce Byes, MD;  Location: [facility] INVASIVE CV LAB;  Service: Cardiovascular;  Laterality: N/A;   BUNIONECTOMY Bilateral 01/06/2015   Procedure: Tillman Folks;  Surgeon: Marlynn Singer, MD;  Location: [facility] ORS;  Service: Orthopedics;  Laterality: Bilateral;   CARDIAC CATHETERIZATION  ~ 2005   "once"   CATARACT  EXTRACTION W/ INTRAOCULAR LENS  IMPLANT, BILATERAL Bilateral ~ 2010   COLONOSCOPY WITH PROPOFOL  N/A 12/07/2015   Procedure: COLONOSCOPY WITH PROPOFOL ;  Surgeon: Deveron Fly, MD;  Location: [facility] ENDOSCOPY;  Service: Endoscopy;  Laterality: N/A;   COLONOSCOPY WITH PROPOFOL  N/A 11/26/2020   Procedure: COLONOSCOPY WITH PROPOFOL ;  Surgeon: Selena Daily, MD;  Location: [facility] ENDOSCOPY;  Service: Gastroenterology;  Laterality: N/A;   EP IMPLANTABLE DEVICE N/A 02/16/2016   MDT dual chamber (His Bundle) pacemaker implanted by Dr Rodolfo Clan for intermittent complete heart block   ESOPHAGOGASTRODUODENOSCOPY (EGD) WITH PROPOFOL  N/A 12/07/2015   Procedure: ESOPHAGOGASTRODUODENOSCOPY (EGD) WITH PROPOFOL ;  Surgeon: Deveron Fly, MD;  Location: [facility] ENDOSCOPY;  Service: Endoscopy;  Laterality: N/A;   ESOPHAGOGASTRODUODENOSCOPY (EGD) WITH PROPOFOL  N/A 05/17/2017   Procedure: ESOPHAGOGASTRODUODENOSCOPY (EGD) WITH PROPOFOL ;  Surgeon: Deveron Fly, MD;  Location: [facility] ENDOSCOPY;  Service: Endoscopy;  Laterality: N/A;   HAMMER TOE SURGERY Bilateral 01/06/2015   Procedure: HAMMER TOE CORRECTION;  Surgeon: Marlynn Singer, MD;  Location:  [facility] ORS;  Service: Orthopedics;  Laterality: Bilateral;   KNEE CARTILAGE SURGERY Left 1965   "football injury"   LEAD EXTRACTION N/A 03/12/2023   Procedure: LEAD EXTRACTION;  Surgeon: Boyce Byes, MD;  Location: [facility] INVASIVE CV LAB;  Service: Cardiovascular;  Laterality: N/A;   Left Total Knee Arthroplasty     a. 11/2013 [facility].   PACEMAKER IMPLANT  2017   PACEMAKER IMPLANT N/A 03/12/2023   Procedure: PACEMAKER IMPLANT;  Surgeon: Boyce Byes, MD;  Location: [facility] INVASIVE CV LAB;  Service: Cardiovascular;  Laterality: N/A;   PILONIDAL CYST EXCISION  1970's   TOTAL HIP ARTHROPLASTY Right 2004   TOTAL HIP ARTHROPLASTY Left 2006   TOTAL KNEE ARTHROPLASTY Right    UPPER GI ENDOSCOPY     Family History  Problem Relation Age of Onset   Alcoholism Mother        died in her 61's.   Cancer Mother    Stroke Father        deceased   Diabetes Father    Hypertension Father    Glaucoma Sister    Breast cancer Sister    Diabetes Daughter    Asthma Son    Stroke Paternal Grandmother    Prostate cancer Paternal Grandfather    Social History   Socioeconomic History   Marital status: Married    Spouse name: Luarel   Number of children: 2   Years of education: Not on file   Highest education level: 12th grade  Occupational History   Occupation: retired   Tobacco Use   Smoking status: Never    Passive exposure: Past   Smokeless tobacco: Never  Vaping Use   Vaping status: Never Used  Substance and Sexual Activity   Alcohol use: Not Currently   Drug use: Yes    Types: Other-see comments    Comment: Hydrocodone  prescibed   Sexual activity: Not Currently    Birth control/protection: Other-see comments  Other Topics Concern   Not on file  Social History Narrative   Lives in [place] with wife.  Does not routinely exercise.  Activity severely limited by bilateral knee pain.   Social Drivers of Corporate investment banker Strain: Low Risk  (06/12/2023)   Overall Financial  Resource Strain (CARDIA)    Difficulty of Paying Living Expenses: Not very hard  Food Insecurity: No Food Insecurity (06/12/2023)   Hunger Vital Sign    Worried About Running Out of Food in the Last  Year: Never true    Ran Out of Food in the Last Year: Never true  Transportation Needs: No Transportation Needs (06/12/2023)   PRAPARE - Administrator, Civil Service (Medical): No    Lack of Transportation (Non-Medical): No  Physical Activity: Insufficiently Active (06/12/2023)   Exercise Vital Sign    Days of Exercise per Week: 2 days    Minutes of Exercise per Session: 60 min  Stress: No Stress Concern Present (06/12/2023)   [employer] of Occupational Health - Occupational Stress Questionnaire    Feeling of Stress : Only a little  Recent Concern: Stress - Stress Concern Present (05/09/2023)   [employer] of Occupational Health - Occupational Stress Questionnaire    Feeling of Stress : To some extent  Social Connections: Moderately Integrated (06/12/2023)   Social Connection and Isolation Panel [NHANES]    Frequency of Communication with Friends and Family: More than three times a week    Frequency of Social Gatherings with Friends and Family: Twice a week    Attends Religious Services: Never    Database administrator or Organizations: Yes    Attends Engineer, structural: More than 4 times per year    Marital Status: Married    Tobacco Counseling Counseling given: No    Clinical Intake:  Pre-visit preparation completed: Yes  Pain : No/denies pain     BMI - recorded: 39.46 Nutritional Status: BMI > 30  Obese Nutritional Risks: None Diabetes: Yes CBG done?: No Did pt. bring in CBG monitor from home?: No  Lab Results  Component Value Date   HGBA1C 6.7 (H) 04/02/2023   HGBA1C 6.3 (H) 01/01/2023   HGBA1C 7.0 (H) 10/02/2022     How often do you need to have someone help you when you read instructions, pamphlets, or other written materials  from your doctor or pharmacy?: 1 - Never  Interpreter Needed?: No  Information entered by :: Jaunita Messier, CMA   Activities of Daily Living     06/12/2023    8:43 AM 03/12/2023    8:56 AM  In your present state of health, do you have any difficulty performing the following activities:  Hearing? 0 0  Vision? 0 0  Difficulty concentrating or making decisions? 0 0  Walking or climbing stairs? 1   Dressing or bathing? 1   Doing errands, shopping? 0   Preparing Food and eating ? N   Using the Toilet? N   In the past six months, have you accidently leaked urine? N   Do you have problems with loss of bowel control? N   Managing your Medications? N   Managing your Finances? N   Housekeeping or managing your Housekeeping? N     Patient Care Team: Cannady, Jolene T, NP as PCP - General (Nurse Practitioner) Boyce Byes, MD as PCP - Electrophysiology (Cardiology) Marlynn Singer, MD (Orthopedic Surgery) Dustin Gimenez, MD as Consulting Physician (Urology) Zula Hitch, MD as Consulting Physician (Anesthesiology) Verona Goodwill, MD as Consulting Physician (Cardiology) Roxie Cord, RN as [facility] (Optometry) Waldwick, Kaitlin T, PA-C (Neurology) Rosan Comfort, MD as Consulting Physician (Neurology) Erskin Hearing, MD as Consulting Physician (Pulmonary Disease)  Indicate any recent Medical Services you may have received from other than [facility] providers in the past year (date may be approximate).     Assessment:    This is a routine wellness examination for [facility].  Hearing/Vision screen Hearing  Screening - Comments:: Denies hearing loss Vision Screening - Comments:: Gets DM eye exams, [facility]   Goals Addressed             This Visit's Progress    Patient Stated       Get energy back after having cardiac procedures       Depression Screen     06/12/2023    8:52 AM 05/09/2023    2:30 PM 02/01/2023     9:50 AM 01/01/2023    9:00 AM 12/12/2022    1:00 PM 11/30/2022    9:39 AM 11/09/2022    9:31 AM  PHQ 2/9 Scores  PHQ - 2 Score 0 1 0  0 0 1  PHQ- 9 Score 1  2   1 5   Exception Documentation    Patient refusal       Fall Risk     06/12/2023    8:58 AM 05/09/2023    2:45 PM 12/12/2022   12:59 PM 10/26/2022    3:58 PM 10/09/2022   10:18 AM  Fall Risk   Falls in the past year? 0 0 1 0 0  Number falls in past yr: 0 0 0 0 0  Injury with Fall? 0 0 0 0 0  Risk for fall due to : No Fall Risks Medication side effect;Other (Comment)  Impaired balance/gait Impaired balance/gait  Risk for fall due to: Comment  Reports significant decline in physical tolerance following cardiac lead batter change     Follow up Falls prevention discussed;Falls evaluation completed   Falls evaluation completed;Education provided;Falls prevention discussed Falls evaluation completed;Education provided;Falls prevention discussed    MEDICARE RISK AT HOME:  Medicare Risk at Home Any stairs in or around the home?: Yes If so, are there any without handrails?: No Home free of loose throw rugs in walkways, pet beds, electrical cords, etc?: Yes Adequate lighting in your home to reduce risk of falls?: Yes Life alert?: No Use of a cane, walker or w/c?: No Grab bars in the bathroom?: Yes Shower chair or bench in shower?: Yes Elevated toilet seat or a handicapped toilet?: Yes  TIMED UP AND GO:  Was the test performed?  No  Cognitive Function: 6CIT completed        06/12/2023    8:59 AM 06/06/2022    9:13 AM 05/02/2021    8:36 AM 04/30/2020    8:27 AM 11/10/2019    8:09 AM  6CIT Screen  What Year? 0 points 0 points 0 points 0 points 0 points  What month? 0 points 0 points 0 points 0 points 0 points  What time? 0 points 0 points 0 points 0 points 0 points  Count back from 20 0 points 2 points 0 points 0 points 0 points  Months in reverse 2 points 2 points 0 points 0 points 0 points  Repeat phrase 0 points 0 points 0  points 2 points 0 points  Total Score 2 points 4 points 0 points 2 points 0 points    Immunizations Immunization History  Administered Date(s) Administered   Fluad Quad(high Dose 65+) 10/22/2020   Influenza Split 11/13/2013   Influenza Whole 11/09/2015   Influenza, High Dose Seasonal PF 11/16/2015, 11/14/2016, 09/27/2017, 09/28/2018, 10/14/2019, 10/20/2021, 10/14/2022   Influenza,inj,Quad PF,6+ Mos 10/20/2014   Influenza-Unspecified 09/23/2012, 03/21/2018   Moderna Sars-Covid-2 Vaccination 03/07/2019, 04/07/2019, 12/01/2019   Pfizer(Comirnaty)Fall Seasonal Vaccine 12 years and older 10/20/2021, 10/14/2022   Pneumococcal Conjugate-13 04/28/2014, 09/28/2018  Pneumococcal Polysaccharide-23 04/10/2013, 10/14/2019   Pneumococcal-Unspecified 10/01/2008   Rsv, Bivalent, Protein Subunit Rsvpref,pf Pattricia Bores) 10/14/2022   Td 01/05/2005   Td (Adult), 2 Lf Tetanus Toxid, Preservative Free 01/05/2005   Tdap 06/11/2017   Zoster Recombinant(Shingrix) 09/09/2021, 01/25/2022    Screening Tests Health Maintenance  Topic Date Due   COVID-19 Vaccine (6 - Moderna risk 2024-25 season) 04/13/2023   FOOT EXAM  05/30/2023   INFLUENZA VACCINE  08/24/2023   HEMOGLOBIN A1C  10/03/2023   OPHTHALMOLOGY EXAM  03/28/2024   Diabetic kidney evaluation - eGFR measurement  04/01/2024   Diabetic kidney evaluation - Urine ACR  04/01/2024   Medicare Annual Wellness (AWV)  06/11/2024   DTaP/Tdap/Td (3 - Td or Tdap) 06/12/2027   Pneumonia Vaccine 75+ Years old  Completed   Hepatitis C Screening  Completed   Zoster Vaccines- Shingrix  Completed   HPV VACCINES  Aged Out   Meningococcal B Vaccine  Aged Out   Colonoscopy  Discontinued    Health Maintenance  Health Maintenance Due  Topic Date Due   COVID-19 Vaccine (6 - Moderna risk 2024-25 season) 04/13/2023   FOOT EXAM  05/30/2023   Health Maintenance Items Addressed: See Nurse Notes  Additional Screening:  Vision Screening: Recommended annual  ophthalmology exams for early detection of glaucoma and other disorders of the eye.  Dental Screening: Recommended annual dental exams for proper oral hygiene  Community Resource Referral / Chronic Care Management: CRR required this visit?  No   CCM required this visit?  No   Plan:    I have personally reviewed and noted the following in the patient's chart:   Medical and social history Use of alcohol, tobacco or illicit drugs  Current medications and supplements including opioid prescriptions. Patient is currently taking opioid prescription. Functional ability and status Nutritional status Physical activity Advanced directives List of other physicians Hospitalizations, surgeries, and ER visits in previous 12 months Vitals Screenings to include cognitive, depression, and falls Referrals and appointments  In addition, I have reviewed and discussed with patient certain preventive protocols, quality metrics, and best practice recommendations. A written personalized care plan for preventive services as well as general preventive health recommendations were provided to patient.   Jaunita Messier, CMA   06/12/2023   After Visit Summary: (MyChart) Due to this being a telephonic visit, the after visit summary with patients personalized plan was offered to patient via MyChart   Notes: Please refer to Routing Comments.

## 2023-06-12 NOTE — Patient Instructions (Signed)
 Nicholas [name] , Thank you for taking time out of your busy schedule to complete your Annual Wellness Visit with me. I enjoyed our conversation and look forward to speaking with you again next year. I, as well as your care team,  appreciate your ongoing commitment to your health goals. Please review the following plan we discussed and let me know if I can assist you in the future. Your Game plan/ To Do List    Referrals: None  Follow up Visits: Next Medicare AWV with our clinical staff: 06/17/24 @ 8:40am (phone visit)   Have you seen your provider in the last 6 months (3 months if uncontrolled diabetes)? Yes Next Office Visit with your provider: 07/05/23 @ 8:00am with Jolene Cannady, NP  Clinician Recommendations:  Aim for 30 minutes of exercise or brisk walking, 6-8 glasses of water, and 5 servings of fruits and vegetables each day. You are due for a diabetic foot exam. This can be done at your next office visit.      This is a list of the screening recommended for you and due dates:  Health Maintenance  Topic Date Due   COVID-19 Vaccine (6 - Moderna risk 2024-25 season) 04/13/2023   Complete foot exam   05/30/2023   Flu Shot  08/24/2023   Hemoglobin A1C  10/03/2023   Eye exam for diabetics  03/28/2024   Yearly kidney function blood test for diabetes  04/01/2024   Yearly kidney health urinalysis for diabetes  04/01/2024   Medicare Annual Wellness Visit  06/11/2024   DTaP/Tdap/Td vaccine (3 - Td or Tdap) 06/12/2027   Pneumonia Vaccine  Completed   Hepatitis C Screening  Completed   Zoster (Shingles) Vaccine  Completed   HPV Vaccine  Aged Out   Meningitis B Vaccine  Aged Out   Colon Cancer Screening  Discontinued    Advanced directives: (Declined) Advance directive discussed with you today. Even though you declined this today, please call our office should you change your mind, and we can give you the proper paperwork for you to fill out. Advance Care Planning is important because  it:  [x]  Makes sure you receive the medical care that is consistent with your values, goals, and preferences  [x]  It provides guidance to your family and loved ones and reduces their decisional burden about whether or not they are making the right decisions based on your wishes.  Follow the link provided in your after visit summary or read over the paperwork we have mailed to you to help you started getting your Advance Directives in place. If you need assistance in completing these, please reach out to us  so that we can help you!  See attachments for Preventive Care and Fall Prevention Tips.   Fall Prevention in the Home, Adult Falls can cause injuries and affect people of all ages. There are many simple things that you can do to make your home safe and to help prevent falls. If you need it, ask for help making these changes. What actions can I take to prevent falls? General information Use good lighting in all rooms. Make sure to: Replace any light bulbs that burn out. Turn on lights if it is dark and use night-lights. Keep items that you use often in easy-to-reach places. Lower the shelves around your home if needed. Move furniture so that there are clear paths around it. Do not keep throw rugs or other things on the floor that can make you trip. If any of your  floors are uneven, fix them. Add color or contrast paint or tape to clearly mark and help you see: Grab bars or handrails. First and last steps of staircases. Where the edge of each step is. If you use a ladder or stepladder: Make sure that it is fully opened. Do not climb a closed ladder. Make sure the sides of the ladder are locked in place. Have someone hold the ladder while you use it. Know where your pets are as you move through your home. What can I do in the bathroom?     Keep the floor dry. Clean up any water that is on the floor right away. Remove soap buildup in the bathtub or shower. Buildup makes bathtubs and  showers slippery. Use non-skid mats or decals on the floor of the bathtub or shower. Attach bath mats securely with double-sided, non-slip rug tape. If you need to sit down while you are in the shower, use a non-slip stool. Install grab bars by the toilet and in the bathtub and shower. Do not use towel bars as grab bars. What can I do in the bedroom? Make sure that you have a light by your bed that is easy to reach. Do not use any sheets or blankets on your bed that hang to the floor. Have a firm bench or chair with side arms that you can use for support when you get dressed. What can I do in the kitchen? Clean up any spills right away. If you need to reach something above you, use a sturdy step stool that has a grab bar. Keep electrical cables out of the way. Do not use floor polish or wax that makes floors slippery. What can I do with my stairs? Do not leave anything on the stairs. Make sure that you have a light switch at the top and the bottom of the stairs. Have them installed if you do not have them. Make sure that there are handrails on both sides of the stairs. Fix handrails that are broken or loose. Make sure that handrails are as long as the staircases. Install non-slip stair treads on all stairs in your home if they do not have carpet. Avoid having throw rugs at the top or bottom of stairs, or secure the rugs with carpet tape to prevent them from moving. Choose a carpet design that does not hide the edge of steps on the stairs. Make sure that carpet is firmly attached to the stairs. Fix any carpet that is loose or worn. What can I do on the outside of my home? Use bright outdoor lighting. Repair the edges of walkways and driveways and fix any cracks. Clear paths of anything that can make you trip, such as tools or rocks. Add color or contrast paint or tape to clearly mark and help you see high doorway thresholds. Trim any bushes or trees on the main path into your home. Check  that handrails are securely fastened and in good repair. Both sides of all steps should have handrails. Install guardrails along the edges of any raised decks or porches. Have leaves, snow, and ice cleared regularly. Use sand, salt, or ice melt on walkways during winter months if you live where there is ice and snow. In the garage, clean up any spills right away, including grease or oil spills. What other actions can I take? Review your medicines with your health care provider. Some medicines can make you confused or feel dizzy. This can increase your chance of  falling. Wear closed-toe shoes that fit well and support your feet. Wear shoes that have rubber soles and low heels. Use a cane, walker, scooter, or crutches that help you move around if needed. Talk with your provider about other ways that you can decrease your risk of falls. This may include seeing a physical therapist to learn to do exercises to improve movement and strength. Where to find more information Centers for Disease Control and Prevention, [employer]: TonerPromos.no [employer] on Aging: BaseRingTones.pl National Institute on Aging: BaseRingTones.pl Contact a health care provider if: You are afraid of falling at home. You feel weak, drowsy, or dizzy at home. You fall at home. Get help right away if you: Lose consciousness or have trouble moving after a fall. Have a fall that causes a head injury. These symptoms may be an emergency. Get help right away. Call 911. Do not wait to see if the symptoms will go away. Do not drive yourself to the hospital. This information is not intended to replace advice given to you by your health care provider. Make sure you discuss any questions you have with your health care provider. Document Revised: 09/12/2021 Document Reviewed: 09/12/2021 Elsevier Patient Education  2024 Elsevier Inc.  Managing Pain Without Opioids Opioids are strong medicines used to treat moderate to severe pain. For some  people, especially those who have long-term (chronic) pain, opioids may not be the best choice for pain management due to: Side effects like nausea, constipation, and sleepiness. The risk of addiction (opioid use disorder). The longer you take opioids, the greater your risk of addiction. Pain that lasts for more than 3 months is called chronic pain. Managing chronic pain usually requires more than one approach and is often provided by a team of health care providers working together (multidisciplinary approach). Pain management may be done at a pain management center or pain clinic. How to manage pain without the use of opioids Use non-opioid medicines Non-opioid medicines for pain may include: Over-the-counter or prescription non-steroidal anti-inflammatory drugs (NSAIDs). These may be the first medicines used for pain. They work well for muscle and bone pain, and they reduce swelling. Acetaminophen . This over-the-counter medicine may work well for milder pain but not swelling. Antidepressants. These may be used to treat chronic pain. A certain type of antidepressant (tricyclics) is often used. These medicines are given in lower doses for pain than when used for depression. Anticonvulsants. These are usually used to treat seizures but may also reduce nerve (neuropathic) pain. Muscle relaxants. These relieve pain caused by sudden muscle tightening (spasms). You may also use a pain medicine that is applied to the skin as a patch, cream, or gel (topical analgesic), such as a numbing medicine. These may cause fewer side effects than medicines taken by mouth. Do certain therapies as directed Some therapies can help with pain management. They include: Physical therapy. You will do exercises to gain strength and flexibility. A physical therapist may teach you exercises to move and stretch parts of your body that are weak, stiff, or painful. You can learn these exercises at physical therapy visits and  practice them at home. Physical therapy may also involve: Massage. Heat wraps or applying heat or cold to affected areas. Electrical signals that interrupt pain signals (transcutaneous electrical nerve stimulation, TENS). Weak lasers that reduce pain and swelling (low-level laser therapy). Signals from your body that help you learn to regulate pain (biofeedback). Occupational therapy. This helps you to learn ways to function at home and work  with less pain. Recreational therapy. This involves trying new activities or hobbies, such as a physical activity or drawing. Mental health therapy, including: Cognitive behavioral therapy (CBT). This helps you learn coping skills for dealing with pain. Acceptance and commitment therapy (ACT) to change the way you think and react to pain. Relaxation therapies, including muscle relaxation exercises and mindfulness-based stress reduction. Pain management counseling. This may be individual, family, or group counseling.  Receive medical treatments Medical treatments for pain management include: Nerve block injections. These may include a pain blocker and anti-inflammatory medicines. You may have injections: Near the spine to relieve chronic back or neck pain. Into joints to relieve back or joint pain. Into nerve areas that supply a painful area to relieve body pain. Into muscles (trigger point injections) to relieve some painful muscle conditions. A medical device placed near your spine to help block pain signals and relieve nerve pain or chronic back pain (spinal cord stimulation device). Acupuncture. Follow these instructions at home Medicines Take over-the-counter and prescription medicines only as told by your health care provider. If you are taking pain medicine, ask your health care providers about possible side effects to watch out for. Do not drive or use heavy machinery while taking prescription opioid pain medicine. Lifestyle  Do not use drugs  or alcohol to reduce pain. If you drink alcohol, limit how much you have to: 0-1 drink a day for women who are not pregnant. 0-2 drinks a day for men. Know how much alcohol is in a drink. In the [place] one drink equals one 12 oz bottle of beer (355 mL), one 5 oz glass of wine (148 mL), or one 1 oz glass of hard liquor (44 mL). Do not use any products that contain nicotine or tobacco. These products include cigarettes, chewing tobacco, and vaping devices, such as e-cigarettes. If you need help quitting, ask your health care provider. Eat a healthy diet and maintain a healthy weight. Poor diet and excess weight may make pain worse. Eat foods that are high in fiber. These include fresh fruits and vegetables, whole grains, and beans. Limit foods that are high in fat and processed sugars, such as fried and sweet foods. Exercise regularly. Exercise lowers stress and may help relieve pain. Ask your health care provider what activities and exercises are safe for you. If your health care provider approves, join an exercise class that combines movement and stress reduction. Examples include yoga and tai chi. Get enough sleep. Lack of sleep may make pain worse. Lower stress as much as possible. Practice stress reduction techniques as told by your therapist. General instructions Work with all your pain management providers to find the treatments that work best for you. You are an important member of your pain management team. There are many things you can do to reduce pain on your own. Consider joining an online or in-person support group for people who have chronic pain. Keep all follow-up visits. This is important. Where to find more information You can find more information about managing pain without opioids from: American Academy of Pain Medicine: painmed.org Institute for Chronic Pain: instituteforchronicpain.org American Chronic Pain Association: theacpa.org Contact a health care provider if: You  have side effects from pain medicine. Your pain gets worse or does not get better with treatments or home therapy. You are struggling with anxiety or depression. Summary Many types of pain can be managed without opioids. Chronic pain may respond better to pain management without opioids. Pain is best managed  when you and a team of health care providers work together. Pain management without opioids may include non-opioid medicines, medical treatments, physical therapy, mental health therapy, and lifestyle changes. Tell your health care providers if your pain gets worse or is not being managed well enough. This information is not intended to replace advice given to you by your health care provider. Make sure you discuss any questions you have with your health care provider. Document Revised: 04/21/2020 Document Reviewed: 04/21/2020 Elsevier Patient Education  2024 [employer].

## 2023-06-13 ENCOUNTER — Ambulatory Visit (INDEPENDENT_AMBULATORY_CARE_PROVIDER_SITE_OTHER): Payer: Medicare Other

## 2023-06-13 ENCOUNTER — Other Ambulatory Visit: Payer: Self-pay | Admitting: Physician Assistant

## 2023-06-13 ENCOUNTER — Encounter: Payer: Self-pay | Admitting: Cardiology

## 2023-06-13 ENCOUNTER — Telehealth: Payer: Self-pay

## 2023-06-13 ENCOUNTER — Ambulatory Visit: Payer: Medicare Other | Attending: Cardiology | Admitting: Cardiology

## 2023-06-13 VITALS — BP 138/86 | HR 86 | Ht 70.0 in | Wt 277.6 lb

## 2023-06-13 DIAGNOSIS — Z950 Presence of cardiac pacemaker: Secondary | ICD-10-CM

## 2023-06-13 DIAGNOSIS — N390 Urinary tract infection, site not specified: Secondary | ICD-10-CM

## 2023-06-13 DIAGNOSIS — D6869 Other thrombophilia: Secondary | ICD-10-CM | POA: Diagnosis not present

## 2023-06-13 DIAGNOSIS — I4819 Other persistent atrial fibrillation: Secondary | ICD-10-CM

## 2023-06-13 DIAGNOSIS — I495 Sick sinus syndrome: Secondary | ICD-10-CM | POA: Diagnosis not present

## 2023-06-13 LAB — CUP PACEART INCLINIC DEVICE CHECK
Date Time Interrogation Session: 20250521125125
Implantable Lead Connection Status: 753985
Implantable Lead Connection Status: 753985
Implantable Lead Implant Date: 20180124
Implantable Lead Implant Date: 20250217
Implantable Lead Location: 753859
Implantable Lead Location: 753860
Implantable Lead Model: 3830
Implantable Lead Model: 5076
Implantable Pulse Generator Implant Date: 20250217

## 2023-06-13 LAB — CUP PACEART REMOTE DEVICE CHECK
Battery Remaining Longevity: 150 mo
Battery Voltage: 3.21 V
Brady Statistic AP VP Percent: 49.67 %
Brady Statistic AP VS Percent: 0 %
Brady Statistic AS VP Percent: 50.32 %
Brady Statistic AS VS Percent: 0.01 %
Brady Statistic RA Percent Paced: 49.56 %
Brady Statistic RV Percent Paced: 99.99 %
Date Time Interrogation Session: 20250521061110
Implantable Lead Connection Status: 753985
Implantable Lead Connection Status: 753985
Implantable Lead Implant Date: 20180124
Implantable Lead Implant Date: 20250217
Implantable Lead Location: 753859
Implantable Lead Location: 753860
Implantable Lead Model: 3830
Implantable Lead Model: 5076
Implantable Pulse Generator Implant Date: 20250217
Lead Channel Impedance Value: 304 Ohm
Lead Channel Impedance Value: 361 Ohm
Lead Channel Impedance Value: 437 Ohm
Lead Channel Impedance Value: 665 Ohm
Lead Channel Pacing Threshold Amplitude: 0.5 V
Lead Channel Pacing Threshold Amplitude: 0.625 V
Lead Channel Pacing Threshold Pulse Width: 0.4 ms
Lead Channel Pacing Threshold Pulse Width: 0.4 ms
Lead Channel Sensing Intrinsic Amplitude: 26.25 mV
Lead Channel Sensing Intrinsic Amplitude: 26.25 mV
Lead Channel Sensing Intrinsic Amplitude: 3 mV
Lead Channel Sensing Intrinsic Amplitude: 3 mV
Lead Channel Setting Pacing Amplitude: 1.5 V
Lead Channel Setting Pacing Amplitude: 2 V
Lead Channel Setting Pacing Pulse Width: 0.4 ms
Lead Channel Setting Sensing Sensitivity: 0.9 mV
Zone Setting Status: 755011
Zone Setting Status: 755011

## 2023-06-13 NOTE — Telephone Encounter (Signed)
 Patient came in to make follow up VV, there are no available appointments until 07/09/23. Patient will run out of medication before appointment. He states that he will run out around 06/26/23.

## 2023-06-13 NOTE — Patient Instructions (Signed)
 Medication Instructions:  The current medical regimen is effective;  continue present plan and medications as directed. Please refer to the Current Medication list given to you today.   *If you need a refill on your cardiac medications before your next appointment, please call your pharmacy*  Follow-Up: At [facility], you and your health needs are our priority.  As part of our continuing mission to provide you with exceptional heart care, our providers are all part of one team.  This team includes your primary Cardiologist (physician) and Advanced Practice Providers or APPs (Physician Assistants and Nurse Practitioners) who all work together to provide you with the care you need, when you need it.  Your next appointment:   6 month(s)  Provider:   Harvie Liner, MD or Suzann Riddle, NP    We recommend signing up for the patient portal called "MyChart".  Sign up information is provided on this After Visit Summary.  MyChart is used to connect with patients for Virtual Visits (Telemedicine).  Patients are able to view lab/test results, encounter notes, upcoming appointments, etc.  Non-urgent messages can be sent to your provider as well.   To learn more about what you can do with MyChart, go to [email or web address].   Other Instructions Please keep blood pressure and heart rate log at home and let us  know of any issues.

## 2023-06-13 NOTE — Telephone Encounter (Signed)
 Add him to today for Dr Welton Hall.

## 2023-06-20 ENCOUNTER — Ambulatory Visit: Payer: Self-pay | Admitting: Cardiology

## 2023-06-20 ENCOUNTER — Ambulatory Visit: Payer: Self-pay | Admitting: Cardiovascular Disease

## 2023-06-22 ENCOUNTER — Other Ambulatory Visit: Payer: Self-pay

## 2023-06-22 NOTE — Patient Outreach (Signed)
 Complex Care Management   Visit Note  06/22/2023  Name:  Nicholas [name] MRN: [id number] DOB: 12-22-1945  Situation: Referral received for Complex Care Management related to Pulmonary Embolism, DM, HTN and HLD. I obtained verbal consent from Patient.  Visit completed with Mr. Dolph Friar and his spouse/caregiver via telephone.  Background:   Past Medical History:  Diagnosis Date   Abdominal pain, chronic, right lower quadrant 12/13/2020   Anterior urethral stricture    Anxiety    Arthritis    a. knees, hips, hands;  b. 11/2013 s/p L TKA @ [facility].   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 Dr Rodolfo Clan   Depression    Diabetes mellitus without complication (HCC)    DVT (deep venous thrombosis) (HCC)    Erosive esophagitis    GERD (gastroesophageal reflux disease)    Gross hematuria    Hyperlipemia    Hypertension    borderline   Internal hemorrhoids    Phimosis    Pulmonary embolism (HCC)       Assessment: Patient Reported Symptoms: Cognitive Cognitive Status: Alert and oriented to person, place, and time, Normal speech and language skills Cognitive/Intellectual Conditions Management [RPT]: None reported or documented in medical history or problem list Health Maintenance Behaviors: Annual physical exam Healing Pattern: Unsure Health Facilitated by: Pain control, Rest  Neurological Neurological Review of Symptoms: No symptoms reported  HEENT HEENT Symptoms Reported: No symptoms reported  Cardiovascular Cardiovascular Symptoms Reported: No symptoms reported  Respiratory Respiratory Symptoms Reported: No symptoms reported  Endocrine Patient reports the following symptoms related to hypoglycemia or hyperglycemia : No symptoms reported  Gastrointestinal Gastrointestinal Symptoms Reported: No symptoms reported  Genitourinary Genitourinary Symptoms Reported:  No symptoms reported  Integumentary Integumentary Symptoms Reported: No symptoms reported  Musculoskeletal Musculoskelatal Symptoms Reviewed: Other Other Musculoskeletal Symptoms: Reports chronic joint pain currently controlled with pain medication. Reports he will require outreach with Pain Management before medications can be refilled, however the Pain Management team would not be able to accomodate him until July 09, 2023. Notes he will be out of medication by this time. Will follow up with the Pain Management team to determine if short script can be ordered to ensure patient does not run out of pain medications.  Psychosocial Psychosocial Symptoms Reported: No symptoms reported Quality of Family Relationships: supportive Do you feel physically threatened by others?: No      06/22/2023    9:45 AM  Depression screen PHQ 2/9  Decreased Interest 0  Down, Depressed, Hopeless 0  PHQ - 2 Score 0    There were no vitals filed for this visit.  Medications Reviewed Today     Reviewed by Roxie Cord, RN (Registered Nurse) on 06/22/23 at [phone]  Med List Status: <None>   Medication Order Taking? Sig Documenting Provider Last Dose Status Informant  acetaminophen  (TYLENOL ) 650 MG CR tablet [id number] No Take 650 mg by mouth every 8 (eight) hours as needed for pain. [provider] Taking Active Spouse/Significant Other  albuterol  (VENTOLIN  HFA) 108 (90 Base) MCG/ACT inhaler [id number] No INHALE 2 PUFFS BY MOUTH EVERY 6 HOURS AS NEEDED Cannady, Jolene T, NP Taking Active Spouse/Significant Other           Med Note Dessa Floss, Shenicka Sunderlin N   Fri Jun 22, 2023  9:32 AM) Reports not using often  apixaban  (ELIQUIS )  5 MG TABS tablet [id number] No Take 1 tablet (5 mg total) by mouth 2 (two) times daily. Verona Goodwill, MD Taking Active   Ascorbic Acid  (VITAMIN C ) 1000 MG tablet [id number] No Take 1,000 mg by mouth daily. [provider] Taking Active Spouse/Significant Other  Blood Glucose  Monitoring Suppl (ONETOUCH VERIO) w/Device KIT [id number] No Use to check blood sugar 3 times a day and document results, bring to appointments.  Goal is <130 fasting blood sugar and <180 two hours after meals. Doran Galloway T, NP Taking Active Spouse/Significant Other  calcium -vitamin D  (OSCAL WITH D) 500-5 MG-MCG tablet [id number] No Take 1 tablet by mouth daily with breakfast. [provider] Taking Active Spouse/Significant Other  Cranberry 500 MG CAPS [id number] No Take 500 mg by mouth daily. [provider] Taking Active Spouse/Significant Other  gabapentin  (NEURONTIN ) 600 MG tablet [id number] No TAKE 1 TABLET BY MOUTH EVERYDAY AT BEDTIME Cannady, Jolene T, NP Taking Active Spouse/Significant Other  glucose blood test strip [id number] No Use to check blood sugar 3 times a day and document results, bring to appointments.  Goal is <130 fasting blood sugar and <180 two hours after meals. Doran Galloway T, NP Taking Active Spouse/Significant Other  HYDROcodone -acetaminophen  (NORCO/VICODIN) 5-325 MG tablet [id number] No Take 1 tablet by mouth 2 (two) times daily. Zula Hitch, MD Taking Active   Lancets [facility] ULTRASOFT) lancets [id number] No Use to check blood sugar 3 times a day and document results, bring to appointments.  Goal is <130 fasting blood sugar and <180 two hours after meals. Doran Galloway T, NP Taking Active Spouse/Significant Other  LORazepam  (ATIVAN ) 1 MG tablet [id number] No TAKE 1 TABLET BY MOUTH EVERYDAY AT BEDTIME Cannady, Jolene T, NP Taking Active   Magnesium  400 MG TABS [id number] No Take 400 mg by mouth daily.  [provider] Taking Active Spouse/Significant Other  MELATONIN PO [id number] No Take 10 mg by mouth at bedtime. [provider] Taking Active Spouse/Significant Other  midodrine  (PROAMATINE ) 5 MG tablet [id number] No Take 10 mg in the morning, take 5 mg in the afternoon, and 5 mg in the evening Verona Goodwill, MD Taking Active    Multiple Vitamin (MULTIVITAMIN WITH MINERALS) TABS tablet [id number] No Take 1 tablet by mouth daily. Centrum Silver [provider] Taking Active Spouse/Significant Other  NON FORMULARY [id number] No Pt uses a cpap nightly [provider] Taking Active Spouse/Significant Other  nystatin  cream (MYCOSTATIN ) [id number] No Apply 1 Application topically 2 (two) times daily. Solomon Dupre, DO Taking Active Spouse/Significant Other  Omega-3 Fatty Acids (FISH OIL) 1200 MG CAPS [id number] No Take 1,200 mg by mouth daily. [provider] Taking Active Spouse/Significant Other  oxymetazoline  (AFRIN) 0.05 % nasal spray [id number] No Place 2 sprays into both nostrils at bedtime. [provider] Taking Active Spouse/Significant Other  pantoprazole  (PROTONIX ) 20 MG tablet [id number] No TAKE 1 TABLET (20 MG TOTAL) BY MOUTH 2 (TWO) TIMES DAILY BEFORE A MEAL.  Patient taking differently: Take 20 mg by mouth daily.   Lemar Pyles, NP Taking Active Spouse/Significant Other           Med Note Dessa Floss, Madlyn Crosby N   Fri Jun 22, 2023  9:33 AM) Reports taking daily  potassium chloride  SA (KLOR-CON  M) 20 MEQ tablet [id number] No Take 2 tablets (40 meq) with Torsemide   Patient taking differently: Take 40 mEq by mouth daily as needed (when taking torsemide ).   Lemar Pyles, NP Taking Active Spouse/Significant Other  QUEtiapine   Fumarate (SEROQUEL  XR) 150 MG 24 hr tablet [id number] No Take 1 tablet (150 mg total) by mouth at bedtime. Cannady, Jolene T, NP Taking Active   rOPINIRole  (REQUIP ) 0.25 MG tablet [id number] No Take 0.25 mg by mouth 4 (four) times daily. [provider] Taking Active Spouse/Significant Other  rosuvastatin  (CRESTOR ) 40 MG tablet [id number] No Take 1 tablet (40 mg total) by mouth daily. Doran Galloway T, NP Taking Active   solifenacin  (VESICARE ) 10 MG tablet [id number] No TAKE 1 TABLET BY MOUTH EVERY DAY Cannady, Jolene T, NP Taking Active   tiZANidine   (ZANAFLEX ) 4 MG tablet [id number] No Take 1 tablet (4 mg total) by mouth every 6 (six) hours as needed for muscle spasms. Cannady, Jolene T, NP Taking Active Spouse/Significant Other  torsemide  (DEMADEX ) 20 MG tablet [id number] No TAKE 2 TABLETS (40 MG TOTAL) BY MOUTH DAILY AS NEEDED (FLUID RETENTION). Verona Goodwill, MD Taking Active Spouse/Significant Other  trimethoprim  (TRIMPEX ) 100 MG tablet [id number] No Take 1 tablet (100 mg total) by mouth daily. Vaillancourt, Samantha, PA-C Taking Active Spouse/Significant Other  venlafaxine  XR (EFFEXOR -XR) 75 MG 24 hr capsule [id number] No Take 3 capsules (225 mg total) by mouth daily. Lemar Pyles, NP Taking Active   Med List Note [place], [place] 04/18/23 1326): UDS 12-14-22 MR 06-22-23  When searching in control substance database search exactly this: Jaryn Swingler jr.            Recommendation:   PCP Follow-up on June, 12, 2025 Continue Current Plan of Care  Follow Up Plan:   Telephone follow up appointment with Nurse Case Manager on July 19, 2023   Roxie Cord [facility] RN Care Manager Direct Dial: [phone]  Fax: [phone] Website: [email or web address]

## 2023-06-22 NOTE — Patient Instructions (Addendum)
 Thank you for allowing the Complex Care Management team to participate in your care. It was great speaking with you!  Reminders: -Please attend your PCP appointment as scheduled on July 05, 2023. -I will contact the Pain Management clinic regarding your need for an earlier appointment to avoid running out of pain medications.   We will follow up on July 19, 2023 at 0930.   Roxie Cord [facility] RN Care Manager Direct Dial: [phone]  Fax: [phone] Website: [email or web address]

## 2023-06-28 ENCOUNTER — Encounter: Payer: Self-pay | Admitting: Anesthesiology

## 2023-07-01 NOTE — Patient Instructions (Signed)
 Be Involved in Caring For Your Health:  Taking Medications When medications are taken as directed, they can greatly improve your health. But if they are not taken as prescribed, they may not work. In some cases, not taking them correctly can be harmful. To help ensure your treatment remains effective and safe, understand your medications and how to take them. Bring your medications to each visit for review by your provider.  Your lab results, notes, and after visit summary will be available on My Chart. We strongly encourage you to use this feature. If lab results are abnormal the clinic will contact you with the appropriate steps. If the clinic does not contact you assume the results are satisfactory. You can always view your results on My Chart. If you have questions regarding your health or results, please contact the clinic during office hours. You can also ask questions on My Chart.  We at [facility] are grateful that you chose [place] to provide your care. We strive to provide evidence-based and compassionate care and are always looking for feedback. If you get a survey from the clinic please complete this so we can hear your opinions.  Diabetes Mellitus and Foot Care Diabetes, also called diabetes mellitus, may cause problems with your feet and legs because of poor blood flow (circulation). Poor circulation may make your skin: Become thinner and drier. Break more easily. Heal more slowly. Peel and crack. You may also have nerve damage (neuropathy). This can cause decreased feeling in your legs and feet. This means that you may not notice minor injuries to your feet that could lead to more serious problems. Finding and treating problems early is the best way to prevent future foot problems. How to care for your feet Foot hygiene  Wash your feet daily with warm water and mild soap. Do not use hot water. Then, pat your feet and the areas between your toes until they are fully dry. Do  not soak your feet. This can dry your skin. Trim your toenails straight across. Do not dig under them or around the cuticle. File the edges of your nails with an emery board or nail file. Apply a moisturizing lotion or petroleum jelly to the skin on your feet and to dry, brittle toenails. Use lotion that does not contain alcohol and is unscented. Do not apply lotion between your toes. Shoes and socks Wear clean socks or stockings every day. Make sure they are not too tight. Do not wear knee-high stockings. These may decrease blood flow to your legs. Wear shoes that fit well and have enough cushioning. Always look in your shoes before you put them on to be sure there are no objects inside. To break in new shoes, wear them for just a few hours a day. This prevents injuries on your feet. Wounds, scrapes, corns, and calluses  Check your feet daily for blisters, cuts, bruises, sores, and redness. If you cannot see the bottom of your feet, use a mirror or ask someone for help. Do not cut off corns or calluses or try to remove them with medicine. If you find a minor scrape, cut, or break in the skin on your feet, keep it and the skin around it clean and dry. You may clean these areas with mild soap and water. Do not clean the area with peroxide, alcohol, or iodine. If you have a wound, scrape, corn, or callus on your foot, look at it several times a day to make sure it  is healing and not infected. Check for: Redness, swelling, or pain. Fluid or blood. Warmth. Pus or a bad smell. General tips Do not cross your legs. This may decrease blood flow to your feet. Do not use heating pads or hot water bottles on your feet. They may burn your skin. If you have lost feeling in your feet or legs, you may not know this is happening until it is too late. Protect your feet from hot and cold by wearing shoes, such as at the beach or on hot pavement. Schedule a complete foot exam at least once a year or more often if  you have foot problems. Report any cuts, sores, or bruises to your health care provider right away. Where to find more information American Diabetes Association: diabetes.org Association of Diabetes Care & Education Specialists: diabeteseducator.org Contact a health care provider if: You have a condition that increases your risk of infection, and you have any cuts, sores, or bruises on your feet. You have an injury that is not healing. You have redness on your legs or feet. You feel burning or tingling in your legs or feet. You have pain or cramps in your legs and feet. Your legs or feet are numb. Your feet always feel cold. You have pain around any toenails. Get help right away if: You have a wound, scrape, corn, or callus on your foot and: You have signs of infection. You have a fever. You have a red line going up your leg. This information is not intended to replace advice given to you by your health care provider. Make sure you discuss any questions you have with your health care provider. Document Revised: 07/13/2021 Document Reviewed: 07/13/2021 Elsevier Patient Education  2024 [employer].

## 2023-07-05 ENCOUNTER — Ambulatory Visit: Payer: Self-pay | Admitting: Nurse Practitioner

## 2023-07-05 ENCOUNTER — Ambulatory Visit: Admitting: Nurse Practitioner

## 2023-07-05 ENCOUNTER — Encounter: Payer: Self-pay | Admitting: Nurse Practitioner

## 2023-07-05 VITALS — BP 118/72 | HR 86 | Temp 98.0°F | Ht 70.0 in | Wt 284.0 lb

## 2023-07-05 DIAGNOSIS — E119 Type 2 diabetes mellitus without complications: Secondary | ICD-10-CM

## 2023-07-05 DIAGNOSIS — E1159 Type 2 diabetes mellitus with other circulatory complications: Secondary | ICD-10-CM

## 2023-07-05 DIAGNOSIS — I442 Atrioventricular block, complete: Secondary | ICD-10-CM

## 2023-07-05 DIAGNOSIS — I152 Hypertension secondary to endocrine disorders: Secondary | ICD-10-CM

## 2023-07-05 DIAGNOSIS — E1169 Type 2 diabetes mellitus with other specified complication: Secondary | ICD-10-CM | POA: Diagnosis not present

## 2023-07-05 DIAGNOSIS — G894 Chronic pain syndrome: Secondary | ICD-10-CM

## 2023-07-05 DIAGNOSIS — F339 Major depressive disorder, recurrent, unspecified: Secondary | ICD-10-CM

## 2023-07-05 DIAGNOSIS — N1831 Chronic kidney disease, stage 3a: Secondary | ICD-10-CM | POA: Diagnosis not present

## 2023-07-05 DIAGNOSIS — E1122 Type 2 diabetes mellitus with diabetic chronic kidney disease: Secondary | ICD-10-CM | POA: Diagnosis not present

## 2023-07-05 DIAGNOSIS — E669 Obesity, unspecified: Secondary | ICD-10-CM

## 2023-07-05 DIAGNOSIS — I4819 Other persistent atrial fibrillation: Secondary | ICD-10-CM

## 2023-07-05 DIAGNOSIS — R5383 Other fatigue: Secondary | ICD-10-CM

## 2023-07-05 DIAGNOSIS — I7143 Infrarenal abdominal aortic aneurysm, without rupture: Secondary | ICD-10-CM

## 2023-07-05 DIAGNOSIS — R7989 Other specified abnormal findings of blood chemistry: Secondary | ICD-10-CM

## 2023-07-05 DIAGNOSIS — D6869 Other thrombophilia: Secondary | ICD-10-CM

## 2023-07-05 DIAGNOSIS — F419 Anxiety disorder, unspecified: Secondary | ICD-10-CM

## 2023-07-05 LAB — URINALYSIS, ROUTINE W REFLEX MICROSCOPIC
Bilirubin, UA: NEGATIVE
Glucose, UA: NEGATIVE
Ketones, UA: NEGATIVE
Nitrite, UA: NEGATIVE
Protein,UA: NEGATIVE
RBC, UA: NEGATIVE
Specific Gravity, UA: 1.025 (ref 1.005–1.030)
Urobilinogen, Ur: 0.2 mg/dL (ref 0.2–1.0)
pH, UA: 6 (ref 5.0–7.5)

## 2023-07-05 LAB — MICROSCOPIC EXAMINATION
Bacteria, UA: NONE SEEN
RBC, Urine: NONE SEEN /HPF (ref 0–2)

## 2023-07-05 LAB — BAYER DCA HB A1C WAIVED: HB A1C (BAYER DCA - WAIVED): 6.8 % — ABNORMAL HIGH (ref 4.8–5.6)

## 2023-07-05 MED ORDER — PANTOPRAZOLE SODIUM 20 MG PO TBEC: 20.0000 mg | DELAYED_RELEASE_TABLET | Freq: Every day | ORAL | 2 refills | Status: AC

## 2023-07-05 MED ORDER — SOLIFENACIN SUCCINATE 10 MG PO TABS: 10.0000 mg | ORAL_TABLET | Freq: Every day | ORAL | 2 refills | Status: AC

## 2023-07-05 MED ORDER — LORAZEPAM 1 MG PO TABS: ORAL_TABLET | ORAL | 0 refills | Status: DC

## 2023-07-05 NOTE — Assessment & Plan Note (Signed)
 Chronic, ongoing.  Followed by cardiology.  Will continue this collaboration and medication as ordered by them.

## 2023-07-05 NOTE — Assessment & Plan Note (Signed)
 Chronic, ongoing.  Continue current medication regimen and adjust as needed. Lipid panel today.

## 2023-07-05 NOTE — Assessment & Plan Note (Signed)
 Stable.  His recent labs since July 2022 have been within good ranges and improved.  If ongoing stability will discontinue this diagnosis.

## 2023-07-05 NOTE — Assessment & Plan Note (Signed)
Chronic, ongoing.  Denies SI/HI.  Continue current medication regimen and adjust as needed.  Would benefit from trial off Effexor and trial of SSRI, but refuses this.   

## 2023-07-05 NOTE — Assessment & Plan Note (Signed)
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  Recommended eating smaller high protein, low fat meals more frequently and exercising 30 mins a day 5 times a week with a goal of 10-15lb weight loss in the next 3 months. Patient voiced their understanding and motivation to adhere to these recommendations.

## 2023-07-05 NOTE — Assessment & Plan Note (Signed)
Chronic, ongoing followed by pain clinic, Dr. Andree Elk.  Discussed at length risk of benzo and opioid use in conjunction with each other.  Recommend he not take the two together at same hour during day or evening.

## 2023-07-05 NOTE — Assessment & Plan Note (Signed)
 Chronic, ongoing.  Discussed at length risk of benzo and opioid use in conjunction with each other.  Recommend he not take the two together at same hour during day or evening. Recommend now that has CPAP and improved sleep trial cut back slowly on Ativan.  They request 90 day supply, as previous PCP supplied this.  Are aware he does have to return every 3 months for refills.  UDS up to date with pain management.

## 2023-07-05 NOTE — Assessment & Plan Note (Signed)
Ongoing and stable.  Followed by cardiology.  Pacemaker checks by them.

## 2023-07-05 NOTE — Assessment & Plan Note (Signed)
 Ongoing.  Noted on imaging on 11/22/19.  With recommendation to monitor annually.  Will continue statin and monitor BP control.  Last CT abdomen in March 2023 with no aneurysm reported, similar in March 2024.

## 2023-07-05 NOTE — Progress Notes (Signed)
 BP 118/72 (BP Location: Left Arm, Patient Position: Sitting, Cuff Size: Large)   Pulse 86   Temp 98 F (36.7 C) (Oral)   Ht 5' 10 (1.778 m)   Wt 284 lb (128.8 kg)   SpO2 94%   BMI 40.75 kg/m    Subjective:    Patient ID: Nicholas [name], male    DOB: 1945-02-18, 78 y.o.   MRN: [id number]  HPI: Nicholas [name] is a 78 y.o. male  Chief Complaint  Patient presents with   Diabetes   Hypertension   Hyperlipidemia   Depression   DIABETES A1c 6.7% in March. Tries to eat healthy, smaller portions but this is challenging at times.  Ozempic  caused GI symptoms, including increased heart burn.  Jardiance  caused urinary issues and frequent UTIs in past, so urology discontinued. Hypoglycemic episodes:no Polydipsia/polyuria: no Visual disturbance: no Chest pain: no Paresthesias: no Glucose Monitoring: yes  Accucheck frequency: not checking  Fasting glucose:   Post prandial:  Evening:  Before meals: Taking Insulin ?: no  Long acting insulin :  Short acting insulin : Blood Pressure Monitoring: daily Retinal Examination: Up To Date -- [place], macular degeneration noted Foot Exam: Up to Date Pneumovax: Up to Date Influenza: Up to Date Aspirin : no   HYPERTENSION / HYPERLIPIDEMIA/A-FIB Takes Rosuvastatin , Eliquis , fish oil.  Last ablation was 05/17/20. Echo 04/06/22 EF 60-65% and mild LVH.  Pacemaker placed January 2018 and lead extraction on 03/12/23. Continues on Midodrine  for hypotensive episodes. Last cardiology visit was 06/13/23.  Using CPAP every night.  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  Saw 12/18/22 last with no changes, did attend pulmonary rehab which offered benefit to SOB. Satisfied with current treatment? yes Duration of hypertension: chronic BP monitoring frequency: occasional BP range:  BP medication side effects: no Duration of hyperlipidemia: chronic Cholesterol medication side effects: no Cholesterol supplements: fish  oil Medication compliance: good compliance Aspirin : no Recent stressors: no Recurrent headaches: no Visual changes: no Palpitations: no Dyspnea: if active continues to have this, per baseline Chest pain: no Lower extremity edema: no Dizzy/lightheaded: as long as taking Midodrine  is okay  CHRONIC KIDNEY DISEASE CKD status: stable Medications renally dose: yes Previous renal evaluation: no Pneumovax:  Up to Date Influenza Vaccine:  Up to Date    Latest Ref Rng & Units 04/02/2023   10:17 AM 02/28/2023    9:16 AM 01/01/2023    9:03 AM  BMP  Glucose 70 - 99 mg/dL 409  811  914   BUN 8 - 27 mg/dL 23  24  18    Creatinine 0.76 - 1.27 mg/dL 7.82  9.56  2.13   BUN/Creat Ratio 10 - 24 21  18  13    Sodium 134 - 144 mmol/L 138  141  140   Potassium 3.5 - 5.2 mmol/L 5.0  5.0  4.7   Chloride 96 - 106 mmol/L 101  100  101   CO2 20 - 29 mmol/L 25  23  27    Calcium  8.6 - 10.2 mg/dL 9.7  9.8  9.2     DEPRESSION & CHRONIC PAIN Long term use of Effexor , Seroquel , and Ativan .  Pt and his wife at bedside made aware of risks of benzo medication use to include increased sedation, respiratory suppression, falls, extrapyramidal movements, dependence and cardiovascular events.  Pt and his wife would like to continue treatment as benefit determined to outweigh risk.  Multiple at length discussions with him that he is also on opioid  therapy.  Discussed risks with taking these three medications together at same time and recommend to separate them when taking Seroquel , Ativan , and opioid.  Veteran, has been on this regimen for years. Recent increased stressors over his son.   History. In past taking 1/2 tablet Ativan  did not work well (per wife and patient) and wishes to maintain current dosing.  Ativan  fill on PDMP review 04/15/23 and last opioid fill 05/30/23.  UDS with pain management on 12/14/22. Dr. Welton Hall with pain management last seen on 04/17/23. Duration: stable Anxious mood: little bit Excessive worrying:  not as much Irritability: no Sweating: no Nausea: no Palpitations:no Hyperventilation: no Panic attacks: no Agoraphobia: no  Obscessions/compulsions: no Depressed mood: sometimes    06/22/2023    9:45 AM 06/12/2023    8:52 AM 05/09/2023    2:30 PM 02/01/2023    9:50 AM 12/12/2022    1:00 PM  Depression screen PHQ 2/9  Decreased Interest 0 0 0 0 0  Down, Depressed, Hopeless 0 0 1 0 0  PHQ - 2 Score 0 0 1 0 0  Altered sleeping  0  1   Tired, decreased energy  1  1   Change in appetite  0  0   Feeling bad or failure about yourself   0  0   Trouble concentrating  0  0   Moving slowly or fidgety/restless  0  0   Suicidal thoughts  0  0   PHQ-9 Score  1  2   Difficult doing work/chores  Not difficult at all  Somewhat difficult        05/09/2023    2:45 PM 10/23/2022    9:11 AM 10/02/2022    9:09 AM 11/29/2021    8:53 AM  GAD 7 : Generalized Anxiety Score  Nervous, Anxious, on Edge  1 0 1  Control/stop worrying 0 2 3 2   Worry too much - different things 0 2 3 1   Trouble relaxing 1 1 0 1  Restless 0 0 0 1  Easily annoyed or irritable 0 1 0 1  Afraid - awful might happen 0 2 2 1   Total GAD 7 Score  9 8 8   Anxiety Difficulty Not difficult at all Somewhat difficult Somewhat difficult Not difficult at all   AIMS:  Facial and Oral Movements  Muscles of Facial Expression: None Lips and Perioral Area: None Jaw: None Tongue: None Extremity Movements: Upper (arms, wrists, hands, fingers): None Lower (legs, knees, ankles, toes): None Trunk Movements:  Neck, shoulders, hips: None Overall Severity : Severity of abnormal movements: None Incapacitation due to abnormal movements: None Patient's awareness of abnormal movements (rate only patient's report): none present Dental Status  Current problems with teeth and/or dentures?: No  Does patient usually wear dentures?: No   Relevant past medical, surgical, family and social history reviewed and updated as indicated. Interim medical  history since our last visit reviewed. Allergies and medications reviewed and updated.  Review of Systems  Constitutional:  Negative for activity change, appetite change, fatigue and fever.  Respiratory:  Positive for shortness of breath (occasional). Negative for cough, chest tightness and wheezing.   Cardiovascular:  Negative for chest pain, palpitations and leg swelling.  Endocrine: Negative for polydipsia, polyphagia and polyuria.  Musculoskeletal:  Positive for arthralgias.  Neurological: Negative.   Psychiatric/Behavioral:  Negative for decreased concentration, self-injury, sleep disturbance and suicidal ideas. The patient is not nervous/anxious.    Per HPI unless specifically indicated above  Objective:    BP 118/72 (BP Location: Left Arm, Patient Position: Sitting, Cuff Size: Large)   Pulse 86   Temp 98 F (36.7 C) (Oral)   Ht 5' 10 (1.778 m)   Wt 284 lb (128.8 kg)   SpO2 94%   BMI 40.75 kg/m   Wt Readings from Last 3 Encounters:  07/05/23 284 lb (128.8 kg)  06/13/23 277 lb 9.6 oz (125.9 kg)  06/12/23 275 lb (124.7 kg)    Physical Exam Vitals and nursing note reviewed.  Constitutional:      General: He is awake. He is not in acute distress.    Appearance: Normal appearance. He is well-developed and well-groomed. He is obese. He is not ill-appearing or toxic-appearing.  HENT:     Head: Normocephalic.     Right Ear: Hearing and external ear normal.     Left Ear: Hearing and external ear normal.   Eyes:     General: Lids are normal.     Extraocular Movements: Extraocular movements intact.     Conjunctiva/sclera: Conjunctivae normal.   Neck:     Thyroid : No thyromegaly.     Vascular: No carotid bruit.   Cardiovascular:     Rate and Rhythm: Normal rate and regular rhythm.     Heart sounds: Normal heart sounds. No murmur heard.    No gallop.  Pulmonary:     Effort: No accessory muscle usage or respiratory distress.     Breath sounds: Normal breath sounds.   Abdominal:     General: Bowel sounds are normal. There is no distension.     Palpations: Abdomen is soft.     Tenderness: There is no abdominal tenderness.   Musculoskeletal:     Cervical back: Full passive range of motion without pain.     Right lower leg: No edema.     Left lower leg: No edema.  Lymphadenopathy:     Cervical: No cervical adenopathy.   Skin:    General: Skin is warm.     Capillary Refill: Capillary refill takes less than 2 seconds.   Neurological:     Mental Status: He is alert and oriented to person, place, and time.     Deep Tendon Reflexes: Reflexes are normal and symmetric.     Reflex Scores:      Brachioradialis reflexes are 2+ on the right side and 2+ on the left side.      Patellar reflexes are 2+ on the right side and 2+ on the left side.  Psychiatric:        Attention and Perception: Attention normal.        Mood and Affect: Mood normal.        Speech: Speech normal.        Behavior: Behavior normal. Behavior is cooperative.        Thought Content: Thought content normal.    Diabetic Foot Exam - Simple   Simple Foot Form Visual Inspection No deformities, no ulcerations, no other skin breakdown bilaterally: Yes Sensation Testing Intact to touch and monofilament testing bilaterally: Yes Pulse Check Posterior Tibialis and Dorsalis pulse intact bilaterally: Yes Comments     Results for orders placed or performed in visit on 07/05/23  Microscopic Examination   Collection Time: 07/05/23  8:45 AM   BLD  Result Value Ref Range   WBC, UA 0-5 0 - 5 /hpf   RBC, Urine None seen 0 - 2 /hpf   Epithelial Cells (non renal) 0-10 0 -  10 /hpf   Bacteria, UA None seen None seen/Few  Bayer DCA Hb A1c Waived   Collection Time: 07/05/23  8:45 AM  Result Value Ref Range   HB A1C (BAYER DCA - WAIVED) 6.8 (H) 4.8 - 5.6 %  Urinalysis, Routine w reflex microscopic   Collection Time: 07/05/23  8:45 AM  Result Value Ref Range   Specific Gravity, UA 1.025  1.005 - 1.030   pH, UA 6.0 5.0 - 7.5   Color, UA Yellow Yellow   Appearance Ur Clear Clear   Leukocytes,UA Trace (A) Negative   Protein,UA Negative Negative/Trace   Glucose, UA Negative Negative   Ketones, UA Negative Negative   RBC, UA Negative Negative   Bilirubin, UA Negative Negative   Urobilinogen, Ur 0.2 0.2 - 1.0 mg/dL   Nitrite, UA Negative Negative   Microscopic Examination See below:    *Note: Due to a large number of results and/or encounters for the requested time period, some results have not been displayed. A complete set of results can be found in Results Review.      Assessment & Plan:   Problem List Items Addressed This Visit       Cardiovascular and Mediastinum   Abdominal aortic aneurysm (AAA) (HCC) (Chronic)   Ongoing.  Noted on imaging on 11/22/19.  With recommendation to monitor annually.  Will continue statin and monitor BP control.  Last CT abdomen in March 2023 with no aneurysm reported, similar in March 2024.      Persistent atrial fibrillation (HCC)   Chronic, ongoing.  Followed by cardiology.  Will continue this collaboration and medication as ordered by them.      Hypertension associated with diabetes (HCC)   Chronic, stable.  BP at goal.  Followed by cardiology.  With orthostatic BP presenting occasionally.  Continue current regimen at this time and have recommended utilizing compression hose at home, agrees to try this.  Continue collaboration with cardiology + neurology and current medication regimen.  Labs: CMP.  Recommend ensuring good fluid intake at home.        Relevant Medications   solifenacin  (VESICARE ) 10 MG tablet   Other Relevant Orders   Bayer DCA Hb A1c Waived (Completed)   Comprehensive metabolic panel with GFR   CTEPH (chronic thromboembolic pulmonary hypertension) (HCC)   Diagnosed at [facility] August 2024.  Will continue collaboration with pulmonary, recent notes and tests reviewed.  Recommend they go to website for [facility] that was  recommended by pulmonary for support or further information.      Complete heart block (HCC)   Ongoing and stable.  Followed by cardiology.  Pacemaker checks by them.         Endocrine   Type 2 diabetes mellitus with obesity (HCC) - Primary   Diagnosed on 02/21/21, currently no medications. Jardiance  caused frequent UTI and Ozempic  GI issues.  A1c 6.8% today and urine ALB 35 March 2025.  Glucometer supplies present, recommend he check sugars at least daily fasting.  Labs today.  Recommend cutting back on snacking during day and minimize sweets.  Return in 3 months. - Statin on board.  No ACE or ARB due to hypotension episodes. - Vaccinations up to date. - Eye and foot exams up to date.      Relevant Orders   Bayer DCA Hb A1c Waived (Completed)   Urinalysis, Routine w reflex microscopic (Completed)   Hyperlipidemia associated with type 2 diabetes mellitus (HCC)   Chronic, ongoing.  Continue current medication regimen  and adjust as needed. Lipid panel today.      Relevant Orders   Bayer DCA Hb A1c Waived (Completed)   Comprehensive metabolic panel with GFR   Lipid Panel w/o Chol/HDL Ratio     Genitourinary   Stage 3a chronic kidney disease (HCC) (Chronic)   Stable.  His recent labs since July 2022 have been within good ranges and improved.  If ongoing stability will discontinue this diagnosis.      Relevant Orders   Comprehensive metabolic panel with GFR     Other   Morbid obesity (HCC) (Chronic)   BMI 40.75 with HTN, A-Fib, Heart Block, and CKD.  Recommended eating smaller high protein, low fat meals more frequently and exercising 30 mins a day 5 times a week with a goal of 10-15lb weight loss in the next 3 months. Patient voiced their understanding and motivation to adhere to these recommendations.       Depression, recurrent (HCC) (Chronic)   Chronic, ongoing.  Denies SI/HI.  Continue current medication regimen and adjust as needed.  Would benefit from trial off Effexor  and  trial of SSRI, but refuses this.        Relevant Medications   LORazepam  (ATIVAN ) 1 MG tablet (Start on 07/14/2023)   Chronic pain syndrome (Chronic)   Chronic, ongoing followed by pain clinic, Dr. Welton Hall.  Discussed at length risk of benzo and opioid use in conjunction with each other.  Recommend he not take the two together at same hour during day or evening.        Anxiety (Chronic)   Chronic, ongoing.  Discussed at length risk of benzo and opioid use in conjunction with each other.  Recommend he not take the two together at same hour during day or evening. Recommend now that has CPAP and improved sleep trial cut back slowly on Ativan .  They request 90 day supply, as previous PCP supplied this.  Are aware he does have to return every 3 months for refills.  UDS up to date with pain management.        Relevant Medications   LORazepam  (ATIVAN ) 1 MG tablet (Start on 07/14/2023)   Other Visit Diagnoses       Fatigue, unspecified type       Check testosterone.   Relevant Orders   Testosterone,Free and Total        Follow up plan: Return in about 3 months (around 10/05/2023) for T2DM, HTN/HLD, ANXIETY, Depression.

## 2023-07-05 NOTE — Assessment & Plan Note (Signed)
 Diagnosed at [facility] August 2024.  Will continue collaboration with pulmonary, recent notes and tests reviewed.  Recommend they go to website for [facility] that was recommended by pulmonary for support or further information.

## 2023-07-05 NOTE — Progress Notes (Signed)
 Contacted via MyChart  Urine does not appear to have infection.  A1c remains <7%, but did trend up from 6.7% a little.  Continue heavy focus on diet and regular activity.:)

## 2023-07-05 NOTE — Assessment & Plan Note (Signed)
 Chronic, stable.  BP at goal.  Followed by cardiology.  With orthostatic BP presenting occasionally.  Continue current regimen at this time and have recommended utilizing compression hose at home, agrees to try this.  Continue collaboration with cardiology + neurology and current medication regimen.  Labs: CMP.  Recommend ensuring good fluid intake at home.

## 2023-07-05 NOTE — Assessment & Plan Note (Addendum)
 Diagnosed on 02/21/21, currently no medications. Jardiance  caused frequent UTI and Ozempic  GI issues.  A1c 6.8% today and urine ALB 35 March 2025.  Glucometer supplies present, recommend he check sugars at least daily fasting.  Labs today.  Recommend cutting back on snacking during day and minimize sweets.  Return in 3 months. - Statin on board.  No ACE or ARB due to hypotension episodes. - Vaccinations up to date. - Eye and foot exams up to date.

## 2023-07-07 LAB — COMPREHENSIVE METABOLIC PANEL WITH GFR
ALT: 19 IU/L (ref 0–44)
AST: 31 IU/L (ref 0–40)
Albumin: 4.3 g/dL (ref 3.8–4.8)
Alkaline Phosphatase: 58 IU/L (ref 44–121)
BUN/Creatinine Ratio: 15 (ref 10–24)
BUN: 16 mg/dL (ref 8–27)
Bilirubin Total: 0.5 mg/dL (ref 0.0–1.2)
CO2: 23 mmol/L (ref 20–29)
Calcium: 9.5 mg/dL (ref 8.6–10.2)
Chloride: 101 mmol/L (ref 96–106)
Creatinine, Ser: 1.08 mg/dL (ref 0.76–1.27)
Globulin, Total: 2.3 g/dL (ref 1.5–4.5)
Glucose: 144 mg/dL — ABNORMAL HIGH (ref 70–99)
Potassium: 4.5 mmol/L (ref 3.5–5.2)
Sodium: 138 mmol/L (ref 134–144)
Total Protein: 6.6 g/dL (ref 6.0–8.5)
eGFR: 70 mL/min/{1.73_m2} (ref >59)

## 2023-07-07 LAB — LIPID PANEL W/O CHOL/HDL RATIO
Cholesterol, Total: 128 mg/dL (ref 100–199)
HDL: 52 mg/dL (ref >39)
LDL Chol Calc (NIH): 58 mg/dL (ref 0–99)
Triglycerides: 93 mg/dL (ref 0–149)
VLDL Cholesterol Cal: 18 mg/dL (ref 5–40)

## 2023-07-07 LAB — TESTOSTERONE,FREE AND TOTAL
Testosterone, Free: 6 pg/mL — ABNORMAL LOW (ref 6.6–18.1)
Testosterone: 276 ng/dL (ref 264–916)

## 2023-07-09 ENCOUNTER — Ambulatory Visit: Attending: Anesthesiology | Admitting: Anesthesiology

## 2023-07-09 ENCOUNTER — Encounter: Payer: Self-pay | Admitting: Anesthesiology

## 2023-07-09 DIAGNOSIS — M1611 Unilateral primary osteoarthritis, right hip: Secondary | ICD-10-CM

## 2023-07-09 DIAGNOSIS — G894 Chronic pain syndrome: Secondary | ICD-10-CM | POA: Diagnosis not present

## 2023-07-09 DIAGNOSIS — M47816 Spondylosis without myelopathy or radiculopathy, lumbar region: Secondary | ICD-10-CM

## 2023-07-09 DIAGNOSIS — M5431 Sciatica, right side: Secondary | ICD-10-CM | POA: Diagnosis not present

## 2023-07-09 DIAGNOSIS — M5416 Radiculopathy, lumbar region: Secondary | ICD-10-CM | POA: Diagnosis not present

## 2023-07-09 DIAGNOSIS — F1190 Opioid use, unspecified, uncomplicated: Secondary | ICD-10-CM

## 2023-07-09 DIAGNOSIS — M79641 Pain in right hand: Secondary | ICD-10-CM

## 2023-07-09 DIAGNOSIS — M5432 Sciatica, left side: Secondary | ICD-10-CM | POA: Diagnosis not present

## 2023-07-09 DIAGNOSIS — M79642 Pain in left hand: Secondary | ICD-10-CM

## 2023-07-09 DIAGNOSIS — M47817 Spondylosis without myelopathy or radiculopathy, lumbosacral region: Secondary | ICD-10-CM

## 2023-07-09 MED ORDER — HYDROCODONE-ACETAMINOPHEN 5-325 MG PO TABS: 1.0000 | ORAL_TABLET | Freq: Four times a day (QID) | ORAL | 0 refills | Status: AC | PRN

## 2023-07-09 MED ORDER — HYDROCODONE-ACETAMINOPHEN 5-325 MG PO TABS: 1.0000 | ORAL_TABLET | Freq: Four times a day (QID) | ORAL | 0 refills | Status: DC | PRN

## 2023-07-09 NOTE — Progress Notes (Signed)
 Lab appt scheduled.

## 2023-07-10 NOTE — Progress Notes (Signed)
 Virtual Visit via Telephone Note  I connected with Nicholas [name] on 07/10/23 at  2:20 PM EDT by telephone and verified that I am speaking with the correct person using two identifiers.  Location: Patient: Home Provider: Pain control center   I discussed the limitations, risks, security and privacy concerns of performing an evaluation and management service by telephone and the availability of in person appointments. I also discussed with the patient that there may be a patient responsible charge related to this service. The patient expressed understanding and agreed to proceed.   History of Present Illness: I spoke with Nicholas [name] via telephone as we could not link to the video portion of the conference.  He reports that he has been doing reasonably well with his low back pain.  He still averaging 1/2 tablet of the 5 mg strength hydrocodone  approximately 3-4 times a day.  He continues to derive good functional benefit per report with his medication.  He generally gets about 50 to 75% relief of his low back pain and leg pain lasting about 4 to 6 hours.  Contingent on how much activity he has this regimen continues to work well for him.  No change in lower extremity strength or function bowel or bladder function is noted at this time.  The quality characteristic and distribution of his pain are stable in nature and continues to respond favorably to the chronic opioid therapy.  Otherwise he is in his usual state of health.  He feels that this regimen is working well.  Review of systems: General: No fevers or chills Pulmonary: No shortness of breath or dyspnea Cardiac: No angina or palpitations or lightheadedness GI: No abdominal pain or constipation Psych: No depression    Observations/Objective:  Current Outpatient Medications:    HYDROcodone -acetaminophen  (NORCO/VICODIN) 5-325 MG tablet, Take 1 tablet by mouth every 6 (six) hours as needed for moderate pain (pain score 4-6) or severe pain  (pain score 7-10)., Disp: 60 tablet, Rfl: 0   [START ON 08/08/2023] HYDROcodone -acetaminophen  (NORCO/VICODIN) 5-325 MG tablet, Take 1 tablet by mouth every 6 (six) hours as needed for moderate pain (pain score 4-6) or severe pain (pain score 7-10)., Disp: 60 tablet, Rfl: 0   acetaminophen  (TYLENOL ) 650 MG CR tablet, Take 650 mg by mouth every 8 (eight) hours as needed for pain., Disp: , Rfl:    albuterol  (VENTOLIN  HFA) 108 (90 Base) MCG/ACT inhaler, INHALE 2 PUFFS BY MOUTH EVERY 6 HOURS AS NEEDED (Patient not taking: Reported on 07/05/2023), Disp: 6.7 each, Rfl: 2   apixaban  (ELIQUIS ) 5 MG TABS tablet, Take 1 tablet (5 mg total) by mouth 2 (two) times daily., Disp: 180 tablet, Rfl: 1   Ascorbic Acid  (VITAMIN C ) 1000 MG tablet, Take 1,000 mg by mouth daily., Disp: , Rfl:    Blood Glucose Monitoring Suppl (ONETOUCH VERIO) w/Device KIT, Use to check blood sugar 3 times a day and document results, bring to appointments.  Goal is <130 fasting blood sugar and <180 two hours after meals., Disp: 1 kit, Rfl: 0   calcium -vitamin D  (OSCAL WITH D) 500-5 MG-MCG tablet, Take 1 tablet by mouth daily with breakfast., Disp: , Rfl:    Cranberry 500 MG CAPS, Take 500 mg by mouth daily., Disp: , Rfl:    gabapentin  (NEURONTIN ) 600 MG tablet, TAKE 1 TABLET BY MOUTH EVERYDAY AT BEDTIME, Disp: 90 tablet, Rfl: 4   glucose blood test strip, Use to check blood sugar 3 times a day and document results, bring to  appointments.  Goal is <130 fasting blood sugar and <180 two hours after meals., Disp: 100 each, Rfl: 12   Lancets (ONETOUCH ULTRASOFT) lancets, Use to check blood sugar 3 times a day and document results, bring to appointments.  Goal is <130 fasting blood sugar and <180 two hours after meals., Disp: 100 each, Rfl: 12   [START ON 07/14/2023] LORazepam  (ATIVAN ) 1 MG tablet, TAKE 1 TABLET BY MOUTH EVERYDAY AT BEDTIME, Disp: 90 tablet, Rfl: 0   Magnesium  400 MG TABS, Take 400 mg by mouth daily. , Disp: , Rfl:    MELATONIN PO,  Take 10 mg by mouth at bedtime., Disp: , Rfl:    midodrine  (PROAMATINE ) 5 MG tablet, Take 10 mg in the morning, take 5 mg in the afternoon, and 5 mg in the evening, Disp: 270 tablet, Rfl: 3   Multiple Vitamin (MULTIVITAMIN WITH MINERALS) TABS tablet, Take 1 tablet by mouth daily. Centrum Silver, Disp: , Rfl:    NON FORMULARY, Pt uses a cpap nightly, Disp: , Rfl:    nystatin  cream (MYCOSTATIN ), Apply 1 Application topically 2 (two) times daily., Disp: 30 g, Rfl: 4   Omega-3 Fatty Acids (FISH OIL) 1200 MG CAPS, Take 1,200 mg by mouth daily., Disp: , Rfl:    oxymetazoline  (AFRIN) 0.05 % nasal spray, Place 2 sprays into both nostrils at bedtime., Disp: , Rfl:    pantoprazole  (PROTONIX ) 20 MG tablet, Take 1 tablet (20 mg total) by mouth daily., Disp: 90 tablet, Rfl: 2   potassium chloride  SA (KLOR-CON  M) 20 MEQ tablet, Take 2 tablets (40 meq) with Torsemide  (Patient taking differently: Take 40 mEq by mouth daily as needed (when taking torsemide ).), Disp: 30 tablet, Rfl: 3   QUEtiapine  Fumarate (SEROQUEL  XR) 150 MG 24 hr tablet, Take 1 tablet (150 mg total) by mouth at bedtime., Disp: 90 tablet, Rfl: 4   rOPINIRole  (REQUIP ) 0.25 MG tablet, Take 0.25 mg by mouth 4 (four) times daily., Disp: , Rfl:    rosuvastatin  (CRESTOR ) 40 MG tablet, Take 1 tablet (40 mg total) by mouth daily., Disp: 90 tablet, Rfl: 3   solifenacin  (VESICARE ) 10 MG tablet, Take 1 tablet (10 mg total) by mouth daily., Disp: 90 tablet, Rfl: 2   tiZANidine  (ZANAFLEX ) 4 MG tablet, Take 1 tablet (4 mg total) by mouth every 6 (six) hours as needed for muscle spasms., Disp: 90 tablet, Rfl: 4   torsemide  (DEMADEX ) 20 MG tablet, TAKE 2 TABLETS (40 MG TOTAL) BY MOUTH DAILY AS NEEDED (FLUID RETENTION)., Disp: 90 tablet, Rfl: 0   trimethoprim  (TRIMPEX ) 100 MG tablet, Take 1 tablet (100 mg total) by mouth daily. (Patient not taking: Reported on 07/05/2023), Disp: 30 tablet, Rfl: 3   venlafaxine  XR (EFFEXOR -XR) 75 MG 24 hr capsule, Take 3 capsules (225  mg total) by mouth daily., Disp: 270 capsule, Rfl: 3   Past Medical History:  Diagnosis Date   Abdominal pain, chronic, right lower quadrant 12/13/2020   Anterior urethral stricture    Anxiety    Arthritis    a. knees, hips, hands;  b. 11/2013 s/p L TKA @ [facility].   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 Dr Rodolfo Clan   Depression    Diabetes mellitus without complication (HCC)    DVT (deep venous thrombosis) (HCC)    Erosive esophagitis    GERD (gastroesophageal  reflux disease)    Gross hematuria    Hyperlipemia    Hypertension    borderline   Internal hemorrhoids    Phimosis    Pulmonary embolism (HCC)    Assessment and Plan:  1. Chronic pain syndrome   2. Lumbar radiculopathy   3. Bilateral sciatica   4. Facet syndrome, lumbar   5. Bilateral hand pain   6. Chronic, continuous use of opioids   7. Osteoarthritis of right hip, unspecified osteoarthritis type   8. Facet arthritis of lumbosacral region    Based on our conversation today and it is appropriate to continue with his current regimen.  He is due for medicine today.  His prescription will be for June 16 and July 16.  Will keep him at the 5 mg dose for 2 tablets/day.  He breaks these in half and uses them 3-4 times a day appropriately.  He continues to derive good functional lifestyle improvement with the medicine and has done well with chronic opioid therapy.  No other changes in his regimen will be initiated.  Have encouraged him to continue efforts at weight loss and stretching strengthening.  Will schedule him for return to clinic in 2 months.  Continue follow-up with his primary care physicians for baseline medical care. Follow Up Instructions:    I discussed the assessment and treatment plan with the patient. The patient was provided an opportunity to ask questions and all were answered.  The patient agreed with the plan and demonstrated an understanding of the instructions.   The patient was advised to call back or seek an in-person evaluation if the symptoms worsen or if the condition fails to improve as anticipated.  I provided 30 minutes of non-face-to-face time during this encounter.   Zula Hitch, MD

## 2023-07-19 ENCOUNTER — Other Ambulatory Visit: Payer: Self-pay

## 2023-07-20 ENCOUNTER — Other Ambulatory Visit

## 2023-07-20 DIAGNOSIS — R7989 Other specified abnormal findings of blood chemistry: Secondary | ICD-10-CM

## 2023-07-21 ENCOUNTER — Ambulatory Visit: Payer: Self-pay | Admitting: Nurse Practitioner

## 2023-07-21 NOTE — Patient Outreach (Signed)
 Complex Care Management   Visit Note   Name:  Nicholas [name] MRN: [id number] DOB: May 10, 1945  Situation: Referral received for Complex Care Management related to Pulmonary  Embolism, HTN, HLD and controlled DM. I obtained verbal consent from Patient.  Visit completed with Nicholas [name] and his spouse via telephone.  Background:   Past Medical History:  Diagnosis Date   Abdominal pain, chronic, right lower quadrant 12/13/2020   Anterior urethral stricture    Anxiety    Arthritis    a. knees, hips, hands;  b. 11/2013 s/p L TKA @ [facility].   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 Dr Fernande   Depression    Diabetes mellitus without complication (HCC)    DVT (deep venous thrombosis) (HCC)    Erosive esophagitis    GERD (gastroesophageal reflux disease)    Gross hematuria    Hyperlipemia    Hypertension    borderline   Internal hemorrhoids    Phimosis    Pulmonary embolism (HCC)     Assessment: Patient Reported Symptoms: Cognitive Cognitive Status: Alert and oriented to person, place, and time, Normal speech and language skills Cognitive/Intellectual Conditions Management [RPT]: None reported or documented in medical history or problem list Health Maintenance Behaviors: Annual physical exam Healing Pattern: Average Health Facilitated by: Pain control, Rest  Neurological Neurological Review of Symptoms: No symptoms reported  HEENT HEENT Symptoms Reported: No symptoms reported  Cardiovascular Cardiovascular Symptoms Reported: No symptoms reported  Respiratory Respiratory Symptoms Reported: No symptoms reported  Endocrine Patient reports the following symptoms related to hypoglycemia or hyperglycemia : No symptoms reported  Gastrointestinal Gastrointestinal Symptoms Reported: No symptoms reported  Genitourinary Genitourinary Symptoms Reported: No symptoms  reported  Integumentary Integumentary Symptoms Reported: No symptoms reported  Musculoskeletal Musculoskelatal Symptoms Reviewed: Other Other Musculoskeletal Symptoms: Chronic Join Pain  Psychosocial Psychosocial Symptoms Reported: No symptoms reported Quality of Family Relationships: supportive Do you feel physically threatened by others?: No      07/19/2023    4:45 PM  Depression screen PHQ 2/9  Decreased Interest 0  Down, Depressed, Hopeless 0  PHQ - 2 Score 0    There were no vitals filed for this visit.  Medications Reviewed Today     Reviewed by Karoline Lima, RN (Registered Nurse) on 07/19/23 at [id number]  Med List Status: <None>   Medication Order Taking? Sig Documenting Provider Last Dose Status Informant  acetaminophen  (TYLENOL ) 650 MG CR tablet [id number]  Take 650 mg by mouth every 8 (eight) hours as needed for pain. [provider]  Active Spouse/Significant Other  albuterol  (VENTOLIN  HFA) 108 (90 Base) MCG/ACT inhaler [id number]  INHALE 2 PUFFS BY MOUTH EVERY 6 HOURS AS NEEDED  Patient not taking: Reported on 07/05/2023   Valerio Moris T, NP  Active Spouse/Significant Other           Med Note [facility], Othelia Riederer N   Fri Jun 22, 2023  9:32 AM) Reports not using often  apixaban  (ELIQUIS ) 5 MG TABS tablet [id number]  Take 1 tablet (5 mg total) by mouth 2 (two) times daily. Fernande Elspeth BROCKS, MD  Active   Ascorbic Acid  (VITAMIN C ) 1000 MG tablet [id number]  Take 1,000 mg by mouth daily. [provider]  Active Spouse/Significant Other  Blood Glucose Monitoring Suppl [facility] VERIO) w/Device KIT [id number]  Use to check blood  sugar 3 times a day and document results, bring to appointments.  Goal is <130 fasting blood sugar and <180 two hours after meals. Valerio Moris T, NP  Active Spouse/Significant Other  calcium -vitamin D  (OSCAL WITH D) 500-5 MG-MCG tablet [id number]  Take 1 tablet by mouth daily with breakfast. [provider]  Active Spouse/Significant  Other  Cranberry 500 MG CAPS [id number]  Take 500 mg by mouth daily. [provider]  Active Spouse/Significant Other  gabapentin  (NEURONTIN ) 600 MG tablet [id number]  TAKE 1 TABLET BY MOUTH EVERYDAY AT BEDTIME Cannady, Jolene T, NP  Active Spouse/Significant Other  glucose blood test strip [id number]  Use to check blood sugar 3 times a day and document results, bring to appointments.  Goal is <130 fasting blood sugar and <180 two hours after meals. Cannady, Jolene T, NP  Active Spouse/Significant Other  HYDROcodone -acetaminophen  (NORCO/VICODIN) 5-325 MG tablet [id number]  Take 1 tablet by mouth every 6 (six) hours as needed for moderate pain (pain score 4-6) or severe pain (pain score 7-10). Myra Lynwood MATSU, MD  Active   HYDROcodone -acetaminophen  (NORCO/VICODIN) 5-325 MG tablet [id number]  Take 1 tablet by mouth every 6 (six) hours as needed for moderate pain (pain score 4-6) or severe pain (pain score 7-10). Myra Lynwood MATSU, MD  Active   Lancets [facility] ULTRASOFT) lancets [id number]  Use to check blood sugar 3 times a day and document results, bring to appointments.  Goal is <130 fasting blood sugar and <180 two hours after meals. Cannady, Jolene T, NP  Active Spouse/Significant Other  LORazepam  (ATIVAN ) 1 MG tablet [id number]  TAKE 1 TABLET BY MOUTH EVERYDAY AT BEDTIME Cannady, Jolene T, NP  Active   Magnesium  400 MG TABS [id number]  Take 400 mg by mouth daily.  [provider]  Active Spouse/Significant Other  MELATONIN PO [id number]  Take 10 mg by mouth at bedtime. [provider]  Active Spouse/Significant Other  midodrine  (PROAMATINE ) 5 MG tablet [id number]  Take 10 mg in the morning, take 5 mg in the afternoon, and 5 mg in the evening Fernande Elspeth BROCKS, MD  Active   Multiple Vitamin (MULTIVITAMIN WITH MINERALS) TABS tablet [id number]  Take 1 tablet by mouth daily. Centrum Silver [provider]  Active Spouse/Significant Other  NON FORMULARY [id number]  Pt uses a cpap  nightly [provider]  Active Spouse/Significant Other  nystatin  cream (MYCOSTATIN ) [id number]  Apply 1 Application topically 2 (two) times daily. Johnson, Megan P, DO  Active Spouse/Significant Other  Omega-3 Fatty Acids (FISH OIL) 1200 MG CAPS [id number]  Take 1,200 mg by mouth daily. [provider]  Active Spouse/Significant Other  oxymetazoline  (AFRIN) 0.05 % nasal spray [id number]  Place 2 sprays into both nostrils at bedtime. [provider]  Active Spouse/Significant Other  pantoprazole  (PROTONIX ) 20 MG tablet [id number]  Take 1 tablet (20 mg total) by mouth daily. Cannady, Jolene T, NP  Active   potassium chloride  SA (KLOR-CON  M) 20 MEQ tablet [id number]  Take 2 tablets (40 meq) with Torsemide   Patient taking differently: Take 40 mEq by mouth daily as needed (when taking torsemide ).   Cannady, Jolene T, NP  Active Spouse/Significant Other  QUEtiapine  Fumarate (SEROQUEL  XR) 150 MG 24 hr tablet [id number]  Take 1 tablet (150 mg total) by mouth at bedtime. Cannady, Jolene T, NP  Active   rOPINIRole  (REQUIP ) 0.25 MG tablet [id number]  Take 0.25 mg by mouth 4 (four) times daily. [provider]  Active Spouse/Significant Other  rosuvastatin  (CRESTOR ) 40 MG tablet  [id number]  Take 1 tablet (40 mg total) by mouth daily. Cannady, Jolene T, NP  Active   solifenacin  (VESICARE ) 10 MG tablet [id number]  Take 1 tablet (10 mg total) by mouth daily. Cannady, Jolene T, NP  Active   tiZANidine  (ZANAFLEX ) 4 MG tablet [id number]  Take 1 tablet (4 mg total) by mouth every 6 (six) hours as needed for muscle spasms. Cannady, Jolene T, NP  Active Spouse/Significant Other  torsemide  (DEMADEX ) 20 MG tablet [id number]  TAKE 2 TABLETS (40 MG TOTAL) BY MOUTH DAILY AS NEEDED (FLUID RETENTION). Fernande Elspeth BROCKS, MD  Active Spouse/Significant Other  trimethoprim  (TRIMPEX ) 100 MG tablet [id number]  Take 1 tablet (100 mg total) by mouth daily.  Patient not taking: Reported on 07/05/2023    Vaillancourt, Samantha, PA-C  Active Spouse/Significant Other  venlafaxine  XR (EFFEXOR -XR) 75 MG 24 hr capsule [id number]  Take 3 capsules (225 mg total) by mouth daily. Valerio Melanie DASEN, NP  Active   Med List Note Broadus Reda [place], [place] 04/18/23 1326): UDS 12-14-22 MR 06-22-23  When searching in control substance database search exactly this: Asencion Keithly jr.            Recommendation:   Continue Current Plan of Care  Follow Up Plan:   Telephone follow up appointment with Nurse Case Manager on August 21, 2023   Jackson Acron [facility] RN Care Manager Direct Dial: [phone]  Fax: [phone] Website: [email or web address]

## 2023-07-21 NOTE — Patient Instructions (Addendum)
 Thank you for allowing the Complex Care Management team to participate in your care. It was great speaking with you.  Keep up the great work with exercising and managing your care!  We will follow up on August 21, 2023 at 0930. Please do not hesitate to contact me if you require assistance prior to our next outreach.    Jackson Acron [facility] Population Health RN Care Manager Direct Dial: [phone]  Fax: [phone] Website: [email or web address]

## 2023-07-21 NOTE — Progress Notes (Signed)
 Please let Nicholas [name] know his testosterone  remains under 300 this check, I recommend he discuss with urology next visit.  I am unsure replacement therapy could be performed with him, but he can discuss this with them.  Low levels can cause fatigue.  Any questions? Keep being amazing!!  Thank you for allowing me to participate in your care.  I appreciate you. Kindest regards, Rishika Mccollom

## 2023-07-22 LAB — TESTOSTERONE, FREE, TOTAL, SHBG
Sex Hormone Binding: 67.4 nmol/L (ref 19.3–76.4)
Testosterone, Free: 4.2 pg/mL — AB (ref 6.6–18.1)
Testosterone: 274 ng/dL (ref 264–916)

## 2023-08-02 NOTE — Progress Notes (Signed)
 Remote pacemaker transmission.

## 2023-08-12 NOTE — Progress Notes (Unsigned)
 08/14/2023 9:49 AM   Nicholas [name] 12-13-1945 [id number]  Referring provider: Valerio Melanie ONEIDA, NP [address] [place],  [place] [address]  Urological history: 1. OAB - solifenacin  10 mg daily   2. rUTI's - trimethoprim  100 mg daily  3. Elevated PSA - PSA (2024) 6.8 - discontinued screening   4. BXO - circumcision (2014)  5. Urethral stricture  - DVIU (2015)   Chief Complaint  Patient presents with   burning with urination   HPI: Nicholas [name] is a 78 y.o. man who presents today for low T with his wife, Nicholas [name].    Previous records reviewed.   When he presented for his appointment today he stated he had 1 week of burning with urination.  He states he was urinating with a good stream and felt like he was emptying his bladder.  He also noted an increase in urinary frequency.  Patient denies any modifying or aggravating factors.  Patient denies any recent UTI's, gross hematuria or suprapubic/flank pain.  Patient denies any fevers, chills, nausea or vomiting.   UA with moderate bacteria.  PVR 0 mL   He had a total am testosterone  of 274 on 07/20/2023 and a total am testosterone  of 276 on 07/05/2023.   He stated that he started feeling fatigued after he was no longer able to attend cardiac rehab.      Lab Results  Component Value Date   HGBA1C 6.8 (H) 07/05/2023   PMH: Past Medical History:  Diagnosis Date   Abdominal pain, chronic, right lower quadrant 12/13/2020   Anterior urethral stricture    Anxiety    Arthritis    a. knees, hips, hands;  b. 11/2013 s/p L TKA @ [facility].   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 Dr Fernande   Depression    Diabetes mellitus without complication (HCC)    DVT (deep venous thrombosis) (HCC)    Erosive esophagitis    GERD (gastroesophageal reflux disease)    Gross hematuria    Hyperlipemia     Hypertension    borderline   Internal hemorrhoids    Phimosis    Pulmonary embolism (HCC)     Surgical History: Past Surgical History:  Procedure Laterality Date   ATRIAL FIBRILLATION ABLATION N/A 05/17/2020   Procedure: ATRIAL FIBRILLATION ABLATION;  Surgeon: Cindie Ole ONEIDA, MD;  Location: [facility] INVASIVE CV LAB;  Service: Cardiovascular;  Laterality: N/A;   BUNIONECTOMY Bilateral 01/06/2015   Procedure: ROMAYNE;  Surgeon: Kayla Pinal, MD;  Location: [facility] ORS;  Service: Orthopedics;  Laterality: Bilateral;   CARDIAC CATHETERIZATION  ~ 2005   once   CATARACT EXTRACTION W/ INTRAOCULAR LENS  IMPLANT, BILATERAL Bilateral ~ 2010   COLONOSCOPY WITH PROPOFOL  N/A 12/07/2015   Procedure: COLONOSCOPY WITH PROPOFOL ;  Surgeon: Gladis RAYMOND Mariner, MD;  Location: [facility] ENDOSCOPY;  Service: Endoscopy;  Laterality: N/A;   COLONOSCOPY WITH PROPOFOL  N/A 11/26/2020   Procedure: COLONOSCOPY WITH PROPOFOL ;  Surgeon: Unk Corinn Skiff, MD;  Location: [facility] ENDOSCOPY;  Service: Gastroenterology;  Laterality: N/A;   EP IMPLANTABLE DEVICE N/A 02/16/2016   MDT dual chamber (His Bundle) pacemaker implanted by Dr Fernande for intermittent complete heart block   ESOPHAGOGASTRODUODENOSCOPY (EGD) WITH PROPOFOL  N/A 12/07/2015   Procedure: ESOPHAGOGASTRODUODENOSCOPY (EGD) WITH PROPOFOL ;  Surgeon: Gladis RAYMOND Mariner, MD;  Location: [facility] ENDOSCOPY;  Service: Endoscopy;  Laterality: N/A;   ESOPHAGOGASTRODUODENOSCOPY (EGD) WITH PROPOFOL  N/A 05/17/2017   Procedure: ESOPHAGOGASTRODUODENOSCOPY (EGD) WITH PROPOFOL ;  Surgeon: Gaylyn Gladis PENNER, MD;  Location: [facility] ENDOSCOPY;  Service: Endoscopy;  Laterality: N/A;   HAMMER TOE SURGERY Bilateral 01/06/2015   Procedure: HAMMER TOE CORRECTION;  Surgeon: Kayla Pinal, MD;  Location: [facility] ORS;  Service: Orthopedics;  Laterality: Bilateral;   KNEE CARTILAGE SURGERY Left 1965   football injury   LEAD EXTRACTION N/A 03/12/2023   Procedure: LEAD EXTRACTION;  Surgeon: Cindie Ole DASEN, MD;  Location: [facility] INVASIVE CV LAB;  Service: Cardiovascular;  Laterality: N/A;   Left Total Knee Arthroplasty     a. 11/2013 [facility].   PACEMAKER IMPLANT  2017   PACEMAKER IMPLANT N/A 03/12/2023   Procedure: PACEMAKER IMPLANT;  Surgeon: Cindie Ole DASEN, MD;  Location: [facility] INVASIVE CV LAB;  Service: Cardiovascular;  Laterality: N/A;   PILONIDAL CYST EXCISION  1970's   TOTAL HIP ARTHROPLASTY Right 2004   TOTAL HIP ARTHROPLASTY Left 2006   TOTAL KNEE ARTHROPLASTY Right    UPPER GI ENDOSCOPY      Home Medications:  Allergies as of 08/14/2023   No Known Allergies      Medication List        Accurate as of August 14, 2023  9:49 AM. If you have any questions, ask your nurse or doctor.          acetaminophen  650 MG CR tablet Commonly known as: TYLENOL  Take 650 mg by mouth every 8 (eight) hours as needed for pain.   albuterol  108 (90 Base) MCG/ACT inhaler Commonly known as: VENTOLIN  HFA INHALE 2 PUFFS BY MOUTH EVERY 6 HOURS AS NEEDED   apixaban  5 MG Tabs tablet Commonly known as: Eliquis  Take 1 tablet (5 mg total) by mouth 2 (two) times daily.   calcium -vitamin D  500-5 MG-MCG tablet Commonly known as: OSCAL WITH D Take 1 tablet by mouth daily with breakfast.   cefUROXime  250 MG tablet Commonly known as: CEFTIN  Take 1 tablet (250 mg total) by mouth 2 (two) times daily with a meal for 7 days.   Cranberry 500 MG Caps Take 500 mg by mouth daily.   Fish Oil 1200 MG Caps Take 1,200 mg by mouth daily.   gabapentin  600 MG tablet Commonly known as: NEURONTIN  TAKE 1 TABLET BY MOUTH EVERYDAY AT BEDTIME   glucose blood test strip Use to check blood sugar 3 times a day and document results, bring to appointments.  Goal is <130 fasting blood sugar and <180 two hours after meals.   HYDROcodone -acetaminophen  5-325 MG tablet Commonly known as: NORCO/VICODIN Take 1 tablet by mouth every 6 (six) hours as needed for moderate pain (pain score 4-6) or severe pain (pain score  7-10).   LORazepam  1 MG tablet Commonly known as: ATIVAN  TAKE 1 TABLET BY MOUTH EVERYDAY AT BEDTIME   Magnesium  400 MG Tabs Take 400 mg by mouth daily.   MELATONIN PO Take 10 mg by mouth at bedtime.   midodrine  5 MG tablet Commonly known as: PROAMATINE  Take 10 mg in the morning, take 5 mg in the afternoon, and 5 mg in the evening   multivitamin with minerals Tabs tablet Take 1 tablet by mouth daily. Centrum Silver   NON FORMULARY Pt uses a cpap nightly   nystatin  cream Commonly known as: MYCOSTATIN  Apply 1 Application topically 2 (two) times daily.   onetouch ultrasoft lancets Use to check blood sugar 3 times a day and document results, bring  to appointments.  Goal is <130 fasting blood sugar and <180 two hours after meals.   OneTouch Verio w/Device Kit Use to check blood sugar 3 times a day and document results, bring to appointments.  Goal is <130 fasting blood sugar and <180 two hours after meals.   oxymetazoline  0.05 % nasal spray Commonly known as: AFRIN Place 2 sprays into both nostrils at bedtime.   pantoprazole  20 MG tablet Commonly known as: PROTONIX  Take 1 tablet (20 mg total) by mouth daily.   potassium chloride  SA 20 MEQ tablet Commonly known as: KLOR-CON  M Take 2 tablets (40 meq) with Torsemide  What changed:  how much to take how to take this when to take this reasons to take this additional instructions   QUEtiapine  Fumarate 150 MG 24 hr tablet Commonly known as: SEROQUEL  XR Take 1 tablet (150 mg total) by mouth at bedtime.   rOPINIRole  0.25 MG tablet Commonly known as: REQUIP  Take 0.25 mg by mouth 4 (four) times daily.   rosuvastatin  40 MG tablet Commonly known as: CRESTOR  Take 1 tablet (40 mg total) by mouth daily.   solifenacin  10 MG tablet Commonly known as: VESICARE  Take 1 tablet (10 mg total) by mouth daily.   tiZANidine  4 MG tablet Commonly known as: Zanaflex  Take 1 tablet (4 mg total) by mouth every 6 (six) hours as needed for  muscle spasms.   torsemide  20 MG tablet Commonly known as: DEMADEX  TAKE 2 TABLETS (40 MG TOTAL) BY MOUTH DAILY AS NEEDED (FLUID RETENTION).   trimethoprim  100 MG tablet Commonly known as: TRIMPEX  Take 1 tablet (100 mg total) by mouth daily.   venlafaxine  XR 75 MG 24 hr capsule Commonly known as: EFFEXOR -XR Take 3 capsules (225 mg total) by mouth daily.   vitamin C  1000 MG tablet Take 1,000 mg by mouth daily.        Allergies: No Known Allergies  Family History: Family History  Problem Relation Age of Onset   Alcoholism Mother        died in her 28's.   Cancer Mother    Stroke Father        deceased   Diabetes Father    Hypertension Father    Glaucoma Sister    Breast cancer Sister    Diabetes Daughter    Asthma Son    Stroke Paternal Grandmother    Prostate cancer Paternal Grandfather     Social History: See HPI for pertinent social history  ROS: Pertinent ROS in HPI  Physical Exam: BP (!) 147/92   Pulse 82   Ht 5' 10 (1.778 m)   Wt 280 lb (127 kg)   BMI 40.18 kg/m   Constitutional:  Well nourished. Alert and oriented, No acute distress. HEENT: [place] AT, moist mucus membranes.  Trachea midline Cardiovascular: No clubbing, cyanosis, or edema. Respiratory: Normal respiratory effort, no increased work of breathing. Neurologic: Grossly intact, no focal deficits, moving all 4 extremities. Psychiatric: Normal mood and affect.  Laboratory Data: See EPIC and HPI  I have reviewed the labs.   Pertinent Imaging:  08/14/23 09:17  Scan Result 0ml   Assessment & Plan:    1. 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 Dysuria  - UA w/ moderate bacteria - urine culture pending - Started on Ceftin  250 mg twice daily, will adjust if necessary once urine culture results are available - The patient and his wife asked if he could be placed on a prophylactic daily antibiotic, I expressed that my opinion was to see what this culture showed, if it is an infection, make sure the infection is treated with culture appropriate antibiotics and if his symptoms abate, continue to monitor.   I explained that daily antibiotics have side effects such as development of resistant bacteria and affecting renal function and my preference is to wait until he starts having rUTI's again before considering another prophylactic antibiotic  - We also discussed that these may not be a symptom of UTI, but rather a symptom of a stricture as he has a history of urethral stricture disease, if his urine culture does return negative, I would like to see him back in the office to discuss possible cystoscopy  Return for Follow up pending labs.  These notes generated with voice recognition software. I apologize for typographical errors.  CLOTILDA HELON RIGGERS  [facility] [address]  [address] [place], [place] [address] [phone]

## 2023-08-14 ENCOUNTER — Ambulatory Visit (INDEPENDENT_AMBULATORY_CARE_PROVIDER_SITE_OTHER): Admitting: Urology

## 2023-08-14 ENCOUNTER — Encounter: Payer: Self-pay | Admitting: Urology

## 2023-08-14 VITALS — BP 147/92 | HR 82 | Ht 70.0 in | Wt 280.0 lb

## 2023-08-14 DIAGNOSIS — R300 Dysuria: Secondary | ICD-10-CM | POA: Diagnosis not present

## 2023-08-14 DIAGNOSIS — E291 Testicular hypofunction: Secondary | ICD-10-CM | POA: Diagnosis not present

## 2023-08-14 LAB — URINALYSIS, COMPLETE
Bilirubin, UA: NEGATIVE
Glucose, UA: NEGATIVE
Ketones, UA: NEGATIVE
Leukocytes,UA: NEGATIVE
Nitrite, UA: NEGATIVE
RBC, UA: NEGATIVE
Specific Gravity, UA: 1.02 (ref 1.005–1.030)
Urobilinogen, Ur: 0.2 mg/dL (ref 0.2–1.0)
pH, UA: 6.5 (ref 5.0–7.5)

## 2023-08-14 LAB — MICROSCOPIC EXAMINATION

## 2023-08-14 LAB — BLADDER SCAN AMB NON-IMAGING

## 2023-08-14 MED ORDER — CEFUROXIME AXETIL 250 MG PO TABS: 250.0000 mg | ORAL_TABLET | Freq: Two times a day (BID) | ORAL | 0 refills | Status: AC

## 2023-08-16 LAB — CULTURE, URINE COMPREHENSIVE

## 2023-08-17 ENCOUNTER — Other Ambulatory Visit: Payer: Self-pay | Admitting: Nurse Practitioner

## 2023-08-17 ENCOUNTER — Ambulatory Visit: Payer: Self-pay | Admitting: Urology

## 2023-08-20 ENCOUNTER — Ambulatory Visit: Attending: Anesthesiology | Admitting: Anesthesiology

## 2023-08-20 ENCOUNTER — Encounter: Payer: Self-pay | Admitting: Anesthesiology

## 2023-08-20 DIAGNOSIS — M5431 Sciatica, right side: Secondary | ICD-10-CM | POA: Diagnosis not present

## 2023-08-20 DIAGNOSIS — G894 Chronic pain syndrome: Secondary | ICD-10-CM | POA: Diagnosis not present

## 2023-08-20 DIAGNOSIS — M5416 Radiculopathy, lumbar region: Secondary | ICD-10-CM

## 2023-08-20 DIAGNOSIS — M47816 Spondylosis without myelopathy or radiculopathy, lumbar region: Secondary | ICD-10-CM

## 2023-08-20 DIAGNOSIS — F1190 Opioid use, unspecified, uncomplicated: Secondary | ICD-10-CM

## 2023-08-20 DIAGNOSIS — M79642 Pain in left hand: Secondary | ICD-10-CM

## 2023-08-20 DIAGNOSIS — M79641 Pain in right hand: Secondary | ICD-10-CM

## 2023-08-20 DIAGNOSIS — M5432 Sciatica, left side: Secondary | ICD-10-CM

## 2023-08-20 DIAGNOSIS — M47817 Spondylosis without myelopathy or radiculopathy, lumbosacral region: Secondary | ICD-10-CM

## 2023-08-20 DIAGNOSIS — M1611 Unilateral primary osteoarthritis, right hip: Secondary | ICD-10-CM

## 2023-08-20 MED ORDER — HYDROCODONE-ACETAMINOPHEN 5-325 MG PO TABS: 1.0000 | ORAL_TABLET | Freq: Four times a day (QID) | ORAL | 0 refills | Status: DC | PRN

## 2023-08-20 MED ORDER — HYDROCODONE-ACETAMINOPHEN 5-325 MG PO TABS: 1.0000 | ORAL_TABLET | Freq: Four times a day (QID) | ORAL | 0 refills | Status: AC | PRN

## 2023-08-20 NOTE — Telephone Encounter (Signed)
 Requested medication (s) are due for refill today -no  Requested medication (s) are on the active medication list -no  Future visit scheduled -yes  Last refill: no longer on current medication list  Notes to clinic: off protocol- provider review , no longer listed on current medication list  Requested Prescriptions  Pending Prescriptions Disp Refills   nystatin  (MYCOSTATIN /NYSTOP ) powder [Pharmacy Med Name: NYSTATIN  100,000 UNIT/GM POWD] 60 g 2    Sig: APPLY TOPICALLY 3 TIMES A DAY     Off-Protocol Failed - 08/20/2023 11:11 AM      Failed - Medication not assigned to a protocol, review manually.      Passed - Valid encounter within last 12 months    Recent Outpatient Visits           1 month ago Type 2 diabetes mellitus with obesity (HCC)   [place] [facility] [place], [place] T, NP   4 months ago Type 2 diabetes mellitus with obesity (HCC)   [place] [facility] [place], Melanie DASEN, NP       Future Appointments             In 7 months Maurine Lukes, PA-C [facility] [place]               Requested Prescriptions  Pending Prescriptions Disp Refills   nystatin  (MYCOSTATIN /NYSTOP ) powder [Pharmacy Med Name: NYSTATIN  100,000 UNIT/GM POWD] 60 g 2    Sig: APPLY TOPICALLY 3 TIMES A DAY     Off-Protocol Failed - 08/20/2023 11:11 AM      Failed - Medication not assigned to a protocol, review manually.      Passed - Valid encounter within last 12 months    Recent Outpatient Visits           1 month ago Type 2 diabetes mellitus with obesity (HCC)   [place] [facility] [place], [place] T, NP   4 months ago Type 2 diabetes mellitus with obesity (HCC)   [place] [facility] [place], Melanie DASEN, NP       Future Appointments             In 7 months Vaillancourt, Samantha, PA-C [facility] [place]

## 2023-08-20 NOTE — Progress Notes (Signed)
 Virtual Visit via Telephone Note  I connected with Nicholas [name] on 08/20/23 at 11:00 AM EDT by telephone and verified that I am speaking with the correct person using two identifiers.  Location: Patient: Home Provider: Pain control center   I discussed the limitations, risks, security and privacy concerns of performing an evaluation and management service by telephone and the availability of in person appointments. I also discussed with the patient that there may be a patient responsible charge related to this service. The patient expressed understanding and agreed to proceed.   History of Present Illness: I spoke with Tommy Fulmore via telephone as we could not link to the video portion of the conference.  He reports that he is doing reasonably well in regards to his back pain hip pain and lower leg pain.  The quality characteristic and distribution have remained stable without recent change.  He still takes medications with the hydrocodone  5 mg tablets split in 4:30 times a day averaging 10 mg/day.  This continues to work well for him.  No other changes in the quality characteristic or distribution of the pain are noted.  He is otherwise doing well.  No change in lower extremity strength or function are noted.  He does not mention that he has had some problems with his energy level and does have low testosterone  and is contemplating adding an a supplement.  Review of systems: General: No fevers or chills Pulmonary: No shortness of breath or dyspnea Cardiac: No angina or palpitations or lightheadedness GI: No abdominal pain or constipation Psych: No depression    Observations/Objective:  Current Outpatient Medications:    [START ON 10/14/2023] HYDROcodone -acetaminophen  (NORCO/VICODIN) 5-325 MG tablet, Take 1 tablet by mouth every 6 (six) hours as needed for moderate pain (pain score 4-6) or severe pain (pain score 7-10)., Disp: 60 tablet, Rfl: 0   acetaminophen  (TYLENOL ) 650 MG CR tablet,  Take 650 mg by mouth every 8 (eight) hours as needed for pain., Disp: , Rfl:    albuterol  (VENTOLIN  HFA) 108 (90 Base) MCG/ACT inhaler, INHALE 2 PUFFS BY MOUTH EVERY 6 HOURS AS NEEDED (Patient not taking: Reported on 07/05/2023), Disp: 6.7 each, Rfl: 2   apixaban  (ELIQUIS ) 5 MG TABS tablet, Take 1 tablet (5 mg total) by mouth 2 (two) times daily., Disp: 180 tablet, Rfl: 1   Ascorbic Acid  (VITAMIN C ) 1000 MG tablet, Take 1,000 mg by mouth daily., Disp: , Rfl:    Blood Glucose Monitoring Suppl (ONETOUCH VERIO) w/Device KIT, Use to check blood sugar 3 times a day and document results, bring to appointments.  Goal is <130 fasting blood sugar and <180 two hours after meals., Disp: 1 kit, Rfl: 0   calcium -vitamin D  (OSCAL WITH D) 500-5 MG-MCG tablet, Take 1 tablet by mouth daily with breakfast., Disp: , Rfl:    cefUROXime  (CEFTIN ) 250 MG tablet, Take 1 tablet (250 mg total) by mouth 2 (two) times daily with a meal for 7 days., Disp: 14 tablet, Rfl: 0   Cranberry 500 MG CAPS, Take 500 mg by mouth daily., Disp: , Rfl:    gabapentin  (NEURONTIN ) 600 MG tablet, TAKE 1 TABLET BY MOUTH EVERYDAY AT BEDTIME, Disp: 90 tablet, Rfl: 4   glucose blood test strip, Use to check blood sugar 3 times a day and document results, bring to appointments.  Goal is <130 fasting blood sugar and <180 two hours after meals., Disp: 100 each, Rfl: 12   [START ON 09/14/2023] HYDROcodone -acetaminophen  (NORCO/VICODIN) 5-325 MG tablet, Take  1 tablet by mouth every 6 (six) hours as needed for moderate pain (pain score 4-6) or severe pain (pain score 7-10)., Disp: 60 tablet, Rfl: 0   Lancets (ONETOUCH ULTRASOFT) lancets, Use to check blood sugar 3 times a day and document results, bring to appointments.  Goal is <130 fasting blood sugar and <180 two hours after meals., Disp: 100 each, Rfl: 12   LORazepam  (ATIVAN ) 1 MG tablet, TAKE 1 TABLET BY MOUTH EVERYDAY AT BEDTIME, Disp: 90 tablet, Rfl: 0   Magnesium  400 MG TABS, Take 400 mg by mouth daily.  , Disp: , Rfl:    MELATONIN PO, Take 10 mg by mouth at bedtime., Disp: , Rfl:    midodrine  (PROAMATINE ) 5 MG tablet, Take 10 mg in the morning, take 5 mg in the afternoon, and 5 mg in the evening, Disp: 270 tablet, Rfl: 3   Multiple Vitamin (MULTIVITAMIN WITH MINERALS) TABS tablet, Take 1 tablet by mouth daily. Centrum Silver, Disp: , Rfl:    NON FORMULARY, Pt uses a cpap nightly, Disp: , Rfl:    nystatin  cream (MYCOSTATIN ), Apply 1 Application topically 2 (two) times daily., Disp: 30 g, Rfl: 4   Omega-3 Fatty Acids (FISH OIL) 1200 MG CAPS, Take 1,200 mg by mouth daily., Disp: , Rfl:    oxymetazoline  (AFRIN) 0.05 % nasal spray, Place 2 sprays into both nostrils at bedtime., Disp: , Rfl:    pantoprazole  (PROTONIX ) 20 MG tablet, Take 1 tablet (20 mg total) by mouth daily., Disp: 90 tablet, Rfl: 2   potassium chloride  SA (KLOR-CON  M) 20 MEQ tablet, Take 2 tablets (40 meq) with Torsemide  (Patient taking differently: Take 40 mEq by mouth daily as needed (when taking torsemide ).), Disp: 30 tablet, Rfl: 3   QUEtiapine  Fumarate (SEROQUEL  XR) 150 MG 24 hr tablet, Take 1 tablet (150 mg total) by mouth at bedtime., Disp: 90 tablet, Rfl: 4   rOPINIRole  (REQUIP ) 0.25 MG tablet, Take 0.25 mg by mouth 4 (four) times daily., Disp: , Rfl:    rosuvastatin  (CRESTOR ) 40 MG tablet, Take 1 tablet (40 mg total) by mouth daily., Disp: 90 tablet, Rfl: 3   solifenacin  (VESICARE ) 10 MG tablet, Take 1 tablet (10 mg total) by mouth daily., Disp: 90 tablet, Rfl: 2   tiZANidine  (ZANAFLEX ) 4 MG tablet, Take 1 tablet (4 mg total) by mouth every 6 (six) hours as needed for muscle spasms., Disp: 90 tablet, Rfl: 4   torsemide  (DEMADEX ) 20 MG tablet, TAKE 2 TABLETS (40 MG TOTAL) BY MOUTH DAILY AS NEEDED (FLUID RETENTION)., Disp: 90 tablet, Rfl: 0   trimethoprim  (TRIMPEX ) 100 MG tablet, Take 1 tablet (100 mg total) by mouth daily. (Patient not taking: Reported on 07/05/2023), Disp: 30 tablet, Rfl: 3   venlafaxine  XR (EFFEXOR -XR) 75 MG 24  hr capsule, Take 3 capsules (225 mg total) by mouth daily., Disp: 270 capsule, Rfl: 3   Past Medical History:  Diagnosis Date   Abdominal pain, chronic, right lower quadrant 12/13/2020   Anterior urethral stricture    Anxiety    Arthritis    a. knees, hips, hands;  b. 11/2013 s/p L TKA @ [facility].   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 Dr Fernande   Depression    Diabetes mellitus without complication (HCC)    DVT (deep venous thrombosis) (HCC)    Erosive esophagitis  GERD (gastroesophageal reflux disease)    Gross hematuria    Hyperlipemia    Hypertension    borderline   Internal hemorrhoids    Phimosis    Pulmonary embolism (HCC)    Assessment and Plan:  1. Chronic pain syndrome   2. Lumbar radiculopathy   3. Bilateral sciatica   4. Facet syndrome, lumbar   5. Bilateral hand pain   6. Chronic, continuous use of opioids   7. Osteoarthritis of right hip, unspecified osteoarthritis type   8. Facet arthritis of lumbosacral region    Based on conversation upon review of the [place]  practitioner database information it is appropriate to refill his medicine for the next 2 months.  This be dated for August 22 and September 21.  No other changes in his regimen will be initiated.  Have encouraged him to continue with exercise stretching strengthening as he is doing and continue golf as tolerated.  We have talked about heat avoidance additionally.  Continue follow-up with his primary care physicians for routine medical care with return to clinic scheduled in 2 months. Follow Up Instructions:    I discussed the assessment and treatment plan with the patient. The patient was provided an opportunity to ask questions and all were answered. The patient agreed with the plan and demonstrated an understanding of the instructions.   The patient was advised  to call back or seek an in-person evaluation if the symptoms worsen or if the condition fails to improve as anticipated.  I provided 30 minutes of non-face-to-face time during this encounter.   Lynwood KANDICE Clause, MD

## 2023-08-21 ENCOUNTER — Telehealth: Payer: Self-pay

## 2023-08-23 NOTE — Patient Instructions (Addendum)
 Nicholas [name]

## 2023-08-28 ENCOUNTER — Telehealth: Payer: Self-pay

## 2023-08-28 NOTE — Telephone Encounter (Signed)
 Can you please call pt to triage? Wife called pt is finished with abx. Per Sam's note in chart, he was negative for infection. I scheduled a cysto w/[facility] in 2 weeks. Wife states he is still having pain and burning w/urniation and can only go a little bit.     Called pt he states he did not wake up at all last night to urinate. This a.m. he urinated a good amount with no issues.   + pushing fluids - fever  -pain  Encouraged pt to push fluids. Urinate q 2h. Keep cysto appt. If he develops fever, pain or is unable to urinate he needs to call the office.   Pt voiced understanding.

## 2023-09-12 ENCOUNTER — Ambulatory Visit (INDEPENDENT_AMBULATORY_CARE_PROVIDER_SITE_OTHER): Payer: Medicare Other

## 2023-09-12 DIAGNOSIS — I495 Sick sinus syndrome: Secondary | ICD-10-CM

## 2023-09-13 ENCOUNTER — Ambulatory Visit: Admitting: Urology

## 2023-09-13 ENCOUNTER — Telehealth: Payer: Self-pay

## 2023-09-13 ENCOUNTER — Other Ambulatory Visit: Payer: Self-pay

## 2023-09-13 VITALS — BP 139/79 | HR 85 | Wt 279.0 lb

## 2023-09-13 DIAGNOSIS — N35812 Other urethral bulbous stricture, male: Secondary | ICD-10-CM | POA: Diagnosis not present

## 2023-09-13 LAB — CUP PACEART REMOTE DEVICE CHECK
Battery Remaining Longevity: 145 mo
Battery Voltage: 3.17 V
Brady Statistic AP VP Percent: 38.5 %
Brady Statistic AP VS Percent: 0 %
Brady Statistic AS VP Percent: 61.49 %
Brady Statistic AS VS Percent: 0.01 %
Brady Statistic RA Percent Paced: 38.43 %
Brady Statistic RV Percent Paced: 99.99 %
Date Time Interrogation Session: 20250820001147
Implantable Lead Connection Status: 753985
Implantable Lead Connection Status: 753985
Implantable Lead Implant Date: 20180124
Implantable Lead Implant Date: 20250217
Implantable Lead Location: 753859
Implantable Lead Location: 753860
Implantable Lead Model: 3830
Implantable Lead Model: 5076
Implantable Pulse Generator Implant Date: 20250217
Lead Channel Impedance Value: 304 Ohm
Lead Channel Impedance Value: 342 Ohm
Lead Channel Impedance Value: 418 Ohm
Lead Channel Impedance Value: 608 Ohm
Lead Channel Pacing Threshold Amplitude: 0.5 V
Lead Channel Pacing Threshold Amplitude: 0.75 V
Lead Channel Pacing Threshold Pulse Width: 0.4 ms
Lead Channel Pacing Threshold Pulse Width: 0.4 ms
Lead Channel Sensing Intrinsic Amplitude: 26.25 mV
Lead Channel Sensing Intrinsic Amplitude: 27.5 mV
Lead Channel Sensing Intrinsic Amplitude: 3 mV
Lead Channel Sensing Intrinsic Amplitude: 3 mV
Lead Channel Setting Pacing Amplitude: 1.5 V
Lead Channel Setting Pacing Amplitude: 2 V
Lead Channel Setting Pacing Pulse Width: 0.4 ms
Lead Channel Setting Sensing Sensitivity: 0.9 mV
Zone Setting Status: 755011
Zone Setting Status: 755011

## 2023-09-13 NOTE — Telephone Encounter (Signed)
 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    Procedure:  CYSTOSCOPY WITH RETROGRADE URETHROGRAM AND CYSTOSCOPY WITH URETHRAL BALLOON DILATION USING OPTILUME  Date of procedure: 09/17/23   CHA2DS2-VASc Score = 4   This indicates a 4.8% annual risk of stroke. The patient's score is based upon: CHF History: 0 HTN History: 1 Diabetes History: 0 Stroke History: 0 Vascular Disease History: 1 Age Score: 2 Gender Score: 0      Hx of pulmonary embolism with repeat venous Dopplers 1/16 demonstrated small clots Managed with chronic apixaban    CrCl 75 ml/min Platelet count 175  Patient has not had an Afib/aflutter ablation within the last 3 months or DCCV within the last 30 days  Per office protocol, patient can hold Eliquis  for 2 days prior to procedure.    **This guidance is not considered finalized until pre-operative APP has relayed final recommendations.**

## 2023-09-13 NOTE — Telephone Encounter (Signed)
 Per Dr. Francisca,  Patient is to be scheduled for Cystoscopy with Retrograde Urethrogram and Cystoscopy with Urethral Balloon Dilation Using Optilume   [name] was contacted and possible surgical dates were discussed, Monday August 25th, 2025 was agreed upon for surgery.   Patient was directed to call [phone] between 1-3pm the day before surgery to find out surgical arrival time.  Instructions were given not to eat or drink from midnight on the night before surgery and have a driver for the day of surgery. On the surgery day patient was instructed to enter through the [facility] entrance of [facility] report the Same Day Surgery desk.   Pre-Admit Testing will be in contact via phone to set up an interview with the anesthesia team to review your history and medications prior to surgery.   Reminder of this information was sent via MyChart to the patient.

## 2023-09-13 NOTE — Progress Notes (Signed)
  Phone Number: [phone] for Surgical Coordinator Fax Number: [phone]  REQUEST FOR SURGICAL CLEARANCE      Date: Date: 09/13/23  [facility]  Surgeon: Dr. [name], MD     Date of Surgery: 09/17/2023  Operation: Cystoscopy with Retrograde Urethrogram and Cystoscopy with Urethral Balloon Dilation Using Optilume  Anesthesia Type: General   Diagnosis: Urethral Stricture  Patient Requires:   Cardiac / Vascular Clearance : Yes  Reason: Patient will need to hold Eliquis  for 2 days and will also need device clearance for Pacemaker   Risk Assessment:    Low   []       Moderate   []     High   []           This patient is optimized for surgery  YES []       NO   []    I recommend further assessment/workup prior to surgery. YES []      NO  []   Appointment scheduled for: _______________________   Further recommendations: ____________________________________     Physician Signature:__________________________________   Printed Name: ________________________________________   Date: _________________

## 2023-09-13 NOTE — Progress Notes (Signed)
 Surgical Physician Order Form [facility]  Dr. [name], MD  * Scheduling expectation : ASAP, ok to add 8/25  *Length of Case: 30 minutes  *Clearance needed: no  *Anticoagulation Instructions: Hold all anticoagulants  *Aspirin  Instructions: Hold Aspirin   *Post-op visit Date/Instructions:  1-3 day cath removal  *Diagnosis: Urethral stricture  *Procedure: Cystoscopy, retrograde urethrogram, Optilume balloon dilation   Additional orders: N/A  -Admit type: OUTpatient  -Anesthesia: General  -VTE Prophylaxis Standing Order SCD's       Other:   -Standing Lab Orders Per Anesthesia    Lab other: None  -Standing Test orders EKG/Chest x-ray per Anesthesia       Test other:   - Medications:  Ancef  2gm IV  -Other orders:  N/A

## 2023-09-13 NOTE — Patient Instructions (Signed)
 Urethral Stricture  Urethral stricture is when the tube that drains pee (urine) from the bladder out of the body (urethra) becomes too narrow. The urethra can become narrow because of scar tissue, infection, surgery, or an injury. This can make it difficult to pee (urinate). In females, the urethra opens above the vaginal opening. In males, the urethra opens at the tip of the penis, and the urethra is much longer than it is in females. Because of the length of the male urethra, urethral stricture is much more common in males. What are the causes? In males and females, common causes of urethral stricture include: Urinary tract infection (UTI). Sexually transmitted infection (STI). Using a soft tube in the urethra to drain pee from the bladder (urinary catheter). Urinary tract surgery. In males, common causes of urethral stricture include: A severe injury to the pelvis. Prostate surgery. Injury to the penis. In many cases, the cause of urethral stricture is not known. What increases the risk? You are more likely to develop this condition if you: Are male. Males who have had prostate surgery are at risk of developing this condition. Use a urinary catheter. Have had urinary tract surgery. What are the signs or symptoms? The main symptom of this condition is trouble peeing. This may cause decreased pee flow, dribbling, or spraying of pee. Other symptom of this condition may include: Frequent UTIs. Blood in the pee. Pain when peeing. Swelling of the penis in males. Not being able to pee. How is this diagnosed? This condition may be diagnosed based on: Your medical history and a physical exam. Tests of your pee to check for infection or bleeding. X-rays. Ultrasound. Retrograde urethrogram. With this test, a dye is injected into the urethra and then an X-ray is taken. Urethroscopy. This is when a thin tube with a light and camera on the end (urethroscope) is used to look at the urethra. A  CT scan or MRI. How is this treated? This condition is treated with surgery or other procedures. The type of surgery that you have depends on the severity of your condition. You may have: Urethral dilation. In this procedure, the narrow part of the urethra is stretched open (dilated) with dilating instruments or a small balloon. Urethrotomy. In this procedure, a urethroscope is placed into the urethra, and the narrow part of the urethra is cut open with a surgical blade or laser inserted through the urethroscope. Urethroplasty. In this procedure, an incision is made in the urethra and the narrow part is removed. Then, the urethra is reconstructed. Follow these instructions at home: Take over-the-counter and prescription medicines only as told by your health care provider. If you were prescribed antibiotics, take them as told by your provider. Do not stop using the antibiotic even if you start to feel better. Drink enough fluid to keep your pee pale yellow. Keep all follow-up visits. Your provider will check your healing and adjust your treatment plan as needed. Contact a health care provider if: You have frequent peeing or you are only peeing small amounts often. You feel the need to pee urgently. You have pain or burning when you pee. Your pee smells bad or unusual. Your pee is bloody or cloudy. You have pain in your lower abdomen or back. Your genital area is swollen, bruised, or discolored. This includes: The penis, scrotum, and inner thighs for males. The outer genital organs (vulva) and inner thighs for females. You have a fever. You develop swelling in your legs. Get help  right away if: You cannot pee. You have trouble breathing. These symptoms may be an emergency. Get help right away. Call 911. Do not wait to see if the symptoms will go away. Do not drive yourself to the hospital. This information is not intended to replace advice given to you by your health care provider. Make  sure you discuss any questions you have with your health care provider. Document Revised: 11/03/2021 Document Reviewed: 11/03/2021 Elsevier Patient Education  2024 [employer].

## 2023-09-13 NOTE — Progress Notes (Signed)
   [facility]- Surgical Posting Form  Surgery Date: Date: 09/17/2023  Surgeon: Dr. [name], MD  Inpt ( No  )   Outpt (Yes)   Obs ( No  )   Diagnosis: N35.812 Urethral Stricture  -CPT: 48389, [phone]  Surgery: Cystoscopy with Retrograde Urethrogram and Cystoscopy with Urethral Balloon Dilation Using Optilume  Stop Anticoagulations: Yes- Will need to hold Eliquis  for 2 days  Cardiac/Medical/Pulmonary Clearance needed: Yes  Clearance needed from Dr: Lesley  Clearance request sent on: Date: 09/13/23  *Orders entered into EPIC  Date: 09/13/23   *Case booked in EPIC  Date: 09/13/23  *Notified pt of Surgery: Date: 09/13/2023  PRE-OP UA & CX: no  *Placed into Prior Authorization Work [place] Date: 09/13/23  Assistant/laser/rep:No

## 2023-09-13 NOTE — Telephone Encounter (Signed)
 Patient has been scheduled med rec and consent done     Patient Consent for Virtual Visit         Nicholas [name] has provided verbal consent on 09/13/2023 for a virtual visit (video or telephone).   CONSENT FOR VIRTUAL VISIT FOR:  Nicholas [name]  By participating in this virtual visit I agree to the following:  I hereby voluntarily request, consent and authorize [facility] and its employed or contracted physicians, physician assistants, nurse practitioners or other licensed health care professionals (the Practitioner), to provide me with telemedicine health care services (the "Services) as deemed necessary by the treating Practitioner. I acknowledge and consent to receive the Services by the Practitioner via telemedicine. I understand that the telemedicine visit will involve communicating with the Practitioner through live audiovisual communication technology and the disclosure of certain medical information by electronic transmission. I acknowledge that I have been given the opportunity to request an in-person assessment or other available alternative prior to the telemedicine visit and am voluntarily participating in the telemedicine visit.  I understand that I have the right to withhold or withdraw my consent to the use of telemedicine in the course of my care at any time, without affecting my right to future care or treatment, and that the Practitioner or I may terminate the telemedicine visit at any time. I understand that I have the right to inspect all information obtained and/or recorded in the course of the telemedicine visit and may receive copies of available information for a reasonable fee.  I understand that some of the potential risks of receiving the Services via telemedicine include:  Delay or interruption in medical evaluation due to technological equipment failure or disruption; Information transmitted may not be sufficient (e.g. poor resolution of images) to allow for  appropriate medical decision making by the Practitioner; and/or  In rare instances, security protocols could fail, causing a breach of personal health information.  Furthermore, I acknowledge that it is my responsibility to provide information about my medical history, conditions and care that is complete and accurate to the best of my ability. I acknowledge that Practitioner's advice, recommendations, and/or decision may be based on factors not within their control, such as incomplete or inaccurate data provided by me or distortions of diagnostic images or specimens that may result from electronic transmissions. I understand that the practice of medicine is not an exact science and that Practitioner makes no warranties or guarantees regarding treatment outcomes. I acknowledge that a copy of this consent can be made available to me via my patient portal [facility] MyChart), or I can request a printed copy by calling the office of [facility].    I understand that my insurance will be billed for this visit.   I have read or had this consent read to me. I understand the contents of this consent, which adequately explains the benefits and risks of the Services being provided via telemedicine.  I have been provided ample opportunity to ask questions regarding this consent and the Services and have had my questions answered to my satisfaction. I give my informed consent for the services to be provided through the use of telemedicine in my medical care

## 2023-09-13 NOTE — Telephone Encounter (Signed)
   Pre-operative Risk Assessment    Patient Name: MARQUETTE BLODGETT  DOB: 04/05/1945 MRN: [id number]   Date of last office visit: 06/13/23 CHANTAL NEEDLE, NP Date of next office visit: NONE   Request for Surgical Clearance    Procedure:  CYSTOSCOPY WITH RETROGRADE URETHROGRAM AND CYSTOSCOPY WITH URETHRAL BALLOON DILATION USING OPTILUME  Date of Surgery:  Clearance 09/17/23                                Surgeon:  DR [name]  Surgeon's Group or Practice Name:  [facility] Phone number:  [phone] Fax number:  [phone]   Type of Clearance Requested:   - Medical  - Pharmacy:  Hold Apixaban  (Eliquis ) 2 DAYS    Type of Anesthesia:  General    Additional requests/questions:  WILL ALSO NEED DEVICE CLEARANCE FOR PACEMAKER  Signed, Lucie DELENA Ku   09/13/2023, 3:06 PM

## 2023-09-13 NOTE — Telephone Encounter (Signed)
Patient has been scheduled for televisit.

## 2023-09-13 NOTE — Telephone Encounter (Signed)
 Preop callback team,  Primary Cardiologist:None   Preoperative team, please contact this patient and set up a phone call appointment for 8/22 for further preoperative risk assessment. Please obtain consent and complete medication review. Thank you for your help.  I am routing request to device clinic for device clearance.   I also confirmed the patient resides in the state of [place] . As per [facility] Medical Board telemedicine laws, the patient must reside in the state in which the provider is licensed.   Rosaline EMERSON Bane, NP-C  09/13/2023, 4:23 PM [address], [address] [place], [place] [address] Office [phone] Fax [phone]

## 2023-09-13 NOTE — Progress Notes (Signed)
 Cystoscopy Procedure Note:  Indication: Dysuria  Comorbid 78 year old male with distant history of urethral stricture requiring DVIU in 2015, now with worsening dysuria despite negative culture.  Does have a history of recurrent UTI.  After informed consent and discussion of the procedure and its risks, Nicholas [name] was positioned and prepped in the standard fashion. Cystoscopy was performed with a flexible cystoscope.  The urethra was narrow, there was a 5 [place] bulbourethral stricture that appeared short.  Unable to access bladder secondary to stricture.   Findings: Bulbourethral stricture  -----------------------------------------------------------  Assessment and Plan: Comorbid 78 year old male with distant history of urethral stricture 2015, now with persistent dysuria despite negative urine cultures, recurrent UTI previously.  On cystoscopy today found to have recurrence of bulbar urethral stricture.  We discussed treatment options extensively, with his comorbidities he was interested in Optilume balloon dilation, we reviewed 80% success rate, risk of bleeding, infection, dysuria, recurrence, potential need for additional treatments in the future, potential additional pathology at time of cystoscopy including BPH or bladder lesions.  Schedule retrograde urethrogram, cystoscopy, Optilume balloon dilation  Nicholas Burnet, MD 09/13/2023

## 2023-09-14 ENCOUNTER — Encounter: Payer: Self-pay | Admitting: Nurse Practitioner

## 2023-09-14 ENCOUNTER — Ambulatory Visit: Attending: Internal Medicine | Admitting: Nurse Practitioner

## 2023-09-14 ENCOUNTER — Encounter
Admission: RE | Admit: 2023-09-14 | Discharge: 2023-09-14 | Disposition: A | Discharge status: Home / Self Care | Source: Ambulatory Visit | Attending: Urology | Admitting: Urology

## 2023-09-14 ENCOUNTER — Other Ambulatory Visit: Payer: Self-pay

## 2023-09-14 ENCOUNTER — Encounter: Payer: Self-pay | Admitting: Cardiology

## 2023-09-14 VITALS — Ht 70.0 in | Wt 279.0 lb

## 2023-09-14 DIAGNOSIS — E1169 Type 2 diabetes mellitus with other specified complication: Secondary | ICD-10-CM

## 2023-09-14 DIAGNOSIS — Z01810 Encounter for preprocedural cardiovascular examination: Secondary | ICD-10-CM

## 2023-09-14 HISTORY — DX: Sleep apnea, unspecified: G47.30

## 2023-09-14 HISTORY — DX: Orthostatic hypotension: I95.1

## 2023-09-14 HISTORY — DX: Presence of cardiac pacemaker: Z95.0

## 2023-09-14 HISTORY — DX: Polyneuropathy, unspecified: G62.9

## 2023-09-14 HISTORY — DX: Restless legs syndrome: G25.81

## 2023-09-14 NOTE — Patient Instructions (Addendum)
 Your procedure is scheduled on: Monday 09/17/23  Report to the Registration Desk on the 1st floor of the [facility]. Arrive at 4:15 pm  REMEMBER: Instructions that are not followed completely may result in serious medical risk, up to and including death; or upon the discretion of your surgeon and anesthesiologist your surgery may need to be rescheduled.  Do not eat food or drink fluids after midnight the night before surgery.  No gum chewing or hard candies.   One week prior to surgery: Stop Anti-inflammatories (NSAIDS) such as Advil, Aleve, Ibuprofen, Motrin, Naproxen, Naprosyn and Aspirin  based products such as Excedrin, Goody's Powder, BC Powder. Stop ANY OVER THE COUNTER supplements until after surgery. Ascorbic Acid  (VITAMIN C ) 1000 MG  calcium -vitamin D  (OSCAL WITH D)  Cranberry 500 MG   Magnesium  400 MG  Multiple Vitamin (MULTIVITAMIN WITH MINERALS) TABS  Omega-3 Fatty Acids (FISH OIL) 1200 MG    You may however, continue to take Tylenol  if needed for pain up until the day of surgery.   Stop apixaban  (ELIQUIS ) 5 MG TABS tablet 2 days prior to surgery (take last dose Friday 09/14/23)  Continue taking all of your other prescription medications up until the day of surgery.  ON THE DAY OF SURGERY ONLY TAKE THESE MEDICATIONS WITH SIPS OF WATER:  midodrine  (PROAMATINE ) 5 MG  pantoprazole  (PROTONIX ) 20 MG  rOPINIRole  (REQUIP ) 0.25 MG  venlafaxine  XR (EFFEXOR -XR) 75 MG  HYDROcodone -acetaminophen  (NORCO/VICODIN) 5-325 MG   Use inhalers on the day of surgery and bring to the hospital. albuterol  (VENTOLIN  HFA) 108 (90 Base) MCG/ACT inhaler   No Alcohol for 24 hours before or after surgery.  No Smoking including e-cigarettes for 24 hours before surgery.  No chewable tobacco products for at least 6 hours before surgery.  No nicotine patches on the day of surgery.  Do not use any recreational drugs for at least a week (preferably 2 weeks) before your surgery.  Please be  advised that the combination of cocaine and anesthesia may have negative outcomes, up to and including death. If you test positive for cocaine, your surgery will be cancelled.  On the morning of surgery brush your teeth with toothpaste and water, you may rinse your mouth with mouthwash if you wish. Do not swallow any toothpaste or mouthwash.  Use CHG Soap or wipes as directed on instruction sheet.  Do not wear jewelry, make-up, hairpins, clips or nail polish.  For welded (permanent) jewelry: bracelets, anklets, waist bands, etc.  Please have this removed prior to surgery.  If it is not removed, there is a chance that hospital personnel will need to cut it off on the day of surgery.  Do not wear lotions, powders, or perfumes.   Do not shave body hair from the neck down 48 hours before surgery.  Contact lenses, hearing aids and dentures may not be worn into surgery.  Do not bring valuables to the hospital. [facility] is not responsible for any missing/lost belongings or valuables.   Bring your C-PAP to the hospital in case you may have to spend the night.   Notify your doctor if there is any change in your medical condition (cold, fever, infection).  Wear comfortable clothing (specific to your surgery type) to the hospital.  After surgery, you can help prevent lung complications by doing breathing exercises.  Take deep breaths and cough every 1-2 hours. Your doctor may order a device called an Incentive Spirometer to help you take deep breaths. When coughing or sneezing,  hold a pillow firmly against your incision with both hands. This is called "splinting." Doing this helps protect your incision. It also decreases belly discomfort.  If you are being admitted to the hospital overnight, leave your suitcase in the car. After surgery it may be brought to your room.  In case of increased patient census, it may be necessary for you, the patient, to continue your postoperative care in the  Same Day Surgery department.  If you are being discharged the day of surgery, you will not be allowed to drive home. You will need a responsible individual to drive you home and stay with you for 24 hours after surgery.   If you are taking public transportation, you will need to have a responsible individual with you.  Please call the Pre-admissions Testing Dept. at [phone] if you have any questions about these instructions.  Surgery Visitation Policy:  Patients having surgery or a procedure may have two visitors.  Children under the age of 30 must have an adult with them who is not the patient.  Inpatient Visitation:    Visiting hours are 7 a.m. to 8 p.m. Up to four visitors are allowed at one time in a patient room. The visitors may rotate out with other people during the day.  One visitor age 24 or older may stay with the patient overnight and must be in the room by 8 p.m.   Merchandiser, retail to address health-related social needs:  [email or web address]

## 2023-09-14 NOTE — Progress Notes (Signed)
 PERIOPERATIVE PRESCRIPTION FOR IMPLANTED CARDIAC DEVICE PROGRAMMING  Patient Information: Name:  Nicholas [name]  DOB:  [birth year]  MRN:  [id number]    Procedure:  CYSTOSCOPY WITH RETROGRADE URETHROGRAM AND CYSTOSCOPY WITH URETHRAL BALLOON DILATION USING OPTILUME   Date of Surgery:  Clearance 09/17/23                                  Surgeon:  DR [name]  Surgeon's Group or Practice Name:  [facility] Phone number:  [phone] Fax number:  [phone] Device Information:  Clinic EP Physician:  Dr. Fonda Kitty   Device Type:  Pacemaker Manufacturer and Phone #:  [employer]tr[employer]: [id number] Pacemaker Dependent?:  Yes.   Date of Last Device Check:  09/12/2023  Normal Device Function?:  Yes.    Electrophysiologist's Recommendations:  Have magnet available. Provide continuous ECG monitoring when magnet is used or reprogramming is to be performed.  Procedure may interfere with device function.  Magnet should be placed over device during procedure.  Per Device Clinic Standing Orders, Almarie ONEIDA Shutter, RN  8:43 AM 09/14/2023

## 2023-09-14 NOTE — Progress Notes (Signed)
 Virtual Visit via Telephone Note   Because of [place] co-morbid illnesses, he is at least at moderate risk for complications without adequate follow up.  This format is felt to be most appropriate for this patient at this time.  Due to technical limitations with video connection (technology), today's appointment will be conducted as an audio only telehealth visit, and Nicholas [name] verbally agreed to proceed in this manner.   All issues noted in this document were discussed and addressed.  No physical exam could be performed with this format.  Evaluation Performed:  Preoperative cardiovascular risk assessment _____________   Date:  09/14/2023   Patient ID:  Nicholas [name], DOB 1945/04/29, MRN [id number] Patient Location:  Home Provider location:   Office  Primary Care Provider:  Valerio Melanie ONEIDA, NP Primary Cardiologist:  None  Chief Complaint / Patient Profile   78 y.o. 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 Jairo on 09/17/23 and presents today for telephonic preoperative cardiovascular risk assessment.  History of Present Illness    Nicholas [name] is a 78 y.o. male who presents via audio/video conferencing for a telehealth visit today.  Pt was last seen in cardiology clinic on 06/13/23 by Suzann Riddle, NP.  At that time ORVIL FARAONE was doing well.  The patient is now pending procedure as outlined above. Since his last visit, he denies chest pain, shortness of breath, lower extremity edema, fatigue, palpitations, melena, hematuria, hemoptysis, diaphoresis, weakness, presyncope, syncope, orthopnea, and PND. He has some chest discomfort when walking farther distances, but it resolves quickly  when he slows down. He denies associated symptoms of SOB, n/v, diaphoresis with chest discomfort. He is able to achieve > 4 METS activity without concerning cardiac symptoms.    Past Medical History    Past Medical History:  Diagnosis Date   Abdominal pain, chronic, right lower quadrant 12/13/2020   Anterior urethral stricture    Anxiety    Arthritis    a. knees, hips, hands;  b. 11/2013 s/p L TKA @ [facility].   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 Dr Fernande   Depression    Diabetes mellitus without complication (HCC)    DVT (deep venous thrombosis) (HCC)    Erosive esophagitis    GERD (gastroesophageal reflux disease)    Gross hematuria    Hyperlipemia    Hypertension    borderline   Internal hemorrhoids    Phimosis    Pulmonary embolism (HCC)    Past Surgical History:  Procedure Laterality Date   ATRIAL FIBRILLATION ABLATION N/A 05/17/2020   Procedure: ATRIAL FIBRILLATION ABLATION;  Surgeon: Cindie Ole ONEIDA, MD;  Location: [facility];  Service: Cardiovascular;  Laterality: N/A;   BUNIONECTOMY Bilateral 01/06/2015   Procedure: ROMAYNE;  Surgeon: Kayla Pinal, MD;  Location: [facility] ORS;  Service: Orthopedics;  Laterality: Bilateral;   CARDIAC CATHETERIZATION  ~ 2005   once   CATARACT EXTRACTION W/ INTRAOCULAR LENS  IMPLANT, BILATERAL Bilateral ~ 2010   COLONOSCOPY WITH PROPOFOL  N/A 12/07/2015   Procedure: COLONOSCOPY WITH PROPOFOL ;  Surgeon: Gladis RAYMOND Mariner, MD;  Location: [facility] ENDOSCOPY;  Service: Endoscopy;  Laterality: N/A;   COLONOSCOPY WITH PROPOFOL  N/A 11/26/2020   Procedure: COLONOSCOPY WITH PROPOFOL ;  Surgeon: Unk Corinn Skiff, MD;  Location: [facility] ENDOSCOPY;  Service: Gastroenterology;  Laterality: N/A;   EP IMPLANTABLE DEVICE N/A 02/16/2016   MDT dual chamber (His Bundle) pacemaker implanted by Dr Fernande for  intermittent complete heart block   ESOPHAGOGASTRODUODENOSCOPY (EGD) WITH PROPOFOL  N/A 12/07/2015   Procedure: ESOPHAGOGASTRODUODENOSCOPY (EGD) WITH PROPOFOL ;  Surgeon: Gladis RAYMOND Mariner, MD;  Location: [facility] ENDOSCOPY;  Service: Endoscopy;  Laterality: N/A;   ESOPHAGOGASTRODUODENOSCOPY (EGD) WITH PROPOFOL  N/A 05/17/2017   Procedure: ESOPHAGOGASTRODUODENOSCOPY (EGD) WITH PROPOFOL ;  Surgeon: Mariner Gladis RAYMOND, MD;  Location: [facility] ENDOSCOPY;  Service: Endoscopy;  Laterality: N/A;   HAMMER TOE SURGERY Bilateral 01/06/2015   Procedure: HAMMER TOE CORRECTION;  Surgeon: Kayla Pinal, MD;  Location: [facility] ORS;  Service: Orthopedics;  Laterality: Bilateral;   KNEE CARTILAGE SURGERY Left 1965   football injury   LEAD EXTRACTION N/A 03/12/2023   Procedure: LEAD EXTRACTION;  Surgeon: Cindie Ole DASEN, MD;  Location: [facility] INVASIVE CV LAB;  Service: Cardiovascular;  Laterality: N/A;   Left Total Knee Arthroplasty     a. 11/2013 [facility].   PACEMAKER IMPLANT  2017   PACEMAKER IMPLANT N/A 03/12/2023   Procedure: PACEMAKER IMPLANT;  Surgeon: Cindie Ole DASEN, MD;  Location: [facility] INVASIVE CV LAB;  Service: Cardiovascular;  Laterality: N/A;   PILONIDAL CYST EXCISION  1970's   TOTAL HIP ARTHROPLASTY Right 2004   TOTAL HIP ARTHROPLASTY Left 2006   TOTAL KNEE ARTHROPLASTY Right    UPPER GI ENDOSCOPY      Allergies  No Known Allergies  Home Medications    Prior to Admission medications   Medication Sig Start Date End Date Taking? Authorizing Provider  acetaminophen  (TYLENOL ) 650 MG CR tablet Take 650 mg by mouth every 8 (eight) hours as needed for pain.    [provider]  albuterol  (VENTOLIN  HFA) 108 (90 Base) MCG/ACT inhaler INHALE 2 PUFFS BY MOUTH EVERY 6 HOURS AS NEEDED 12/29/22   Cannady, Jolene T, NP  apixaban  (ELIQUIS ) 5 MG TABS tablet Take 1 tablet (5 mg total) by mouth 2 (two) times daily. 04/19/23   Fernande Elspeth BROCKS, MD  Ascorbic Acid  (VITAMIN C ) 1000 MG tablet Take 1,000 mg by mouth daily.     [provider]  Blood Glucose Monitoring Suppl (ONETOUCH VERIO) w/Device KIT Use to check blood sugar 3 times a day and document results, bring to appointments.  Goal is <130 fasting blood sugar and <180 two hours after meals. 03/23/21   Cannady, Jolene T, NP  calcium -vitamin D  (OSCAL WITH D) 500-5 MG-MCG tablet Take 1 tablet by mouth daily with breakfast.    [provider]  Cranberry 500 MG CAPS Take 500 mg by mouth daily.    [provider]  gabapentin  (NEURONTIN ) 600 MG tablet TAKE 1 TABLET BY MOUTH EVERYDAY AT BEDTIME 09/26/22   Cannady, Jolene T, NP  glucose blood test strip Use to check blood sugar 3 times a day and document results, bring to appointments.  Goal is <130 fasting blood sugar and <180 two hours after meals. 03/23/21   Cannady, Jolene T, NP  HYDROcodone -acetaminophen  (NORCO/VICODIN) 5-325 MG tablet Take 1 tablet by mouth every 6 (six) hours as needed for moderate pain (pain score 4-6) or severe pain (pain score 7-10). 09/14/23 10/14/23  Myra Lynwood MATSU, MD  HYDROcodone -acetaminophen  (NORCO/VICODIN) 5-325 MG tablet Take 1 tablet  by mouth every 6 (six) hours as needed for moderate pain (pain score 4-6) or severe pain (pain score 7-10). 10/14/23 11/13/23  Myra Lynwood MATSU, MD  Lancets [facility] ULTRASOFT) lancets Use to check blood sugar 3 times a day and document results, bring to appointments.  Goal is <130 fasting blood sugar and <180 two hours after meals. 03/23/21   Cannady, Jolene T, NP  LORazepam  (ATIVAN ) 1 MG tablet TAKE 1 TABLET BY MOUTH EVERYDAY AT BEDTIME 07/14/23   Cannady, Jolene T, NP  Magnesium  400 MG TABS Take 400 mg by mouth daily.     [provider]  MELATONIN PO Take 10 mg by mouth at bedtime.    [provider]  midodrine  (PROAMATINE ) 5 MG tablet Take 10 mg in the morning, take 5 mg in the afternoon, and 5 mg in the evening 04/19/23   Fernande Elspeth BROCKS, MD  Multiple Vitamin (MULTIVITAMIN WITH MINERALS) TABS tablet Take 1 tablet by mouth  daily. Centrum Silver    [provider]  NON FORMULARY Pt uses a cpap nightly    [provider]  nystatin  cream (MYCOSTATIN ) Apply 1 Application topically 2 (two) times daily. 11/22/22   Johnson, Megan P, DO  Omega-3 Fatty Acids (FISH OIL) 1200 MG CAPS Take 1,200 mg by mouth daily.    [provider]  oxymetazoline  (AFRIN) 0.05 % nasal spray Place 2 sprays into both nostrils at bedtime.    [provider]  pantoprazole  (PROTONIX ) 20 MG tablet Take 1 tablet (20 mg total) by mouth daily. 07/05/23   Cannady, Jolene T, NP  potassium chloride  SA (KLOR-CON  M) 20 MEQ tablet Take 2 tablets (40 meq) with Torsemide  Patient taking differently: Take 40 mEq by mouth daily as needed (when taking torsemide ). 04/26/22   Cannady, Jolene T, NP  QUEtiapine  Fumarate (SEROQUEL  XR) 150 MG 24 hr tablet Take 1 tablet (150 mg total) by mouth at bedtime. 04/02/23   Cannady, Jolene T, NP  rOPINIRole  (REQUIP ) 0.25 MG tablet Take 0.25 mg by mouth 4 (four) times daily. 03/23/20   [provider]  rosuvastatin  (CRESTOR ) 40 MG tablet Take 1 tablet (40 mg total) by mouth daily. 04/02/23   Cannady, Jolene T, NP  solifenacin  (VESICARE ) 10 MG tablet Take 1 tablet (10 mg total) by mouth daily. 07/05/23   Cannady, Jolene T, NP  tiZANidine  (ZANAFLEX ) 4 MG tablet Take 1 tablet (4 mg total) by mouth every 6 (six) hours as needed for muscle spasms. 12/21/20   Cannady, Jolene T, NP  torsemide  (DEMADEX ) 20 MG tablet TAKE 2 TABLETS (40 MG TOTAL) BY MOUTH DAILY AS NEEDED (FLUID RETENTION). 06/30/22   Fernande Elspeth BROCKS, MD  trimethoprim  (TRIMPEX ) 100 MG tablet Take 1 tablet (100 mg total) by mouth daily. Patient not taking: Reported on 09/13/2023 01/29/23   Vaillancourt, Samantha, PA-C  venlafaxine  XR (EFFEXOR -XR) 75 MG 24 hr capsule Take 3 capsules (225 mg total) by mouth daily. 04/02/23   Valerio Melanie DASEN, NP    Physical Exam    Vital Signs:  ABDULAI BLAYLOCK does not have vital signs available for review  today.  Given telephonic nature of communication, physical exam is limited. AAOx3. NAD. Normal affect.  Speech and respirations are unlabored.  Accessory Clinical Findings    None  Assessment & Plan    1.  Preoperative Cardiovascular Risk Assessment: According to the Revised Cardiac Risk Index (RCRI), his Perioperative Risk of Major Cardiac Event is (%): 0.4. His Functional Capacity in METs is: 8.97  according to the Duke Activity Status Index (DASI). The patient is doing well from a cardiac perspective. Therefore, based on ACC/AHA guidelines, the patient would be at acceptable risk for the planned procedure without further cardiovascular testing.   The patient was advised that if he develops new symptoms prior to surgery to contact our office to arrange for a follow-up visit, and he verbalized understanding.  Per office protocol, patient can hold Eliquis  for 2 days prior to procedure and should resume as soon as he hemodynamically stable post procedure.  A copy of this note will be routed to requesting surgeon.  Time:   Today, I have spent 10 minutes with the patient with telehealth technology discussing medical history, symptoms, and management plan.     Rosaline EMERSON Bane, NP-C  09/14/2023, 10:39 AM [address] [place], [place] [address] Office [phone] Fax [phone]

## 2023-09-17 ENCOUNTER — Ambulatory Visit

## 2023-09-17 ENCOUNTER — Encounter
Admission: RE | Disposition: A | Discharge status: Home / Self Care | Payer: Self-pay | Source: Home / Self Care | Attending: Urology

## 2023-09-17 ENCOUNTER — Ambulatory Visit: Admitting: Certified Registered"

## 2023-09-17 ENCOUNTER — Encounter: Payer: Self-pay | Admitting: Urology

## 2023-09-17 ENCOUNTER — Ambulatory Visit
Admission: RE | Admit: 2023-09-17 | Discharge: 2023-09-17 | Disposition: A | Discharge status: Home / Self Care | Attending: Urology | Admitting: Urology

## 2023-09-17 ENCOUNTER — Ambulatory Visit: Payer: Self-pay | Admitting: Urgent Care

## 2023-09-17 ENCOUNTER — Other Ambulatory Visit: Payer: Self-pay

## 2023-09-17 DIAGNOSIS — E1169 Type 2 diabetes mellitus with other specified complication: Secondary | ICD-10-CM

## 2023-09-17 DIAGNOSIS — E119 Type 2 diabetes mellitus without complications: Secondary | ICD-10-CM | POA: Insufficient documentation

## 2023-09-17 DIAGNOSIS — I10 Essential (primary) hypertension: Secondary | ICD-10-CM | POA: Insufficient documentation

## 2023-09-17 DIAGNOSIS — N35912 Unspecified bulbous urethral stricture, male: Secondary | ICD-10-CM | POA: Diagnosis not present

## 2023-09-17 DIAGNOSIS — N35812 Other urethral bulbous stricture, male: Secondary | ICD-10-CM

## 2023-09-17 DIAGNOSIS — K219 Gastro-esophageal reflux disease without esophagitis: Secondary | ICD-10-CM | POA: Insufficient documentation

## 2023-09-17 DIAGNOSIS — Z7722 Contact with and (suspected) exposure to environmental tobacco smoke (acute) (chronic): Secondary | ICD-10-CM | POA: Insufficient documentation

## 2023-09-17 DIAGNOSIS — G4730 Sleep apnea, unspecified: Secondary | ICD-10-CM | POA: Diagnosis not present

## 2023-09-17 HISTORY — PX: CYSTOSCOPY WITH RETROGRADE URETHROGRAM: SHX6309

## 2023-09-17 LAB — GLUCOSE, CAPILLARY: Glucose-Capillary: 129 mg/dL — ABNORMAL HIGH (ref 70–99)

## 2023-09-17 SURGERY — CYSTOSCOPY WITH RETROGRADE URETHROGRAM
Anesthesia: General | Site: Urethra

## 2023-09-17 MED ORDER — DEXAMETHASONE SODIUM PHOSPHATE 10 MG/ML IJ SOLN
INTRAMUSCULAR | Status: DC | PRN
  Administered 2023-09-17: 5 mg via INTRAVENOUS

## 2023-09-17 MED ORDER — FENTANYL CITRATE (PF) 100 MCG/2ML IJ SOLN: 25.0000 ug | INTRAMUSCULAR | Status: DC | PRN

## 2023-09-17 MED ORDER — CHLORHEXIDINE GLUCONATE 0.12 % MT SOLN
OROMUCOSAL | Status: AC
  Filled 2023-09-17: qty 15

## 2023-09-17 MED ORDER — SUCCINYLCHOLINE CHLORIDE 200 MG/10ML IV SOSY
PREFILLED_SYRINGE | INTRAVENOUS | Status: DC | PRN
  Administered 2023-09-17: 100 mg via INTRAVENOUS

## 2023-09-17 MED ORDER — PROPOFOL 10 MG/ML IV BOLUS
INTRAVENOUS | Status: DC | PRN
  Administered 2023-09-17: 200 mg via INTRAVENOUS

## 2023-09-17 MED ORDER — STERILE WATER FOR IRRIGATION IR SOLN
Status: DC | PRN
  Administered 2023-09-17: 3000 mL

## 2023-09-17 MED ORDER — GLYCOPYRROLATE 0.2 MG/ML IJ SOLN
INTRAMUSCULAR | Status: DC | PRN
  Administered 2023-09-17: .2 mg via INTRAVENOUS

## 2023-09-17 MED ORDER — ORAL CARE MOUTH RINSE: 15.0000 mL | Freq: Once | OROMUCOSAL | Status: AC

## 2023-09-17 MED ORDER — SUGAMMADEX SODIUM 200 MG/2ML IV SOLN
INTRAVENOUS | Status: DC | PRN
  Administered 2023-09-17: 200 mg via INTRAVENOUS

## 2023-09-17 MED ORDER — ROCURONIUM BROMIDE 100 MG/10ML IV SOLN
INTRAVENOUS | Status: DC | PRN
  Administered 2023-09-17: 30 mg via INTRAVENOUS

## 2023-09-17 MED ORDER — CEFAZOLIN SODIUM-DEXTROSE 2-4 GM/100ML-% IV SOLN
2.0000 g | INTRAVENOUS | Status: AC
  Administered 2023-09-17: 1 g via INTRAVENOUS
  Administered 2023-09-17: 2 g via INTRAVENOUS

## 2023-09-17 MED ORDER — ACETAMINOPHEN 10 MG/ML IV SOLN
INTRAVENOUS | Status: DC | PRN
  Administered 2023-09-17: 1000 mg via INTRAVENOUS

## 2023-09-17 MED ORDER — SODIUM CHLORIDE 0.9 % IV SOLN: INTRAVENOUS | Status: DC

## 2023-09-17 MED ORDER — OXYCODONE HCL 5 MG PO TABS: 5.0000 mg | ORAL_TABLET | Freq: Once | ORAL | Status: DC | PRN

## 2023-09-17 MED ORDER — CEFAZOLIN SODIUM-DEXTROSE 2-4 GM/100ML-% IV SOLN
INTRAVENOUS | Status: AC
  Filled 2023-09-17: qty 100

## 2023-09-17 MED ORDER — IOHEXOL 180 MG/ML  SOLN
INTRAMUSCULAR | Status: DC | PRN
  Administered 2023-09-17: 20 mL

## 2023-09-17 MED ORDER — ONDANSETRON HCL 4 MG/2ML IJ SOLN
INTRAMUSCULAR | Status: DC | PRN
  Administered 2023-09-17 (×2): 4 mg via INTRAVENOUS

## 2023-09-17 MED ORDER — CHLORHEXIDINE GLUCONATE 0.12 % MT SOLN
15.0000 mL | Freq: Once | OROMUCOSAL | Status: AC
  Administered 2023-09-17: 15 mL via OROMUCOSAL

## 2023-09-17 MED ORDER — LIDOCAINE HCL (CARDIAC) PF 100 MG/5ML IV SOSY
PREFILLED_SYRINGE | INTRAVENOUS | Status: DC | PRN
  Administered 2023-09-17: 100 mg via INTRAVENOUS

## 2023-09-17 MED ORDER — OXYCODONE HCL 5 MG/5ML PO SOLN: 5.0000 mg | Freq: Once | ORAL | Status: DC | PRN

## 2023-09-17 SURGICAL SUPPLY — 16 items
BAG URINE DRAIN 2000ML AR STRL (UROLOGICAL SUPPLIES) ×1 IMPLANT
BALLOON OPTILUME DCB 24X5X75 (BALLOONS) IMPLANT
BALLOON OPTILUME DCB 30X3X75 (BALLOONS) IMPLANT
BALLOON OPTILUME DCB 30X5X75 (BALLOONS) IMPLANT
BALLOON URETL DIL 8X4 (BALLOONS) IMPLANT
CATH FOL 2WAY LX 16X5 (CATHETERS) IMPLANT
CATH FOLEY 2W COUNCIL 20FR 5CC (CATHETERS) ×1 IMPLANT
DILATOR UROMAX ULTRA (MISCELLANEOUS) IMPLANT
ELECTRODE REM PT RTRN 9FT ADLT (ELECTROSURGICAL) IMPLANT
GLOVE BIOGEL PI IND STRL 7.5 (GLOVE) ×1 IMPLANT
GOWN STRL REUS W/ TWL XL LVL3 (GOWN DISPOSABLE) ×1 IMPLANT
GUIDEWIRE STR DUAL SENSOR (WIRE) ×1 IMPLANT
PACK CYSTO AR (MISCELLANEOUS) ×1 IMPLANT
SET CYSTO W/LG BORE CLAMP LF (SET/KITS/TRAYS/PACK) ×1 IMPLANT
SYR TOOMEY 50ML (SYRINGE) ×1 IMPLANT
WATER STERILE IRR 500ML POUR (IV SOLUTION) ×1 IMPLANT

## 2023-09-17 NOTE — Anesthesia Procedure Notes (Signed)
 Procedure Name: Intubation Date/Time: 09/17/2023 1:43 PM  Performed by: Ledora Duncan, CRNAPre-anesthesia Checklist: Patient identified, Emergency Drugs available, Suction available and Patient being monitored Patient Re-evaluated:Patient Re-evaluated prior to induction Oxygen Delivery Method: Circle system utilized Preoxygenation: Pre-oxygenation with 100% oxygen Induction Type: IV induction Ventilation: Mask ventilation without difficulty Laryngoscope Size: McGrath and 3 Grade View: Grade I Tube type: Oral Tube size: 7.0 mm Number of attempts: 1 Airway Equipment and Method: Stylet Placement Confirmation: ETT inserted through vocal cords under direct vision, positive ETCO2 and breath sounds checked- equal and bilateral Secured at: 21 cm Tube secured with: Tape Dental Injury: Teeth and Oropharynx as per pre-operative assessment

## 2023-09-17 NOTE — Anesthesia Postprocedure Evaluation (Signed)
 Anesthesia Post Note  Patient: Nicholas [name]  Procedure(s) Performed: CYSTOSCOPY WITH RETROGRADE URETHROGRAM (Urethra) CYSTOURETHROSCOPY, WITH URETHRAL STRICTURE DILATION USING DRUG-COATED BALLOON (Urethra)  Patient location during evaluation: PACU Anesthesia Type: General Level of consciousness: awake Pain management: pain level controlled Vital Signs Assessment: post-procedure vital signs reviewed and stable Respiratory status: spontaneous breathing Cardiovascular status: blood pressure returned to baseline Anesthetic complications: no   No notable events documented.   Last Vitals:  Vitals:   09/17/23 1430 09/17/23 1435  BP: (!) 161/81   Pulse: 81 79  Resp: 19 13  Temp:    SpO2: 92% 95%    Last Pain:  Vitals:   09/17/23 1420  TempSrc:   PainSc: 0-No pain                 VAN STAVEREN,Lyle Leisner

## 2023-09-17 NOTE — Op Note (Signed)
 Date of procedure: 09/17/23  Preoperative diagnosis:  Urethral stricture  Postoperative diagnosis:  Same  Procedure: Retrograde urethrogram Optilume urethral balloon dilation Cystoscopy  Surgeon: [name], MD  Anesthesia: General  Complications: None  Intraoperative findings:  1cm bulbourethral stricture on retrograde urethrogram Diffusely narrow urethra but able to accommodate 21 [place] scope up to the bulbar urethral stricture Uncomplicated balloon dilation of stricture, small prostate, normal bladder  EBL: Minimal  Specimens: None  Drains: 18 French council Foley  Indication: Nicholas [name] is a 78 y.o. patient with dysuria and recurrent UTIs, history of urethral stricture who is found have recent recurrence of a small bulbar urethral stricture and opted for Optilume balloon dilation.  After reviewing the management options for treatment, they elected to proceed with the above surgical procedure(s). We have discussed the potential benefits and risks of the procedure, side effects of the proposed treatment, the likelihood of the patient achieving the goals of the procedure, and any potential problems that might occur during the procedure or recuperation. Informed consent has been obtained.  Description of procedure:  The patient was taken to the operating room and general anesthesia was induced. SCDs were placed for DVT prophylaxis.  Preoperative antibiotics(Ancef ) were given, and a timeout was performed.  The patient was placed in supine position with a bump under the left hip, and a retrograde urethrogram showed a 1 cm stricture in the bulbourethra.   The patient was placed in the low lithotomy position, prepped and draped in the usual sterile fashion.  A 21 French rigid cystoscope was used to intubate the urethra and this was followed proximally towards the bladder, urethra was narrow but did accommodate the scope.  There was a pinpoint bulbar urethral stricture that  appeared short and relatively thin.  A sensor wire was advanced through the stricture into the bladder.  A 24 French by 4 cm UroMax dilating balloon was advanced over the location of the stricture and inflated to a pressure of 20 ATM.  This was removed, and an Optilume 30 [place] by 3 cm balloon was advanced over the location of the stricture and inflated to a pressure of 8 ATM and allowed to remain in place for 5 minutes.  Balloon was deflated and removed.  An 62 [place] passed over the wire into the bladder with return of clear urine, and 10 mL replaced in balloon.  Catheter was connected to drainage and this concluded our procedure.  Disposition: Stable to PACU  Plan: Keep Foley removal as scheduled Wednesday RTC PA symptom check 3 months  [name], MD

## 2023-09-17 NOTE — Anesthesia Preprocedure Evaluation (Signed)
 Anesthesia Evaluation  Patient identified by MRN, date of birth, ID band Patient awake    Reviewed: Allergy & Precautions, NPO status , Patient's Chart, lab work & pertinent test results  History of Anesthesia Complications Negative for: history of anesthetic complications  Airway Mallampati: III  TM Distance: <3 FB Neck ROM: full    Dental  (+) Chipped   Pulmonary neg shortness of breath, sleep apnea    Pulmonary exam normal        Cardiovascular Exercise Tolerance: Good hypertension, (-) angina (-) Past MI and (-) DOE Normal cardiovascular exam+ dysrhythmias + pacemaker      Neuro/Psych  PSYCHIATRIC DISORDERS       Neuromuscular disease    GI/Hepatic Neg liver ROS,GERD  Controlled,,  Endo/Other  diabetes, Type 2    Renal/GU Renal disease     Musculoskeletal   Abdominal   Peds  Hematology negative hematology ROS (+)   Anesthesia Other Findings Patient has cardiac clearance for this procedure.   Past Medical History: 12/13/2020: Abdominal pain, chronic, right lower quadrant No date: Anterior urethral stricture No date: Anxiety No date: Arthritis     Comment:  a. knees, hips, hands;  b. 11/2013 s/p L TKA @ [facility]. No date: Bile reflux gastritis No date: Bulging lumbar disc No date: BXO (balanitis xerotica obliterans) No date: Complete heart block (HCC)     Comment:  a. s/p MDT dual chamber (His bundle) pacemaker 01/2016 Dr              Fernande No date: Depression No date: Diabetes mellitus without complication (HCC) No date: DVT (deep venous thrombosis) (HCC) No date: Erosive esophagitis No date: GERD (gastroesophageal reflux disease) No date: Gross hematuria No date: Hyperlipemia No date: Hypertension     Comment:  borderline No date: Internal hemorrhoids No date: Orthostatic hypotension No date: Phimosis No date: Presence of permanent cardiac pacemaker No date: Pulmonary embolism (HCC) No date: RLS  (restless legs syndrome) No date: Sensory neuropathy No date: Sleep apnea  Past Surgical History: 05/17/2020: ATRIAL FIBRILLATION ABLATION; N/A     Comment:  Procedure: ATRIAL FIBRILLATION ABLATION;  Surgeon:               Cindie Ole DASEN, MD;  Location: [facility];                Service: Cardiovascular;  Laterality: N/A; 01/06/2015: ROMAYNE; Bilateral     Comment:  Procedure: BUNIONECTOMY;  Surgeon: Kayla Pinal, MD;                Location: [facility] ORS;  Service: Orthopedics;  Laterality:               Bilateral; ~ 2005: CARDIAC CATHETERIZATION     Comment:  once ~ 2010: CATARACT EXTRACTION W/ INTRAOCULAR LENS  IMPLANT, BILATERAL;  Bilateral 12/07/2015: COLONOSCOPY WITH PROPOFOL ; N/A     Comment:  Procedure: COLONOSCOPY WITH PROPOFOL ;  Surgeon: Gladis RAYMOND Mariner, MD;  Location: [facility] ENDOSCOPY;  Service:               Endoscopy;  Laterality: N/A; 11/26/2020: COLONOSCOPY WITH PROPOFOL ; N/A     Comment:  Procedure: COLONOSCOPY WITH PROPOFOL ;  Surgeon: Unk Corinn Skiff, MD;  Location: [facility] ENDOSCOPY;  Service:  Gastroenterology;  Laterality: N/A; 02/16/2016: EP IMPLANTABLE DEVICE; N/A     Comment:  MDT dual chamber (His Bundle) pacemaker implanted by Dr               Fernande for intermittent complete heart block 12/07/2015: ESOPHAGOGASTRODUODENOSCOPY (EGD) WITH PROPOFOL ; N/A     Comment:  Procedure: ESOPHAGOGASTRODUODENOSCOPY (EGD) WITH               PROPOFOL ;  Surgeon: Gladis RAYMOND Mariner, MD;  Location:               [facility] ENDOSCOPY;  Service: Endoscopy;  Laterality: N/A; 05/17/2017: ESOPHAGOGASTRODUODENOSCOPY (EGD) WITH PROPOFOL ; N/A     Comment:  Procedure: ESOPHAGOGASTRODUODENOSCOPY (EGD) WITH               PROPOFOL ;  Surgeon: Mariner Gladis RAYMOND, MD;  Location:               [facility] ENDOSCOPY;  Service: Endoscopy;  Laterality: N/A; No date: EYE SURGERY 01/06/2015: HAMMER TOE SURGERY; Bilateral     Comment:  Procedure: HAMMER  TOE CORRECTION;  Surgeon: Kayla Pinal, MD;  Location: [facility] ORS;  Service: Orthopedics;                Laterality: Bilateral; No date: JOINT REPLACEMENT 1965: KNEE CARTILAGE SURGERY; Left     Comment:  football injury 03/12/2023: LEAD EXTRACTION; N/A     Comment:  Procedure: LEAD EXTRACTION;  Surgeon: Cindie Ole DASEN, MD;  Location: [facility] INVASIVE CV LAB;  Service:               Cardiovascular;  Laterality: N/A; No date: Left Total Knee Arthroplasty     Comment:  a. 11/2013 [facility]. 2017: PACEMAKER IMPLANT 03/12/2023: PACEMAKER IMPLANT; N/A     Comment:  Procedure: PACEMAKER IMPLANT;  Surgeon: Cindie Ole DASEN, MD;  Location: [facility]VASIVE CV LAB;  Service:               Cardiovascular;  Laterality: N/A; 1970's: PILONIDAL CYST EXCISION 2004: TOTAL HIP ARTHROPLASTY; Right 2006: TOTAL HIP ARTHROPLASTY; Left No date: TOTAL KNEE ARTHROPLASTY; Right No date: UPPER GI ENDOSCOPY     Reproductive/Obstetrics negative OB ROS                              Anesthesia Physical Anesthesia Plan  ASA: 3  Anesthesia Plan: General LMA   Post-op Pain Management:    Induction: Intravenous  PONV Risk Score and Plan: Dexamethasone , Ondansetron , Midazolam  and Treatment may vary due to age or medical condition  Airway Management Planned: LMA  Additional Equipment:   Intra-op Plan:   Post-operative Plan: Extubation in OR  Informed Consent: I have reviewed the patients History and Physical, chart, labs and discussed the procedure including the risks, benefits and alternatives for the proposed anesthesia with the patient or authorized representative who has indicated his/her understanding and acceptance.     Dental Advisory Given  Plan Discussed with: Anesthesiologist, CRNA and Surgeon  Anesthesia Plan Comments: (Patient consented for risks of anesthesia including but not limited to:  - adverse reactions to medications -  damage to eyes, teeth, lips or other oral mucosa - nerve damage due to positioning  - sore throat  or hoarseness - Damage to heart, brain, nerves, lungs, other parts of body or loss of life  Patient voiced understanding and assent.)        Anesthesia Quick Evaluation

## 2023-09-17 NOTE — H&P (Signed)
 09/17/23 1:25 PM   Nicholas [name] 30-May-1945 [id number]  CC: Urethral stricture  HPI: 78 year old male with recurrent UTIs and dysuria, clinic cystoscopy shows a bulbar urethral stricture and he opted for balloon dilation.   PMH: Past Medical History:  Diagnosis Date   Abdominal pain, chronic, right lower quadrant 12/13/2020   Anterior urethral stricture    Anxiety    Arthritis    a. knees, hips, hands;  b. 11/2013 s/p L TKA @ [facility].   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 Dr Fernande   Depression    Diabetes mellitus without complication (HCC)    DVT (deep venous thrombosis) (HCC)    Erosive esophagitis    GERD (gastroesophageal reflux disease)    Gross hematuria    Hyperlipemia    Hypertension    borderline   Internal hemorrhoids    Orthostatic hypotension    Phimosis    Presence of permanent cardiac pacemaker    Pulmonary embolism (HCC)    RLS (restless legs syndrome)    Sensory neuropathy    Sleep apnea     Surgical History: Past Surgical History:  Procedure Laterality Date   ATRIAL FIBRILLATION ABLATION N/A 05/17/2020   Procedure: ATRIAL FIBRILLATION ABLATION;  Surgeon: Cindie Ole ONEIDA, MD;  Location: [facility] INVASIVE CV LAB;  Service: Cardiovascular;  Laterality: N/A;   BUNIONECTOMY Bilateral 01/06/2015   Procedure: ROMAYNE;  Surgeon: Kayla Pinal, MD;  Location: [facility] ORS;  Service: Orthopedics;  Laterality: Bilateral;   CARDIAC CATHETERIZATION  ~ 2005   once   CATARACT EXTRACTION W/ INTRAOCULAR LENS  IMPLANT, BILATERAL Bilateral ~ 2010   COLONOSCOPY WITH PROPOFOL  N/A 12/07/2015   Procedure: COLONOSCOPY WITH PROPOFOL ;  Surgeon: Gladis RAYMOND Mariner, MD;  Location: [facility] ENDOSCOPY;  Service: Endoscopy;  Laterality: N/A;   COLONOSCOPY WITH PROPOFOL  N/A 11/26/2020   Procedure: COLONOSCOPY WITH PROPOFOL ;  Surgeon: Unk Corinn Skiff, MD;  Location: [facility] ENDOSCOPY;  Service: Gastroenterology;  Laterality: N/A;   EP IMPLANTABLE DEVICE N/A 02/16/2016   MDT dual chamber (His Bundle) pacemaker implanted by Dr Fernande for intermittent complete heart block   ESOPHAGOGASTRODUODENOSCOPY (EGD) WITH PROPOFOL  N/A 12/07/2015   Procedure: ESOPHAGOGASTRODUODENOSCOPY (EGD) WITH PROPOFOL ;  Surgeon: Gladis RAYMOND Mariner, MD;  Location: [facility] ENDOSCOPY;  Service: Endoscopy;  Laterality: N/A;   ESOPHAGOGASTRODUODENOSCOPY (EGD) WITH PROPOFOL  N/A 05/17/2017   Procedure: ESOPHAGOGASTRODUODENOSCOPY (EGD) WITH PROPOFOL ;  Surgeon: Mariner Gladis RAYMOND, MD;  Location: [facility] ENDOSCOPY;  Service: Endoscopy;  Laterality: N/A;   EYE SURGERY     HAMMER TOE SURGERY Bilateral 01/06/2015   Procedure: HAMMER TOE CORRECTION;  Surgeon: Kayla Pinal, MD;  Location: [facility] ORS;  Service: Orthopedics;  Laterality: Bilateral;   JOINT REPLACEMENT     KNEE CARTILAGE SURGERY Left 1965   football injury   LEAD EXTRACTION N/A 03/12/2023   Procedure: LEAD EXTRACTION;  Surgeon: Cindie Ole ONEIDA, MD;  Location: [facility] INVASIVE CV LAB;  Service: Cardiovascular;  Laterality: N/A;   Left Total Knee Arthroplasty     a. 11/2013 [facility].   PACEMAKER IMPLANT  2017   PACEMAKER IMPLANT N/A 03/12/2023   Procedure: PACEMAKER IMPLANT;  Surgeon: Cindie Ole ONEIDA, MD;  Location: [facility] INVASIVE CV LAB;  Service: Cardiovascular;  Laterality: N/A;   PILONIDAL CYST EXCISION  1970's   TOTAL HIP ARTHROPLASTY Right 2004   TOTAL HIP ARTHROPLASTY Left 2006  TOTAL KNEE ARTHROPLASTY Right    UPPER GI ENDOSCOPY        Family History: Family History  Problem Relation Age of Onset   Alcoholism Mother        died in her 41's.   Cancer Mother    Stroke Father        deceased   Diabetes Father    Hypertension Father    Glaucoma Sister    Breast cancer Sister    Diabetes Daughter    Asthma Son    Stroke Paternal Grandmother    Prostate cancer Paternal Grandfather     Social History:   reports that he has never smoked. He has been exposed to tobacco smoke. He has never used smokeless tobacco. He reports that he does not currently use alcohol. He reports current drug use. Drug: Other-see comments.  Physical Exam: BP 134/86   Pulse 88   Temp 97.9 F (36.6 C) (Temporal)   Resp 16   SpO2 96%    Constitutional:  Alert and oriented, No acute distress. Cardiovascular: Regular rate and rhythm Respiratory: Clear to auscultation bilaterally GI: Abdomen is soft, nontender, nondistended, no abdominal masses   Laboratory Data: Culture negative  Assessment & Plan:    Comorbid 78 year old male with distant history of urethral stricture 2015, now with persistent dysuria despite negative urine cultures, recurrent UTI previously. On cystoscopy today found to have recurrence of bulbar urethral stricture. We discussed treatment options extensively, with his comorbidities he was interested in Optilume balloon dilation, we reviewed 80% success rate, risk of bleeding, infection, dysuria, recurrence, potential need for additional treatments in the future, potential additional pathology at time of cystoscopy including BPH or bladder lesions.   Cystoscopy, retrograde urethrogram, Optilume balloon dilation  [name], MD 09/17/2023  [facility] [address] [place], [place] [address] [phone]

## 2023-09-17 NOTE — Transfer of Care (Signed)
 Immediate Anesthesia Transfer of Care Note  Patient: Nicholas [name]  Procedure(s) Performed: CYSTOSCOPY WITH RETROGRADE URETHROGRAM (Urethra) CYSTOURETHROSCOPY, WITH URETHRAL STRICTURE DILATION USING DRUG-COATED BALLOON (Urethra)  Patient Location: PACU  Anesthesia Type:General  Level of Consciousness: awake, drowsy, and patient cooperative  Airway & Oxygen Therapy: Patient Spontanous Breathing  Post-op Assessment: Report given to RN and Post -op Vital signs reviewed and stable  Post vital signs: Reviewed and stable  Last Vitals:  Vitals Value Taken Time  BP 176/93 09/17/23 14:10  Temp    Pulse 85 09/17/23 14:12  Resp 19 09/17/23 14:12  SpO2 92 % 09/17/23 14:12  Vitals shown include unfiled device data.  Last Pain:  Vitals:   09/17/23 1149  TempSrc: Temporal  PainSc: 0-No pain         Complications: No notable events documented.

## 2023-09-18 ENCOUNTER — Encounter: Payer: Self-pay | Admitting: Urology

## 2023-09-19 ENCOUNTER — Encounter: Payer: Self-pay | Admitting: Physician Assistant

## 2023-09-19 ENCOUNTER — Ambulatory Visit (INDEPENDENT_AMBULATORY_CARE_PROVIDER_SITE_OTHER): Admitting: Physician Assistant

## 2023-09-19 VITALS — BP 154/69 | HR 69 | Ht 70.0 in | Wt 288.4 lb

## 2023-09-19 DIAGNOSIS — N35812 Other urethral bulbous stricture, male: Secondary | ICD-10-CM

## 2023-09-19 NOTE — Progress Notes (Signed)
 Catheter Removal  Patient is present today for a catheter removal.  10ml of water  was drained from the balloon. A 18FR foley cath was removed from the bladder, no complications were noted. Patient tolerated well.  Performed by: Myrtha Rubinstein, CMA  Follow up/ Additional notes: Return in about 3 months (around 12/20/2023) for Keep follow-up as scheduled.

## 2023-10-01 ENCOUNTER — Encounter: Payer: Self-pay | Admitting: Nurse Practitioner

## 2023-10-01 LAB — HM DIABETES EYE EXAM

## 2023-10-03 NOTE — Patient Instructions (Signed)

## 2023-10-05 ENCOUNTER — Encounter: Payer: Self-pay | Admitting: Nurse Practitioner

## 2023-10-05 ENCOUNTER — Ambulatory Visit (INDEPENDENT_AMBULATORY_CARE_PROVIDER_SITE_OTHER): Admitting: Nurse Practitioner

## 2023-10-05 VITALS — BP 118/69 | HR 80 | Temp 98.4°F | Ht 70.0 in | Wt 276.0 lb

## 2023-10-05 DIAGNOSIS — E1169 Type 2 diabetes mellitus with other specified complication: Secondary | ICD-10-CM | POA: Diagnosis not present

## 2023-10-05 DIAGNOSIS — I4819 Other persistent atrial fibrillation: Secondary | ICD-10-CM

## 2023-10-05 DIAGNOSIS — E785 Hyperlipidemia, unspecified: Secondary | ICD-10-CM

## 2023-10-05 DIAGNOSIS — I152 Hypertension secondary to endocrine disorders: Secondary | ICD-10-CM

## 2023-10-05 DIAGNOSIS — D6869 Other thrombophilia: Secondary | ICD-10-CM

## 2023-10-05 DIAGNOSIS — N1831 Chronic kidney disease, stage 3a: Secondary | ICD-10-CM

## 2023-10-05 DIAGNOSIS — Z23 Encounter for immunization: Secondary | ICD-10-CM

## 2023-10-05 DIAGNOSIS — E1159 Type 2 diabetes mellitus with other circulatory complications: Secondary | ICD-10-CM | POA: Diagnosis not present

## 2023-10-05 DIAGNOSIS — G894 Chronic pain syndrome: Secondary | ICD-10-CM

## 2023-10-05 DIAGNOSIS — F339 Major depressive disorder, recurrent, unspecified: Secondary | ICD-10-CM

## 2023-10-05 DIAGNOSIS — F419 Anxiety disorder, unspecified: Secondary | ICD-10-CM

## 2023-10-05 DIAGNOSIS — E669 Obesity, unspecified: Secondary | ICD-10-CM

## 2023-10-05 LAB — BAYER DCA HB A1C WAIVED: HB A1C (BAYER DCA - WAIVED): 6.8 % — ABNORMAL HIGH (ref 4.8–5.6)

## 2023-10-05 MED ORDER — GABAPENTIN 600 MG PO TABS: 600.0000 mg | ORAL_TABLET | Freq: Every day | ORAL | 4 refills | Status: AC

## 2023-10-05 MED ORDER — LORAZEPAM 1 MG PO TABS: ORAL_TABLET | ORAL | 0 refills | Status: DC

## 2023-10-05 NOTE — Progress Notes (Signed)
 BP 118/69 (BP Location: Right Arm, Patient Position: Sitting, Cuff Size: Large)   Pulse 80   Temp 98.4 F (36.9 C) (Oral)   Ht 5' 10 (1.778 m)   Wt 276 lb (125.2 kg)   SpO2 94%   BMI 39.60 kg/m    Subjective:    Patient ID: SINCERE LIUZZI, male    DOB: 1945-12-28, 78 y.o.   MRN: [id number]  HPI: KENDRYCK LACROIX is a 78 y.o. male  Chief Complaint  Patient presents with   Hypertension    Only with provider visits. No concerns he has noticed.    Hyperlipidemia   Diabetes    No home checks.    Depression/Anxiety    Doing alright he says.    DIABETES June A1c 6.8%. States diet has been terrible, got down after recent surgeries.  HISTORY: Ozempic  caused GI symptoms, including increased heart burn.  Jardiance  caused urinary issues and frequent UTIs in past, so urology discontinued. Hypoglycemic episodes:no Polydipsia/polyuria: no Visual disturbance: no Chest pain: no Paresthesias: no Glucose Monitoring: yes  Accucheck frequency: not checking  Fasting glucose:   Post prandial:  Evening:  Before meals: Taking Insulin ?: no  Long acting insulin :  Short acting insulin : Blood Pressure Monitoring: daily Retinal Examination: Up To Date -- [place], macular degeneration  Foot Exam: Up to Date Pneumovax: Up to Date Influenza: Up to Date Aspirin : no   HYPERTENSION / HYPERLIPIDEMIA/A-FIB Continues Rosuvastatin , Eliquis , fish oil - Midodrine  to prevent low BP. Had ablation 05/17/20. Echo 04/06/22 EF 60-65% and mild LVH.  Pacemaker placed January 2018 and lead extraction on 03/12/23. [facility] last 06/13/23.  Uses CPAP every night. Saw pulmonary for his SOB and diagnosis of CTEPH at present.  Last visit 12/18/22 with no changes, did attend pulmonary rehab at one time with  benefit. Satisfied with current treatment? ye Duration of hypertension: chronic BP monitoring frequency: not checking BP range:  BP medication side effects: no Duration of hyperlipidemia: chronic Cholesterol  medication side effects: no Cholesterol supplements: fish oil Medication compliance: good compliance Aspirin : no Recent stressors: no Recurrent headaches: no Visual changes: no Palpitations: no Dyspnea: with more strenuous activity Chest pain: no Lower extremity edema: no Dizzy/lightheaded: not as bad as used to be with Midodrine  on board  CHRONIC KIDNEY DISEASE CKD status: stable Medications renally dose: yes Previous renal evaluation: no Pneumovax:  Up to Date Influenza Vaccine:  Up to Date    Latest Ref Rng & Units 07/05/2023    8:45 AM 04/02/2023   10:17 AM 02/28/2023    9:16 AM  BMP  Glucose 70 - 99 mg/dL 855  836  852   BUN 8 - 27 mg/dL 16  23  24    Creatinine 0.76 - 1.27 mg/dL 8.91  8.87  8.65   BUN/Creat Ratio 10 - 24 15  21  18    Sodium 134 - 144 mmol/L 138  138  141   Potassium 3.5 - 5.2 mmol/L 4.5  5.0  5.0   Chloride 96 - 106 mmol/L 101  101  100   CO2 20 - 29 mmol/L 23  25  23    Calcium  8.6 - 10.2 mg/dL 9.5  9.7  9.8     DEPRESSION & CHRONIC PAIN Long term use of Effexor , Seroquel , and Ativan .  Pt and his wife at bedside made aware of risks of benzo medication use to include increased sedation, respiratory suppression, falls, extrapyramidal movements, dependence and cardiovascular events.  Pt and his wife would  like to continue treatment as benefit determined to outweigh risk.  Multiple at length discussions with him that he is also on opioid therapy.  Discussed risks with taking these three medications together at same time and recommend to separate them when taking Seroquel , Ativan , and opioid.      History. In past taking 1/2 tablet Ativan  did not work well (per wife and patient) and wishes to maintain current dosing.  Ativan  fill on PDMP review 07/13/23 and last opioid fill 09/30/23.  UDS with pain management on 12/14/22. Dr. Myra with pain management last seen on 08/20/23. Veteran, has been on this regimen for years. Duration: stable Anxious mood: little  bit Excessive worrying: not as much Irritability: no Sweating: no Nausea: no Palpitations:no Hyperventilation: no Panic attacks: no Agoraphobia: no  Obscessions/compulsions: no Depressed mood: sometimes    10/05/2023    8:54 AM 07/19/2023    4:45 PM 06/22/2023    9:45 AM 06/12/2023    8:52 AM 05/09/2023    2:30 PM  Depression screen PHQ 2/9  Decreased Interest 0 0 0 0 0  Down, Depressed, Hopeless 0 0 0 0 1  PHQ - 2 Score 0 0 0 0 1  Altered sleeping 0   0   Tired, decreased energy 2   1   Change in appetite 1   0   Feeling bad or failure about yourself  0   0   Trouble concentrating 0   0   Moving slowly or fidgety/restless 0   0   Suicidal thoughts 0   0   PHQ-9 Score 3   1   Difficult doing work/chores Not difficult at all   Not difficult at all        10/05/2023    8:55 AM 05/09/2023    2:45 PM 10/23/2022    9:11 AM 10/02/2022    9:09 AM  GAD 7 : Generalized Anxiety Score  Nervous, Anxious, on Edge 0  1 0  Control/stop worrying 2 0 2 3  Worry too much - different things 0 0 2 3  Trouble relaxing 0 1 1 0  Restless 0 0 0 0  Easily annoyed or irritable 0 0 1 0  Afraid - awful might happen 1 0 2 2  Total GAD 7 Score 3  9 8   Anxiety Difficulty Not difficult at all Not difficult at all Somewhat difficult Somewhat difficult   AIMS:  Facial and Oral Movements  Muscles of Facial Expression: None Lips and Perioral Area: None Jaw: None Tongue: None Extremity Movements: Upper (arms, wrists, hands, fingers): None Lower (legs, knees, ankles, toes): None Trunk Movements:  Neck, shoulders, hips: None Overall Severity : Severity of abnormal movements: None Incapacitation due to abnormal movements: None Patient's awareness of abnormal movements (rate only patient's report): none present Dental Status  Current problems with teeth and/or dentures?: No  Does patient usually wear dentures?: No   Relevant past medical, surgical, family and social history reviewed and updated as  indicated. Interim medical history since our last visit reviewed. Allergies and medications reviewed and updated.  Review of Systems  Constitutional:  Negative for activity change, appetite change, fatigue and fever.  Respiratory:  Positive for shortness of breath (occasional). Negative for cough, chest tightness and wheezing.   Cardiovascular:  Negative for chest pain, palpitations and leg swelling.  Endocrine: Negative for polydipsia, polyphagia and polyuria.  Musculoskeletal:  Positive for arthralgias.  Neurological: Negative.   Psychiatric/Behavioral:  Negative for decreased concentration, self-injury,  sleep disturbance and suicidal ideas. The patient is not nervous/anxious.    Per HPI unless specifically indicated above     Objective:    BP 118/69 (BP Location: Right Arm, Patient Position: Sitting, Cuff Size: Large)   Pulse 80   Temp 98.4 F (36.9 C) (Oral)   Ht 5' 10 (1.778 m)   Wt 276 lb (125.2 kg)   SpO2 94%   BMI 39.60 kg/m   Wt Readings from Last 3 Encounters:  10/05/23 276 lb (125.2 kg)  09/19/23 288 lb 6.4 oz (130.8 kg)  09/14/23 279 lb (126.6 kg)    Physical Exam Vitals and nursing note reviewed.  Constitutional:      General: He is awake. He is not in acute distress.    Appearance: Normal appearance. He is well-developed and well-groomed. He is obese. He is not ill-appearing or toxic-appearing.  HENT:     Head: Normocephalic.     Right Ear: Hearing and external ear normal.     Left Ear: Hearing and external ear normal.  Eyes:     General: Lids are normal.     Extraocular Movements: Extraocular movements intact.     Conjunctiva/sclera: Conjunctivae normal.  Neck:     Thyroid : No thyromegaly.     Vascular: No carotid bruit.  Cardiovascular:     Rate and Rhythm: Normal rate and regular rhythm.     Heart sounds: Normal heart sounds. No murmur heard.    No gallop.  Pulmonary:     Effort: No accessory muscle usage or respiratory distress.     Breath  sounds: Normal breath sounds.  Abdominal:     General: Bowel sounds are normal. There is no distension.     Palpations: Abdomen is soft.     Tenderness: There is no abdominal tenderness.  Musculoskeletal:     Cervical back: Full passive range of motion without pain.     Right lower leg: No edema.     Left lower leg: No edema.  Lymphadenopathy:     Cervical: No cervical adenopathy.  Skin:    General: Skin is warm.     Capillary Refill: Capillary refill takes less than 2 seconds.  Neurological:     Mental Status: He is alert and oriented to person, place, and time.     Deep Tendon Reflexes: Reflexes are normal and symmetric.     Reflex Scores:      Brachioradialis reflexes are 2+ on the right side and 2+ on the left side.      Patellar reflexes are 2+ on the right side and 2+ on the left side. Psychiatric:        Attention and Perception: Attention normal.        Mood and Affect: Mood normal.        Speech: Speech normal.        Behavior: Behavior normal. Behavior is cooperative.        Thought Content: Thought content normal.    Results for orders placed or performed in visit on 10/01/23  HM DIABETES EYE EXAM   Collection Time: 10/01/23 10:57 AM  Result Value Ref Range   HM Diabetic Eye Exam No Retinopathy No Retinopathy   *Note: Due to a large number of results and/or encounters for the requested time period, some results have not been displayed. A complete set of results can be found in Results Review.      Assessment & Plan:   Problem List Items Addressed This Visit  Cardiovascular and Mediastinum   Persistent atrial fibrillation (HCC)   Chronic, ongoing.  Followed by cardiology.  Will continue this collaboration and medication as ordered by them.      Hypertension associated with diabetes (HCC)   Chronic, stable.  BP at goal.  Followed by cardiology.  With orthostatic BP presenting occasionally.  Continue current regimen at this time and have recommended  utilizing compression hose at home, agrees to try this.  Continue collaboration with cardiology + neurology and current medication regimen.  Labs: CMP.  Recommend ensuring good fluid intake at home.        Relevant Orders   Bayer DCA Hb A1c Waived     Endocrine   Type 2 diabetes mellitus with obesity (HCC) - Primary   Diagnosed on 02/21/21, currently no medications. Jardiance  caused frequent UTI and Ozempic  GI issues.  A1c 6.8% today and urine ALB 35 March 2025.  Glucometer supplies present, recommend he check sugars at least daily fasting.  Labs today.  Recommend cutting back on snacking during day and minimize sweets.  Return in 3 months. - Statin on board.  No ACE or ARB due to hypotension episodes. - Vaccinations up to date. - Eye and foot exams up to date.      Relevant Orders   Bayer DCA Hb A1c Waived   Hyperlipidemia associated with type 2 diabetes mellitus (HCC)   Chronic, ongoing.  Continue current medication regimen and adjust as needed. Lipid panel today.      Relevant Orders   Bayer DCA Hb A1c Waived   Comprehensive metabolic panel with GFR   Lipid Panel w/o Chol/HDL Ratio     Genitourinary   Stage 3a chronic kidney disease (HCC) (Chronic)   Stable.  His recent labs since July 2022 have been within stable ranges majority of the time.  If ongoing stability will discontinue this diagnosis.        Other   Secondary hypercoagulable state (HCC) (Chronic)   On Eliquis  with A-fib.  Continue to monitor closely for bleeding or increased bruising.  CBC annually.      Morbid obesity (HCC) (Chronic)   BMI 39.60 with HTN, A-Fib, Heart Block, and CKD.  Recommended eating smaller high protein, low fat meals more frequently and exercising 30 mins a day 5 times a week with a goal of 10-15lb weight loss in the next 3 months. Patient voiced their understanding and motivation to adhere to these recommendations.       Depression, recurrent (HCC) (Chronic)   Chronic, ongoing.  Denies  SI/HI.  Continue current medication regimen and adjust as needed.  Would benefit from trial off Effexor  and trial of SSRI, but refuses this.        Relevant Medications   LORazepam  (ATIVAN ) 1 MG tablet   Chronic pain syndrome (Chronic)   Chronic, ongoing followed by pain clinic, Dr. Myra.  Discussed at length risk of benzo and opioid use in conjunction with each other.  Recommend he not take the two together at same hour during day or evening.        Relevant Medications   gabapentin  (NEURONTIN ) 600 MG tablet   Anxiety (Chronic)   Chronic, ongoing.  Discussed at length risk of benzo and opioid use in conjunction with each other.  Recommend he not take the two together at same hour during day or evening. Recommend now that has CPAP and improved sleep trial cut back slowly on Ativan .  They request 90 day supply, as previous PCP  supplied this.  Are aware he does have to return every 3 months for refills.  UDS up to date with pain management.        Relevant Medications   LORazepam  (ATIVAN ) 1 MG tablet     Follow up plan: Return in about 3 months (around 01/04/2024).

## 2023-10-05 NOTE — Assessment & Plan Note (Signed)
 Chronic, ongoing.  Discussed at length risk of benzo and opioid use in conjunction with each other.  Recommend he not take the two together at same hour during day or evening. Recommend now that has CPAP and improved sleep trial cut back slowly on Ativan.  They request 90 day supply, as previous PCP supplied this.  Are aware he does have to return every 3 months for refills.  UDS up to date with pain management.

## 2023-10-05 NOTE — Assessment & Plan Note (Signed)
 Chronic, ongoing.  Followed by cardiology.  Will continue this collaboration and medication as ordered by them.

## 2023-10-05 NOTE — Assessment & Plan Note (Signed)
Chronic, ongoing followed by pain clinic, Dr. Andree Elk.  Discussed at length risk of benzo and opioid use in conjunction with each other.  Recommend he not take the two together at same hour during day or evening.

## 2023-10-05 NOTE — Assessment & Plan Note (Signed)
 Chronic, stable.  BP at goal.  Followed by cardiology.  With orthostatic BP presenting occasionally.  Continue current regimen at this time and have recommended utilizing compression hose at home, agrees to try this.  Continue collaboration with cardiology + neurology and current medication regimen.  Labs: CMP.  Recommend ensuring good fluid intake at home.

## 2023-10-05 NOTE — Assessment & Plan Note (Signed)
 BMI 39.60 with HTN, A-Fib, Heart Block, and CKD.  Recommended eating smaller high protein, low fat meals more frequently and exercising 30 mins a day 5 times a week with a goal of 10-15lb weight loss in the next 3 months. Patient voiced their understanding and motivation to adhere to these recommendations.

## 2023-10-05 NOTE — Assessment & Plan Note (Signed)
 Stable.  His recent labs since July 2022 have been within stable ranges majority of the time.  If ongoing stability will discontinue this diagnosis.

## 2023-10-05 NOTE — Assessment & Plan Note (Signed)
 Diagnosed on 02/21/21, currently no medications. Jardiance  caused frequent UTI and Ozempic  GI issues.  A1c 6.8% today and urine ALB 35 March 2025.  Glucometer supplies present, recommend he check sugars at least daily fasting.  Labs today.  Recommend cutting back on snacking during day and minimize sweets.  Return in 3 months. - Statin on board.  No ACE or ARB due to hypotension episodes. - Vaccinations up to date. - Eye and foot exams up to date.

## 2023-10-05 NOTE — Assessment & Plan Note (Signed)
 Chronic, ongoing.  Continue current medication regimen and adjust as needed. Lipid panel today.

## 2023-10-05 NOTE — Assessment & Plan Note (Signed)
Chronic, ongoing.  Denies SI/HI.  Continue current medication regimen and adjust as needed.  Would benefit from trial off Effexor and trial of SSRI, but refuses this.   

## 2023-10-05 NOTE — Assessment & Plan Note (Signed)
On Eliquis with A-fib.  Continue to monitor closely for bleeding or increased bruising.  CBC annually. ?

## 2023-10-06 ENCOUNTER — Ambulatory Visit: Payer: Self-pay | Admitting: Nurse Practitioner

## 2023-10-06 LAB — COMPREHENSIVE METABOLIC PANEL WITH GFR
ALT: 19 IU/L (ref 0–44)
AST: 26 IU/L (ref 0–40)
Albumin: 4.3 g/dL (ref 3.8–4.8)
Alkaline Phosphatase: 55 IU/L (ref 44–121)
BUN/Creatinine Ratio: 15 (ref 10–24)
BUN: 17 mg/dL (ref 8–27)
Bilirubin Total: 0.5 mg/dL (ref 0.0–1.2)
CO2: 25 mmol/L (ref 20–29)
Calcium: 9.4 mg/dL (ref 8.6–10.2)
Chloride: 100 mmol/L (ref 96–106)
Creatinine, Ser: 1.15 mg/dL (ref 0.76–1.27)
Globulin, Total: 2.4 g/dL (ref 1.5–4.5)
Glucose: 179 mg/dL — ABNORMAL HIGH (ref 70–99)
Potassium: 4.6 mmol/L (ref 3.5–5.2)
Sodium: 140 mmol/L (ref 134–144)
Total Protein: 6.7 g/dL (ref 6.0–8.5)
eGFR: 65 mL/min/1.73 (ref >59)

## 2023-10-06 LAB — LIPID PANEL W/O CHOL/HDL RATIO
Cholesterol, Total: 134 mg/dL (ref 100–199)
HDL: 47 mg/dL (ref >39)
LDL Chol Calc (NIH): 61 mg/dL (ref 0–99)
Triglycerides: 151 mg/dL — ABNORMAL HIGH (ref 0–149)
VLDL Cholesterol Cal: 26 mg/dL (ref 5–40)

## 2023-10-06 NOTE — Progress Notes (Signed)
 Contacted via MyChart  Good morning Nicholas [name], your labs have returned: - Lipid panel remains stable with exception of mild trend up in triglycerides. Continue your statin therapy. - Kidney function, creatinine and eGFR, remains normal, as is liver function, AST and ALT. No changes needed.  Great job!! Keep being amazing!!  Thank you for allowing me to participate in your care.  I appreciate you. Kindest regards, [place]

## 2023-10-11 ENCOUNTER — Telehealth

## 2023-10-17 ENCOUNTER — Encounter: Payer: Self-pay | Admitting: Anesthesiology

## 2023-10-17 ENCOUNTER — Ambulatory Visit: Attending: Anesthesiology | Admitting: Anesthesiology

## 2023-10-17 DIAGNOSIS — M5416 Radiculopathy, lumbar region: Secondary | ICD-10-CM

## 2023-10-17 DIAGNOSIS — M5431 Sciatica, right side: Secondary | ICD-10-CM | POA: Diagnosis not present

## 2023-10-17 DIAGNOSIS — M79642 Pain in left hand: Secondary | ICD-10-CM

## 2023-10-17 DIAGNOSIS — M5432 Sciatica, left side: Secondary | ICD-10-CM

## 2023-10-17 DIAGNOSIS — G894 Chronic pain syndrome: Secondary | ICD-10-CM

## 2023-10-17 DIAGNOSIS — M47816 Spondylosis without myelopathy or radiculopathy, lumbar region: Secondary | ICD-10-CM

## 2023-10-17 DIAGNOSIS — M1611 Unilateral primary osteoarthritis, right hip: Secondary | ICD-10-CM

## 2023-10-17 DIAGNOSIS — F1190 Opioid use, unspecified, uncomplicated: Secondary | ICD-10-CM

## 2023-10-17 DIAGNOSIS — M79641 Pain in right hand: Secondary | ICD-10-CM

## 2023-10-17 MED ORDER — HYDROCODONE-ACETAMINOPHEN 5-325 MG PO TABS: 1.0000 | ORAL_TABLET | Freq: Two times a day (BID) | ORAL | 0 refills | Status: AC

## 2023-10-17 MED ORDER — HYDROCODONE-ACETAMINOPHEN 5-325 MG PO TABS: 1.0000 | ORAL_TABLET | Freq: Four times a day (QID) | ORAL | 0 refills | Status: AC | PRN

## 2023-10-17 NOTE — Progress Notes (Signed)
 Virtual Visit via Telephone Note  I connected with Nicholas [name] on 10/17/23 at  4:00 PM EDT by telephone and verified that I am speaking with the correct person using two identifiers.  Location: Patient: Home Provider: Pain control center   I discussed the limitations, risks, security and privacy concerns of performing an evaluation and management service by telephone and the availability of in person appointments. I also discussed with the patient that there may be a patient responsible charge related to this service. The patient expressed understanding and agreed to proceed.   History of Present Illness: Is able to speak to Nicholas [name] via telephone as he was unable to do the video portion the conference.  He reports that his low back pain and leg pain has been stable in nature with no recent changes.  He is still taking his Vicodin twice a day and this is working well for him.  Medications enable him to stay active and functional and complete daily household chores.  He is also sleeping better with the medication.  Without the medication he reports that he is severely limited in capacity.  No changes in lower extremity strength function bowel or bladder function are noted.  The quality characteristic and distribution of the pain has been stable in nature with no recent changes noted.  Review of systems: General: No fevers or chills Pulmonary: No shortness of breath or dyspnea Cardiac: No angina or palpitations or lightheadedness GI: No abdominal pain or constipation Psych: No depression    Observations/Objective:  Current Outpatient Medications:    [START ON 11/29/2023] HYDROcodone -acetaminophen  (NORCO/VICODIN) 5-325 MG tablet, Take 1 tablet by mouth 2 (two) times daily., Disp: 60 tablet, Rfl: 0   acetaminophen  (TYLENOL ) 650 MG CR tablet, Take 650 mg by mouth every 8 (eight) hours as needed for pain., Disp: , Rfl:    apixaban  (ELIQUIS ) 5 MG TABS tablet, Take 1 tablet (5 mg total) by  mouth 2 (two) times daily., Disp: 180 tablet, Rfl: 1   Ascorbic Acid  (VITAMIN C ) 1000 MG tablet, Take 1,000 mg by mouth daily., Disp: , Rfl:    Blood Glucose Monitoring Suppl (ONETOUCH VERIO) w/Device KIT, Use to check blood sugar 3 times a day and document results, bring to appointments.  Goal is <130 fasting blood sugar and <180 two hours after meals., Disp: 1 kit, Rfl: 0   calcium -vitamin D  (OSCAL WITH D) 500-5 MG-MCG tablet, Take 1 tablet by mouth daily with breakfast., Disp: , Rfl:    Cranberry 500 MG CAPS, Take 500 mg by mouth daily., Disp: , Rfl:    docusate (COLACE) 50 MG/5ML liquid, Take 50 mg by mouth daily., Disp: , Rfl:    gabapentin  (NEURONTIN ) 600 MG tablet, Take 1 tablet (600 mg total) by mouth at bedtime., Disp: 90 tablet, Rfl: 4   glucose blood test strip, Use to check blood sugar 3 times a day and document results, bring to appointments.  Goal is <130 fasting blood sugar and <180 two hours after meals., Disp: 100 each, Rfl: 12   [START ON 10/30/2023] HYDROcodone -acetaminophen  (NORCO/VICODIN) 5-325 MG tablet, Take 1 tablet by mouth every 6 (six) hours as needed for moderate pain (pain score 4-6) or severe pain (pain score 7-10)., Disp: 60 tablet, Rfl: 0   Lancets (ONETOUCH ULTRASOFT) lancets, Use to check blood sugar 3 times a day and document results, bring to appointments.  Goal is <130 fasting blood sugar and <180 two hours after meals., Disp: 100 each, Rfl: 12  LORazepam  (ATIVAN ) 1 MG tablet, TAKE 1 TABLET BY MOUTH EVERYDAY AT BEDTIME, Disp: 90 tablet, Rfl: 0   Magnesium  400 MG TABS, Take 400 mg by mouth daily. , Disp: , Rfl:    MELATONIN PO, Take 10 mg by mouth at bedtime., Disp: , Rfl:    midodrine  (PROAMATINE ) 5 MG tablet, Take 10 mg in the morning, take 5 mg in the afternoon, and 5 mg in the evening, Disp: 270 tablet, Rfl: 3   Multiple Vitamin (MULTIVITAMIN WITH MINERALS) TABS tablet, Take 1 tablet by mouth daily. Centrum Silver, Disp: , Rfl:    NON FORMULARY, Pt uses a cpap  nightly, Disp: , Rfl:    nystatin  cream (MYCOSTATIN ), Apply 1 Application topically 2 (two) times daily., Disp: 30 g, Rfl: 4   Omega-3 Fatty Acids (FISH OIL) 1200 MG CAPS, Take 1,200 mg by mouth daily., Disp: , Rfl:    oxymetazoline  (AFRIN) 0.05 % nasal spray, Place 2 sprays into both nostrils at bedtime., Disp: , Rfl:    pantoprazole  (PROTONIX ) 20 MG tablet, Take 1 tablet (20 mg total) by mouth daily., Disp: 90 tablet, Rfl: 2   potassium chloride  SA (KLOR-CON  M) 20 MEQ tablet, Take 2 tablets (40 meq) with Torsemide  (Patient taking differently: Take 40 mEq by mouth daily as needed (when taking torsemide ).), Disp: 30 tablet, Rfl: 3   QUEtiapine  Fumarate (SEROQUEL  XR) 150 MG 24 hr tablet, Take 1 tablet (150 mg total) by mouth at bedtime., Disp: 90 tablet, Rfl: 4   rOPINIRole  (REQUIP ) 0.25 MG tablet, Take 0.25 mg by mouth 4 (four) times daily., Disp: , Rfl:    rosuvastatin  (CRESTOR ) 40 MG tablet, Take 1 tablet (40 mg total) by mouth daily., Disp: 90 tablet, Rfl: 3   solifenacin  (VESICARE ) 10 MG tablet, Take 1 tablet (10 mg total) by mouth daily., Disp: 90 tablet, Rfl: 2   tiZANidine  (ZANAFLEX ) 4 MG tablet, Take 1 tablet (4 mg total) by mouth every 6 (six) hours as needed for muscle spasms., Disp: 90 tablet, Rfl: 4   torsemide  (DEMADEX ) 20 MG tablet, TAKE 2 TABLETS (40 MG TOTAL) BY MOUTH DAILY AS NEEDED (FLUID RETENTION)., Disp: 90 tablet, Rfl: 0   venlafaxine  XR (EFFEXOR -XR) 75 MG 24 hr capsule, Take 3 capsules (225 mg total) by mouth daily., Disp: 270 capsule, Rfl: 3   Past Medical History:  Diagnosis Date   Abdominal pain, chronic, right lower quadrant 12/13/2020   Anterior urethral stricture    Anxiety    Arthritis    a. knees, hips, hands;  b. 11/2013 s/p L TKA @ [facility].   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 Dr Fernande   Depression    Diabetes mellitus  without complication (HCC)    DVT (deep venous thrombosis) (HCC)    Erosive esophagitis    GERD (gastroesophageal reflux disease)    Gross hematuria    Hyperlipemia    Hypertension    borderline   Internal hemorrhoids    Orthostatic hypotension    Phimosis    Presence of permanent cardiac pacemaker    Pulmonary embolism (HCC)    RLS (restless legs syndrome)    Sensory neuropathy    Sleep apnea    Assessment and Plan:  1. Chronic pain syndrome   2. Lumbar radiculopathy   3. Bilateral sciatica   4. Facet syndrome, lumbar  5. Bilateral hand pain   6. Chronic, continuous use of opioids   7. Osteoarthritis of right hip, unspecified osteoarthritis type    Based on our conversation today and after review of the [place]  practitioner database information it is appropriate to refill his medicines for the next 2 months.  Will schedule him for return to clinic in 2 months.  In the meantime continue with exercises and physical therapy with return to his golf game as tolerated.  Additionally, continue with follow-up with his primary care physicians for baseline medical care Follow Up Instructions:    I discussed the assessment and treatment plan with the patient. The patient was provided an opportunity to ask questions and all were answered. The patient agreed with the plan and demonstrated an understanding of the instructions.   The patient was advised to call back or seek an in-person evaluation if the symptoms worsen or if the condition fails to improve as anticipated.  I provided 30 minutes of non-face-to-face time during this encounter.   Lynwood KANDICE Clause, MD

## 2023-10-17 NOTE — Progress Notes (Signed)
 Remote PPM Transmission

## 2023-11-02 ENCOUNTER — Other Ambulatory Visit: Payer: Self-pay

## 2023-11-02 NOTE — Patient Instructions (Addendum)
 Thank you for allowing the Complex Care Management team to participate in your care. It was great speaking with you!  Congratulations on meeting your care management goals! Keep up the great work managing your care.  I hope you all enjoy your well deserved beach vacation!  Please do not hesitate to notify your primary care provider if your health needs change and you require additional outreach. The care team will gladly assist.   Jackson Acron [facility] RN Care Manager Direct Dial: [phone]  Fax: [phone] Website: [email or web address]

## 2023-11-02 NOTE — Patient Outreach (Signed)
 Complex Care Management   Visit Note  11/02/2023  Name:  Nicholas [name] MRN: [id number] DOB: 1945-10-24  Situation: Referral received for Complex Care Management related to Pulmonary Embolism, Hypertension, Hyperlipidemia, and controlled Diabetes.  I obtained verbal consent from Patient.  Visit completed with Nicholas [name] and his spouse via telephone today.  Background:   Past Medical History:  Diagnosis Date   Abdominal pain, chronic, right lower quadrant 12/13/2020   Anterior urethral stricture    Anxiety    Arthritis    a. knees, hips, hands;  b. 11/2013 s/p L TKA @ [facility].   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 Dr Fernande   Depression    Diabetes mellitus without complication (HCC)    DVT (deep venous thrombosis) (HCC)    Erosive esophagitis    GERD (gastroesophageal reflux disease)    Gross hematuria    Hyperlipemia    Hypertension    borderline   Internal hemorrhoids    Orthostatic hypotension    Phimosis    Presence of permanent cardiac pacemaker    Pulmonary embolism (HCC)    RLS (restless legs syndrome)    Sensory neuropathy    Sleep apnea       Assessment: Patient Reported Symptoms:  Cognitive Cognitive Status: Alert and oriented to person, place, and time, Normal speech and language skills Cognitive/Intellectual Conditions Management [RPT]: None reported or documented in medical history or problem list Health Maintenance Behaviors: Annual physical exam Healing Pattern: Average Health Facilitated by: Pain control  Neurological Neurological Review of Symptoms: No symptoms reported Neurological Self-Management Outcome: 4 (good)  HEENT HEENT Symptoms Reported: No symptoms reported  Cardiovascular Cardiovascular Symptoms Reported: No symptoms reported Does patient have uncontrolled Hypertension?: No Cardiovascular Self-Management  Outcome: 4 (good)  Respiratory Respiratory Symptoms Reported: No symptoms reported Respiratory Self-Management Outcome: 4 (good)  Endocrine Endocrine Symptoms Reported: No symptoms reported Is patient diabetic?: Yes Is patient checking blood sugars at home?: No (Checking occassionally. A1C currently within goal without medications.) Endocrine Self-Management Outcome: 4 (good)  Gastrointestinal Gastrointestinal Symptoms Reported: No symptoms reported Gastrointestinal Self-Management Outcome: 4 (good)  Genitourinary Genitourinary Symptoms Reported: No symptoms reported Genitourinary Self-Management Outcome: 4 (good)  Integumentary Integumentary Symptoms Reported: No symptoms reported Skin Self-Management Outcome: 4 (good)  Musculoskeletal Musculoskelatal Symptoms Reviewed: Other Other Musculoskeletal Symptoms: History of chronic joint pain Musculoskeletal Self-Management Outcome: 4 (good)  Psychosocial Psychosocial Symptoms Reported: No symptoms reported Quality of Family Relationships: supportive Do you feel physically threatened by others?: No    11/02/2023    PHQ2-9 Depression Screening   Little interest or pleasure in doing things Not at all  Feeling down, depressed, or hopeless Not at all  PHQ-2 - Total Score 0    There were no vitals filed for this visit.  Medications Reviewed Today     Reviewed by Karoline Lima, RN (Registered Nurse) on 11/02/23 at 1004  Med List Status: <None>   Medication Order Taking? Sig Documenting Provider Last Dose Status Informant  acetaminophen  (TYLENOL ) 650 MG CR tablet [id number]  Take 650 mg by mouth every 8 (eight) hours as needed for pain. [provider]  Active Spouse/Significant Other  apixaban  (ELIQUIS ) 5 MG TABS tablet [id number]  Take 1 tablet (5 mg total) by mouth 2 (two) times daily. Fernande Elspeth BROCKS, MD  Active   Ascorbic Acid  (VITAMIN  C) 1000 MG tablet [id number]  Take 1,000 mg by mouth daily. [provider]  Active  Spouse/Significant Other  Blood Glucose Monitoring Suppl (ONETOUCH VERIO) w/Device KIT [id number]  Use to check blood sugar 3 times a day and document results, bring to appointments.  Goal is <130 fasting blood sugar and <180 two hours after meals. Valerio Moris T, NP  Active Spouse/Significant Other  calcium -vitamin D  (OSCAL WITH D) 500-5 MG-MCG tablet [id number]  Take 1 tablet by mouth daily with breakfast. [provider]  Active Spouse/Significant Other  Cranberry 500 MG CAPS [id number]  Take 500 mg by mouth daily. [provider]  Active Spouse/Significant Other  docusate (COLACE) 50 MG/5ML liquid [id number]  Take 50 mg by mouth daily. [provider]  Active   gabapentin  (NEURONTIN ) 600 MG tablet [id number]  Take 1 tablet (600 mg total) by mouth at bedtime. Valerio Moris T, NP  Active   glucose blood test strip [id number]  Use to check blood sugar 3 times a day and document results, bring to appointments.  Goal is <130 fasting blood sugar and <180 two hours after meals. Cannady, Jolene T, NP  Active Spouse/Significant Other  HYDROcodone -acetaminophen  (NORCO/VICODIN) 5-325 MG tablet [id number]  Take 1 tablet by mouth every 6 (six) hours as needed for moderate pain (pain score 4-6) or severe pain (pain score 7-10). Myra Lynwood MATSU, MD  Active   HYDROcodone -acetaminophen  (NORCO/VICODIN) 5-325 MG tablet [id number]  Take 1 tablet by mouth 2 (two) times daily. Myra Lynwood MATSU, MD  Active   Lancets [facility] ULTRASOFT) lancets [id number]  Use to check blood sugar 3 times a day and document results, bring to appointments.  Goal is <130 fasting blood sugar and <180 two hours after meals. Valerio Moris T, NP  Active Spouse/Significant Other  LORazepam  (ATIVAN ) 1 MG tablet [id number]  TAKE 1 TABLET BY MOUTH EVERYDAY AT BEDTIME Cannady, Jolene T, NP  Active   Magnesium  400 MG TABS [id number]  Take 400 mg by mouth daily.  [provider]  Active Spouse/Significant Other  MELATONIN  PO [id number]  Take 10 mg by mouth at bedtime. [provider]  Active Spouse/Significant Other  midodrine  (PROAMATINE ) 5 MG tablet [id number]  Take 10 mg in the morning, take 5 mg in the afternoon, and 5 mg in the evening Fernande Elspeth BROCKS, MD  Active   Multiple Vitamin (MULTIVITAMIN WITH MINERALS) TABS tablet [id number]  Take 1 tablet by mouth daily. Centrum Silver [provider]  Active Spouse/Significant Other  NON FORMULARY [id number]  Pt uses a cpap nightly [provider]  Active Spouse/Significant Other  nystatin  cream (MYCOSTATIN ) [id number]  Apply 1 Application topically 2 (two) times daily. Johnson, Megan P, DO  Active Spouse/Significant Other  Omega-3 Fatty Acids (FISH OIL) 1200 MG CAPS [id number]  Take 1,200 mg by mouth daily. [provider]  Active Spouse/Significant Other  oxymetazoline  (AFRIN) 0.05 % nasal spray [id number]  Place 2 sprays into both nostrils at bedtime. [provider]  Active Spouse/Significant Other  pantoprazole  (PROTONIX ) 20 MG tablet [id number]  Take 1 tablet (20 mg total) by mouth daily. Cannady, Jolene T, NP  Active   potassium chloride  SA (KLOR-CON  M) 20 MEQ tablet [id number]  Take 2 tablets (40 meq) with Torsemide   Patient taking differently: Take 40 mEq by mouth daily as needed (when taking torsemide ).   Valerio Moris T, NP  Active Spouse/Significant Other  QUEtiapine  Fumarate (SEROQUEL  XR) 150 MG 24 hr tablet [id number]  Take 1 tablet (150 mg total)  by mouth at bedtime. Cannady, Jolene T, NP  Active   rOPINIRole  (REQUIP ) 0.25 MG tablet [id number]  Take 0.25 mg by mouth 4 (four) times daily. [provider]  Active Spouse/Significant Other  rosuvastatin  (CRESTOR ) 40 MG tablet [id number]  Take 1 tablet (40 mg total) by mouth daily. Cannady, Jolene T, NP  Active   solifenacin  (VESICARE ) 10 MG tablet [id number]  Take 1 tablet (10 mg total) by mouth daily. Cannady, Jolene T, NP  Active   tiZANidine  (ZANAFLEX ) 4 MG tablet  [id number]  Take 1 tablet (4 mg total) by mouth every 6 (six) hours as needed for muscle spasms. Cannady, Jolene T, NP  Active Spouse/Significant Other  torsemide  (DEMADEX ) 20 MG tablet [id number]  TAKE 2 TABLETS (40 MG TOTAL) BY MOUTH DAILY AS NEEDED (FLUID RETENTION). Fernande Elspeth BROCKS, MD  Active Spouse/Significant Other  venlafaxine  XR (EFFEXOR -XR) 75 MG 24 hr capsule [id number]  Take 3 capsules (225 mg total) by mouth daily. Valerio Melanie DASEN, NP  Active   Med List Note Geanie Wolm HERO, RN 10/18/23 9190): UDS 12-14-22 MR 12-29-23  When searching in control substance database search exactly this: Daton Alarie jr.            Recommendation:   Continue Current Plan of Care  Follow Up Plan:   Patient has met all care management goals. Patient has been provided contact information should new needs arise.    Jackson Acron [facility] Population [facility] RN Care Manager Direct Dial: [phone]  Fax: [phone] Website: [email or web address]

## 2023-12-03 ENCOUNTER — Ambulatory Visit: Payer: Self-pay | Admitting: Cardiology

## 2023-12-04 ENCOUNTER — Ambulatory Visit: Attending: Anesthesiology | Admitting: Anesthesiology

## 2023-12-04 ENCOUNTER — Encounter: Payer: Self-pay | Admitting: Anesthesiology

## 2023-12-04 VITALS — BP 116/76 | HR 88 | Temp 97.2°F | Resp 18 | Ht 70.0 in | Wt 289.0 lb

## 2023-12-04 DIAGNOSIS — M79642 Pain in left hand: Secondary | ICD-10-CM | POA: Diagnosis present

## 2023-12-04 DIAGNOSIS — M5432 Sciatica, left side: Secondary | ICD-10-CM | POA: Insufficient documentation

## 2023-12-04 DIAGNOSIS — F1190 Opioid use, unspecified, uncomplicated: Secondary | ICD-10-CM | POA: Diagnosis present

## 2023-12-04 DIAGNOSIS — M1611 Unilateral primary osteoarthritis, right hip: Secondary | ICD-10-CM | POA: Insufficient documentation

## 2023-12-04 DIAGNOSIS — M5431 Sciatica, right side: Secondary | ICD-10-CM | POA: Diagnosis present

## 2023-12-04 DIAGNOSIS — G894 Chronic pain syndrome: Secondary | ICD-10-CM | POA: Diagnosis present

## 2023-12-04 DIAGNOSIS — M5416 Radiculopathy, lumbar region: Secondary | ICD-10-CM | POA: Insufficient documentation

## 2023-12-04 DIAGNOSIS — M47816 Spondylosis without myelopathy or radiculopathy, lumbar region: Secondary | ICD-10-CM | POA: Diagnosis present

## 2023-12-04 DIAGNOSIS — M79641 Pain in right hand: Secondary | ICD-10-CM | POA: Insufficient documentation

## 2023-12-04 MED ORDER — HYDROCODONE-ACETAMINOPHEN 5-325 MG PO TABS: 1.0000 | ORAL_TABLET | Freq: Two times a day (BID) | ORAL | 0 refills | Status: DC

## 2023-12-04 MED ORDER — HYDROCODONE-ACETAMINOPHEN 5-325 MG PO TABS: 1.0000 | ORAL_TABLET | Freq: Two times a day (BID) | ORAL | 0 refills | Status: AC

## 2023-12-04 NOTE — Progress Notes (Signed)
 Nursing Pain Medication Assessment:  Safety precautions to be maintained throughout the outpatient stay will include: orient to surroundings, keep bed in low position, maintain call bell within reach at all times, provide assistance with transfer out of bed and ambulation.  Medication Inspection Compliance: Nicholas [name] did not comply with our request to bring his pills to be counted. He was reminded that bringing the medication bottles, even when empty, is a requirement.  Medication: None brought in. Pill/Patch Count: None available to be counted. Bottle Appearance: No container available. Did not bring bottle(s) to appointment. Filled Date: N/A Last Medication intake:  Yesterday

## 2023-12-05 ENCOUNTER — Encounter: Attending: Pulmonary Disease

## 2023-12-05 ENCOUNTER — Other Ambulatory Visit: Payer: Self-pay

## 2023-12-05 DIAGNOSIS — R0600 Dyspnea, unspecified: Secondary | ICD-10-CM | POA: Insufficient documentation

## 2023-12-05 NOTE — Progress Notes (Signed)
 Virtual Visit completed. Patient informed on EP and RD appointment and 6 Minute walk test. Patient also informed of patient health questionnaires on My Chart. Patient Verbalizes understanding. Visit diagnosis can be found in [facility] 02/06/2023.

## 2023-12-07 LAB — TOXASSURE SELECT 13 (MW), URINE

## 2023-12-12 ENCOUNTER — Ambulatory Visit: Payer: Medicare Other

## 2023-12-12 ENCOUNTER — Encounter: Admitting: *Deleted

## 2023-12-12 VITALS — Ht 71.0 in | Wt 281.1 lb

## 2023-12-12 DIAGNOSIS — R0600 Dyspnea, unspecified: Secondary | ICD-10-CM

## 2023-12-12 DIAGNOSIS — I495 Sick sinus syndrome: Secondary | ICD-10-CM

## 2023-12-12 NOTE — Progress Notes (Signed)
 Pulmonary Individual Treatment Plan  Patient Details  Name: Nicholas [name] MRN: [id number] Date of Birth: March 03, 1945 Referring Provider:   Flowsheet Row Pulmonary Rehab from 12/12/2023 in [facility] Cardiac and Pulmonary Rehab  Referring Provider Aleskerov    Initial Encounter Date:  Flowsheet Row Pulmonary Rehab from 12/12/2023 in [facility] Cardiac and Pulmonary Rehab  Date 12/12/23    Visit Diagnosis: Dyspnea, unspecified type  Patient's Home Medications on Admission:  Current Outpatient Medications:    acetaminophen  (TYLENOL ) 650 MG CR tablet, Take 650 mg by mouth every 8 (eight) hours as needed for pain., Disp: , Rfl:    albuterol  (VENTOLIN  HFA) 108 (90 Base) MCG/ACT inhaler, Inhale 2 puffs into the lungs., Disp: , Rfl:    apixaban  (ELIQUIS ) 5 MG TABS tablet, Take 1 tablet (5 mg total) by mouth 2 (two) times daily., Disp: 180 tablet, Rfl: 1   Ascorbic Acid  (VITAMIN C ) 1000 MG tablet, Take 1,000 mg by mouth daily., Disp: , Rfl:    Blood Glucose Monitoring Suppl (ONETOUCH VERIO) w/Device KIT, Use to check blood sugar 3 times a day and document results, bring to appointments.  Goal is <130 fasting blood sugar and <180 two hours after meals., Disp: 1 kit, Rfl: 0   calcium -vitamin D  (OSCAL WITH D) 500-5 MG-MCG tablet, Take 1 tablet by mouth daily with breakfast., Disp: , Rfl:    Cranberry 500 MG CAPS, Take 500 mg by mouth daily., Disp: , Rfl:    docusate (COLACE) 50 MG/5ML liquid, Take 50 mg by mouth daily., Disp: , Rfl:    gabapentin  (NEURONTIN ) 600 MG tablet, Take 1 tablet (600 mg total) by mouth at bedtime., Disp: 90 tablet, Rfl: 4   glucose blood test strip, Use to check blood sugar 3 times a day and document results, bring to appointments.  Goal is <130 fasting blood sugar and <180 two hours after meals., Disp: 100 each, Rfl: 12   HYDROcodone -acetaminophen  (NORCO/VICODIN) 5-325 MG tablet, Take 1 tablet by mouth 2 (two) times daily., Disp: 60 tablet, Rfl: 0   [START ON 12/28/2023]  HYDROcodone -acetaminophen  (NORCO/VICODIN) 5-325 MG tablet, Take 1 tablet by mouth 2 (two) times daily., Disp: 60 tablet, Rfl: 0   [START ON 01/27/2024] HYDROcodone -acetaminophen  (NORCO/VICODIN) 5-325 MG tablet, Take 1 tablet by mouth 2 (two) times daily., Disp: 60 tablet, Rfl: 0   Lancets (ONETOUCH ULTRASOFT) lancets, Use to check blood sugar 3 times a day and document results, bring to appointments.  Goal is <130 fasting blood sugar and <180 two hours after meals., Disp: 100 each, Rfl: 12   LORazepam  (ATIVAN ) 1 MG tablet, TAKE 1 TABLET BY MOUTH EVERYDAY AT BEDTIME, Disp: 90 tablet, Rfl: 0   Magnesium  400 MG TABS, Take 400 mg by mouth daily. , Disp: , Rfl:    MELATONIN PO, Take 10 mg by mouth at bedtime., Disp: , Rfl:    midodrine  (PROAMATINE ) 5 MG tablet, Take 10 mg in the morning, take 5 mg in the afternoon, and 5 mg in the evening, Disp: 270 tablet, Rfl: 3   Multiple Vitamin (MULTIVITAMIN WITH MINERALS) TABS tablet, Take 1 tablet by mouth daily. Centrum Silver, Disp: , Rfl:    NON FORMULARY, Pt uses a cpap nightly, Disp: , Rfl:    nystatin  cream (MYCOSTATIN ), Apply 1 Application topically 2 (two) times daily., Disp: 30 g, Rfl: 4   Omega-3 Fatty Acids (FISH OIL) 1200 MG CAPS, Take 1,200 mg by mouth daily., Disp: , Rfl:    oxymetazoline  (AFRIN) 0.05 % nasal spray, Place 2  sprays into both nostrils at bedtime., Disp: , Rfl:    pantoprazole  (PROTONIX ) 20 MG tablet, Take 1 tablet (20 mg total) by mouth daily., Disp: 90 tablet, Rfl: 2   potassium chloride  SA (KLOR-CON  M) 20 MEQ tablet, Take 2 tablets (40 meq) with Torsemide  (Patient taking differently: Take 40 mEq by mouth daily as needed (when taking torsemide ).), Disp: 30 tablet, Rfl: 3   QUEtiapine  Fumarate (SEROQUEL  XR) 150 MG 24 hr tablet, Take 1 tablet (150 mg total) by mouth at bedtime., Disp: 90 tablet, Rfl: 4   rOPINIRole  (REQUIP ) 0.25 MG tablet, Take 0.25 mg by mouth 4 (four) times daily., Disp: , Rfl:    rosuvastatin  (CRESTOR ) 40 MG tablet,  Take 1 tablet (40 mg total) by mouth daily., Disp: 90 tablet, Rfl: 3   solifenacin  (VESICARE ) 10 MG tablet, Take 1 tablet (10 mg total) by mouth daily., Disp: 90 tablet, Rfl: 2   tiZANidine  (ZANAFLEX ) 4 MG tablet, Take 1 tablet (4 mg total) by mouth every 6 (six) hours as needed for muscle spasms., Disp: 90 tablet, Rfl: 4   torsemide  (DEMADEX ) 20 MG tablet, TAKE 2 TABLETS (40 MG TOTAL) BY MOUTH DAILY AS NEEDED (FLUID RETENTION)., Disp: 90 tablet, Rfl: 0   venlafaxine  XR (EFFEXOR -XR) 75 MG 24 hr capsule, Take 3 capsules (225 mg total) by mouth daily., Disp: 270 capsule, Rfl: 3  Past Medical History: Past Medical History:  Diagnosis Date   Abdominal pain, chronic, right lower quadrant 12/13/2020   Anterior urethral stricture    Anxiety    Arthritis    a. knees, hips, hands;  b. 11/2013 s/p L TKA @ [facility].   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 Dr Fernande   Depression    Diabetes mellitus without complication (HCC)    DVT (deep venous thrombosis) (HCC)    Erosive esophagitis    GERD (gastroesophageal reflux disease)    Gross hematuria    Hyperlipemia    Hypertension    borderline   Internal hemorrhoids    Orthostatic hypotension    Phimosis    Presence of permanent cardiac pacemaker    Pulmonary embolism (HCC)    RLS (restless legs syndrome)    Sensory neuropathy    Sleep apnea     Tobacco Use: Social History   Tobacco Use  Smoking Status Never   Passive exposure: Past  Smokeless Tobacco Never    Labs: Review Flowsheet  More data exists      Latest Ref Rng & Units 10/02/2022 01/01/2023 04/02/2023 07/05/2023 10/05/2023  Labs for ITP Cardiac and Pulmonary Rehab  Cholestrol 100 - 199 mg/dL 808  868  874  871  865   LDL (calc) 0 - 99 mg/dL 882  63  55  58  61   HDL-C >39 mg/dL 42  50  51  52  47   Trlycerides 0 - 149 mg/dL 816  95  899  93  848    Hemoglobin A1c 4.8 - 5.6 % 7.0  6.3  6.7  6.8  6.8      Pulmonary Assessment Scores:  Pulmonary Assessment Scores     Row Name 12/12/23 1201         ADL UCSD   ADL Phase Entry     SOB Score total 33     Rest 0  Walk 3     Stairs 5     Bath 3     Dress 0     Shop 3       CAT Score   CAT Score 12       mMRC Score   mMRC Score 3        UCSD: Self-administered rating of dyspnea associated with activities of daily living (ADLs) 6-point scale (0 = not at all to 5 = maximal or unable to do because of breathlessness)  Scoring Scores range from 0 to 120.  Minimally important difference is 5 units  CAT: CAT can identify the health impairment of COPD patients and is better correlated with disease progression.  CAT has a scoring range of zero to 40. The CAT score is classified into four groups of low (less than 10), medium (10 - 20), high (21-30) and very high (31-40) based on the impact level of disease on health status. A CAT score over 10 suggests significant symptoms.  A worsening CAT score could be explained by an exacerbation, poor medication adherence, poor inhaler technique, or progression of COPD or comorbid conditions.  CAT MCID is 2 points  mMRC: mMRC (Modified Medical Research Council) Dyspnea Scale is used to assess the degree of baseline functional disability in patients of respiratory disease due to dyspnea. No minimal important difference is established. A decrease in score of 1 point or greater is considered a positive change.   Pulmonary Function Assessment:  Pulmonary Function Assessment - 12/05/23 1411       Breath   Shortness of Breath Yes;Limiting activity          Exercise Target Goals: Exercise Program Goal: Individual exercise prescription set using results from initial 6 min walk test and THRR while considering  patient's activity barriers and safety.   Exercise Prescription Goal: Initial exercise prescription builds to 30-45 minutes a  day of aerobic activity, 2-3 days per week.  Home exercise guidelines will be given to patient during program as part of exercise prescription that the participant will acknowledge.  Education: Aerobic Exercise: - Group verbal and visual presentation on the components of exercise prescription. Introduces F.I.T.T principle from ACSM for exercise prescriptions.  Reviews F.I.T.T. principles of aerobic exercise including progression. Written material provided at class time.   Education: Resistance Exercise: - Group verbal and visual presentation on the components of exercise prescription. Introduces F.I.T.T principle from ACSM for exercise prescriptions  Reviews F.I.T.T. principles of resistance exercise including progression. Written material provided at class time.    Education: Exercise & Equipment Safety: - Individual verbal instruction and demonstration of equipment use and safety with use of the equipment. Flowsheet Row Pulmonary Rehab from 12/12/2023 in [facility] Cardiac and Pulmonary Rehab  Date 12/12/23  Educator [facility]  Instruction Review Code 1- Verbalizes Understanding    Education: Exercise Physiology & General Exercise Guidelines: - Group verbal and written instruction with models to review the exercise physiology of the cardiovascular system and associated critical values. Provides general exercise guidelines with specific guidelines to those with heart or lung disease.  Flowsheet Row Pulmonary Rehab from 12/07/2022 in [facility] Cardiac and Pulmonary Rehab  Date 11/09/22  Educator MB  Instruction Review Code 1- [employer] Understanding    Education: Flexibility, Balance, Mind/Body Relaxation: - Group verbal and visual presentation with interactive activity on the components of exercise prescription. Introduces F.I.T.T principle from ACSM for exercise prescriptions. Reviews F.I.T.T. principles of flexibility and balance exercise training including progression. Also discusses the  mind body  connection.  Reviews various relaxation techniques to help reduce and manage stress (i.e. Deep breathing, progressive muscle relaxation, and visualization). Balance handout provided to take home. Written material provided at class time.   Activity Barriers & Risk Stratification:  Activity Barriers & Cardiac Risk Stratification - 12/12/23 1141       Activity Barriers & Cardiac Risk Stratification   Activity Barriers Right Hip Replacement;Left Knee Replacement;Left Hip Replacement;Right Knee Replacement          6 Minute Walk:  6 Minute Walk     Row Name 12/12/23 1137         6 Minute Walk   Phase Initial     Distance 890 feet     Walk Time 6 minutes     # of Rest Breaks 0     MPH 1.69     METS 1.14     RPE 17     Perceived Dyspnea  3     VO2 Peak 3.99     Symptoms Yes (comment)     Comments SOB     Resting HR 90 bpm     Resting BP 128/82     Resting Oxygen Saturation  94 %     Exercise Oxygen Saturation  during 6 min walk 93 %     Max Ex. HR 107 bpm     Max Ex. BP 134/72     2 Minute Post BP 124/72       Interval HR   1 Minute HR 94     2 Minute HR 100     3 Minute HR 102     4 Minute HR 100     5 Minute HR 101     6 Minute HR 107     2 Minute Post HR 100     Interval Heart Rate? Yes       Interval Oxygen   Interval Oxygen? Yes     Baseline Oxygen Saturation % 94 %     1 Minute Oxygen Saturation % 94 %     1 Minute Liters of Oxygen 0 L     2 Minute Oxygen Saturation % 93 %     2 Minute Liters of Oxygen 0 L     3 Minute Oxygen Saturation % 93 %     3 Minute Liters of Oxygen 0 L     4 Minute Oxygen Saturation % 94 %     4 Minute Liters of Oxygen 0 L     5 Minute Oxygen Saturation % 96 %     5 Minute Liters of Oxygen 0 L     6 Minute Oxygen Saturation % 95 %     6 Minute Liters of Oxygen 0 L     2 Minute Post Oxygen Saturation % 95 %     2 Minute Post Liters of Oxygen 0 L       Oxygen Initial Assessment:  Oxygen Initial Assessment - 12/05/23 1411        Home Oxygen   Home Oxygen Device None    Sleep Oxygen Prescription CPAP    Liters per minute 0    Home Exercise Oxygen Prescription None    Home Resting Oxygen Prescription None    Compliance with Home Oxygen Use Yes      Initial 6 min Walk   Oxygen Used None      Program Oxygen Prescription   Program Oxygen Prescription None  Intervention   Short Term Goals To learn and exhibit compliance with exercise, home and travel O2 prescription;To learn and understand importance of maintaining oxygen saturations>88%;To learn and demonstrate proper use of respiratory medications;To learn and demonstrate proper pursed lip breathing techniques or other breathing techniques. ;To learn and understand importance of monitoring SPO2 with pulse oximeter and demonstrate accurate use of the pulse oximeter.    Long  Term Goals Verbalizes importance of monitoring SPO2 with pulse oximeter and return demonstration;Exhibits proper breathing techniques, such as pursed lip breathing or other method taught during program session;Demonstrates proper use of MDI's;Exhibits compliance with exercise, home  and travel O2 prescription;Maintenance of O2 saturations>88%;Compliance with respiratory medication          Oxygen Re-Evaluation:   Oxygen Discharge (Final Oxygen Re-Evaluation):   Initial Exercise Prescription:  Initial Exercise Prescription - 12/12/23 1100       Date of Initial Exercise RX and Referring Provider   Date 12/12/23    Referring Provider Aleskerov      Oxygen   Maintain Oxygen Saturation 88% or higher      NuStep   Level 2    SPM 80    Minutes 15    METs 1.14      REL-XR   Level 2    Speed 50    Minutes 15    METs 1.14      Biostep-RELP   Level 1    Minutes 15    METs 1.14      Track   Laps 10    Minutes 15    METs 1.54      Prescription Details   Frequency (times per week) 2    Duration Progress to 30 minutes of continuous aerobic without signs/symptoms of  physical distress      Intensity   THRR 40-80% of Max Heartrate 110-131    Ratings of Perceived Exertion 11-13    Perceived Dyspnea 0-4      Resistance Training   Training Prescription Yes    Weight 5    Reps 10-15          Perform Capillary Blood Glucose checks as needed.  Exercise Prescription Changes:   Exercise Prescription Changes     Row Name 12/12/23 1100             Response to Exercise   Blood Pressure (Admit) 128/82       Blood Pressure (Exercise) 134/72       Blood Pressure (Exit) 124/72       Heart Rate (Admit) 90 bpm       Heart Rate (Exercise) 107 bpm       Heart Rate (Exit) 95 bpm       Oxygen Saturation (Admit) 94 %       Oxygen Saturation (Exercise) 93 %       Oxygen Saturation (Exit) 95 %       Rating of Perceived Exertion (Exercise) 17       Perceived Dyspnea (Exercise) 3       Symptoms SOB       Comments 6 MWT results          Exercise Comments:   Exercise Goals and Review:   Exercise Goals     Row Name 12/12/23 1145             Exercise Goals   Increase Physical Activity Yes       Intervention Provide advice, education, support and counseling about physical  activity/exercise needs.;Develop an individualized exercise prescription for aerobic and resistive training based on initial evaluation findings, risk stratification, comorbidities and participant's personal goals.       Expected Outcomes Short Term: Attend rehab on a regular basis to increase amount of physical activity.;Long Term: Add in home exercise to make exercise part of routine and to increase amount of physical activity.;Long Term: Exercising regularly at least 3-5 days a week.       Increase Strength and Stamina Yes       Intervention Provide advice, education, support and counseling about physical activity/exercise needs.;Develop an individualized exercise prescription for aerobic and resistive training based on initial evaluation findings, risk stratification,  comorbidities and participant's personal goals.       Expected Outcomes Short Term: Increase workloads from initial exercise prescription for resistance, speed, and METs.;Short Term: Perform resistance training exercises routinely during rehab and add in resistance training at home;Long Term: Improve cardiorespiratory fitness, muscular endurance and strength as measured by increased METs and functional capacity ( )       Able to understand and use rate of perceived exertion (RPE) scale Yes       Intervention Provide education and explanation on how to use RPE scale       Expected Outcomes Short Term: Able to use RPE daily in rehab to express subjective intensity level;Long Term:  Able to use RPE to guide intensity level when exercising independently       Able to understand and use Dyspnea scale Yes       Intervention Provide education and explanation on how to use Dyspnea scale       Expected Outcomes Short Term: Able to use Dyspnea scale daily in rehab to express subjective sense of shortness of breath during exertion;Long Term: Able to use Dyspnea scale to guide intensity level when exercising independently       Knowledge and understanding of Target Heart Rate Range (THRR) Yes       Intervention Provide education and explanation of THRR including how the numbers were predicted and where they are located for reference       Expected Outcomes Short Term: Able to state/look up THRR;Long Term: Able to use THRR to govern intensity when exercising independently;Short Term: Able to use daily as guideline for intensity in rehab       Able to check pulse independently Yes       Intervention Provide education and demonstration on how to check pulse in carotid and radial arteries.;Review the importance of being able to check your own pulse for safety during independent exercise       Expected Outcomes Short Term: Able to explain why pulse checking is important during independent exercise;Long Term: Able to  check pulse independently and accurately       Understanding of Exercise Prescription Yes       Intervention Provide education, explanation, and written materials on patient's individual exercise prescription       Expected Outcomes Short Term: Able to explain program exercise prescription;Long Term: Able to explain home exercise prescription to exercise independently          Exercise Goals Re-Evaluation :   Discharge Exercise Prescription (Final Exercise Prescription Changes):  Exercise Prescription Changes - 12/12/23 1100       Response to Exercise   Blood Pressure (Admit) 128/82    Blood Pressure (Exercise) 134/72    Blood Pressure (Exit) 124/72    Heart Rate (Admit) 90 bpm    Heart Rate (  Exercise) 107 bpm    Heart Rate (Exit) 95 bpm    Oxygen Saturation (Admit) 94 %    Oxygen Saturation (Exercise) 93 %    Oxygen Saturation (Exit) 95 %    Rating of Perceived Exertion (Exercise) 17    Perceived Dyspnea (Exercise) 3    Symptoms SOB    Comments 6 MWT results          Nutrition:  Target Goals: Understanding of nutrition guidelines, daily intake of sodium 1500mg , cholesterol 200mg , calories 30% from fat and 7% or less from saturated fats, daily to have 5 or more servings of fruits and vegetables.  Education: Nutrition 1 -Group instruction provided by verbal, written material, interactive activities, discussions, models, and posters to present general guidelines for heart healthy nutrition including macronutrients, label reading, and promoting whole foods over processed counterparts. Education serves as pensions consultant of discussion of heart healthy eating for all. Written material provided at class time.     Education: Nutrition 2 -Group instruction provided by verbal, written material, interactive activities, discussions, models, and posters to present general guidelines for heart healthy nutrition including sodium, cholesterol, and saturated fat. Providing guidance of habit  forming to improve blood pressure, cholesterol, and body weight. Written material provided at class time.     Biometrics:  Pre Biometrics - 12/12/23 1145       Pre Biometrics   Height 5' 11 (1.803 m)    Weight 281 lb 1.6 oz (127.5 kg)    Waist Circumference 48.5 inches    Hip Circumference 54 inches    Waist to Hip Ratio 0.9 %    BMI (Calculated) 39.22    Single Leg Stand 2.2 seconds           Nutrition Therapy Plan and Nutrition Goals:  Nutrition Therapy & Goals - 12/12/23 1125       Nutrition Therapy   Diet Cardiac, Low Na    Protein (specify units) 75    Fiber 30 grams    Whole Grain Foods 3 servings    Saturated Fats 15 max. grams    Fruits and Vegetables 5 servings/day    Sodium 2 grams      Personal Nutrition Goals   Nutrition Goal Read labels and reduce sodium intake to below 2300mg . Ideally 1500mg  per day.    Personal Goal #2 Reduce saturated fat, less than 12g per day. Replace bad fats for more heart healthy fats.    Personal Goal #3 Eat 3 times per day, small frequent meals or nutrient dense snacks    Comments Spoke with tommy and his wife about heart health. Reviewed discussion from last RD visit ~1year ago. He is still working on drinking more water . He reports not eating as much food. Spoke with him about goal of eating protein and carb at each meal and trying to eat nutrient dense foods at each meal hour. Provided guideline limits of less than 12g saturated fat and less than 1500mg  sodium per day. Reviewed a dinner they like to make of baked chicken, canned green beans. Encouraged adding small carb like 3/4 cup rice or half baked potato as a complex carb.      Intervention Plan   Intervention Prescribe, educate and counsel regarding individualized specific dietary modifications aiming towards targeted core components such as weight, hypertension, lipid management, diabetes, heart failure and other comorbidities.;Nutrition handout(s) given to patient.     Expected Outcomes Short Term Goal: Understand basic principles of dietary content, such as calories, fat,  sodium, cholesterol and nutrients.;Short Term Goal: A plan has been developed with personal nutrition goals set during dietitian appointment.;Long Term Goal: Adherence to prescribed nutrition plan.          Nutrition Assessments:  MEDIFICTS Score Key: >=70 Need to make dietary changes  40-70 Heart Healthy Diet <= 40 Therapeutic Level Cholesterol Diet  Flowsheet Row Pulmonary Rehab from 12/12/2023 in [facility] Cardiac and Pulmonary Rehab  Picture Your Plate Total Score on Admission 57   Picture Your Plate Scores: <59 Unhealthy dietary pattern with much room for improvement. 41-50 Dietary pattern unlikely to meet recommendations for good health and room for improvement. 51-60 More healthful dietary pattern, with some room for improvement.  >60 Healthy dietary pattern, although there may be some specific behaviors that could be improved.   Nutrition Goals Re-Evaluation:   Nutrition Goals Discharge (Final Nutrition Goals Re-Evaluation):   Psychosocial: Target Goals: Acknowledge presence or absence of significant depression and/or stress, maximize coping skills, provide positive support system. Participant is able to verbalize types and ability to use techniques and skills needed for reducing stress and depression.   Education: Stress, Anxiety, and Depression - Group verbal and visual presentation to define topics covered.  Reviews how body is impacted by stress, anxiety, and depression.  Also discusses healthy ways to reduce stress and to treat/manage anxiety and depression.  Written material provided at class time. Flowsheet Row Pulmonary Rehab from 12/07/2022 in [facility] Cardiac and Pulmonary Rehab  Date 11/02/22  Educator SB  Instruction Review Code 1- [employer] Understanding    Education: Sleep Hygiene -Provides group verbal and written instruction about how sleep can affect your  health.  Define sleep hygiene, discuss sleep cycles and impact of sleep habits. Review good sleep hygiene tips.    Initial Review & Psychosocial Screening:  Initial Psych Review & Screening - 12/05/23 1414       Initial Review   Current issues with Current Psychotropic Meds;Current Sleep Concerns;Current Stress Concerns    Source of Stress Concerns Family      Family Dynamics   Good Support System? Yes    Comments Madeleine states his wife is a good support system for him. He has been lacking energy lately and wants to get back into the program. He takes some medication for his mood.      Barriers   Psychosocial barriers to participate in program The patient should benefit from training in stress management and relaxation.;There are no identifiable barriers or psychosocial needs.      Screening Interventions   Interventions Provide feedback about the scores to participant;To provide support and resources with identified psychosocial needs;Encouraged to exercise    Expected Outcomes Short Term goal: Utilizing psychosocial counselor, staff and physician to assist with identification of specific Stressors or current issues interfering with healing process. Setting desired goal for each stressor or current issue identified.;Long Term Goal: Stressors or current issues are controlled or eliminated.;Short Term goal: Identification and review with participant of any Quality of Life or Depression concerns found by scoring the questionnaire.;Long Term goal: The participant improves quality of Life and PHQ9 Scores as seen by post scores and/or verbalization of changes          Quality of Life Scores:  Scores of 19 and below usually indicate a poorer quality of life in these areas.  A difference of  2-3 points is a clinically meaningful difference.  A difference of 2-3 points in the total score of the Quality of Life Index has been  associated with significant improvement in overall quality of life,  self-image, physical symptoms, and general health in studies assessing change in quality of life.  PHQ-9: Review Flowsheet  More data exists      12/12/2023 12/04/2023 11/02/2023 10/05/2023 07/19/2023  Depression screen PHQ 2/9  Decreased Interest 0 0 0 0 0  Down, Depressed, Hopeless 0 0 0 0 0  PHQ - 2 Score 0 0 0 0 0  Altered sleeping 1 - - 0 -  Tired, decreased energy 3 - - 2 -  Change in appetite 0 - - 1 -  Feeling bad or failure about yourself  0 - - 0 -  Trouble concentrating 0 - - 0 -  Moving slowly or fidgety/restless 2 - - 0 -  Suicidal thoughts 0 - - 0 -  PHQ-9 Score 6 - - 3  -  Difficult doing work/chores Somewhat difficult - - Not difficult at all -    Details       Data saved with a previous flowsheet row definition        Interpretation of Total Score  Total Score Depression Severity:  1-4 = Minimal depression, 5-9 = Mild depression, 10-14 = Moderate depression, 15-19 = Moderately severe depression, 20-27 = Severe depression   Psychosocial Evaluation and Intervention:  Psychosocial Evaluation - 12/05/23 1416       Psychosocial Evaluation & Interventions   Interventions Relaxation education;Encouraged to exercise with the program and follow exercise prescription;Stress management education    Comments Madeleine states his wife is a good support system for him. He has been lacking energy lately and wants to get back into the program. He takes some medication for his mood.    Expected Outcomes Short: Start [facility] to help with mood. Long: Maintain a healthy mental state.    Continue Psychosocial Services  Follow up required by staff          Psychosocial Re-Evaluation:   Psychosocial Discharge (Final Psychosocial Re-Evaluation):   Education: Education Goals: Education classes will be provided on a weekly basis, covering required topics. Participant will state understanding/return demonstration of topics presented.  Learning Barriers/Preferences:   Learning Barriers/Preferences - 12/05/23 1414       Learning Barriers/Preferences   Learning Barriers None    Learning Preferences None          General Pulmonary Education Topics:  Infection Prevention: - Provides verbal and written material to individual with discussion of infection control including proper hand washing and proper equipment cleaning during exercise session. Flowsheet Row Pulmonary Rehab from 12/12/2023 in [facility] Card[facility] and Pulmonary Rehab  Date 12/12/23  Educator [facility]  Instruction Review Code 1- Verbalizes Understanding    Falls Prevention: - Provides verbal and written material to individual with discussion of falls prevention and safety. Flowsheet Row Pulmonary Rehab from 12/12/2023 in [facility] Cardiac and Pulmonary Rehab  Date 12/12/23  Educator [facility]  Instruction Review Code 1- Verbalizes Understanding    Chronic Lung Disease Review: - Group verbal instruction with posters, models, PowerPoint presentations and videos,  to review new updates, new respiratory medications, new advancements in procedures and treatments. Providing information on websites and 800 numbers for continued self-education. Includes information about supplement oxygen, available portable oxygen systems, continuous and intermittent flow rates, oxygen safety, concentrators, and Medicare reimbursement for oxygen. Explanation of Pulmonary Drugs, including class, frequency, complications, importance of spacers, rinsing mouth after steroid MDI's, and proper cleaning methods for nebulizers. Review of basic lung anatomy and physiology related to function, structure,  and complications of lung disease. Review of risk factors. Discussion about methods for diagnosing sleep apnea and types of masks and machines for OSA. Includes a review of the use of types of environmental controls: home humidity, furnaces, filters, dust mite/pet prevention, HEPA vacuums. Discussion about weather changes, air quality and the  benefits of nasal washing. Instruction on Warning signs, infection symptoms, calling MD promptly, preventive modes, and value of vaccinations. Review of effective airway clearance, coughing and/or vibration techniques. Emphasizing that all should Create an Action Plan. Written material provided at class time. Flowsheet Row Pulmonary Rehab from 12/12/2023 in [facility] Cardiac and Pulmonary Rehab  Education need identified 12/12/23    AED/CPR: - Group verbal and written instruction with the use of models to demonstrate the basic use of the AED with the basic ABC's of resuscitation.    Tests and Procedures:  - Group verbal and visual presentation and models provide information about basic cardiac anatomy and function. Reviews the testing methods done to diagnose heart disease and the outcomes of the test results. Describes the treatment choices: Medical Management, Angioplasty, or Coronary Bypass Surgery for treating various heart conditions including Myocardial Infarction, Angina, Valve Disease, and Cardiac Arrhythmias.  Written material provided at class time.   Medication Safety: - Group verbal and visual instruction to review commonly prescribed medications for heart and lung disease. Reviews the medication, class of the drug, and side effects. Includes the steps to properly store meds and maintain the prescription regimen.  Written material given at graduation.   Other: -Provides group and verbal instruction on various topics (see comments)   Knowledge Questionnaire Score:  Knowledge Questionnaire Score - 12/12/23 1200       Knowledge Questionnaire Score   Pre Score 14/18           Core Components/Risk Factors/Patient Goals at Admission:  Personal Goals and Risk Factors at Admission - 12/12/23 1156       Core Components/Risk Factors/Patient Goals on Admission    Weight Management Yes;Weight Loss    Intervention Weight Management: Develop a combined nutrition and exercise program  designed to reach desired caloric intake, while maintaining appropriate intake of nutrient and fiber, sodium and fats, and appropriate energy expenditure required for the weight goal.;Weight Management: Provide education and appropriate resources to help participant work on and attain dietary goals.;Weight Management/Obesity: Establish reasonable short term and long term weight goals.;Obesity: Provide education and appropriate resources to help participant work on and attain dietary goals.    Admit Weight 281 lb 1.6 oz (127.5 kg)    Goal Weight: Short Term 275 lb (124.7 kg)    Goal Weight: Long Term 270 lb (122.5 kg)    Expected Outcomes Short Term: Continue to assess and modify interventions until short term weight is achieved;Long Term: Adherence to nutrition and physical activity/exercise program aimed toward attainment of established weight goal;Weight Loss: Understanding of general recommendations for a balanced deficit meal plan, which promotes 1-2 lb weight loss per week and includes a negative energy balance of [phone] kcal/d;Understanding recommendations for meals to include 15-35% energy as protein, 25-35% energy from fat, 35-60% energy from carbohydrates, less than 200mg  of dietary cholesterol, 20-35 gm of total fiber daily;Understanding of distribution of calorie intake throughout the day with the consumption of 4-5 meals/snacks    Improve shortness of breath with ADL's Yes    Intervention Provide education, individualized exercise plan and daily activity instruction to help decrease symptoms of SOB with activities of daily living.    Expected  Outcomes Short Term: Improve cardiorespiratory fitness to achieve a reduction of symptoms when performing ADLs;Long Term: Be able to perform more ADLs without symptoms or delay the onset of symptoms    Diabetes Yes    Intervention Provide education about signs/symptoms and action to take for hypo/hyperglycemia.;Provide education about proper nutrition,  including hydration, and aerobic/resistive exercise prescription along with prescribed medications to achieve blood glucose in normal ranges: Fasting glucose 65-99 mg/dL    Expected Outcomes Short Term: Participant verbalizes understanding of the signs/symptoms and immediate care of hyper/hypoglycemia, proper foot care and importance of medication, aerobic/resistive exercise and nutrition plan for blood glucose control.;Long Term: Attainment of HbA1C < 7%.    Hypertension Yes    Intervention Provide education on lifestyle modifcations including regular physical activity/exercise, weight management, moderate sodium restriction and increased consumption of fresh fruit, vegetables, and low fat dairy, alcohol moderation, and smoking cessation.;Monitor prescription use compliance.    Expected Outcomes Short Term: Continued assessment and intervention until BP is < 140/67mm HG in hypertensive participants. < 130/58mm HG in hypertensive participants with diabetes, heart failure or chronic kidney disease.;Long Term: Maintenance of blood pressure at goal levels.    Lipids Yes    Intervention Provide education and support for participant on nutrition & aerobic/resistive exercise along with prescribed medications to achieve LDL 70mg , HDL >40mg .    Expected Outcomes Short Term: Participant states understanding of desired cholesterol values and is compliant with medications prescribed. Participant is following exercise prescription and nutrition guidelines.;Long Term: Cholesterol controlled with medications as prescribed, with individualized exercise RX and with personalized nutrition plan. Value goals: LDL < 70mg , HDL > 40 mg.          Education:Diabetes - Individual verbal and written instruction to review signs/symptoms of diabetes, desired ranges of glucose level fasting, after meals and with exercise. Acknowledge that pre and post exercise glucose checks will be done for 3 sessions at entry of  program. Flowsheet Row Pulmonary Rehab from 12/07/2022 in [facility] Cardiac and Pulmonary Rehab  Date 10/18/22  Educator [facility]  Instruction Review Code 1- Verbalizes Understanding    Know Your Numbers and Heart Failure: - Group verbal and visual instruction to discuss disease risk factors for cardiac and pulmonary disease and treatment options.  Reviews associated critical values for Overweight/Obesity, Hypertension, Cholesterol, and Diabetes.  Discusses basics of heart failure: signs/symptoms and treatments.  Introduces Heart Failure Zone chart for action plan for heart failure. Written material provided at class time. Flowsheet Row Pulmonary Rehab from 12/07/2022 in [facility] Cardiac and Pulmonary Rehab  Date 10/26/22  Educator [facility]  Instruction Review Code 1- Verbalizes Understanding    Core Components/Risk Factors/Patient Goals Review:    Core Components/Risk Factors/Patient Goals at Discharge (Final Review):    ITP Comments:  ITP Comments     Row Name 12/05/23 1422 12/12/23 1137         ITP Comments Virtual Visit completed. Patient informed on EP and RD appointment and 6 Minute walk test. Patient also informed of patient health questionnaires on My Chart. Patient Verbalizes understanding. Visit diagnosis can be found in [facility] 11/27/2023. Completed and gym orientation for respiratory care services. Initial ITP created and sent for review to Dr. Fuad Aleskerov, Medical Director.         Comments: initial ITP

## 2023-12-12 NOTE — Patient Instructions (Signed)
 Patient Instructions  Patient Details  Name: Nicholas [name] MRN: [id number] Date of Birth: 02/12/1945 Referring Provider:  [name] Manna, MD  Below are your personal goals for exercise, nutrition, and risk factors. Our goal is to help you stay on track towards obtaining and maintaining these goals. We will be discussing your progress on these goals with you throughout the program.  Initial Exercise Prescription:  Initial Exercise Prescription - 12/12/23 1100       Date of Initial Exercise RX and Referring Provider   Date 12/12/23    Referring Provider Aleskerov      Oxygen   Maintain Oxygen Saturation 88% or higher      NuStep   Level 2    SPM 80    Minutes 15    METs 1.14      REL-XR   Level 2    Speed 50    Minutes 15    METs 1.14      Biostep-RELP   Level 1    Minutes 15    METs 1.14      Track   Laps 10    Minutes 15    METs 1.54      Prescription Details   Frequency (times per week) 2    Duration Progress to 30 minutes of continuous aerobic without signs/symptoms of physical distress      Intensity   THRR 40-80% of Max Heartrate 110-131    Ratings of Perceived Exertion 11-13    Perceived Dyspnea 0-4      Resistance Training   Training Prescription Yes    Weight 5    Reps 10-15          Exercise Goals: Frequency: Be able to perform aerobic exercise two to three times per week in program working toward 2-5 days per week of home exercise.  Intensity: Work with a perceived exertion of 11 (fairly light) - 15 (hard) while following your exercise prescription.  We will make changes to your prescription with you as you progress through the program.   Duration: Be able to do 30 to 45 minutes of continuous aerobic exercise in addition to a 5 minute warm-up and a 5 minute cool-down routine.   Nutrition Goals: Your personal nutrition goals will be established when you do your nutrition analysis with the dietician.  The following are general nutrition  guidelines to follow: Cholesterol < 200mg /day Sodium < 1500mg /day Fiber: Men over 50 yrs - 30 grams per day  Personal Goals:  Personal Goals and Risk Factors at Admission - 12/12/23 1156       Core Components/Risk Factors/Patient Goals on Admission    Weight Management Yes;Weight Loss    Intervention Weight Management: Develop a combined nutrition and exercise program designed to reach desired caloric intake, while maintaining appropriate intake of nutrient and fiber, sodium and fats, and appropriate energy expenditure required for the weight goal.;Weight Management: Provide education and appropriate resources to help participant work on and attain dietary goals.;Weight Management/Obesity: Establish reasonable short term and long term weight goals.;Obesity: Provide education and appropriate resources to help participant work on and attain dietary goals.    Admit Weight 281 lb 1.6 oz (127.5 kg)    Goal Weight: Short Term 275 lb (124.7 kg)    Goal Weight: Long Term 270 lb (122.5 kg)    Expected Outcomes Short Term: Continue to assess and modify interventions until short term weight is achieved;Long Term: Adherence to nutrition and physical activity/exercise program aimed toward attainment  of established weight goal;Weight Loss: Understanding of general recommendations for a balanced deficit meal plan, which promotes 1-2 lb weight loss per week and includes a negative energy balance of [phone] kcal/d;Understanding recommendations for meals to include 15-35% energy as protein, 25-35% energy from fat, 35-60% energy from carbohydrates, less than 200mg  of dietary cholesterol, 20-35 gm of total fiber daily;Understanding of distribution of calorie intake throughout the day with the consumption of 4-5 meals/snacks    Improve shortness of breath with ADL's Yes    Intervention Provide education, individualized exercise plan and daily activity instruction to help decrease symptoms of SOB with activities of  daily living.    Expected Outcomes Short Term: Improve cardiorespiratory fitness to achieve a reduction of symptoms when performing ADLs;Long Term: Be able to perform more ADLs without symptoms or delay the onset of symptoms    Diabetes Yes    Intervention Provide education about signs/symptoms and action to take for hypo/hyperglycemia.;Provide education about proper nutrition, including hydration, and aerobic/resistive exercise prescription along with prescribed medications to achieve blood glucose in normal ranges: Fasting glucose 65-99 mg/dL    Expected Outcomes Short Term: Participant verbalizes understanding of the signs/symptoms and immediate care of hyper/hypoglycemia, proper foot care and importance of medication, aerobic/resistive exercise and nutrition plan for blood glucose control.;Long Term: Attainment of HbA1C < 7%.    Hypertension Yes    Intervention Provide education on lifestyle modifcations including regular physical activity/exercise, weight management, moderate sodium restriction and increased consumption of fresh fruit, vegetables, and low fat dairy, alcohol moderation, and smoking cessation.;Monitor prescription use compliance.    Expected Outcomes Short Term: Continued assessment and intervention until BP is < 140/41mm HG in hypertensive participants. < 130/34mm HG in hypertensive participants with diabetes, heart failure or chronic kidney disease.;Long Term: Maintenance of blood pressure at goal levels.    Lipids Yes    Intervention Provide education and support for participant on nutrition & aerobic/resistive exercise along with prescribed medications to achieve LDL 70mg , HDL >40mg .    Expected Outcomes Short Term: Participant states understanding of desired cholesterol values and is compliant with medications prescribed. Participant is following exercise prescription and nutrition guidelines.;Long Term: Cholesterol controlled with medications as prescribed, with individualized  exercise RX and with personalized nutrition plan. Value goals: LDL < 70mg , HDL > 40 mg.          Tobacco Use Initial Evaluation: Social History   Tobacco Use  Smoking Status Never   Passive exposure: Past  Smokeless Tobacco Never    Exercise Goals and Review:  Exercise Goals     Row Name 12/12/23 1145             Exercise Goals   Increase Physical Activity Yes       Intervention Provide advice, education, support and counseling about physical activity/exercise needs.;Develop an individualized exercise prescription for aerobic and resistive training based on initial evaluation findings, risk stratification, comorbidities and participant's personal goals.       Expected Outcomes Short Term: Attend rehab on a regular basis to increase amount of physical activity.;Long Term: Add in home exercise to make exercise part of routine and to increase amount of physical activity.;Long Term: Exercising regularly at least 3-5 days a week.       Increase Strength and Stamina Yes       Intervention Provide advice, education, support and counseling about physical activity/exercise needs.;Develop an individualized exercise prescription for aerobic and resistive training based on initial evaluation findings, risk stratification, comorbidities and participant's  personal goals.       Expected Outcomes Short Term: Increase workloads from initial exercise prescription for resistance, speed, and METs.;Short Term: Perform resistance training exercises routinely during rehab and add in resistance training at home;Long Term: Improve cardiorespiratory fitness, muscular endurance and strength as measured by increased METs and functional capacity ( )       Able to understand and use rate of perceived exertion (RPE) scale Yes       Intervention Provide education and explanation on how to use RPE scale       Expected Outcomes Short Term: Able to use RPE daily in rehab to express subjective intensity level;Long  Term:  Able to use RPE to guide intensity level when exercising independently       Able to understand and use Dyspnea scale Yes       Intervention Provide education and explanation on how to use Dyspnea scale       Expected Outcomes Short Term: Able to use Dyspnea scale daily in rehab to express subjective sense of shortness of breath during exertion;Long Term: Able to use Dyspnea scale to guide intensity level when exercising independently       Knowledge and understanding of Target Heart Rate Range (THRR) Yes       Intervention Provide education and explanation of THRR including how the numbers were predicted and where they are located for reference       Expected Outcomes Short Term: Able to state/look up THRR;Long Term: Able to use THRR to govern intensity when exercising independently;Short Term: Able to use daily as guideline for intensity in rehab       Able to check pulse independently Yes       Intervention Provide education and demonstration on how to check pulse in carotid and radial arteries.;Review the importance of being able to check your own pulse for safety during independent exercise       Expected Outcomes Short Term: Able to explain why pulse checking is important during independent exercise;Long Term: Able to check pulse independently and accurately       Understanding of Exercise Prescription Yes       Intervention Provide education, explanation, and written materials on patient's individual exercise prescription       Expected Outcomes Short Term: Able to explain program exercise prescription;Long Term: Able to explain home exercise prescription to exercise independently          Copy of goals given to participant.

## 2023-12-12 NOTE — Progress Notes (Signed)
 Assessment start time: 10:52 AM  Education r/t nutrition plan Spoke with Nicholas [name] and his wife about heart health. Reviewed discussion from last RD visit ~1year ago. He is still working on drinking more water . He reports not eating as much food. Spoke with him about goal of eating protein and carb at each meal and trying to eat nutrient dense foods at each meal hour. Provided guideline limits of less than 12g saturated fat and less than 1500mg  sodium per day. Reviewed a dinner they like to make of baked chicken, canned green beans. Encouraged adding small carb like 3/4 cup rice or half baked potato as a complex carb.    Goal 1: Read labels and reduce sodium intake to below 2300mg . Ideally 1500mg  per day.  Goal 2: Reduce saturated fat, less than 12g per day. Replace bad fats for more heart healthy fats.  Goal 3: Eat 3 times per day, small frequent meals or nutrient dense snacks   End time 11:14 AM

## 2023-12-13 ENCOUNTER — Ambulatory Visit (INDEPENDENT_AMBULATORY_CARE_PROVIDER_SITE_OTHER)

## 2023-12-13 DIAGNOSIS — L853 Xerosis cutis: Secondary | ICD-10-CM

## 2023-12-13 DIAGNOSIS — L821 Other seborrheic keratosis: Secondary | ICD-10-CM | POA: Diagnosis not present

## 2023-12-13 DIAGNOSIS — L578 Other skin changes due to chronic exposure to nonionizing radiation: Secondary | ICD-10-CM

## 2023-12-13 DIAGNOSIS — Z1283 Encounter for screening for malignant neoplasm of skin: Secondary | ICD-10-CM

## 2023-12-13 DIAGNOSIS — W908XXA Exposure to other nonionizing radiation, initial encounter: Secondary | ICD-10-CM

## 2023-12-13 DIAGNOSIS — L814 Other melanin hyperpigmentation: Secondary | ICD-10-CM

## 2023-12-13 DIAGNOSIS — L570 Actinic keratosis: Secondary | ICD-10-CM

## 2023-12-13 LAB — CUP PACEART REMOTE DEVICE CHECK
Battery Remaining Longevity: 137 mo
Battery Voltage: 3.13 V
Brady Statistic AP VP Percent: 44.29 %
Brady Statistic AP VS Percent: 0 %
Brady Statistic AS VP Percent: 55.68 %
Brady Statistic AS VS Percent: 0.02 %
Brady Statistic RA Percent Paced: 44.18 %
Brady Statistic RV Percent Paced: 99.98 %
Date Time Interrogation Session: 20251119022852
Implantable Lead Connection Status: 753985
Implantable Lead Connection Status: 753985
Implantable Lead Implant Date: 20180124
Implantable Lead Implant Date: 20250217
Implantable Lead Location: 753859
Implantable Lead Location: 753860
Implantable Lead Model: 3830
Implantable Lead Model: 5076
Implantable Pulse Generator Implant Date: 20250217
Lead Channel Impedance Value: 323 Ohm
Lead Channel Impedance Value: 361 Ohm
Lead Channel Impedance Value: 418 Ohm
Lead Channel Impedance Value: 665 Ohm
Lead Channel Pacing Threshold Amplitude: 0.5 V
Lead Channel Pacing Threshold Amplitude: 1 V
Lead Channel Pacing Threshold Pulse Width: 0.4 ms
Lead Channel Pacing Threshold Pulse Width: 0.4 ms
Lead Channel Sensing Intrinsic Amplitude: 26.25 mV
Lead Channel Sensing Intrinsic Amplitude: 27.5 mV
Lead Channel Sensing Intrinsic Amplitude: 3.375 mV
Lead Channel Sensing Intrinsic Amplitude: 3.375 mV
Lead Channel Setting Pacing Amplitude: 1.5 V
Lead Channel Setting Pacing Amplitude: 2.25 V
Lead Channel Setting Pacing Pulse Width: 0.4 ms
Lead Channel Setting Sensing Sensitivity: 0.9 mV
Zone Setting Status: 755011
Zone Setting Status: 755011

## 2023-12-13 NOTE — Progress Notes (Signed)
 Subjective   Nicholas [name] is a 78 y.o. male who presents for the following: Lesion(s) of concern . Patient is new patient  Today patient reports: Irregular skin lesions on the face, L axilla, R abdomen and R lower leg. Patient accompanied by wife who contributes to hx.  Review of Systems:    No other skin or systemic complaints except as noted in HPI or Assessment and Plan.  The following portions of the chart were reviewed this encounter and updated as appropriate: medications, allergies, medical history  Relevant Medical History:  n/a   Objective  (SKPE) Well appearing patient in no apparent distress; mood and affect are within normal limits. Examination was performed of the: Focused Exam of: the face, abdomen, arms, hands, and legs   Examination notable for: Lentigo/lentigines: Scattered pigmented macules that are tan to brown in color and are somewhat non-uniform in shape and concentrated in the sun-exposed areas, Seborrheic Keratosis(es): Stuck-on appearing keratotic papule(s) on the trunk, some  irritated with redness, crusting, edema, and/or partial avulsion, Actinic Damage/Elastosis: chronic sun damage: dyspigmentation, telangiectasia, and wrinkling, Actinic keratosis: Scaly erythematous macule(s) concentrated on sun exposed areas   Examination limited by: n/a   R lat lower extremity x 1 Erythematous thin papules/macules with gritty scale.   Assessment & Plan  (SKAP)   BENIGN SKIN FINDINGS  - Lentigines  - Seborrheic keratoses - Reassurance provided regarding the benign appearance of lesions noted on exam today; no treatment is indicated in the absence of symptoms/changes. - Reinforced importance of photoprotective strategies including liberal and frequent sunscreen use of a broad-spectrum SPF 30 or greater, use of protective clothing, and sun avoidance for prevention of cutaneous malignancy and photoaging.  Counseled patient on the importance of regular self-skin  monitoring as well as routine clinical skin examinations as scheduled.   ACTINIC DAMAGE - Chronic condition, secondary to cumulative UV/sun exposure - Recommend daily broad spectrum sunscreen SPF 30+ to sun-exposed areas, reapply every 2 hours as needed.  - Staying in the shade or wearing long sleeves, sun glasses (UVA+UVB protection) and wide brim hats (4-inch brim around the entire circumference of the hat) are also recommended for sun protection.  - Call for new or changing lesions.  Xerosis cutis - Discussed diagnosis, typical course, and treatment options for this condition - The patient was advised to use a gentle cleanser such as Dove or Cetaphil and to avoid anti-bacterial soaps which may be too irritating. We recommended the frequent use of a greasy emollient such as Cetaphil, Cerave or Vaseline to moisturize the skin once to twice daily. We also recommended the avoidance of scratching and irritants to the skin.  Level of service outlined above   Patient instructions (SKPI)   Procedures, orders, diagnosis for this visit:  AK (ACTINIC KERATOSIS) R lat lower extremity x 1 Actinic keratoses are precancerous spots that appear secondary to cumulative UV radiation exposure/sun exposure over time. They are chronic with expected duration over 1 year. A portion of actinic keratoses will progress to squamous cell carcinoma of the skin. It is not possible to reliably predict which spots will progress to skin cancer and so treatment is recommended to prevent development of skin cancer.  Recommend daily broad spectrum sunscreen SPF 30+ to sun-exposed areas, reapply every 2 hours as needed.  Recommend staying in the shade or wearing long sleeves, sun glasses (UVA+UVB protection) and wide brim hats (4-inch brim around the entire circumference of the hat). Call for new or changing lesions.  Destruction of lesion - R lat lower extremity x 1 Complexity: simple   Destruction method: cryotherapy    Informed consent: discussed and consent obtained   Timeout:  patient name, date of birth, surgical site, and procedure verified Lesion destroyed using liquid nitrogen: Yes   Region frozen until ice ball extended beyond lesion: Yes   Cryo cycles: 1 or 2. Outcome: patient tolerated procedure well with no complications   Post-procedure details: wound care instructions given     AK (actinic keratosis) -     Destruction of lesion   Return to clinic: Return if symptoms worsen or fail to improve.  LILLETTE Rosina Mayans, CMA, am acting as scribe for Lauraine JAYSON Kanaris, MD .   Documentation: I have reviewed the above documentation for accuracy and completeness, and I agree with the above.  Lauraine JAYSON Kanaris, MD

## 2023-12-13 NOTE — Patient Instructions (Signed)

## 2023-12-14 NOTE — Progress Notes (Signed)
 Remote PPM Transmission

## 2023-12-16 ENCOUNTER — Ambulatory Visit: Payer: Self-pay | Admitting: Cardiology

## 2023-12-17 ENCOUNTER — Encounter

## 2023-12-17 DIAGNOSIS — R0600 Dyspnea, unspecified: Secondary | ICD-10-CM

## 2023-12-17 NOTE — Progress Notes (Signed)
 Daily Session Note  Patient Details  Name: Nicholas [name] MRN: [id number] Date of Birth: 03/10/1945 Referring Provider:   Flowsheet Row Pulmonary Rehab from 12/12/2023 in [facility] Cardiac and Pulmonary Rehab  Referring Provider [name]    Encounter Date: 12/17/2023  Check In:  Session Check In - 12/17/23 1121       Check-In   Supervising physician immediately available to respond to emergencies See telemetry face sheet for immediately available ER MD    Location [facility]-Cardiac & Pulmonary Rehab    Staff Present Burnard Davenport RN,BSN,MPA;Joseph Rolinda RCP,RRT,BSRT;Laura Cates RN,BSN;Aeon Koors Dyane BS, ACSM CEP, Exercise Physiologist    Virtual Visit No    Medication changes reported     No    Fall or balance concerns reported    No    Warm-up and Cool-down Performed on first and last piece of equipment    Resistance Training Performed Yes    VAD Patient? No    PAD/SET Patient? No      Pain Assessment   Currently in Pain? No/denies             Social History   Tobacco Use  Smoking Status Never   Passive exposure: Past  Smokeless Tobacco Never    Goals Met:  Proper associated with RPD/PD & O2 Sat Independence with exercise equipment Using PLB without cueing & demonstrates good technique Exercise tolerated well No report of concerns or symptoms today Strength training completed today  Goals Unmet:  Not Applicable  Comments: First full day of exercise!  Patient was oriented to gym and equipment including functions, settings, policies, and procedures.  Patient's individual exercise prescription and treatment plan were reviewed.  All starting workloads were established based on the results of the 6 minute walk test done at initial orientation visit.  The plan for exercise progression was also introduced and progression will be customized based on patient's performance and goals.    Dr. Oneil Pinal is Medical Director for [facility] Cardiac Rehabilitation.  Dr. Fuad  Aleskerov is Medical Director for [facility] Pulmonary Rehabilitation.

## 2023-12-18 ENCOUNTER — Ambulatory Visit: Admitting: Physician Assistant

## 2023-12-18 VITALS — BP 128/81 | HR 83 | Ht 70.0 in | Wt 281.0 lb

## 2023-12-18 DIAGNOSIS — N35812 Other urethral bulbous stricture, male: Secondary | ICD-10-CM

## 2023-12-18 DIAGNOSIS — N390 Urinary tract infection, site not specified: Secondary | ICD-10-CM | POA: Diagnosis not present

## 2023-12-18 LAB — MICROSCOPIC EXAMINATION: WBC, UA: >30 /HPF — AB (ref 0–5)

## 2023-12-18 LAB — URINALYSIS, COMPLETE
Bilirubin, UA: NEGATIVE
Glucose, UA: NEGATIVE
Nitrite, UA: POSITIVE — AB
Specific Gravity, UA: 1.02 (ref 1.005–1.030)
Urobilinogen, Ur: 1 mg/dL (ref 0.2–1.0)
pH, UA: 6 (ref 5.0–7.5)

## 2023-12-18 LAB — BLADDER SCAN AMB NON-IMAGING

## 2023-12-18 MED ORDER — SULFAMETHOXAZOLE-TRIMETHOPRIM 800-160 MG PO TABS: 1.0000 | ORAL_TABLET | Freq: Two times a day (BID) | ORAL | 0 refills | Status: AC

## 2023-12-18 NOTE — Progress Notes (Signed)
 12/18/2023 11:04 AM   Nicholas [name] [birth year] [id number]  CC: Chief Complaint  Patient presents with   Follow-up   HPI: Nicholas [name] is a 78 y.o. 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 Francisca on 09/17/2023 who presents today for postop follow-up.   Today he reports he is doing great for the most part since surgery with no urinary problems.  He completed suppressive trimethoprim  prior to surgery and has been off antibiotics for at least 3 months.  Over the past 10 days he has had increasing frequency and discomfort with voiding.  His stream is unaffected.  In-office UA today positive for 1+ protein, 3+ blood, nitrates, trace ketones, and 2+ leukocytes; urine microscopy with >30 WBCs/HPF, >30 RBCs/HPF, and many bacteria. PVR 6mL.  PMH: Past Medical History:  Diagnosis Date   Abdominal pain, chronic, right lower quadrant 12/13/2020   Anterior urethral stricture    Anxiety    Arthritis    a. knees, hips, hands;  b. 11/2013 s/p L TKA @ [facility].   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 Dr Fernande   Depression    Diabetes mellitus without complication (HCC)    DVT (deep venous thrombosis) (HCC)    Erosive esophagitis    GERD (gastroesophageal reflux disease)    Gross hematuria    Hyperlipemia    Hypertension    borderline   Internal hemorrhoids    Orthostatic hypotension    Phimosis    Presence of permanent cardiac pacemaker    Pulmonary embolism (HCC)    RLS (restless legs syndrome)    Sensory neuropathy    Sleep apnea     Surgical History: Past Surgical History:  Procedure Laterality Date   ATRIAL FIBRILLATION ABLATION N/A 05/17/2020   Procedure: ATRIAL FIBRILLATION ABLATION;   Surgeon: Cindie Ole ONEIDA, MD;  Location: [facility] INVASIVE CV LAB;  Service: Cardiovascular;  Laterality: N/A;   BUNIONECTOMY Bilateral 01/06/2015   Procedure: ROMAYNE;  Surgeon: Kayla Pinal, MD;  Location: [facility] ORS;  Service: Orthopedics;  Laterality: Bilateral;   CARDIAC CATHETERIZATION  ~ 2005   once   CATARACT EXTRACTION W/ INTRAOCULAR LENS  IMPLANT, BILATERAL Bilateral ~ 2010   COLONOSCOPY WITH PROPOFOL  N/A 12/07/2015   Procedure: COLONOSCOPY WITH PROPOFOL ;  Surgeon: Gladis RAYMOND Mariner, MD;  Location: [facility] ENDOSCOPY;  Service: Endoscopy;  Laterality: N/A;   COLONOSCOPY WITH PROPOFOL  N/A 11/26/2020   Procedure: COLONOSCOPY WITH PROPOFOL ;  Surgeon: Unk Corinn Skiff, MD;  Location: [facility] ENDOSCOPY;  Service: Gastroenterology;  Laterality: N/A;   CYSTOSCOPY WITH RETROGRADE URETHROGRAM N/A 09/17/2023   Procedure: CYSTOSCOPY WITH RETROGRADE URETHROGRAM;  Surgeon: Francisca Redell BROCKS, MD;  Location: [facility] ORS;  Service: Urology;  Laterality: N/A;   EP IMPLANTABLE DEVICE N/A 02/16/2016   MDT dual chamber (His Bundle) pacemaker implanted by Dr Fernande for intermittent complete heart block   ESOPHAGOGASTRODUODENOSCOPY (EGD) WITH PROPOFOL  N/A 12/07/2015   Procedure: ESOPHAGOGASTRODUODENOSCOPY (EGD) WITH PROPOFOL ;  Surgeon: Gladis RAYMOND Mariner, MD;  Location: [facility] ENDOSCOPY;  Service: Endoscopy;  Laterality: N/A;   ESOPHAGOGASTRODUODENOSCOPY (EGD) WITH PROPOFOL  N/A 05/17/2017   Procedure: ESOPHAGOGASTRODUODENOSCOPY (EGD) WITH PROPOFOL ;  Surgeon: Mariner Gladis RAYMOND, MD;  Location: [facility] ENDOSCOPY;  Service: Endoscopy;  Laterality:  N/A;   EYE SURGERY     HAMMER TOE SURGERY Bilateral 01/06/2015   Procedure: HAMMER TOE CORRECTION;  Surgeon: Kayla Pinal, MD;  Location: [facility] ORS;  Service: Orthopedics;  Laterality: Bilateral;   JOINT REPLACEMENT     KNEE CARTILAGE SURGERY Left 1965   football injury   LEAD EXTRACTION N/A 03/12/2023   Procedure: LEAD EXTRACTION;  Surgeon: Cindie Ole DASEN, MD;   Location: [facility] INVASIVE CV LAB;  Service: Cardiovascular;  Laterality: N/A;   Left Total Knee Arthroplasty     a. 11/2013 [facility].   PACEMAKER IMPLANT  2017   PACEMAKER IMPLANT N/A 03/12/2023   Procedure: PACEMAKER IMPLANT;  Surgeon: Cindie Ole DASEN, MD;  Location: [facility] INVASIVE CV LAB;  Service: Cardiovascular;  Laterality: N/A;   PILONIDAL CYST EXCISION  1970's   TOTAL HIP ARTHROPLASTY Right 2004   TOTAL HIP ARTHROPLASTY Left 2006   TOTAL KNEE ARTHROPLASTY Right    UPPER GI ENDOSCOPY      Home Medications:  Allergies as of 12/18/2023   No Known Allergies      Medication List        Accurate as of December 18, 2023 11:04 AM. If you have any questions, ask your nurse or doctor.          acetaminophen  650 MG CR tablet Commonly known as: TYLENOL  Take 650 mg by mouth every 8 (eight) hours as needed for pain.   albuterol  108 (90 Base) MCG/ACT inhaler Commonly known as: VENTOLIN  HFA Inhale 2 puffs into the lungs.   apixaban  5 MG Tabs tablet Commonly known as: Eliquis  Take 1 tablet (5 mg total) by mouth 2 (two) times daily.   calcium -vitamin D  500-5 MG-MCG tablet Commonly known as: OSCAL WITH D Take 1 tablet by mouth daily with breakfast.   Cranberry 500 MG Caps Take 500 mg by mouth daily.   docusate 50 MG/5ML liquid Commonly known as: COLACE Take 50 mg by mouth daily.   Fish Oil 1200 MG Caps Take 1,200 mg by mouth daily.   gabapentin  600 MG tablet Commonly known as: NEURONTIN  Take 1 tablet (600 mg total) by mouth at bedtime.   glucose blood test strip Use to check blood sugar 3 times a day and document results, bring to appointments.  Goal is <130 fasting blood sugar and <180 two hours after meals.   HYDROcodone -acetaminophen  5-325 MG tablet Commonly known as: NORCO/VICODIN Take 1 tablet by mouth 2 (two) times daily.   HYDROcodone -acetaminophen  5-325 MG tablet Commonly known as: NORCO/VICODIN Take 1 tablet by mouth 2 (two) times daily. Start taking on:  December 28, 2023   HYDROcodone -acetaminophen  5-325 MG tablet Commonly known as: NORCO/VICODIN Take 1 tablet by mouth 2 (two) times daily. Start taking on: January 27, 2024   LORazepam  1 MG tablet Commonly known as: ATIVAN  TAKE 1 TABLET BY MOUTH EVERYDAY AT BEDTIME   Magnesium  400 MG Tabs Take 400 mg by mouth daily.   MELATONIN PO Take 10 mg by mouth at bedtime.   midodrine  5 MG tablet Commonly known as: PROAMATINE  Take 10 mg in the morning, take 5 mg in the afternoon, and 5 mg in the evening   multivitamin with minerals Tabs tablet Take 1 tablet by mouth daily. Centrum Silver   NON FORMULARY Pt uses a cpap nightly   nystatin  cream Commonly known as: MYCOSTATIN  Apply 1 Application topically 2 (two) times daily.   onetouch ultrasoft lancets Use to check blood sugar 3 times a day and document results, bring to appointments.  Goal is <130 fasting blood sugar and <180 two hours after meals.   OneTouch Verio w/Device Kit Use to check blood sugar 3 times a day and document results, bring to appointments.  Goal is <130 fasting blood sugar and <180 two hours after meals.   oxymetazoline  0.05 % nasal spray Commonly known as: AFRIN Place 2 sprays into both nostrils at bedtime.   pantoprazole  20 MG tablet Commonly known as: PROTONIX  Take 1 tablet (20 mg total) by mouth daily.   potassium chloride  SA 20 MEQ tablet Commonly known as: KLOR-CON  M Take 2 tablets (40 meq) with Torsemide  What changed:  how much to take how to take this when to take this reasons to take this additional instructions   QUEtiapine  Fumarate 150 MG 24 hr tablet Commonly known as: SEROQUEL  XR Take 1 tablet (150 mg total) by mouth at bedtime.   rOPINIRole  0.25 MG tablet Commonly known as: REQUIP  Take 0.25 mg by mouth 4 (four) times daily.   rosuvastatin  40 MG tablet Commonly known as: CRESTOR  Take 1 tablet (40 mg total) by mouth daily.   solifenacin  10 MG tablet Commonly known as:  VESICARE  Take 1 tablet (10 mg total) by mouth daily.   tiZANidine  4 MG tablet Commonly known as: Zanaflex  Take 1 tablet (4 mg total) by mouth every 6 (six) hours as needed for muscle spasms.   torsemide  20 MG tablet Commonly known as: DEMADEX  TAKE 2 TABLETS (40 MG TOTAL) BY MOUTH DAILY AS NEEDED (FLUID RETENTION).   venlafaxine  XR 75 MG 24 hr capsule Commonly known as: EFFEXOR -XR Take 3 capsules (225 mg total) by mouth daily.   vitamin C  1000 MG tablet Take 1,000 mg by mouth daily.        Allergies:  No Known Allergies  Family History: Family History  Problem Relation Age of Onset   Alcoholism Mother        died in her 42's.   Cancer Mother    Stroke Father        deceased   Diabetes Father    Hypertension Father    Glaucoma Sister    Breast cancer Sister    Diabetes Daughter    Asthma Son    Stroke Paternal Grandmother    Prostate cancer Paternal Grandfather     Social History:   reports that he has never smoked. He has been exposed to tobacco smoke. He has never used smokeless tobacco. He reports that he does not currently use alcohol. He reports current drug use. Drug: Other-see comments.  Physical Exam: BP 128/81 (BP Location: Left Arm, Patient Position: Sitting, Cuff Size: Large)   Pulse 83   Ht 5' 10 (1.778 m)   Wt 281 lb (127.5 kg)   SpO2 94%   BMI 40.32 kg/m   Constitutional:  Alert and oriented, no acute distress, nontoxic appearing HEENT: [place], AT Cardiovascular: No clubbing, cyanosis, or edema Respiratory: Normal respiratory effort, no increased work of breathing Skin: No rashes, bruises or suspicious lesions Neurologic: Grossly intact, no focal deficits, moving all 4 extremities Psychiatric: Normal mood and affect  Laboratory Data: Results for orders placed or performed in visit on 12/18/23  BLADDER SCAN AMB NON-IMAGING   Collection Time: 12/18/23 11:18 AM  Result Value Ref Range   Scan Result 6ml   Microscopic Examination   Collection  Time: 12/18/23 11:36 AM   Urine  Result Value Ref Range   WBC, UA >30 (A) 0 - 5 /hpf   RBC, Urine 3-10 (A) 0 -  2 /hpf   Epithelial Cells (non renal) 0-10 0 - 10 /hpf   Bacteria, UA Many (A) None seen/Few  Urinalysis, Complete   Collection Time: 12/18/23 11:36 AM  Result Value Ref Range   Specific Gravity, UA 1.020 1.005 - 1.030   pH, UA 6.0 5.0 - 7.5   Color, UA Yellow Yellow   Appearance Ur Cloudy (A) Clear   Leukocytes,UA 2+ (A) Negative   Protein,UA 1+ (A) Negative/Trace   Glucose, UA Negative Negative   Ketones, UA Trace (A) Negative   RBC, UA 1+ (A) Negative   Bilirubin, UA Negative Negative   Urobilinogen, Ur 1.0 0.2 - 1.0 mg/dL   Nitrite, UA Positive (A) Negative   Microscopic Examination See below:    *Note: Due to a large number of results and/or encounters for the requested time period, some results have not been displayed. A complete set of results can be found in Results Review.   Assessment & Plan:   1. Recurrent UTI (Primary) UA appears grossly infected, will start empiric Bactrim  and send for culture for further evaluation.  We discussed that if his symptoms do not resolve with culture appropriate antibiotics, I want to see him back so that we can work him up for possible recurrence of #2 below. - Urinalysis, Complete - CULTURE, URINE COMPREHENSIVE - sulfamethoxazole -trimethoprim  (BACTRIM  DS) 800-160 MG tablet; Take 1 tablet by mouth 2 (two) times daily for 7 days.  Dispense: 14 tablet; Refill: 0  2. 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  He is emptying appropriately.  Will continue to monitor or consider repeat cystoscopy if his symptoms do not resolve as above. - BLADDER SCAN AMB NON-IMAGING   Return for Keep follow-up as scheduled.  Lucie Hones, PA-C  [facility] Urology [place] [address] [place], [place] [address] [phone]

## 2023-12-18 NOTE — Progress Notes (Unsigned)
  Electrophysiology Office Follow up Visit Note:    Date:  12/19/2023   ID:  Nicholas [name], DOB [birth year], MRN [id number]  PCP:  Valerio Melanie ONEIDA, NP  [facility] Cardiologist:  None  [facility] Electrophysiologist:  OLE ONEIDA HOLTS, MD    Interval History:     Nicholas [name] is a 78 y.o. male who presents for a follow up visit.   The patient saw Elvie Jun 13, 2023.  He has a history of sinus node dysfunction with a permanent pacemaker.  He also has a history of persistent atrial fibrillation, hypertension, PE, DVT on Eliquis , sleep apnea.  He has had an A-fib ablation in the past.  He had a lead extraction of the His bundle lead and implant of the left bundle area lead in February 2025.  At the last appointment with Elvie he was doing well.  They inquired about left atrial appendage occlusion at the appointment with Elvie.  He is doing quite well today.  Of he is with his wife today in clinic wife previously met.  He has not been taking his midodrine  for the last 3 days because of stable blood pressures.  He has recently restarted cardiac rehab.      Past medical, surgical, social and family history were reviewed.  ROS:   Please see the history of present illness.    All other systems reviewed and are negative.  EKGs/Labs/Other Studies Reviewed:    The following studies were reviewed today:  December 19, 2023 in-clinic device interrogation personally reviewed Battery and lead parameters stable.  RV sensitivity settings adjusted.        Physical Exam:    VS:  BP 132/76 (BP Location: Left Arm, Patient Position: Sitting, Cuff Size: Normal)   Ht 5' 11 (1.803 m)   Wt 280 lb (127 kg)   SpO2 95%   BMI 39.05 kg/m     Wt Readings from Last 3 Encounters:  12/19/23 280 lb (127 kg)  12/18/23 281 lb (127.5 kg)  12/12/23 281 lb 1.6 oz (127.5 kg)     GEN: no distress CARD: RRR, No MRG.  Generator pocket well-healed RESP: No IWOB. CTAB.      ASSESSMENT:     1. Sinus node dysfunction (HCC)   2. Cardiac pacemaker in situ   3. Persistent atrial fibrillation (HCC)    PLAN:    In order of problems listed above:  #Sinus node dysfunction #Permanent pacemaker in situ Device functioning appropriately.  Continue remote monitoring  #Persistent atrial fibrillation Continue Eliquis  twice daily for stroke prophylaxis Given history of chronic DVT and PE, I do not think he is a good candidate for left atrial appendage occlusion  I discussed my upcoming departure from [facility] during today's clinic appointment.  The patient will continue to follow-up with one of my EP partners moving forward.  Follow-up 1 year with EP APP  Signed, Ole Holts, MD, [facility], [facility] 12/19/2023 3:08 PM    Electrophysiology [facility]

## 2023-12-19 ENCOUNTER — Encounter: Admitting: Emergency Medicine

## 2023-12-19 ENCOUNTER — Ambulatory Visit: Attending: Cardiology | Admitting: Cardiology

## 2023-12-19 ENCOUNTER — Encounter: Payer: Self-pay | Admitting: Cardiology

## 2023-12-19 VITALS — BP 132/76 | Ht 71.0 in | Wt 280.0 lb

## 2023-12-19 DIAGNOSIS — R0600 Dyspnea, unspecified: Secondary | ICD-10-CM | POA: Diagnosis not present

## 2023-12-19 DIAGNOSIS — I4819 Other persistent atrial fibrillation: Secondary | ICD-10-CM | POA: Diagnosis not present

## 2023-12-19 DIAGNOSIS — I495 Sick sinus syndrome: Secondary | ICD-10-CM | POA: Diagnosis not present

## 2023-12-19 DIAGNOSIS — Z950 Presence of cardiac pacemaker: Secondary | ICD-10-CM

## 2023-12-19 LAB — CUP PACEART INCLINIC DEVICE CHECK
Date Time Interrogation Session: 20251126152313
Implantable Lead Connection Status: 753985
Implantable Lead Connection Status: 753985
Implantable Lead Implant Date: 20180124
Implantable Lead Implant Date: 20250217
Implantable Lead Location: 753859
Implantable Lead Location: 753860
Implantable Lead Model: 3830
Implantable Lead Model: 5076
Implantable Pulse Generator Implant Date: 20250217

## 2023-12-19 NOTE — Patient Instructions (Signed)

## 2023-12-19 NOTE — Progress Notes (Signed)
 Daily Session Note  Patient Details  Name: Nicholas [name] MRN: [id number] Date of Birth: October 20, 1945 Referring Provider:   Flowsheet Row Pulmonary Rehab from 12/12/2023 in [facility] Cardiac and Pulmonary Rehab  Referring Provider [name]    Encounter Date: 12/19/2023  Check In:  Session Check In - 12/19/23 1104       Check-In   Supervising physician immediately available to respond to emergencies See telemetry face sheet for immediately available ER MD    Location [facility]-Cardiac & Pulmonary Rehab    Staff Present Leita Franks RN,BSN;Meredith Tressa RN,BSN;Kelly Bollinger RN,BSN,MPA;Maxon Conetta BS, Exercise Physiologist    Virtual Visit No    Medication changes reported     No    Fall or balance concerns reported    No    Warm-up and Cool-down Performed on first and last piece of equipment    Resistance Training Performed Yes    VAD Patient? No    PAD/SET Patient? No      Pain Assessment   Currently in Pain? No/denies             Social History   Tobacco Use  Smoking Status Never   Passive exposure: Past  Smokeless Tobacco Never    Goals Met:  Proper associated with RPD/PD & O2 Sat Independence with exercise equipment Using PLB without cueing & demonstrates good technique Exercise tolerated well No report of concerns or symptoms today Strength training completed today  Goals Unmet:  Not Applicable  Comments: Pt able to follow exercise prescription today without complaint.  Will continue to monitor for progression.    Dr. Oneil Pinal is Medical Director for [facility] Cardiac Rehabilitation.  Dr. Fuad Aleskerov is Medical Director for [facility] Pulmonary Rehabilitation.

## 2023-12-22 LAB — CULTURE, URINE COMPREHENSIVE

## 2023-12-24 ENCOUNTER — Encounter

## 2023-12-24 ENCOUNTER — Ambulatory Visit: Payer: Self-pay | Admitting: Cardiology

## 2023-12-24 DIAGNOSIS — R0600 Dyspnea, unspecified: Secondary | ICD-10-CM | POA: Insufficient documentation

## 2023-12-24 NOTE — Progress Notes (Signed)
 Daily Session Note  Patient Details  Name: DEYVI BONANNO MRN: [id number] Date of Birth: 02-Feb-1945 Referring Provider:   Flowsheet Row Pulmonary Rehab from 12/12/2023 in [facility] Cardiac and Pulmonary Rehab  Referring Provider [name]    Encounter Date: 12/24/2023  Check In:  Session Check In - 12/24/23 1108       Check-In   Supervising physician immediately available to respond to emergencies See telemetry face sheet for immediately available ER MD    Location [facility]-Cardiac & Pulmonary Rehab    Staff Present Burnard Davenport RN,BSN,MPA;Joseph Rolinda RCP,RRT,BSRT;Laura Cates RN,BSN;Carisa Backhaus Dyane BS, ACSM CEP, Exercise Physiologist    Virtual Visit No    Medication changes reported     No    Fall or balance concerns reported    No    Warm-up and Cool-down Performed on first and last piece of equipment    Resistance Training Performed Yes    VAD Patient? No    PAD/SET Patient? No      Pain Assessment   Currently in Pain? No/denies             Social History   Tobacco Use  Smoking Status Never   Passive exposure: Past  Smokeless Tobacco Never    Goals Met:  Proper associated with RPD/PD & O2 Sat Independence with exercise equipment Using PLB without cueing & demonstrates good technique Exercise tolerated well No report of concerns or symptoms today Strength training completed today  Goals Unmet:  Not Applicable  Comments: Pt able to follow exercise prescription today without complaint.  Will continue to monitor for progression.    Dr. Oneil Pinal is Medical Director for [facility] Cardiac Rehabilitation.  Dr. Fuad Aleskerov is Medical Director for [facility] Pulmonary Rehabilitation.

## 2023-12-25 ENCOUNTER — Ambulatory Visit: Payer: Self-pay | Admitting: Physician Assistant

## 2023-12-25 NOTE — Progress Notes (Signed)
 Subjective:  Patient ID: Nicholas [name], male    DOB: [birth year]  Age: 78 y.o. MRN: [id number]  CC: Back Pain   Procedure: None  HPI Nicholas [name] presents for return evaluation.  He was last seen 2 months ago and continues to have problems with the low back and legs.  The quality characteristic and distribution of the pain he is describing is stable without change.  He continues to take his medications as prescribed and these continue to give him good relief rated at about 75 to 80% relief lasting 4 to 6 hours contingent on how active he is.  He tries to stay active with his golf game and daily activities as best he can.  He has failed more conservative therapy and feels the medication management with the opioids is beneficial.  Otherwise he is in his usual state of health.  Outpatient Medications Prior to Visit  Medication Sig Dispense Refill   acetaminophen  (TYLENOL ) 650 MG CR tablet Take 650 mg by mouth every 8 (eight) hours as needed for pain.     albuterol  (VENTOLIN  HFA) 108 (90 Base) MCG/ACT inhaler Inhale 2 puffs into the lungs.     apixaban  (ELIQUIS ) 5 MG TABS tablet Take 1 tablet (5 mg total) by mouth 2 (two) times daily. 180 tablet 1   Ascorbic Acid  (VITAMIN C ) 1000 MG tablet Take 1,000 mg by mouth daily.     Blood Glucose Monitoring Suppl (ONETOUCH VERIO) w/Device KIT Use to check blood sugar 3 times a day and document results, bring to appointments.  Goal is <130 fasting blood sugar and <180 two hours after meals. 1 kit 0   calcium -vitamin D  (OSCAL WITH D) 500-5 MG-MCG tablet Take 1 tablet by mouth daily with breakfast.     Cranberry 500 MG CAPS Take 500 mg by mouth daily.     docusate (COLACE) 50 MG/5ML liquid Take 50 mg by mouth daily.     gabapentin  (NEURONTIN ) 600 MG tablet Take 1 tablet (600 mg total) by mouth at bedtime. 90 tablet 4   glucose blood test strip Use to check blood sugar 3 times a day and document results, bring to appointments.  Goal is <130 fasting blood sugar  and <180 two hours after meals. 100 each 12   HYDROcodone -acetaminophen  (NORCO/VICODIN) 5-325 MG tablet Take 1 tablet by mouth 2 (two) times daily. 60 tablet 0   Lancets (ONETOUCH ULTRASOFT) lancets Use to check blood sugar 3 times a day and document results, bring to appointments.  Goal is <130 fasting blood sugar and <180 two hours after meals. 100 each 12   LORazepam  (ATIVAN ) 1 MG tablet TAKE 1 TABLET BY MOUTH EVERYDAY AT BEDTIME 90 tablet 0   Magnesium  400 MG TABS Take 400 mg by mouth daily.      MELATONIN PO Take 10 mg by mouth at bedtime.     midodrine  (PROAMATINE ) 5 MG tablet Take 10 mg in the morning, take 5 mg in the afternoon, and 5 mg in the evening (Patient not taking: Reported on 12/19/2023) 270 tablet 3   Multiple Vitamin (MULTIVITAMIN WITH MINERALS) TABS tablet Take 1 tablet by mouth daily. Centrum Silver     NON FORMULARY Pt uses a cpap nightly     nystatin  cream (MYCOSTATIN ) Apply 1 Application topically 2 (two) times daily. 30 g 4   Omega-3 Fatty Acids (FISH OIL) 1200 MG CAPS Take 1,200 mg by mouth daily.     oxymetazoline  (AFRIN) 0.05 % nasal spray Place  2 sprays into both nostrils at bedtime.     pantoprazole  (PROTONIX ) 20 MG tablet Take 1 tablet (20 mg total) by mouth daily. 90 tablet 2   potassium chloride  SA (KLOR-CON  M) 20 MEQ tablet Take 2 tablets (40 meq) with Torsemide  30 tablet 3   QUEtiapine  Fumarate (SEROQUEL  XR) 150 MG 24 hr tablet Take 1 tablet (150 mg total) by mouth at bedtime. 90 tablet 4   rOPINIRole  (REQUIP ) 0.25 MG tablet Take 0.25 mg by mouth 4 (four) times daily.     rosuvastatin  (CRESTOR ) 40 MG tablet Take 1 tablet (40 mg total) by mouth daily. 90 tablet 3   solifenacin  (VESICARE ) 10 MG tablet Take 1 tablet (10 mg total) by mouth daily. 90 tablet 2   tiZANidine  (ZANAFLEX ) 4 MG tablet Take 1 tablet (4 mg total) by mouth every 6 (six) hours as needed for muscle spasms. 90 tablet 4   torsemide  (DEMADEX ) 20 MG tablet TAKE 2 TABLETS (40 MG TOTAL) BY MOUTH DAILY  AS NEEDED (FLUID RETENTION). 90 tablet 0   venlafaxine  XR (EFFEXOR -XR) 75 MG 24 hr capsule Take 3 capsules (225 mg total) by mouth daily. 270 capsule 3   No facility-administered medications prior to visit.    Review of Systems CNS: No confusion or sedation Cardiac: No angina or palpitations GI: No abdominal pain or constipation Constitutional: No nausea vomiting fevers or chills  Objective:  BP 116/76 (BP Location: Right Arm, Patient Position: Standing)   Pulse 88   Temp (!) 97.2 F (36.2 C) (Temporal)   Resp 18   Ht 5' 10 (1.778 m)   Wt 289 lb (131.1 kg)   SpO2 97%   BMI 41.47 kg/m    BP Readings from Last 3 Encounters:  12/19/23 132/76  12/18/23 128/81  12/04/23 116/76     Wt Readings from Last 3 Encounters:  12/19/23 280 lb (127 kg)  12/18/23 281 lb (127.5 kg)  12/12/23 281 lb 1.6 oz (127.5 kg)     Physical Exam Pt is alert and oriented PERRL EOMI HEART IS RRR no murmur or rub LCTA no wheezing or rales MUSCULOSKELETAL reveals some paraspinous muscle tenderness but no overt trigger points.  He is walking with an antalgic gait consistent with baseline.  Labs  Lab Results  Component Value Date   HGBA1C 6.8 (H) 10/05/2023   HGBA1C 6.8 (H) 07/05/2023   HGBA1C 6.7 (H) 04/02/2023   Lab Results  Component Value Date   MICROALBUR 80 (H) 03/23/2021   LDLCALC 61 10/05/2023   CREATININE 1.15 10/05/2023    -------------------------------------------------------------------------------------------------------------------- Lab Results  Component Value Date   WBC 7.5 02/28/2023   HGB 14.7 02/28/2023   HCT 43.4 02/28/2023   PLT 175 02/28/2023   GLUCOSE 179 (H) 10/05/2023   CHOL 134 10/05/2023   TRIG 151 (H) 10/05/2023   HDL 47 10/05/2023   LDLCALC 61 10/05/2023   ALT 19 10/05/2023   AST 26 10/05/2023   NA 140 10/05/2023   K 4.6 10/05/2023   CL 100 10/05/2023   CREATININE 1.15 10/05/2023   BUN 17 10/05/2023   CO2 25 10/05/2023   TSH 1.250 04/02/2023    INR 1.2 02/16/2014   HGBA1C 6.8 (H) 10/05/2023   MICROALBUR 80 (H) 03/23/2021    --------------------------------------------------------------------------------------------------------------------- DG OR UROLOGY CYSTO IMAGE ([facility] ONLY) Result Date: 09/17/2023 There is no interpretation for this exam.  This order is for images obtained during a surgical procedure.  Please See Surgeries Tab for more information regarding the procedure.  Assessment & Plan:   Nicholas [name] was seen today for back pain.  Diagnoses and all orders for this visit:  Chronic pain syndrome -     ToxASSURE Select 13 (MW), Urine  Lumbar radiculopathy  Bilateral sciatica  Facet syndrome, lumbar  Bilateral hand pain  Chronic, continuous use of opioids  Osteoarthritis of right hip, unspecified osteoarthritis type  Other orders -     HYDROcodone -acetaminophen  (NORCO/VICODIN) 5-325 MG tablet; Take 1 tablet by mouth 2 (two) times daily. -     HYDROcodone -acetaminophen  (NORCO/VICODIN) 5-325 MG tablet; Take 1 tablet by mouth 2 (two) times daily.        ----------------------------------------------------------------------------------------------------------------------  Problem List Items Addressed This Visit       Unprioritized   Chronic pain syndrome - Primary (Chronic)   Relevant Medications   HYDROcodone -acetaminophen  (NORCO/VICODIN) 5-325 MG tablet (Start on 12/28/2023)   HYDROcodone -acetaminophen  (NORCO/VICODIN) 5-325 MG tablet (Start on 01/27/2024)   Other Relevant Orders   ToxASSURE Select 13 (MW), Urine (Completed)   Chronic, continuous use of opioids   Facet syndrome, lumbar   Relevant Medications   HYDROcodone -acetaminophen  (NORCO/VICODIN) 5-325 MG tablet (Start on 12/28/2023)   HYDROcodone -acetaminophen  (NORCO/VICODIN) 5-325 MG tablet (Start on 01/27/2024)   Lumbar radiculopathy   Other Visit Diagnoses       Bilateral sciatica         Bilateral hand pain         Osteoarthritis  of right hip, unspecified osteoarthritis type       Relevant Medications   HYDROcodone -acetaminophen  (NORCO/VICODIN) 5-325 MG tablet (Start on 12/28/2023)   HYDROcodone -acetaminophen  (NORCO/VICODIN) 5-325 MG tablet (Start on 01/27/2024)         ----------------------------------------------------------------------------------------------------------------------  1. Chronic pain syndrome (Primary) Will continue with her current medication management.  I have reviewed the [place]  practitioner database information is appropriate.  Refills were generated for December 5 in January for.  Will schedule him for return to clinic in 2 months. - ToxASSURE Select 13 (MW), Urine  2. Lumbar radiculopathy As above and continue current management  3. Bilateral sciatica As above  4. Facet syndrome, lumbar   5. Bilateral hand pain   6. Chronic, continuous use of opioids   7. Osteoarthritis of right hip, unspecified osteoarthritis type     ----------------------------------------------------------------------------------------------------------------------  I am having Nicholas [name] Nicholas [name] start on HYDROcodone -acetaminophen  and HYDROcodone -acetaminophen . I am also having him maintain his oxymetazoline , Fish Oil, Cranberry, Magnesium , acetaminophen , rOPINIRole , vitamin C , multivitamin with minerals, MELATONIN PO, tiZANidine , OneTouch Verio, onetouch ultrasoft, glucose blood, calcium -vitamin D , potassium chloride  SA, torsemide , nystatin  cream, NON FORMULARY, QUEtiapine  Fumarate, rosuvastatin , venlafaxine  XR, midodrine , apixaban , pantoprazole , solifenacin , docusate, gabapentin , LORazepam , HYDROcodone -acetaminophen , and albuterol .   Meds ordered this encounter  Medications   HYDROcodone -acetaminophen  (NORCO/VICODIN) 5-325 MG tablet    Sig: Take 1 tablet by mouth 2 (two) times daily.    Dispense:  60 tablet    Refill:  0   HYDROcodone -acetaminophen  (NORCO/VICODIN) 5-325 MG tablet     Sig: Take 1 tablet by mouth 2 (two) times daily.    Dispense:  60 tablet    Refill:  0   Patient's Medications  New Prescriptions   HYDROCODONE -ACETAMINOPHEN  (NORCO/VICODIN) 5-325 MG TABLET    Take 1 tablet by mouth 2 (two) times daily.   HYDROCODONE -ACETAMINOPHEN  (NORCO/VICODIN) 5-325 MG TABLET    Take 1 tablet by mouth 2 (two) times daily.   SULFAMETHOXAZOLE -TRIMETHOPRIM  (BACTRIM  DS) 800-160 MG TABLET    Take 1 tablet by mouth 2 (two) times daily for 7 days.  Previous Medications   ACETAMINOPHEN  (TYLENOL ) 650 MG CR TABLET    Take 650 mg by mouth every 8 (eight) hours as needed for pain.   ALBUTEROL  (VENTOLIN  HFA) 108 (90 BASE) MCG/ACT INHALER    Inhale 2 puffs into the lungs.   APIXABAN  (ELIQUIS ) 5 MG TABS TABLET    Take 1 tablet (5 mg total) by mouth 2 (two) times daily.   ASCORBIC ACID  (VITAMIN C ) 1000 MG TABLET    Take 1,000 mg by mouth daily.   BLOOD GLUCOSE MONITORING SUPPL (ONETOUCH VERIO) W/DEVICE KIT    Use to check blood sugar 3 times a day and document results, bring to appointments.  Goal is <130 fasting blood sugar and <180 two hours after meals.   CALCIUM -VITAMIN D  (OSCAL WITH D) 500-5 MG-MCG TABLET    Take 1 tablet by mouth daily with breakfast.   CRANBERRY 500 MG CAPS    Take 500 mg by mouth daily.   DOCUSATE (COLACE) 50 MG/5ML LIQUID    Take 50 mg by mouth daily.   GABAPENTIN  (NEURONTIN ) 600 MG TABLET    Take 1 tablet (600 mg total) by mouth at bedtime.   GLUCOSE BLOOD TEST STRIP    Use to check blood sugar 3 times a day and document results, bring to appointments.  Goal is <130 fasting blood sugar and <180 two hours after meals.   HYDROCODONE -ACETAMINOPHEN  (NORCO/VICODIN) 5-325 MG TABLET    Take 1 tablet by mouth 2 (two) times daily.   LANCETS (ONETOUCH ULTRASOFT) LANCETS    Use to check blood sugar 3 times a day and document results, bring to appointments.  Goal is <130 fasting blood sugar and <180 two hours after meals.   LORAZEPAM  (ATIVAN ) 1 MG TABLET    TAKE 1 TABLET BY  MOUTH EVERYDAY AT BEDTIME   MAGNESIUM  400 MG TABS    Take 400 mg by mouth daily.    MELATONIN PO    Take 10 mg by mouth at bedtime.   MIDODRINE  (PROAMATINE ) 5 MG TABLET    Take 10 mg in the morning, take 5 mg in the afternoon, and 5 mg in the evening   MULTIPLE VITAMIN (MULTIVITAMIN WITH MINERALS) TABS TABLET    Take 1 tablet by mouth daily. Centrum Silver   NON FORMULARY    Pt uses a cpap nightly   NYSTATIN  CREAM (MYCOSTATIN )    Apply 1 Application topically 2 (two) times daily.   OMEGA-3 FATTY ACIDS (FISH OIL) 1200 MG CAPS    Take 1,200 mg by mouth daily.   OXYMETAZOLINE  (AFRIN) 0.05 % NASAL SPRAY    Place 2 sprays into both nostrils at bedtime.   PANTOPRAZOLE  (PROTONIX ) 20 MG TABLET    Take 1 tablet (20 mg total) by mouth daily.   POTASSIUM CHLORIDE  SA (KLOR-CON  M) 20 MEQ TABLET    Take 2 tablets (40 meq) with Torsemide    QUETIAPINE  FUMARATE (SEROQUEL  XR) 150 MG 24 HR TABLET    Take 1 tablet (150 mg total) by mouth at bedtime.   ROPINIROLE  (REQUIP ) 0.25 MG TABLET    Take 0.25 mg by mouth 4 (four) times daily.   ROSUVASTATIN  (CRESTOR ) 40 MG TABLET    Take 1 tablet (40 mg total) by mouth daily.   SOLIFENACIN  (VESICARE ) 10 MG TABLET    Take 1 tablet (10 mg total) by mouth daily.   TIZANIDINE  (ZANAFLEX ) 4 MG TABLET    Take 1 tablet (4 mg total) by mouth every 6 (six) hours as needed for  muscle spasms.   TORSEMIDE  (DEMADEX ) 20 MG TABLET    TAKE 2 TABLETS (40 MG TOTAL) BY MOUTH DAILY AS NEEDED (FLUID RETENTION).   VENLAFAXINE  XR (EFFEXOR -XR) 75 MG 24 HR CAPSULE    Take 3 capsules (225 mg total) by mouth daily.  Modified Medications   No medications on file  Discontinued Medications   No medications on file   ----------------------------------------------------------------------------------------------------------------------  Follow-up: Return in about 2 months (around 02/03/2024) for evaluation, med refill.  Continue follow-up with primary care physicians for baseline medical care  Lynwood KANDICE Clause, MD

## 2023-12-26 ENCOUNTER — Other Ambulatory Visit: Payer: Self-pay

## 2023-12-26 ENCOUNTER — Encounter: Admitting: Emergency Medicine

## 2023-12-26 DIAGNOSIS — R0600 Dyspnea, unspecified: Secondary | ICD-10-CM

## 2023-12-26 DIAGNOSIS — R895 Abnormal microbiological findings in specimens from other organs, systems and tissues: Secondary | ICD-10-CM

## 2023-12-26 MED ORDER — AMOXICILLIN-POT CLAVULANATE 875-125 MG PO TABS: 1.0000 | ORAL_TABLET | Freq: Two times a day (BID) | ORAL | 0 refills | Status: AC

## 2023-12-26 NOTE — Progress Notes (Signed)
 Daily Session Note  Patient Details  Name: Nicholas [name] MRN: [id number] Date of Birth: 10/16/1945 Referring Provider:   Flowsheet Row Pulmonary Rehab from 12/12/2023 in [facility] Cardiac and Pulmonary Rehab  Referring Provider [name]    Encounter Date: 12/26/2023  Check In:  Session Check In - 12/26/23 1115       Check-In   Supervising physician immediately available to respond to emergencies See telemetry face sheet for immediately available ER MD    Location [facility]-Cardiac & Pulmonary Rehab    Staff Present Leita Franks RN,BSN;Joseph [facility] RCP,RRT,BSRT;Maxon Conetta BS, Exercise Physiologist;Denise Rhew PhD, RN,CNS,CEN    Virtual Visit No    Medication changes reported     No    Fall or balance concerns reported    No    Warm-up and Cool-down Performed on first and last piece of equipment    Resistance Training Performed Yes    VAD Patient? No    PAD/SET Patient? No      Pain Assessment   Currently in Pain? No/denies             Social History   Tobacco Use  Smoking Status Never   Passive exposure: Past  Smokeless Tobacco Never    Goals Met:  Proper associated with RPD/PD & O2 Sat Independence with exercise equipment Using PLB without cueing & demonstrates good technique Exercise tolerated well No report of concerns or symptoms today Strength training completed today  Goals Unmet:  Not Applicable  Comments: Pt able to follow exercise prescription today without complaint.  Will continue to monitor for progression.    Dr. Oneil Pinal is Medical Director for [facility] Cardiac Rehabilitation.  Dr. Fuad Aleskerov is Medical Director for [facility] Pulmonary Rehabilitation.

## 2023-12-31 ENCOUNTER — Encounter

## 2023-12-31 DIAGNOSIS — R0600 Dyspnea, unspecified: Secondary | ICD-10-CM | POA: Diagnosis not present

## 2023-12-31 NOTE — Progress Notes (Signed)
 Daily Session Note  Patient Details  Name: STEPHAN NELIS MRN: [id number] Date of Birth: April 06, 1945 Referring Provider:   Flowsheet Row Pulmonary Rehab from 12/12/2023 in [facility] Cardiac and Pulmonary Rehab  Referring Provider [name]    Encounter Date: 12/31/2023  Check In:  Session Check In - 12/31/23 1103       Check-In   Supervising physician immediately available to respond to emergencies See telemetry face sheet for immediately available ER MD    Location [facility]-Cardiac & Pulmonary Rehab    Staff Present Burnard Davenport RN,BSN,MPA;Joseph Rolinda RCP,RRT,BSRT;Laura Cates RN,BSN;Davian Wollenberg Dyane BS, ACSM CEP, Exercise Physiologist    Virtual Visit No    Medication changes reported     No    Fall or balance concerns reported    No    Warm-up and Cool-down Performed on first and last piece of equipment    Resistance Training Performed Yes    VAD Patient? No    PAD/SET Patient? No      Pain Assessment   Currently in Pain? No/denies             Social History   Tobacco Use  Smoking Status Never   Passive exposure: Past  Smokeless Tobacco Never    Goals Met:  Proper associated with RPD/PD & O2 Sat Independence with exercise equipment Using PLB without cueing & demonstrates good technique Exercise tolerated well No report of concerns or symptoms today Strength training completed today  Goals Unmet:  Not Applicable  Comments: Pt able to follow exercise prescription today without complaint.  Will continue to monitor for progression.    Dr. Oneil Pinal is Medical Director for [facility] Cardiac Rehabilitation.  Dr. Fuad Aleskerov is Medical Director for [facility] Pulmonary Rehabilitation.

## 2024-01-01 ENCOUNTER — Other Ambulatory Visit: Payer: Self-pay | Admitting: Nurse Practitioner

## 2024-01-01 ENCOUNTER — Other Ambulatory Visit: Payer: Self-pay | Admitting: Physician Assistant

## 2024-01-01 DIAGNOSIS — F419 Anxiety disorder, unspecified: Secondary | ICD-10-CM

## 2024-01-01 DIAGNOSIS — R895 Abnormal microbiological findings in specimens from other organs, systems and tissues: Secondary | ICD-10-CM

## 2024-01-02 ENCOUNTER — Encounter

## 2024-01-02 DIAGNOSIS — R0600 Dyspnea, unspecified: Secondary | ICD-10-CM

## 2024-01-02 NOTE — Progress Notes (Signed)
 Daily Session Note  Patient Details  Name: Nicholas [name] MRN: [id number] Date of Birth: Apr 16, 1945 Referring Provider:   Flowsheet Row Pulmonary Rehab from 12/12/2023 in [facility] Cardiac and Pulmonary Rehab  Referring Provider [name]    Encounter Date: 01/02/2024  Check In:  Session Check In - 01/02/24 1005       Check-In   Supervising physician immediately available to respond to emergencies See telemetry face sheet for immediately available ER MD    Location [facility]-Cardiac & Pulmonary Rehab    Staff Present Burnard Davenport RN,BSN,MPA;Joseph [facility] RCP,RRT,BSRT;Laura Cates RN,BSN;Margaret Best, MS, Exercise Physiologist    Virtual Visit No    Medication changes reported     No    Fall or balance concerns reported    No    Warm-up and Cool-down Performed on first and last piece of equipment    Resistance Training Performed Yes    VAD Patient? No    PAD/SET Patient? No      Pain Assessment   Currently in Pain? No/denies             Social History   Tobacco Use  Smoking Status Never   Passive exposure: Past  Smokeless Tobacco Never    Goals Met:  Proper associated with RPD/PD & O2 Sat Independence with exercise equipment Using PLB without cueing & demonstrates good technique Exercise tolerated well No report of concerns or symptoms today Strength training completed today  Goals Unmet:  Not Applicable  Comments: Pt able to follow exercise prescription today without complaint.  Will continue to monitor for progression.    Dr. Oneil Pinal is Medical Director for [facility] Cardiac Rehabilitation.  Dr. Fuad Aleskerov is Medical Director for [facility] Pulmonary Rehabilitation.

## 2024-01-03 NOTE — Telephone Encounter (Signed)
 Requested medications are due for refill today.  yes  Requested medications are on the active medications list.  yes  Last refill. 10/05/2023 #90 0 rf  Future visit scheduled.   yes  Notes to clinic.  Refill not delegated.    Requested Prescriptions  Pending Prescriptions Disp Refills   LORazepam  (ATIVAN ) 1 MG tablet [Pharmacy Med Name: LORAZEPAM  1 MG TABLET] 90 tablet 0    Sig: TAKE 1 TABLET BY MOUTH EVERYDAY AT BEDTIME     Not Delegated - Psychiatry: Anxiolytics/Hypnotics 2 Failed - 01/03/2024  4:54 PM      Failed - This refill cannot be delegated      Failed - Urine Drug Screen completed in last 360 days      Passed - Patient is not pregnant      Passed - Valid encounter within last 6 months    Recent Outpatient Visits           3 months ago Type 2 diabetes mellitus with obesity   [place] [facility] [place], [place] T, NP   6 months ago Type 2 diabetes mellitus with obesity   [place] [facility] [place], [place] T, NP   9 months ago Type 2 diabetes mellitus with obesity   [place] [facility] [place], Melanie DASEN, NP       Future Appointments             In 3 months Maurine Lukes, PA-C [facility]

## 2024-01-05 NOTE — Patient Instructions (Signed)

## 2024-01-07 ENCOUNTER — Encounter

## 2024-01-08 ENCOUNTER — Encounter: Payer: Self-pay | Admitting: Nurse Practitioner

## 2024-01-08 ENCOUNTER — Ambulatory Visit: Payer: Self-pay | Admitting: Nurse Practitioner

## 2024-01-08 ENCOUNTER — Ambulatory Visit: Admitting: Nurse Practitioner

## 2024-01-08 VITALS — BP 138/82 | HR 76 | Temp 99.7°F | Resp 17 | Ht 70.98 in | Wt 284.1 lb

## 2024-01-08 DIAGNOSIS — I2782 Chronic pulmonary embolism: Secondary | ICD-10-CM

## 2024-01-08 DIAGNOSIS — I4819 Other persistent atrial fibrillation: Secondary | ICD-10-CM | POA: Diagnosis not present

## 2024-01-08 DIAGNOSIS — I152 Hypertension secondary to endocrine disorders: Secondary | ICD-10-CM

## 2024-01-08 DIAGNOSIS — G4733 Obstructive sleep apnea (adult) (pediatric): Secondary | ICD-10-CM | POA: Diagnosis not present

## 2024-01-08 DIAGNOSIS — G894 Chronic pain syndrome: Secondary | ICD-10-CM

## 2024-01-08 DIAGNOSIS — E669 Obesity, unspecified: Secondary | ICD-10-CM

## 2024-01-08 DIAGNOSIS — E119 Type 2 diabetes mellitus without complications: Secondary | ICD-10-CM

## 2024-01-08 DIAGNOSIS — E1169 Type 2 diabetes mellitus with other specified complication: Secondary | ICD-10-CM

## 2024-01-08 DIAGNOSIS — F339 Major depressive disorder, recurrent, unspecified: Secondary | ICD-10-CM

## 2024-01-08 DIAGNOSIS — G2581 Restless legs syndrome: Secondary | ICD-10-CM

## 2024-01-08 DIAGNOSIS — S2232XA Fracture of one rib, left side, initial encounter for closed fracture: Secondary | ICD-10-CM

## 2024-01-08 DIAGNOSIS — Z23 Encounter for immunization: Secondary | ICD-10-CM

## 2024-01-08 DIAGNOSIS — E1159 Type 2 diabetes mellitus with other circulatory complications: Secondary | ICD-10-CM | POA: Diagnosis not present

## 2024-01-08 DIAGNOSIS — I495 Sick sinus syndrome: Secondary | ICD-10-CM

## 2024-01-08 DIAGNOSIS — F419 Anxiety disorder, unspecified: Secondary | ICD-10-CM | POA: Diagnosis not present

## 2024-01-08 LAB — BAYER DCA HB A1C WAIVED: HB A1C (BAYER DCA - WAIVED): 6.6 % — ABNORMAL HIGH (ref 4.8–5.6)

## 2024-01-08 NOTE — Addendum Note (Signed)
 Addended by: Rayshell Goecke T on: 01/08/2024 01:38 PM   Modules accepted: Level of Service

## 2024-01-08 NOTE — Assessment & Plan Note (Signed)
 Chronic, ongoing.  Continue current medication regimen and adjust as needed. Lipid panel today.

## 2024-01-08 NOTE — Assessment & Plan Note (Signed)
Ongoing and stable.  Have recommended 100% use. 

## 2024-01-08 NOTE — Addendum Note (Signed)
 Addended by: Terre Zabriskie T on: 01/08/2024 01:41 PM   Modules accepted: Level of Service

## 2024-01-08 NOTE — Assessment & Plan Note (Signed)
 On 01/03/24. Continue incentive spirometry at home. May use Lidocaine  patches as needed for pain. Alternate ice and heat. Ensure deep breathing exercises and support. If any fever or SOB immediately go to ER. We discussed with the possible chronic aspiration present may benefit swallow study in future.

## 2024-01-08 NOTE — Assessment & Plan Note (Signed)
 Chronic, stable. Continue to collaborate with cardiology and medication regimen as prescribed by them.

## 2024-01-08 NOTE — Assessment & Plan Note (Signed)
Chronic, ongoing.  Denies SI/HI.  Continue current medication regimen and adjust as needed.  Would benefit from trial off Effexor and trial of SSRI, but refuses this.   

## 2024-01-08 NOTE — Assessment & Plan Note (Addendum)
 Chronic, ongoing.  Discussed at length risk of benzo and opioid use in conjunction with each other.  Recommend he not take the two together at same hour during day or evening. Recommend since he has CPAP and improved sleep trial cut back slowly on Ativan .  They request 90 day supply, as previous PCP supplied this.  Are aware he does have to return every 3 months for refills.  UDS up to date with pain management.

## 2024-01-08 NOTE — Progress Notes (Addendum)
 BP 138/82 (BP Location: Left Arm, Patient Position: Sitting, Cuff Size: Normal)   Pulse 76   Temp 99.7 F (37.6 C) (Oral)   Resp 17   Ht 5' 10.98 (1.803 m)   Wt 284 lb 1.6 oz (128.9 kg)   SpO2 96%   BMI 39.64 kg/m    Subjective:    Patient ID: Nicholas [name], male    DOB: January 06, 1946, 78 y.o.   MRN: [id number]  HPI: Nicholas [name] is a 78 y.o. male  Chief Complaint  Patient presents with   Diabetes    No home checks on BS.    Anxiety    Was doing ok until his recent fall and since feeling a bit worse.    Hospitalization Follow-up    Didn't pick his feet up and fell. Left ribs took the worst of it. Wonders about using the Lidocaine  patch.    ER FOLLOW UP Was seen in ER on 01/03/24 at [facility]. Was going out door and did not lift feet up all the way, stubbed foot against threshold and fell.  CT did note possible nondisplaced fracture of left rib 7 laterally and right lower lobe opacities and mild bronchiectasis which could be secondary to chronic aspiration. Denies any coughing. Having baseline SOB. Was given incentive spirometer in ER.  He does endorse when eating sometimes food gets stuck.  Wrist imaging showed no fracture, but is having some swelling. Time since discharge: 5 days Hospital/facility: [facility] Diagnosis: Fall initial Procedures/tests: as above Consultants: none New medications: none Discharge instructions:  follow-up with PCP Status: stable   DIABETES September 6.8% A1c. Diet has been so so, has been doing heart track which is helping.  HISTORY: Ozempic  caused GI symptoms, including increased heart burn. Jardiance  caused urinary issues and frequent UTIs in past, so urology discontinued. Hypoglycemic episodes:no Polydipsia/polyuria: no Visual disturbance: no Chest pain: no Paresthesias: no Glucose Monitoring: yes  Accucheck frequency: not checking  Fasting glucose:   Post prandial:  Evening:  Before meals: Taking Insulin ?: no  Long acting insulin :  Short  acting insulin : Blood Pressure Monitoring: daily Retinal Examination: Up To Date -- [place], macular degeneration  Foot Exam: Up to Date Pneumovax: Up to Date Influenza: Up to Date Aspirin : no   HYPERTENSION / HYPERLIPIDEMIA/A-FIB Continues Rosuvastatin , Eliquis ,Torsemide  PRN, fish oil. Had ablation 05/17/20. Echo 04/06/22 EF 60-65% and mild LVH. Pacemaker placed January 2018 and lead extraction on 03/12/23. Uses CPAP every night. Saw Dr. Cindie on 12/19/23, was taken off Midodrine  due to elevations in BP. Previously took for orthostatic BP levels. Currently in heart care rehab.  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  Last visit 11/26/23 with no changes, did attend pulmonary rehab at one time with benefit. Satisfied with current treatment? ye Duration of hypertension: chronic BP monitoring frequency: not checking BP range:  BP medication side effects: no Duration of hyperlipidemia: chronic Cholesterol medication side effects: no Cholesterol supplements: fish oil Medication compliance: good compliance Aspirin : no Recent stressors: no Recurrent headaches: no Visual changes: no Palpitations: no Dyspnea: with more strenuous activity Chest pain: no Lower extremity edema: no Dizzy/lightheaded: no  CHRONIC KIDNEY DISEASE CKD status: stable Medications renally dose: yes Previous renal evaluation: no Pneumovax:  Up to Date Influenza Vaccine:  Up to Date    Latest Ref Rng & Units 10/05/2023    8:33 AM 07/05/2023    8:45 AM 04/02/2023   10:17 AM  BMP  Glucose 70 - 99 mg/dL  179  144  163   BUN 8 - 27 mg/dL 17  16  23    Creatinine 0.76 - 1.27 mg/dL 8.84  8.91  8.87   BUN/Creat Ratio 10 - 24 15  15  21    Sodium 134 - 144 mmol/L 140  138  138   Potassium 3.5 - 5.2 mmol/L 4.6  4.5  5.0   Chloride 96 - 106 mmol/L 100  101  101   CO2 20 - 29 mmol/L 25  23  25    Calcium  8.6 - 10.2 mg/dL 9.4  9.5  9.7     DEPRESSION & CHRONIC PAIN Long term use of Effexor ,  Seroquel , and Ativan .  Pt and his wife at bedside made aware of risks of benzo medication use to include increased sedation, respiratory suppression, falls, extrapyramidal movements, dependence and cardiovascular events.  Pt and his wife would like to continue treatment as benefit determined to outweigh risk.  Multiple at length discussions with him that he is also on opioid therapy.  Discussed risks with taking these three medications together at same time and recommend to separate them when taking Seroquel , Ativan , and opioid.      History. In past taking 1/2 tablet Ativan  did not work well (per wife and patient) and wishes to maintain current dosing.  Ativan  fill on PDMP review 01/04/24 and last opioid fill 01/02/24.  UDS with pain management on 12/04/23. Dr. Myra with pain management last seen on 12/04/23. Veteran, has been on this regimen for years. Duration: stable Anxious mood: little bit more since the fall Excessive worrying: a little bit Irritability: no Sweating: no Nausea: no Palpitations:no Hyperventilation: no Panic attacks: no Agoraphobia: no  Obscessions/compulsions: no Depressed mood: sometimes    01/08/2024    8:28 AM 12/24/2023   11:30 AM 12/12/2023   12:02 PM 12/04/2023    8:20 AM 11/02/2023    1:21 PM  Depression screen PHQ 2/9  Decreased Interest 0 0 0 0 0  Down, Depressed, Hopeless 0 0 0 0 0  PHQ - 2 Score 0 0 0 0 0  Altered sleeping 1 0 1    Tired, decreased energy 1 2 3     Change in appetite 0 2 0    Feeling bad or failure about yourself  0 0 0    Trouble concentrating 0 0 0    Moving slowly or fidgety/restless 0 0 2    Suicidal thoughts 0 0 0    PHQ-9 Score 2 4 6     Difficult doing work/chores Not difficult at all Not difficult at all Somewhat difficult         01/08/2024    8:29 AM 10/05/2023    8:55 AM 05/09/2023    2:45 PM 10/23/2022    9:11 AM  GAD 7 : Generalized Anxiety Score  Nervous, Anxious, on Edge 1 0  1  Control/stop worrying 1 2 0 2   Worry too much - different things 1 0 0 2  Trouble relaxing 1 0 1 1  Restless 0 0 0 0  Easily annoyed or irritable 0 0 0 1  Afraid - awful might happen 0 1 0 2  Total GAD 7 Score 4 3  9   Anxiety Difficulty  Not difficult at all Not difficult at all Somewhat difficult   AIMS:  Facial and Oral Movements  Muscles of Facial Expression: None Lips and Perioral Area: None Jaw: None Tongue: None Extremity Movements: Upper (arms, wrists, hands, fingers): None Lower (  legs, knees, ankles, toes): None Trunk Movements:  Neck, shoulders, hips: None Overall Severity : Severity of abnormal movements: None Incapacitation due to abnormal movements: None Patient's awareness of abnormal movements (rate only patient's report): none present Dental Status  Current problems with teeth and/or dentures?: No  Does patient usually wear dentures?: No   Relevant past medical, surgical, family and social history reviewed and updated as indicated. Interim medical history since our last visit reviewed. Allergies and medications reviewed and updated.  Review of Systems  Constitutional:  Negative for activity change, appetite change, diaphoresis, fatigue and fever.  Respiratory:  Positive for shortness of breath (occasional). Negative for cough, chest tightness and wheezing.   Cardiovascular:  Negative for chest pain, palpitations and leg swelling.  Endocrine: Negative for polydipsia, polyphagia and polyuria.  Musculoskeletal:  Positive for arthralgias (to ribs left side).  Neurological: Negative.   Psychiatric/Behavioral:  Negative for decreased concentration, self-injury, sleep disturbance and suicidal ideas. The patient is not nervous/anxious.    Per HPI unless specifically indicated above     Objective:    BP 138/82 (BP Location: Left Arm, Patient Position: Sitting, Cuff Size: Normal)   Pulse 76   Temp 99.7 F (37.6 C) (Oral)   Resp 17   Ht 5' 10.98 (1.803 m)   Wt 284 lb 1.6 oz (128.9 kg)    SpO2 96%   BMI 39.64 kg/m   Wt Readings from Last 3 Encounters:  01/08/24 284 lb 1.6 oz (128.9 kg)  12/19/23 280 lb (127 kg)  12/18/23 281 lb (127.5 kg)    Physical Exam Vitals and nursing note reviewed.  Constitutional:      General: He is awake. He is not in acute distress.    Appearance: Normal appearance. He is well-developed and well-groomed. He is obese. He is not ill-appearing or toxic-appearing.  HENT:     Head: Normocephalic.     Right Ear: Hearing and external ear normal.     Left Ear: Hearing and external ear normal.  Eyes:     General: Lids are normal.     Extraocular Movements: Extraocular movements intact.     Conjunctiva/sclera: Conjunctivae normal.  Neck:     Thyroid : No thyromegaly.     Vascular: No carotid bruit.  Cardiovascular:     Rate and Rhythm: Normal rate and regular rhythm.     Heart sounds: Normal heart sounds. No murmur heard.    No gallop.  Pulmonary:     Effort: No accessory muscle usage or respiratory distress.     Breath sounds: Normal breath sounds. No decreased breath sounds, wheezing or rales.  Abdominal:     General: Bowel sounds are normal. There is no distension.     Palpations: Abdomen is soft.     Tenderness: There is no abdominal tenderness.  Musculoskeletal:     Cervical back: Full passive range of motion without pain.     Right lower leg: No edema.     Left lower leg: No edema.  Lymphadenopathy:     Cervical: No cervical adenopathy.  Skin:    General: Skin is warm.     Capillary Refill: Capillary refill takes less than 2 seconds.  Neurological:     Mental Status: He is alert and oriented to person, place, and time.     Deep Tendon Reflexes: Reflexes are normal and symmetric.     Reflex Scores:      Brachioradialis reflexes are 2+ on the right side and 2+ on the left  side.      Patellar reflexes are 2+ on the right side and 2+ on the left side. Psychiatric:        Attention and Perception: Attention normal.        Mood and  Affect: Mood normal.        Speech: Speech normal.        Behavior: Behavior normal. Behavior is cooperative.        Thought Content: Thought content normal.    Results for orders placed or performed in visit on 01/08/24  Bayer DCA Hb A1c Waived   Collection Time: 01/08/24  9:02 AM  Result Value Ref Range   HB A1C (BAYER DCA - WAIVED) 6.6 (H) 4.8 - 5.6 %   *Note: Due to a large number of results and/or encounters for the requested time period, some results have not been displayed. A complete set of results can be found in Results Review.      Assessment & Plan:   Problem List Items Addressed This Visit       Cardiovascular and Mediastinum   Sinus node dysfunction (HCC)   Chronic, stable. Continue to collaborate with cardiology and medication regimen as prescribed by them.      Persistent atrial fibrillation (HCC)   Chronic, ongoing.  Followed by cardiology.  Will continue this collaboration and medication as ordered by them.      Hypertension associated with diabetes (HCC)   Chronic, stable.  BP slightly above goal today, but trending down.  Followed by cardiology.  With orthostatic BP presenting occasionally in past, currently off Midodrine .  Continue current regimen at this time and have recommended utilizing compression hose at home. May need to adjust or restart some HTN medications in future.  Continue collaboration with cardiology + neurology and current medication regimen.  Labs: CMP.  Recommend ensuring good fluid intake at home.        Relevant Orders   Bayer DCA Hb A1c Waived (Completed)   Chronic pulmonary thromboembolism syndrome (HCC)   Chronic, ongoing. Continue to collaborate with pulmonary and cardiology. Recent notes reviewed and will continue medications as ordered by them.        Respiratory   OSA (obstructive sleep apnea)   Ongoing and stable.  Have recommended 100% use.        Endocrine   Type 2 diabetes mellitus in patient with obesity (HCC) -  Primary   Diagnosed on 02/21/21, currently no medications. Jardiance  caused frequent UTI and Ozempic  GI issues.  A1c 6.6% today and urine ALB 35 March 2025.  Glucometer supplies present, recommend he check sugars at least daily fasting.  Labs today.  Recommend cutting back on snacking during day and minimize sweets.  Return in 3 months. - Statin on board.  No ACE or ARB due to hypotension episodes - may be able to add back on in future. - Vaccinations up to date. - Eye and foot exams up to date.      Relevant Orders   Bayer DCA Hb A1c Waived (Completed)   Hyperlipidemia associated with type 2 diabetes mellitus (HCC)   Chronic, ongoing.  Continue current medication regimen and adjust as needed. Lipid panel today.      Relevant Orders   Bayer DCA Hb A1c Waived (Completed)   Comprehensive metabolic panel with GFR   Lipid Panel w/o Chol/HDL Ratio     Musculoskeletal and Integument   Closed fracture of one rib of left side   On 01/03/24. Continue incentive  spirometry at home. May use Lidocaine  patches as needed for pain. Alternate ice and heat. Ensure deep breathing exercises and support. If any fever or SOB immediately go to ER. We discussed with the possible chronic aspiration present may benefit swallow study in future.        Other   Morbid obesity (HCC) (Chronic)   BMI 39.64 with HTN, A-Fib, Heart Block, and CKD.  Recommended eating smaller high protein, low fat meals more frequently and exercising 30 mins a day 5 times a week with a goal of 10-15lb weight loss in the next 3 months. Patient voiced their understanding and motivation to adhere to these recommendations.       Depression, recurrent (Chronic)   Chronic, ongoing.  Denies SI/HI.  Continue current medication regimen and adjust as needed.  Would benefit from trial off Effexor  and trial of SSRI, but refuses this.        Chronic pain syndrome (Chronic)   Chronic, ongoing followed by pain clinic, Dr. Myra.  Discussed at length  risk of benzo and opioid use in conjunction with each other.  Recommend he not take the two together at same hour during day or evening.        Anxiety (Chronic)   Chronic, ongoing.  Discussed at length risk of benzo and opioid use in conjunction with each other.  Recommend he not take the two together at same hour during day or evening. Recommend since he has CPAP and improved sleep trial cut back slowly on Ativan .  They request 90 day supply, as previous PCP supplied this.  Are aware he does have to return every 3 months for refills.  UDS up to date with pain management.          Follow up plan: Return in about 6 weeks (around 02/19/2024) for RIB FRACTURES -- possible swallow study needed.

## 2024-01-08 NOTE — Assessment & Plan Note (Signed)
 Diagnosed on 02/21/21, currently no medications. Jardiance  caused frequent UTI and Ozempic  GI issues.  A1c 6.6% today and urine ALB 35 March 2025.  Glucometer supplies present, recommend he check sugars at least daily fasting.  Labs today.  Recommend cutting back on snacking during day and minimize sweets.  Return in 3 months. - Statin on board.  No ACE or ARB due to hypotension episodes - may be able to add back on in future. - Vaccinations up to date. - Eye and foot exams up to date.

## 2024-01-08 NOTE — Assessment & Plan Note (Signed)
 Chronic, ongoing. Continue to collaborate with pulmonary and cardiology. Recent notes reviewed and will continue medications as ordered by them.

## 2024-01-08 NOTE — Assessment & Plan Note (Signed)
Chronic, ongoing followed by pain clinic, Dr. Andree Elk.  Discussed at length risk of benzo and opioid use in conjunction with each other.  Recommend he not take the two together at same hour during day or evening.

## 2024-01-08 NOTE — Assessment & Plan Note (Signed)
 Chronic, ongoing.  Followed by cardiology.  Will continue this collaboration and medication as ordered by them.

## 2024-01-08 NOTE — Assessment & Plan Note (Signed)
 BMI 39.64 with HTN, A-Fib, Heart Block, and CKD.  Recommended eating smaller high protein, low fat meals more frequently and exercising 30 mins a day 5 times a week with a goal of 10-15lb weight loss in the next 3 months. Patient voiced their understanding and motivation to adhere to these recommendations.

## 2024-01-08 NOTE — Progress Notes (Signed)
 A1c 6.6%, trend down a little. Great news!!

## 2024-01-08 NOTE — Assessment & Plan Note (Signed)
 Chronic, stable.  BP slightly above goal today, but trending down.  Followed by cardiology.  With orthostatic BP presenting occasionally in past, currently off Midodrine .  Continue current regimen at this time and have recommended utilizing compression hose at home. May need to adjust or restart some HTN medications in future.  Continue collaboration with cardiology + neurology and current medication regimen.  Labs: CMP.  Recommend ensuring good fluid intake at home.

## 2024-01-09 ENCOUNTER — Encounter

## 2024-01-09 DIAGNOSIS — R0600 Dyspnea, unspecified: Secondary | ICD-10-CM

## 2024-01-09 DIAGNOSIS — R0609 Other forms of dyspnea: Secondary | ICD-10-CM

## 2024-01-09 LAB — LIPID PANEL W/O CHOL/HDL RATIO
Cholesterol, Total: 126 mg/dL (ref 100–199)
HDL: 59 mg/dL (ref >39)
LDL Chol Calc (NIH): 51 mg/dL (ref 0–99)
Triglycerides: 79 mg/dL (ref 0–149)
VLDL Cholesterol Cal: 16 mg/dL (ref 5–40)

## 2024-01-09 LAB — COMPREHENSIVE METABOLIC PANEL WITH GFR
ALT: 27 IU/L (ref 0–44)
AST: 33 IU/L (ref 0–40)
Albumin: 4.4 g/dL (ref 3.8–4.8)
Alkaline Phosphatase: 58 IU/L (ref 47–123)
BUN/Creatinine Ratio: 16 (ref 10–24)
BUN: 16 mg/dL (ref 8–27)
Bilirubin Total: 0.7 mg/dL (ref 0.0–1.2)
CO2: 26 mmol/L (ref 20–29)
Calcium: 9.5 mg/dL (ref 8.6–10.2)
Chloride: 101 mmol/L (ref 96–106)
Creatinine, Ser: 1.01 mg/dL (ref 0.76–1.27)
Globulin, Total: 2.4 g/dL (ref 1.5–4.5)
Glucose: 144 mg/dL — ABNORMAL HIGH (ref 70–99)
Potassium: 4.6 mmol/L (ref 3.5–5.2)
Sodium: 143 mmol/L (ref 134–144)
Total Protein: 6.8 g/dL (ref 6.0–8.5)
eGFR: 76 mL/min/1.73 (ref >59)

## 2024-01-09 NOTE — Progress Notes (Signed)
 Pulmonary Individual Treatment Plan  Patient Details  Name: Nicholas [name] MRN: [id number] Date of Birth: 10-02-1945 Referring Provider:   Flowsheet Row Pulmonary Rehab from 12/12/2023 in [facility] Cardiac and Pulmonary Rehab  Referring Provider [name]    Initial Encounter Date:  Flowsheet Row Pulmonary Rehab from 12/12/2023 in [facility] Cardiac and Pulmonary Rehab  Date 12/12/23    Visit Diagnosis: Dyspnea, unspecified type  DOE (dyspnea on exertion)  Patient's Home Medications on Admission: Current Medications[1]  Past Medical History: Past Medical History:  Diagnosis Date   Abdominal pain, chronic, right lower quadrant 12/13/2020   Anterior urethral stricture    Anxiety    Arthritis    a. knees, hips, hands;  b. 11/2013 s/p L TKA @ [facility].   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 Dr Fernande   Depression    Diabetes mellitus without complication (HCC)    DVT (deep venous thrombosis) (HCC)    Erosive esophagitis    GERD (gastroesophageal reflux disease)    Gross hematuria    Hyperlipemia    Hypertension    borderline   Internal hemorrhoids    Orthostatic hypotension    Phimosis    Presence of permanent cardiac pacemaker    Pulmonary embolism (HCC)    RLS (restless legs syndrome)    Sensory neuropathy    Sleep apnea     Tobacco Use: Tobacco Use History[2]  Labs: Review Flowsheet  More data exists      Latest Ref Rng & Units 01/01/2023 04/02/2023 07/05/2023 10/05/2023 01/08/2024  Labs for ITP Cardiac and Pulmonary Rehab  Cholestrol 100 - 199 mg/dL 868  874  871  865  873   LDL (calc) 0 - 99 mg/dL 63  55  58  61  51   HDL-C >39 mg/dL 50  51  52  47  59   Trlycerides 0 - 149 mg/dL 95  899  93  848  79   Hemoglobin A1c 4.8 - 5.6 % 6.3  6.7  6.8  6.8  6.6      Pulmonary Assessment Scores:  Pulmonary Assessment Scores     Row Name  12/12/23 1201         ADL UCSD   ADL Phase Entry     SOB Score total 33     Rest 0     Walk 3     Stairs 5     Bath 3     Dress 0     Shop 3       CAT Score   CAT Score 12       mMRC Score   mMRC Score 3        UCSD: Self-administered rating of dyspnea associated with activities of daily living (ADLs) 6-point scale (0 = not at all to 5 = maximal or unable to do because of breathlessness)  Scoring Scores range from 0 to 120.  Minimally important difference is 5 units  CAT: CAT can identify the health impairment of COPD patients and is better correlated with disease progression.  CAT has a scoring range of zero to 40. The CAT score is classified into four groups of low (less than 10), medium (10 - 20), high (21-30) and very high (31-40) based on the impact level of disease on health status. A CAT score  over 10 suggests significant symptoms.  A worsening CAT score could be explained by an exacerbation, poor medication adherence, poor inhaler technique, or progression of COPD or comorbid conditions.  CAT MCID is 2 points  mMRC: mMRC (Modified Medical Research Council) Dyspnea Scale is used to assess the degree of baseline functional disability in patients of respiratory disease due to dyspnea. No minimal important difference is established. A decrease in score of 1 point or greater is considered a positive change.   Pulmonary Function Assessment:  Pulmonary Function Assessment - 12/05/23 1411       Breath   Shortness of Breath Yes;Limiting activity          Exercise Target Goals: Exercise Program Goal: Individual exercise prescription set using results from initial 6 min walk test and THRR while considering  patients activity barriers and safety.   Exercise Prescription Goal: Initial exercise prescription builds to 30-45 minutes a day of aerobic activity, 2-3 days per week.  Home exercise guidelines will be given to patient during program as part of exercise  prescription that the participant will acknowledge.  Education: Aerobic Exercise: - Group verbal and visual presentation on the components of exercise prescription. Introduces F.I.T.T principle from ACSM for exercise prescriptions.  Reviews F.I.T.T. principles of aerobic exercise including progression. Written material provided at class time.   Education: Resistance Exercise: - Group verbal and visual presentation on the components of exercise prescription. Introduces F.I.T.T principle from ACSM for exercise prescriptions  Reviews F.I.T.T. principles of resistance exercise including progression. Written material provided at class time.    Education: Exercise & Equipment Safety: - Individual verbal instruction and demonstration of equipment use and safety with use of the equipment. Flowsheet Row Pulmonary Rehab from 01/02/2024 in [facility] Cardiac and Pulmonary Rehab  Date 12/12/23  Educator [facility]  Instruction Review Code 1- Verbalizes Understanding    Education: Exercise Physiology & General Exercise Guidelines: - Group verbal and written instruction with models to review the exercise physiology of the cardiovascular system and associated critical values. Provides general exercise guidelines with specific guidelines to those with heart or lung disease.  Flowsheet Row Pulmonary Rehab from 01/02/2024 in [facility] Cardiac and Pulmonary Rehab  Date 01/02/24  Educator nt  Instruction Review Code 1- Tefl Teacher Understanding    Education: Flexibility, Balance, Mind/Body Relaxation: - Group verbal and visual presentation with interactive activity on the components of exercise prescription. Introduces F.I.T.T principle from ACSM for exercise prescriptions. Reviews F.I.T.T. principles of flexibility and balance exercise training including progression. Also discusses the mind body connection.  Reviews various relaxation techniques to help reduce and manage stress (i.e. Deep breathing, progressive muscle  relaxation, and visualization). Balance handout provided to take home. Written material provided at class time.   Activity Barriers & Risk Stratification:  Activity Barriers & Cardiac Risk Stratification - 12/12/23 1141       Activity Barriers & Cardiac Risk Stratification   Activity Barriers Right Hip Replacement;Left Knee Replacement;Left Hip Replacement;Right Knee Replacement          6 Minute Walk:  6 Minute Walk     Row Name 12/12/23 1137         6 Minute Walk   Phase Initial     Distance 890 feet     Walk Time 6 minutes     # of Rest Breaks 0     MPH 1.69     METS 1.14     RPE 17     Perceived Dyspnea  3  VO2 Peak 3.99     Symptoms Yes (comment)     Comments SOB     Resting HR 90 bpm     Resting BP 128/82     Resting Oxygen Saturation  94 %     Exercise Oxygen Saturation  during 6 min walk 93 %     Max Ex. HR 107 bpm     Max Ex. BP 134/72     2 Minute Post BP 124/72       Interval HR   1 Minute HR 94     2 Minute HR 100     3 Minute HR 102     4 Minute HR 100     5 Minute HR 101     6 Minute HR 107     2 Minute Post HR 100     Interval Heart Rate? Yes       Interval Oxygen   Interval Oxygen? Yes     Baseline Oxygen Saturation % 94 %     1 Minute Oxygen Saturation % 94 %     1 Minute Liters of Oxygen 0 L     2 Minute Oxygen Saturation % 93 %     2 Minute Liters of Oxygen 0 L     3 Minute Oxygen Saturation % 93 %     3 Minute Liters of Oxygen 0 L     4 Minute Oxygen Saturation % 94 %     4 Minute Liters of Oxygen 0 L     5 Minute Oxygen Saturation % 96 %     5 Minute Liters of Oxygen 0 L     6 Minute Oxygen Saturation % 95 %     6 Minute Liters of Oxygen 0 L     2 Minute Post Oxygen Saturation % 95 %     2 Minute Post Liters of Oxygen 0 L       Oxygen Initial Assessment:  Oxygen Initial Assessment - 12/05/23 1411       Home Oxygen   Home Oxygen Device None    Sleep Oxygen Prescription CPAP    Liters per minute 0    Home Exercise  Oxygen Prescription None    Home Resting Oxygen Prescription None    Compliance with Home Oxygen Use Yes      Initial 6 min Walk   Oxygen Used None      Program Oxygen Prescription   Program Oxygen Prescription None      Intervention   Short Term Goals To learn and exhibit compliance with exercise, home and travel O2 prescription;To learn and understand importance of maintaining oxygen saturations>88%;To learn and demonstrate proper use of respiratory medications;To learn and demonstrate proper pursed lip breathing techniques or other breathing techniques. ;To learn and understand importance of monitoring SPO2 with pulse oximeter and demonstrate accurate use of the pulse oximeter.    Long  Term Goals Verbalizes importance of monitoring SPO2 with pulse oximeter and return demonstration;Exhibits proper breathing techniques, such as pursed lip breathing or other method taught during program session;Demonstrates proper use of MDIs;Exhibits compliance with exercise, home  and travel O2 prescription;Maintenance of O2 saturations>88%;Compliance with respiratory medication          Oxygen Re-Evaluation:  Oxygen Re-Evaluation     Row Name 12/17/23 1122             Program Oxygen Prescription   Program Oxygen Prescription None  Home Oxygen   Home Oxygen Device None       Sleep Oxygen Prescription CPAP       Liters per minute 0       Home Exercise Oxygen Prescription None       Home Resting Oxygen Prescription None       Compliance with Home Oxygen Use Yes         Goals/Expected Outcomes   Short Term Goals To learn and exhibit compliance with exercise, home and travel O2 prescription;To learn and understand importance of maintaining oxygen saturations>88%;To learn and demonstrate proper use of respiratory medications;To learn and demonstrate proper pursed lip breathing techniques or other breathing techniques. ;To learn and understand importance of monitoring SPO2 with pulse  oximeter and demonstrate accurate use of the pulse oximeter.       Long  Term Goals Verbalizes importance of monitoring SPO2 with pulse oximeter and return demonstration;Exhibits proper breathing techniques, such as pursed lip breathing or other method taught during program session;Demonstrates proper use of MDIs;Exhibits compliance with exercise, home  and travel O2 prescription;Maintenance of O2 saturations>88%;Compliance with respiratory medication       Comments Reviewed PLB technique with pt.  Talked about how it works and it's importance in maintaining their exercise saturations.       Goals/Expected Outcomes Short: Become more profiecient at using PLB. Long: Become independent at using PLB.          Oxygen Discharge (Final Oxygen Re-Evaluation):  Oxygen Re-Evaluation - 12/17/23 1122       Program Oxygen Prescription   Program Oxygen Prescription None      Home Oxygen   Home Oxygen Device None    Sleep Oxygen Prescription CPAP    Liters per minute 0    Home Exercise Oxygen Prescription None    Home Resting Oxygen Prescription None    Compliance with Home Oxygen Use Yes      Goals/Expected Outcomes   Short Term Goals To learn and exhibit compliance with exercise, home and travel O2 prescription;To learn and understand importance of maintaining oxygen saturations>88%;To learn and demonstrate proper use of respiratory medications;To learn and demonstrate proper pursed lip breathing techniques or other breathing techniques. ;To learn and understand importance of monitoring SPO2 with pulse oximeter and demonstrate accurate use of the pulse oximeter.    Long  Term Goals Verbalizes importance of monitoring SPO2 with pulse oximeter and return demonstration;Exhibits proper breathing techniques, such as pursed lip breathing or other method taught during program session;Demonstrates proper use of MDIs;Exhibits compliance with exercise, home  and travel O2 prescription;Maintenance of O2  saturations>88%;Compliance with respiratory medication    Comments Reviewed PLB technique with pt.  Talked about how it works and it's importance in maintaining their exercise saturations.    Goals/Expected Outcomes Short: Become more profiecient at using PLB. Long: Become independent at using PLB.          Initial Exercise Prescription:  Initial Exercise Prescription - 12/12/23 1100       Date of Initial Exercise RX and Referring Provider   Date 12/12/23    Referring Provider Aleskerov      Oxygen   Maintain Oxygen Saturation 88% or higher      NuStep   Level 2    SPM 80    Minutes 15    METs 1.14      REL-XR   Level 2    Speed 50    Minutes 15    METs 1.14  Biostep-RELP   Level 1    Minutes 15    METs 1.14      Track   Laps 10    Minutes 15    METs 1.54      Prescription Details   Frequency (times per week) 2    Duration Progress to 30 minutes of continuous aerobic without signs/symptoms of physical distress      Intensity   THRR 40-80% of Max Heartrate 110-131    Ratings of Perceived Exertion 11-13    Perceived Dyspnea 0-4      Resistance Training   Training Prescription Yes    Weight 5    Reps 10-15          Perform Capillary Blood Glucose checks as needed.  Exercise Prescription Changes:   Exercise Prescription Changes     Row Name 12/12/23 1100 01/02/24 1100           Response to Exercise   Blood Pressure (Admit) 128/82 122/60      Blood Pressure (Exercise) 134/72 148/70      Blood Pressure (Exit) 124/72 118/62      Heart Rate (Admit) 90 bpm 82 bpm      Heart Rate (Exercise) 107 bpm 109 bpm      Heart Rate (Exit) 95 bpm 99 bpm      Oxygen Saturation (Admit) 94 % 93 %      Oxygen Saturation (Exercise) 93 % 90 %      Oxygen Saturation (Exit) 95 % 91 %      Rating of Perceived Exertion (Exercise) 17 19      Perceived Dyspnea (Exercise) 3 3      Symptoms SOB none      Comments 6 MWT results 1st 2 weeks of exercise sessions       Duration -- Progress to 30 minutes of  aerobic without signs/symptoms of physical distress      Intensity -- THRR unchanged        Progression   Progression -- Continue to progress workloads to maintain intensity without signs/symptoms of physical distress.      Average METs -- 2.37        Resistance Training   Training Prescription -- Yes      Weight -- 5      Reps -- 10-15        Interval Training   Interval Training -- No        NuStep   Level -- 3  T4 and T6      Minutes -- 15      METs -- 3.6        REL-XR   Level -- 2      Minutes -- 15      METs -- 2.2        Track   Laps -- 19      Minutes -- 15      METs -- 2.03        Oxygen   Maintain Oxygen Saturation -- 88% or higher         Exercise Comments:   Exercise Comments     Row Name 12/17/23 1122           Exercise Comments First full day of exercise!  Patient was oriented to gym and equipment including functions, settings, policies, and procedures.  Patient's individual exercise prescription and treatment plan were reviewed.  All starting workloads were established based on the results of the 6 minute  walk test done at initial orientation visit.  The plan for exercise progression was also introduced and progression will be customized based on patient's performance and goals.          Exercise Goals and Review:   Exercise Goals     Row Name 12/12/23 1145             Exercise Goals   Increase Physical Activity Yes       Intervention Provide advice, education, support and counseling about physical activity/exercise needs.;Develop an individualized exercise prescription for aerobic and resistive training based on initial evaluation findings, risk stratification, comorbidities and participant's personal goals.       Expected Outcomes Short Term: Attend rehab on a regular basis to increase amount of physical activity.;Long Term: Add in home exercise to make exercise part of routine and to increase amount  of physical activity.;Long Term: Exercising regularly at least 3-5 days a week.       Increase Strength and Stamina Yes       Intervention Provide advice, education, support and counseling about physical activity/exercise needs.;Develop an individualized exercise prescription for aerobic and resistive training based on initial evaluation findings, risk stratification, comorbidities and participant's personal goals.       Expected Outcomes Short Term: Increase workloads from initial exercise prescription for resistance, speed, and METs.;Short Term: Perform resistance training exercises routinely during rehab and add in resistance training at home;Long Term: Improve cardiorespiratory fitness, muscular endurance and strength as measured by increased METs and functional capacity ( )       Able to understand and use rate of perceived exertion (RPE) scale Yes       Intervention Provide education and explanation on how to use RPE scale       Expected Outcomes Short Term: Able to use RPE daily in rehab to express subjective intensity level;Long Term:  Able to use RPE to guide intensity level when exercising independently       Able to understand and use Dyspnea scale Yes       Intervention Provide education and explanation on how to use Dyspnea scale       Expected Outcomes Short Term: Able to use Dyspnea scale daily in rehab to express subjective sense of shortness of breath during exertion;Long Term: Able to use Dyspnea scale to guide intensity level when exercising independently       Knowledge and understanding of Target Heart Rate Range (THRR) Yes       Intervention Provide education and explanation of THRR including how the numbers were predicted and where they are located for reference       Expected Outcomes Short Term: Able to state/look up THRR;Long Term: Able to use THRR to govern intensity when exercising independently;Short Term: Able to use daily as guideline for intensity in rehab       Able  to check pulse independently Yes       Intervention Provide education and demonstration on how to check pulse in carotid and radial arteries.;Review the importance of being able to check your own pulse for safety during independent exercise       Expected Outcomes Short Term: Able to explain why pulse checking is important during independent exercise;Long Term: Able to check pulse independently and accurately       Understanding of Exercise Prescription Yes       Intervention Provide education, explanation, and written materials on patient's individual exercise prescription       Expected Outcomes Short Term:  Able to explain program exercise prescription;Long Term: Able to explain home exercise prescription to exercise independently          Exercise Goals Re-Evaluation :  Exercise Goals Re-Evaluation     Row Name 12/17/23 1122 01/02/24 1140           Exercise Goal Re-Evaluation   Exercise Goals Review Increase Physical Activity;Able to understand and use rate of perceived exertion (RPE) scale;Knowledge and understanding of Target Heart Rate Range (THRR);Understanding of Exercise Prescription;Increase Strength and Stamina;Able to understand and use Dyspnea scale;Able to check pulse independently Increase Physical Activity;Understanding of Exercise Prescription;Increase Strength and Stamina      Comments Reviewed RPE and dyspnea scale, THR and program prescription with pt today.  Pt voiced understanding and was given a copy of goals to take home. Madeleine is off to a good start in the program and completed his first 2 weeks in this review period. He worked at level 3 on both the T4 and T6 nusteps. He also worked at level 2 on the XR. He was able to walk 19 laps on the track. We will continue to monitor his progress in the program.      Expected Outcomes Short: Use RPE daily to regulate intensity. Long: Follow program prescription in THR. Short: Continue to follow current exercise prescription. Long:  Continue exercise to improve strength and stamina.         Discharge Exercise Prescription (Final Exercise Prescription Changes):  Exercise Prescription Changes - 01/02/24 1100       Response to Exercise   Blood Pressure (Admit) 122/60    Blood Pressure (Exercise) 148/70    Blood Pressure (Exit) 118/62    Heart Rate (Admit) 82 bpm    Heart Rate (Exercise) 109 bpm    Heart Rate (Exit) 99 bpm    Oxygen Saturation (Admit) 93 %    Oxygen Saturation (Exercise) 90 %    Oxygen Saturation (Exit) 91 %    Rating of Perceived Exertion (Exercise) 19    Perceived Dyspnea (Exercise) 3    Symptoms none    Comments 1st 2 weeks of exercise sessions    Duration Progress to 30 minutes of  aerobic without signs/symptoms of physical distress    Intensity THRR unchanged      Progression   Progression Continue to progress workloads to maintain intensity without signs/symptoms of physical distress.    Average METs 2.37      Resistance Training   Training Prescription Yes    Weight 5    Reps 10-15      Interval Training   Interval Training No      NuStep   Level 3   T4 and T6   Minutes 15    METs 3.6      REL-XR   Level 2    Minutes 15    METs 2.2      Track   Laps 19    Minutes 15    METs 2.03      Oxygen   Maintain Oxygen Saturation 88% or higher          Nutrition:  Target Goals: Understanding of nutrition guidelines, daily intake of sodium 1500mg , cholesterol 200mg , calories 30% from fat and 7% or less from saturated fats, daily to have 5 or more servings of fruits and vegetables.  Education: Nutrition 1 -Group instruction provided by verbal, written material, interactive activities, discussions, models, and posters to present general guidelines for heart healthy nutrition including macronutrients,  label reading, and promoting whole foods over processed counterparts. Education serves as pensions consultant of discussion of heart healthy eating for all. Written material provided at  class time.     Education: Nutrition 2 -Group instruction provided by verbal, written material, interactive activities, discussions, models, and posters to present general guidelines for heart healthy nutrition including sodium, cholesterol, and saturated fat. Providing guidance of habit forming to improve blood pressure, cholesterol, and body weight. Written material provided at class time.     Biometrics:  Pre Biometrics - 12/12/23 1145       Pre Biometrics   Height 5' 11 (1.803 m)    Weight 281 lb 1.6 oz (127.5 kg)    Waist Circumference 48.5 inches    Hip Circumference 54 inches    Waist to Hip Ratio 0.9 %    BMI (Calculated) 39.22    Single Leg Stand 2.2 seconds           Nutrition Therapy Plan and Nutrition Goals:  Nutrition Therapy & Goals - 12/12/23 1125       Nutrition Therapy   Diet Cardiac, Low Na    Protein (specify units) 75    Fiber 30 grams    Whole Grain Foods 3 servings    Saturated Fats 15 max. grams    Fruits and Vegetables 5 servings/day    Sodium 2 grams      Personal Nutrition Goals   Nutrition Goal Read labels and reduce sodium intake to below 2300mg . Ideally 1500mg  per day.    Personal Goal #2 Reduce saturated fat, less than 12g per day. Replace bad fats for more heart healthy fats.    Personal Goal #3 Eat 3 times per day, small frequent meals or nutrient dense snacks    Comments Spoke with tommy and his wife about heart health. Reviewed discussion from last RD visit ~1year ago. He is still working on drinking more water . He reports not eating as much food. Spoke with him about goal of eating protein and carb at each meal and trying to eat nutrient dense foods at each meal hour. Provided guideline limits of less than 12g saturated fat and less than 1500mg  sodium per day. Reviewed a dinner they like to make of baked chicken, canned green beans. Encouraged adding small carb like 3/4 cup rice or half baked potato as a complex carb.       Intervention Plan   Intervention Prescribe, educate and counsel regarding individualized specific dietary modifications aiming towards targeted core components such as weight, hypertension, lipid management, diabetes, heart failure and other comorbidities.;Nutrition handout(s) given to patient.    Expected Outcomes Short Term Goal: Understand basic principles of dietary content, such as calories, fat, sodium, cholesterol and nutrients.;Short Term Goal: A plan has been developed with personal nutrition goals set during dietitian appointment.;Long Term Goal: Adherence to prescribed nutrition plan.          Nutrition Assessments:  MEDIFICTS Score Key: >=70 Need to make dietary changes  40-70 Heart Healthy Diet <= 40 Therapeutic Level Cholesterol Diet  Flowsheet Row Pulmonary Rehab from 12/12/2023 in [facility] Cardiac and Pulmonary Rehab  Picture Your Plate Total Score on Admission 57   Picture Your Plate Scores: <59 Unhealthy dietary pattern with much room for improvement. 41-50 Dietary pattern unlikely to meet recommendations for good health and room for improvement. 51-60 More healthful dietary pattern, with some room for improvement.  >60 Healthy dietary pattern, although there may be some specific behaviors that could be improved.  Nutrition Goals Re-Evaluation:  Nutrition Goals Re-Evaluation     Row Name 12/24/23 1129             Goals   Current Weight 283 lb (128.4 kg)       Comment Patient was informed on why it is important to maintain a balanced diet when dealing with Respiratory issues. Explained that it takes a lot of energy to breath and when they are short of breath often they will need to have a good diet to help keep up with the calories they are expending for breathing.       Expected Outcome Short: Choose and plan snacks accordingly to patients caloric intake to improve breathing. Long: Maintain a diet independently that meets their caloric intake to aid in daily  shortness of breath.          Nutrition Goals Discharge (Final Nutrition Goals Re-Evaluation):  Nutrition Goals Re-Evaluation - 12/24/23 1129       Goals   Current Weight 283 lb (128.4 kg)    Comment Patient was informed on why it is important to maintain a balanced diet when dealing with Respiratory issues. Explained that it takes a lot of energy to breath and when they are short of breath often they will need to have a good diet to help keep up with the calories they are expending for breathing.    Expected Outcome Short: Choose and plan snacks accordingly to patients caloric intake to improve breathing. Long: Maintain a diet independently that meets their caloric intake to aid in daily shortness of breath.          Psychosocial: Target Goals: Acknowledge presence or absence of significant depression and/or stress, maximize coping skills, provide positive support system. Participant is able to verbalize types and ability to use techniques and skills needed for reducing stress and depression.   Education: Stress, Anxiety, and Depression - Group verbal and visual presentation to define topics covered.  Reviews how body is impacted by stress, anxiety, and depression.  Also discusses healthy ways to reduce stress and to treat/manage anxiety and depression.  Written material provided at class time. Flowsheet Row Pulmonary Rehab from 01/02/2024 in [facility] Pul[facility] Re[facility]  Date 12/26/23  Educator kb  Instruction Review Code 1- [employer] Understanding    Education: Sleep Hygiene -Provides group verbal and written instruction about how sleep can affect your health.  Define sleep hygiene, discuss sleep cycles and impact of sleep habits. Review good sleep hygiene tips.    Initial Review & Psychosocial Screening:  Initial Psych Review & Screening - 12/05/23 1414       Initial Review   Current issues with Current Psychotropic Meds;Current Sleep Concerns;Current Stress Concerns     Source of Stress Concerns Family      Family Dynamics   Good Support System? Yes    Comments Madeleine states his wife is a good support system for him. He has been lacking energy lately and wants to get back into the program. He takes some medication for his mood.      Barriers   Psychosocial barriers to participate in program The patient should benefit from training in stress management and relaxation.;There are no identifiable barriers or psychosocial needs.      Screening Interventions   Interventions Provide feedback about the scores to participant;To provide support and resources with identified psychosocial needs;Encouraged to exercise    Expected Outcomes Short Term goal: Utilizing psychosocial counselor, staff and physician to assist with identification  of specific Stressors or current issues interfering with healing process. Setting desired goal for each stressor or current issue identified.;Long Term Goal: Stressors or current issues are controlled or eliminated.;Short Term goal: Identification and review with participant of any Quality of Life or Depression concerns found by scoring the questionnaire.;Long Term goal: The participant improves quality of Life and PHQ9 Scores as seen by post scores and/or verbalization of changes          Quality of Life Scores:  Scores of 19 and below usually indicate a poorer quality of life in these areas.  A difference of  2-3 points is a clinically meaningful difference.  A difference of 2-3 points in the total score of the Quality of Life Index has been associated with significant improvement in overall quality of life, self-image, physical symptoms, and general health in studies assessing change in quality of life.  PHQ-9: Review Flowsheet  More data exists      01/08/2024 12/24/2023 12/12/2023 12/04/2023 11/02/2023  Depression screen PHQ 2/9  Decreased Interest 0 0 0 0 0  Down, Depressed, Hopeless 0 0 0 0 0  PHQ - 2 Score 0 0 0 0 0  Altered  sleeping 1 0 1 - -  Tired, decreased energy 1 2 3  - -  Change in appetite 0 2 0 - -  Feeling bad or failure about yourself  0 0 0 - -  Trouble concentrating 0 0 0 - -  Moving slowly or fidgety/restless 0 0 2 - -  Suicidal thoughts 0 0 0 - -  PHQ-9 Score 2 4 6  - -  Difficult doing work/chores Not difficult at all Not difficult at all Somewhat difficult - -   Interpretation of Total Score  Total Score Depression Severity:  1-4 = Minimal depression, 5-9 = Mild depression, 10-14 = Moderate depression, 15-19 = Moderately severe depression, 20-27 = Severe depression   Psychosocial Evaluation and Intervention:  Psychosocial Evaluation - 12/05/23 1416       Psychosocial Evaluation & Interventions   Interventions Relaxation education;Encouraged to exercise with the program and follow exercise prescription;Stress management education    Comments Madeleine states his wife is a good support system for him. He has been lacking energy lately and wants to get back into the program. He takes some medication for his mood.    Expected Outcomes Short: Start [facility] to help with mood. Long: Maintain a healthy mental state.    Continue Psychosocial Services  Follow up required by staff          Psychosocial Re-Evaluation:  Psychosocial Re-Evaluation     Row Name 12/24/23 1133             Psychosocial Re-Evaluation   Current issues with Current Stress Concerns       Comments Reviewed patient health questionnaire (PHQ-9) with patient for follow up. Previously, patients score indicate signs/symptoms of depression.  Reviewed to see if patient is improving symptom wise while in program.  Score improved and patient states that it is because he has been able to exercise more and gain stamina.       Expected Outcomes Short: Continue to attend [facility] regularly for regular exercise and social engagement. Long: Continue to improve symptoms and manage a positive mental state.       Interventions Encouraged  to attend Pulmonary Rehabilitation for the exercise       Continue Psychosocial Services  Follow up required by staff  Psychosocial Discharge (Final Psychosocial Re-Evaluation):  Psychosocial Re-Evaluation - 12/24/23 1133       Psychosocial Re-Evaluation   Current issues with Current Stress Concerns    Comments Reviewed patient health questionnaire (PHQ-9) with patient for follow up. Previously, patients score indicate signs/symptoms of depression.  Reviewed to see if patient is improving symptom wise while in program.  Score improved and patient states that it is because he has been able to exercise more and gain stamina.    Expected Outcomes Short: Continue to attend [facility] regularly for regular exercise and social engagement. Long: Continue to improve symptoms and manage a positive mental state.    Interventions Encouraged to attend Pulmonary Rehabilitation for the exercise    Continue Psychosocial Services  Follow up required by staff          Education: Education Goals: Education classes will be provided on a weekly basis, covering required topics. Participant will state understanding/return demonstration of topics presented.  Learning Barriers/Preferences:  Learning Barriers/Preferences - 12/05/23 1414       Learning Barriers/Preferences   Learning Barriers None    Learning Preferences None          General Pulmonary Education Topics:  Infection Prevention: - Provides verbal and written material to individual with discussion of infection control including proper hand washing and proper equipment cleaning during exercise session. Flowsheet Row Pulmonary Rehab from 01/02/2024 in [facility] Cardiac and Pulmonary Rehab  Date 12/12/23  Educator [facility]  Instruction Review Code 1- Verbalizes Understanding    Falls Prevention: - Provides verbal and written material to individual with discussion of falls prevention and safety. Flowsheet Row Pulmonary Rehab from 01/02/2024  in [facility] Cardiac and Pulmonary Rehab  Date 12/12/23  Educator [facility]  Instruction Review Code 1- Verbalizes Understanding    Chronic Lung Disease Review: - Group verbal instruction with posters, models, PowerPoint presentations and videos,  to review new updates, new respiratory medications, new advancements in procedures and treatments. Providing information on websites and 800 numbers for continued self-education. Includes information about supplement oxygen, available portable oxygen systems, continuous and intermittent flow rates, oxygen safety, concentrators, and Medicare reimbursement for oxygen. Explanation of Pulmonary Drugs, including class, frequency, complications, importance of spacers, rinsing mouth after steroid MDI's, and proper cleaning methods for nebulizers. Review of basic lung anatomy and physiology related to function, structure, and complications of lung disease. Review of risk factors. Discussion about methods for diagnosing sleep apnea and types of masks and machines for OSA. Includes a review of the use of types of environmental controls: home humidity, furnaces, filters, dust mite/pet prevention, HEPA vacuums. Discussion about weather changes, air quality and the benefits of nasal washing. Instruction on Warning signs, infection symptoms, calling MD promptly, preventive modes, and value of vaccinations. Review of effective airway clearance, coughing and/or vibration techniques. Emphasizing that all should Create an Action Plan. Written material provided at class time. Flowsheet Row Pulmonary Rehab from 01/02/2024 in [facility] Cardiac and Pulmonary Rehab  Education need identified 12/12/23    AED/CPR: - Group verbal and written instruction with the use of models to demonstrate the basic use of the AED with the basic ABC's of resuscitation.    Tests and Procedures:  - Group verbal and visual presentation and models provide information about basic cardiac anatomy and function.  Reviews the testing methods done to diagnose heart disease and the outcomes of the test results. Describes the treatment choices: Medical Management, Angioplasty, or Coronary Bypass Surgery for treating various heart conditions including Myocardial Infarction,  Angina, Valve Disease, and Cardiac Arrhythmias.  Written material provided at class time.   Medication Safety: - Group verbal and visual instruction to review commonly prescribed medications for heart and lung disease. Reviews the medication, class of the drug, and side effects. Includes the steps to properly store meds and maintain the prescription regimen.  Written material given at graduation.   Other: -Provides group and verbal instruction on various topics (see comments)   Knowledge Questionnaire Score:  Knowledge Questionnaire Score - 12/12/23 1200       Knowledge Questionnaire Score   Pre Score 14/18           Core Components/Risk Factors/Patient Goals at Admission:  Personal Goals and Risk Factors at Admission - 12/12/23 1156       Core Components/Risk Factors/Patient Goals on Admission    Weight Management Yes;Weight Loss    Intervention Weight Management: Develop a combined nutrition and exercise program designed to reach desired caloric intake, while maintaining appropriate intake of nutrient and fiber, sodium and fats, and appropriate energy expenditure required for the weight goal.;Weight Management: Provide education and appropriate resources to help participant work on and attain dietary goals.;Weight Management/Obesity: Establish reasonable short term and long term weight goals.;Obesity: Provide education and appropriate resources to help participant work on and attain dietary goals.    Admit Weight 281 lb 1.6 oz (127.5 kg)    Goal Weight: Short Term 275 lb (124.7 kg)    Goal Weight: Long Term 270 lb (122.5 kg)    Expected Outcomes Short Term: Continue to assess and modify interventions until short term weight is  achieved;Long Term: Adherence to nutrition and physical activity/exercise program aimed toward attainment of established weight goal;Weight Loss: Understanding of general recommendations for a balanced deficit meal plan, which promotes 1-2 lb weight loss per week and includes a negative energy balance of [phone] kcal/d;Understanding recommendations for meals to include 15-35% energy as protein, 25-35% energy from fat, 35-60% energy from carbohydrates, less than 200mg  of dietary cholesterol, 20-35 gm of total fiber daily;Understanding of distribution of calorie intake throughout the day with the consumption of 4-5 meals/snacks    Improve shortness of breath with ADL's Yes    Intervention Provide education, individualized exercise plan and daily activity instruction to help decrease symptoms of SOB with activities of daily living.    Expected Outcomes Short Term: Improve cardiorespiratory fitness to achieve a reduction of symptoms when performing ADLs;Long Term: Be able to perform more ADLs without symptoms or delay the onset of symptoms    Diabetes Yes    Intervention Provide education about signs/symptoms and action to take for hypo/hyperglycemia.;Provide education about proper nutrition, including hydration, and aerobic/resistive exercise prescription along with prescribed medications to achieve blood glucose in normal ranges: Fasting glucose 65-99 mg/dL    Expected Outcomes Short Term: Participant verbalizes understanding of the signs/symptoms and immediate care of hyper/hypoglycemia, proper foot care and importance of medication, aerobic/resistive exercise and nutrition plan for blood glucose control.;Long Term: Attainment of HbA1C < 7%.    Hypertension Yes    Intervention Provide education on lifestyle modifcations including regular physical activity/exercise, weight management, moderate sodium restriction and increased consumption of fresh fruit, vegetables, and low fat dairy, alcohol moderation, and  smoking cessation.;Monitor prescription use compliance.    Expected Outcomes Short Term: Continued assessment and intervention until BP is < 140/30mm HG in hypertensive participants. < 130/24mm HG in hypertensive participants with diabetes, heart failure or chronic kidney disease.;Long Term: Maintenance of blood pressure at goal levels.  Lipids Yes    Intervention Provide education and support for participant on nutrition & aerobic/resistive exercise along with prescribed medications to achieve LDL 70mg , HDL >40mg .    Expected Outcomes Short Term: Participant states understanding of desired cholesterol values and is compliant with medications prescribed. Participant is following exercise prescription and nutrition guidelines.;Long Term: Cholesterol controlled with medications as prescribed, with individualized exercise RX and with personalized nutrition plan. Value goals: LDL < 70mg , HDL > 40 mg.          Education:Diabetes - Individual verbal and written instruction to review signs/symptoms of diabetes, desired ranges of glucose level fasting, after meals and with exercise. Acknowledge that pre and post exercise glucose checks will be done for 3 sessions at entry of program. Flowsheet Row Pulmonary Rehab from 12/07/2022 in [facility] Cardiac and Pulmonary Rehab  Date 10/18/22  Educator [facility]  Instruction Review Code 1- Verbalizes Understanding    Know Your Numbers and Heart Failure: - Group verbal and visual instruction to discuss disease risk factors for cardiac and pulmonary disease and treatment options.  Reviews associated critical values for Overweight/Obesity, Hypertension, Cholesterol, and Diabetes.  Discusses basics of heart failure: signs/symptoms and treatments.  Introduces Heart Failure Zone chart for action plan for heart failure. Written material provided at class time. Flowsheet Row Pulmonary Rehab from 01/02/2024 in [facility] Cardiac and Pulmonary Rehab  Date 12/19/23  Educator KB   Instruction Review Code 1- Verbalizes Understanding    Core Components/Risk Factors/Patient Goals Review:   Goals and Risk Factor Review     Row Name 12/24/23 1128             Core Components/Risk Factors/Patient Goals Review   Personal Goals Review Improve shortness of breath with ADL's       Review Spoke to patient about their shortness of breath and what they can do to improve. Patient has been informed of breathing techniques when starting the program. Patient is informed to tell staff if they have had any med changes and that certain meds they are taking or not taking can be causing shortness of breath.       Expected Outcomes Short: Attend [facility] regularly to improve shortness of breath with ADL's. Long: maintain independence with ADL's          Core Components/Risk Factors/Patient Goals at Discharge (Final Review):   Goals and Risk Factor Review - 12/24/23 1128       Core Components/Risk Factors/Patient Goals Review   Personal Goals Review Improve shortness of breath with ADL's    Review Spoke to patient about their shortness of breath and what they can do to improve. Patient has been informed of breathing techniques when starting the program. Patient is informed to tell staff if they have had any med changes and that certain meds they are taking or not taking can be causing shortness of breath.    Expected Outcomes Short: Attend [facility] regularly to improve shortness of breath with ADL's. Long: maintain independence with ADL's          ITP Comments:  ITP Comments     Row Name 12/05/23 1422 12/12/23 1137 12/17/23 1121 01/09/24 0858     ITP Comments Virtual Visit completed. Patient informed on EP and RD appointment and 6 Minute walk test. Patient also informed of patient health questionnaires on My Chart. Patient Verbalizes understanding. Visit diagnosis can be found in [facility] 11/27/2023. Completed and gym orientation for respiratory care services. Initial ITP  created and sent for review  to Dr. Halina Picking, Medical Director. First full day of exercise!  Patient was oriented to gym and equipment including functions, settings, policies, and procedures.  Patient's individual exercise prescription and treatment plan were reviewed.  All starting workloads were established based on the results of the 6 minute walk test done at initial orientation visit.  The plan for exercise progression was also introduced and progression will be customized based on patient's performance and goals. 30 Day review completed. Medical Director ITP review done, changes made as directed, and signed approval by Medical Director. New to program.       Comments: 30 day review      [1]  Current Outpatient Medications:    acetaminophen  (TYLENOL ) 650 MG CR tablet, Take 650 mg by mouth every 8 (eight) hours as needed for pain., Disp: , Rfl:    albuterol  (VENTOLIN  HFA) 108 (90 Base) MCG/ACT inhaler, Inhale 2 puffs into the lungs., Disp: , Rfl:    apixaban  (ELIQUIS ) 5 MG TABS tablet, Take 1 tablet (5 mg total) by mouth 2 (two) times daily., Disp: 180 tablet, Rfl: 1   Ascorbic Acid  (VITAMIN C ) 1000 MG tablet, Take 1,000 mg by mouth daily., Disp: , Rfl:    Blood Glucose Monitoring Suppl (ONETOUCH VERIO) w/Device KIT, Use to check blood sugar 3 times a day and document results, bring to appointments.  Goal is <130 fasting blood sugar and <180 two hours after meals., Disp: 1 kit, Rfl: 0   calcium -vitamin D  (OSCAL WITH D) 500-5 MG-MCG tablet, Take 1 tablet by mouth daily with breakfast., Disp: , Rfl:    Cranberry 500 MG CAPS, Take 500 mg by mouth daily., Disp: , Rfl:    docusate (COLACE) 50 MG/5ML liquid, Take 50 mg by mouth daily., Disp: , Rfl:    gabapentin  (NEURONTIN ) 600 MG tablet, Take 1 tablet (600 mg total) by mouth at bedtime., Disp: 90 tablet, Rfl: 4   glucose blood test strip, Use to check blood sugar 3 times a day and document results, bring to appointments.  Goal is <130 fasting  blood sugar and <180 two hours after meals., Disp: 100 each, Rfl: 12   HYDROcodone -acetaminophen  (NORCO/VICODIN) 5-325 MG tablet, Take 1 tablet by mouth 2 (two) times daily., Disp: 60 tablet, Rfl: 0   [START ON 01/27/2024] HYDROcodone -acetaminophen  (NORCO/VICODIN) 5-325 MG tablet, Take 1 tablet by mouth 2 (two) times daily., Disp: 60 tablet, Rfl: 0   Lancets (ONETOUCH ULTRASOFT) lancets, Use to check blood sugar 3 times a day and document results, bring to appointments.  Goal is <130 fasting blood sugar and <180 two hours after meals., Disp: 100 each, Rfl: 12   LORazepam  (ATIVAN ) 1 MG tablet, TAKE 1 TABLET BY MOUTH EVERYDAY AT BEDTIME, Disp: 90 tablet, Rfl: 0   Magnesium  400 MG TABS, Take 400 mg by mouth daily. , Disp: , Rfl:    MELATONIN PO, Take 10 mg by mouth at bedtime., Disp: , Rfl:    Multiple Vitamin (MULTIVITAMIN WITH MINERALS) TABS tablet, Take 1 tablet by mouth daily. Centrum Silver, Disp: , Rfl:    NON FORMULARY, Pt uses a cpap nightly, Disp: , Rfl:    nystatin  cream (MYCOSTATIN ), Apply 1 Application topically 2 (two) times daily., Disp: 30 g, Rfl: 4   Omega-3 Fatty Acids (FISH OIL) 1200 MG CAPS, Take 1,200 mg by mouth daily., Disp: , Rfl:    oxymetazoline  (AFRIN) 0.05 % nasal spray, Place 2 sprays into both nostrils at bedtime., Disp: , Rfl:    pantoprazole  (PROTONIX )  20 MG tablet, Take 1 tablet (20 mg total) by mouth daily., Disp: 90 tablet, Rfl: 2   potassium chloride  SA (KLOR-CON  M) 20 MEQ tablet, Take 2 tablets (40 meq) with Torsemide , Disp: 30 tablet, Rfl: 3   QUEtiapine  Fumarate (SEROQUEL  XR) 150 MG 24 hr tablet, Take 1 tablet (150 mg total) by mouth at bedtime., Disp: 90 tablet, Rfl: 4   rOPINIRole  (REQUIP ) 0.25 MG tablet, Take 0.25 mg by mouth 4 (four) times daily., Disp: , Rfl:    rosuvastatin  (CRESTOR ) 40 MG tablet, Take 1 tablet (40 mg total) by mouth daily., Disp: 90 tablet, Rfl: 3   solifenacin  (VESICARE ) 10 MG tablet, Take 1 tablet (10 mg total) by mouth daily., Disp: 90  tablet, Rfl: 2   tiZANidine  (ZANAFLEX ) 4 MG tablet, Take 1 tablet (4 mg total) by mouth every 6 (six) hours as needed for muscle spasms., Disp: 90 tablet, Rfl: 4   torsemide  (DEMADEX ) 20 MG tablet, TAKE 2 TABLETS (40 MG TOTAL) BY MOUTH DAILY AS NEEDED (FLUID RETENTION)., Disp: 90 tablet, Rfl: 0   venlafaxine  XR (EFFEXOR -XR) 75 MG 24 hr capsule, Take 3 capsules (225 mg total) by mouth daily., Disp: 270 capsule, Rfl: 3 [2]  Social History Tobacco Use  Smoking Status Never   Passive exposure: Past  Smokeless Tobacco Never

## 2024-01-09 NOTE — Progress Notes (Signed)
 Contacted via MyChart  Good morning Nicholas [name], your labs continue to be stable. Kidney function, creatinine and eGFR, remains normal, as is liver function, AST and ALT. LDL on lipid panel at goal. [name] job!!  No medication changes needed. Keep being amazing!!  Thank you for allowing me to participate in your care.  I appreciate you. Kindest regards, [name]

## 2024-01-14 ENCOUNTER — Encounter

## 2024-01-16 ENCOUNTER — Encounter

## 2024-01-21 ENCOUNTER — Encounter

## 2024-01-23 ENCOUNTER — Encounter

## 2024-01-28 ENCOUNTER — Encounter: Attending: Pulmonary Disease | Admitting: *Deleted

## 2024-01-28 DIAGNOSIS — R0600 Dyspnea, unspecified: Secondary | ICD-10-CM | POA: Insufficient documentation

## 2024-01-28 NOTE — Progress Notes (Signed)
 Incomplete Session Note  Patient Details  Name: Nicholas [name] MRN: [id number] Date of Birth: Apr 15, 1945 Referring Provider:   Flowsheet Row Pulmonary Rehab from 12/12/2023 in [facility] Cardiac and Pulmonary Rehab  Referring Provider [name] Nicholas [name] did not complete his rehab session.  Cracked a rib early December, attempted to attend program today. Sent home to talk with his doctor;as he is still hurting and unable to manage ADLs well. Is sleeping in recliner, unable to get into his bed

## 2024-01-30 ENCOUNTER — Encounter

## 2024-02-04 ENCOUNTER — Encounter

## 2024-02-06 ENCOUNTER — Encounter

## 2024-02-06 ENCOUNTER — Encounter: Payer: Self-pay | Admitting: *Deleted

## 2024-02-06 DIAGNOSIS — R0600 Dyspnea, unspecified: Secondary | ICD-10-CM

## 2024-02-06 NOTE — Progress Notes (Signed)
 Pulmonary Individual Treatment Plan  Patient Details  Name: Nicholas [name] MRN: [id number] Date of Birth: 11-Apr-1945 Referring Provider:   Flowsheet Row Pulmonary Rehab from 12/12/2023 in [facility] Cardiac and Pulmonary Rehab  Referring Provider [name]    Initial Encounter Date:  Flowsheet Row Pulmonary Rehab from 12/12/2023 in [facility] Cardiac and Pulmonary Rehab  Date 12/12/23    Visit Diagnosis: Dyspnea, unspecified type  Patient's Home Medications on Admission: Current Medications[1]  Past Medical History: Past Medical History:  Diagnosis Date   Abdominal pain, chronic, right lower quadrant 12/13/2020   Anterior urethral stricture    Anxiety    Arthritis    a. knees, hips, hands;  b. 11/2013 s/p L TKA @ [facility].   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 Dr Fernande   Depression    Diabetes mellitus without complication (HCC)    DVT (deep venous thrombosis) (HCC)    Erosive esophagitis    GERD (gastroesophageal reflux disease)    Gross hematuria    Hyperlipemia    Hypertension    borderline   Internal hemorrhoids    Orthostatic hypotension    Phimosis    Presence of permanent cardiac pacemaker    Pulmonary embolism (HCC)    RLS (restless legs syndrome)    Sensory neuropathy    Sleep apnea     Tobacco Use: Tobacco Use History[2]  Labs: Review Flowsheet  More data exists      Latest Ref Rng & Units 01/01/2023 04/02/2023 07/05/2023 10/05/2023 01/08/2024  Labs for ITP Cardiac and Pulmonary Rehab  Cholestrol 100 - 199 mg/dL 868  874  871  865  873   LDL (calc) 0 - 99 mg/dL 63  55  58  61  51   HDL-C >39 mg/dL 50  51  52  47  59   Trlycerides 0 - 149 mg/dL 95  899  93  848  79   Hemoglobin A1c 4.8 - 5.6 % 6.3  6.7  6.8  6.8  6.6      Pulmonary Assessment Scores:  Pulmonary Assessment Scores     Row Name 12/12/23 1201         ADL UCSD    ADL Phase Entry     SOB Score total 33     Rest 0     Walk 3     Stairs 5     Bath 3     Dress 0     Shop 3       CAT Score   CAT Score 12       mMRC Score   mMRC Score 3        UCSD: Self-administered rating of dyspnea associated with activities of daily living (ADLs) 6-point scale (0 = not at all to 5 = maximal or unable to do because of breathlessness)  Scoring Scores range from 0 to 120.  Minimally important difference is 5 units  CAT: CAT can identify the health impairment of COPD patients and is better correlated with disease progression.  CAT has a scoring range of zero to 40. The CAT score is classified into four groups of low (less than 10), medium (10 - 20), high (21-30) and very high (31-40) based on the impact level of disease on health status. A CAT score over 10 suggests significant symptoms.  A worsening CAT score could be explained by an exacerbation, poor medication adherence, poor inhaler technique, or progression of COPD or comorbid conditions.  CAT MCID is 2 points  mMRC: mMRC (Modified Medical Research Council) Dyspnea Scale is used to assess the degree of baseline functional disability in patients of respiratory disease due to dyspnea. No minimal important difference is established. A decrease in score of 1 point or greater is considered a positive change.   Pulmonary Function Assessment:  Pulmonary Function Assessment - 12/05/23 1411       Breath   Shortness of Breath Yes;Limiting activity          Exercise Target Goals: Exercise Program Goal: Individual exercise prescription set using results from initial 6 min walk test and THRR while considering  patients activity barriers and safety.   Exercise Prescription Goal: Initial exercise prescription builds to 30-45 minutes a day of aerobic activity, 2-3 days per week.  Home exercise guidelines will be given to patient during program as part of exercise prescription that the participant will  acknowledge.  Education: Aerobic Exercise: - Group verbal and visual presentation on the components of exercise prescription. Introduces F.I.T.T principle from ACSM for exercise prescriptions.  Reviews F.I.T.T. principles of aerobic exercise including progression. Written material provided at class time.   Education: Resistance Exercise: - Group verbal and visual presentation on the components of exercise prescription. Introduces F.I.T.T principle from ACSM for exercise prescriptions  Reviews F.I.T.T. principles of resistance exercise including progression. Written material provided at class time.    Education: Exercise & Equipment Safety: - Individual verbal instruction and demonstration of equipment use and safety with use of the equipment. Flowsheet Row Pulmonary Rehab from 01/02/2024 in [facility] Cardiac and Pulmonary Rehab  Date 12/12/23  Educator [facility]  Instruction Review Code 1- Verbalizes Understanding    Education: Exercise Physiology & General Exercise Guidelines: - Group verbal and written instruction with models to review the exercise physiology of the cardiovascular system and associated critical values. Provides general exercise guidelines with specific guidelines to those with heart or lung disease.  Flowsheet Row Pulmonary Rehab from 01/02/2024 in [facility] Cardiac and Pulmonary Rehab  Date 01/02/24  Educator nt  Instruction Review Code 1- Tefl Teacher Understanding    Education: Flexibility, Balance, Mind/Body Relaxation: - Group verbal and visual presentation with interactive activity on the components of exercise prescription. Introduces F.I.T.T principle from ACSM for exercise prescriptions. Reviews F.I.T.T. principles of flexibility and balance exercise training including progression. Also discusses the mind body connection.  Reviews various relaxation techniques to help reduce and manage stress (i.e. Deep breathing, progressive muscle relaxation, and visualization). Balance handout  provided to take home. Written material provided at class time.   Activity Barriers & Risk Stratification:  Activity Barriers & Cardiac Risk Stratification - 12/12/23 1141       Activity Barriers & Cardiac Risk Stratification   Activity Barriers Right Hip Replacement;Left Knee Replacement;Left Hip Replacement;Right Knee Replacement          6 Minute Walk:  6 Minute Walk     Row Name 12/12/23 1137         6 Minute Walk   Phase Initial     Distance 890 feet     Walk Time 6 minutes     # of Rest Breaks 0     MPH 1.69     METS 1.14     RPE 17     Perceived Dyspnea  3     VO2 Peak 3.99  Symptoms Yes (comment)     Comments SOB     Resting HR 90 bpm     Resting BP 128/82     Resting Oxygen Saturation  94 %     Exercise Oxygen Saturation  during 6 min walk 93 %     Max Ex. HR 107 bpm     Max Ex. BP 134/72     2 Minute Post BP 124/72       Interval HR   1 Minute HR 94     2 Minute HR 100     3 Minute HR 102     4 Minute HR 100     5 Minute HR 101     6 Minute HR 107     2 Minute Post HR 100     Interval Heart Rate? Yes       Interval Oxygen   Interval Oxygen? Yes     Baseline Oxygen Saturation % 94 %     1 Minute Oxygen Saturation % 94 %     1 Minute Liters of Oxygen 0 L     2 Minute Oxygen Saturation % 93 %     2 Minute Liters of Oxygen 0 L     3 Minute Oxygen Saturation % 93 %     3 Minute Liters of Oxygen 0 L     4 Minute Oxygen Saturation % 94 %     4 Minute Liters of Oxygen 0 L     5 Minute Oxygen Saturation % 96 %     5 Minute Liters of Oxygen 0 L     6 Minute Oxygen Saturation % 95 %     6 Minute Liters of Oxygen 0 L     2 Minute Post Oxygen Saturation % 95 %     2 Minute Post Liters of Oxygen 0 L       Oxygen Initial Assessment:  Oxygen Initial Assessment - 12/05/23 1411       Home Oxygen   Home Oxygen Device None    Sleep Oxygen Prescription CPAP    Liters per minute 0    Home Exercise Oxygen Prescription None    Home Resting Oxygen  Prescription None    Compliance with Home Oxygen Use Yes      Initial 6 min Walk   Oxygen Used None      Program Oxygen Prescription   Program Oxygen Prescription None      Intervention   Short Term Goals To learn and exhibit compliance with exercise, home and travel O2 prescription;To learn and understand importance of maintaining oxygen saturations>88%;To learn and demonstrate proper use of respiratory medications;To learn and demonstrate proper pursed lip breathing techniques or other breathing techniques. ;To learn and understand importance of monitoring SPO2 with pulse oximeter and demonstrate accurate use of the pulse oximeter.    Long  Term Goals Verbalizes importance of monitoring SPO2 with pulse oximeter and return demonstration;Exhibits proper breathing techniques, such as pursed lip breathing or other method taught during program session;Demonstrates proper use of MDIs;Exhibits compliance with exercise, home  and travel O2 prescription;Maintenance of O2 saturations>88%;Compliance with respiratory medication          Oxygen Re-Evaluation:  Oxygen Re-Evaluation     Row Name 12/17/23 1122             Program Oxygen Prescription   Program Oxygen Prescription None         Home Oxygen   Home  Oxygen Device None       Sleep Oxygen Prescription CPAP       Liters per minute 0       Home Exercise Oxygen Prescription None       Home Resting Oxygen Prescription None       Compliance with Home Oxygen Use Yes         Goals/Expected Outcomes   Short Term Goals To learn and exhibit compliance with exercise, home and travel O2 prescription;To learn and understand importance of maintaining oxygen saturations>88%;To learn and demonstrate proper use of respiratory medications;To learn and demonstrate proper pursed lip breathing techniques or other breathing techniques. ;To learn and understand importance of monitoring SPO2 with pulse oximeter and demonstrate accurate use of the pulse  oximeter.       Long  Term Goals Verbalizes importance of monitoring SPO2 with pulse oximeter and return demonstration;Exhibits proper breathing techniques, such as pursed lip breathing or other method taught during program session;Demonstrates proper use of MDIs;Exhibits compliance with exercise, home  and travel O2 prescription;Maintenance of O2 saturations>88%;Compliance with respiratory medication       Comments Reviewed PLB technique with pt.  Talked about how it works and it's importance in maintaining their exercise saturations.       Goals/Expected Outcomes Short: Become more profiecient at using PLB. Long: Become independent at using PLB.          Oxygen Discharge (Final Oxygen Re-Evaluation):  Oxygen Re-Evaluation - 12/17/23 1122       Program Oxygen Prescription   Program Oxygen Prescription None      Home Oxygen   Home Oxygen Device None    Sleep Oxygen Prescription CPAP    Liters per minute 0    Home Exercise Oxygen Prescription None    Home Resting Oxygen Prescription None    Compliance with Home Oxygen Use Yes      Goals/Expected Outcomes   Short Term Goals To learn and exhibit compliance with exercise, home and travel O2 prescription;To learn and understand importance of maintaining oxygen saturations>88%;To learn and demonstrate proper use of respiratory medications;To learn and demonstrate proper pursed lip breathing techniques or other breathing techniques. ;To learn and understand importance of monitoring SPO2 with pulse oximeter and demonstrate accurate use of the pulse oximeter.    Long  Term Goals Verbalizes importance of monitoring SPO2 with pulse oximeter and return demonstration;Exhibits proper breathing techniques, such as pursed lip breathing or other method taught during program session;Demonstrates proper use of MDIs;Exhibits compliance with exercise, home  and travel O2 prescription;Maintenance of O2 saturations>88%;Compliance with respiratory medication     Comments Reviewed PLB technique with pt.  Talked about how it works and it's importance in maintaining their exercise saturations.    Goals/Expected Outcomes Short: Become more profiecient at using PLB. Long: Become independent at using PLB.          Initial Exercise Prescription:  Initial Exercise Prescription - 12/12/23 1100       Date of Initial Exercise RX and Referring Provider   Date 12/12/23    Referring Provider Aleskerov      Oxygen   Maintain Oxygen Saturation 88% or higher      NuStep   Level 2    SPM 80    Minutes 15    METs 1.14      REL-XR   Level 2    Speed 50    Minutes 15    METs 1.14      Biostep-RELP  Level 1    Minutes 15    METs 1.14      Track   Laps 10    Minutes 15    METs 1.54      Prescription Details   Frequency (times per week) 2    Duration Progress to 30 minutes of continuous aerobic without signs/symptoms of physical distress      Intensity   THRR 40-80% of Max Heartrate 110-131    Ratings of Perceived Exertion 11-13    Perceived Dyspnea 0-4      Resistance Training   Training Prescription Yes    Weight 5    Reps 10-15          Perform Capillary Blood Glucose checks as needed.  Exercise Prescription Changes:   Exercise Prescription Changes     Row Name 12/12/23 1100 01/02/24 1100 01/21/24 1300         Response to Exercise   Blood Pressure (Admit) 128/82 122/60 114/60     Blood Pressure (Exercise) 134/72 148/70 150/72     Blood Pressure (Exit) 124/72 118/62 122/60     Heart Rate (Admit) 90 bpm 82 bpm 80 bpm     Heart Rate (Exercise) 107 bpm 109 bpm 105 bpm     Heart Rate (Exit) 95 bpm 99 bpm 88 bpm     Oxygen Saturation (Admit) 94 % 93 % 92 %     Oxygen Saturation (Exercise) 93 % 90 % 88 %     Oxygen Saturation (Exit) 95 % 91 % 94 %     Rating of Perceived Exertion (Exercise) 17 19 15      Perceived Dyspnea (Exercise) 3 3 2      Symptoms SOB none none     Comments 6 MWT results 1st 2 weeks of exercise  sessions --     Duration -- Progress to 30 minutes of  aerobic without signs/symptoms of physical distress Progress to 30 minutes of  aerobic without signs/symptoms of physical distress     Intensity -- THRR unchanged THRR unchanged       Progression   Progression -- Continue to progress workloads to maintain intensity without signs/symptoms of physical distress. Continue to progress workloads to maintain intensity without signs/symptoms of physical distress.     Average METs -- 2.37 2.93       Resistance Training   Training Prescription -- Yes --     Weight -- 5 5     Reps -- 10-15 10-15       Interval Training   Interval Training -- No No       NuStep   Level -- 3  T4 and T6 3     Minutes -- 15 15     METs -- 3.6 4.2       REL-XR   Level -- 2 --     Minutes -- 15 --     METs -- 2.2 --       T5 Nustep   Level -- -- 3     Minutes -- -- 15     METs -- -- 3.4       Track   Laps -- 19 20     Minutes -- 15 15     METs -- 2.03 2.09       Oxygen   Maintain Oxygen Saturation -- 88% or higher 88% or higher        Exercise Comments:   Exercise Comments     Row  Name 12/17/23 1122           Exercise Comments First full day of exercise!  Patient was oriented to gym and equipment including functions, settings, policies, and procedures.  Patient's individual exercise prescription and treatment plan were reviewed.  All starting workloads were established based on the results of the 6 minute walk test done at initial orientation visit.  The plan for exercise progression was also introduced and progression will be customized based on patient's performance and goals.          Exercise Goals and Review:   Exercise Goals     Row Name 12/12/23 1145             Exercise Goals   Increase Physical Activity Yes       Intervention Provide advice, education, support and counseling about physical activity/exercise needs.;Develop an individualized exercise prescription for  aerobic and resistive training based on initial evaluation findings, risk stratification, comorbidities and participant's personal goals.       Expected Outcomes Short Term: Attend rehab on a regular basis to increase amount of physical activity.;Long Term: Add in home exercise to make exercise part of routine and to increase amount of physical activity.;Long Term: Exercising regularly at least 3-5 days a week.       Increase Strength and Stamina Yes       Intervention Provide advice, education, support and counseling about physical activity/exercise needs.;Develop an individualized exercise prescription for aerobic and resistive training based on initial evaluation findings, risk stratification, comorbidities and participant's personal goals.       Expected Outcomes Short Term: Increase workloads from initial exercise prescription for resistance, speed, and METs.;Short Term: Perform resistance training exercises routinely during rehab and add in resistance training at home;Long Term: Improve cardiorespiratory fitness, muscular endurance and strength as measured by increased METs and functional capacity ( )       Able to understand and use rate of perceived exertion (RPE) scale Yes       Intervention Provide education and explanation on how to use RPE scale       Expected Outcomes Short Term: Able to use RPE daily in rehab to express subjective intensity level;Long Term:  Able to use RPE to guide intensity level when exercising independently       Able to understand and use Dyspnea scale Yes       Intervention Provide education and explanation on how to use Dyspnea scale       Expected Outcomes Short Term: Able to use Dyspnea scale daily in rehab to express subjective sense of shortness of breath during exertion;Long Term: Able to use Dyspnea scale to guide intensity level when exercising independently       Knowledge and understanding of Target Heart Rate Range (THRR) Yes       Intervention Provide  education and explanation of THRR including how the numbers were predicted and where they are located for reference       Expected Outcomes Short Term: Able to state/look up THRR;Long Term: Able to use THRR to govern intensity when exercising independently;Short Term: Able to use daily as guideline for intensity in rehab       Able to check pulse independently Yes       Intervention Provide education and demonstration on how to check pulse in carotid and radial arteries.;Review the importance of being able to check your own pulse for safety during independent exercise       Expected Outcomes Short Term:  Able to explain why pulse checking is important during independent exercise;Long Term: Able to check pulse independently and accurately       Understanding of Exercise Prescription Yes       Intervention Provide education, explanation, and written materials on patient's individual exercise prescription       Expected Outcomes Short Term: Able to explain program exercise prescription;Long Term: Able to explain home exercise prescription to exercise independently          Exercise Goals Re-Evaluation :  Exercise Goals Re-Evaluation     Row Name 12/17/23 1122 01/02/24 1140 01/21/24 1336 02/04/24 1128       Exercise Goal Re-Evaluation   Exercise Goals Review Increase Physical Activity;Able to understand and use rate of perceived exertion (RPE) scale;Knowledge and understanding of Target Heart Rate Range (THRR);Understanding of Exercise Prescription;Increase Strength and Stamina;Able to understand and use Dyspnea scale;Able to check pulse independently Increase Physical Activity;Understanding of Exercise Prescription;Increase Strength and Stamina Increase Physical Activity;Understanding of Exercise Prescription;Increase Strength and Stamina Increase Physical Activity;Understanding of Exercise Prescription;Increase Strength and Stamina    Comments Reviewed RPE and dyspnea scale, THR and program  prescription with pt today.  Pt voiced understanding and was given a copy of goals to take home. Madeleine is off to a good start in the program and completed his first 2 weeks in this review period. He worked at level 3 on both the T4 and T6 nusteps. He also worked at level 2 on the XR. He was able to walk 19 laps on the track. We will continue to monitor his progress in the program. Madeleine is doing well in rehab. He was able to attend 2 sessions during this review period. During those sessions he was able to walk 20 laps on the track, and level 3 on the T6 nustep. We will continue to monitor his progress in the program. Tommy last attended the program on 12/10. We will continue to stay in contact with him in order to determine his status in the program.    Expected Outcomes Short: Use RPE daily to regulate intensity. Long: Follow program prescription in THR. Short: Continue to follow current exercise prescription. Long: Continue exercise to improve strength and stamina. Short: Continue to follow current exercise prescription. Long: Continue exercise to improve strength and stamina. Short: return to rehab. Long: Continue exercise to improve strength and stamina.       Discharge Exercise Prescription (Final Exercise Prescription Changes):  Exercise Prescription Changes - 01/21/24 1300       Response to Exercise   Blood Pressure (Admit) 114/60    Blood Pressure (Exercise) 150/72    Blood Pressure (Exit) 122/60    Heart Rate (Admit) 80 bpm    Heart Rate (Exercise) 105 bpm    Heart Rate (Exit) 88 bpm    Oxygen Saturation (Admit) 92 %    Oxygen Saturation (Exercise) 88 %    Oxygen Saturation (Exit) 94 %    Rating of Perceived Exertion (Exercise) 15    Perceived Dyspnea (Exercise) 2    Symptoms none    Duration Progress to 30 minutes of  aerobic without signs/symptoms of physical distress    Intensity THRR unchanged      Progression   Progression Continue to progress workloads to maintain intensity  without signs/symptoms of physical distress.    Average METs 2.93      Resistance Training   Weight 5    Reps 10-15      Interval Training  Interval Training No      NuStep   Level 3    Minutes 15    METs 4.2      T5 Nustep   Level 3    Minutes 15    METs 3.4      Track   Laps 20    Minutes 15    METs 2.09      Oxygen   Maintain Oxygen Saturation 88% or higher          Nutrition:  Target Goals: Understanding of nutrition guidelines, daily intake of sodium 1500mg , cholesterol 200mg , calories 30% from fat and 7% or less from saturated fats, daily to have 5 or more servings of fruits and vegetables.  Education: Nutrition 1 -Group instruction provided by verbal, written material, interactive activities, discussions, models, and posters to present general guidelines for heart healthy nutrition including macronutrients, label reading, and promoting whole foods over processed counterparts. Education serves as pensions consultant of discussion of heart healthy eating for all. Written material provided at class time.     Education: Nutrition 2 -Group instruction provided by verbal, written material, interactive activities, discussions, models, and posters to present general guidelines for heart healthy nutrition including sodium, cholesterol, and saturated fat. Providing guidance of habit forming to improve blood pressure, cholesterol, and body weight. Written material provided at class time.     Biometrics:  Pre Biometrics - 12/12/23 1145       Pre Biometrics   Height 5' 11 (1.803 m)    Weight 281 lb 1.6 oz (127.5 kg)    Waist Circumference 48.5 inches    Hip Circumference 54 inches    Waist to Hip Ratio 0.9 %    BMI (Calculated) 39.22    Single Leg Stand 2.2 seconds           Nutrition Therapy Plan and Nutrition Goals:  Nutrition Therapy & Goals - 12/12/23 1125       Nutrition Therapy   Diet Cardiac, Low Na    Protein (specify units) 75    Fiber 30 grams     Whole Grain Foods 3 servings    Saturated Fats 15 max. grams    Fruits and Vegetables 5 servings/day    Sodium 2 grams      Personal Nutrition Goals   Nutrition Goal Read labels and reduce sodium intake to below 2300mg . Ideally 1500mg  per day.    Personal Goal #2 Reduce saturated fat, less than 12g per day. Replace bad fats for more heart healthy fats.    Personal Goal #3 Eat 3 times per day, small frequent meals or nutrient dense snacks    Comments Spoke with tommy and his wife about heart health. Reviewed discussion from last RD visit ~1year ago. He is still working on drinking more water . He reports not eating as much food. Spoke with him about goal of eating protein and carb at each meal and trying to eat nutrient dense foods at each meal hour. Provided guideline limits of less than 12g saturated fat and less than 1500mg  sodium per day. Reviewed a dinner they like to make of baked chicken, canned green beans. Encouraged adding small carb like 3/4 cup rice or half baked potato as a complex carb.      Intervention Plan   Intervention Prescribe, educate and counsel regarding individualized specific dietary modifications aiming towards targeted core components such as weight, hypertension, lipid management, diabetes, heart failure and other comorbidities.;Nutrition handout(s) given to patient.    Expected  Outcomes Short Term Goal: Understand basic principles of dietary content, such as calories, fat, sodium, cholesterol and nutrients.;Short Term Goal: A plan has been developed with personal nutrition goals set during dietitian appointment.;Long Term Goal: Adherence to prescribed nutrition plan.          Nutrition Assessments:  MEDIFICTS Score Key: >=70 Need to make dietary changes  40-70 Heart Healthy Diet <= 40 Therapeutic Level Cholesterol Diet  Flowsheet Row Pulmonary Rehab from 12/12/2023 in [facility] Card[facility] Pulmonary Rehab  Picture Your Plate Total Score on Admission 57   Picture  Your Plate Scores: <59 Unhealthy dietary pattern with much room for improvement. 41-50 Dietary pattern unlikely to meet recommendations for good health and room for improvement. 51-60 More healthful dietary pattern, with some room for improvement.  >60 Healthy dietary pattern, although there may be some specific behaviors that could be improved.   Nutrition Goals Re-Evaluation:  Nutrition Goals Re-Evaluation     Row Name 12/24/23 1129             Goals   Current Weight 283 lb (128.4 kg)       Comment Patient was informed on why it is important to maintain a balanced diet when dealing with Respiratory issues. Explained that it takes a lot of energy to breath and when they are short of breath often they will need to have a good diet to help keep up with the calories they are expending for breathing.       Expected Outcome Short: Choose and plan snacks accordingly to patients caloric intake to improve breathing. Long: Maintain a diet independently that meets their caloric intake to aid in daily shortness of breath.          Nutrition Goals Discharge (Final Nutrition Goals Re-Evaluation):  Nutrition Goals Re-Evaluation - 12/24/23 1129       Goals   Current Weight 283 lb (128.4 kg)    Comment Patient was informed on why it is important to maintain a balanced diet when dealing with Respiratory issues. Explained that it takes a lot of energy to breath and when they are short of breath often they will need to have a good diet to help keep up with the calories they are expending for breathing.    Expected Outcome Short: Choose and plan snacks accordingly to patients caloric intake to improve breathing. Long: Maintain a diet independently that meets their caloric intake to aid in daily shortness of breath.          Psychosocial: Target Goals: Acknowledge presence or absence of significant depression and/or stress, maximize coping skills, provide positive support system. Participant is able  to verbalize types and ability to use techniques and skills needed for reducing stress and depression.   Education: Stress, Anxiety, and Depression - Group verbal and visual presentation to define topics covered.  Reviews how body is impacted by stress, anxiety, and depression.  Also discusses healthy ways to reduce stress and to treat/manage anxiety and depression.  Written material provided at class time. Flowsheet Row Pulmonary Rehab from 01/02/2024 in [facility] Cardiac and Pulmonary Rehab  Date 12/26/23  Educator kb  Instruction Review Code 1- [employer] Understanding    Education: Sleep Hygiene -Provides group verbal and written instruction about how sleep can affect your health.  Define sleep hygiene, discuss sleep cycles and impact of sleep habits. Review good sleep hygiene tips.    Initial Review & Psychosocial Screening:  Initial Psych Review & Screening - 12/05/23 1414  Initial Review   Current issues with Current Psychotropic Meds;Current Sleep Concerns;Current Stress Concerns    Source of Stress Concerns Family      Family Dynamics   Good Support System? Yes    Comments Madeleine states his wife is a good support system for him. He has been lacking energy lately and wants to get back into the program. He takes some medication for his mood.      Barriers   Psychosocial barriers to participate in program The patient should benefit from training in stress management and relaxation.;There are no identifiable barriers or psychosocial needs.      Screening Interventions   Interventions Provide feedback about the scores to participant;To provide support and resources with identified psychosocial needs;Encouraged to exercise    Expected Outcomes Short Term goal: Utilizing psychosocial counselor, staff and physician to assist with identification of specific Stressors or current issues interfering with healing process. Setting desired goal for each stressor or current issue  identified.;Long Term Goal: Stressors or current issues are controlled or eliminated.;Short Term goal: Identification and review with participant of any Quality of Life or Depression concerns found by scoring the questionnaire.;Long Term goal: The participant improves quality of Life and PHQ9 Scores as seen by post scores and/or verbalization of changes          Quality of Life Scores:  Scores of 19 and below usually indicate a poorer quality of life in these areas.  A difference of  2-3 points is a clinically meaningful difference.  A difference of 2-3 points in the total score of the Quality of Life Index has been associated with significant improvement in overall quality of life, self-image, physical symptoms, and general health in studies assessing change in quality of life.  PHQ-9: Review Flowsheet  More data exists      01/08/2024 12/24/2023 12/12/2023 12/04/2023 11/02/2023  Depression screen PHQ 2/9  Decreased Interest 0 0 0 0 0  Down, Depressed, Hopeless 0 0 0 0 0  PHQ - 2 Score 0 0 0 0 0  Altered sleeping 1 0 1 - -  Tired, decreased energy 1 2 3  - -  Change in appetite 0 2 0 - -  Feeling bad or failure about yourself  0 0 0 - -  Trouble concentrating 0 0 0 - -  Moving slowly or fidgety/restless 0 0 2 - -  Suicidal thoughts 0 0 0 - -  PHQ-9 Score 2 4 6  - -  Difficult doing work/chores Not difficult at all Not difficult at all Somewhat difficult - -   Interpretation of Total Score  Total Score Depression Severity:  1-4 = Minimal depression, 5-9 = Mild depression, 10-14 = Moderate depression, 15-19 = Moderately severe depression, 20-27 = Severe depression   Psychosocial Evaluation and Intervention:  Psychosocial Evaluation - 12/05/23 1416       Psychosocial Evaluation & Interventions   Interventions Relaxation education;Encouraged to exercise with the program and follow exercise prescription;Stress management education    Comments Madeleine states his wife is a good support  system for him. He has been lacking energy lately and wants to get back into the program. He takes some medication for his mood.    Expected Outcomes Short: Start [facility] to help with mood. Long: Maintain a healthy mental state.    Continue Psychosocial Services  Follow up required by staff          Psychosocial Re-Evaluation:  Psychosocial Re-Evaluation     Row Name 12/24/23 1133  Psychosocial Re-Evaluation   Current issues with Current Stress Concerns       Comments Reviewed patient health questionnaire (PHQ-9) with patient for follow up. Previously, patients score indicate signs/symptoms of depression.  Reviewed to see if patient is improving symptom wise while in program.  Score improved and patient states that it is because he has been able to exercise more and gain stamina.       Expected Outcomes Short: Continue to attend [facility] regularly for regular exercise and social engagement. Long: Continue to improve symptoms and manage a positive mental state.       Interventions Encouraged to attend Pulmonary Rehabilitation for the exercise       Continue Psychosocial Services  Follow up required by staff          Psychosocial Discharge (Final Psychosocial Re-Evaluation):  Psychosocial Re-Evaluation - 12/24/23 1133       Psychosocial Re-Evaluation   Current issues with Current Stress Concerns    Comments Reviewed patient health questionnaire (PHQ-9) with patient for follow up. Previously, patients score indicate signs/symptoms of depression.  Reviewed to see if patient is improving symptom wise while in program.  Score improved and patient states that it is because he has been able to exercise more and gain stamina.    Expected Outcomes Short: Continue to attend [facility] regularly for regular exercise and social engagement. Long: Continue to improve symptoms and manage a positive mental state.    Interventions Encouraged to attend Pulmonary Rehabilitation for the  exercise    Continue Psychosocial Services  Follow up required by staff          Education: Education Goals: Education classes will be provided on a weekly basis, covering required topics. Participant will state understanding/return demonstration of topics presented.  Learning Barriers/Preferences:  Learning Barriers/Preferences - 12/05/23 1414       Learning Barriers/Preferences   Learning Barriers None    Learning Preferences None          General Pulmonary Education Topics:  Infection Prevention: - Provides verbal and written material to individual with discussion of infection control including proper hand washing and proper equipment cleaning during exercise session. Flowsheet Row Pulmonary Rehab from 01/02/2024 in [facility] Cardiac and Pulmonary Rehab  Date 12/12/23  Educator [facility]  Instruction Review Code 1- Verbalizes Understanding    Falls Prevention: - Provides verbal and written material to individual with discussion of falls prevention and safety. Flowsheet Row Pulmonary Rehab from 01/02/2024 in [facility] Cardiac and Pulmonary Rehab  Date 12/12/23  Educator [facility]  Instruction Review Code 1- Verbalizes Understanding    Chronic Lung Disease Review: - Group verbal instruction with posters, models, PowerPoint presentations and videos,  to review new updates, new respiratory medications, new advancements in procedures and treatments. Providing information on websites and 800 numbers for continued self-education. Includes information about supplement oxygen, available portable oxygen systems, continuous and intermittent flow rates, oxygen safety, concentrators, and Medicare reimbursement for oxygen. Explanation of Pulmonary Drugs, including class, frequency, complications, importance of spacers, rinsing mouth after steroid MDI's, and proper cleaning methods for nebulizers. Review of basic lung anatomy and physiology related to function, structure, and complications of lung disease.  Review of risk factors. Discussion about methods for diagnosing sleep apnea and types of masks and machines for OSA. Includes a review of the use of types of environmental controls: home humidity, furnaces, filters, dust mite/pet prevention, HEPA vacuums. Discussion about weather changes, air quality and the benefits of nasal washing. Instruction on Warning signs, infection  symptoms, calling MD promptly, preventive modes, and value of vaccinations. Review of effective airway clearance, coughing and/or vibration techniques. Emphasizing that all should Create an Action Plan. Written material provided at class time. Flowsheet Row Pulmonary Rehab from 01/02/2024 in [facility] Cardiac and Pulmonary Rehab  Education need identified 12/12/23    AED/CPR: - Group verbal and written instruction with the use of models to demonstrate the basic use of the AED with the basic ABC's of resuscitation.    Tests and Procedures:  - Group verbal and visual presentation and models provide information about basic cardiac anatomy and function. Reviews the testing methods done to diagnose heart disease and the outcomes of the test results. Describes the treatment choices: Medical Management, Angioplasty, or Coronary Bypass Surgery for treating various heart conditions including Myocardial Infarction, Angina, Valve Disease, and Cardiac Arrhythmias.  Written material provided at class time.   Medication Safety: - Group verbal and visual instruction to review commonly prescribed medications for heart and lung disease. Reviews the medication, class of the drug, and side effects. Includes the steps to properly store meds and maintain the prescription regimen.  Written material given at graduation.   Other: -Provides group and verbal instruction on various topics (see comments)   Knowledge Questionnaire Score:  Knowledge Questionnaire Score - 12/12/23 1200       Knowledge Questionnaire Score   Pre Score 14/18            Core Components/Risk Factors/Patient Goals at Admission:  Personal Goals and Risk Factors at Admission - 12/12/23 1156       Core Components/Risk Factors/Patient Goals on Admission    Weight Management Yes;Weight Loss    Intervention Weight Management: Develop a combined nutrition and exercise program designed to reach desired caloric intake, while maintaining appropriate intake of nutrient and fiber, sodium and fats, and appropriate energy expenditure required for the weight goal.;Weight Management: Provide education and appropriate resources to help participant work on and attain dietary goals.;Weight Management/Obesity: Establish reasonable short term and long term weight goals.;Obesity: Provide education and appropriate resources to help participant work on and attain dietary goals.    Admit Weight 281 lb 1.6 oz (127.5 kg)    Goal Weight: Short Term 275 lb (124.7 kg)    Goal Weight: Long Term 270 lb (122.5 kg)    Expected Outcomes Short Term: Continue to assess and modify interventions until short term weight is achieved;Long Term: Adherence to nutrition and physical activity/exercise program aimed toward attainment of established weight goal;Weight Loss: Understanding of general recommendations for a balanced deficit meal plan, which promotes 1-2 lb weight loss per week and includes a negative energy balance of [phone] kcal/d;Understanding recommendations for meals to include 15-35% energy as protein, 25-35% energy from fat, 35-60% energy from carbohydrates, less than 200mg  of dietary cholesterol, 20-35 gm of total fiber daily;Understanding of distribution of calorie intake throughout the day with the consumption of 4-5 meals/snacks    Improve shortness of breath with ADL's Yes    Intervention Provide education, individualized exercise plan and daily activity instruction to help decrease symptoms of SOB with activities of daily living.    Expected Outcomes Short Term: Improve cardiorespiratory  fitness to achieve a reduction of symptoms when performing ADLs;Long Term: Be able to perform more ADLs without symptoms or delay the onset of symptoms    Diabetes Yes    Intervention Provide education about signs/symptoms and action to take for hypo/hyperglycemia.;Provide education about proper nutrition, including hydration, and aerobic/resistive exercise prescription along with  prescribed medications to achieve blood glucose in normal ranges: Fasting glucose 65-99 mg/dL    Expected Outcomes Short Term: Participant verbalizes understanding of the signs/symptoms and immediate care of hyper/hypoglycemia, proper foot care and importance of medication, aerobic/resistive exercise and nutrition plan for blood glucose control.;Long Term: Attainment of HbA1C < 7%.    Hypertension Yes    Intervention Provide education on lifestyle modifcations including regular physical activity/exercise, weight management, moderate sodium restriction and increased consumption of fresh fruit, vegetables, and low fat dairy, alcohol moderation, and smoking cessation.;Monitor prescription use compliance.    Expected Outcomes Short Term: Continued assessment and intervention until BP is < 140/77mm HG in hypertensive participants. < 130/40mm HG in hypertensive participants with diabetes, heart failure or chronic kidney disease.;Long Term: Maintenance of blood pressure at goal levels.    Lipids Yes    Intervention Provide education and support for participant on nutrition & aerobic/resistive exercise along with prescribed medications to achieve LDL 70mg , HDL >40mg .    Expected Outcomes Short Term: Participant states understanding of desired cholesterol values and is compliant with medications prescribed. Participant is following exercise prescription and nutrition guidelines.;Long Term: Cholesterol controlled with medications as prescribed, with individualized exercise RX and with personalized nutrition plan. Value goals: LDL < 70mg ,  HDL > 40 mg.          Education:Diabetes - Individual verbal and written instruction to review signs/symptoms of diabetes, desired ranges of glucose level fasting, after meals and with exercise. Acknowledge that pre and post exercise glucose checks will be done for 3 sessions at entry of program. Flowsheet Row Pulmonary Rehab from 12/07/2022 in [facility] Cardiac and Pulmonary Rehab  Date 10/18/22  Educator [facility]  Instruction Review Code 1- Verbalizes Understanding    Know Your Numbers and Heart Failure: - Group verbal and visual instruction to discuss disease risk factors for cardiac and pulmonary disease and treatment options.  Reviews associated critical values for Overweight/Obesity, Hypertension, Cholesterol, and Diabetes.  Discusses basics of heart failure: signs/symptoms and treatments.  Introduces Heart Failure Zone chart for action plan for heart failure. Written material provided at class time. Flowsheet Row Pulmonary Rehab from 01/02/2024 in [facility] Cardiac and Pulmonary Rehab  Date 12/19/23  Educator KB  Instruction Review Code 1- Verbalizes Understanding    Core Components/Risk Factors/Patient Goals Review:   Goals and Risk Factor Review     Row Name 12/24/23 1128             Core Components/Risk Factors/Patient Goals Review   Personal Goals Review Improve shortness of breath with ADL's       Review Spoke to patient about their shortness of breath and what they can do to improve. Patient has been informed of breathing techniques when starting the program. Patient is informed to tell staff if they have had any med changes and that certain meds they are taking or not taking can be causing shortness of breath.       Expected Outcomes Short: Attend [facility] regularly to improve shortness of breath with ADL's. Long: maintain independence with ADL's          Core Components/Risk Factors/Patient Goals at Discharge (Final Review):   Goals and Risk Factor Review - 12/24/23 1128        Core Components/Risk Factors/Patient Goals Review   Personal Goals Review Improve shortness of breath with ADL's    Review Spoke to patient about their shortness of breath and what they can do to improve. Patient has been informed of breathing techniques  when starting the program. Patient is informed to tell staff if they have had any med changes and that certain meds they are taking or not taking can be causing shortness of breath.    Expected Outcomes Short: Attend [facility] regularly to improve shortness of breath with ADL's. Long: maintain independence with ADL's          ITP Comments:  ITP Comments     Row Name 12/05/23 1422 12/12/23 1137 12/17/23 1121 01/09/24 0858 01/28/24 1114   ITP Comments Virtual Visit completed. Patient informed on EP and RD appointment and 6 Minute walk test. Patient also informed of patient health questionnaires on My Chart. Patient Verbalizes understanding. Visit diagnosis can be found in [facility] 11/27/2023. Completed and gym orientation for respiratory care services. Initial ITP created and sent for review to Dr. Fuad Aleskerov, Medical Director. First full day of exercise!  Patient was oriented to gym and equipment including functions, settings, policies, and procedures.  Patient's individual exercise prescription and treatment plan were reviewed.  All starting workloads were established based on the results of the 6 minute walk test done at initial orientation visit.  The plan for exercise progression was also introduced and progression will be customized based on patient's performance and goals. 30 Day review completed. Medical Director ITP review done, changes made as directed, and signed approval by Medical Director. New to program. Cracked a rib early December, attempted to attend program today. Sent home to talk with his doctor;as he is still hurting and unable to manage ADLs well. Is sleeping in recliner, unable to get into his bed    Row Name 02/06/24 1050            ITP Comments 30 Day review completed. Medical Director ITP review done, changes made as directed, and signed approval by Medical Director. Has been on hold since early December because of a fall which resulted in cracked ribs. Plans to return when healed          Comments: 30 Day Review      [1]  Current Outpatient Medications:    acetaminophen  (TYLENOL ) 650 MG CR tablet, Take 650 mg by mouth every 8 (eight) hours as needed for pain., Disp: , Rfl:    albuterol  (VENTOLIN  HFA) 108 (90 Base) MCG/ACT inhaler, Inhale 2 puffs into the lungs., Disp: , Rfl:    apixaban  (ELIQUIS ) 5 MG TABS tablet, Take 1 tablet (5 mg total) by mouth 2 (two) times daily., Disp: 180 tablet, Rfl: 1   Ascorbic Acid  (VITAMIN C ) 1000 MG tablet, Take 1,000 mg by mouth daily., Disp: , Rfl:    Blood Glucose Monitoring Suppl (ONETOUCH VERIO) w/Device KIT, Use to check blood sugar 3 times a day and document results, bring to appointments.  Goal is <130 fasting blood sugar and <180 two hours after meals., Disp: 1 kit, Rfl: 0   calcium -vitamin D  (OSCAL WITH D) 500-5 MG-MCG tablet, Take 1 tablet by mouth daily with breakfast., Disp: , Rfl:    Cranberry 500 MG CAPS, Take 500 mg by mouth daily., Disp: , Rfl:    docusate (COLACE) 50 MG/5ML liquid, Take 50 mg by mouth daily., Disp: , Rfl:    gabapentin  (NEURONTIN ) 600 MG tablet, Take 1 tablet (600 mg total) by mouth at bedtime., Disp: 90 tablet, Rfl: 4   glucose blood test strip, Use to check blood sugar 3 times a day and document results, bring to appointments.  Goal is <130 fasting blood sugar and <180 two hours  after meals., Disp: 100 each, Rfl: 12   HYDROcodone -acetaminophen  (NORCO/VICODIN) 5-325 MG tablet, Take 1 tablet by mouth 2 (two) times daily., Disp: 60 tablet, Rfl: 0   Lancets (ONETOUCH ULTRASOFT) lancets, Use to check blood sugar 3 times a day and document results, bring to appointments.  Goal is <130 fasting blood sugar and <180 two hours after meals., Disp:  100 each, Rfl: 12   LORazepam  (ATIVAN ) 1 MG tablet, TAKE 1 TABLET BY MOUTH EVERYDAY AT BEDTIME, Disp: 90 tablet, Rfl: 0   Magnesium  400 MG TABS, Take 400 mg by mouth daily. , Disp: , Rfl:    MELATONIN PO, Take 10 mg by mouth at bedtime., Disp: , Rfl:    Multiple Vitamin (MULTIVITAMIN WITH MINERALS) TABS tablet, Take 1 tablet by mouth daily. Centrum Silver, Disp: , Rfl:    NON FORMULARY, Pt uses a cpap nightly, Disp: , Rfl:    nystatin  cream (MYCOSTATIN ), Apply 1 Application topically 2 (two) times daily., Disp: 30 g, Rfl: 4   Omega-3 Fatty Acids (FISH OIL) 1200 MG CAPS, Take 1,200 mg by mouth daily., Disp: , Rfl:    oxymetazoline  (AFRIN) 0.05 % nasal spray, Place 2 sprays into both nostrils at bedtime., Disp: , Rfl:    pantoprazole  (PROTONIX ) 20 MG tablet, Take 1 tablet (20 mg total) by mouth daily., Disp: 90 tablet, Rfl: 2   potassium chloride  SA (KLOR-CON  M) 20 MEQ tablet, Take 2 tablets (40 meq) with Torsemide , Disp: 30 tablet, Rfl: 3   QUEtiapine  Fumarate (SEROQUEL  XR) 150 MG 24 hr tablet, Take 1 tablet (150 mg total) by mouth at bedtime., Disp: 90 tablet, Rfl: 4   rOPINIRole  (REQUIP ) 0.25 MG tablet, Take 0.25 mg by mouth 4 (four) times daily., Disp: , Rfl:    rosuvastatin  (CRESTOR ) 40 MG tablet, Take 1 tablet (40 mg total) by mouth daily., Disp: 90 tablet, Rfl: 3   solifenacin  (VESICARE ) 10 MG tablet, Take 1 tablet (10 mg total) by mouth daily., Disp: 90 tablet, Rfl: 2   tiZANidine  (ZANAFLEX ) 4 MG tablet, Take 1 tablet (4 mg total) by mouth every 6 (six) hours as needed for muscle spasms., Disp: 90 tablet, Rfl: 4   torsemide  (DEMADEX ) 20 MG tablet, TAKE 2 TABLETS (40 MG TOTAL) BY MOUTH DAILY AS NEEDED (FLUID RETENTION)., Disp: 90 tablet, Rfl: 0   venlafaxine  XR (EFFEXOR -XR) 75 MG 24 hr capsule, Take 3 capsules (225 mg total) by mouth daily., Disp: 270 capsule, Rfl: 3 [2]  Social History Tobacco Use  Smoking Status Never   Passive exposure: Past  Smokeless Tobacco Never

## 2024-02-11 ENCOUNTER — Encounter

## 2024-02-13 ENCOUNTER — Encounter

## 2024-02-16 NOTE — Patient Instructions (Incomplete)
 Rib Fracture  A rib fracture is a break or crack in one of the bones of the ribs. The ribs are like a cage that goes around your upper chest. A broken or cracked rib is often painful, but most do not cause other problems. Most rib fractures usually heal on their own in 1-3 months. What are the causes? Doing movements over and over again with a lot of force, such as pitching a baseball or having a very bad cough. A direct hit to the chest. Cancer that has spread to the bones. What are the signs or symptoms? Pain when you breathe in or cough. Pain when someone presses on the injured area. Feeling short of breath. How is this treated? Treatment depends on how bad the fracture is. In general: Most rib fractures usually heal on their own in 1-3 months. Healing may take longer if you have a cough or are doing activities that make the injury worse. While you heal, you may be given medicines to control pain. You will also be taught deep breathing exercises. Very bad injuries may require a stay at the hospital or surgery. Follow these instructions at home: Managing pain, stiffness, and swelling If told, put ice on the injured area. To do this: Put ice in a plastic bag. Place a towel between your skin and the bag. Leave the ice on for 20 minutes, 2-3 times a day. Take off the ice if your skin turns bright red. This is very important. If you cannot feel pain, heat, or cold, you have a greater risk of damage to the area. Take over-the-counter and prescription medicines only as told by your doctor. Activity Avoid activities that cause pain to the injured area. Protect your injured area. Slowly increase activity as told by your doctor. General instructions Do deep breathing exercises as told by your doctor. You may be told to: Take deep breaths many times a day. Cough several times a day while hugging a pillow. Use a device (incentive spirometer) to do deep breathing many times a day. Drink  enough fluid to keep your pee (urine) clear or pale yellow. Do not wear a rib belt or binder. Keep all follow-up visits. Contact a doctor if: You have a fever. Get help right away if: You have trouble breathing. You are short of breath. You cannot stop coughing. You cough up thick or bloody spit. You feel like you may vomit (nauseous), vomit, or have belly (abdominal) pain. Your pain gets worse and medicine does not help. These symptoms may be an emergency. Get help right away. Call your local emergency services (911 in the [place] Do not wait to see if the symptoms will go away. Do not drive yourself to the hospital. Summary A rib fracture is a break or crack in one of the bones of the ribs. Apply ice to the injured area and take medicines for pain as told by your doctor. Take deep breaths and cough several times a day. Hug a pillow every time you cough. This information is not intended to replace advice given to you by your health care provider. Make sure you discuss any questions you have with your health care provider. Document Revised: 11/27/2022 Document Reviewed: 11/27/2022 Elsevier Patient Education  2024 [employer].

## 2024-02-18 ENCOUNTER — Encounter

## 2024-02-19 ENCOUNTER — Telehealth: Payer: Self-pay | Admitting: Nurse Practitioner

## 2024-02-19 ENCOUNTER — Encounter: Payer: Self-pay | Admitting: Nurse Practitioner

## 2024-02-19 ENCOUNTER — Ambulatory Visit: Admitting: Nurse Practitioner

## 2024-02-19 NOTE — Telephone Encounter (Unsigned)
 Copied from CRM #[id number]. Topic: General - Other >> Feb 19, 2024 12:20 PM Zebedee SAUNDERS wrote: Reason for CRM: Pt's wife Quevedo,[place] [phone] or [phone] calling on behalf of pt. Pt needs letter for okay to begin exercise with [facility] for pt's appt tomorrow 02/20/2024.

## 2024-02-19 NOTE — Telephone Encounter (Signed)
 Copied from CRM #[id number]. Topic: Appointments - Scheduling Inquiry for Clinic >> Feb 18, 2024  3:38 PM Kevelyn M wrote: Patient's wife to reschedule appointment. Patient would need an appointment this week. No appointments populated until April 14, 2024. Patient can not return to pulmonary heart track program until he sees Jolene because for RIB FRACTURES -- possible swallow study needed. He can have a virtual appointment.   Call back # [phone]

## 2024-02-19 NOTE — Telephone Encounter (Signed)
 Called to reschedule appt.

## 2024-02-19 NOTE — Telephone Encounter (Signed)
 Would you be ok with writing this for the patient?

## 2024-02-19 NOTE — Telephone Encounter (Signed)
 Called and spoke with patient's wife. She states that the patient is feeling a lot better and is not feeling any pain. Requesting letter to be sent to patient's mychart as they need it tomorrow.

## 2024-02-19 NOTE — Telephone Encounter (Signed)
 Copied from CRM #[id number]. Topic: General - Other >> Feb 19, 2024 12:20 PM Zebedee SAUNDERS wrote: Reason for CRM: Pt's wife Quevedo,[place] [phone] or [phone] calling on behalf of pt. Pt needs letter for okay to begin exercise with [facility] for pt's appt tomorrow 02/20/2024.

## 2024-02-20 ENCOUNTER — Other Ambulatory Visit (HOSPITAL_COMMUNITY): Payer: Self-pay | Admitting: Neurology

## 2024-02-20 ENCOUNTER — Encounter

## 2024-02-20 DIAGNOSIS — R413 Other amnesia: Secondary | ICD-10-CM

## 2024-02-20 DIAGNOSIS — Z950 Presence of cardiac pacemaker: Secondary | ICD-10-CM

## 2024-02-20 DIAGNOSIS — R0600 Dyspnea, unspecified: Secondary | ICD-10-CM

## 2024-02-20 NOTE — Progress Notes (Signed)
 Daily Session Note  Patient Details  Name: Nicholas [name] MRN: [id number] Date of Birth: [birth year] Referring Provider:   Flowsheet Row Pulmonary Rehab from 12/12/2023 in [facility] Cardiac and Pulmonary Rehab  Referring Provider [name]    Encounter Date: 02/20/2024  Check In:  Session Check In - 02/20/24 1008       Check-In   Supervising physician immediately available to respond to emergencies See telemetry face sheet for immediately available ER MD    Location [facility]-Cardiac & Pulmonary Rehab    Staff Present Burnard Davenport RN,BSN,MPA;Margaret Best, MS, Exercise Physiologist;Noah Tickle, BS, Exercise Physiologist;Joseph Rolinda RCP,RRT,BSRT    Virtual Visit No    Medication changes reported     No    Fall or balance concerns reported    No    Warm-up and Cool-down Performed on first and last piece of equipment    Resistance Training Performed Yes    VAD Patient? No    PAD/SET Patient? No      Pain Assessment   Currently in Pain? No/denies             Tobacco Use History[1]  Goals Met:  Proper associated with RPD/PD & O2 Sat Independence with exercise equipment Using PLB without cueing & demonstrates good technique Exercise tolerated well No report of concerns or symptoms today Strength training completed today  Goals Unmet:  Not Applicable  Comments: Pt able to follow exercise prescription today without complaint.  Will continue to monitor for progression.    Dr. Oneil Pinal is Medical Director for [facility] Cardiac Rehabilitation.  Dr. Fuad Aleskerov is Medical Director for [facility] Pulmonary Rehabilitation.    [1]  Social History Tobacco Use  Smoking Status Never   Passive exposure: Past  Smokeless Tobacco Never

## 2024-02-25 ENCOUNTER — Encounter

## 2024-02-25 ENCOUNTER — Ambulatory Visit: Admitting: Anesthesiology

## 2024-02-25 ENCOUNTER — Encounter: Payer: Self-pay | Admitting: Anesthesiology

## 2024-02-25 DIAGNOSIS — G894 Chronic pain syndrome: Secondary | ICD-10-CM | POA: Diagnosis not present

## 2024-02-25 DIAGNOSIS — F1190 Opioid use, unspecified, uncomplicated: Secondary | ICD-10-CM | POA: Diagnosis not present

## 2024-02-25 DIAGNOSIS — M47816 Spondylosis without myelopathy or radiculopathy, lumbar region: Secondary | ICD-10-CM

## 2024-02-25 DIAGNOSIS — M1611 Unilateral primary osteoarthritis, right hip: Secondary | ICD-10-CM

## 2024-02-25 DIAGNOSIS — M5416 Radiculopathy, lumbar region: Secondary | ICD-10-CM | POA: Diagnosis not present

## 2024-02-25 DIAGNOSIS — M47817 Spondylosis without myelopathy or radiculopathy, lumbosacral region: Secondary | ICD-10-CM

## 2024-02-25 DIAGNOSIS — M5432 Sciatica, left side: Secondary | ICD-10-CM | POA: Diagnosis not present

## 2024-02-25 DIAGNOSIS — M5431 Sciatica, right side: Secondary | ICD-10-CM

## 2024-02-25 DIAGNOSIS — M79642 Pain in left hand: Secondary | ICD-10-CM

## 2024-02-25 DIAGNOSIS — M79641 Pain in right hand: Secondary | ICD-10-CM | POA: Diagnosis not present

## 2024-02-25 MED ORDER — HYDROCODONE-ACETAMINOPHEN 5-325 MG PO TABS: 1.0000 | ORAL_TABLET | Freq: Three times a day (TID) | ORAL | 0 refills | Status: AC

## 2024-02-25 NOTE — Progress Notes (Signed)
 Virtual Visit via Telephone Note  I connected with Nicholas [name] on 02/25/24 at  1:00 PM EST by telephone and verified that I am speaking with the correct person using two identifiers.  Location: Patient: Home Provider: Pain control center   I discussed the limitations, risks, security and privacy concerns of performing an evaluation and management service by telephone and the availability of in person appointments. I also discussed with the patient that there may be a patient responsible charge related to this service. The patient expressed understanding and agreed to proceed.   History of Present Illness: I spoke to Nicholas [name] via telephone as we could not like for the video portion the covers.  He reports that he is doing well in regards to control of his back pain.  The quality characteristic and distribution of this been stable despite the colder weather.  He takes his Vicodin twice a day to help with control of this.  He does note that he is having a lot more trouble with hand pain.  He takes multiple Tylenol  during the day to assist with control of that but the hydrocodone  works better for him.  Because of his Eliquis  he is unsure of whether he can take any anti-inflammatories in combination.  Otherwise he is in his usual state of health.  No other changes in lower extremity strength function bowel or bladder function is noted.  Review of systems: General: No fevers or chills Pulmonary: No shortness of breath or dyspnea Cardiac: No angina or palpitations or lightheadedness GI: No abdominal pain or constipation Psych: No depression    Observations/Objective: Current Medications[1]  Past Medical History:  Diagnosis Date   Abdominal pain, chronic, right lower quadrant 12/13/2020   Anterior urethral stricture    Anxiety    Arthritis    a. knees, hips, hands;  b. 11/2013 s/p L TKA @ [facility].   Bile reflux gastritis    Bulging lumbar disc    BXO (balanitis xerotica obliterans)     Complete heart block (HCC)    a. s/p MDT dual chamber (His bundle) pacemaker 01/2016 Dr Fernande   Depression    Diabetes mellitus without complication (HCC)    DVT (deep venous thrombosis) (HCC)    Erosive esophagitis    GERD (gastroesophageal reflux disease)    Gross hematuria    Hyperlipemia    Hypertension    borderline   Internal hemorrhoids    Orthostatic hypotension    Phimosis    Presence of permanent cardiac pacemaker    Pulmonary embolism (HCC)    RLS (restless legs syndrome)    Sensory neuropathy    Sleep apnea     Assessment and Plan: 1. Chronic pain syndrome   2. Lumbar radiculopathy   3. Bilateral sciatica   4. Facet syndrome, lumbar   5. Bilateral hand pain   6. Chronic, continuous use of opioids   7. Osteoarthritis of right hip, unspecified osteoarthritis type   8. Facet arthritis of lumbosacral region    Based on our conversation today and upon review of the [place]  practitioner database information I think is appropriate and refill his medicine.  I going to do this over the course of the winter and spring at the 90 tablet dosing range for 1 tablet 3 times a day as discussed with him.  I have asked that he follow-up with his primary care physician regarding adding in an anti-inflammatory if acceptable being that he is on Eliquis .  Continue with acetaminophen   as he is doing staying under the 4 g daily limit.  Continue stretching strengthening and efforts at weight loss.  Will schedule him for a 78-month return to clinic.  Follow Up Instructions:    I discussed the assessment and treatment plan with the patient. The patient was provided an opportunity to ask questions and all were answered. The patient agreed with the plan and demonstrated an understanding of the instructions.   The patient was advised to call back or seek an in-person evaluation if the symptoms worsen or if the condition fails to improve as anticipated.  I provided 30 minutes of  non-face-to-face time during this encounter.   Lynwood KANDICE Clause, MD     [1]  Current Outpatient Medications:    [START ON 03/31/2024] HYDROcodone -acetaminophen  (NORCO/VICODIN) 5-325 MG tablet, Take 1 tablet by mouth 3 (three) times daily., Disp: 90 tablet, Rfl: 0   acetaminophen  (TYLENOL ) 650 MG CR tablet, Take 650 mg by mouth every 8 (eight) hours as needed for pain., Disp: , Rfl:    albuterol  (VENTOLIN  HFA) 108 (90 Base) MCG/ACT inhaler, Inhale 2 puffs into the lungs., Disp: , Rfl:    apixaban  (ELIQUIS ) 5 MG TABS tablet, Take 1 tablet (5 mg total) by mouth 2 (two) times daily., Disp: 180 tablet, Rfl: 1   Ascorbic Acid  (VITAMIN C ) 1000 MG tablet, Take 1,000 mg by mouth daily., Disp: , Rfl:    Blood Glucose Monitoring Suppl (ONETOUCH VERIO) w/Device KIT, Use to check blood sugar 3 times a day and document results, bring to appointments.  Goal is <130 fasting blood sugar and <180 two hours after meals., Disp: 1 kit, Rfl: 0   calcium -vitamin D  (OSCAL WITH D) 500-5 MG-MCG tablet, Take 1 tablet by mouth daily with breakfast., Disp: , Rfl:    Cranberry 500 MG CAPS, Take 500 mg by mouth daily., Disp: , Rfl:    docusate (COLACE) 50 MG/5ML liquid, Take 50 mg by mouth daily., Disp: , Rfl:    gabapentin  (NEURONTIN ) 600 MG tablet, Take 1 tablet (600 mg total) by mouth at bedtime., Disp: 90 tablet, Rfl: 4   glucose blood test strip, Use to check blood sugar 3 times a day and document results, bring to appointments.  Goal is <130 fasting blood sugar and <180 two hours after meals., Disp: 100 each, Rfl: 12   [START ON 03/01/2024] HYDROcodone -acetaminophen  (NORCO/VICODIN) 5-325 MG tablet, Take 1 tablet by mouth 3 (three) times daily., Disp: 90 tablet, Rfl: 0   Lancets (ONETOUCH ULTRASOFT) lancets, Use to check blood sugar 3 times a day and document results, bring to appointments.  Goal is <130 fasting blood sugar and <180 two hours after meals., Disp: 100 each, Rfl: 12   LORazepam  (ATIVAN ) 1 MG tablet, TAKE 1 TABLET  BY MOUTH EVERYDAY AT BEDTIME, Disp: 90 tablet, Rfl: 0   Magnesium  400 MG TABS, Take 400 mg by mouth daily. , Disp: , Rfl:    MELATONIN PO, Take 10 mg by mouth at bedtime., Disp: , Rfl:    Multiple Vitamin (MULTIVITAMIN WITH MINERALS) TABS tablet, Take 1 tablet by mouth daily. Centrum Silver, Disp: , Rfl:    NON FORMULARY, Pt uses a cpap nightly, Disp: , Rfl:    nystatin  cream (MYCOSTATIN ), Apply 1 Application topically 2 (two) times daily., Disp: 30 g, Rfl: 4   Omega-3 Fatty Acids (FISH OIL) 1200 MG CAPS, Take 1,200 mg by mouth daily., Disp: , Rfl:    oxymetazoline  (AFRIN) 0.05 % nasal spray, Place 2 sprays into  both nostrils at bedtime., Disp: , Rfl:    pantoprazole  (PROTONIX ) 20 MG tablet, Take 1 tablet (20 mg total) by mouth daily., Disp: 90 tablet, Rfl: 2   potassium chloride  SA (KLOR-CON  M) 20 MEQ tablet, Take 2 tablets (40 meq) with Torsemide , Disp: 30 tablet, Rfl: 3   QUEtiapine  Fumarate (SEROQUEL  XR) 150 MG 24 hr tablet, Take 1 tablet (150 mg total) by mouth at bedtime., Disp: 90 tablet, Rfl: 4   rOPINIRole  (REQUIP ) 0.25 MG tablet, Take 0.25 mg by mouth 4 (four) times daily., Disp: , Rfl:    rosuvastatin  (CRESTOR ) 40 MG tablet, Take 1 tablet (40 mg total) by mouth daily., Disp: 90 tablet, Rfl: 3   solifenacin  (VESICARE ) 10 MG tablet, Take 1 tablet (10 mg total) by mouth daily., Disp: 90 tablet, Rfl: 2   tiZANidine  (ZANAFLEX ) 4 MG tablet, Take 1 tablet (4 mg total) by mouth every 6 (six) hours as needed for muscle spasms., Disp: 90 tablet, Rfl: 4   torsemide  (DEMADEX ) 20 MG tablet, TAKE 2 TABLETS (40 MG TOTAL) BY MOUTH DAILY AS NEEDED (FLUID RETENTION)., Disp: 90 tablet, Rfl: 0   venlafaxine  XR (EFFEXOR -XR) 75 MG 24 hr capsule, Take 3 capsules (225 mg total) by mouth daily., Disp: 270 capsule, Rfl: 3

## 2024-02-27 ENCOUNTER — Encounter

## 2024-02-27 DIAGNOSIS — R0600 Dyspnea, unspecified: Secondary | ICD-10-CM

## 2024-02-27 NOTE — Progress Notes (Signed)
 Daily Session Note  Patient Details  Name: Nicholas [name] MRN: [id number] Date of Birth: 1945/05/03 Referring Provider:   Flowsheet Row Pulmonary Rehab from 12/12/2023 in [facility] Cardiac and Pulmonary Rehab  Referring Provider [name]    Encounter Date: 02/27/2024  Check In:  Session Check In - 02/27/24 1040       Check-In   Supervising physician immediately available to respond to emergencies See telemetry face sheet for immediately available ER MD    Location [facility]-Cardiac & Pulmonary Rehab    Staff Present Leita Franks RN,BSN;Joseph [facility] BS, Exercise Physiologist;Margaret Best, MS, Exercise Physiologist    Virtual Visit No    Medication changes reported     No    Fall or balance concerns reported    No    Warm-up and Cool-down Performed on first and last piece of equipment    Resistance Training Performed Yes    VAD Patient? No    PAD/SET Patient? No      Pain Assessment   Currently in Pain? No/denies             Tobacco Use History[1]  Goals Met:  Proper associated with RPD/PD & O2 Sat Independence with exercise equipment Using PLB without cueing & demonstrates good technique Exercise tolerated well No report of concerns or symptoms today Strength training completed today  Goals Unmet:  Not Applicable  Comments: Pt able to follow exercise prescription today without complaint.  Will continue to monitor for progression.    Dr. Oneil Pinal is Medical Director for [facility] Cardiac Rehabilitation.  Dr. Fuad Aleskerov is Medical Director for [facility] Pulmonary Rehabilitation.    [1]  Social History Tobacco Use  Smoking Status Never   Passive exposure: Past  Smokeless Tobacco Never

## 2024-03-03 ENCOUNTER — Encounter

## 2024-03-05 ENCOUNTER — Encounter

## 2024-03-07 ENCOUNTER — Ambulatory Visit: Admitting: Nurse Practitioner

## 2024-03-10 ENCOUNTER — Encounter

## 2024-03-12 ENCOUNTER — Encounter

## 2024-03-17 ENCOUNTER — Encounter

## 2024-03-19 ENCOUNTER — Encounter

## 2024-03-24 ENCOUNTER — Encounter

## 2024-03-26 ENCOUNTER — Encounter

## 2024-03-31 ENCOUNTER — Encounter

## 2024-04-02 ENCOUNTER — Encounter

## 2024-04-07 ENCOUNTER — Encounter

## 2024-04-07 ENCOUNTER — Ambulatory Visit: Admitting: Nurse Practitioner

## 2024-04-08 ENCOUNTER — Other Ambulatory Visit (HOSPITAL_COMMUNITY)

## 2024-04-09 ENCOUNTER — Encounter

## 2024-04-15 ENCOUNTER — Ambulatory Visit: Admitting: Physician Assistant

## 2024-06-17 ENCOUNTER — Ambulatory Visit
